# Patient Record
Sex: Female | Born: 1955 | Race: White | Hispanic: No | Marital: Single | Smoking: Never smoker
Health system: Southern US, Community
[De-identification: ages and names within clinical notes are randomized; demographics above are authoritative.]

## PROBLEM LIST (undated history)

## (undated) DIAGNOSIS — E781 Pure hyperglyceridemia: Secondary | ICD-10-CM

## (undated) DIAGNOSIS — K859 Acute pancreatitis without necrosis or infection, unspecified: Secondary | ICD-10-CM

## (undated) DIAGNOSIS — M1990 Unspecified osteoarthritis, unspecified site: Secondary | ICD-10-CM

## (undated) DIAGNOSIS — K922 Gastrointestinal hemorrhage, unspecified: Secondary | ICD-10-CM

## (undated) DIAGNOSIS — E785 Hyperlipidemia, unspecified: Secondary | ICD-10-CM

## (undated) DIAGNOSIS — I5032 Chronic diastolic (congestive) heart failure: Secondary | ICD-10-CM

## (undated) DIAGNOSIS — I10 Essential (primary) hypertension: Secondary | ICD-10-CM

## (undated) DIAGNOSIS — I4891 Unspecified atrial fibrillation: Secondary | ICD-10-CM

## (undated) HISTORY — DX: Unspecified osteoarthritis, unspecified site: M19.90

## (undated) HISTORY — DX: Pure hyperglyceridemia: E78.1

## (undated) HISTORY — DX: Acute pancreatitis without necrosis or infection, unspecified: K85.90

## (undated) HISTORY — DX: Unspecified atrial fibrillation: I48.91

## (undated) HISTORY — PX: LUNG SURGERY: SHX703

## (undated) SURGERY — Surgical Case
Anesthesia: *Unknown

---

## 2003-08-14 ENCOUNTER — Encounter: Admission: RE | Admit: 2003-08-14 | Discharge: 2003-08-14 | Payer: Self-pay | Admitting: Internal Medicine

## 2004-09-01 ENCOUNTER — Emergency Department (HOSPITAL_COMMUNITY): Admission: EM | Admit: 2004-09-01 | Discharge: 2004-09-01 | Payer: Self-pay | Admitting: Emergency Medicine

## 2004-10-03 ENCOUNTER — Encounter: Admission: RE | Admit: 2004-10-03 | Discharge: 2004-10-03 | Payer: Self-pay | Admitting: Internal Medicine

## 2005-03-24 ENCOUNTER — Inpatient Hospital Stay (HOSPITAL_COMMUNITY): Admission: AD | Admit: 2005-03-24 | Discharge: 2005-04-09 | Payer: Self-pay | Admitting: Internal Medicine

## 2005-03-24 ENCOUNTER — Ambulatory Visit: Payer: Self-pay | Admitting: Dentistry

## 2005-03-25 ENCOUNTER — Ambulatory Visit: Payer: Self-pay | Admitting: Critical Care Medicine

## 2005-03-27 ENCOUNTER — Encounter (INDEPENDENT_AMBULATORY_CARE_PROVIDER_SITE_OTHER): Payer: Self-pay | Admitting: Specialist

## 2005-03-31 ENCOUNTER — Encounter (INDEPENDENT_AMBULATORY_CARE_PROVIDER_SITE_OTHER): Payer: Self-pay | Admitting: *Deleted

## 2005-04-16 ENCOUNTER — Encounter: Admission: RE | Admit: 2005-04-16 | Discharge: 2005-04-16 | Payer: Self-pay | Admitting: Thoracic Surgery

## 2005-04-23 ENCOUNTER — Encounter: Admission: RE | Admit: 2005-04-23 | Discharge: 2005-04-23 | Payer: Self-pay | Admitting: Dentistry

## 2005-04-23 ENCOUNTER — Ambulatory Visit (HOSPITAL_COMMUNITY): Admission: RE | Admit: 2005-04-23 | Discharge: 2005-04-23 | Payer: Self-pay | Admitting: Dentistry

## 2005-04-29 ENCOUNTER — Ambulatory Visit: Payer: Self-pay | Admitting: Dentistry

## 2005-04-29 ENCOUNTER — Ambulatory Visit (HOSPITAL_COMMUNITY): Admission: RE | Admit: 2005-04-29 | Discharge: 2005-04-29 | Payer: Self-pay | Admitting: Dentistry

## 2005-05-26 ENCOUNTER — Ambulatory Visit: Payer: Self-pay | Admitting: Dentistry

## 2005-06-24 ENCOUNTER — Ambulatory Visit: Payer: Self-pay | Admitting: Dentistry

## 2005-07-24 ENCOUNTER — Ambulatory Visit: Payer: Self-pay | Admitting: Dentistry

## 2005-08-05 ENCOUNTER — Ambulatory Visit: Payer: Self-pay | Admitting: Dentistry

## 2005-12-29 ENCOUNTER — Encounter: Admission: RE | Admit: 2005-12-29 | Discharge: 2005-12-29 | Payer: Self-pay | Admitting: Internal Medicine

## 2007-01-04 ENCOUNTER — Encounter: Admission: RE | Admit: 2007-01-04 | Discharge: 2007-01-04 | Payer: Self-pay | Admitting: Internal Medicine

## 2008-01-10 ENCOUNTER — Encounter: Admission: RE | Admit: 2008-01-10 | Discharge: 2008-01-10 | Payer: Self-pay | Admitting: Internal Medicine

## 2009-01-22 ENCOUNTER — Encounter: Admission: RE | Admit: 2009-01-22 | Discharge: 2009-01-22 | Payer: Self-pay | Admitting: Internal Medicine

## 2010-01-28 ENCOUNTER — Encounter: Admission: RE | Admit: 2010-01-28 | Discharge: 2010-01-28 | Payer: Self-pay | Admitting: Internal Medicine

## 2010-07-06 ENCOUNTER — Encounter: Payer: Self-pay | Admitting: Thoracic Surgery

## 2010-11-01 NOTE — Op Note (Signed)
NAMEQUISHA, MABIE               ACCOUNT NO.:  [id number]   MEDICAL RECORD NO.:  [id number]          PATIENT TYPE:  INP   LOCATION:  2020                         FACILITY:  [facility]   PHYSICIAN:  Coralyn Helling, M.D.      DATE OF BIRTH:  [birth year]   DATE OF PROCEDURE:  03/27/2005  DATE OF DISCHARGE:                                 OPERATIVE REPORT   PROCEDURE PERFORMED:  Bronchoscopy.   INDICATIONS FOR THE PROCEDURE:  Lung abscess and empyema.  Rule out  endobronchial lesion.   POSTPROCEDURE DIAGNOSES:  1.  Lung abscess.  2.  Empyema.  3.  No endobronchial lesions seen.   PULMONOLOGIST:  Coralyn Helling, M.D.   DESCRIPTION OF THE PROCEDURE:  The patient was given 4 mg of IV Versed and  50 mg of fentanyl for sedation.  She was given viscous lidocaine for the  nasopharynx and Cetacaine spray for her oropharynx anesthesia.   The bronchoscope was entered through the left naris.  The vocal cords were  visualized and had good mobility.  Four milliliters of lidocaine was  instilled and then the trachea was entered.  The mucosa appeared normal and  no endobronchial lesions were seen.  The mediastinum was visualized and  appeared normal in size.  The left main bronchus was then entered.  The left  upper lingula and lower lobes were visualized.  The mucosa appeared normal  and no endobronchial lesions were seen.   The bronchoscope was then withdrawn and the right main bronchus was then  entered.  The right upper and right middle lobes were visualized, and no  endobronchial lesions were seen.  The mucosa appeared normal.  The right  lower lobe was then visualized and again, there were no endobronchial  lesions seen.  The mucosa appeared normal.  Bronchoalveolar lavage was done  from the right lower lobe as well as cytology brush; and, these specimens  will be sent for microbiology cultures.   There was no bleeding seen.   The patient tolerated the procedure well.   COMPLICATIONS:  There  were no immediate complications.   DISPOSITION:  The patient was taken to the recovery room in good condition.      Coralyn Helling, M.D.  Electronically Signed     VS/MEDQ  D:  03/27/2005  T:  03/28/2005  Job:  [id number]

## 2010-11-01 NOTE — Op Note (Signed)
[facility], [facility] NO.:  [id number]   MEDICAL RECORD NO.:  [id number]           PATIENT TYPE:   LOCATION:                                 FACILITY:   PHYSICIAN:  Charlynne Pander, D.D.S.DATE OF BIRTH:  1955-06-26   DATE OF PROCEDURE:  04/29/2005  DATE OF DISCHARGE:                                 OPERATIVE REPORT   PREOPERATIVE DIAGNOSES:  1.  History of lung abscess.  2.  Chronic apical periodontitis.  3.  Chronic periodontitis.  4.  Accretions.  5.  Multiple retained root segments.   POSTOPERATIVE DIAGNOSES:  1.  History of lung abscess.  2.  Chronic apical periodontitis.  3.  Chronic periodontitis.  4.  Accretions.  5.  Multiple retained root segments.  6.  Bilateral Excessive Maxillary Tuberosities.   OPERATIONS:  1.  Extraction of remaining teeth numbers 2, 3, 4, 5, 6, 7, 8, 9, 11, 12,      13, 14, 15, 17, 20, 21, 29 and 32.  2.  Four quadrants of alveoplasty.  3.  Bilateral maxillary osseous tuberosity reductions.  4.  Gross debridement of remaining dentition.   SURGEON:  Charlynne Pander, D.D.S.   ASSISTANT:  Emily Farmer (Sales executive).   ANESTHESIA:  General anesthesia via nasoendotracheal tube; Dr. Gypsy Balsam  attending.   MEDICATIONS:  1.  Ampicillin 2.0 gm IV prior to invasive dental procedures.  2.  Local anesthesia with a total utilization of 5 carpules, each,      containing 36 mg of Xylocaine with 0.018 mg of epinephrine; as well as 2      carpules, each, containing 9 mg of bupivacaine with 0.009 mg of      epinephrine.   SPECIMENS:  There were 18 teeth which were discarded.   CULTURES:  None.   DRAINS:  None.   COMPLICATIONS:  None.   ESTIMATED BLOOD LOSS:  Less than 100 mL.   FLUIDS:  900 mL of lactated Ringers solution.   INDICATIONS:  The patient had a history of lung abscess. Dental consultation  requested to rule out dental infection which may be causing the lung  abscess. The patient was examined and  treatment planned for multiple  extractions with alveoloplasty with gross debridement of remaining  dentition. This treatment plan was formulated to decrease the risk and  complications associated with dental infection from an affecting the  patient's systemic health.   OPERATIVE FINDINGS:  The patient was examined in operating room #2. The  teeth were identified for extraction. The patient is noted to be affected by  chronic apical periodontitis, chronic periodontitis, multiple retained root  segments, bilateral excessive maxillary osseous tuberosities. The  aforementioned, necessitated the removal of multiple teeth with  alveoloplasty, bilateral maxillary osseous tuberosity reductions, and gross  debridement of the remaining teeth.   DESCRIPTION OF PROCEDURE:  The patient was brought to the main operating  room #2. The patient was then placed in the supine position on the operating  room table. General anesthesia was induced, per the anesthesia team, with a  nasoendotracheal tube. The patient was  then prepped and draped in usual  manner for a dental medicine procedure. A throat pack was placed at this  time. The oral cavity was thoroughly examined with the findings as noted  above. The patient was then ready for the oral surgical procedure as  follows:   Local anesthesia was administered sequentially over the 2-hour long  procedure with the total utilization of 5 carpules,  each containing, 36 mg  of Xylocaine with 0.018 mg of epinephrine; as well as 2 carpules, each  containing, 9 mg of bupivacaine with 0.009 mg of epinephrine.   The maxillary quadrants were first approached. A 15-blade incision was made  from the distal of the maxillary right tuberosity, extended to the mesial of  #11.  A surgical flap was then carefully reflected. The upper teeth were  then subluxated with a series of straight elevators. Appropriate amounts of  buccal and interseptal bone was removed. Tooth  numbers 2 and 3 were then  removed with the 53-R forceps without complications. Tooth numbers 4, 5, and  6 were then removed with the 150 forceps without complications.  Appropriate  amounts of buccal and interseptal bone were removed around tooth numbers 7,  8, and 9 and 11. These teeth were then resublaxated and removed with the 150  forceps without complications. Alveoloplasty was then performed utilizing  rongeurs and bone file. An osseous tuberosity reduction was then achieved  utilizing the rongeurs and bone file appropriately. A 15-blade was used to  remove the excessive hyperplastic tissues. The tissues were then further  approximated and trimmed appropriately. Each surgical site was then  irrigated with copious amounts of sterile saline. The surgical site was then  closed, from the axillary right tuberosity and they extended to the mesial  of number 8, utilizing 3-0 chromic gut suture in a continuous interrupted  suture technique x1.   The maxillary left quadrant was then approached, a 15-blade incision was  made from the maxillary left tuberosity and extended to the mesial of #11.  A surgical flap was then further reflected. Appropriate amounts of buccal  and interseptal bone were removed around the remaining teeth. These teeth  were then subluxated and tooth #'s [phone] were removed with a 150  forceps without complications. Alveoloplasty was then performed utilizing  rongeurs and bone file. Further osseous tuberosity reduction was then  achieved with a rongeurs and bone file. The surgical site was then irrigated  with copious amounts of sterile saline. The excessive hyperplastic tissues  were then removed with a 15-blade incision and soft tissue pickups. These  tissues were then further approximated and trimmed appropriately. The  surgical site was then further irrigated with copious amounts of sterile saline. The surgical site was then closed from the maxillary left  tuberosity  and extended to the mesial of #9 utilizing 3-0 chromic gut suture in a  continuous interrupted suture technique x1.   At this point in time, the remaining mandibular teeth were then approached;  bilateral inferior alveolar nerve blocks were given utilizing the  bupivacaine. Further infiltration was achieved appropriately. A 15-blade  incision was made from the distal of #17 and extended to the mesial of #22.  A surgical flap was then carefully reflected. Appropriate amounts of buccal  and interseptal bone were removed around tooth number 17, 20, and 21  appropriately. Tooth #17 was then removed with a 151 forceps without  complications. Tooth numbers 20 and 21 were then removed with a 151 forceps  without complications.  Alveoloplasty was then performed utilizing rongeurs  and bone file. The surgical site was then irrigated with copious amounts of  sterile saline. Soft tissues were trimmed appropriately. The Surgical site  was then closed from the distal of #17 and extended to the distal of #22  utilizing 3-0 chromic gut suture in a continuous interrupted suture  technique x1.   At this point in time, the mandibular right quadrant was approached; a 15-  blade incision was made from distal #32 and extended to the distal of #27.  A surgical flap was then carefully reflected. Appropriate amounts of  interseptal and buccal bone were removed around tooth numbers 29 and 32  appropriately. These teeth were then subluxated and then removed with a 151  forceps without complications. Alveoloplasty was then performed utilizing  rongeurs and bone file. The surgical site was then irrigated with copious  amounts of sterile saline. The soft tissues were approximated and trimmed  appropriately. Surgical site was then closed from the distal of #32 and  extended to the distal of #27 utilizing 3-0 chromic gut suture in a  continuous interrupted suture technique x1.   At this point in time, the  remaining teeth were then approached. A KaVo  sonic scaler was then utilized to remove the significant accretions around  the remaining teeth. A series of hand curettes were then utilized to remove  further accretions. The KaVo sonic scaler was then, again, utilized to  refine and remove the bone accretions. At this point in time, the entire  mouth was irrigated with copious amounts of sterile saline. The patient was  examined for complications, seeing none, the dental medicine procedure was  deemed to be complete. The throat pack was removed at this time. A series of  4x4 gauzes and an oral airway were placed in the mouth, at the request of  the anesthesia team.   The patient was then handed over to the anesthesia team for final  disposition. After the appropriate amount of time the patient was extubated  and taken to the post anesthesia care unit with stable vital signs and a good oxygenation level. All counts were correct for the dental medicine  procedure. The patient will be given appropriate pain medication; and then  will return to clinic in approximately 1 week for evaluation of healing and  suture removal.      Charlynne Pander, D.D.S.  Electronically Signed     RFK/MEDQ  D:  04/29/2005  T:  04/29/2005  Job:  [id number]   cc:   Fleet Contras, M.D.  Fax: [phone]

## 2010-11-01 NOTE — H&P (Signed)
NAMEBRITTIANY, WIEHE               ACCOUNT NO.:  [id number]   MEDICAL RECORD NO.:  [id number]           PATIENT TYPE:   LOCATION:                                 FACILITY:   PHYSICIAN:  Emily Farmer, M.D.    DATE OF BIRTH:  Sep 25, 1955   DATE OF ADMISSION:  03/24/2005  DATE OF DISCHARGE:                                HISTORY & PHYSICAL   HISTORY OF PRESENT ILLNESS:  [name] is a 55 year old Caucasian lady with  past medical history significant for type 2 diabetes mellitus, allergic  rhinitis, and hyperlipidemia.  She was seen in the office of [facility] about a week ago with complaints of cough productive of clear sputum  that was initially dry, associated with some shortness of breath, headaches,  fevers and chills, as well as cold symptoms.  On evaluation in the office  she was noted to be in no acute respiratory or painful distress.  She did  have some scattered rhonchi in her right lung.  A chest x-ray at that time  showed right-sided lower lobe infiltrates.  The patient was advised to be  hospitalized for treatment of pneumonia in view of her comorbid condition of  diabetes.  She declined this request stating that she has a young child with  autism at home to take care of and would rather be treated as an outpatient.  She was therefore started on Levaquin 750 mg one p.o. daily and Zithromax  250 mg to take two the first day and then one everyday for 5 days.  She was  also given Tussionex 5cc q.12 hours for the cough as well as Albuterol MDI  two puffs q.6 hours p.r.n. for shortness of breath.  She was, however,  advised to attend the emergency room if symptoms were to worsen.  I also  scheduled her for a CT scan of her chest to rule out any complications of  this pneumonia and she attended the [facility] for  CT scan this morning and I have been informed by the reading radiologist,  Dr. Dagoberto Farmer that she has a complex pneumonia of the  right lung  associated with some pleural effusion, possibly lung abscess.  I called her  at home this morning and advised her to be hospitalized immediately.  I am  getting a bed available for her at [facility] to be hospitalized  and started on intravenous antibiotics and for further evaluation.   PAST MEDICAL HISTORY:  1.  Type 2 diabetes mellitus.  Her last hemoglobin A1C in June of 2006 was      12.2.  2.  Dyslipidemia.  3.  Allergic rhinitis.  4.  Chronic low back pain.   MEDICATIONS:  1.  Amaryl 4 mg one p.o. b.i.d.  2.  Zyrtec 10 mg daily.  3.  Avecor 20/500 one p.o. b.i.d.  4.  Avandia 40 mg one p.o. b.i.d.  5.  Fortamet 1000 mg one p.o. daily.  6.  Albuterol MDI two puffs q.6 hours p.r.n.  7.  Nasacort AQ one to  two sprays each nostril once a day.  8.  Lantus Insulin 20 units at bedtime.   ALLERGIES:  No known drug allergies.   PAST SURGICAL HISTORY:  Noncontributory.   FAMILY HISTORY:  Her father is alive and well.  Her mother is deceased of  unknown origin.  She has three siblings in good health and one son with  autism.  She denies any use of alcohol, tobacco, or illicit drugs.   REVIEW OF SYSTEMS:  CNS: She denies any headaches, dizziness, blurring of  vision or weakness of extremities.  CVS:  She denies any chest pain,  orthopnea, PND, or palpitations.  GASTROINTESTINAL:  She has no dysphagia,  abdominal pain, heartburn, vomiting, diarrhea, or constipation.  GENITOURINARY:  She has no dysuria, frequency, or hematuria.  MUSCULOSKELETAL:  She does have some aches and pains nonspecific, but no  joint stiffness, joint swelling, or muscle cramps.   PHYSICAL EXAMINATION:  GENERAL:  She is overweight.  She is not in acute  respiratory or painful distress.  VITAL SIGNS:  Blood pressure 130/80, heart rate 96 regular, respiratory rate  15, temperature 98.8.  She weighed 175 pounds with a height of 5 feet 3  inches.  HEENT:  She is normocephalic and  atraumatic.  NECK:  Supple with no elevated JVD, no cervical lymphadenopathy.  CHEST:  Reduced air entry in the right lower lung with few adventitious  sounds.  There are no wheezes or rhonchi.  HEART:  Sounds 1 and 2 are heard with no murmurs, no S3 gallops.  ABDOMEN:  Obese, soft, nontender, no masses.  Bowel sounds are present.  EXTREMITIES:  No edema.  No calf tenderness or swelling.  NEUROLOGY:  Alert and oriented x2 with no focal neurological deficits.   ASSESSMENT:  The patient is a 55 year old Caucasian lady with past medical  history significant for type 2 diabetes mellitus and dyslipidemia.  She  presented about a week ago with right-sided pneumonia which was being  treated in the outpatient setting, but based on the report of a CT scan  performed this morning, it shows nonresolution of her pneumonia and possible  complications with possible lung abscess/pleural effusion.  She is therefore  being hospitalized for intravenous antibiotic therapy and a repeat scan at  the hospital to evaluate these complications.   ADMISSION DIAGNOSES:  1.  Right-sided lung pneumonia, rule out lung abscess.  2.  Type 2 diabetes mellitus, poorly controlled.   PLAN:  She will be admitted to a medical bed.  She will be placed on a 2  gram sodium, carbohydrate-modified diet.  Vital signs q.4 hours.  CBGs a.c.  and h.s.  Further laboratory data will include a CBC, CMET, urinalysis, EKG,  chest x-ray.  PPD test is being done for rule out tuberculosis, sputum for  cultures and sensitivities, and AFB also.  She will be started on  intravenous half normal saline at 50 mL an hour, IV Zosyn 3.375 grams q.6  hours, sliding scale NovoLog Insulin.  Oral medications will be continued  except for Metformin which will be put on hold.  She will be continued on  Lantus Insulin 20 units at bedtime. This plan of care has been explained to  her and all of her questions have been answered.      Emily [facility],  M.D. Electronically Signed     EA/MEDQ  D:  03/24/2005  T:  03/24/2005  Job:  [id number]

## 2010-11-01 NOTE — Op Note (Signed)
[facility], Emily Farmer               ACCOUNT NO.:  [id number]   MEDICAL RECORD NO.:  [id number]          PATIENT TYPE:  INP   LOCATION:  2550                         FACILITY:  [facility]   PHYSICIAN:  Ines Bloomer, M.D. DATE OF BIRTH:  1955/06/18   DATE OF PROCEDURE:  03/31/2005  DATE OF DISCHARGE:                                 OPERATIVE REPORT   PREOPERATIVE DIAGNOSIS:  Right lower lobe abscess with empyema.   POSTOPERATIVE DIAGNOSIS:  Right lower lobe abscess with empyema.   OPERATION PERFORMED:  Right video assisted thoracoscopic surgery with  decortication and resection of right lower lobe abscess.   SURGEON:  Ines Bloomer, M.D.   ASSISTANT:  Stephanie Acre Dominick, PA   ANESTHESIA:  General.   DESCRIPTION OF PROCEDURE:  After percutaneous insertion of all monitoring  lines, the patient underwent general anesthesia, he was turned to the right  lateral thoracotomy position, was prepped and draped in the usual sterile  manner.  A dual lumen tube was inserted and the right lung was deflated.  Two trocar sites were made in the anterior and posterior and posterior  axillary lines at the seventh intercostal space.  Two trocars were inserted.  There was a marked amount of adhesions there and we had to take down the  adhesions with sharp and blunt dissection in order to get the trocars in.  A  0 degree scope was inserted and there was evidence of an empyema in the  posterior and lateral and posterolateral area.  The lung was taken off the  chest wall with sharp and blunt dissection.  Obviously, it was really stuck,  particularly in the area where the abscess was.  The abscess was entered and  cultures were taken from the abscess.  It was decided to do a posterolateral  thoracotomy over the sixth intercostal space.  The latissimus was partially  divided.  The serratus was reflected anteriorly.  The sixth intercostal  space was entered and the lung was taken off the chest wall both  laterally  and then medially. It was very stuck right at the major fissure.  It was  taken off the diaphragm.  It was also stuck on the diaphragm and we had to  take part of it off using the EZ-45 stapler.  Then it was dissected.  The  middle lobe was also dissected off the diaphragm and the pericardium  medially.  Then the scope was reinserted and we were able to free the lung  up superiorly completely freeing up the left upper lobe.  After all this had  been done, attention was turned to the anterior segment of the right middle  lobe and a decortication was done, stripping off the thickened pleura off  the lung.  Then in the superior segment, the area of the abscess, the  abscess was unroofed and partially resected with the EZ-45 stapler and any  bleeding was oversewn with 3-0 Vicryl.  After all the decortication had been  completed.  A Marcaine block was done in the usual fashion. The On-Q  catheter was inserted in the  usual fashion inserting it in the subpleural  area using the tunneler under direct vision with the scope.  After it had  been secured in place with Steri-Strips, two straight 28 chest tubes were  brought in through the trocar sites and a third chest tube and a right  angled chest tube were placed in the right posterior  sulcus and sutured in place with 0 silk.  The chest was closed with three  pericostals, #1 Vicryl in the muscle layer, 2-0 Vicryl in the subcutaneous  tissue and Ethicon skin clips.  The patient was returned to the recovery  room in stable condition.           ______________________________  Ines Bloomer, M.D.     DPB/MEDQ  D:  03/31/2005  T:  03/31/2005  Job:  [id number]   cc:   Shan Levans, M.D. [facility]  [address]  [place]  [place] [address]
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
## 2010-11-01 NOTE — Discharge Summary (Signed)
Emily Farmer, Emily Farmer NO.:  [id number]   MEDICAL RECORD NO.:  [id number]          PATIENT TYPE:  INP   LOCATION:  2032                         FACILITY:  [facility]   PHYSICIAN:  Fleet Contras, M.D.    DATE OF BIRTH:  March 24, 1956   DATE OF ADMISSION:  03/24/2005  DATE OF DISCHARGE:  04/09/2005                                 DISCHARGE SUMMARY   HISTORY OF PRESENT ILLNESS:  Emily Farmer is a 55 year old Caucasian lady with  past medical history significant for type 2 diabetes mellitus.  She was  admitted directly from home after she had an episode of pneumonia in the  right lower lobe which was treated in the outpatient, but a CT scan of the  chest performed at [facility] showed right-sided lung abscess/effusion  and she was thought to be unstable for outpatient management.  She had been  treated in the outpatient with oral Levaquin 750 mg b.i.d. for a week as  well as Azithromycin 500 mg for day #1 and 250 mg daily for four more days.  She did still have some cough with slightly scanty yellow sputum but she did  not have any fever or chills.  She denied any chest pain, shortness of  breath or hemoptysis.   HOSPITAL COURSE:  On admission to the hospital, patient's vital signs were  stable.  She did not have any leukocytosis.  A repeat chest x-ray did  confirm right lower lobe opacities.  A pulmonary consult was requested and  patient was seen by Dr. Shan Levans who suggested that the patient needed  to have bronchoscopy/thoracotomy with decortication of the abscess.  He  arranged for a cardiothoracic surgery consult.  Patient was seen by Dr.  Edwyna Shell who suggested that patient should be given a good trial of  antibiotics and also have a preceding bronchoscopy before thoracotomy.  Patient was started on intravenous antibiotics with Zosyn 4.5 g q.6h.  This  she continued for about 14 days.  In the meantime, she underwent  bronchoscopy performed by Dr. Craige Cotta which did not  reveal any intrabronchial  lesions or obstructions.  Subsequently repeat chest x-ray showed persistent  right lower lobe consolidation with empyema and effusion.  Patient,  therefore, underwent video assisted thoracotomy, decortication an chest tube  drainage performed on March 31, 2005.  This, she tolerated well.  She was  subsequently transferred to step-down unit.  The chest tube was gradually  removed by Dr. Edwyna Shell over the course of a week.  Pain management was  maintained with PCA pump which the patient tolerated.  Prior to this  hospitalization, patient was on insulin, Lantus, which was adjusted to  maintain her CBGs as well as sliding scale NovoLog insulin.  She has  continued to make improvement.  Cultures obtained prior to surgery and after  surgery for bacteria, fungus, acid-fast bacilli all returned negative.  Pathology of the surgery did show acute fibrinous pleuritis with underlying  lung tissue and acute organizing pneumonia.  Repeat chest x-ray performed  April 08, 2005, showed stable postoperative changes on the right with  some  volume loss and no significant change in the right pleural parenchyma  opacities.  Today, patient is sitting up in bed, not in acute respiratory or  painful distress.  She is not pale.  She is not icteric.  She is not  cyanotic.  Vital signs show blood pressure 116/68, heart rate 91,  temperature 97.9, O2 saturation on room air at 93%, respirations 20.  Her  neck is supple with elevated JVD.  Chest shows reduced air entry on the  right lower lung with no wheezing, rhonchi or rales.  Abdomen is soft and  nontender.  Bowel sounds are present.  Extremities show no edema or calf  tenderness or swelling.   RECENT LABORATORY DATA:  CBC of April 07, 2005, showed white count 7.8,  hemoglobin 8.8, hematocrit 26.1, platelet count of 324.  Sodium 138,  potassium 3.5, chloride 102, bicarbonate 27, BUN of 2, creatinine 0.5,  glucose 110.   ASSESSMENT:   Emily Farmer is a 55 year old Caucasian lady admitted directly from  home to the hospital for right lower lobe pneumonia complicated by lung  abscess and pleural effusion.  She has undergone bronchoscopy which was  negative for obstructive bronchial lesion.  She also underwent thoracotomy  with chest tube drainage after decortication of the abscess collection.  She  has had an uneventful postoperative recovery and her condition remains  stable.  She is now on oral antibiotics and her pain control is with oral  agents.  She is considered stable for discharge home today.   DISCHARGE DIAGNOSES:  1.  Right lower lobe pneumonia.  2.  Right lower lung abscess.  3.  Right-sided pleural effusion.  4.  Type 2 diabetes mellitus.  5.  Dental caries.  Please note that patient was seen by Dr. Kristin Bruins,      dentist, for evaluation of her dental caries and he has agreed that      patient is to be followed up as outpatient with an orthopantogram and      further treatment.   DISCHARGE MEDICATIONS:  1.  Augmentin 875 mg b.i.d. for a few more days.  2.  Lantus insulin 25 units nightly.  3.  Amaryl 4 mg daily.  4.  Percocet 5/325 mg one q.6h. p.r.n.   This plan of care has been discussed with her and all of her questions have  been answered.   FOLLOW UP:  With me in one week and also with the dentist in one to two  weeks.      Fleet Contras, M.D.  Electronically Signed     EA/MEDQ  D:  04/09/2005  T:  04/09/2005  Job:  [id number]

## 2011-06-19 ENCOUNTER — Inpatient Hospital Stay (HOSPITAL_COMMUNITY)
Admission: EM | Admit: 2011-06-19 | Discharge: 2011-06-22 | DRG: 638 | Disposition: A | Discharge status: Home / Self Care | Payer: Medicaid Other | Source: Ambulatory Visit | Attending: Internal Medicine | Admitting: Internal Medicine

## 2011-06-19 ENCOUNTER — Inpatient Hospital Stay (HOSPITAL_COMMUNITY): Payer: Medicaid Other

## 2011-06-19 ENCOUNTER — Encounter: Payer: Self-pay | Admitting: Emergency Medicine

## 2011-06-19 DIAGNOSIS — N390 Urinary tract infection, site not specified: Secondary | ICD-10-CM | POA: Diagnosis present

## 2011-06-19 DIAGNOSIS — E1110 Type 2 diabetes mellitus with ketoacidosis without coma: Secondary | ICD-10-CM

## 2011-06-19 DIAGNOSIS — Z794 Long term (current) use of insulin: Secondary | ICD-10-CM

## 2011-06-19 DIAGNOSIS — E871 Hypo-osmolality and hyponatremia: Secondary | ICD-10-CM | POA: Diagnosis present

## 2011-06-19 DIAGNOSIS — I10 Essential (primary) hypertension: Secondary | ICD-10-CM | POA: Diagnosis present

## 2011-06-19 DIAGNOSIS — E1010 Type 1 diabetes mellitus with ketoacidosis without coma: Principal | ICD-10-CM | POA: Diagnosis present

## 2011-06-19 DIAGNOSIS — M109 Gout, unspecified: Secondary | ICD-10-CM | POA: Diagnosis present

## 2011-06-19 HISTORY — DX: Essential (primary) hypertension: I10

## 2011-06-19 LAB — GLUCOSE, CAPILLARY
Glucose-Capillary: 475 mg/dL — ABNORMAL HIGH (ref 70–99)
Glucose-Capillary: 355 mg/dL — ABNORMAL HIGH (ref 70–99)
Glucose-Capillary: 312 mg/dL — ABNORMAL HIGH (ref 70–99)

## 2011-06-19 LAB — URINALYSIS, ROUTINE W REFLEX MICROSCOPIC
Bilirubin Urine: NEGATIVE
Glucose, UA: >1000 mg/dL — AB
Specific Gravity, Urine: 1.029 (ref 1.005–1.030)
Urobilinogen, UA: 0.2 mg/dL (ref 0.0–1.0)
pH: 5.5 (ref 5.0–8.0)

## 2011-06-19 LAB — CBC
HCT: 35 % — ABNORMAL LOW (ref 36.0–46.0)
Hemoglobin: 11.4 g/dL — ABNORMAL LOW (ref 12.0–15.0)
MCH: 27.3 pg (ref 26.0–34.0)
MCHC: 32.6 g/dL (ref 30.0–36.0)
MCV: 83.7 fL (ref 78.0–100.0)
Platelets: 163 10*3/uL (ref 150–400)
RBC: 4.46 MIL/uL (ref 3.87–5.11)
WBC: 10.3 10*3/uL (ref 4.0–10.5)
WBC: 8.9 10*3/uL (ref 4.0–10.5)

## 2011-06-19 LAB — DIFFERENTIAL
Eosinophils Absolute: 0 10*3/uL (ref 0.0–0.7)
Lymphocytes Relative: 6 % — ABNORMAL LOW (ref 12–46)
Lymphs Abs: 0.6 10*3/uL — ABNORMAL LOW (ref 0.7–4.0)
Neutrophils Relative %: 78 % — ABNORMAL HIGH (ref 43–77)

## 2011-06-19 LAB — COMPREHENSIVE METABOLIC PANEL
ALT: 22 U/L (ref 0–35)
AST: 31 U/L (ref 0–37)
Alkaline Phosphatase: 106 U/L (ref 39–117)
CO2: 11 mEq/L — ABNORMAL LOW (ref 19–32)
GFR calc Af Amer: >90 mL/min (ref >90)
Glucose, Bld: 514 mg/dL — ABNORMAL HIGH (ref 70–99)
Potassium: 3.8 mEq/L (ref 3.5–5.1)
Sodium: 126 mEq/L — ABNORMAL LOW (ref 135–145)
Total Protein: 7.5 g/dL (ref 6.0–8.3)

## 2011-06-19 LAB — CARDIAC PANEL(CRET KIN+CKTOT+MB+TROPI)
CK, MB: 2.2 ng/mL (ref 0.3–4.0)
Relative Index: INVALID (ref 0.0–2.5)
Total CK: 59 U/L (ref 7–177)
Troponin I: <0.3 ng/mL (ref <0.30)

## 2011-06-19 LAB — POCT I-STAT 3, VENOUS BLOOD GAS (G3P V)
Acid-base deficit: 12 mmol/L — ABNORMAL HIGH (ref 0.0–2.0)
pH, Ven: 7.336 — ABNORMAL HIGH (ref 7.250–7.300)

## 2011-06-19 LAB — MAGNESIUM: Magnesium: 1.4 mg/dL — ABNORMAL LOW (ref 1.5–2.5)

## 2011-06-19 LAB — URINE MICROSCOPIC-ADD ON

## 2011-06-19 LAB — PHOSPHORUS: Phosphorus: 1.7 mg/dL — ABNORMAL LOW (ref 2.3–4.6)

## 2011-06-19 MED ORDER — ACETAMINOPHEN 325 MG PO TABS
650.0000 mg | ORAL_TABLET | Freq: Four times a day (QID) | ORAL | Status: DC | PRN
  Administered 2011-06-20 – 2011-06-22 (×7): 650 mg via ORAL
  Filled 2011-06-19 (×7): qty 2

## 2011-06-19 MED ORDER — SODIUM CHLORIDE 0.9 % IV BOLUS (SEPSIS)
1000.0000 mL | Freq: Once | INTRAVENOUS | Status: AC
  Administered 2011-06-19: 1000 mL via INTRAVENOUS

## 2011-06-19 MED ORDER — ALUM & MAG HYDROXIDE-SIMETH 200-200-20 MG/5ML PO SUSP
30.0000 mL | Freq: Four times a day (QID) | ORAL | Status: DC | PRN
  Administered 2011-06-20: 30 mL via ORAL
  Filled 2011-06-19 (×2): qty 30

## 2011-06-19 MED ORDER — ONDANSETRON HCL 4 MG/2ML IJ SOLN: 4.0000 mg | Freq: Four times a day (QID) | INTRAMUSCULAR | Status: DC | PRN

## 2011-06-19 MED ORDER — SODIUM CHLORIDE 0.9 % IV SOLN
INTRAVENOUS | Status: DC
  Filled 2011-06-19: qty 1

## 2011-06-19 MED ORDER — MORPHINE SULFATE 2 MG/ML IJ SOLN: 1.0000 mg | INTRAMUSCULAR | Status: DC | PRN

## 2011-06-19 MED ORDER — HYDROCODONE-ACETAMINOPHEN 5-325 MG PO TABS: 1.0000 | ORAL_TABLET | ORAL | Status: DC | PRN

## 2011-06-19 MED ORDER — DOCUSATE SODIUM 100 MG PO CAPS
100.0000 mg | ORAL_CAPSULE | Freq: Two times a day (BID) | ORAL | Status: DC
  Administered 2011-06-20 – 2011-06-22 (×6): 100 mg via ORAL
  Filled 2011-06-19 (×7): qty 1

## 2011-06-19 MED ORDER — INSULIN REGULAR BOLUS VIA INFUSION
0.0000 [IU] | Freq: Three times a day (TID) | INTRAVENOUS | Status: DC
  Filled 2011-06-19 (×5): qty 10

## 2011-06-19 MED ORDER — ACETAMINOPHEN 650 MG RE SUPP: 650.0000 mg | Freq: Four times a day (QID) | RECTAL | Status: DC | PRN

## 2011-06-19 MED ORDER — ZOLPIDEM TARTRATE 5 MG PO TABS: 5.0000 mg | ORAL_TABLET | Freq: Every evening | ORAL | Status: DC | PRN

## 2011-06-19 MED ORDER — SODIUM CHLORIDE 0.9 % IV SOLN
INTRAVENOUS | Status: DC
  Administered 2011-06-19 – 2011-06-20 (×2): via INTRAVENOUS

## 2011-06-19 MED ORDER — SODIUM CHLORIDE 0.9 % IV SOLN: INTRAVENOUS | Status: DC

## 2011-06-19 MED ORDER — ENOXAPARIN SODIUM 40 MG/0.4ML ~~LOC~~ SOLN
40.0000 mg | Freq: Every day | SUBCUTANEOUS | Status: DC
  Administered 2011-06-20 – 2011-06-21 (×3): 40 mg via SUBCUTANEOUS
  Filled 2011-06-19 (×5): qty 0.4

## 2011-06-19 MED ORDER — SODIUM CHLORIDE 0.9 % IV SOLN
INTRAVENOUS | Status: DC
  Administered 2011-06-19: 3 [IU]/h via INTRAVENOUS
  Filled 2011-06-19 (×2): qty 1

## 2011-06-19 MED ORDER — DEXTROSE 50 % IV SOLN: 25.0000 mL | INTRAVENOUS | Status: DC | PRN

## 2011-06-19 MED ORDER — ONDANSETRON HCL 4 MG PO TABS: 4.0000 mg | ORAL_TABLET | Freq: Four times a day (QID) | ORAL | Status: DC | PRN

## 2011-06-19 NOTE — ED Provider Notes (Signed)
Medical screening examination/treatment/procedure(s) were conducted as a shared visit with non-physician practitioner(s) and myself.  I personally evaluated the patient during the encounter   Glynn Octave, MD 06/19/11 Rickey Primus

## 2011-06-19 NOTE — H&P (Signed)
Emily Farmer is an 56 y.o. female.   Chief Complaint: Abdominal pain and dysuria HPI: this is a very pleasant lady who appears older than stated age who was diagnosed with DM two years ago.  She was started on insulin right away and says that her sugars are usually well controlled.  This am she says she had some dysuria and abdominal pain that were sudden and she initially thought that she had a bladder infection but then those symptoms disappeared and she became nauseous but did not vomit.  Came in to get evaluated  Past Medical History  Diagnosis Date  . Diabetes mellitus   . Hypertension   . Gout     Past Surgical History  Procedure Date  . Lung surgery     No family history on file. Social History:  reports that she has never smoked. She does not have any smokeless tobacco history on file. She reports that she does not drink alcohol. Her drug history not on file.  Allergies: No Known Allergies  Medications Prior to Admission  Medication Dose Route Frequency Provider Last Rate Last Dose  . 0.9 %  sodium chloride infusion   Intravenous Continuous Lisette Paz, PA      . dextrose 50 % solution 25 mL  25 mL Intravenous PRN Lisette Paz, PA      . insulin regular (NOVOLIN R,HUMULIN R) 1 Units/mL in sodium chloride 0.9 % 100 mL infusion   Intravenous Continuous Lisette Paz, PA 3 mL/hr at 06/19/11 1819 3 Units/hr at 06/19/11 1819  . insulin regular bolus via infusion 0-10 Units  0-10 Units Intravenous TID WC Lisette Paz, [place]      . sodium chloride 0.9 % bolus 1,000 mL  1,000 mL Intravenous Once Glynn Octave, MD   1,000 mL at 06/19/11 1522  . sodium chloride 0.9 % bolus 1,000 mL  1,000 mL Intravenous Once Lisette Paz, PA   1,000 mL at 06/19/11 1618   No current outpatient prescriptions on file as of 06/19/2011.    Results for orders placed during the hospital encounter of 06/19/11 (from the past 48 hour(s))  CBC     Status: Normal   Collection Time   06/19/11  2:43 PM      Component  Value Range Comment   WBC 10.3  4.0 - 10.5 (K/uL)    RBC 4.46  3.87 - 5.11 (MIL/uL)    Hemoglobin 12.8  12.0 - 15.0 (g/dL)    HCT 16.1  09.6 - 04.5 (%)    MCV 83.4  78.0 - 100.0 (fL)    MCH 28.7  26.0 - 34.0 (pg)    MCHC 34.4  30.0 - 36.0 (g/dL)    RDW 40.9  81.1 - 91.4 (%)    Platelets 163  150 - 400 (K/uL)   DIFFERENTIAL     Status: Abnormal   Collection Time   06/19/11  2:43 PM      Component Value Range Comment   Neutrophils Relative 78 (*) 43 - 77 (%)    Neutro Abs 8.0 (*) 1.7 - 7.7 (K/uL)    Lymphocytes Relative 6 (*) 12 - 46 (%)    Lymphs Abs 0.6 (*) 0.7 - 4.0 (K/uL)    Monocytes Relative 17 (*) 3 - 12 (%)    Monocytes Absolute 1.7 (*) 0.1 - 1.0 (K/uL)    Eosinophils Relative 0  0 - 5 (%)    Eosinophils Absolute 0.0  0.0 - 0.7 (K/uL)    Basophils  Relative 0  0 - 1 (%)    Basophils Absolute 0.0  0.0 - 0.1 (K/uL)   COMPREHENSIVE METABOLIC PANEL     Status: Abnormal   Collection Time   06/19/11  2:43 PM      Component Value Range Comment   Sodium 126 (*) 135 - 145 (mEq/L)    Potassium 3.8  3.5 - 5.1 (mEq/L)    Chloride 91 (*) 96 - 112 (mEq/L)    CO2 11 (*) 19 - 32 (mEq/L)    Glucose, Bld 514 (*) 70 - 99 (mg/dL)    BUN 11  6 - 23 (mg/dL)    Creatinine, Ser 4.09  0.50 - 1.10 (mg/dL)    Calcium 9.6  8.4 - 10.5 (mg/dL)    Total Protein 7.5  6.0 - 8.3 (g/dL)    Albumin 3.1 (*) 3.5 - 5.2 (g/dL)    AST 31  0 - 37 (U/L)    ALT 22  0 - 35 (U/L)    Alkaline Phosphatase 106  39 - 117 (U/L)    Total Bilirubin 0.5  0.3 - 1.2 (mg/dL)    GFR calc non Af Amer >90  >90 (mL/min)    GFR calc Af Amer >90  >90 (mL/min)   LIPASE, BLOOD     Status: Normal   Collection Time   06/19/11  2:43 PM      Component Value Range Comment   Lipase 18  11 - 59 (U/L)   CARDIAC PANEL(CRET KIN+CKTOT+MB+TROPI)     Status: Normal   Collection Time   06/19/11  2:43 PM      Component Value Range Comment   Total CK 59  7 - 177 (U/L)    CK, MB 2.2  0.3 - 4.0 (ng/mL)    Troponin I <0.30  <0.30 (ng/mL)     Relative Index RELATIVE INDEX IS INVALID  0.0 - 2.5    LACTIC ACID, PLASMA     Status: Normal   Collection Time   06/19/11  2:56 PM      Component Value Range Comment   Lactic Acid, Venous 2.1  0.5 - 2.2 (mmol/L)   POCT I-STAT 3, BLOOD GAS (G3P V)     Status: Abnormal   Collection Time   06/19/11  3:06 PM      Component Value Range Comment   pH, Ven 7.336 (*) 7.250 - 7.300     pCO2, Ven 21.1 (*) 45.0 - 50.0 (mmHg)    pO2, Ven 72.0 (*) 30.0 - 45.0 (mmHg)    Bicarbonate 11.3 (*) 20.0 - 24.0 (mEq/L)    TCO2 12  0 - 100 (mmol/L)    O2 Saturation 94.0      Acid-base deficit 12.0 (*) 0.0 - 2.0 (mmol/L)    Sample type VENOUS     GLUCOSE, CAPILLARY     Status: Abnormal   Collection Time   06/19/11  4:11 PM      Component Value Range Comment   Glucose-Capillary 475 (*) 70 - 99 (mg/dL)   URINALYSIS, ROUTINE W REFLEX MICROSCOPIC     Status: Abnormal   Collection Time   06/19/11  4:23 PM      Component Value Range Comment   Color, Urine YELLOW  YELLOW     APPearance HAZY (*) CLEAR     Specific Gravity, Urine 1.029  1.005 - 1.030     pH 5.5  5.0 - 8.0     Glucose, UA >1000 (*) NEGATIVE (mg/dL)  Hgb urine dipstick TRACE (*) NEGATIVE     Bilirubin Urine NEGATIVE  NEGATIVE     Ketones, ur >80 (*) NEGATIVE (mg/dL)    Protein, ur 30 (*) NEGATIVE (mg/dL)    Urobilinogen, UA 0.2  0.0 - 1.0 (mg/dL)    Nitrite NEGATIVE  NEGATIVE     Leukocytes, UA NEGATIVE  NEGATIVE    URINE MICROSCOPIC-ADD ON     Status: Abnormal   Collection Time   06/19/11  4:23 PM      Component Value Range Comment   Squamous Epithelial / LPF FEW (*) RARE     WBC, UA 7-10  <3 (WBC/hpf) HIGH GLUCOSE INTERFERES WITH LEUKOCYTE ESTERASE TEST   RBC / HPF 3-6  <3 (RBC/hpf)    Bacteria, UA FEW (*) RARE    GLUCOSE, CAPILLARY     Status: Abnormal   Collection Time   06/19/11  6:06 PM      Component Value Range Comment   Glucose-Capillary 355 (*) 70 - 99 (mg/dL)    No results found.  Review of Systems  Constitutional: Positive for  chills, malaise/fatigue and diaphoresis. Negative for fever and weight loss.  HENT: Negative for hearing loss, ear pain, nosebleeds, congestion, sore throat, neck pain, tinnitus and ear discharge.   Eyes: Negative for blurred vision, double vision, photophobia, pain, discharge and redness.  Respiratory: Negative for cough, hemoptysis, sputum production, shortness of breath, wheezing and stridor.   Cardiovascular: Negative for chest pain, palpitations, orthopnea, claudication, leg swelling and PND.  Gastrointestinal: Positive for nausea and abdominal pain. Negative for heartburn, vomiting, diarrhea, constipation, blood in stool and melena.  Genitourinary: Positive for dysuria. Negative for urgency, frequency, hematuria and flank pain.  Musculoskeletal: Negative for myalgias, back pain, joint pain and falls.  Skin: Negative for itching and rash.  Neurological: Positive for weakness. Negative for dizziness, tingling, tremors, sensory change, speech change, focal weakness, seizures, loss of consciousness and headaches.  Endo/Heme/Allergies: Negative for environmental allergies and polydipsia. Does not bruise/bleed easily.  Psychiatric/Behavioral: Negative for depression, suicidal ideas, hallucinations, memory loss and substance abuse. The patient is not nervous/anxious and does not have insomnia.     Blood pressure 122/74, pulse 112, temperature 98.8 F (37.1 C), temperature source Oral, resp. rate 20, SpO2 97.00%. Physical Exam  Constitutional: She is oriented to person, place, and time. She appears well-developed and well-nourished. No distress.  HENT:  Head: Normocephalic and atraumatic.  Mouth/Throat: No oropharyngeal exudate.  Eyes: Conjunctivae and EOM are normal. Pupils are equal, round, and reactive to light. Right eye exhibits no discharge. Left eye exhibits no discharge. No scleral icterus.  Neck: Normal range of motion. Neck supple. No JVD present. No tracheal deviation present. No  thyromegaly present.  Cardiovascular: Normal rate, regular rhythm and normal heart sounds.  Exam reveals no gallop and no friction rub.   No murmur heard. Respiratory: Effort normal and breath sounds normal. No stridor. No respiratory distress. She has no wheezes. She has no rales. She exhibits no tenderness.  GI: Soft. Bowel sounds are normal. She exhibits no distension and no mass. There is tenderness. There is no rebound and no guarding.  Musculoskeletal: Normal range of motion. She exhibits no edema and no tenderness.  Lymphadenopathy:    She has no cervical adenopathy.  Neurological: She is alert and oriented to person, place, and time. No cranial nerve deficit. Coordination normal.  Skin: Skin is warm and dry. No rash noted. She is not diaphoretic. No erythema. No pallor.  Psychiatric: She  has a normal mood and affect. Her behavior is normal.     Assessment/Plan 1. DKA- the pt is here for DKA and subsequent pseudohyponatremia for which she will get IVF and IV insulin infusion.  She feels better and at this time will not receive any Abx since no indication Will check her Mg and Phos levels and replete as needed, will check sugars every  hour  Hinda Lindor 06/19/2011, 6:30 PM

## 2011-06-19 NOTE — ED Provider Notes (Signed)
History     CSN: [id number]  Arrival date & time 06/19/11  1425   First MD Initiated Contact with Patient 06/19/11 1437      Chief Complaint  Patient presents with  . Abdominal Pain  . Dizziness  . Chills    (Consider location/radiation/quality/duration/timing/severity/associated sxs/prior treatment) HPI Comments: Patient has a PMH significant for DM and HTN.  She reports that she has had intermittent abdominal pain for the past 2 days. Abdominal pain was located on the RUQ and radiated to the LUQ.  She reports that she is not having any abdominal pain at this time.  No prior history of abdominal surgeries.  She denies any nausea, vomiting, or diarrhea.  Her last BM is 4 days ago.  She denies any fevers.  She reports that she also has been feeling dizzy intermittently.   She is currently on Lantus 60 units at bedtime, glimepiride once daily, and metformin bid for her diabetes.  She reports that she has been taking medication as directed.  Patient is a 56 y.o. female presenting with abdominal pain. The history is provided by the patient.  Abdominal Pain The primary symptoms of the illness include abdominal pain and dysuria. The primary symptoms of the illness do not include fever, shortness of breath, nausea, vomiting, diarrhea, hematemesis, vaginal discharge or vaginal bleeding.  The dysuria is not associated with hematuria, frequency or urgency.  The patient states that she believes she is currently not pregnant. The patient has not had a change in bowel habit. Additional symptoms associated with the illness include chills. Symptoms associated with the illness do not include anorexia, diaphoresis, constipation, urgency, hematuria or frequency.    Past Medical History  Diagnosis Date  . Diabetes mellitus   . Hypertension   . Gout     Past Surgical History  Procedure Date  . Lung surgery     No family history on file.  History  Substance Use Topics  . Smoking status: Never  Smoker   . Smokeless tobacco: Not on file  . Alcohol Use: No    OB History    Grav Para Term Preterm Abortions TAB SAB Ect Mult Living                  Review of Systems  Constitutional: Positive for chills. Negative for fever and diaphoresis.  HENT: Negative for neck pain and neck stiffness.   Respiratory: Negative for shortness of breath.   Cardiovascular: Negative for chest pain.  Gastrointestinal: Positive for abdominal pain. Negative for nausea, vomiting, diarrhea, constipation, anorexia and hematemesis.  Genitourinary: Positive for dysuria. Negative for urgency, frequency, hematuria, vaginal bleeding and vaginal discharge.  Neurological: Positive for dizziness. Negative for seizures, syncope, numbness and headaches.    Allergies  Review of patient's allergies indicates no known allergies.  Home Medications   Current Outpatient Rx  Name Route Sig Dispense Refill  . FLUTICASONE PROPIONATE 50 MCG/ACT NA SUSP Nasal Place 2 sprays into the nose daily.      Marland Kitchen GLIMEPIRIDE 4 MG PO TABS Oral Take 4 mg by mouth daily before breakfast.      . INSULIN GLARGINE 100 UNIT/ML [place] SOLN Subcutaneous Inject 60 Units into the skin at bedtime.      Marland Kitchen METFORMIN HCL 1000 MG PO TABS Oral Take 1,000 mg by mouth 2 (two) times daily with a meal.      . NAPROXEN 500 MG PO TBEC Oral Take 500 mg by mouth 2 (two) times daily as  needed. For inflammation       BP 116/62  Pulse 110  Temp(Src) 98.8 F (37.1 C) (Oral)  Resp 20  SpO2 97%  Physical Exam  Constitutional: She is oriented to person, place, and time. She appears well-developed and well-nourished.  HENT:  Head: Normocephalic and atraumatic.  Neck: Normal range of motion. Neck supple.  Cardiovascular: Normal rate, regular rhythm and normal heart sounds.   Pulmonary/Chest: Effort normal and breath sounds normal.  Abdominal: Soft. Bowel sounds are normal. She exhibits no distension and no mass. There is no tenderness. There is no rebound and  no guarding.  Musculoskeletal: Normal range of motion.  Neurological: She is alert and oriented to person, place, and time.  Skin: Skin is warm and dry. She is not diaphoretic.  Psychiatric: She has a normal mood and affect.    ED Course  Procedures (including critical care time)  Labs Reviewed  POCT I-STAT 3, BLOOD GAS (G3P V) - Abnormal; Notable for the following:    pH, Ven 7.336 (*)    pCO2, Ven 21.1 (*)    pO2, Ven 72.0 (*)    Bicarbonate 11.3 (*)    Acid-base deficit 12.0 (*)    All other components within normal limits  CBC  DIFFERENTIAL  COMPREHENSIVE METABOLIC PANEL  POCT CBG MONITORING  LACTIC ACID, PLASMA  BLOOD GAS, VENOUS  URINALYSIS, ROUTINE W REFLEX MICROSCOPIC  LIPASE, BLOOD  CARDIAC PANEL(CRET KIN+CKTOT+MB+TROPI)   No results found.   No diagnosis found.  Patient signed out to [facility] who assumes care of patient in the ED.  Labs pending.  MDM          Pascal Lux Maralyn Sago 06/19/11 1628

## 2011-06-19 NOTE — ED Provider Notes (Signed)
Medical screening examination/treatment/procedure(s) were conducted as a shared visit with non-physician practitioner(s) and myself.  I personally evaluated the patient during the encounter  Abdominal pain, dysuria, lightheadedness.  Abdomen soft. PH 7.3, HCO3 11.  AG 24 DKA on labs, patient states compliance with insulin, no evidence of infection IVF, insulin gtt  CRITICAL CARE Performed by: Glynn Octave   Total critical care time: 40  Critical care time was exclusive of separately billable procedures and treating other patients.  Critical care was necessary to treat or prevent imminent or life-threatening deterioration.  Critical care was time spent personally by me on the following activities: development of treatment plan with patient and/or surrogate as well as nursing, discussions with consultants, evaluation of patient's response to treatment, examination of patient, obtaining history from patient or surrogate, ordering and performing treatments and interventions, ordering and review of laboratory studies, ordering and review of radiographic studies, pulse oximetry and re-evaluation of patient's condition.   Glynn Octave, MD 06/19/11 Rickey Primus

## 2011-06-19 NOTE — ED Notes (Signed)
[id number] Ready 

## 2011-06-19 NOTE — ED Notes (Signed)
CBG NOTED TO BE 312.

## 2011-06-19 NOTE — ED Notes (Signed)
Rancour, MD handed abnormal lab test results 

## 2011-06-19 NOTE — ED Notes (Signed)
Here with c/o abd pain since tues. Denies nausea,vomiting or fever but states burning on urination

## 2011-06-19 NOTE — ED Provider Notes (Addendum)
4:15 PM Patient care resumed from Emily Farmer.  Patient presented with right upper pattern pain that radiated to the left upper quadrant, dizziness, and dysuria.  Patient is an insulin-controlled diabetic taking Lantus 60 units at 90 glyburide 4 mg twice a day and metformin 1000 twice a day patient states she is taking her medications as directed. Pt states she has been compliant with her medication and denies cough, fever, NS, Chills, CP, SOB, or increase stress.   Patient's point-of-care CBG was 475. Patient's bicarbonate is 11.3 on VBG.  Patient will be receiving fluids to help bring down her glucose and to treat likely diagnosis of DKA. Pt has been started on insulin drip and close CBG monitoring. CMP and UA still pending to confirm DKA.    4:27 PM Anion Gap 24 mEq/L, metabolic acidosis w gap d/t DKA. Potassium WNL. No source of infection.   CMP     Component Value Date/Time   NA 126* 06/19/2011 1443   K 3.8 06/19/2011 1443   CL 91* 06/19/2011 1443   CO2 11* 06/19/2011 1443   GLUCOSE 514* 06/19/2011 1443   BUN 11 06/19/2011 1443   CREATININE 0.63 06/19/2011 1443   CALCIUM 9.6 06/19/2011 1443   PROT 7.5 06/19/2011 1443   ALBUMIN 3.1* 06/19/2011 1443   AST 31 06/19/2011 1443   ALT 22 06/19/2011 1443   ALKPHOS 106 06/19/2011 1443   BILITOT 0.5 06/19/2011 1443   GFRNONAA >90 06/19/2011 1443   GFRAA >90 06/19/2011 1443   CRITICAL CARE Performed by: Jaci Carrel  ?  Total critical care time:  Critical care time was exclusive of separately billable procedures and treating other patients.  Critical care was necessary to treat or prevent imminent or life-threatening deterioration.  Critical care was time spent personally by me on the following activities: development of treatment plan with patient and/or surrogate as well as nursing, discussions with consultants, evaluation of patient's response to treatment, examination of patient, obtaining history from patient or surrogate, ordering and performing  treatments and interventions, ordering and review of laboratory studies, ordering and review of radiographic studies, pulse oximetry and re-evaluation of patient's condition.    Date: 06/19/2011  Rate: 108  Rhythm: sinus tachy  QRS Axis: normal  Intervals: normal  ST/T Wave abnormalities: normal  Conduction Disutrbances:none  Narrative Interpretation:   Old EKG Reviewed: No significant changes noted  5:33 PM Pt to be admitted for DKA. Internal medicine paged. Pt is tachycardic, but stable in NAD. Pt currently has no complaints & understands dx & reason to be admitted. CXR pending for possible infn source.   6:11 PM InPt step down; [facility] Team 4, dx DKA, Dr, Ria Comment, [place] 06/19/11 1811  [place], [place] 06/19/11 2208

## 2011-06-20 LAB — GLUCOSE, CAPILLARY
Glucose-Capillary: 267 mg/dL — ABNORMAL HIGH (ref 70–99)
Glucose-Capillary: 239 mg/dL — ABNORMAL HIGH (ref 70–99)
Glucose-Capillary: 159 mg/dL — ABNORMAL HIGH (ref 70–99)
Glucose-Capillary: 126 mg/dL — ABNORMAL HIGH (ref 70–99)
Glucose-Capillary: 128 mg/dL — ABNORMAL HIGH (ref 70–99)
Glucose-Capillary: 119 mg/dL — ABNORMAL HIGH (ref 70–99)
Glucose-Capillary: 128 mg/dL — ABNORMAL HIGH (ref 70–99)
Glucose-Capillary: 310 mg/dL — ABNORMAL HIGH (ref 70–99)
Glucose-Capillary: 247 mg/dL — ABNORMAL HIGH (ref 70–99)

## 2011-06-20 LAB — COMPREHENSIVE METABOLIC PANEL
AST: 27 U/L (ref 0–37)
Albumin: 2.5 g/dL — ABNORMAL LOW (ref 3.5–5.2)
BUN: 9 mg/dL (ref 6–23)
Calcium: 8.3 mg/dL — ABNORMAL LOW (ref 8.4–10.5)
Chloride: 106 mEq/L (ref 96–112)
Creatinine, Ser: 0.5 mg/dL (ref 0.50–1.10)
Total Bilirubin: 0.3 mg/dL (ref 0.3–1.2)
Total Protein: 6.4 g/dL (ref 6.0–8.3)

## 2011-06-20 LAB — CBC
Hemoglobin: 10.9 g/dL — ABNORMAL LOW (ref 12.0–15.0)
MCH: 28.8 pg (ref 26.0–34.0)
MCV: 82.3 fL (ref 78.0–100.0)
Platelets: 135 10*3/uL — ABNORMAL LOW (ref 150–400)
RBC: 3.78 MIL/uL — ABNORMAL LOW (ref 3.87–5.11)
WBC: 8.6 10*3/uL (ref 4.0–10.5)

## 2011-06-20 LAB — HEMOGLOBIN A1C: Hgb A1c MFr Bld: 14 % — ABNORMAL HIGH (ref <5.7)

## 2011-06-20 LAB — TSH: TSH: 0.316 u[IU]/mL — ABNORMAL LOW (ref 0.350–4.500)

## 2011-06-20 MED ORDER — INSULIN GLARGINE 100 UNIT/ML ~~LOC~~ SOLN
30.0000 [IU] | Freq: Every day | SUBCUTANEOUS | Status: DC
  Administered 2011-06-20: 30 [IU] via SUBCUTANEOUS
  Filled 2011-06-20: qty 3

## 2011-06-20 MED ORDER — INSULIN GLARGINE 100 UNIT/ML ~~LOC~~ SOLN
30.0000 [IU] | Freq: Once | SUBCUTANEOUS | Status: AC
  Administered 2011-06-20: 30 [IU] via SUBCUTANEOUS
  Filled 2011-06-20: qty 3

## 2011-06-20 MED ORDER — POTASSIUM CHLORIDE CRYS ER 20 MEQ PO TBCR
40.0000 meq | EXTENDED_RELEASE_TABLET | ORAL | Status: AC
  Administered 2011-06-20 (×2): 40 meq via ORAL
  Filled 2011-06-20 (×2): qty 2

## 2011-06-20 MED ORDER — SODIUM CHLORIDE 0.9 % IV SOLN
INTRAVENOUS | Status: DC
  Administered 2011-06-20: 12:00:00 via INTRAVENOUS

## 2011-06-20 MED ORDER — INSULIN ASPART 100 UNIT/ML ~~LOC~~ SOLN
0.0000 [IU] | Freq: Three times a day (TID) | SUBCUTANEOUS | Status: DC
Start: 2011-06-20 — End: 2011-06-22
  Administered 2011-06-20: 8 [IU] via SUBCUTANEOUS
  Administered 2011-06-20: 11 [IU] via SUBCUTANEOUS
  Administered 2011-06-21: 8 [IU] via SUBCUTANEOUS
  Administered 2011-06-21: 3 [IU] via SUBCUTANEOUS
  Administered 2011-06-21 – 2011-06-22 (×2): 5 [IU] via SUBCUTANEOUS
  Filled 2011-06-20: qty 3

## 2011-06-20 NOTE — Progress Notes (Signed)
06/20/11  Spoke with patient about her diabetes.  Was diagnosed with diabetes about 3 years ago.  Takes Lantus 60 units daily at home.  Checks CBGs at home several times a day with usual results of 135-200 mg/dl.  States that HgbA1C runs about 8 %.  Seems very knowledgeable about how to care for her diabetes.    May need to increase Lantus dosage to home dose for discharge. Will continue to follow while in hospital. Smith Mince RN BSN

## 2011-06-20 NOTE — Progress Notes (Signed)
Utilization review completed. Emily Farmer 06/20/2011 

## 2011-06-20 NOTE — ED Provider Notes (Signed)
Medical screening examination/treatment/procedure(s) were conducted as a shared visit with non-physician practitioner(s) and myself.  I personally evaluated the patient during the encounter   Lezlee Gills, MD 06/20/11 0639 

## 2011-06-20 NOTE — Progress Notes (Signed)
PATIENT DETAILS Name: Emily Farmer Age: 56 y.o. Sex: female Date of Birth: Nov 29, 1955 Admit Date: 06/19/2011 PCP:No primary provider on file.  Subjective: Feels much better, off insulin drip.  Objective: Vital signs in last 24 hours: Filed Vitals:   06/20/11 0500 06/20/11 0752 06/20/11 1144 06/20/11 1613  BP: 110/61 111/50 110/55 97/54  Pulse: 83 97 93 87  Temp:  100.6 F (38.1 C) 99.6 F (37.6 C) 99.7 F (37.6 C)  TempSrc:  Oral Oral Oral  Resp: 25 26 29 17   Height:      Weight:      SpO2: 97% 100% 95% 96%    Weight change:   Body mass index is 24.94 kg/(m^2).  Intake/Output from previous day:  Intake/Output Summary (Last 24 hours) at 06/20/11 1747 Last data filed at 06/20/11 1621  Gross per 24 hour  Intake    750 ml  Output   1000 ml  Net   -250 ml    PHYSICAL EXAM: Gen Exam: Awake and alert with clear speech.   Neck: Supple, No JVD.   Chest: B/L Clear.  CVS: S1 S2 Regular, no murmurs.  Abdomen: soft, BS +, non tender, non distended.  Extremities: no edema, lower extremities warm to touch Neurologic: Non Focal.  Skin: No Rash.   Wounds: N/A.    CONSULTS:  none  LAB RESULTS: CBC  Lab 06/20/11 0425 06/19/11 2210 06/19/11 1443  WBC 8.6 8.9 10.3  HGB 10.9* 11.4* 12.8  HCT 31.1* 35.0* 37.2  PLT 135* 158 163  MCV 82.3 83.7 83.4  MCH 28.8 27.3 28.7  MCHC 35.0 32.6 34.4  RDW 13.7 13.6 13.4  LYMPHSABS -- -- 0.6*  MONOABS -- -- 1.7*  EOSABS -- -- 0.0  BASOSABS -- -- 0.0  BANDABS -- -- --    Chemistries   Lab 06/20/11 0425 06/19/11 2210 06/19/11 1443  NA 135 -- 126*  K 2.9* -- 3.8  CL 106 -- 91*  CO2 17* -- 11*  GLUCOSE 128* -- 514*  BUN 9 -- 11  CREATININE 0.50 0.51 0.63  CALCIUM 8.3* -- 9.6  MG -- 1.4* --    GFR Estimated Creatinine Clearance: 72.3 ml/min (by C-G formula based on Cr of 0.5).  Coagulation profile No results found for this basename: INR:5,PROTIME:5 in the last 168 hours  Cardiac Enzymes  Lab 06/19/11 1443  CKMB  2.2  TROPONINI <0.30  MYOGLOBIN --    No components found with this basename: POCBNP:3 No results found for this basename: DDIMER:2 in the last 72 hours  Basename 06/19/11 2210  HGBA1C 14.0*   No results found for this basename: CHOL:2,HDL:2,LDLCALC:2,TRIG:2,CHOLHDL:2,LDLDIRECT:2 in the last 72 hours  Basename 06/19/11 2210  TSH 0.316*  T4TOTAL --  T3FREE --  THYROIDAB --   No results found for this basename: VITAMINB12:2,FOLATE:2,FERRITIN:2,TIBC:2,IRON:2,RETICCTPCT:2 in the last 72 hours  Basename 06/19/11 1443  LIPASE 18  AMYLASE --    Urine Studies No results found for this basename: UACOL:2,UAPR:2,USPG:2,UPH:2,UTP:2,UGL:2,UKET:2,UBIL:2,UHGB:2,UNIT:2,UROB:2,ULEU:2,UEPI:2,UWBC:2,URBC:2,UBAC:2,CAST:2,CRYS:2,UCOM:2,BILUA:2 in the last 72 hours  MICROBIOLOGY: Recent Results (from the past 240 hour(s))  MRSA PCR SCREENING     Status: Normal   Collection Time   06/19/11  8:54 PM      Component Value Range Status Comment   MRSA by PCR NEGATIVE  NEGATIVE  Final     RADIOLOGY STUDIES/RESULTS: Dg Chest 2 View  06/19/2011  *RADIOLOGY REPORT*  Clinical Data: Cough, shortness of breath.  CHEST - 2 VIEW  Comparison: 04/16/2005  Findings: Postoperative changes in the right  lower chest with areas of scarring.  No acute opacities or effusions.  Heart is normal size.  No acute bony abnormality.  IMPRESSION: Right base scar.  No acute findings.  Original Report Authenticated By: Cyndie Chime, M.D.    MEDICATIONS: Scheduled Meds:   . docusate sodium  100 mg Oral BID  . enoxaparin  40 mg Subcutaneous QHS  . insulin aspart  0-15 Units Subcutaneous TID WC  . insulin glargine  30 Units Subcutaneous Once  . insulin glargine  30 Units Subcutaneous QHS  . potassium chloride  40 mEq Oral Q4H  . sodium chloride  1,000 mL Intravenous Once  . DISCONTD: insulin regular  0-10 Units Intravenous TID WC   Continuous Infusions:   . sodium chloride 50 mL/hr at 06/20/11 1220  . DISCONTD: sodium  chloride 150 mL/hr at 06/20/11 0342  . DISCONTD: sodium chloride    . DISCONTD: insulin (NOVOLIN-R) infusion 3 Units/hr (06/19/11 1819)  . DISCONTD: insulin (NOVOLIN-R) infusion     PRN Meds:.acetaminophen, acetaminophen, alum & mag hydroxide-simeth, dextrose, HYDROcodone-acetaminophen, morphine, ondansetron (ZOFRAN) IV, ondansetron, zolpidem  Antibiotics: Anti-infectives    None      Assessment/Plan: Patient Active Hospital Problem List:  diabetic ketoacidosis -Resolved, will give another 30 units of Lantus this evening, plan to resume usual dosing of Lantus tomorrow. -Will also order for sliding scale insulin and NovoLog pre-meal coverage. -We'll continue to adjust her insulin regimen depending on CBG results.  UTI -Start ciprofloxacin and obtain urine cultures.  Disposition: Transfer to regular medical bed  DVT Prophylaxis: Lovenox  Code Status: Full code   Maretta Bees,  MD. 06/20/2011, 5:47 PM

## 2011-06-20 NOTE — Progress Notes (Signed)
Pt sent to [id number]. Report given top Tameka, Charity fundraiser.

## 2011-06-20 NOTE — Progress Notes (Signed)
MD notified, made aware of last four CBG values:  128, 119, 128, and 120.  MD states that it is ok to keep patient on GlucoStabilizer until he arrives for rounding.  Current insulin rate at 0units/hr per glucostabilizer.

## 2011-06-21 LAB — GLUCOSE, CAPILLARY
Glucose-Capillary: 172 mg/dL — ABNORMAL HIGH (ref 70–99)
Glucose-Capillary: 221 mg/dL — ABNORMAL HIGH (ref 70–99)
Glucose-Capillary: 283 mg/dL — ABNORMAL HIGH (ref 70–99)

## 2011-06-21 LAB — CBC
MCV: 81.6 fL (ref 78.0–100.0)
Platelets: 138 10*3/uL — ABNORMAL LOW (ref 150–400)
RBC: 3.74 MIL/uL — ABNORMAL LOW (ref 3.87–5.11)
RDW: 13.8 % (ref 11.5–15.5)
WBC: 7.9 10*3/uL (ref 4.0–10.5)

## 2011-06-21 LAB — BASIC METABOLIC PANEL
CO2: 18 mEq/L — ABNORMAL LOW (ref 19–32)
Chloride: 102 mEq/L (ref 96–112)
Creatinine, Ser: 0.42 mg/dL — ABNORMAL LOW (ref 0.50–1.10)
GFR calc Af Amer: >90 mL/min (ref >90)
Potassium: 3 mEq/L — ABNORMAL LOW (ref 3.5–5.1)
Sodium: 133 mEq/L — ABNORMAL LOW (ref 135–145)

## 2011-06-21 MED ORDER — GLIMEPIRIDE 4 MG PO TABS
4.0000 mg | ORAL_TABLET | Freq: Every day | ORAL | Status: DC
  Administered 2011-06-22: 4 mg via ORAL
  Filled 2011-06-21 (×2): qty 1

## 2011-06-21 MED ORDER — POTASSIUM CHLORIDE CRYS ER 20 MEQ PO TBCR
40.0000 meq | EXTENDED_RELEASE_TABLET | Freq: Once | ORAL | Status: AC
  Administered 2011-06-21: 40 meq via ORAL
  Filled 2011-06-21: qty 2

## 2011-06-21 MED ORDER — INSULIN GLARGINE 100 UNIT/ML ~~LOC~~ SOLN
60.0000 [IU] | Freq: Every day | SUBCUTANEOUS | Status: DC
  Administered 2011-06-21: 60 [IU] via SUBCUTANEOUS
  Filled 2011-06-21: qty 3

## 2011-06-21 NOTE — Progress Notes (Signed)
PATIENT DETAILS Name: Emily Farmer Age: 56 y.o. Sex: female Date of Birth: 1955/06/30 Admit Date: 06/19/2011 PCP:No primary provider on file.  Subjective: Better  Objective: Vital signs in last 24 hours: Filed Vitals:   06/20/11 2006 06/20/11 2115 06/21/11 0458 06/21/11 1528  BP: 121/60 103/61 99/58 138/88  Pulse: 90 87 92 88  Temp: 100.2 F (37.9 C) 98.1 F (36.7 C) 98.2 F (36.8 C) 97.3 F (36.3 C)  TempSrc: Oral Oral Oral Oral  Resp: 34 30 20 22   Height:      Weight:      SpO2:  96% 94% 96%    Weight change:   Body mass index is 24.94 kg/(m^2).  Intake/Output from previous day:  Intake/Output Summary (Last 24 hours) at 06/21/11 1543 Last data filed at 06/21/11 0700  Gross per 24 hour  Intake    450 ml  Output   1400 ml  Net   -950 ml    PHYSICAL EXAM: Gen Exam: Awake and alert with clear speech.   Neck: Supple, No JVD.   Chest: B/L Clear. No added sounds CVS: S1 S2 Regular, no murmurs. No rub Abdomen: soft, BS +, non tender, non distended.  Extremities: no edema, lower extremities warm to touch Neurologic: Non Focal.  Skin: No Rash.   Wounds: N/A.    CONSULTS:  none  LAB RESULTS: CBC  Lab 06/21/11 0645 06/20/11 0425 06/19/11 2210 06/19/11 1443  WBC 7.9 8.6 8.9 10.3  HGB 10.8* 10.9* 11.4* 12.8  HCT 30.5* 31.1* 35.0* 37.2  PLT 138* 135* 158 163  MCV 81.6 82.3 83.7 83.4  MCH 28.9 28.8 27.3 28.7  MCHC 35.4 35.0 32.6 34.4  RDW 13.8 13.7 13.6 13.4  LYMPHSABS -- -- -- 0.6*  MONOABS -- -- -- 1.7*  EOSABS -- -- -- 0.0  BASOSABS -- -- -- 0.0  BANDABS -- -- -- --    Chemistries   Lab 06/21/11 0645 06/20/11 0425 06/19/11 2210 06/19/11 1443  NA 133* 135 -- 126*  K 3.0* 2.9* -- 3.8  CL 102 106 -- 91*  CO2 18* 17* -- 11*  GLUCOSE 195* 128* -- 514*  BUN 6 9 -- 11  CREATININE 0.42* 0.50 0.51 0.63  CALCIUM 8.1* 8.3* -- 9.6  MG -- -- 1.4* --    GFR Estimated Creatinine Clearance: 72.3 ml/min (by C-G formula based on Cr of  0.42).  Coagulation profile No results found for this basename: INR:5,PROTIME:5 in the last 168 hours  Cardiac Enzymes  Lab 06/19/11 1443  CKMB 2.2  TROPONINI <0.30  MYOGLOBIN --    No components found with this basename: POCBNP:3 No results found for this basename: DDIMER:2 in the last 72 hours  Basename 06/19/11 2210  HGBA1C 14.0*   No results found for this basename: CHOL:2,HDL:2,LDLCALC:2,TRIG:2,CHOLHDL:2,LDLDIRECT:2 in the last 72 hours  Basename 06/19/11 2210  TSH 0.316*  T4TOTAL --  T3FREE --  THYROIDAB --   No results found for this basename: VITAMINB12:2,FOLATE:2,FERRITIN:2,TIBC:2,IRON:2,RETICCTPCT:2 in the last 72 hours  Basename 06/19/11 1443  LIPASE 18  AMYLASE --    Urine Studies No results found for this basename: UACOL:2,UAPR:2,USPG:2,UPH:2,UTP:2,UGL:2,UKET:2,UBIL:2,UHGB:2,UNIT:2,UROB:2,ULEU:2,UEPI:2,UWBC:2,URBC:2,UBAC:2,CAST:2,CRYS:2,UCOM:2,BILUA:2 in the last 72 hours  MICROBIOLOGY: Recent Results (from the past 240 hour(s))  MRSA PCR SCREENING     Status: Normal   Collection Time   06/19/11  8:54 PM      Component Value Range Status Comment   MRSA by PCR NEGATIVE  NEGATIVE  Final     RADIOLOGY STUDIES/RESULTS: Dg Chest 2 View  06/19/2011  *RADIOLOGY REPORT*  Clinical Data: Cough, shortness of breath.  CHEST - 2 VIEW  Comparison: 04/16/2005  Findings: Postoperative changes in the right lower chest with areas of scarring.  No acute opacities or effusions.  Heart is normal size.  No acute bony abnormality.  IMPRESSION: Right base scar.  No acute findings.  Original Report Authenticated By: Cyndie Chime, M.D.    MEDICATIONS: Scheduled Meds:    . docusate sodium  100 mg Oral BID  . enoxaparin  40 mg Subcutaneous QHS  . glimepiride  4 mg Oral QAC breakfast  . insulin aspart  0-15 Units Subcutaneous TID WC  . insulin glargine  60 Units Subcutaneous QHS  . potassium chloride  40 mEq Oral Q4H  . potassium chloride  40 mEq Oral Once  . DISCONTD:  insulin glargine  30 Units Subcutaneous QHS   Continuous Infusions:    . DISCONTD: sodium chloride 50 mL/hr at 06/20/11 1220   PRN Meds:.acetaminophen, acetaminophen, alum & mag hydroxide-simeth, dextrose, HYDROcodone-acetaminophen, morphine, ondansetron (ZOFRAN) IV, ondansetron, zolpidem  Antibiotics: Anti-infectives    None      Assessment/Plan: Patient Active Hospital Problem List:  diabetic ketoacidosis -Resolved, resume is 60 units of Lantus-patient's usual dosing. Monitor for 24 more hours-likely discharge tomorrow morning  UTI - ciprofloxacin and await urine cultures.  Disposition: Remain inpatient-possible d/c in am  DVT Prophylaxis: Lovenox  Code Status: Full code   Maretta Bees,  MD. 06/21/2011, 3:43 PM

## 2011-06-22 MED ORDER — INSULIN GLARGINE 100 UNIT/ML ~~LOC~~ SOLN: 60.0000 [IU] | Freq: Every day | SUBCUTANEOUS | Status: DC

## 2011-06-22 MED ORDER — CIPROFLOXACIN HCL 500 MG PO TABS: 500.0000 mg | ORAL_TABLET | Freq: Two times a day (BID) | ORAL | Status: AC

## 2011-06-22 NOTE — Discharge Summary (Signed)
PATIENT DETAILS Name: Emily Farmer Age: 56 y.o. Sex: female Date of Birth: 20-Apr-1956 MRN: [id number]. Admit Date: 06/19/2011 Admitting Physician: Melene Plan PCP:No primary provider on file.  PRIMARY DISCHARGE DIAGNOSIS:  Active Problems: Diabetic ketoacidosis. Urinary tract infection.      PAST MEDICAL HISTORY: Past Medical History  Diagnosis Date  . Diabetes mellitus   . Hypertension   . Gout     DISCHARGE MEDICATIONS: Discharge Medication List as of 06/22/2011 11:19 AM    START taking these medications   Details  ciprofloxacin (CIPRO) 500 MG tablet Take 1 tablet (500 mg total) by mouth 2 (two) times daily., Starting 06/22/2011, Until Wed 07/02/11, Print      CONTINUE these medications which have CHANGED   Details  insulin glargine (LANTUS) 100 UNIT/ML injection Inject 60 Units into the skin at bedtime., Starting 06/22/2011, Until Discontinued, Print      CONTINUE these medications which have NOT CHANGED   Details  fluticasone (FLONASE) 50 MCG/ACT nasal spray Place 2 sprays into the nose daily.  , Until Discontinued, Historical Med    glimepiride (AMARYL) 4 MG tablet Take 4 mg by mouth daily before breakfast.  , Until Discontinued, Historical Med    metFORMIN (GLUCOPHAGE) 1000 MG tablet Take 1,000 mg by mouth 2 (two) times daily with a meal.  , Until Discontinued, Historical Med    naproxen (EC NAPROSYN) 500 MG EC tablet Take 500 mg by mouth 2 (two) times daily as needed. For inflammation , Until Discontinued, Historical Med         BRIEF HPI:  See H&P, Labs, Consult and Test reports for all details in brief, patient was admitted for abdominal pain. She was found to be in diabetic ketoacidosis and was admitted to the hospitalist service. For further details please see the history and physical that was done on admission.  CONSULTATIONS:   none  PERTINENT RADIOLOGIC STUDIES: Dg Chest 2 View  06/19/2011  *RADIOLOGY REPORT*  Clinical Data: Cough, shortness of  breath.  CHEST - 2 VIEW  Comparison: 04/16/2005  Findings: Postoperative changes in the right lower chest with areas of scarring.  No acute opacities or effusions.  Heart is normal size.  No acute bony abnormality.  IMPRESSION: Right base scar.  No acute findings.  Original Report Authenticated By: Cyndie Chime, M.D.     PERTINENT LAB RESULTS: CBC:  Basename 06/21/11 0645 06/20/11 0425  WBC 7.9 8.6  HGB 10.8* 10.9*  HCT 30.5* 31.1*  PLT 138* 135*   CMET CMP     Component Value Date/Time   NA 133* 06/21/2011 0645   K 3.0* 06/21/2011 0645   CL 102 06/21/2011 0645   CO2 18* 06/21/2011 0645   GLUCOSE 195* 06/21/2011 0645   BUN 6 06/21/2011 0645   CREATININE 0.42* 06/21/2011 0645   CALCIUM 8.1* 06/21/2011 0645   PROT 6.4 06/20/2011 0425   ALBUMIN 2.5* 06/20/2011 0425   AST 27 06/20/2011 0425   ALT 19 06/20/2011 0425   ALKPHOS 83 06/20/2011 0425   BILITOT 0.3 06/20/2011 0425   GFRNONAA >90 06/21/2011 0645   GFRAA >90 06/21/2011 0645    GFR Estimated Creatinine Clearance: 80.8 ml/min (by C-G formula based on Cr of 0.42).  Basename 06/19/11 1443  LIPASE 18  AMYLASE --    Basename 06/19/11 1443  CKTOTAL 59  CKMB 2.2  CKMBINDEX --  TROPONINI <0.30   No components found with this basename: POCBNP:3 No results found for this basename: DDIMER:2 in the last 72  hours  Basename 06/19/11 2210  HGBA1C 14.0*   No results found for this basename: CHOL:2,HDL:2,LDLCALC:2,TRIG:2,CHOLHDL:2,LDLDIRECT:2 in the last 72 hours  Basename 06/19/11 2210  TSH 0.316*  T4TOTAL --  T3FREE --  THYROIDAB --   No results found for this basename: VITAMINB12:2,FOLATE:2,FERRITIN:2,TIBC:2,IRON:2,RETICCTPCT:2 in the last 72 hours Coags: No results found for this basename: PT:2,INR:2 in the last 72 hours Microbiology: Recent Results (from the past 240 hour(s))  MRSA PCR SCREENING     Status: Normal   Collection Time   06/19/11  8:54 PM      Component Value Range Status Comment   MRSA by PCR NEGATIVE  NEGATIVE  Final     URINE CULTURE     Status: Normal (Preliminary result)   Collection Time   06/20/11 12:40 PM      Component Value Range Status Comment   Specimen Description URINE, CLEAN CATCH   Final    Special Requests NONE   Final    Setup Time [id number]   Final    Colony Count >=100,000 COLONIES/ML   Final    Culture ESCHERICHIA COLI   Final    Report Status PENDING   Incomplete      BRIEF HOSPITAL COURSE:   Active Problems: Diabetic ketoacidosis -Patient was admitted to a step down unit, was treated in the standard fashion of intravenous fluids and intravenous insulin per glucose stabilized protocol. Once her anion gap closed insulin infusion was stopped, patient was transitioned over to Lantus. Her home dosing of Lantus has been resumed with moderately well glycemic control. Upon further interviewing the patient and obtaining further history retrospectively, patient claims that she has not been compliant to her insulin, she apparently was trying to take less amounts of insulin to stretch them out out so that it would last longer. Patient's hemoglobin A1c was 14.0. She has been counseled extensively regarding importance of tight glycemic control. She had a sister who was at bedside today came understanding. -She has been provided a prescription for Lantus insulin and has been asked to call and make an appointment with her primary care doctor within a week.  Urinary tract infection -Patient has responded well to ciprofloxacin, her urine cultures show Escherichia coli however the sensitivity is currently pending. Patient has been responding to ciprofloxacin intravenously, and on discharge she will be transitioned over to oral ciprofloxacin.  TODAY-DAY OF DISCHARGE:  Subjective:   Samon Dishner today has no headache,no chest abdominal pain,no new weakness tingling or numbness, feels much better wants to go home today.   Objective:   Blood pressure 110/54, pulse 87, temperature 98.2 F (36.8 C),  temperature source Oral, resp. rate 22, height 5\' 3"  (1.6 m), weight 80.6 kg (177 lb 11.1 oz), SpO2 93.00%.  Intake/Output Summary (Last 24 hours) at 06/22/11 1442 Last data filed at 06/22/11 0600  Gross per 24 hour  Intake    600 ml  Output      3 ml  Net    597 ml    Exam Awake Alert, Oriented *3, No new F.N deficits, Normal affect [place].AT,PERRAL Supple Neck,No JVD, No cervical lymphadenopathy appriciated.  Symmetrical Chest wall movement, Good air movement bilaterally, CTAB RRR,No Gallops,Rubs or new Murmurs, No Parasternal Heave +ve B.Sounds, Abd Soft, Non tender, No organomegaly appriciated, No rebound -guarding or rigidity. No Cyanosis, Clubbing or edema, No new Rash or bruise  DISPOSITION: Home  DISCHARGE INSTRUCTIONS:    Follow-up Information    Follow up with Dorrene German, MD. Make an appointment  in 5 days.   Contact information:   [address] [place] [address] [phone]         Total Time spent on discharge equals 45 minutes.  SignedJeoffrey Massed 06/22/2011 2:42 PM

## 2011-06-23 LAB — URINE CULTURE

## 2013-02-15 ENCOUNTER — Encounter (HOSPITAL_COMMUNITY): Payer: Self-pay | Admitting: *Deleted

## 2013-02-15 ENCOUNTER — Emergency Department (HOSPITAL_COMMUNITY): Payer: Medicaid Other

## 2013-02-15 ENCOUNTER — Inpatient Hospital Stay (HOSPITAL_COMMUNITY)
Admission: EM | Admit: 2013-02-15 | Discharge: 2013-02-19 | DRG: 689 | Disposition: A | Discharge status: Home / Self Care | Payer: Medicaid Other | Attending: Internal Medicine | Admitting: Internal Medicine

## 2013-02-15 DIAGNOSIS — M109 Gout, unspecified: Secondary | ICD-10-CM | POA: Diagnosis present

## 2013-02-15 DIAGNOSIS — R197 Diarrhea, unspecified: Secondary | ICD-10-CM

## 2013-02-15 DIAGNOSIS — E1169 Type 2 diabetes mellitus with other specified complication: Secondary | ICD-10-CM

## 2013-02-15 DIAGNOSIS — Z6829 Body mass index (BMI) 29.0-29.9, adult: Secondary | ICD-10-CM

## 2013-02-15 DIAGNOSIS — N12 Tubulo-interstitial nephritis, not specified as acute or chronic: Secondary | ICD-10-CM

## 2013-02-15 DIAGNOSIS — I2510 Atherosclerotic heart disease of native coronary artery without angina pectoris: Secondary | ICD-10-CM

## 2013-02-15 DIAGNOSIS — E785 Hyperlipidemia, unspecified: Secondary | ICD-10-CM | POA: Diagnosis present

## 2013-02-15 DIAGNOSIS — E876 Hypokalemia: Secondary | ICD-10-CM

## 2013-02-15 DIAGNOSIS — E860 Dehydration: Secondary | ICD-10-CM

## 2013-02-15 DIAGNOSIS — R112 Nausea with vomiting, unspecified: Secondary | ICD-10-CM

## 2013-02-15 DIAGNOSIS — Z79899 Other long term (current) drug therapy: Secondary | ICD-10-CM

## 2013-02-15 DIAGNOSIS — R739 Hyperglycemia, unspecified: Secondary | ICD-10-CM

## 2013-02-15 DIAGNOSIS — E669 Obesity, unspecified: Secondary | ICD-10-CM

## 2013-02-15 DIAGNOSIS — E8729 Other acidosis: Secondary | ICD-10-CM

## 2013-02-15 DIAGNOSIS — E872 Acidosis: Secondary | ICD-10-CM

## 2013-02-15 DIAGNOSIS — I10 Essential (primary) hypertension: Secondary | ICD-10-CM

## 2013-02-15 DIAGNOSIS — E1310 Other specified diabetes mellitus with ketoacidosis without coma: Secondary | ICD-10-CM | POA: Diagnosis present

## 2013-02-15 DIAGNOSIS — E119 Type 2 diabetes mellitus without complications: Secondary | ICD-10-CM

## 2013-02-15 DIAGNOSIS — E781 Pure hyperglyceridemia: Secondary | ICD-10-CM

## 2013-02-15 DIAGNOSIS — E871 Hypo-osmolality and hyponatremia: Secondary | ICD-10-CM

## 2013-02-15 DIAGNOSIS — N10 Acute tubulo-interstitial nephritis: Principal | ICD-10-CM

## 2013-02-15 DIAGNOSIS — Z794 Long term (current) use of insulin: Secondary | ICD-10-CM

## 2013-02-15 HISTORY — DX: Hyperlipidemia, unspecified: E78.5

## 2013-02-15 LAB — CBC WITH DIFFERENTIAL/PLATELET
Basophils Absolute: 0 10*3/uL (ref 0.0–0.1)
Eosinophils Absolute: 0 10*3/uL (ref 0.0–0.7)
Eosinophils Relative: 0 % (ref 0–5)
HCT: 35.7 % — ABNORMAL LOW (ref 36.0–46.0)
Lymphocytes Relative: 12 % (ref 12–46)
MCH: 28.7 pg (ref 26.0–34.0)
MCHC: 35 g/dL (ref 30.0–36.0)
MCV: 82.1 fL (ref 78.0–100.0)
Monocytes Absolute: 1 10*3/uL (ref 0.1–1.0)
Platelets: 172 10*3/uL (ref 150–400)
RDW: 12.6 % (ref 11.5–15.5)
WBC: 6.7 10*3/uL (ref 4.0–10.5)

## 2013-02-15 LAB — COMPREHENSIVE METABOLIC PANEL
BUN: 9 mg/dL (ref 6–23)
Calcium: 9.3 mg/dL (ref 8.4–10.5)
GFR calc Af Amer: >90 mL/min (ref >90)
Glucose, Bld: 424 mg/dL — ABNORMAL HIGH (ref 70–99)
Sodium: 127 mEq/L — ABNORMAL LOW (ref 135–145)
Total Protein: 7.4 g/dL (ref 6.0–8.3)

## 2013-02-15 LAB — URINALYSIS, ROUTINE W REFLEX MICROSCOPIC
Leukocytes, UA: NEGATIVE
Nitrite: NEGATIVE
Specific Gravity, Urine: 1.032 — ABNORMAL HIGH (ref 1.005–1.030)
pH: 6 (ref 5.0–8.0)

## 2013-02-15 LAB — URINE MICROSCOPIC-ADD ON

## 2013-02-15 MED ORDER — SODIUM CHLORIDE 0.9 % IV SOLN
INTRAVENOUS | Status: DC
  Administered 2013-02-15: via INTRAVENOUS

## 2013-02-15 MED ORDER — FENTANYL CITRATE 0.05 MG/ML IJ SOLN
50.0000 ug | Freq: Once | INTRAMUSCULAR | Status: AC
  Administered 2013-02-15: 50 ug via INTRAVENOUS
  Filled 2013-02-15: qty 2

## 2013-02-15 MED ORDER — INSULIN GLARGINE 100 UNIT/ML ~~LOC~~ SOLN
60.0000 [IU] | Freq: Once | SUBCUTANEOUS | Status: AC
  Administered 2013-02-16: 60 [IU] via SUBCUTANEOUS
  Filled 2013-02-15 (×2): qty 0.6

## 2013-02-15 MED ORDER — SODIUM CHLORIDE 0.9 % IV SOLN
INTRAVENOUS | Status: DC
  Administered 2013-02-15: 2.7 [IU]/h via INTRAVENOUS
  Filled 2013-02-15: qty 1

## 2013-02-15 MED ORDER — SODIUM CHLORIDE 0.9 % IV BOLUS (SEPSIS)
1000.0000 mL | Freq: Once | INTRAVENOUS | Status: AC
  Administered 2013-02-15: 1000 mL via INTRAVENOUS

## 2013-02-15 MED ORDER — POTASSIUM CHLORIDE CRYS ER 20 MEQ PO TBCR
40.0000 meq | EXTENDED_RELEASE_TABLET | Freq: Once | ORAL | Status: AC
  Administered 2013-02-15: 40 meq via ORAL
  Filled 2013-02-15: qty 2

## 2013-02-15 MED ORDER — ONDANSETRON HCL 4 MG/2ML IJ SOLN
4.0000 mg | Freq: Once | INTRAMUSCULAR | Status: AC
  Administered 2013-02-15: 4 mg via INTRAVENOUS
  Filled 2013-02-15: qty 2

## 2013-02-15 MED ORDER — DEXTROSE 5 % IV SOLN
1.0000 g | Freq: Once | INTRAVENOUS | Status: AC
  Administered 2013-02-15: 1 g via INTRAVENOUS
  Filled 2013-02-15: qty 10

## 2013-02-15 NOTE — ED Notes (Signed)
Per EMS pt called them with the c/o severe back pain that has not removed with any home interventions. When they arrived the pt also had a tympanic fever of 101.5. She was rating her pain 10/10.  EMS gave Fentanyl IV along with Tylenol 1000mg  PO. CBG per EMS was 369. They did start an IV. VSS per EMS  117/79; 90-92%RA, hr 100-120 ST on the monitor.  Pt from home alone.

## 2013-02-15 NOTE — ED Notes (Signed)
Bed: WA06 Expected date:  Expected time:  Means of arrival:  Comments: Fever, back, abd pain

## 2013-02-15 NOTE — ED Provider Notes (Signed)
CSN: [id number]     Arrival date & time 02/15/13  1724 History   First MD Initiated Contact with Patient 02/15/13 1749     Chief Complaint  Patient presents with  . Back Pain  . Fever   (Consider location/radiation/quality/duration/timing/severity/associated sxs/prior Treatment) HPI Patient reports about 6 days ago she had bilateral flank pain that has started coming around to her epigastric area now shoots down to her suprapubic area. She states the pain is sharp and it comes and goes. She states it lasts about 5-10 minutes. She denies nausea, vomiting, diarrhea, or constipation although she states her last bowel movement was 5 days ago. She states she thinks she's had fever. She denies any dysuria or frequency. She states about 2 weeks ago she did notice her urine was cloudy. She states she's never had this pain before. She states changing positions makes the pain worse.  PCP Dr Concepcion Elk  Past Medical History  Diagnosis Date  . Diabetes mellitus   . Hypertension   . Gout    Past Surgical History  Procedure Laterality Date  . Lung surgery     History reviewed. No pertinent family history. History  Substance Use Topics  . Smoking status: Never Smoker   . Smokeless tobacco: Not on file  . Alcohol Use: No   Unemployed  Lives with sister Takes care of her autistic son   OB History   Grav Para Term Preterm Abortions TAB SAB Ect Mult Living                 Review of Systems  All other systems reviewed and are negative.    Allergies  Review of patient's allergies indicates no known allergies.  Home Medications   Current Outpatient Rx  Name  Route  Sig  Dispense  Refill  . acetaminophen (TYLENOL) 500 MG tablet   Oral   Take 1,000 mg by mouth every 6 (six) hours as needed for pain or fever.         . insulin glargine (LANTUS) 100 UNIT/ML injection   Subcutaneous   Inject 60 Units into the skin at bedtime.         . metFORMIN (GLUCOPHAGE) 1000 MG tablet   Oral    Take 1,000 mg by mouth 2 (two) times daily with a meal.           . niacin-simvastatin (SIMCOR) 500-20 MG 24 hr tablet   Oral   Take 1 tablet by mouth at bedtime.          BP 123/62  Pulse 94  Temp(Src) 99.8 F (37.7 C) (Oral)  Resp 16  SpO2 95% Vital signs normal   Physical Exam  Nursing note and vitals reviewed. Constitutional: She is oriented to person, place, and time. She appears well-developed and well-nourished.  Non-toxic appearance. She does not appear ill. No distress.  HENT:  Head: Normocephalic and atraumatic.  Right Ear: External ear normal.  Left Ear: External ear normal.  Nose: Nose normal. No mucosal edema or rhinorrhea.  Mouth/Throat: Mucous membranes are normal. No dental abscesses or edematous.  Dry tongue  Eyes: Conjunctivae and EOM are normal. Pupils are equal, round, and reactive to light.  Neck: Normal range of motion and full passive range of motion without pain. Neck supple.  Cardiovascular: Normal rate, regular rhythm and normal heart sounds.  Exam reveals no gallop and no friction rub.   No murmur heard. Pulmonary/Chest: Effort normal and breath sounds normal. No respiratory distress. She  has no wheezes. She has no rhonchi. She has no rales. She exhibits no tenderness and no crepitus.  Abdominal: Soft. Normal appearance and bowel sounds are normal. She exhibits no distension. There is no tenderness. There is no rebound and no guarding.  Genitourinary:  Bilateral flank pain.  Musculoskeletal: Normal range of motion. She exhibits no edema and no tenderness.  Moves all extremities well.   Neurological: She is alert and oriented to person, place, and time. She has normal strength. No cranial nerve deficit.  Skin: Skin is warm, dry and intact. No rash noted. No erythema. No pallor.  Warm and clammy to touch  Psychiatric: She has a normal mood and affect. Her speech is normal and behavior is normal. Her mood appears not anxious.    ED Course   Procedures (including critical care time)  Medications  0.9 %  sodium chloride infusion (not administered)  insulin regular (NOVOLIN R,HUMULIN R) 1 Units/mL in sodium chloride 0.9 % 100 mL infusion (2.7 Units/hr Intravenous New Bag/Given 02/15/13 2237)  sodium chloride 0.9 % bolus 1,000 mL (0 mLs Intravenous Stopped 02/15/13 2117)  fentaNYL (SUBLIMAZE) injection 50 mcg (50 mcg Intravenous Given 02/15/13 1945)  ondansetron (ZOFRAN) injection 4 mg (4 mg Intravenous Given 02/15/13 1945)  sodium chloride 0.9 % bolus 1,000 mL (1,000 mLs Intravenous New Bag/Given 02/15/13 2117)  cefTRIAXone (ROCEPHIN) 1 g in dextrose 5 % 50 mL IVPB (0 g Intravenous Stopped 02/15/13 2149)  potassium chloride SA (K-DUR,KLOR-CON) CR tablet 40 mEq (40 mEq Oral Given 02/15/13 2122)    Discussed with patient she most likely has pyelonephritis. CT of abdomen and pelvis done to rule out renal stone.  Patient's CT was consistent with pyelonephritis. She was started on antibiotics. She was started on insulin drip to control her hyperglycemia. She was given IV fluids for her dehydration.  Pt given her test results. She was started on an insulin drip. She states her glucose is usually around 120. She states she would prefer to be admitted.   23:08 Dr Malachi Bonds admit to step down team 8  Labs Review  Results for orders placed during the hospital encounter of 02/15/13  CBC WITH DIFFERENTIAL      Result Value Range   WBC 6.7  4.0 - 10.5 K/uL   RBC 4.35  3.87 - 5.11 MIL/uL   Hemoglobin 12.5  12.0 - 15.0 g/dL   HCT 56.2 (*) 13.0 - 86.5 %   MCV 82.1  78.0 - 100.0 fL   MCH 28.7  26.0 - 34.0 pg   MCHC 35.0  30.0 - 36.0 g/dL   RDW 78.4  69.6 - 29.5 %   Platelets 172  150 - 400 K/uL   Neutrophils Relative % 73  43 - 77 %   Neutro Abs 4.9  1.7 - 7.7 K/uL   Lymphocytes Relative 12  12 - 46 %   Lymphs Abs 0.8  0.7 - 4.0 K/uL   Monocytes Relative 15 (*) 3 - 12 %   Monocytes Absolute 1.0  0.1 - 1.0 K/uL   Eosinophils Relative 0  0 - 5 %    Eosinophils Absolute 0.0  0.0 - 0.7 K/uL   Basophils Relative 0  0 - 1 %   Basophils Absolute 0.0  0.0 - 0.1 K/uL  COMPREHENSIVE METABOLIC PANEL      Result Value Range   Sodium 127 (*) 135 - 145 mEq/L   Potassium 2.7 (*) 3.5 - 5.1 mEq/L   Chloride 89 (*) 96 -  112 mEq/L   CO2 22  19 - 32 mEq/L   Glucose, Bld 424 (*) 70 - 99 mg/dL   BUN 9  6 - 23 mg/dL   Creatinine, Ser 6.29  0.50 - 1.10 mg/dL   Calcium 9.3  8.4 - 52.8 mg/dL   Total Protein 7.4  6.0 - 8.3 g/dL   Albumin 2.8 (*) 3.5 - 5.2 g/dL   AST 14  0 - 37 U/L   ALT 11  0 - 35 U/L   Alkaline Phosphatase 97  39 - 117 U/L   Total Bilirubin 0.7  0.3 - 1.2 mg/dL   GFR calc non Af Amer >90  >90 mL/min   GFR calc Af Amer >90  >90 mL/min  URINALYSIS, ROUTINE W REFLEX MICROSCOPIC      Result Value Range   Color, Urine YELLOW  YELLOW   APPearance CLEAR  CLEAR   Specific Gravity, Urine 1.032 (*) 1.005 - 1.030   pH 6.0  5.0 - 8.0   Glucose, UA >1000 (*) NEGATIVE mg/dL   Hgb urine dipstick SMALL (*) NEGATIVE   Bilirubin Urine NEGATIVE  NEGATIVE   Ketones, ur 40 (*) NEGATIVE mg/dL   Protein, ur 413 (*) NEGATIVE mg/dL   Urobilinogen, UA 1.0  0.0 - 1.0 mg/dL   Nitrite NEGATIVE  NEGATIVE   Leukocytes, UA NEGATIVE  NEGATIVE  URINE MICROSCOPIC-ADD ON      Result Value Range   Squamous Epithelial / LPF RARE  RARE   WBC, UA 0-2  <3 WBC/hpf   RBC / HPF 3-6  <3 RBC/hpf   Urine-Other MUCOUS PRESENT     Laboratory interpretation all normal except poss uti, hyponatremia, hypokalemia, low chloride, hyperglycemia    Imaging Review Ct Abdomen Pelvis Wo Contrast  02/15/2013   *RADIOLOGY REPORT*  Clinical Data:  Bilateral flank pain.  Left lower quadrant pain and fever.  CT ABDOMEN AND PELVIS WITHOUT CONTRAST (CT UROGRAM)  Technique: Contiguous axial images of the abdomen and pelvis without oral or intravenous contrast were obtained.  Comparison: None  Findings:  Exam is limited for evaluation of entities other than urinary tract calculi due to  lack of oral or intravenous contrast.   Lung bases:  Scarring at the right lung base with probable surgical changes.  Normal heart size with small pericardial effusion.  Small left pleural effusion. Multivessel coronary artery atherosclerosis.  Abdomen/pelvis:  Mild to moderate hepatic steatosis and hepatomegaly.  22.6 cm cranial caudal.  Normal spleen, stomach, pancreas, biliary tract, adrenal glands. Gallbladder distended, without specific evidence of acute cholecystitis.  Moderate right-sided renal and perirenal edema.  No caliectasis or hydroureter.  No ureteric stone.  No retroperitoneal or retrocrural adenopathy.  Fluid-filled colon, suggesting a diarrheal state. Normal terminal ileum.  Appendix not identified.  Peripherally calcified structure in the anterior left pelvis on image 75/series 2 is favored to be the sequelae of remote fat necrosis (i.e. epiploic appendagitis).  Normal small bowel. No pneumatosis or free intraperitoneal air.  No pelvic adenopathy.  Normal urinary bladder and uterus.  No adnexal mass.  No significant free fluid.  Right paramidline umbilical hernia which contains fat.  Bones/Musculoskeletal:  No acute osseous abnormality.  Degenerative disc disease involves lumbosacral junction.  IMPRESSION:  1.  Right renal and perirenal edema.  This is suspicious for pyelonephritis.  Given lack of caliectasis or hydroureter, prior stone passage is felt less likely. 2.  Hepatic steatosis and hepatomegaly. 3. Age advanced coronary artery atherosclerosis.  Recommend assessment of coronary  risk factors and consideration of medical therapy. 4.  Small pericardial and left-sided pleural effusions.   Original Report Authenticated By: Jeronimo Greaves, M.D.    MDM   1. Pyelonephritis, acute   2. Hypokalemia   3. Hyponatremia   4. Dehydration   5. Hyperglycemia     Plan admission   Devoria Albe, MD, FACEP  CRITICAL CARE Performed by: Devoria Albe L Total critical care time: 33 min Critical care  time was exclusive of separately billable procedures and treating other patients. Critical care was necessary to treat or prevent imminent or life-threatening deterioration. Critical care was time spent personally by me on the following activities: development of treatment plan with patient and/or surrogate as well as nursing, discussions with consultants, evaluation of patient's response to treatment, examination of patient, obtaining history from patient or surrogate, ordering and performing treatments and interventions, ordering and review of laboratory studies, ordering and review of radiographic studies, pulse oximetry and re-evaluation of patient's condition.     Ward Givens, MD 02/15/13 2314

## 2013-02-15 NOTE — H&P (Signed)
[facility] History and Physical  Calissa Swenor ZOX:[id number] DOB: Sep 20, 1955 DOA: 02/15/2013  Referring physician:  Devoria Albe PCP:  No primary provider on file.   Chief Complaint:  Nausea, vomiting, diarrhea, abdominal pain  HPI:  The patient is a 57 y.o. year-old female with history of T2DM, HTN, HLD who presents with one week history of chills and bilateral flank pain that radiates anteriorly to the epigastric area and down to the suprapubic area.  The patient was last at their baseline health prior to one week ago.  She has had associated nausea, vomiting (nonbilious and nonbloody), but denies dysuria.  She has had increased frequency of urination without urgency.  She has become progressively lightheaded and then developed watery diarrhea x 4 times so far today.  She denies travel or antibiotics within the last 3 months.    She states she does not have enough money to pay for the $3 copay for medications or MD appointments and will not have that money until October.  She has a little bit of lantus insulin left but has not been taking it every day.  She normally takes 60 units in the evenings and metformin 1000mg  BID.  She did not take her medications yesterday.    In the ER, VSS, sodium 127 with glucose of 424, bicarb 22, anion gap of 16, potassium 2.7, creatinine 0.58, CBC wnl.  UA with >1000 glucose, 40 ketones, 100 protein, 0-2 WBC.  CT abd/pelvis demonstrates right renal and perirenal edema suspicious for pyelonephritis.  She also had incidentally noted hepatitis steatosis and hepatomegaly and advanced CA atherosclerosis, small pericardia and left-sided pleural effusion.    Review of Systems:  General:  + fevers, chills.  Denies weight loss or gain HEENT:  Denies changes to hearing and vision, rhinorrhea, sinus congestion, sore throat CV:  Denies chest pain and palpitations, lower extremity edema.  PULM:  Denies SOB, wheezing, cough.   GI:  Per HPI GU:  Per HPI ENDO:  +  polyuria, polydipsia.   HEME:  Denies hematemesis, blood in stools, melena, abnormal bruising or bleeding.  LYMPH:  Denies lymphadenopathy.   MSK:  Denies arthralgias, myalgias.   DERM:  Denies skin rash or ulcer.   NEURO:  Denies focal numbness, weakness, slurred speech, confusion, facial droop.  PSYCH:  Denies anxiety and depression.    Past Medical History  Diagnosis Date  . Diabetes mellitus   . Hypertension   . Gout   . Hyperlipidemia    Past Surgical History  Procedure Laterality Date  . Lung surgery     Social History:  reports that she has never smoked. She does not have any smokeless tobacco history on file. She reports that she does not drink alcohol or use illicit drugs. Lives with her sister and does not need any assistance with ADLs.    No Known Allergies  Family History  Problem Relation Age of Onset  . Diabetes Sister   . High blood pressure Neg Hx   . High Cholesterol Neg Hx   . Heart attack Neg Hx      Prior to Admission medications   Medication Sig Start Date End Date Taking? Authorizing Provider  acetaminophen (TYLENOL) 500 MG tablet Take 1,000 mg by mouth every 6 (six) hours as needed for pain or fever.   Yes Historical Provider, MD  insulin glargine (LANTUS) 100 UNIT/ML injection Inject 60 Units into the skin at bedtime.   Yes Historical Provider, MD  metFORMIN (GLUCOPHAGE) 1000 MG tablet  Take 1,000 mg by mouth 2 (two) times daily with a meal.     Yes Historical Provider, MD  niacin-simvastatin (SIMCOR) 500-20 MG 24 hr tablet Take 1 tablet by mouth at bedtime.   Yes Historical Provider, MD   Physical Exam: Filed Vitals:   02/15/13 1733 02/15/13 1743 02/15/13 2029  BP:  125/72 123/62  Pulse:  101 94  Temp:  99.8 F (37.7 C)   TempSrc:  Oral   Resp:  21 16  SpO2: 92% 96% 95%     General:  CF, no acute distress  Eyes:  PERRL, anicteric, non-injected.  ENT:  Nares clear.  OP clear, non-erythematous without plaques or exudates.  Dry MM.  Neck:   Supple without TM or JVD.    Lymph:  No cervical, supraclavicular, or submandibular LAD.  Cardiovascular:  RRR, normal S1, S2, without m/r/g.  2+ pulses, warm extremities  Respiratory:  CTA bilaterally without increased WOB.  Abdomen:  NABS.  Soft, ND, TTP in the RUQ without rebound or guarding   Skin:  No rashes or focal lesions.  Musculoskeletal:  Normal bulk and tone.  No LE edema.  No flank pain bilaterally  Psychiatric:  A & O x 4.  Appropriate affect.  Neurologic:  CN 3-12 intact.  5/5 strength.  Sensation intact.  Labs on Admission:  Basic Metabolic Panel:  Recent Labs Lab 02/15/13 1900  NA 127*  K 2.7*  CL 89*  CO2 22  GLUCOSE 424*  BUN 9  CREATININE 0.58  CALCIUM 9.3   Liver Function Tests:  Recent Labs Lab 02/15/13 1900  AST 14  ALT 11  ALKPHOS 97  BILITOT 0.7  PROT 7.4  ALBUMIN 2.8*   No results found for this basename: LIPASE, AMYLASE,  in the last 168 hours No results found for this basename: AMMONIA,  in the last 168 hours CBC:  Recent Labs Lab 02/15/13 1900  WBC 6.7  NEUTROABS 4.9  HGB 12.5  HCT 35.7*  MCV 82.1  PLT 172   Cardiac Enzymes: No results found for this basename: CKTOTAL, CKMB, CKMBINDEX, TROPONINI,  in the last 168 hours  BNP (last 3 results) No results found for this basename: PROBNP,  in the last 8760 hours CBG: No results found for this basename: GLUCAP,  in the last 168 hours  Radiological Exams on Admission: Ct Abdomen Pelvis Wo Contrast  02/15/2013   *RADIOLOGY REPORT*  Clinical Data:  Bilateral flank pain.  Left lower quadrant pain and fever.  CT ABDOMEN AND PELVIS WITHOUT CONTRAST (CT UROGRAM)  Technique: Contiguous axial images of the abdomen and pelvis without oral or intravenous contrast were obtained.  Comparison: None  Findings:  Exam is limited for evaluation of entities other than urinary tract calculi due to lack of oral or intravenous contrast.   Lung bases:  Scarring at the right lung base with probable  surgical changes.  Normal heart size with small pericardial effusion.  Small left pleural effusion. Multivessel coronary artery atherosclerosis.  Abdomen/pelvis:  Mild to moderate hepatic steatosis and hepatomegaly.  22.6 cm cranial caudal.  Normal spleen, stomach, pancreas, biliary tract, adrenal glands. Gallbladder distended, without specific evidence of acute cholecystitis.  Moderate right-sided renal and perirenal edema.  No caliectasis or hydroureter.  No ureteric stone.  No retroperitoneal or retrocrural adenopathy.  Fluid-filled colon, suggesting a diarrheal state. Normal terminal ileum.  Appendix not identified.  Peripherally calcified structure in the anterior left pelvis on image 75/series 2 is favored to be the sequelae  of remote fat necrosis (i.e. epiploic appendagitis).  Normal small bowel. No pneumatosis or free intraperitoneal air.  No pelvic adenopathy.  Normal urinary bladder and uterus.  No adnexal mass.  No significant free fluid.  Right paramidline umbilical hernia which contains fat.  Bones/Musculoskeletal:  No acute osseous abnormality.  Degenerative disc disease involves lumbosacral junction.  IMPRESSION:  1.  Right renal and perirenal edema.  This is suspicious for pyelonephritis.  Given lack of caliectasis or hydroureter, prior stone passage is felt less likely. 2.  Hepatic steatosis and hepatomegaly. 3. Age advanced coronary artery atherosclerosis.  Recommend assessment of coronary risk factors and consideration of medical therapy. 4.  Small pericardial and left-sided pleural effusions.   Original Report Authenticated By: Jeronimo Greaves, M.D.    Assessment/Plan Principal Problem:   Pyelonephritis Active Problems:   Dehydration   Hyperglycemia   Increased anion gap metabolic acidosis   HTN (hypertension)   HLD (hyperlipidemia)   Diabetes mellitus type 2 in obese   Coronary atherosclerosis seen on CT   Nausea and vomiting   Diarrhea   Hyperglycemia with mild elevation of anion  gap with ketones on UA.  May be due to mild DKA vs. Ketosis from not eating with hyperglycemia secondary to not taking her insulin in a few days.  -  Lantus 60 units (home dose) -  Start aspart low dose q2h overnight until FS < 200, then okay to transition to CBG qACHS and moderate dose aspart TID AC -  A1c -  Hold metformin due to diarrhea  Dehydration, likely secondary to persistent hyperglycemia -  Continue IVF  Possible pyelonephritis on CT  -  Continue ceftriaxone -  F/u urine culture  Nausea and vomiting and recent diarrhea, may be due to pyelonephritis -  zofran prn -  C. Diff  HTN/HLD, blood pressure stable -  Change from simvastatin/niacin combo to monotherapy simvastatin.  No mortality benefit from addition of niacin to statin therapy.  Signs of CAD on CT - ECG -  Lipid panel and A1c pending  Inability to afford medication and MD visits in the setting of medicaid.  CM consult to see if there may be cheaper housing options  Diet:  Diabetic  Access:  PIV IVF:  NS Proph:  lovenox  Code Status: full Family Communication: spoke with patient alone Disposition Plan: Admit to telemetry  Time spent: 60 min Renae Fickle [facility] Pager [phone]  If 7PM-7AM, please contact night-coverage [email or web address] Password [facility] 02/15/2013, 11:47 PM

## 2013-02-16 LAB — LIPID PANEL
LDL Cholesterol: 88 mg/dL (ref 0–99)
Total CHOL/HDL Ratio: 7.9 RATIO

## 2013-02-16 LAB — GLUCOSE, CAPILLARY
Glucose-Capillary: 163 mg/dL — ABNORMAL HIGH (ref 70–99)
Glucose-Capillary: 219 mg/dL — ABNORMAL HIGH (ref 70–99)
Glucose-Capillary: 296 mg/dL — ABNORMAL HIGH (ref 70–99)
Glucose-Capillary: 322 mg/dL — ABNORMAL HIGH (ref 70–99)

## 2013-02-16 LAB — BASIC METABOLIC PANEL
BUN: 7 mg/dL (ref 6–23)
Creatinine, Ser: 0.59 mg/dL (ref 0.50–1.10)
GFR calc Af Amer: >90 mL/min (ref >90)
GFR calc non Af Amer: >90 mL/min (ref >90)

## 2013-02-16 LAB — CBC
HCT: 32.3 % — ABNORMAL LOW (ref 36.0–46.0)
MCH: 28 pg (ref 26.0–34.0)
MCHC: 33.7 g/dL (ref 30.0–36.0)
RDW: 13.1 % (ref 11.5–15.5)

## 2013-02-16 LAB — HEMOGLOBIN A1C
Hgb A1c MFr Bld: 13.4 % — ABNORMAL HIGH (ref <5.7)
Mean Plasma Glucose: 338 mg/dL — ABNORMAL HIGH (ref <117)

## 2013-02-16 MED ORDER — INSULIN ASPART 100 UNIT/ML ~~LOC~~ SOLN
0.0000 [IU] | Freq: Three times a day (TID) | SUBCUTANEOUS | Status: DC
  Administered 2013-02-16: 07:00:00 3 [IU] via SUBCUTANEOUS

## 2013-02-16 MED ORDER — SODIUM CHLORIDE 0.9 % IJ SOLN
3.0000 mL | Freq: Two times a day (BID) | INTRAMUSCULAR | Status: DC
  Administered 2013-02-16 – 2013-02-18 (×5): 3 mL via INTRAVENOUS

## 2013-02-16 MED ORDER — MAGNESIUM SULFATE 40 MG/ML IJ SOLN
2.0000 g | Freq: Once | INTRAMUSCULAR | Status: AC
  Administered 2013-02-16: 2 g via INTRAVENOUS
  Filled 2013-02-16: qty 50

## 2013-02-16 MED ORDER — ONDANSETRON HCL 4 MG/2ML IJ SOLN
4.0000 mg | Freq: Four times a day (QID) | INTRAMUSCULAR | Status: DC | PRN
  Administered 2013-02-17: 21:00:00 4 mg via INTRAVENOUS
  Filled 2013-02-16: qty 2

## 2013-02-16 MED ORDER — DEXTROSE 5 % IV SOLN
1.0000 g | INTRAVENOUS | Status: DC
  Administered 2013-02-16 – 2013-02-18 (×3): 1 g via INTRAVENOUS
  Filled 2013-02-16 (×3): qty 10

## 2013-02-16 MED ORDER — SIMVASTATIN 20 MG PO TABS
20.0000 mg | ORAL_TABLET | Freq: Every day | ORAL | Status: DC
  Administered 2013-02-16 – 2013-02-18 (×3): 20 mg via ORAL
  Filled 2013-02-16 (×4): qty 1

## 2013-02-16 MED ORDER — MORPHINE SULFATE 2 MG/ML IJ SOLN: 1.0000 mg | INTRAMUSCULAR | Status: DC | PRN

## 2013-02-16 MED ORDER — INSULIN ASPART 100 UNIT/ML ~~LOC~~ SOLN
0.0000 [IU] | Freq: Every day | SUBCUTANEOUS | Status: DC
  Administered 2013-02-16: 4 [IU] via SUBCUTANEOUS
  Administered 2013-02-17 – 2013-02-18 (×2): 2 [IU] via SUBCUTANEOUS

## 2013-02-16 MED ORDER — INSULIN ASPART 100 UNIT/ML ~~LOC~~ SOLN
0.0000 [IU] | SUBCUTANEOUS | Status: DC
  Administered 2013-02-16: 3 [IU] via SUBCUTANEOUS
  Administered 2013-02-16: 01:00:00 5 [IU] via SUBCUTANEOUS
  Administered 2013-02-16: 05:00:00 2 [IU] via SUBCUTANEOUS

## 2013-02-16 MED ORDER — ACETAMINOPHEN 500 MG PO TABS
1000.0000 mg | ORAL_TABLET | Freq: Four times a day (QID) | ORAL | Status: DC | PRN
  Administered 2013-02-16 – 2013-02-17 (×4): 1000 mg via ORAL
  Filled 2013-02-16 (×4): qty 2

## 2013-02-16 MED ORDER — INSULIN ASPART 100 UNIT/ML ~~LOC~~ SOLN
0.0000 [IU] | Freq: Three times a day (TID) | SUBCUTANEOUS | Status: DC
  Administered 2013-02-16 – 2013-02-17 (×4): 5 [IU] via SUBCUTANEOUS
  Administered 2013-02-17: 3 [IU] via SUBCUTANEOUS
  Administered 2013-02-18 (×2): 2 [IU] via SUBCUTANEOUS
  Administered 2013-02-18: 18:00:00 3 [IU] via SUBCUTANEOUS
  Administered 2013-02-19: 2 [IU] via SUBCUTANEOUS

## 2013-02-16 MED ORDER — ENOXAPARIN SODIUM 40 MG/0.4ML ~~LOC~~ SOLN
40.0000 mg | SUBCUTANEOUS | Status: DC
  Administered 2013-02-16 – 2013-02-19 (×4): 40 mg via SUBCUTANEOUS
  Filled 2013-02-16 (×4): qty 0.4

## 2013-02-16 MED ORDER — INSULIN GLARGINE 100 UNIT/ML ~~LOC~~ SOLN
60.0000 [IU] | Freq: Every day | SUBCUTANEOUS | Status: DC
  Administered 2013-02-16 – 2013-02-18 (×3): 60 [IU] via SUBCUTANEOUS
  Filled 2013-02-16 (×3): qty 0.6

## 2013-02-16 MED ORDER — ONDANSETRON HCL 4 MG PO TABS: 4.0000 mg | ORAL_TABLET | Freq: Four times a day (QID) | ORAL | Status: DC | PRN

## 2013-02-16 MED ORDER — POTASSIUM CHLORIDE 10 MEQ/100ML IV SOLN
10.0000 meq | INTRAVENOUS | Status: AC
  Administered 2013-02-16 (×4): 10 meq via INTRAVENOUS
  Filled 2013-02-16 (×4): qty 100

## 2013-02-16 MED ORDER — SODIUM CHLORIDE 0.9 % IV SOLN
INTRAVENOUS | Status: DC
  Administered 2013-02-16 – 2013-02-17 (×5): via INTRAVENOUS

## 2013-02-16 MED ORDER — HYDROCODONE-ACETAMINOPHEN 5-325 MG PO TABS
1.0000 | ORAL_TABLET | ORAL | Status: DC | PRN
  Administered 2013-02-17 – 2013-02-19 (×3): 2 via ORAL
  Filled 2013-02-16 (×3): qty 2

## 2013-02-16 NOTE — Progress Notes (Signed)
C.diff PCR came back negative. Contact precautions d/c. Patient informed.

## 2013-02-16 NOTE — Progress Notes (Signed)
Nutrition Brief Note  Patient identified on the Malnutrition Screening Tool (MST) Report  Wt Readings from Last 15 Encounters:  02/16/13 162 lb 11.2 oz (73.8 kg)  06/22/11 177 lb 11.1 oz (80.6 kg)    Body mass index is 28.83 kg/(m^2). Patient meets criteria for Overweight based on current BMI.   Current diet order is Carb Modified, patient is consuming approximately 60-100% of meals at this time. Pt states she was eating well PTA, that her appetite is good, and she is eating as she normally does today. She also denies nausea, vomiting, and diarrhea. She states her usual body weight is 140 lbs. She reports following a diabetic diet and denies drinking sugar-sweetened beverages or eating sweets often. Pt states she knows what to eat to control her blood glucose but, she ran out of insulin. RD offered further diabetes diet education and handouts but, pt refused.  Labs and medications reviewed.   No nutrition interventions warranted at this time. If nutrition issues arise, please consult RD.   Ian Malkin RD, LDN Inpatient Clinical Dietitian Pager: [phone] After Hours Pager: [phone]

## 2013-02-16 NOTE — Progress Notes (Signed)
Patient declines MyChart activation-does not have a computer 

## 2013-02-16 NOTE — Progress Notes (Signed)
CRITICAL VALUE ALERT  Critical value received:  K+ 2.7  Date of notification:  9/3  Time of notification:  0600  Critical value read back: yes  Nurse who received alert:  Jeymi Hepp RN  MD notified (1st page):  schorr  Time of first page:  0608  MD notified (2nd page):  Time of second page:  Responding MD:  schorr  Time MD responded:  [phone]

## 2013-02-16 NOTE — Progress Notes (Signed)
EKG completed on floor, placed in wall chart.

## 2013-02-16 NOTE — Progress Notes (Signed)
[facility] PROGRESS NOTE  Emily Farmer ZOX:[id number] DOB: [birth year] DOA: 02/15/2013 PCP: No primary provider on file.  Assessment/Plan: 1. Hyperglycemia with mild elevation of anion gap with ketones on UA. May be due to mild DKA vs. Ketosis from not eating with hyperglycemia secondary to not taking her insulin in a few days.  - Lantus 60 units (home dose)  - start SSI achs.  - A1c  - Hold metformin due to diarrhea  2.Dehydration, likely secondary to persistent hyperglycemia  - Continue IVF  3. Possible pyelonephritis on CT  - Continue ceftriaxone  - F/u urine culture  4. Nausea and vomiting and recent diarrhea, may be due to pyelonephritis  - zofran prn  - C. Diff pending. No diarrhea today.  HTN/HLD, blood pressure stable  - Change from simvastatin/niacin combo to monotherapy simvastatin. No mortality benefit from addition of niacin to statin therapy.  Signs of CAD on CT  - Lipid panel and A1c pending  Inability to afford medication and MD visits in the setting of medicaid. CM consult to see if there may be cheaper housing options  Diet: Diabetic  Access: PIV  IVF: NS  Proph: lovenox   Code Status: full code Family Communication: sister at bedside) Disposition Plan: 1to 2 days home.    Consultants:  none  Procedures:  none  Antibiotics:  Rocephin 9/2 for pyelonephritis.   HPI/Subjective: Comfortable. Mild abd pain. Wants to know when she can go home.   Objective: Filed Vitals:   02/16/13 0502  BP: 120/70  Pulse: 92  Temp: 98.6 F (37 C)  Resp: 16    Intake/Output Summary (Last 24 hours) at 02/16/13 0957 Last data filed at 02/16/13 0800  Gross per 24 hour  Intake   1950 ml  Output      0 ml  Net   1950 ml   Filed Weights   02/16/13 0030 02/16/13 0500  Weight: 73.2 kg (161 lb 6 oz) 73.8 kg (162 lb 11.2 oz)    Exam:   General:  Alert afebrile comfortable  Cardiovascular: s1s2  Respiratory: ctab no wheezing or rhonchi  Abdomen:  soft mild tenderness in the supra pubic area  Musculoskeletal: no pedal edema.   Data Reviewed: Basic Metabolic Panel:  Recent Labs Lab 02/15/13 1900 02/16/13 0440  NA 127* 135  K 2.7* 2.7*  CL 89* 101  CO2 22 25  GLUCOSE 424* 170*  BUN 9 7  CREATININE 0.58 0.59  CALCIUM 9.3 8.2*  MG  --  1.2*   Liver Function Tests:  Recent Labs Lab 02/15/13 1900  AST 14  ALT 11  ALKPHOS 97  BILITOT 0.7  PROT 7.4  ALBUMIN 2.8*   No results found for this basename: LIPASE, AMYLASE,  in the last 168 hours No results found for this basename: AMMONIA,  in the last 168 hours CBC:  Recent Labs Lab 02/15/13 1900 02/16/13 0440  WBC 6.7 4.7  NEUTROABS 4.9  --   HGB 12.5 10.9*  HCT 35.7* 32.3*  MCV 82.1 83.0  PLT 172 137*   Cardiac Enzymes: No results found for this basename: CKTOTAL, CKMB, CKMBINDEX, TROPONINI,  in the last 168 hours BNP (last 3 results) No results found for this basename: PROBNP,  in the last 8760 hours CBG:  Recent Labs Lab 02/15/13 2229 02/16/13 0027 02/16/13 0243 02/16/13 0433 02/16/13 0704  GLUCAP 325* 296* 212* 176* 163*    No results found for this or any previous visit (from the past 240 hour(s)).  Studies: Ct Abdomen Pelvis Wo Contrast  02/15/2013   *RADIOLOGY REPORT*  Clinical Data:  Bilateral flank pain.  Left lower quadrant pain and fever.  CT ABDOMEN AND PELVIS WITHOUT CONTRAST (CT UROGRAM)  Technique: Contiguous axial images of the abdomen and pelvis without oral or intravenous contrast were obtained.  Comparison: None  Findings:  Exam is limited for evaluation of entities other than urinary tract calculi due to lack of oral or intravenous contrast.   Lung bases:  Scarring at the right lung base with probable surgical changes.  Normal heart size with small pericardial effusion.  Small left pleural effusion. Multivessel coronary artery atherosclerosis.  Abdomen/pelvis:  Mild to moderate hepatic steatosis and hepatomegaly.  22.6 cm cranial  caudal.  Normal spleen, stomach, pancreas, biliary tract, adrenal glands. Gallbladder distended, without specific evidence of acute cholecystitis.  Moderate right-sided renal and perirenal edema.  No caliectasis or hydroureter.  No ureteric stone.  No retroperitoneal or retrocrural adenopathy.  Fluid-filled colon, suggesting a diarrheal state. Normal terminal ileum.  Appendix not identified.  Peripherally calcified structure in the anterior left pelvis on image 75/series 2 is favored to be the sequelae of remote fat necrosis (i.e. epiploic appendagitis).  Normal small bowel. No pneumatosis or free intraperitoneal air.  No pelvic adenopathy.  Normal urinary bladder and uterus.  No adnexal mass.  No significant free fluid.  Right paramidline umbilical hernia which contains fat.  Bones/Musculoskeletal:  No acute osseous abnormality.  Degenerative disc disease involves lumbosacral junction.  IMPRESSION:  1.  Right renal and perirenal edema.  This is suspicious for pyelonephritis.  Given lack of caliectasis or hydroureter, prior stone passage is felt less likely. 2.  Hepatic steatosis and hepatomegaly. 3. Age advanced coronary artery atherosclerosis.  Recommend assessment of coronary risk factors and consideration of medical therapy. 4.  Small pericardial and left-sided pleural effusions.   Original Report Authenticated By: Jeronimo Greaves, M.D.    Scheduled Meds: . cefTRIAXone (ROCEPHIN)  IV  1 g Intravenous Q24H  . enoxaparin (LOVENOX) injection  40 mg Subcutaneous Q24H  . insulin aspart  0-15 Units Subcutaneous TID WC  . insulin glargine  60 Units Subcutaneous QHS  . potassium chloride  10 mEq Intravenous Q1 Hr x 4  . simvastatin  20 mg Oral q1800  . sodium chloride  3 mL Intravenous Q12H   Continuous Infusions: . sodium chloride 125 mL/hr at 02/15/13 2336  . sodium chloride 100 mL/hr at 02/16/13 0507    Principal Problem:   Pyelonephritis Active Problems:   Dehydration   Hyperglycemia   Increased  anion gap metabolic acidosis   HTN (hypertension)   HLD (hyperlipidemia)   Diabetes mellitus type 2 in obese   Coronary atherosclerosis seen on CT   Nausea and vomiting   Diarrhea    Time spent: 25 min    [name]  [facility] Pager [phone] If 7PM-7AM, please contact night-coverage at [email or web address], password [facility] 02/16/2013, 9:57 AM  LOS: 1 day

## 2013-02-16 NOTE — Care Management Note (Signed)
    Page 1 of 1   02/16/2013     2:26:41 PM   CARE MANAGEMENT NOTE 02/16/2013  Patient:  Emily Farmer, Emily Farmer   Account Number:  [id number]  Date Initiated:  02/16/2013  Documentation initiated by:  [facility]  Subjective/Objective Assessment:   57 year old female admitted with nausea, vomiting, diarrhea and abdominal pain.     Action/Plan:   From home.   Anticipated DC Date:  02/19/2013   Anticipated DC Plan:  HOME/SELF CARE      DC Planning Services  CM consult      Choice offered to / List presented to:             Status of service:  In process, will continue to follow Medicare Important Message given?  NA - LOS <3 / Initial given by admissions (If response is "NO", the following Medicare IM given date fields will be blank) Date Medicare IM given:   Date Additional Medicare IM given:    Discharge Disposition:    Per UR Regulation:  Reviewed for med. necessity/level of care/duration of stay  If discussed at Long Length of Stay Meetings, dates discussed:    Comments:  02/16/13 Vicent Febles RN,BSN NCM [phone] PCP-DR.AVBUERE/[facility].SPOKE TO PATIENT ABOUT MEDICAID CO-PAY $3,& HER OBLIGATION TO HER CO PAY,& NO ASST AVAILABLE TO PAY HER OBLIGATION OF $3 CO PAY.PATIENT VOICED UNDERSTANDING.STATESHOME IS SAFE.SHE ALSO SAID HER SISTER TAKES HER MONEY SOMETIMES.MD UPDATED,& RECOMMENDED CSW CONS.

## 2013-02-16 NOTE — Progress Notes (Signed)
Inpatient Diabetes Program Recommendations  AACE/ADA: New Consensus Statement on Inpatient Glycemic Control (2013)  Target Ranges:  Prepandial:   less than 140 mg/dL      Peak postprandial:   less than 180 mg/dL (1-2 hours)      Critically ill patients:  140 - 180 mg/dL   Reason for Visit: Results for Emily Farmer, Emily Farmer (MRN [id number]) as of 02/16/2013 10:57  Ref. Range 02/15/2013 22:29 02/16/2013 00:27 02/16/2013 02:43 02/16/2013 04:33 02/16/2013 07:04  Glucose-Capillary Latest Range: 70-99 mg/dL 409 (H) 811 (H) 914 (H) 176 (H) 163 (H)   Note patient admitted with hyperglycemia.  Was not taking medications consistently according to H&P due to cost.  It appears that patient has Medicaid but states that she cannot afford the $3 co-pay for medications.  Case management consult in place and A1C pending.  Patient was placed back on home dose of Lantus.  CBG's trending down. Will follow.

## 2013-02-17 LAB — BASIC METABOLIC PANEL
CO2: 24 mEq/L (ref 19–32)
Calcium: 8.2 mg/dL — ABNORMAL LOW (ref 8.4–10.5)
Creatinine, Ser: 0.43 mg/dL — ABNORMAL LOW (ref 0.50–1.10)
GFR calc Af Amer: >90 mL/min (ref >90)
GFR calc non Af Amer: >90 mL/min (ref >90)
Sodium: 133 mEq/L — ABNORMAL LOW (ref 135–145)

## 2013-02-17 LAB — MAGNESIUM: Magnesium: 1.3 mg/dL — ABNORMAL LOW (ref 1.5–2.5)

## 2013-02-17 LAB — URINE CULTURE

## 2013-02-17 LAB — GLUCOSE, CAPILLARY: Glucose-Capillary: 250 mg/dL — ABNORMAL HIGH (ref 70–99)

## 2013-02-17 MED ORDER — ACETAMINOPHEN 325 MG PO TABS
650.0000 mg | ORAL_TABLET | Freq: Four times a day (QID) | ORAL | Status: DC | PRN
  Administered 2013-02-17 – 2013-02-18 (×2): 650 mg via ORAL
  Filled 2013-02-17 (×2): qty 2

## 2013-02-17 MED ORDER — INSULIN ASPART 100 UNIT/ML ~~LOC~~ SOLN
3.0000 [IU] | Freq: Three times a day (TID) | SUBCUTANEOUS | Status: DC
  Administered 2013-02-17 – 2013-02-19 (×6): 3 [IU] via SUBCUTANEOUS

## 2013-02-17 MED ORDER — POTASSIUM CHLORIDE CRYS ER 20 MEQ PO TBCR
40.0000 meq | EXTENDED_RELEASE_TABLET | Freq: Two times a day (BID) | ORAL | Status: AC
  Administered 2013-02-17 (×2): 40 meq via ORAL
  Filled 2013-02-17 (×2): qty 2

## 2013-02-17 MED ORDER — MAGNESIUM SULFATE 40 MG/ML IJ SOLN
2.0000 g | Freq: Once | INTRAMUSCULAR | Status: AC
  Administered 2013-02-17: 12:00:00 2 g via INTRAVENOUS
  Filled 2013-02-17: qty 50

## 2013-02-17 NOTE — Progress Notes (Signed)
[facility] PROGRESS NOTE  Emily Farmer WUJ:[id number] DOB: 08-Sep-1955 DOA: 02/15/2013 PCP: No primary provider on file.  Assessment/Plan: 1. Hyperglycemia with mild elevation of anion gap with ketones on UA. May be due to mild DKA vs. Ketosis from not eating with hyperglycemia secondary to not taking her insulin in a few days. RESOLVED.  - Lantus 60 units (home dose)  - start SSI achs.  - A1c is 13.4 - CBG (last 3)   Recent Labs  02/16/13 1620 02/16/13 2109 02/17/13 0726  GLUCAP 219* 322* 189*    Added novolog 3 units TIDAC.  - Hold metformin due to diarrhea  2.Dehydration, likely secondary to persistent hyperglycemia  - Continue IVF  3. Possible pyelonephritis on CT  - Continue ceftriaxone  - urine cultures show 50,000 colonies and  4. Nausea and vomiting and recent diarrhea, may be due to pyelonephritis  - zofran prn  - C. Diff negative. No diarrhea today.  HTN/HLD, blood pressure stable  Signs of CAD on CT  - Lipid panel shows LDL of 88 and HDL of 19.   Diet: Diabetic  Access: PIV  IVF: NS  Proph: lovenox   Code Status: full code Family Communication: none at bedside Disposition Plan: tomorrow.    Consultants:  none  Procedures:  none  Antibiotics:  Rocephin 9/2 for pyelonephritis.   HPI/Subjective: Comfortable. Mild abd pain.   Objective: Filed Vitals:   02/17/13 0554  BP: 141/71  Pulse: 88  Temp:   Resp:     Intake/Output Summary (Last 24 hours) at 02/17/13 1011 Last data filed at 02/17/13 0800  Gross per 24 hour  Intake 3276.33 ml  Output      1 ml  Net 3275.33 ml   Filed Weights   02/16/13 0030 02/16/13 0500 02/17/13 0454  Weight: 73.2 kg (161 lb 6 oz) 73.8 kg (162 lb 11.2 oz) 75.4 kg (166 lb 3.6 oz)    Exam:   General:  Alert afebrile comfortable  Cardiovascular: s1s2  Respiratory: ctab no wheezing or rhonchi  Abdomen: soft mild tenderness in the supra pubic area  Musculoskeletal: no pedal edema.   Data  Reviewed: Basic Metabolic Panel:  Recent Labs Lab 02/15/13 1900 02/16/13 0440 02/17/13 0915  NA 127* 135 133*  K 2.7* 2.7* 3.1*  CL 89* 101 98  CO2 22 25 24   GLUCOSE 424* 170* 277*  BUN 9 7 7   CREATININE 0.58 0.59 0.43*  CALCIUM 9.3 8.2* 8.2*  MG  --  1.2* 1.3*   Liver Function Tests:  Recent Labs Lab 02/15/13 1900  AST 14  ALT 11  ALKPHOS 97  BILITOT 0.7  PROT 7.4  ALBUMIN 2.8*   No results found for this basename: LIPASE, AMYLASE,  in the last 168 hours No results found for this basename: AMMONIA,  in the last 168 hours CBC:  Recent Labs Lab 02/15/13 1900 02/16/13 0440  WBC 6.7 4.7  NEUTROABS 4.9  --   HGB 12.5 10.9*  HCT 35.7* 32.3*  MCV 82.1 83.0  PLT 172 137*   Cardiac Enzymes: No results found for this basename: CKTOTAL, CKMB, CKMBINDEX, TROPONINI,  in the last 168 hours BNP (last 3 results) No results found for this basename: PROBNP,  in the last 8760 hours CBG:  Recent Labs Lab 02/16/13 0704 02/16/13 1151 02/16/13 1620 02/16/13 2109 02/17/13 0726  GLUCAP 163* 233* 219* 322* 189*    Recent Results (from the past 240 hour(s))  CULTURE, BLOOD (ROUTINE X 2)  Status: None   Collection Time    02/15/13  7:00 PM      Result Value Range Status   Specimen Description BLOOD RIGHT WRIST   Final   Special Requests     Final   Value: BOTTLES DRAWN AEROBIC AND ANAEROBIC 5CC AER 7CC ANA   Culture  Setup Time     Final   Value: 02/16/2013 02:42     Performed at [facility]   Culture     Final   Value:        BLOOD CULTURE RECEIVED NO GROWTH TO DATE CULTURE WILL BE HELD FOR 5 DAYS BEFORE ISSUING A FINAL NEGATIVE REPORT     Performed at [facility]   Report Status PENDING   Incomplete  CULTURE, BLOOD (ROUTINE X 2)     Status: None   Collection Time    02/15/13  7:51 PM      Result Value Range Status   Specimen Description BLOOD RIGHT HAND   Final   Special Requests BOTTLES DRAWN AEROBIC AND ANAEROBIC 5CC   Final   Culture   Setup Time     Final   Value: 02/16/2013 02:42     Performed at [facility]   Culture     Final   Value:        BLOOD CULTURE RECEIVED NO GROWTH TO DATE CULTURE WILL BE HELD FOR 5 DAYS BEFORE ISSUING A FINAL NEGATIVE REPORT     Performed at [facility]   Report Status PENDING   Incomplete  URINE CULTURE     Status: None   Collection Time    02/15/13  8:24 PM      Result Value Range Status   Specimen Description URINE, CLEAN CATCH   Final   Special Requests NONE   Final   Culture  Setup Time     Final   Value: 02/16/2013 05:27     Performed at [facility] Count     Final   Value: 50,000 COLONIES/ML     Performed at [facility]   Culture     Final   Value: Multiple bacterial morphotypes present, none predominant. Suggest appropriate recollection if clinically indicated.     Performed at [facility]   Report Status 02/17/2013 FINAL   Final  CLOSTRIDIUM DIFFICILE BY PCR     Status: None   Collection Time    02/16/13 10:00 AM      Result Value Range Status   C difficile by pcr NEGATIVE  NEGATIVE Final   Comment: Performed at [facility]     Studies: Ct Abdomen Pelvis Wo Contrast  02/15/2013   *RADIOLOGY REPORT*  Clinical Data:  Bilateral flank pain.  Left lower quadrant pain and fever.  CT ABDOMEN AND PELVIS WITHOUT CONTRAST (CT UROGRAM)  Technique: Contiguous axial images of the abdomen and pelvis without oral or intravenous contrast were obtained.  Comparison: None  Findings:  Exam is limited for evaluation of entities other than urinary tract calculi due to lack of oral or intravenous contrast.   Lung bases:  Scarring at the right lung base with probable surgical changes.  Normal heart size with small pericardial effusion.  Small left pleural effusion. Multivessel coronary artery atherosclerosis.  Abdomen/pelvis:  Mild to moderate hepatic steatosis and hepatomegaly.  22.6 cm cranial caudal.  Normal spleen, stomach, pancreas,  biliary tract, adrenal glands. Gallbladder distended, without specific evidence of acute cholecystitis.  Moderate right-sided renal and perirenal edema.  No caliectasis or hydroureter.  No ureteric stone.  No retroperitoneal or retrocrural adenopathy.  Fluid-filled colon, suggesting a diarrheal state. Normal terminal ileum.  Appendix not identified.  Peripherally calcified structure in the anterior left pelvis on image 75/series 2 is favored to be the sequelae of remote fat necrosis (i.e. epiploic appendagitis).  Normal small bowel. No pneumatosis or free intraperitoneal air.  No pelvic adenopathy.  Normal urinary bladder and uterus.  No adnexal mass.  No significant free fluid.  Right paramidline umbilical hernia which contains fat.  Bones/Musculoskeletal:  No acute osseous abnormality.  Degenerative disc disease involves lumbosacral junction.  IMPRESSION:  1.  Right renal and perirenal edema.  This is suspicious for pyelonephritis.  Given lack of caliectasis or hydroureter, prior stone passage is felt less likely. 2.  Hepatic steatosis and hepatomegaly. 3. Age advanced coronary artery atherosclerosis.  Recommend assessment of coronary risk factors and consideration of medical therapy. 4.  Small pericardial and left-sided pleural effusions.   Original Report Authenticated By: Jeronimo Greaves, M.D.    Scheduled Meds: . cefTRIAXone (ROCEPHIN)  IV  1 g Intravenous Q24H  . enoxaparin (LOVENOX) injection  40 mg Subcutaneous Q24H  . insulin aspart  0-15 Units Subcutaneous TID WC  . insulin aspart  0-5 Units Subcutaneous QHS  . insulin aspart  3 Units Subcutaneous TID WC  . insulin glargine  60 Units Subcutaneous QHS  . magnesium sulfate 1 - 4 g bolus IVPB  2 g Intravenous Once  . potassium chloride  40 mEq Oral BID  . simvastatin  20 mg Oral q1800  . sodium chloride  3 mL Intravenous Q12H   Continuous Infusions: . sodium chloride 125 mL/hr at 02/15/13 2336  . sodium chloride 100 mL/hr at 02/17/13 0148     Principal Problem:   Pyelonephritis Active Problems:   Dehydration   Hyperglycemia   Increased anion gap metabolic acidosis   HTN (hypertension)   HLD (hyperlipidemia)   Diabetes mellitus type 2 in obese   Coronary atherosclerosis seen on CT   Nausea and vomiting   Diarrhea    Time spent: 25 min    Emily Farmer  [facility] Pager [phone] If 7PM-7AM, please contact night-coverage at [email or web address], password [email or web address] 02/17/2013, 10:11 AM  LOS: 2 days

## 2013-02-17 NOTE — Progress Notes (Signed)
9/4  Spoke with patient about her diabetes.  Sees Dr. Magdalen Spatz on [address] as her PCP.  Last seen in August.  States that her HgbA1C at that time was 6-7%.   Told her that the result of her HgbA1C on 9-3 was 13.4%.  Asked if she was taking her insulin and if she could afford it.  She stated "YES" that she was taking it and could afford it.  Noted that case management is following her.  Patient states that she has had outpatient education for diabetes.  Smith Mince RN BSN CDE

## 2013-02-18 ENCOUNTER — Inpatient Hospital Stay (HOSPITAL_COMMUNITY): Payer: Medicaid Other

## 2013-02-18 LAB — URINE CULTURE
Colony Count: NO GROWTH
Culture: NO GROWTH

## 2013-02-18 LAB — GLUCOSE, CAPILLARY
Glucose-Capillary: 146 mg/dL — ABNORMAL HIGH (ref 70–99)
Glucose-Capillary: 169 mg/dL — ABNORMAL HIGH (ref 70–99)
Glucose-Capillary: 201 mg/dL — ABNORMAL HIGH (ref 70–99)

## 2013-02-18 LAB — BASIC METABOLIC PANEL
BUN: 6 mg/dL (ref 6–23)
Creatinine, Ser: 0.47 mg/dL — ABNORMAL LOW (ref 0.50–1.10)
GFR calc Af Amer: >90 mL/min (ref >90)
GFR calc non Af Amer: >90 mL/min (ref >90)
Glucose, Bld: 158 mg/dL — ABNORMAL HIGH (ref 70–99)

## 2013-02-18 MED ORDER — POTASSIUM CHLORIDE 10 MEQ/100ML IV SOLN
10.0000 meq | INTRAVENOUS | Status: DC
  Filled 2013-02-18 (×2): qty 100

## 2013-02-18 MED ORDER — POTASSIUM CHLORIDE CRYS ER 20 MEQ PO TBCR
40.0000 meq | EXTENDED_RELEASE_TABLET | Freq: Three times a day (TID) | ORAL | Status: AC
  Administered 2013-02-18 (×3): 40 meq via ORAL
  Filled 2013-02-18 (×3): qty 2

## 2013-02-18 MED ORDER — MAGNESIUM SULFATE 40 MG/ML IJ SOLN
2.0000 g | Freq: Once | INTRAMUSCULAR | Status: AC
  Administered 2013-02-18: 10:00:00 2 g via INTRAVENOUS
  Filled 2013-02-18: qty 50

## 2013-02-18 NOTE — Progress Notes (Signed)
[facility] PROGRESS NOTE  Emily Farmer ZOX:[id number] DOB: [birth year] DOA: 02/15/2013 PCP: No primary provider on file.  Assessment/Plan: 1. Hyperglycemia with mild elevation of anion gap with ketones on UA. May be due to mild DKA vs. Ketosis from not eating with hyperglycemia secondary to not taking her insulin in a few days. RESOLVED.  - Lantus 60 units (home dose)  - start SSI achs.  - A1c is 13.4 CBG (last 3)   Recent Labs  02/18/13 0734 02/18/13 1154 02/18/13 1658  GLUCAP 135* 146* 169*    Added novolog 3 units TIDAC.  - Hold metformin due to diarrhea  2.Dehydration, likely secondary to persistent hyperglycemia  - Continue IVF  3. Possible pyelonephritis on CT  - Continue ceftriaxone  - urine cultures show 50,000 colonies and repeat Urine cultures ordered.  4. Nausea and vomiting and recent diarrhea, may be due to pyelonephritis  - zofran prn  - C. Diff negative. No diarrhea today.  HTN/HLD, blood pressure stable  Signs of CAD on CT  - Lipid panel shows LDL of 88 and HDL of 19.    Headache: no neck stiff ness. No focal deficits. No blurring of vision. CT head ordered.   Fever: unclear etiology . Ordered blood cultures. And repeated urine cultures.  Diet: Diabetic  Access: PIV  IVF: NS  Proph: lovenox   Code Status: full code Family Communication: none at bedside Disposition Plan: tomorrow.    Consultants:  none  Procedures:  none  Antibiotics:  Rocephin 9/2 for pyelonephritis.   HPI/Subjective: Comfortable. Mild abd pain.   Objective: Filed Vitals:   02/18/13 1400  BP: 127/71  Pulse: 75  Temp: 97.7 F (36.5 C)  Resp: 18    Intake/Output Summary (Last 24 hours) at 02/18/13 1836 Last data filed at 02/18/13 1300  Gross per 24 hour  Intake    530 ml  Output      0 ml  Net    530 ml   Filed Weights   02/16/13 0500 02/17/13 0454 02/18/13 0624  Weight: 73.8 kg (162 lb 11.2 oz) 75.4 kg (166 lb 3.6 oz) 76.3 kg (168 lb 3.4 oz)     Exam:   General:  Alert afebrile comfortable  Cardiovascular: s1s2  Respiratory: ctab no wheezing or rhonchi  Abdomen: soft mild tenderness in the supra pubic area  Musculoskeletal: no pedal edema.   Data Reviewed: Basic Metabolic Panel:  Recent Labs Lab 02/15/13 1900 02/16/13 0440 02/17/13 0915 02/18/13 0453  NA 127* 135 133* 133*  K 2.7* 2.7* 3.1* 3.2*  CL 89* 101 98 96  CO2 22 25 24 28   GLUCOSE 424* 170* 277* 158*  BUN 9 7 7 6   CREATININE 0.58 0.59 0.43* 0.47*  CALCIUM 9.3 8.2* 8.2* 8.3*  MG  --  1.2* 1.3* 1.5   Liver Function Tests:  Recent Labs Lab 02/15/13 1900  AST 14  ALT 11  ALKPHOS 97  BILITOT 0.7  PROT 7.4  ALBUMIN 2.8*   No results found for this basename: LIPASE, AMYLASE,  in the last 168 hours No results found for this basename: AMMONIA,  in the last 168 hours CBC:  Recent Labs Lab 02/15/13 1900 02/16/13 0440  WBC 6.7 4.7  NEUTROABS 4.9  --   HGB 12.5 10.9*  HCT 35.7* 32.3*  MCV 82.1 83.0  PLT 172 137*   Cardiac Enzymes: No results found for this basename: CKTOTAL, CKMB, CKMBINDEX, TROPONINI,  in the last 168 hours BNP (last 3 results) No  results found for this basename: PROBNP,  in the last 8760 hours CBG:  Recent Labs Lab 02/17/13 1706 02/17/13 2113 02/18/13 0734 02/18/13 1154 02/18/13 1658  GLUCAP 245* 209* 135* 146* 169*    Recent Results (from the past 240 hour(s))  CULTURE, BLOOD (ROUTINE X 2)     Status: None   Collection Time    02/15/13  7:00 PM      Result Value Range Status   Specimen Description BLOOD RIGHT WRIST   Final   Special Requests     Final   Value: BOTTLES DRAWN AEROBIC AND ANAEROBIC 5CC AER 7CC ANA   Culture  Setup Time     Final   Value: 02/16/2013 02:42     Performed at [facility]   Culture     Final   Value:        BLOOD CULTURE RECEIVED NO GROWTH TO DATE CULTURE WILL BE HELD FOR 5 DAYS BEFORE ISSUING A FINAL NEGATIVE REPORT     Performed at [facility]   Report  Status PENDING   Incomplete  CULTURE, BLOOD (ROUTINE X 2)     Status: None   Collection Time    02/15/13  7:51 PM      Result Value Range Status   Specimen Description BLOOD RIGHT HAND   Final   Special Requests BOTTLES DRAWN AEROBIC AND ANAEROBIC 5CC   Final   Culture  Setup Time     Final   Value: 02/16/2013 02:42     Performed at [facility]   Culture     Final   Value:        BLOOD CULTURE RECEIVED NO GROWTH TO DATE CULTURE WILL BE HELD FOR 5 DAYS BEFORE ISSUING A FINAL NEGATIVE REPORT     Performed at [facility]   Report Status PENDING   Incomplete  URINE CULTURE     Status: None   Collection Time    02/15/13  8:24 PM      Result Value Range Status   Specimen Description URINE, CLEAN CATCH   Final   Special Requests NONE   Final   Culture  Setup Time     Final   Value: 02/16/2013 05:27     Performed at [facility] Count     Final   Value: 50,000 COLONIES/ML     Performed at [facility]   Culture     Final   Value: Multiple bacterial morphotypes present, none predominant. Suggest appropriate recollection if clinically indicated.     Performed at [facility]   Report Status 02/17/2013 FINAL   Final  CLOSTRIDIUM DIFFICILE BY PCR     Status: None   Collection Time    02/16/13 10:00 AM      Result Value Range Status   C difficile by pcr NEGATIVE  NEGATIVE Final   Comment: Performed at [facility]  URINE CULTURE     Status: None   Collection Time    02/17/13  9:32 AM      Result Value Range Status   Specimen Description URINE, CLEAN CATCH   Final   Special Requests NONE   Final   Culture  Setup Time     Final   Value: 02/17/2013 13:15     Performed at [facility] Count     Final   Value: NO GROWTH     Performed at [facility]   Culture     Final   Value: NO GROWTH     Performed at [facility]   Report Status 02/18/2013 FINAL   Final     Studies: No results  found.  Scheduled Meds: . cefTRIAXone (ROCEPHIN)  IV  1 g Intravenous Q24H  . enoxaparin (LOVENOX) injection  40 mg Subcutaneous Q24H  . insulin aspart  0-15 Units Subcutaneous TID WC  . insulin aspart  0-5 Units Subcutaneous QHS  . insulin aspart  3 Units Subcutaneous TID WC  . insulin glargine  60 Units Subcutaneous QHS  . potassium chloride  40 mEq Oral TID  . simvastatin  20 mg Oral q1800  . sodium chloride  3 mL Intravenous Q12H   Continuous Infusions:    Principal Problem:   Pyelonephritis Active Problems:   Dehydration   Hyperglycemia   Increased anion gap metabolic acidosis   HTN (hypertension)   HLD (hyperlipidemia)   Diabetes mellitus type 2 in obese   Coronary atherosclerosis seen on CT   Nausea and vomiting   Diarrhea    Time spent: 25 min    Tracy Gerken  [facility] Pager [phone] If 7PM-7AM, please contact night-coverage at [email or web address], password [email or web address] 02/18/2013, 6:36 PM  LOS: 3 days

## 2013-02-18 NOTE — Progress Notes (Signed)
Inpatient Diabetes Program Recommendations  AACE/ADA: New Consensus Statement on Inpatient Glycemic Control (2013)  Target Ranges:  Prepandial:   less than 140 mg/dL      Peak postprandial:   less than 180 mg/dL (1-2 hours)      Critically ill patients:  140 - 180 mg/dL   Results for JENASCIA, BUMPASS (MRN [id number]) as of 02/18/2013 11:54  Ref. Range 02/17/2013 07:26 02/17/2013 11:37 02/17/2013 17:06 02/17/2013 21:13 02/18/2013 07:34  Glucose-Capillary Latest Range: 70-99 mg/dL 782 (H) 956 (H) 213 (H) 209 (H) 135 (H)    Inpatient Diabetes Program Recommendations Correction (SSI): Please consider increasing Novolog correction to resistant scale. Insulin - Meal Coverage: Please increase meal coverage to Novolog 5 units TID with meals (as long as patient eats at least 50% of meal).  Note: Blood glucose over the past 24 hours has ranged from 135-250 mg/dl and fasting glucose this morning was 135 mg/dl.  Postprandial glucose consistently elevated.  Please consider increasing Novolog correction to resistant scale and increase meal coverage to Novolog 5 units TID with meals.  Thanks, Orlando Penner, RN, MSN, CCRN Diabetes Coordinator Inpatient Diabetes Program [phone]

## 2013-02-19 LAB — BASIC METABOLIC PANEL
CO2: 30 mEq/L (ref 19–32)
Calcium: 9.2 mg/dL (ref 8.4–10.5)
Chloride: 95 mEq/L — ABNORMAL LOW (ref 96–112)
Glucose, Bld: 118 mg/dL — ABNORMAL HIGH (ref 70–99)
Sodium: 133 mEq/L — ABNORMAL LOW (ref 135–145)

## 2013-02-19 LAB — GLUCOSE, CAPILLARY: Glucose-Capillary: 122 mg/dL — ABNORMAL HIGH (ref 70–99)

## 2013-02-19 MED ORDER — ACETAMINOPHEN 325 MG PO TABS: 650.0000 mg | ORAL_TABLET | Freq: Four times a day (QID) | ORAL | Status: DC | PRN

## 2013-02-19 MED ORDER — CIPROFLOXACIN HCL 500 MG PO TABS: 500.0000 mg | ORAL_TABLET | Freq: Two times a day (BID) | ORAL | Status: DC

## 2013-02-19 NOTE — Progress Notes (Signed)
PATIENT'S PCP-DR. AVBUERE.PATIENT'S HOME IS SAFE. CSW HAD ALREADY ADDRESSED CONCERNS ABOUT PATIENT & HER MONEY ISSUES.NO FURTHER D/C NEEDS.

## 2013-02-19 NOTE — Discharge Summary (Signed)
Physician Discharge Summary  Emily Farmer ION:[id number] DOB: 01/28/1956 DOA: 02/15/2013  PCP: No primary provider on file.  Admit date: 02/15/2013 Discharge date: 02/19/2013  Time spent: 35 minutes  Recommendations for Outpatient Follow-up:  1. Follow up with PCP in one week 2. Follow up with hgba1c in 2 to 3 months.  3. Follow up with US kidney in 2 weeks evaluate for resolution of pyelonephritis.    Discharge Diagnoses:  Principal Problem:   Pyelonephritis Active Problems:   Dehydration   Hyperglycemia   Increased anion gap metabolic acidosis   HTN (hypertension)   HLD (hyperlipidemia)   Diabetes mellitus type 2 in obese   Coronary atherosclerosis seen on CT   Nausea and vomiting   Diarrhea   Discharge Condition: improved  Diet recommendation: carb modified diet  Filed Weights   02/17/13 0454 02/18/13 0624 02/19/13 0507  Weight: 75.4 kg (166 lb 3.6 oz) 76.3 kg (168 lb 3.4 oz) 74.753 kg (164 lb 12.8 oz)    History of present illness:  The patient is a 57 y.o. year-old female with history of T2DM, HTN, HLD who presents with one week history of chills and bilateral flank pain that radiates anteriorly to the epigastric area and down to the suprapubic area. The patient was last at their baseline health prior to one week ago. She has had associated nausea, vomiting (nonbilious and nonbloody), but denies dysuria. She has had increased frequency of urination without urgency. She has become progressively lightheaded and then developed watery diarrhea x 4 times so far today. She denies travel or antibiotics within the last 3 months. She is non compliant to medications sec to financial issues.  On arrival to ED, she underwent CT abd and pelvis showed right renal and perirenal edema suspicious for pyelonephritis. She was admitted by medical service and treated for pyelonephritis, hypokalemia. During th ehospitalization, she spiked fevers of unclear etiology on rocephin. Urine cultures first  set showed 50,000 colonies of multiple morpho sub types, second urine culture was negative. Blood cultures have been negative so far. CXR does not show any pneumonia. She had persistent headaches for 2to 3 days without neck stiff ness and her CT head was negative. She reports she takes 1 g of tylenol every 6 hours at home for headache, which makes suspicious for medication rebound headache. Recommended her to slowly decrease the dose of tylenol as there was evidence of hepatitis and steatosis on the CT abd pelvis.     Hospital Course:   1. Hyperglycemia with mild elevation of anion gap with ketones on UA. May be due to mild DKA vs. Ketosis from not eating with hyperglycemia secondary to not taking her insulin in a few days it has resolved.   - Lantus 60 units (home dose)  - start SSI achs.  - A1c is 13.4  CBG (last 3)   Recent Labs   02/18/13 0734  02/18/13 1154  02/18/13 1658   GLUCAP  135*  146*  169*     - metformin was held initially, which was continued on discharge.  2.Dehydration, likely secondary to persistent hyperglycemia  - Continue IVF  3. Possible pyelonephritis on CT  - Completed 5 days of rocephin  - urine cultures show 50,000 colonies and repeat Urine cultures ordered and are negative.   4. Nausea and vomiting and recent diarrhea, may be due to pyelonephritis  - zofran prn has resolved .  - C. Diff negative. No diarrhea .  HTN/HLD, blood pressure stable  Signs  of CAD on CT  - Lipid panel shows LDL of 88 and HDL of 19.  Headache: no neck stiff ness. No focal deficits.  No change in mental status. No blurring of vision. CT head ordered and negative.     Procedures:  Ct abd  Ct head with out contrast.   Consultations:  NONE  Discharge Exam: Filed Vitals:   02/19/13 0507  BP: 140/75  Pulse: 82  Temp: 99.2 F (37.3 C)  Resp: 20    General: alert afebrile comfortable Cardiovascular: s1s2 Respiratory: CTAB  Discharge Instructions  Discharge Orders    Future Orders Complete By Expires   Discharge instructions  As directed    Comments:     Follow up with PCP in one week.       Medication List         acetaminophen 325 MG tablet  Commonly known as:  TYLENOL  Take 2 tablets (650 mg total) by mouth every 6 (six) hours as needed.     ciprofloxacin 500 MG tablet  Commonly known as:  CIPRO  Take 1 tablet (500 mg total) by mouth 2 (two) times daily.     insulin glargine 100 UNIT/ML injection  Commonly known as:  LANTUS  Inject 60 Units into the skin at bedtime.     metFORMIN 1000 MG tablet  Commonly known as:  GLUCOPHAGE  Take 1,000 mg by mouth 2 (two) times daily with a meal.     niacin-simvastatin 500-20 MG 24 hr tablet  Commonly known as:  SIMCOR  Take 1 tablet by mouth at bedtime.       No Known Allergies    The results of significant diagnostics from this hospitalization (including imaging, microbiology, ancillary and laboratory) are listed below for reference.    Significant Diagnostic Studies: Ct Abdomen Pelvis Wo Contrast  02/15/2013   *RADIOLOGY REPORT*  Clinical Data:  Bilateral flank pain.  Left lower quadrant pain and fever.  CT ABDOMEN AND PELVIS WITHOUT CONTRAST (CT UROGRAM)  Technique: Contiguous axial images of the abdomen and pelvis without oral or intravenous contrast were obtained.  Comparison: None  Findings:  Exam is limited for evaluation of entities other than urinary tract calculi due to lack of oral or intravenous contrast.   Lung bases:  Scarring at the right lung base with probable surgical changes.  Normal heart size with small pericardial effusion.  Small left pleural effusion. Multivessel coronary artery atherosclerosis.  Abdomen/pelvis:  Mild to moderate hepatic steatosis and hepatomegaly.  22.6 cm cranial caudal.  Normal spleen, stomach, pancreas, biliary tract, adrenal glands. Gallbladder distended, without specific evidence of acute cholecystitis.  Moderate right-sided renal and perirenal edema.   No caliectasis or hydroureter.  No ureteric stone.  No retroperitoneal or retrocrural adenopathy.  Fluid-filled colon, suggesting a diarrheal state. Normal terminal ileum.  Appendix not identified.  Peripherally calcified structure in the anterior left pelvis on image 75/series 2 is favored to be the sequelae of remote fat necrosis (i.e. epiploic appendagitis).  Normal small bowel. No pneumatosis or free intraperitoneal air.  No pelvic adenopathy.  Normal urinary bladder and uterus.  No adnexal mass.  No significant free fluid.  Right paramidline umbilical hernia which contains fat.  Bones/Musculoskeletal:  No acute osseous abnormality.  Degenerative disc disease involves lumbosacral junction.  IMPRESSION:  1.  Right renal and perirenal edema.  This is suspicious for pyelonephritis.  Given lack of caliectasis or hydroureter, prior stone passage is felt less likely. 2.  Hepatic steatosis and  hepatomegaly. 3. Age advanced coronary artery atherosclerosis.  Recommend assessment of coronary risk factors and consideration of medical therapy. 4.  Small pericardial and left-sided pleural effusions.   Original Report Authenticated By: Jeronimo Greaves, M.D.   Ct Head Wo Contrast  02/18/2013   *RADIOLOGY REPORT*  Clinical Data: Persistent headache for 3 days  CT HEAD WITHOUT CONTRAST  Technique:  Contiguous axial images were obtained from the base of the skull through the vertex without contrast.  Comparison: None.  Findings: Head No acute intracranial hemorrhage.  No focal mass lesion.  No CT evidence of acute infarction.   No midline shift or mass effect.  No hydrocephalus.  Basilar cisterns are patent.  Paranasal sinuses and mastoid air cells are clear.  Orbits are normal.  IMPRESSION: No acute intracranial findings.   Original Report Authenticated By: Genevive Bi, M.D.    Microbiology: Recent Results (from the past 240 hour(s))  CULTURE, BLOOD (ROUTINE X 2)     Status: None   Collection Time    02/15/13  7:00 PM       Result Value Range Status   Specimen Description BLOOD RIGHT WRIST   Final   Special Requests     Final   Value: BOTTLES DRAWN AEROBIC AND ANAEROBIC 5CC AER 7CC ANA   Culture  Setup Time     Final   Value: 02/16/2013 02:42     Performed at [facility]   Culture     Final   Value:        BLOOD CULTURE RECEIVED NO GROWTH TO DATE CULTURE WILL BE HELD FOR 5 DAYS BEFORE ISSUING A FINAL NEGATIVE REPORT     Performed at [facility]   Report Status PENDING   Incomplete  CULTURE, BLOOD (ROUTINE X 2)     Status: None   Collection Time    02/15/13  7:51 PM      Result Value Range Status   Specimen Description BLOOD RIGHT HAND   Final   Special Requests BOTTLES DRAWN AEROBIC AND ANAEROBIC 5CC   Final   Culture  Setup Time     Final   Value: 02/16/2013 02:42     Performed at [facility]   Culture     Final   Value:        BLOOD CULTURE RECEIVED NO GROWTH TO DATE CULTURE WILL BE HELD FOR 5 DAYS BEFORE ISSUING A FINAL NEGATIVE REPORT     Performed at [facility]   Report Status PENDING   Incomplete  URINE CULTURE     Status: None   Collection Time    02/15/13  8:24 PM      Result Value Range Status   Specimen Description URINE, CLEAN CATCH   Final   Special Requests NONE   Final   Culture  Setup Time     Final   Value: 02/16/2013 05:27     Performed at [facility] Count     Final   Value: 50,000 COLONIES/ML     Performed at [facility]   Culture     Final   Value: Multiple bacterial morphotypes present, none predominant. Suggest appropriate recollection if clinically indicated.     Performed at [facility]   Report Status 02/17/2013 FINAL   Final  CLOSTRIDIUM DIFFICILE BY PCR     Status: None   Collection Time    02/16/13 10:00 AM      Result Value  Range Status   C difficile by pcr NEGATIVE  NEGATIVE Final   Comment: Performed at [facility]  URINE CULTURE     Status: None   Collection Time     02/17/13  9:32 AM      Result Value Range Status   Specimen Description URINE, CLEAN CATCH   Final   Special Requests NONE   Final   Culture  Setup Time     Final   Value: 02/17/2013 13:15     Performed at [facility]   Colony Count     Final   Value: NO GROWTH     Performed at [facility]   Culture     Final   Value: NO GROWTH     Performed at [facility]   Report Status 02/18/2013 FINAL   Final     Labs: Basic Metabolic Panel:  Recent Labs Lab 02/15/13 1900 02/16/13 0440 02/17/13 0915 02/18/13 0453 02/19/13 0538  NA 127* 135 133* 133* 133*  K 2.7* 2.7* 3.1* 3.2* 4.1  CL 89* 101 98 96 95*  CO2 22 25 24 28 30   GLUCOSE 424* 170* 277* 158* 118*  BUN 9 7 7 6 7   CREATININE 0.58 0.59 0.43* 0.47* 0.43*  CALCIUM 9.3 8.2* 8.2* 8.3* 9.2  MG  --  1.2* 1.3* 1.5  --    Liver Function Tests:  Recent Labs Lab 02/15/13 1900  AST 14  ALT 11  ALKPHOS 97  BILITOT 0.7  PROT 7.4  ALBUMIN 2.8*   No results found for this basename: LIPASE, AMYLASE,  in the last 168 hours No results found for this basename: AMMONIA,  in the last 168 hours CBC:  Recent Labs Lab 02/15/13 1900 02/16/13 0440  WBC 6.7 4.7  NEUTROABS 4.9  --   HGB 12.5 10.9*  HCT 35.7* 32.3*  MCV 82.1 83.0  PLT 172 137*   Cardiac Enzymes: No results found for this basename: CKTOTAL, CKMB, CKMBINDEX, TROPONINI,  in the last 168 hours BNP: BNP (last 3 results) No results found for this basename: PROBNP,  in the last 8760 hours CBG:  Recent Labs Lab 02/18/13 0734 02/18/13 1154 02/18/13 1658 02/18/13 2109 02/19/13 0714  GLUCAP 135* 146* 169* 201* 122*       Signed:  Maryhelen Lindler  [facility] 02/19/2013, 9:55 AM

## 2013-02-22 LAB — CULTURE, BLOOD (ROUTINE X 2)
Culture: NO GROWTH
Culture: NO GROWTH

## 2013-02-24 LAB — CULTURE, BLOOD (ROUTINE X 2): Culture: NO GROWTH

## 2014-01-03 ENCOUNTER — Encounter (HOSPITAL_COMMUNITY): Payer: Self-pay | Admitting: Emergency Medicine

## 2014-01-03 ENCOUNTER — Observation Stay (HOSPITAL_COMMUNITY)
Admission: EM | Admit: 2014-01-03 | Discharge: 2014-01-05 | Disposition: A | Discharge status: Home / Self Care | Payer: Medicaid Other | Attending: General Surgery | Admitting: General Surgery

## 2014-01-03 DIAGNOSIS — K810 Acute cholecystitis: Secondary | ICD-10-CM

## 2014-01-03 DIAGNOSIS — E1310 Other specified diabetes mellitus with ketoacidosis without coma: Secondary | ICD-10-CM | POA: Diagnosis not present

## 2014-01-03 DIAGNOSIS — E785 Hyperlipidemia, unspecified: Secondary | ICD-10-CM | POA: Insufficient documentation

## 2014-01-03 DIAGNOSIS — E1169 Type 2 diabetes mellitus with other specified complication: Secondary | ICD-10-CM | POA: Diagnosis present

## 2014-01-03 DIAGNOSIS — Z6828 Body mass index (BMI) 28.0-28.9, adult: Secondary | ICD-10-CM | POA: Insufficient documentation

## 2014-01-03 DIAGNOSIS — E872 Acidosis: Secondary | ICD-10-CM

## 2014-01-03 DIAGNOSIS — K8000 Calculus of gallbladder with acute cholecystitis without obstruction: Principal | ICD-10-CM | POA: Insufficient documentation

## 2014-01-03 DIAGNOSIS — E669 Obesity, unspecified: Secondary | ICD-10-CM | POA: Diagnosis not present

## 2014-01-03 DIAGNOSIS — Z794 Long term (current) use of insulin: Secondary | ICD-10-CM | POA: Diagnosis not present

## 2014-01-03 DIAGNOSIS — I10 Essential (primary) hypertension: Secondary | ICD-10-CM | POA: Diagnosis not present

## 2014-01-03 DIAGNOSIS — R739 Hyperglycemia, unspecified: Secondary | ICD-10-CM

## 2014-01-03 DIAGNOSIS — R1011 Right upper quadrant pain: Secondary | ICD-10-CM | POA: Diagnosis present

## 2014-01-03 DIAGNOSIS — E1110 Type 2 diabetes mellitus with ketoacidosis without coma: Secondary | ICD-10-CM

## 2014-01-03 DIAGNOSIS — E8729 Other acidosis: Secondary | ICD-10-CM | POA: Diagnosis present

## 2014-01-03 DIAGNOSIS — E876 Hypokalemia: Secondary | ICD-10-CM | POA: Diagnosis not present

## 2014-01-03 LAB — CBC WITH DIFFERENTIAL/PLATELET
Basophils Absolute: 0 10*3/uL (ref 0.0–0.1)
Basophils Relative: 0 % (ref 0–1)
EOS PCT: 1 % (ref 0–5)
Eosinophils Absolute: 0.1 10*3/uL (ref 0.0–0.7)
HEMATOCRIT: 38.6 % (ref 36.0–46.0)
HEMOGLOBIN: 13.4 g/dL (ref 12.0–15.0)
LYMPHS ABS: 2.5 10*3/uL (ref 0.7–4.0)
LYMPHS PCT: 22 % (ref 12–46)
MCH: 28.6 pg (ref 26.0–34.0)
MCHC: 34.7 g/dL (ref 30.0–36.0)
MCV: 82.5 fL (ref 78.0–100.0)
MONO ABS: 0.9 10*3/uL (ref 0.1–1.0)
MONOS PCT: 8 % (ref 3–12)
Neutro Abs: 7.8 10*3/uL — ABNORMAL HIGH (ref 1.7–7.7)
Neutrophils Relative %: 69 % (ref 43–77)
Platelets: 198 10*3/uL (ref 150–400)
RBC: 4.68 MIL/uL (ref 3.87–5.11)
RDW: 12.8 % (ref 11.5–15.5)
WBC: 11.3 10*3/uL — AB (ref 4.0–10.5)

## 2014-01-03 LAB — COMPREHENSIVE METABOLIC PANEL
ALT: 10 U/L (ref 0–35)
AST: 9 U/L (ref 0–37)
Albumin: 3.7 g/dL (ref 3.5–5.2)
Alkaline Phosphatase: 77 U/L (ref 39–117)
Anion gap: 19 — ABNORMAL HIGH (ref 5–15)
BUN: 13 mg/dL (ref 6–23)
CALCIUM: 9.6 mg/dL (ref 8.4–10.5)
CO2: 23 meq/L (ref 19–32)
CREATININE: 0.46 mg/dL — AB (ref 0.50–1.10)
Chloride: 88 mEq/L — ABNORMAL LOW (ref 96–112)
GLUCOSE: 397 mg/dL — AB (ref 70–99)
Potassium: 4 mEq/L (ref 3.7–5.3)
SODIUM: 130 meq/L — AB (ref 137–147)
Total Bilirubin: 0.5 mg/dL (ref 0.3–1.2)
Total Protein: 7.7 g/dL (ref 6.0–8.3)

## 2014-01-03 LAB — URINALYSIS, ROUTINE W REFLEX MICROSCOPIC
Bilirubin Urine: NEGATIVE
Hgb urine dipstick: NEGATIVE
KETONES UR: 15 mg/dL — AB
Nitrite: NEGATIVE
PROTEIN: NEGATIVE mg/dL
Specific Gravity, Urine: 1.01 (ref 1.005–1.030)
Urobilinogen, UA: 0.2 mg/dL (ref 0.0–1.0)
pH: 5 (ref 5.0–8.0)

## 2014-01-03 LAB — URINE MICROSCOPIC-ADD ON

## 2014-01-03 LAB — LIPASE, BLOOD: Lipase: 28 U/L (ref 11–59)

## 2014-01-03 LAB — CBG MONITORING, ED: GLUCOSE-CAPILLARY: 344 mg/dL — AB (ref 70–99)

## 2014-01-03 MED ORDER — SODIUM CHLORIDE 0.9 % IV BOLUS (SEPSIS)
1000.0000 mL | Freq: Once | INTRAVENOUS | Status: AC
  Administered 2014-01-03: 1000 mL via INTRAVENOUS

## 2014-01-03 MED ORDER — KETOROLAC TROMETHAMINE 30 MG/ML IJ SOLN
30.0000 mg | Freq: Once | INTRAMUSCULAR | Status: AC
  Administered 2014-01-03: 30 mg via INTRAVENOUS
  Filled 2014-01-03: qty 1

## 2014-01-03 MED ORDER — IOHEXOL 300 MG/ML  SOLN
25.0000 mL | Freq: Once | INTRAMUSCULAR | Status: AC | PRN
  Administered 2014-01-03: 25 mL via ORAL

## 2014-01-03 MED ORDER — ONDANSETRON HCL 4 MG/2ML IJ SOLN
4.0000 mg | Freq: Once | INTRAMUSCULAR | Status: AC
  Administered 2014-01-03: 4 mg via INTRAVENOUS
  Filled 2014-01-03: qty 2

## 2014-01-03 MED ORDER — INSULIN ASPART 100 UNIT/ML ~~LOC~~ SOLN
10.0000 [IU] | Freq: Once | SUBCUTANEOUS | Status: AC
  Administered 2014-01-03: 10 [IU] via INTRAVENOUS
  Filled 2014-01-03: qty 1

## 2014-01-03 MED ORDER — SODIUM CHLORIDE 0.9 % IV BOLUS (SEPSIS)
1000.0000 mL | Freq: Once | INTRAVENOUS | Status: AC
  Administered 2014-01-04: 1000 mL via INTRAVENOUS

## 2014-01-03 NOTE — ED Provider Notes (Signed)
CSN: [id number]     Arrival date & time 01/03/14  1911 History   First MD Initiated Contact with Patient 01/03/14 2230     Chief Complaint  Patient presents with  . Abdominal Pain   HPI  History provided by the patient. Patient is a 58 year old female with history of hypertension, hyperlipidemia and diabetes presenting with worsened diffuse abdominal pain. Symptoms first began with some intermittent sharp pains Saturday evening. They persisted all day Sunday and Monday. They became much worse today. She reports having slight diarrhea with associated with symptoms without blood or mucus. Denies any constipation. Denies any nausea or vomiting. No fever, chills or sweats. Denies any urinary changes. No other aggravating or alleviating factors. No other associated symptoms.    Past Medical History  Diagnosis Date  . Diabetes mellitus   . Hypertension   . Gout   . Hyperlipidemia    Past Surgical History  Procedure Laterality Date  . Lung surgery     Family History  Problem Relation Age of Onset  . Diabetes Sister   . High blood pressure Neg Hx   . High Cholesterol Neg Hx   . Heart attack Neg Hx    History  Substance Use Topics  . Smoking status: Never Smoker   . Smokeless tobacco: Not on file  . Alcohol Use: No   OB History   Grav Para Term Preterm Abortions TAB SAB Ect Mult Living                 Review of Systems  Constitutional: Negative for fever, chills and diaphoresis.  Gastrointestinal: Positive for abdominal pain and diarrhea. Negative for nausea, vomiting and constipation.  Genitourinary: Negative for dysuria, frequency, hematuria and flank pain.  All other systems reviewed and are negative.     Allergies  Review of patient's allergies indicates no known allergies.  Home Medications   Prior to Admission medications   Medication Sig Start Date End Date Taking? Authorizing Provider  insulin glargine (LANTUS) 100 UNIT/ML injection Inject 60 Units into the  skin at bedtime.   Yes Historical Provider, MD  metFORMIN (GLUCOPHAGE) 1000 MG tablet Take 1,000 mg by mouth 2 (two) times daily with a meal.     Yes Historical Provider, MD  niacin-simvastatin (SIMCOR) 500-20 MG 24 hr tablet Take 1 tablet by mouth at bedtime.   Yes Historical Provider, MD   BP 140/82  Pulse 90  Temp(Src) 97.6 F (36.4 C) (Oral)  Resp 22  Ht 5\' 3"  (1.6 m)  Wt 160 lb (72.576 kg)  BMI 28.35 kg/m2  SpO2 93% Physical Exam  Nursing note and vitals reviewed. Constitutional: She is oriented to person, place, and time. She appears well-developed and well-nourished. No distress.  HENT:  Head: Normocephalic.  Cardiovascular: Normal rate and regular rhythm.   Pulmonary/Chest: Effort normal and breath sounds normal. No respiratory distress.  Abdominal: Soft. She exhibits no distension. There is tenderness. There is guarding. There is no rebound.  Diffuse abdominal tenderness.  Musculoskeletal: Normal range of motion.  Neurological: She is alert and oriented to person, place, and time.  Skin: Skin is warm and dry. No rash noted.  Psychiatric: She has a normal mood and affect. Her behavior is normal.    ED Course  Procedures   COORDINATION OF CARE:  Nursing notes reviewed. Vital signs reviewed. Initial pt interview and examination performed.   Filed Vitals:   01/03/14 1915 01/03/14 2246  BP: 159/80 140/82  Pulse: 113 90  Temp:  97.6 F (36.4 C)   TempSrc: Oral   Resp: 22   Height: 5\' 3"  (1.6 m)   Weight: 160 lb (72.576 kg)   SpO2: 98% 93%    11:05 PM-patient seen and evaluated. Patient does not appear in significant pain or discomfort. Is having diffuse abdominal tenderness. It reports one episode of diarrhea without blood or mucus. Pain for several days.  Pt feeling better after medicine.   12:50AM Spoke with radiologist. CT with signs of acute cholecystitis. Pt has elevated WBC but normal LFTs. Will plan to consult gen surgery.  1:20AM Spoke with Dr.  Dwain Sarna with gen surg. He would like US performed and then call back.  3:00AM Spoke with Dr. Dwain Sarna regarding [place] results. He will come to see pt.   Treatment plan initiated: Medications  sodium chloride 0.9 % bolus 1,000 mL (not administered)  insulin aspart (novoLOG) injection 10 Units (not administered)  ketorolac (TORADOL) 30 MG/ML injection 30 mg (not administered)  ondansetron (ZOFRAN) injection 4 mg (not administered)    Results for orders placed during the hospital encounter of 01/03/14  CBC WITH DIFFERENTIAL      Result Value Ref Range   WBC 11.3 (*) 4.0 - 10.5 K/uL   RBC 4.68  3.87 - 5.11 MIL/uL   Hemoglobin 13.4  12.0 - 15.0 g/dL   HCT 16.1  09.6 - 04.5 %   MCV 82.5  78.0 - 100.0 fL   MCH 28.6  26.0 - 34.0 pg   MCHC 34.7  30.0 - 36.0 g/dL   RDW 40.9  81.1 - 91.4 %   Platelets 198  150 - 400 K/uL   Neutrophils Relative % 69  43 - 77 %   Neutro Abs 7.8 (*) 1.7 - 7.7 K/uL   Lymphocytes Relative 22  12 - 46 %   Lymphs Abs 2.5  0.7 - 4.0 K/uL   Monocytes Relative 8  3 - 12 %   Monocytes Absolute 0.9  0.1 - 1.0 K/uL   Eosinophils Relative 1  0 - 5 %   Eosinophils Absolute 0.1  0.0 - 0.7 K/uL   Basophils Relative 0  0 - 1 %   Basophils Absolute 0.0  0.0 - 0.1 K/uL  COMPREHENSIVE METABOLIC PANEL      Result Value Ref Range   Sodium 130 (*) 137 - 147 mEq/L   Potassium 4.0  3.7 - 5.3 mEq/L   Chloride 88 (*) 96 - 112 mEq/L   CO2 23  19 - 32 mEq/L   Glucose, Bld 397 (*) 70 - 99 mg/dL   BUN 13  6 - 23 mg/dL   Creatinine, Ser 7.82 (*) 0.50 - 1.10 mg/dL   Calcium 9.6  8.4 - 95.6 mg/dL   Total Protein 7.7  6.0 - 8.3 g/dL   Albumin 3.7  3.5 - 5.2 g/dL   AST 9  0 - 37 U/L   ALT 10  0 - 35 U/L   Alkaline Phosphatase 77  39 - 117 U/L   Total Bilirubin 0.5  0.3 - 1.2 mg/dL   GFR calc non Af Amer >90  >90 mL/min   GFR calc Af Amer >90  >90 mL/min   Anion gap 19 (*) 5 - 15  LIPASE, BLOOD      Result Value Ref Range   Lipase 28  11 - 59 U/L  URINALYSIS, ROUTINE W  REFLEX MICROSCOPIC      Result Value Ref Range   Color, Urine YELLOW  YELLOW   APPearance CLEAR  CLEAR   Specific Gravity, Urine 1.010  1.005 - 1.030   pH 5.0  5.0 - 8.0   Glucose, UA >1000 (*) NEGATIVE mg/dL   Hgb urine dipstick NEGATIVE  NEGATIVE   Bilirubin Urine NEGATIVE  NEGATIVE   Ketones, ur 15 (*) NEGATIVE mg/dL   Protein, ur NEGATIVE  NEGATIVE mg/dL   Urobilinogen, UA 0.2  0.0 - 1.0 mg/dL   Nitrite NEGATIVE  NEGATIVE   Leukocytes, UA TRACE (*) NEGATIVE  URINE MICROSCOPIC-ADD ON      Result Value Ref Range   Squamous Epithelial / LPF FEW (*) RARE   WBC, UA 3-6  <3 WBC/hpf   RBC / HPF 0-2  <3 RBC/hpf   Bacteria, UA RARE  RARE   Urine-Other RARE YEAST    BASIC METABOLIC PANEL      Result Value Ref Range   Sodium 132 (*) 137 - 147 mEq/L   Potassium 3.5 (*) 3.7 - 5.3 mEq/L   Chloride 96  96 - 112 mEq/L   CO2 23  19 - 32 mEq/L   Glucose, Bld 259 (*) 70 - 99 mg/dL   BUN 10  6 - 23 mg/dL   Creatinine, Ser 1.610.40 (*) 0.50 - 1.10 mg/dL   Calcium 8.2 (*) 8.4 - 10.5 mg/dL   GFR calc non Af Amer >90  >90 mL/min   GFR calc Af Amer >90  >90 mL/min   Anion gap 13  5 - 15  CBG MONITORING, ED      Result Value Ref Range   Glucose-Capillary 344 (*) 70 - 99 mg/dL  I-STAT VENOUS BLOOD GAS, ED      Result Value Ref Range   pH, Ven 7.353 (*) 7.250 - 7.300   pCO2, Ven 44.9 (*) 45.0 - 50.0 mmHg   pO2, Ven 27.0 (*) 30.0 - 45.0 mmHg   Bicarbonate 24.9 (*) 20.0 - 24.0 mEq/L   TCO2 26  0 - 100 mmol/L   O2 Saturation 47.0     Acid-base deficit 1.0  0.0 - 2.0 mmol/L   Sample type VENOUS     Comment NOTIFIED PHYSICIAN    CBG MONITORING, ED      Result Value Ref Range   Glucose-Capillary 241 (*) 70 - 99 mg/dL     Imaging Review Ct Abdomen Pelvis W Contrast  01/04/2014   CLINICAL DATA:  Diffuse abdominal pain radiating to back. Evaluate for diverticulitis.  EXAM: CT ABDOMEN AND PELVIS WITH CONTRAST  TECHNIQUE: Multidetector CT imaging of the abdomen and pelvis was performed using the  standard protocol following bolus administration of intravenous contrast.  CONTRAST:  100mL OMNIPAQUE IOHEXOL 300 MG/ML  SOLN  COMPARISON:  CT of the abdomen and pelvis February 15, 2013  FINDINGS: LUNG BASES: Status post right lower lobe apparent wedge resection. Minimal left lung base pleural thickening without pleural effusions or focal consolidations there are coronary artery calcifications partly imaged. Heart size is normal, small, slightly decreased pericardial effusion.  SOLID ORGANS: The spleen, pancreas and adrenal glands are unremarkable. Gallbladder distended, with gallbladder wall thickening, pericholecystic fluid and inflammatory changes. No CT findings of cholelithiasis. The liver is mildly enlarged, diffusely hypodense most consistent with hepatic steatosis.  GASTROINTESTINAL TRACT: The stomach, small and large bowel are normal in course and caliber without inflammatory changes. Enteric contrast has not yet reached the distal small bowel. Normal appendix.  KIDNEYS/ URINARY TRACT: Kidneys are orthotopic, demonstrating symmetric enhancement. No nephrolithiasis, hydronephrosis or renal masses. Minimal  cortical scarring of the right kidney. The unopacified ureters are normal in course and caliber. Delayed imaging through the kidneys demonstrates symmetric prompt excretion to the proximal urinary collecting system. Urinary bladder is partially distended and unremarkable.  PERITONEUM/RETROPERITONEUM: No intraperitoneal free air. Aortoiliac vessels are normal in course and caliber, minimal calcific atherosclerosis. No lymphadenopathy by CT size criteria. Internal reproductive organs are unremarkable. Stable 2.6 cm calcified fatty mass in pelvis may reflect remote omental infarct or epiploic appendagitis.  SOFT TISSUE/OSSEOUS STRUCTURES: Nonsuspicious. Severe L5-S1 degenerative disc disease resulting in severe right L5-S1 neural foraminal narrowing.  IMPRESSION: Acute cholecystitis.  No CT findings of  cholelithiasis.  Right renal cortical scarring (which may be secondary to patient's prior pyelonephritis).  Stable mild hepatomegaly and fatty liver.  Findings discussed with and reconfirmed by Dr.Sharnice Bosler on7/22/2015at12:50 am.   Electronically Signed   By: Awilda Metro   On: 01/04/2014 00:51   US Abdomen Limited Ruq  01/04/2014   CLINICAL DATA:  Right upper quadrant abdominal pain.  EXAM: US ABDOMEN LIMITED - RIGHT UPPER QUADRANT  COMPARISON:  CT of the abdomen and pelvis performed earlier today at 12:31 a.m.  FINDINGS: Gallbladder:  Distended, with numerous layering stones seen dependently in the gallbladder, measuring up to 1.3 cm in size. Underlying echogenic sludge is noted. Significant gallbladder wall thickening is seen, with pericholecystic fluid. A positive ultrasonographic Murphy's sign is elicited.  Common bile duct:  Diameter: 0.4 cm, within normal limits in caliber.  Liver:  A 2.8 x 2.5 x 2.1 cm focus of decreased echogenicity adjacent to the gallbladder is thought to reflect focal fatty sparing. Diffuse fatty infiltration is noted within the liver, with diffusely increased echogenicity and coarsened echotexture.  IMPRESSION: 1. Acute cholecystitis noted, with cholelithiasis, gallbladder distention, significant gallbladder wall thickening and pericholecystic fluid. Positive ultrasonographic Murphy's sign elicited. The common hepatic duct remains normal in caliber. 2. Diffuse fatty infiltration within the liver. 2.8 cm focus of decreased echogenicity adjacent to the gallbladder is thought to reflect focal fatty sparing.   Electronically Signed   By: Roanna Raider M.D.   On: 01/04/2014 02:21    MDM   Final diagnoses:  Acute cholecystitis        Angus Seller, PA-C 01/04/14 0310

## 2014-01-03 NOTE — ED Notes (Signed)
Reports upper right quad abdominal pain radiates to back since Saturday worsening as each day goes by.  No BM in 3 days.  + nausea, no vomiting.

## 2014-01-03 NOTE — ED Notes (Addendum)
Pt. reports mid abdominal /mid back pain onset Saturday with diarrhea x1 , denies nausea or vomitting , no fever or chills / no urinary discomfort.

## 2014-01-04 ENCOUNTER — Encounter (HOSPITAL_COMMUNITY): Payer: Medicaid Other | Admitting: Certified Registered Nurse Anesthetist

## 2014-01-04 ENCOUNTER — Encounter (HOSPITAL_COMMUNITY)
Admission: EM | Disposition: A | Discharge status: Home / Self Care | Payer: Self-pay | Source: Home / Self Care | Attending: Emergency Medicine

## 2014-01-04 ENCOUNTER — Observation Stay (HOSPITAL_COMMUNITY): Payer: Medicaid Other | Admitting: Certified Registered Nurse Anesthetist

## 2014-01-04 ENCOUNTER — Encounter (HOSPITAL_COMMUNITY): Payer: Self-pay

## 2014-01-04 ENCOUNTER — Observation Stay (HOSPITAL_COMMUNITY): Payer: Medicaid Other

## 2014-01-04 ENCOUNTER — Emergency Department (HOSPITAL_COMMUNITY): Payer: Medicaid Other

## 2014-01-04 DIAGNOSIS — K810 Acute cholecystitis: Secondary | ICD-10-CM

## 2014-01-04 DIAGNOSIS — E669 Obesity, unspecified: Secondary | ICD-10-CM | POA: Diagnosis not present

## 2014-01-04 DIAGNOSIS — I10 Essential (primary) hypertension: Secondary | ICD-10-CM

## 2014-01-04 DIAGNOSIS — E872 Acidosis, unspecified: Secondary | ICD-10-CM

## 2014-01-04 DIAGNOSIS — E1310 Other specified diabetes mellitus with ketoacidosis without coma: Secondary | ICD-10-CM | POA: Diagnosis not present

## 2014-01-04 DIAGNOSIS — R7309 Other abnormal glucose: Secondary | ICD-10-CM

## 2014-01-04 DIAGNOSIS — E876 Hypokalemia: Secondary | ICD-10-CM | POA: Diagnosis present

## 2014-01-04 DIAGNOSIS — K8000 Calculus of gallbladder with acute cholecystitis without obstruction: Secondary | ICD-10-CM | POA: Diagnosis present

## 2014-01-04 DIAGNOSIS — K8010 Calculus of gallbladder with chronic cholecystitis without obstruction: Secondary | ICD-10-CM

## 2014-01-04 HISTORY — PX: CHOLECYSTECTOMY: SHX55

## 2014-01-04 LAB — BASIC METABOLIC PANEL
ANION GAP: 13 (ref 5–15)
Anion gap: 15 (ref 5–15)
Anion gap: 12 (ref 5–15)
BUN: 10 mg/dL (ref 6–23)
BUN: 10 mg/dL (ref 6–23)
BUN: 8 mg/dL (ref 6–23)
CALCIUM: 7.9 mg/dL — AB (ref 8.4–10.5)
CHLORIDE: 96 meq/L (ref 96–112)
CHLORIDE: 102 meq/L (ref 96–112)
CO2: 23 mEq/L (ref 19–32)
CO2: 23 mEq/L (ref 19–32)
CO2: 23 mEq/L (ref 19–32)
Calcium: 8.2 mg/dL — ABNORMAL LOW (ref 8.4–10.5)
Calcium: 7.6 mg/dL — ABNORMAL LOW (ref 8.4–10.5)
Chloride: 97 mEq/L (ref 96–112)
Creatinine, Ser: 0.4 mg/dL — ABNORMAL LOW (ref 0.50–1.10)
Creatinine, Ser: 0.42 mg/dL — ABNORMAL LOW (ref 0.50–1.10)
Creatinine, Ser: 0.48 mg/dL — ABNORMAL LOW (ref 0.50–1.10)
GFR calc non Af Amer: >90 mL/min (ref >90)
GFR calc non Af Amer: >90 mL/min (ref >90)
GFR calc non Af Amer: >90 mL/min (ref >90)
GLUCOSE: 275 mg/dL — AB (ref 70–99)
GLUCOSE: 150 mg/dL — AB (ref 70–99)
Glucose, Bld: 259 mg/dL — ABNORMAL HIGH (ref 70–99)
POTASSIUM: 3.3 meq/L — AB (ref 3.7–5.3)
POTASSIUM: 4.3 meq/L (ref 3.7–5.3)
Potassium: 3.5 mEq/L — ABNORMAL LOW (ref 3.7–5.3)
SODIUM: 132 meq/L — AB (ref 137–147)
SODIUM: 137 meq/L (ref 137–147)
Sodium: 135 mEq/L — ABNORMAL LOW (ref 137–147)

## 2014-01-04 LAB — CBG MONITORING, ED
GLUCOSE-CAPILLARY: 241 mg/dL — AB (ref 70–99)
GLUCOSE-CAPILLARY: 258 mg/dL — AB (ref 70–99)
GLUCOSE-CAPILLARY: 274 mg/dL — AB (ref 70–99)

## 2014-01-04 LAB — I-STAT VENOUS BLOOD GAS, ED
Acid-base deficit: 1 mmol/L (ref 0.0–2.0)
Bicarbonate: 24.9 mEq/L — ABNORMAL HIGH (ref 20.0–24.0)
O2 Saturation: 47 %
PCO2 VEN: 44.9 mmHg — AB (ref 45.0–50.0)
TCO2: 26 mmol/L (ref 0–100)
pH, Ven: 7.353 — ABNORMAL HIGH (ref 7.250–7.300)
pO2, Ven: 27 mmHg — CL (ref 30.0–45.0)

## 2014-01-04 LAB — GLUCOSE, CAPILLARY
GLUCOSE-CAPILLARY: 191 mg/dL — AB (ref 70–99)
GLUCOSE-CAPILLARY: 175 mg/dL — AB (ref 70–99)
GLUCOSE-CAPILLARY: 162 mg/dL — AB (ref 70–99)
Glucose-Capillary: 220 mg/dL — ABNORMAL HIGH (ref 70–99)
Glucose-Capillary: 258 mg/dL — ABNORMAL HIGH (ref 70–99)
Glucose-Capillary: 254 mg/dL — ABNORMAL HIGH (ref 70–99)
Glucose-Capillary: 154 mg/dL — ABNORMAL HIGH (ref 70–99)
Glucose-Capillary: 170 mg/dL — ABNORMAL HIGH (ref 70–99)
Glucose-Capillary: 138 mg/dL — ABNORMAL HIGH (ref 70–99)
Glucose-Capillary: 198 mg/dL — ABNORMAL HIGH (ref 70–99)
Glucose-Capillary: 244 mg/dL — ABNORMAL HIGH (ref 70–99)

## 2014-01-04 LAB — CBC
HCT: 34.2 % — ABNORMAL LOW (ref 36.0–46.0)
HEMOGLOBIN: 11.7 g/dL — AB (ref 12.0–15.0)
MCH: 28.9 pg (ref 26.0–34.0)
MCHC: 34.2 g/dL (ref 30.0–36.0)
MCV: 84.4 fL (ref 78.0–100.0)
Platelets: 147 10*3/uL — ABNORMAL LOW (ref 150–400)
RBC: 4.05 MIL/uL (ref 3.87–5.11)
RDW: 12.7 % (ref 11.5–15.5)
WBC: 8.6 10*3/uL (ref 4.0–10.5)

## 2014-01-04 LAB — SURGICAL PCR SCREEN
MRSA, PCR: NEGATIVE
Staphylococcus aureus: POSITIVE — AB

## 2014-01-04 SURGERY — LAPAROSCOPIC CHOLECYSTECTOMY WITH INTRAOPERATIVE CHOLANGIOGRAM
Anesthesia: General | Site: Abdomen

## 2014-01-04 MED ORDER — PHENYLEPHRINE 40 MCG/ML (10ML) SYRINGE FOR IV PUSH (FOR BLOOD PRESSURE SUPPORT)
PREFILLED_SYRINGE | INTRAVENOUS | Status: AC
  Filled 2014-01-04: qty 10

## 2014-01-04 MED ORDER — INSULIN ASPART 100 UNIT/ML ~~LOC~~ SOLN
0.0000 [IU] | SUBCUTANEOUS | Status: DC
  Administered 2014-01-04: 3 [IU] via SUBCUTANEOUS
  Administered 2014-01-05: 1 [IU] via SUBCUTANEOUS
  Administered 2014-01-05 (×2): 7 [IU] via SUBCUTANEOUS
  Administered 2014-01-05 (×2): 1 [IU] via SUBCUTANEOUS

## 2014-01-04 MED ORDER — BUPIVACAINE HCL 0.25 % IJ SOLN
INTRAMUSCULAR | Status: DC | PRN
  Administered 2014-01-04: 30 mL

## 2014-01-04 MED ORDER — NEOSTIGMINE METHYLSULFATE 10 MG/10ML IV SOLN
INTRAVENOUS | Status: AC
  Filled 2014-01-04: qty 1

## 2014-01-04 MED ORDER — ROCURONIUM BROMIDE 100 MG/10ML IV SOLN
INTRAVENOUS | Status: DC | PRN
  Administered 2014-01-04 (×2): 10 mg via INTRAVENOUS
  Administered 2014-01-04: 30 mg via INTRAVENOUS
  Administered 2014-01-04: 10 mg via INTRAVENOUS

## 2014-01-04 MED ORDER — SODIUM CHLORIDE 0.9 % IV SOLN
INTRAVENOUS | Status: DC
  Administered 2014-01-04: 06:00:00 via INTRAVENOUS

## 2014-01-04 MED ORDER — POTASSIUM CHLORIDE 10 MEQ/100ML IV SOLN
10.0000 meq | INTRAVENOUS | Status: DC
  Administered 2014-01-04 (×2): 10 meq via INTRAVENOUS
  Filled 2014-01-04 (×2): qty 100

## 2014-01-04 MED ORDER — ONDANSETRON HCL 4 MG/2ML IJ SOLN
INTRAMUSCULAR | Status: DC | PRN
  Administered 2014-01-04: 4 mg via INTRAVENOUS

## 2014-01-04 MED ORDER — ENOXAPARIN SODIUM 40 MG/0.4ML ~~LOC~~ SOLN
40.0000 mg | SUBCUTANEOUS | Status: DC
  Administered 2014-01-05: 40 mg via SUBCUTANEOUS
  Filled 2014-01-04 (×2): qty 0.4

## 2014-01-04 MED ORDER — DIPHENHYDRAMINE HCL 50 MG/ML IJ SOLN
INTRAMUSCULAR | Status: AC
  Filled 2014-01-04: qty 1

## 2014-01-04 MED ORDER — GLYCOPYRROLATE 0.2 MG/ML IJ SOLN
INTRAMUSCULAR | Status: AC
  Filled 2014-01-04: qty 2

## 2014-01-04 MED ORDER — INSULIN REGULAR HUMAN 100 UNIT/ML IJ SOLN
INTRAMUSCULAR | Status: DC
  Administered 2014-01-04: 06:00:00 via INTRAVENOUS
  Administered 2014-01-04: 4 [IU]/h via INTRAVENOUS
  Filled 2014-01-04 (×2): qty 1

## 2014-01-04 MED ORDER — IOHEXOL 300 MG/ML  SOLN
100.0000 mL | Freq: Once | INTRAMUSCULAR | Status: AC | PRN
  Administered 2014-01-04: 100 mL via INTRAVENOUS

## 2014-01-04 MED ORDER — NEOSTIGMINE METHYLSULFATE 10 MG/10ML IV SOLN
INTRAVENOUS | Status: DC | PRN
  Administered 2014-01-04: 3 mg via INTRAVENOUS

## 2014-01-04 MED ORDER — SODIUM CHLORIDE 0.9 % IV SOLN
INTRAVENOUS | Status: DC | PRN
  Administered 2014-01-04: 09:00:00 via INTRAVENOUS

## 2014-01-04 MED ORDER — PIPERACILLIN-TAZOBACTAM 3.375 G IVPB 30 MIN
3.3750 g | Freq: Once | INTRAVENOUS | Status: DC
  Filled 2014-01-04: qty 50

## 2014-01-04 MED ORDER — ONDANSETRON HCL 4 MG/2ML IJ SOLN: 4.0000 mg | Freq: Once | INTRAMUSCULAR | Status: DC | PRN

## 2014-01-04 MED ORDER — FENTANYL CITRATE 0.05 MG/ML IJ SOLN
INTRAMUSCULAR | Status: DC | PRN
  Administered 2014-01-04: 100 ug via INTRAVENOUS
  Administered 2014-01-04 (×3): 50 ug via INTRAVENOUS

## 2014-01-04 MED ORDER — GLYCOPYRROLATE 0.2 MG/ML IJ SOLN
INTRAMUSCULAR | Status: DC | PRN
  Administered 2014-01-04: 0.4 mg via INTRAVENOUS

## 2014-01-04 MED ORDER — METOCLOPRAMIDE HCL 5 MG/ML IJ SOLN
INTRAMUSCULAR | Status: DC | PRN
  Administered 2014-01-04: 10 mg via INTRAVENOUS

## 2014-01-04 MED ORDER — INSULIN REGULAR BOLUS VIA INFUSION
0.0000 [IU] | Freq: Three times a day (TID) | INTRAVENOUS | Status: DC
  Administered 2014-01-04: 6.1 [IU] via INTRAVENOUS
  Filled 2014-01-04: qty 10

## 2014-01-04 MED ORDER — SODIUM CHLORIDE 0.9 % IV SOLN
INTRAVENOUS | Status: DC
  Administered 2014-01-04: 1.8 [IU]/h via INTRAVENOUS
  Filled 2014-01-04: qty 1

## 2014-01-04 MED ORDER — PROPOFOL 10 MG/ML IV BOLUS
INTRAVENOUS | Status: AC
  Filled 2014-01-04: qty 20

## 2014-01-04 MED ORDER — ENOXAPARIN SODIUM 40 MG/0.4ML ~~LOC~~ SOLN: 40.0000 mg | SUBCUTANEOUS | Status: DC

## 2014-01-04 MED ORDER — SODIUM CHLORIDE 0.9 % IV SOLN: 1000.0000 mL | INTRAVENOUS | Status: DC

## 2014-01-04 MED ORDER — SODIUM CHLORIDE 0.45 % IV SOLN: INTRAVENOUS | Status: DC

## 2014-01-04 MED ORDER — PROPOFOL 10 MG/ML IV BOLUS
INTRAVENOUS | Status: DC | PRN
  Administered 2014-01-04: 160 mg via INTRAVENOUS

## 2014-01-04 MED ORDER — SODIUM CHLORIDE 0.9 % IR SOLN
Status: DC | PRN
  Administered 2014-01-04: 1000 mL

## 2014-01-04 MED ORDER — MORPHINE SULFATE 2 MG/ML IJ SOLN
2.0000 mg | INTRAMUSCULAR | Status: DC | PRN
  Administered 2014-01-04 – 2014-01-05 (×7): 2 mg via INTRAVENOUS
  Filled 2014-01-04 (×6): qty 1

## 2014-01-04 MED ORDER — 0.9 % SODIUM CHLORIDE (POUR BTL) OPTIME
TOPICAL | Status: DC | PRN
  Administered 2014-01-04: 1000 mL

## 2014-01-04 MED ORDER — MORPHINE SULFATE 2 MG/ML IJ SOLN
INTRAMUSCULAR | Status: AC
  Filled 2014-01-04: qty 1

## 2014-01-04 MED ORDER — LIDOCAINE HCL (CARDIAC) 20 MG/ML IV SOLN
INTRAVENOUS | Status: DC | PRN
  Administered 2014-01-04: 200 mg via INTRAVENOUS

## 2014-01-04 MED ORDER — POTASSIUM CHLORIDE 10 MEQ/100ML IV SOLN
INTRAVENOUS | Status: DC | PRN
  Administered 2014-01-04 (×3): 10 meq via INTRAVENOUS

## 2014-01-04 MED ORDER — DEXTROSE-NACL 5-0.45 % IV SOLN
INTRAVENOUS | Status: DC
  Administered 2014-01-04: 125 mL via INTRAVENOUS

## 2014-01-04 MED ORDER — ONDANSETRON HCL 4 MG/2ML IJ SOLN
INTRAMUSCULAR | Status: AC
  Filled 2014-01-04: qty 2

## 2014-01-04 MED ORDER — DEXTROSE 50 % IV SOLN: 25.0000 mL | INTRAVENOUS | Status: DC | PRN

## 2014-01-04 MED ORDER — SODIUM CHLORIDE 0.9 % IV SOLN
10.0000 mg | INTRAVENOUS | Status: DC | PRN
  Administered 2014-01-04: 20 ug/min via INTRAVENOUS

## 2014-01-04 MED ORDER — DIPHENHYDRAMINE HCL 50 MG/ML IJ SOLN
INTRAMUSCULAR | Status: DC | PRN
  Administered 2014-01-04: 25 mg via INTRAVENOUS

## 2014-01-04 MED ORDER — SODIUM CHLORIDE 0.9 % IV SOLN: INTRAVENOUS | Status: DC

## 2014-01-04 MED ORDER — SODIUM CHLORIDE 0.9 % IV SOLN
INTRAVENOUS | Status: DC | PRN
  Administered 2014-01-04 (×2): via INTRAVENOUS

## 2014-01-04 MED ORDER — ARTIFICIAL TEARS OP OINT
TOPICAL_OINTMENT | OPHTHALMIC | Status: DC | PRN
  Administered 2014-01-04: 1 via OPHTHALMIC

## 2014-01-04 MED ORDER — METOCLOPRAMIDE HCL 5 MG/ML IJ SOLN
INTRAMUSCULAR | Status: AC
  Filled 2014-01-04: qty 2

## 2014-01-04 MED ORDER — PHENYLEPHRINE HCL 10 MG/ML IJ SOLN
INTRAMUSCULAR | Status: DC | PRN
  Administered 2014-01-04 (×4): 120 ug via INTRAVENOUS

## 2014-01-04 MED ORDER — IOHEXOL 300 MG/ML  SOLN
INTRAMUSCULAR | Status: DC | PRN
  Administered 2014-01-04: 50 mL

## 2014-01-04 MED ORDER — BUPIVACAINE-EPINEPHRINE (PF) 0.25% -1:200000 IJ SOLN
INTRAMUSCULAR | Status: AC
  Filled 2014-01-04: qty 30

## 2014-01-04 MED ORDER — DEXTROSE-NACL 5-0.45 % IV SOLN
INTRAVENOUS | Status: DC
  Administered 2014-01-04: 06:00:00 via INTRAVENOUS

## 2014-01-04 MED ORDER — ROCURONIUM BROMIDE 50 MG/5ML IV SOLN
INTRAVENOUS | Status: AC
  Filled 2014-01-04: qty 1

## 2014-01-04 MED ORDER — KCL IN DEXTROSE-NACL 20-5-0.9 MEQ/L-%-% IV SOLN
INTRAVENOUS | Status: DC
  Administered 2014-01-04: 13:00:00 via INTRAVENOUS
  Filled 2014-01-04 (×7): qty 1000

## 2014-01-04 MED ORDER — FENTANYL CITRATE 0.05 MG/ML IJ SOLN
INTRAMUSCULAR | Status: AC
  Filled 2014-01-04: qty 5

## 2014-01-04 MED ORDER — PANTOPRAZOLE SODIUM 40 MG IV SOLR
40.0000 mg | Freq: Every day | INTRAVENOUS | Status: DC
  Administered 2014-01-04: 40 mg via INTRAVENOUS
  Filled 2014-01-04 (×2): qty 40

## 2014-01-04 MED ORDER — PNEUMOCOCCAL VAC POLYVALENT 25 MCG/0.5ML IJ INJ
0.5000 mL | INJECTION | INTRAMUSCULAR | Status: DC
  Filled 2014-01-04: qty 0.5

## 2014-01-04 MED ORDER — PIPERACILLIN-TAZOBACTAM 3.375 G IVPB
3.3750 g | Freq: Three times a day (TID) | INTRAVENOUS | Status: DC
  Administered 2014-01-05 (×2): 3.375 g via INTRAVENOUS
  Filled 2014-01-04 (×4): qty 50

## 2014-01-04 MED ORDER — PHENYLEPHRINE 40 MCG/ML (10ML) SYRINGE FOR IV PUSH (FOR BLOOD PRESSURE SUPPORT)
PREFILLED_SYRINGE | INTRAVENOUS | Status: AC
  Filled 2014-01-04: qty 20

## 2014-01-04 MED ORDER — SUCCINYLCHOLINE CHLORIDE 20 MG/ML IJ SOLN
INTRAMUSCULAR | Status: AC
  Filled 2014-01-04: qty 1

## 2014-01-04 MED ORDER — HYDROMORPHONE HCL PF 1 MG/ML IJ SOLN
0.2500 mg | INTRAMUSCULAR | Status: DC | PRN
  Administered 2014-01-04 (×2): 0.5 mg via INTRAVENOUS

## 2014-01-04 MED ORDER — ONDANSETRON HCL 4 MG/2ML IJ SOLN: 4.0000 mg | Freq: Four times a day (QID) | INTRAMUSCULAR | Status: DC | PRN

## 2014-01-04 MED ORDER — INSULIN GLARGINE 100 UNIT/ML ~~LOC~~ SOLN
30.0000 [IU] | Freq: Once | SUBCUTANEOUS | Status: AC
  Administered 2014-01-04: 30 [IU] via SUBCUTANEOUS
  Filled 2014-01-04: qty 0.3

## 2014-01-04 MED ORDER — ARTIFICIAL TEARS OP OINT
TOPICAL_OINTMENT | OPHTHALMIC | Status: AC
  Filled 2014-01-04: qty 3.5

## 2014-01-04 MED ORDER — PIPERACILLIN-TAZOBACTAM 3.375 G IVPB
3.3750 g | Freq: Three times a day (TID) | INTRAVENOUS | Status: DC
  Administered 2014-01-04 (×2): 3.375 g via INTRAVENOUS
  Filled 2014-01-04 (×4): qty 50

## 2014-01-04 MED ORDER — LIDOCAINE HCL (CARDIAC) 20 MG/ML IV SOLN
INTRAVENOUS | Status: AC
  Filled 2014-01-04: qty 10

## 2014-01-04 MED ORDER — HYDROMORPHONE HCL PF 1 MG/ML IJ SOLN
INTRAMUSCULAR | Status: AC
  Filled 2014-01-04: qty 1

## 2014-01-04 SURGICAL SUPPLY — 54 items
APL SKNCLS STERI-STRIP NONHPOA (GAUZE/BANDAGES/DRESSINGS) ×1
BAG SPEC RTRVL LRG 6X4 10 (ENDOMECHANICALS) ×1
BENZOIN TINCTURE PRP APPL 2/3 (GAUZE/BANDAGES/DRESSINGS) ×3 IMPLANT
CANISTER SUCTION 2500CC (MISCELLANEOUS) ×3 IMPLANT
CHLORAPREP W/TINT 26ML (MISCELLANEOUS) ×3 IMPLANT
CLIP LIGATING HEMO O LOK GREEN (MISCELLANEOUS) ×5 IMPLANT
CLOSURE STERI-STRIP 1/2X4 (GAUZE/BANDAGES/DRESSINGS) ×1
CLSR STERI-STRIP ANTIMIC 1/2X4 (GAUZE/BANDAGES/DRESSINGS) ×1 IMPLANT
COVER MAYO STAND STRL (DRAPES) ×3 IMPLANT
COVER SURGICAL LIGHT HANDLE (MISCELLANEOUS) ×3 IMPLANT
COVER TRANSDUCER ULTRASND (DRAPES) ×2 IMPLANT
DEVICE TROCAR PUNCTURE CLOSURE (ENDOMECHANICALS) ×3 IMPLANT
DRAPE C-ARM 42X72 X-RAY (DRAPES) ×3 IMPLANT
DRAPE UTILITY 15X26 W/TAPE STR (DRAPE) ×6 IMPLANT
ELECT REM PT RETURN 9FT ADLT (ELECTROSURGICAL) ×3
ELECTRODE REM PT RTRN 9FT ADLT (ELECTROSURGICAL) ×1 IMPLANT
GAUZE SPONGE 2X2 8PLY STRL LF (GAUZE/BANDAGES/DRESSINGS) ×1 IMPLANT
GLOVE BIO SURGEON STRL SZ 6.5 (GLOVE) ×1 IMPLANT
GLOVE BIO SURGEON STRL SZ7 (GLOVE) ×2 IMPLANT
GLOVE BIO SURGEON STRL SZ7.5 (GLOVE) ×5 IMPLANT
GLOVE BIO SURGEONS STRL SZ 6.5 (GLOVE) ×1
GLOVE BIOGEL PI IND STRL 7.0 (GLOVE) IMPLANT
GLOVE BIOGEL PI IND STRL 7.5 (GLOVE) IMPLANT
GLOVE BIOGEL PI INDICATOR 7.0 (GLOVE) ×4
GLOVE BIOGEL PI INDICATOR 7.5 (GLOVE) ×4
GLOVE ECLIPSE 7.5 STRL STRAW (GLOVE) ×2 IMPLANT
GLOVE SS BIOGEL STRL SZ 6.5 (GLOVE) IMPLANT
GLOVE SUPERSENSE BIOGEL SZ 6.5 (GLOVE) ×2
GOWN STRL REUS W/ TWL LRG LVL3 (GOWN DISPOSABLE) ×3 IMPLANT
GOWN STRL REUS W/ TWL XL LVL3 (GOWN DISPOSABLE) ×1 IMPLANT
GOWN STRL REUS W/TWL LRG LVL3 (GOWN DISPOSABLE) ×9
GOWN STRL REUS W/TWL XL LVL3 (GOWN DISPOSABLE) ×3
IV CATH 14GX2 1/4 (CATHETERS) ×3 IMPLANT
KIT BASIN OR (CUSTOM PROCEDURE TRAY) ×3 IMPLANT
KIT ROOM TURNOVER OR (KITS) ×3 IMPLANT
NDL INSUFFLATION 14GA 120MM (NEEDLE) ×1 IMPLANT
NEEDLE INSUFFLATION 14GA 120MM (NEEDLE) ×3 IMPLANT
NS IRRIG 1000ML POUR BTL (IV SOLUTION) ×3 IMPLANT
PAD ARMBOARD 7.5X6 YLW CONV (MISCELLANEOUS) ×6 IMPLANT
POUCH SPECIMEN RETRIEVAL 10MM (ENDOMECHANICALS) ×2 IMPLANT
SCISSORS LAP 5X35 DISP (ENDOMECHANICALS) ×3 IMPLANT
SET CHOLANGIOGRAPHY FRANKLIN (SET/KITS/TRAYS/PACK) ×3 IMPLANT
SET IRRIG TUBING LAPAROSCOPIC (IRRIGATION / IRRIGATOR) ×3 IMPLANT
SLEEVE ENDOPATH XCEL 5M (ENDOMECHANICALS) ×3 IMPLANT
SPECIMEN JAR SMALL (MISCELLANEOUS) ×3 IMPLANT
SPONGE GAUZE 2X2 STER 10/PKG (GAUZE/BANDAGES/DRESSINGS) ×2
SUT MNCRL AB 3-0 PS2 18 (SUTURE) ×7 IMPLANT
SUT MON AB 5-0 PS2 18 (SUTURE) ×2 IMPLANT
SUT VICRYL 0 UR6 27IN ABS (SUTURE) ×6 IMPLANT
TAPE CLOTH SOFT 2X10 (GAUZE/BANDAGES/DRESSINGS) ×2 IMPLANT
TOWEL OR 17X26 10 PK STRL BLUE (TOWEL DISPOSABLE) ×3 IMPLANT
TRAY LAPAROSCOPIC (CUSTOM PROCEDURE TRAY) ×3 IMPLANT
TROCAR XCEL NON-BLD 11X100MML (ENDOMECHANICALS) ×3 IMPLANT
TROCAR XCEL NON-BLD 5MMX100MML (ENDOMECHANICALS) ×3 IMPLANT

## 2014-01-04 NOTE — Consult Note (Addendum)
[facility] Medical Consultation  Vilinda BoehringerRosemary Savarino AOZ:[id number]:[id number] DOB: 11/11/1955 DOA: 01/03/2014 PCP: PROVIDER NOT IN SYSTEM   Requesting physician: Dr. Dwain SarnaWakefield Date of consultation: 01/04/14 Reason for consultation: DM with hyperglycemia  Impression/Recommendations Principal Problem:   Cholecystitis, acute with cholelithiasis Active Problems:   Hyperglycemia   Increased anion gap metabolic acidosis   Diabetes mellitus type 2 in obese   Hypokalemia    1. Cholecystitis, acute with cholelithiasis - lap chole later today, surgery is primary on patient. 2. Increased anion gap metabolic acidosis - resolved with insulin gtt and IVF, patient likely had mild DKA now resolved with treatment. 3. Hyperglycemia in setting of DM2 - plan to continue patient on insulin gtt for the moment on floor and during surgery (spoke with Dr. Dwain SarnaWakefield and apparently they do insulin gtt's intraoperatively on a routine basis as well), as this is likely the best strategy to keep a controlled BGL in the acute setting on this patient who a) has acute illness (and likely increased insulin needs), and b) will be NPO for the next few hours.  Once patient is out of surgery and ready to resume PO diet then will see about switching her back to an [place] regimen (takes lantus and metformin only at home). 4. Hypokalemia - mild with potassium 3.5 but likely to worsen with ongoing treatment using insulin, have ordered 4 runs of 10meq IV kcl to replace potassium, recheck BMP ordered for noon.  I will followup again tomorrow. Please contact me if I can be of assistance in the meanwhile. Thank you for this consultation.  Chief Complaint: Abdominal pain  HPI:  58 yo F with PMH of DM2, presents to the ED with abdominal pain, located in her RUQ, onset Saturday and persistent since then with progressive worsening now radiating to her back.  Because this wasn't improving at home the patient came to the ER for further evaluation.  She  does not report N/V with this but hasnt been eating much she states.  Work up in the ER reveals acute cholecystitis clinically and radiologically.  After a discussion with general surgery the plan is to perform Lap cholecystectomy later today.  During the course of her workup in the ED, she is also noted to be very hyperglycemic with CBGs in the 400s and have a mild anion gap metabolic acidosis with initial anion gap of 19.  She was put on an insulin gtt and IVF started.  Fortunately by the time medicine has been consulted, her CBG has already improved to 259 and her anion gap is already closed on repeat BMP.  Review of Systems:  12 systems reviewed and otherwise negative.  Past Medical History  Diagnosis Date  . Diabetes mellitus   . Hypertension   . Gout   . Hyperlipidemia    Past Surgical History  Procedure Laterality Date  . Lung surgery     Social History:  reports that she has never smoked. She does not have any smokeless tobacco history on file. She reports that she does not drink alcohol or use illicit drugs.  No Known Allergies Family History  Problem Relation Age of Onset  . Diabetes Sister   . High blood pressure Neg Hx   . High Cholesterol Neg Hx   . Heart attack Neg Hx     Prior to Admission medications   Medication Sig Start Date End Date Taking? Authorizing Provider  insulin glargine (LANTUS) 100 UNIT/ML injection Inject 60 Units into the skin at bedtime.  Yes Historical Provider, MD  metFORMIN (GLUCOPHAGE) 1000 MG tablet Take 1,000 mg by mouth 2 (two) times daily with a meal.     Yes Historical Provider, MD  niacin-simvastatin (SIMCOR) 500-20 MG 24 hr tablet Take 1 tablet by mouth at bedtime.   Yes Historical Provider, MD   Physical Exam: Blood pressure 113/60, pulse 90, temperature 97.6 F (36.4 C), temperature source Oral, resp. rate 17, height 5\' 3"  (1.6 m), weight 72.576 kg (160 lb), SpO2 96.00%. Filed Vitals:   01/04/14 0400  BP: 113/60  Pulse: 90   Temp:   Resp: 17    General:  NAD, resting comfortably in bed Eyes: PEERLA EOMI ENT: mucous membranes moist Neck: supple w/o JVD Cardiovascular: RRR w/o MRG Respiratory: CTA B Abdomen: soft, nt, nd, bs+ Skin: no rash nor lesion Musculoskeletal: MAE, full ROM all 4 extremities Psychiatric: normal tone and affect Neurologic: AAOx3, grossly non-focal  Labs on Admission:  Basic Metabolic Panel:  Recent Labs Lab 01/03/14 1930 01/04/14 0127  NA 130* 132*  K 4.0 3.5*  CL 88* 96  CO2 23 23  GLUCOSE 397* 259*  BUN 13 10  CREATININE 0.46* 0.40*  CALCIUM 9.6 8.2*   Liver Function Tests:  Recent Labs Lab 01/03/14 1930  AST 9  ALT 10  ALKPHOS 77  BILITOT 0.5  PROT 7.7  ALBUMIN 3.7    Recent Labs Lab 01/03/14 1930  LIPASE 28   No results found for this basename: AMMONIA,  in the last 168 hours CBC:  Recent Labs Lab 01/03/14 1930  WBC 11.3*  NEUTROABS 7.8*  HGB 13.4  HCT 38.6  MCV 82.5  PLT 198   Cardiac Enzymes: No results found for this basename: CKTOTAL, CKMB, CKMBINDEX, TROPONINI,  in the last 168 hours BNP: No components found with this basename: POCBNP,  CBG:  Recent Labs Lab 01/03/14 2313 01/04/14 0120 01/04/14 0329  GLUCAP 344* 241* 258*    Radiological Exams on Admission: Ct Abdomen Pelvis W Contrast  01/04/2014   CLINICAL DATA:  Diffuse abdominal pain radiating to back. Evaluate for diverticulitis.  EXAM: CT ABDOMEN AND PELVIS WITH CONTRAST  TECHNIQUE: Multidetector CT imaging of the abdomen and pelvis was performed using the standard protocol following bolus administration of intravenous contrast.  CONTRAST:  OMNIPAQUE IOHEXOL 300 MG/ML  SOLN  COMPARISON:  CT of the abdomen and pelvis February 15, 2013  FINDINGS: LUNG BASES: Status post right lower lobe apparent wedge resection. Minimal left lung base pleural thickening without pleural effusions or focal consolidations there are coronary artery calcifications partly imaged. Heart size  is normal, small, slightly decreased pericardial effusion.  SOLID ORGANS: The spleen, pancreas and adrenal glands are unremarkable. Gallbladder distended, with gallbladder wall thickening, pericholecystic fluid and inflammatory changes. No CT findings of cholelithiasis. The liver is mildly enlarged, diffusely hypodense most consistent with hepatic steatosis.  GASTROINTESTINAL TRACT: The stomach, small and large bowel are normal in course and caliber without inflammatory changes. Enteric contrast has not yet reached the distal small bowel. Normal appendix.  KIDNEYS/ URINARY TRACT: Kidneys are orthotopic, demonstrating symmetric enhancement. No nephrolithiasis, hydronephrosis or renal masses. Minimal cortical scarring of the right kidney. The unopacified ureters are normal in course and caliber. Delayed imaging through the kidneys demonstrates symmetric prompt excretion to the proximal urinary collecting system. Urinary bladder is partially distended and unremarkable.  PERITONEUM/RETROPERITONEUM: No intraperitoneal free air. Aortoiliac vessels are normal in course and caliber, minimal calcific atherosclerosis. No lymphadenopathy by CT size criteria. Internal reproductive organs are  unremarkable. Stable 2.6 cm calcified fatty mass in pelvis may reflect remote omental infarct or epiploic appendagitis.  SOFT TISSUE/OSSEOUS STRUCTURES: Nonsuspicious. Severe L5-S1 degenerative disc disease resulting in severe right L5-S1 neural foraminal narrowing.  IMPRESSION: Acute cholecystitis.  No CT findings of cholelithiasis.  Right renal cortical scarring (which may be secondary to patient's prior pyelonephritis).  Stable mild hepatomegaly and fatty liver.  Findings discussed with and reconfirmed by Dr.PETER DAMMEN on7/22/2015at12:50 am.   Electronically Signed   By: Awilda Metro   On: 01/04/2014 00:51   US Abdomen Limited Ruq  01/04/2014   CLINICAL DATA:  Right upper quadrant abdominal pain.  EXAM: US ABDOMEN LIMITED -  RIGHT UPPER QUADRANT  COMPARISON:  CT of the abdomen and pelvis performed earlier today at 12:31 a.m.  FINDINGS: Gallbladder:  Distended, with numerous layering stones seen dependently in the gallbladder, measuring up to 1.3 cm in size. Underlying echogenic sludge is noted. Significant gallbladder wall thickening is seen, with pericholecystic fluid. A positive ultrasonographic Murphy's sign is elicited.  Common bile duct:  Diameter: 0.4 cm, within normal limits in caliber.  Liver:  A 2.8 x 2.5 x 2.1 cm focus of decreased echogenicity adjacent to the gallbladder is thought to reflect focal fatty sparing. Diffuse fatty infiltration is noted within the liver, with diffusely increased echogenicity and coarsened echotexture.  IMPRESSION: 1. Acute cholecystitis noted, with cholelithiasis, gallbladder distention, significant gallbladder wall thickening and pericholecystic fluid. Positive ultrasonographic Murphy's sign elicited. The common hepatic duct remains normal in caliber. 2. Diffuse fatty infiltration within the liver. 2.8 cm focus of decreased echogenicity adjacent to the gallbladder is thought to reflect focal fatty sparing.   Electronically Signed   By: Roanna Raider M.D.   On: 01/04/2014 02:21    EKG: Independently reviewed.   Time spent: 60 min  Lil Lepage M. [facility] Pager [phone]  If 7PM-7AM, please contact night-coverage [email or web address] Password [facility] 01/04/2014, 4:35 AM

## 2014-01-04 NOTE — Anesthesia Preprocedure Evaluation (Signed)
Anesthesia Evaluation  Patient identified by MRN, date of birth, ID band Patient awake    Reviewed: Allergy & Precautions, H&P , NPO status , Patient's Chart, lab work & pertinent test results  Airway       Dental   Pulmonary          Cardiovascular hypertension, + CAD     Neuro/Psych    GI/Hepatic   Endo/Other  diabetes, Type 1, Insulin Dependent, Oral Hypoglycemic Agents  Renal/GU Renal InsufficiencyRenal disease     Musculoskeletal   Abdominal   Peds  Hematology   Anesthesia Other Findings   Reproductive/Obstetrics                           Anesthesia Physical Anesthesia Plan  ASA: III  Anesthesia Plan: General   Post-op Pain Management:    Induction: Intravenous  Airway Management Planned: Oral ETT  Additional Equipment:   Intra-op Plan:   Post-operative Plan: Extubation in OR  Informed Consent: I have reviewed the patients History and Physical, chart, labs and discussed the procedure including the risks, benefits and alternatives for the proposed anesthesia with the patient or authorized representative who has indicated his/her understanding and acceptance.     Plan Discussed with: CRNA, Anesthesiologist and Surgeon  Anesthesia Plan Comments:         Anesthesia Quick Evaluation

## 2014-01-04 NOTE — H&P (Signed)
Emily Farmer is an 58 y.o. female.   Chief Complaint: ruq pain HPI: 25 yof with history significant for diabetes who presents with ruq pain since Saturday that has never occurred before. This has progressively worsened and began radiating to her back. This was getting no better at home leading her to come to er. No n/v.  No changes in bms. Not eating much.  Past Medical History  Diagnosis Date  . Diabetes mellitus   . Hypertension   . Gout   . Hyperlipidemia     Past Surgical History  Procedure Laterality Date  . Lung surgery      Family History  Problem Relation Age of Onset  . Diabetes Sister   . High blood pressure Neg Hx   . High Cholesterol Neg Hx   . Heart attack Neg Hx    Social History:  reports that she has never smoked. She does not have any smokeless tobacco history on file. She reports that she does not drink alcohol or use illicit drugs.  Allergies: No Known Allergies  Meds: insulin, metformin, simcor  Results for orders placed during the hospital encounter of 01/03/14 (from the past 48 hour(s))  CBC WITH DIFFERENTIAL     Status: Abnormal   Collection Time    01/03/14  7:30 PM      Result Value Ref Range   WBC 11.3 (*) 4.0 - 10.5 K/uL   RBC 4.68  3.87 - 5.11 MIL/uL   Hemoglobin 13.4  12.0 - 15.0 g/dL   HCT 38.6  36.0 - 46.0 %   MCV 82.5  78.0 - 100.0 fL   MCH 28.6  26.0 - 34.0 pg   MCHC 34.7  30.0 - 36.0 g/dL   RDW 12.8  11.5 - 15.5 %   Platelets 198  150 - 400 K/uL   Neutrophils Relative % 69  43 - 77 %   Neutro Abs 7.8 (*) 1.7 - 7.7 K/uL   Lymphocytes Relative 22  12 - 46 %   Lymphs Abs 2.5  0.7 - 4.0 K/uL   Monocytes Relative 8  3 - 12 %   Monocytes Absolute 0.9  0.1 - 1.0 K/uL   Eosinophils Relative 1  0 - 5 %   Eosinophils Absolute 0.1  0.0 - 0.7 K/uL   Basophils Relative 0  0 - 1 %   Basophils Absolute 0.0  0.0 - 0.1 K/uL  COMPREHENSIVE METABOLIC PANEL     Status: Abnormal   Collection Time    01/03/14  7:30 PM      Result Value Ref Range    Sodium 130 (*) 137 - 147 mEq/L   Potassium 4.0  3.7 - 5.3 mEq/L   Chloride 88 (*) 96 - 112 mEq/L   CO2 23  19 - 32 mEq/L   Glucose, Bld 397 (*) 70 - 99 mg/dL   BUN 13  6 - 23 mg/dL   Creatinine, Ser 0.46 (*) 0.50 - 1.10 mg/dL   Calcium 9.6  8.4 - 10.5 mg/dL   Total Protein 7.7  6.0 - 8.3 g/dL   Albumin 3.7  3.5 - 5.2 g/dL   AST 9  0 - 37 U/L   ALT 10  0 - 35 U/L   Alkaline Phosphatase 77  39 - 117 U/L   Total Bilirubin 0.5  0.3 - 1.2 mg/dL   GFR calc non Af Amer >90  >90 mL/min   GFR calc Af Amer >90  >90 mL/min  Comment: (NOTE)     The eGFR has been calculated using the CKD EPI equation.     This calculation has not been validated in all clinical situations.     eGFR's persistently <90 mL/min signify possible Chronic Kidney     Disease.   Anion gap 19 (*) 5 - 15  LIPASE, BLOOD     Status: None   Collection Time    01/03/14  7:30 PM      Result Value Ref Range   Lipase 28  11 - 59 U/L  URINALYSIS, ROUTINE W REFLEX MICROSCOPIC     Status: Abnormal   Collection Time    01/03/14  8:01 PM      Result Value Ref Range   Color, Urine YELLOW  YELLOW   APPearance CLEAR  CLEAR   Specific Gravity, Urine 1.010  1.005 - 1.030   pH 5.0  5.0 - 8.0   Glucose, UA >1000 (*) NEGATIVE mg/dL   Hgb urine dipstick NEGATIVE  NEGATIVE   Bilirubin Urine NEGATIVE  NEGATIVE   Ketones, ur 15 (*) NEGATIVE mg/dL   Protein, ur NEGATIVE  NEGATIVE mg/dL   Urobilinogen, UA 0.2  0.0 - 1.0 mg/dL   Nitrite NEGATIVE  NEGATIVE   Leukocytes, UA TRACE (*) NEGATIVE  URINE MICROSCOPIC-ADD ON     Status: Abnormal   Collection Time    01/03/14  8:01 PM      Result Value Ref Range   Squamous Epithelial / LPF FEW (*) RARE   WBC, UA 3-6  <3 WBC/hpf   RBC / HPF 0-2  <3 RBC/hpf   Bacteria, UA RARE  RARE   Urine-Other RARE YEAST    CBG MONITORING, ED     Status: Abnormal   Collection Time    01/03/14 11:13 PM      Result Value Ref Range   Glucose-Capillary 344 (*) 70 - 99 mg/dL  CBG MONITORING, ED      Status: Abnormal   Collection Time    01/04/14  1:20 AM      Result Value Ref Range   Glucose-Capillary 241 (*) 70 - 99 mg/dL  BASIC METABOLIC PANEL     Status: Abnormal   Collection Time    01/04/14  1:27 AM      Result Value Ref Range   Sodium 132 (*) 137 - 147 mEq/L   Potassium 3.5 (*) 3.7 - 5.3 mEq/L   Chloride 96  96 - 112 mEq/L   Comment: DELTA CHECK NOTED   CO2 23  19 - 32 mEq/L   Glucose, Bld 259 (*) 70 - 99 mg/dL   BUN 10  6 - 23 mg/dL   Creatinine, Ser 0.40 (*) 0.50 - 1.10 mg/dL   Calcium 8.2 (*) 8.4 - 10.5 mg/dL   GFR calc non Af Amer >90  >90 mL/min   GFR calc Af Amer >90  >90 mL/min   Comment: (NOTE)     The eGFR has been calculated using the CKD EPI equation.     This calculation has not been validated in all clinical situations.     eGFR's persistently <90 mL/min signify possible Chronic Kidney     Disease.   Anion gap 13  5 - 15  I-STAT VENOUS BLOOD GAS, ED     Status: Abnormal   Collection Time    01/04/14  1:35 AM      Result Value Ref Range   pH, Ven 7.353 (*) 7.250 - 7.300   pCO2,  Ven 44.9 (*) 45.0 - 50.0 mmHg   pO2, Ven 27.0 (*) 30.0 - 45.0 mmHg   Bicarbonate 24.9 (*) 20.0 - 24.0 mEq/L   TCO2 26  0 - 100 mmol/L   O2 Saturation 47.0     Acid-base deficit 1.0  0.0 - 2.0 mmol/L   Sample type VENOUS     Comment NOTIFIED PHYSICIAN    CBG MONITORING, ED     Status: Abnormal   Collection Time    01/04/14  3:29 AM      Result Value Ref Range   Glucose-Capillary 258 (*) 70 - 99 mg/dL   Comment 1 Repeat Test     Ct Abdomen Pelvis W Contrast  01/04/2014   CLINICAL DATA:  Diffuse abdominal pain radiating to back. Evaluate for diverticulitis.  EXAM: CT ABDOMEN AND PELVIS WITH CONTRAST  TECHNIQUE: Multidetector CT imaging of the abdomen and pelvis was performed using the standard protocol following bolus administration of intravenous contrast.  CONTRAST:  175m OMNIPAQUE IOHEXOL 300 MG/ML  SOLN  COMPARISON:  CT of the abdomen and pelvis February 15, 2013   FINDINGS: LUNG BASES: Status post right lower lobe apparent wedge resection. Minimal left lung base pleural thickening without pleural effusions or focal consolidations there are coronary artery calcifications partly imaged. Heart size is normal, small, slightly decreased pericardial effusion.  SOLID ORGANS: The spleen, pancreas and adrenal glands are unremarkable. Gallbladder distended, with gallbladder wall thickening, pericholecystic fluid and inflammatory changes. No CT findings of cholelithiasis. The liver is mildly enlarged, diffusely hypodense most consistent with hepatic steatosis.  GASTROINTESTINAL TRACT: The stomach, small and large bowel are normal in course and caliber without inflammatory changes. Enteric contrast has not yet reached the distal small bowel. Normal appendix.  KIDNEYS/ URINARY TRACT: Kidneys are orthotopic, demonstrating symmetric enhancement. No nephrolithiasis, hydronephrosis or renal masses. Minimal cortical scarring of the right kidney. The unopacified ureters are normal in course and caliber. Delayed imaging through the kidneys demonstrates symmetric prompt excretion to the proximal urinary collecting system. Urinary bladder is partially distended and unremarkable.  PERITONEUM/RETROPERITONEUM: No intraperitoneal free air. Aortoiliac vessels are normal in course and caliber, minimal calcific atherosclerosis. No lymphadenopathy by CT size criteria. Internal reproductive organs are unremarkable. Stable 2.6 cm calcified fatty mass in pelvis may reflect remote omental infarct or epiploic appendagitis.  SOFT TISSUE/OSSEOUS STRUCTURES: Nonsuspicious. Severe L5-S1 degenerative disc disease resulting in severe right L5-S1 neural foraminal narrowing.  IMPRESSION: Acute cholecystitis.  No CT findings of cholelithiasis.  Right renal cortical scarring (which may be secondary to patient's prior pyelonephritis).  Stable mild hepatomegaly and fatty liver.  Findings discussed with and reconfirmed by  Dr.PETER DAMMEN on7/22/2015at12:50 am.   Electronically Signed   By: CElon Alas  On: 01/04/2014 00:51   [place] Limited Ruq  01/04/2014   CLINICAL DATA:  Right upper quadrant abdominal pain.  EXAM: [place]DOM[place] LIMITED - RIGHT UPPER QUADRANT  COMPARISON:  CT of the abdomen and pelvis performed earlier today at 12:31 a.m.  FINDINGS: Gallbladder:  Distended, with numerous layering stones seen dependently in the gallbladder, measuring up to 1.3 cm in size. Underlying echogenic sludge is noted. Significant gallbladder wall thickening is seen, with pericholecystic fluid. A positive ultrasonographic Murphy's sign is elicited.  Common bile duct:  Diameter: 0.4 cm, within normal limits in caliber.  Liver:  A 2.8 x 2.5 x 2.1 cm focus of decreased echogenicity adjacent to the gallbladder is thought to reflect focal fatty sparing. Diffuse fatty infiltration is noted within the  liver, with diffusely increased echogenicity and coarsened echotexture.  IMPRESSION: 1. Acute cholecystitis noted, with cholelithiasis, gallbladder distention, significant gallbladder wall thickening and pericholecystic fluid. Positive ultrasonographic Murphy's sign elicited. The common hepatic duct remains normal in caliber. 2. Diffuse fatty infiltration within the liver. 2.8 cm focus of decreased echogenicity adjacent to the gallbladder is thought to reflect focal fatty sparing.   Electronically Signed   By: Garald Balding M.D.   On: 01/04/2014 02:21    Review of Systems  Constitutional: Negative for fever and chills.  Respiratory: Negative for shortness of breath.   Cardiovascular: Negative for chest pain.  Gastrointestinal: Positive for abdominal pain. Negative for nausea and vomiting.    Blood pressure 106/73, pulse 96, temperature 97.6 F (36.4 C), temperature source Oral, resp. rate 24, height 5' 3" (1.6 m), weight 160 lb (72.576 kg), SpO2 97.00%. Physical Exam  Vitals reviewed. Constitutional: She is oriented to person,  place, and time. She appears well-developed and well-nourished.  Eyes: No scleral icterus.  Neck: Neck supple.  Cardiovascular: Normal rate, regular rhythm and normal heart sounds.   Respiratory: Effort normal and breath sounds normal. She has no wheezes. She has no rales.  GI: Soft. Bowel sounds are normal. She exhibits no distension. There is tenderness in the right upper quadrant. There is positive Murphy's sign.  Lymphadenopathy:    She has no cervical adenopathy.  Neurological: She is alert and oriented to person, place, and time.     Assessment/Plan Cholecystitis Hyperglycemia  She clinically and radiologically has cholecystitis with gallstones noted on [place].  We discussed laparoscopic cholecystectomy as treatment for disease. Will admit on abx, make npo and plan surgery later today.   I have asked hospitalists to see her as she is on insulin drip in the er and initial bmet with elevated ag.  Last bmet looks better with glucose better but will have them assist in management.  , 01/04/2014, 3:59 AM

## 2014-01-04 NOTE — Transfer of Care (Signed)
Immediate Anesthesia Transfer of Care Note  Patient: Emily Farmer  Procedure(s) Performed: Procedure(s): LAPAROSCOPIC CHOLECYSTECTOMY WITH INTRAOPERATIVE CHOLANGIOGRAM (N/A)  Patient Location: PACU  Anesthesia Type:General  Level of Consciousness: awake, alert , oriented and patient cooperative  Airway & Oxygen Therapy: Patient Spontanous Breathing and Patient connected to nasal cannula oxygen  Post-op Assessment: Report given to PACU RN and Post -op Vital signs reviewed and stable  Post vital signs: Reviewed and stable  Complications: No apparent anesthesia complications

## 2014-01-04 NOTE — Progress Notes (Signed)
3rd potassium bolus started upon arrival to pacu per CRNA

## 2014-01-04 NOTE — Progress Notes (Signed)
[facility] PROGRESS NOTE  Madai Nuccio ZOX:[id number] DOB: 12-08-1955 DOA: 01/03/2014 PCP: PROVIDER NOT IN SYSTEM  Assessment/Plan: Uncontrolled Diabetes mellitus type 2/mild DKA - DKA resolved, transition to lantus and SSI Cholecystitis, acute with cholelithiasis  -S/P cholecystectomy today 7/22, per surgery  Active Problems:  Increased anion gap metabolic acidosis   -d/t #1, resolved Hypokalemia - k replaced  Code Status: full Family Communication: none at bedside  Disposition Plan: to home when medically ready     HPI/Subjective: S/p cholecystectomy, tolerating clears  Objective: Filed Vitals:   01/04/14 1715  BP: 102/55  Pulse: 83  Temp: 99.2 F (37.3 C)  Resp: 17    Intake/Output Summary (Last 24 hours) at 01/04/14 1758 Last data filed at 01/04/14 1400  Gross per 24 hour  Intake   1900 ml  Output     50 ml  Net   1850 ml   Filed Weights   01/03/14 1915 01/04/14 0528  Weight: 72.576 kg (160 lb) 73.1 kg (161 lb 2.5 oz)    Exam:  General: alert & oriented x 3 In NAD Cardiovascular: RRR, nl S1 s2 Respiratory: CTAB Abdomen: soft +BS 3 small incisions with dressing clean and dry, no masses palpable Extremities: No cyanosis and no edema    Data Reviewed: Basic Metabolic Panel:  Recent Labs Lab 01/03/14 1930 01/04/14 0127 01/04/14 0653 01/04/14 1343  NA 130* 132* 135* 137  K 4.0 3.5* 3.3* 4.3  CL 88* 96 97 102  CO2 23 23 23 23   GLUCOSE 397* 259* 275* 150*  BUN 13 10 10 8   CREATININE 0.46* 0.40* 0.42* 0.48*  CALCIUM 9.6 8.2* 7.9* 7.6*   Liver Function Tests:  Recent Labs Lab 01/03/14 1930  AST 9  ALT 10  ALKPHOS 77  BILITOT 0.5  PROT 7.7  ALBUMIN 3.7    Recent Labs Lab 01/03/14 1930  LIPASE 28   No results found for this basename: AMMONIA,  in the last 168 hours CBC:  Recent Labs Lab 01/03/14 1930 01/04/14 0653  WBC 11.3* 8.6  NEUTROABS 7.8*  --   HGB 13.4 11.7*  HCT 38.6 34.2*  MCV 82.5 84.4  PLT 198 147*    Cardiac Enzymes: No results found for this basename: CKTOTAL, CKMB, CKMBINDEX, TROPONINI,  in the last 168 hours BNP (last 3 results) No results found for this basename: PROBNP,  in the last 8760 hours CBG:  Recent Labs Lab 01/04/14 1119 01/04/14 1230 01/04/14 1338 01/04/14 1444 01/04/14 1646  GLUCAP 162* 154* 170* 138* 198*    Recent Results (from the past 240 hour(s))  SURGICAL PCR SCREEN     Status: Abnormal   Collection Time    01/04/14  8:05 AM      Result Value Ref Range Status   MRSA, PCR NEGATIVE  NEGATIVE Final   Staphylococcus aureus POSITIVE (*) NEGATIVE Final   Comment:            The Xpert SA Assay (FDA     approved for NASAL specimens     in patients over 57 years of age),     is one component of     a comprehensive surveillance     program.  Test performance has     been validated by [employer] for patients greater     than or equal to 78 year old.     It is not intended     to diagnose infection nor to  guide or monitor treatment.     Studies: Dg Cholangiogram Operative  01/04/2014   CLINICAL DATA:  Cholecystitis, laparoscopic cholangiogram  EXAM: INTRAOPERATIVE CHOLANGIOGRAM  TECHNIQUE: Cholangiographic images from the C-arm fluoroscopic device were submitted for interpretation post-operatively. Please see the procedural report for the amount of contrast and the fluoroscopy time utilized.  COMPARISON:  01/04/2014  FINDINGS: Intraoperative cholangiogram performed during laparoscopic cholecystectomy. This demonstrates patency of the intrahepatic ducts, biliary confluence, common hepatic duct, accessory right duct, cystic duct, and the common bile duct. No biliary obstruction or dilatation. No filling defect or retained stone. Contrast opacifies the duodenum.  IMPRESSION: Patent biliary system.   Electronically Signed   By: Ruel Favors M.D.   On: 01/04/2014 10:11   Ct Abdomen Pelvis W Contrast  01/04/2014   CLINICAL DATA:  Diffuse abdominal pain  radiating to back. Evaluate for diverticulitis.  EXAM: CT ABDOMEN AND PELVIS WITH CONTRAST  TECHNIQUE: Multidetector CT imaging of the abdomen and pelvis was performed using the standard protocol following bolus administration of intravenous contrast.  CONTRAST:  OMNIPAQUE IOHEXOL 300 MG/ML  SOLN  COMPARISON:  CT of the abdomen and pelvis February 15, 2013  FINDINGS: LUNG BASES: Status post right lower lobe apparent wedge resection. Minimal left lung base pleural thickening without pleural effusions or focal consolidations there are coronary artery calcifications partly imaged. Heart size is normal, small, slightly decreased pericardial effusion.  SOLID ORGANS: The spleen, pancreas and adrenal glands are unremarkable. Gallbladder distended, with gallbladder wall thickening, pericholecystic fluid and inflammatory changes. No CT findings of cholelithiasis. The liver is mildly enlarged, diffusely hypodense most consistent with hepatic steatosis.  GASTROINTESTINAL TRACT: The stomach, small and large bowel are normal in course and caliber without inflammatory changes. Enteric contrast has not yet reached the distal small bowel. Normal appendix.  KIDNEYS/ URINARY TRACT: Kidneys are orthotopic, demonstrating symmetric enhancement. No nephrolithiasis, hydronephrosis or renal masses. Minimal cortical scarring of the right kidney. The unopacified ureters are normal in course and caliber. Delayed imaging through the kidneys demonstrates symmetric prompt excretion to the proximal urinary collecting system. Urinary bladder is partially distended and unremarkable.  PERITONEUM/RETROPERITONEUM: No intraperitoneal free air. Aortoiliac vessels are normal in course and caliber, minimal calcific atherosclerosis. No lymphadenopathy by CT size criteria. Internal reproductive organs are unremarkable. Stable 2.6 cm calcified fatty mass in pelvis may reflect remote omental infarct or epiploic appendagitis.  SOFT TISSUE/OSSEOUS  STRUCTURES: Nonsuspicious. Severe L5-S1 degenerative disc disease resulting in severe right L5-S1 neural foraminal narrowing.  IMPRESSION: Acute cholecystitis.  No CT findings of cholelithiasis.  Right renal cortical scarring (which may be secondary to patient's prior pyelonephritis).  Stable mild hepatomegaly and fatty liver.  Findings discussed with and reconfirmed by Dr.PETER DAMMEN on7/22/2015at12:50 am.   Electronically Signed   By: Awilda Metro   On: 01/04/2014 00:51   US Abdomen Limited Ruq  01/04/2014   CLINICAL DATA:  Right upper quadrant abdominal pain.  EXAM: US ABDOMEN LIMITED - RIGHT UPPER QUADRANT  COMPARISON:  CT of the abdomen and pelvis performed earlier today at 12:31 a.m.  FINDINGS: Gallbladder:  Distended, with numerous layering stones seen dependently in the gallbladder, measuring up to 1.3 cm in size. Underlying echogenic sludge is noted. Significant gallbladder wall thickening is seen, with pericholecystic fluid. A positive ultrasonographic Murphy's sign is elicited.  Common bile duct:  Diameter: 0.4 cm, within normal limits in caliber.  Liver:  A 2.8 x 2.5 x 2.1 cm focus of decreased echogenicity adjacent to the gallbladder is thought  to reflect focal fatty sparing. Diffuse fatty infiltration is noted within the liver, with diffusely increased echogenicity and coarsened echotexture.  IMPRESSION: 1. Acute cholecystitis noted, with cholelithiasis, gallbladder distention, significant gallbladder wall thickening and pericholecystic fluid. Positive ultrasonographic Murphy's sign elicited. The common hepatic duct remains normal in caliber. 2. Diffuse fatty infiltration within the liver. 2.8 cm focus of decreased echogenicity adjacent to the gallbladder is thought to reflect focal fatty sparing.   Electronically Signed   By: Roanna RaiderJeffery  Chang M.D.   On: 01/04/2014 02:21    Scheduled Meds: . [START ON 01/05/2014] enoxaparin (LOVENOX) injection  40 mg Subcutaneous Q24H  . HYDROmorphone       . insulin aspart  0-9 Units Subcutaneous 6 times per day  . morphine      . pantoprazole (PROTONIX) IV  40 mg Intravenous QHS  . piperacillin-tazobactam  3.375 g Intravenous Once  . piperacillin-tazobactam (ZOSYN)  IV  3.375 g Intravenous Q8H   Continuous Infusions: . dextrose 5 % and 0.9 % NaCl with KCl 20 mEq/L 100 mL/hr at 01/04/14 1236    Principal Problem:   Cholecystitis, acute with cholelithiasis Active Problems:   Hyperglycemia   Increased anion gap metabolic acidosis   Diabetes mellitus type 2 in obese   Hypokalemia    Time spent: 35    [facility] CenterVIYUOH,Solomon Skowronek C  [facility] Pager [phone]. If 7PM-7AM, please contact night-coverage at [email or web address], password [email or web address] 01/04/2014, 5:58 PM  LOS: 1 day

## 2014-01-04 NOTE — ED Provider Notes (Signed)
Medical screening examination/treatment/procedure(s) were conducted as a shared visit with non-physician practitioner(s) and myself.  I personally evaluated the patient during the encounter.   EKG Interpretation None      Pt with RUQ abd pain. CT had been ordered, and shows cholecystitis. PT on exam has Murphy's signs and also is noted to be hyperglycemic with AG of 19. Possible mild DKA from underlying cholecystitis.  PA to call surgery. Insulin drip started.  CRITICAL CARE Performed by: Derwood KaplanNanavati, Erlin Gardella   Total critical care time: 40 min, DKA with choelcystitis  Critical care time was exclusive of separately billable procedures and treating other patients.  Critical care was necessary to treat or prevent imminent or life-threatening deterioration.  Critical care was time spent personally by me on the following activities: development of treatment plan with patient and/or surrogate as well as nursing, discussions with consultants, evaluation of patient's response to treatment, examination of patient, obtaining history from patient or surrogate, ordering and performing treatments and interventions, ordering and review of laboratory studies, ordering and review of radiographic studies, pulse oximetry and re-evaluation of patient's condition.   Derwood KaplanAnkit Autumne Kallio, MD 01/04/14 [phone]

## 2014-01-04 NOTE — ED Notes (Signed)
CT notified pt completed po contrast.  

## 2014-01-04 NOTE — Progress Notes (Signed)
Inpatient Diabetes Program Recommendations  AACE/ADA: New Consensus Statement on Inpatient Glycemic Control (2013)  Target Ranges:  Prepandial:   less than 140 mg/dL      Peak postprandial:   less than 180 mg/dL (1-2 hours)      Critically ill patients:  140 - 180 mg/dL   Reason for Assessment: Insulin gtt for surgery  Diabetes history: Type 2 Outpatient Diabetes medications: Lantus 60 units at HS, Metformin 1042m bid Current orders for Inpatient glycemic control: GlucoStabilizer  Note:  Patient started on insulin drip for glucose management during surgery.  Has met criteria for transition off drip.  Last drip rate was high at 6.6 units/hr.  Last known A1C was 13.4 in Sept 2014.   Suggest MD order Glycemic Control Order Set-- including half to full home dose of Lantus at transition off drip and moderate to resistant correction.  Please consider ordering an A1C also.  Thank you.  Nichol Ator S. RMarcelline Mates RN, CNS, CDE Inpatient Diabetes Program, team pager [phone]

## 2014-01-04 NOTE — ED Notes (Signed)
Attempted report 

## 2014-01-04 NOTE — Progress Notes (Signed)
58yo female c/o mid abdominal/back pain since Saturday, diarrhea x1 but no other sx, CT and US reveal cholecystitis, plan for OR, to begin IV ABX.  Will start Zosyn 3.375g IV Q8H for CrCl ~1675ml/min and monitor CBC and Cx.  Vernard GamblesVeronda Kelyn Koskela, PharmD, BCPS 01/04/2014 4:21 AM

## 2014-01-04 NOTE — Progress Notes (Signed)
Nutrition Brief Note  Patient identified on the Malnutrition Screening Tool (MST) Report  Wt Readings from Last 15 Encounters:  01/04/14 161 lb 2.5 oz (73.1 kg)  01/04/14 161 lb 2.5 oz (73.1 kg)  02/19/13 164 lb 12.8 oz (74.753 kg)  06/22/11 177 lb 11.1 oz (80.6 kg)    Body mass index is 28.55 kg/(m^2). Patient meets criteria for overweight based on current BMI.   Current diet order is carb modified, pt reports that she will be able to consume adequate po on her own. Labs and medications reviewed.   No nutrition interventions warranted at this time. If nutrition issues arise, please consult RD.   Emily LatusHaley Farmer RD, LDN

## 2014-01-04 NOTE — Progress Notes (Signed)
Insulin gtt changed to 6.1

## 2014-01-04 NOTE — Anesthesia Postprocedure Evaluation (Signed)
  Anesthesia Post-op Note  Patient: Emily Farmer  Procedure(s) Performed: Procedure(s): LAPAROSCOPIC CHOLECYSTECTOMY WITH INTRAOPERATIVE CHOLANGIOGRAM (N/A)  Patient Location: PACU  Anesthesia Type:General  Level of Consciousness: awake, alert , oriented and patient cooperative  Airway and Oxygen Therapy: Patient Spontanous Breathing  Post-op Pain: mild  Post-op Assessment: Post-op Vital signs reviewed, Patient's Cardiovascular Status Stable, Respiratory Function Stable, Patent Airway and No signs of Nausea or vomiting  Post-op Vital Signs: stable  Last Vitals:  Filed Vitals:   01/04/14 1130  BP:   Pulse: 87  Temp:   Resp: 17    Complications: No apparent anesthesia complications

## 2014-01-04 NOTE — Op Note (Signed)
01/03/2014 - 01/04/2014  10:48 AM  PATIENT:  Emily Farmer  58 y.o. female  PRE-OPERATIVE DIAGNOSIS:  Acute cholecystitis  POST-OPERATIVE DIAGNOSIS:  Acute cholecystitis  PROCEDURE:  Procedure(s): LAPAROSCOPIC CHOLECYSTECTOMY WITH INTRAOPERATIVE CHOLANGIOGRAM (N/A)  SURGEON:  Surgeon(s) and Role:    * Axel FillerArmando Alizee Maple, MD - Primary  PHYSICIAN ASSISTANT: Norm SaltMary Stuckey- PA student  ASSISTANTS: none   ANESTHESIA:   local and general  EBL:  Total I/O In: 1000 [I.V.:1000] Out: -   BLOOD ADMINISTERED:none  DRAINS: none   LOCAL MEDICATIONS USED:  BUPIVICAINE   SPECIMEN:  Source of Specimen:  gallbladder  DISPOSITION OF SPECIMEN:  PATHOLOGY  COUNTS:  YES  TOURNIQUET:  * No tourniquets in log *  DICTATION: .Dragon Dictation  Details of the procedure:  The patient was taken to the operating and placed in the supine position with bilateral SCDs in place. A time out was called and all facts were verified. A pneumoperitoneum was obtained via A Veress needle technique to a pressure of 14mm of mercury. A 5mm trochar was then placed in the right upper quadrant under visualization, and there were no injuries to any abdominal organs. A 11 mm port was then placed in the umbilical region after infiltrating with local anesthesia under direct visualization. A second and third epigastric port and right lower quadrant port placement under direct visualization, respectively. The gallbladder was identified and retracted, the peritoneum was then sharply dissected from the gallbladder and this dissection was carried down to Calot's triangle. The gallbladder was identified and stripped away circumferentially and seen going into the gallbladder 360. A Cook catheter was used to perform an intraoperative cholangiogram. The biliary radicals as well as the cystic duct and common bile duct were seen free of filling defects.  2 clips were placed proximally one distally and the cystic duct transected. The cystic  artery was identified and 2 clips placed proximally and one distally and transected.  We then proceeded to remove the gallbladder off the hepatic fossa with Bovie cautery. An Endo Catch bag was then placed in the abdomen and gallbladder placed in the bag. The hepatic fossa was then reexamined and hemostasis was achieved with Bovie cautery and was excellent at the end of the case. The subhepatic fossa and perihepatic fossa was then irrigated until the effluent was clear. The 11 mm trocar fascia was reapproximated with the Endo Close #1 Vicryl x4.  The pneumoperitoneum was evacuated and all trochars removed under direct visulalization.  The skin was then closed with 4-0 Monocryl and the skin dressed with Steri-Strips, gauze, and tape.  The patient was awaken from general anesthesia and taken to the recovery room in stable condition.  PLAN OF CARE: already admitted  PATIENT DISPOSITION:  PACU - hemodynamically stable.   Delay start of Pharmacological VTE agent (>24hrs) due to surgical blood loss or risk of bleeding: not applicable

## 2014-01-05 ENCOUNTER — Encounter (HOSPITAL_COMMUNITY): Payer: Self-pay | Admitting: General Surgery

## 2014-01-05 DIAGNOSIS — E119 Type 2 diabetes mellitus without complications: Secondary | ICD-10-CM

## 2014-01-05 DIAGNOSIS — E669 Obesity, unspecified: Secondary | ICD-10-CM

## 2014-01-05 LAB — GLUCOSE, CAPILLARY
GLUCOSE-CAPILLARY: 128 mg/dL — AB (ref 70–99)
GLUCOSE-CAPILLARY: 148 mg/dL — AB (ref 70–99)
GLUCOSE-CAPILLARY: 207 mg/dL — AB (ref 70–99)
Glucose-Capillary: 146 mg/dL — ABNORMAL HIGH (ref 70–99)
Glucose-Capillary: 206 mg/dL — ABNORMAL HIGH (ref 70–99)

## 2014-01-05 MED ORDER — PANTOPRAZOLE SODIUM 40 MG PO TBEC
40.0000 mg | DELAYED_RELEASE_TABLET | Freq: Every day | ORAL | Status: DC
  Administered 2014-01-05: 40 mg via ORAL
  Filled 2014-01-05: qty 1

## 2014-01-05 MED ORDER — OXYCODONE-ACETAMINOPHEN 5-325 MG PO TABS: 1.0000 | ORAL_TABLET | ORAL | Status: DC | PRN

## 2014-01-05 NOTE — Discharge Instructions (Signed)
DO NOT TAKE METFORMIN UNTIL 01-07-14  [facility] ______[facility] LAPAROSCOPIC SURGERY: POST OP INSTRUCTIONS Always review your discharge instruction sheet given to you by the facility where your surgery was performed. IF YOU HAVE DISABILITY OR FAMILY LEAVE FORMS, YOU MUST BRING THEM TO THE OFFICE FOR PROCESSING.   DO NOT GIVE THEM TO YOUR DOCTOR.  1. A prescription for pain medication may be given to you upon discharge.  Take your pain medication as prescribed, if needed.  If narcotic pain medicine is not needed, then you may take acetaminophen (Tylenol) or ibuprofen (Advil) as needed. 2. Take your usually prescribed medications unless otherwise directed. 3. If you need a refill on your pain medication, please contact your pharmacy.  They will contact our office to request authorization. Prescriptions will not be filled after 5pm or on week-ends. 4. You should follow a light diet the first few days after arrival home, such as soup and crackers, etc.  Be sure to include lots of fluids daily. 5. Most patients will experience some swelling and bruising in the area of the incisions.  Ice packs will help.  Swelling and bruising can take several days to resolve.  6. It is common to experience some constipation if taking pain medication after surgery.  Increasing fluid intake and taking a stool softener (such as Colace) will usually help or prevent this problem from occurring.  A mild laxative (Milk of Magnesia or Miralax) should be taken according to package instructions if there are no bowel movements after 48 hours. 7. Unless discharge instructions indicate otherwise, you may remove your bandages 24-48 hours after surgery, and you may shower at that time.  You may have steri-strips (small skin tapes) in place directly over the incision.  These strips should be left on the skin for 7-10 days.  If your surgeon used skin glue on the incision, you may shower in 24 hours.  The glue will flake off  over the next 2-3 weeks.  Any sutures or staples will be removed at the office during your follow-up visit. 8. ACTIVITIES:  You may resume regular (light) daily activities beginning the next day--such as daily self-care, walking, climbing stairs--gradually increasing activities as tolerated.  You may have sexual intercourse when it is comfortable.  Refrain from any heavy lifting or straining until approved by your doctor. a. You may drive when you are no longer taking prescription pain medication, you can comfortably wear a seatbelt, and you can safely maneuver your car and apply brakes. b. RETURN TO WORK:  __________________________________________________________ 9. You should see your doctor in the office for a follow-up appointment approximately 2-3 weeks after your surgery.  Make sure that you call for this appointment within a day or two after you arrive home to insure a convenient appointment time. 10. OTHER INSTRUCTIONS: __________________________________________________________________________________________________________________________ __________________________________________________________________________________________________________________________ WHEN TO CALL YOUR DOCTOR: 1. Fever over 101.0 2. Inability to urinate 3. Continued bleeding from incision. 4. Increased pain, redness, or drainage from the incision. 5. Increasing abdominal pain  The clinic staff is available to answer your questions during regular business hours.  Please dont hesitate to call and ask to speak to one of the nurses for clinical concerns.  If you have a medical emergency, go to the nearest emergency room or call 911.  A surgeon from [facility] is always on call at the hospital. [address], [address], [place], [place]  [address] ? [address] [address], [place], [place]   [id number] [phone] ?  [phone] ? FAX [phone] Web site: [email or web address]

## 2014-01-05 NOTE — Discharge Summary (Signed)
Patient ID: Emily Farmer MRN: [id number] DOB/AGE: 57-Dec-1957 57 y.o.  Admit date: 01/03/2014 Discharge date: 01/05/2014  Procedures: lap chole with IOC  Consults: hospitalists  Reason for Admission: [age] yof with history significant for diabetes who presents with ruq pain since Saturday that has never occurred before. This has progressively worsened and began radiating to her back. This was getting no better at home leading her to come to er. No n/v. No changes in bms. Not eating much.  Admission Diagnoses:  1. Acute cholecystitis 2. DM 3. HTN  Hospital Course: The patient was admitted and taken to the OR where she underwent a lap chole with IOC.  She tolerated this well.  She was tolerating a regular diet and her pain was well controlled on POD1.  She was stable for dc home.  DM: Her CBGs were elevated upon arrival.  She was placed on an insuline drip and the hospitalist were consulted for management.  This was able to be weaned and she was transitioned to SSI.  This was stable at time of discharge.  All other medical problems were stable during her admission.   Discharge Diagnoses:  Principal Problem:   Cholecystitis, acute with cholelithiasis Active Problems:   Hyperglycemia   Increased anion gap metabolic acidosis   Diabetes mellitus type 2 in obese   Hypokalemia s/p lap chole with IOC  Discharge Medications:   Medication List         insulin glargine 100 UNIT/ML injection  Commonly known as:  LANTUS  Inject 60 Units into the skin at bedtime.     metFORMIN 1000 MG tablet  Commonly known as:  GLUCOPHAGE  Take 1,000 mg by mouth 2 (two) times daily with a meal.     niacin-simvastatin 500-20 MG 24 hr tablet  Commonly known as:  SIMCOR  Take 1 tablet by mouth at bedtime.     oxyCODONE-acetaminophen 5-325 MG per tablet  Commonly known as:  ROXICET  Take 1-2 tablets by mouth every 4 (four) hours as needed for severe pain.        Discharge Instructions:      Follow-up Information   Follow up with [facility] On 01/24/2014. (You have an appointment at 3:45 PM, be at the office 30 minutes early for check in.)    Contact information:   [address]   [place] [phone]       Signed: Letha CapeOSBORNE,Junious Ragone E 01/05/2014, 1:59 PM

## 2014-01-05 NOTE — Progress Notes (Signed)
[facility] PROGRESS NOTE  Emily Farmer ZOX:[id number] DOB: 1955-09-07 DOA: 01/03/2014 PCP: PROVIDER NOT IN SYSTEM  Assessment/Plan: Uncontrolled Diabetes mellitus type 2/mild DKA - DKA resolved, continue lantus and resume her outpatient metformin upon discharge  -Follow up with PCP in one to 2 weeks Cholecystitis, acute with cholelithiasis  -S/P cholecystectomy today 7/22, per surgery  Active Problems:  Increased anion gap metabolic acidosis   -d/t #1, resolved Hypokalemia - k  Was replaced in the hospital   Code Status: full Family Communication: none at bedside  Disposition Plan: DC plans per primary team noted     HPI/Subjective: Pt ambulating in room, denies any c/o, tolerating PO well.   Objective: Filed Vitals:   01/05/14 1325  BP: 105/65  Pulse: 103  Temp: 99.7 F (37.6 C)  Resp: 17    Intake/Output Summary (Last 24 hours) at 01/05/14 1638 Last data filed at 01/05/14 1556  Gross per 24 hour  Intake   2329 ml  Output   3600 ml  Net  -1271 ml   Filed Weights   01/03/14 1915 01/04/14 0528  Weight: 72.576 kg (160 lb) 73.1 kg (161 lb 2.5 oz)    Exam:  General: alert & oriented x 3 In NAD Cardiovascular: RRR, nl S1 s2 Respiratory: CTAB Abdomen: soft +BS 3 small incisions with dressing clean and dry, no masses palpable Extremities: No cyanosis and no edema    Data Reviewed: Basic Metabolic Panel:  Recent Labs Lab 01/03/14 1930 01/04/14 0127 01/04/14 0653 01/04/14 1343  NA 130* 132* 135* 137  K 4.0 3.5* 3.3* 4.3  CL 88* 96 97 102  CO2 23 23 23 23   GLUCOSE 397* 259* 275* 150*  BUN 13 10 10 8   CREATININE 0.46* 0.40* 0.42* 0.48*  CALCIUM 9.6 8.2* 7.9* 7.6*   Liver Function Tests:  Recent Labs Lab 01/03/14 1930  AST 9  ALT 10  ALKPHOS 77  BILITOT 0.5  PROT 7.7  ALBUMIN 3.7    Recent Labs Lab 01/03/14 1930  LIPASE 28   No results found for this basename: AMMONIA,  in the last 168 hours CBC:  Recent Labs Lab  01/03/14 1930 01/04/14 0653  WBC 11.3* 8.6  NEUTROABS 7.8*  --   HGB 13.4 11.7*  HCT 38.6 34.2*  MCV 82.5 84.4  PLT 198 147*   Cardiac Enzymes: No results found for this basename: CKTOTAL, CKMB, CKMBINDEX, TROPONINI,  in the last 168 hours BNP (last 3 results) No results found for this basename: PROBNP,  in the last 8760 hours CBG:  Recent Labs Lab 01/04/14 2359 01/05/14 0401 01/05/14 0725 01/05/14 1129 01/05/14 1629  GLUCAP 148* 146* 128* 207* 206*    Recent Results (from the past 240 hour(s))  SURGICAL PCR SCREEN     Status: Abnormal   Collection Time    01/04/14  8:05 AM      Result Value Ref Range Status   MRSA, PCR NEGATIVE  NEGATIVE Final   Staphylococcus aureus POSITIVE (*) NEGATIVE Final   Comment:            The Xpert SA Assay (FDA     approved for NASAL specimens     in patients over 57 years of age),     is one component of     a comprehensive surveillance     program.  Test performance has     been validated by [employer] for patients greater     than or equal  to 57 year old.     It is not intended     to diagnose infection nor to     guide or monitor treatment.     Studies: Dg Cholangiogram Operative  01/04/2014   CLINICAL DATA:  Cholecystitis, laparoscopic cholangiogram  EXAM: INTRAOPERATIVE CHOLANGIOGRAM  TECHNIQUE: Cholangiographic images from the C-arm fluoroscopic device were submitted for interpretation post-operatively. Please see the procedural report for the amount of contrast and the fluoroscopy time utilized.  COMPARISON:  01/04/2014  FINDINGS: Intraoperative cholangiogram performed during laparoscopic cholecystectomy. This demonstrates patency of the intrahepatic ducts, biliary confluence, common hepatic duct, accessory right duct, cystic duct, and the common bile duct. No biliary obstruction or dilatation. No filling defect or retained stone. Contrast opacifies the duodenum.  IMPRESSION: Patent biliary system.   Electronically Signed    By: Ruel Favorsrevor  Shick M.D.   On: 01/04/2014 10:11   Ct Abdomen Pelvis W Contrast  01/04/2014   CLINICAL DATA:  Diffuse abdominal pain radiating to back. Evaluate for diverticulitis.  EXAM: CT ABDOMEN AND PELVIS WITH CONTRAST  TECHNIQUE: Multidetector CT imaging of the abdomen and pelvis was performed using the standard protocol following bolus administration of intravenous contrast.  CONTRAST:  100mL OMNIPAQUE IOHEXOL 300 MG/ML  SOLN  COMPARISON:  CT of the abdomen and pelvis February 15, 2013  FINDINGS: LUNG BASES: Status post right lower lobe apparent wedge resection. Minimal left lung base pleural thickening without pleural effusions or focal consolidations there are coronary artery calcifications partly imaged. Heart size is normal, small, slightly decreased pericardial effusion.  SOLID ORGANS: The spleen, pancreas and adrenal glands are unremarkable. Gallbladder distended, with gallbladder wall thickening, pericholecystic fluid and inflammatory changes. No CT findings of cholelithiasis. The liver is mildly enlarged, diffusely hypodense most consistent with hepatic steatosis.  GASTROINTESTINAL TRACT: The stomach, small and large bowel are normal in course and caliber without inflammatory changes. Enteric contrast has not yet reached the distal small bowel. Normal appendix.  KIDNEYS/ URINARY TRACT: Kidneys are orthotopic, demonstrating symmetric enhancement. No nephrolithiasis, hydronephrosis or renal masses. Minimal cortical scarring of the right kidney. The unopacified ureters are normal in course and caliber. Delayed imaging through the kidneys demonstrates symmetric prompt excretion to the proximal urinary collecting system. Urinary bladder is partially distended and unremarkable.  PERITONEUM/RETROPERITONEUM: No intraperitoneal free air. Aortoiliac vessels are normal in course and caliber, minimal calcific atherosclerosis. No lymphadenopathy by CT size criteria. Internal reproductive organs are unremarkable.  Stable 2.6 cm calcified fatty mass in pelvis may reflect remote omental infarct or epiploic appendagitis.  SOFT TISSUE/OSSEOUS STRUCTURES: Nonsuspicious. Severe L5-S1 degenerative disc disease resulting in severe right L5-S1 neural foraminal narrowing.  IMPRESSION: Acute cholecystitis.  No CT findings of cholelithiasis.  Right renal cortical scarring (which may be secondary to patient's prior pyelonephritis).  Stable mild hepatomegaly and fatty liver.  Findings discussed with and reconfirmed by Dr.PETER DAMMEN on7/22/2015at12:50 am.   Electronically Signed   By: Awilda Metroourtnay  Bloomer   On: 01/04/2014 00:51   [place] Abdomen Limited Ruq  01/04/2014   CLINICAL DATA:  Right upper quadrant abdominal pain.  EXAM: US ABDOMEN LIMITED - RIGHT UPPER QUADRANT  COMPARISON:  CT of the abdomen and pelvis performed earlier today at 12:31 a.m.  FINDINGS: Gallbladder:  Distended, with numerous layering stones seen dependently in the gallbladder, measuring up to 1.3 cm in size. Underlying echogenic sludge is noted. Significant gallbladder wall thickening is seen, with pericholecystic fluid. A positive ultrasonographic Murphy's sign is elicited.  Common bile duct:  Diameter: 0.4 cm,  within normal limits in caliber.  Liver:  A 2.8 x 2.5 x 2.1 cm focus of decreased echogenicity adjacent to the gallbladder is thought to reflect focal fatty sparing. Diffuse fatty infiltration is noted within the liver, with diffusely increased echogenicity and coarsened echotexture.  IMPRESSION: 1. Acute cholecystitis noted, with cholelithiasis, gallbladder distention, significant gallbladder wall thickening and pericholecystic fluid. Positive ultrasonographic Murphy's sign elicited. The common hepatic duct remains normal in caliber. 2. Diffuse fatty infiltration within the liver. 2.8 cm focus of decreased echogenicity adjacent to the gallbladder is thought to reflect focal fatty sparing.   Electronically Signed   By: Roanna Raider M.D.   On: 01/04/2014 02:21     Scheduled Meds: . enoxaparin (LOVENOX) injection  40 mg Subcutaneous Q24H  . insulin aspart  0-9 Units Subcutaneous 6 times per day  . pantoprazole  40 mg Oral Daily  . piperacillin-tazobactam (ZOSYN)  IV  3.375 g Intravenous Q8H  . pneumococcal 23 valent vaccine  0.5 mL Intramuscular Tomorrow-1000   Continuous Infusions: . dextrose 5 % and 0.9 % NaCl with KCl 20 mEq/L 100 mL/hr at 01/04/14 1236    Principal Problem:   Cholecystitis, acute with cholelithiasis Active Problems:   Hyperglycemia   Increased anion gap metabolic acidosis   Diabetes mellitus type 2 in obese   Hypokalemia    Time spent: 25    [facility] C  [facility] Pager [phone]. If 7PM-7AM, please contact night-coverage at [email or web address], password [facility] 01/05/2014, 4:38 PM  LOS: 2 days

## 2014-01-05 NOTE — Progress Notes (Addendum)
Inpatient Diabetes Program Recommendations  AACE/ADA: New Consensus Statement on Inpatient Glycemic Control (2013)  Target Ranges:  Prepandial:   less than 140 mg/dL      Peak postprandial:   less than 180 mg/dL (1-2 hours)      Critically ill patients:  140 - 180 mg/dL   Results for Emily BoehringerVOLL, Emily Farmer (MRN [id number]) as of 01/05/2014 11:27  Ref. Range 01/04/2014 09:12 01/04/2014 10:20 01/04/2014 11:19 01/04/2014 12:30 01/04/2014 13:38 01/04/2014 14:44 01/04/2014 16:46 01/04/2014 20:18 01/04/2014 23:59 01/05/2014 04:01  Glucose-Capillary Latest Range: 70-99 mg/dL 409191 (H) 811175 (H) 914162 (H) 154 (H) 170 (H) 138 (H) 198 (H) 244 (H) 148 (H) 146 (H)   Diabetes history: Type 2  Outpatient Diabetes medications: Lantus 60 units at HS, Metformin 1000mg  bid  Current orders for Inpatient glycemic control: Novolog 0-9 units Q4H  Inpatient Diabetes Program Recommendations Insulin - Basal: Noted patient received Lantus 30 units (one time order) at 15:18 on 7/22 at time of transition from IV to SQ insulin. If patient is not discharged, please order Lantus 30 units Q24H to start at 15:00. IV fluids: Please re-evaluate need to D5NS 20 KCl @ 100 ml/hr.  Thanks, Orlando PennerMarie Amiel Sharrow, RN, MSN, CCRN Diabetes Coordinator Inpatient Diabetes Program [phone] (Team Pager) [phone] ([facility] office) [phone] [facility] office)

## 2014-01-05 NOTE — Progress Notes (Cosign Needed)
1 Day Post-Op  Subjective: Feels well and tolerated breakfast. Denies nausea and vomiting. Had BM last night and passing flatus. Reports abdominal discomfort/soreness around incision sites. Able to ambulate.   Objective: Vital signs in last 24 hours: Temp:  [97.4 F (36.3 C)-99.2 F (37.3 C)] 98.2 F (36.8 C) (07/23 0519) Pulse Rate:  [79-92] 84 (07/23 0519) Resp:  [15-18] 17 (07/23 0519) BP: (99-114)/(53-71) 101/63 mmHg (07/23 0519) SpO2:  [93 %-100 %] 94 % (07/23 0519) Last BM Date: 01/04/14  Intake/Output from previous day: 07/22 0701 - 07/23 0700 In: 3884 [P.O.:600; I.V.:3284] Out: 2750 [Urine:2700; Blood:50] Intake/Output this shift:    General Appearance: WDWN white female resting in bed in NAD  Resp: CTAB with no rales, rhonchi or wheezing  Heart: RRR with no obvious gallops, rubs or murmurs, radial and pedal pulses intact bilaterally  GI: soft, ND, appropriately tender, +BS, dressings clean and dry    Lab Results:   Recent Labs  01/03/14 1930 01/04/14 0653  WBC 11.3* 8.6  HGB 13.4 11.7*  HCT 38.6 34.2*  PLT 198 147*   BMET  Recent Labs  01/04/14 0653 01/04/14 1343  NA 135* 137  K 3.3* 4.3  CL 97 102  CO2 23 23  GLUCOSE 275* 150*  BUN 10 8  CREATININE 0.42* 0.48*  CALCIUM 7.9* 7.6*   PT/INR No results found for this basename: LABPROT, INR,  in the last 72 hours ABG  Recent Labs  01/04/14 0135  HCO3 24.9*    Studies/Results: Dg Cholangiogram Operative  01/04/2014   CLINICAL DATA:  Cholecystitis, laparoscopic cholangiogram  EXAM: INTRAOPERATIVE CHOLANGIOGRAM  TECHNIQUE: Cholangiographic images from the C-arm fluoroscopic device were submitted for interpretation post-operatively. Please see the procedural report for the amount of contrast and the fluoroscopy time utilized.  COMPARISON:  01/04/2014  FINDINGS: Intraoperative cholangiogram performed during laparoscopic cholecystectomy. This demonstrates patency of the intrahepatic ducts, biliary  confluence, common hepatic duct, accessory right duct, cystic duct, and the common bile duct. No biliary obstruction or dilatation. No filling defect or retained stone. Contrast opacifies the duodenum.  IMPRESSION: Patent biliary system.   Electronically Signed   By: Ruel Favors M.D.   On: 01/04/2014 10:11   Ct Abdomen Pelvis W Contrast  01/04/2014   CLINICAL DATA:  Diffuse abdominal pain radiating to back. Evaluate for diverticulitis.  EXAM: CT ABDOMEN AND PELVIS WITH CONTRAST  TECHNIQUE: Multidetector CT imaging of the abdomen and pelvis was performed using the standard protocol following bolus administration of intravenous contrast.  CONTRAST:  OMNIPAQUE IOHEXOL 300 MG/ML  SOLN  COMPARISON:  CT of the abdomen and pelvis February 15, 2013  FINDINGS: LUNG BASES: Status post right lower lobe apparent wedge resection. Minimal left lung base pleural thickening without pleural effusions or focal consolidations there are coronary artery calcifications partly imaged. Heart size is normal, small, slightly decreased pericardial effusion.  SOLID ORGANS: The spleen, pancreas and adrenal glands are unremarkable. Gallbladder distended, with gallbladder wall thickening, pericholecystic fluid and inflammatory changes. No CT findings of cholelithiasis. The liver is mildly enlarged, diffusely hypodense most consistent with hepatic steatosis.  GASTROINTESTINAL TRACT: The stomach, small and large bowel are normal in course and caliber without inflammatory changes. Enteric contrast has not yet reached the distal small bowel. Normal appendix.  KIDNEYS/ URINARY TRACT: Kidneys are orthotopic, demonstrating symmetric enhancement. No nephrolithiasis, hydronephrosis or renal masses. Minimal cortical scarring of the right kidney. The unopacified ureters are normal in course and caliber. Delayed imaging through the kidneys demonstrates symmetric  prompt excretion to the proximal urinary collecting system. Urinary bladder is  partially distended and unremarkable.  PERITONEUM/RETROPERITONEUM: No intraperitoneal free air. Aortoiliac vessels are normal in course and caliber, minimal calcific atherosclerosis. No lymphadenopathy by CT size criteria. Internal reproductive organs are unremarkable. Stable 2.6 cm calcified fatty mass in pelvis may reflect remote omental infarct or epiploic appendagitis.  SOFT TISSUE/OSSEOUS STRUCTURES: Nonsuspicious. Severe L5-S1 degenerative disc disease resulting in severe right L5-S1 neural foraminal narrowing.  IMPRESSION: Acute cholecystitis.  No CT findings of cholelithiasis.  Right renal cortical scarring (which may be secondary to patient's prior pyelonephritis).  Stable mild hepatomegaly and fatty liver.  Findings discussed with and reconfirmed by Dr.PETER DAMMEN on7/22/2015at12:50 am.   Electronically Signed   By: Awilda Metroourtnay  Bloomer   On: 01/04/2014 00:51   [place] Abdomen Limited Ruq  01/04/2014   CLINICAL DATA:  Right upper quadrant abdominal pain.  EXAM: US ABDOMEN LIMITED - RIGHT UPPER QUADRANT  COMPARISON:  CT of the abdomen and pelvis performed earlier today at 12:31 a.m.  FINDINGS: Gallbladder:  Distended, with numerous layering stones seen dependently in the gallbladder, measuring up to 1.3 cm in size. Underlying echogenic sludge is noted. Significant gallbladder wall thickening is seen, with pericholecystic fluid. A positive ultrasonographic Murphy's sign is elicited.  Common bile duct:  Diameter: 0.4 cm, within normal limits in caliber.  Liver:  A 2.8 x 2.5 x 2.1 cm focus of decreased echogenicity adjacent to the gallbladder is thought to reflect focal fatty sparing. Diffuse fatty infiltration is noted within the liver, with diffusely increased echogenicity and coarsened echotexture.  IMPRESSION: 1. Acute cholecystitis noted, with cholelithiasis, gallbladder distention, significant gallbladder wall thickening and pericholecystic fluid. Positive ultrasonographic Murphy's sign elicited. The common  hepatic duct remains normal in caliber. 2. Diffuse fatty infiltration within the liver. 2.8 cm focus of decreased echogenicity adjacent to the gallbladder is thought to reflect focal fatty sparing.   Electronically Signed   By: Roanna RaiderJeffery  Chang M.D.   On: 01/04/2014 02:21    Anti-infectives: Anti-infectives   Start     Dose/Rate Route Frequency Ordered Stop   01/05/14 0100  piperacillin-tazobactam (ZOSYN) IVPB 3.375 g     3.375 g 12.5 mL/hr over 240 Minutes Intravenous Every 8 hours 01/04/14 1911     01/04/14 1200  piperacillin-tazobactam (ZOSYN) IVPB 3.375 g  Status:  Discontinued     3.375 g 12.5 mL/hr over 240 Minutes Intravenous Every 8 hours 01/04/14 0418 01/04/14 1911   01/04/14 0430  piperacillin-tazobactam (ZOSYN) IVPB 3.375 g     3.375 g 100 mL/hr over 30 Minutes Intravenous  Once 01/04/14 0418        Assessment/Plan: Cholecysitits, acute with cholelithasis  s/p Procedure(s): LAPAROSCOPIC CHOLECYSTECTOMY WITH INTRAOPERATIVE CHOLANGIOGRAM (N/A) 01/04/2014, Dr. Axel FillerArmando Ramirez Uncontrolled Diabetes mellitus type 2   Plan: Patient is tolerating diet, having normal BM and able to ambulate without difficulty. Abdominal soreness is appropraite. Vitals stable. Afebrile. Last CBG 128. Stable for discharge.    LOS: 2 days    Sammuel CooperMary Knight Marise Knapper, PA-S 01/05/2014

## 2014-01-06 NOTE — Addendum Note (Signed)
Addendum created 01/06/14 1506 by Laverle HobbyGregory Miley Lindon, MD   Modules edited: Anesthesia Attestations, Anesthesia Events

## 2014-01-24 ENCOUNTER — Ambulatory Visit (INDEPENDENT_AMBULATORY_CARE_PROVIDER_SITE_OTHER): Payer: Medicaid Other | Admitting: General Surgery

## 2014-01-24 ENCOUNTER — Encounter (INDEPENDENT_AMBULATORY_CARE_PROVIDER_SITE_OTHER): Payer: Self-pay

## 2014-01-24 VITALS — BP 128/84 | HR 64 | Temp 98.1°F | Resp 14 | Ht 63.0 in | Wt 155.4 lb

## 2014-01-24 DIAGNOSIS — K8013 Calculus of gallbladder with acute and chronic cholecystitis with obstruction: Secondary | ICD-10-CM

## 2014-01-24 DIAGNOSIS — Z9049 Acquired absence of other specified parts of digestive tract: Secondary | ICD-10-CM

## 2014-01-24 DIAGNOSIS — K8001 Calculus of gallbladder with acute cholecystitis with obstruction: Secondary | ICD-10-CM

## 2014-01-24 DIAGNOSIS — K8019 Calculus of gallbladder with other cholecystitis with obstruction: Secondary | ICD-10-CM

## 2014-01-24 DIAGNOSIS — Z9889 Other specified postprocedural states: Secondary | ICD-10-CM

## 2014-01-24 NOTE — Progress Notes (Signed)
  Subjective: Emily Farmer is a 58 y.o. female who had a laparoscopic cholecystectomy  on 01/04/14 by Dr. Derrell Lollingamirez  returns to the clinic today.  Pathology reveals chronic and necrotic cholecystitis and choleithiasis.  The patient is tolerating their diet well and is having no severe pain.  Bowel function is good.  The pre-operative symptoms of abdominal pain, nausea, and vomiting have resolved.  No problems with the wounds.  Pt is returning to normal activity / work.   Objective: Vital signs in last 24 hours: Reviewed   PE: General:  Alert, NAD, pleasant Abdomen:  soft, NT/ND, +bs, incisions appear well-healed with no sign of infection or bleeding   Assessment/Plan  1.  S/P Laparoscopic Cholecystectomy: doing well, may resume regular activity without restrictions, Pt will follow up with us PRN and knows to call with questions or concerns.      Aris GeorgiaDORT, Bria Sparr, PA-C 01/24/2014

## 2014-01-24 NOTE — Patient Instructions (Signed)
She may resume a regular diet and full activity.  She may follow-up on a PRN basis. 

## 2014-11-20 ENCOUNTER — Emergency Department (HOSPITAL_COMMUNITY)
Admission: EM | Admit: 2014-11-20 | Discharge: 2014-11-20 | Disposition: A | Discharge status: Home / Self Care | Payer: Medicaid Other | Attending: Emergency Medicine | Admitting: Emergency Medicine

## 2014-11-20 ENCOUNTER — Encounter (HOSPITAL_COMMUNITY): Payer: Self-pay | Admitting: Emergency Medicine

## 2014-11-20 DIAGNOSIS — Z794 Long term (current) use of insulin: Secondary | ICD-10-CM | POA: Insufficient documentation

## 2014-11-20 DIAGNOSIS — H109 Unspecified conjunctivitis: Secondary | ICD-10-CM | POA: Insufficient documentation

## 2014-11-20 DIAGNOSIS — E119 Type 2 diabetes mellitus without complications: Secondary | ICD-10-CM | POA: Diagnosis not present

## 2014-11-20 DIAGNOSIS — E785 Hyperlipidemia, unspecified: Secondary | ICD-10-CM | POA: Diagnosis not present

## 2014-11-20 DIAGNOSIS — I10 Essential (primary) hypertension: Secondary | ICD-10-CM | POA: Diagnosis not present

## 2014-11-20 DIAGNOSIS — Z79899 Other long term (current) drug therapy: Secondary | ICD-10-CM | POA: Diagnosis not present

## 2014-11-20 DIAGNOSIS — M1990 Unspecified osteoarthritis, unspecified site: Secondary | ICD-10-CM | POA: Insufficient documentation

## 2014-11-20 DIAGNOSIS — H5711 Ocular pain, right eye: Secondary | ICD-10-CM | POA: Diagnosis present

## 2014-11-20 MED ORDER — FLUORESCEIN SODIUM 1 MG OP STRP
1.0000 | ORAL_STRIP | Freq: Once | OPHTHALMIC | Status: AC
  Administered 2014-11-20: 1 via OPHTHALMIC
  Filled 2014-11-20: qty 1

## 2014-11-20 MED ORDER — TETRACAINE HCL 0.5 % OP SOLN
1.0000 [drp] | Freq: Once | OPHTHALMIC | Status: AC
  Administered 2014-11-20: 1 [drp] via OPHTHALMIC
  Filled 2014-11-20: qty 2

## 2014-11-20 MED ORDER — POLYMYXIN B-TRIMETHOPRIM 10000-0.1 UNIT/ML-% OP SOLN: 1.0000 [drp] | OPHTHALMIC | Status: DC

## 2014-11-20 NOTE — ED Notes (Signed)
Started yesterday with right eye irritation and redness. No known injury. Pain worse with light exposure.

## 2014-11-20 NOTE — ED Provider Notes (Signed)
CSN: [id number]     Arrival date & time 11/20/14  1914 History  This chart was scribed for non-physician practitioner, Roxy Horseman, PA-C working with Tilden Fossa, MD by Placido Sou, ED scribe. This patient was seen in room TR04C/TR04C and the patient's care was started at 10:12 AM.    Chief Complaint  Patient presents with  . Eye Pain    The history is provided by the patient. No language interpreter was used.    HPI Comments: Emily Farmer is a 59 y.o. female, with a history of DM and HLD, who presents to the Emergency Department complaining of right eye pain and swelling with onset earlier this morning. Pt notes the pain/irritation is excacerbated with movement and exposure to light. Pt denies any injury, foreign objects in her eye as well as any history of eye problems. Reports associated seasonal allergies. PCP: Fleet Contras  Past Medical History  Diagnosis Date  . Diabetes mellitus   . Hypertension   . Gout   . Hyperlipidemia   . Arthritis    Past Surgical History  Procedure Laterality Date  . Lung surgery    . Cholecystectomy N/A 01/04/2014    Procedure: LAPAROSCOPIC CHOLECYSTECTOMY WITH INTRAOPERATIVE CHOLANGIOGRAM;  Surgeon: Axel Filler, MD;  Location: [facility] OR;  Service: General;  Laterality: N/A;   Family History  Problem Relation Age of Onset  . Diabetes Sister   . High blood pressure Neg Hx   . High Cholesterol Neg Hx   . Heart attack Neg Hx    History  Substance Use Topics  . Smoking status: Never Smoker   . Smokeless tobacco: Never Used  . Alcohol Use: No   OB History    No data available     Review of Systems  Constitutional: Negative for fever and chills.  Eyes: Positive for photophobia, pain and redness.       Right eye  Respiratory: Negative for shortness of breath.   Cardiovascular: Negative for chest pain.  Gastrointestinal: Negative for nausea, vomiting, diarrhea and constipation.  Genitourinary: Negative for dysuria.       Allergies  Review of patient's allergies indicates no known allergies.  Home Medications   Prior to Admission medications   Medication Sig Start Date End Date Taking? Authorizing Provider  insulin glargine (LANTUS) 100 UNIT/ML injection Inject 60 Units into the skin at bedtime.    Historical Provider, MD  metFORMIN (GLUCOPHAGE) 1000 MG tablet Take 1,000 mg by mouth 2 (two) times daily with a meal.      Historical Provider, MD  niacin-simvastatin (SIMCOR) 500-20 MG 24 hr tablet Take 1 tablet by mouth at bedtime.    Historical Provider, MD  oxyCODONE-acetaminophen (ROXICET) 5-325 MG per tablet Take 1-2 tablets by mouth every 4 (four) hours as needed for severe pain. 01/05/14   Barnetta Chapel, PA-C   There were no vitals taken for this visit. Physical Exam  Constitutional: She is oriented to person, place, and time. She appears well-developed and well-nourished. No distress.  HENT:  Head: Normocephalic and atraumatic.  Mouth/Throat: Oropharynx is clear and moist.  Eyes: Conjunctivae and EOM are normal. Pupils are equal, round, and reactive to light.  No fluorescein uptake, normal slit lamp examination, no foreign body, mildly erythematous conjunctiva, visual acuity is 20/50, right eye pressure 12, left eye pressure 14  Neck: Normal range of motion. Neck supple. No tracheal deviation present.  Cardiovascular: Normal rate.   Pulmonary/Chest: Breath sounds normal. No respiratory distress.  Abdominal: Soft.  Musculoskeletal: Normal  range of motion.  Neurological: She is alert and oriented to person, place, and time.  Skin: Skin is warm and dry.  Psychiatric: She has a normal mood and affect. Her behavior is normal.  Nursing note and vitals reviewed.   ED Course  Procedures  DIAGNOSTIC STUDIES: Oxygen Saturation is 97% on RA, normal by my interpretation.    COORDINATION OF CARE: 10:25 AM Discussed treatment plan with pt at bedside and pt agreed to plan.   Labs Review Labs  Reviewed - No data to display  Imaging Review No results found.   EKG Interpretation None      MDM   Final diagnoses:  Conjunctivitis of right eye    Patient with right eye pain and irritation. Will treat with Polytrim. Suspect that this is most likely an allergic conjunctivitis but will cover with Polytrim indicates a possible early bacterial conjunctivitis. No foreign body, no corneal abrasion or laceration. No history of trauma. Normal eye pressures, doubt glaucoma. Will recommend ophthalmology follow-up. Patient understands this plan. She is stable and ready for discharge.  I personally performed the services described in this documentation, which was scribed in my presence. The recorded information has been reviewed and is accurate.     Roxy HorsemanRobert Vi Biddinger, PA-C 11/20/14 1029  Tilden FossaElizabeth Rees, MD 11/20/14 [phone]

## 2014-11-20 NOTE — Discharge Instructions (Signed)

## 2014-11-20 NOTE — ED Notes (Signed)
Pt has been discharged

## 2014-12-19 ENCOUNTER — Encounter (HOSPITAL_COMMUNITY): Payer: Self-pay | Admitting: Emergency Medicine

## 2014-12-19 ENCOUNTER — Emergency Department (HOSPITAL_COMMUNITY)
Admission: EM | Admit: 2014-12-19 | Discharge: 2014-12-19 | Disposition: A | Discharge status: Home / Self Care | Payer: Medicaid Other | Attending: Emergency Medicine | Admitting: Emergency Medicine

## 2014-12-19 DIAGNOSIS — S50811A Abrasion of right forearm, initial encounter: Secondary | ICD-10-CM | POA: Diagnosis not present

## 2014-12-19 DIAGNOSIS — M1990 Unspecified osteoarthritis, unspecified site: Secondary | ICD-10-CM | POA: Diagnosis not present

## 2014-12-19 DIAGNOSIS — E785 Hyperlipidemia, unspecified: Secondary | ICD-10-CM | POA: Insufficient documentation

## 2014-12-19 DIAGNOSIS — Y998 Other external cause status: Secondary | ICD-10-CM | POA: Insufficient documentation

## 2014-12-19 DIAGNOSIS — Y9389 Activity, other specified: Secondary | ICD-10-CM | POA: Insufficient documentation

## 2014-12-19 DIAGNOSIS — I10 Essential (primary) hypertension: Secondary | ICD-10-CM | POA: Insufficient documentation

## 2014-12-19 DIAGNOSIS — E119 Type 2 diabetes mellitus without complications: Secondary | ICD-10-CM | POA: Insufficient documentation

## 2014-12-19 DIAGNOSIS — Z79899 Other long term (current) drug therapy: Secondary | ICD-10-CM | POA: Insufficient documentation

## 2014-12-19 DIAGNOSIS — M109 Gout, unspecified: Secondary | ICD-10-CM | POA: Diagnosis not present

## 2014-12-19 DIAGNOSIS — S41152A Open bite of left upper arm, initial encounter: Secondary | ICD-10-CM | POA: Insufficient documentation

## 2014-12-19 DIAGNOSIS — Z794 Long term (current) use of insulin: Secondary | ICD-10-CM | POA: Insufficient documentation

## 2014-12-19 DIAGNOSIS — Y9289 Other specified places as the place of occurrence of the external cause: Secondary | ICD-10-CM | POA: Insufficient documentation

## 2014-12-19 DIAGNOSIS — W503XXA Accidental bite by another person, initial encounter: Secondary | ICD-10-CM

## 2014-12-19 MED ORDER — AMOXICILLIN-POT CLAVULANATE 875-125 MG PO TABS: 1.0000 | ORAL_TABLET | Freq: Two times a day (BID) | ORAL | Status: DC

## 2014-12-19 MED ORDER — AMOXICILLIN-POT CLAVULANATE 875-125 MG PO TABS
1.0000 | ORAL_TABLET | Freq: Once | ORAL | Status: AC
  Administered 2014-12-19: 1 via ORAL
  Filled 2014-12-19: qty 1
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
## 2015-12-07 ENCOUNTER — Emergency Department (HOSPITAL_COMMUNITY)
Admission: EM | Admit: 2015-12-07 | Discharge: 2015-12-07 | Disposition: A | Discharge status: Home / Self Care | Payer: Medicaid Other | Attending: Emergency Medicine | Admitting: Emergency Medicine

## 2015-12-07 ENCOUNTER — Encounter (HOSPITAL_COMMUNITY): Payer: Self-pay | Admitting: *Deleted

## 2015-12-07 DIAGNOSIS — Y939 Activity, unspecified: Secondary | ICD-10-CM | POA: Insufficient documentation

## 2015-12-07 DIAGNOSIS — S5011XA Contusion of right forearm, initial encounter: Secondary | ICD-10-CM | POA: Insufficient documentation

## 2015-12-07 DIAGNOSIS — I10 Essential (primary) hypertension: Secondary | ICD-10-CM | POA: Insufficient documentation

## 2015-12-07 DIAGNOSIS — S0093XA Contusion of unspecified part of head, initial encounter: Secondary | ICD-10-CM | POA: Insufficient documentation

## 2015-12-07 DIAGNOSIS — E119 Type 2 diabetes mellitus without complications: Secondary | ICD-10-CM | POA: Insufficient documentation

## 2015-12-07 DIAGNOSIS — Y929 Unspecified place or not applicable: Secondary | ICD-10-CM | POA: Insufficient documentation

## 2015-12-07 DIAGNOSIS — Y999 Unspecified external cause status: Secondary | ICD-10-CM | POA: Insufficient documentation

## 2015-12-07 NOTE — ED Notes (Signed)
Pt states she understands instructions. Performs teach back
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
## 2015-12-07 NOTE — Discharge Instructions (Signed)
Contusion °A contusion is a deep bruise. Contusions are the result of a blunt injury to tissues and muscle fibers under the skin. The injury causes bleeding under the skin. The skin overlying the contusion may turn blue, purple, or yellow. Minor injuries will give you a painless contusion, but more severe contusions may stay painful and swollen for a few weeks.  °CAUSES  °This condition is usually caused by a blow, trauma, or direct force to an area of the body. °SYMPTOMS  °Symptoms of this condition include: °· Swelling of the injured area. °· Pain and tenderness in the injured area. °· Discoloration. The area may have redness and then turn blue, purple, or yellow. °DIAGNOSIS  °This condition is diagnosed based on a physical exam and medical history. An X-ray, CT scan, or MRI may be needed to determine if there are any associated injuries, such as broken bones (fractures). °TREATMENT  °Specific treatment for this condition depends on what area of the body was injured. In general, the best treatment for a contusion is resting, icing, applying pressure to (compression), and elevating the injured area. This is often called the RICE strategy. Over-the-counter anti-inflammatory medicines may also be recommended for pain control.  °HOME CARE INSTRUCTIONS  °· Rest the injured area. °· If directed, apply ice to the injured area: °¨ Put ice in a plastic bag. °¨ Place a towel between your skin and the bag. °¨ Leave the ice on for 20 minutes, 2-3 times per day. °· If directed, apply light compression to the injured area using an elastic bandage. Make sure the bandage is not wrapped too tightly. Remove and reapply the bandage as directed by your health care provider. °· If possible, raise (elevate) the injured area above the level of your heart while you are sitting or lying down. °· Take over-the-counter and prescription medicines only as told by your health care provider. °SEEK MEDICAL CARE IF: °· Your symptoms do not  improve after several days of treatment. °· Your symptoms get worse. °· You have difficulty moving the injured area. °SEEK IMMEDIATE MEDICAL CARE IF:  °· You have severe pain. °· You have numbness in a hand or foot. °· Your hand or foot turns pale or cold. °  °This information is not intended to replace advice given to you by your health care provider. Make sure you discuss any questions you have with your health care provider. °  °Document Released: 03/12/2005 Document Revised: 02/21/2015 Document Reviewed: 10/18/2014 °Elsevier Interactive Patient Education ©2016 [employer] ° °Facial or Scalp Contusion °A facial or scalp contusion is a deep bruise on the face or head. Injuries to the face and head generally cause a lot of swelling, especially around the eyes. Contusions are the result of an injury that caused bleeding under the skin. The contusion may turn blue, purple, or yellow. Minor injuries will give you a painless contusion, but more severe contusions may stay painful and swollen for a few weeks.  °CAUSES  °A facial or scalp contusion is caused by a blunt injury or trauma to the face or head area.  °SIGNS AND SYMPTOMS  °· Swelling of the injured area.   °· Discoloration of the injured area.   °· Tenderness, soreness, or pain in the injured area.   °DIAGNOSIS  °The diagnosis can be made by taking a medical history and doing a physical exam. An X-ray exam, CT scan, or MRI may be needed to determine if there are any associated injuries, such as broken bones (fractures). °  TREATMENT  °Often, the best treatment for a facial or scalp contusion is applying cold compresses to the injured area. Over-the-counter medicines may also be recommended for pain control.  °HOME CARE INSTRUCTIONS  °· Only take over-the-counter or prescription medicines as directed by your health care provider.   °· Apply ice to the injured area.   °¨ Put ice in a plastic bag.   °¨ Place a towel between your skin and the bag.   °¨ Leave the  ice on for 20 minutes, 2-3 times a day.   °SEEK MEDICAL CARE IF: °· You have bite problems.   °· You have pain with chewing.   °· You are concerned about facial defects. °SEEK IMMEDIATE MEDICAL CARE IF: °· You have severe pain or a headache that is not relieved by medicine.   °· You have unusual sleepiness, confusion, or personality changes.   °· You throw up (vomit).   °· You have a persistent nosebleed.   °· You have double vision or blurred vision.   °· You have fluid drainage from your nose or ear.   °· You have difficulty walking or using your arms or legs.   °MAKE SURE YOU:  °· Understand these instructions. °· Will watch your condition. °· Will get help right away if you are not doing well or get worse. °  °This information is not intended to replace advice given to you by your health care provider. Make sure you discuss any questions you have with your health care provider. °  °Document Released: 07/10/2004 Document Revised: 06/23/2014 Document Reviewed: 01/13/2013 °Elsevier Interactive Patient Education ©2016 [employer] ° °

## 2015-12-13 ENCOUNTER — Emergency Department (HOSPITAL_COMMUNITY): Payer: Medicaid Other

## 2015-12-13 ENCOUNTER — Encounter (HOSPITAL_COMMUNITY): Payer: Self-pay

## 2015-12-13 ENCOUNTER — Emergency Department (HOSPITAL_COMMUNITY)
Admission: EM | Admit: 2015-12-13 | Discharge: 2015-12-14 | Disposition: A | Discharge status: Home / Self Care | Payer: Medicaid Other | Attending: Emergency Medicine | Admitting: Emergency Medicine

## 2015-12-13 DIAGNOSIS — W03XXXA Other fall on same level due to collision with another person, initial encounter: Secondary | ICD-10-CM | POA: Insufficient documentation

## 2015-12-13 DIAGNOSIS — S0083XA Contusion of other part of head, initial encounter: Secondary | ICD-10-CM | POA: Insufficient documentation

## 2015-12-13 DIAGNOSIS — Y939 Activity, unspecified: Secondary | ICD-10-CM | POA: Insufficient documentation

## 2015-12-13 DIAGNOSIS — Y92512 Supermarket, store or market as the place of occurrence of the external cause: Secondary | ICD-10-CM | POA: Insufficient documentation

## 2015-12-13 DIAGNOSIS — I10 Essential (primary) hypertension: Secondary | ICD-10-CM | POA: Insufficient documentation

## 2015-12-13 DIAGNOSIS — S0990XA Unspecified injury of head, initial encounter: Secondary | ICD-10-CM

## 2015-12-13 DIAGNOSIS — E119 Type 2 diabetes mellitus without complications: Secondary | ICD-10-CM | POA: Insufficient documentation

## 2015-12-13 DIAGNOSIS — Y999 Unspecified external cause status: Secondary | ICD-10-CM | POA: Insufficient documentation

## 2015-12-13 LAB — URINALYSIS, ROUTINE W REFLEX MICROSCOPIC
Bilirubin Urine: NEGATIVE
Ketones, ur: NEGATIVE mg/dL
Leukocytes, UA: NEGATIVE
Nitrite: POSITIVE — AB
PROTEIN: NEGATIVE mg/dL
SPECIFIC GRAVITY, URINE: 1.037 — AB (ref 1.005–1.030)
pH: 5 (ref 5.0–8.0)

## 2015-12-13 LAB — CBG MONITORING, ED: Glucose-Capillary: 395 mg/dL — ABNORMAL HIGH (ref 65–99)

## 2015-12-13 LAB — URINE MICROSCOPIC-ADD ON

## 2015-12-13 LAB — BASIC METABOLIC PANEL
Anion gap: 10 (ref 5–15)
BUN: 13 mg/dL (ref 6–20)
CALCIUM: 9.3 mg/dL (ref 8.9–10.3)
CHLORIDE: 96 mmol/L — AB (ref 101–111)
CO2: 26 mmol/L (ref 22–32)
CREATININE: 0.6 mg/dL (ref 0.44–1.00)
GFR calc non Af Amer: >60 mL/min (ref >60)
GLUCOSE: 397 mg/dL — AB (ref 65–99)
Potassium: 3.7 mmol/L (ref 3.5–5.1)
Sodium: 132 mmol/L — ABNORMAL LOW (ref 135–145)

## 2015-12-13 LAB — CBC
HCT: 38.8 % (ref 36.0–46.0)
Hemoglobin: 13.4 g/dL (ref 12.0–15.0)
MCH: 29.2 pg (ref 26.0–34.0)
MCHC: 34.5 g/dL (ref 30.0–36.0)
MCV: 84.5 fL (ref 78.0–100.0)
PLATELETS: 205 10*3/uL (ref 150–400)
RBC: 4.59 MIL/uL (ref 3.87–5.11)
RDW: 12.1 % (ref 11.5–15.5)
WBC: 5.6 10*3/uL (ref 4.0–10.5)

## 2015-12-13 MED ORDER — ACETAMINOPHEN 500 MG PO TABS
1000.0000 mg | ORAL_TABLET | Freq: Once | ORAL | Status: AC
  Administered 2015-12-13: 1000 mg via ORAL
  Filled 2015-12-13: qty 2

## 2015-12-13 MED ORDER — SODIUM CHLORIDE 0.9 % IV BOLUS (SEPSIS)
1000.0000 mL | Freq: Once | INTRAVENOUS | Status: AC
  Administered 2015-12-13: 1000 mL via INTRAVENOUS

## 2015-12-13 MED ORDER — NAPROXEN 250 MG PO TABS
500.0000 mg | ORAL_TABLET | Freq: Once | ORAL | Status: AC
  Administered 2015-12-13: 500 mg via ORAL
  Filled 2015-12-13: qty 2

## 2015-12-13 NOTE — Discharge Instructions (Signed)
Concussion, Adult  A concussion, or closed-head injury, is a brain injury caused by a direct blow to the head or by a quick and sudden movement (jolt) of the head or neck. Concussions are usually not life-threatening. Even so, the effects of a concussion can be serious. If you have had a concussion before, you are more likely to experience concussion-like symptoms after a direct blow to the head.   CAUSES  · Direct blow to the head, such as from running into another player during a soccer game, being hit in a fight, or hitting your head on a hard surface.  · A jolt of the head or neck that causes the brain to move back and forth inside the skull, such as in a car crash.  SIGNS AND SYMPTOMS  The signs of a concussion can be hard to notice. Early on, they may be missed by you, family members, and health care providers. You may look fine but act or feel differently.  Symptoms are usually temporary, but they may last for days, weeks, or even longer. Some symptoms may appear right away while others may not show up for hours or days. Every head injury is different. Symptoms include:  · Mild to moderate headaches that will not go away.  · A feeling of pressure inside your head.  · Having more trouble than usual:    Learning or remembering things you have heard.    Answering questions.    Paying attention or concentrating.    Organizing daily tasks.    Making decisions and solving problems.  · Slowness in thinking, acting or reacting, speaking, or reading.  · Getting lost or being easily confused.  · Feeling tired all the time or lacking energy (fatigued).  · Feeling drowsy.  · Sleep disturbances.    Sleeping more than usual.    Sleeping less than usual.    Trouble falling asleep.    Trouble sleeping (insomnia).  · Loss of balance or feeling lightheaded or dizzy.  · Nausea or vomiting.  · Numbness or tingling.  · Increased sensitivity to:    Sounds.    Lights.    Distractions.  · Vision problems or eyes that tire  easily.  · Diminished sense of taste or smell.  · Ringing in the ears.  · Mood changes such as feeling sad or anxious.  · Becoming easily irritated or angry for little or no reason.  · Lack of motivation.  · Seeing or hearing things other people do not see or hear (hallucinations).  DIAGNOSIS  Your health care provider can usually diagnose a concussion based on a description of your injury and symptoms. He or she will ask whether you passed out (lost consciousness) and whether you are having trouble remembering events that happened right before and during your injury.  Your evaluation might include:  · A brain scan to look for signs of injury to the brain. Even if the test shows no injury, you may still have a concussion.  · Blood tests to be sure other problems are not present.  TREATMENT  · Concussions are usually treated in an emergency department, in urgent care, or at a clinic. You may need to stay in the hospital overnight for further treatment.  · Tell your health care provider if you are taking any medicines, including prescription medicines, over-the-counter medicines, and natural remedies. Some medicines, such as blood thinners (anticoagulants) and aspirin, may increase the chance of complications. Also tell your health care   provider whether you have had alcohol or are taking illegal drugs. This information may affect treatment.  · Your health care provider will send you home with important instructions to follow.  · How fast you will recover from a concussion depends on many factors. These factors include how severe your concussion is, what part of your brain was injured, your age, and how healthy you were before the concussion.  · Most people with mild injuries recover fully. Recovery can take time. In general, recovery is slower in older persons. Also, persons who have had a concussion in the past or have other medical problems may find that it takes longer to recover from their current injury.  HOME  CARE INSTRUCTIONS  General Instructions  · Carefully follow the directions your health care provider gave you.  · Only take over-the-counter or prescription medicines for pain, discomfort, or fever as directed by your health care provider.  · Take only those medicines that your health care provider has approved.  · Do not drink alcohol until your health care provider says you are well enough to do so. Alcohol and certain other drugs may slow your recovery and can put you at risk of further injury.  · If it is harder than usual to remember things, write them down.  · If you are easily distracted, try to do one thing at a time. For example, do not try to watch TV while fixing dinner.  · Talk with family members or close friends when making important decisions.  · Keep all follow-up appointments. Repeated evaluation of your symptoms is recommended for your recovery.  · Watch your symptoms and tell others to do the same. Complications sometimes occur after a concussion. Older adults with a brain injury may have a higher risk of serious complications, such as a blood clot on the brain.  · Tell your teachers, school nurse, school counselor, coach, athletic trainer, or work manager about your injury, symptoms, and restrictions. Tell them about what you can or cannot do. They should watch for:    Increased problems with attention or concentration.    Increased difficulty remembering or learning new information.    Increased time needed to complete tasks or assignments.    Increased irritability or decreased ability to cope with stress.    Increased symptoms.  · Rest. Rest helps the brain to heal. Make sure you:    Get plenty of sleep at night. Avoid staying up late at night.    Keep the same bedtime hours on weekends and weekdays.    Rest during the day. Take daytime naps or rest breaks when you feel tired.  · Limit activities that require a lot of thought or concentration. These include:    Doing homework or job-related  work.    Watching TV.    Working on the computer.  · Avoid any situation where there is potential for another head injury (football, hockey, soccer, basketball, martial arts, downhill snow sports and horseback riding). Your condition will get worse every time you experience a concussion. You should avoid these activities until you are evaluated by the appropriate follow-up health care providers.  Returning To Your Regular Activities  You will need to return to your normal activities slowly, not all at once. You must give your body and brain enough time for recovery.  · Do not return to sports or other athletic activities until your health care provider tells you it is safe to do so.  · Ask   your health care provider when you can drive, ride a bicycle, or operate heavy machinery. Your ability to react may be slower after a brain injury. Never do these activities if you are dizzy.  · Ask your health care provider about when you can return to work or school.  Preventing Another Concussion  It is very important to avoid another brain injury, especially before you have recovered. In rare cases, another injury can lead to permanent brain damage, brain swelling, or death. The risk of this is greatest during the first 7-10 days after a head injury. Avoid injuries by:  · Wearing a seat belt when riding in a car.  · Drinking alcohol only in moderation.  · Wearing a helmet when biking, skiing, skateboarding, skating, or doing similar activities.  · Avoiding activities that could lead to a second concussion, such as contact or recreational sports, until your health care provider says it is okay.  · Taking safety measures in your home.    Remove clutter and tripping hazards from floors and stairways.    Use grab bars in bathrooms and handrails by stairs.    Place non-slip mats on floors and in bathtubs.    Improve lighting in dim areas.  SEEK MEDICAL CARE IF:  · You have increased problems paying attention or  concentrating.  · You have increased difficulty remembering or learning new information.  · You need more time to complete tasks or assignments than before.  · You have increased irritability or decreased ability to cope with stress.  · You have more symptoms than before.  Seek medical care if you have any of the following symptoms for more than 2 weeks after your injury:  · Lasting (chronic) headaches.  · Dizziness or balance problems.  · Nausea.  · Vision problems.  · Increased sensitivity to noise or light.  · Depression or mood swings.  · Anxiety or irritability.  · Memory problems.  · Difficulty concentrating or paying attention.  · Sleep problems.  · Feeling tired all the time.  SEEK IMMEDIATE MEDICAL CARE IF:  · You have severe or worsening headaches. These may be a sign of a blood clot in the brain.  · You have weakness (even if only in one hand, leg, or part of the face).  · You have numbness.  · You have decreased coordination.  · You vomit repeatedly.  · You have increased sleepiness.  · One pupil is larger than the other.  · You have convulsions.  · You have slurred speech.  · You have increased confusion. This may be a sign of a blood clot in the brain.  · You have increased restlessness, agitation, or irritability.  · You are unable to recognize people or places.  · You have neck pain.  · It is difficult to wake you up.  · You have unusual behavior changes.  · You lose consciousness.  MAKE SURE YOU:  · Understand these instructions.  · Will watch your condition.  · Will get help right away if you are not doing well or get worse.     This information is not intended to replace advice given to you by your health care provider. Make sure you discuss any questions you have with your health care provider.     Document Released: 08/23/2003 Document Revised: 06/23/2014 Document Reviewed: 12/23/2012  Elsevier Interactive Patient Education ©2016 [employer]
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
## 2015-12-14 NOTE — ED Notes (Signed)
Pt stable, ambulatory, states understanding of discharge instructions 

## 2016-10-03 ENCOUNTER — Encounter (HOSPITAL_COMMUNITY): Payer: Self-pay | Admitting: Emergency Medicine

## 2016-10-03 ENCOUNTER — Inpatient Hospital Stay (HOSPITAL_COMMUNITY)
Admission: EM | Admit: 2016-10-03 | Discharge: 2016-10-09 | DRG: 440 | Disposition: A | Discharge status: Home / Self Care | Payer: Self-pay | Attending: Internal Medicine | Admitting: Internal Medicine

## 2016-10-03 DIAGNOSIS — D649 Anemia, unspecified: Secondary | ICD-10-CM | POA: Diagnosis present

## 2016-10-03 DIAGNOSIS — I480 Paroxysmal atrial fibrillation: Secondary | ICD-10-CM | POA: Diagnosis present

## 2016-10-03 DIAGNOSIS — M109 Gout, unspecified: Secondary | ICD-10-CM | POA: Diagnosis present

## 2016-10-03 DIAGNOSIS — Z6828 Body mass index (BMI) 28.0-28.9, adult: Secondary | ICD-10-CM

## 2016-10-03 DIAGNOSIS — I2510 Atherosclerotic heart disease of native coronary artery without angina pectoris: Secondary | ICD-10-CM | POA: Diagnosis present

## 2016-10-03 DIAGNOSIS — E669 Obesity, unspecified: Secondary | ICD-10-CM | POA: Diagnosis present

## 2016-10-03 DIAGNOSIS — E785 Hyperlipidemia, unspecified: Secondary | ICD-10-CM | POA: Diagnosis present

## 2016-10-03 DIAGNOSIS — R739 Hyperglycemia, unspecified: Secondary | ICD-10-CM

## 2016-10-03 DIAGNOSIS — E1169 Type 2 diabetes mellitus with other specified complication: Secondary | ICD-10-CM | POA: Diagnosis present

## 2016-10-03 DIAGNOSIS — I083 Combined rheumatic disorders of mitral, aortic and tricuspid valves: Secondary | ICD-10-CM | POA: Diagnosis present

## 2016-10-03 DIAGNOSIS — Z79899 Other long term (current) drug therapy: Secondary | ICD-10-CM

## 2016-10-03 DIAGNOSIS — K859 Acute pancreatitis without necrosis or infection, unspecified: Principal | ICD-10-CM | POA: Diagnosis present

## 2016-10-03 DIAGNOSIS — R000 Tachycardia, unspecified: Secondary | ICD-10-CM | POA: Diagnosis present

## 2016-10-03 DIAGNOSIS — Z9049 Acquired absence of other specified parts of digestive tract: Secondary | ICD-10-CM

## 2016-10-03 DIAGNOSIS — E1165 Type 2 diabetes mellitus with hyperglycemia: Secondary | ICD-10-CM | POA: Diagnosis present

## 2016-10-03 DIAGNOSIS — E876 Hypokalemia: Secondary | ICD-10-CM | POA: Diagnosis present

## 2016-10-03 DIAGNOSIS — E781 Pure hyperglyceridemia: Secondary | ICD-10-CM | POA: Diagnosis present

## 2016-10-03 DIAGNOSIS — Z794 Long term (current) use of insulin: Secondary | ICD-10-CM

## 2016-10-03 DIAGNOSIS — I10 Essential (primary) hypertension: Secondary | ICD-10-CM | POA: Diagnosis present

## 2016-10-03 DIAGNOSIS — Z833 Family history of diabetes mellitus: Secondary | ICD-10-CM

## 2016-10-03 DIAGNOSIS — I493 Ventricular premature depolarization: Secondary | ICD-10-CM | POA: Diagnosis not present

## 2016-10-03 DIAGNOSIS — M25521 Pain in right elbow: Secondary | ICD-10-CM | POA: Diagnosis present

## 2016-10-03 LAB — CBC
HEMATOCRIT: 36.1 % (ref 36.0–46.0)
HEMOGLOBIN: 12.9 g/dL (ref 12.0–15.0)
MCH: 29.1 pg (ref 26.0–34.0)
MCHC: 35.7 g/dL (ref 30.0–36.0)
MCV: 81.5 fL (ref 78.0–100.0)
Platelets: 206 10*3/uL (ref 150–400)
RBC: 4.43 MIL/uL (ref 3.87–5.11)
RDW: 12.4 % (ref 11.5–15.5)
WBC: 12.1 10*3/uL — AB (ref 4.0–10.5)

## 2016-10-03 LAB — BASIC METABOLIC PANEL
Anion gap: 16 — ABNORMAL HIGH (ref 5–15)
BUN: 20 mg/dL (ref 6–20)
CO2: 22 mmol/L (ref 22–32)
Calcium: 9.9 mg/dL (ref 8.9–10.3)
Chloride: 90 mmol/L — ABNORMAL LOW (ref 101–111)
Creatinine, Ser: 0.63 mg/dL (ref 0.44–1.00)
GFR calc Af Amer: >60 mL/min (ref >60)
Glucose, Bld: 423 mg/dL — ABNORMAL HIGH (ref 65–99)
Potassium: 3.5 mmol/L (ref 3.5–5.1)
Sodium: 128 mmol/L — ABNORMAL LOW (ref 135–145)

## 2016-10-03 LAB — I-STAT TROPONIN, ED: Troponin i, poc: 0.03 ng/mL (ref 0.00–0.08)

## 2016-10-03 MED ORDER — HYDROMORPHONE HCL 1 MG/ML IJ SOLN
0.5000 mg | Freq: Once | INTRAMUSCULAR | Status: AC
  Administered 2016-10-04: 0.5 mg via INTRAVENOUS
  Filled 2016-10-03: qty 1

## 2016-10-03 MED ORDER — IOPAMIDOL (ISOVUE-300) INJECTION 61%
INTRAVENOUS | Status: AC
  Administered 2016-10-04: 100 mL
  Filled 2016-10-03: qty 100

## 2016-10-03 MED ORDER — SODIUM CHLORIDE 0.9 % IV BOLUS (SEPSIS)
1000.0000 mL | Freq: Once | INTRAVENOUS | Status: AC
  Administered 2016-10-04: 1000 mL via INTRAVENOUS

## 2016-10-03 MED ORDER — ONDANSETRON HCL 4 MG/2ML IJ SOLN
4.0000 mg | Freq: Once | INTRAMUSCULAR | Status: AC
  Administered 2016-10-04: 4 mg via INTRAVENOUS
  Filled 2016-10-03: qty 2

## 2016-10-03 MED ORDER — INSULIN ASPART 100 UNIT/ML ~~LOC~~ SOLN
10.0000 [IU] | Freq: Once | SUBCUTANEOUS | Status: AC
  Administered 2016-10-04: 10 [IU] via INTRAVENOUS
  Filled 2016-10-03: qty 1

## 2016-10-03 NOTE — ED Triage Notes (Signed)
Patient arrives with complaint of sharp abdominal pain. States initially occurred yesterday but it was mild and it went away. Today patient was working, states she lifted a trashcan at work and thought that may have caused it. At the time of onset she was sweeping and it suddenly started and feels "like something is pinching me really hard. Currently pain is RUQ and RLQ. Denies NVD,

## 2016-10-03 NOTE — ED Provider Notes (Signed)
[facility]-EMERGENCY DEPT Provider Note   CSN: [id number] Arrival date & time: 10/03/16  2209   By signing my name below, I, Clovis Pu, attest that this documentation has been prepared under the direction and in the presence of Gilda Crease, MD  Electronically Signed: Clovis Pu, ED Scribe. 10/03/16. 11:45 PM.   History   Chief Complaint Chief Complaint  Patient presents with  . Tachycardia  . Abdominal Pain    HPI Comments:  Emily Farmer is a 61 y.o. female, with a PMHx of hyperlipidemia, DM and HTN and PSHx of cholecystectomy, who presents to the Emergency Department complaining of acute onset, intermittent, "10/10" abdominal pain beginning today. She notes she lifted something heavy at work and notes her pain began later after this incident. Her pain is worse upon palpation. No alleviating factors noted. Pt denies any other associated symptoms. No other complaints noted at this time.     The history is provided by the patient. No language interpreter was used.    Past Medical History:  Diagnosis Date  . Arthritis   . Diabetes mellitus   . Gout   . Hyperlipidemia   . Hypertension     Patient Active Problem List   Diagnosis Date Noted  . Cholecystitis, acute with cholelithiasis 01/04/2014  . Hypokalemia 01/04/2014  . Pyelonephritis 02/15/2013  . Dehydration 02/15/2013  . Hyperglycemia 02/15/2013  . Increased anion gap metabolic acidosis 02/15/2013  . HTN (hypertension) 02/15/2013  . HLD (hyperlipidemia) 02/15/2013  . Diabetes mellitus type 2 in obese (HCC) 02/15/2013  . Coronary atherosclerosis seen on CT 02/15/2013  . Nausea and vomiting 02/15/2013  . Diarrhea 02/15/2013    Past Surgical History:  Procedure Laterality Date  . CHOLECYSTECTOMY N/A 01/04/2014   Procedure: LAPAROSCOPIC CHOLECYSTECTOMY WITH INTRAOPERATIVE CHOLANGIOGRAM;  Surgeon: Axel Filler, MD;  Location: [facility] OR;  Service: General;  Laterality: N/A;  . LUNG SURGERY      OB History      No data available       Home Medications    Prior to Admission medications   Medication Sig Start Date End Date Taking? Authorizing Provider  atorvastatin (LIPITOR) 10 MG tablet Take 10 mg by mouth daily.   Yes Historical Provider, MD  insulin glargine (LANTUS) 100 UNIT/ML injection Inject 6 Units into the skin at bedtime.   Yes Historical Provider, MD  metFORMIN (GLUCOPHAGE) 500 MG tablet Take 500 mg by mouth 2 (two) times daily with a meal.   Yes Historical Provider, MD    Family History Family History  Problem Relation Age of Onset  . Diabetes Sister   . High blood pressure Neg Hx   . High Cholesterol Neg Hx   . Heart attack Neg Hx     Social History Social History  Substance Use Topics  . Smoking status: Never Smoker  . Smokeless tobacco: Never Used  . Alcohol use No     Allergies   Patient has no known allergies.   Review of Systems Review of Systems  Constitutional: Negative for fever.  Gastrointestinal: Positive for abdominal pain. Negative for nausea and vomiting.  All other systems reviewed and are negative.    Physical Exam Updated Vital Signs BP 134/75   Pulse 96   Temp 97.7 F (36.5 C) (Oral)   Resp (!) 26   SpO2 96%   Physical Exam  Constitutional: She is oriented to person, place, and time. She appears well-developed and well-nourished. No distress.  HENT:  Head: Normocephalic and atraumatic.  Right Ear: Hearing normal.  Left Ear: Hearing normal.  Nose: Nose normal.  Mouth/Throat: Oropharynx is clear and moist and mucous membranes are normal.  Eyes: Conjunctivae and EOM are normal. Pupils are equal, round, and reactive to light.  Neck: Normal range of motion. Neck supple.  Cardiovascular: Regular rhythm, S1 normal and S2 normal.  Exam reveals no gallop and no friction rub.   No murmur heard. Pulmonary/Chest: Effort normal and breath sounds normal. No respiratory distress. She exhibits no tenderness.  Abdominal: Soft. Normal appearance  and bowel sounds are normal. There is no hepatosplenomegaly. There is tenderness in the epigastric area and suprapubic area. There is no rebound, no guarding, no tenderness at McBurney's point and negative Murphy's sign. No hernia.  Right upper epigastric tenderness. Left upper suprapubic tenderness without guarding or rebound.   Musculoskeletal: Normal range of motion.  Neurological: She is alert and oriented to person, place, and time. She has normal strength. No cranial nerve deficit or sensory deficit. Coordination normal. GCS eye subscore is 4. GCS verbal subscore is 5. GCS motor subscore is 6.  Skin: Skin is warm, dry and intact. No rash noted. No cyanosis.  Psychiatric: She has a normal mood and affect. Her speech is normal and behavior is normal. Thought content normal.  Nursing note and vitals reviewed.    ED Treatments / Results  DIAGNOSTIC STUDIES:  Oxygen Saturation is 95% on RA, normal by my interpretation.    COORDINATION OF CARE:  11:40 PM Discussed treatment plan with pt at bedside and pt agreed to plan.  Labs (all labs ordered are listed, but only abnormal results are displayed) Labs Reviewed  BASIC METABOLIC PANEL - Abnormal; Notable for the following:       Result Value   Sodium 128 (*)    Chloride 90 (*)    Glucose, Bld 423 (*)    Anion gap 16 (*)    All other components within normal limits  CBC - Abnormal; Notable for the following:    WBC 12.1 (*)    All other components within normal limits  URINALYSIS, ROUTINE W REFLEX MICROSCOPIC - Abnormal; Notable for the following:    Specific Gravity, Urine 1.031 (*)    Glucose, UA >=500 (*)    Hgb urine dipstick SMALL (*)    Ketones, ur 5 (*)    Squamous Epithelial / LPF 0-5 (*)    All other components within normal limits  HEPATIC FUNCTION PANEL - Abnormal; Notable for the following:    Bilirubin, Direct <0.1 (*)    All other components within normal limits  LIPASE, BLOOD - Abnormal; Notable for the following:     Lipase 335 (*)    All other components within normal limits  CBG MONITORING, ED - Abnormal; Notable for the following:    Glucose-Capillary 307 (*)    All other components within normal limits  I-STAT TROPOININ, ED    EKG  EKG Interpretation  Date/Time:  Friday October 03 2016 22:34:11 EDT Ventricular Rate:  143 PR Interval:    QRS Duration: 72 QT Interval:  306 QTC Calculation: 472 R Axis:   20 Text Interpretation:  Atrial fibrillation with rapid ventricular response Low voltage QRS Nonspecific T wave abnormality  AFIB with RVR new compared to previous tracing Confirmed by Quantez Schnyder  MD, Alawna Graybeal [phone]) on 10/03/2016 11:43:29 PM       Radiology Ct Abdomen Pelvis W Contrast  Result Date: 10/04/2016 CLINICAL DATA:  Right-sided and lower abdominal pain with vomiting  for a few hours ago. History of cholecystectomy. EXAM: CT ABDOMEN AND PELVIS WITH CONTRAST TECHNIQUE: Multidetector CT imaging of the abdomen and pelvis was performed using the standard protocol following bolus administration of intravenous contrast. CONTRAST:  100 ml  ISOVUE-300 IOPAMIDOL (ISOVUE-300) INJECTION 61% COMPARISON:  01/04/2014 FINDINGS: Lower chest: Linear scarring in the right lung base, possibly associated with surgical change. Atelectasis in the lung bases. Hepatobiliary: Diffuse fatty infiltration of the liver. No focal liver lesions identified. Gallbladder is surgically absent. Bile ducts are not dilated. Pancreas: There is fluid around the head of the pancreas extending into the gallbladder fossa and along the right anterior pararenal space. This appearance is suspicious for focal pancreatitis. No loculated collections. No evidence of pancreatic necrosis or pancreatic duct dilatation. Spleen: Normal in size without focal abnormality. Adrenals/Urinary Tract: Adrenal glands are unremarkable. Kidneys are normal, without renal calculi, focal lesion, or hydronephrosis. Bladder is unremarkable. Stomach/Bowel:  Stomach, small bowel, and colon are not abnormally distended. No wall thickening is appreciated. Scattered diverticula in the colon without evidence of diverticulitis. There is not infraumbilical ventral abdominal wall hernia containing fat and transverse colon. No proximal obstruction. The hernia is new since previous study. Appendix is normal. Vascular/Lymphatic: Aortic atherosclerosis. No enlarged abdominal or pelvic lymph nodes. Reproductive: Uterus and bilateral adnexa are unremarkable. Other: Rounded calcification with central fat density in the left anterior pelvis. No change since prior study. This likely represents calcified fat necrosis. No free air in the abdomen. Musculoskeletal: Degenerative changes in the spine. No destructive bone lesions. IMPRESSION: Edema around the head of the pancreas with extension into the gallbladder fossa and right anterior pararenal space. Changes are likely to represent focal pancreatitis. No loculated collection or pancreatic necrosis. Anterior abdominal wall hernia containing transverse colon. No obstruction. Diffuse fatty infiltration of the liver. Electronically Signed   By: Burman Nieves M.D.   On: 10/04/2016 01:46    Procedures Procedures (including critical care time)  Medications Ordered in ED Medications  HYDROmorphone (DILAUDID) injection 0.5 mg (not administered)  sodium chloride 0.9 % bolus 1,000 mL (0 mLs Intravenous Stopped 10/04/16 0340)  insulin aspart (novoLOG) injection 10 Units (10 Units Intravenous Given 10/04/16 0109)  HYDROmorphone (DILAUDID) injection 0.5 mg (0.5 mg Intravenous Given 10/04/16 0107)  ondansetron (ZOFRAN) injection 4 mg (4 mg Intravenous Given 10/04/16 0106)  iopamidol (ISOVUE-300) 61 % injection (100 mLs  Contrast Given 10/04/16 0120)     Initial Impression / Assessment and Plan / ED Course  I have reviewed the triage vital signs and the nursing notes.  Pertinent labs & imaging results that were available during my  care of the patient were reviewed by me and considered in my medical decision making (see chart for details).     Patient presents to the emergency part for evaluation of abdominal pain. Patient has been having pain for at least one day. She reports upper abdominal pain, predominantly left-sided. This is worse with touching the area and lifting heavy objects. No associated vomiting or diarrhea. She has not noticed any fever. Workup reveals pancreatitis. She has required multiple doses of analgesia, will be hospitalized.  Did have a chads score of 2, but anticoagulation was held secondary to acute pancreatitis.   CHADS-VASc Score for Atrial Fibrillation Stroke Risk from [email or web address]  on 10/04/2016 ** All calculations should be rechecked by clinician prior to use **  RESULT SUMMARY: 2 points Stroke risk was 2.2% per year in >90,000 patients (the [place] Atrial Fibrillation Cohort Study) and 2.9% risk  of stroke/TIA/systemic embolism.  One recommendation suggests a 0 score is "low" risk and may not require anticoagulation; a 1 score is "low-moderate" risk and should consider antiplatelet or anticoagulation, and score 2 or greater is "moderate-high" risk and should otherwise be an anticoagulation candidate.   INPUTS: Age -> 0 = <65 Sex -> 1 = Female <abbr title='Congestive heart failure'>CHF</abbr> history -> 0 = No Hypertension history -> 1 = Yes Stroke / TIA / Thromboembolism history -> 0 = No Vascular disease history -> 0 = No Diabetes history -> 0 = No   Final Clinical Impressions(s) / ED Diagnoses   Final diagnoses:  Paroxysmal A-fib (HCC)  Acute pancreatitis without infection or necrosis, unspecified pancreatitis type  Hyperglycemia    New Prescriptions New Prescriptions   No medications on file  I personally performed the services described in this documentation, which was scribed in my presence. The recorded information has been reviewed and is accurate.      Gilda Crease, MD 10/05/16 [phone]

## 2016-10-04 ENCOUNTER — Encounter (HOSPITAL_COMMUNITY): Payer: Self-pay | Admitting: Radiology

## 2016-10-04 ENCOUNTER — Inpatient Hospital Stay (HOSPITAL_COMMUNITY): Payer: Self-pay

## 2016-10-04 ENCOUNTER — Emergency Department (HOSPITAL_COMMUNITY): Payer: Self-pay

## 2016-10-04 DIAGNOSIS — K859 Acute pancreatitis without necrosis or infection, unspecified: Principal | ICD-10-CM | POA: Diagnosis present

## 2016-10-04 DIAGNOSIS — I4891 Unspecified atrial fibrillation: Secondary | ICD-10-CM

## 2016-10-04 DIAGNOSIS — E1169 Type 2 diabetes mellitus with other specified complication: Secondary | ICD-10-CM

## 2016-10-04 DIAGNOSIS — I480 Paroxysmal atrial fibrillation: Secondary | ICD-10-CM | POA: Diagnosis present

## 2016-10-04 DIAGNOSIS — I10 Essential (primary) hypertension: Secondary | ICD-10-CM

## 2016-10-04 DIAGNOSIS — E669 Obesity, unspecified: Secondary | ICD-10-CM

## 2016-10-04 LAB — ECHOCARDIOGRAM COMPLETE
CHL CUP MV DEC (S): 208
CHL CUP RV SYS PRESS: 30 mmHg
E decel time: 208 msec
E/e' ratio: 12.76
FS: 39 % (ref 28–44)
HEIGHTINCHES: 63 in
IV/PV OW: 0.75
LA diam end sys: 42 mm
LA diam index: 2.37 cm/m2
LA vol A4C: 26.4 ml
LA vol: 33.9 mL
LASIZE: 42 mm
LAVOLIN: 19.2 mL/m2
LDCA: 2.54 cm2
LV E/e'average: 12.76
LV PW d: 10.7 mm — AB (ref 0.6–1.1)
LV TDI E'LATERAL: 6.96
LV TDI E'MEDIAL: 6.42
LV e' LATERAL: 6.96 cm/s
LVEEMED: 12.76
LVOT diameter: 18 mm
Lateral S' vel: 13.7 cm/s
MV pk A vel: 82.9 m/s
MVAP: 1.73 cm2
MVPG: 3 mmHg
MVPKEVEL: 88.8 m/s
P 1/2 time: 61 ms
RV TAPSE: 16.5 mm
Reg peak vel: 259 cm/s
TR max vel: 259 cm/s
WEIGHTICAEL: 2611.2 [oz_av]

## 2016-10-04 LAB — URINALYSIS, ROUTINE W REFLEX MICROSCOPIC
BACTERIA UA: NONE SEEN
BACTERIA UA: NONE SEEN
BILIRUBIN URINE: NEGATIVE
Bilirubin Urine: NEGATIVE
Glucose, UA: >=500 mg/dL — AB
Glucose, UA: >=500 mg/dL — AB
KETONES UR: 5 mg/dL — AB
Ketones, ur: 80 mg/dL — AB
LEUKOCYTES UA: NEGATIVE
Leukocytes, UA: NEGATIVE
NITRITE: NEGATIVE
Nitrite: NEGATIVE
PH: 5 (ref 5.0–8.0)
PROTEIN: NEGATIVE mg/dL
PROTEIN: NEGATIVE mg/dL
SPECIFIC GRAVITY, URINE: 1.04 — AB (ref 1.005–1.030)
SQUAMOUS EPITHELIAL / LPF: NONE SEEN
Specific Gravity, Urine: 1.031 — ABNORMAL HIGH (ref 1.005–1.030)
pH: 5 (ref 5.0–8.0)

## 2016-10-04 LAB — BASIC METABOLIC PANEL
Anion gap: 14 (ref 5–15)
Anion gap: 10 (ref 5–15)
BUN: 10 mg/dL (ref 6–20)
BUN: 8 mg/dL (ref 6–20)
CHLORIDE: 95 mmol/L — AB (ref 101–111)
CHLORIDE: 99 mmol/L — AB (ref 101–111)
CO2: 24 mmol/L (ref 22–32)
CO2: 24 mmol/L (ref 22–32)
CREATININE: 0.55 mg/dL (ref 0.44–1.00)
CREATININE: 0.48 mg/dL (ref 0.44–1.00)
Calcium: 8.6 mg/dL — ABNORMAL LOW (ref 8.9–10.3)
Calcium: 8.1 mg/dL — ABNORMAL LOW (ref 8.9–10.3)
GFR calc Af Amer: >60 mL/min (ref >60)
GFR calc Af Amer: >60 mL/min (ref >60)
GFR calc non Af Amer: >60 mL/min (ref >60)
GFR calc non Af Amer: >60 mL/min (ref >60)
GLUCOSE: 337 mg/dL — AB (ref 65–99)
GLUCOSE: 302 mg/dL — AB (ref 65–99)
POTASSIUM: 3.3 mmol/L — AB (ref 3.5–5.1)
POTASSIUM: 3.3 mmol/L — AB (ref 3.5–5.1)
Sodium: 133 mmol/L — ABNORMAL LOW (ref 135–145)
Sodium: 133 mmol/L — ABNORMAL LOW (ref 135–145)

## 2016-10-04 LAB — HEPATIC FUNCTION PANEL
ALBUMIN: 3.6 g/dL (ref 3.5–5.0)
ALT: 24 U/L (ref 14–54)
AST: 31 U/L (ref 15–41)
Alkaline Phosphatase: 63 U/L (ref 38–126)
BILIRUBIN TOTAL: 0.8 mg/dL (ref 0.3–1.2)
Bilirubin, Direct: <0.1 mg/dL — ABNORMAL LOW (ref 0.1–0.5)
TOTAL PROTEIN: 6.7 g/dL (ref 6.5–8.1)

## 2016-10-04 LAB — GLUCOSE, CAPILLARY
GLUCOSE-CAPILLARY: 282 mg/dL — AB (ref 65–99)
Glucose-Capillary: 211 mg/dL — ABNORMAL HIGH (ref 65–99)
Glucose-Capillary: 212 mg/dL — ABNORMAL HIGH (ref 65–99)
Glucose-Capillary: 216 mg/dL — ABNORMAL HIGH (ref 65–99)

## 2016-10-04 LAB — LIPASE, BLOOD: LIPASE: 335 U/L — AB (ref 11–51)

## 2016-10-04 LAB — CBG MONITORING, ED: GLUCOSE-CAPILLARY: 307 mg/dL — AB (ref 65–99)

## 2016-10-04 LAB — HIV ANTIBODY (ROUTINE TESTING W REFLEX): HIV SCREEN 4TH GENERATION: NONREACTIVE

## 2016-10-04 MED ORDER — INSULIN GLARGINE 100 UNIT/ML ~~LOC~~ SOLN
6.0000 [IU] | Freq: Every day | SUBCUTANEOUS | Status: DC
  Administered 2016-10-04: 6 [IU] via SUBCUTANEOUS
  Filled 2016-10-04: qty 0.06

## 2016-10-04 MED ORDER — HYDROCODONE-ACETAMINOPHEN 5-325 MG PO TABS
1.0000 | ORAL_TABLET | ORAL | Status: DC | PRN
  Administered 2016-10-05 (×2): 1 via ORAL
  Administered 2016-10-06: 2 via ORAL
  Administered 2016-10-07 (×2): 1 via ORAL
  Administered 2016-10-08: 2 via ORAL
  Administered 2016-10-08: 1 via ORAL
  Administered 2016-10-09: 2 via ORAL
  Filled 2016-10-04: qty 2
  Filled 2016-10-04 (×4): qty 1
  Filled 2016-10-04 (×2): qty 2
  Filled 2016-10-04: qty 1

## 2016-10-04 MED ORDER — ACETAMINOPHEN 650 MG RE SUPP: 650.0000 mg | Freq: Four times a day (QID) | RECTAL | Status: DC | PRN

## 2016-10-04 MED ORDER — FENTANYL CITRATE (PF) 100 MCG/2ML IJ SOLN: 25.0000 ug | INTRAMUSCULAR | Status: DC | PRN

## 2016-10-04 MED ORDER — SENNOSIDES-DOCUSATE SODIUM 8.6-50 MG PO TABS: 1.0000 | ORAL_TABLET | Freq: Every evening | ORAL | Status: DC | PRN

## 2016-10-04 MED ORDER — BISACODYL 10 MG RE SUPP
10.0000 mg | Freq: Every day | RECTAL | Status: DC | PRN
Start: 2016-10-04 — End: 2016-10-09

## 2016-10-04 MED ORDER — HYDROMORPHONE HCL 1 MG/ML IJ SOLN
0.5000 mg | Freq: Once | INTRAMUSCULAR | Status: AC
  Administered 2016-10-04: 0.5 mg via INTRAVENOUS
  Filled 2016-10-04: qty 1

## 2016-10-04 MED ORDER — HYDROMORPHONE HCL 1 MG/ML IJ SOLN
0.5000 mg | INTRAMUSCULAR | Status: DC | PRN
  Administered 2016-10-04 – 2016-10-06 (×4): 0.5 mg via INTRAVENOUS
  Filled 2016-10-04 (×4): qty 1

## 2016-10-04 MED ORDER — ENOXAPARIN SODIUM 40 MG/0.4ML ~~LOC~~ SOLN: 40.0000 mg | SUBCUTANEOUS | Status: DC

## 2016-10-04 MED ORDER — INSULIN ASPART 100 UNIT/ML ~~LOC~~ SOLN
0.0000 [IU] | Freq: Three times a day (TID) | SUBCUTANEOUS | Status: DC
  Administered 2016-10-04: 5 [IU] via SUBCUTANEOUS
  Administered 2016-10-04 – 2016-10-05 (×3): 3 [IU] via SUBCUTANEOUS
  Administered 2016-10-05 – 2016-10-07 (×5): 2 [IU] via SUBCUTANEOUS
  Administered 2016-10-07: 3 [IU] via SUBCUTANEOUS

## 2016-10-04 MED ORDER — TRAZODONE HCL 50 MG PO TABS
25.0000 mg | ORAL_TABLET | Freq: Every evening | ORAL | Status: DC | PRN
  Administered 2016-10-06 – 2016-10-07 (×2): 25 mg via ORAL
  Filled 2016-10-04 (×2): qty 1

## 2016-10-04 MED ORDER — ONDANSETRON HCL 4 MG/2ML IJ SOLN: 4.0000 mg | Freq: Four times a day (QID) | INTRAMUSCULAR | Status: DC | PRN

## 2016-10-04 MED ORDER — MAGNESIUM CITRATE PO SOLN: 1.0000 | Freq: Once | ORAL | Status: DC | PRN

## 2016-10-04 MED ORDER — ONDANSETRON HCL 4 MG PO TABS: 4.0000 mg | ORAL_TABLET | Freq: Four times a day (QID) | ORAL | Status: DC | PRN

## 2016-10-04 MED ORDER — SODIUM CHLORIDE 0.9 % IV SOLN
INTRAVENOUS | Status: DC
  Administered 2016-10-04 – 2016-10-06 (×5): via INTRAVENOUS

## 2016-10-04 MED ORDER — ATORVASTATIN CALCIUM 10 MG PO TABS
10.0000 mg | ORAL_TABLET | Freq: Every day | ORAL | Status: DC
  Administered 2016-10-04 – 2016-10-05 (×2): 10 mg via ORAL
  Filled 2016-10-04 (×2): qty 1

## 2016-10-04 MED ORDER — ACETAMINOPHEN 325 MG PO TABS
650.0000 mg | ORAL_TABLET | Freq: Four times a day (QID) | ORAL | Status: DC | PRN
  Administered 2016-10-06 – 2016-10-08 (×3): 650 mg via ORAL
  Filled 2016-10-04 (×3): qty 2

## 2016-10-04 MED ORDER — SODIUM CHLORIDE 0.9 % IV SOLN
INTRAVENOUS | Status: AC
  Administered 2016-10-04: 06:00:00 via INTRAVENOUS

## 2016-10-04 MED ORDER — ENOXAPARIN SODIUM 40 MG/0.4ML ~~LOC~~ SOLN
40.0000 mg | SUBCUTANEOUS | Status: DC
  Administered 2016-10-04: 40 mg via SUBCUTANEOUS
  Filled 2016-10-04: qty 0.4

## 2016-10-04 NOTE — Progress Notes (Signed)
*  PRELIMINARY RESULTS* Echocardiogram 2D Echocardiogram has been performed.  Jeryl Columbia 10/04/2016, 4:35 PM

## 2016-10-04 NOTE — ED Notes (Signed)
Patient transported to CT 

## 2016-10-04 NOTE — ED Notes (Signed)
Pt placed on 2L of O2. SpO2 dropping to high 80s.

## 2016-10-04 NOTE — ED Notes (Signed)
Admitting at bedside 

## 2016-10-04 NOTE — H&P (Signed)
History and Physical    Emily Farmer VXY:[id number] DOB: Nov 09, 1955 DOA: 10/03/2016   PCP: Philis Fendt, MD   Patient coming from:  Home    Chief Complaint: Abdominal pain   HPI: Emily Farmer is a 61 y.o. female with medical history significant for DM, Gout, Hyperlipidemia, HTN, prior cholecystectomy presenting to the ED with acute onset, severe epigastric abdominal pain since late last evening. SHe does report lifting a heavy object at work and initially she thought this was musculoskeletal, as her symptoms began thereafter.  Pain is reproducible. She is also complaining of RUQ and RLQ when moving. No priorhistory of pancreatitis, GERD, history of liver cirrhosis, or alcohol abuse. denies bloody stools. Denies chest pain, cough, shortness of breath, diarrhea or fatigue. Deniesdecreased appetite or  weight loss. No hematuria or darker urine. Denies Presyncope or syncope. No recent abdominal surgery. No recent infections or antibiotics. No recent travels.      ED Course:  BP 97/61 (BP Location: Left Arm)   Pulse 93   Temp 98.3 F (36.8 C) (Oral)   Resp 18   Ht '5\' 3"'  (1.6 m)   SpO2 92%   Lipase 335, AST 31, ALT 24, Alk phos normal 63    Na 128 (average 132) ,  Cr 0.63 Glu 423 Anion gap 16 WBC 12.1, Hb 12.9, PLt 206 UA neg for nitrites or WBC T bil less than 0.1  EKG  Afib with RVR new from prior tracing Tn 0.03    CT abdomen and pelvis w contrast  Shows Edema around the head of the pancreas with extension into the gallbladder fossa and right anterior pararenal space. Changes are likely to represent focal pancreatitis. No loculated collection orpancreatic necrosis. Anterior abdominal wall hernia containing transverse colon. No obstruction. Diffuse fatty infiltration of the liver.  Review of Systems: As per HPI otherwise 10 point review of systems negative.   Past Medical History:  Diagnosis Date  . Arthritis   . Diabetes mellitus   . Gout   . Hyperlipidemia   .  Hypertension     Past Surgical History:  Procedure Laterality Date  . CHOLECYSTECTOMY N/A 01/04/2014   Procedure: LAPAROSCOPIC CHOLECYSTECTOMY WITH INTRAOPERATIVE CHOLANGIOGRAM;  Surgeon: Ralene Ok, MD;  Location: [place];  Service: General;  Laterality: N/A;  . LUNG SURGERY      Social History Social History   Social History  . Marital status: Single    Spouse name: N/A  . Number of children: N/A  . Years of education: N/A   Occupational History  . Not on file.   Social History Main Topics  . Smoking status: Never Smoker  . Smokeless tobacco: Never Used  . Alcohol use No  . Drug use: No  . Sexual activity: Not on file   Other Topics Concern  . Not on file   Social History Narrative   Lives with her sister and does not need any assistance with ADLs.       No Known Allergies  Family History  Problem Relation Age of Onset  . Diabetes Sister   . High blood pressure Neg Hx   . High Cholesterol Neg Hx   . Heart attack Neg Hx       Prior to Admission medications   Medication Sig Start Date End Date Taking? Authorizing Provider  atorvastatin (LIPITOR) 10 MG tablet Take 10 mg by mouth daily.   Yes Historical Provider, MD  insulin glargine (LANTUS) 100 UNIT/ML injection Inject 6 Units into  the skin at bedtime.   Yes Historical Provider, MD  metFORMIN (GLUCOPHAGE) 500 MG tablet Take 500 mg by mouth 2 (two) times daily with a meal.   Yes Historical Provider, MD    Physical Exam:  Vitals:   10/04/16 0600 10/04/16 0645 10/04/16 0730 10/04/16 0810  BP: 129/73 134/67 120/67 97/61  Pulse: 92 89 88 93  Resp: '20 19 17 18  ' Temp:    98.3 F (36.8 C)  TempSrc:    Oral  SpO2: 95% 98% 97% 92%  Height:    '5\' 3"'  (1.6 m)   Constitutional: NAD, calm, uncomfortable due to abdominal pain  Eyes: PERRL, lids and conjunctivae normal ENMT: Mucous membranes are moist, without exudate or lesions Neck: normal, supple, no masses, no thyromegaly Respiratory: clear to auscultation  bilaterally, no wheezing, no crackles. Normal respiratory effort  Cardiovascular regular rate and rhythm, no murmurs, rubs or gallops. No extremity edema. 2+ pedal pulses. No carotid bruits.  Abdomen: Soft, tender to palpation at the epigastrium and RUQ, RLQ, No hepatosplenomegaly. No masses palpable Bowel sounds positive.  Musculoskeletal: no clubbing / cyanosis. Moves all extremities Skin: no jaundice, No lesions.  Neurologic: Sensation intact  Strength equal in all extremities Psychiatric:   Alert and oriented x 3. Normal mood.     Labs on Admission: I have personally reviewed following labs and imaging studies  CBC:  Recent Labs Lab 10/03/16 2240  WBC 12.1*  HGB 12.9  HCT 36.1  MCV 81.5  PLT 793    Basic Metabolic Panel:  Recent Labs Lab 10/03/16 2240  NA 128*  K 3.5  CL 90*  CO2 22  GLUCOSE 423*  BUN 20  CREATININE 0.63  CALCIUM 9.9    GFR: CrCl cannot be calculated (Unknown ideal weight.).  Liver Function Tests:  Recent Labs Lab 10/04/16 0216  AST 31  ALT 24  ALKPHOS 63  BILITOT 0.8  PROT 6.7  ALBUMIN 3.6    Recent Labs Lab 10/04/16 0216  LIPASE 335*   No results for input(s): AMMONIA in the last 168 hours.  Coagulation Profile: No results for input(s): INR, PROTIME in the last 168 hours.  Cardiac Enzymes: No results for input(s): CKTOTAL, CKMB, CKMBINDEX, TROPONINI in the last 168 hours.  BNP (last 3 results) No results for input(s): PROBNP in the last 8760 hours.  HbA1C: No results for input(s): HGBA1C in the last 72 hours.  CBG:  Recent Labs Lab 10/04/16 0339  GLUCAP 307*    Lipid Profile: No results for input(s): CHOL, HDL, LDLCALC, TRIG, CHOLHDL, LDLDIRECT in the last 72 hours.  Thyroid Function Tests: No results for input(s): TSH, T4TOTAL, FREET4, T3FREE, THYROIDAB in the last 72 hours.  Anemia Panel: No results for input(s): VITAMINB12, FOLATE, FERRITIN, TIBC, IRON, RETICCTPCT in the last 72 hours.  Urine  analysis:    Component Value Date/Time   COLORURINE YELLOW 10/03/2016 2237   APPEARANCEUR CLEAR 10/03/2016 2237   LABSPEC 1.031 (H) 10/03/2016 2237   PHURINE 5.0 10/03/2016 2237   GLUCOSEU >=500 (A) 10/03/2016 2237   HGBUR SMALL (A) 10/03/2016 2237   BILIRUBINUR NEGATIVE 10/03/2016 2237   KETONESUR 5 (A) 10/03/2016 2237   PROTEINUR NEGATIVE 10/03/2016 2237   UROBILINOGEN 0.2 01/03/2014 2001   NITRITE NEGATIVE 10/03/2016 2237   LEUKOCYTESUR NEGATIVE 10/03/2016 2237    Sepsis Labs: '@LABRCNTIP' (procalcitonin:4,lacticidven:4) )No results found for this or any previous visit (from the past 240 hour(s)).   Radiological Exams on Admission: Ct Abdomen Pelvis W Contrast  Result Date:  10/04/2016 CLINICAL DATA:  Right-sided and lower abdominal pain with vomiting for a few hours ago. History of cholecystectomy. EXAM: CT ABDOMEN AND PELVIS WITH CONTRAST TECHNIQUE: Multidetector CT imaging of the abdomen and pelvis was performed using the standard protocol following bolus administration of intravenous contrast. CONTRAST:  100 ml  ISOVUE-300 IOPAMIDOL (ISOVUE-300) INJECTION 61% COMPARISON:  01/04/2014 FINDINGS: Lower chest: Linear scarring in the right lung base, possibly associated with surgical change. Atelectasis in the lung bases. Hepatobiliary: Diffuse fatty infiltration of the liver. No focal liver lesions identified. Gallbladder is surgically absent. Bile ducts are not dilated. Pancreas: There is fluid around the head of the pancreas extending into the gallbladder fossa and along the right anterior pararenal space. This appearance is suspicious for focal pancreatitis. No loculated collections. No evidence of pancreatic necrosis or pancreatic duct dilatation. Spleen: Normal in size without focal abnormality. Adrenals/Urinary Tract: Adrenal glands are unremarkable. Kidneys are normal, without renal calculi, focal lesion, or hydronephrosis. Bladder is unremarkable. Stomach/Bowel: Stomach, small bowel,  and colon are not abnormally distended. No wall thickening is appreciated. Scattered diverticula in the colon without evidence of diverticulitis. There is not infraumbilical ventral abdominal wall hernia containing fat and transverse colon. No proximal obstruction. The hernia is new since previous study. Appendix is normal. Vascular/Lymphatic: Aortic atherosclerosis. No enlarged abdominal or pelvic lymph nodes. Reproductive: Uterus and bilateral adnexa are unremarkable. Other: Rounded calcification with central fat density in the left anterior pelvis. No change since prior study. This likely represents calcified fat necrosis. No free air in the abdomen. Musculoskeletal: Degenerative changes in the spine. No destructive bone lesions. IMPRESSION: Edema around the head of the pancreas with extension into the gallbladder fossa and right anterior pararenal space. Changes are likely to represent focal pancreatitis. No loculated collection or pancreatic necrosis. Anterior abdominal wall hernia containing transverse colon. No obstruction. Diffuse fatty infiltration of the liver. Electronically Signed   By: Lucienne Capers M.D.   On: 10/04/2016 01:46    EKG: Independently reviewed.  Assessment/Plan Active Problems:   Acute pancreatitis   HTN (hypertension)   HLD (hyperlipidemia)   Diabetes mellitus type 2 in obese [facility])   Coronary atherosclerosis seen on CT   Paroxysmal atrial fibrillation (HCC)   Acute abdominal pain likely due to acute Pancreatitis, confirmed by CT abdomen and pelvis : Lipase 335, AST 31, ALT 24    AF VSS, WBC 12  No history of previous pancreatic flare. No ETOH or tobacco . She is s/p cholecystectomy in the past    NPO  generous IVF  lipid panel Pain control with IV Dilaudid   Consider GI consult if not improving  Type II Diabetes Current blood sugar level was 423 on admission 307 , anion gap was 16 on admission. Received 10 units Novolog . Elevated sugar likely precipitated by  pancreatitis.  Lab Results  Component Value Date   HGBA1C 13.4 (H) 02/16/2013  Hgb A1C  recheck CBG  q 4 h with SSI  BMET now to monitor her anion gap IVF  Continue Lantus  Glucophage on hold    Praoxysmal Atrial Fibrillation, new diagnosis, now converted spontaneously toSR  CHA2DS2-VASc score 2.  Tn negative  Patient denies chest pain or palpitations  Will need to follow up as outpatient with Cardiology ASA qd   DVT prophylaxis: Lovenox   Code Status:   Full      Family Communication:  Discussed with patient Disposition Plan: Expect patient to be discharged to home after condition improves Consults called:  NOne  Admission status:Tele  Obs   Rondel Jumbo, PA-C [facility]   10/04/2016, 8:18 AM

## 2016-10-05 ENCOUNTER — Inpatient Hospital Stay (HOSPITAL_COMMUNITY): Payer: Self-pay

## 2016-10-05 DIAGNOSIS — R739 Hyperglycemia, unspecified: Secondary | ICD-10-CM

## 2016-10-05 LAB — LIPID PANEL
CHOLESTEROL: 522 mg/dL — AB (ref 0–200)
LDL Cholesterol: UNDETERMINED mg/dL (ref 0–99)
Triglycerides: 1392 mg/dL — ABNORMAL HIGH (ref <150)
VLDL: UNDETERMINED mg/dL (ref 0–40)

## 2016-10-05 LAB — GLUCOSE, CAPILLARY
GLUCOSE-CAPILLARY: 222 mg/dL — AB (ref 65–99)
GLUCOSE-CAPILLARY: 224 mg/dL — AB (ref 65–99)
Glucose-Capillary: 181 mg/dL — ABNORMAL HIGH (ref 65–99)
Glucose-Capillary: 208 mg/dL — ABNORMAL HIGH (ref 65–99)
Glucose-Capillary: 157 mg/dL — ABNORMAL HIGH (ref 65–99)

## 2016-10-05 LAB — COMPREHENSIVE METABOLIC PANEL
ALK PHOS: 70 U/L (ref 38–126)
ALT: 17 U/L (ref 14–54)
AST: 21 U/L (ref 15–41)
Albumin: 2.8 g/dL — ABNORMAL LOW (ref 3.5–5.0)
Anion gap: 11 (ref 5–15)
BUN: 7 mg/dL (ref 6–20)
CALCIUM: 7.6 mg/dL — AB (ref 8.9–10.3)
CO2: 23 mmol/L (ref 22–32)
CREATININE: 0.43 mg/dL — AB (ref 0.44–1.00)
Chloride: 100 mmol/L — ABNORMAL LOW (ref 101–111)
Glucose, Bld: 230 mg/dL — ABNORMAL HIGH (ref 65–99)
Potassium: 3 mmol/L — ABNORMAL LOW (ref 3.5–5.1)
Sodium: 134 mmol/L — ABNORMAL LOW (ref 135–145)
TOTAL PROTEIN: 6 g/dL — AB (ref 6.5–8.1)
Total Bilirubin: 1.2 mg/dL (ref 0.3–1.2)

## 2016-10-05 LAB — PROTIME-INR
INR: 1.03
Prothrombin Time: 13.5 seconds (ref 11.4–15.2)

## 2016-10-05 LAB — CBC
HCT: 33.6 % — ABNORMAL LOW (ref 36.0–46.0)
Hemoglobin: 11.4 g/dL — ABNORMAL LOW (ref 12.0–15.0)
MCH: 28.7 pg (ref 26.0–34.0)
MCHC: 33.9 g/dL (ref 30.0–36.0)
MCV: 84.6 fL (ref 78.0–100.0)
PLATELETS: 150 10*3/uL (ref 150–400)
RBC: 3.97 MIL/uL (ref 3.87–5.11)
RDW: 12.4 % (ref 11.5–15.5)
WBC: 9.3 10*3/uL (ref 4.0–10.5)

## 2016-10-05 LAB — HEPARIN LEVEL (UNFRACTIONATED): Heparin Unfractionated: 0.12 IU/mL — ABNORMAL LOW (ref 0.30–0.70)

## 2016-10-05 LAB — LIPASE, BLOOD: Lipase: 126 U/L — ABNORMAL HIGH (ref 11–51)

## 2016-10-05 MED ORDER — FENOFIBRATE 160 MG PO TABS
160.0000 mg | ORAL_TABLET | Freq: Every day | ORAL | Status: DC
  Administered 2016-10-05 – 2016-10-09 (×5): 160 mg via ORAL
  Filled 2016-10-05 (×5): qty 1

## 2016-10-05 MED ORDER — INSULIN GLARGINE 100 UNIT/ML ~~LOC~~ SOLN
12.0000 [IU] | Freq: Every day | SUBCUTANEOUS | Status: DC
  Administered 2016-10-05 – 2016-10-08 (×4): 12 [IU] via SUBCUTANEOUS
  Filled 2016-10-05 (×4): qty 0.12

## 2016-10-05 MED ORDER — HEPARIN BOLUS VIA INFUSION
4000.0000 [IU] | Freq: Once | INTRAVENOUS | Status: AC
  Administered 2016-10-05: 4000 [IU] via INTRAVENOUS
  Filled 2016-10-05: qty 4000

## 2016-10-05 MED ORDER — HEPARIN (PORCINE) IN NACL 100-0.45 UNIT/ML-% IJ SOLN
1650.0000 [IU]/h | INTRAMUSCULAR | Status: DC
  Administered 2016-10-05: 1050 [IU]/h via INTRAVENOUS
  Administered 2016-10-06: 1550 [IU]/h via INTRAVENOUS
  Administered 2016-10-06: 1300 [IU]/h via INTRAVENOUS
  Administered 2016-10-07 – 2016-10-08 (×2): 1650 [IU]/h via INTRAVENOUS
  Filled 2016-10-05 (×5): qty 250

## 2016-10-05 NOTE — Progress Notes (Signed)
ANTICOAGULATION CONSULT NOTE - Initial Consult  Pharmacy Consult for heparin Indication: atrial fibrillation  No Known Allergies  Patient Measurements: Height:  (160 cm) Weight: 167 lb 12.8 oz (76.1 kg) IBW/kg (Calculated) : 52.4 Heparin Dosing Weight: 68.7kg  Vital Signs: Temp: 98.8 F (37.1 C) (04/22 0451) Temp Source: Oral (04/22 0451) BP: 110/77 (04/22 0451) Pulse Rate: 98 (04/22 0451)  Labs:  Recent Labs  10/03/16 2240 10/04/16 0846 10/04/16 1231 10/05/16 0601 10/05/16 0917  HGB 12.9  --   --  11.4*  --   HCT 36.1  --   --  33.6*  --   PLT 206  --   --  150  --   LABPROT  --   --   --   --  13.5  INR  --   --   --   --  1.03  CREATININE 0.63 0.55 0.48 0.43*  --     Estimated Creatinine Clearance: 74 mL/min (A) (by C-G formula based on SCr of 0.43 mg/dL (L)).   Medical History: Past Medical History:  Diagnosis Date  . Arthritis   . Diabetes mellitus   . Gout   . Hyperlipidemia   . Hypertension     Assessment: 8 YOF admitted for acute pancreatitis and was found in Afib with RVR in ED that did convert back to sinus rhythm. CHADS2VASC score of 3 so provider would still like to start IV heparin  -Hgb 11.4, Plts 150  Goal of Therapy:  Heparin level 0.3-0.7 units/ml Monitor platelets by anticoagulation protocol: Yes   Plan:  -heparin 4000 unit bolus, then 1050 units/hr -6hr heparin level -daily CBC/HL -monitor S/Sx bleeding -f/u plan for PO AC  Gwyndolyn Kaufman Bernette Redbird), PharmD  PGY1 Pharmacy Resident Pager: [phone] 10/05/2016 11:55 AM

## 2016-10-05 NOTE — Progress Notes (Signed)
ANTICOAGULATION CONSULT NOTE  Pharmacy Consult for heparin Indication: atrial fibrillation  No Known Allergies  Patient Measurements: Height:  (160 cm) Weight: 167 lb 12.8 oz (76.1 kg) IBW/kg (Calculated) : 52.4 Heparin Dosing Weight: 68.7kg  Vital Signs: Temp: 98.7 F (37.1 C) (04/22 1300) Temp Source: Oral (04/22 1300) BP: 133/50 (04/22 1300) Pulse Rate: 91 (04/22 1300)  Labs:  Recent Labs  10/03/16 2240 10/04/16 0846 10/04/16 1231 10/05/16 0601 10/05/16 0917 10/05/16 1816  HGB 12.9  --   --  11.4*  --   --   HCT 36.1  --   --  33.6*  --   --   PLT 206  --   --  150  --   --   LABPROT  --   --   --   --  13.5  --   INR  --   --   --   --  1.03  --   HEPARINUNFRC  --   --   --   --   --  0.12*  CREATININE 0.63 0.55 0.48 0.43*  --   --     Estimated Creatinine Clearance: 74 mL/min (A) (by C-G formula based on SCr of 0.43 mg/dL (L)).   Medical History: Past Medical History:  Diagnosis Date  . Arthritis   . Diabetes mellitus   . Gout   . Hyperlipidemia   . Hypertension     Assessment: 62 YOF admitted for acute pancreatitis and was found in Afib with RVR in ED that did convert back to sinus rhythm. CHADS2VASC score of 3 so provider would still like to start IV heparin. -Hgb 11.4, Plts 150  Initial heparin level low (0.12) on 1050 units/h. Drip has not been turned off and no bleed or IV line issues per RN.  Goal of Therapy:  Heparin level 0.3-0.7 units/ml Monitor platelets by anticoagulation protocol: Yes   Plan:  -Increase heparin IV to 1300 units/hr -6hr heparin level -Daily CBC/HL -Monitor S/Sx bleeding -F/u plans for long-term anticoagulation   Babs Bertin, PharmD, BCPS Clinical Pharmacist 10/05/2016 7:30 PM

## 2016-10-05 NOTE — Progress Notes (Signed)
PROGRESS NOTE        PATIENT DETAILS Name: Emily Farmer Age: 61 y.o. Sex: female Date of Birth: 08/31/1955 Admit Date: 10/03/2016 Admitting Physician Levie Heritage, DO WUJ:[id number] Kathlynn Grate, MD  Brief Narrative: Patient is a 61 y.o. female with history of diabetes, hyperlipidemia, hypertension isn't into the ED for evaluation of epigastric pain, found to have acute pancreatitis. Further evaluation revealed a triglyceride level of 1392. Admitted for further evaluation and treatment, see below for further details.  Subjective: Epigastric pain somewhat better-but still ongoing at times. Appears comfortable  No nausea, vomiting or diarrhea. No chest pain.  Assessment/Plan: Acute pancreatitis: Etiology felt to be secondary to hypertriglyceridemia. She is status post cholecystectomy, and denies history of alcohol use. She is slowly improving with supportive measures-abdominal exam appears benign. Since this is likely secondary to hypertriglyceridemia-nothing by mouth status have been decreasing triglyceride levels. Will start TriCor-and continue to follow triglyceride levels-if needed we will start IV insulin-if triglycerides level do not decrease of clinical improvement does not occur in the next day or so.   Hypertriglyceridemia: See above.  Atrial fibrillation with RVR: Spontaneously converted back to sinus rhythm in the emergency room.CHADS2VASC score of at least 3-begin IV heparin. Echo showed preserved EF. When ordering take is established, we will transition to oral anticoagulant  Hypertension: Blood pressure currently controlled without the use of any antihypertensives.  Insulin-dependent type 2 diabetes with hyperglycemia: CBGs uncontrolled- mostly in the 200s range-will increase Lantus to 10 units and follow.  DVT Prophylaxis: Full dose anticoagulation with Heparin  Code Status: Full code  Family Communication: None at bedside  Disposition  Plan: Remain inpatient-home in 2-3 days  Antimicrobial agents: Anti-infectives    None     Procedures: Echo 4/22>>  - Normal LV wall thickness with LVEF 65-70% and grade 2 diastolic   dysfunction. Mild mitral regurgitation. Mildly calcified aortic   valve.Trivial tricuspid regurgitation with PASP 30 mmHg. Cannot   exclude PFO, could consider saline contrast evaluation if   clinical question remains.  CONSULTS:  None  Time spent: 25- minutes-Greater than 50% of this time was spent in counseling, explanation of diagnosis, planning of further management, and coordination of care.  MEDICATIONS: Scheduled Meds: . atorvastatin  10 mg Oral Daily  . enoxaparin (LOVENOX) injection  40 mg Subcutaneous Q24H  . insulin aspart  0-9 Units Subcutaneous TID WC  . insulin glargine  6 Units Subcutaneous QHS   Continuous Infusions: . sodium chloride 150 mL/hr at 10/05/16 0643   PRN Meds:.acetaminophen **OR** acetaminophen, bisacodyl, fentaNYL (SUBLIMAZE) injection, HYDROcodone-acetaminophen, HYDROmorphone (DILAUDID) injection, magnesium citrate, ondansetron **OR** ondansetron (ZOFRAN) IV, senna-docusate, traZODone   PHYSICAL EXAM: Vital signs: Vitals:   10/04/16 1300 10/04/16 2009 10/05/16 0036 10/05/16 0451  BP: (!) 112/57 114/60 (!) 116/59 110/77  Pulse: 86 89 94 98  Resp: Temp: 97.7 F (36.5 C) 98.7 F (37.1 C) 98.7 F (37.1 C) 98.8 F (37.1 C)  TempSrc: Oral Oral Oral Oral  SpO2: 92% 92% 92% 90%  Weight:    76.1 kg (167 lb 12.8 oz)  Height:       Filed Weights   10/04/16 0810 10/05/16 0451  Weight: 74 kg (163 lb 3.2 oz) 76.1 kg (167 lb 12.8 oz)   Body mass index is 29.72 kg/m.   General appearance :Awake, alert, not in any  distress. Speech Clear.  Eyes:, pupils equally reactive to light and accomodation,no scleral icterus. HEENT: Atraumatic and Normocephalic Neck: supple, no JVD. No cervical lymphadenopathy. N Resp:Good air entry bilaterally, no added  sounds  CVS: S1 S2 regular, no murmurs.  GI: Bowel sounds present, Tender in the epigastric area but without any peritoneal signs. Extremities: B/L Lower Ext shows no edema, both legs are warm to touch Neurology:  speech clear,Non focal, sensation is grossly intact. Psychiatric: Normal judgment and insight. Alert and oriented x 3. Normal mood. Musculoskeletal:No digital cyanosis Skin:No Rash, warm and dry Wounds:N/A  I have personally reviewed following labs and imaging studies  LABORATORY DATA: CBC:  Recent Labs Lab 10/03/16 2240 10/05/16 0601  WBC 12.1* 9.3  HGB 12.9 11.4*  HCT 36.1 33.6*  MCV 81.5 84.6  PLT 206 150    Basic Metabolic Panel:  Recent Labs Lab 10/03/16 2240 10/04/16 0846 10/04/16 1231 10/05/16 0601  NA 128* 133* 133* 134*  K 3.5 3.3* 3.3* 3.0*  CL 90* 95* 99* 100*  CO2 GLUCOSE 423* 337* 302* 230*  BUN CREATININE 0.63 0.55 0.48 0.43*  CALCIUM 9.9 8.6* 8.1* 7.6*    GFR: Estimated Creatinine Clearance: 74 mL/min (A) (by C-G formula based on SCr of 0.43 mg/dL (L)).  Liver Function Tests:  Recent Labs Lab 10/04/16 0216 10/05/16 0601  AST 31 21  ALT 24 17  ALKPHOS 63 70  BILITOT 0.8 1.2  PROT 6.7 6.0*  ALBUMIN 3.6 2.8*    Recent Labs Lab 10/04/16 0216 10/05/16 0601  LIPASE 335* 126*   No results for input(s): AMMONIA in the last 168 hours.  Coagulation Profile:  Recent Labs Lab 10/05/16 0917  INR 1.03    Cardiac Enzymes: No results for input(s): CKTOTAL, CKMB, CKMBINDEX, TROPONINI in the last 168 hours.  BNP (last 3 results) No results for input(s): PROBNP in the last 8760 hours.  HbA1C: No results for input(s): HGBA1C in the last 72 hours.  CBG:  Recent Labs Lab 10/04/16 1703 10/04/16 2003 10/05/16 0001 10/05/16 0440 10/05/16 0722  GLUCAP 211* 212* 216* 222* 224*    Lipid Profile:  Recent Labs  10/05/16 0601  CHOL 522*  HDL NOT REPORTED DUE TO HIGH TRIGLYCERIDES  LDLCALC UNABLE  TO CALCULATE IF TRIGLYCERIDE OVER 400 mg/dL  TRIG 1,610*  CHOLHDL NOT REPORTED DUE TO HIGH TRIGLYCERIDES    Thyroid Function Tests: No results for input(s): TSH, T4TOTAL, FREET4, T3FREE, THYROIDAB in the last 72 hours.  Anemia Panel: No results for input(s): VITAMINB12, FOLATE, FERRITIN, TIBC, IRON, RETICCTPCT in the last 72 hours.  Urine analysis:    Component Value Date/Time   COLORURINE YELLOW 10/04/2016 1223   APPEARANCEUR CLEAR 10/04/2016 1223   LABSPEC 1.040 (H) 10/04/2016 1223   PHURINE 5.0 10/04/2016 1223   GLUCOSEU >=500 (A) 10/04/2016 1223   HGBUR MODERATE (A) 10/04/2016 1223   BILIRUBINUR NEGATIVE 10/04/2016 1223   KETONESUR 80 (A) 10/04/2016 1223   PROTEINUR NEGATIVE 10/04/2016 1223   UROBILINOGEN 0.2 01/03/2014 2001   NITRITE NEGATIVE 10/04/2016 1223   LEUKOCYTESUR NEGATIVE 10/04/2016 1223    Sepsis Labs: Lactic Acid, Venous    Component Value Date/Time   LATICACIDVEN 2.1 06/19/2011 1456    MICROBIOLOGY: No results found for this or any previous visit (from the past 240 hour(s)).  RADIOLOGY STUDIES/RESULTS: X-ray Chest Pa And Lateral  Result Date: 10/05/2016 CLINICAL DATA:  History of atrial fibrillation and pancreatitis. EXAM: CHEST  2 VIEW  COMPARISON:  Chest radiograph 12/13/2015. FINDINGS: Monitoring leads overlie the patient. Stable cardiac and mediastinal contours. Low lung volumes. Heterogeneous opacities lung bases bilaterally. No definite pleural effusion or pneumothorax. Thoracic spine degenerative changes. IMPRESSION: Low lung volumes with basilar opacities favored to represent atelectasis. Electronically Signed   By: Annia Belt M.D.   On: 10/05/2016 07:12   Ct Abdomen Pelvis W Contrast  Result Date: 10/04/2016 CLINICAL DATA:  Right-sided and lower abdominal pain with vomiting for a few hours ago. History of cholecystectomy. EXAM: CT ABDOMEN AND PELVIS WITH CONTRAST TECHNIQUE: Multidetector CT imaging of the abdomen and pelvis was performed using  the standard protocol following bolus administration of intravenous contrast. CONTRAST:  100 ml  ISOVUE-300 IOPAMIDOL (ISOVUE-300) INJECTION 61% COMPARISON:  01/04/2014 FINDINGS: Lower chest: Linear scarring in the right lung base, possibly associated with surgical change. Atelectasis in the lung bases. Hepatobiliary: Diffuse fatty infiltration of the liver. No focal liver lesions identified. Gallbladder is surgically absent. Bile ducts are not dilated. Pancreas: There is fluid around the head of the pancreas extending into the gallbladder fossa and along the right anterior pararenal space. This appearance is suspicious for focal pancreatitis. No loculated collections. No evidence of pancreatic necrosis or pancreatic duct dilatation. Spleen: Normal in size without focal abnormality. Adrenals/Urinary Tract: Adrenal glands are unremarkable. Kidneys are normal, without renal calculi, focal lesion, or hydronephrosis. Bladder is unremarkable. Stomach/Bowel: Stomach, small bowel, and colon are not abnormally distended. No wall thickening is appreciated. Scattered diverticula in the colon without evidence of diverticulitis. There is not infraumbilical ventral abdominal wall hernia containing fat and transverse colon. No proximal obstruction. The hernia is new since previous study. Appendix is normal. Vascular/Lymphatic: Aortic atherosclerosis. No enlarged abdominal or pelvic lymph nodes. Reproductive: Uterus and bilateral adnexa are unremarkable. Other: Rounded calcification with central fat density in the left anterior pelvis. No change since prior study. This likely represents calcified fat necrosis. No free air in the abdomen. Musculoskeletal: Degenerative changes in the spine. No destructive bone lesions. IMPRESSION: Edema around the head of the pancreas with extension into the gallbladder fossa and right anterior pararenal space. Changes are likely to represent focal pancreatitis. No loculated collection or pancreatic  necrosis. Anterior abdominal wall hernia containing transverse colon. No obstruction. Diffuse fatty infiltration of the liver. Electronically Signed   By: Burman Nieves M.D.   On: 10/04/2016 01:46     LOS: 1 day   Jeoffrey Massed, MD  [facility] Pager:[phone]  If 7PM-7AM, please contact night-coverage [email or web address] Password [facility] 10/05/2016, 11:22 AM

## 2016-10-06 DIAGNOSIS — E781 Pure hyperglyceridemia: Secondary | ICD-10-CM

## 2016-10-06 DIAGNOSIS — E783 Hyperchylomicronemia: Secondary | ICD-10-CM

## 2016-10-06 DIAGNOSIS — K858 Other acute pancreatitis without necrosis or infection: Secondary | ICD-10-CM

## 2016-10-06 LAB — COMPREHENSIVE METABOLIC PANEL
ALBUMIN: 2.3 g/dL — AB (ref 3.5–5.0)
ALT: 16 U/L (ref 14–54)
ANION GAP: 10 (ref 5–15)
AST: 22 U/L (ref 15–41)
Alkaline Phosphatase: 78 U/L (ref 38–126)
BILIRUBIN TOTAL: 1.2 mg/dL (ref 0.3–1.2)
BUN: 6 mg/dL (ref 6–20)
CO2: 19 mmol/L — AB (ref 22–32)
Calcium: 6.9 mg/dL — ABNORMAL LOW (ref 8.9–10.3)
Chloride: 105 mmol/L (ref 101–111)
Creatinine, Ser: 0.53 mg/dL (ref 0.44–1.00)
GFR calc non Af Amer: >60 mL/min (ref >60)
GLUCOSE: 189 mg/dL — AB (ref 65–99)
POTASSIUM: 2.5 mmol/L — AB (ref 3.5–5.1)
SODIUM: 134 mmol/L — AB (ref 135–145)
TOTAL PROTEIN: 5.4 g/dL — AB (ref 6.5–8.1)

## 2016-10-06 LAB — GLUCOSE, CAPILLARY
GLUCOSE-CAPILLARY: 174 mg/dL — AB (ref 65–99)
GLUCOSE-CAPILLARY: 152 mg/dL — AB (ref 65–99)
GLUCOSE-CAPILLARY: 134 mg/dL — AB (ref 65–99)
Glucose-Capillary: 156 mg/dL — ABNORMAL HIGH (ref 65–99)
Glucose-Capillary: 173 mg/dL — ABNORMAL HIGH (ref 65–99)
Glucose-Capillary: 179 mg/dL — ABNORMAL HIGH (ref 65–99)

## 2016-10-06 LAB — HEMOGLOBIN A1C
Hgb A1c MFr Bld: 13.7 % — ABNORMAL HIGH (ref 4.8–5.6)
MEAN PLASMA GLUCOSE: 346 mg/dL

## 2016-10-06 LAB — CBC
HCT: 29 % — ABNORMAL LOW (ref 36.0–46.0)
Hemoglobin: 9.8 g/dL — ABNORMAL LOW (ref 12.0–15.0)
MCH: 28.6 pg (ref 26.0–34.0)
MCHC: 33.8 g/dL (ref 30.0–36.0)
MCV: 84.5 fL (ref 78.0–100.0)
Platelets: 161 10*3/uL (ref 150–400)
RBC: 3.43 MIL/uL — ABNORMAL LOW (ref 3.87–5.11)
RDW: 12.6 % (ref 11.5–15.5)
WBC: 9.9 10*3/uL (ref 4.0–10.5)

## 2016-10-06 LAB — POTASSIUM: Potassium: 3.1 mmol/L — ABNORMAL LOW (ref 3.5–5.1)

## 2016-10-06 LAB — HEPARIN LEVEL (UNFRACTIONATED)
HEPARIN UNFRACTIONATED: 0.19 [IU]/mL — AB (ref 0.30–0.70)
HEPARIN UNFRACTIONATED: 0.37 [IU]/mL (ref 0.30–0.70)
Heparin Unfractionated: 0.43 IU/mL (ref 0.30–0.70)

## 2016-10-06 LAB — TRIGLYCERIDES: Triglycerides: 672 mg/dL — ABNORMAL HIGH (ref <150)

## 2016-10-06 MED ORDER — METOPROLOL TARTRATE 25 MG PO TABS
25.0000 mg | ORAL_TABLET | Freq: Two times a day (BID) | ORAL | Status: DC
  Administered 2016-10-06 – 2016-10-09 (×7): 25 mg via ORAL
  Filled 2016-10-06 (×7): qty 1

## 2016-10-06 MED ORDER — PANTOPRAZOLE SODIUM 40 MG PO TBEC
40.0000 mg | DELAYED_RELEASE_TABLET | Freq: Every day | ORAL | Status: DC
  Filled 2016-10-06: qty 1

## 2016-10-06 MED ORDER — ATORVASTATIN CALCIUM 20 MG PO TABS: 20.0000 mg | ORAL_TABLET | Freq: Every day | ORAL | Status: DC

## 2016-10-06 MED ORDER — DILTIAZEM HCL 25 MG/5ML IV SOLN
10.0000 mg | Freq: Once | INTRAVENOUS | Status: AC
  Administered 2016-10-06: 10 mg via INTRAVENOUS
  Filled 2016-10-06: qty 5

## 2016-10-06 MED ORDER — POTASSIUM CHLORIDE 2 MEQ/ML IV SOLN
30.0000 meq | INTRAVENOUS | Status: AC
  Administered 2016-10-06 (×2): 30 meq via INTRAVENOUS
  Filled 2016-10-06 (×2): qty 15

## 2016-10-06 NOTE — Progress Notes (Signed)
ANTICOAGULATION CONSULT NOTE  Pharmacy Consult for heparin Indication: atrial fibrillation  No Known Allergies  Patient Measurements: Height:  (160 cm) Weight: 174 lb 9.6 oz (79.2 kg) IBW/kg (Calculated) : 52.4 Heparin Dosing Weight: 68.7kg  Vital Signs: Temp: 98.8 F (37.1 C) (04/23 0438) Temp Source: Oral (04/23 0438) BP: 124/66 (04/23 0438) Pulse Rate: 94 (04/23 0438)  Labs:  Recent Labs  10/03/16 2240 10/04/16 0846 10/04/16 1231 10/05/16 0601 10/05/16 0917 10/05/16 1816 10/06/16 0342  HGB 12.9  --   --  11.4*  --   --  9.8*  HCT 36.1  --   --  33.6*  --   --  29.0*  PLT 206  --   --  150  --   --  161  LABPROT  --   --   --   --  13.5  --   --   INR  --   --   --   --  1.03  --   --   HEPARINUNFRC  --   --   --   --   --  0.12* 0.19*  CREATININE 0.63 0.55 0.48 0.43*  --   --   --     Estimated Creatinine Clearance: 75.4 mL/min (A) (by C-G formula based on SCr of 0.43 mg/dL (L)).  Assessment: 24 YOF admitted for acute pancreatitis and was found in Afib with RVR in ED that did convert back to sinus rhythm. CHADS2VASC score of 3 so provider started IV heparin.  Heparin level remains subtherapeutic (0.19) on gtt at 1300 units/hr. No issues with line or bleeding reported per RN. Hgb down to 9.8.  Goal of Therapy:  Heparin level 0.3-0.7 units/ml Monitor platelets by anticoagulation protocol: Yes   Plan:  Increase heparin IV to 1550 units/hr F/u 6hr heparin level  Christoper Fabian, PharmD, BCPS Clinical pharmacist, pager [phone] 10/06/2016 4:58 AM

## 2016-10-06 NOTE — Progress Notes (Signed)
Pt had 3 loose BM this morning. Day RN informed and will notify the MD.

## 2016-10-06 NOTE — Progress Notes (Signed)
   10/06/16 0057  Vitals  BP (!) 109/54  MAP (mmHg) 68  BP Location Left Arm  BP Method Automatic  Patient Position (if appropriate) Lying  Pulse Rate 86  Pulse Rate Source Dinamap  Cardiac Rhythm NSR  Resp 17  Oxygen Therapy  SpO2 97 %  O2 Device Nasal Cannula  O2 Flow Rate (L/min) 2 L/min  Pt's converted back to NSR after  of cardizem IV. Will continue to monitor.

## 2016-10-06 NOTE — Progress Notes (Signed)
Rechecked HR 84.

## 2016-10-06 NOTE — Consult Note (Signed)
CARDIOLOGY CONSULT NOTE   Patient ID: Emily Farmer MRN: [id number] DOB/AGE: 1955/11/30 61 y.o.  Admit date: 10/03/2016  Requesting Physician: Dr. Jerral Ralph Primary Physician:   Dorrene German, MD Primary Cardiologist: New- Dr. Rennis Golden  Reason for Consultation: new onset afib   Emily Farmer is a 61 y.o. female who is being seen today for the evaluation of atrial fibrillation at the request of Dr. Jerral Ralph.   HPI: Emily Farmer is a 61 y.o. female with a history of DMT2, Gout, HLD, HTN, prior cholecystectomy who presented to [facility] on 10/04/16 with acute abdominal pain. Found to have acute pancreatitis as well as well as new onset atrial fibrillation with RVR and cardiology consulted.   She was in her usual state of health until last Friday when she started having abdominal pain. No CP or SOB. No LE edema, orthopnea or PND. No dizziness or syncope. No blood in stool or urine. She sometimes feels palpitations when she is laying on her left side in bed. She works in a [employer] and is fairly active on the job. She denies exertional symptoms or recent decreased exercise tolerance.   She is currently feeling much better.     Past Medical History:  Diagnosis Date  . Arthritis   . Diabetes mellitus   . Gout   . Hyperlipidemia   . Hypertension      Past Surgical History:  Procedure Laterality Date  . CHOLECYSTECTOMY N/A 01/04/2014   Procedure: LAPAROSCOPIC CHOLECYSTECTOMY WITH INTRAOPERATIVE CHOLANGIOGRAM;  Surgeon: Axel Filler, MD;  Location: [facility] OR;  Service: General;  Laterality: N/A;  . LUNG SURGERY      No Known Allergies  I have reviewed the patient's current medications . fenofibrate  160 mg Oral Daily  . insulin aspart  0-9 Units Subcutaneous TID WC  . insulin glargine  12 Units Subcutaneous QHS  . metoprolol tartrate  25 mg Oral BID   . sodium chloride 50 mL/hr at 10/06/16 0855  . heparin 1,550 Units/hr (10/06/16 0538)   acetaminophen **OR** acetaminophen,  bisacodyl, fentaNYL (SUBLIMAZE) injection, HYDROcodone-acetaminophen, HYDROmorphone (DILAUDID) injection, magnesium citrate, ondansetron **OR** ondansetron (ZOFRAN) IV, senna-docusate, traZODone  Prior to Admission medications   Medication Sig Start Date End Date Taking? Authorizing Provider  atorvastatin (LIPITOR) 10 MG tablet Take 10 mg by mouth daily.   Yes Historical Provider, MD  insulin glargine (LANTUS) 100 UNIT/ML injection Inject 6 Units into the skin at bedtime.   Yes Historical Provider, MD  metFORMIN (GLUCOPHAGE) 500 MG tablet Take 500 mg by mouth 2 (two) times daily with a meal.   Yes Historical Provider, MD     Social History   Social History  . Marital status: Single    Spouse name: N/A  . Number of children: N/A  . Years of education: N/A   Occupational History  . Not on file.   Social History Main Topics  . Smoking status: Never Smoker  . Smokeless tobacco: Never Used  . Alcohol use No  . Drug use: No  . Sexual activity: Not on file   Other Topics Concern  . Not on file   Social History Narrative   Lives with her sister and does not need any assistance with ADLs.      Family Status  Relation Status  . Mother Deceased  . Father Alive  . Sister   . Neg Hx    Family History  Problem Relation Age of Onset  . Diabetes Sister   .  High blood pressure Neg Hx   . High Cholesterol Neg Hx   . Heart attack Neg Hx        ROS:  Full 14 point review of systems complete and found to be negative unless listed above.  Physical Exam: Blood pressure 124/66, pulse 94, temperature 98.8 F (37.1 C), temperature source Oral, resp. rate 18, height  (1.6 m), weight 174 lb 9.6 oz (79.2 kg), SpO2 97 %.  General: Well developed, well nourished, female in no acute distress Head: Eyes PERRLA. No Xanthomas. Normocephalic and atraumatic, oropharynx without edema or exudate.  Lungs: CTAB Heart: HRRR S1 S2, no rub/gallop, Heart regular rate and rhythm with S1, S2 no murmur.  pulses are 2+ extrem.   Neck: No carotid bruits. No lymphadenopathy. No JVD. Abdomen: Bowel sounds present, abdomen soft and non-tender without masses or hernias noted. Msk:  No spine or cva tenderness. No weakness, no joint deformities or effusions. Extremities: No clubbing or cyanosis. No LE edema.  Neuro: Alert and oriented X 3. No focal deficits noted. Psych:  Good affect, responds appropriately Skin: No rashes or lesions noted.  Labs:   Lab Results  Component Value Date   WBC 9.9 10/06/2016   HGB 9.8 (L) 10/06/2016   HCT 29.0 (L) 10/06/2016   MCV 84.5 10/06/2016   PLT 161 10/06/2016    Recent Labs  10/05/16 0917  INR 1.03    Recent Labs Lab 10/06/16 0342  NA 134*  K 2.5*  CL 105  CO2 19*  BUN 6  CREATININE 0.53  CALCIUM 6.9*  PROT 5.4*  BILITOT 1.2  ALKPHOS 78  ALT 16  AST 22  GLUCOSE 189*  ALBUMIN 2.3*   Magnesium  Date Value Ref Range Status  02/18/2013 1.5 1.5 - 2.5 mg/dL Final   No results for input(s): CKTOTAL, CKMB, TROPONINI in the last 72 hours.  Recent Labs  10/03/16 2254  TROPIPOC 0.03   No results found for: PROBNP Lab Results  Component Value Date   CHOL 522 (H) 10/05/2016   HDL NOT REPORTED DUE TO HIGH TRIGLYCERIDES 10/05/2016   LDLCALC UNABLE TO CALCULATE IF TRIGLYCERIDE OVER 400 mg/dL 16/03/9603   TRIG 540 (H) 10/06/2016   No results found for: DDIMER Lipase  Date/Time Value Ref Range Status  10/05/2016 06:01 AM 126 (H) 11 - 51 U/L Final   TSH  Date/Time Value Ref Range Status  06/19/2011 10:10 PM 0.316 (L) 0.350 - 4.500 uIU/mL Final   No results found for: VITAMINB12, FOLATE, FERRITIN, TIBC, IRON, RETICCTPCT  Echo: 10/04/2016 LV EF: 65% -   70% Study Conclusions - Left ventricle: The cavity size was normal. Wall thickness was   normal. Systolic function was vigorous. The estimated ejection   fraction was in the range of 65% to 70%. Wall motion was normal;   there were no regional wall motion abnormalities. Features  are   consistent with a pseudonormal left ventricular filling pattern,   with concomitant abnormal relaxation and increased filling   pressure (grade 2 diastolic dysfunction). - Aortic valve: Mildly calcified annulus. Trileaflet; mildly   calcified leaflets. - Mitral valve: There was mild regurgitation. - Right atrium: Central venous pressure (est): 3 mm Hg. - Atrial septum: A patent foramen ovale cannot be excluded. - Tricuspid valve: There was trivial regurgitation. - Pulmonary arteries: PA peak pressure: 30 mm Hg (S). - Pericardium, extracardiac: There was no pericardial effusion. Impressions: - Normal LV wall thickness with LVEF 65-70% and grade 2 diastolic  dysfunction. Mild mitral regurgitation. Mildly calcified aortic   valve.Trivial tricuspid regurgitation with PASP 30 mmHg. Cannot   exclude PFO, could consider saline contrast evaluation if   clinical question remains.  ECG:  afib with RVR HR 113 - personally reviewed  TELE: afib with RVR but converted to NSR with PACs this AM - personally reviewed  Radiology:  X-ray Chest Pa And Lateral  Result Date: 10/05/2016 CLINICAL DATA:  History of atrial fibrillation and pancreatitis. EXAM: CHEST  2 VIEW COMPARISON:  Chest radiograph 12/13/2015. FINDINGS: Monitoring leads overlie the patient. Stable cardiac and mediastinal contours. Low lung volumes. Heterogeneous opacities lung bases bilaterally. No definite pleural effusion or pneumothorax. Thoracic spine degenerative changes. IMPRESSION: Low lung volumes with basilar opacities favored to represent atelectasis. Electronically Signed   By: Annia Belt M.D.   On: 10/05/2016 07:12    ASSESSMENT AND PLAN:    Active Problems:   HTN (hypertension)   HLD (hyperlipidemia)   Diabetes mellitus type 2 in obese [facility]   Coronary atherosclerosis seen on CT   Acute pancreatitis   Paroxysmal atrial fibrillation (HCC)  Emily Farmer is a 61 y.o. female with a history of DMT2, Gout, HLD, HTN,  prior cholecystectomy who presented to [facility] on 10/04/16 with acute abdominal pain. Found to have acute pancreatitis as well as well as new onset atrial fibrillation with RVR and cardiology consulted.   PAF/Atrial fibrillation with RVR: she has been in and out of afib since admission. Had some RVR this AM but now back in sinus with PACs. 2D ECHO with normal LV function, normal LA size. Continue Lopressor  BID. CHADSVASC of at least 4 (HTN, DM, vasc dz, F sex). Currently on IV heparin. Will likely need a DOAC for thromboembolic prophylaxis. Will defer this for now.   Acute pancreatitis: likely 2/2 hypertriglyceridemia. Currently NPO and being treated with pain meds and IVFs. LFTs normal.  Hypertriglyceridemia: TG initially >1K. Fasting TG this AM showed 672. Started on fenofibrate  daily. Would recommend restarting statin therapy (atorva 10 held as it was felt to be related to diarrhea). Will get a direct LDL and restart atorva . Should start LOW FAT diet when her diet is advanced.   DMT2: HgA1c pending. BG 423 on admission. Now improving. Currently on SSI  Aortic atherosclerosis: noted on abdominal CT. Resume statin.   Anemia: Hg dropping. Possibly secondary to hemodilution. Continue to monitor given risk for hemorraghic pancreatitis.   Signed: Cline Crock, PA-C 10/06/2016 9:46 AM  Pager [phone]  Co-Sign MD

## 2016-10-06 NOTE — Progress Notes (Signed)
ANTICOAGULATION CONSULT NOTE  Pharmacy Consult for heparin Indication: atrial fibrillation  No Known Allergies  Patient Measurements: Height:  (160 cm) Weight: 174 lb 9.6 oz (79.2 kg) IBW/kg (Calculated) : 52.4 Heparin Dosing Weight: 68.7 kg  Vital Signs: Temp: 98.2 F (36.8 C) (04/23 1544) Temp Source: Oral (04/23 1544) BP: 107/55 (04/23 1544) Pulse Rate: 84 (04/23 1544)  Labs:  Recent Labs  10/03/16 2240  10/04/16 1231 10/05/16 0601 10/05/16 0917  10/06/16 0342 10/06/16 1207 10/06/16 1915  HGB 12.9  --   --  11.4*  --   --  9.8*  --   --   HCT 36.1  --   --  33.6*  --   --  29.0*  --   --   PLT 206  --   --  150  --   --  161  --   --   LABPROT  --   --   --   --  13.5  --   --   --   --   INR  --   --   --   --  1.03  --   --   --   --   HEPARINUNFRC  --   --   --   --   --   < > 0.19* 0.43 0.37  CREATININE 0.63  < > 0.48 0.43*  --   --  0.53  --   --   < > = values in this interval not displayed.  Estimated Creatinine Clearance: 75.4 mL/min (by C-G formula based on SCr of 0.53 mg/dL).  Assessment: 20 YOF admitted for acute pancreatitis and was found in Afib with RVR in ED that did convert back to sinus rhythm. CHADS2VASC score of 4 (HTN, DM, vascular disease, female) so was started on IV heparin.  Confirmatory heparin level remains therapeutic at 0.37.  Goal of Therapy:  Heparin level 0.3-0.7 units/ml Monitor platelets by anticoagulation protocol: Yes   Plan:  Continue heparin drip at 1550 units/hr Monitor daily heparin level, CBC, s/s of bleed  Enzo Bi, PharmD, [facility] Clinical Pharmacist Pager [phone] 10/06/2016 7:54 PM

## 2016-10-06 NOTE — Progress Notes (Signed)
HR 139. Metoprolol 25 mg  given. Heparin drip still in progress.

## 2016-10-06 NOTE — Progress Notes (Signed)
ANTICOAGULATION CONSULT NOTE  Pharmacy Consult for heparin Indication: atrial fibrillation  No Known Allergies  Patient Measurements: Height:  (160 cm) Weight: 174 lb 9.6 oz (79.2 kg) IBW/kg (Calculated) : 52.4 Heparin Dosing Weight: 68.7 kg  Vital Signs: Temp: 98 F (36.7 C) (04/23 1115) Temp Source: Oral (04/23 1115) BP: 112/63 (04/23 1115) Pulse Rate: 78 (04/23 1115)  Labs:  Recent Labs  10/03/16 2240  10/04/16 1231 10/05/16 0601 10/05/16 0917 10/05/16 1816 10/06/16 0342 10/06/16 1207  HGB 12.9  --   --  11.4*  --   --  9.8*  --   HCT 36.1  --   --  33.6*  --   --  29.0*  --   PLT 206  --   --  150  --   --  161  --   LABPROT  --   --   --   --  13.5  --   --   --   INR  --   --   --   --  1.03  --   --   --   HEPARINUNFRC  --   --   --   --   --  0.12* 0.19* 0.43  CREATININE 0.63  < > 0.48 0.43*  --   --  0.53  --   < > = values in this interval not displayed.  Estimated Creatinine Clearance: 75.4 mL/min (by C-G formula based on SCr of 0.53 mg/dL).  Assessment: Emily Farmer admitted for acute pancreatitis and was found in Afib with RVR in ED that did convert back to sinus rhythm. CHADS2VASC score of 4 (HTN, DM, vascular disease, female) so was started on IV heparin.  Heparin level is therapeutic at 0.43 on 1550 units/hr. No overt bleeding noted, Hgb down to 9.8, platelets have trended down but are in normal range - will watch closely. Plan to transition to DOAC once diet is advanced.  Goal of Therapy:  Heparin level 0.3-0.7 units/ml Monitor platelets by anticoagulation protocol: Yes   Plan:  Continue heparin drip at 1550 units/hr Confirmatory 6 hr heparin level Daily heparin level, CBC Monitor for s/sx of bleeding   Loura Back, PharmD, BCPS Clinical Pharmacist Phone for today [phone] Main pharmacy - [phone] 10/06/2016 1:06 PM

## 2016-10-06 NOTE — Progress Notes (Signed)
PROGRESS NOTE        PATIENT DETAILS Name: Emily Farmer Age: 61 y.o. Sex: female Date of Birth: 1955/09/02 Admit Date: 10/03/2016 Admitting Physician Levie Heritage, DO ZOX:[id number] Kathlynn Grate, MD  Brief Narrative: Patient is a 61 y.o. female with history of diabetes, hyperlipidemia, hypertension isn't into the ED for evaluation of epigastric pain, found to have acute pancreatitis. Further evaluation revealed a triglyceride level of 1392. Admitted for further evaluation and treatment, see below for further details.  Subjective: Epigastric/upper abdominal pain appears to be unchanged-she appears comfortable though.  Some loose stools this morning.  No vomiting No chest pain No headache  Assessment/Plan: Acute pancreatitis: Likely secondary to hypertriglyceridemia, she has no history of alcohol use, she is status post cholecystectomy. Continues to have mostly slight upper abdominal tenderness but the rest of the abdomen is benign on exam, we will continue nothing by mouth status-and once pain is better we will slowly advance diet. On TriCor-on IV heparin-with down trending triglyceride levels.  Hypertriglyceridemia: See above.  Atrial fibrillation with RVR: Continues to go in and out of RVR (telemetry personally reviewed)-have started low-dose metoprolol today, remains on IV heparin due to a CHADS2VASC score of at least 3. Echo showed preserved EF. When ordering take is established, we will transition to oral anticoagulant. Appreciate cardiology input.  Hypokalemia: Being repleted-by intravenous route. Recheck in a.m.  ? Diarrhea: Several loose stools earlier this morning-she has only been in the hospital for day or so-no indication to check C. difficile-no prior antimicrobial exposure in the recent past. Plan are to monitor and provide supportive care.  Hypertension: Blood pressure is controlled-now on metoprolol for A. fib RVR.   Insulin-dependent type 2  diabetes: CBGs that are controlled with 12 units of Lantus and SSI. Suspect will require more insulin dosing once diet is advanced.  DVT Prophylaxis: Full dose anticoagulation with Heparin  Code Status: Full code  Family Communication: None at bedside  Disposition Plan: Remain inpatient-home in 2-3 days  Antimicrobial agents: Anti-infectives    None     Procedures: Echo 4/22>>  - Normal LV wall thickness with LVEF 65-70% and grade 2 diastolic   dysfunction. Mild mitral regurgitation. Mildly calcified aortic   valve.Trivial tricuspid regurgitation with PASP 30 mmHg. Cannot   exclude PFO, could consider saline contrast evaluation if   clinical question remains.  CONSULTS:  None  Time spent: 25- minutes-Greater than 50% of this time was spent in counseling, explanation of diagnosis, planning of further management, and coordination of care.  MEDICATIONS: Scheduled Meds: . atorvastatin  20 mg Oral q1800  . fenofibrate  160 mg Oral Daily  . insulin aspart  0-9 Units Subcutaneous TID WC  . insulin glargine  12 Units Subcutaneous QHS  . metoprolol tartrate  25 mg Oral BID   Continuous Infusions: . sodium chloride 50 mL/hr at 10/06/16 0855  . heparin 1,550 Units/hr (10/06/16 0538)   PRN Meds:.acetaminophen **OR** acetaminophen, bisacodyl, fentaNYL (SUBLIMAZE) injection, HYDROcodone-acetaminophen, HYDROmorphone (DILAUDID) injection, magnesium citrate, ondansetron **OR** ondansetron (ZOFRAN) IV, senna-docusate, traZODone   PHYSICAL EXAM: Vital signs: Vitals:   10/05/16 2346 10/05/16 2355 10/06/16 0057 10/06/16 0438  BP: 104/62  (!) 109/54 124/66  Pulse: (!) 132  86 94  Resp: Temp: 99.6 F (37.6 C)   98.8 F (37.1 C)  TempSrc: Oral   Oral  SpO2: 92% 99% 97% 97%  Weight:    79.2 kg (174 lb 9.6 oz)  Height:       Filed Weights   10/04/16 0810 10/05/16 0451 10/06/16 0438  Weight: 74 kg (163 lb 3.2 oz) 76.1 kg (167 lb 12.8 oz) 79.2 kg (174 lb 9.6 oz)    Body mass index is 30.93 kg/m.   General appearance :Awake, alert, not in any distress.  Eyes:, pupils equally reactive to light and accomodation,no scleral icterus. HEENT: Atraumatic and Normocephalic Neck: supple, no JVD. Resp:Good air entry bilaterally-No rales or rhonchi CVS: S1 S2 regular, no murmurs.  GI: Bowel sounds present, mildly tender upper abdomen-but no peritoneal signs.  Extremities: B/L Lower Ext shows no edema, both legs are warm to touch Neurology:  speech clear,Non focal, sensation is grossly intact. Psychiatric: Normal judgment and insight. Normal mood. Musculoskeletal:No digital cyanosis Skin:No Rash, warm and dry Wounds:N/A  I have personally reviewed following labs and imaging studies  LABORATORY DATA: CBC:  Recent Labs Lab 10/03/16 2240 10/05/16 0601 10/06/16 0342  WBC 12.1* 9.3 9.9  HGB 12.9 11.4* 9.8*  HCT 36.1 33.6* 29.0*  MCV 81.5 84.6 84.5  PLT 206 150 161    Basic Metabolic Panel:  Recent Labs Lab 10/03/16 2240 10/04/16 0846 10/04/16 1231 10/05/16 0601 10/06/16 0342  NA 128* 133* 133* 134* 134*  K 3.5 3.3* 3.3* 3.0* 2.5*  CL 90* 95* 99* 100* 105  CO2 19*  GLUCOSE 423* 337* 302* 230* 189*  BUN CREATININE 0.63 0.55 0.48 0.43* 0.53  CALCIUM 9.9 8.6* 8.1* 7.6* 6.9*    GFR: Estimated Creatinine Clearance: 75.4 mL/min (by C-G formula based on SCr of 0.53 mg/dL).  Liver Function Tests:  Recent Labs Lab 10/04/16 0216 10/05/16 0601 10/06/16 0342  AST ALT ALKPHOS 63 70 78  BILITOT 0.8 1.2 1.2  PROT 6.7 6.0* 5.4*  ALBUMIN 3.6 2.8* 2.3*    Recent Labs Lab 10/04/16 0216 10/05/16 0601  LIPASE 335* 126*   No results for input(s): AMMONIA in the last 168 hours.  Coagulation Profile:  Recent Labs Lab 10/05/16 0917  INR 1.03    Cardiac Enzymes: No results for input(s): CKTOTAL, CKMB, CKMBINDEX, TROPONINI in the last 168 hours.  BNP (last 3 results) No results for  input(s): PROBNP in the last 8760 hours.  HbA1C:  Recent Labs  10/04/16 0846  HGBA1C 13.7*    CBG:  Recent Labs Lab 10/05/16 1658 10/05/16 2002 10/06/16 0048 10/06/16 0431 10/06/16 0800  GLUCAP 208* 157* 173* 179* 174*    Lipid Profile:  Recent Labs  10/05/16 0601 10/06/16 0342  CHOL 522*  --   HDL NOT REPORTED DUE TO HIGH TRIGLYCERIDES  --   LDLCALC UNABLE TO CALCULATE IF TRIGLYCERIDE OVER 400 mg/dL  --   TRIG 1,610* 960*  CHOLHDL NOT REPORTED DUE TO HIGH TRIGLYCERIDES  --     Thyroid Function Tests: No results for input(s): TSH, T4TOTAL, FREET4, T3FREE, THYROIDAB in the last 72 hours.  Anemia Panel: No results for input(s): VITAMINB12, FOLATE, FERRITIN, TIBC, IRON, RETICCTPCT in the last 72 hours.  Urine analysis:    Component Value Date/Time   COLORURINE YELLOW 10/04/2016 1223   APPEARANCEUR CLEAR 10/04/2016 1223   LABSPEC 1.040 (H) 10/04/2016 1223   PHURINE 5.0 10/04/2016 1223   GLUCOSEU >=500 (A) 10/04/2016 1223   HGBUR MODERATE (A) 10/04/2016 1223   BILIRUBINUR NEGATIVE 10/04/2016  1223   KETONESUR 80 (A) 10/04/2016 1223   PROTEINUR NEGATIVE 10/04/2016 1223   UROBILINOGEN 0.2 01/03/2014 2001   NITRITE NEGATIVE 10/04/2016 1223   LEUKOCYTESUR NEGATIVE 10/04/2016 1223    Sepsis Labs: Lactic Acid, Venous    Component Value Date/Time   LATICACIDVEN 2.1 06/19/2011 1456    MICROBIOLOGY: No results found for this or any previous visit (from the past 240 hour(s)).  RADIOLOGY STUDIES/RESULTS: X-ray Chest Pa And Lateral  Result Date: 10/05/2016 CLINICAL DATA:  History of atrial fibrillation and pancreatitis. EXAM: CHEST  2 VIEW COMPARISON:  Chest radiograph 12/13/2015. FINDINGS: Monitoring leads overlie the patient. Stable cardiac and mediastinal contours. Low lung volumes. Heterogeneous opacities lung bases bilaterally. No definite pleural effusion or pneumothorax. Thoracic spine degenerative changes. IMPRESSION: Low lung volumes with basilar  opacities favored to represent atelectasis. Electronically Signed   By: Annia Belt M.D.   On: 10/05/2016 07:12   Ct Abdomen Pelvis W Contrast  Result Date: 10/04/2016 CLINICAL DATA:  Right-sided and lower abdominal pain with vomiting for a few hours ago. History of cholecystectomy. EXAM: CT ABDOMEN AND PELVIS WITH CONTRAST TECHNIQUE: Multidetector CT imaging of the abdomen and pelvis was performed using the standard protocol following bolus administration of intravenous contrast. CONTRAST:  100 ml  ISOVUE-300 IOPAMIDOL (ISOVUE-300) INJECTION 61% COMPARISON:  01/04/2014 FINDINGS: Lower chest: Linear scarring in the right lung base, possibly associated with surgical change. Atelectasis in the lung bases. Hepatobiliary: Diffuse fatty infiltration of the liver. No focal liver lesions identified. Gallbladder is surgically absent. Bile ducts are not dilated. Pancreas: There is fluid around the head of the pancreas extending into the gallbladder fossa and along the right anterior pararenal space. This appearance is suspicious for focal pancreatitis. No loculated collections. No evidence of pancreatic necrosis or pancreatic duct dilatation. Spleen: Normal in size without focal abnormality. Adrenals/Urinary Tract: Adrenal glands are unremarkable. Kidneys are normal, without renal calculi, focal lesion, or hydronephrosis. Bladder is unremarkable. Stomach/Bowel: Stomach, small bowel, and colon are not abnormally distended. No wall thickening is appreciated. Scattered diverticula in the colon without evidence of diverticulitis. There is not infraumbilical ventral abdominal wall hernia containing fat and transverse colon. No proximal obstruction. The hernia is new since previous study. Appendix is normal. Vascular/Lymphatic: Aortic atherosclerosis. No enlarged abdominal or pelvic lymph nodes. Reproductive: Uterus and bilateral adnexa are unremarkable. Other: Rounded calcification with central fat density in the left  anterior pelvis. No change since prior study. This likely represents calcified fat necrosis. No free air in the abdomen. Musculoskeletal: Degenerative changes in the spine. No destructive bone lesions. IMPRESSION: Edema around the head of the pancreas with extension into the gallbladder fossa and right anterior pararenal space. Changes are likely to represent focal pancreatitis. No loculated collection or pancreatic necrosis. Anterior abdominal wall hernia containing transverse colon. No obstruction. Diffuse fatty infiltration of the liver. Electronically Signed   By: Burman Nieves M.D.   On: 10/04/2016 01:46     LOS: 2 days   Jeoffrey Massed, MD  [facility] Pager:[phone]  If 7PM-7AM, please contact night-coverage [email or web address] Password [facility] 10/06/2016, 11:06 AM

## 2016-10-06 NOTE — Progress Notes (Signed)
Patient HR 130"s to 140's sustained for 5 minutes. Rechecked HR 84/  Patient asyptomatic and resting quietly   MD notified. No new orders noted. Heparin 15.5 ml/hr infusing.

## 2016-10-06 NOTE — Progress Notes (Signed)
Merdis Delay NP ordered  cardizem IV, med given, will continue to monitor.

## 2016-10-06 NOTE — Progress Notes (Signed)
   10/05/16 2346  Vitals  Temp 99.6 F (37.6 C)  Temp Source Oral  BP 104/62  MAP (mmHg) 71  BP Location Right Arm  BP Method Automatic  Patient Position (if appropriate) Lying  Pulse Rate (!) 132  Pulse Rate Source Dinamap  Resp 18  pt's HR in the 130's to 140's sustained, pt is asymptomatic, resting in bed, EKG done showing AFIB with RVR. Merdis Delay NP notified, awaiting call back at this time. Will continue to monitor.

## 2016-10-06 NOTE — Progress Notes (Signed)
Pt's potassium is 2.5. K. Schorr, NP notified and ordered potassium replacement IV.

## 2016-10-07 LAB — GLUCOSE, CAPILLARY
GLUCOSE-CAPILLARY: 128 mg/dL — AB (ref 65–99)
GLUCOSE-CAPILLARY: 150 mg/dL — AB (ref 65–99)
GLUCOSE-CAPILLARY: 206 mg/dL — AB (ref 65–99)
Glucose-Capillary: 121 mg/dL — ABNORMAL HIGH (ref 65–99)
Glucose-Capillary: 122 mg/dL — ABNORMAL HIGH (ref 65–99)
Glucose-Capillary: 177 mg/dL — ABNORMAL HIGH (ref 65–99)
Glucose-Capillary: 210 mg/dL — ABNORMAL HIGH (ref 65–99)

## 2016-10-07 LAB — CBC
HCT: 27.6 % — ABNORMAL LOW (ref 36.0–46.0)
HEMOGLOBIN: 9.2 g/dL — AB (ref 12.0–15.0)
MCH: 28.1 pg (ref 26.0–34.0)
MCHC: 33.3 g/dL (ref 30.0–36.0)
MCV: 84.4 fL (ref 78.0–100.0)
Platelets: 182 10*3/uL (ref 150–400)
RBC: 3.27 MIL/uL — ABNORMAL LOW (ref 3.87–5.11)
RDW: 12.8 % (ref 11.5–15.5)
WBC: 9.1 10*3/uL (ref 4.0–10.5)

## 2016-10-07 LAB — BASIC METABOLIC PANEL
Anion gap: 9 (ref 5–15)
BUN: 5 mg/dL — AB (ref 6–20)
CALCIUM: 7.2 mg/dL — AB (ref 8.9–10.3)
CO2: 20 mmol/L — ABNORMAL LOW (ref 22–32)
CREATININE: 0.55 mg/dL (ref 0.44–1.00)
Chloride: 108 mmol/L (ref 101–111)
GFR calc Af Amer: >60 mL/min (ref >60)
GLUCOSE: 147 mg/dL — AB (ref 65–99)
Potassium: 2.9 mmol/L — ABNORMAL LOW (ref 3.5–5.1)
SODIUM: 137 mmol/L (ref 135–145)

## 2016-10-07 LAB — HEPARIN LEVEL (UNFRACTIONATED)
HEPARIN UNFRACTIONATED: 0.29 [IU]/mL — AB (ref 0.30–0.70)
Heparin Unfractionated: 0.38 IU/mL (ref 0.30–0.70)

## 2016-10-07 LAB — TRIGLYCERIDES
TRIGLYCERIDES: 526 mg/dL — AB (ref <150)
TRIGLYCERIDES: 513 mg/dL — AB (ref <150)

## 2016-10-07 LAB — LDL CHOLESTEROL, DIRECT: Direct LDL: 217 mg/dL — ABNORMAL HIGH (ref 0–99)

## 2016-10-07 LAB — MAGNESIUM: Magnesium: 1.6 mg/dL — ABNORMAL LOW (ref 1.7–2.4)

## 2016-10-07 MED ORDER — PANTOPRAZOLE SODIUM 40 MG PO TBEC
40.0000 mg | DELAYED_RELEASE_TABLET | Freq: Two times a day (BID) | ORAL | Status: DC
  Administered 2016-10-07 – 2016-10-09 (×4): 40 mg via ORAL
  Filled 2016-10-07 (×4): qty 1

## 2016-10-07 MED ORDER — SODIUM CHLORIDE 0.9 % IV SOLN
30.0000 meq | INTRAVENOUS | Status: AC
  Administered 2016-10-07: 30 meq via INTRAVENOUS
  Filled 2016-10-07: qty 15

## 2016-10-07 MED ORDER — POTASSIUM CHLORIDE CRYS ER 20 MEQ PO TBCR
40.0000 meq | EXTENDED_RELEASE_TABLET | Freq: Once | ORAL | Status: AC
  Administered 2016-10-07: 40 meq via ORAL
  Filled 2016-10-07: qty 2

## 2016-10-07 MED ORDER — ATORVASTATIN CALCIUM 40 MG PO TABS
40.0000 mg | ORAL_TABLET | Freq: Every day | ORAL | Status: DC
Start: 2016-10-07 — End: 2016-10-09
  Administered 2016-10-07 – 2016-10-08 (×2): 40 mg via ORAL
  Filled 2016-10-07 (×2): qty 1

## 2016-10-07 MED ORDER — INSULIN ASPART 100 UNIT/ML ~~LOC~~ SOLN
0.0000 [IU] | Freq: Three times a day (TID) | SUBCUTANEOUS | Status: DC
  Administered 2016-10-08: 2 [IU] via SUBCUTANEOUS
  Administered 2016-10-08 (×2): 3 [IU] via SUBCUTANEOUS
  Administered 2016-10-09: 7 [IU] via SUBCUTANEOUS
  Administered 2016-10-09: 2 [IU] via SUBCUTANEOUS

## 2016-10-07 MED ORDER — GI COCKTAIL ~~LOC~~
30.0000 mL | Freq: Three times a day (TID) | ORAL | Status: DC | PRN
  Administered 2016-10-07: 30 mL via ORAL
  Filled 2016-10-07: qty 30

## 2016-10-07 MED ORDER — MAGNESIUM SULFATE 2 GM/50ML IV SOLN
2.0000 g | Freq: Once | INTRAVENOUS | Status: AC
  Administered 2016-10-07: 2 g via INTRAVENOUS
  Filled 2016-10-07: qty 50

## 2016-10-07 NOTE — Progress Notes (Signed)
ANTICOAGULATION CONSULT NOTE - Follow Up Consult  Pharmacy Consult for heparin Indication: atrial fibrillation  Labs:  Recent Labs  10/04/16 1231  10/05/16 0601 10/05/16 0917  10/06/16 0342 10/06/16 1207 10/06/16 1915 10/07/16 0209  HGB  --   < > 11.4*  --   --  9.8*  --   --  9.2*  HCT  --   --  33.6*  --   --  29.0*  --   --  27.6*  PLT  --   --  150  --   --  161  --   --  182  LABPROT  --   --   --  13.5  --   --   --   --   --   INR  --   --   --  1.03  --   --   --   --   --   HEPARINUNFRC  --   --   --   --   < > 0.19* 0.43 0.37 0.29*  CREATININE 0.48  --  0.43*  --   --  0.53  --   --   --   < > = values in this interval not displayed.   Assessment: 61yo female now slightly subtherapeutic on heparin after two level at goal though had been trending down.  Goal of Therapy:  Heparin level 0.3-0.7 units/ml   Plan:  Will increase heparin gtt slightly to 1650 units/hr and check level in 6hr.  Vernard Gambles, PharmD, BCPS  10/07/2016,3:17 AM

## 2016-10-07 NOTE — Progress Notes (Signed)
PROGRESS NOTE        PATIENT DETAILS Name: Emily Farmer Age: 61 y.o. Sex: female Date of Birth: 06-22-1955 Admit Date: 10/03/2016 Admitting Physician Levie Heritage, DO ZOX:[id number] Kathlynn Grate, MD  Brief Narrative: Patient is a 61 y.o. female with history of diabetes, hyperlipidemia, hypertension isn't into the ED for evaluation of epigastric pain, found to have acute pancreatitis. Further evaluation revealed a triglyceride level of 1392. Admitted for further evaluation and treatment, see below for further details.  Subjective: Epigastric pain has markedly improved today.  No loose stools this morning. No nausea vomiting No chest pain No headache No shortness of breath   Assessment/Plan: Acute pancreatitis: Likely secondary to hypertriglyceridemia, she has no history of alcohol use, she is status post cholecystectomy. She is markedly improved today-hardly any tenderness on my exam. Triglyceride levels down trending-plans are to start clear liquids and continue to follow. If she continues to improve and is able to tolerate diet, I suspect she should be able to go home in the next day or so.   Hypertriglyceridemia: Likely causing above-on fenofibrate and statin. Also on heparin infusion which should help with hypertriglyceridemia.   Atrial fibrillation with RVR: No further episodes of RVR (telemetry reviewed personally). Continue metoprolol and IV heparin-we will transition to oral agent when her diet is more stable. Note CHADS2VASC score of at least 3. Echo showed preserved EF.  Hypokalemia: Repleted repletion-magnesium levels low-we will replete magnesium as well.  Hypertension: Controlled with metoprolol.  Insulin-dependent type 2 diabetes: CBGs stable with 12 units of Lantus and SSI. Follow and adjust accordingly.   DVT Prophylaxis: Full dose anticoagulation with Heparin  Code Status: Full code  Family Communication: None at bedside  Disposition  Plan: Remain inpatient-home in 1-2 days  Antimicrobial agents: Anti-infectives    None     Procedures: Echo 4/22>>  - Normal LV wall thickness with LVEF 65-70% and grade 2 diastolic   dysfunction. Mild mitral regurgitation. Mildly calcified aortic   valve.Trivial tricuspid regurgitation with PASP 30 mmHg. Cannot   exclude PFO, could consider saline contrast evaluation if   clinical question remains.  CONSULTS:  None  Time spent: 25- minutes-Greater than 50% of this time was spent in counseling, explanation of diagnosis, planning of further management, and coordination of care.  MEDICATIONS: Scheduled Meds: . atorvastatin  20 mg Oral q1800  . fenofibrate  160 mg Oral Daily  . insulin aspart  0-9 Units Subcutaneous TID WC  . insulin glargine  12 Units Subcutaneous QHS  . metoprolol tartrate  25 mg Oral BID  . pantoprazole  40 mg Oral Q1200   Continuous Infusions: . sodium chloride 50 mL/hr at 10/06/16 0855  . heparin 1,650 Units/hr (10/07/16 0322)  . magnesium sulfate 1 - 4 g bolus IVPB 2 g (10/07/16 1011)   PRN Meds:.acetaminophen **OR** acetaminophen, bisacodyl, fentaNYL (SUBLIMAZE) injection, HYDROcodone-acetaminophen, HYDROmorphone (DILAUDID) injection, magnesium citrate, ondansetron **OR** ondansetron (ZOFRAN) IV, senna-docusate, traZODone   PHYSICAL EXAM: Vital signs: Vitals:   10/06/16 2034 10/06/16 2243 10/07/16 0425 10/07/16 0807  BP: (!) 106/49  (!) 91/58 126/61  Pulse: 85 84 62 83  Resp: 18   18  Temp: 98.8 F (37.1 C)  98.1 F (36.7 C)   TempSrc: Oral  Oral   SpO2: 94%  95% 96%  Weight:   80.1 kg (176 lb 8 oz)  Height:       Filed Weights   10/05/16 0451 10/06/16 0438 10/07/16 0425  Weight: 76.1 kg (167 lb 12.8 oz) 79.2 kg (174 lb 9.6 oz) 80.1 kg (176 lb 8 oz)   Body mass index is 31.27 kg/m.   General appearance :Awake, alert, not in any distress.  Eyes:, pupils equally reactive to light and accomodation,no scleral icterus. HEENT:  Atraumatic and Normocephalic Neck: supple, no JVD. Resp:Good air entry bilaterally, no added sounds CVS: S1 S2 regular, no murmurs.  GI: Bowel sounds present, very minimal tenderness in the epigastric area. No peritoneal signs. Extremities: B/L Lower Ext shows no edema, both legs are warm to touch Neurology:  speech clear,Non focal, sensation is grossly intact. Psychiatric: Normal judgment and insight. Normal mood. Musculoskeletal:No digital cyanosis Skin:No Rash, warm and dry Wounds:N/A  I have personally reviewed following labs and imaging studies  LABORATORY DATA: CBC:  Recent Labs Lab 10/03/16 2240 10/05/16 0601 10/06/16 0342 10/07/16 0209  WBC 12.1* 9.3 9.9 9.1  HGB 12.9 11.4* 9.8* 9.2*  HCT 36.1 33.6* 29.0* 27.6*  MCV 81.5 84.6 84.5 84.4  PLT 206 150 161 182    Basic Metabolic Panel:  Recent Labs Lab 10/04/16 0846 10/04/16 1231 10/05/16 0601 10/06/16 0342 10/06/16 1505 10/07/16 0209 10/07/16 0530  NA 133* 133* 134* 134*  --  137  --   K 3.3* 3.3* 3.0* 2.5* 3.1* 2.9*  --   CL 95* 99* 100* 105  --  108  --   CO2 19*  --  20*  --   GLUCOSE 337* 302* 230* 189*  --  147*  --   BUN --  5*  --   CREATININE 0.55 0.48 0.43* 0.53  --  0.55  --   CALCIUM 8.6* 8.1* 7.6* 6.9*  --  7.2*  --   MG  --   --   --   --   --   --  1.6*    GFR: Estimated Creatinine Clearance: 75.9 mL/min (by C-G formula based on SCr of 0.55 mg/dL).  Liver Function Tests:  Recent Labs Lab 10/04/16 0216 10/05/16 0601 10/06/16 0342  AST ALT ALKPHOS 63 70 78  BILITOT 0.8 1.2 1.2  PROT 6.7 6.0* 5.4*  ALBUMIN 3.6 2.8* 2.3*    Recent Labs Lab 10/04/16 0216 10/05/16 0601  LIPASE 335* 126*   No results for input(s): AMMONIA in the last 168 hours.  Coagulation Profile:  Recent Labs Lab 10/05/16 0917  INR 1.03    Cardiac Enzymes: No results for input(s): CKTOTAL, CKMB, CKMBINDEX, TROPONINI in the last 168 hours.  BNP (last 3  results) No results for input(s): PROBNP in the last 8760 hours.  HbA1C: No results for input(s): HGBA1C in the last 72 hours.  CBG:  Recent Labs Lab 10/06/16 2032 10/07/16 0010 10/07/16 0421 10/07/16 0646 10/07/16 0732  GLUCAP 134* 150* 121* 128* 122*    Lipid Profile:  Recent Labs  10/05/16 0601 10/06/16 0342 10/06/16 1505 10/07/16 0205  CHOL 522*  --   --   --   HDL NOT REPORTED DUE TO HIGH TRIGLYCERIDES  --   --   --   LDLCALC UNABLE TO CALCULATE IF TRIGLYCERIDE OVER 400 mg/dL  --   --   --   TRIG 1,610* 672*  --  526*  CHOLHDL NOT REPORTED DUE TO HIGH TRIGLYCERIDES  --   --   --  LDLDIRECT  --   --  86*  --     Thyroid Function Tests: No results for input(s): TSH, T4TOTAL, FREET4, T3FREE, THYROIDAB in the last 72 hours.  Anemia Panel: No results for input(s): VITAMINB12, FOLATE, FERRITIN, TIBC, IRON, RETICCTPCT in the last 72 hours.  Urine analysis:    Component Value Date/Time   COLORURINE YELLOW 10/04/2016 1223   APPEARANCEUR CLEAR 10/04/2016 1223   LABSPEC 1.040 (H) 10/04/2016 1223   PHURINE 5.0 10/04/2016 1223   GLUCOSEU >=500 (A) 10/04/2016 1223   HGBUR MODERATE (A) 10/04/2016 1223   BILIRUBINUR NEGATIVE 10/04/2016 1223   KETONESUR 80 (A) 10/04/2016 1223   PROTEINUR NEGATIVE 10/04/2016 1223   UROBILINOGEN 0.2 01/03/2014 2001   NITRITE NEGATIVE 10/04/2016 1223   LEUKOCYTESUR NEGATIVE 10/04/2016 1223    Sepsis Labs: Lactic Acid, Venous    Component Value Date/Time   LATICACIDVEN 2.1 06/19/2011 1456    MICROBIOLOGY: No results found for this or any previous visit (from the past 240 hour(s)).  RADIOLOGY STUDIES/RESULTS: X-ray Chest Pa And Lateral  Result Date: 10/05/2016 CLINICAL DATA:  History of atrial fibrillation and pancreatitis. EXAM: CHEST  2 VIEW COMPARISON:  Chest radiograph 12/13/2015. FINDINGS: Monitoring leads overlie the patient. Stable cardiac and mediastinal contours. Low lung volumes. Heterogeneous opacities lung bases  bilaterally. No definite pleural effusion or pneumothorax. Thoracic spine degenerative changes. IMPRESSION: Low lung volumes with basilar opacities favored to represent atelectasis. Electronically Signed   By: Annia Belt M.D.   On: 10/05/2016 07:12   Ct Abdomen Pelvis W Contrast  Result Date: 10/04/2016 CLINICAL DATA:  Right-sided and lower abdominal pain with vomiting for a few hours ago. History of cholecystectomy. EXAM: CT ABDOMEN AND PELVIS WITH CONTRAST TECHNIQUE: Multidetector CT imaging of the abdomen and pelvis was performed using the standard protocol following bolus administration of intravenous contrast. CONTRAST:  100 ml  ISOVUE-300 IOPAMIDOL (ISOVUE-300) INJECTION 61% COMPARISON:  01/04/2014 FINDINGS: Lower chest: Linear scarring in the right lung base, possibly associated with surgical change. Atelectasis in the lung bases. Hepatobiliary: Diffuse fatty infiltration of the liver. No focal liver lesions identified. Gallbladder is surgically absent. Bile ducts are not dilated. Pancreas: There is fluid around the head of the pancreas extending into the gallbladder fossa and along the right anterior pararenal space. This appearance is suspicious for focal pancreatitis. No loculated collections. No evidence of pancreatic necrosis or pancreatic duct dilatation. Spleen: Normal in size without focal abnormality. Adrenals/Urinary Tract: Adrenal glands are unremarkable. Kidneys are normal, without renal calculi, focal lesion, or hydronephrosis. Bladder is unremarkable. Stomach/Bowel: Stomach, small bowel, and colon are not abnormally distended. No wall thickening is appreciated. Scattered diverticula in the colon without evidence of diverticulitis. There is not infraumbilical ventral abdominal wall hernia containing fat and transverse colon. No proximal obstruction. The hernia is new since previous study. Appendix is normal. Vascular/Lymphatic: Aortic atherosclerosis. No enlarged abdominal or pelvic lymph  nodes. Reproductive: Uterus and bilateral adnexa are unremarkable. Other: Rounded calcification with central fat density in the left anterior pelvis. No change since prior study. This likely represents calcified fat necrosis. No free air in the abdomen. Musculoskeletal: Degenerative changes in the spine. No destructive bone lesions. IMPRESSION: Edema around the head of the pancreas with extension into the gallbladder fossa and right anterior pararenal space. Changes are likely to represent focal pancreatitis. No loculated collection or pancreatic necrosis. Anterior abdominal wall hernia containing transverse colon. No obstruction. Diffuse fatty infiltration of the liver. Electronically Signed   By: Marisa Cyphers.D.  On: 10/04/2016 01:46     LOS: 3 days   Jeoffrey Massed, MD  [facility] Pager:[phone]  If 7PM-7AM, please contact night-coverage [email or web address] Password [facility] 10/07/2016, 10:52 AM

## 2016-10-07 NOTE — Progress Notes (Addendum)
DAILY PROGRESS NOTE   Patient Name: Emily Farmer Date of Encounter: 10/07/2016  Hospital Problem List   Active Problems:   HTN (hypertension)   HLD (hyperlipidemia)   Diabetes mellitus type 2 in obese [facility])   Coronary atherosclerosis seen on CT   Acute pancreatitis   Paroxysmal atrial fibrillation [facility])    Chief Complaint   "I felt better this morning, now my stomach is burning"  Subjective   No events overnight. She is now back in sinus rhythm with occasional PVC's. Markedly improved epigastric pain - tolerated clear liquids yesterday.   Objective   Vitals:   10/06/16 2034 10/06/16 2243 10/07/16 0425 10/07/16 0807  BP: (!) 106/49  (!) 91/58 126/61  Pulse: 85 84 62 83  Resp: 18   18  Temp: 98.8 F (37.1 C)  98.1 F (36.7 C)   TempSrc: Oral  Oral   SpO2: 94%  95% 96%  Weight:   176 lb 8 oz (80.1 kg)   Height:        Intake/Output Summary (Last 24 hours) at 10/07/16 1104 Last data filed at 10/07/16 0424  Gross per 24 hour  Intake          1045.09 ml  Output                0 ml  Net          1045.09 ml   Filed Weights   10/05/16 0451 10/06/16 0438 10/07/16 0425  Weight: 167 lb 12.8 oz (76.1 kg) 174 lb 9.6 oz (79.2 kg) 176 lb 8 oz (80.1 kg)    Physical Exam   General appearance: alert and mild distress Lungs: clear to auscultation bilaterally Heart: regular rate and rhythm Abdomen: mild tenderness to palpation of the midepigastrium, no rebound or guarding Extremities: extremities normal, atraumatic, no cyanosis or edema Neurologic: Grossly normal  Inpatient Medications    Scheduled Meds: . atorvastatin  20 mg Oral q1800  . fenofibrate  160 mg Oral Daily  . insulin aspart  0-9 Units Subcutaneous TID WC  . insulin glargine  12 Units Subcutaneous QHS  . metoprolol tartrate  25 mg Oral BID  . pantoprazole  40 mg Oral Q1200    Continuous Infusions: . sodium chloride 50 mL/hr at 10/06/16 0855  . heparin 1,650 Units/hr (10/07/16 0322)  . magnesium  sulfate 1 - 4 g bolus IVPB 2 g (10/07/16 1011)    PRN Meds: acetaminophen **OR** acetaminophen, bisacodyl, fentaNYL (SUBLIMAZE) injection, HYDROcodone-acetaminophen, HYDROmorphone (DILAUDID) injection, magnesium citrate, ondansetron **OR** ondansetron (ZOFRAN) IV, senna-docusate, traZODone   Labs   Results for orders placed or performed during the hospital encounter of 10/03/16 (from the past 48 hour(s))  Glucose, capillary     Status: Abnormal   Collection Time: 10/05/16 11:44 AM  Result Value Ref Range   Glucose-Capillary 181 (H) 65 - 99 mg/dL   Comment 1 Notify RN    Comment 2 Document in Chart   Glucose, capillary     Status: Abnormal   Collection Time: 10/05/16  4:58 PM  Result Value Ref Range   Glucose-Capillary 208 (H) 65 - 99 mg/dL   Comment 1 Notify RN    Comment 2 Document in Chart   Heparin level (unfractionated)     Status: Abnormal   Collection Time: 10/05/16  6:16 PM  Result Value Ref Range   Heparin Unfractionated 0.12 (L) 0.30 - 0.70 IU/mL    Comment:        IF HEPARIN RESULTS  ARE BELOW EXPECTED VALUES, AND PATIENT DOSAGE HAS BEEN CONFIRMED, SUGGEST FOLLOW UP TESTING OF ANTITHROMBIN III LEVELS.   Glucose, capillary     Status: Abnormal   Collection Time: 10/05/16  8:02 PM  Result Value Ref Range   Glucose-Capillary 157 (H) 65 - 99 mg/dL  Glucose, capillary     Status: Abnormal   Collection Time: 10/06/16 12:48 AM  Result Value Ref Range   Glucose-Capillary 173 (H) 65 - 99 mg/dL  CBC     Status: Abnormal   Collection Time: 10/06/16  3:42 AM  Result Value Ref Range   WBC 9.9 4.0 - 10.5 K/uL   RBC 3.43 (L) 3.87 - 5.11 MIL/uL   Hemoglobin 9.8 (L) 12.0 - 15.0 g/dL   HCT 29.0 (L) 36.0 - 46.0 %   MCV 84.5 78.0 - 100.0 fL   MCH 28.6 26.0 - 34.0 pg   MCHC 33.8 30.0 - 36.0 g/dL   RDW 12.6 11.5 - 15.5 %   Platelets 161 150 - 400 K/uL  Comprehensive metabolic panel     Status: Abnormal   Collection Time: 10/06/16  3:42 AM  Result Value Ref Range   Sodium 134  (L) 135 - 145 mmol/L   Potassium 2.5 (LL) 3.5 - 5.1 mmol/L    Comment: CRITICAL RESULT CALLED TO, READ BACK BY AND VERIFIED WITH: J CDIJETIC,RN [phone] WILDERK    Chloride 105 101 - 111 mmol/L   CO2 19 (L) 22 - 32 mmol/L   Glucose, Bld 189 (H) 65 - 99 mg/dL   BUN 6 6 - 20 mg/dL   Creatinine, Ser 0.53 0.44 - 1.00 mg/dL   Calcium 6.9 (L) 8.9 - 10.3 mg/dL   Total Protein 5.4 (L) 6.5 - 8.1 g/dL   Albumin 2.3 (L) 3.5 - 5.0 g/dL   AST 22 15 - 41 U/L   ALT 16 14 - 54 U/L   Alkaline Phosphatase 78 38 - 126 U/L   Total Bilirubin 1.2 0.3 - 1.2 mg/dL   GFR calc non Af Amer >60 >60 mL/min   GFR calc Af Amer >60 >60 mL/min    Comment: (NOTE) The eGFR has been calculated using the CKD EPI equation. This calculation has not been validated in all clinical situations. eGFR's persistently <60 mL/min signify possible Chronic Kidney Disease.    Anion gap 10 5 - 15  Triglycerides     Status: Abnormal   Collection Time: 10/06/16  3:42 AM  Result Value Ref Range   Triglycerides 672 (H) <150 mg/dL  Heparin level (unfractionated)     Status: Abnormal   Collection Time: 10/06/16  3:42 AM  Result Value Ref Range   Heparin Unfractionated 0.19 (L) 0.30 - 0.70 IU/mL    Comment:        IF HEPARIN RESULTS ARE BELOW EXPECTED VALUES, AND PATIENT DOSAGE HAS BEEN CONFIRMED, SUGGEST FOLLOW UP TESTING OF ANTITHROMBIN III LEVELS.   Glucose, capillary     Status: Abnormal   Collection Time: 10/06/16  4:31 AM  Result Value Ref Range   Glucose-Capillary 179 (H) 65 - 99 mg/dL  Glucose, capillary     Status: Abnormal   Collection Time: 10/06/16  8:00 AM  Result Value Ref Range   Glucose-Capillary 174 (H) 65 - 99 mg/dL  Glucose, capillary     Status: Abnormal   Collection Time: 10/06/16 11:12 AM  Result Value Ref Range   Glucose-Capillary 156 (H) 65 - 99 mg/dL  Heparin level (unfractionated)  Status: None   Collection Time: 10/06/16 12:07 PM  Result Value Ref Range   Heparin Unfractionated 0.43  0.30 - 0.70 IU/mL    Comment:        IF HEPARIN RESULTS ARE BELOW EXPECTED VALUES, AND PATIENT DOSAGE HAS BEEN CONFIRMED, SUGGEST FOLLOW UP TESTING OF ANTITHROMBIN III LEVELS.   Potassium     Status: Abnormal   Collection Time: 10/06/16  3:05 PM  Result Value Ref Range   Potassium 3.1 (L) 3.5 - 5.1 mmol/L    Comment: NO VISIBLE HEMOLYSIS  LDL cholesterol, direct     Status: Abnormal   Collection Time: 10/06/16  3:05 PM  Result Value Ref Range   Direct LDL 217 (H) 0 - 99 mg/dL   Comment: Comment     Comment: (NOTE) Possible Familial Hypercholesterolemia. FH should be suspected when fasting LDL cholesterol is above 189 mg/dL or non-HDL cholesterol is above 219 mg/dL. A family history of high cholesterol and heart disease in 1st degree relatives should be collected. J Clin Lipidol 2011;5:133-140 Performed At: [facility] [place], [place] [id number] Lindon Romp MD EQ:[id number]   Glucose, capillary     Status: Abnormal   Collection Time: 10/06/16  4:32 PM  Result Value Ref Range   Glucose-Capillary 152 (H) 65 - 99 mg/dL  Heparin level (unfractionated)     Status: None   Collection Time: 10/06/16  7:15 PM  Result Value Ref Range   Heparin Unfractionated 0.37 0.30 - 0.70 IU/mL    Comment:        IF HEPARIN RESULTS ARE BELOW EXPECTED VALUES, AND PATIENT DOSAGE HAS BEEN CONFIRMED, SUGGEST FOLLOW UP TESTING OF ANTITHROMBIN III LEVELS.   Glucose, capillary     Status: Abnormal   Collection Time: 10/06/16  8:32 PM  Result Value Ref Range   Glucose-Capillary 134 (H) 65 - 99 mg/dL   Comment 1 Notify RN    Comment 2 Document in Chart   Glucose, capillary     Status: Abnormal   Collection Time: 10/07/16 12:10 AM  Result Value Ref Range   Glucose-Capillary 150 (H) 65 - 99 mg/dL   Comment 1 Notify RN    Comment 2 Document in Chart   Triglycerides     Status: Abnormal   Collection Time: 10/07/16  2:05 AM  Result Value Ref Range   Triglycerides 526  (H) <150 mg/dL  CBC     Status: Abnormal   Collection Time: 10/07/16  2:09 AM  Result Value Ref Range   WBC 9.1 4.0 - 10.5 K/uL   RBC 3.27 (L) 3.87 - 5.11 MIL/uL   Hemoglobin 9.2 (L) 12.0 - 15.0 g/dL   HCT 27.6 (L) 36.0 - 46.0 %   MCV 84.4 78.0 - 100.0 fL   MCH 28.1 26.0 - 34.0 pg   MCHC 33.3 30.0 - 36.0 g/dL   RDW 12.8 11.5 - 15.5 %   Platelets 182 150 - 400 K/uL  Heparin level (unfractionated)     Status: Abnormal   Collection Time: 10/07/16  2:09 AM  Result Value Ref Range   Heparin Unfractionated 0.29 (L) 0.30 - 0.70 IU/mL    Comment:        IF HEPARIN RESULTS ARE BELOW EXPECTED VALUES, AND PATIENT DOSAGE HAS BEEN CONFIRMED, SUGGEST FOLLOW UP TESTING OF ANTITHROMBIN III LEVELS.   Basic metabolic panel     Status: Abnormal   Collection Time: 10/07/16  2:09 AM  Result Value Ref Range   Sodium  137 135 - 145 mmol/L   Potassium 2.9 (L) 3.5 - 5.1 mmol/L   Chloride 108 101 - 111 mmol/L   CO2 20 (L) 22 - 32 mmol/L   Glucose, Bld 147 (H) 65 - 99 mg/dL   BUN 5 (L) 6 - 20 mg/dL   Creatinine, Ser 0.55 0.44 - 1.00 mg/dL   Calcium 7.2 (L) 8.9 - 10.3 mg/dL   GFR calc non Af Amer >60 >60 mL/min   GFR calc Af Amer >60 >60 mL/min    Comment: (NOTE) The eGFR has been calculated using the CKD EPI equation. This calculation has not been validated in all clinical situations. eGFR's persistently <60 mL/min signify possible Chronic Kidney Disease.    Anion gap 9 5 - 15  Glucose, capillary     Status: Abnormal   Collection Time: 10/07/16  4:21 AM  Result Value Ref Range   Glucose-Capillary 121 (H) 65 - 99 mg/dL   Comment 1 Notify RN    Comment 2 Document in Chart   Magnesium     Status: Abnormal   Collection Time: 10/07/16  5:30 AM  Result Value Ref Range   Magnesium 1.6 (L) 1.7 - 2.4 mg/dL  Glucose, capillary     Status: Abnormal   Collection Time: 10/07/16  6:46 AM  Result Value Ref Range   Glucose-Capillary 128 (H) 65 - 99 mg/dL  Glucose, capillary     Status: Abnormal    Collection Time: 10/07/16  7:32 AM  Result Value Ref Range   Glucose-Capillary 122 (H) 65 - 99 mg/dL  Heparin level (unfractionated)     Status: None   Collection Time: 10/07/16  9:38 AM  Result Value Ref Range   Heparin Unfractionated 0.38 0.30 - 0.70 IU/mL    Comment:        IF HEPARIN RESULTS ARE BELOW EXPECTED VALUES, AND PATIENT DOSAGE HAS BEEN CONFIRMED, SUGGEST FOLLOW UP TESTING OF ANTITHROMBIN III LEVELS.     ECG   None today  Telemetry   Sinus with PAC's - Personally Reviewed  Radiology   No results found.   Assessment   1. Active Problems: 2.   HTN (hypertension) 3.   HLD (hyperlipidemia) 4.   Diabetes mellitus type 2 in obese ([place]) 5.   Coronary atherosclerosis seen on CT 6.   Acute pancreatitis 7.   Paroxysmal atrial fibrillation (HCC) 8.   Plan   1. C/o recurrent mid-epigastric pain today. Would continue IV heparin until she can reliably take po. Would also consider adding Lovaza 4g daily to regimen when she can take po. Triglycerides are improving. Direct LDL is 219. Increase to moderate-intensity Lipitor 40 mg daily. Will monitor for possible myalgias which I am aware are more likely with this combination.  Will continue to follow with you.  Time Spent Directly with Patient:  15 minutes  Length of Stay:  LOS: 3 days   Pixie Casino, MD, [place]  Attending Cardiologist  Direct Dial: [phone]  Fax: [phone]  Website:  [email or web address] 10/07/2016, 11:04 AM

## 2016-10-07 NOTE — Progress Notes (Signed)
ANTICOAGULATION CONSULT NOTE - Follow Up Consult  Pharmacy Consult for heparin Indication: atrial fibrillation  No Known Allergies  Patient Measurements: Height:  (160 cm) Weight: 176 lb 8 oz (80.1 kg) (b scale) IBW/kg (Calculated) : 52.4  Vital Signs: Temp: 98.1 F (36.7 C) (04/24 0425) Temp Source: Oral (04/24 0425) BP: 126/61 (04/24 0807) Pulse Rate: 83 (04/24 0807)  Labs:  Recent Labs  10/05/16 0601 10/05/16 1610  10/06/16 0342  10/06/16 1915 10/07/16 0209 10/07/16 0938  HGB 11.4*  --   --  9.8*  --   --  9.2*  --   HCT 33.6*  --   --  29.0*  --   --  27.6*  --   PLT 150  --   --  161  --   --  182  --   LABPROT  --  13.5  --   --   --   --   --   --   INR  --  1.03  --   --   --   --   --   --   HEPARINUNFRC  --   --   < > 0.19*  < > 0.37 0.29* 0.38  CREATININE 0.43*  --   --  0.53  --   --  0.55  --   < > = values in this interval not displayed.  Estimated Creatinine Clearance: 75.9 mL/min (by C-G formula based on SCr of 0.55 mg/dL).   Medications:  Scheduled:  . atorvastatin  40 mg Oral q1800  . fenofibrate  160 mg Oral Daily  . insulin aspart  0-9 Units Subcutaneous TID WC  . insulin glargine  12 Units Subcutaneous QHS  . metoprolol tartrate  25 mg Oral BID  . pantoprazole  40 mg Oral Q1200    Assessment: 60yo female on heparin for AFib.  Currently pt with pancreatitis due to hypertriglyceridemia and now on clear liquids.  Plan to transition to DOAC once able to tolerate POs.    Heparin level is therapeutic on current rate with Hg stable and pltc wnl.  No bleeding noted.  Goal of Therapy:  Heparin level 0.3-0.7 units/ml Monitor platelets by anticoagulation protocol: Yes   Plan:  Continue heparin at 1650 units/hr Daily heparin level and CBC Watch for s/s of bleeding   Marisue Humble, PharmD Clinical Pharmacist [facility]

## 2016-10-08 LAB — CBC
HCT: 27.9 % — ABNORMAL LOW (ref 36.0–46.0)
HEMOGLOBIN: 9.7 g/dL — AB (ref 12.0–15.0)
MCH: 29.1 pg (ref 26.0–34.0)
MCHC: 34.8 g/dL (ref 30.0–36.0)
MCV: 83.8 fL (ref 78.0–100.0)
Platelets: 200 10*3/uL (ref 150–400)
RBC: 3.33 MIL/uL — ABNORMAL LOW (ref 3.87–5.11)
RDW: 13.3 % (ref 11.5–15.5)
WBC: 8.2 10*3/uL (ref 4.0–10.5)

## 2016-10-08 LAB — GLUCOSE, CAPILLARY
GLUCOSE-CAPILLARY: 189 mg/dL — AB (ref 65–99)
GLUCOSE-CAPILLARY: 192 mg/dL — AB (ref 65–99)
GLUCOSE-CAPILLARY: 200 mg/dL — AB (ref 65–99)
GLUCOSE-CAPILLARY: 225 mg/dL — AB (ref 65–99)
Glucose-Capillary: 181 mg/dL — ABNORMAL HIGH (ref 65–99)
Glucose-Capillary: 215 mg/dL — ABNORMAL HIGH (ref 65–99)

## 2016-10-08 LAB — BASIC METABOLIC PANEL
ANION GAP: 9 (ref 5–15)
CO2: 22 mmol/L (ref 22–32)
Calcium: 7.5 mg/dL — ABNORMAL LOW (ref 8.9–10.3)
Chloride: 101 mmol/L (ref 101–111)
Creatinine, Ser: 0.43 mg/dL — ABNORMAL LOW (ref 0.44–1.00)
GFR calc Af Amer: >60 mL/min (ref >60)
GLUCOSE: 191 mg/dL — AB (ref 65–99)
POTASSIUM: 3.1 mmol/L — AB (ref 3.5–5.1)
Sodium: 132 mmol/L — ABNORMAL LOW (ref 135–145)

## 2016-10-08 LAB — MAGNESIUM: Magnesium: 1.6 mg/dL — ABNORMAL LOW (ref 1.7–2.4)

## 2016-10-08 LAB — HEPARIN LEVEL (UNFRACTIONATED): HEPARIN UNFRACTIONATED: 0.35 [IU]/mL (ref 0.30–0.70)

## 2016-10-08 LAB — TRIGLYCERIDES: Triglycerides: 374 mg/dL — ABNORMAL HIGH (ref <150)

## 2016-10-08 MED ORDER — POTASSIUM CHLORIDE CRYS ER 20 MEQ PO TBCR
20.0000 meq | EXTENDED_RELEASE_TABLET | Freq: Once | ORAL | Status: AC
  Administered 2016-10-08: 20 meq via ORAL
  Filled 2016-10-08: qty 1

## 2016-10-08 MED ORDER — POTASSIUM CHLORIDE CRYS ER 10 MEQ PO TBCR
EXTENDED_RELEASE_TABLET | ORAL | Status: AC
  Filled 2016-10-08: qty 2

## 2016-10-08 MED ORDER — MAGNESIUM SULFATE 2 GM/50ML IV SOLN
2.0000 g | Freq: Once | INTRAVENOUS | Status: AC
  Administered 2016-10-08: 2 g via INTRAVENOUS
  Filled 2016-10-08: qty 50

## 2016-10-08 MED ORDER — APIXABAN 5 MG PO TABS
5.0000 mg | ORAL_TABLET | Freq: Two times a day (BID) | ORAL | Status: DC
  Administered 2016-10-08 – 2016-10-09 (×3): 5 mg via ORAL
  Filled 2016-10-08 (×3): qty 1

## 2016-10-08 MED ORDER — FUROSEMIDE 10 MG/ML IJ SOLN
20.0000 mg | Freq: Once | INTRAMUSCULAR | Status: AC
  Administered 2016-10-08: 20 mg via INTRAVENOUS
  Filled 2016-10-08: qty 2

## 2016-10-08 MED ORDER — FUROSEMIDE 10 MG/ML IJ SOLN
20.0000 mg | Freq: Once | INTRAMUSCULAR | Status: DC
  Filled 2016-10-08: qty 2

## 2016-10-08 MED ORDER — POTASSIUM CHLORIDE CRYS ER 20 MEQ PO TBCR
40.0000 meq | EXTENDED_RELEASE_TABLET | Freq: Once | ORAL | Status: AC
  Administered 2016-10-08: 20 meq via ORAL
  Filled 2016-10-08: qty 2

## 2016-10-08 MED ORDER — METOPROLOL TARTRATE 25 MG PO TABS: 25.0000 mg | ORAL_TABLET | ORAL | Status: AC

## 2016-10-08 NOTE — Progress Notes (Signed)
Chart reviewed, including Dr. Windell [place] note. I agree with plan to transition to Eliquis. No further cardiac suggestions at this point. I'm happy to see in follow-up for ongoing management of her dyslipidemia. Will sign-off for now. Call with questions.  Chrystie Nose, MD, [facility]  [place]  [facility]  Attending Cardiologist  Direct Dial: [phone]  Fax: [phone]  Website:  [email or web address]

## 2016-10-08 NOTE — Progress Notes (Signed)
PROGRESS NOTE  Emily Farmer RUE:[id number] DOB: 10/29/1955 DOA: 10/03/2016 PCP: Dorrene German, MD   LOS: 4 days   Brief Narrative: Patient is a 61 y.o. female with history of diabetes, hyperlipidemia, hypertension isn't into the ED for evaluation of epigastric pain, found to have acute pancreatitis 2/2 hypertriglyceridemia. Admitted for further evaluation and treatment, see below for further details.  Subjective: Patient was seen and examined at bedside. Appears well, in no acute distress. Discussed options of blood thinners given her hx of HLD, DM, and HTN and risk of clots/stroke, pt requests to be started on Eliquis. No complaints of N/V/D, CP, SOB, or HA.  Assessment & Plan: Active Problems:   HTN (hypertension)   HLD (hyperlipidemia)   Diabetes mellitus type 2 in obese [facility])   Coronary atherosclerosis seen on CT   Acute pancreatitis   Paroxysmal A-fib (HCC)  Acute pancreatitis: Likely secondary to hypertriglyceridemia. Pt has no hx of etoh abuse, she is status post cholecystectomy.  She continues to show clinical improvement, does not have any significant tenderness upon palpation of abdomen, TG levels continue to trend down. Doing well on clear liquids, will start her on full liquids today and continue to follow. If she is able to tolerate full liquids, pt should be able to ready for dispo tomorrow.  Hypertriglyceridemia: Likely causing Acute pancreatitis in patient. Currently managed on fenofibrate and statin. Also was on heparin infusion which should help with hypertriglyceridemia.   Atrial fibrillation with RVR: No further episodes of RVR (telemetry reviewed personally). Continue metoprolol,as diet is now stable-will transition from IV heparin to Eliquis (pt prefers this over Xarelto and Coumadin).Note CHADS2VASC score of at least 3. Echo showed preserved EF.  Hypokalemia:Continue to replete K and Mg-follow lytes  Hypertension: Controlled with  metoprolol.  Insulin-dependent type 2 diabetes: CBGs stable with 12 units of Lantus and SSI. Follow and adjust accordingly. Resume home meds on discharge.  DVT prophylaxis: Heparin Code Status: FULL Family Communication: None at bedside Disposition Plan: Home within 1-2 days  Consultants:   None  Procedures:   Echo 4/21: Normal LV wall thickness with LVEF 65-70% and grade 2 diastolic dysfunction. Mild mitral regurgitation. Mildly calcified aortic valve.Trivial tricuspid regurgitation with PASP 30 mmHg. Cannot exclude PFO, could consider saline contrast evaluation if clinical question remains.   Antimicrobials:  None  Time spent: 25- minutes-Greater than 50% of this time was spent in counseling, explanation of diagnosis, planning of further management, and coordination of care.  Objective: Vitals:   10/07/16 1528 10/07/16 2108 10/08/16 0020 10/08/16 0415  BP: 129/63 138/62 (!) 118/55 122/67  Pulse: 85 86 80 70  Resp: 18 17    Temp: 98.5 F (36.9 C) 98.5 F (36.9 C)  98 F (36.7 C)  TempSrc: Oral Oral  Oral  SpO2: 97% 95% 95% 97%  Weight:    80.6 kg (177 lb 12.8 oz)  Height:        Intake/Output Summary (Last 24 hours) at 10/08/16 0924 Last data filed at 10/08/16 0826  Gross per 24 hour  Intake              640 ml  Output             1100 ml  Net             -460 ml   Filed Weights   10/06/16 0438 10/07/16 0425 10/08/16 0415  Weight: 79.2 kg (174 lb 9.6 oz) 80.1 kg (176 lb 8 oz) 80.6 kg (177  lb 12.8 oz)    Examination: Constitutional: NAD Vitals:   10/07/16 1528 10/07/16 2108 10/08/16 0020 10/08/16 0415  BP: 129/63 138/62 (!) 118/55 122/67  Pulse: 85 86 80 70  Resp: 18 17    Temp: 98.5 F (36.9 C) 98.5 F (36.9 C)  98 F (36.7 C)  TempSrc: Oral Oral  Oral  SpO2: 97% 95% 95% 97%  Weight:    80.6 kg (177 lb 12.8 oz)  Height:        Respiratory: clear to auscultation bilaterally Cardiovascular: S1 and S2 heard, no obvious murmurs Abdomen: very  minimal tenderness in epigastric region. Bowel sounds positive.  Extremeties: No LE edema bilaterally Skin: no obvious lesions Psychiatric: Normal judgment and insight. Alert and oriented x 3. Normal mood.    Data Reviewed: I have personally reviewed following labs and imaging studies  CBC:  Recent Labs Lab 10/03/16 2240 10/05/16 0601 10/06/16 0342 10/07/16 0209 10/08/16 0521  WBC 12.1* 9.3 9.9 9.1 8.2  HGB 12.9 11.4* 9.8* 9.2* 9.7*  HCT 36.1 33.6* 29.0* 27.6* 27.9*  MCV 81.5 84.6 84.5 84.4 83.8  PLT 206 150 161 182 200   Basic Metabolic Panel:  Recent Labs Lab 10/04/16 1231 10/05/16 0601 10/06/16 0342 10/06/16 1505 10/07/16 0209 10/07/16 0530 10/08/16 0521  NA 133* 134* 134*  --  137  --  132*  K 3.3* 3.0* 2.5* 3.1* 2.9*  --  3.1*  CL 99* 100* 105  --  108  --  101  CO2 24 23 19*  --  20*  --  22  GLUCOSE 302* 230* 189*  --  147*  --  191*  BUN --  5*  --  <5*  CREATININE 0.48 0.43* 0.53  --  0.55  --  0.43*  CALCIUM 8.1* 7.6* 6.9*  --  7.2*  --  7.5*  MG  --   --   --   --   --  1.6* 1.6*   GFR: Estimated Creatinine Clearance: 76.1 mL/min (A) (by C-G formula based on SCr of 0.43 mg/dL (L)). Liver Function Tests:  Recent Labs Lab 10/04/16 0216 10/05/16 0601 10/06/16 0342  AST ALT ALKPHOS 63 70 78  BILITOT 0.8 1.2 1.2  PROT 6.7 6.0* 5.4*  ALBUMIN 3.6 2.8* 2.3*    Recent Labs Lab 10/04/16 0216 10/05/16 0601  LIPASE 335* 126*   No results for input(s): AMMONIA in the last 168 hours. Coagulation Profile:  Recent Labs Lab 10/05/16 0917  INR 1.03   Cardiac Enzymes: No results for input(s): CKTOTAL, CKMB, CKMBINDEX, TROPONINI in the last 168 hours. BNP (last 3 results) No results for input(s): PROBNP in the last 8760 hours. HbA1C: No results for input(s): HGBA1C in the last 72 hours. CBG:  Recent Labs Lab 10/07/16 1615 10/07/16 2008 10/08/16 0012 10/08/16 0412 10/08/16 0741  GLUCAP 206* 210* 200* 189* 181*    Lipid Profile:  Recent Labs  10/06/16 1505  10/07/16 1113 10/08/16 0521  TRIG  --   < > 513* 374*  LDLDIRECT 217*  --   --   --   < > = values in this interval not displayed. Thyroid Function Tests: No results for input(s): TSH, T4TOTAL, FREET4, T3FREE, THYROIDAB in the last 72 hours. Anemia Panel: No results for input(s): VITAMINB12, FOLATE, FERRITIN, TIBC, IRON, RETICCTPCT in the last 72 hours. Urine analysis:    Component Value Date/Time   COLORURINE YELLOW 10/04/2016 1223  APPEARANCEUR CLEAR 10/04/2016 1223   LABSPEC 1.040 (H) 10/04/2016 1223   PHURINE 5.0 10/04/2016 1223   GLUCOSEU >=500 (A) 10/04/2016 1223   HGBUR MODERATE (A) 10/04/2016 1223   BILIRUBINUR NEGATIVE 10/04/2016 1223   KETONESUR 80 (A) 10/04/2016 1223   PROTEINUR NEGATIVE 10/04/2016 1223   UROBILINOGEN 0.2 01/03/2014 2001   NITRITE NEGATIVE 10/04/2016 1223   LEUKOCYTESUR NEGATIVE 10/04/2016 1223   Sepsis Labs: Invalid input(s): PROCALCITONIN, LACTICIDVEN  No results found for this or any previous visit (from the past 240 hour(s)).    Radiology Studies: No results found.   Scheduled Meds: . atorvastatin  40 mg Oral q1800  . fenofibrate  160 mg Oral Daily  . insulin aspart  0-9 Units Subcutaneous TID WC  . insulin glargine  12 Units Subcutaneous QHS  . metoprolol tartrate  25 mg Oral BID  . metoprolol tartrate  25 mg Oral NOW  . pantoprazole  40 mg Oral BID AC   Continuous Infusions: . sodium chloride 10 mL/hr at 10/07/16 1128  . heparin 1,650 Units/hr (10/08/16 0558)   Floreen Comber, PA-S  If 7PM-7AM, please contact night-coverage [email or web address] Password [facility] 10/08/2016, 9:24 AM       Attending MD note  Patient was seen, examined,treatment plan was discussed with the PA-S.  I have personally reviewed the clinical findings, lab, imaging studies and management of this patient in detail. I agree with the documentation, as recorded by the PA-S  Patient is doing much better.  Her abdominal pain has markedly decreased. Tolerating clear liquids.  Telemetry (Personally reviewed): Normal sinus rhythm-but did have a few episodes of A. fib with RVR last night  On Exam: Vitals:   10/07/16 2108 10/08/16 0020 10/08/16 0415 10/08/16 1000  BP: 138/62 (!) 118/55 122/67 124/69  Pulse: 86 80 70 84  Resp: 17     Temp: 98.5 F (36.9 C)  98 F (36.7 C)   TempSrc: Oral  Oral   SpO2: 95% 95% 97%   Weight:   80.6 kg (177 lb 12.8 oz)   Height:       Gen. exam: Awake, alert, not in any distress Chest: Good air entry bilaterally, no rhonchi or rales CVS: S1-S2 regular, no murmurs Abdomen: Soft, nontender and nondistended Neurology: Non-focal Extremity: Trace edema Skin: No rash or lesions   Lab Data: CBC:  Recent Labs Lab 10/03/16 2240 10/05/16 0601 10/06/16 0342 10/07/16 0209 10/08/16 0521  WBC 12.1* 9.3 9.9 9.1 8.2  HGB 12.9 11.4* 9.8* 9.2* 9.7*  HCT 36.1 33.6* 29.0* 27.6* 27.9*  MCV 81.5 84.6 84.5 84.4 83.8  PLT 206 150 161 182 200    Basic Metabolic Panel:  Recent Labs Lab 10/04/16 1231 10/05/16 0601 10/06/16 0342 10/06/16 1505 10/07/16 0209 10/07/16 0530 10/08/16 0521  NA 133* 134* 134*  --  137  --  132*  K 3.3* 3.0* 2.5* 3.1* 2.9*  --  3.1*  CL 99* 100* 105  --  108  --  101  CO2 24 23 19*  --  20*  --  22  GLUCOSE 302* 230* 189*  --  147*  --  191*  BUN --  5*  --  <5*  CREATININE 0.48 0.43* 0.53  --  0.55  --  0.43*  CALCIUM 8.1* 7.6* 6.9*  --  7.2*  --  7.5*  MG  --   --   --   --   --  1.6* 1.6*    GFR: Estimated  Creatinine Clearance: 76.1 mL/min (A) (by C-G formula based on SCr of 0.43 mg/dL (L)).  Liver Function Tests:  Recent Labs Lab 10/04/16 0216 10/05/16 0601 10/06/16 0342  AST ALT ALKPHOS 63 70 78  BILITOT 0.8 1.2 1.2  PROT 6.7 6.0* 5.4*  ALBUMIN 3.6 2.8* 2.3*    Recent Labs Lab 10/04/16 0216 10/05/16 0601  LIPASE 335* 126*   No results for input(s): AMMONIA in the last 168  hours.  Coagulation Profile:  Recent Labs Lab 10/05/16 0917  INR 1.03   Impression: Pancreatitis: Essentially resolved Hypertriglyceridemia: Improved PAF with RVR DM-2 Hypokalemia/hypomagnesemia   Plan Advance to full liquids Stop IV heparin-discussed options of anticoagulation-patient prefers Eliquis Replace electrolytes. Continue Statin/Fibrates Rest as above  Rest as above  Brewing technologist [facility]

## 2016-10-08 NOTE — Plan of Care (Signed)
Problem: Nutrition: Goal: Adequate nutrition will be maintained Outcome: Progressing Diet advancing to full liquid today with goal of solid food tomorrow

## 2016-10-08 NOTE — Progress Notes (Signed)
1st dose Eliquis given and Heparin IV stopped per orders.  Pharmacist discussed Eliquis with patient and she stated understanding. Patient updated on plan of care with anticipated discharge tomorrow.

## 2016-10-08 NOTE — Progress Notes (Addendum)
Pt in another episode of A fib with RVR in the 130s-180s. Too early to give AM Metoprolol. MD paged.    Pt converted back to SR in the 80s-90s. NOW Metoprolol dose held. Will continue to monitor.

## 2016-10-08 NOTE — Progress Notes (Signed)
Pt converted to A fib with RVR up to the 160s as pt started getting up to the bedside commode. Heart rate remained elevated for awhile after pt returned to bed for rest. PO Metoprolol given. Pt converted back to NSR in the 80s-90s. Will continue to monitor. 

## 2016-10-08 NOTE — Care Management Note (Signed)
Case Management Note  Patient Details  Name: Emily Farmer MRN: [id number] Date of Birth: 04-17-1956  Subjective/Objective:      Admitted with Acute Pancreatitis             Action/Plan: Patient is independent as prior to admission, she is starting a new job and will have medical insurance with her new job; PCP: Dorrene German, MD; pharmacy of choice is [facility]; Eliquis coupon card given to patient with instructions of usage; also CM encouraged patient to call the Eliquis medication assistance program to see if she will qualify for ongoing assistance.  Expected Discharge Date:     10/09/2016             Expected Discharge Plan:  Home/Self Care  In-House Referral:   Financial Counselor  Discharge planning Services  CM Consult    Status of Service:  In process, will continue to follow  Reola Mosher [phone] 10/08/2016, 1:04 PM

## 2016-10-08 NOTE — Progress Notes (Signed)
ANTICOAGULATION CONSULT NOTE - Follow Up Consult  Pharmacy Consult for heparin, transition to Eliquis Indication: atrial fibrillation  No Known Allergies  Patient Measurements: Height:  (160 cm) Weight: 177 lb 12.8 oz (80.6 kg) (b scale) IBW/kg (Calculated) : 52.4  Vital Signs: Temp: 98 F (36.7 C) (04/25 0415) Temp Source: Oral (04/25 0415) BP: 122/67 (04/25 0415) Pulse Rate: 70 (04/25 0415)  Labs:  Recent Labs  10/06/16 0342  10/07/16 0209 10/07/16 0938 10/08/16 0521  HGB 9.8*  --  9.2*  --  9.7*  HCT 29.0*  --  27.6*  --  27.9*  PLT 161  --  182  --  200  HEPARINUNFRC 0.19*  < > 0.29* 0.38 0.35  CREATININE 0.53  --  0.55  --  0.43*  < > = values in this interval not displayed.  Estimated Creatinine Clearance: 76.1 mL/min (A) (by C-G formula based on SCr of 0.43 mg/dL (L)).   Medications:  Scheduled:  . atorvastatin  40 mg Oral q1800  . fenofibrate  160 mg Oral Daily  . insulin aspart  0-9 Units Subcutaneous TID WC  . insulin glargine  12 Units Subcutaneous QHS  . metoprolol tartrate  25 mg Oral BID  . metoprolol tartrate  25 mg Oral NOW  . pantoprazole  40 mg Oral BID AC    Assessment: 61yo female on heparin for AFib.  Currently pt with pancreatitis due to hypertriglyceridemia, diet advanced to full liquids- to start Eliquis and d/c heparin.  Heparin level is therapeuticthis AM on current rate with Hg stable and pltc wnl.  No bleeding noted.  Goal of Therapy:  Heparin level 0.3-0.7 units/ml Monitor platelets by anticoagulation protocol: Yes   Plan:  D/C heparin and all heparin labs Eliquis  po bid Watch for s/s of bleeding   Marisue Humble, PharmD Clinical Pharmacist [facility]

## 2016-10-09 LAB — GLUCOSE, CAPILLARY
GLUCOSE-CAPILLARY: 176 mg/dL — AB (ref 65–99)
GLUCOSE-CAPILLARY: 302 mg/dL — AB (ref 65–99)

## 2016-10-09 MED ORDER — PANTOPRAZOLE SODIUM 40 MG PO TBEC: 40.0000 mg | DELAYED_RELEASE_TABLET | Freq: Every day | ORAL | 0 refills | Status: DC

## 2016-10-09 MED ORDER — METOPROLOL TARTRATE 25 MG PO TABS: 25.0000 mg | ORAL_TABLET | Freq: Two times a day (BID) | ORAL | 0 refills | Status: DC

## 2016-10-09 MED ORDER — FENOFIBRATE 160 MG PO TABS: 160.0000 mg | ORAL_TABLET | Freq: Every day | ORAL | 0 refills | Status: DC

## 2016-10-09 MED ORDER — ATORVASTATIN CALCIUM 10 MG PO TABS: 10.0000 mg | ORAL_TABLET | Freq: Every day | ORAL | 0 refills | Status: DC

## 2016-10-09 MED ORDER — INSULIN GLARGINE 100 UNIT/ML ~~LOC~~ SOLN: 12.0000 [IU] | Freq: Every day | SUBCUTANEOUS | 0 refills | Status: DC

## 2016-10-09 MED ORDER — APIXABAN 5 MG PO TABS: 5.0000 mg | ORAL_TABLET | Freq: Two times a day (BID) | ORAL | 0 refills | Status: DC

## 2016-10-09 NOTE — Progress Notes (Signed)
1346 pt d/c off floor after given d/c instructions explained and given to pt.  Verbalized understanaing.  D/c off floor to awaiting transport.  Raahi Korber, CharAmanda Pearaiser.

## 2016-10-09 NOTE — Progress Notes (Signed)
Pt continues to have non-radiating right elbow pain. No swelling. PRN pain meds given.

## 2016-10-09 NOTE — Discharge Summary (Signed)
PATIENT DETAILS Name: Emily Farmer Age: 61 y.o. Sex: female Date of Birth: [birth year] MRN: [id number]. Admitting Physician: Maretta Bees, MD WUJ:[id number] Kathlynn Grate, MD  Admit Date: 10/03/2016 Discharge date: 10/09/2016  Recommendations for Outpatient Follow-up:  1. Follow up with PCP in 1-2 weeks 2. Please obtain BMP/CBC in one week 3. Please repeat lipid panel in the next 3-6 months. Please ensure follow-up with cardiology.   Admitted From:  Home  Disposition: Home    Home Health: No  Equipment/Devices: None  Discharge Condition: Stable  CODE STATUS: FULL CODE  Diet recommendation:  Heart Healthy / Carb Modified  Brief Summary: See H&P, Labs, Consult and Test reports for all details in brief,Patient is a 61 y.o.femalewith history of diabetes, hyperlipidemia, hypertension isn't into the ED for evaluation of epigastric pain, found to have acute pancreatitis 2/2 hypertriglyceridemia. Admitted for further evaluation and treatment, see below for further details  Brief Hospital Course: Acute pancreatitis: Likely secondary to hypertriglyceridemia. Pt has no hx of etoh abuse, she is status post cholecystectomy. She was managed with supportive measures, kept nothing by mouth for a few days, diet was slowly advanced. By day of discharge able to tolerate a soft diet. She was encouraged to eat a low-fat diet on discharge. Her triglycerides slowly trended down with initiation of fenofibrate therapy and heparin infusion. Her abdomen is soft and nontender this morning. Since clinically improved, tolerating diet-she is stable to be discharged home later today. She was asked to follow-up with her primary care M.D. in the next week or so for follow-up post hospital discharge follow-up visit.    Hypertriglyceridemia:Likely causing Acute pancreatitis in patient. Currently managed on fenofibrate and statin. Also was on heparin infusion which should help with hypertriglyceridemia.    Atrial fibrillation with RVR: Hospital course characterized by intermittent episodes of RVR. Currently still back in sinus rhythm, on metoprolol. She was initially maintained on IV heparin, but transitioned to Eliquis (pt prefers this over Xarelto and Coumadin).NoteCHADS2VASC score of at least 3. Echo showed preserved EF. Seen by cardiology this hospitalization.  Hypokalemia:Continue to replete K and Mg-follow lytes  Hypertension:Controlled with metoprolol.  Insulin-dependent type 2 diabetes:CBGs stable I can continue with 12units of Lantus daily at bedtime and metformin on discharge  Mild right elbow pain: No concerning findings on exam-no erythema or swelling-asked patient to continue supportive measures with as needed Tylenol, hot/cold compresses. Patient was told to seek opinion from PCP if her right elbow pain did not resolve with supportive measures.  Procedures/Studies:  Echo 4/21: Normal LV wall thickness with LVEF 65-70% and grade 2 diastolic dysfunction. Mild mitral regurgitation. Mildly calcified aortic valve.Trivial tricuspid regurgitation with PASP 30 mmHg. Cannot exclude PFO, could consider saline contrast evaluation if clinical question remains.  Discharge Diagnoses:  Active Problems:   HTN (hypertension)   HLD (hyperlipidemia)   Diabetes mellitus type 2 in obese [facility])   Coronary atherosclerosis seen on CT   Acute pancreatitis   Paroxysmal A-fib [facility])   Discharge Instructions:  Activity:  As tolerated with Full fall precautions use walker/cane & assistance as needed  Discharge Instructions    Diet - low sodium heart healthy    Complete by:  As directed    Diet Carb Modified    Complete by:  As directed    Increase activity slowly    Complete by:  As directed      Allergies as of 10/09/2016   No Known Allergies     Medication List  TAKE these medications   apixaban 5 MG Tabs tablet Commonly known as:  ELIQUIS Take 1 tablet (5 mg total) by  mouth 2 (two) times daily.   atorvastatin 10 MG tablet Commonly known as:  LIPITOR Take 1 tablet (10 mg total) by mouth daily.   fenofibrate 160 MG tablet Take 1 tablet (160 mg total) by mouth daily.   insulin glargine 100 UNIT/ML injection Commonly known as:  LANTUS Inject 0.12 mLs (12 Units total) into the skin at bedtime. What changed:  how much to take   metFORMIN 500 MG tablet Commonly known as:  GLUCOPHAGE Take 500 mg by mouth 2 (two) times daily with a meal.   metoprolol tartrate 25 MG tablet Commonly known as:  LOPRESSOR Take 1 tablet (25 mg total) by mouth 2 (two) times daily.   pantoprazole 40 MG tablet Commonly known as:  PROTONIX Take 1 tablet (40 mg total) by mouth daily.      Follow-up Information    Dorrene German, MD. Schedule an appointment as soon as possible for a visit in 1 week(s).   Specialty:  Internal Medicine Contact information: [address] [place] [address] [phone]        Chrystie Nose, MD. Schedule an appointment as soon as possible for a visit in 2 week(s).   Specialty:  Cardiology Contact information: [address] [place] [address] [place] [address] [phone]          No Known Allergies  Consultations:   cardiology   Other Procedures/Studies: X-ray Chest Pa And Lateral  Result Date: 10/05/2016 CLINICAL DATA:  History of atrial fibrillation and pancreatitis. EXAM: CHEST  2 VIEW COMPARISON:  Chest radiograph 12/13/2015. FINDINGS: Monitoring leads overlie the patient. Stable cardiac and mediastinal contours. Low lung volumes. Heterogeneous opacities lung bases bilaterally. No definite pleural effusion or pneumothorax. Thoracic spine degenerative changes. IMPRESSION: Low lung volumes with basilar opacities favored to represent atelectasis. Electronically Signed   By: Annia Belt M.D.   On: 10/05/2016 07:12   Ct Abdomen Pelvis W Contrast  Result Date: 10/04/2016 CLINICAL DATA:  Right-sided and lower  abdominal pain with vomiting for a few hours ago. History of cholecystectomy. EXAM: CT ABDOMEN AND PELVIS WITH CONTRAST TECHNIQUE: Multidetector CT imaging of the abdomen and pelvis was performed using the standard protocol following bolus administration of intravenous contrast. CONTRAST:  100 ml  ISOVUE-300 IOPAMIDOL (ISOVUE-300) INJECTION 61% COMPARISON:  01/04/2014 FINDINGS: Lower chest: Linear scarring in the right lung base, possibly associated with surgical change. Atelectasis in the lung bases. Hepatobiliary: Diffuse fatty infiltration of the liver. No focal liver lesions identified. Gallbladder is surgically absent. Bile ducts are not dilated. Pancreas: There is fluid around the head of the pancreas extending into the gallbladder fossa and along the right anterior pararenal space. This appearance is suspicious for focal pancreatitis. No loculated collections. No evidence of pancreatic necrosis or pancreatic duct dilatation. Spleen: Normal in size without focal abnormality. Adrenals/Urinary Tract: Adrenal glands are unremarkable. Kidneys are normal, without renal calculi, focal lesion, or hydronephrosis. Bladder is unremarkable. Stomach/Bowel: Stomach, small bowel, and colon are not abnormally distended. No wall thickening is appreciated. Scattered diverticula in the colon without evidence of diverticulitis. There is not infraumbilical ventral abdominal wall hernia containing fat and transverse colon. No proximal obstruction. The hernia is new since previous study. Appendix is normal. Vascular/Lymphatic: Aortic atherosclerosis. No enlarged abdominal or pelvic lymph nodes. Reproductive: Uterus and bilateral adnexa are unremarkable. Other: Rounded calcification with central fat density in the  left anterior pelvis. No change since prior study. This likely represents calcified fat necrosis. No free air in the abdomen. Musculoskeletal: Degenerative changes in the spine. No destructive bone lesions. IMPRESSION:  Edema around the head of the pancreas with extension into the gallbladder fossa and right anterior pararenal space. Changes are likely to represent focal pancreatitis. No loculated collection or pancreatic necrosis. Anterior abdominal wall hernia containing transverse colon. No obstruction. Diffuse fatty infiltration of the liver. Electronically Signed   By: Burman Nieves M.D.   On: 10/04/2016 01:46      TODAY-DAY OF DISCHARGE:  Subjective:   Emily Farmer today has no headache,no chest abdominal pain,no new weakness tingling or numbness, feels much better wants to go home today.   Objective:   Blood pressure 128/64, pulse 78, temperature 97.9 F (36.6 C), temperature source Oral, resp. rate 18, height  (1.6 m), weight 78.6 kg (173 lb 3.2 oz), SpO2 97 %.  Intake/Output Summary (Last 24 hours) at 10/09/16 0945 Last data filed at 10/09/16 0900  Gross per 24 hour  Intake             1730 ml  Output              825 ml  Net              905 ml   Filed Weights   10/07/16 0425 10/08/16 0415 10/09/16 0330  Weight: 80.1 kg (176 lb 8 oz) 80.6 kg (177 lb 12.8 oz) 78.6 kg (173 lb 3.2 oz)    Exam: Awake Alert, Oriented *3, No new F.N deficits, Normal affect [place].AT,PERRAL Supple Neck,No JVD, No cervical lymphadenopathy appriciated.  Symmetrical Chest wall movement, Good air movement bilaterally, CTAB RRR,No Gallops,Rubs or new Murmurs, No Parasternal Heave +ve B.Sounds, Abd Soft, Non tender, No organomegaly appriciated, No rebound -guarding or rigidity. No Cyanosis, Clubbing or edema, No new Rash or bruise   PERTINENT RADIOLOGIC STUDIES: X-ray Chest Pa And Lateral  Result Date: 10/05/2016 CLINICAL DATA:  History of atrial fibrillation and pancreatitis. EXAM: CHEST  2 VIEW COMPARISON:  Chest radiograph 12/13/2015. FINDINGS: Monitoring leads overlie the patient. Stable cardiac and mediastinal contours. Low lung volumes. Heterogeneous opacities lung bases bilaterally. No definite  pleural effusion or pneumothorax. Thoracic spine degenerative changes. IMPRESSION: Low lung volumes with basilar opacities favored to represent atelectasis. Electronically Signed   By: Annia Belt M.D.   On: 10/05/2016 07:12   Ct Abdomen Pelvis W Contrast  Result Date: 10/04/2016 CLINICAL DATA:  Right-sided and lower abdominal pain with vomiting for a few hours ago. History of cholecystectomy. EXAM: CT ABDOMEN AND PELVIS WITH CONTRAST TECHNIQUE: Multidetector CT imaging of the abdomen and pelvis was performed using the standard protocol following bolus administration of intravenous contrast. CONTRAST:  100 ml  ISOVUE-300 IOPAMIDOL (ISOVUE-300) INJECTION 61% COMPARISON:  01/04/2014 FINDINGS: Lower chest: Linear scarring in the right lung base, possibly associated with surgical change. Atelectasis in the lung bases. Hepatobiliary: Diffuse fatty infiltration of the liver. No focal liver lesions identified. Gallbladder is surgically absent. Bile ducts are not dilated. Pancreas: There is fluid around the head of the pancreas extending into the gallbladder fossa and along the right anterior pararenal space. This appearance is suspicious for focal pancreatitis. No loculated collections. No evidence of pancreatic necrosis or pancreatic duct dilatation. Spleen: Normal in size without focal abnormality. Adrenals/Urinary Tract: Adrenal glands are unremarkable. Kidneys are normal, without renal calculi, focal lesion, or hydronephrosis. Bladder is unremarkable. Stomach/Bowel: Stomach, small bowel, and colon  are not abnormally distended. No wall thickening is appreciated. Scattered diverticula in the colon without evidence of diverticulitis. There is not infraumbilical ventral abdominal wall hernia containing fat and transverse colon. No proximal obstruction. The hernia is new since previous study. Appendix is normal. Vascular/Lymphatic: Aortic atherosclerosis. No enlarged abdominal or pelvic lymph nodes. Reproductive: Uterus  and bilateral adnexa are unremarkable. Other: Rounded calcification with central fat density in the left anterior pelvis. No change since prior study. This likely represents calcified fat necrosis. No free air in the abdomen. Musculoskeletal: Degenerative changes in the spine. No destructive bone lesions. IMPRESSION: Edema around the head of the pancreas with extension into the gallbladder fossa and right anterior pararenal space. Changes are likely to represent focal pancreatitis. No loculated collection or pancreatic necrosis. Anterior abdominal wall hernia containing transverse colon. No obstruction. Diffuse fatty infiltration of the liver. Electronically Signed   By: Burman Nieves M.D.   On: 10/04/2016 01:46     PERTINENT LAB RESULTS: CBC:  Recent Labs  10/07/16 0209 10/08/16 0521  WBC 9.1 8.2  HGB 9.2* 9.7*  HCT 27.6* 27.9*  PLT 182 200   CMET CMP     Component Value Date/Time   NA 132 (L) 10/08/2016 0521   K 3.1 (L) 10/08/2016 0521   CL 101 10/08/2016 0521   CO2 22 10/08/2016 0521   GLUCOSE 191 (H) 10/08/2016 0521   BUN <5 (L) 10/08/2016 0521   CREATININE 0.43 (L) 10/08/2016 0521   CALCIUM 7.5 (L) 10/08/2016 0521   PROT 5.4 (L) 10/06/2016 0342   ALBUMIN 2.3 (L) 10/06/2016 0342   AST 22 10/06/2016 0342   ALT 16 10/06/2016 0342   ALKPHOS 78 10/06/2016 0342   BILITOT 1.2 10/06/2016 0342   GFRNONAA >60 10/08/2016 0521   GFRAA >60 10/08/2016 0521    GFR Estimated Creatinine Clearance: 75.2 mL/min (A) (by C-G formula based on SCr of 0.43 mg/dL (L)). No results for input(s): LIPASE, AMYLASE in the last 72 hours. No results for input(s): CKTOTAL, CKMB, CKMBINDEX, TROPONINI in the last 72 hours. Invalid input(s): POCBNP No results for input(s): DDIMER in the last 72 hours. No results for input(s): HGBA1C in the last 72 hours.  Recent Labs  10/06/16 1505  10/07/16 1113 10/08/16 0521  TRIG  --   < > 513* 374*  LDLDIRECT 217*  --   --   --   < > = values in this  interval not displayed. No results for input(s): TSH, T4TOTAL, T3FREE, THYROIDAB in the last 72 hours.  Invalid input(s): FREET3 No results for input(s): VITAMINB12, FOLATE, FERRITIN, TIBC, IRON, RETICCTPCT in the last 72 hours. Coags: No results for input(s): INR in the last 72 hours.  Invalid input(s): PT Microbiology: No results found for this or any previous visit (from the past 240 hour(s)).  FURTHER DISCHARGE INSTRUCTIONS:  Get Medicines reviewed and adjusted: Please take all your medications with you for your next visit with your Primary MD  Laboratory/radiological data: Please request your Primary MD to go over all hospital tests and procedure/radiological results at the follow up, please ask your Primary MD to get all Hospital records sent to his/her office.  In some cases, they will be blood work, cultures and biopsy results pending at the time of your discharge. Please request that your primary care M.D. goes through all the records of your hospital data and follows up on these results.  Also Note the following: If you experience worsening of your admission symptoms, develop shortness of  breath, life threatening emergency, suicidal or homicidal thoughts you must seek medical attention immediately by calling 911 or calling your MD immediately  if symptoms less severe.  You must read complete instructions/literature along with all the possible adverse reactions/side effects for all the Medicines you take and that have been prescribed to you. Take any new Medicines after you have completely understood and accpet all the possible adverse reactions/side effects.   Do not drive when taking Pain medications or sleeping medications (Benzodaizepines)  Do not take more than prescribed Pain, Sleep and Anxiety Medications. It is not advisable to combine anxiety,sleep and pain medications without talking with your primary care practitioner  Special Instructions: If you have smoked or  chewed Tobacco  in the last 2 yrs please stop smoking, stop any regular Alcohol  and or any Recreational drug use.  Wear Seat belts while driving.  Please note: You were cared for by a hospitalist during your hospital stay. Once you are discharged, your primary care physician will handle any further medical issues. Please note that NO REFILLS for any discharge medications will be authorized once you are discharged, as it is imperative that you return to your primary care physician (or establish a relationship with a primary care physician if you do not have one) for your post hospital discharge needs so that they can reassess your need for medications and monitor your lab values.  Total Time spent coordinating discharge including counseling, education and face to face time equals  45 minutes.  SignedJeoffrey Massed 10/09/2016 9:45 AM

## 2016-10-21 ENCOUNTER — Ambulatory Visit (INDEPENDENT_AMBULATORY_CARE_PROVIDER_SITE_OTHER): Payer: Self-pay | Admitting: Nurse Practitioner

## 2016-10-21 ENCOUNTER — Encounter: Payer: Self-pay | Admitting: Nurse Practitioner

## 2016-10-21 VITALS — BP 116/68 | HR 79 | Ht 63.0 in | Wt 156.2 lb

## 2016-10-21 DIAGNOSIS — E781 Pure hyperglyceridemia: Secondary | ICD-10-CM

## 2016-10-21 DIAGNOSIS — I480 Paroxysmal atrial fibrillation: Secondary | ICD-10-CM

## 2016-10-21 DIAGNOSIS — I10 Essential (primary) hypertension: Secondary | ICD-10-CM

## 2016-10-21 DIAGNOSIS — E782 Mixed hyperlipidemia: Secondary | ICD-10-CM

## 2016-10-21 NOTE — Progress Notes (Signed)
Office Visit    Patient Name: Emily Farmer Date of Encounter: 10/21/2016  Primary Care Provider:  Fleet Contras, MD Primary Cardiologist:  C. Hilty, MD   Chief Complaint    61 year old female with a history of hypertension, hyperlipidemia, hypertriglyceridemia, diabetes, and recent admission for pancreatitis and atrial fibrillation, who presents for follow-up.  Past Medical History    Past Medical History:  Diagnosis Date  . Arthritis   . Atrial fibrillation (HCC)    a. 09/2016 in setting of pancreatitis;  b. 09/2016 Echo: EF 65-60%, no rwma, Gr2 DD, mild MR, triv TR, PASP ;  CHA2DS2VASc = 4-->Eliquis 5mg  BID.  . Diabetes mellitus   . Gout   . Hyperlipidemia   . Hypertension   . Hypertriglyceridemia   . Pancreatitis    a. 09/2016 - Triglycerides 1,392 on admission.   Past Surgical History:  Procedure Laterality Date  . CHOLECYSTECTOMY N/A 01/04/2014   Procedure: LAPAROSCOPIC CHOLECYSTECTOMY WITH INTRAOPERATIVE CHOLANGIOGRAM;  Surgeon: Axel Filler, MD;  Location: [facility] OR;  Service: General;  Laterality: N/A;  . LUNG SURGERY      Allergies  No Known Allergies  History of Present Illness    61 year old female with the above past medical history including hypertension, hyperlipidemia, hypertriglyceridemia, gout, diabetes, and arthritis. She was recently admitted with abdominal pain And pancreatitis in the setting of a lipase of 335 with triglycerides of 1392. She was also noted be in rapid atrial fibrillation. She did convert to sinus rhythm. In the setting of a CHA2DS2VASc of 4, she was placed on eliquis 5 mg twice a day.Since her discharge, she has done well. She has had no further abdominal discomfort or palpitations. She denies chest pain, PND, orthopnea, dizziness, sick B, edema, or early satiety. She does have dyspnea on exertion when she goes up inclines or steps. This is chronic. She otherwise considers herself to be relatively active. She's been compliant with  her medications and tolerating well. She does not have insurance and is concerned about the long-term costs of eliquis.  Home Medications   Prior to Admission medications   Medication Sig Start Date End Date Taking? Authorizing Provider  apixaban (ELIQUIS) 5 MG TABS tablet Take 1 tablet (5 mg total) by mouth 2 (two) times daily. 10/09/16   Ghimire, Werner Lean, MD  atorvastatin (LIPITOR) 10 MG tablet Take 1 tablet (10 mg total) by mouth daily. 10/09/16   Ghimire, Werner Lean, MD  fenofibrate 160 MG tablet Take 1 tablet (160 mg total) by mouth daily. 10/09/16   Ghimire, Werner Lean, MD  insulin glargine (LANTUS) 100 UNIT/ML injection Inject 0.12 mLs (12 Units total) into the skin at bedtime. 10/09/16   Ghimire, Werner Lean, MD  metFORMIN (GLUCOPHAGE) 500 MG tablet Take 500 mg by mouth 2 (two) times daily with a meal.    [provider]  metoprolol tartrate (LOPRESSOR) 25 MG tablet Take 1 tablet (25 mg total) by mouth 2 (two) times daily. 10/09/16   Ghimire, Werner Lean, MD  pantoprazole (PROTONIX) 40 MG tablet Take 1 tablet (40 mg total) by mouth daily. 10/09/16   Ghimire, Werner Lean, MD    Review of Systems    As above, she has done well. She has had no further abdominal pain. She denies chest pain, palpitations, PND, orthopnea, dizziness, syncope, edema, or early satiety. She does have mild chronic dyspnea on exertion with inclines and steps.  All other systems reviewed and are otherwise negative except as noted above.  Physical Exam  VS:  BP 116/68   Pulse 79   Ht 5\' 3"  (1.6 m)   Wt 156 lb 3.2 oz (70.9 kg)   BMI 27.67 kg/m  , BMI Body mass index is 27.67 kg/m. GEN: Well nourished, well developed, in no acute distress.  HEENT: normal.  Neck: Supple, no JVD, carotid bruits, or masses. Cardiac: RRR, no murmurs, rubs, or gallops. No clubbing, cyanosis, edema.  Radials/DP/PT 2+ and equal bilaterally.  Respiratory:  Respirations regular and unlabored, clear to auscultation bilaterally. GI:  Soft, nontender, nondistended, BS + x 4. MS: no deformity or atrophy. Skin: warm and dry, no rash. Neuro:  Strength and sensation are intact. Psych: Normal affect.  Accessory Clinical Findings    ECG - Regular sinus rhythm, 79, no acute ST or T changes.   Assessment & Plan    1.  Paroxysmal atrial fibrillation: Patient was recently admitted with pancreatitis and hypertriglyceridemia and found to be in rapid atrial fibrillation. She converted to sinus rhythm during hospitalization and has had no further palpitations on oral beta blocker therapy. She is in sinus rhythm today. She is anticoagulated with eliquis 5 mg twice a day in the setting of a CHA2DS2VASc of 4. She does not have insurance and I have filled out patient assistance program paperwork for her today. If for some reason she does not qualify for assistance  [facility], I will plan to explore other assistance programs for DOACs. Plan a follow-up CBC in 1 month. Of note, there was some discussion about obtaining a stress test as an outpatient. Patient has not had any chest pain. She has stable dyspnea on exertion with inclines. As she has no insurance and is asymptomatic with a normal ECG, we have agreed to hold off on stress testing at this time.  2. Essential hypertension: Blood pressure is well controlled on beta blocker therapy.  3. Type 2 diabetes mellitus: Hemoglobin A1c was 13.7 during recent admission. She is on insulin and metformin. This is followed by primary care. Compliance appears to be a significant issue.  4. Hyperlipidemia/hypertriglyceridemia: Patient is now on Lipitor and fenofibrate. Plan to follow-up lipids and LFTs in a month.  5. Pancreatitis: Recent admission for pancreatitis in the setting of market hypertriglyceridemia. Triglycerides were 1392 of admission and came down to 374 at discharge. No further abdominal discomfort. See #4.  6. Disposition: Follow-up CBC, complete metabolic panel, and lipids  in a month. Follow-up with Dr. Rennis GoldenHilty in 2-3 months or sooner if necessary. She will fill out her part of the eliquis patient assistance form and mail in as soon as possible.  Nicolasa Duckinghristopher Nakyra Bourn, NP 10/21/2016, 8:56 AM

## 2016-10-21 NOTE — Patient Instructions (Signed)
Labs in 1 month Friday 11/21/16  ( cmet,cbc,lipid panel )    Your physician recommends that you schedule a follow-up appointment with Dr.Hilty  Thursday 01/22/17 at 9:40am

## 2016-10-28 ENCOUNTER — Ambulatory Visit: Payer: Self-pay | Admitting: Cardiovascular Disease

## 2016-11-21 LAB — CBC WITH DIFFERENTIAL/PLATELET
BASOS PCT: 0 %
Basophils Absolute: 0 cells/uL (ref 0–200)
EOS ABS: 120 {cells}/uL (ref 15–500)
Eosinophils Relative: 2 %
HEMATOCRIT: 41.3 % (ref 35.0–45.0)
Hemoglobin: 13.3 g/dL (ref 11.7–15.5)
LYMPHS PCT: 27 %
Lymphs Abs: 1620 cells/uL (ref 850–3900)
MCH: 28.5 pg (ref 27.0–33.0)
MCHC: 32.2 g/dL (ref 32.0–36.0)
MCV: 88.4 fL (ref 80.0–100.0)
MONO ABS: 420 {cells}/uL (ref 200–950)
MONOS PCT: 7 %
MPV: 10.9 fL (ref 7.5–12.5)
NEUTROS PCT: 64 %
Neutro Abs: 3840 cells/uL (ref 1500–7800)
PLATELETS: 257 10*3/uL (ref 140–400)
RBC: 4.67 MIL/uL (ref 3.80–5.10)
RDW: 14.2 % (ref 11.0–15.0)
WBC: 6 10*3/uL (ref 3.8–10.8)

## 2016-11-21 LAB — COMPREHENSIVE METABOLIC PANEL
ALBUMIN: 3.9 g/dL (ref 3.6–5.1)
ALT: 11 U/L (ref 6–29)
AST: 12 U/L (ref 10–35)
Alkaline Phosphatase: 58 U/L (ref 33–130)
BILIRUBIN TOTAL: 0.3 mg/dL (ref 0.2–1.2)
BUN: 16 mg/dL (ref 7–25)
CHLORIDE: 95 mmol/L — AB (ref 98–110)
CO2: 25 mmol/L (ref 20–31)
CREATININE: 0.64 mg/dL (ref 0.50–1.05)
Calcium: 8.8 mg/dL (ref 8.6–10.4)
Glucose, Bld: 380 mg/dL — ABNORMAL HIGH (ref 65–99)
Potassium: 4 mmol/L (ref 3.5–5.3)
SODIUM: 132 mmol/L — AB (ref 135–146)
TOTAL PROTEIN: 6.6 g/dL (ref 6.1–8.1)

## 2016-11-21 LAB — LIPID PANEL
Cholesterol: 396 mg/dL — ABNORMAL HIGH (ref <200)
HDL: 45 mg/dL — AB (ref >50)
Total CHOL/HDL Ratio: 8.8 Ratio — ABNORMAL HIGH (ref <5.0)
Triglycerides: 959 mg/dL — ABNORMAL HIGH (ref <150)

## 2016-11-21 LAB — LDL CHOLESTEROL, DIRECT: LDL DIRECT: 170 mg/dL — AB (ref <100)

## 2016-11-24 ENCOUNTER — Telehealth: Payer: Self-pay | Admitting: *Deleted

## 2016-11-24 NOTE — Telephone Encounter (Signed)
Left msg to call.

## 2016-11-24 NOTE — Telephone Encounter (Signed)
-----   Message from Ok Anishristopher R Berge, NP sent at 11/21/2016  6:34 PM EDT ----- Renal fxn stable.  Glucose is high at 380 - DM needs to be better managed.  She should f/u with primary care for diabetes mgmt and her triglycerides are rising significantly again.  I am concerned that she may be headed toward recurrent pancreatitis.

## 2016-11-25 NOTE — Telephone Encounter (Signed)
New message  ° ° ° ° °Pt is returning your call for lab results  °

## 2016-11-26 NOTE — Telephone Encounter (Signed)
Results and recommendations discussed with patient, who verbalized understanding and thanks.  

## 2017-01-12 ENCOUNTER — Encounter: Payer: Self-pay | Admitting: *Deleted

## 2017-01-22 ENCOUNTER — Encounter: Payer: Self-pay | Admitting: Internal Medicine

## 2017-01-22 ENCOUNTER — Telehealth: Payer: Self-pay | Admitting: Internal Medicine

## 2017-01-22 ENCOUNTER — Ambulatory Visit (INDEPENDENT_AMBULATORY_CARE_PROVIDER_SITE_OTHER): Payer: Self-pay | Admitting: Internal Medicine

## 2017-01-22 VITALS — BP 138/70 | HR 78 | Ht 63.0 in | Wt 149.0 lb

## 2017-01-22 DIAGNOSIS — I10 Essential (primary) hypertension: Secondary | ICD-10-CM

## 2017-01-22 DIAGNOSIS — E781 Pure hyperglyceridemia: Secondary | ICD-10-CM

## 2017-01-22 DIAGNOSIS — E782 Mixed hyperlipidemia: Secondary | ICD-10-CM

## 2017-01-22 DIAGNOSIS — I480 Paroxysmal atrial fibrillation: Secondary | ICD-10-CM

## 2017-01-22 MED ORDER — FENOFIBRATE 160 MG PO TABS: 160.0000 mg | ORAL_TABLET | Freq: Every day | ORAL | 11 refills | Status: DC

## 2017-01-22 MED ORDER — OMEGA-3-ACID ETHYL ESTERS 1 G PO CAPS: 2.0000 g | ORAL_CAPSULE | Freq: Two times a day (BID) | ORAL | 11 refills | Status: DC

## 2017-01-22 MED ORDER — ATORVASTATIN CALCIUM 10 MG PO TABS: 10.0000 mg | ORAL_TABLET | Freq: Every day | ORAL | 11 refills | Status: DC

## 2017-01-22 MED ORDER — METOPROLOL TARTRATE 25 MG PO TABS: 25.0000 mg | ORAL_TABLET | Freq: Two times a day (BID) | ORAL | 11 refills | Status: DC

## 2017-01-22 MED ORDER — PANTOPRAZOLE SODIUM 40 MG PO TBEC: 40.0000 mg | DELAYED_RELEASE_TABLET | Freq: Every day | ORAL | 11 refills | Status: DC

## 2017-01-22 NOTE — Telephone Encounter (Signed)
Faxed to [employer] @ [phone] patient assistance application for eliquis 5mg  BID  Phone: [phone]

## 2017-01-22 NOTE — Progress Notes (Signed)
OFFICE FOLLOW-UP NOTE  Chief Complaint:  Follow-up pancreatitis and hypertriglyceridemia  Primary Care Physician: Fleet Contras, MD  HPI:  Emily Farmer is a 61 y.o. female with a past medial history significant for recent admission for atrial fibrillation with rapid ventricular response in the setting of acute pancreatitis. Triglycerides were markedly elevated in the thousands. She was initially placed on heparin and fenofibrate. Subsequently she was found to have an elevated LDL-C and was started on atorvastatin 10 mg daily. She was seen in follow-up by one of our nurse practitioners and noted that she was doing fairly well and that her cholesterol numbers had improved somewhat. This was several months ago. Today she tells me that she's been out of her medications and she last saw him. She says that she has no health insurance and although she works cannot afford her medications. She is only currently taking insulin and metformin, but should be on Eliquis for stroke prevention, atorvastatin, fenofibrate, metoprolol and pantoprazole. Most recently her triglycerides went up to 959, but were as low as 374 in April when she was on medication. Denies any recurrent palpitations.  PMHx:  Past Medical History:  Diagnosis Date  . Arthritis   . Atrial fibrillation (HCC)    a. 09/2016 in setting of pancreatitis;  b. 09/2016 Echo: EF 65-60%, no rwma, Gr2 DD, mild MR, triv TR, PASP ;  CHA2DS2VASc = 4-->Eliquis 5mg  BID.  . Diabetes mellitus   . Gout   . Hyperlipidemia   . Hypertension   . Hypertriglyceridemia   . Pancreatitis    a. 09/2016 - Triglycerides 1,392 on admission.    Past Surgical History:  Procedure Laterality Date  . CHOLECYSTECTOMY N/A 01/04/2014   Procedure: LAPAROSCOPIC CHOLECYSTECTOMY WITH INTRAOPERATIVE CHOLANGIOGRAM;  Surgeon: Axel Filler, MD;  Location: [facility] OR;  Service: General;  Laterality: N/A;  . LUNG SURGERY      FAMHx:  Family History  Problem Relation Age  of Onset  . Diabetes Sister   . High blood pressure Neg Hx   . High Cholesterol Neg Hx   . Heart attack Neg Hx     SOCHx:   reports that she has never smoked. She has never used smokeless tobacco. She reports that she does not drink alcohol or use drugs.  ALLERGIES:  No Known Allergies  ROS: Pertinent items noted in HPI and remainder of comprehensive ROS otherwise negative.  HOME MEDS: Current Outpatient Prescriptions on File Prior to Visit  Medication Sig Dispense Refill  . insulin glargine (LANTUS) 100 UNIT/ML injection Inject 0.12 mLs (12 Units total) into the skin at bedtime. 10 mL 0  . metFORMIN (GLUCOPHAGE) 500 MG tablet Take 500 mg by mouth 2 (two) times daily with a meal.     No current facility-administered medications on file prior to visit.     LABS/IMAGING: No results found for this or any previous visit (from the past 48 hour(s)). No results found.  LIPID PANEL:    Component Value Date/Time   CHOL 396 (H) 11/21/2016 0822   TRIG 959 (H) 11/21/2016 0822   HDL 45 (L) 11/21/2016 0822   CHOLHDL 8.8 (H) 11/21/2016 0822   VLDL NOT CALC 11/21/2016 0822   LDLCALC NOT CALC 11/21/2016 0822   LDLDIRECT 170 (H) 11/21/2016 0822     WEIGHTS: Wt Readings from Last 3 Encounters:  01/22/17 149 lb (67.6 kg)  10/21/16 156 lb 3.2 oz (70.9 kg)  10/09/16 173 lb 3.2 oz (78.6 kg)    VITALS: BP 138/70  Pulse 78   Ht 5\' 3"  (1.6 m)   Wt 149 lb (67.6 kg)   BMI 26.39 kg/m   EXAM: General appearance: alert and no distress Neck: no carotid bruit, no JVD and thyroid not enlarged, symmetric, no tenderness/mass/nodules Lungs: clear to auscultation bilaterally Heart: regular rate and rhythm, S1, S2 normal, no murmur, click, rub or gallop Abdomen: soft, non-tender; bowel sounds normal; no masses,  no organomegaly Extremities: extremities normal, atraumatic, no cyanosis or edema Pulses: 2+ and symmetric Skin: Skin color, texture, turgor normal. No rashes or  lesions Neurologic: Grossly normal Psych: Pleasant  EKG: Sinus rhythm with PVCs at 78-personally reviewed- personally reviewed  ASSESSMENT: 1. Hyperchylomicronemia 2. Pancreatitis secondary to problem #1 3. Paroxysmal atrial fibrillation-CHADSVASC score 4 4. Type 2 diabetes on insulin 5. Essential hypertension  PLAN: 1.   Unfortunately Mrs. ball is been noncompliant with her medications due to cost issues and lack of insurance. After speaking with her pharmacist today turns out that she could get Eliquis for free without any insurance. Will give her information to apply for that. I've prescribed all of her medications again and encouraged her to try to fill those. She does work and so there is some income. Is critically important for her to get on those medications. I've also added Lovaza at 2 g twice a day to help with her elevated triglycerides. We'll need to recheck her labs in a few months. Hopefully she can be compliant with medications.  Chrystie NoseKenneth C. Hilty, MD, [facility]ACC  [place]  [facility]  Attending Cardiologist  Direct Dial: [phone]  Fax: [phone]  Website:  [email or web address]  Kenneth C Hilty 01/22/2017, 5:24 PM

## 2017-01-22 NOTE — Patient Instructions (Addendum)
Your physician has recommended you make the following change in your medication:  -- CONTINUE eliquis -- START lovaza 2gram twice daily  Your physician recommends that you return for lab work in: 3 months - fasting  Your physician recommends that you schedule a follow-up appointment in 3 months

## 2017-01-28 NOTE — Telephone Encounter (Signed)
Received notification from [employer] patient assistance foundation that request for assistance has been approved and patient can get eliquis free of charge from 01/23/2017 - 01/23/2018.

## 2017-05-04 ENCOUNTER — Encounter: Payer: Self-pay | Admitting: *Deleted

## 2017-05-04 ENCOUNTER — Ambulatory Visit: Payer: Self-pay | Admitting: Internal Medicine

## 2017-05-04 LAB — LIPID PANEL
CHOL/HDL RATIO: 8.4 ratio — AB (ref 0.0–4.4)
Cholesterol, Total: 343 mg/dL — ABNORMAL HIGH (ref 100–199)
HDL: 41 mg/dL (ref >39)
TRIGLYCERIDES: 816 mg/dL — AB (ref 0–149)

## 2017-05-04 LAB — LDL CHOLESTEROL, DIRECT: LDL Direct: 211 mg/dL — ABNORMAL HIGH (ref 0–99)

## 2017-06-01 ENCOUNTER — Encounter: Payer: Self-pay | Admitting: Internal Medicine

## 2017-06-01 ENCOUNTER — Ambulatory Visit (INDEPENDENT_AMBULATORY_CARE_PROVIDER_SITE_OTHER): Payer: Self-pay | Admitting: Internal Medicine

## 2017-06-01 VITALS — BP 127/70 | HR 78 | Ht 63.0 in | Wt 146.8 lb

## 2017-06-01 DIAGNOSIS — E782 Mixed hyperlipidemia: Secondary | ICD-10-CM

## 2017-06-01 DIAGNOSIS — I10 Essential (primary) hypertension: Secondary | ICD-10-CM

## 2017-06-01 DIAGNOSIS — E781 Pure hyperglyceridemia: Secondary | ICD-10-CM

## 2017-06-01 DIAGNOSIS — I480 Paroxysmal atrial fibrillation: Secondary | ICD-10-CM

## 2017-06-01 NOTE — Progress Notes (Signed)
OFFICE FOLLOW-UP NOTE  Chief Complaint:  Follow-up pancreatitis  Primary Care Physician: Fleet ContrasAvbuere, Edwin, MD  HPI:  Emily Farmer is a 61 y.o. female with a past medial history significant for recent admission for atrial fibrillation with rapid ventricular response in the setting of acute pancreatitis. Triglycerides were markedly elevated in the thousands. She was initially placed on heparin and fenofibrate. Subsequently she was found to have an elevated LDL-C and was started on atorvastatin 10 mg daily. She was seen in follow-up by one of our nurse practitioners and noted that she was doing fairly well and that her cholesterol numbers had improved somewhat. This was several months ago. Today she tells me that she's been out of her medications and she last saw him. She says that she has no health insurance and although she works cannot afford her medications. She is only currently taking insulin and metformin, but should be on Eliquis for stroke prevention, atorvastatin, fenofibrate, metoprolol and pantoprazole. Most recently her triglycerides went up to 959, but were as low as 374 in April when she was on medication. Denies any recurrent palpitations.  06/01/2017  Mrs. Nell was seen today in follow-up.  Unfortunately she has been noncompliant with medical therapy.  She had repeat lab work which shows essentially no change in her lipid profile.  Triglycerides remain in the 800s.  She is not taking her statin, fenofibrate or omega-3 fatty acids.  The main issue is cost for her.  She is also noncompliant with Eliquis.  She is interested in samples today.  She says that she had Medicaid through her son however he aged out of that and the Medicaid went with him.  She is apparently not been able to obtain Medicaid for herself.  I am not sure if she looked into an orange card.  PMHx:  Past Medical History:  Diagnosis Date  . Arthritis   . Atrial fibrillation (HCC)    a. 09/2016 in setting of  pancreatitis;  b. 09/2016 Echo: EF 65-60%, no rwma, Gr2 DD, mild MR, triv TR, PASP 30mmHg;  CHA2DS2VASc = 4-->Eliquis 5mg  BID.  . Diabetes mellitus   . Gout   . Hyperlipidemia   . Hypertension   . Hypertriglyceridemia   . Pancreatitis    a. 09/2016 - Triglycerides 1,392 on admission.    Past Surgical History:  Procedure Laterality Date  . CHOLECYSTECTOMY N/A 01/04/2014   Procedure: LAPAROSCOPIC CHOLECYSTECTOMY WITH INTRAOPERATIVE CHOLANGIOGRAM;  Surgeon: Axel FillerArmando Ramirez, MD;  Location: [facility];  Service: General;  Laterality: N/A;  . LUNG SURGERY      FAMHx:  Family History  Problem Relation Age of Onset  . Diabetes Sister   . High blood pressure Neg Hx   . High Cholesterol Neg Hx   . Heart attack Neg Hx     SOCHx:   reports that  has never smoked. she has never used smokeless tobacco. She reports that she does not drink alcohol or use drugs.  ALLERGIES:  No Known Allergies  ROS: Pertinent items noted in HPI and remainder of comprehensive ROS otherwise negative.  HOME MEDS: Current Outpatient Medications on File Prior to Visit  Medication Sig Dispense Refill  . apixaban (ELIQUIS) 5 MG TABS tablet Take 5 mg by mouth 2 (two) times daily.    Marland Kitchen. atorvastatin (LIPITOR) 10 MG tablet Take 1 tablet (10 mg total) by mouth daily. 30 tablet 11  . fenofibrate 160 MG tablet Take 1 tablet (160 mg total) by mouth daily. 30 tablet 11  .  insulin glargine (LANTUS) 100 UNIT/ML injection Inject 0.12 mLs (12 Units total) into the skin at bedtime. 10 mL 0  . metFORMIN (GLUCOPHAGE) 500 MG tablet Take 500 mg by mouth 2 (two) times daily with a meal.    . metoprolol tartrate (LOPRESSOR) 25 MG tablet Take 1 tablet (25 mg total) by mouth 2 (two) times daily. 60 tablet 11  . omega-3 acid ethyl esters (LOVAZA) 1 g capsule Take 2 capsules (2 g total) by mouth 2 (two) times daily. 120 capsule 11  . pantoprazole (PROTONIX) 40 MG tablet Take 1 tablet (40 mg total) by mouth daily. 30 tablet 11   No current  facility-administered medications on file prior to visit.     LABS/IMAGING: No results found for this or any previous visit (from the past 48 hour(s)). No results found.  LIPID PANEL:    Component Value Date/Time   CHOL 343 (H) 05/04/2017 0842   TRIG 816 (HH) 05/04/2017 0842   HDL 41 05/04/2017 0842   CHOLHDL 8.4 (H) 05/04/2017 0842   CHOLHDL 8.8 (H) 11/21/2016 0822   VLDL NOT CALC 11/21/2016 0822   LDLCALC Comment 05/04/2017 0842   LDLDIRECT 211 (H) 05/04/2017 0842   LDLDIRECT 170 (H) 11/21/2016 40980822     WEIGHTS: Wt Readings from Last 3 Encounters:  06/01/17 146 lb 12.8 oz (66.6 kg)  01/22/17 149 lb (67.6 kg)  10/21/16 156 lb 3.2 oz (70.9 kg)    VITALS: BP 127/70   Pulse 78   Ht 5\' 3"  (1.6 m)   Wt 146 lb 12.8 oz (66.6 kg)   BMI 26.00 kg/m   EXAM: General appearance: alert and no distress Neck: no carotid bruit, no JVD and thyroid not enlarged, symmetric, no tenderness/mass/nodules Lungs: clear to auscultation bilaterally Heart: regular rate and rhythm, S1, S2 normal, no murmur, click, rub or gallop Abdomen: soft, non-tender; bowel sounds normal; no masses,  no organomegaly Extremities: extremities normal, atraumatic, no cyanosis or edema Pulses: 2+ and symmetric Skin: Skin color, texture, turgor normal. No rashes or lesions Neurologic: Grossly normal Psych: Pleasant  EKG: Deferred  ASSESSMENT: 1. Hyperchylomicronemia 2. Pancreatitis secondary to problem #1 3. Paroxysmal atrial fibrillation-CHADSVASC score 4 4. Type 2 diabetes on insulin 5. Essential hypertension  PLAN: 1.   Unfortunately Mrs. Niederer continues to be noncompliant with her medications due to cost issues and lack of insurance.  This issue has not progressed over the past 6 months.  She has been off of all of her medications since October and her LDL and triglycerides have not significantly changed.  She is at high risk for coronary events and recurrent pancreatitis.  She will need to get  medication coverage and demonstrate medicine compliance before we can continue to treat her, otherwise we are making no progress.  I will provide her with some community health resources that may be helpful for her.  Follow-up as needed.  Chrystie NoseKenneth C. Estel Tonelli, MD, [facility]ACC, FACP  [place]  [facility]  Medical Director of the [facility]  [facility] Attending Cardiologist  Direct Dial: [phone]  Fax: [phone]  Website:  [email or web address]  Bill Mcvey C Deangleo Passage 06/01/2017, 9:19 AM

## 2017-06-01 NOTE — Patient Instructions (Addendum)
Your physician recommends that you schedule a follow-up appointment with Dr. Rennis GoldenHilty as needed.   Provided [facility] with [facility] financial assistance application, contact info for [employer] as Eliquis was approved as of August 2018

## 2017-12-24 ENCOUNTER — Telehealth: Payer: Self-pay | Admitting: Internal Medicine

## 2017-12-24 NOTE — Telephone Encounter (Signed)
Faxed MD portion of eliquis patient assistance application to [employer] @ [phone]

## 2017-12-31 ENCOUNTER — Other Ambulatory Visit: Payer: Self-pay

## 2017-12-31 ENCOUNTER — Emergency Department (HOSPITAL_COMMUNITY)
Admission: EM | Admit: 2017-12-31 | Discharge: 2017-12-31 | Disposition: A | Discharge status: Home / Self Care | Payer: Self-pay | Attending: Emergency Medicine | Admitting: Emergency Medicine

## 2017-12-31 ENCOUNTER — Encounter (HOSPITAL_COMMUNITY): Payer: Self-pay | Admitting: *Deleted

## 2017-12-31 DIAGNOSIS — I10 Essential (primary) hypertension: Secondary | ICD-10-CM | POA: Insufficient documentation

## 2017-12-31 DIAGNOSIS — Z7901 Long term (current) use of anticoagulants: Secondary | ICD-10-CM | POA: Insufficient documentation

## 2017-12-31 DIAGNOSIS — E1165 Type 2 diabetes mellitus with hyperglycemia: Secondary | ICD-10-CM | POA: Insufficient documentation

## 2017-12-31 DIAGNOSIS — Z79899 Other long term (current) drug therapy: Secondary | ICD-10-CM | POA: Insufficient documentation

## 2017-12-31 DIAGNOSIS — Z794 Long term (current) use of insulin: Secondary | ICD-10-CM | POA: Insufficient documentation

## 2017-12-31 DIAGNOSIS — R739 Hyperglycemia, unspecified: Secondary | ICD-10-CM

## 2017-12-31 LAB — URINALYSIS, ROUTINE W REFLEX MICROSCOPIC
Bilirubin Urine: NEGATIVE
HGB URINE DIPSTICK: NEGATIVE
Ketones, ur: NEGATIVE mg/dL
NITRITE: NEGATIVE
PROTEIN: NEGATIVE mg/dL
SPECIFIC GRAVITY, URINE: 1.027 (ref 1.005–1.030)
pH: 5 (ref 5.0–8.0)

## 2017-12-31 LAB — CBC WITH DIFFERENTIAL/PLATELET
ABS IMMATURE GRANULOCYTES: 0 10*3/uL (ref 0.0–0.1)
BASOS ABS: 0 10*3/uL (ref 0.0–0.1)
Basophils Relative: 0 %
EOS PCT: 2 %
Eosinophils Absolute: 0.2 10*3/uL (ref 0.0–0.7)
HCT: 35.7 % — ABNORMAL LOW (ref 36.0–46.0)
HEMOGLOBIN: 11.5 g/dL — AB (ref 12.0–15.0)
Immature Granulocytes: 0 %
LYMPHS PCT: 18 %
Lymphs Abs: 1.7 10*3/uL (ref 0.7–4.0)
MCH: 27.9 pg (ref 26.0–34.0)
MCHC: 32.2 g/dL (ref 30.0–36.0)
MCV: 86.7 fL (ref 78.0–100.0)
MONO ABS: 0.5 10*3/uL (ref 0.1–1.0)
MONOS PCT: 6 %
Neutro Abs: 7.1 10*3/uL (ref 1.7–7.7)
Neutrophils Relative %: 74 %
Platelets: 196 10*3/uL (ref 150–400)
RBC: 4.12 MIL/uL (ref 3.87–5.11)
RDW: 12.3 % (ref 11.5–15.5)
WBC: 9.5 10*3/uL (ref 4.0–10.5)

## 2017-12-31 LAB — COMPREHENSIVE METABOLIC PANEL
ALBUMIN: 3.3 g/dL — AB (ref 3.5–5.0)
ALK PHOS: 73 U/L (ref 38–126)
ALT: 13 U/L (ref 0–44)
AST: 17 U/L (ref 15–41)
Anion gap: 14 (ref 5–15)
BILIRUBIN TOTAL: 0.5 mg/dL (ref 0.3–1.2)
BUN: 25 mg/dL — AB (ref 6–20)
CALCIUM: 8.7 mg/dL — AB (ref 8.9–10.3)
CO2: 20 mmol/L — AB (ref 22–32)
Chloride: 94 mmol/L — ABNORMAL LOW (ref 98–111)
Creatinine, Ser: 0.98 mg/dL (ref 0.44–1.00)
GFR calc Af Amer: >60 mL/min (ref >60)
GFR calc non Af Amer: >60 mL/min (ref >60)
GLUCOSE: 682 mg/dL — AB (ref 70–99)
POTASSIUM: 4.3 mmol/L (ref 3.5–5.1)
SODIUM: 128 mmol/L — AB (ref 135–145)
Total Protein: 6.5 g/dL (ref 6.5–8.1)

## 2017-12-31 LAB — CBG MONITORING, ED
GLUCOSE-CAPILLARY: 501 mg/dL — AB (ref 70–99)
GLUCOSE-CAPILLARY: 247 mg/dL — AB (ref 70–99)
Glucose-Capillary: >600 mg/dL (ref 70–99)
Glucose-Capillary: 299 mg/dL — ABNORMAL HIGH (ref 70–99)

## 2017-12-31 MED ORDER — SODIUM CHLORIDE 0.9 % IV BOLUS
1000.0000 mL | Freq: Once | INTRAVENOUS | Status: AC
  Administered 2017-12-31: 1000 mL via INTRAVENOUS

## 2017-12-31 MED ORDER — METFORMIN HCL 500 MG PO TABS: 500.0000 mg | ORAL_TABLET | Freq: Two times a day (BID) | ORAL | 0 refills | Status: DC

## 2017-12-31 MED ORDER — DEXTROSE-NACL 5-0.45 % IV SOLN: INTRAVENOUS | Status: DC

## 2017-12-31 MED ORDER — SODIUM CHLORIDE 0.9 % IV SOLN
INTRAVENOUS | Status: DC
  Administered 2017-12-31: 15:00:00 via INTRAVENOUS

## 2017-12-31 MED ORDER — SODIUM CHLORIDE 0.9 % IV SOLN
INTRAVENOUS | Status: DC
  Administered 2017-12-31: 4.4 [IU]/h via INTRAVENOUS
  Filled 2017-12-31: qty 1

## 2017-12-31 MED ORDER — APIXABAN 5 MG PO TABS: 5.0000 mg | ORAL_TABLET | Freq: Two times a day (BID) | ORAL | 0 refills | Status: DC

## 2017-12-31 MED ORDER — INSULIN GLARGINE 100 UNIT/ML ~~LOC~~ SOLN: 12.0000 [IU] | Freq: Every day | SUBCUTANEOUS | 0 refills | Status: DC

## 2017-12-31 NOTE — ED Notes (Signed)
Pt made aware of need for urine sample. Pt given ice water per Greta DoomBowie ([place]) and informed this NT that she would call out when she was ready to provide urine sample.

## 2017-12-31 NOTE — Discharge Instructions (Addendum)
You have been evaluated for your elevated blood sugar. Please take your medications prescribed and follow up closely with your doctor for further care.

## 2017-12-31 NOTE — ED Triage Notes (Signed)
Patient presents to ed via [facility] states she was outside at work picking up trash and felt faint, states she hasnt had her metformin or xarelto in over 1 month. Patient is alert oriented.

## 2017-12-31 NOTE — ED Notes (Signed)
Patient verbalizes understanding of discharge instructions. Opportunity for questioning and answers were provided. Ambulatory at discharge in NAD.  

## 2017-12-31 NOTE — ED Provider Notes (Signed)
[facility] EMERGENCY DEPARTMENT Provider Note   CSN: [id number] Arrival date & time: 12/31/17  1019     History   Chief Complaint Chief Complaint  Patient presents with  . Hyperglycemia    HPI Emily Farmer is a 62 y.o. female.  The history is provided by the patient. No language interpreter was used.  Hyperglycemia     62 year old female with history of atrial fibrillation, diabetes, hypertension presenting for evaluation of high blood sugar.  Patient states today while she was outside picking up trash for approximately 15 minutes, she felt flushed, lightheadedness and was having some trouble with wheezing.  Her symptom was acute onset, notified to her boss and was brought here for further evaluation.  She is found to be hyperglycemic with blood sugar greater than 600.  Patient admits that she has not had her diabetes medication as well as her anticoagulant for nearly a month due to change in her medication access.  She did mention checking her blood sugar twice daily and is normally less than 200.  She has been doing fine, denies any recent sickness.  She is currently denies having any active headache, URI symptoms, chest pain, pleuritic chest pain, shortness of breath, abdominal pain focal numbness or weakness.  She believes that heat may have caused her to feel not well.  No other complaint.  No change in her diet.  Past Medical History:  Diagnosis Date  . Arthritis   . Atrial fibrillation (HCC)    a. 09/2016 in setting of pancreatitis;  b. 09/2016 Echo: EF 65-60%, no rwma, Gr2 DD, mild MR, triv TR, PASP ;  CHA2DS2VASc = 4-->Eliquis 5mg  BID.  . Diabetes mellitus   . Gout   . Hyperlipidemia   . Hypertension   . Hypertriglyceridemia   . Pancreatitis    a. 09/2016 - Triglycerides 1,392 on admission.    Patient Active Problem List   Diagnosis Date Noted  . Mixed hyperlipidemia 06/01/2017  . Acute pancreatitis 10/04/2016  . Paroxysmal A-fib (HCC)  10/04/2016  . Cholecystitis, acute with cholelithiasis 01/04/2014  . Hypokalemia 01/04/2014  . Pyelonephritis 02/15/2013  . Dehydration 02/15/2013  . Hyperglycemia 02/15/2013  . Increased anion gap metabolic acidosis 02/15/2013  . Essential hypertension 02/15/2013  . Hypertriglyceridemia 02/15/2013  . Diabetes mellitus type 2 in obese (HCC) 02/15/2013  . Coronary atherosclerosis seen on CT 02/15/2013  . Nausea and vomiting 02/15/2013  . Diarrhea 02/15/2013    Past Surgical History:  Procedure Laterality Date  . CHOLECYSTECTOMY N/A 01/04/2014   Procedure: LAPAROSCOPIC CHOLECYSTECTOMY WITH INTRAOPERATIVE CHOLANGIOGRAM;  Surgeon: Axel Filler, MD;  Location: [facility] OR;  Service: General;  Laterality: N/A;  . LUNG SURGERY       OB History   None      Home Medications    Prior to Admission medications   Medication Sig Start Date End Date Taking? Authorizing Provider  apixaban (ELIQUIS) 5 MG TABS tablet Take 5 mg by mouth 2 (two) times daily.    [provider]  atorvastatin (LIPITOR) 10 MG tablet Take 1 tablet (10 mg total) by mouth daily. 01/22/17   Hilty, Lisette Abu, MD  fenofibrate 160 MG tablet Take 1 tablet (160 mg total) by mouth daily. 01/22/17   Hilty, Lisette Abu, MD  insulin glargine (LANTUS) 100 UNIT/ML injection Inject 0.12 mLs (12 Units total) into the skin at bedtime. 10/09/16   Ghimire, Werner Lean, MD  metFORMIN (GLUCOPHAGE) 500 MG tablet Take 500 mg by mouth 2 (two) times  daily with a meal.    [provider]  metoprolol tartrate (LOPRESSOR) 25 MG tablet Take 1 tablet (25 mg total) by mouth 2 (two) times daily. 01/22/17   Hilty, Lisette AbuKenneth C, MD  omega-3 acid ethyl esters (LOVAZA) 1 g capsule Take 2 capsules (2 g total) by mouth 2 (two) times daily. 01/22/17   Hilty, Lisette AbuKenneth C, MD  pantoprazole (PROTONIX) 40 MG tablet Take 1 tablet (40 mg total) by mouth daily. 01/22/17   Hilty, Lisette AbuKenneth C, MD    Family History Family History  Problem Relation Age of Onset  .  Diabetes Sister   . High blood pressure Neg Hx   . High Cholesterol Neg Hx   . Heart attack Neg Hx     Social History Social History   Tobacco Use  . Smoking status: Never Smoker  . Smokeless tobacco: Never Used  Substance Use Topics  . Alcohol use: No  . Drug use: No     Allergies   Patient has no known allergies.   Review of Systems Review of Systems  All other systems reviewed and are negative.    Physical Exam Updated Vital Signs BP 140/73   Pulse 73   Resp 17   Ht 5\' 3"  (1.6 m)   Wt 72.6 kg (160 lb)   SpO2 98%   BMI 28.34 kg/m   Physical Exam  Constitutional: She is oriented to person, place, and time. She appears well-developed and well-nourished. No distress.  HENT:  Head: Atraumatic.  Right Ear: External ear normal.  Left Ear: External ear normal.  Eyes: Conjunctivae are normal.  Neck: Neck supple.  Cardiovascular: Normal rate and regular rhythm.  Pulmonary/Chest: Effort normal and breath sounds normal. No stridor. She has no wheezes. She has no rales.  Abdominal: Soft. Bowel sounds are normal. She exhibits no distension. There is no tenderness.  Neurological: She is alert and oriented to person, place, and time.  Skin: No rash noted.  Psychiatric: She has a normal mood and affect.  Nursing note and vitals reviewed.    ED Treatments / Results  Labs (all labs ordered are listed, but only abnormal results are displayed) Labs Reviewed  COMPREHENSIVE METABOLIC PANEL - Abnormal; Notable for the following components:      Result Value   Sodium 128 (*)    Chloride 94 (*)    CO2 20 (*)    Glucose, Bld 682 (*)    BUN 25 (*)    Calcium 8.7 (*)    Albumin 3.3 (*)    All other components within normal limits  CBC WITH DIFFERENTIAL/PLATELET - Abnormal; Notable for the following components:   Hemoglobin 11.5 (*)    HCT 35.7 (*)    All other components within normal limits  URINALYSIS, ROUTINE W REFLEX MICROSCOPIC - Abnormal; Notable for the  following components:   Glucose, UA >=500 (*)    Leukocytes, UA SMALL (*)    Bacteria, UA RARE (*)    All other components within normal limits  CBG MONITORING, ED - Abnormal; Notable for the following components:   Glucose-Capillary >600 (*)    All other components within normal limits  CBG MONITORING, ED - Abnormal; Notable for the following components:   Glucose-Capillary 501 (*)    All other components within normal limits  CBG MONITORING, ED - Abnormal; Notable for the following components:   Glucose-Capillary 299 (*)    All other components within normal limits    EKG EKG Interpretation  Date/Time:  Thursday December 31 2017 10:35:27 EDT Ventricular Rate:  77 PR Interval:    QRS Duration: 82 QT Interval:  396 QTC Calculation: 449 R Axis:   40 Text Interpretation:  Sinus rhythm Borderline repolarization abnormality Nonspecific ST abnormality Confirmed by Cathren Laine ([id number]) on 12/31/2017 11:20:16 AM   Radiology No results found.  Procedures Procedures (including critical care time)  Medications Ordered in ED Medications  insulin regular (NOVOLIN R,HUMULIN R) 100 Units in sodium chloride 0.9 % 100 mL (1 Units/mL) infusion ( Intravenous Rate/Dose Verify 12/31/17 1446)  sodium chloride 0.9 % bolus 1,000 mL (0 mLs Intravenous Stopped 12/31/17 1241)    And  sodium chloride 0.9 % bolus 1,000 mL (0 mLs Intravenous Stopped 12/31/17 1443)    And  0.9 %  sodium chloride infusion ( Intravenous New Bag/Given 12/31/17 1447)  dextrose 5 %-0.45 % sodium chloride infusion ( Intravenous Hold 12/31/17 1347)     Initial Impression / Assessment and Plan / ED Course  I have reviewed the triage vital signs and the nursing notes.  Pertinent labs & imaging results that were available during my care of the patient were reviewed by me and considered in my medical decision making (see chart for details).     BP (!) 145/76   Pulse 68   Resp 15   Ht 5\' 3"  (1.6 m)   Wt 72.6 kg (160 lb)    SpO2 99%   BMI 28.34 kg/m    Final Clinical Impressions(s) / ED Diagnoses   Final diagnoses:  Hyperglycemia    ED Discharge Orders        Ordered    apixaban (ELIQUIS) 5 MG TABS tablet  2 times daily     12/31/17 1517    insulin glargine (LANTUS) 100 UNIT/ML injection  Daily at bedtime     12/31/17 1517    metFORMIN (GLUCOPHAGE) 500 MG tablet  2 times daily with meals     12/31/17 1517     11:30 AM Patient here with a near syncopal episode and found to be hypoglycemic with a CBG greater than 600.  She admits to not having access to her diabetic medication for nearly a month.  She is insulin-dependent.  She also has history of atrial fibrillation, on Eliquis however she is without her Eliquis for the same duration.  She does not have any strokelike symptom, no focal neuro deficit on exam.  She did mention wheezing for a short time earlier today but none currently.  No active wheezing noted on exam.  Patient does did not have any significant risk factor for PE.  However, unable to [facility] due to her age.  Work-up initiated, will place patient on glucose stabilizer.  3:14 PM Moderate improvement of her CBG from 682 to 299 with both IV fluid and insulin.  At this time, patient is stable for discharge.  Will refill her metformin, insulin, as well as her anticoagulant.  Encourage patient to follow-up closely with PCP for further management of her condition.  Return precautions discussed. Care discussed with DR. Steinl.      Fayrene Helper, PA-C 12/31/17 1519    Cathren Laine, MD 12/31/17 [phone]

## 2018-01-06 NOTE — Telephone Encounter (Signed)
Received noticed from drug company that patient is not eligible to received Eliquis as the product is covered by insurance. Per review, patient has Medicaid.   Called [employer] to inquire about this [phone]). Since patient has Medicaid, it is on preferred drug list.

## 2018-01-28 ENCOUNTER — Encounter (HOSPITAL_COMMUNITY): Payer: Self-pay

## 2018-01-28 ENCOUNTER — Inpatient Hospital Stay (HOSPITAL_COMMUNITY)
Admission: EM | Admit: 2018-01-28 | Discharge: 2018-02-01 | DRG: 982 | Disposition: A | Discharge status: Home Health | Payer: Self-pay | Attending: Internal Medicine | Admitting: Internal Medicine

## 2018-01-28 ENCOUNTER — Other Ambulatory Visit: Payer: Self-pay

## 2018-01-28 DIAGNOSIS — Z79899 Other long term (current) drug therapy: Secondary | ICD-10-CM

## 2018-01-28 DIAGNOSIS — E860 Dehydration: Secondary | ICD-10-CM | POA: Diagnosis present

## 2018-01-28 DIAGNOSIS — Z794 Long term (current) use of insulin: Secondary | ICD-10-CM

## 2018-01-28 DIAGNOSIS — I480 Paroxysmal atrial fibrillation: Secondary | ICD-10-CM | POA: Diagnosis present

## 2018-01-28 DIAGNOSIS — Z9114 Patient's other noncompliance with medication regimen: Secondary | ICD-10-CM

## 2018-01-28 DIAGNOSIS — L02215 Cutaneous abscess of perineum: Secondary | ICD-10-CM | POA: Diagnosis present

## 2018-01-28 DIAGNOSIS — I951 Orthostatic hypotension: Secondary | ICD-10-CM

## 2018-01-28 DIAGNOSIS — I482 Chronic atrial fibrillation, unspecified: Secondary | ICD-10-CM

## 2018-01-28 DIAGNOSIS — I2510 Atherosclerotic heart disease of native coronary artery without angina pectoris: Secondary | ICD-10-CM | POA: Diagnosis present

## 2018-01-28 DIAGNOSIS — E781 Pure hyperglyceridemia: Secondary | ICD-10-CM | POA: Diagnosis present

## 2018-01-28 DIAGNOSIS — E1100 Type 2 diabetes mellitus with hyperosmolarity without nonketotic hyperglycemic-hyperosmolar coma (NKHHC): Secondary | ICD-10-CM

## 2018-01-28 DIAGNOSIS — E669 Obesity, unspecified: Secondary | ICD-10-CM | POA: Diagnosis present

## 2018-01-28 DIAGNOSIS — S0191XA Laceration without foreign body of unspecified part of head, initial encounter: Secondary | ICD-10-CM | POA: Diagnosis present

## 2018-01-28 DIAGNOSIS — M1990 Unspecified osteoarthritis, unspecified site: Secondary | ICD-10-CM | POA: Diagnosis present

## 2018-01-28 DIAGNOSIS — Z91199 Patient's noncompliance with other medical treatment and regimen due to unspecified reason: Secondary | ICD-10-CM

## 2018-01-28 DIAGNOSIS — I5032 Chronic diastolic (congestive) heart failure: Secondary | ICD-10-CM | POA: Diagnosis present

## 2018-01-28 DIAGNOSIS — L02214 Cutaneous abscess of groin: Secondary | ICD-10-CM | POA: Diagnosis present

## 2018-01-28 DIAGNOSIS — D649 Anemia, unspecified: Secondary | ICD-10-CM

## 2018-01-28 DIAGNOSIS — E1101 Type 2 diabetes mellitus with hyperosmolarity with coma: Principal | ICD-10-CM | POA: Diagnosis present

## 2018-01-28 DIAGNOSIS — Z833 Family history of diabetes mellitus: Secondary | ICD-10-CM

## 2018-01-28 DIAGNOSIS — Z9049 Acquired absence of other specified parts of digestive tract: Secondary | ICD-10-CM

## 2018-01-28 DIAGNOSIS — E1169 Type 2 diabetes mellitus with other specified complication: Secondary | ICD-10-CM | POA: Diagnosis present

## 2018-01-28 DIAGNOSIS — I10 Essential (primary) hypertension: Secondary | ICD-10-CM | POA: Diagnosis present

## 2018-01-28 DIAGNOSIS — Z7901 Long term (current) use of anticoagulants: Secondary | ICD-10-CM

## 2018-01-28 DIAGNOSIS — E871 Hypo-osmolality and hyponatremia: Secondary | ICD-10-CM | POA: Diagnosis present

## 2018-01-28 DIAGNOSIS — R739 Hyperglycemia, unspecified: Secondary | ICD-10-CM | POA: Diagnosis present

## 2018-01-28 DIAGNOSIS — I4891 Unspecified atrial fibrillation: Secondary | ICD-10-CM

## 2018-01-28 DIAGNOSIS — M109 Gout, unspecified: Secondary | ICD-10-CM | POA: Diagnosis present

## 2018-01-28 DIAGNOSIS — I110 Hypertensive heart disease with heart failure: Secondary | ICD-10-CM | POA: Diagnosis present

## 2018-01-28 DIAGNOSIS — W19XXXA Unspecified fall, initial encounter: Secondary | ICD-10-CM | POA: Diagnosis present

## 2018-01-28 DIAGNOSIS — Z9119 Patient's noncompliance with other medical treatment and regimen: Secondary | ICD-10-CM

## 2018-01-28 DIAGNOSIS — E861 Hypovolemia: Secondary | ICD-10-CM | POA: Diagnosis present

## 2018-01-28 DIAGNOSIS — N764 Abscess of vulva: Secondary | ICD-10-CM | POA: Diagnosis present

## 2018-01-28 DIAGNOSIS — Z7982 Long term (current) use of aspirin: Secondary | ICD-10-CM

## 2018-01-28 DIAGNOSIS — Z8249 Family history of ischemic heart disease and other diseases of the circulatory system: Secondary | ICD-10-CM

## 2018-01-28 DIAGNOSIS — E782 Mixed hyperlipidemia: Secondary | ICD-10-CM | POA: Diagnosis present

## 2018-01-28 HISTORY — DX: Chronic diastolic (congestive) heart failure: I50.32

## 2018-01-28 LAB — BASIC METABOLIC PANEL
Anion gap: 12 (ref 5–15)
BUN: 22 mg/dL — AB (ref 6–20)
CO2: 23 mmol/L (ref 22–32)
Calcium: 9 mg/dL (ref 8.9–10.3)
Chloride: 95 mmol/L — ABNORMAL LOW (ref 98–111)
Creatinine, Ser: 0.79 mg/dL (ref 0.44–1.00)
GFR calc Af Amer: >60 mL/min (ref >60)
GLUCOSE: 563 mg/dL — AB (ref 70–99)
Potassium: 4.4 mmol/L (ref 3.5–5.1)
Sodium: 130 mmol/L — ABNORMAL LOW (ref 135–145)

## 2018-01-28 LAB — URINALYSIS, ROUTINE W REFLEX MICROSCOPIC
BILIRUBIN URINE: NEGATIVE
Glucose, UA: >=500 mg/dL — AB
Ketones, ur: NEGATIVE mg/dL
Leukocytes, UA: NEGATIVE
NITRITE: NEGATIVE
Protein, ur: NEGATIVE mg/dL
SPECIFIC GRAVITY, URINE: 1.028 (ref 1.005–1.030)
pH: 5 (ref 5.0–8.0)

## 2018-01-28 LAB — CBC
HCT: 33.7 % — ABNORMAL LOW (ref 36.0–46.0)
Hemoglobin: 11.2 g/dL — ABNORMAL LOW (ref 12.0–15.0)
MCH: 28 pg (ref 26.0–34.0)
MCHC: 33.2 g/dL (ref 30.0–36.0)
MCV: 84.3 fL (ref 78.0–100.0)
Platelets: 207 10*3/uL (ref 150–400)
RBC: 4 MIL/uL (ref 3.87–5.11)
RDW: 11.9 % (ref 11.5–15.5)
WBC: 10.5 10*3/uL (ref 4.0–10.5)

## 2018-01-28 LAB — GLUCOSE, CAPILLARY
GLUCOSE-CAPILLARY: 236 mg/dL — AB (ref 70–99)
Glucose-Capillary: 190 mg/dL — ABNORMAL HIGH (ref 70–99)
Glucose-Capillary: 190 mg/dL — ABNORMAL HIGH (ref 70–99)

## 2018-01-28 LAB — CBG MONITORING, ED
Glucose-Capillary: 560 mg/dL (ref 70–99)
Glucose-Capillary: 343 mg/dL — ABNORMAL HIGH (ref 70–99)
Glucose-Capillary: 261 mg/dL — ABNORMAL HIGH (ref 70–99)

## 2018-01-28 LAB — MRSA PCR SCREENING: MRSA by PCR: POSITIVE — AB

## 2018-01-28 LAB — MAGNESIUM: MAGNESIUM: 1.4 mg/dL — AB (ref 1.7–2.4)

## 2018-01-28 MED ORDER — ATORVASTATIN CALCIUM 10 MG PO TABS
10.0000 mg | ORAL_TABLET | Freq: Every day | ORAL | Status: DC
  Administered 2018-01-28 – 2018-01-31 (×4): 10 mg via ORAL
  Filled 2018-01-28 (×4): qty 1

## 2018-01-28 MED ORDER — SODIUM CHLORIDE 0.9 % IV SOLN
INTRAVENOUS | Status: DC
  Administered 2018-01-28 – 2018-01-31 (×5): via INTRAVENOUS

## 2018-01-28 MED ORDER — INSULIN GLARGINE 100 UNIT/ML ~~LOC~~ SOLN: 12.0000 [IU] | Freq: Every day | SUBCUTANEOUS | Status: DC

## 2018-01-28 MED ORDER — APIXABAN 5 MG PO TABS
5.0000 mg | ORAL_TABLET | Freq: Two times a day (BID) | ORAL | Status: DC
  Administered 2018-01-28: 5 mg via ORAL
  Filled 2018-01-28: qty 1

## 2018-01-28 MED ORDER — ACETAMINOPHEN 650 MG RE SUPP: 650.0000 mg | Freq: Four times a day (QID) | RECTAL | Status: DC | PRN

## 2018-01-28 MED ORDER — FENOFIBRATE 160 MG PO TABS
160.0000 mg | ORAL_TABLET | Freq: Every day | ORAL | Status: DC
  Administered 2018-01-28 – 2018-02-01 (×5): 160 mg via ORAL
  Filled 2018-01-28 (×6): qty 1

## 2018-01-28 MED ORDER — INSULIN ASPART 100 UNIT/ML ~~LOC~~ SOLN
0.0000 [IU] | Freq: Three times a day (TID) | SUBCUTANEOUS | Status: DC
  Administered 2018-01-28: 3 [IU] via SUBCUTANEOUS
  Administered 2018-01-29: 5 [IU] via SUBCUTANEOUS
  Administered 2018-01-29 – 2018-01-31 (×5): 3 [IU] via SUBCUTANEOUS
  Administered 2018-01-31 – 2018-02-01 (×4): 5 [IU] via SUBCUTANEOUS

## 2018-01-28 MED ORDER — POLYETHYLENE GLYCOL 3350 17 G PO PACK: 17.0000 g | PACK | Freq: Every day | ORAL | Status: DC | PRN

## 2018-01-28 MED ORDER — SODIUM CHLORIDE 0.9 % IV BOLUS
1000.0000 mL | Freq: Once | INTRAVENOUS | Status: AC
  Administered 2018-01-28: 1000 mL via INTRAVENOUS

## 2018-01-28 MED ORDER — INSULIN GLARGINE 100 UNIT/ML ~~LOC~~ SOLN
15.0000 [IU] | Freq: Every day | SUBCUTANEOUS | Status: DC
  Administered 2018-01-28 – 2018-01-29 (×2): 15 [IU] via SUBCUTANEOUS
  Filled 2018-01-28 (×3): qty 0.15

## 2018-01-28 MED ORDER — SODIUM CHLORIDE 0.9 % IV SOLN: INTRAVENOUS | Status: DC

## 2018-01-28 MED ORDER — MUPIROCIN 2 % EX OINT
1.0000 "application " | TOPICAL_OINTMENT | Freq: Two times a day (BID) | CUTANEOUS | Status: DC
  Administered 2018-01-28 – 2018-02-01 (×8): 1 via NASAL
  Filled 2018-01-28 (×2): qty 22

## 2018-01-28 MED ORDER — LIDOCAINE-EPINEPHRINE 2 %-1:100000 IJ SOLN
20.0000 mL | Freq: Once | INTRAMUSCULAR | Status: AC
  Administered 2018-01-28: 20 mL
  Filled 2018-01-28: qty 20

## 2018-01-28 MED ORDER — SODIUM CHLORIDE 0.9 % IV SOLN
INTRAVENOUS | Status: DC
  Administered 2018-01-28: 1.8 [IU]/h via INTRAVENOUS
  Filled 2018-01-28: qty 1

## 2018-01-28 MED ORDER — METOPROLOL TARTRATE 25 MG PO TABS
25.0000 mg | ORAL_TABLET | Freq: Two times a day (BID) | ORAL | Status: DC
  Administered 2018-01-28 – 2018-02-01 (×8): 25 mg via ORAL
  Filled 2018-01-28 (×8): qty 1

## 2018-01-28 MED ORDER — DOXYCYCLINE HYCLATE 100 MG PO TABS
100.0000 mg | ORAL_TABLET | Freq: Two times a day (BID) | ORAL | Status: DC
  Administered 2018-01-28: 100 mg via ORAL
  Filled 2018-01-28 (×2): qty 1

## 2018-01-28 MED ORDER — INSULIN REGULAR BOLUS VIA INFUSION
0.0000 [IU] | Freq: Three times a day (TID) | INTRAVENOUS | Status: DC
  Filled 2018-01-28: qty 10

## 2018-01-28 MED ORDER — DILTIAZEM HCL-DEXTROSE 100-5 MG/100ML-% IV SOLN (PREMIX): 5.0000 mg/h | INTRAVENOUS | Status: DC

## 2018-01-28 MED ORDER — ACETAMINOPHEN 325 MG PO TABS
650.0000 mg | ORAL_TABLET | Freq: Four times a day (QID) | ORAL | Status: DC | PRN
  Administered 2018-01-30 – 2018-02-01 (×3): 650 mg via ORAL
  Filled 2018-01-28 (×3): qty 2

## 2018-01-28 MED ORDER — SODIUM CHLORIDE 0.9 % IV BOLUS
2000.0000 mL | Freq: Once | INTRAVENOUS | Status: AC
  Administered 2018-01-28: 2000 mL via INTRAVENOUS

## 2018-01-28 MED ORDER — INSULIN ASPART 100 UNIT/ML ~~LOC~~ SOLN
3.0000 [IU] | Freq: Three times a day (TID) | SUBCUTANEOUS | Status: DC
  Administered 2018-01-28 – 2018-01-30 (×6): 3 [IU] via SUBCUTANEOUS

## 2018-01-28 MED ORDER — DEXTROSE 50 % IV SOLN: 25.0000 mL | INTRAVENOUS | Status: DC | PRN

## 2018-01-28 MED ORDER — OMEGA-3-ACID ETHYL ESTERS 1 G PO CAPS
2.0000 g | ORAL_CAPSULE | Freq: Two times a day (BID) | ORAL | Status: DC
  Administered 2018-01-28 – 2018-02-01 (×8): 2 g via ORAL
  Filled 2018-01-28 (×8): qty 2

## 2018-01-28 MED ORDER — ASPIRIN EC 81 MG PO TBEC
81.0000 mg | DELAYED_RELEASE_TABLET | Freq: Two times a day (BID) | ORAL | Status: DC
  Administered 2018-01-28 – 2018-01-31 (×7): 81 mg via ORAL
  Filled 2018-01-28 (×7): qty 1

## 2018-01-28 MED ORDER — PANTOPRAZOLE SODIUM 40 MG PO TBEC
40.0000 mg | DELAYED_RELEASE_TABLET | Freq: Every day | ORAL | Status: DC
  Administered 2018-01-28 – 2018-02-01 (×5): 40 mg via ORAL
  Filled 2018-01-28 (×6): qty 1

## 2018-01-28 MED ORDER — METOPROLOL TARTRATE 5 MG/5ML IV SOLN: 5.0000 mg | Freq: Four times a day (QID) | INTRAVENOUS | Status: DC | PRN

## 2018-01-28 MED ORDER — INSULIN ASPART 100 UNIT/ML ~~LOC~~ SOLN
0.0000 [IU] | Freq: Every day | SUBCUTANEOUS | Status: DC
  Administered 2018-01-29: 3 [IU] via SUBCUTANEOUS

## 2018-01-28 MED ORDER — DEXTROSE-NACL 5-0.45 % IV SOLN: INTRAVENOUS | Status: DC

## 2018-01-28 MED ORDER — CHLORHEXIDINE GLUCONATE CLOTH 2 % EX PADS
6.0000 | MEDICATED_PAD | Freq: Every day | CUTANEOUS | Status: DC
  Administered 2018-01-29 – 2018-02-01 (×3): 6 via TOPICAL

## 2018-01-28 NOTE — Progress Notes (Signed)
Received report on pt.

## 2018-01-28 NOTE — ED Notes (Signed)
Intermittent Afib, RVR oted and reported to [place] , [place].

## 2018-01-28 NOTE — H&P (Signed)
History and Physical    Emily Farmer AVW:[id number] DOB: 02-12-1956 DOA: 01/28/2018  PCP: Fleet ContrasAvbuere, Edwin, MD  Patient coming from: home  Chief Complaint: Lightheadedness upon standing  HPI: Emily BoehringerRosemary Farmer is a 62 y.o. female with medical history significant of past medical history of chronic diastolic heart failure, diabetes mellitus paroxysmal atrial fibrillation on Eliquis who came into the ED as she started feeling lightheaded upon standing that started on the day of admission, she relates she has had a dip centimeters polyuria, due to insurances problem she has not been able to afford her insulin.  She is also been complaining of high blood glucose at home and 3 boils around her genital area.,  She relates she is getting her metformin home per primary care doctor.  ED Course:  In the ED she was found to be afebrile with no leukocytosis or left shift, with a blood glucose of 600 she was given a liter of normal saline and started her on IV fluids, during her admission in the ED she was found to be in A. fib with RVR but after fluid resuscitation her A. fib resolved she was not tolerant started on rate controlling medication.  Review of Systems: As per HPI otherwise 10 point review of systems negative.   Past Medical History:  Diagnosis Date  . Arthritis   . Atrial fibrillation (HCC)    a. 09/2016 in setting of pancreatitis;  b. 09/2016 Echo: EF 65-60%, no rwma, Gr2 DD, mild MR, triv TR, PASP 30mmHg;  CHA2DS2VASc = 4-->Eliquis 5mg  BID.  Marland Kitchen. Chronic diastolic CHF (congestive heart failure) (HCC) 01/28/2018  . Diabetes mellitus   . Gout   . Hyperlipidemia   . Hypertension   . Hypertriglyceridemia   . Pancreatitis    a. 09/2016 - Triglycerides 1,392 on admission.    Past Surgical History:  Procedure Laterality Date  . CHOLECYSTECTOMY N/A 01/04/2014   Procedure: LAPAROSCOPIC CHOLECYSTECTOMY WITH INTRAOPERATIVE CHOLANGIOGRAM;  Surgeon: Axel FillerArmando Ramirez, MD;  Location: [facility] OR;  Service: General;   Laterality: N/A;  . LUNG SURGERY       reports that she has never smoked. She has never used smokeless tobacco. She reports that she does not drink alcohol or use drugs.  No Known Allergies  Family History  Problem Relation Age of Onset  . Heart attack Mother   . Heart attack Father   . Diabetes Sister   . High blood pressure Neg Hx   . High Cholesterol Neg Hx     Prior to Admission medications   Medication Sig Start Date End Date Taking? Authorizing Provider  apixaban (ELIQUIS) 5 MG TABS tablet Take 1 tablet (5 mg total) by mouth 2 (two) times daily. 12/31/17  Yes Fayrene Helperran, [place], PA-C  aspirin EC 81 MG tablet Take 81 mg by mouth 2 (two) times daily.   Yes [provider]  atorvastatin (LIPITOR) 10 MG tablet Take 1 tablet (10 mg total) by mouth daily. 01/22/17  Yes Hilty, Lisette AbuKenneth C, MD  fenofibrate 160 MG tablet Take 1 tablet (160 mg total) by mouth daily. 01/22/17  Yes Hilty, Lisette AbuKenneth C, MD  metFORMIN (GLUCOPHAGE) 500 MG tablet Take 1 tablet (500 mg total) by mouth 2 (two) times daily with a meal. 12/31/17  Yes Fayrene Helperran, [place], PA-C  metoprolol tartrate (LOPRESSOR) 25 MG tablet Take 1 tablet (25 mg total) by mouth 2 (two) times daily. 01/22/17  Yes Hilty, Lisette AbuKenneth C, MD  Multiple Vitamins-Minerals (CENTRUM SILVER PO) Take 1 tablet by mouth daily.  Yes [provider]  omega-3 acid ethyl esters (LOVAZA) 1 g capsule Take 2 capsules (2 g total) by mouth 2 (two) times daily. Patient taking differently: Take 1 g by mouth 2 (two) times daily.  01/22/17  Yes Hilty, Lisette AbuKenneth C, MD  pantoprazole (PROTONIX) 40 MG tablet Take 1 tablet (40 mg total) by mouth daily. 01/22/17  Yes Hilty, Lisette AbuKenneth C, MD  insulin glargine (LANTUS) 100 UNIT/ML injection Inject 0.12 mLs (12 Units total) into the skin at bedtime. 12/31/17   Fayrene Helperran, [place], PA-C    Physical Exam: Vitals:   01/28/18 1315 01/28/18 1330 01/28/18 1345 01/28/18 1400  BP: 97/61 117/87 120/62 132/80  Pulse: (!) 108 (!) 111 81 86  Resp: 18 (!) 21  12 11   Temp:      TempSrc:      SpO2: 98% 97% 97% 96%  Weight:      Height:        Constitutional: NAD, calm, comfortable Vitals:   01/28/18 1315 01/28/18 1330 01/28/18 1345 01/28/18 1400  BP: 97/61 117/87 120/62 132/80  Pulse: (!) 108 (!) 111 81 86  Resp: 18 (!) 21 12 11   Temp:      TempSrc:      SpO2: 98% 97% 97% 96%  Weight:      Height:       Eyes: PERRL, lids and conjunctivae normal ENMT: Dry mixed membranes per oral hygiene she does not have dentition. Neck: normal, supple, no masses, no thyromegaly Respiratory: clear to auscultation bilaterally, no wheezing, no crackles. Normal respiratory effort. No accessory muscle use.  Cardiovascular: Regular rate and rhythm, no murmurs / rubs / gallops. No extremity edema. 2+ pedal pulses. No carotid bruits.  Abdomen: no tenderness, no masses palpated. No hepatosplenomegaly. Bowel sounds positive.  Musculoskeletal: no clubbing / cyanosis. No joint deformity upper and lower extremities. Good ROM, no contractures. Normal muscle tone.  Skin: She has 3 induration around her left labia who have been drained by the ED there is no fluctuating abscesses but some induration not tender warm to touch. Neurologic: CN 2-12 grossly intact. Sensation intact, DTR normal. Strength 5/5 in all 4.  Psychiatric: Normal judgment and insight. Alert and oriented x 3. Normal mood.     Labs on Admission: I have personally reviewed following labs and imaging studies  CBC: Recent Labs  Lab 01/28/18 0821  WBC 10.5  HGB 11.2*  HCT 33.7*  MCV 84.3  PLT 207   Basic Metabolic Panel: Recent Labs  Lab 01/28/18 0821  NA 130*  K 4.4  CL 95*  CO2 23  GLUCOSE 563*  BUN 22*  CREATININE 0.79  CALCIUM 9.0   GFR: Estimated Creatinine Clearance: 67.1 mL/min (by C-G formula based on SCr of 0.79 mg/dL). Liver Function Tests: No results for input(s): AST, ALT, ALKPHOS, BILITOT, PROT, ALBUMIN in the last 168 hours. No results for input(s): LIPASE, AMYLASE  in the last 168 hours. No results for input(s): AMMONIA in the last 168 hours. Coagulation Profile: No results for input(s): INR, PROTIME in the last 168 hours. Cardiac Enzymes: No results for input(s): CKTOTAL, CKMB, CKMBINDEX, TROPONINI in the last 168 hours. BNP (last 3 results) No results for input(s): PROBNP in the last 8760 hours. HbA1C: No results for input(s): HGBA1C in the last 72 hours. CBG: Recent Labs  Lab 01/28/18 0733 01/28/18 1224  GLUCAP 560* 343*   Lipid Profile: No results for input(s): CHOL, HDL, LDLCALC, TRIG, CHOLHDL, LDLDIRECT in the last 72 hours. Thyroid Function Tests:  No results for input(s): TSH, T4TOTAL, FREET4, T3FREE, THYROIDAB in the last 72 hours. Anemia Panel: No results for input(s): VITAMINB12, FOLATE, FERRITIN, TIBC, IRON, RETICCTPCT in the last 72 hours. Urine analysis:    Component Value Date/Time   COLORURINE YELLOW 12/31/2017 1250   APPEARANCEUR CLEAR 12/31/2017 1250   LABSPEC 1.027 12/31/2017 1250   PHURINE 5.0 12/31/2017 1250   GLUCOSEU >=500 (A) 12/31/2017 1250   HGBUR NEGATIVE 12/31/2017 1250   BILIRUBINUR NEGATIVE 12/31/2017 1250   KETONESUR NEGATIVE 12/31/2017 1250   PROTEINUR NEGATIVE 12/31/2017 1250   UROBILINOGEN 0.2 01/03/2014 2001   NITRITE NEGATIVE 12/31/2017 1250   LEUKOCYTESUR SMALL (A) 12/31/2017 1250    Radiological Exams on Admission: No results found.  EKG: Independently reviewed.  A. fib with RVR with nonspecific T wave changes.  Assessment/Plan Hyperglycemic hyperosmolar nonketotic coma (HCC): - Likely due to noncompliance of her medication. - We will consult social worker. - We will give her 10 units of insulin and started on IV insulin drip should be meds every 2 hours CBGs q. Hourly. - I will also give her her Lantus now and and also restart her on IV insulin drip once her blood glucose reaches below 250 we can stop the insulin drip and continue her on her Lantus and sliding scale. - Can switch the  switch back to sliding scale insulin with long-acting insulin - Check an A1c.  A. fib with RVR/paroxysmal atrial fibrillation: - On presentation to the ED she was in normal sinus rhythm, she was taking her metoprolol at home. - Eventually she developed A. fib with RVR once fluid resuscitation was started she converted back into sinus rhythm. - We will continue her oral metoprolol and Eliquis.  She seems to be rate controlled.  Labial abscesses: - She has a white count of 10.9 she remains afebrile we will start her empirically on oral doxycycline - Is likely due to uncontrolled blood glucose as she has not been able to afford her insulin at home.  Orthostatic hypotension/essential hypertension: - Likely due to hyperglycemia. - We will continue her metoprolol hold all of her other antihypertensive medications and hydrate her aggressively.  We will recheck orthostatics in the morning.  Hyponatremia: We will consult case manager to help with assistance of her medication.  Normocytic anemia: Previous hemoglobin was 9.7 hemoglobin seems to be close to baseline she denies any melanotic stools. Her hemoglobin could possibly drop after fluid resuscitation we will check a Hbg in the morning.  Chronic diastolic CHF (congestive heart failure) (HCC): She is on no diuretics at home seems to be actually hypovolemic, will continue metoprolol for rate control.   DVT prophylaxis: Eluquis Code Status: Full Family Communication: none Disposition Plan: home in 2 days Consults called: none Admission status: inpatient   Marinda Elk MD [facility] Pager [phone]  If 7PM-7AM, please contact night-coverage [email or web address] Password [facility]  01/28/2018, 3:23 PM

## 2018-01-28 NOTE — ED Notes (Signed)
Hospitalist at the bedside 

## 2018-01-28 NOTE — ED Triage Notes (Signed)
Pt arrives EMS from home with c/o dizziness. Pt states she has been out of her insulin for 1 month and taking a friends metformin. Pt also c/o "bump" at forehead x 3-4 days after fall while walking. Also c/o ":bump" on vagina that "burst last nigh" and is now bleeding.

## 2018-01-28 NOTE — ED Notes (Signed)
Dressign placed to wound at head. Coban under chin but pt denies difficul;ty breathing or speaking.Provider aware and [facility].

## 2018-01-28 NOTE — Progress Notes (Signed)
Emily BoehringerRosemary Farmer is a 62 y.o. female patient admitted from ED awake, alert - oriented  X 4 - no acute distress noted.  VSS - Blood pressure 109/68, pulse 81, temperature 98.8 F (37.1 C), temperature source Oral, resp. rate 16, height 5\' 3"  (1.6 m), weight 63.5 kg, SpO2 96 %.    IV in place, occlusive dsg intact without redness.  Orientation to room, and floor completed with information packet given to patient/family.  Patient declined safety video at this time.  Admission INP armband ID verified with patient/family, and in place.   SR up x 2, fall assessment complete, with patient and family able to verbalize understanding of risk associated with falls, and verbalized understanding to call nsg before up out of bed.  Call light within reach, patient able to voice, and demonstrate understanding.     Will cont to eval and treat per MD orders.  Theodosia BlenderEvan J Cebastian Neis, RN 01/28/2018 4:55 PM

## 2018-01-28 NOTE — ED Provider Notes (Addendum)
[facility] EMERGENCY DEPARTMENT Provider Note   CSN: [id number] Arrival date & time: 01/28/18  0719     History   Chief Complaint Chief Complaint  Patient presents with  . Hyperglycemia    HPI Emily Farmer is a 62 y.o. female.  Who presents emergency department with chief complaint of boils and hyperglycemia.  She is a 62 year old female with a past medical history of insulin-dependent diabetes.  She has been out of her medications for the past month.  Patient states that she had disruption of her Medicaid and currently does not have a primary care physician.  She is using 1 of her friend's metformin.  Patient states that she noticed red blisters on her left labia that have been extremely painful.  She said 1 of them burst.  She thought it might be a blood clot.  She is currently taking Eliquis twice daily for her paroxysmal atrial fibrillation has been compliant with her medicine.  She states that they did try to reach out with [employer] for indication assistance in obtaining her insulin however she is unable to afford it.  Patient says that she has also been feeling lightheaded and dizzy today and had a presyncopal event this morning when going from a sitting to a standing position rapidly.  She states that she has had polyuria and polydipsia.  2 days ago she noticed a tender area on her left parietal scalp that has gotten more tender and is similar nature to the boils on her labia.  She denies any vaginal symptoms, dysuria, foul odor of urine.  She has not had any fevers chills cough abdominal pain nausea or vomiting.  HPI  Past Medical History:  Diagnosis Date  . Arthritis   . Atrial fibrillation (HCC)    a. 09/2016 in setting of pancreatitis;  b. 09/2016 Echo: EF 65-60%, no rwma, Gr2 DD, mild MR, triv TR, PASP ;  CHA2DS2VASc = 4-->Eliquis 5mg  BID.  . Diabetes mellitus   . Gout   . Hyperlipidemia   . Hypertension   . Hypertriglyceridemia   .  Pancreatitis    a. 09/2016 - Triglycerides 1,392 on admission.    Patient Active Problem List   Diagnosis Date Noted  . Mixed hyperlipidemia 06/01/2017  . Acute pancreatitis 10/04/2016  . Paroxysmal A-fib (HCC) 10/04/2016  . Cholecystitis, acute with cholelithiasis 01/04/2014  . Hypokalemia 01/04/2014  . Pyelonephritis 02/15/2013  . Dehydration 02/15/2013  . Hyperglycemia 02/15/2013  . Increased anion gap metabolic acidosis 02/15/2013  . Essential hypertension 02/15/2013  . Hypertriglyceridemia 02/15/2013  . Diabetes mellitus type 2 in obese (HCC) 02/15/2013  . Coronary atherosclerosis seen on CT 02/15/2013  . Nausea and vomiting 02/15/2013  . Diarrhea 02/15/2013    Past Surgical History:  Procedure Laterality Date  . CHOLECYSTECTOMY N/A 01/04/2014   Procedure: LAPAROSCOPIC CHOLECYSTECTOMY WITH INTRAOPERATIVE CHOLANGIOGRAM;  Surgeon: Axel Filler, MD;  Location: [facility] OR;  Service: General;  Laterality: N/A;  . LUNG SURGERY       OB History   None      Home Medications    Prior to Admission medications   Medication Sig Start Date End Date Taking? Authorizing Provider  apixaban (ELIQUIS) 5 MG TABS tablet Take 1 tablet (5 mg total) by mouth 2 (two) times daily. 12/31/17  Yes Fayrene Helper, PA-C  aspirin EC 81 MG tablet Take 81 mg by mouth 2 (two) times daily.   Yes [provider]  atorvastatin (LIPITOR) 10 MG tablet Take 1 tablet (  10 mg total) by mouth daily. 01/22/17  Yes Hilty, Lisette AbuKenneth C, MD  fenofibrate 160 MG tablet Take 1 tablet (160 mg total) by mouth daily. 01/22/17  Yes Hilty, Lisette AbuKenneth C, MD  metFORMIN (GLUCOPHAGE) 500 MG tablet Take 1 tablet (500 mg total) by mouth 2 (two) times daily with a meal. 12/31/17  Yes Fayrene Helperran, [place], PA-C  metoprolol tartrate (LOPRESSOR) 25 MG tablet Take 1 tablet (25 mg total) by mouth 2 (two) times daily. 01/22/17  Yes Hilty, Lisette AbuKenneth C, MD  Multiple Vitamins-Minerals (CENTRUM SILVER PO) Take 1 tablet by mouth daily.   Yes [provider]  omega-3 acid ethyl esters (LOVAZA) 1 g capsule Take 2 capsules (2 g total) by mouth 2 (two) times daily. Patient taking differently: Take 1 g by mouth 2 (two) times daily.  01/22/17  Yes Hilty, Lisette AbuKenneth C, MD  pantoprazole (PROTONIX) 40 MG tablet Take 1 tablet (40 mg total) by mouth daily. 01/22/17  Yes Hilty, Lisette AbuKenneth C, MD  insulin glargine (LANTUS) 100 UNIT/ML injection Inject 0.12 mLs (12 Units total) into the skin at bedtime. 12/31/17   Fayrene Helperran, [place], PA-C    Family History Family History  Problem Relation Age of Onset  . Diabetes Sister   . High blood pressure Neg Hx   . High Cholesterol Neg Hx   . Heart attack Neg Hx     Social History Social History   Tobacco Use  . Smoking status: Never Smoker  . Smokeless tobacco: Never Used  Substance Use Topics  . Alcohol use: No  . Drug use: No     Allergies   Patient has no known allergies.   Review of Systems Review of Systems Ten systems reviewed and are negative for acute change, except as noted in the HPI.    Physical Exam Updated Vital Signs BP (!) 142/86 (BP Location: Right Arm)   Pulse 77   Temp 98.8 F (37.1 C) (Oral)   Resp 15   Ht 5\' 3"  (1.6 m)   Wt 63.5 kg   SpO2 97%   BMI 24.80 kg/m   Physical Exam  Constitutional: She is oriented to person, place, and time. She appears well-developed and well-nourished. No distress.  HENT:  Head: Normocephalic and atraumatic.  Eyes: Conjunctivae are normal. No scleral icterus.  Neck: Normal range of motion.  Cardiovascular: Normal rate, regular rhythm and normal heart sounds. Exam reveals no gallop and no friction rub.  No murmur heard. Pulmonary/Chest: Effort normal and breath sounds normal. No respiratory distress.  Abdominal: Soft. Bowel sounds are normal. She exhibits no distension and no mass. There is no tenderness. There is no guarding.  Neurological: She is alert and oriented to person, place, and time.  Skin: Skin is warm and dry. She is not  diaphoretic.  Psychiatric: Her behavior is normal.  Nursing note and vitals reviewed.    ED Treatments / Results  Labs (all labs ordered are listed, but only abnormal results are displayed) Labs Reviewed  CBG MONITORING, ED - Abnormal; Notable for the following components:      Result Value   Glucose-Capillary 560 (*)    All other components within normal limits  BASIC METABOLIC PANEL  CBC  URINALYSIS, ROUTINE W REFLEX MICROSCOPIC    EKG None  Radiology No results found.  Procedures .Critical Care Performed by: Arthor CaptainHarris, Lakeena Downie, PA-C Authorized by: Arthor CaptainHarris, Callin Ashe, PA-C   Critical care provider statement:    Critical care time (minutes):  60   Critical care was necessary  to treat or prevent imminent or life-threatening deterioration of the following conditions:  Metabolic crisis, cardiac failure, circulatory failure and toxidrome   Critical care was time spent personally by me on the following activities:  Discussions with consultants, evaluation of patient's response to treatment, examination of patient, ordering and performing treatments and interventions, ordering and review of laboratory studies, ordering and review of radiographic studies, pulse oximetry, re-evaluation of patient's condition, obtaining history from patient or surrogate and review of old charts  INCISION AND DRAINAGE Performed by: Arthor CaptainAbigail Gracelee Stemmler Consent: Verbal consent obtained. Risks and benefits: risks, benefits and alternatives were discussed Type: abscess  Body area: Scalp  Anesthesia: local infiltration  Incision was made with a scalpel.  Local anesthetic: lidocaine 2% w epinephrine  Anesthetic total: 2 ml  Complexity: complex Blunt dissection to break up loculations  Drainage: purulent  Drainage amount: moderate   Patient tolerance: Patient tolerated the procedure well with no immediate complications.   INCISION AND DRAINAGE Performed by: Arthor CaptainAbigail Kinzlee Selvy Consent: Verbal consent  obtained. Risks and benefits: risks, benefits and alternatives were discussed Type: abscess  Body area: Mons pubis   Anesthesia: local infiltration  Incision was made with a scalpel.  Local anesthetic: lidocaine 2% w epinephrine  Anesthetic total: 3 ml  Complexity: complex Blunt dissection to break up loculations  Drainage: purulent  Drainage amount: copious  Packing material: 1/4 in iodoform gauze  Patient tolerance: Patient tolerated the procedure well with no immediate complications.   INCISION AND DRAINAGE Performed by: Arthor CaptainAbigail Takeysha Bonk Consent: Verbal consent obtained. Risks and benefits: risks, benefits and alternatives were discussed Type: abscess  Body area: R labia  Anesthesia: local infiltration  Incision was made with a scalpel.  Local anesthetic: lidocaine 2% w epinephrine  Anesthetic total: 1 ml  Complexity: complex Blunt dissection to break up loculations  Drainage: purulent  Drainage amount: moderate  Packing material: 1/4 in iodoform gauze  Patient tolerance: Patient tolerated the procedure well with no immediate complications.   INCISION AND DRAINAGE Performed by: Arthor CaptainAbigail Rebakah Cokley Consent: Verbal consent obtained. Risks and benefits: risks, benefits and alternatives were discussed Type: abscess  Body area: R labia  Anesthesia: local infiltration  Incision was made with a scalpel.  Local anesthetic: lidocaine 2% w epinephrine  Anesthetic total: 1 ml  Complexity: complex Blunt dissection to break up loculations  Drainage: purulent  Drainage amount: scant   Patient tolerance: Patient tolerated the procedure well with no immediate complications.   INCISION AND DRAINAGE Performed by: Arthor CaptainAbigail Darrien Laakso Consent: Verbal consent obtained. Risks and benefits: risks, benefits and alternatives were discussed Type: abscess  Body area: r inguinium  Anesthesia: local infiltration  Incision was made with a scalpel.  Local  anesthetic: lidocaine 2% w epinephrine  Anesthetic total: 1 ml  Complexity: complex Blunt dissection to break up loculations  Drainage: purulent  Drainage amount: minimal   Patient tolerance: Patient tolerated the procedure well with no immediate complications.   INCISION AND DRAINAGE Performed by: Arthor CaptainAbigail Lindyn Vossler Consent: Verbal consent obtained. Risks and benefits: risks, benefits and alternatives were discussed Type: abscess  Body area: R perineum  Anesthesia: local infiltration  Incision was made with a scalpel.  Local anesthetic: lidocaine 2% w epinephrine  Anesthetic total: 2 ml  Complexity: complex Blunt dissection to break up loculations  Drainage: purulent  Drainage amount: copious   Patient tolerance: Patient tolerated the procedure well with no immediate complications.     Medications Ordered in ED Medications - No data to display   Initial Impression /  Assessment and Plan / ED Course  I have reviewed the triage vital signs and the nursing notes.  Pertinent labs & imaging results that were available during my care of the patient were reviewed by me and considered in my medical decision making (see chart for details).  Clinical Course as of Jan 28 1718  Thu Jan 28, 2018  1309 Now    [AH]  1310 Patient's blood sugar has improved with fluid rehydration alone.  We will recheck her orthostatics which were markedly positive for earlier.  In the interim the patient went into A. fib with RVR.  She has a history of paroxysmal atrial fibrillation.  Patient is currently taking Eliquis but does not take any rate control medications.  In the setting of documented orthostatic hypotension in a current rate of about 119 we will continue with treating her volume status and recheck her orthostatics prior to administering any type of rate control agents if necessary.   [AH]  1347 Patient orthostatics have worsened.  She is now in A. fib with RVR.  Blood pressure is  now lower and I have concern about giving her beta-blocker or calcium channel blocker with blood pressure that is low.  I have discussed this with Dr. Deretha Emory and he will see the patient.  Patient's chads BASC score is as listed below.  She states that she is compliant with Eliquis.   [AH]  1717 Patient hyperglycemic without anion gap   [AH]  1718 His initial EKG showed sinus rhythm.  Repeat EKG showed A. fib at a rate of 119.     [AH]    Clinical Course User Index [AH] Arthor Captain, PA-C    CHA2DS2/VAS Stroke Risk Points  Current as of 10 minutes ago     4 >= 2 Points: High Risk  1 - 1.99 Points: Medium Risk  0 Points: Low Risk    This is the only CHA2DS2/VAS Stroke Risk Points available for the past  year.:  Last Change: N/A     Details    This score determines the patient's risk of having a stroke if the  patient has atrial fibrillation.       Points Metrics  0 Has Congestive Heart Failure:  No    Current as of 10 minutes ago  1 Has Vascular Disease:  Yes    Current as of 10 minutes ago  1 Has Hypertension:  Yes    Current as of 10 minutes ago  0 Age:  58    Current as of 10 minutes ago  1 Has Diabetes:  Yes    Current as of 10 minutes ago  0 Had Stroke:  No  Had TIA:  No  Had thromboembolism:  No    Current as of 10 minutes ago  1 Female:  Yes    Current as of 10 minutes ago      Patient out of her medications for the past month.  Compliant with Eliquis.  Patient came in with 6 total abscesses which were drained at bedside.  Patient had significant orthostatic vital signs as listed below which is worsening after fluid resuscitation.  Patient also went into A. fib with RVR however converted back to sinus rhythm prior to administration of the diltiazem drip.  Given the patient's low blood pressures I was cautious with giving that medication in order not to cause significant hypotension.  The patient has multiple barriers outside of the hospital including no access to  primary care  at this time and significant comorbidities.  Given the fact that she has continued orthostatic hypotension, infections I feel the patient needs inpatient admission I have consulted with Dr. Robb Matar who will admit the patient.  Orthostatic Lying   BP- Lying: 120/60   Pulse- Lying: 72       Orthostatic Sitting  BP- Sitting: 107/68   Pulse- Sitting: 88       Orthostatic Standing at 0 minutes  BP- Standing at 0 minutes: 87/49Abnormal    Pulse- Standing at 0 minutes: 102       Orthostatic Lying   BP- Lying: 102/58   Pulse- Lying: 117       Orthostatic Sitting  BP- Sitting: 85/70Abnormal    Pulse- Sitting: 134       Orthostatic Standing at 0 minutes  BP- Standing at 0 minutes: 79/62Abnormal    Pulse- Standing at 0 minutes: 160      Final Clinical Impressions(s) / ED Diagnoses   Final diagnoses:  Hyperglycemia  Orthostatic hypotension  Paroxysmal A-fib [facility])    ED Discharge Orders    None       Arthor Captain, PA-C 01/28/18 1717    Arthor Captain, PA-C 01/28/18 1719    Vanetta Mulders, MD 01/28/18 1734

## 2018-01-28 NOTE — Care Management (Addendum)
ED CM received consult concerning patient needing assistance with insulin and PCP follow up. ED CM met with patient at bedside patient reports not having taken insulin due not being able to afford medications, and not having a PCP. CM discussed MATCH program and guidelines including $3 co-pay with instruction how to redeem, patient verbalized understanding. CM printed and provided [place] letter, CM also contacted CM at [facility] concerning follow up for DM management. Updated A. Harris PA-C on care transitional planning. CM was informed that patient will be admitted to inpatient. Unit CM will follow for any additional care transition needs.

## 2018-01-28 NOTE — ED Notes (Signed)
Pt in NSR rhythm at present. Providfer aware and states to hold diltiazem.

## 2018-01-28 NOTE — Progress Notes (Deleted)
Attempted report x1. 

## 2018-01-29 DIAGNOSIS — E1100 Type 2 diabetes mellitus with hyperosmolarity without nonketotic hyperglycemic-hyperosmolar coma (NKHHC): Secondary | ICD-10-CM

## 2018-01-29 LAB — CBC
HCT: 29.5 % — ABNORMAL LOW (ref 36.0–46.0)
HEMOGLOBIN: 9.6 g/dL — AB (ref 12.0–15.0)
MCH: 27.7 pg (ref 26.0–34.0)
MCHC: 32.5 g/dL (ref 30.0–36.0)
MCV: 85.3 fL (ref 78.0–100.0)
Platelets: 205 10*3/uL (ref 150–400)
RBC: 3.46 MIL/uL — AB (ref 3.87–5.11)
RDW: 12.2 % (ref 11.5–15.5)
WBC: 12 10*3/uL — ABNORMAL HIGH (ref 4.0–10.5)

## 2018-01-29 LAB — HEMOGLOBIN A1C
HEMOGLOBIN A1C: 17.2 % — AB (ref 4.8–5.6)
MEAN PLASMA GLUCOSE: 446.94 mg/dL

## 2018-01-29 LAB — BASIC METABOLIC PANEL
ANION GAP: 6 (ref 5–15)
BUN: 9 mg/dL (ref 6–20)
CALCIUM: 8 mg/dL — AB (ref 8.9–10.3)
CO2: 23 mmol/L (ref 22–32)
Chloride: 105 mmol/L (ref 98–111)
Creatinine, Ser: 0.49 mg/dL (ref 0.44–1.00)
Glucose, Bld: 231 mg/dL — ABNORMAL HIGH (ref 70–99)
Potassium: 3.8 mmol/L (ref 3.5–5.1)
Sodium: 134 mmol/L — ABNORMAL LOW (ref 135–145)

## 2018-01-29 LAB — GLUCOSE, CAPILLARY
GLUCOSE-CAPILLARY: 216 mg/dL — AB (ref 70–99)
GLUCOSE-CAPILLARY: 146 mg/dL — AB (ref 70–99)
Glucose-Capillary: 94 mg/dL (ref 70–99)
Glucose-Capillary: 264 mg/dL — ABNORMAL HIGH (ref 70–99)

## 2018-01-29 LAB — HIV ANTIBODY (ROUTINE TESTING W REFLEX): HIV Screen 4th Generation wRfx: NONREACTIVE

## 2018-01-29 MED ORDER — SODIUM CHLORIDE 0.9 % IV SOLN
1.5000 g | Freq: Four times a day (QID) | INTRAVENOUS | Status: DC
  Administered 2018-01-29 – 2018-01-30 (×5): 1.5 g via INTRAVENOUS
  Filled 2018-01-29 (×6): qty 1.5

## 2018-01-29 MED ORDER — OXYCODONE-ACETAMINOPHEN 5-325 MG PO TABS
1.0000 | ORAL_TABLET | ORAL | Status: DC | PRN
  Administered 2018-01-29 – 2018-02-01 (×8): 1 via ORAL
  Filled 2018-01-29 (×8): qty 1

## 2018-01-29 MED ORDER — VANCOMYCIN HCL IN DEXTROSE 750-5 MG/150ML-% IV SOLN
750.0000 mg | Freq: Two times a day (BID) | INTRAVENOUS | Status: DC
  Administered 2018-01-30 (×2): 750 mg via INTRAVENOUS
  Filled 2018-01-29 (×2): qty 150

## 2018-01-29 MED ORDER — APIXABAN 5 MG PO TABS
5.0000 mg | ORAL_TABLET | Freq: Two times a day (BID) | ORAL | Status: DC
  Administered 2018-01-29 – 2018-02-01 (×6): 5 mg via ORAL
  Filled 2018-01-29 (×7): qty 1

## 2018-01-29 MED ORDER — VANCOMYCIN HCL 10 G IV SOLR
1250.0000 mg | Freq: Once | INTRAVENOUS | Status: AC
  Administered 2018-01-29: 1250 mg via INTRAVENOUS
  Filled 2018-01-29: qty 1250

## 2018-01-29 MED ORDER — PIPERACILLIN-TAZOBACTAM 3.375 G IVPB: 3.3750 g | Freq: Three times a day (TID) | INTRAVENOUS | Status: DC

## 2018-01-29 MED ORDER — MAGNESIUM SULFATE 2 GM/50ML IV SOLN
2.0000 g | Freq: Once | INTRAVENOUS | Status: AC
  Administered 2018-01-29: 2 g via INTRAVENOUS
  Filled 2018-01-29: qty 50

## 2018-01-29 NOTE — Progress Notes (Signed)
Pharmacy Antibiotic Note  Vilinda BoehringerRosemary Groene is a 62 y.o. female admitted on 01/28/2018 with lightheadedness upon standing.  Found to have labial abscess requiring I&D.  Pharmacy has been consulted for vancomycin and Unasyn dosing.  SCr 0.49, CrCL 68 ml/min, afebrile, WBC up to 12.   Plan: Vanc 1250mg  IV x 1, then 750mg  IV Q12H Unasyn 1.5gm IV Q6H Monitor renal fxn, clinical progress, vanc trough as indicated   Height: 5\' 3"  (160 cm) Weight: 144 lb 13.5 oz (65.7 kg) IBW/kg (Calculated) : 52.4  Temp (24hrs), Avg:98.7 F (37.1 C), Min:98.6 F (37 C), Max:98.9 F (37.2 C)  Recent Labs  Lab 01/28/18 0821 01/29/18 0519  WBC 10.5 12.0*  CREATININE 0.79 0.49    Estimated Creatinine Clearance: 68.1 mL/min (by C-G formula based on SCr of 0.49 mg/dL).    No Known Allergies   Doxy 8/15 >> 8/16 Unasyn 8/16 >> Vanc 8/16 >>  8/15 HIV - negative 8/15 MRSA PCR - positive 8/16 abscess cx -    Tamla Winkels D. Laney Potashang, PharmD, BCPS, BCCCP 01/29/2018, 10:06 AM

## 2018-01-29 NOTE — Consult Note (Signed)
[facility] Consult/Admission Note  Emily Farmer 02-24-1956  [id number].    Requesting MD: Dr. Waldron Labs Chief Complaint/Reason for Consult: multiple abscesses  HPI:   Pt is a 62 yo old female with a hx of CHF, uncontrolled DM type II, a. Fib on Eliquis who was admitted for hyperglycemia. EDP performed multiple I&D's of abscess in groin/mons pubis. We were asked to see abscess to see if they needed more aggressive management. Pt states the abscesses started about a week ago. She has never had them in this area before. She noticed redness and tenderness but no drainage. Associated chills. She denies fever, CP, SOB, abdominal pain. In the ED she was found to be afebrile with no leukocytosis or left shift, with a blood glucose of 600. CT abdomen does not show any concerning infectious process in her genital area.   ROS:  Review of Systems  Constitutional: Positive for chills. Negative for diaphoresis and fever.  HENT: Negative for sore throat.   Respiratory: Negative for cough and shortness of breath.   Cardiovascular: Negative for chest pain.  Gastrointestinal: Negative for abdominal pain, blood in stool, constipation, diarrhea, nausea and vomiting.  Genitourinary: Negative for dysuria.       Abscesses  Skin: Negative for rash.  Neurological: Negative for dizziness and loss of consciousness.  All other systems reviewed and are negative.    Family History  Problem Relation Age of Onset  . Heart attack Mother   . Heart attack Father   . Diabetes Sister   . High blood pressure Neg Hx   . High Cholesterol Neg Hx     Past Medical History:  Diagnosis Date  . Arthritis   . Atrial fibrillation ([place])    a. 09/2016 in setting of pancreatitis;  b. 09/2016 Echo: EF 65-60%, no rwma, Gr2 DD, mild MR, triv TR, PASP 55mHg;  CHA2DS2VASc = 4-->Eliquis 578mBID.  . Marland Kitchenhronic diastolic CHF (congestive heart failure) ([place]/2019  . Diabetes mellitus   . Gout   . Hyperlipidemia     . Hypertension   . Hypertriglyceridemia   . Pancreatitis    a. 09/2016 - Triglycerides 1,392 on admission.    Past Surgical History:  Procedure Laterality Date  . CHOLECYSTECTOMY N/A 01/04/2014   Procedure: LAPAROSCOPIC CHOLECYSTECTOMY WITH INTRAOPERATIVE CHOLANGIOGRAM;  Surgeon: ArRalene OkMD;  [facility]: General;  Laterality: N/A;  . LUNG SURGERY      Social History:  reports that she has never smoked. She has never used smokeless tobacco. She reports that she does not drink alcohol or use drugs.  Allergies: No Known Allergies  Medications Prior to Admission  Medication Sig Dispense Refill  . apixaban (ELIQUIS) 5 MG TABS tablet Take 1 tablet (5 mg total) by mouth 2 (two) times daily. 60 tablet 0  . aspirin EC 81 MG tablet Take 81 mg by mouth 2 (two) times daily.    . Marland Kitchentorvastatin (LIPITOR) 10 MG tablet Take 1 tablet (10 mg total) by mouth daily. 30 tablet 11  . fenofibrate 160 MG tablet Take 1 tablet (160 mg total) by mouth daily. 30 tablet 11  . metFORMIN (GLUCOPHAGE) 500 MG tablet Take 1 tablet (500 mg total) by mouth 2 (two) times daily with a meal. 60 tablet 0  . metoprolol tartrate (LOPRESSOR) 25 MG tablet Take 1 tablet (25 mg total) by mouth 2 (two) times daily. 60 tablet 11  . Multiple Vitamins-Minerals (CENTRUM SILVER PO) Take 1 tablet by mouth daily.    .Marland Kitchen  omega-3 acid ethyl esters (LOVAZA) 1 g capsule Take 2 capsules (2 g total) by mouth 2 (two) times daily. (Patient taking differently: Take 1 g by mouth 2 (two) times daily. ) 120 capsule 11  . pantoprazole (PROTONIX) 40 MG tablet Take 1 tablet (40 mg total) by mouth daily. 30 tablet 11  . insulin glargine (LANTUS) 100 UNIT/ML injection Inject 0.12 mLs (12 Units total) into the skin at bedtime. 10 mL 0    Blood pressure (!) 119/56, pulse 76, temperature 98.7 F (37.1 C), temperature source Oral, resp. rate 19, height '5\' 3"'  (1.6 m), weight 65.7 kg, SpO2 94 %.  Physical Exam  Constitutional: She is  oriented to person, place, and time. She appears well-developed and well-nourished. No distress.  HENT:  Head: Normocephalic and atraumatic.  Nose: Nose normal.  Mouth/Throat: Oropharynx is clear and moist and mucous membranes are normal. No oropharyngeal exudate.  Eyes: Pupils are equal, round, and reactive to light. Conjunctivae are normal. Right eye exhibits no discharge. Left eye exhibits no discharge. No scleral icterus.  Neck: Normal range of motion. Neck supple. No thyromegaly present.  Cardiovascular: Normal rate, regular rhythm, normal heart sounds and intact distal pulses.  No murmur heard. Pulses:      Radial pulses are 2+ on the right side, and 2+ on the left side.  Pulmonary/Chest: Effort normal and breath sounds normal. No respiratory distress. She has no wheezes. She has no rhonchi. She has no rales.  Abdominal: Soft. Bowel sounds are normal. She exhibits no distension. There is no hepatosplenomegaly. There is no tenderness. There is no rigidity and no guarding.  Genitourinary:  Genitourinary Comments: Multiple small abscesses of L mons pubis and in her perineum. Two superior abscess are draining purulent drainage. See photo below. Mild surrounding erythema and induration. No fluctuance noted. Small abscess (<1cm)  more posterior is without surrounding erythema but indurated and not draining  Musculoskeletal: Normal range of motion. She exhibits no edema, tenderness or deformity.  Lymphadenopathy:    She has no cervical adenopathy.  Neurological: She is alert and oriented to person, place, and time.  Skin: Skin is warm and dry. No rash noted. She is not diaphoretic.  Psychiatric: She has a normal mood and affect.  Nursing note and vitals reviewed.      Results for orders placed or performed during the hospital encounter of 01/28/18 (from the past 48 hour(s))  Urinalysis, Routine w reflex microscopic     Status: Abnormal   Collection Time: 01/28/18  7:27 AM  Result Value  Ref Range   Color, Urine YELLOW YELLOW   APPearance CLEAR CLEAR   Specific Gravity, Urine 1.028 1.005 - 1.030   pH 5.0 5.0 - 8.0   Glucose, UA >=500 (A) NEGATIVE mg/dL   Hgb urine dipstick SMALL (A) NEGATIVE   Bilirubin Urine NEGATIVE NEGATIVE   Ketones, ur NEGATIVE NEGATIVE mg/dL   Protein, ur NEGATIVE NEGATIVE mg/dL   Nitrite NEGATIVE NEGATIVE   Leukocytes, UA NEGATIVE NEGATIVE   RBC / HPF 0-5 0 - 5 RBC/hpf   WBC, UA 0-5 0 - 5 WBC/hpf   Bacteria, UA RARE (A) NONE SEEN   Mucus PRESENT     Comment: Performed at [facility], [address], [place],  [address]  CBG monitoring, ED     Status: Abnormal   Collection Time: 01/28/18  7:33 AM  Result Value Ref Range   Glucose-Capillary 560 (HH) 70 - 99 mg/dL   Comment 1 Notify RN  Basic metabolic panel     Status: Abnormal   Collection Time: 01/28/18  8:21 AM  Result Value Ref Range   Sodium 130 (L) 135 - 145 mmol/L   Potassium 4.4 3.5 - 5.1 mmol/L   Chloride 95 (L) 98 - 111 mmol/L   CO2 23 22 - 32 mmol/L   Glucose, Bld 563 (HH) 70 - 99 mg/dL    Comment: CRITICAL RESULT CALLED TO, READ BACK BY AND VERIFIED WITH: Thurmond Butts [id number] WILDERK    BUN 22 (H) 6 - 20 mg/dL   Creatinine, Ser 0.79 0.44 - 1.00 mg/dL   Calcium 9.0 8.9 - 10.3 mg/dL   GFR calc non Af Amer >60 >60 mL/min   GFR calc Af Amer >60 >60 mL/min    Comment: (NOTE) The eGFR has been calculated using the CKD EPI equation. This calculation has not been validated in all clinical situations. eGFR's persistently <60 mL/min signify possible Chronic Kidney Disease.    Anion gap 12 5 - 15    Comment: Performed at [facility] [address] [place], [place] [address]  CBC     Status: Abnormal   Collection Time: 01/28/18  8:21 AM  Result Value Ref Range   WBC 10.5 4.0 - 10.5 K/uL   RBC 4.00 3.87 - 5.11 MIL/uL   Hemoglobin 11.2 (L) 12.0 - 15.0 g/dL   HCT 33.7 (L) 36.0 - 46.0 %   MCV 84.3 78.0 - 100.0 fL   MCH 28.0 26.0 - 34.0 pg   MCHC 33.2  30.0 - 36.0 g/dL   RDW 11.9 11.5 - 15.5 %   Platelets 207 150 - 400 K/uL    Comment: Performed at [facility], [place] [address] [place], [place] [address]  CBG monitoring, ED     Status: Abnormal   Collection Time: 01/28/18 12:24 PM  Result Value Ref Range   Glucose-Capillary 343 (H) 70 - 99 mg/dL  CBG monitoring, ED     Status: Abnormal   Collection Time: 01/28/18  3:28 PM  Result Value Ref Range   Glucose-Capillary 261 (H) 70 - 99 mg/dL  MRSA PCR Screening     Status: Abnormal   Collection Time: 01/28/18  4:50 PM  Result Value Ref Range   MRSA by PCR POSITIVE (A) NEGATIVE    Comment:        The GeneXpert MRSA Assay (FDA approved for NASAL specimens only), is one component of a comprehensive MRSA colonization surveillance program. It is not intended to diagnose MRSA infection nor to guide or monitor treatment for MRSA infections. RESULT CALLED TO, READ BACK BY AND VERIFIED WITH: E CHEEK RN 01/28/18 1856 JDW Performed at [facility], [place] [address] [place], [place] [address]   Glucose, capillary     Status: Abnormal   Collection Time: 01/28/18  4:56 PM  Result Value Ref Range   Glucose-Capillary 236 (H) 70 - 99 mg/dL  Magnesium     Status: Abnormal   Collection Time: 01/28/18  5:15 PM  Result Value Ref Range   Magnesium 1.4 (L) 1.7 - 2.4 mg/dL    Comment: Performed at [facility] [address], [place], [place] [address]  HIV antibody (Routine Testing)     Status: None   Collection Time: 01/28/18  5:15 PM  Result Value Ref Range   HIV Screen 4th Generation wRfx Non Reactive Non Reactive    Comment: (NOTE) Performed At: [facility] [address]  [address] [place], [place] [id number] Rush Farmer MD CZ:[id number]   Hemoglobin A1c     Status: Abnormal   Collection Time: 01/28/18  5:15 PM  Result Value Ref Range   Hgb A1c MFr Bld 17.2 (H) 4.8 - 5.6 %    Comment: (NOTE) Pre diabetes:          5.7%-6.4% Diabetes:              >6.4% Glycemic  control for   <7.0% adults with diabetes    Mean Plasma Glucose 446.94 mg/dL    Comment: Performed at [facility] [address], [place], [place] [address]  Glucose, capillary     Status: Abnormal   Collection Time: 01/28/18  6:18 PM  Result Value Ref Range   Glucose-Capillary 190 (H) 70 - 99 mg/dL  Glucose, capillary     Status: Abnormal   Collection Time: 01/28/18  9:54 PM  Result Value Ref Range   Glucose-Capillary 190 (H) 70 - 99 mg/dL  Basic metabolic panel     Status: Abnormal   Collection Time: 01/29/18  5:19 AM  Result Value Ref Range   Sodium 134 (L) 135 - 145 mmol/L   Potassium 3.8 3.5 - 5.1 mmol/L   Chloride 105 98 - 111 mmol/L   CO2 23 22 - 32 mmol/L   Glucose, Bld 231 (H) 70 - 99 mg/dL   BUN 9 6 - 20 mg/dL   Creatinine, Ser 0.49 0.44 - 1.00 mg/dL   Calcium 8.0 (L) 8.9 - 10.3 mg/dL   GFR calc non Af Amer >60 >60 mL/min   GFR calc Af Amer >60 >60 mL/min    Comment: (NOTE) The eGFR has been calculated using the CKD EPI equation. This calculation has not been validated in all clinical situations. eGFR's persistently <60 mL/min signify possible Chronic Kidney Disease.    Anion gap 6 5 - 15    Comment: Performed at [facility] [address] [place], [place] [address]  CBC     Status: Abnormal   Collection Time: 01/29/18  5:19 AM  Result Value Ref Range   WBC 12.0 (H) 4.0 - 10.5 K/uL   RBC 3.46 (L) 3.87 - 5.11 MIL/uL   Hemoglobin 9.6 (L) 12.0 - 15.0 g/dL   HCT 29.5 (L) 36.0 - 46.0 %   MCV 85.3 78.0 - 100.0 fL   MCH 27.7 26.0 - 34.0 pg   MCHC 32.5 30.0 - 36.0 g/dL   RDW 12.2 11.5 - 15.5 %   Platelets 205 150 - 400 K/uL    Comment: Performed at [facility], [place] [address] [place], [place] [address]  Glucose, capillary     Status: Abnormal   Collection Time: 01/29/18  8:12 AM  Result Value Ref Range   Glucose-Capillary 216 (H) 70 - 99 mg/dL   No results found.    Assessment/Plan  Active Problems:   Hyperglycemia   Essential  hypertension   Diabetes mellitus type 2 in obese (HCC)   Paroxysmal A-fib (HCC)   Hyperglycemic hyperosmolar nonketotic coma (HCC)   Atrial fibrillation with RVR (HCC)   Non-compliance   Hyponatremia   Normocytic anemia   Chronic diastolic CHF (congestive heart failure) (HCC)   Labial abscess   Orthostatic hypotension  Abscesses - EDP performed I&D and abscesses are packed with iodoform - I do not feel these need management in the OR - recommend warm soaks, shower, change iodoform once daily, continue abx, and we will  re-evaluate wounds tomorrow   Thank you for the consult. We will follow.   Kalman Drape, [facility] 01/29/2018, 9:24 AM Pager: [phone] Consults: [phone] Mon-Fri 7:00 am-4:30 pm Sat-Sun 7:00 am-11:30 am

## 2018-01-29 NOTE — Consult Note (Signed)
WOC Nurse wound consult note [phone] in the presence of Dr. Randol KernElgergawy and her primary care RN. Reason for Consult: Four abscesses that were I&Dd in the ED yesterday.  Three of these on the mons pubis had packing.  The fourth, located at the base of the left labia majora, did not have packing.  Dr. Randol KernElgergawy was able to express purulent drainage from the middle abscess on the mons.  A culture of the drainage was obtained and prepared for transport to the lab.  Dr. Randol KernElgergawy stated he wishes to cancel the WOC consult and reach out to surgery for surgical evaluation of the areas. WOC nurse will not follow at this time.  Please re-consult the WOC team if needed.  Emily MusterSherry Leaira Fullam, RN, MSN, CWOCN, CNS-BC, pager [phone]

## 2018-01-29 NOTE — Progress Notes (Signed)
PROGRESS NOTE                                                                                                                                                                                                             Patient Demographics:    Emily Farmer, is a 62 y.o. female, DOB - 04-04-1956, ZOX:[id number]  Admit date - 01/28/2018   Admitting Physician Marinda Elk, MD  Outpatient Primary MD for the patient is Fleet Contras, MD  LOS - 1   Chief Complaint  Patient presents with  . Hyperglycemia       Brief Narrative    62 y.o. female with medical history significant of past medical history of chronic diastolic heart failure, diabetes mellitus paroxysmal atrial fibrillation on Eliquis who came into the ED as she started feeling lightheaded upon standing that started on the day of admission, work-up significant for hyperosmolar hyperglycemic nonketotic state and mons pubis abscess   Subjective:    [name] today has, No headache, No chest pain, No abdominal pain - No Nausea,    Assessment  & Plan :    Active Problems:   Hyperglycemia   Essential hypertension   Diabetes mellitus type 2 in obese (HCC)   Paroxysmal A-fib (HCC)   Hyperglycemic hyperosmolar nonketotic coma (HCC)   Atrial fibrillation with RVR (HCC)   Non-compliance   Hyponatremia   Normocytic anemia   Chronic diastolic CHF (congestive heart failure) (HCC)   Labial abscess   Orthostatic hypotension   Hyperglycemic hyperosmolar hyperglycemia (HCC) -This is most likely in the setting of noncompliance with her medication and section -No evidence of DKA on admission, anion gap within normal limits, she was started on insulin drip, started on Lantus 15 units nightly, will transition to Lantus with insulin sliding scale. -A1c significantly elevated at 17.2  A. fib with RVR/paroxysmal atrial fibrillation: -Patient with paroxysmal A. fib during hospital stay, and  out of A. fib, and with metoprolol for heart rate control, continue with Eliquis, but will hold this morning dose pending surgery consult.  Monus pubis abscesses: -This is most likely due to poorly controlled diabetes mellitus, status post bedside incision incision and drainage by ED physician seen and examined today with the wound care nurse, still able to express some purulent discharge, fear of these abscesses may be connected, I have  consulted general surgery. -We antibiotic coverage from doxycycline to vancomycin and Unasyn when she is MRSA PCR positive.  Orthostatic hypotension/essential hypertension: -Volume depletion and dehydration, improved with IV fluids,continue  with metoprolol, hold other meds   Normocytic anemia: Previous hemoglobin was 9.7 hemoglobin seems to be close to baseline she denies any melanotic stools.   Chronic diastolic CHF (congestive heart failure) (HCC): - She is on no diuretics at home seems to be actually hypovolemic, will continue metoprolol for rate control.  Hypomagnesemia -Repleted  Code Status : Full  Family Communication  : None at bedside  Disposition Plan  : Home once stable  Consults  : General surgery  Procedures  : I&D by 8 physician of local abscess and mons pubis  DVT Prophylaxis  : On Eliquis  Lab Results  Component Value Date   PLT 205 01/29/2018    Antibiotics  :    Anti-infectives (From admission, onward)   Start     Dose/Rate Route Frequency Ordered Stop   01/28/18 2200  doxycycline (VIBRA-TABS) tablet 100 mg  Status:  Discontinued     100 mg Oral Every 12 hours 01/28/18 1653 01/29/18 0929        Objective:   Vitals:   01/28/18 2025 01/29/18 0020 01/29/18 0057 01/29/18 0451  BP:   (!) 119/56   Pulse:   76   Resp:   19   Temp: 98.7 F (37.1 C) 98.7 F (37.1 C) 98.6 F (37 C) 98.7 F (37.1 C)  TempSrc: Oral Oral Oral Oral  SpO2:   94%   Weight:    65.7 kg  Height:        Wt Readings from Last 3  Encounters:  01/29/18 65.7 kg  12/31/17 72.6 kg  06/01/17 66.6 kg     Intake/Output Summary (Last 24 hours) at 01/29/2018 0930 Last data filed at 01/29/2018 0800 Gross per 24 hour  Intake 1098.4 ml  Output 850 ml  Net 248.4 ml     Physical Exam   Awake Alert, Oriented X 3, No new F.N deficits, Normal affect Symmetrical Chest wall movement, Good air movement bilaterally, CTAB RRR,No Gallops,Rubs or new Murmurs, No Parasternal Heave +ve B.Sounds, Abd Soft, No tenderness, No rebound - guarding or rigidity, patient had area status post incision and drainage in the mons pubis, with some packing, was able to express some purulent drainage from the middle abscess on the mons pubis. No Cyanosis, Clubbing or edema, No new Rash or bruise      Data Review:    CBC Recent Labs  Lab 01/28/18 0821 01/29/18 0519  WBC 10.5 12.0*  HGB 11.2* 9.6*  HCT 33.7* 29.5*  PLT 207 205  MCV 84.3 85.3  MCH 28.0 27.7  MCHC 33.2 32.5  RDW 11.9 12.2    Chemistries  Recent Labs  Lab 01/28/18 0821 01/28/18 1715 01/29/18 0519  NA 130*  --  134*  K 4.4  --  3.8  CL 95*  --  105  CO2 23  --  23  GLUCOSE 563*  --  231*  BUN 22*  --  9  CREATININE 0.79  --  0.49  CALCIUM 9.0  --  8.0*  MG  --  1.4*  --    ------------------------------------------------------------------------------------------------------------------ No results for input(s): CHOL, HDL, LDLCALC, TRIG, CHOLHDL, LDLDIRECT in the last 72 hours.  Lab Results  Component Value Date   HGBA1C 17.2 (H) 01/28/2018   ------------------------------------------------------------------------------------------------------------------ No results for input(s): TSH, T4TOTAL, T3FREE, THYROIDAB  in the last 72 hours.  Invalid input(s): FREET3 ------------------------------------------------------------------------------------------------------------------ No results for input(s): VITAMINB12, FOLATE, FERRITIN, TIBC, IRON, RETICCTPCT in the  last 72 hours.  Coagulation profile No results for input(s): INR, PROTIME in the last 168 hours.  No results for input(s): DDIMER in the last 72 hours.  Cardiac Enzymes No results for input(s): CKMB, TROPONINI, MYOGLOBIN in the last 168 hours.  Invalid input(s): CK ------------------------------------------------------------------------------------------------------------------ No results found for: BNP  Inpatient Medications  Scheduled Meds: . aspirin EC  81 mg Oral BID  . atorvastatin  10 mg Oral q1800  . Chlorhexidine Gluconate Cloth  6 each Topical Q0600  . fenofibrate  160 mg Oral Daily  . insulin aspart  0-15 Units Subcutaneous TID WC  . insulin aspart  0-5 Units Subcutaneous QHS  . insulin aspart  3 Units Subcutaneous TID WC  . insulin glargine  15 Units Subcutaneous QHS  . metoprolol tartrate  25 mg Oral BID  . mupirocin ointment  1 application Nasal BID  . omega-3 acid ethyl esters  2 g Oral BID  . pantoprazole  40 mg Oral Daily   Continuous Infusions: . sodium chloride 75 mL/hr at 01/29/18 0053  . sodium chloride    . magnesium sulfate 1 - 4 g bolus IVPB     PRN Meds:.acetaminophen **OR** acetaminophen, dextrose, metoprolol tartrate, oxyCODONE-acetaminophen, polyethylene glycol  Micro Results Recent Results (from the past 240 hour(s))  MRSA PCR Screening     Status: Abnormal   Collection Time: 01/28/18  4:50 PM  Result Value Ref Range Status   MRSA by PCR POSITIVE (A) NEGATIVE Final    Comment:        The GeneXpert MRSA Assay (FDA approved for NASAL specimens only), is one component of a comprehensive MRSA colonization surveillance program. It is not intended to diagnose MRSA infection nor to guide or monitor treatment for MRSA infections. RESULT CALLED TO, READ BACK BY AND VERIFIED WITH: E CHEEK RN 01/28/18 1856 JDW Performed at [facility], [address] [place], [place] [address]     Radiology Reports No results found.   Huey Bienenstockawood  Jaeson Molstad M.D on 01/29/2018 at 9:30 AM  Between 7am to 7pm - Pager - [phone]  After 7pm go to [email or web address] - password [email or web address]  [facility] -  Office  [phone]

## 2018-01-30 LAB — GLUCOSE, CAPILLARY
GLUCOSE-CAPILLARY: 197 mg/dL — AB (ref 70–99)
GLUCOSE-CAPILLARY: 159 mg/dL — AB (ref 70–99)
GLUCOSE-CAPILLARY: 173 mg/dL — AB (ref 70–99)
Glucose-Capillary: 121 mg/dL — ABNORMAL HIGH (ref 70–99)

## 2018-01-30 LAB — BASIC METABOLIC PANEL
Anion gap: 8 (ref 5–15)
BUN: 6 mg/dL (ref 6–20)
CHLORIDE: 103 mmol/L (ref 98–111)
CO2: 24 mmol/L (ref 22–32)
Calcium: 8.1 mg/dL — ABNORMAL LOW (ref 8.9–10.3)
Creatinine, Ser: 0.57 mg/dL (ref 0.44–1.00)
GFR calc non Af Amer: >60 mL/min (ref >60)
Glucose, Bld: 213 mg/dL — ABNORMAL HIGH (ref 70–99)
POTASSIUM: 3.5 mmol/L (ref 3.5–5.1)
SODIUM: 135 mmol/L (ref 135–145)

## 2018-01-30 LAB — CBC
HCT: 27.6 % — ABNORMAL LOW (ref 36.0–46.0)
HEMOGLOBIN: 9 g/dL — AB (ref 12.0–15.0)
MCH: 28.1 pg (ref 26.0–34.0)
MCHC: 32.6 g/dL (ref 30.0–36.0)
MCV: 86.3 fL (ref 78.0–100.0)
Platelets: 211 10*3/uL (ref 150–400)
RBC: 3.2 MIL/uL — AB (ref 3.87–5.11)
RDW: 12.3 % (ref 11.5–15.5)
WBC: 9.1 10*3/uL (ref 4.0–10.5)

## 2018-01-30 MED ORDER — INSULIN ASPART PROT & ASPART (70-30 MIX) 100 UNIT/ML ~~LOC~~ SUSP
12.0000 [IU] | Freq: Two times a day (BID) | SUBCUTANEOUS | Status: DC
  Administered 2018-01-30 – 2018-01-31 (×2): 12 [IU] via SUBCUTANEOUS
  Filled 2018-01-30: qty 10

## 2018-01-30 MED ORDER — VANCOMYCIN HCL IN DEXTROSE 750-5 MG/150ML-% IV SOLN
750.0000 mg | Freq: Two times a day (BID) | INTRAVENOUS | Status: DC
  Administered 2018-01-30 – 2018-02-01 (×4): 750 mg via INTRAVENOUS
  Filled 2018-01-30 (×4): qty 150

## 2018-01-30 NOTE — Progress Notes (Addendum)
Pt was unable to change dressings due to vision problems.  Pt states she got her glasses approx  5 months ago, but has been unable to see clearly since then.    Pt may need home health to change her dressings, or she may need to go to an outpatient clinic.  Pt was given a CHG bath after dressing change, gown and linens were changed.

## 2018-01-30 NOTE — Plan of Care (Signed)
  RD consulted for nutrition education regarding diabetes.  Lab Results  Component Value Date   HGBA1C 17.2 (H) 01/28/2018   Spoke with patient at bedside. She reports her sister is a diabetic as well so they try to follow a strict diet at home. Patient normally eats eggs and oatmeal for breakfast, a sandwich for lunch, and stew or something along those lines for dinner. Per diabetes coordinator, it appears patient was trying to make her "lantus last" after she lost medicaid when her son turned 6118. Appears glycemic control is related to access to medication more than anything else.  RD provided "Carbohydrate Counting for People with Diabetes" handout from the Academy of [employer] and Dietetics. Discussed different food groups and their effects on blood sugar, emphasizing carbohydrate-containing foods. Provided list of carbohydrates and recommended serving sizes of common foods.  Discussed importance of controlled and consistent carbohydrate intake throughout the day. Provided examples of ways to balance meals/snacks and encouraged intake of high-fiber, whole grain complex carbohydrates. Teach back method used.  Expect fair compliance.  Body mass index is 25.66 kg/m. Pt meets criteria for overweight based on current BMI.  Current diet order is carb modified, patient is consuming approximately 100% of meals at this time. Labs and medications reviewed. No further nutrition interventions warranted at this time. RD contact information provided. If additional nutrition issues arise, please re-consult RD.  Dionne AnoWilliam M. Raelea Gosse, MS, RD LDN Inpatient Clinical Dietitian Pager [phone]

## 2018-01-30 NOTE — Progress Notes (Signed)
Inpatient Diabetes Program Recommendations  AACE/ADA: New Consensus Statement on Inpatient Glycemic Control (2015)  Target Ranges:  Prepandial:   less than 140 mg/dL      Peak postprandial:   less than 180 mg/dL (1-2 hours)      Critically ill patients:  140 - 180 mg/dL   Lab Results  Component Value Date   GLUCAP 197 (H) 01/30/2018   HGBA1C 17.2 (H) 01/28/2018    Review of Glycemic Control  LATE ENTRY from 01/29/2018  Diabetes history: DM2 Outpatient Diabetes medications: metformin 500 mg bid, Lantus 12 units QHS (has been out since losing Medicaid) Current orders for Inpatient glycemic control: Lantus 15 units QHS, Novolog 0-15 units tidwc and hs + 3 units tidwc  HgbA1C - 17.2% - uncontrolled Spoke with pt regarding HgbA1C of 17.2%. Pt states her Medicaid ran out when her son turned 2318 and she has been "trying to stretch the Lantus" and then ended up running out. States her HgbA1C had been 7% at one time. Will need affordable insulin at discharge. Lengthy discussion about importance of taking her insulin as prescribed and checking her blood sugars 2-3x/day. Dr. Concepcion ElkAvbuere manages her diabetes according to pt. We discussed importance of controlling blood sugars to promote healing of abscesses. Pt reiterated need for affordable insulin at discharge.  Received Lantus15 units and Novolog 20 units on 8/17. FBS on 8/18 - 197 mg/dL.   Inpatient Diabetes Program Recommendations:     Novolin 70/30 12 units bid. (total basal - 16.8 units/day) OP Diabetes Education consult for uncontrolled DM2 F/U with PCP within 1 week of discharge.   Will need prescription for syringes and strips/lancets.  Thank you. Ailene Ardshonda Johnothan Bascomb, RD, LDN, CDE Inpatient Diabetes Coordinator [phone]

## 2018-01-30 NOTE — Plan of Care (Signed)
Wound dressing done no new complain

## 2018-01-30 NOTE — Progress Notes (Signed)
[facility] Progress Note     Subjective: CC-  Feeling well this morning. Wound draining appropriately and patient states they are less painful. WBC WNL, afebrile.  Objective: Vital signs in last 24 hours: Temp:  [97.8 F (36.6 C)-98.6 F (37 C)] 98.6 F (37 C) (08/16 2100) Pulse Rate:  [68-74] 68 (08/17 0526) Resp:  [16-17] 17 (08/17 0526) BP: (107-115)/(54-59) 115/54 (08/17 0526) SpO2:  [95 %-97 %] 95 % (08/17 0526) Last BM Date: 01/28/18  Intake/Output from previous day: 08/16 0701 - 08/17 0700 In: 1332.4 [P.O.:320; I.V.:1012.4] Out: -  Intake/Output this shift: Total I/O In: 940 [P.O.:840; IV Piggyback:100] Out: -   PE: Gen:  Alert, NAD, pleasant Pulm: effort normal Abd: Soft, NT/ND, +BS GU: mons pubis abscess s/p I&D with 3 small openings and packing strips in place, no cellulitis, mild induration around proximal incision, trace purulent drainage Psych: A&Ox3  Skin: no rashes noted, warm and dry  Lab Results:  Recent Labs    01/29/18 0519 01/30/18 0339  WBC 12.0* 9.1  HGB 9.6* 9.0*  HCT 29.5* 27.6*  PLT 205 211   BMET Recent Labs    01/29/18 0519 01/30/18 0339  NA 134* 135  K 3.8 3.5  CL 105 103  CO2 23 24  GLUCOSE 231* 213*  BUN 9 6  CREATININE 0.49 0.57  CALCIUM 8.0* 8.1*   PT/INR No results for input(s): LABPROT, INR in the last 72 hours. CMP     Component Value Date/Time   NA 135 01/30/2018 0339   K 3.5 01/30/2018 0339   CL 103 01/30/2018 0339   CO2 24 01/30/2018 0339   GLUCOSE 213 (H) 01/30/2018 0339   BUN 6 01/30/2018 0339   CREATININE 0.57 01/30/2018 0339   CREATININE 0.64 11/21/2016 0822   CALCIUM 8.1 (L) 01/30/2018 0339   PROT 6.5 12/31/2017 1109   ALBUMIN 3.3 (L) 12/31/2017 1109   AST 17 12/31/2017 1109   ALT 13 12/31/2017 1109   ALKPHOS 73 12/31/2017 1109   BILITOT 0.5 12/31/2017 1109   GFRNONAA >60 01/30/2018 0339   GFRAA >60 01/30/2018 0339   Lipase     Component Value Date/Time   LIPASE 126 (H)  10/05/2016 0601       Studies/Results: No results found.  Anti-infectives: Anti-infectives (From admission, onward)   Start     Dose/Rate Route Frequency Ordered Stop   01/29/18 2300  vancomycin (VANCOCIN) IVPB 750 mg/150 ml premix     750 mg 150 mL/hr over 60 Minutes Intravenous Every 12 hours 01/29/18 1006     01/29/18 1000  vancomycin (VANCOCIN) 1,250 mg in sodium chloride 0.9 % 250 mL IVPB     1,250 mg 166.7 mL/hr over 90 Minutes Intravenous  Once 01/29/18 0948 01/29/18 1330   01/29/18 1000  piperacillin-tazobactam (ZOSYN) IVPB 3.375 g  Status:  Discontinued     3.375 g 12.5 mL/hr over 240 Minutes Intravenous Every 8 hours 01/29/18 0948 01/29/18 0958   01/29/18 1000  ampicillin-sulbactam (UNASYN) 1.5 g in sodium chloride 0.9 % 100 mL IVPB     1.5 g 200 mL/hr over 30 Minutes Intravenous Every 6 hours 01/29/18 0958     01/28/18 2200  doxycycline (VIBRA-TABS) tablet 100 mg  Status:  Discontinued     100 mg Oral Every 12 hours 01/28/18 1653 01/29/18 0929       Assessment/Plan Mons pubis abscess  S/p I&D in ED 8/15 - Continue heat packs, antibiotics. Change packing once daily. Shower with wounds opens.  Wound not recommend surgery. Plan to f/u outpatient in about 1 week. General surgery will sign off, call with concerns.   LOS: 2 days    Franne FortsBrooke A Meuth , [facility] 01/30/2018, 11:29 AM Pager: [phone] Consults: [phone] Mon 7:00 am -11:30 AM Tues-Fri 7:00 am-4:30 pm Sat-Sun 7:00 am-11:30 am

## 2018-01-30 NOTE — Progress Notes (Signed)
PROGRESS NOTE                                                                                                                                                                                                             Patient Demographics:    Emily Farmer, is a 62 y.o. female, DOB - [birth year], ZOX:[id number]  Admit date - 01/28/2018   Admitting Physician Marinda Elk, MD  Outpatient Primary MD for the patient is No primary care provider on file.  LOS - 2   Chief Complaint  Patient presents with  . Hyperglycemia       Brief Narrative    62 y.o. female with medical history significant of past medical history of chronic diastolic heart failure, diabetes mellitus paroxysmal atrial fibrillation on Eliquis who came into the ED as she started feeling lightheaded upon standing that started on the day of admission, work-up significant for hyperosmolar hyperglycemic nonketotic state and mons pubis abscess   Subjective:    [name] today has, No headache, No chest pain, No abdominal pain - No Nausea,    Assessment  & Plan :    Active Problems:   Hyperglycemia   Essential hypertension   Diabetes mellitus type 2 in obese (HCC)   Paroxysmal A-fib (HCC)   Hyperglycemic hyperosmolar nonketotic coma (HCC)   Atrial fibrillation with RVR (HCC)   Non-compliance   Hyponatremia   Normocytic anemia   Chronic diastolic CHF (congestive heart failure) (HCC)   Labial abscess   Orthostatic hypotension   Hyperosmolar non-ketotic state in patient with type 2 diabetes mellitus (HCC)   Hyperglycemic hyperosmolar hyperglycemia (HCC) -This is most likely in the setting of noncompliance with her medication and section -No evidence of DKA on admission, anion gap within normal limits, she was started on insulin drip, started on Lantus 15 units nightly, diabetic coordinator input greatly appreciated, Novolin 70/30 12 units twice daily at this more  affordable and ensure compliance as an outpatient. -A1c significantly elevated at 17.2  A. fib with RVR/paroxysmal atrial fibrillation: -Patient with paroxysmal A. fib during hospital stay, and out of A. fib, and with metoprolol for heart rate control, continue with Eliquis, but will hold this morning dose pending surgery consult.  Monus pubis abscesses: -This is most likely due to poorly controlled diabetes mellitus, status post bedside incision incision and  drainage by ED physician seen and examined today with the wound care nurse, still able to express some purulent discharge, fear of these abscesses may be connected, neurosurgery consult appreciated, no indication for surgery, continue with wound dressing changes with packing 2 times daily -Initially doxycycline, transitioned to vancomycin and Unasyn given she is MRSA PCR positive, abscess culture growing gram-positive cocci, I will discontinue Unasyn  Orthostatic hypotension/essential hypertension: -Volume depletion and dehydration, improved with IV fluids,continue  with metoprolol, hold other meds   Normocytic anemia: Previous hemoglobin was 9.7 hemoglobin seems to be close to baseline she denies any melanotic stools.   Chronic diastolic CHF (congestive heart failure) (HCC): - She is on no diuretics at home seems to be actually hypovolemic, will continue metoprolol for rate control.  Hypomagnesemia -Repleted  Code Status : Full  Family Communication  : None at bedside  Disposition Plan  : Home once stable  Consults  : General surgery  Procedures  : I&D by 8 physician of local abscess and mons pubis  DVT Prophylaxis  : On Eliquis  Lab Results  Component Value Date   PLT 211 01/30/2018    Antibiotics  :    Anti-infectives (From admission, onward)   Start     Dose/Rate Route Frequency Ordered Stop   01/29/18 2300  vancomycin (VANCOCIN) IVPB 750 mg/150 ml premix     750 mg 150 mL/hr over 60 Minutes Intravenous  Every 12 hours 01/29/18 1006     01/29/18 1000  vancomycin (VANCOCIN) 1,250 mg in sodium chloride 0.9 % 250 mL IVPB     1,250 mg 166.7 mL/hr over 90 Minutes Intravenous  Once 01/29/18 0948 01/29/18 1330   01/29/18 1000  piperacillin-tazobactam (ZOSYN) IVPB 3.375 g  Status:  Discontinued     3.375 g 12.5 mL/hr over 240 Minutes Intravenous Every 8 hours 01/29/18 0948 01/29/18 0958   01/29/18 1000  ampicillin-sulbactam (UNASYN) 1.5 g in sodium chloride 0.9 % 100 mL IVPB     1.5 g 200 mL/hr over 30 Minutes Intravenous Every 6 hours 01/29/18 0958     01/28/18 2200  doxycycline (VIBRA-TABS) tablet 100 mg  Status:  Discontinued     100 mg Oral Every 12 hours 01/28/18 1653 01/29/18 0929        Objective:   Vitals:   01/29/18 1537 01/29/18 2100 01/30/18 0526 01/30/18 1326  BP: (!) 112/57 (!) 107/59 (!) 115/54 127/69  Pulse: 74 74 68 68  Resp: 16 17 17 17   Temp: 97.8 F (36.6 C) 98.6 F (37 C)  98.2 F (36.8 C)  TempSrc: Oral Oral  Oral  SpO2: 96% 97% 95% 97%  Weight:      Height:        Wt Readings from Last 3 Encounters:  01/29/18 65.7 kg  12/31/17 72.6 kg  06/01/17 66.6 kg     Intake/Output Summary (Last 24 hours) at 01/30/2018 1330 Last data filed at 01/30/2018 1100 Gross per 24 hour  Intake 1260 ml  Output -  Net 1260 ml     Physical Exam  Seen and examined by the presence of her nurse Lauri  Awake Alert, Oriented X 3, No new F.N deficits, Normal affect Symmetrical Chest wall movement, Good air movement bilaterally, CTAB RRR,No Gallops,Rubs or new Murmurs, No Parasternal Heave Bowel sounds present, no rebound, no guarding, the distinct abscess areas on mons pubis, with some packing, was able to express some purulent drainage from the middle abscess on the mons pubis. No Cyanosis, Clubbing  or edema, No new Rash or bruise      Data Review:    CBC Recent Labs  Lab 01/28/18 0821 01/29/18 0519 01/30/18 0339  WBC 10.5 12.0* 9.1  HGB 11.2* 9.6* 9.0*  HCT 33.7*  29.5* 27.6*  PLT 207 205 211  MCV 84.3 85.3 86.3  MCH 28.0 27.7 28.1  MCHC 33.2 32.5 32.6  RDW 11.9 12.2 12.3    Chemistries  Recent Labs  Lab 01/28/18 0821 01/28/18 1715 01/29/18 0519 01/30/18 0339  NA 130*  --  134* 135  K 4.4  --  3.8 3.5  CL 95*  --  105 103  CO2 23  --  23 24  GLUCOSE 563*  --  231* 213*  BUN 22*  --  9 6  CREATININE 0.79  --  0.49 0.57  CALCIUM 9.0  --  8.0* 8.1*  MG  --  1.4*  --   --    ------------------------------------------------------------------------------------------------------------------ No results for input(s): CHOL, HDL, LDLCALC, TRIG, CHOLHDL, LDLDIRECT in the last 72 hours.  Lab Results  Component Value Date   HGBA1C 17.2 (H) 01/28/2018   ------------------------------------------------------------------------------------------------------------------ No results for input(s): TSH, T4TOTAL, T3FREE, THYROIDAB in the last 72 hours.  Invalid input(s): FREET3 ------------------------------------------------------------------------------------------------------------------ No results for input(s): VITAMINB12, FOLATE, FERRITIN, TIBC, IRON, RETICCTPCT in the last 72 hours.  Coagulation profile No results for input(s): INR, PROTIME in the last 168 hours.  No results for input(s): DDIMER in the last 72 hours.  Cardiac Enzymes No results for input(s): CKMB, TROPONINI, MYOGLOBIN in the last 168 hours.  Invalid input(s): CK ------------------------------------------------------------------------------------------------------------------ No results found for: BNP  Inpatient Medications  Scheduled Meds: . apixaban  5 mg Oral BID  . aspirin EC  81 mg Oral BID  . atorvastatin  10 mg Oral q1800  . Chlorhexidine Gluconate Cloth  6 each Topical Q0600  . fenofibrate  160 mg Oral Daily  . insulin aspart  0-15 Units Subcutaneous TID WC  . insulin aspart  0-5 Units Subcutaneous QHS  . insulin aspart  3 Units Subcutaneous TID WC  . insulin  glargine  15 Units Subcutaneous QHS  . metoprolol tartrate  25 mg Oral BID  . mupirocin ointment  1 application Nasal BID  . omega-3 acid ethyl esters  2 g Oral BID  . pantoprazole  40 mg Oral Daily   Continuous Infusions: . sodium chloride 75 mL/hr at 01/29/18 1817  . sodium chloride    . ampicillin-sulbactam (UNASYN) IV Stopped (01/30/18 [id number])  . vancomycin 750 mg (01/30/18 1100)   PRN Meds:.acetaminophen **OR** acetaminophen, dextrose, metoprolol tartrate, oxyCODONE-acetaminophen, polyethylene glycol  Micro Results Recent Results (from the past 240 hour(s))  MRSA PCR Screening     Status: Abnormal   Collection Time: 01/28/18  4:50 PM  Result Value Ref Range Status   MRSA by PCR POSITIVE (A) NEGATIVE Final    Comment:        The GeneXpert MRSA Assay (FDA approved for NASAL specimens only), is one component of a comprehensive MRSA colonization surveillance program. It is not intended to diagnose MRSA infection nor to guide or monitor treatment for MRSA infections. RESULT CALLED TO, READ BACK BY AND VERIFIED WITH: E CHEEK RN 01/28/18 1856 JDW Performed at [facility], [address] [place], [place] [address]   Aerobic Culture (superficial specimen)     Status: None (Preliminary result)   Collection Time: 01/29/18  8:46 AM  Result Value Ref Range Status   Specimen Description ABSCESS  WOUND  Final   Special Requests Normal  Final   Gram Stain   Final    FEW WBC PRESENT, PREDOMINANTLY PMN FEW GRAM POSITIVE COCCI Performed at [facility], [address] [place], [place] [address]    Culture PENDING  Incomplete   Report Status PENDING  Incomplete    Radiology Reports No results found.   Huey Bienenstock M.D on 01/30/2018 at 1:30 PM  Between 7am to 7pm - Pager - [phone]  After 7pm go to [email or web address] - password [facility]  [facility] -  Office  [phone]

## 2018-01-31 ENCOUNTER — Inpatient Hospital Stay (HOSPITAL_COMMUNITY): Payer: Self-pay

## 2018-01-31 DIAGNOSIS — R51 Headache: Secondary | ICD-10-CM

## 2018-01-31 LAB — GLUCOSE, CAPILLARY
GLUCOSE-CAPILLARY: 219 mg/dL — AB (ref 70–99)
GLUCOSE-CAPILLARY: 205 mg/dL — AB (ref 70–99)
GLUCOSE-CAPILLARY: 174 mg/dL — AB (ref 70–99)
Glucose-Capillary: 183 mg/dL — ABNORMAL HIGH (ref 70–99)

## 2018-01-31 LAB — AEROBIC CULTURE  (SUPERFICIAL SPECIMEN): SPECIAL REQUESTS: NORMAL

## 2018-01-31 LAB — AEROBIC CULTURE W GRAM STAIN (SUPERFICIAL SPECIMEN)

## 2018-01-31 MED ORDER — SENNOSIDES-DOCUSATE SODIUM 8.6-50 MG PO TABS
2.0000 | ORAL_TABLET | Freq: Two times a day (BID) | ORAL | Status: DC
  Administered 2018-01-31 – 2018-02-01 (×2): 2 via ORAL
  Filled 2018-01-31 (×3): qty 2

## 2018-01-31 MED ORDER — INSULIN ASPART PROT & ASPART (70-30 MIX) 100 UNIT/ML ~~LOC~~ SUSP
14.0000 [IU] | Freq: Two times a day (BID) | SUBCUTANEOUS | Status: DC
  Administered 2018-01-31 – 2018-02-01 (×2): 14 [IU] via SUBCUTANEOUS

## 2018-01-31 MED ORDER — POLYETHYLENE GLYCOL 3350 17 G PO PACK
34.0000 g | PACK | Freq: Every day | ORAL | Status: DC
  Administered 2018-01-31 – 2018-02-01 (×2): 34 g via ORAL
  Filled 2018-01-31 (×3): qty 2

## 2018-01-31 NOTE — Progress Notes (Signed)
PROGRESS NOTE                                                                                                                                                                                                             Patient Demographics:    Emily Farmer, is a 62 y.o. female, DOB - 05-25-1956, ZOX:[id number]  Admit date - 01/28/2018   Admitting Physician Marinda Elk, MD  Outpatient Primary MD for the patient is No primary care provider on file.  LOS - 3   Chief Complaint  Patient presents with  . Hyperglycemia       Brief Narrative    62 y.o. female with medical history significant of past medical history of chronic diastolic heart failure, diabetes mellitus paroxysmal atrial fibrillation on Eliquis who came into the ED as she started feeling lightheaded upon standing that started on the day of admission, work-up significant for hyperosmolar hyperglycemic nonketotic state and mons pubis abscess   Subjective:    Emily Farmer today has, she reports some headache this morning,, No chest pain, No abdominal pain - No Nausea,    Assessment  & Plan :    Active Problems:   Hyperglycemia   Essential hypertension   Diabetes mellitus type 2 in obese (HCC)   Paroxysmal A-fib (HCC)   Hyperglycemic hyperosmolar nonketotic coma (HCC)   Atrial fibrillation with RVR (HCC)   Non-compliance   Hyponatremia   Normocytic anemia   Chronic diastolic CHF (congestive heart failure) (HCC)   Labial abscess   Orthostatic hypotension   Hyperosmolar non-ketotic state in patient with type 2 diabetes mellitus (HCC)   Hyperglycemic hyperosmolar hyperglycemia (HCC) -This is most likely in the setting of noncompliance with her medication and section -No evidence of DKA on admission, anion gap within normal limits, she was started on insulin drip, started on Lantus 15 units nightly, diabetic coordinator input greatly appreciated, has been changed to Novolin  70/30 at it is more affordable and ensure compliance as an outpatient.  Her numbers are uncontrolled today, so Novolin 70/30 was increased from 12-14 twice daily -A1c significantly elevated at 17.2  A. fib with RVR/paroxysmal atrial fibrillation: -Patient with paroxysmal A. fib during hospital stay, and out of A. fib, and with metoprolol for heart rate control, continue with Eliquis, but will hold this morning dose pending surgery consult.  Monus pubis abscesses: -This is most likely due to poorly controlled diabetes mellitus, status post bedside incision incision and drainage by ED physician seen and examined today with the wound care nurse, still able to express some purulent discharge, fear of these abscesses may be connected, neurosurgery consult appreciated, no indication for surgery, continue with wound dressing changes with packing 2 times daily -Initially on Unasyn and vancomycin, she is MRSA positive, Unasyn has been stopped, will switch to oral Bactrim tomorrow.  Headache -Complain of headache today, given the fact she is on Eliquis, had fall with head laceration, I have obtain CTA to rule out bleed, CT head with no acute intracranial bleed.  Orthostatic hypotension/essential hypertension: -Volume depletion and dehydration, improved with IV fluids,continue  with metoprolol, hold other meds  Normocytic anemia: Previous hemoglobin was 9.7 hemoglobin seems to be close to baseline she denies any melanotic stools.  Chronic diastolic CHF (congestive heart failure) (HCC): -Actually she appears to be dry, continue to hold diuresis   Hypomagnesemia -Repleted  Code Status : Full  Family Communication  : None at bedside  Disposition Plan  : Home once stable  Consults  : General surgery  Procedures  : I&D by 8 physician of local abscess and mons pubis  DVT Prophylaxis  : On Eliquis  Lab Results  Component Value Date   PLT 211 01/30/2018    Antibiotics  :     Anti-infectives (From admission, onward)   Start     Dose/Rate Route Frequency Ordered Stop   01/30/18 2300  vancomycin (VANCOCIN) IVPB 750 mg/150 ml premix     750 mg 150 mL/hr over 60 Minutes Intravenous Every 12 hours 01/30/18 1356     01/29/18 2300  vancomycin (VANCOCIN) IVPB 750 mg/150 ml premix  Status:  Discontinued     750 mg 150 mL/hr over 60 Minutes Intravenous Every 12 hours 01/29/18 1006 01/30/18 1346   01/29/18 1000  vancomycin (VANCOCIN) 1,250 mg in sodium chloride 0.9 % 250 mL IVPB     1,250 mg 166.7 mL/hr over 90 Minutes Intravenous  Once 01/29/18 0948 01/29/18 1330   01/29/18 1000  piperacillin-tazobactam (ZOSYN) IVPB 3.375 g  Status:  Discontinued     3.375 g 12.5 mL/hr over 240 Minutes Intravenous Every 8 hours 01/29/18 0948 01/29/18 0958   01/29/18 1000  ampicillin-sulbactam (UNASYN) 1.5 g in sodium chloride 0.9 % 100 mL IVPB  Status:  Discontinued     1.5 g 200 mL/hr over 30 Minutes Intravenous Every 6 hours 01/29/18 0958 01/30/18 1352   01/28/18 2200  doxycycline (VIBRA-TABS) tablet 100 mg  Status:  Discontinued     100 mg Oral Every 12 hours 01/28/18 1653 01/29/18 0929        Objective:   Vitals:   01/30/18 0526 01/30/18 1326 01/30/18 2125 01/31/18 0506  BP: (!) 115/54 127/69 108/66 (!) 111/57  Pulse: 68 68 75 66  Resp: 17 17 18 18   Temp:  98.2 F (36.8 C) 98.7 F (37.1 C) 98.3 F (36.8 C)  TempSrc:  Oral Oral Oral  SpO2: 95% 97% 94% 97%  Weight:      Height:        Wt Readings from Last 3 Encounters:  01/29/18 65.7 kg  12/31/17 72.6 kg  06/01/17 66.6 kg     Intake/Output Summary (Last 24 hours) at 01/31/2018 1429 Last data filed at 01/31/2018 1418 Gross per 24 hour  Intake 2117 ml  Output -  Net 2117 ml  Physical Exam   Awake Alert, Oriented X 3, No new F.N deficits, Normal affect Symmetrical Chest wall movement, Good air movement bilaterally, CTAB RRR,No Gallops,Rubs or new Murmurs, No Parasternal Heave +ve B.Sounds, Abd Soft,  No tenderness,   abscess areas on mons pubis, with some packing, is able to express some minimal amount of purulent drainage. No Cyanosis, Clubbing or edema, No new Rash or bruise      Data Review:    CBC Recent Labs  Lab 01/28/18 0821 01/29/18 0519 01/30/18 0339  WBC 10.5 12.0* 9.1  HGB 11.2* 9.6* 9.0*  HCT 33.7* 29.5* 27.6*  PLT 207 205 211  MCV 84.3 85.3 86.3  MCH 28.0 27.7 28.1  MCHC 33.2 32.5 32.6  RDW 11.9 12.2 12.3    Chemistries  Recent Labs  Lab 01/28/18 0821 01/28/18 1715 01/29/18 0519 01/30/18 0339  NA 130*  --  134* 135  K 4.4  --  3.8 3.5  CL 95*  --  105 103  CO2 23  --  23 24  GLUCOSE 563*  --  231* 213*  BUN 22*  --  9 6  CREATININE 0.79  --  0.49 0.57  CALCIUM 9.0  --  8.0* 8.1*  MG  --  1.4*  --   --    ------------------------------------------------------------------------------------------------------------------ No results for input(s): CHOL, HDL, LDLCALC, TRIG, CHOLHDL, LDLDIRECT in the last 72 hours.  Lab Results  Component Value Date   HGBA1C 17.2 (H) 01/28/2018   ------------------------------------------------------------------------------------------------------------------ No results for input(s): TSH, T4TOTAL, T3FREE, THYROIDAB in the last 72 hours.  Invalid input(s): FREET3 ------------------------------------------------------------------------------------------------------------------ No results for input(s): VITAMINB12, FOLATE, FERRITIN, TIBC, IRON, RETICCTPCT in the last 72 hours.  Coagulation profile No results for input(s): INR, PROTIME in the last 168 hours.  No results for input(s): DDIMER in the last 72 hours.  Cardiac Enzymes No results for input(s): CKMB, TROPONINI, MYOGLOBIN in the last 168 hours.  Invalid input(s): CK ------------------------------------------------------------------------------------------------------------------ No results found for: BNP  Inpatient Medications  Scheduled Meds: .  apixaban  5 mg Oral BID  . aspirin EC  81 mg Oral BID  . atorvastatin  10 mg Oral q1800  . Chlorhexidine Gluconate Cloth  6 each Topical Q0600  . fenofibrate  160 mg Oral Daily  . insulin aspart  0-15 Units Subcutaneous TID WC  . insulin aspart  0-5 Units Subcutaneous QHS  . insulin aspart protamine- aspart  14 Units Subcutaneous BID WC  . metoprolol tartrate  25 mg Oral BID  . mupirocin ointment  1 application Nasal BID  . omega-3 acid ethyl esters  2 g Oral BID  . pantoprazole  40 mg Oral Daily  . polyethylene glycol  34 g Oral Daily  . senna-docusate  2 tablet Oral BID   Continuous Infusions: . sodium chloride Stopped (01/31/18 1000)  . sodium chloride    . vancomycin Stopped (01/31/18 1418)   PRN Meds:.acetaminophen **OR** acetaminophen, dextrose, metoprolol tartrate, oxyCODONE-acetaminophen, polyethylene glycol  Micro Results Recent Results (from the past 240 hour(s))  MRSA PCR Screening     Status: Abnormal   Collection Time: 01/28/18  4:50 PM  Result Value Ref Range Status   MRSA by PCR POSITIVE (A) NEGATIVE Final    Comment:        The GeneXpert MRSA Assay (FDA approved for NASAL specimens only), is one component of a comprehensive MRSA colonization surveillance program. It is not intended to diagnose MRSA infection nor to guide or monitor treatment for MRSA infections. RESULT  CALLED TO, READ BACK BY AND VERIFIED WITH: E CHEEK RN 01/28/18 1856 JDW Performed at [facility], [address] [place], [place] [address]   Aerobic Culture (superficial specimen)     Status: None   Collection Time: 01/29/18  8:46 AM  Result Value Ref Range Status   Specimen Description ABSCESS WOUND  Final   Special Requests Normal  Final   Gram Stain   Final    FEW WBC PRESENT, PREDOMINANTLY PMN FEW GRAM POSITIVE COCCI Performed at [facility], [address] [place], [place] [address]    Culture FEW METHICILLIN RESISTANT STAPHYLOCOCCUS AUREUS  Final   Report  Status 01/31/2018 FINAL  Final   Organism ID, Bacteria METHICILLIN RESISTANT STAPHYLOCOCCUS AUREUS  Final      Susceptibility   Methicillin resistant staphylococcus aureus - MIC*    CIPROFLOXACIN >=8 RESISTANT Resistant     ERYTHROMYCIN >=8 RESISTANT Resistant     GENTAMICIN <=0.5 SENSITIVE Sensitive     OXACILLIN >=4 RESISTANT Resistant     TETRACYCLINE >=16 RESISTANT Resistant     VANCOMYCIN <=0.5 SENSITIVE Sensitive     TRIMETH/SULFA <=10 SENSITIVE Sensitive     CLINDAMYCIN <=0.25 SENSITIVE Sensitive     RIFAMPIN <=0.5 SENSITIVE Sensitive     Inducible Clindamycin NEGATIVE Sensitive     * FEW METHICILLIN RESISTANT STAPHYLOCOCCUS AUREUS    Radiology Reports Ct Head Wo Contrast  Result Date: 01/31/2018 CLINICAL DATA:  Fall 3 weeks ago.  Headache. EXAM: CT HEAD WITHOUT CONTRAST TECHNIQUE: Contiguous axial images were obtained from the base of the skull through the vertex without intravenous contrast. COMPARISON:  CT head 12/13/2015 FINDINGS: Brain: Ventricle size normal. Mild cerebral atrophy. Negative for acute infarct, hemorrhage, mass Vascular: Negative for hyperdense vessel Skull: Negative for skull fracture. Left convexity scalp contusion and laceration Sinuses/Orbits: Negative Other: None IMPRESSION: No acute intracranial  abnormality Left convexity scalp contusion and laceration. Electronically Signed   By: Marlan Palau M.D.   On: 01/31/2018 10:30     Huey Bienenstock M.D on 01/31/2018 at 2:29 PM  Between 7am to 7pm - Pager - [phone]  After 7pm go to [email or web address] - password [facility]  [facility] -  Office  [phone]

## 2018-01-31 NOTE — Care Management (Signed)
Plans are for patient to d/c home tomorrow.  Pt is uninsured and [facility] wound care/dressing changes of mons pubis wound.  Pt lives with her sister, who is able to assist to a point but will not change dressings per patient.  Jermaine with [facility] to start charity care application.

## 2018-02-01 ENCOUNTER — Telehealth: Payer: Self-pay

## 2018-02-01 DIAGNOSIS — E669 Obesity, unspecified: Secondary | ICD-10-CM

## 2018-02-01 DIAGNOSIS — I4891 Unspecified atrial fibrillation: Secondary | ICD-10-CM

## 2018-02-01 DIAGNOSIS — N764 Abscess of vulva: Secondary | ICD-10-CM

## 2018-02-01 DIAGNOSIS — E1169 Type 2 diabetes mellitus with other specified complication: Secondary | ICD-10-CM

## 2018-02-01 DIAGNOSIS — I5032 Chronic diastolic (congestive) heart failure: Secondary | ICD-10-CM

## 2018-02-01 LAB — GLUCOSE, CAPILLARY
GLUCOSE-CAPILLARY: 216 mg/dL — AB (ref 70–99)
GLUCOSE-CAPILLARY: 246 mg/dL — AB (ref 70–99)

## 2018-02-01 MED ORDER — SULFAMETHOXAZOLE-TRIMETHOPRIM 800-160 MG PO TABS: 1.0000 | ORAL_TABLET | Freq: Two times a day (BID) | ORAL | 0 refills | Status: DC

## 2018-02-01 MED ORDER — ASPIRIN EC 81 MG PO TBEC: 81.0000 mg | DELAYED_RELEASE_TABLET | Freq: Every day | ORAL | 0 refills | Status: DC

## 2018-02-01 MED ORDER — FENOFIBRATE 160 MG PO TABS: 160.0000 mg | ORAL_TABLET | Freq: Every day | ORAL | 0 refills | Status: DC

## 2018-02-01 MED ORDER — PANTOPRAZOLE SODIUM 40 MG PO TBEC: 40.0000 mg | DELAYED_RELEASE_TABLET | Freq: Every day | ORAL | 0 refills | Status: DC

## 2018-02-01 MED ORDER — APIXABAN 5 MG PO TABS: 5.0000 mg | ORAL_TABLET | Freq: Two times a day (BID) | ORAL | 0 refills | Status: DC

## 2018-02-01 MED ORDER — "INSULIN SYRINGE 27G X 1/2"" 0.5 ML MISC": 1.0000 "application " | Freq: Two times a day (BID) | 2 refills | Status: DC

## 2018-02-01 MED ORDER — ATORVASTATIN CALCIUM 10 MG PO TABS: 10.0000 mg | ORAL_TABLET | Freq: Every day | ORAL | 0 refills | Status: DC

## 2018-02-01 MED ORDER — INSULIN ASPART PROT & ASPART (70-30 MIX) 100 UNIT/ML ~~LOC~~ SUSP: 14.0000 [IU] | Freq: Two times a day (BID) | SUBCUTANEOUS | 3 refills | Status: DC

## 2018-02-01 MED ORDER — METFORMIN HCL 500 MG PO TABS: 500.0000 mg | ORAL_TABLET | Freq: Two times a day (BID) | ORAL | 0 refills | Status: DC

## 2018-02-01 MED ORDER — ACETAMINOPHEN 325 MG PO TABS: 650.0000 mg | ORAL_TABLET | Freq: Four times a day (QID) | ORAL | Status: AC | PRN

## 2018-02-01 MED ORDER — OMEGA-3-ACID ETHYL ESTERS 1 G PO CAPS: 2.0000 g | ORAL_CAPSULE | Freq: Two times a day (BID) | ORAL | 0 refills | Status: DC

## 2018-02-01 MED ORDER — MUPIROCIN 2 % EX OINT: 1.0000 "application " | TOPICAL_OINTMENT | Freq: Two times a day (BID) | CUTANEOUS | 0 refills | Status: DC

## 2018-02-01 MED ORDER — METOPROLOL TARTRATE 25 MG PO TABS: 25.0000 mg | ORAL_TABLET | Freq: Two times a day (BID) | ORAL | 0 refills | Status: DC

## 2018-02-01 MED FILL — !ELIQUIS 5MG TABLET: 5 | 30 days supply | Qty: 60 | Fill #0

## 2018-02-01 MED FILL — MUPIROCIN 2% OINTMENT: 2 | 10 days supply | Qty: 22 | Fill #0

## 2018-02-01 MED FILL — PANTOPRAZOLE SOD DR 40 MG T: 40 | 30 days supply | Qty: 30 | Fill #0

## 2018-02-01 MED FILL — METOPROLOL TARTRATE 25 MG T: 25 | 30 days supply | Qty: 60 | Fill #0

## 2018-02-01 MED FILL — FENOFIBRATE 160 MG TABLET: 160 | 30 days supply | Qty: 30 | Fill #0

## 2018-02-01 MED FILL — SULFAMETHOXAZOLE-TMP DS TAB: 800-160 | 10 days supply | Qty: 20 | Fill #0

## 2018-02-01 MED FILL — ATORVASTATIN 10 MG TABLET: 10 | 30 days supply | Qty: 30 | Fill #0

## 2018-02-01 MED FILL — TRUEPLUS SYR 0.3ML 30GX5/16: 30G X 5/16" | 50 days supply | Qty: 100 | Fill #0

## 2018-02-01 MED FILL — NOVOLOG MIX 70/30 VIAL: (70-30) 100 | 42 days supply | Qty: 10 | Fill #0

## 2018-02-01 MED FILL — metFORMIN HCL 500 MG TABS: 500 | 30 days supply | Qty: 60 | Fill #0

## 2018-02-01 NOTE — Telephone Encounter (Signed)
Message received from Laurena Slimmer, RN CM requesting a hospital follow up appointment for the patient at [facility].  This CM met with the patient at the hospital this afternoon. She said that she lives with her sister and until recently her 61 year old son who is autistic.  She said that she had to place him in a group home.  She explained that she is currently working at [employer] on [employer] in [place] area.   She said that she lost her insurance when her son turned 67 years old. This CM explained to her about the services offered at [facility] including pharmacy assistance, financial counseling, social work in addition to primary care.  She said that she usually uses public transportation. She has tried [facility] but had to take her son on that with her and he was very uncomfortable on the bus.   She explained that she has a One Touch Ultra at home  She has been given wound care supplies and she stated that she is able to do the wound care when she is in the bathroom and has the mirror to use to help her visualize the wounds. Home health is being ordered through [facility].   Instructed her to pick up her medications today at [facility].  As per Collier Flowers, [place], the patient will be given a free fill today of all of her medications.   The hospital SW provided the patient with a bus pass to get home today.  The patient said that the best phone # to reach her is her sister, Everlene Farrier # [phone].   Addendum- the patient came to [facility] after discharge to pick up her medications and was given a bus pass to return to the clinic on 02/04/18 for her scheduled appointment.

## 2018-02-01 NOTE — Progress Notes (Signed)
Pharmacy Antibiotic Note  Emily Farmer is a 62 y.o. female admitted on 01/28/2018 with lightheadedness upon standing.  Found to have labial abscess requiring I&D.  Pharmacy has been consulted for vancomycin dosing.  SCr 0.57, CrCL 68 ml/min, afebrile, WBC down to normal.  Plan: Vancomycin 750 mg IV Q12H Monitor renal fxn, clinical progress, vanc trough as indicated BMET in am Plan is to discharge soon on Bactrim so will hold of on trough for now   Height: 5\' 3"  (160 cm) Weight: 148 lb 3.2 oz (67.2 kg) IBW/kg (Calculated) : 52.4  Temp (24hrs), Avg:98.3 F (36.8 C), Min:98.1 F (36.7 C), Max:98.5 F (36.9 C)  Recent Labs  Lab 01/28/18 0821 01/29/18 0519 01/30/18 0339  WBC 10.5 12.0* 9.1  CREATININE 0.79 0.49 0.57    Estimated Creatinine Clearance: 68.8 mL/min (by C-G formula based on SCr of 0.57 mg/dL).    No Known Allergies   Doxy 8/15 >> 8/16 Unasyn 8/16 >> Vanc 8/16 >>  8/15 HIV - negative 8/15 MRSA PCR - positive 8/16 abscess cx - few MRSA (S-Bactrim)   Emily Farmer, PharmD, BCPS Clinical Pharmacist Clinical phone for 02/01/2018 until 3p is [phone] Please check AMION for all Pharmacist numbers by unit 02/01/2018 11:29 AM

## 2018-02-01 NOTE — Progress Notes (Signed)
Pubic dressing done on 2 pubic abscess. Wound clean and pack with gauze.

## 2018-02-01 NOTE — Progress Notes (Signed)
Emily Farmer discharged Home per MD order.  Discharge instructions reviewed and discussed with the patient, all questions and concerns answered. Copy of instructions, care notes for new diagnosis/medications and scripts given to patient.  Pt. Requested note for work.  Will contact MD for.  MD completed the note and printed and given to pt.  Allergies as of 02/01/2018   No Known Allergies     Medication List    STOP taking these medications   insulin glargine 100 UNIT/ML injection Commonly known as:  LANTUS     TAKE these medications   acetaminophen 325 MG tablet Commonly known as:  TYLENOL Take 2 tablets (650 mg total) by mouth every 6 (six) hours as needed for mild pain (or Fever >/= 101).   apixaban 5 MG Tabs tablet Commonly known as:  ELIQUIS Take 1 tablet (5 mg total) by mouth 2 (two) times daily.   aspirin EC 81 MG tablet Take 1 tablet (81 mg total) by mouth daily. What changed:  when to take this   atorvastatin 10 MG tablet Commonly known as:  LIPITOR Take 1 tablet (10 mg total) by mouth daily.   CENTRUM SILVER PO Take 1 tablet by mouth daily.   fenofibrate 160 MG tablet Take 1 tablet (160 mg total) by mouth daily.   insulin aspart protamine- aspart (70-30) 100 UNIT/ML injection Commonly known as:  NOVOLOG MIX 70/30 Inject 0.14 mLs (14 Units total) into the skin 2 (two) times daily with a meal.   Insulin Syringe 27G X 1/2" 0.5 ML Misc 1 application by Does not apply route 2 (two) times daily.   metFORMIN 500 MG tablet Commonly known as:  GLUCOPHAGE Take 1 tablet (500 mg total) by mouth 2 (two) times daily with a meal.   metoprolol tartrate 25 MG tablet Commonly known as:  LOPRESSOR Take 1 tablet (25 mg total) by mouth 2 (two) times daily.   mupirocin ointment 2 % Commonly known as:  BACTROBAN Place 1 application into the nose 2 (two) times daily.   omega-3 acid ethyl esters 1 g capsule Commonly known as:  LOVAZA Take 2 capsules (2 g total) by mouth 2  (two) times daily. What changed:  how much to take   pantoprazole 40 MG tablet Commonly known as:  PROTONIX Take 1 tablet (40 mg total) by mouth daily.   sulfamethoxazole-trimethoprim 800-160 MG tablet Commonly known as:  BACTRIM DS,SEPTRA DS Take 1 tablet by mouth 2 (two) times daily.            Discharge Care Instructions  (From admission, onward)         Start     Ordered   02/01/18 0000  Discharge wound care:    Comments:  Warm soaks on abscesses of groin TID for 20-30 mintues, change iodoform packing twice daily   02/01/18 1119          IV site discontinued and catheter remains intact. Site without signs and symptoms of complications. Dressing and pressure applied.  Patient escorted to car by NT in a wheelchair,  no distress noted upon discharge.  Emily Farmer C 02/01/2018 1:08 PM

## 2018-02-01 NOTE — Discharge Summary (Signed)
Emily Farmer, is a 62 y.o. female  DOB December 17, 1955  MRN [id number].  Admission date:  01/28/2018  Admitting Physician  Marinda Elk, MD  Discharge Date:  02/01/2018   Primary MD  No primary care provider on file.  Recommendations for primary care physician for things to follow:  -Please check CBC, BMP during next visit -Please continue with insulin regimen adjustment for optimal CBG control   Admission Diagnosis  Orthostatic hypotension [I95.1] Hyperglycemia [R73.9] Paroxysmal A-fib (HCC) [I48.0] Hyperglycemic hyperosmolar nonketotic coma (HCC) [E11.01]   Discharge Diagnosis  Orthostatic hypotension [I95.1] Hyperglycemia [R73.9] Paroxysmal A-fib (HCC) [I48.0] Hyperglycemic hyperosmolar nonketotic coma (HCC) [E11.01]    Active Problems:   Hyperglycemia   Essential hypertension   Diabetes mellitus type 2 in obese (HCC)   Paroxysmal A-fib (HCC)   Hyperglycemic hyperosmolar nonketotic coma (HCC)   Atrial fibrillation with RVR (HCC)   Non-compliance   Hyponatremia   Normocytic anemia   Chronic diastolic CHF (congestive heart failure) (HCC)   Labial abscess   Orthostatic hypotension   Hyperosmolar non-ketotic state in patient with type 2 diabetes mellitus (HCC)      Past Medical History:  Diagnosis Date  . Arthritis   . Atrial fibrillation (HCC)    a. 09/2016 in setting of pancreatitis;  b. 09/2016 Echo: EF 65-60%, no rwma, Gr2 DD, mild MR, triv TR, PASP ;  CHA2DS2VASc = 4-->Eliquis 5mg  BID.  Marland Kitchen Chronic diastolic CHF (congestive heart failure) (HCC) 01/28/2018  . Diabetes mellitus   . Gout   . Hyperlipidemia   . Hypertension   . Hypertriglyceridemia   . Pancreatitis    a. 09/2016 - Triglycerides 1,392 on admission.    Past Surgical History:  Procedure Laterality Date  . CHOLECYSTECTOMY N/A 01/04/2014   Procedure: LAPAROSCOPIC CHOLECYSTECTOMY WITH INTRAOPERATIVE  CHOLANGIOGRAM;  Surgeon: Axel Filler, MD;  Location: [facility] OR;  Service: General;  Laterality: N/A;  . LUNG SURGERY         History of present illness and  Hospital Course:     Kindly see H&P for history of present illness and admission details, please review complete Labs, Consult reports and Test reports for all details in brief  HPI  from the history and physical done on the day of admission 01/28/2018  HPI: Emily Farmer is a 62 y.o. female with medical history significant of past medical history of chronic diastolic heart failure, diabetes mellitus paroxysmal atrial fibrillation on Eliquis who came into the ED as she started feeling lightheaded upon standing that started on the day of admission, she relates she has had a dip centimeters polyuria, due to insurances problem she has not been able to afford her insulin.  She is also been complaining of high blood glucose at home and 3 boils around her genital area.,  She relates she is getting her metformin home per primary care doctor.  ED Course:  In the ED she was found to be afebrile with no leukocytosis or left shift, with a blood glucose of 600 she was given  a liter of normal saline and started her on IV fluids, during her admission in the ED she was found to be in A. fib with RVR but after fluid resuscitation her A. fib resolved she was not tolerant started on rate controlling medication.  Hospital Course  62 y.o.femalewith medical history significant ofpast medical history of chronic diastolic heart failure, diabetes mellitus paroxysmal atrial fibrillation on Eliquis who came into the ED as she started feeling lightheaded upon standing that started on the day of admission, work-up significant for hyperosmolar hyperglycemic nonketotic state and mons pubis abscess    Hyperglycemic hyperosmolar hyperglycemia (HCC) -This is most likely in the setting of noncompliance with her medication  -No evidence of DKA on admission, anion gap  within normal limits, she was started on insulin drip, started on Lantus 15 units nightly, diabetic coordinator input greatly appreciated, has been changed to Novolin 70/30 at it is more affordable and ensure compliance as an outpatient.   She will be discharged on Novolin 70/30 ,14 units twice daily, and metformin -A1c significantly elevated at 17.2  A. fib with RVR/paroxysmal atrial fibrillation: -Patient with paroxysmal A. fib during hospital stay, and out of A. fib, and with metoprolol for heart rate control, continue with Eliquis,   Monus pubis abscesses: -This is most likely due to poorly controlled diabetes mellitus, status post bedside incision incision and drainage by ED physician seen and examined today with the wound care nurse, was seen by general surgery, no indication for further debridement, continue with wound dressing changes with packing 2 times daily -Initially on Unasyn and vancomycin, she is MRSA positive, she will be discharged on another 10 days of oral Bactrim DSHeadache -Complain of headache today, given the fact she is on Eliquis, had fall with head laceration, I have obtain CTA to rule out bleed, CT head with no acute intracranial bleed.  Orthostatic hypotension/essential hypertension: -Volume depletion and dehydration, improved with IV fluids,continue    Normocytic anemia: Previous hemoglobin was 9.7 hemoglobin seems to be close to baseline she denies any melanotic stools.  Chronic diastolic CHF (congestive heart failure) (HCC): -Appears to be euvolemic at time of discharge  Hypomagnesemia -Repleted  Discharge Condition:  stable  Patient with no insurance, case management has been consulted,  Follow UP  Follow-up Information    [facility] [place] Call.   Specialty:  General Surgery Why:  We are working on your appointment, please call to confirm Follow up in about 1 week after discharge  Contact information: [address] [place] [id number] [phone]       [facility] Follow up.   Why:  Patient instructed to contact clinic upon discharge for hospital follow up  appointment. ED CM has also sent in-basket information to reach out  to patient schedule follow up appointment. Contact information: [address] [place] [phone]            Discharge Instructions  and  Discharge Medications     Discharge Instructions    Amb referral to AFIB Clinic   Complete by:  As directed    Ambulatory referral to Nutrition and Diabetic Education   Complete by:  As directed    Discharge instructions   Complete by:  As directed    Follow with Primary MDin 7 days   Get CBC, CMP,  checked  by Primary MD next visit.    Activity: As tolerated with Full fall  precautions use walker/cane & assistance as needed   Disposition Home    Diet: Heart Healthy, carbohydrate modified, with feeding assistance and aspiration precautions.  For Heart failure patients - Check your Weight same time everyday, if you gain over 2 pounds, or you develop in leg swelling, experience more shortness of breath or chest pain, call your Primary MD immediately. Follow Cardiac Low Salt Diet and 1.5 lit/day fluid restriction.   On your next visit with your primary care physician please Get Medicines reviewed and adjusted.   Please request your Prim.MD to go over all Hospital Tests and Procedure/Radiological results at the follow up, please get all Hospital records sent to your Prim MD by signing hospital release before you go home.   If you experience worsening of your admission symptoms, develop shortness of breath, life threatening emergency, suicidal or homicidal thoughts you must seek medical attention immediately by calling 911 or calling your MD immediately  if symptoms less severe.  You Must read complete instructions/literature  along with all the possible adverse reactions/side effects for all the Medicines you take and that have been prescribed to you. Take any new Medicines after you have completely understood and accpet all the possible adverse reactions/side effects.   Do not drive, operating heavy machinery, perform activities at heights, swimming or participation in water activities or provide baby sitting services if your were admitted for syncope or siezures until you have seen by Primary MD or a Neurologist and advised to do so again.  Do not drive when taking Pain medications.    Do not take more than prescribed Pain, Sleep and Anxiety Medications  Special Instructions: If you have smoked or chewed Tobacco  in the last 2 yrs please stop smoking, stop any regular Alcohol  and or any Recreational drug use.  Wear Seat belts while driving.   Please note  You were cared for by a hospitalist during your hospital stay. If you have any questions about your discharge medications or the care you received while you were in the hospital after you are discharged, you can call the unit and asked to speak with the hospitalist on call if the hospitalist that took care of you is not available. Once you are discharged, your primary care physician will handle any further medical issues. Please note that NO REFILLS for any discharge medications will be authorized once you are discharged, as it is imperative that you return to your primary care physician (or establish a relationship with a primary care physician if you do not have one) for your aftercare needs so that they can reassess your need for medications and monitor your lab values.   Discharge wound care:   Complete by:  As directed    Warm soaks on abscesses of groin TID for 20-30 mintues, change iodoform packing twice daily   Increase activity slowly   Complete by:  As directed      Allergies as of 02/01/2018   No Known Allergies     Medication List    STOP  taking these medications   insulin glargine 100 UNIT/ML injection Commonly known as:  LANTUS     TAKE these medications   acetaminophen 325 MG tablet Commonly known as:  TYLENOL Take 2 tablets (650 mg total) by mouth every 6 (six) hours as needed for mild pain (or Fever >/= 101).   apixaban 5 MG Tabs tablet Commonly known as:  ELIQUIS Take 1 tablet (5 mg total) by mouth 2 (two) times  daily.   aspirin EC 81 MG tablet Take 1 tablet (81 mg total) by mouth daily. What changed:  when to take this   atorvastatin 10 MG tablet Commonly known as:  LIPITOR Take 1 tablet (10 mg total) by mouth daily.   CENTRUM SILVER PO Take 1 tablet by mouth daily.   fenofibrate 160 MG tablet Take 1 tablet (160 mg total) by mouth daily.   insulin aspart protamine- aspart (70-30) 100 UNIT/ML injection Commonly known as:  NOVOLOG MIX 70/30 Inject 0.14 mLs (14 Units total) into the skin 2 (two) times daily with a meal.   Insulin Syringe 27G X 1/2" 0.5 ML Misc 1 application by Does not apply route 2 (two) times daily.   metFORMIN 500 MG tablet Commonly known as:  GLUCOPHAGE Take 1 tablet (500 mg total) by mouth 2 (two) times daily with a meal.   metoprolol tartrate 25 MG tablet Commonly known as:  LOPRESSOR Take 1 tablet (25 mg total) by mouth 2 (two) times daily.   mupirocin ointment 2 % Commonly known as:  BACTROBAN Place 1 application into the nose 2 (two) times daily.   omega-3 acid ethyl esters 1 g capsule Commonly known as:  LOVAZA Take 2 capsules (2 g total) by mouth 2 (two) times daily. What changed:  how much to take   pantoprazole 40 MG tablet Commonly known as:  PROTONIX Take 1 tablet (40 mg total) by mouth daily.   sulfamethoxazole-trimethoprim 800-160 MG tablet Commonly known as:  BACTRIM DS,SEPTRA DS Take 1 tablet by mouth 2 (two) times daily.            Discharge Care Instructions  (From admission, onward)         Start     Ordered   02/01/18 0000  Discharge  wound care:    Comments:  Warm soaks on abscesses of groin TID for 20-30 mintues, change iodoform packing twice daily   02/01/18 1119            Diet and Activity recommendation: See Discharge Instructions above   Consults obtained -  General surgery   Major procedures and Radiology Reports - PLEASE review detailed and final reports for all details, in brief -    Ct Head Wo Contrast  Result Date: 01/31/2018 CLINICAL DATA:  Fall 3 weeks ago.  Headache. EXAM: CT HEAD WITHOUT CONTRAST TECHNIQUE: Contiguous axial images were obtained from the base of the skull through the vertex without intravenous contrast. COMPARISON:  CT head 12/13/2015 FINDINGS: Brain: Ventricle size normal. Mild cerebral atrophy. Negative for acute infarct, hemorrhage, mass Vascular: Negative for hyperdense vessel Skull: Negative for skull fracture. Left convexity scalp contusion and laceration Sinuses/Orbits: Negative Other: None IMPRESSION: No acute intracranial  abnormality Left convexity scalp contusion and laceration. Electronically Signed   By: Marlan Palau M.D.   On: 01/31/2018 10:30    Micro Results   Recent Results (from the past 240 hour(s))  MRSA PCR Screening     Status: Abnormal   Collection Time: 01/28/18  4:50 PM  Result Value Ref Range Status   MRSA by PCR POSITIVE (A) NEGATIVE Final    Comment:        The GeneXpert MRSA Assay (FDA approved for NASAL specimens only), is one component of a comprehensive MRSA colonization surveillance program. It is not intended to diagnose MRSA infection nor to guide or monitor treatment for MRSA infections. RESULT CALLED TO, READ BACK BY AND VERIFIED WITH: E CHEEK RN 01/28/18 1856 JDW  Performed at [facility], [address] [place], [place] [address]   Aerobic Culture (superficial specimen)     Status: None   Collection Time: 01/29/18  8:46 AM  Result Value Ref Range Status   Specimen Description ABSCESS WOUND  Final   Special Requests  Normal  Final   Gram Stain   Final    FEW WBC PRESENT, PREDOMINANTLY PMN FEW GRAM POSITIVE COCCI Performed at [facility], [address], [place], [place] [address]    Culture FEW METHICILLIN RESISTANT STAPHYLOCOCCUS AUREUS  Final   Report Status 01/31/2018 FINAL  Final   Organism ID, Bacteria METHICILLIN RESISTANT STAPHYLOCOCCUS AUREUS  Final      Susceptibility   Methicillin resistant staphylococcus aureus - MIC*    CIPROFLOXACIN >=8 RESISTANT Resistant     ERYTHROMYCIN >=8 RESISTANT Resistant     GENTAMICIN <=0.5 SENSITIVE Sensitive     OXACILLIN >=4 RESISTANT Resistant     TETRACYCLINE >=16 RESISTANT Resistant     VANCOMYCIN <=0.5 SENSITIVE Sensitive     TRIMETH/SULFA <=10 SENSITIVE Sensitive     CLINDAMYCIN <=0.25 SENSITIVE Sensitive     RIFAMPIN <=0.5 SENSITIVE Sensitive     Inducible Clindamycin NEGATIVE Sensitive     * FEW METHICILLIN RESISTANT STAPHYLOCOCCUS AUREUS       Today   Subjective:   Lajean Boese today has no headache,no chest abdominal pain,no new weakness tingling or numbness, feels much better  today.   Objective:   Blood pressure 137/72, pulse 64, temperature 98.1 F (36.7 C), temperature source Oral, resp. rate 18, height 5\' 3"  (1.6 m), weight 67.2 kg, SpO2 93 %.   Intake/Output Summary (Last 24 hours) at 02/01/2018 1120 Last data filed at 02/01/2018 1050 Gross per 24 hour  Intake 855 ml  Output -  Net 855 ml    Exam Awake Alert, Oriented x 3, No new F.N deficits, Normal affect Symmetrical Chest wall movement, Good air movement bilaterally, CTAB RRR,No Gallops,Rubs or new Murmurs, No Parasternal Heave +ve B.Sounds, Abd Soft, Non tender, No rebound -guarding or rigidity. No Cyanosis, Clubbing or edema, No new Rash or bruise, area of abscess and mons pubis, appears to be with packing, no drainage or erythema  Data Review   CBC w Diff:  Lab Results  Component Value Date   WBC 9.1 01/30/2018   HGB 9.0 (L) 01/30/2018   HCT 27.6 (L)  01/30/2018   PLT 211 01/30/2018   LYMPHOPCT 18 12/31/2017   MONOPCT 6 12/31/2017   EOSPCT 2 12/31/2017   BASOPCT 0 12/31/2017    CMP:  Lab Results  Component Value Date   NA 135 01/30/2018   K 3.5 01/30/2018   CL 103 01/30/2018   CO2 24 01/30/2018   BUN 6 01/30/2018   CREATININE 0.57 01/30/2018   CREATININE 0.64 11/21/2016   PROT 6.5 12/31/2017   ALBUMIN 3.3 (L) 12/31/2017   BILITOT 0.5 12/31/2017   ALKPHOS 73 12/31/2017   AST 17 12/31/2017   ALT 13 12/31/2017  .   Total Time in preparing paper work, data evaluation and todays exam - 35 minutes  Huey Bienenstock M.D on 02/01/2018 at 11:20 AM  [facility]   Office  [phone]

## 2018-02-04 ENCOUNTER — Encounter (HOSPITAL_COMMUNITY): Payer: Self-pay | Admitting: Nurse Practitioner

## 2018-02-04 ENCOUNTER — Inpatient Hospital Stay: Payer: Medicaid Other | Admitting: Family Medicine

## 2018-02-04 ENCOUNTER — Ambulatory Visit (HOSPITAL_COMMUNITY)
Admission: RE | Admit: 2018-02-04 | Discharge: 2018-02-04 | Disposition: A | Discharge status: Home / Self Care | Payer: Self-pay | Source: Ambulatory Visit | Attending: Nurse Practitioner | Admitting: Nurse Practitioner

## 2018-02-04 VITALS — BP 118/70 | HR 73 | Ht 63.0 in | Wt 152.2 lb

## 2018-02-04 DIAGNOSIS — I480 Paroxysmal atrial fibrillation: Secondary | ICD-10-CM | POA: Insufficient documentation

## 2018-02-04 DIAGNOSIS — I5032 Chronic diastolic (congestive) heart failure: Secondary | ICD-10-CM | POA: Insufficient documentation

## 2018-02-04 DIAGNOSIS — M1990 Unspecified osteoarthritis, unspecified site: Secondary | ICD-10-CM | POA: Insufficient documentation

## 2018-02-04 DIAGNOSIS — Z7982 Long term (current) use of aspirin: Secondary | ICD-10-CM | POA: Insufficient documentation

## 2018-02-04 DIAGNOSIS — E781 Pure hyperglyceridemia: Secondary | ICD-10-CM | POA: Insufficient documentation

## 2018-02-04 DIAGNOSIS — E119 Type 2 diabetes mellitus without complications: Secondary | ICD-10-CM | POA: Insufficient documentation

## 2018-02-04 DIAGNOSIS — I491 Atrial premature depolarization: Secondary | ICD-10-CM | POA: Insufficient documentation

## 2018-02-04 DIAGNOSIS — M109 Gout, unspecified: Secondary | ICD-10-CM | POA: Insufficient documentation

## 2018-02-04 DIAGNOSIS — Z79899 Other long term (current) drug therapy: Secondary | ICD-10-CM | POA: Insufficient documentation

## 2018-02-04 DIAGNOSIS — Z7984 Long term (current) use of oral hypoglycemic drugs: Secondary | ICD-10-CM | POA: Insufficient documentation

## 2018-02-04 DIAGNOSIS — I110 Hypertensive heart disease with heart failure: Secondary | ICD-10-CM | POA: Insufficient documentation

## 2018-02-04 DIAGNOSIS — Z7901 Long term (current) use of anticoagulants: Secondary | ICD-10-CM | POA: Insufficient documentation

## 2018-02-04 NOTE — Progress Notes (Signed)
Primary Care Physician: No primary care provider on file. Referring Physician: [facility]    Emily Farmer is a 62 y.o. female with a h/o paroxysmal afib, CHF, DM, HTN, that is in the afib clinic for f/u of hospitalization  for hyperglycemic hyperosmolar  hyperglycemia  and genital boils. She was off insulin as she could not afford it. She was noted to have afib with RVR but after fluid resuscitation, she returned to SR. She had already been on Eliquis 5 mg bid, but she reports that she sometimes is off drug. She is in SR today.   Today, she denies symptoms of palpitations, chest pain, shortness of breath, orthopnea, PND, lower extremity edema, dizziness, presyncope, syncope, or neurologic sequela. The patient is tolerating medications without difficulties and is otherwise without complaint today.   Past Medical History:  Diagnosis Date  . Arthritis   . Atrial fibrillation (HCC)    a. 09/2016 in setting of pancreatitis;  b. 09/2016 Echo: EF 65-60%, no rwma, Gr2 DD, mild MR, triv TR, PASP ;  CHA2DS2VASc = 4-->Eliquis 5mg  BID.  Marland Kitchen Chronic diastolic CHF (congestive heart failure) (HCC) 01/28/2018  . Diabetes mellitus   . Gout   . Hyperlipidemia   . Hypertension   . Hypertriglyceridemia   . Pancreatitis    a. 09/2016 - Triglycerides 1,392 on admission.   Past Surgical History:  Procedure Laterality Date  . CHOLECYSTECTOMY N/A 01/04/2014   Procedure: LAPAROSCOPIC CHOLECYSTECTOMY WITH INTRAOPERATIVE CHOLANGIOGRAM;  Surgeon: Axel Filler, MD;  Location: [facility] OR;  Service: General;  Laterality: N/A;  . LUNG SURGERY      Current Outpatient Medications  Medication Sig Dispense Refill  . acetaminophen (TYLENOL) 325 MG tablet Take 2 tablets (650 mg total) by mouth every 6 (six) hours as needed for mild pain (or Fever >/= 101).    Marland Kitchen apixaban (ELIQUIS) 5 MG TABS tablet Take 1 tablet (5 mg total) by mouth 2 (two) times daily. 60 tablet 0  . aspirin EC 81 MG tablet Take 1 tablet (81 mg total) by mouth  daily. 30 tablet 0  . atorvastatin (LIPITOR) 10 MG tablet Take 1 tablet (10 mg total) by mouth daily. 30 tablet 0  . fenofibrate 160 MG tablet Take 1 tablet (160 mg total) by mouth daily. 30 tablet 0  . insulin aspart protamine- aspart (NOVOLOG MIX 70/30) (70-30) 100 UNIT/ML injection Inject 0.14 mLs (14 Units total) into the skin 2 (two) times daily with a meal. 10 mL 3  . Insulin Syringe 27G X 1/2" 0.5 ML MISC 1 application by Does not apply route 2 (two) times daily. 100 each 2  . metFORMIN (GLUCOPHAGE) 500 MG tablet Take 1 tablet (500 mg total) by mouth 2 (two) times daily with a meal. 60 tablet 0  . metoprolol tartrate (LOPRESSOR) 25 MG tablet Take 1 tablet (25 mg total) by mouth 2 (two) times daily. 60 tablet 0  . Multiple Vitamins-Minerals (CENTRUM SILVER PO) Take 1 tablet by mouth daily.    . mupirocin ointment (BACTROBAN) 2 % Place 1 application into the nose 2 (two) times daily. 22 g 0  . omega-3 acid ethyl esters (LOVAZA) 1 g capsule Take 2 capsules (2 g total) by mouth 2 (two) times daily. 120 capsule 0  . pantoprazole (PROTONIX) 40 MG tablet Take 1 tablet (40 mg total) by mouth daily. 30 tablet 0  . sulfamethoxazole-trimethoprim (BACTRIM DS,SEPTRA DS) 800-160 MG tablet Take 1 tablet by mouth 2 (two) times daily. 20 tablet 0   No  current facility-administered medications for this encounter.     No Known Allergies  Social History   Socioeconomic History  . Marital status: Single    Spouse name: Not on file  . Number of children: Not on file  . Years of education: Not on file  . Highest education level: Not on file  Occupational History  . Not on file  Social Needs  . Financial resource strain: Not on file  . Food insecurity:    Worry: Not on file    Inability: Not on file  . Transportation needs:    Medical: Not on file    Non-medical: Not on file  Tobacco Use  . Smoking status: Never Smoker  . Smokeless tobacco: Never Used  Substance and Sexual Activity  . Alcohol  use: No  . Drug use: No  . Sexual activity: Not Currently  Lifestyle  . Physical activity:    Days per week: Not on file    Minutes per session: Not on file  . Stress: Not on file  Relationships  . Social connections:    Talks on phone: Not on file    Gets together: Not on file    Attends religious service: Not on file    Active member of club or organization: Not on file    Attends meetings of clubs or organizations: Not on file    Relationship status: Not on file  . Intimate partner violence:    Fear of current or ex partner: Not on file    Emotionally abused: Not on file    Physically abused: Not on file    Forced sexual activity: Not on file  Other Topics Concern  . Not on file  Social History Narrative   Lives with her sister and does not need any assistance with ADLs.      Family History  Problem Relation Age of Onset  . Heart attack Mother   . Heart attack Father   . Diabetes Sister   . High blood pressure Neg Hx   . High Cholesterol Neg Hx     ROS- All systems are reviewed and negative except as per the HPI above  Physical Exam: Vitals:   02/04/18 1414  BP: 118/70  Pulse: 73  Weight: 69 kg  Height: 5\' 3"  (1.6 m)   Wt Readings from Last 3 Encounters:  02/04/18 69 kg  02/01/18 67.2 kg  12/31/17 72.6 kg    Labs: Lab Results  Component Value Date   NA 135 01/30/2018   K 3.5 01/30/2018   CL 103 01/30/2018   CO2 24 01/30/2018   GLUCOSE 213 (H) 01/30/2018   BUN 6 01/30/2018   CREATININE 0.57 01/30/2018   CALCIUM 8.1 (L) 01/30/2018   PHOS 1.7 (L) 06/19/2011   MG 1.4 (L) 01/28/2018   Lab Results  Component Value Date   INR 1.03 10/05/2016   Lab Results  Component Value Date   CHOL 343 (H) 05/04/2017   HDL 41 05/04/2017   LDLCALC Comment 05/04/2017   TRIG 816 (HH) 05/04/2017     GEN- The patient is well appearing, alert and oriented x 3 today.   Head- normocephalic, atraumatic Eyes-  Sclera clear, conjunctiva pink Ears- hearing  intact Oropharynx- clear Neck- supple, no JVP Lymph- no cervical lymphadenopathy Lungs- Clear to ausculation bilaterally, normal work of breathing Heart- Regular rate and rhythm, no murmurs, rubs or gallops, PMI not laterally displaced GI- soft, NT, ND, + BS Extremities- no clubbing, cyanosis, or edema  MS- no significant deformity or atrophy Skin- no rash or lesion Psych- euthymic mood, full affect Neuro- strength and sensation are intact  EKG- SR with PAC's,eliquis 5 mg bid  at 74 bpm, pr int 144 ms, qrs int 58 ms, qtc 401 ms Epic records reviewed    Assessment and Plan: 1. Paroxysmal afib  Discussed with pt general education re afib and importance of taking meds on a regular basis Afib in hospital that resolved with fluid resuscitation In SR today Continue metoprolol 25 mg bid  2. CHA2DS2VASc Continue eliquis 5 mg bid Take on regular basis  3. DM Per pcp  Has f/u with PCP 9/9   Dr. Rennis GoldenHilty per recall in December afib clinc as needed

## 2018-02-22 ENCOUNTER — Encounter: Payer: Self-pay | Admitting: Family Medicine

## 2018-02-22 ENCOUNTER — Ambulatory Visit: Payer: Self-pay | Attending: Family Medicine | Admitting: Family Medicine

## 2018-02-22 ENCOUNTER — Other Ambulatory Visit: Payer: Self-pay

## 2018-02-22 VITALS — BP 124/77 | HR 75 | Temp 98.4°F | Resp 18 | Ht 63.0 in | Wt 148.2 lb

## 2018-02-22 DIAGNOSIS — I4891 Unspecified atrial fibrillation: Secondary | ICD-10-CM

## 2018-02-22 DIAGNOSIS — M109 Gout, unspecified: Secondary | ICD-10-CM | POA: Insufficient documentation

## 2018-02-22 DIAGNOSIS — Z794 Long term (current) use of insulin: Secondary | ICD-10-CM

## 2018-02-22 DIAGNOSIS — I110 Hypertensive heart disease with heart failure: Secondary | ICD-10-CM | POA: Insufficient documentation

## 2018-02-22 DIAGNOSIS — I480 Paroxysmal atrial fibrillation: Secondary | ICD-10-CM | POA: Insufficient documentation

## 2018-02-22 DIAGNOSIS — I5032 Chronic diastolic (congestive) heart failure: Secondary | ICD-10-CM

## 2018-02-22 DIAGNOSIS — Z7901 Long term (current) use of anticoagulants: Secondary | ICD-10-CM | POA: Insufficient documentation

## 2018-02-22 DIAGNOSIS — E1101 Type 2 diabetes mellitus with hyperosmolarity with coma: Secondary | ICD-10-CM | POA: Insufficient documentation

## 2018-02-22 DIAGNOSIS — M1990 Unspecified osteoarthritis, unspecified site: Secondary | ICD-10-CM | POA: Insufficient documentation

## 2018-02-22 DIAGNOSIS — I10 Essential (primary) hypertension: Secondary | ICD-10-CM

## 2018-02-22 DIAGNOSIS — E782 Mixed hyperlipidemia: Secondary | ICD-10-CM

## 2018-02-22 DIAGNOSIS — D649 Anemia, unspecified: Secondary | ICD-10-CM

## 2018-02-22 DIAGNOSIS — E1165 Type 2 diabetes mellitus with hyperglycemia: Secondary | ICD-10-CM

## 2018-02-22 LAB — GLUCOSE, POCT (MANUAL RESULT ENTRY): POC Glucose: 276 mg/dL — AB (ref 70–99)

## 2018-02-22 MED ORDER — GLUCOSE BLOOD VI STRP: ORAL_STRIP | 11 refills | Status: DC

## 2018-02-22 MED ORDER — METOPROLOL TARTRATE 25 MG PO TABS: 25.0000 mg | ORAL_TABLET | Freq: Two times a day (BID) | ORAL | 11 refills | Status: DC

## 2018-02-22 MED ORDER — TRUEPLUS LANCETS 28G MISC: 11 refills | Status: DC

## 2018-02-22 MED ORDER — PANTOPRAZOLE SODIUM 40 MG PO TBEC: 40.0000 mg | DELAYED_RELEASE_TABLET | Freq: Every day | ORAL | 11 refills | Status: DC

## 2018-02-22 MED ORDER — FENOFIBRATE 160 MG PO TABS: 160.0000 mg | ORAL_TABLET | Freq: Every day | ORAL | 11 refills | Status: DC

## 2018-02-22 MED ORDER — TRUE METRIX METER W/DEVICE KIT: PACK | 0 refills | Status: DC

## 2018-02-22 MED ORDER — APIXABAN 5 MG PO TABS: 5.0000 mg | ORAL_TABLET | Freq: Two times a day (BID) | ORAL | 11 refills | Status: DC

## 2018-02-22 MED ORDER — ATORVASTATIN CALCIUM 10 MG PO TABS: 10.0000 mg | ORAL_TABLET | Freq: Every day | ORAL | 11 refills | Status: DC

## 2018-02-22 MED ORDER — INSULIN GLARGINE 100 UNIT/ML SOLOSTAR PEN: PEN_INJECTOR | SUBCUTANEOUS | 11 refills | Status: DC

## 2018-02-22 NOTE — Progress Notes (Signed)
Refill on lipitor protonix  Flu shot, tdap

## 2018-02-22 NOTE — Progress Notes (Signed)
Subjective:    Patient ID: Emily Farmer, female    DOB: 22-Feb-1956, 62 y.o.   MRN: [id number]  HPI 62 year old female new to the practice who is status post recent hospitalization from 01/28/2018 through 02/01/2018 for type 2 diabetes with hyperglycemia, paroxysmal atrial fibrillation and hyperglycemic hyperosmolar nonketotic coma.  Patient's blood sugar on 01/28/2018 was elevated at 563.  Patient also with anemia with a hemoglobin of 9.0 on 01/30/2018.  Patient states that prior to her hospitalization, she had run out of her insulin and could not afford to get it refilled.  Patient states that she is now taking the insulin prescribed at hospital discharge but patient states that she is only taking it once per day at night like she did with her prior insulin.  Patient reports that she believes she needs a new glucometer along with strips so that she can check her blood sugars on a regular basis.  Patient denies any current increased thirst or urinary frequency.  No dysuria.  Patient does have fatigue.  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  Patient reports that she is taking her blood pressure medicine and has had no headaches or dizziness related to her blood pressure.  Patient is taking her blood thinning medicine but states that she has noticed some bruising.  Patient denies any unusual bleeding.  Patient denies any chest pain or palpitations, no shortness of breath or cough.  Patient believes that her abnormal heart rhythm is not causing her any problems.  Patient denies any peripheral edema related to CHF.  Patient states that overall she is feeling better since her recent hospitalization. Past Medical History:  Diagnosis Date  . Arthritis   . Atrial fibrillation ([place])    a. 09/2016 in setting of pancreatitis;  b. 09/2016 Echo: EF 65-60%, no rwma, Gr2 DD, mild MR, triv TR, PASP 81mHg;  CHA2DS2VASc = 4-->Eliquis 536mBID.  . Marland Kitchenhronic  diastolic CHF (congestive heart failure) ([facility]CD[facility] [place]8/15/2019  . Diabetes mellitus   . Gout   . Hyperlipidemia   . Hypertension   . Hypertriglyceridemia   . Pancreatitis    a. 09/2016 - Triglycerides 1,392 on admission.   Past Surgical History:  Procedure Laterality Date  . CHOLECYSTECTOMY N/A 01/04/2014   Procedure: LAPAROSCOPIC CHOLECYSTECTOMY WITH INTRAOPERATIVE CHOLANGIOGRAM;  Surgeon: ArRalene OkMD;  Location: [facility] Service: General;  Laterality: N/A;  . LUNG SURGERY     Family History  Problem Relation Age of Onset  . Heart attack Mother   . Heart attack Father   . Diabetes Sister   . High blood pressure Neg Hx   . High Cholesterol Neg Hx    Social History   Tobacco Use  . Smoking status: Never Smoker  . Smokeless tobacco: Never Used  Substance Use Topics  . Alcohol use: No  . Drug use: No  No Known Allergies   Review of Systems  Constitutional: Positive for fatigue. Negative for chills and fever.  HENT: Negative for sore throat and trouble swallowing.   Respiratory: Negative for cough and shortness of breath.   Cardiovascular: Negative for chest pain, palpitations and leg swelling.  Gastrointestinal: Positive for blood in stool. Negative for abdominal distention and abdominal pain.  Endocrine: Negative for polydipsia, polyphagia and polyuria.  Genitourinary: Negative for dysuria and frequency.  Musculoskeletal: Positive for arthralgias. Negative for gait problem.  Neurological: Negative for dizziness and headaches.  Objective:   Physical Exam  BP 124/77   Pulse 75   Temp 98.4 F (36.9 C) (Oral)   Resp 18   Ht '5\' 3"'  (1.6 m)   Wt 148 lb 3.2 oz (67.2 kg)   SpO2 95%   BMI 26.25 kg/m   General-well-nourished, well-developed older female in no acute distress ENT-TMs gray, nares and oropharynx are within normal Neck-supple, no lymphadenopathy, no thyromegaly, no carotid bruit appreciated on exam Cardiovascular- patient appears to be in normal sinus  rhythm, normal rate Lungs-clear to auscultation bilaterally Abdomen-soft, nontender Back-no CVA tenderness Extremities-no edema Diabetic foot exam- patient with no active skin breakdown on the feet.  Patient does have thickened slightly discolored right great toenail which appears consistent with onychomycosis.  Patient with 1+ dorsalis pedis and posterior tibial pulses.  Patient with near normal monofilament exam with sensation in 10 out of 10 areas tested however patient did have some decreased sensation on the bilateral toes and heels Psychologic- normal mood and judgment        Assessment & Plan:  1. Type 2 diabetes mellitus with hyperglycemia, with long-term current use of insulin ([place]) Patient had run out of her insulin prior to her recent hospitalization for hyperglycemia.  Patient was discharged on 70/30 insulin due to cost issues with obtaining her once daily long-acting insulin.  Patient however has only been taking the 70/30 insulin once per day as this is how she was previously taking her long-acting insulin.  I discussed with the patient that her should be dosed twice per day.  Patient should take the medicine before her first meal of the day and before her evening meal.  I discussed with the patient that I will send in a prescription for Lantus which she took in the past to this pharmacy to see if she is eligible for patient assistance program.  I believe that once daily insulin is likely the most effective way for patient to control her blood sugars and remain compliant.  Patient is also provided with prescription so that she can obtain free glucometer and supplies through our pharmacy.  Patient will have CMP in follow-up of recent abnormal labs during hospitalization - Glucose (CBG) - Comprehensive metabolic panel - Blood Glucose Monitoring Suppl (TRUE METRIX METER) w/Device KIT; Use to monitor blood sugar as directed  Dispense: 1 kit; Refill: 0 - glucose blood (TRUE METRIX BLOOD  GLUCOSE TEST) test strip; Use as instructed to check blood sugars 3 times per day  Dispense: 100 each; Refill: 11 - TRUEPLUS LANCETS 28G MISC; Use to check blood sugar 3 times per day  Dispense: 100 each; Refill: 11 - atorvastatin (LIPITOR) 10 MG tablet; Take 1 tablet (10 mg total) by mouth daily.  Dispense: 30 tablet; Refill: 11 - Insulin Glargine (LANTUS SOLOSTAR) 100 UNIT/ML Solostar Pen; Inject 15 units once per day  Dispense: 5 pen; Refill: 11  2. Atrial fibrillation with RVR ([place]) Patient with atrial fibrillation which appears to be intermittent or paroxysmal as she appears to be in sinus rhythm at today's visit.  Discussed the importance of remaining compliant with the use of Eliquis to reduce stroke risk. - apixaban (ELIQUIS) 5 MG TABS tablet; Take 1 tablet (5 mg total) by mouth 2 (two) times daily.  Dispense: 60 tablet; Refill: 11  3. Normocytic anemia On review of labs from patient's recent hospitalization, patient with anemia.  Patient will have CBC at today's visit and follow-up of her anemia.  Patient will be placed on pantoprazole for  stomach protection due to long-term use of anticoagulant medication secondary to atrial fibrillation - CBC with Differential - pantoprazole (PROTONIX) 40 MG tablet; Take 1 tablet (40 mg total) by mouth daily.  Dispense: 30 tablet; Refill: 11  4. Chronic diastolic CHF (congestive heart failure) ([place]) Per medical records, patient with history of chronic diastolic CHF.  This appears to be stable.  Patient will continue use of medications to control blood pressure.  I am not sure why patient is not on an ACE inhibitor given her history of diabetes as well as heart failure but patient's chart will be reviewed  5. Mixed hyperlipidemia Patient is asked to continue the use of Lipitor to help with hyperlipidemia as well as to help reduce increased risk of CAD associated with being diabetic - atorvastatin (LIPITOR) 10 MG tablet; Take 1 tablet (10 mg total) by  mouth daily.  Dispense: 30 tablet; Refill: 11 - fenofibrate 160 MG tablet; Take 1 tablet (160 mg total) by mouth daily.  Dispense: 30 tablet; Refill: 11  6. Essential hypertension Patient will continue use of metoprolol  An After Visit Summary was printed and given to the patient.  Return in about 3 months (around 05/24/2018) for 4 weeks with CPP; 3 months.

## 2018-02-23 LAB — CBC WITH DIFFERENTIAL/PLATELET
Basophils Absolute: 0 x10E3/uL (ref 0.0–0.2)
Basos: 1 %
EOS (ABSOLUTE): 0.5 x10E3/uL — ABNORMAL HIGH (ref 0.0–0.4)
Eos: 8 %
Hematocrit: 31.7 % — ABNORMAL LOW (ref 34.0–46.6)
Hemoglobin: 10.2 g/dL — ABNORMAL LOW (ref 11.1–15.9)
Immature Grans (Abs): 0 x10E3/uL (ref 0.0–0.1)
Immature Granulocytes: 0 %
Lymphocytes Absolute: 2 x10E3/uL (ref 0.7–3.1)
Lymphs: 32 %
MCH: 27.5 pg (ref 26.6–33.0)
MCHC: 32.2 g/dL (ref 31.5–35.7)
MCV: 85 fL (ref 79–97)
Monocytes Absolute: 0.6 x10E3/uL (ref 0.1–0.9)
Monocytes: 9 %
Neutrophils Absolute: 3.1 x10E3/uL (ref 1.4–7.0)
Neutrophils: 50 %
Platelets: 231 x10E3/uL (ref 150–450)
RBC: 3.71 x10E6/uL — ABNORMAL LOW (ref 3.77–5.28)
RDW: 12.9 % (ref 12.3–15.4)
WBC: 6.1 x10E3/uL (ref 3.4–10.8)

## 2018-02-23 LAB — COMPREHENSIVE METABOLIC PANEL WITH GFR
ALT: 10 IU/L (ref 0–32)
AST: 12 IU/L (ref 0–40)
Albumin/Globulin Ratio: 1.8 (ref 1.2–2.2)
Albumin: 4.2 g/dL (ref 3.6–4.8)
Alkaline Phosphatase: 52 IU/L (ref 39–117)
BUN/Creatinine Ratio: 41 — ABNORMAL HIGH (ref 12–28)
BUN: 33 mg/dL — ABNORMAL HIGH (ref 8–27)
Bilirubin Total: <0.2 mg/dL (ref 0.0–1.2)
CO2: 24 mmol/L (ref 20–29)
Calcium: 9.6 mg/dL (ref 8.7–10.3)
Chloride: 97 mmol/L (ref 96–106)
Creatinine, Ser: 0.81 mg/dL (ref 0.57–1.00)
GFR calc Af Amer: 91 mL/min/1.73
GFR calc non Af Amer: 79 mL/min/1.73
Globulin, Total: 2.3 g/dL (ref 1.5–4.5)
Glucose: 300 mg/dL — ABNORMAL HIGH (ref 65–99)
Potassium: 4.8 mmol/L (ref 3.5–5.2)
Sodium: 135 mmol/L (ref 134–144)
Total Protein: 6.5 g/dL (ref 6.0–8.5)

## 2018-03-03 ENCOUNTER — Telehealth: Payer: Self-pay

## 2018-03-03 NOTE — Telephone Encounter (Signed)
-----   Message from Cain Saupeammie Fulp, MD sent at 02/23/2018  8:46 AM EDT ----- Please let patient know that her blood sugar on her blood work was 300. Remind her that with the insulin that she is taking s/p hospitalization that she is supposed to take the 70/30 twice per day. Once before her her morning meal and again before her evening meal. Her once per day insulin lantus is being ordered at our pharmacy. Her anemia has improved from 9.0 to 10.2

## 2018-03-03 NOTE — Telephone Encounter (Signed)
Patient was called, verified dob, and was given pcp note. No further questions and verbalized understanding.

## 2018-03-07 ENCOUNTER — Emergency Department (HOSPITAL_COMMUNITY)
Admission: EM | Admit: 2018-03-07 | Discharge: 2018-03-07 | Disposition: A | Discharge status: Home / Self Care | Payer: Medicaid Other | Attending: Emergency Medicine | Admitting: Emergency Medicine

## 2018-03-07 ENCOUNTER — Emergency Department (HOSPITAL_COMMUNITY): Payer: Medicaid Other

## 2018-03-07 ENCOUNTER — Encounter (HOSPITAL_COMMUNITY): Payer: Self-pay | Admitting: Emergency Medicine

## 2018-03-07 ENCOUNTER — Other Ambulatory Visit: Payer: Self-pay

## 2018-03-07 DIAGNOSIS — Z794 Long term (current) use of insulin: Secondary | ICD-10-CM | POA: Insufficient documentation

## 2018-03-07 DIAGNOSIS — I480 Paroxysmal atrial fibrillation: Secondary | ICD-10-CM | POA: Insufficient documentation

## 2018-03-07 DIAGNOSIS — I110 Hypertensive heart disease with heart failure: Secondary | ICD-10-CM | POA: Insufficient documentation

## 2018-03-07 DIAGNOSIS — Z79899 Other long term (current) drug therapy: Secondary | ICD-10-CM | POA: Insufficient documentation

## 2018-03-07 DIAGNOSIS — I2510 Atherosclerotic heart disease of native coronary artery without angina pectoris: Secondary | ICD-10-CM | POA: Insufficient documentation

## 2018-03-07 DIAGNOSIS — Z7982 Long term (current) use of aspirin: Secondary | ICD-10-CM | POA: Insufficient documentation

## 2018-03-07 DIAGNOSIS — E119 Type 2 diabetes mellitus without complications: Secondary | ICD-10-CM | POA: Insufficient documentation

## 2018-03-07 DIAGNOSIS — Z7901 Long term (current) use of anticoagulants: Secondary | ICD-10-CM | POA: Insufficient documentation

## 2018-03-07 DIAGNOSIS — Z9049 Acquired absence of other specified parts of digestive tract: Secondary | ICD-10-CM | POA: Insufficient documentation

## 2018-03-07 DIAGNOSIS — I5032 Chronic diastolic (congestive) heart failure: Secondary | ICD-10-CM | POA: Insufficient documentation

## 2018-03-07 LAB — BASIC METABOLIC PANEL
Anion gap: 12 (ref 5–15)
BUN: 30 mg/dL — ABNORMAL HIGH (ref 6–20)
CO2: 22 mmol/L (ref 22–32)
Calcium: 10.2 mg/dL (ref 8.9–10.3)
Chloride: 101 mmol/L (ref 98–111)
Creatinine, Ser: 0.99 mg/dL (ref 0.44–1.00)
GFR calc Af Amer: >60 mL/min (ref >60)
GFR calc non Af Amer: >60 mL/min (ref >60)
Glucose, Bld: 390 mg/dL — ABNORMAL HIGH (ref 70–99)
Potassium: 4.1 mmol/L (ref 3.5–5.1)
Sodium: 135 mmol/L (ref 135–145)

## 2018-03-07 LAB — I-STAT TROPONIN, ED: Troponin i, poc: 0 ng/mL (ref 0.00–0.08)

## 2018-03-07 LAB — CBC
HCT: 29.3 % — ABNORMAL LOW (ref 36.0–46.0)
Hemoglobin: 9.3 g/dL — ABNORMAL LOW (ref 12.0–15.0)
MCH: 28 pg (ref 26.0–34.0)
MCHC: 31.7 g/dL (ref 30.0–36.0)
MCV: 88.3 fL (ref 78.0–100.0)
Platelets: 251 10*3/uL (ref 150–400)
RBC: 3.32 MIL/uL — ABNORMAL LOW (ref 3.87–5.11)
RDW: 13.1 % (ref 11.5–15.5)
WBC: 8.2 10*3/uL (ref 4.0–10.5)

## 2018-03-07 LAB — MAGNESIUM: Magnesium: 1.4 mg/dL — ABNORMAL LOW (ref 1.7–2.4)

## 2018-03-07 MED ORDER — SODIUM CHLORIDE 0.9% FLUSH: 3.0000 mL | INTRAVENOUS | Status: DC | PRN

## 2018-03-07 MED ORDER — SODIUM CHLORIDE 0.9% FLUSH: 3.0000 mL | Freq: Two times a day (BID) | INTRAVENOUS | Status: DC

## 2018-03-07 MED ORDER — PROPOFOL 10 MG/ML IV BOLUS
0.5000 mg/kg | Freq: Once | INTRAVENOUS | Status: DC
  Filled 2018-03-07: qty 20

## 2018-03-07 MED ORDER — SODIUM CHLORIDE 0.9 % IV SOLN
250.0000 mL | INTRAVENOUS | Status: DC
  Administered 2018-03-07: 250 mL via INTRAVENOUS

## 2018-03-07 MED ORDER — DILTIAZEM LOAD VIA INFUSION
15.0000 mg | Freq: Once | INTRAVENOUS | Status: AC
  Administered 2018-03-07: 15 mg via INTRAVENOUS
  Filled 2018-03-07: qty 15

## 2018-03-07 MED ORDER — MAGNESIUM SULFATE IN D5W 1-5 GM/100ML-% IV SOLN
1.0000 g | Freq: Once | INTRAVENOUS | Status: AC
  Administered 2018-03-07: 1 g via INTRAVENOUS
  Filled 2018-03-07: qty 100

## 2018-03-07 MED ORDER — PROPOFOL 10 MG/ML IV BOLUS
INTRAVENOUS | Status: AC | PRN
  Administered 2018-03-07: 35 mg via INTRAVENOUS

## 2018-03-07 MED ORDER — DILTIAZEM HCL-DEXTROSE 100-5 MG/100ML-% IV SOLN (PREMIX)
5.0000 mg/h | INTRAVENOUS | Status: DC
  Administered 2018-03-07: 5 mg/h via INTRAVENOUS
  Filled 2018-03-07: qty 100

## 2018-03-07 NOTE — Discharge Instructions (Signed)
Please follow-up with the atrial fibrillation clinic by calling them tomorrow to let them know you are here and had a cardioversion.  Continue taking medications as prescribed.  Please return to emergency department if you develop any new or worsening symptoms.

## 2018-03-07 NOTE — ED Provider Notes (Signed)
[facility] EMERGENCY DEPARTMENT Provider Note   CSN: [id number] Arrival date & time: 03/07/18  1029     History   Chief Complaint Chief Complaint  Patient presents with  . Atrial Fibrillation    HPI Emily Farmer is a 62 y.o. female with history of hypertension, hyperlipidemia, diabetes, atrial fibrillation anticoagulated on Eliquis who presents with heart palpitations, shortness of breath, and lightheadedness.  Patient reports she was at work when she all of a sudden got the above feelings.  EMS arrived and found the patient to be in atrial fibrillation with rate in the 120s to 140s.  Patient denies any chest pain, abdominal pain, nausea, vomiting, urinary symptoms.  Patient takes her medication as prescribed.  She has never been cardioverted.  She has been told that she goes in and out of atrial fibrillation, but does not usually notice a feeling of heart palpitations she does today.  She is followed by Dr. Debara Pickett.  HPI  Past Medical History:  Diagnosis Date  . Arthritis   . Atrial fibrillation ([place])    a. 09/2016 in setting of pancreatitis;  b. 09/2016 Echo: EF 65-60%, no rwma, Gr2 DD, mild MR, triv TR, PASP 24mHg;  CHA2DS2VASc = 4-->Eliquis 586mBID.  . Marland Kitchenhronic diastolic CHF (congestive heart failure) ([facility]8/15/2019  . Diabetes mellitus   . Gout   . Hyperlipidemia   . Hypertension   . Hypertriglyceridemia   . Pancreatitis    a. 09/2016 - Triglycerides 1,392 on admission.    Patient Active Problem List   Diagnosis Date Noted  . Hyperosmolar non-ketotic state in patient with type 2 diabetes mellitus (HC[facility]08/16/2019  . Hyperglycemic hyperosmolar nonketotic coma ([facility]08/15/2019  . Atrial fibrillation with RVR (HC[facility] [place]08/15/2019  . Non-compliance 01/28/2018  . Hyponatremia 01/28/2018  . Normocytic anemia 01/28/2018  . Chronic diastolic CHF (congestive heart failure) (HC[facility] [place]08/15/2019  . Labial abscess 01/28/2018  . Orthostatic hypotension 01/28/2018  .  Mixed hyperlipidemia 06/01/2017  . Acute pancreatitis 10/04/2016  . Paroxysmal A-fib (HCOrest[place]04/21/2018  . Cholecystitis, acute with cholelithiasis 01/04/2014  . Hypokalemia 01/04/2014  . Pyelonephritis 02/15/2013  . Dehydration 02/15/2013  . Hyperglycemia 02/15/2013  . Increased anion gap metabolic acidosis 0961/44/3154. Essential hypertension 02/15/2013  . Hypertriglyceridemia 02/15/2013  . Diabetes mellitus type 2 in obese (HCBrid[place]09/07/2012  . Coronary atherosclerosis seen on CT 02/15/2013  . Nausea and vomiting 02/15/2013  . Diarrhea 02/15/2013    Past Surgical History:  Procedure Laterality Date  . CHOLECYSTECTOMY N/A 01/04/2014   Procedure: LAPAROSCOPIC CHOLECYSTECTOMY WITH INTRAOPERATIVE CHOLANGIOGRAM;  Surgeon: ArRalene OkMD;  Location: [facility] Service: General;  Laterality: N/A;  . LUNG SURGERY       OB History   None      Home Medications    Prior to Admission medications   Medication Sig Start Date End Date Taking? Authorizing Provider  acetaminophen (TYLENOL) 325 MG tablet Take 2 tablets (650 mg total) by mouth every 6 (six) hours as needed for mild pain (or Fever >/= 101). 02/01/18  Yes Elgergawy, DaSilver HugueninMD  apixaban (ELIQUIS) 5 MG TABS tablet Take 1 tablet (5 mg total) by mouth 2 (two) times daily. 02/22/18  Yes Fulp, Cammie, MD  aspirin EC 81 MG tablet Take 1 tablet (81 mg total) by mouth daily. 02/01/18  Yes Elgergawy, DaSilver HugueninMD  atorvastatin (LIPITOR) 10 MG tablet Take 1 tablet (10 mg total) by mouth daily. 02/22/18  Yes Fulp, Cammie, MD  fenofibrate  160 MG tablet Take 1 tablet (160 mg total) by mouth daily. 02/22/18  Yes Fulp, Cammie, MD  insulin aspart protamine- aspart (NOVOLOG MIX 70/30) (70-30) 100 UNIT/ML injection Inject 0.14 mLs (14 Units total) into the skin 2 (two) times daily with a meal. 02/01/18  Yes Elgergawy, Silver Huguenin, MD  Insulin Glargine (LANTUS SOLOSTAR) 100 UNIT/ML Solostar Pen Inject 15 units once per day 02/22/18  Yes Fulp, Cammie, MD    metFORMIN (GLUCOPHAGE) 500 MG tablet Take 1 tablet (500 mg total) by mouth 2 (two) times daily with a meal. 02/01/18  Yes Elgergawy, Silver Huguenin, MD  metoprolol tartrate (LOPRESSOR) 25 MG tablet Take 1 tablet (25 mg total) by mouth 2 (two) times daily. 02/22/18  Yes Fulp, Cammie, MD  Multiple Vitamins-Minerals (CENTRUM SILVER PO) Take 1 tablet by mouth daily.   Yes [provider]  mupirocin ointment (BACTROBAN) 2 % Place 1 application into the nose 2 (two) times daily. Patient taking differently: Place 1 application into the nose 2 (two) times daily as needed.  02/01/18  Yes Elgergawy, Silver Huguenin, MD  omega-3 acid ethyl esters (LOVAZA) 1 g capsule Take 2 capsules (2 g total) by mouth 2 (two) times daily. 02/01/18  Yes Elgergawy, Silver Huguenin, MD  pantoprazole (PROTONIX) 40 MG tablet Take 1 tablet (40 mg total) by mouth daily. 02/22/18  Yes Fulp, Cammie, MD  Blood Glucose Monitoring Suppl (TRUE METRIX METER) w/Device KIT Use to monitor blood sugar as directed 02/22/18   Fulp, Cammie, MD  glucose blood (TRUE METRIX BLOOD GLUCOSE TEST) test strip Use as instructed to check blood sugars 3 times per day 02/22/18   Fulp, Cammie, MD  Insulin Syringe 27G X 1/2" 0.5 ML MISC 1 application by Does not apply route 2 (two) times daily. 02/01/18   Elgergawy, Silver Huguenin, MD  TRUEPLUS LANCETS 28G MISC Use to check blood sugar 3 times per day 02/22/18   Antony Blackbird, MD    Family History Family History  Problem Relation Age of Onset  . Heart attack Mother   . Heart attack Father   . Diabetes Sister   . High blood pressure Neg Hx   . High Cholesterol Neg Hx     Social History Social History   Tobacco Use  . Smoking status: Never Smoker  . Smokeless tobacco: Never Used  Substance Use Topics  . Alcohol use: No  . Drug use: No     Allergies   Patient has no known allergies.   Review of Systems Review of Systems  Constitutional: Negative for chills and fever.  HENT: Negative for facial swelling and sore  throat.   Respiratory: Positive for shortness of breath.   Cardiovascular: Positive for palpitations. Negative for chest pain.  Gastrointestinal: Negative for abdominal pain, nausea and vomiting.  Genitourinary: Negative for dysuria.  Musculoskeletal: Negative for back pain.  Skin: Negative for rash and wound.  Neurological: Positive for light-headedness. Negative for headaches.  Psychiatric/Behavioral: The patient is not nervous/anxious.      Physical Exam Updated Vital Signs BP 129/64   Pulse 68   Temp 97.8 F (36.6 C) (Oral)   Resp 15   Ht _0  (1.6 m)   Wt 67.2 kg   SpO2 100%   BMI 26.25 kg/m   Physical Exam  Constitutional: She appears well-developed and well-nourished. No distress.  HENT:  Head: Normocephalic and atraumatic.  Mouth/Throat: Oropharynx is clear and moist. No oropharyngeal exudate.  Eyes: Pupils are equal, round, and reactive to light.  Conjunctivae are normal. Right eye exhibits no discharge. Left eye exhibits no discharge. No scleral icterus.  Neck: Normal range of motion. Neck supple. No thyromegaly present.  Cardiovascular: Normal heart sounds and intact distal pulses. An irregularly irregular rhythm present. Tachycardia present. Exam reveals no gallop and no friction rub.  No murmur heard. Pulmonary/Chest: Effort normal and breath sounds normal. No stridor. No respiratory distress. She has no wheezes. She has no rales.  Abdominal: Soft. Bowel sounds are normal. She exhibits no distension. There is no tenderness. There is no rebound and no guarding.  Musculoskeletal: She exhibits no edema.  Lymphadenopathy:    She has no cervical adenopathy.  Neurological: She is alert. Coordination normal.  Skin: Skin is warm and dry. No rash noted. She is not diaphoretic. No pallor.  Psychiatric: She has a normal mood and affect.  Nursing note and vitals reviewed.    ED Treatments / Results  Labs (all labs ordered are listed, but only abnormal results are  displayed) Labs Reviewed  BASIC METABOLIC PANEL - Abnormal; Notable for the following components:      Result Value   Glucose, Bld 390 (*)    BUN 30 (*)    All other components within normal limits  MAGNESIUM - Abnormal; Notable for the following components:   Magnesium 1.4 (*)    All other components within normal limits  CBC - Abnormal; Notable for the following components:   RBC 3.32 (*)    Hemoglobin 9.3 (*)    HCT 29.3 (*)    All other components within normal limits  I-STAT TROPONIN, ED    EKG EKG Interpretation  Date/Time:  Sunday March 07 2018 10:39:10 EDT Ventricular Rate:  139 PR Interval:    QRS Duration: 72 QT Interval:  300 QTC Calculation: 457 R Axis:   52 Text Interpretation:  Atrial fibrillation Low voltage, precordial leads Borderline repolarization abnormality a fib is new since last tracing Confirmed by Dorie Rank [phone]) on 03/07/2018 10:45:45 AM   Radiology Dg Chest Portable 1 View  Result Date: 03/07/2018 CLINICAL DATA:  pt coming from work after having sudden onset of dizziness and diffuse chest pain. Pt recently diagnosed with Afib 2 months ago. Hx of DM, right lung surgery, and HTN; non-smoker EXAM: PORTABLE CHEST 1 VIEW COMPARISON:  10/05/2016 FINDINGS: Cardiac silhouette is normal size. No mediastinal or hilar masses. No evidence of adenopathy. Stable postsurgical changes at the right lateral lung base. Lungs otherwise clear. No pleural effusion or pneumothorax. Skeletal structures are grossly intact. IMPRESSION: No acute cardiopulmonary disease. Electronically Signed   By: Lajean Manes M.D.   On: 03/07/2018 11:40    Procedures Procedures (including critical care time)  Medications Ordered in ED Medications  diltiazem (CARDIZEM) 1 mg/mL load via infusion 15 mg (15 mg Intravenous Bolus from Bag 03/07/18 1106)    And  diltiazem (CARDIZEM) 100 mg in dextrose 5% 132m (1 mg/mL) infusion (0 mg/hr Intravenous Stopped 03/07/18 1401)  sodium chloride  flush (NS) 0.9 % injection 3 mL (3 mLs Intravenous Not Given 03/07/18 1251)  sodium chloride flush (NS) 0.9 % injection 3 mL (has no administration in time range)  0.9 %  sodium chloride infusion (250 mLs Intravenous New Bag/Given 03/07/18 1251)  propofol (DIPRIVAN) 10 mg/mL bolus/IV push 33.6 mg (has no administration in time range)  magnesium sulfate IVPB 1 g 100 mL ( Intravenous Stopped 03/07/18 1350)  propofol (DIPRIVAN) 10 mg/mL bolus/IV push (35 mg Intravenous Given 03/07/18 1358)  Initial Impression / Assessment and Plan / ED Course  I have reviewed the triage vital signs and the nursing notes.  Pertinent labs & imaging results that were available during my care of the patient were reviewed by me and considered in my medical decision making (see chart for details).     CHA2DS2/VAS Stroke Risk Points  Current as of 13 minutes ago     5 >= 2 Points: High Risk  1 - 1.99 Points: Medium Risk  0 Points: Low Risk    The patient's score has not changed in the past year.:  No Change     Details    This score determines the patient's risk of having a stroke if the  patient has atrial fibrillation.       Points Metrics  1 Has Congestive Heart Failure:  Yes    Current as of 13 minutes ago  1 Has Vascular Disease:  Yes    Current as of 13 minutes ago  1 Has Hypertension:  Yes    Current as of 13 minutes ago  0 Age:  39    Current as of 13 minutes ago  1 Has Diabetes:  Yes    Current as of 13 minutes ago  0 Had Stroke:  No  Had TIA:  No  Had thromboembolism:  No    Current as of 13 minutes ago  1 Female:  Yes    Current as of 13 minutes ago        Patient presenting in atrial fibrillation with elevated heart rate.  Rate controlled with diltiazem, however patient remained in atrial fibrillation.  Patient underwent a successful cardioversion back into normal sinus rhythm.  See Dr. Johnsie Kindred procedure note for sedation and cardioversion.  Labs are stable for the patient, however mild  hypomagnesemia.  Replaced in the ED.  Patient will be discharged home with follow-up to cardiology.  Patient understands and agrees with plan.  Patient vitals stable throughout ED course and discharged in satisfactory condition.   Final Clinical Impressions(s) / ED Diagnoses   Final diagnoses:  Paroxysmal atrial fibrillation [facility])    ED Discharge Orders    None       Caryl Ada 03/07/18 1549    Dorie Rank, MD 03/07/18 2012

## 2018-03-07 NOTE — ED Triage Notes (Signed)
Per [facility] pt coming from work after having sudden onset of dizziness. Pt recently diagnosed with Afib 2 months ago. EMS found pt with rate of 160s, given 5mg  metoprolol and 200CC NS en route.

## 2018-03-07 NOTE — ED Provider Notes (Signed)
Patient was seen in conjunction with PA Law.  Patient presents to the ED with recurrent atrial fibrillation.  She has a known history of this but has paroxysmal atrial fibrillation is normally not in A. Fib.  She is on eliquis and has been taking her medication regularly.,   Physical Exam  BP (!) 94/54   Pulse 97   Temp 97.8 F (36.6 C) (Oral)   Resp (!) 23   Ht 1.6 m (5\' 3" )   Wt 67.2 kg   SpO2 96%   BMI 26.25 kg/m   Physical Exam  Constitutional: She appears well-developed and well-nourished. No distress.  HENT:  Head: Normocephalic and atraumatic.  Right Ear: External ear normal.  Left Ear: External ear normal.  Mouth/Throat: No oropharyngeal exudate.  Eyes: Conjunctivae are normal. Right eye exhibits no discharge. Left eye exhibits no discharge. No scleral icterus.  Neck: Neck supple. No tracheal deviation present.  Cardiovascular: Normal rate and regular rhythm.  Pulmonary/Chest: Effort normal. No stridor. No respiratory distress.  Abdominal: She exhibits no distension.  Musculoskeletal: She exhibits no edema.  Neurological: She is alert. Cranial nerve deficit: no gross deficits.  Skin: Skin is warm and dry. No rash noted.  Psychiatric: She has a normal mood and affect.  Nursing note and vitals reviewed.   ED Course/Procedures     .Sedation Date/Time: 03/07/2018 12:22 PM Performed by: Linwood Dibbles, MD Authorized by: Linwood Dibbles, MD   Consent:    Consent obtained:  Verbal   Consent given by:  Patient   Risks discussed:  Allergic reaction, dysrhythmia, inadequate sedation, nausea, prolonged hypoxia resulting in organ damage, prolonged sedation necessitating reversal, respiratory compromise necessitating ventilatory assistance and intubation and vomiting   Alternatives discussed:  Analgesia without sedation, anxiolysis and regional anesthesia Universal protocol:    Procedure explained and questions answered to patient or proxy's satisfaction: yes     Relevant documents  present and verified: yes     Test results available and properly labeled: yes     Imaging studies available: yes     Required blood products, implants, devices, and special equipment available: yes     Site/side marked: yes     Immediately prior to procedure a time out was called: yes     Patient identity confirmation method:  Verbally with patient Indications:    Procedure necessitating sedation performed by:  Physician performing sedation   Intended level of sedation:  Deep Pre-sedation assessment:    Time since last food or drink:  6   ASA classification: class 1 - normal, healthy patient     Neck mobility: normal     Mouth opening:  3 or more finger widths   Thyromental distance:  4 finger widths   Mallampati score:  I - soft palate, uvula, fauces, pillars visible   Pre-sedation assessments completed and reviewed: airway patency, cardiovascular function, hydration status, mental status, nausea/vomiting, pain level, respiratory function and temperature   Immediate pre-procedure details:    Reassessment: Patient reassessed immediately prior to procedure     Reviewed: vital signs, relevant labs/tests and NPO status     Verified: bag valve mask available, emergency equipment available, intubation equipment available, IV patency confirmed, oxygen available and suction available   Procedure details (see MAR for exact dosages):    Preoxygenation:  Nasal cannula   Sedation:  Propofol   Intra-procedure monitoring:  Blood pressure monitoring, cardiac monitor, continuous pulse oximetry, frequent LOC assessments, frequent vital sign checks and continuous capnometry   Intra-procedure  events: none     Total Provider sedation time (minutes):  20 Post-procedure details:    Post-sedation assessment completed:  03/07/2018 3:16 PM   Attendance: Constant attendance by certified staff until patient recovered     Recovery: Patient returned to pre-procedure baseline     Post-sedation assessments  completed and reviewed: airway patency, cardiovascular function, hydration status, mental status, nausea/vomiting, pain level, respiratory function and temperature     Patient is stable for discharge or admission: yes     Patient tolerance:  Tolerated well, no immediate complications  .Cardioversion Date/Time: 03/07/2018 3:15 PM Performed by: Linwood DibblesKnapp, Solace Wendorff, MD Authorized by: Linwood DibblesKnapp, Alvis Edgell, MD   Consent:    Consent obtained:  Written   Consent given by:  Patient   Risks discussed:  Death, induced arrhythmia and pain   Alternatives discussed:  Rate-control medication Universal protocol:    Procedure explained and questions answered to patient or proxy's satisfaction: yes     Relevant documents present and verified: yes     Test results available and properly labeled: yes     Imaging studies available: yes     Required blood products, implants, devices, and special equipment available: yes     Site/side marked: yes     Immediately prior to procedure a time out was called: yes   Pre-procedure details:    Cardioversion basis:  Emergent   Rhythm:  Atrial fibrillation   Electrode placement:  Anterior-posterior Patient sedated: Yes. Refer to sedation procedure documentation for details of sedation.  Attempt one:    Cardioversion mode:  Synchronous   Waveform:  Biphasic   Shock (Joules):  150   Shock outcome:  Conversion to normal sinus rhythm Post-procedure details:    Patient status:  Alert   Patient tolerance of procedure:  Tolerated well, no immediate complications    MDM  Pt responded to cardioversion.  Will plan on discharge home.  Follow up with cardioversion.       Linwood DibblesKnapp, Fernand Sorbello, MD 03/07/18 1517

## 2018-03-09 ENCOUNTER — Ambulatory Visit (INDEPENDENT_AMBULATORY_CARE_PROVIDER_SITE_OTHER): Payer: Self-pay | Admitting: Physician Assistant

## 2018-03-09 ENCOUNTER — Encounter: Payer: Self-pay | Admitting: Physician Assistant

## 2018-03-09 VITALS — BP 134/83 | HR 73 | Ht 63.0 in | Wt 151.0 lb

## 2018-03-09 DIAGNOSIS — I10 Essential (primary) hypertension: Secondary | ICD-10-CM

## 2018-03-09 DIAGNOSIS — Z79899 Other long term (current) drug therapy: Secondary | ICD-10-CM

## 2018-03-09 DIAGNOSIS — I480 Paroxysmal atrial fibrillation: Secondary | ICD-10-CM

## 2018-03-09 DIAGNOSIS — E782 Mixed hyperlipidemia: Secondary | ICD-10-CM

## 2018-03-09 DIAGNOSIS — Z794 Long term (current) use of insulin: Secondary | ICD-10-CM

## 2018-03-09 DIAGNOSIS — E1165 Type 2 diabetes mellitus with hyperglycemia: Secondary | ICD-10-CM

## 2018-03-09 MED ORDER — ATORVASTATIN CALCIUM 40 MG PO TABS: 40.0000 mg | ORAL_TABLET | Freq: Every day | ORAL | 3 refills | Status: DC

## 2018-03-09 MED ORDER — METOPROLOL TARTRATE 25 MG PO TABS: 37.5000 mg | ORAL_TABLET | Freq: Two times a day (BID) | ORAL | 3 refills | Status: DC

## 2018-03-09 MED FILL — ATORVASTATIN CALCIUM 40 MG: 40 | 30 days supply | Qty: 30 | Fill #0

## 2018-03-09 MED FILL — METOPROLOL TARTRATE 25 MG T: 25 | 30 days supply | Qty: 90 | Fill #0

## 2018-03-09 NOTE — Patient Instructions (Signed)
Medication Instructions:  INCREASE- Atorvastatin 40 mg daily INCREASE- Metoprolol Tartrate 37.5 mg (1 1/2 tablets) twice a day  If you need a refill on your cardiac medications before your next appointment, please call your pharmacy.  Labwork: Fasting Lipids and Liver HERE IN OUR OFFICE AT [facility]  Take the provided lab slips with you to the lab for your blood draw.   You will need to fast. DO NOT EAT OR DRINK PAST MIDNIGHT.   Testing/Procedures: None Ordered  Follow-Up: Your physician wants you to follow-up in: 2-3 Months with Dr Rennis GoldenHilty.      Thank you for choosing [facility]!!

## 2018-03-09 NOTE — Progress Notes (Signed)
Cardiology Office Note    Date:  03/09/2018   ID:  Emily Farmer, DOB 07-Jan-1956, MRN [id number]  PCP:  Antony Blackbird, MD  Cardiologist:  Dr. Debara Pickett  Chief Complaint  Patient presents with  . Follow-up    recent ED visit, had cardioversion for afib    History of Present Illness:  Emily Farmer is a 62 y.o. female with PMH of HTN, HLD, DM II, and atrial fibrillation in the setting of acute pancreatitis.  Last echocardiogram obtained on 10/04/2016 showed EF 65 to 70%, grade 2 DD, mild MR, mildly calcified aortic valve, peak PA pressure 30 mmHg, cannot exclude PFO.  She has been followed by Dr. Debara Pickett.  Her last visit with Dr. Debara Pickett was in December 2018, she was noncompliant with medical therapy at the time.  Her lipid profile was uncontrolled with triglycerides remain in the 800 range.  She was also noncompliant with Eliquis at the time.  She was admitted in August 2019 to the hospital with hyperosmolar hyperglycemic nonketotic state after she was unable to afford insulin.  She was also in atrial fibrillation with RVR at the time.  After fluid resuscitation, her atrial fibrillation resolved.  She was followed post hospital by Laroy Apple of atrial fibrillation clinic on 02/04/2018, she was in sinus rhythm at the time.  She returned to the ED on 03/07/2018 with recurrent atrial fibrillation.  She informed the ED physician she was compliant with Eliquis at the time, therefore she underwent DC cardioversion in the emergency room and was subsequently discharged to follow-up with cardiology as outpatient.  Patient presents today for cardiology office visit.  Since her cardioversion in the emergency room, she has not had any recurrence of palpitation symptom.  Yesterday, she did have a episode of nausea and vomiting, this was a one-time episode and that has not recurred since.  She denies any chest pain or shortness of breath.  She has trace amount of edema in the left lower extremity, this is normal for her.   Upon review of her previous lab work, she has very uncontrolled cholesterol in November 2018, I will increase her Lipitor to 40 mg daily.  She will need to continue on the fenofibrate and Lovaza.  She will need a repeat fasting lipid panel and LFTs in 6 to 8 weeks.  Her blood pressure stable, I will further uptitrate rate control regimen by increasing metoprolol to 37.5 mg twice daily.  She has been getting Eliquis [facility] and has not had any problem obtaining the medication.   Past Medical History:  Diagnosis Date  . Arthritis   . Atrial fibrillation ([place])    a. 09/2016 in setting of pancreatitis;  b. 09/2016 Echo: EF 65-60%, no rwma, Gr2 DD, mild MR, triv TR, PASP 36mHg;  CHA2DS2VASc = 4-->Eliquis 53mBID.  . Marland Kitchenhronic diastolic CHF (congestive heart failure) [place]  . Diabetes mellitus   . Gout   . Hyperlipidemia   . Hypertension   . Hypertriglyceridemia   . Pancreatitis    a. 09/2016 - Triglycerides 1,392 on admission.    Past Surgical History:  Procedure Laterality Date  . CHOLECYSTECTOMY N/A 01/04/2014   Procedure: LAPAROSCOPIC CHOLECYSTECTOMY WITH INTRAOPERATIVE CHOLANGIOGRAM;  Surgeon: ArRalene OkMD;  [facility]: [place];  Laterality: N/A;  . LUNG SURGERY      Current Medications: Outpatient Medications Prior to Visit  Medication Sig Dispense Refill  . acetaminophen (TYLENOL) 325 MG tablet Take 2 tablets (650 mg total) by  mouth every 6 (six) hours as needed for mild pain (or Fever >/= 101).    Marland Kitchen apixaban (ELIQUIS) 5 MG TABS tablet Take 1 tablet (5 mg total) by mouth 2 (two) times daily. 60 tablet 11  . aspirin EC 81 MG tablet Take 1 tablet (81 mg total) by mouth daily. 30 tablet 0  . Blood Glucose Monitoring Suppl (TRUE METRIX METER) w/Device KIT Use to monitor blood sugar as directed 1 kit 0  . fenofibrate 160 MG tablet Take 1 tablet (160 mg total) by mouth daily. 30 tablet 11  . glucose blood (TRUE METRIX BLOOD GLUCOSE TEST) test strip Use  as instructed to check blood sugars 3 times per day 100 each 11  . insulin aspart protamine- aspart (NOVOLOG MIX 70/30) (70-30) 100 UNIT/ML injection Inject 0.14 mLs (14 Units total) into the skin 2 (two) times daily with a meal. 10 mL 3  . Insulin Glargine (LANTUS SOLOSTAR) 100 UNIT/ML Solostar Pen Inject 15 units once per day 5 pen 11  . Insulin Syringe 27G X 1/2" 0.5 ML MISC 1 application by Does not apply route 2 (two) times daily. 100 each 2  . metFORMIN (GLUCOPHAGE) 500 MG tablet Take 1 tablet (500 mg total) by mouth 2 (two) times daily with a meal. 60 tablet 0  . Multiple Vitamins-Minerals (CENTRUM SILVER PO) Take 1 tablet by mouth daily.    . mupirocin ointment (BACTROBAN) 2 % Place 1 application into the nose 2 (two) times daily. (Patient taking differently: Place 1 application into the nose 2 (two) times daily as needed. ) 22 g 0  . omega-3 acid ethyl esters (LOVAZA) 1 g capsule Take 2 capsules (2 g total) by mouth 2 (two) times daily. 120 capsule 0  . pantoprazole (PROTONIX) 40 MG tablet Take 1 tablet (40 mg total) by mouth daily. 30 tablet 11  . TRUEPLUS LANCETS 28G MISC Use to check blood sugar 3 times per day 100 each 11  . atorvastatin (LIPITOR) 10 MG tablet Take 1 tablet (10 mg total) by mouth daily. 30 tablet 11  . metoprolol tartrate (LOPRESSOR) 25 MG tablet Take 1 tablet (25 mg total) by mouth 2 (two) times daily. 60 tablet 11   No facility-administered medications prior to visit.      Allergies:   Patient has no known allergies.   Social History   Socioeconomic History  . Marital status: Single    Spouse name: Not on file  . Number of children: Not on file  . Years of education: Not on file  . Highest education level: Not on file  Occupational History  . Not on file  Social Needs  . Financial resource strain: Not on file  . Food insecurity:    Worry: Not on file    Inability: Not on file  . Transportation needs:    Medical: Not on file    Non-medical: Not on  file  Tobacco Use  . Smoking status: Never Smoker  . Smokeless tobacco: Never Used  Substance and Sexual Activity  . Alcohol use: No  . Drug use: No  . Sexual activity: Not Currently  Lifestyle  . Physical activity:    Days per week: Not on file    Minutes per session: Not on file  . Stress: Not on file  Relationships  . Social connections:    Talks on phone: Not on file    Gets together: Not on file    Attends religious service: Not on file  Active member of club or organization: Not on file    Attends meetings of clubs or organizations: Not on file    Relationship status: Not on file  Other Topics Concern  . Not on file  Social History Narrative   Lives with her sister and does not need any assistance with ADLs.       Family History:  The patient's family history includes Diabetes in her sister; Heart attack in her father and mother.   ROS:   Please see the history of present illness.    ROS All other systems reviewed and are negative.   PHYSICAL EXAM:   VS:  BP 134/83   Pulse 73   Ht 5' 3" (1.6 m)   Wt 151 lb (68.5 kg)   BMI 26.75 kg/m    GEN: Well nourished, well developed, in no acute distress  HEENT: normal  Neck: no JVD, carotid bruits, or masses Cardiac: RRR; no murmurs, rubs, or gallops,no edema  Respiratory:  clear to auscultation bilaterally, normal work of breathing GI: soft, nontender, nondistended, + BS MS: no deformity or atrophy  Skin: warm and dry, no rash Neuro:  Alert and Oriented x 3, Strength and sensation are intact Psych: euthymic mood, full affect  Wt Readings from Last 3 Encounters:  03/09/18 151 lb (68.5 kg)  03/07/18 148 lb 3.2 oz (67.2 kg)  02/22/18 148 lb 3.2 oz (67.2 kg)      Studies/Labs Reviewed:   EKG:  EKG is ordered today.  The ekg ordered today demonstrates normal sinus rhythm with PAC  Recent Labs: 02/22/2018: ALT 10 03/07/2018: BUN 30; Creatinine, Ser 0.99; Hemoglobin 9.3; Magnesium 1.4; Platelets 251; Potassium  4.1; Sodium 135   Lipid Panel    Component Value Date/Time   CHOL 343 (H) 05/04/2017 0842   TRIG 816 (HH) 05/04/2017 0842   HDL 41 05/04/2017 0842   CHOLHDL 8.4 (H) 05/04/2017 0842   CHOLHDL 8.8 (H) 11/21/2016 0822   VLDL NOT CALC 11/21/2016 0822   LDLCALC Comment 05/04/2017 0842   LDLDIRECT 211 (H) 05/04/2017 0842   LDLDIRECT 170 (H) 11/21/2016 2800    Additional studies/ records that were reviewed today include:   Echo 10/04/2016 LV EF: 65% -   70% Study Conclusions  - Left ventricle: The cavity size was normal. Wall thickness was   normal. Systolic function was vigorous. The estimated ejection   fraction was in the range of 65% to 70%. Wall motion was normal;   there were no regional wall motion abnormalities. Features are   consistent with a pseudonormal left ventricular filling pattern,   with concomitant abnormal relaxation and increased filling   pressure (grade 2 diastolic dysfunction). - Aortic valve: Mildly calcified annulus. Trileaflet; mildly   calcified leaflets. - Mitral valve: There was mild regurgitation. - Right atrium: Central venous pressure (est): 3 mm Hg. - Atrial septum: A patent foramen ovale cannot be excluded. - Tricuspid valve: There was trivial regurgitation. - Pulmonary arteries: PA peak pressure: 30 mm Hg (S). - Pericardium, extracardiac: There was no pericardial effusion.  Impressions:  - Normal LV wall thickness with LVEF 65-70% and grade 2 diastolic   dysfunction. Mild mitral regurgitation. Mildly calcified aortic   valve.Trivial tricuspid regurgitation with PASP 30 mmHg. Cannot   exclude PFO, could consider saline contrast evaluation if   clinical question remains.    ASSESSMENT:    1. PAF (paroxysmal atrial fibrillation) ([place])   2. Type 2 diabetes mellitus with hyperglycemia, with long-term current use  of insulin ([place])   3. Mixed hyperlipidemia   4. Medication management   5. Essential hypertension      PLAN:  In order of  problems listed above:  1. Paroxysmal atrial fibrillation: CHA2DS2-Vasc score 3 (female, HTN, DM II).  Will increase metoprolol to 37.5 mg twice daily for better rate control.  She has been compliant with Eliquis since able to obtain it through [facility].  No further problem to acquire Eliquis by this point.  She recently underwent cardioversion in the emergency room.  She also had a recent recurrence of atrial fibrillation in the setting of uncontrolled hyperglycemia after running out of insulin.  2. Hypertension: Blood pressure stable, increase metoprolol tartrate to is 37.5 mg twice daily for better rate control  3. Hyperlipidemia: Cholesterol uncontrolled on the last lab work in November 2018, increase Lipitor to 40 mg daily.  Obtain fasting lipid panel and LFT in 6 to 8 weeks  4. DM2: Managed by primary care provider.  She was admitted in the hospital in August 2019 for uncontrolled hyperglycemia after running out of insulin.   Medication Adjustments/Labs and Tests Ordered: Current medicines are reviewed at length with the patient today.  Concerns regarding medicines are outlined above.  Medication changes, Labs and Tests ordered today are listed in the Patient Instructions below. Patient Instructions  Medication Instructions:  INCREASE- Atorvastatin 40 mg daily INCREASE- Metoprolol Tartrate 37.5 mg (1 1/2 tablets) twice a day  If you need a refill on your cardiac medications before your next appointment, please call your pharmacy.  Labwork: Fasting Lipids and Liver HERE IN OUR OFFICE AT [facility]  Take the provided lab slips with you to the lab for your blood draw.   You will need to fast. DO NOT EAT OR DRINK PAST MIDNIGHT.   Testing/Procedures: None Ordered  Follow-Up: Your physician wants you to follow-up in: 2-3 Months with Dr Debara Pickett.      Thank you for choosing [facility] at [employer] [place]  03/09/2018 8:59 AM    [address], [place], [place]  [address] Phone: [phone]; Fax: [phone]

## 2018-03-14 ENCOUNTER — Encounter (HOSPITAL_COMMUNITY): Payer: Self-pay | Admitting: Oncology

## 2018-03-14 ENCOUNTER — Emergency Department (HOSPITAL_COMMUNITY): Payer: Self-pay

## 2018-03-14 ENCOUNTER — Emergency Department (HOSPITAL_COMMUNITY)
Admission: EM | Admit: 2018-03-14 | Discharge: 2018-03-14 | Disposition: A | Discharge status: Home / Self Care | Payer: Self-pay | Attending: Emergency Medicine | Admitting: Emergency Medicine

## 2018-03-14 DIAGNOSIS — E119 Type 2 diabetes mellitus without complications: Secondary | ICD-10-CM | POA: Insufficient documentation

## 2018-03-14 DIAGNOSIS — Z7901 Long term (current) use of anticoagulants: Secondary | ICD-10-CM | POA: Insufficient documentation

## 2018-03-14 DIAGNOSIS — I5032 Chronic diastolic (congestive) heart failure: Secondary | ICD-10-CM | POA: Insufficient documentation

## 2018-03-14 DIAGNOSIS — Z7982 Long term (current) use of aspirin: Secondary | ICD-10-CM | POA: Insufficient documentation

## 2018-03-14 DIAGNOSIS — Z794 Long term (current) use of insulin: Secondary | ICD-10-CM | POA: Insufficient documentation

## 2018-03-14 DIAGNOSIS — K2980 Duodenitis without bleeding: Secondary | ICD-10-CM | POA: Insufficient documentation

## 2018-03-14 DIAGNOSIS — Z79899 Other long term (current) drug therapy: Secondary | ICD-10-CM | POA: Insufficient documentation

## 2018-03-14 DIAGNOSIS — I110 Hypertensive heart disease with heart failure: Secondary | ICD-10-CM | POA: Insufficient documentation

## 2018-03-14 LAB — CBC WITH DIFFERENTIAL/PLATELET
Abs Immature Granulocytes: 0 10*3/uL (ref 0.0–0.1)
BASOS ABS: 0 10*3/uL (ref 0.0–0.1)
Basophils Relative: 0 %
Eosinophils Absolute: 0.1 10*3/uL (ref 0.0–0.7)
Eosinophils Relative: 2 %
HEMATOCRIT: 28.1 % — AB (ref 36.0–46.0)
HEMOGLOBIN: 8.9 g/dL — AB (ref 12.0–15.0)
Immature Granulocytes: 0 %
LYMPHS ABS: 1.4 10*3/uL (ref 0.7–4.0)
Lymphocytes Relative: 18 %
MCH: 27.6 pg (ref 26.0–34.0)
MCHC: 31.7 g/dL (ref 30.0–36.0)
MCV: 87.3 fL (ref 78.0–100.0)
MONO ABS: 0.6 10*3/uL (ref 0.1–1.0)
Monocytes Relative: 8 %
NEUTROS ABS: 5.6 10*3/uL (ref 1.7–7.7)
Neutrophils Relative %: 72 %
Platelets: 268 10*3/uL (ref 150–400)
RBC: 3.22 MIL/uL — AB (ref 3.87–5.11)
RDW: 13.2 % (ref 11.5–15.5)
WBC: 7.8 10*3/uL (ref 4.0–10.5)

## 2018-03-14 LAB — COMPREHENSIVE METABOLIC PANEL
ALT: 11 U/L (ref 0–44)
AST: 13 U/L — AB (ref 15–41)
Albumin: 3.4 g/dL — ABNORMAL LOW (ref 3.5–5.0)
Alkaline Phosphatase: 36 U/L — ABNORMAL LOW (ref 38–126)
Anion gap: 9 (ref 5–15)
BUN: 33 mg/dL — ABNORMAL HIGH (ref 6–20)
CHLORIDE: 100 mmol/L (ref 98–111)
CO2: 25 mmol/L (ref 22–32)
Calcium: 9.7 mg/dL (ref 8.9–10.3)
Creatinine, Ser: 0.84 mg/dL (ref 0.44–1.00)
GFR calc non Af Amer: >60 mL/min (ref >60)
Glucose, Bld: 252 mg/dL — ABNORMAL HIGH (ref 70–99)
Potassium: 3.8 mmol/L (ref 3.5–5.1)
SODIUM: 134 mmol/L — AB (ref 135–145)
Total Bilirubin: 0.6 mg/dL (ref 0.3–1.2)
Total Protein: 6.1 g/dL — ABNORMAL LOW (ref 6.5–8.1)

## 2018-03-14 LAB — I-STAT TROPONIN, ED: TROPONIN I, POC: 0 ng/mL (ref 0.00–0.08)

## 2018-03-14 LAB — LIPASE, BLOOD: Lipase: 38 U/L (ref 11–51)

## 2018-03-14 MED ORDER — MORPHINE SULFATE (PF) 4 MG/ML IV SOLN
4.0000 mg | Freq: Once | INTRAVENOUS | Status: AC
  Administered 2018-03-14: 4 mg via INTRAVENOUS
  Filled 2018-03-14: qty 1

## 2018-03-14 MED ORDER — PANTOPRAZOLE SODIUM 20 MG PO TBEC
20.0000 mg | DELAYED_RELEASE_TABLET | Freq: Once | ORAL | Status: AC
  Administered 2018-03-14: 20 mg via ORAL
  Filled 2018-03-14 (×2): qty 1

## 2018-03-14 MED ORDER — FAMOTIDINE 20 MG PO TABS
20.0000 mg | ORAL_TABLET | Freq: Once | ORAL | Status: AC
  Administered 2018-03-14: 20 mg via ORAL
  Filled 2018-03-14: qty 1

## 2018-03-14 MED ORDER — DICYCLOMINE HCL 20 MG PO TABS: 20.0000 mg | ORAL_TABLET | Freq: Two times a day (BID) | ORAL | 0 refills | Status: DC

## 2018-03-14 MED ORDER — FAMOTIDINE 20 MG PO TABS: 20.0000 mg | ORAL_TABLET | Freq: Two times a day (BID) | ORAL | 0 refills | Status: DC

## 2018-03-14 MED ORDER — ONDANSETRON HCL 4 MG/2ML IJ SOLN
4.0000 mg | Freq: Once | INTRAMUSCULAR | Status: AC
  Administered 2018-03-14: 4 mg via INTRAVENOUS
  Filled 2018-03-14: qty 2

## 2018-03-14 MED ORDER — ONDANSETRON 4 MG PO TBDP: 4.0000 mg | ORAL_TABLET | Freq: Three times a day (TID) | ORAL | 0 refills | Status: DC | PRN

## 2018-03-14 MED ORDER — IOHEXOL 300 MG/ML  SOLN
100.0000 mL | Freq: Once | INTRAMUSCULAR | Status: AC | PRN
  Administered 2018-03-14: 100 mL via INTRAVENOUS

## 2018-03-14 MED ORDER — SODIUM CHLORIDE 0.9 % IV BOLUS
250.0000 mL | Freq: Once | INTRAVENOUS | Status: AC
  Administered 2018-03-14: 250 mL via INTRAVENOUS

## 2018-03-14 NOTE — ED Provider Notes (Signed)
[place] EMERGENCY DEPARTMENT Provider Note   CSN: [id number] Arrival date & time: 03/14/18  5188     History   Chief Complaint Chief Complaint  Patient presents with  . Abdominal Pain    HPI Emily Farmer is a 62 y.o. female.  HPI   Emily Farmer is a 62 y.o. female, with a history of A. fib, chronic diastolic CHF with EF of 65 to 70%, DM, HTN, hypertriglyceridemia, and pancreatitis, presenting to the ED with abdominal pain beginning around 5 AM this morning.  Pain is largely epigastric, sharp, 9/10, waxing and waning, radiating throughout the upper abdomen.  She also endorses intermittent chest pain stating, "I think I might have pulled something in my chest when I was vomiting."  States her symptoms are similar to those she experienced with pancreatitis last year. Denies alcohol or regular NSAID use. Last bowel movement was earlier this morning and was normal.  Denies fever/chills, diarrhea, hematochezia/melena, shortness of breath, urinary symptoms, lower extremity edema or pain, palpitations, or any other complaints.    Past Medical History:  Diagnosis Date  . Arthritis   . Atrial fibrillation ([place])    a. 09/2016 in setting of pancreatitis;  b. 09/2016 Echo: EF 65-60%, no rwma, Gr2 DD, mild MR, triv TR, PASP 59mHg;  CHA2DS2VASc = 4-->Eliquis 556mBID.  . Marland Kitchenhronic diastolic CHF (congestive heart failure) (HCMedina8/15/2019  . Diabetes mellitus   . Gout   . Hyperlipidemia   . Hypertension   . Hypertriglyceridemia   . Pancreatitis    a. 09/2016 - Triglycerides 1,392 on admission.    Patient Active Problem List   Diagnosis Date Noted  . Hyperosmolar non-ketotic state in patient with type 2 diabetes mellitus (HC[facility]08/16/2019  . Hyperglycemic hyperosmolar nonketotic coma (HC[facility]ceton Mills08/15/2019  . Atrial fibrillation with RVR (HC[facility]08/15/2019  . Non-compliance 01/28/2018  . Hyponatremia 01/28/2018  . Normocytic anemia 01/28/2018  . Chronic diastolic CHF  (congestive heart failure) (HCLake Wales08/15/2019  . Labial abscess 01/28/2018  . Orthostatic hypotension 01/28/2018  . Mixed hyperlipidemia 06/01/2017  . Acute pancreatitis 10/04/2016  . Paroxysmal A-fib (HCBel[facility]04/21/2018  . Cholecystitis, acute with cholelithiasis 01/04/2014  . Hypokalemia 01/04/2014  . Pyelonephritis 02/15/2013  . Dehydration 02/15/2013  . Hyperglycemia 02/15/2013  . Increased anion gap metabolic acidosis 0941/66/0630. Essential hypertension 02/15/2013  . Hypertriglyceridemia 02/15/2013  . Diabetes mellitus type 2 in obese (HCFox River09/07/2012  . Coronary atherosclerosis seen on CT 02/15/2013  . Nausea and vomiting 02/15/2013  . Diarrhea 02/15/2013    Past Surgical History:  Procedure Laterality Date  . CHOLECYSTECTOMY N/A 01/04/2014   Procedure: LAPAROSCOPIC CHOLECYSTECTOMY WITH INTRAOPERATIVE CHOLANGIOGRAM;  Surgeon: ArRalene OkMD;  [facility]: General;  Laterality: N/A;  . LUNG SURGERY       OB History   None      Home Medications    Prior to Admission medications   Medication Sig Start Date End Date Taking? Authorizing Provider  acetaminophen (TYLENOL) 325 MG tablet Take 2 tablets (650 mg total) by mouth every 6 (six) hours as needed for mild pain (or Fever >/= 101). 02/01/18  Yes Elgergawy, DaSilver HugueninMD  apixaban (ELIQUIS) 5 MG TABS tablet Take 1 tablet (5 mg total) by mouth 2 (two) times daily. 02/22/18  Yes Fulp, Cammie, MD  aspirin EC 81 MG tablet Take 1 tablet (81 mg total) by mouth daily. 02/01/18  Yes Elgergawy, DaSilver HugueninMD  atorvastatin (LIPITOR) 40 MG tablet Take 1  tablet (40 mg total) by mouth daily. 03/09/18  Yes Almyra Deforest, PA  fenofibrate 160 MG tablet Take 1 tablet (160 mg total) by mouth daily. 02/22/18  Yes Fulp, Cammie, MD  hydroxypropyl methylcellulose / hypromellose (ISOPTO TEARS / GONIOVISC) 2.5 % ophthalmic solution Place 1 drop into both eyes as needed for dry eyes.   Yes [provider]  insulin aspart protamine-  aspart (NOVOLOG MIX 70/30) (70-30) 100 UNIT/ML injection Inject 0.14 mLs (14 Units total) into the skin 2 (two) times daily with a meal. 02/01/18  Yes Elgergawy, Silver Huguenin, MD  Insulin Glargine (LANTUS SOLOSTAR) 100 UNIT/ML Solostar Pen Inject 15 units once per day Patient taking differently: Inject 12 Units into the skin at bedtime.  02/22/18  Yes Fulp, Cammie, MD  metFORMIN (GLUCOPHAGE) 500 MG tablet Take 1 tablet (500 mg total) by mouth 2 (two) times daily with a meal. 02/01/18  Yes Elgergawy, Silver Huguenin, MD  metoprolol tartrate (LOPRESSOR) 25 MG tablet Take 1.5 tablets (37.5 mg total) by mouth 2 (two) times daily. 03/09/18  Yes Almyra Deforest, PA  Multiple Vitamins-Minerals (CENTRUM SILVER PO) Take 1 tablet by mouth daily.   Yes [provider]  mupirocin ointment (BACTROBAN) 2 % Place 1 application into the nose 2 (two) times daily. Patient taking differently: Place 1 application into the nose 2 (two) times daily as needed.  02/01/18  Yes Elgergawy, Silver Huguenin, MD  omega-3 acid ethyl esters (LOVAZA) 1 g capsule Take 2 capsules (2 g total) by mouth 2 (two) times daily. 02/01/18  Yes Elgergawy, Silver Huguenin, MD  pantoprazole (PROTONIX) 40 MG tablet Take 1 tablet (40 mg total) by mouth daily. 02/22/18  Yes Fulp, Cammie, MD  Blood Glucose Monitoring Suppl (TRUE METRIX METER) w/Device KIT Use to monitor blood sugar as directed 02/22/18   Fulp, Cammie, MD  dicyclomine (BENTYL) 20 MG tablet Take 1 tablet (20 mg total) by mouth 2 (two) times daily. 03/14/18   Marquel Spoto C, PA-C  famotidine (PEPCID) 20 MG tablet Take 1 tablet (20 mg total) by mouth 2 (two) times daily for 5 days. 03/14/18 03/19/18  Kriss Ishler C, PA-C  glucose blood (TRUE METRIX BLOOD GLUCOSE TEST) test strip Use as instructed to check blood sugars 3 times per day 02/22/18   Fulp, Cammie, MD  Insulin Syringe 27G X 1/2" 0.5 ML MISC 1 application by Does not apply route 2 (two) times daily. 02/01/18   Elgergawy, Silver Huguenin, MD  ondansetron (ZOFRAN ODT) 4 MG  disintegrating tablet Take 1 tablet (4 mg total) by mouth every 8 (eight) hours as needed for nausea or vomiting. 03/14/18   Ronal Maybury C, PA-C  TRUEPLUS LANCETS 28G MISC Use to check blood sugar 3 times per day 02/22/18   Antony Blackbird, MD    Family History Family History  Problem Relation Age of Onset  . Heart attack Mother   . Heart attack Father   . Diabetes Sister   . High blood pressure Neg Hx   . High Cholesterol Neg Hx     Social History Social History   Tobacco Use  . Smoking status: Never Smoker  . Smokeless tobacco: Never Used  Substance Use Topics  . Alcohol use: No  . Drug use: No     Allergies   Patient has no known allergies.   Review of Systems Review of Systems  Constitutional: Negative for chills, diaphoresis and fever.  Respiratory: Negative for shortness of breath.   Cardiovascular: Negative for palpitations and leg  swelling.  Gastrointestinal: Positive for abdominal pain, nausea and vomiting. Negative for blood in stool and diarrhea.  Genitourinary: Negative for dysuria, flank pain and frequency.  Musculoskeletal: Negative for back pain.  Neurological: Negative for weakness and numbness.  All other systems reviewed and are negative.    Physical Exam Updated Vital Signs BP 122/67 (BP Location: Right Arm)   Pulse 71   Temp 98 F (36.7 C) (Oral)   Resp 16   Ht '5\' 3"'  (1.6 m)   Wt 72.6 kg   SpO2 100%   BMI 28.34 kg/m   Physical Exam  Constitutional: She appears well-developed and well-nourished. No distress.  HENT:  Head: Normocephalic and atraumatic.  Eyes: Conjunctivae are normal.  Neck: Neck supple.  Cardiovascular: Normal rate, regular rhythm, normal heart sounds and intact distal pulses.  Pulmonary/Chest: Effort normal and breath sounds normal. No respiratory distress.  Abdominal: Soft. Bowel sounds are normal. There is tenderness in the right upper quadrant and epigastric area. There is no guarding.  Musculoskeletal: She exhibits no  edema.  Lymphadenopathy:    She has no cervical adenopathy.  Neurological: She is alert.  Skin: Skin is warm and dry. She is not diaphoretic.  Psychiatric: She has a normal mood and affect. Her behavior is normal.  Nursing note and vitals reviewed.    ED Treatments / Results  Labs (all labs ordered are listed, but only abnormal results are displayed) Labs Reviewed  COMPREHENSIVE METABOLIC PANEL - Abnormal; Notable for the following components:      Result Value   Sodium 134 (*)    Glucose, Bld 252 (*)    BUN 33 (*)    Total Protein 6.1 (*)    Albumin 3.4 (*)    AST 13 (*)    Alkaline Phosphatase 36 (*)    All other components within normal limits  CBC WITH DIFFERENTIAL/PLATELET - Abnormal; Notable for the following components:   RBC 3.22 (*)    Hemoglobin 8.9 (*)    HCT 28.1 (*)    All other components within normal limits  LIPASE, BLOOD  URINALYSIS, ROUTINE W REFLEX MICROSCOPIC  I-STAT TROPONIN, ED   Hemoglobin  Date Value Ref Range Status  03/14/2018 8.9 (L) 12.0 - 15.0 g/dL Final  03/07/2018 9.3 (L) 12.0 - 15.0 g/dL Final  02/22/2018 10.2 (L) 11.1 - 15.9 g/dL Final  01/30/2018 9.0 (L) 12.0 - 15.0 g/dL Final  01/29/2018 9.6 (L) 12.0 - 15.0 g/dL Final    EKG EKG Interpretation  Date/Time:  Sunday March 14 2018 06:48:36 EDT Ventricular Rate:  68 PR Interval:    QRS Duration: 74 QT Interval:  398 QTC Calculation: 424 R Axis:   52 Text Interpretation:  Sinus rhythm Ventricular premature complex Borderline T wave abnormalities Baseline wander in lead(s) I pvc resolved Otherwise no significant change Confirmed by Deno Etienne [phone]) on 03/14/2018 8:35:45 AM Also confirmed by Deno Etienne [phone]), editor Philomena Doheny [phone])  on 03/14/2018 10:00:21 AM   Radiology Ct Abdomen Pelvis W Contrast  Result Date: 03/14/2018 CLINICAL DATA:  RIGHT lower quadrant abdominal pain. Nausea and vomiting for 2 hours. EXAM: CT ABDOMEN AND PELVIS WITH CONTRAST TECHNIQUE:  Multidetector CT imaging of the abdomen and pelvis was performed using the standard protocol following bolus administration of intravenous contrast. CONTRAST:  140m OMNIPAQUE IOHEXOL 300 MG/ML  SOLN COMPARISON:  CT 10/04/2016 FINDINGS: Lower chest: Band of linear atelectasis at the RIGHT lung base along the fissure with linear calcifications. Hepatobiliary: No focal hepatic lesion. Postcholecystectomy.  Very mild biliary duct dilatation. Common bile duct normal caliber. Pancreas: No clear pancreatic inflammation. No pancreatic duct dilatation. Body and tail the pancreas are normal. Insert mild inflammation adjacent to the head of the pancreas is favored to relate to the duodenal edema rather than pancreatic inflammation. Spleen: Normal spleen Adrenals/urinary tract: Adrenal glands and kidneys are normal. The ureters and bladder normal. Stomach/Bowel: Stomach is normal. There is edema surrounding the bulb of the duodenum. Mild mucosal enhancement through this region. No evidence of perforation or abscess. The second and third portion duodenum appear normal. The small bowel the terminal ileum are normal. Appendix not identified. Secondary signs of appendicitis. Ascending colon is normal. Transverse colon enters and exits a umbilical hernia. Approximately 10 cm transverse colon enters and exits the umbilical hernia. No obstruction. The descending colon is normal. Rectum normal. Vascular/Lymphatic: Abdominal aorta is normal caliber. No periportal or retroperitoneal adenopathy. No pelvic adenopathy. Reproductive: Uterus normal.  Ovary small.  No adnexal abnormality Other: Round lesion with peripheral calcification in the LEFT lower quadrant anteriorly measuring 2 cm (image 69/3). This benign appearing lesion is not changed from CT exam 2014. Musculoskeletal: Degenerate changes the spine. No aggressive osseous lesion. IMPRESSION: 1. Edema of the first portion the duodenum consistent with duodenitis. No perforation or  abscess. Edema along the second portion of the duodenum was noted on CT 10/04/2016. 2. No evidence of pancreatitis. 3. Appendix not identified but no secondary signs of appendicitis. Electronically Signed   By: Suzy Bouchard M.D.   On: 03/14/2018 09:18    Procedures Procedures (including critical care time)  Medications Ordered in ED Medications  sodium chloride 0.9 % bolus 250 mL (0 mLs Intravenous Stopped 03/14/18 0755)  morphine 4 MG/ML injection 4 mg (4 mg Intravenous Given 03/14/18 0650)  ondansetron (ZOFRAN) injection 4 mg (4 mg Intravenous Given 03/14/18 0650)  iohexol (OMNIPAQUE) 300 MG/ML solution 100 mL (100 mLs Intravenous Contrast Given 03/14/18 0818)  pantoprazole (PROTONIX) EC tablet 20 mg (20 mg Oral Given 03/14/18 1028)  famotidine (PEPCID) tablet 20 mg (20 mg Oral Given 03/14/18 1028)     Initial Impression / Assessment and Plan / ED Course  I have reviewed the triage vital signs and the nursing notes.  Pertinent labs & imaging results that were available during my care of the patient were reviewed by me and considered in my medical decision making (see chart for details).  Clinical Course as of Mar 15 1051  Sun Mar 14, 2018  0727 Seems to be consistent with patient's spread of previous values.  Hemoglobin(!): 8.9 [SJ]  0743 Consistent with previous values.  BUN(!): 33 [SJ]  0940 Patient states she feels much better.  She has not had an episode of vomiting since receiving the Zofran.  Pain is well controlled.   [SJ]    Clinical Course User Index [SJ] Siera Beyersdorf C, PA-C    Patient presents with abdominal pain.  Patient is nontoxic appearing, afebrile, not tachycardic, not tachypneic, not hypotensive, maintains excellent SPO2 on room air, and is in no apparent distress.  No leukocytosis.  No vomiting during ED course.  Evidence of duodenitis on CT.  Gastroenterology follow-up.  Tolerating PO discharge. The patient was given instructions for home care as well as return  precautions. Patient voices understanding of these instructions, accepts the plan, and is comfortable with discharge.   Vitals:   03/14/18 0614 03/14/18 0616 03/14/18 0618  BP: (!) 117/57 122/67   Pulse: 71 71   Resp: 20  16   Temp: 97.8 F (36.6 C) 98 F (36.7 C)   TempSrc: Oral Oral   SpO2: 100% 100%   Weight:   72.6 kg  Height:   '5\' 3"'  (1.6 m)   Vitals:   03/14/18 0815 03/14/18 0900 03/14/18 0930 03/14/18 1000  BP: (!) 103/58 113/62 115/60 121/71  Pulse: 66 68 67 67  Resp: '17 12 14 14  ' Temp:      TempSrc:      SpO2: 99% 100% 97% 100%  Weight:      Height:         Final Clinical Impressions(s) / ED Diagnoses   Final diagnoses:  Duodenitis    ED Discharge Orders         Ordered    famotidine (PEPCID) 20 MG tablet  2 times daily     03/14/18 1011    ondansetron (ZOFRAN ODT) 4 MG disintegrating tablet  Every 8 hours PRN     03/14/18 1011    dicyclomine (BENTYL) 20 MG tablet  2 times daily     03/14/18 [place], [place], PA-C 03/14/18 1054    Ward, Delice Bison, DO 03/14/18 2313

## 2018-03-14 NOTE — ED Notes (Signed)
Patient transported to CT 

## 2018-03-14 NOTE — Discharge Instructions (Addendum)
Gastritis/Duodenitis  Diet: Start with a clear liquid diet, progressed to a full liquid diet, and then bland solids as you are able. Please adhere to the enclosed dietary suggestions.  In general, avoid NSAIDs (i.e. ibuprofen, naproxen, etc.), caffeine, alcohol, spicy foods, fatty foods, or any other foods that seem to cause your symptoms to arise.  Protonix: Continue taking this medication daily, 20-30 minutes prior to your first meal, for at least the next 8 weeks.  Continue to take this medication even if you begin to feel better.  Pepcid: Take this medication twice a day for the next 5 days.  Zofran: May take Zofran, as needed, for nausea/vomiting.  Bentyl: May use the Bentyl, as needed for abdominal discomfort.  Bismuth: May try using this medication for improvement of symptoms. This medication is found in brand name products such as Pepto-Bismol, but is also available in its generic form.  Follow-up: Please follow-up with gastroenterology on this matter. You may have an infection with a bacteria called H. Pylori, however this would need to be confirmed with an endoscopy prior to treatment.  Return: Return to the ED for significantly worsening symptoms, persistent vomiting, persistent fever, vomiting blood, blood in the stools, dark stools, or any other major concerns.  For prescription assistance, may try using prescription discount sites or apps, such as goodrx.com

## 2018-03-14 NOTE — ED Triage Notes (Signed)
Pt bib [facility] from home d/t RLQ abdominal pain, n/v x 2 hours.  Pt given 4 mg zofran en route.

## 2018-03-15 MED FILL — FAMOTIDINE 20 MG TABLET: 20 | 5 days supply | Qty: 10 | Fill #0

## 2018-03-15 MED FILL — DICYCLOMINE HCL 20 MG TABS: 20 | 10 days supply | Qty: 20 | Fill #0

## 2018-03-15 MED FILL — ONDANSETRON ODT 4 MG TABLET: 4 | 6 days supply | Qty: 20 | Fill #0

## 2018-03-22 ENCOUNTER — Encounter: Payer: Self-pay | Admitting: Pharmacist

## 2018-03-22 ENCOUNTER — Ambulatory Visit: Payer: Self-pay | Attending: Family Medicine | Admitting: Pharmacist

## 2018-03-22 DIAGNOSIS — Z833 Family history of diabetes mellitus: Secondary | ICD-10-CM | POA: Insufficient documentation

## 2018-03-22 DIAGNOSIS — Z794 Long term (current) use of insulin: Secondary | ICD-10-CM

## 2018-03-22 DIAGNOSIS — E1165 Type 2 diabetes mellitus with hyperglycemia: Secondary | ICD-10-CM

## 2018-03-22 DIAGNOSIS — Z8719 Personal history of other diseases of the digestive system: Secondary | ICD-10-CM | POA: Insufficient documentation

## 2018-03-22 DIAGNOSIS — Z9119 Patient's noncompliance with other medical treatment and regimen: Secondary | ICD-10-CM | POA: Insufficient documentation

## 2018-03-22 DIAGNOSIS — E1140 Type 2 diabetes mellitus with diabetic neuropathy, unspecified: Secondary | ICD-10-CM | POA: Insufficient documentation

## 2018-03-22 LAB — GLUCOSE, POCT (MANUAL RESULT ENTRY): POC Glucose: 174 mg/dL — AB (ref 70–99)

## 2018-03-22 MED ORDER — METFORMIN HCL 500 MG PO TABS: 1000.0000 mg | ORAL_TABLET | Freq: Two times a day (BID) | ORAL | 0 refills | Status: DC

## 2018-03-22 MED ORDER — INSULIN PEN NEEDLE 32G X 4 MM MISC: 11 refills | Status: DC

## 2018-03-22 MED FILL — TRUEplus LANCETS 28G MISC: 30 days supply | Qty: 100 | Fill #0

## 2018-03-22 MED FILL — metFORMIN HCL 500 MG TABS: 500 | 30 days supply | Qty: 120 | Fill #0

## 2018-03-22 MED FILL — !LANTUS SOLOSTAR 100UNITS/M: 100 | 40 days supply | Qty: 6 | Fill #0

## 2018-03-22 MED FILL — TRUE METRIX TEST STRIP: 30 days supply | Qty: 100 | Fill #0

## 2018-03-22 MED FILL — TRUEPLUS PEN NDL 32GX5/32": 32G X 4 MM | 30 days supply | Qty: 100 | Fill #0

## 2018-03-22 MED FILL — TRUEPLUS PEN NDL 32GX5/32: 32G X 4 MM | 30 days supply | Qty: 100 | Fill #0

## 2018-03-22 MED FILL — !TRUE METRIX BLOOD GLUCOSE: 30 days supply | Qty: 1 | Fill #0

## 2018-03-22 NOTE — Patient Instructions (Signed)
Thank you for coming to see me today. Please do the following:  1. Increase metformin to 2 tablets twice a day. 2. Start Lantus 15 units before bedtime.  3. Stop Novolog 70/30.  4. Continue checking blood sugars at home. Start by checking first thing in the morning before you've eaten.  5. Continue making the lifestyle changes we've discussed together during our visit. Diet and exercise play a significant role in improving your blood sugars.  6. Follow-up with me in 2 weeks.    Hypoglycemia or low blood sugar:   Low blood sugar can happen quickly and may become an emergency if not treated right away.   While this shouldn't happen often, it can be brought upon if you skip a meal or do not eat enough. Also, if your insulin or other diabetes medications are dosed too high, this can cause your blood sugar to go to low.   Warning signs of low blood sugar include: 1. Feeling shaky or dizzy 2. Feeling weak or tired  3. Excessive hunger 4. Feeling anxious or upset  5. Sweating even when you aren't exercising  What to do if I experience low blood sugar? 1. Check your blood sugar with your meter. If lower than 70, proceed to step 2.  2. Treat with 3-4 glucose tablets or 3 packets of regular sugar. If these aren't around, you can try hard candy. Yet another option would be to drink 4 ounces of fruit juice or 6 ounces of REGULAR soda.  3. Re-check your sugar in 15 minutes. If it is still below 70, do what you did in step 2 again. If has come back up, go ahead and eat a snack or small meal at this time.

## 2018-03-22 NOTE — Progress Notes (Signed)
S:    PCP: Dr. Jillyn Hidden  No chief complaint on file.  Patient arrives in good spirits. Presents for diabetes management at the request of Dr. Jillyn Hidden. Patient was referred on 02/22/18. Patient was last seen by PCP on 02/22/18. Lantus 15 units daily started at that time. Of note, patient reports medication non-adherence. Reports lack of insurance/financial means as barriers to care. Verified via [facility] that patient has not picked up medications (including Lantus and DM testing supplies) that were prescribed at 02/22/18 visit.   Family/Social History:  - FH: DM (sister) - Tobacco: denies - Alcohol: denies  Insurance coverage/medication affordability:  - PASS via [facility]  Patient denies adherence with medications.  Current diabetes medications include:  - metformin 500 mg BID - insulin glargine 15 units daily. Pt not taking. - insulin aspart protamine-aspart 70/30 14 BID. Pt still missing 1 dose/day frequently.  Patient reports hypoglycemic events. Reports symptoms of blurred vision. Successfully treats.   Patient reported dietary habits:  - Reports high intake of white carbs  - "Usually I eat spaghetti or a cheese steak sandwich"    Patient-reported exercise habits:  - Patient does not exercise   Patient denies nocturia.  Patient reports neuropathy. Patient reports visual changes. Patient reports self foot exams.   O:  POCT: 174 (fasting) Home glucose levels: patient does not check at home  Lab Results  Component Value Date   HGBA1C 17.2 (H) 01/28/2018   There were no vitals filed for this visit.  Lipid Panel     Component Value Date/Time   CHOL 343 (H) 05/04/2017 0842   TRIG 816 (HH) 05/04/2017 0842   HDL 41 05/04/2017 0842   CHOLHDL 8.4 (H) 05/04/2017 0842   CHOLHDL 8.8 (H) 11/21/2016 0822   VLDL NOT CALC 11/21/2016 0822   LDLCALC Comment 05/04/2017 0842   LDLDIRECT 211 (H) 05/04/2017 0842   LDLDIRECT 170 (H) 11/21/2016 1610    Clinical ASCVD: Yes    A/P: Diabetes longstanding currently uncontrolled. Patient is able to verbalize appropriate hypoglycemia management plan. Patient is not adherent with medication. Control is suboptimal due to non-adherence, dietary indiscretion, and physical inactivity.  Hard to make recommendations in a poorly compliant patient with no home levels to assess. However, patient is amenable to getting medications today and increasing metformin. She agrees to be more adherent with medications and BG testing at home. She is certainly indicated for combination injectable therapy, but I will wait to make adjustments once we have home levels. Will increase metformin for now and stop Novolog d/t hypo risk.   In the future, would recommend AGAINST TZD or DPP-IV inhibitor d/t heart failure hx. Patient may benefit from Victoza, but we will address this at future visit. Noted to have history of pancreatitis. With her symptoms and A1c, will likely use basal-bolus regimen.   -Continued basal insulin Lantus 15 units daily  -Counseled on proper injection technique -Discontinued  Novolog 70/30 -Increased dose of metformin to 1000 mg BID -Extensively discussed pathophysiology of DM, recommended lifestyle interventions, dietary effects on glycemic control -Counseled on s/sx of and management of hypoglycemia -Next A1C anticipated 04/2018.   ASCVD risk - secondary prevention in patient with DM + CAD. Lipid panel ordered today.- high intensity statin indicated. Aspirin is indicated.  -Continued aspirin 81 mg  -Continued aotrvastatin 40 mg.   Written patient instructions provided.  Total time in face to face counseling 30 minutes.   Follow up Pharmacist Clinic Visit in 2 weeks.  Patient  seen with:  Leanne Chang, PharmD Candidate [employer] of 2021  Butch Penny, PharmD, CPP Clinical Pharmacist [facility] [phone]

## 2018-03-23 LAB — LIPID PANEL
Chol/HDL Ratio: 3.8 ratio (ref 0.0–4.4)
Cholesterol, Total: 187 mg/dL (ref 100–199)
HDL: 49 mg/dL
LDL Calculated: 105 mg/dL — ABNORMAL HIGH (ref 0–99)
Triglycerides: 163 mg/dL — ABNORMAL HIGH (ref 0–149)
VLDL Cholesterol Cal: 33 mg/dL (ref 5–40)

## 2018-03-26 ENCOUNTER — Telehealth: Payer: Self-pay | Admitting: *Deleted

## 2018-03-26 NOTE — Telephone Encounter (Signed)
-----   Message from Cain Saupe, MD sent at 03/25/2018  4:15 PM EDT ----- Patient's triglycerides are improved from 816 in November of last year and now at 163. LDL is 105 but her goal is 70 or less so patient should continue her lipitor/atorvastatin as well as a low fat diet

## 2018-03-26 NOTE — Telephone Encounter (Signed)
Medical Assistant left message on patient's home and cell voicemail. Voicemail states to give a call back to [place] with [facility] at [phone]. Patient is aware of cholesterol levels being improved and to continue with atorvastatin and low fat diet.

## 2018-04-05 ENCOUNTER — Ambulatory Visit: Payer: Self-pay | Attending: Family Medicine | Admitting: Pharmacist

## 2018-04-05 ENCOUNTER — Encounter: Payer: Self-pay | Admitting: Family Medicine

## 2018-04-05 ENCOUNTER — Ambulatory Visit (HOSPITAL_BASED_OUTPATIENT_CLINIC_OR_DEPARTMENT_OTHER): Payer: Self-pay | Admitting: Family Medicine

## 2018-04-05 VITALS — BP 100/64 | HR 72 | Temp 97.8°F | Ht 63.0 in | Wt 145.0 lb

## 2018-04-05 DIAGNOSIS — Z79899 Other long term (current) drug therapy: Secondary | ICD-10-CM | POA: Insufficient documentation

## 2018-04-05 DIAGNOSIS — N3000 Acute cystitis without hematuria: Secondary | ICD-10-CM

## 2018-04-05 DIAGNOSIS — M791 Myalgia, unspecified site: Secondary | ICD-10-CM | POA: Insufficient documentation

## 2018-04-05 DIAGNOSIS — Z6825 Body mass index (BMI) 25.0-25.9, adult: Secondary | ICD-10-CM | POA: Insufficient documentation

## 2018-04-05 DIAGNOSIS — Z7982 Long term (current) use of aspirin: Secondary | ICD-10-CM | POA: Insufficient documentation

## 2018-04-05 DIAGNOSIS — E669 Obesity, unspecified: Secondary | ICD-10-CM | POA: Insufficient documentation

## 2018-04-05 DIAGNOSIS — E1169 Type 2 diabetes mellitus with other specified complication: Secondary | ICD-10-CM

## 2018-04-05 DIAGNOSIS — E119 Type 2 diabetes mellitus without complications: Secondary | ICD-10-CM | POA: Insufficient documentation

## 2018-04-05 DIAGNOSIS — I5032 Chronic diastolic (congestive) heart failure: Secondary | ICD-10-CM | POA: Insufficient documentation

## 2018-04-05 DIAGNOSIS — R112 Nausea with vomiting, unspecified: Secondary | ICD-10-CM

## 2018-04-05 DIAGNOSIS — R109 Unspecified abdominal pain: Secondary | ICD-10-CM | POA: Insufficient documentation

## 2018-04-05 DIAGNOSIS — E781 Pure hyperglyceridemia: Secondary | ICD-10-CM | POA: Insufficient documentation

## 2018-04-05 DIAGNOSIS — IMO0002 Reserved for concepts with insufficient information to code with codable children: Secondary | ICD-10-CM

## 2018-04-05 DIAGNOSIS — M1990 Unspecified osteoarthritis, unspecified site: Secondary | ICD-10-CM | POA: Insufficient documentation

## 2018-04-05 DIAGNOSIS — E118 Type 2 diabetes mellitus with unspecified complications: Secondary | ICD-10-CM

## 2018-04-05 DIAGNOSIS — I4891 Unspecified atrial fibrillation: Secondary | ICD-10-CM | POA: Insufficient documentation

## 2018-04-05 DIAGNOSIS — I110 Hypertensive heart disease with heart failure: Secondary | ICD-10-CM | POA: Insufficient documentation

## 2018-04-05 DIAGNOSIS — M109 Gout, unspecified: Secondary | ICD-10-CM | POA: Insufficient documentation

## 2018-04-05 DIAGNOSIS — E1165 Type 2 diabetes mellitus with hyperglycemia: Secondary | ICD-10-CM

## 2018-04-05 DIAGNOSIS — Z7901 Long term (current) use of anticoagulants: Secondary | ICD-10-CM | POA: Insufficient documentation

## 2018-04-05 DIAGNOSIS — Z794 Long term (current) use of insulin: Secondary | ICD-10-CM | POA: Insufficient documentation

## 2018-04-05 LAB — POCT URINALYSIS DIP (CLINITEK)
BILIRUBIN UA: NEGATIVE
BILIRUBIN UA: NEGATIVE mg/dL
Glucose, UA: NEGATIVE mg/dL
Nitrite, UA: NEGATIVE
RBC UA: NEGATIVE
SPEC GRAV UA: 1.025 (ref 1.010–1.025)
Urobilinogen, UA: 0.2 E.U./dL
pH, UA: 5.5 (ref 5.0–8.0)

## 2018-04-05 LAB — GLUCOSE, POCT (MANUAL RESULT ENTRY): POC Glucose: 298 mg/dl — AB (ref 70–99)

## 2018-04-05 MED ORDER — PROMETHAZINE HCL 25 MG RE SUPP: 25.0000 mg | Freq: Three times a day (TID) | RECTAL | 0 refills | Status: DC | PRN

## 2018-04-05 MED ORDER — SULFAMETHOXAZOLE-TRIMETHOPRIM 800-160 MG PO TABS: 1.0000 | ORAL_TABLET | Freq: Two times a day (BID) | ORAL | 0 refills | Status: DC

## 2018-04-05 MED ORDER — CETIRIZINE HCL 10 MG PO TABS: 10.0000 mg | ORAL_TABLET | Freq: Every day | ORAL | 1 refills | Status: DC

## 2018-04-05 MED FILL — CETIRIZINE HCL 10 MG TABLET: 10 | 30 days supply | Qty: 30 | Fill #0

## 2018-04-05 NOTE — Progress Notes (Signed)
Subjective:  Patient ID: Emily Farmer, female    DOB: [birth year]  Age: 62 y.o. MRN: [id number]  CC: Abdominal Pain   HPI Emily Farmer is a 62 year old female with a history of type 2 diabetes mellitus (A1c 17.2), hypertension, chronic diastolic CHF, A. fib who presents today for an acute visit complaining of a 4 day history of nausea and vomiting, flank pain, epigastric pain, neck pain. She has not had any vomiting today; states she did have clear vomitus yesterday and endorses compliance with Protonix.  Epigastric pain is absent at this time but she describes it as occurring with her flank pain.  Also states she does have some indigestion which 'goes away and then her abdominal pain comes'.  Denies diarrhea, constipation, dysuria, urinary frequency Her blood sugar is 298 in the clinic today and she informs me her fasting blood sugar at home was 156. She also feels like she is coming down with a cold that she is having body aches but no nasal congestion or fever she has chronic rhinorrhea she states.  Past Medical History:  Diagnosis Date  . Arthritis   . Atrial fibrillation ([place])    a. 09/2016 in setting of pancreatitis;  b. 09/2016 Echo: EF 65-60%, no rwma, Gr2 DD, mild MR, triv TR, PASP 72mHg;  CHA2DS2VASc = 4-->Eliquis 582mBID.  . Marland Kitchenhronic diastolic CHF (congestive heart failure) [place]  . Diabetes mellitus   . Gout   . Hyperlipidemia   . Hypertension   . Hypertriglyceridemia   . Pancreatitis    a. 09/2016 - Triglycerides 1,392 on admission.    Past Surgical History:  Procedure Laterality Date  . CHOLECYSTECTOMY N/A 01/04/2014   Procedure: LAPAROSCOPIC CHOLECYSTECTOMY WITH INTRAOPERATIVE CHOLANGIOGRAM;  Surgeon: ArRalene OkMD;  [facility] [facility]: [facility];  Laterality: N/A;  . LUNG SURGERY      No Known Allergies   Outpatient Medications Prior to Visit  Medication Sig Dispense Refill  . acetaminophen (TYLENOL) 325 MG tablet Take 2 tablets (650 mg  total) by mouth every 6 (six) hours as needed for mild pain (or Fever >/= 101).    . Marland Kitchenpixaban (ELIQUIS) 5 MG TABS tablet Take 1 tablet (5 mg total) by mouth 2 (two) times daily. 60 tablet 11  . aspirin EC 81 MG tablet Take 1 tablet (81 mg total) by mouth daily. 30 tablet 0  . atorvastatin (LIPITOR) 40 MG tablet Take 1 tablet (40 mg total) by mouth daily. 90 tablet 3  . Blood Glucose Monitoring Suppl (TRUE METRIX METER) w/Device KIT Use to monitor blood sugar as directed 1 kit 0  . dicyclomine (BENTYL) 20 MG tablet Take 1 tablet (20 mg total) by mouth 2 (two) times daily. 20 tablet 0  . fenofibrate 160 MG tablet Take 1 tablet (160 mg total) by mouth daily. 30 tablet 11  . glucose blood (TRUE METRIX BLOOD GLUCOSE TEST) test strip Use as instructed to check blood sugars 3 times per day 100 each 11  . hydroxypropyl methylcellulose / hypromellose (ISOPTO TEARS / GONIOVISC) 2.5 % ophthalmic solution Place 1 drop into both eyes as needed for dry eyes.    . Insulin Glargine (LANTUS SOLOSTAR) 100 UNIT/ML Solostar Pen Inject 15 units once per day (Patient taking differently: Inject 12 Units into the skin at bedtime. ) 5 pen 11  . Insulin Pen Needle (TRUEPLUS PEN NEEDLES) 32G X 4 MM MISC Use with insulin pen to inject Lantus daily. 100 each 11  . metFORMIN (GLUCOPHAGE) 500  MG tablet Take 2 tablets (1,000 mg total) by mouth 2 (two) times daily with a meal. 120 tablet 0  . metoprolol tartrate (LOPRESSOR) 25 MG tablet Take 1.5 tablets (37.5 mg total) by mouth 2 (two) times daily. 135 tablet 3  . Multiple Vitamins-Minerals (CENTRUM SILVER PO) Take 1 tablet by mouth daily.    . mupirocin ointment (BACTROBAN) 2 % Place 1 application into the nose 2 (two) times daily. (Patient taking differently: Place 1 application into the nose 2 (two) times daily as needed. ) 22 g 0  . omega-3 acid ethyl esters (LOVAZA) 1 g capsule Take 2 capsules (2 g total) by mouth 2 (two) times daily. 120 capsule 0  . ondansetron (ZOFRAN ODT) 4  MG disintegrating tablet Take 1 tablet (4 mg total) by mouth every 8 (eight) hours as needed for nausea or vomiting. 20 tablet 0  . pantoprazole (PROTONIX) 40 MG tablet Take 1 tablet (40 mg total) by mouth daily. 30 tablet 11  . TRUEPLUS LANCETS 28G MISC Use to check blood sugar 3 times per day 100 each 11  . famotidine (PEPCID) 20 MG tablet Take 1 tablet (20 mg total) by mouth 2 (two) times daily for 5 days. 10 tablet 0   No facility-administered medications prior to visit.     ROS Review of Systems  Constitutional: Negative for activity change, appetite change and fatigue.  HENT: Negative for congestion, sinus pressure and sore throat.   Eyes: Negative for visual disturbance.  Respiratory: Negative for cough, chest tightness, shortness of breath and wheezing.   Cardiovascular: Negative for chest pain and palpitations.  Gastrointestinal: Positive for abdominal pain, nausea and vomiting. Negative for abdominal distention and constipation.  Endocrine: Negative for polydipsia.  Genitourinary: Positive for flank pain. Negative for dysuria and frequency.  Musculoskeletal: Positive for myalgias. Negative for arthralgias and back pain.  Skin: Negative for rash.  Neurological: Negative for tremors, light-headedness and numbness.  Hematological: Does not bruise/bleed easily.  Psychiatric/Behavioral: Negative for agitation and behavioral problems.    Objective:  BP 100/64   Pulse 72   Temp 97.8 F (36.6 C) (Oral)   Ht '5\' 3"'  (1.6 m)   Wt 145 lb (65.8 kg)   SpO2 99%   BMI 25.69 kg/m   BP/Weight 04/05/2018 03/14/2018 01/23/1750  Systolic BP 025 852 778  Diastolic BP 64 71 83  Wt. (Lbs) 145 160 151  BMI 25.69 28.34 26.75      Physical Exam  Constitutional: She is oriented to person, place, and time. She appears well-developed and well-nourished.  Neck: No JVD present. No tracheal deviation present.  TTP of posterior cervical region  Cardiovascular: Normal rate, normal heart sounds  and intact distal pulses.  No murmur heard. Pulmonary/Chest: Effort normal and breath sounds normal. She has no wheezes. She has no rales. She exhibits no tenderness.  Abdominal: Soft. Bowel sounds are normal. She exhibits no distension and no mass. There is no tenderness.  Musculoskeletal: Normal range of motion.  Lymphadenopathy:    She has no cervical adenopathy.  Neurological: She is alert and oriented to person, place, and time.  Skin: Skin is warm and dry.  Psychiatric: She has a normal mood and affect.    Lab Results  Component Value Date   HGBA1C 17.2 (H) 01/28/2018    Assessment & Plan:   1. Diabetes mellitus type 2 in obese ([place]) Controlled with A1c of 17.2 but CBG in the clinic is 298 and fasting sugars 156 GI symptoms unlikely  to be related to diabetes Blood sugars reveal improvement-no regimen change today and advised to follow-up with current medications and with PCP - POCT glucose (manual entry)  2. Acute cystitis without hematuria Increase fluid intake - Basic Metabolic Panel - POCT URINALYSIS DIP (CLINITEK) - sulfamethoxazole-trimethoprim (BACTRIM DS) 800-160 MG tablet; Take 1 tablet by mouth 2 (two) times daily.  Dispense: 14 tablet; Refill: 0  3. Non-intractable vomiting with nausea, unspecified vomiting type Sinus symptoms could explain nausea and myalgias We will place on antihistamine BRAT diet Abdominal tenderness and is absent at this time, low suspicion for gastritis - cetirizine (ZYRTEC) 10 MG tablet; Take 1 tablet (10 mg total) by mouth daily.  Dispense: 30 tablet; Refill: 1 - promethazine (PHENERGAN) 25 MG suppository; Place 1 suppository (25 mg total) rectally every 8 (eight) hours as needed for nausea or vomiting.  Dispense: 12 each; Refill: 0   Meds ordered this encounter  Medications  . cetirizine (ZYRTEC) 10 MG tablet    Sig: Take 1 tablet (10 mg total) by mouth daily.    Dispense:  30 tablet    Refill:  1  . promethazine (PHENERGAN) 25  MG suppository    Sig: Place 1 suppository (25 mg total) rectally every 8 (eight) hours as needed for nausea or vomiting.    Dispense:  12 each    Refill:  0  . sulfamethoxazole-trimethoprim (BACTRIM DS) 800-160 MG tablet    Sig: Take 1 tablet by mouth 2 (two) times daily.    Dispense:  14 tablet    Refill:  0    Follow-up: Return for Of chronic medical conditions, keep previously scheduled appointment with PCP.   Charlott Rakes MD

## 2018-04-05 NOTE — Patient Instructions (Addendum)
Thank you for coming to see me today. Please do the following:  1. Increase Lantus to 18 units daily.  2. Continue 2 tablets twice a day of metformin 3. Continue checking blood sugars at home. 4. Continue making the lifestyle changes we've discussed together during our visit. Diet and exercise play a significant role in improving your blood sugars.  5. Follow-up with me in 1 month.     Hypoglycemia or low blood sugar:   Low blood sugar can happen quickly and may become an emergency if not treated right away.   While this shouldn't happen often, it can be brought upon if you skip a meal or do not eat enough. Also, if your insulin or other diabetes medications are dosed too high, this can cause your blood sugar to go to low.   Warning signs of low blood sugar include: 1. Feeling shaky or dizzy 2. Feeling weak or tired  3. Excessive hunger 4. Feeling anxious or upset  5. Sweating even when you aren't exercising  What to do if I experience low blood sugar? 1. Check your blood sugar with your meter. If lower than 70, proceed to step 2.  2. Treat with 3-4 glucose tablets or 3 packets of regular sugar. If these aren't around, you can try hard candy. Yet another option would be to drink 4 ounces of fruit juice or 6 ounces of REGULAR soda.  3. Re-check your sugar in 15 minutes. If it is still below 70, do what you did in step 2 again. If has come back up, go ahead and eat a snack or small meal at this time.

## 2018-04-05 NOTE — Patient Instructions (Signed)
Nausea and Vomiting, Adult Feeling sick to your stomach (nausea) means that your stomach is upset or you feel like you have to throw up (vomit). Feeling more and more sick to your stomach can lead to throwing up. Throwing up happens when food and liquid from your stomach are thrown up and out the mouth. Throwing up can make you feel weak and cause you to get dehydrated. Dehydration can make you tired and thirsty, make you have a dry mouth, and make it so you pee (urinate) less often. Older adults and people with other diseases or a weak defense system (immune system) are at higher risk for dehydration. If you feel sick to your stomach or if you throw up, it is important to follow instructions from your doctor about how to take care of yourself. Follow these instructions at home: Eating and drinking Follow these instructions as told by your doctor:  Take an oral rehydration solution (ORS). This is a drink that is sold at pharmacies and stores.  Drink clear fluids in small amounts as you are able, such as: ? Water. ? Ice chips. ? Diluted fruit juice. ? Low-calorie sports drinks.  Eat bland, easy-to-digest foods in small amounts as you are able, such as: ? Bananas. ? Applesauce. ? Rice. ? Low-fat (lean) meats. ? Toast. ? Crackers.  Avoid fluids that have a lot of sugar or caffeine in them.  Avoid alcohol.  Avoid spicy or fatty foods.  General instructions  Drink enough fluid to keep your pee (urine) clear or pale yellow.  Wash your hands often. If you cannot use soap and water, use hand sanitizer.  Make sure that all people in your home wash their hands well and often.  Take over-the-counter and prescription medicines only as told by your doctor.  Rest at home while you get better.  Watch your condition for any changes.  Breathe slowly and deeply when you feel sick to your stomach.  Keep all follow-up visits as told by your doctor. This is important. Contact a doctor  if:  You have a fever.  You cannot keep fluids down.  Your symptoms get worse.  You have new symptoms.  You feel sick to your stomach for more than two days.  You feel light-headed or dizzy.  You have a headache.  You have muscle cramps. Get help right away if:  You have pain in your chest, neck, arm, or jaw.  You feel very weak or you pass out (faint).  You throw up again and again.  You see blood in your throw-up.  Your throw-up looks like black coffee grounds.  You have bloody or black poop (stools) or poop that look like tar.  You have a very bad headache, a stiff neck, or both.  You have a rash.  You have very bad pain, cramping, or bloating in your belly (abdomen).  You have trouble breathing.  You are breathing very quickly.  Your heart is beating very quickly.  Your skin feels cold and clammy.  You feel confused.  You have pain when you pee.  You have signs of dehydration, such as: ? Dark pee, hardly any pee, or no pee. ? Cracked lips. ? Dry mouth. ? Sunken eyes. ? Sleepiness. ? Weakness. These symptoms may be an emergency. Do not wait to see if the symptoms will go away. Get medical help right away. Call your local emergency services (911 in the [place] Do not drive yourself to the hospital. This information is   not intended to replace advice given to you by your health care provider. Make sure you discuss any questions you have with your health care provider. Document Released: 11/19/2007 Document Revised: 12/21/2015 Document Reviewed: 02/06/2015 Elsevier Interactive Patient Education  2018 Elsevier Inc.  

## 2018-04-05 NOTE — Progress Notes (Signed)
    S:    PCP: Dr. Jillyn Hidden  No chief complaint on file.  Patient arrives in good spirits. Presents for diabetes management at the request of Dr. Jillyn Hidden. Patient was referred on 02/22/18. Patient was last seen by PCP on 02/22/18. I last saw her 03/22/18. Stopped Novolog 70/30 and continued Lantus. Increased metformin.   Family/Social History:  - FH: DM (sister) - Tobacco: denies - Alcohol: denies  Insurance coverage/medication affordability:  - PASS via [facility]  Patient reports adherence with medications.  Current diabetes medications include:  - metformin 500 mg BID - insulin glargine 15 units daily.   Patient denies hypoglycemic events.  Patient reported dietary habits:  - Reports high intake of white carbs  - "Usually I eat spaghetti or a cheese steak sandwich"    Patient-reported exercise habits:  - Patient does not exercise   Patient denies nocturia.  Patient reports neuropathy. Baseline Patient reports visual changes. Baseline Patient reports self foot exams.   O:  POCT: 298 this AM Home glucose levels: reports 150s - 200s  Lab Results  Component Value Date   HGBA1C 17.2 (H) 01/28/2018   There were no vitals filed for this visit.  Lipid Panel     Component Value Date/Time   CHOL 187 03/22/2018 1053   TRIG 163 (H) 03/22/2018 1053   HDL 49 03/22/2018 1053   CHOLHDL 3.8 03/22/2018 1053   CHOLHDL 8.8 (H) 11/21/2016 0822   VLDL NOT CALC 11/21/2016 0822   LDLCALC 105 (H) 03/22/2018 1053   LDLDIRECT 211 (H) 05/04/2017 0842   LDLDIRECT 170 (H) 11/21/2016 1610    Clinical ASCVD: Yes   A/P: Diabetes longstanding currently uncontrolled. Patient is able to verbalize appropriate hypoglycemia management plan. Patient is not adherent with medication. Control is suboptimal due to non-adherence, dietary indiscretion, and physical inactivity.  In the future, would recommend AGAINST TZD or DPP-IV inhibitor d/t heart failure hx. Patient would likely benefit from Victoza.  Mentioned that we will increase Lantus and defer decision to initiate Victoza to Dr. Jillyn Hidden. Noted to have history of pancreatitis.   -Increased Lantus from 15 units to 18 units daily  -Continued metformin to 1000 mg BID -Extensively discussed pathophysiology of DM, recommended lifestyle interventions, dietary effects on glycemic control -Counseled on s/sx of and management of hypoglycemia -Next A1C anticipated 04/2018.   ASCVD risk - secondary prevention in patient with DM + CAD. Last LDL not controlled. High intensity statin indicated.  -Continued aspirin 81 mg  -Continued aotrvastatin 40 mg.   Written patient instructions provided.  Total time in face to face counseling 30 minutes.   Follow up Pharmacist Clinic Visit in 2 weeks.  Butch Penny, PharmD, CPP Clinical Pharmacist [facility] [phone]

## 2018-04-05 NOTE — Progress Notes (Signed)
Patient is having stomach, neck and flank pain.

## 2018-04-06 ENCOUNTER — Encounter: Payer: Self-pay | Admitting: Pharmacist

## 2018-04-06 ENCOUNTER — Ambulatory Visit: Payer: Medicaid Other | Admitting: Family Medicine

## 2018-04-06 LAB — BASIC METABOLIC PANEL
BUN/Creatinine Ratio: 24 (ref 12–28)
BUN: 33 mg/dL — ABNORMAL HIGH (ref 8–27)
CO2: 18 mmol/L — ABNORMAL LOW (ref 20–29)
Calcium: 8.8 mg/dL (ref 8.7–10.3)
Chloride: 99 mmol/L (ref 96–106)
Creatinine, Ser: 1.38 mg/dL — ABNORMAL HIGH (ref 0.57–1.00)
GFR calc non Af Amer: 41 mL/min/{1.73_m2} — ABNORMAL LOW (ref >59)
GFR, EST AFRICAN AMERICAN: 48 mL/min/{1.73_m2} — AB (ref >59)
Glucose: 295 mg/dL — ABNORMAL HIGH (ref 65–99)
POTASSIUM: 4.8 mmol/L (ref 3.5–5.2)
SODIUM: 134 mmol/L (ref 134–144)

## 2018-04-13 ENCOUNTER — Telehealth: Payer: Self-pay

## 2018-04-13 NOTE — Telephone Encounter (Signed)
-----   Message from Hoy Register, MD sent at 04/07/2018  1:16 PM EDT ----- Labs revealed slightly abnormal kidney function which could be secondary to diabetes affecting her kidneys.  Please encourage compliance with medication and optimal glycemic control to prevent worsening of kidney function.

## 2018-04-13 NOTE — Telephone Encounter (Signed)
Patient was called and informed of lab results. 

## 2018-05-06 ENCOUNTER — Encounter: Payer: Self-pay | Admitting: Pharmacist

## 2018-05-06 ENCOUNTER — Ambulatory Visit: Payer: Self-pay | Attending: Family Medicine | Admitting: Pharmacist

## 2018-05-06 DIAGNOSIS — Z794 Long term (current) use of insulin: Secondary | ICD-10-CM | POA: Insufficient documentation

## 2018-05-06 DIAGNOSIS — E118 Type 2 diabetes mellitus with unspecified complications: Secondary | ICD-10-CM

## 2018-05-06 DIAGNOSIS — IMO0002 Reserved for concepts with insufficient information to code with codable children: Secondary | ICD-10-CM

## 2018-05-06 DIAGNOSIS — I2510 Atherosclerotic heart disease of native coronary artery without angina pectoris: Secondary | ICD-10-CM | POA: Insufficient documentation

## 2018-05-06 DIAGNOSIS — Z833 Family history of diabetes mellitus: Secondary | ICD-10-CM | POA: Insufficient documentation

## 2018-05-06 DIAGNOSIS — E1165 Type 2 diabetes mellitus with hyperglycemia: Secondary | ICD-10-CM

## 2018-05-06 LAB — POCT GLYCOSYLATED HEMOGLOBIN (HGB A1C): Hemoglobin A1C: 9.8 % — AB (ref 4.0–5.6)

## 2018-05-06 LAB — GLUCOSE, POCT (MANUAL RESULT ENTRY): POC Glucose: 212 mg/dL — AB (ref 70–99)

## 2018-05-06 MED ORDER — "INSULIN SYRINGE-NEEDLE U-100 31G X 5/16"" 0.3 ML MISC": 11 refills | Status: DC

## 2018-05-06 MED ORDER — INSULIN GLARGINE 100 UNIT/ML ~~LOC~~ SOLN: SUBCUTANEOUS | 2 refills | Status: DC

## 2018-05-06 MED ORDER — INSULIN GLARGINE 100 UNIT/ML SOLOSTAR PEN: PEN_INJECTOR | SUBCUTANEOUS | 2 refills | Status: DC

## 2018-05-06 MED FILL — TRUEplus LANCETS 28G MISC: 30 days supply | Qty: 100 | Fill #1

## 2018-05-06 MED FILL — TRUE METRIX TEST STRIP: 30 days supply | Qty: 100 | Fill #1

## 2018-05-06 MED FILL — TRUEPLUS SYR 0.3ML 31GX5/16: 31G X 5/16" | 30 days supply | Qty: 100 | Fill #0

## 2018-05-06 MED FILL — $LANTUS SOLOSTAR 100 UNITS/: 100 | 50 days supply | Qty: 15 | Fill #0

## 2018-05-06 NOTE — Patient Instructions (Addendum)
Thank you for coming to see me today. Please do the following:  1. Increase Lantus to 25 units. If morning blood sugars are consistently above 130 after 2-3 days, increase to 28 units. If they remain above 130, increase to 30 units.  2. Stop metformin for now.  3. Continue checking blood sugars at home. 4. Continue making the lifestyle changes we've discussed together during our visit. Diet and exercise play a significant role in improving your blood sugars.  5. Follow-up with Dr. Jillyn HiddenFulp on the 6th of December.    Hypoglycemia or low blood sugar:   Low blood sugar can happen quickly and may become an emergency if not treated right away.   While this shouldn't happen often, it can be brought upon if you skip a meal or do not eat enough. Also, if your insulin or other diabetes medications are dosed too high, this can cause your blood sugar to go to low.   Warning signs of low blood sugar include: 1. Feeling shaky or dizzy 2. Feeling weak or tired  3. Excessive hunger 4. Feeling anxious or upset  5. Sweating even when you aren't exercising  What to do if I experience low blood sugar? 1. Check your blood sugar with your meter. If lower than 70, proceed to step 2.  2. Treat with 3-4 glucose tablets or 3 packets of regular sugar. If these aren't around, you can try hard candy. Yet another option would be to drink 4 ounces of fruit juice or 6 ounces of REGULAR soda.  3. Re-check your sugar in 15 minutes. If it is still below 70, do what you did in step 2 again. If has come back up, go ahead and eat a snack or small meal at this time.

## 2018-05-06 NOTE — Progress Notes (Signed)
    S:    PCP: Dr. Jillyn HiddenFulp  No chief complaint on file.  Patient arrives in in good spirits. Presents for diabetes management at the request of Dr. Jillyn HiddenFulp. Patient was referred and last seen on 02/22/18. I last saw her 04/05/18. Lantus was increased.   Family/Social History: DM (sister), denies tobacco use, denies alcohol use  Insurance coverage/medication affordability: [facility]  Patient reports adherence with medications.  Current diabetes medications include:  - metformin 500 mg 2 tablets BID - insulin glargine 18 units daily.   Patient denies hypoglycemic events.  Patient reported dietary habits:  - Reports high intake of white carbs  - "Usually I eat spaghetti or a cheese steak sandwich"    Patient-reported exercise habits:  - no changes; pt does not exercise   Patient denies nocturia.  Patient reports neuropathy. Baseline Patient reports visual changes. Baseline Patient reports self foot exams.   O:  POCT: 212  Home glucose levels: reports 90 - 220s   Lab Results  Component Value Date   HGBA1C 9.8 (A) 05/06/2018   There were no vitals filed for this visit.  Lipid Panel     Component Value Date/Time   CHOL 187 03/22/2018 1053   TRIG 163 (H) 03/22/2018 1053   HDL 49 03/22/2018 1053   CHOLHDL 3.8 03/22/2018 1053   CHOLHDL 8.8 (H) 11/21/2016 0822   VLDL NOT CALC 11/21/2016 0822   LDLCALC 105 (H) 03/22/2018 1053   LDLDIRECT 211 (H) 05/04/2017 0842   LDLDIRECT 170 (H) 11/21/2016 [id number]    Clinical ASCVD: Yes   A/P: Diabetes longstanding currently uncontrolled. Patient is able to verbalize appropriate hypoglycemia management plan. Patient is adherent with medication. Control is suboptimal due to non-adherence, dietary indiscretion, and physical inactivity.  Her A1c today is improved at 9.8 (from 17.2). Commended her for her medication adherence. Of note, she had blood work done in October that revealed worsening of GFR. GFR fell to 41  while on metformin therapy. Will have her stop metformin for now and defer to PCP for re-initiation.   -Increased Lantus from 18 units to 25 units. Pt will titrate to 28 units in 2-3 days if fasting sugars > 130. She will again titrate to 30 units if fasting sugars remain above 130.  -Hold metformin for now. -Extensively discussed pathophysiology of DM, recommended lifestyle interventions, and dietary effects on glycemic control. -Counseled on s/sx of and management of hypoglycemia. -Microalb-SCr  -Pneumovax given  ASCVD risk - secondary prevention in patient with DM + CAD. Last LDL not controlled. High intensity statin indicated.  -Continued aspirin 81 mg  -Continued aotrvastatin 40 mg    Written patient instructions provided.  Total time in face to face counseling 30 minutes.   Follow up with PCP 05/24/18.  Butch PennyLuke Van Ausdall, PharmD, CPP Clinical Pharmacist [facility] [phone]

## 2018-05-07 LAB — MICROALBUMIN / CREATININE URINE RATIO
Creatinine, Urine: 129.9 mg/dL
Microalb/Creat Ratio: 122.7 mg/g{creat} — ABNORMAL HIGH (ref 0.0–30.0)
Microalbumin, Urine: 159.4 ug/mL

## 2018-05-11 ENCOUNTER — Ambulatory Visit (INDEPENDENT_AMBULATORY_CARE_PROVIDER_SITE_OTHER): Payer: Self-pay | Admitting: Internal Medicine

## 2018-05-11 ENCOUNTER — Encounter: Payer: Self-pay | Admitting: Internal Medicine

## 2018-05-11 VITALS — BP 110/62 | HR 69 | Ht 63.0 in | Wt 158.8 lb

## 2018-05-11 DIAGNOSIS — E782 Mixed hyperlipidemia: Secondary | ICD-10-CM

## 2018-05-11 DIAGNOSIS — I10 Essential (primary) hypertension: Secondary | ICD-10-CM

## 2018-05-11 DIAGNOSIS — I480 Paroxysmal atrial fibrillation: Secondary | ICD-10-CM

## 2018-05-11 MED ORDER — FENOFIBRATE 160 MG PO TABS: 160.0000 mg | ORAL_TABLET | Freq: Every day | ORAL | 11 refills | Status: DC

## 2018-05-11 MED ORDER — APIXABAN 5 MG PO TABS: 5.0000 mg | ORAL_TABLET | Freq: Two times a day (BID) | ORAL | 11 refills | Status: DC

## 2018-05-11 MED ORDER — ATORVASTATIN CALCIUM 40 MG PO TABS: 40.0000 mg | ORAL_TABLET | Freq: Every day | ORAL | 3 refills | Status: DC

## 2018-05-11 MED FILL — !ELIQUIS 5MG TABLET: 5 | 30 days supply | Qty: 60 | Fill #0

## 2018-05-11 MED FILL — ATORVASTATIN CALCIUM 40 MG: 40 | 30 days supply | Qty: 30 | Fill #0

## 2018-05-11 MED FILL — FENOFIBRATE 160 MG TABLET: 160 | 30 days supply | Qty: 30 | Fill #0

## 2018-05-11 NOTE — Patient Instructions (Signed)
Medication Instructions:  Continue current medications If you need a refill on your cardiac medications before your next appointment, please call your pharmacy.   Lab work: NONE If you have labs (blood work) drawn today and your tests are completely normal, you will receive your results only by: Marland Kitchen. MyChart Message (if you have MyChart) OR . A paper copy in the mail If you have any lab test that is abnormal or we need to change your treatment, we will call you to review the results.  Testing/Procedures: NONE  Follow-Up: At [facility], you and your health needs are our priority.  As part of our continuing mission to provide you with exceptional heart care, we have created designated Provider Care Teams.  These Care Teams include your primary Cardiologist (physician) and Advanced Practice Providers (APPs -  Physician Assistants and Nurse Practitioners) who all work together to provide you with the care you need, when you need it. . You will need a follow up appointment in 6 months with Emily Farmer and 1 year with Emily Farmer.  Please call our office 2 months in advance to schedule this appointment.    Any Other Special Instructions Will Be Listed Below (If Applicable).

## 2018-05-11 NOTE — Progress Notes (Signed)
OFFICE FOLLOW-UP NOTE  Chief Complaint:  Routine follow-up  Primary Care Physician: Antony Blackbird, MD  HPI:  Emily Farmer is a 62 y.o. female with a past medial history significant for recent admission for atrial fibrillation with rapid ventricular response in the setting of acute pancreatitis. Triglycerides were markedly elevated in the thousands. She was initially placed on heparin and fenofibrate. Subsequently she was found to have an elevated LDL-C and was started on atorvastatin 10 mg daily. She was seen in follow-up by one of our nurse practitioners and noted that she was doing fairly well and that her cholesterol numbers had improved somewhat. This was several months ago. Today she tells me that she's been out of her medications and she last saw him. She says that she has no health insurance and although she works cannot afford her medications. She is only currently taking insulin and metformin, but should be on Eliquis for stroke prevention, atorvastatin, fenofibrate, metoprolol and pantoprazole. Most recently her triglycerides went up to 959, but were as low as 374 in April when she was on medication. Denies any recurrent palpitations.  06/01/2017  Emily Farmer was seen today in follow-up.  Unfortunately she has been noncompliant with medical therapy.  She had repeat lab work which shows essentially no change in her lipid profile.  Triglycerides remain in the 800s.  She is not taking her statin, fenofibrate or omega-3 fatty acids.  The main issue is cost for her.  She is also noncompliant with Eliquis.  She is interested in samples today.  She says that she had Medicaid through her son however he aged out of that and the Medicaid went with him.  She is apparently not been able to obtain Medicaid for herself.  I am not sure if she looked into an orange card.  05/11/2018  Emily Farmer is seen today in follow-up.  Fortunately she is managed to get Medicaid.  This is helped significantly for  medication compliance.  After taking medications her numbers have improved significantly.  Her total cholesterol is dropped from 3 43-1 87.  Triglycerides from 816-163.  LDL is down 105.  She denies any recurrent pancreatitis.  Unfortunately she has had some atrial fibrillation.  He has been on Eliquis more consistently and underwent cardioversion in the ER over the summer.  She saw how main, PA-C in follow-up.  He increased her beta-blocker.  She appears to be in sinus rhythm today and denies any recurrent A. fib.  PMHx:  Past Medical History:  Diagnosis Date  . Arthritis   . Atrial fibrillation ([place])    a. 09/2016 in setting of pancreatitis;  b. 09/2016 Echo: EF 65-60%, no rwma, Gr2 DD, mild MR, triv TR, PASP 7mHg;  CHA2DS2VASc = 4-->Eliquis 548mBID.  . Marland Kitchenhronic diastolic CHF (congestive heart failure) ([facility]8/15/2019  . Diabetes mellitus   . Gout   . Hyperlipidemia   . Hypertension   . Hypertriglyceridemia   . Pancreatitis    a. 09/2016 - Triglycerides 1,392 on admission.    Past Surgical History:  Procedure Laterality Date  . CHOLECYSTECTOMY N/A 01/04/2014   Procedure: LAPAROSCOPIC CHOLECYSTECTOMY WITH INTRAOPERATIVE CHOLANGIOGRAM;  Surgeon: ArRalene OkMD;  [facility] Service: General;  Laterality: N/A;  . LUNG SURGERY      FAMHx:  Family History  Problem Relation Age of Onset  . Heart attack Mother   . Heart attack Father   . Diabetes Sister   . High blood pressure Neg Hx   .  High Cholesterol Neg Hx     SOCHx:   reports that she has never smoked. She has never used smokeless tobacco. She reports that she does not drink alcohol or use drugs.  ALLERGIES:  No Known Allergies  ROS: Pertinent items noted in HPI and remainder of comprehensive ROS otherwise negative.  HOME MEDS: Current Outpatient Medications on File Prior to Visit  Medication Sig Dispense Refill  . acetaminophen (TYLENOL) 325 MG tablet Take 2 tablets (650 mg total) by mouth every 6 (six) hours  as needed for mild pain (or Fever >/= 101).    Marland Kitchen apixaban (ELIQUIS) 5 MG TABS tablet Take 1 tablet (5 mg total) by mouth 2 (two) times daily. 60 tablet 11  . aspirin EC 81 MG tablet Take 1 tablet (81 mg total) by mouth daily. 30 tablet 0  . atorvastatin (LIPITOR) 40 MG tablet Take 1 tablet (40 mg total) by mouth daily. 90 tablet 3  . Blood Glucose Monitoring Suppl (TRUE METRIX METER) w/Device KIT Use to monitor blood sugar as directed 1 kit 0  . cetirizine (ZYRTEC) 10 MG tablet Take 1 tablet (10 mg total) by mouth daily. 30 tablet 1  . dicyclomine (BENTYL) 20 MG tablet Take 1 tablet (20 mg total) by mouth 2 (two) times daily. 20 tablet 0  . fenofibrate 160 MG tablet Take 1 tablet (160 mg total) by mouth daily. 30 tablet 11  . glucose blood (TRUE METRIX BLOOD GLUCOSE TEST) test strip Use as instructed to check blood sugars 3 times per day 100 each 11  . hydroxypropyl methylcellulose / hypromellose (ISOPTO TEARS / GONIOVISC) 2.5 % ophthalmic solution Place 1 drop into both eyes as needed for dry eyes.    . Insulin Glargine (LANTUS SOLOSTAR) 100 UNIT/ML Solostar Pen Inject 25 units daily. If sugars are > 130 after 3 days, increase to 28 units. If they remain elevated, increase to 30 units. 15 mL 2  . Insulin Pen Needle (TRUEPLUS PEN NEEDLES) 32G X 4 MM MISC Use with insulin pen to inject Lantus daily. 100 each 11  . Insulin Syringe-Needle U-100 (TRUEPLUS INSULIN SYRINGE) 31G X 5/16" 0.3 ML MISC Use to inject insulin daily. 100 each 11  . metFORMIN (GLUCOPHAGE) 500 MG tablet Take 2 tablets (1,000 mg total) by mouth 2 (two) times daily with a meal. 120 tablet 0  . metoprolol tartrate (LOPRESSOR) 25 MG tablet Take 1.5 tablets (37.5 mg total) by mouth 2 (two) times daily. 135 tablet 3  . Multiple Vitamins-Minerals (CENTRUM SILVER PO) Take 1 tablet by mouth daily.    . mupirocin ointment (BACTROBAN) 2 % Place 1 application into the nose 2 (two) times daily. (Patient taking differently: Place 1 application  into the nose 2 (two) times daily as needed. ) 22 g 0  . omega-3 acid ethyl esters (LOVAZA) 1 g capsule Take 2 capsules (2 g total) by mouth 2 (two) times daily. 120 capsule 0  . ondansetron (ZOFRAN ODT) 4 MG disintegrating tablet Take 1 tablet (4 mg total) by mouth every 8 (eight) hours as needed for nausea or vomiting. 20 tablet 0  . pantoprazole (PROTONIX) 40 MG tablet Take 1 tablet (40 mg total) by mouth daily. 30 tablet 11  . promethazine (PHENERGAN) 25 MG suppository Place 1 suppository (25 mg total) rectally every 8 (eight) hours as needed for nausea or vomiting. 12 each 0  . sulfamethoxazole-trimethoprim (BACTRIM DS) 800-160 MG tablet Take 1 tablet by mouth 2 (two) times daily. 14 tablet 0  .  TRUEPLUS LANCETS 28G MISC Use to check blood sugar 3 times per day 100 each 11  . famotidine (PEPCID) 20 MG tablet Take 1 tablet (20 mg total) by mouth 2 (two) times daily for 5 days. 10 tablet 0   No current facility-administered medications on file prior to visit.     LABS/IMAGING: No results found for this or any previous visit (from the past 48 hour(s)). No results found.  LIPID PANEL:    Component Value Date/Time   CHOL 187 03/22/2018 1053   TRIG 163 (H) 03/22/2018 1053   HDL 49 03/22/2018 1053   CHOLHDL 3.8 03/22/2018 1053   CHOLHDL 8.8 (H) 11/21/2016 0822   VLDL NOT CALC 11/21/2016 0822   LDLCALC 105 (H) 03/22/2018 1053   LDLDIRECT 211 (H) 05/04/2017 0842   LDLDIRECT 170 (H) 11/21/2016 0822     WEIGHTS: Wt Readings from Last 3 Encounters:  05/11/18 158 lb 12.8 oz (72 kg)  04/05/18 145 lb (65.8 kg)  03/14/18 160 lb (72.6 kg)    VITALS: BP 110/62   Pulse 69   Ht '5\' 3"'  (1.6 m)   Wt 158 lb 12.8 oz (72 kg)   BMI 28.13 kg/m   EXAM: General appearance: alert and no distress Neck: no carotid bruit, no JVD and thyroid not enlarged, symmetric, no tenderness/mass/nodules Lungs: clear to auscultation bilaterally Heart: regular rate and rhythm, S1, S2 normal, no murmur, click,  rub or gallop Abdomen: soft, non-tender; bowel sounds normal; no masses,  no organomegaly Extremities: extremities normal, atraumatic, no cyanosis or edema Pulses: 2+ and symmetric Skin: Skin color, texture, turgor normal. No rashes or lesions Neurologic: Grossly normal Psych: Pleasant  EKG: Normal sinus rhythm 69, nonspecific T wave changes-personally reviewed  ASSESSMENT: 1. Hyperchylomicronemia 2. Pancreatitis secondary to problem #1 3. Paroxysmal atrial fibrillation-CHADSVASC score 4 4. Type 2 diabetes on insulin 5. Essential hypertension  PLAN: 1.   Mrs. Biegler has had significant improvement in her lipid profile, specifically her triglycerides.  She has had no further pancreatitis.  Her A. fib did require cardioversion again in the summer but has been well controlled.  She has been compliant on Eliquis.  Blood pressure is at goal.  Her diabetes is improving.  Overall she is doing better and has been able to get her medications.  No changes in her regimen today.  Follow-up with an APP in 6 months and me in 1 year.  Pixie Casino, MD, [facility], [place] Director of the [facility] Lipid [facility]  Card[facility] Attending Cardiologist  Direct Dial: [phone]  Fax: [phone]  Website:  [email or web address] 05/11/2018, 2:43 PM

## 2018-05-24 ENCOUNTER — Ambulatory Visit: Payer: Medicaid Other | Admitting: Family Medicine

## 2018-06-03 ENCOUNTER — Ambulatory Visit: Payer: Self-pay | Attending: Family Medicine | Admitting: Family Medicine

## 2018-06-03 ENCOUNTER — Encounter: Payer: Self-pay | Admitting: Family Medicine

## 2018-06-03 VITALS — BP 107/67 | HR 70 | Temp 97.8°F | Resp 18 | Ht 63.0 in | Wt 157.0 lb

## 2018-06-03 DIAGNOSIS — W06XXXA Fall from bed, initial encounter: Secondary | ICD-10-CM

## 2018-06-03 DIAGNOSIS — R519 Headache, unspecified: Secondary | ICD-10-CM

## 2018-06-03 DIAGNOSIS — Z79899 Other long term (current) drug therapy: Secondary | ICD-10-CM

## 2018-06-03 DIAGNOSIS — E669 Obesity, unspecified: Secondary | ICD-10-CM

## 2018-06-03 DIAGNOSIS — R270 Ataxia, unspecified: Secondary | ICD-10-CM

## 2018-06-03 DIAGNOSIS — Z6827 Body mass index (BMI) 27.0-27.9, adult: Secondary | ICD-10-CM

## 2018-06-03 DIAGNOSIS — R0602 Shortness of breath: Secondary | ICD-10-CM

## 2018-06-03 DIAGNOSIS — R42 Dizziness and giddiness: Secondary | ICD-10-CM

## 2018-06-03 DIAGNOSIS — E1165 Type 2 diabetes mellitus with hyperglycemia: Secondary | ICD-10-CM

## 2018-06-03 DIAGNOSIS — Z794 Long term (current) use of insulin: Secondary | ICD-10-CM

## 2018-06-03 DIAGNOSIS — R51 Headache: Secondary | ICD-10-CM

## 2018-06-03 DIAGNOSIS — E1169 Type 2 diabetes mellitus with other specified complication: Secondary | ICD-10-CM

## 2018-06-03 LAB — GLUCOSE, POCT (MANUAL RESULT ENTRY): POC Glucose: 229 mg/dL — AB (ref 70–99)

## 2018-06-03 MED ORDER — AZITHROMYCIN 250 MG PO TABS: ORAL_TABLET | ORAL | 0 refills | Status: DC

## 2018-06-03 MED FILL — AZITHROMYCIN 250 MG TABLET: 250 | 5 days supply | Qty: 6 | Fill #0

## 2018-06-03 MED FILL — !ELIQUIS 5MG TABLET: 5 | 30 days supply | Qty: 60 | Fill #0

## 2018-06-03 MED FILL — TRUE METRIX TEST STRIP: 30 days supply | Qty: 100 | Fill #2

## 2018-06-03 MED FILL — ATORVASTATIN CALCIUM 40 MG: 40 | 30 days supply | Qty: 30 | Fill #0

## 2018-06-03 NOTE — Progress Notes (Signed)
Patient hit her head rolling out of bed last week and has intermittent headaches/sharp pains on the right side since. Denies pain currently.

## 2018-06-03 NOTE — Progress Notes (Signed)
Subjective:    Patient ID: Emily Farmer, female    DOB: [birth year], 62 y.o.   MRN: [id number]  HPI       62 year old female seen in follow-up of diabetes and patient is status post recent cardiology visit on 05/11/2018.  Patient was seen by cardiology in follow-up of history of paroxysmal atrial fibrillation and hyperlipidemia with her triglycerides previously been in the thousands which led to patient having pancreatitis and hospital admission.  In the past per cardiology, patient reported that she was only taking her insulin and metformin as she could not afford the Eliquis, atorvastatin, fenofibrate metoprolol and pantoprazole.  Patient's most recent triglycerides were 959 in April when she was on medication, triglycerides were 374.  Patient now has been able to obtain Medicaid and is compliant with her medications.  Patient has also recently seen the clinical pharmacist here regarding her diabetes on 05/06/2018.  A1c had improved to 9.8 from her previous 17.2.  Patient's metformin was stopped however because of a decline in EGFR.  Patient's Lantus was increased from 18 to 25 units and patient was asked to titrate to get fasting sugars less than 130.      Patient reports that about 2 to 3 weeks ago she was getting ready to go to bed and did not turn the light on in her bedroom.  Patient thought that she was sitting down at the top of her bed but actually tried to sit down near the end of her bed but she was not completely on the bed and slid down which caused her to fall to her side and hit her head on the nightstand.  Patient states that a relative was at her house and heard her fall and helped her get back up.  Patient states that she felt somewhat dizzy and confused and had a headache but decided to try and go to sleep and see if she felt better the next day.  Patient now with complaint of a continued headache on the left side of her head that she hit against the nightstand.  Patient also feels that  when she is walking she tends to drift and feels as if she might fall.      Patient does not feel as if she is having any episodes of low blood sugar.  Patient gets up very early to go to work therefore she rarely checks fasting blood sugars but later in the day her blood sugars are in the mid 100s to low 200s.  Patient reports that she has been compliant with her medications.  Patient has some occasional increase in urinary frequency if her sugars are high.  Patient denies any dysuria.  Patient states that she has had some occasional shortness of breath. Patient with some cough but non-productive.   Patient has noticed no increase swelling in her legs.  Patient states that overall she is starting to feel much better other than recently hitting her head and now having headaches. She denies any chest and no sensation of palpitations. Patient has had afib in the past but does not feel like she did when she had this in the past.  Review of Systems  Constitutional: Positive for fatigue. Negative for chills and fever.  HENT: Positive for congestion and postnasal drip. Negative for sore throat and trouble swallowing.   Eyes: Negative for photophobia and visual disturbance.  Respiratory: Positive for cough and shortness of breath.   Cardiovascular: Negative for chest pain, palpitations and leg swelling.  Gastrointestinal: Negative for abdominal pain, blood in stool, constipation, diarrhea and nausea.  Endocrine: Negative for polydipsia, polyphagia and polyuria.  Genitourinary: Positive for frequency (can occur when her BS is high). Negative for dysuria.  Neurological: Positive for dizziness (feels off-balance when walking since she hit her head), light-headedness and headaches.  Hematological: Negative for adenopathy. Does not bruise/bleed easily.       Objective:   Physical Exam BP 107/67 (BP Location: Left Arm, Patient Position: Sitting, Cuff Size: Normal)   Pulse 70   Temp 97.8 F (36.6 C) (Oral)    Resp 18   Ht '5\' 3"'  (1.6 m)   Wt 157 lb (71.2 kg)   SpO2 100%   BMI 27.81 kg/m Nurse's notes and vital signs reviewed General- small statured older female in no acute distress ENT- TMs dull, nares with mild edema of the nasal turbinates with mild clear nasal discharge, patient with mild posterior pharynx erythema Neck-supple, no lymphadenopathy, no carotid bruits appreciated on exam Cardiovascular- appears to be in normal sinus rhythm with normal rate Lungs- decreased breath sounds at the lung bases, breathing is nonlabored Abdomen-soft, nontender Back-no CVA tenderness, mild lumbosacral discomfort to palpation EXT- no edema Neuro-cranial nerves II through XII are grossly intact, no focal neurologic deficits on exam      Assessment & Plan:  1. Diabetes mellitus type 2 in obese [facility]) Above diagnosis entered by CMA in order to check point of care glucose per protocol - Glucose (CBG)  2. Type 2 diabetes mellitus with hyperglycemia, with long-term current use of insulin ([place]) Patient reports increased compliance with her BP medications since being approved for Medicaid and patient has met with the clinic pharmacist twice since her last visit in order to improve the control of her DM. Patient saw the CPP most recently on 05/06/18 and her metformin was stopped due to a decline in her GFR and her Lantus was increased from 18 to 25 units with instructions to titrate dose if needed. Patient has not titrated dose and glucose today was 229. Patient also tends to eat processed foods which tend to be higher in carbohydrates as this are easier and cheaper for her to obtain but which result in poor control of her diabetes. Discussed some food substitutions and titration of glucose as discussed by CPP.  CMET at today's visit in follow-up. - Comprehensive metabolic panel  3. New onset of headaches She reports recent new onset of headaches and feeling off-balance when walking after hitting her head on a  dresser after sliding off of her bed. She is on blood thinning medication, Eliquis,  due to paroxysmal afib and head CT will be obtained and patient reminded that in the future if she has any falls or blows to the head/injury to her head she needs to seek immediate emergency department follow-up as her use of blood thinning medication increases her risk of bleeding with injury - CT Head Wo Contrast; Future  4. Shortness of breath Patient with complaint of recent increased cough which is sent for nonproductive as well as shortness of breath.  Patient's lung exam was fairly normal with exception of decreased breath sounds at the lung bases.  Patient has been asked to obtain a chest x-ray and in case of atypical pneumonia, patient has been placed on azithromycin Z-Pak.  Patient should return for further evaluation or go to the emergency department during nonclinic hours if her shortness of breath worsens or fails to improve - DG Chest 2 View; Future -  azithromycin (ZITHROMAX) 250 MG tablet; Take 2 pills today then 1 pill daily for 4 days  Dispense: 6 tablet; Refill: 0  5. Accidental fall from bed, initial encounter Patient reports a recent accidental fall from her bed and at that time she also hit her head against her dresser beside her bed.  Patient will be sent for head CT as she is also on blood thinning medication - CT Head Wo Contrast; Future  6. Ataxia Patient reports that since her recent fall from her bed in which she also hit her head, she feels as if she is drifting when she attempts to walk straight forward.  Patient is being sent for head CT to look for any abnormalities such as subdural hematoma as patient is on blood thinning medication - CT Head Wo Contrast; Future  7. Encounter for long-term (current) use of medications Patient will have CMP in follow-up of long-term current use of medications. - Comprehensive metabolic panel  An After Visit Summary was printed and given to the  patient.  Return in about 2 weeks (around 06/17/2018) for Follow-up shortness of breath/headache.

## 2018-06-04 ENCOUNTER — Ambulatory Visit (HOSPITAL_COMMUNITY)
Admission: RE | Admit: 2018-06-04 | Discharge: 2018-06-04 | Disposition: A | Discharge status: Home / Self Care | Payer: Self-pay | Source: Ambulatory Visit | Attending: Family Medicine | Admitting: Family Medicine

## 2018-06-04 DIAGNOSIS — R0602 Shortness of breath: Secondary | ICD-10-CM | POA: Insufficient documentation

## 2018-06-04 LAB — COMPREHENSIVE METABOLIC PANEL WITH GFR
ALT: 24 IU/L (ref 0–32)
AST: 26 IU/L (ref 0–40)
Albumin/Globulin Ratio: 1.6 (ref 1.2–2.2)
Albumin: 3.9 g/dL (ref 3.6–4.8)
Alkaline Phosphatase: 106 IU/L (ref 39–117)
BUN/Creatinine Ratio: 26 (ref 12–28)
BUN: 26 mg/dL (ref 8–27)
Bilirubin Total: <0.2 mg/dL (ref 0.0–1.2)
CO2: 19 mmol/L — ABNORMAL LOW (ref 20–29)
Calcium: 8.6 mg/dL — ABNORMAL LOW (ref 8.7–10.3)
Chloride: 101 mmol/L (ref 96–106)
Creatinine, Ser: 1 mg/dL (ref 0.57–1.00)
GFR calc Af Amer: 70 mL/min/1.73
GFR calc non Af Amer: 61 mL/min/1.73
Globulin, Total: 2.4 g/dL (ref 1.5–4.5)
Glucose: 208 mg/dL — ABNORMAL HIGH (ref 65–99)
Potassium: 4.5 mmol/L (ref 3.5–5.2)
Sodium: 137 mmol/L (ref 134–144)
Total Protein: 6.3 g/dL (ref 6.0–8.5)

## 2018-06-07 ENCOUNTER — Telehealth: Payer: Self-pay | Admitting: *Deleted

## 2018-06-07 NOTE — Telephone Encounter (Signed)
-----   Message from Cain Saupeammie Fulp, MD sent at 06/04/2018 11:05 PM EST ----- Notify patient that her chest x-ray showed no acute cardiopulmonary disease

## 2018-06-07 NOTE — Telephone Encounter (Signed)
Patient verified DOB Patient is aware of labs being normal and xray also being normal. No further questions.

## 2018-06-08 ENCOUNTER — Ambulatory Visit (HOSPITAL_COMMUNITY)
Admission: RE | Admit: 2018-06-08 | Discharge: 2018-06-08 | Disposition: A | Discharge status: Home / Self Care | Payer: Medicaid Other | Source: Ambulatory Visit | Attending: Family Medicine | Admitting: Family Medicine

## 2018-06-08 DIAGNOSIS — R270 Ataxia, unspecified: Secondary | ICD-10-CM | POA: Insufficient documentation

## 2018-06-08 DIAGNOSIS — R51 Headache: Secondary | ICD-10-CM | POA: Insufficient documentation

## 2018-06-08 DIAGNOSIS — W06XXXA Fall from bed, initial encounter: Secondary | ICD-10-CM | POA: Insufficient documentation

## 2018-06-08 DIAGNOSIS — R519 Headache, unspecified: Secondary | ICD-10-CM

## 2018-06-14 ENCOUNTER — Telehealth: Payer: Self-pay | Admitting: *Deleted

## 2018-06-14 NOTE — Telephone Encounter (Signed)
Patient verified DOB Patient did receive PCP's voice message regarding the CT. No Further questions.

## 2018-06-16 DIAGNOSIS — K922 Gastrointestinal hemorrhage, unspecified: Secondary | ICD-10-CM

## 2018-06-16 HISTORY — DX: Gastrointestinal hemorrhage, unspecified: K92.2

## 2018-06-18 ENCOUNTER — Inpatient Hospital Stay (HOSPITAL_COMMUNITY)
Admission: EM | Admit: 2018-06-18 | Discharge: 2018-06-24 | DRG: 812 | Disposition: A | Discharge status: Home / Self Care | Payer: Self-pay | Attending: Internal Medicine | Admitting: Internal Medicine

## 2018-06-18 ENCOUNTER — Encounter (HOSPITAL_COMMUNITY): Payer: Self-pay | Admitting: Emergency Medicine

## 2018-06-18 ENCOUNTER — Other Ambulatory Visit: Payer: Self-pay

## 2018-06-18 ENCOUNTER — Emergency Department (HOSPITAL_COMMUNITY): Payer: Self-pay

## 2018-06-18 DIAGNOSIS — I313 Pericardial effusion (noninflammatory): Secondary | ICD-10-CM | POA: Diagnosis present

## 2018-06-18 DIAGNOSIS — E783 Hyperchylomicronemia: Secondary | ICD-10-CM

## 2018-06-18 DIAGNOSIS — I480 Paroxysmal atrial fibrillation: Secondary | ICD-10-CM | POA: Diagnosis present

## 2018-06-18 DIAGNOSIS — E872 Acidosis: Secondary | ICD-10-CM | POA: Diagnosis present

## 2018-06-18 DIAGNOSIS — Z7901 Long term (current) use of anticoagulants: Secondary | ICD-10-CM

## 2018-06-18 DIAGNOSIS — I3139 Other pericardial effusion (noninflammatory): Secondary | ICD-10-CM | POA: Diagnosis present

## 2018-06-18 DIAGNOSIS — M109 Gout, unspecified: Secondary | ICD-10-CM | POA: Diagnosis present

## 2018-06-18 DIAGNOSIS — Z833 Family history of diabetes mellitus: Secondary | ICD-10-CM

## 2018-06-18 DIAGNOSIS — Z9119 Patient's noncompliance with other medical treatment and regimen: Secondary | ICD-10-CM

## 2018-06-18 DIAGNOSIS — D500 Iron deficiency anemia secondary to blood loss (chronic): Principal | ICD-10-CM | POA: Diagnosis present

## 2018-06-18 DIAGNOSIS — D649 Anemia, unspecified: Secondary | ICD-10-CM | POA: Diagnosis present

## 2018-06-18 DIAGNOSIS — Z8249 Family history of ischemic heart disease and other diseases of the circulatory system: Secondary | ICD-10-CM

## 2018-06-18 DIAGNOSIS — I110 Hypertensive heart disease with heart failure: Secondary | ICD-10-CM | POA: Diagnosis present

## 2018-06-18 DIAGNOSIS — I5032 Chronic diastolic (congestive) heart failure: Secondary | ICD-10-CM | POA: Diagnosis present

## 2018-06-18 DIAGNOSIS — Z794 Long term (current) use of insulin: Secondary | ICD-10-CM

## 2018-06-18 DIAGNOSIS — E1169 Type 2 diabetes mellitus with other specified complication: Secondary | ICD-10-CM

## 2018-06-18 DIAGNOSIS — E669 Obesity, unspecified: Secondary | ICD-10-CM

## 2018-06-18 DIAGNOSIS — E119 Type 2 diabetes mellitus without complications: Secondary | ICD-10-CM | POA: Diagnosis present

## 2018-06-18 DIAGNOSIS — K267 Chronic duodenal ulcer without hemorrhage or perforation: Secondary | ICD-10-CM | POA: Diagnosis present

## 2018-06-18 DIAGNOSIS — I4891 Unspecified atrial fibrillation: Secondary | ICD-10-CM

## 2018-06-18 DIAGNOSIS — E781 Pure hyperglyceridemia: Secondary | ICD-10-CM | POA: Diagnosis present

## 2018-06-18 DIAGNOSIS — K2950 Unspecified chronic gastritis without bleeding: Secondary | ICD-10-CM | POA: Diagnosis present

## 2018-06-18 DIAGNOSIS — Z7982 Long term (current) use of aspirin: Secondary | ICD-10-CM

## 2018-06-18 DIAGNOSIS — E876 Hypokalemia: Secondary | ICD-10-CM | POA: Diagnosis present

## 2018-06-18 LAB — PREPARE RBC (CROSSMATCH)

## 2018-06-18 LAB — COMPREHENSIVE METABOLIC PANEL
ALT: 15 U/L (ref 0–44)
AST: 15 U/L (ref 15–41)
Albumin: 3.5 g/dL (ref 3.5–5.0)
Alkaline Phosphatase: 66 U/L (ref 38–126)
Anion gap: 13 (ref 5–15)
BUN: 30 mg/dL — ABNORMAL HIGH (ref 8–23)
CO2: 19 mmol/L — ABNORMAL LOW (ref 22–32)
Calcium: 9 mg/dL (ref 8.9–10.3)
Chloride: 101 mmol/L (ref 98–111)
Creatinine, Ser: 1.01 mg/dL — ABNORMAL HIGH (ref 0.44–1.00)
GFR calc Af Amer: >60 mL/min (ref >60)
Glucose, Bld: 351 mg/dL — ABNORMAL HIGH (ref 70–99)
Potassium: 4 mmol/L (ref 3.5–5.1)
Sodium: 133 mmol/L — ABNORMAL LOW (ref 135–145)
Total Bilirubin: 0.3 mg/dL (ref 0.3–1.2)
Total Protein: 6.7 g/dL (ref 6.5–8.1)

## 2018-06-18 LAB — CBC WITH DIFFERENTIAL/PLATELET
Abs Immature Granulocytes: 0.03 10*3/uL (ref 0.00–0.07)
Basophils Absolute: 0.1 10*3/uL (ref 0.0–0.1)
Basophils Relative: 1 %
EOS ABS: 0.2 10*3/uL (ref 0.0–0.5)
Eosinophils Relative: 2 %
HCT: 25.7 % — ABNORMAL LOW (ref 36.0–46.0)
Hemoglobin: 7.2 g/dL — ABNORMAL LOW (ref 12.0–15.0)
Immature Granulocytes: 0 %
Lymphocytes Relative: 22 %
Lymphs Abs: 2 10*3/uL (ref 0.7–4.0)
MCH: 20.7 pg — ABNORMAL LOW (ref 26.0–34.0)
MCHC: 28 g/dL — ABNORMAL LOW (ref 30.0–36.0)
MCV: 74.1 fL — ABNORMAL LOW (ref 80.0–100.0)
MONOS PCT: 6 %
Monocytes Absolute: 0.6 10*3/uL (ref 0.1–1.0)
Neutro Abs: 6.2 10*3/uL (ref 1.7–7.7)
Neutrophils Relative %: 69 %
Platelets: 300 10*3/uL (ref 150–400)
RBC: 3.47 MIL/uL — ABNORMAL LOW (ref 3.87–5.11)
RDW: 14.6 % (ref 11.5–15.5)
WBC: 9.1 10*3/uL (ref 4.0–10.5)
nRBC: 0 % (ref 0.0–0.2)

## 2018-06-18 LAB — CBG MONITORING, ED
Glucose-Capillary: 148 mg/dL — ABNORMAL HIGH (ref 70–99)
Glucose-Capillary: 131 mg/dL — ABNORMAL HIGH (ref 70–99)
Glucose-Capillary: 109 mg/dL — ABNORMAL HIGH (ref 70–99)

## 2018-06-18 LAB — IRON AND TIBC
Iron: 13 ug/dL — ABNORMAL LOW (ref 28–170)
Saturation Ratios: 2 % — ABNORMAL LOW (ref 10.4–31.8)
TIBC: 522 ug/dL — ABNORMAL HIGH (ref 250–450)
UIBC: 509 ug/dL

## 2018-06-18 LAB — RETICULOCYTES
IMMATURE RETIC FRACT: 22.2 % — AB (ref 2.3–15.9)
RBC.: 3.36 MIL/uL — ABNORMAL LOW (ref 3.87–5.11)
Retic Count, Absolute: 48 10*3/uL (ref 19.0–186.0)
Retic Ct Pct: 1.4 % (ref 0.4–3.1)

## 2018-06-18 LAB — FERRITIN: Ferritin: 4 ng/mL — ABNORMAL LOW (ref 11–307)

## 2018-06-18 LAB — POC OCCULT BLOOD, ED: Fecal Occult Bld: NEGATIVE

## 2018-06-18 LAB — FOLATE: FOLATE: 42.4 ng/mL (ref >5.9)

## 2018-06-18 LAB — ABO/RH: ABO/RH(D): O POS

## 2018-06-18 LAB — VITAMIN B12: Vitamin B-12: 610 pg/mL (ref 180–914)

## 2018-06-18 MED ORDER — ACETAMINOPHEN 325 MG PO TABS
650.0000 mg | ORAL_TABLET | Freq: Four times a day (QID) | ORAL | Status: DC | PRN
  Administered 2018-06-19 – 2018-06-24 (×3): 650 mg via ORAL
  Filled 2018-06-18 (×3): qty 2

## 2018-06-18 MED ORDER — ATORVASTATIN CALCIUM 40 MG PO TABS
40.0000 mg | ORAL_TABLET | Freq: Every day | ORAL | Status: DC
  Administered 2018-06-19 – 2018-06-24 (×6): 40 mg via ORAL
  Filled 2018-06-18 (×6): qty 1

## 2018-06-18 MED ORDER — SODIUM CHLORIDE 0.9% IV SOLUTION: Freq: Once | INTRAVENOUS | Status: DC

## 2018-06-18 MED ORDER — DILTIAZEM HCL-DEXTROSE 100-5 MG/100ML-% IV SOLN (PREMIX)
5.0000 mg/h | INTRAVENOUS | Status: DC
  Administered 2018-06-18: 5 mg/h via INTRAVENOUS
  Filled 2018-06-18: qty 100

## 2018-06-18 MED ORDER — APIXABAN 5 MG PO TABS
5.0000 mg | ORAL_TABLET | Freq: Two times a day (BID) | ORAL | Status: DC
  Administered 2018-06-18: 5 mg via ORAL
  Filled 2018-06-18: qty 1

## 2018-06-18 MED ORDER — FERROUS SULFATE 325 (65 FE) MG PO TABS
325.0000 mg | ORAL_TABLET | Freq: Two times a day (BID) | ORAL | Status: DC
  Administered 2018-06-18 – 2018-06-19 (×2): 325 mg via ORAL
  Filled 2018-06-18 (×3): qty 1

## 2018-06-18 MED ORDER — DICYCLOMINE HCL 20 MG PO TABS
20.0000 mg | ORAL_TABLET | Freq: Two times a day (BID) | ORAL | Status: DC
  Administered 2018-06-18 – 2018-06-24 (×12): 20 mg via ORAL
  Filled 2018-06-18 (×12): qty 1

## 2018-06-18 MED ORDER — SODIUM CHLORIDE 0.9% IV SOLUTION
Freq: Once | INTRAVENOUS | Status: AC
  Administered 2018-06-18: 10 mL/h via INTRAVENOUS

## 2018-06-18 MED ORDER — FAMOTIDINE 20 MG PO TABS
20.0000 mg | ORAL_TABLET | Freq: Two times a day (BID) | ORAL | Status: DC
  Administered 2018-06-18 – 2018-06-20 (×4): 20 mg via ORAL
  Filled 2018-06-18 (×4): qty 1

## 2018-06-18 MED ORDER — INSULIN ASPART 100 UNIT/ML ~~LOC~~ SOLN
0.0000 [IU] | SUBCUTANEOUS | Status: DC
  Administered 2018-06-19: 3 [IU] via SUBCUTANEOUS
  Administered 2018-06-20: 2 [IU] via SUBCUTANEOUS
  Administered 2018-06-21 (×3): 1 [IU] via SUBCUTANEOUS
  Administered 2018-06-21 – 2018-06-22 (×2): 2 [IU] via SUBCUTANEOUS
  Administered 2018-06-22: 1 [IU] via SUBCUTANEOUS
  Administered 2018-06-22 (×2): 3 [IU] via SUBCUTANEOUS
  Administered 2018-06-22: 1 [IU] via SUBCUTANEOUS
  Administered 2018-06-23: 2 [IU] via SUBCUTANEOUS
  Administered 2018-06-23: 3 [IU] via SUBCUTANEOUS
  Administered 2018-06-23: 2 [IU] via SUBCUTANEOUS
  Administered 2018-06-23 – 2018-06-24 (×2): 1 [IU] via SUBCUTANEOUS
  Administered 2018-06-24: 2 [IU] via SUBCUTANEOUS
  Administered 2018-06-24: 3 [IU] via SUBCUTANEOUS

## 2018-06-18 MED ORDER — INSULIN GLARGINE 100 UNIT/ML ~~LOC~~ SOLN
10.0000 [IU] | Freq: Every day | SUBCUTANEOUS | Status: DC
  Administered 2018-06-18 – 2018-06-23 (×6): 10 [IU] via SUBCUTANEOUS
  Filled 2018-06-18 (×7): qty 0.1

## 2018-06-18 MED ORDER — FENOFIBRATE 160 MG PO TABS
160.0000 mg | ORAL_TABLET | Freq: Every day | ORAL | Status: DC
  Administered 2018-06-18 – 2018-06-24 (×7): 160 mg via ORAL
  Filled 2018-06-18 (×7): qty 1

## 2018-06-18 MED ORDER — METOPROLOL TARTRATE 5 MG/5ML IV SOLN
2.5000 mg | Freq: Once | INTRAVENOUS | Status: AC
  Administered 2018-06-18: 2.5 mg via INTRAVENOUS
  Filled 2018-06-18: qty 5

## 2018-06-18 MED ORDER — ASPIRIN EC 81 MG PO TBEC
81.0000 mg | DELAYED_RELEASE_TABLET | Freq: Every day | ORAL | Status: DC
  Administered 2018-06-19: 81 mg via ORAL
  Filled 2018-06-18: qty 1

## 2018-06-18 MED ORDER — INSULIN GLARGINE 100 UNIT/ML SOLOSTAR PEN: 10.0000 [IU] | PEN_INJECTOR | Freq: Every day | SUBCUTANEOUS | Status: DC

## 2018-06-18 NOTE — ED Notes (Signed)
Call made to pharmacy to get orders released so I can give them

## 2018-06-18 NOTE — ED Notes (Signed)
Waiting for a room.  Still  Unable to eincrase the cafdizem drip  bp is running low heart rate getting more regular  But the pt is still in af

## 2018-06-18 NOTE — ED Notes (Signed)
1st unit PRBC's started

## 2018-06-18 NOTE — ED Notes (Signed)
Medications not verified by pharmacy

## 2018-06-18 NOTE — ED Notes (Signed)
The pt is feeling ok

## 2018-06-18 NOTE — ED Triage Notes (Signed)
Pt arrived via [facility], ems reports patient called out initially for sob and dizziness, found to have HR of 160-180, afib rvr with Hx of a fib. Patient received 5mg  metoprolol and 100cc ns. Pt a/ox4, resp e/u, skin pale and dry.    EMS VS: BP 120/90, HR 80-130, cbg 124, 12 lead unremarkable.

## 2018-06-18 NOTE — ED Notes (Signed)
Some meds have been verified others have not

## 2018-06-18 NOTE — H&P (Signed)
History and Physical  Emily Farmer DXI:[id number] DOB: December 06, 1955 DOA: 06/18/2018  Referring physician: Dr Alver Sorrow  PCP: Antony Blackbird, MD  Outpatient Specialists: Cardiology Patient coming from: Home  Chief Complaint: Lightheadedness and shortness of breath  HPI: Emily Farmer is a 63 y.o. female with medical history significant for paroxysmal A. fib on Eliquis, diastolic CHF, type 2 diabetes, hyperlipidemia who presented to [facility] ED via EMS with complaints of shortness of breath and lightheadedness of a few hours duration.  She was working at [employer] when suddenly started feeling significantly short of breath.  Associated with lightheadedness.  No falls.  States she has had similar symptoms in the past when she was initially diagnosed with A. fib a year ago.  Admits to compliance with her medications.  Denies any chest pain.  She was in her usual state of health prior to this.  Denies lower extremity edema or orthopnea.  No improving or worsening factors.  Reports a fall at home about 2 weeks ago, hit her head.  Self reports had head CT which was unremarkable.  Admits to occasional dark stool but states she she takes iron supplement.  First colonoscopy was a year ago, seen by [facility].  ED Course: On presentation to the ED patient is in A. fib RVR.  Was given a dose of Lopressor with improvement but recurrence.  Low hemoglobin 7.2 with baseline hemoglobin of 10 in September 2019.  Negative FOBT.  Lab studies remarkable for profound iron deficiency.  TRH is asked to admit.  Review of Systems: Review of systems as noted in the HPI. All other systems reviewed and are negative.   Past Medical History:  Diagnosis Date  . Arthritis   . Atrial fibrillation ([place])    a. 09/2016 in setting of pancreatitis;  b. 09/2016 Echo: EF 65-60%, no rwma, Gr2 DD, mild MR, triv TR, PASP 48mHg;  CHA2DS2VASc = 4-->Eliquis 552mBID.  . Marland Kitchenhronic diastolic CHF (congestive heart failure) [place]  . Diabetes  mellitus   . Gout   . Hyperlipidemia   . Hypertension   . Hypertriglyceridemia   . Pancreatitis    a. 09/2016 - Triglycerides 1,392 on admission.   Past Surgical History:  Procedure Laterality Date  . CHOLECYSTECTOMY N/A 01/04/2014   Procedure: LAPAROSCOPIC CHOLECYSTECTOMY WITH INTRAOPERATIVE CHOLANGIOGRAM;  Surgeon: ArRalene OkMD;  [facility] Service: General;  Laterality: N/A;  . LUNG SURGERY      Social History:  reports that she has never smoked. She has never used smokeless tobacco. She reports that she does not drink alcohol or use drugs.   No Known Allergies  Family History  Problem Relation Age of Onset  . Heart attack Mother   . Heart attack Father   . Diabetes Sister   . High blood pressure Neg Hx   . High Cholesterol Neg Hx       Prior to Admission medications   Medication Sig Start Date End Date Taking? Authorizing Provider  acetaminophen (TYLENOL) 325 MG tablet Take 2 tablets (650 mg total) by mouth every 6 (six) hours as needed for mild pain (or Fever >/= 101). 02/01/18  Yes Elgergawy, DaSilver HugueninMD  apixaban (ELIQUIS) 5 MG TABS tablet Take 1 tablet (5 mg total) by mouth 2 (two) times daily. 05/11/18  Yes HiPixie CasinoMD  aspirin EC 81 MG tablet Take 1 tablet (81 mg total) by mouth daily. 02/01/18  Yes Elgergawy, DaSilver HugueninMD  atorvastatin (LIPITOR) 40 MG tablet  Take 1 tablet (40 mg total) by mouth daily. 05/11/18  Yes Hilty, Nadean Corwin, MD  cetirizine (ZYRTEC) 10 MG tablet Take 1 tablet (10 mg total) by mouth daily. 04/05/18  Yes Charlott Rakes, MD  dicyclomine (BENTYL) 20 MG tablet Take 1 tablet (20 mg total) by mouth 2 (two) times daily. 03/14/18  Yes Joy, Shawn C, PA-C  fenofibrate 160 MG tablet Take 1 tablet (160 mg total) by mouth daily. 05/11/18  Yes Hilty, Nadean Corwin, MD  hydroxypropyl methylcellulose / hypromellose (ISOPTO TEARS / GONIOVISC) 2.5 % ophthalmic solution Place 1 drop into both eyes as needed for dry eyes.   Yes [provider]  Insulin Glargine (LANTUS SOLOSTAR) 100 UNIT/ML Solostar Pen Inject 25 units daily. If sugars are > 130 after 3 days, increase to 28 units. If they remain elevated, increase to 30 units. 05/06/18  Yes Fulp, Cammie, MD  metFORMIN (GLUCOPHAGE) 500 MG tablet Take 2 tablets (1,000 mg total) by mouth 2 (two) times daily with a meal. 03/22/18  Yes Fulp, Cammie, MD  metoprolol tartrate (LOPRESSOR) 25 MG tablet Take 1.5 tablets (37.5 mg total) by mouth 2 (two) times daily. 03/09/18  Yes Almyra Deforest, PA  Multiple Vitamins-Minerals (CENTRUM SILVER PO) Take 1 tablet by mouth daily.   Yes [provider]  mupirocin ointment (BACTROBAN) 2 % Place 1 application into the nose 2 (two) times daily. Patient taking differently: Place 1 application into the nose 2 (two) times daily as needed.  02/01/18  Yes Elgergawy, Silver Huguenin, MD  omega-3 acid ethyl esters (LOVAZA) 1 g capsule Take 2 capsules (2 g total) by mouth 2 (two) times daily. 02/01/18  Yes Elgergawy, Silver Huguenin, MD  ondansetron (ZOFRAN ODT) 4 MG disintegrating tablet Take 1 tablet (4 mg total) by mouth every 8 (eight) hours as needed for nausea or vomiting. 03/14/18  Yes Joy, Shawn C, PA-C  promethazine (PHENERGAN) 25 MG suppository Place 1 suppository (25 mg total) rectally every 8 (eight) hours as needed for nausea or vomiting. 04/05/18  Yes Charlott Rakes, MD  azithromycin (ZITHROMAX) 250 MG tablet Take 2 pills today then 1 pill daily for 4 days Patient not taking: Reported on 06/18/2018 06/03/18   Fulp, Ander Gaster, MD  Blood Glucose Monitoring Suppl (TRUE METRIX METER) w/Device KIT Use to monitor blood sugar as directed 02/22/18   Fulp, Cammie, MD  famotidine (PEPCID) 20 MG tablet Take 1 tablet (20 mg total) by mouth 2 (two) times daily for 5 days. 03/14/18 03/19/18  Joy, Shawn C, PA-C  glucose blood (TRUE METRIX BLOOD GLUCOSE TEST) test strip Use as instructed to check blood sugars 3 times per day 02/22/18   Fulp, Cammie, MD  Insulin Pen Needle  (TRUEPLUS PEN NEEDLES) 32G X 4 MM MISC Use with insulin pen to inject Lantus daily. 03/22/18   Fulp, Cammie, MD  Insulin Syringe-Needle U-100 (TRUEPLUS INSULIN SYRINGE) 31G X 5/16" 0.3 ML MISC Use to inject insulin daily. 05/06/18   Fulp, Cammie, MD  pantoprazole (PROTONIX) 40 MG tablet Take 1 tablet (40 mg total) by mouth daily. Patient not taking: Reported on 06/18/2018 02/22/18   Fulp, Ander Gaster, MD  sulfamethoxazole-trimethoprim (BACTRIM DS) 800-160 MG tablet Take 1 tablet by mouth 2 (two) times daily. Patient not taking: Reported on 06/18/2018 04/05/18   Charlott Rakes, MD  TRUEPLUS LANCETS 28G MISC Use to check blood sugar 3 times per day 02/22/18   Antony Blackbird, MD    Physical Exam: BP 108/68   Pulse 92   Temp 98.3  F (36.8 C) (Oral)   Resp (!) 21   SpO2 96%   . General: 63 y.o. year-old female well developed well nourished in no acute distress.  Alert and oriented x3. . Cardiovascular: Regular rate and rhythm with no rubs or gallops.  No thyromegaly or JVD noted.  No lower extremity edema. 2/4 pulses in all 4 extremities. Marland Kitchen Respiratory: Clear to auscultation with no wheezes or rales. Good inspiratory effort. . Abdomen: Soft nontender nondistended with normal bowel sounds x4 quadrants. . Muskuloskeletal: No cyanosis, clubbing or edema noted bilaterally . Neuro: CN II-XII intact, strength, sensation, reflexes . Skin: No ulcerative lesions noted or rashes.  . Psychiatry: Judgement and insight appear normal. Mood is appropriate for condition and setting          Labs on Admission:  Basic Metabolic Panel: Recent Labs  Lab 06/18/18 0806  NA 133*  K 4.0  CL 101  CO2 19*  GLUCOSE 351*  BUN 30*  CREATININE 1.01*  CALCIUM 9.0   Liver Function Tests: Recent Labs  Lab 06/18/18 0806  AST 15  ALT 15  ALKPHOS 66  BILITOT 0.3  PROT 6.7  ALBUMIN 3.5   No results for input(s): LIPASE, AMYLASE in the last 168 hours. No results for input(s): AMMONIA in the last 168 hours. CBC: Recent  Labs  Lab 06/18/18 0806  WBC 9.1  NEUTROABS 6.2  HGB 7.2*  HCT 25.7*  MCV 74.1*  PLT 300   Cardiac Enzymes: No results for input(s): CKTOTAL, CKMB, CKMBINDEX, TROPONINI in the last 168 hours.  BNP (last 3 results) No results for input(s): BNP in the last 8760 hours.  ProBNP (last 3 results) No results for input(s): PROBNP in the last 8760 hours.  CBG: No results for input(s): GLUCAP in the last 168 hours.  Radiological Exams on Admission: Dg Chest Portable 1 View  Result Date: 06/18/2018 CLINICAL DATA:  Shortness of breath. Atrial fibrillation. EXAM: PORTABLE CHEST 1 VIEW COMPARISON:  06/04/2018 FINDINGS: The heart size and pulmonary vascularity are normal. No infiltrates or effusions. Chronic blunting of the right costophrenic angle laterally. Surgical clips in the right lung. Bones are normal. IMPRESSION: No acute cardiopulmonary abnormality. Electronically Signed   By: Lorriane Shire M.D.   On: 06/18/2018 08:44    EKG: I independently viewed the EKG done and my findings are as followed: Tachycardia rate of 109.  No specific ST-T changes.  Assessment/Plan Present on Admission: . Symptomatic anemia  Active Problems:   Symptomatic anemia   Symptomatic anemia/iron deficiency anemia Presented with hemoglobin of 7.2 with baseline hemoglobin of 10.2 and 02/22/2018 FOBT negative Profound iron deficiency from iron studies Start ferrous sulfate 325 twice daily Transfuse 2 unit PRBCs Repeat CBC in the morning Maintain O2 saturation greater than 92%  Symptomatic A. fib with RVR Reports dyspnea with minimal movement in Start Cardizem drip and titrate as needed Monitor vital signs on telemetry unit Cardiology consulted to further assess On Eliquis for primary CVA prophylaxis Resume p.o. Lopressor 37.5 mg twice daily  Type 2 diabetes Last A1c done in November 2019 revealed A1c of 9.8 Start insulin sliding scale Resume long-acting insulin at half dose N.p.o. until  cardiology assesses  Chronic diastolic CHF Last 2D echo done in 10/04/2016 Revealed preserved LVEF and grade 2 diastolic dysfunction Resume cardiac medications Strict I's and O's and daily weight  Hyperlipidemia Resume home medications LDL 105 on 03/22/2018  Anion gap metabolic acidosis Anion gap of 13, chemistry glucose 351 Chemistry bicarb of 19  Repeat BMP in the morning Closely monitor  Pseudohyponatremia suspect secondary to hyperglycemia Sodium 133 and glucose 351 Repeat BMP in the morning   Risks: Patient is high risk for decompensation due to symptomatic anemia and symptomatic A. fib with RVR, multiple comorbidities and advanced age.  Patient will require at least 2 midnights for further evaluation and treatment of present condition.  DVT prophylaxis: Eliquis  Code Status: Full code  Family Communication: None at bedside  Disposition Plan: Admit to telemetry unit  Consults called: Cardiology  Admission status: Inpatient status    Kayleen Memos MD [facility] Pager [phone]  If 7PM-7AM, please contact night-coverage [email or web address] Password [facility]  06/18/2018, 3:33 PM

## 2018-06-18 NOTE — ED Notes (Signed)
np called back  For bed change for  This pt titrating cardizem

## 2018-06-18 NOTE — Consult Note (Signed)
Admit date: 06/18/2018 Referring Physician  Dr. Dow Adolph Primary Physician  Dr. Cain Saupe Primary Cardiologist  Dr. Zoila Shutter Reason for Consultation  Atrial fibrillation and SOB  HPI: Emily Farmer is a 63 y.o. female who is being seen today for the evaluation of atrial fibrillation at the request of Burman Foster, MD.  Is a very delightful 64 year old female who has a history of paroxysmal atrial fibrillation initially diagnosed in the setting of acute pancreatitis/2018 at which time echo showed normal LV function with EF 60 to 65% with grade 2 diastolic dysfunction.  She was started on Eliquis for aCHADS2VASC score of 4.  She also had has a history of diabetes mellitus and takes insulin and metformin.  She is on Eliquis for stroke prevention for her PAF.  She also has hyperlipidemia and marked hypertriglyceridemia with triglycerides as high as 959 in the past.  She has a history of medical noncompliance specially in taking her cholesterol medications but this is improved after getting Medicaid.  She is on diuretics for chronic diastolic heart failure.  She did require cardioversion for atrial fibrillation this past summer but since then has been fairly well controlled on medical therapy.  She was in her usual state of health until earlier today while at the laund[employer]at she started feeling short of breath and became lightheaded.  She then noticed that her heart was skipping around and racing.  She says these are symptoms similar to what she had when she was diagnosed with atrial fibrillation.  She has been compliant with her medications.  She denied any nausea, diaphoresis but did have shortness of breath and dizziness.  She has not had any recent travel and denies any lower extremity edema, PND orthopnea.  Currently she fell 2 weeks ago at home and hit her head but had a CT of her head that was reportedly negative.  In the ER she was found to have hemoglobin of 7.2 with a baseline of 10.   She says she had some dark stools last week but she takes an iron supplement.  She was found in the ER to have A. fib with RVR and was given a dose of Lopressor with conversion to sinus rhythm.  On telemetry she continues to have episodic paroxysmal atrial fibrillation for short bursts and then breaks out of it into sinus rhythm with PACs.  Audiology is now asked to consult for help with her atrial fibrillation. I        PMH:   Past Medical History:  Diagnosis Date  . Arthritis   . Atrial fibrillation ([facility])    a. 09/2016 in setting of pancreatitis;  b. 09/2016 Echo: EF 65-60%, no rwma, Gr2 DD, mild MR, triv TR, PASP ;  CHA2DS2VASc = 4-->Eliquis 5mg  BID.  Marland Kitchen Chronic diastolic CHF (congestive heart failure) ([facility]) 01/28/2018  . Diabetes mellitus   . Gout   . Hyperlipidemia   . Hypertension   . Hypertriglyceridemia   . Pancreatitis    a. 09/2016 - Triglycerides 1,392 on admission.     PSH:   Past Surgical History:  Procedure Laterality Date  . CHOLECYSTECTOMY N/A 01/04/2014   Procedure: LAPAROSCOPIC CHOLECYSTECTOMY WITH INTRAOPERATIVE CHOLANGIOGRAM;  Surgeon: Axel Filler, MD;  Location: [facility] OR;  Service: General;  Laterality: N/A;  . LUNG SURGERY      Allergies:  Patient has no known allergies. Prior to Admit Meds:  (Not in a hospital admission)  Fam HX:    Family History  Problem Relation  Age of Onset  . Heart attack Mother   . Heart attack Father   . Diabetes Sister   . High blood pressure Neg Hx   . High Cholesterol Neg Hx    Social HX:    Social History   Socioeconomic History  . Marital status: Single    Spouse name: Not on file  . Number of children: Not on file  . Years of education: Not on file  . Highest education level: Not on file  Occupational History  . Not on file  Social Needs  . Financial resource strain: Not on file  . Food insecurity:    Worry: Not on file    Inability: Not on file  . Transportation needs:    Medical: Not on file     Non-medical: Not on file  Tobacco Use  . Smoking status: Never Smoker  . Smokeless tobacco: Never Used  Substance and Sexual Activity  . Alcohol use: No  . Drug use: No  . Sexual activity: Not Currently  Lifestyle  . Physical activity:    Days per week: Not on file    Minutes per session: Not on file  . Stress: Not on file  Relationships  . Social connections:    Talks on phone: Not on file    Gets together: Not on file    Attends religious service: Not on file    Active member of club or organization: Not on file    Attends meetings of clubs or organizations: Not on file    Relationship status: Not on file  . Intimate partner violence:    Fear of current or ex partner: Not on file    Emotionally abused: Not on file    Physically abused: Not on file    Forced sexual activity: Not on file  Other Topics Concern  . Not on file  Social History Narrative   Lives with her sister and does not need any assistance with ADLs.       ROS:  All  ROS were addressed and are negative except what is stated in the HPI  Physical Exam: Blood pressure (!) 112/57, pulse (!) 121, temperature 98.7 F (37.1 C), resp. rate 17, SpO2 97 %.    General: Well developed, well nourished, in no acute distress Head: Eyes PERRLA, No xanthomas.   Normal cephalic and atramatic  Lungs:   Clear bilaterally to auscultation and percussion. Heart:   HRRR short bursts of irregular rapid rhythm S1 S2 Pulses are 2+ & equal.            No carotid bruit. No JVD.  No abdominal bruits. No femoral bruits. Abdomen: Bowel sounds are positive, abdomen soft and non-tender without masses or                  Hernia's noted. Msk:  Back normal, normal gait. Normal strength and tone for age. Extremities:   No clubbing, cyanosis or edema.  DP +1 Neuro: Alert and oriented X 3. Psych:  Good affect, responds appropriately  Labs:   Lab Results  Component Value Date   WBC 9.1 06/18/2018   HGB 7.2 (L) 06/18/2018   HCT 25.7 (L)  06/18/2018   MCV 74.1 (L) 06/18/2018   PLT 300 06/18/2018    Recent Labs  Lab 06/18/18 0806  NA 133*  K 4.0  CL 101  CO2 19*  BUN 30*  CREATININE 1.01*  CALCIUM 9.0  PROT 6.7  BILITOT 0.3  ALKPHOS  66  ALT 15  AST 15  GLUCOSE 351*   No results found for: PTT Lab Results  Component Value Date   INR 1.03 10/05/2016   Lab Results  Component Value Date   CKTOTAL 59 06/19/2011   CKMB 2.2 06/19/2011   TROPONINI <0.30 06/19/2011     Lab Results  Component Value Date   CHOL 187 03/22/2018   CHOL 343 (H) 05/04/2017   CHOL 396 (H) 11/21/2016   Lab Results  Component Value Date   HDL 49 03/22/2018   HDL 41 05/04/2017   HDL 45 (L) 11/21/2016   Lab Results  Component Value Date   LDLCALC 105 (H) 03/22/2018   LDLCALC Comment 05/04/2017   LDLCALC NOT CALC 11/21/2016   Lab Results  Component Value Date   TRIG 163 (H) 03/22/2018   TRIG 816 (HH) 05/04/2017   TRIG 959 (H) 11/21/2016   Lab Results  Component Value Date   CHOLHDL 3.8 03/22/2018   CHOLHDL 8.4 (H) 05/04/2017   CHOLHDL 8.8 (H) 11/21/2016   Lab Results  Component Value Date   LDLDIRECT 211 (H) 05/04/2017   LDLDIRECT 170 (H) 11/21/2016   LDLDIRECT 217 (H) 10/06/2016      Radiology:  Dg Chest Portable 1 View  Result Date: 06/18/2018 CLINICAL DATA:  Shortness of breath. Atrial fibrillation. EXAM: PORTABLE CHEST 1 VIEW COMPARISON:  06/04/2018 FINDINGS: The heart size and pulmonary vascularity are normal. No infiltrates or effusions. Chronic blunting of the right costophrenic angle laterally. Surgical clips in the right lung. Bones are normal. IMPRESSION: No acute cardiopulmonary abnormality. Electronically Signed   By: Francene Boyers M.D.   On: 06/18/2018 08:44   Telemetry    Sinus rhythm with short bursts of paroxysmal atrial fibrillation- Personally Reviewed  ECG    Sinus rhythm with frequent PVCs and ventricular couplets - Personally Reviewed   ASSESSMENT/PLAN:   1.  Atrial fibrillation with  RVR -She has a history of atrial fibrillation in the past requiring cardioversion last summer -Currently in sinus rhythm with PACs and short bursts of nonsustained atrial fibrillation -Question whether her severe anemia is driving this -Currently on IV Cardizem drip -BP to soft to restart her Lopressor that she takes at home -If she continues to have A. fib that cannot be controlled continues to have soft BPs may need to consider IV amiodarone -She is chronically anticoagulated on Eliquis 5 mg twice daily for CHADS2VASC score of 4.    2.  Shortness of breath -Suspect this is multifactorial due to severe anemia as well as paroxysmal atrial fibrillation.  She says she has had shortness of breath in the past whenever she gets A. Fib. -Last echo in April 2018 showed vigorous LV function with EF 65 to 70%. -Repeat 2D echo to reassess LV function  3.  Severe anemia -Etiology unknown -he says she has had some dark stools in the past week but she has been taking iron supplements. -FOBT is negative -Iron studies reveal iron of 13 iron saturation of 2%.  Her ferritin is very low at 4. -Would recommend getting GI involved -Her Eliquis has not been discontinued and given her markedly low ferritin and iron levels may need to consider stopping Eliquis in the interim.  Will defer to GI and [facility] on this since she is FOBT negative -She is currently getting PRBCs per [facility]  4.  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 -Continue fenofibrate 160  mg daily, atorvastatin 40 mg daily and Lovaza 2 g twice daily.  Armanda Magicraci Onyx Schirmer, MD  06/18/2018  4:31 PM

## 2018-06-18 NOTE — ED Provider Notes (Addendum)
[place] EMERGENCY DEPARTMENT Provider Note   CSN: [id number] Arrival date & time: 06/18/18  0744     History   Chief Complaint Chief Complaint  Patient presents with  . Atrial Fibrillation    HPI Emily Farmer is a 63 y.o. female.  HPI   63 year old female with a history of atrial fibrillation on Eliquis, chronic diastolic CHF, diabetes, hypertension, hyperlipidemia, hypertriglyceridemia, pancreatitis who presents with concern for shortness of breath, lightheadedness with atrial fibrillation with RVR noted on EMS ECG.  Given metoprolol 21m with improvement in HR, however reports continuing symptoms of lightheadedness and fatigue. Dyspnea for the most part has diminished. No chest pain. No fevers, cough or recent illness.  No change in medications.  Reprots she has been taking eliquis consistently for the last 2 weeks however was out of the medication prior to that and had missed doses.  Reports black stool x2 last week but no stool since then. No other symptoms of bleeding.  Past Medical History:  Diagnosis Date  . Arthritis   . Atrial fibrillation ([place]    a. 09/2016 in setting of pancreatitis;  b. 09/2016 Echo: EF 65-60%, no rwma, Gr2 DD, mild MR, triv TR, PASP 350mg;  CHA2DS2VASc = 4-->Eliquis 22m77mID.  . CMarland Kitchenronic diastolic CHF (congestive heart failure) (HCC[place]/15/2019  . Diabetes mellitus   . Gout   . Hyperlipidemia   . Hypertension   . Hypertriglyceridemia   . Pancreatitis    a. 09/2016 - Triglycerides 1,392 on admission.    Patient Active Problem List   Diagnosis Date Noted  . Symptomatic anemia 06/18/2018  . Hyperosmolar non-ketotic state in patient with type 2 diabetes mellitus (HCCMikes8/16/2019  . Hyperglycemic hyperosmolar nonketotic coma (HCCMi[place]8/15/2019  . Atrial fibrillation with RVR (HCCGarland8/15/2019  . Non-compliance 01/28/2018  . Hyponatremia 01/28/2018  . Normocytic anemia 01/28/2018  . Chronic diastolic CHF (congestive heart  failure) (HCCCarlstadt8/15/2019  . Labial abscess 01/28/2018  . Orthostatic hypotension 01/28/2018  . Mixed hyperlipidemia 06/01/2017  . Acute pancreatitis 10/04/2016  . Paroxysmal A-fib (HCCRiver Forest4/21/2018  . Cholecystitis, acute with cholelithiasis 01/04/2014  . Hypokalemia 01/04/2014  . Pyelonephritis 02/15/2013  . Dehydration 02/15/2013  . Hyperglycemia 02/15/2013  . Increased anion gap metabolic acidosis 09/57/06/7791 Essential hypertension 02/15/2013  . Hypertriglyceridemia 02/15/2013  . Diabetes mellitus type 2 in obese (HCCNarberth9/07/2012  . Coronary atherosclerosis seen on CT 02/15/2013  . Nausea and vomiting 02/15/2013  . Diarrhea 02/15/2013    Past Surgical History:  Procedure Laterality Date  . CHOLECYSTECTOMY N/A 01/04/2014   Procedure: LAPAROSCOPIC CHOLECYSTECTOMY WITH INTRAOPERATIVE CHOLANGIOGRAM;  Surgeon: ArmRalene OkD;  [facility];  Laterality: N/A;  . LUNG SURGERY       OB History   No obstetric history on file.      Home Medications    Prior to Admission medications   Medication Sig Start Date End Date Taking? Authorizing Provider  acetaminophen (TYLENOL) 325 MG tablet Take 2 tablets (650 mg total) by mouth every 6 (six) hours as needed for mild pain (or Fever >/= 101). 02/01/18  Yes Elgergawy, DawSilver HugueninD  apixaban (ELIQUIS) 5 MG TABS tablet Take 1 tablet (5 mg total) by mouth 2 (two) times daily. 05/11/18  Yes HilPixie CasinoD  aspirin EC 81 MG tablet Take 1 tablet (81 mg total) by mouth daily. 02/01/18  Yes Elgergawy, DawSilver HugueninD  atorvastatin (LIPITOR) 40 MG tablet Take 1 tablet (40 mg  total) by mouth daily. 05/11/18  Yes Hilty, Nadean Corwin, MD  cetirizine (ZYRTEC) 10 MG tablet Take 1 tablet (10 mg total) by mouth daily. 04/05/18  Yes Charlott Rakes, MD  dicyclomine (BENTYL) 20 MG tablet Take 1 tablet (20 mg total) by mouth 2 (two) times daily. 03/14/18  Yes Joy, Shawn C, PA-C  fenofibrate 160 MG tablet Take 1 tablet (160 mg total)  by mouth daily. 05/11/18  Yes Hilty, Nadean Corwin, MD  hydroxypropyl methylcellulose / hypromellose (ISOPTO TEARS / GONIOVISC) 2.5 % ophthalmic solution Place 1 drop into both eyes as needed for dry eyes.   Yes [provider]  Insulin Glargine (LANTUS SOLOSTAR) 100 UNIT/ML Solostar Pen Inject 25 units daily. If sugars are > 130 after 3 days, increase to 28 units. If they remain elevated, increase to 30 units. 05/06/18  Yes Fulp, Cammie, MD  metFORMIN (GLUCOPHAGE) 500 MG tablet Take 2 tablets (1,000 mg total) by mouth 2 (two) times daily with a meal. 03/22/18  Yes Fulp, Cammie, MD  metoprolol tartrate (LOPRESSOR) 25 MG tablet Take 1.5 tablets (37.5 mg total) by mouth 2 (two) times daily. 03/09/18  Yes Almyra Deforest, PA  Multiple Vitamins-Minerals (CENTRUM SILVER PO) Take 1 tablet by mouth daily.   Yes [provider]  mupirocin ointment (BACTROBAN) 2 % Place 1 application into the nose 2 (two) times daily. Patient taking differently: Place 1 application into the nose 2 (two) times daily as needed.  02/01/18  Yes Elgergawy, Silver Huguenin, MD  omega-3 acid ethyl esters (LOVAZA) 1 g capsule Take 2 capsules (2 g total) by mouth 2 (two) times daily. 02/01/18  Yes Elgergawy, Silver Huguenin, MD  ondansetron (ZOFRAN ODT) 4 MG disintegrating tablet Take 1 tablet (4 mg total) by mouth every 8 (eight) hours as needed for nausea or vomiting. 03/14/18  Yes Joy, Shawn C, PA-C  promethazine (PHENERGAN) 25 MG suppository Place 1 suppository (25 mg total) rectally every 8 (eight) hours as needed for nausea or vomiting. 04/05/18  Yes Charlott Rakes, MD  azithromycin (ZITHROMAX) 250 MG tablet Take 2 pills today then 1 pill daily for 4 days Patient not taking: Reported on 06/18/2018 06/03/18   Fulp, Ander Gaster, MD  Blood Glucose Monitoring Suppl (TRUE METRIX METER) w/Device KIT Use to monitor blood sugar as directed 02/22/18   Fulp, Cammie, MD  famotidine (PEPCID) 20 MG tablet Take 1 tablet (20 mg total) by mouth 2 (two) times daily  for 5 days. 03/14/18 03/19/18  Joy, Shawn C, PA-C  glucose blood (TRUE METRIX BLOOD GLUCOSE TEST) test strip Use as instructed to check blood sugars 3 times per day 02/22/18   Fulp, Cammie, MD  Insulin Pen Needle (TRUEPLUS PEN NEEDLES) 32G X 4 MM MISC Use with insulin pen to inject Lantus daily. 03/22/18   Fulp, Cammie, MD  Insulin Syringe-Needle U-100 (TRUEPLUS INSULIN SYRINGE) 31G X 5/16" 0.3 ML MISC Use to inject insulin daily. 05/06/18   Fulp, Cammie, MD  pantoprazole (PROTONIX) 40 MG tablet Take 1 tablet (40 mg total) by mouth daily. Patient not taking: Reported on 06/18/2018 02/22/18   Fulp, Ander Gaster, MD  sulfamethoxazole-trimethoprim (BACTRIM DS) 800-160 MG tablet Take 1 tablet by mouth 2 (two) times daily. Patient not taking: Reported on 06/18/2018 04/05/18   Charlott Rakes, MD  TRUEPLUS LANCETS 28G MISC Use to check blood sugar 3 times per day 02/22/18   Antony Blackbird, MD    Family History Family History  Problem Relation Age of Onset  . Heart attack Mother   .  Heart attack Father   . Diabetes Sister   . High blood pressure Neg Hx   . High Cholesterol Neg Hx     Social History Social History   Tobacco Use  . Smoking status: Never Smoker  . Smokeless tobacco: Never Used  Substance Use Topics  . Alcohol use: No  . Drug use: No     Allergies   Patient has no known allergies.   Review of Systems Review of Systems  Constitutional: Positive for fatigue. Negative for fever.  HENT: Negative for sore throat.   Eyes: Negative for visual disturbance.  Respiratory: Positive for shortness of breath. Negative for cough.   Cardiovascular: Negative for chest pain.  Gastrointestinal: Negative for abdominal pain, diarrhea, nausea and vomiting. Blood in stool: last week black stool no return.  Genitourinary: Negative for difficulty urinating and vaginal bleeding.  Musculoskeletal: Negative for back pain and neck pain.  Skin: Negative for rash.  Neurological: Positive for light-headedness.  Negative for syncope and headaches.     Physical Exam Updated Vital Signs BP (!) 139/126   Pulse 96   Temp 98.4 F (36.9 C)   Resp (!) 24   SpO2 97%   Physical Exam Vitals signs and nursing note reviewed.  Constitutional:      General: She is not in acute distress.    Appearance: She is well-developed. She is not diaphoretic.  HENT:     Head: Normocephalic and atraumatic.  Eyes:     Conjunctiva/sclera: Conjunctivae normal.  Neck:     Musculoskeletal: Normal range of motion.  Cardiovascular:     Rate and Rhythm: Normal rate. Rhythm regularly irregular.     Heart sounds: Normal heart sounds. No murmur. No friction rub. No gallop.   Pulmonary:     Effort: Pulmonary effort is normal. No respiratory distress.     Breath sounds: Normal breath sounds. No wheezing or rales.  Abdominal:     General: There is no distension.     Palpations: Abdomen is soft.     Tenderness: There is no abdominal tenderness. There is no guarding.  Musculoskeletal:        General: No tenderness.  Skin:    General: Skin is warm and dry.     Findings: No erythema or rash.  Neurological:     Mental Status: She is alert and oriented to person, place, and time.      ED Treatments / Results  Labs (all labs ordered are listed, but only abnormal results are displayed) Labs Reviewed  CBC WITH DIFFERENTIAL/PLATELET - Abnormal; Notable for the following components:      Result Value   RBC 3.47 (*)    Hemoglobin 7.2 (*)    HCT 25.7 (*)    MCV 74.1 (*)    MCH 20.7 (*)    MCHC 28.0 (*)    All other components within normal limits  COMPREHENSIVE METABOLIC PANEL - Abnormal; Notable for the following components:   Sodium 133 (*)    CO2 19 (*)    Glucose, Bld 351 (*)    BUN 30 (*)    Creatinine, Ser 1.01 (*)    All other components within normal limits  IRON AND TIBC - Abnormal; Notable for the following components:   Iron 13 (*)    TIBC 522 (*)    Saturation Ratios 2 (*)    All other components  within normal limits  FERRITIN - Abnormal; Notable for the following components:   Ferritin 4 (*)  All other components within normal limits  RETICULOCYTES - Abnormal; Notable for the following components:   RBC. 3.36 (*)    Immature Retic Fract 22.2 (*)    All other components within normal limits  CBG MONITORING, ED - Abnormal; Notable for the following components:   Glucose-Capillary 148 (*)    All other components within normal limits  CBG MONITORING, ED - Abnormal; Notable for the following components:   Glucose-Capillary 131 (*)    All other components within normal limits  CBG MONITORING, ED - Abnormal; Notable for the following components:   Glucose-Capillary 109 (*)    All other components within normal limits  VITAMIN B12  FOLATE  CBC  BASIC METABOLIC PANEL  POC OCCULT BLOOD, ED  TYPE AND SCREEN  PREPARE RBC (CROSSMATCH)  ABO/RH  PREPARE RBC (CROSSMATCH)    EKG None  Radiology Dg Chest Portable 1 View  Result Date: 06/18/2018 CLINICAL DATA:  Shortness of breath. Atrial fibrillation. EXAM: PORTABLE CHEST 1 VIEW COMPARISON:  06/04/2018 FINDINGS: The heart size and pulmonary vascularity are normal. No infiltrates or effusions. Chronic blunting of the right costophrenic angle laterally. Surgical clips in the right lung. Bones are normal. IMPRESSION: No acute cardiopulmonary abnormality. Electronically Signed   By: Lorriane Shire M.D.   On: 06/18/2018 08:44    Procedures .Critical Care Performed by: Gareth Morgan, MD Authorized by: Gareth Morgan, MD   Critical care provider statement:    Critical care time (minutes):  30   Critical care was time spent personally by me on the following activities:  Evaluation of patient's response to treatment, examination of patient, ordering and performing treatments and interventions, ordering and review of laboratory studies, ordering and review of radiographic studies, pulse oximetry, re-evaluation of patient's condition,  obtaining history from patient or surrogate, review of old charts and discussions with consultants   (including critical care time)  Medications Ordered in ED Medications  0.9 %  sodium chloride infusion (Manually program via Guardrails IV Fluids) ( Intravenous Not Given 06/18/18 1506)  diltiazem (CARDIZEM) 100 mg in dextrose 5% 14m (1 mg/mL) infusion (10 mg/hr Intravenous Rate/Dose Change 06/18/18 2114)  insulin aspart (novoLOG) injection 0-9 Units (0 Units Subcutaneous Not Given 06/18/18 2116)  acetaminophen (TYLENOL) tablet 650 mg (has no administration in time range)  apixaban (ELIQUIS) tablet 5 mg (5 mg Oral Given 06/18/18 1940)  atorvastatin (LIPITOR) tablet 40 mg (has no administration in time range)  aspirin EC tablet 81 mg (has no administration in time range)  dicyclomine (BENTYL) tablet 20 mg (has no administration in time range)  famotidine (PEPCID) tablet 20 mg (has no administration in time range)  fenofibrate tablet 160 mg (160 mg Oral Given 06/18/18 1941)  ferrous sulfate tablet 325 mg (325 mg Oral Given 06/18/18 1940)  insulin glargine (LANTUS) injection 10 Units (has no administration in time range)  metoprolol tartrate (LOPRESSOR) injection 2.5 mg (2.5 mg Intravenous Given 06/18/18 0825)  0.9 %  sodium chloride infusion (Manually program via Guardrails IV Fluids) ( Intravenous Stopped 06/18/18 1845)     Initial Impression / Assessment and Plan / ED Course  I have reviewed the triage vital signs and the nursing notes.  Pertinent labs & imaging results that were available during my care of the patient were reviewed by me and considered in my medical decision making (see chart for details).    63year old female with a history of atrial fibrillation on Eliquis (consistently for last 2wks), chronic diastolic CHF, diabetes, hypertension, hyperlipidemia, hypertriglyceridemia, pancreatitis  who presents with concern for shortness of breath, lightheadedness with atrial fibrillation with RVR  noted on EMS ECG.  Given metoprolol with EMS, HR which was 160-180 now decreased to low 100s on arrival.  On arrival to ED patient with labile rate and rhythm. Given metoprolol 2.27m IV with decrease in rate but continues to have labile rhythm going in and out of sinus rhythm and atrial fibrillation and continues to have symptoms.  Given fluctuation between sinus and afib do not feel cardioversion appropriate.    Labs significant for a hgb of 7.3, prior around 9. Reports black stool last week, rectal exam without significant sample and is negative for occult blood. No signs of severe or acute GI bleeding. Ordered anemia panel. Concern for symptomatic anemia contributing to fatigue, lightheadedness and dyspnea.  Will transfuse 1u pRBC and continue to evaluate concern for symptomatic anemia and afib.  Will admit to hospitalist for further care.   Final Clinical Impressions(s) / ED Diagnoses   Final diagnoses:  Symptomatic anemia  Atrial fibrillation with rapid ventricular response ([facility]    ED Discharge Orders    None       SGareth Morgan MD 06/18/18 2216    SGareth Morgan MD 07/06/18 1339

## 2018-06-19 ENCOUNTER — Inpatient Hospital Stay (HOSPITAL_COMMUNITY): Payer: Self-pay

## 2018-06-19 ENCOUNTER — Other Ambulatory Visit: Payer: Self-pay

## 2018-06-19 ENCOUNTER — Encounter (HOSPITAL_COMMUNITY): Payer: Self-pay | Admitting: *Deleted

## 2018-06-19 DIAGNOSIS — I4891 Unspecified atrial fibrillation: Secondary | ICD-10-CM

## 2018-06-19 DIAGNOSIS — I361 Nonrheumatic tricuspid (valve) insufficiency: Secondary | ICD-10-CM

## 2018-06-19 DIAGNOSIS — I340 Nonrheumatic mitral (valve) insufficiency: Secondary | ICD-10-CM

## 2018-06-19 LAB — CBC
HCT: 29.5 % — ABNORMAL LOW (ref 36.0–46.0)
Hemoglobin: 8.7 g/dL — ABNORMAL LOW (ref 12.0–15.0)
MCH: 22.3 pg — ABNORMAL LOW (ref 26.0–34.0)
MCHC: 29.5 g/dL — ABNORMAL LOW (ref 30.0–36.0)
MCV: 75.4 fL — ABNORMAL LOW (ref 80.0–100.0)
Platelets: 264 10*3/uL (ref 150–400)
RBC: 3.91 MIL/uL (ref 3.87–5.11)
RDW: 16.7 % — ABNORMAL HIGH (ref 11.5–15.5)
WBC: 8.6 10*3/uL (ref 4.0–10.5)
nRBC: 0 % (ref 0.0–0.2)

## 2018-06-19 LAB — BASIC METABOLIC PANEL
Anion gap: 12 (ref 5–15)
BUN: 19 mg/dL (ref 8–23)
CO2: 21 mmol/L — ABNORMAL LOW (ref 22–32)
Calcium: 9.1 mg/dL (ref 8.9–10.3)
Chloride: 104 mmol/L (ref 98–111)
Creatinine, Ser: 0.74 mg/dL (ref 0.44–1.00)
GFR calc Af Amer: >60 mL/min (ref >60)
GFR calc non Af Amer: >60 mL/min (ref >60)
Glucose, Bld: 103 mg/dL — ABNORMAL HIGH (ref 70–99)
Potassium: 3.1 mmol/L — ABNORMAL LOW (ref 3.5–5.1)
SODIUM: 137 mmol/L (ref 135–145)

## 2018-06-19 LAB — BPAM RBC
Blood Product Expiration Date: 202001082359
ISSUE DATE / TIME: 202001031437
Unit Type and Rh: 5100

## 2018-06-19 LAB — TYPE AND SCREEN
ABO/RH(D): O POS
ANTIBODY SCREEN: NEGATIVE
Unit division: 0

## 2018-06-19 LAB — MRSA PCR SCREENING: MRSA by PCR: NEGATIVE

## 2018-06-19 LAB — GLUCOSE, CAPILLARY
GLUCOSE-CAPILLARY: 88 mg/dL (ref 70–99)
Glucose-Capillary: 94 mg/dL (ref 70–99)
Glucose-Capillary: 86 mg/dL (ref 70–99)
Glucose-Capillary: 90 mg/dL (ref 70–99)
Glucose-Capillary: 207 mg/dL — ABNORMAL HIGH (ref 70–99)

## 2018-06-19 LAB — ECHOCARDIOGRAM COMPLETE
HEIGHTINCHES: 63 in
WEIGHTICAEL: 2352.75 [oz_av]

## 2018-06-19 LAB — MAGNESIUM: Magnesium: 1.2 mg/dL — ABNORMAL LOW (ref 1.7–2.4)

## 2018-06-19 MED ORDER — FERROUS SULFATE 325 (65 FE) MG PO TABS
325.0000 mg | ORAL_TABLET | Freq: Once | ORAL | Status: AC
  Administered 2018-06-19: 325 mg via ORAL
  Filled 2018-06-19: qty 1

## 2018-06-19 MED ORDER — POTASSIUM CHLORIDE CRYS ER 20 MEQ PO TBCR
40.0000 meq | EXTENDED_RELEASE_TABLET | Freq: Two times a day (BID) | ORAL | Status: AC
  Administered 2018-06-19 (×2): 40 meq via ORAL
  Filled 2018-06-19 (×2): qty 2

## 2018-06-19 MED ORDER — SODIUM CHLORIDE 0.9 % IV SOLN
INTRAVENOUS | Status: DC
  Administered 2018-06-19: 16:00:00 via INTRAVENOUS

## 2018-06-19 MED ORDER — DILTIAZEM HCL ER COATED BEADS 240 MG PO CP24
240.0000 mg | ORAL_CAPSULE | Freq: Every day | ORAL | Status: DC
  Administered 2018-06-19 – 2018-06-24 (×6): 240 mg via ORAL
  Filled 2018-06-19 (×6): qty 1

## 2018-06-19 NOTE — Progress Notes (Signed)
Patient ID: Emily Farmer, female   DOB: January 15, 1956, 63 y.o.   MRN: [id number]   Progress Note  Patient Name: Emily Farmer Date of Encounter: 06/19/2018  Primary Cardiologist: No primary care provider on file.   Subjective   She has been in and out of atrial fibrillation overnight, currently back in atrial fibrillation with rate in 100s.  She feels palpitations.  SBP 120s.    She got 2 units PRBCs with hgb up to 8.7.  FOBT negative.  Profound Fe deficiency.    Apixaban on hold.   Inpatient Medications    Scheduled Meds: . sodium chloride   Intravenous Once  . atorvastatin  40 mg Oral Daily  . dicyclomine  20 mg Oral BID  . famotidine  20 mg Oral BID  . fenofibrate  160 mg Oral Daily  . ferrous sulfate  325 mg Oral BID WC  . insulin aspart  0-9 Units Subcutaneous Q4H  . insulin glargine  10 Units Subcutaneous QHS  . potassium chloride  40 mEq Oral BID   Continuous Infusions: . diltiazem (CARDIZEM) infusion Stopped (06/19/18 0243)   PRN Meds: acetaminophen   Vital Signs    Vitals:   06/19/18 0600 06/19/18 0630 06/19/18 0700 06/19/18 0707  BP: 100/68 111/79 122/67 (!) 129/92  Pulse: 81 91 95 85  Resp: 17 16 17 17   Temp:    98 F (36.7 C)  TempSrc:    Oral  SpO2: 93% 96% 95% 97%  Weight:      Height:        Intake/Output Summary (Last 24 hours) at 06/19/2018 1016 Last data filed at 06/19/2018 0243 Gross per 24 hour  Intake 389.17 ml  Output -  Net 389.17 ml   Filed Weights   06/19/18 0100  Weight: 66.7 kg    Telemetry    Atrial fibrillation in 100s - Personally Reviewed  Physical Exam   GEN: No acute distress.   Neck: No JVD Cardiac: Mildly tachy, irregular rhythm, no murmurs, rubs, or gallops. No peripheral edema.  Respiratory: Clear to auscultation bilaterally. GI: Soft, nontender, non-distended  MS: No edema; No deformity. Neuro:  Nonfocal  Psych: Normal affect   Labs    Chemistry Recent Labs  Lab 06/18/18 0806 06/19/18 0235  NA 133* 137    K 4.0 3.1*  CL 101 104  CO2 19* 21*  GLUCOSE 351* 103*  BUN 30* 19  CREATININE 1.01* 0.74  CALCIUM 9.0 9.1  PROT 6.7  --   ALBUMIN 3.5  --   AST 15  --   ALT 15  --   ALKPHOS 66  --   BILITOT 0.3  --   GFRNONAA >60 >60  GFRAA >60 >60  ANIONGAP 13 12     Hematology Recent Labs  Lab 06/18/18 0806 06/18/18 1235 06/19/18 0235  WBC 9.1  --  8.6  RBC 3.47* 3.36* 3.91  HGB 7.2*  --  8.7*  HCT 25.7*  --  29.5*  MCV 74.1*  --  75.4*  MCH 20.7*  --  22.3*  MCHC 28.0*  --  29.5*  RDW 14.6  --  16.7*  PLT 300  --  264    Cardiac EnzymesNo results for input(s): TROPONINI in the last 168 hours. No results for input(s): TROPIPOC in the last 168 hours.   BNPNo results for input(s): BNP, PROBNP in the last 168 hours.   DDimer No results for input(s): DDIMER in the last 168 hours.   Radiology  Dg Chest Portable 1 View  Result Date: 06/18/2018 CLINICAL DATA:  Shortness of breath. Atrial fibrillation. EXAM: PORTABLE CHEST 1 VIEW COMPARISON:  06/04/2018 FINDINGS: The heart size and pulmonary vascularity are normal. No infiltrates or effusions. Chronic blunting of the right costophrenic angle laterally. Surgical clips in the right lung. Bones are normal. IMPRESSION: No acute cardiopulmonary abnormality. Electronically Signed   By: Francene Boyers M.D.   On: 06/18/2018 08:44   Assessment/Plan:  1. Atrial fibrillation: History of paroxysmal atrial fibrillation.  In atrial fibrillation with RVR this admission in setting of profound anemia.  She went back into NSR overnight with IV diltiazem, now back in afib with mild RVR again.  Hemodynamically stable. Feels mild palpitations.  - Can give her po diltiazem for now to rate control, will start diltiazem CD 240 mg daily.  - Eliquis on hold for ?GI bleeding.  - Should stop aspirin.  - Getting echo today.  2. Anemia: Profound Fe deficiency anemia, now s/p 2 units PRBCs.  Though FOBT negative, would still be concerned for GI source with Fe  deficiency anemia.  She is currently off apixaban, so a good time for endoscopies.  - Would involve GI service.   For questions or updates, please contact [facility] Please consult [email or web address] for contact info under        Signed, Marca Ancona, MD  06/19/2018, 10:16 AM

## 2018-06-19 NOTE — Consult Note (Signed)
Reason for Consult: Anemia Referring Physician: Hospital team  Emily BoehringerRosemary Farmer is an 63 y.o. female.  HPI: Patient seen and examined and case discussed with the hospital team and her hospital computer chart was reviewed as well as our office computer chart and her last colonoscopy was done by my partner in our office in 2009 and she has no GI complaints and only occasionally will see dark stools but she blames that on either her vitamins or vegetables and no GI problems run in the family and she was admitted with symptomatic anemia and has been on a blood thinner as well as aspirin but no additional nonsteroidals and she does work and lives with her sister  Past Medical History:  Diagnosis Date  . Arthritis   . Atrial fibrillation (HCC)    a. 09/2016 in setting of pancreatitis;  b. 09/2016 Echo: EF 65-60%, no rwma, Gr2 DD, mild MR, triv TR, PASP 30mmHg;  CHA2DS2VASc = 4-->Eliquis 5mg  BID.  Marland Kitchen. Chronic diastolic CHF (congestive heart failure) (HCC) 01/28/2018  . Diabetes mellitus   . Gout   . Hyperlipidemia   . Hypertension   . Hypertriglyceridemia   . Pancreatitis    a. 09/2016 - Triglycerides 1,392 on admission.    Past Surgical History:  Procedure Laterality Date  . CHOLECYSTECTOMY N/A 01/04/2014   Procedure: LAPAROSCOPIC CHOLECYSTECTOMY WITH INTRAOPERATIVE CHOLANGIOGRAM;  Surgeon: Axel FillerArmando Ramirez, MD;  Location: [facility] OR;  Service: General;  Laterality: N/A;  . LUNG SURGERY      Family History  Problem Relation Age of Onset  . Heart attack Mother   . Heart attack Father   . Diabetes Sister   . High blood pressure Neg Hx   . High Cholesterol Neg Hx     Social History:  reports that she has never smoked. She has never used smokeless tobacco. She reports that she does not drink alcohol or use drugs.  Allergies: No Known Allergies  Medications: I have reviewed the patient's current medications.  Results for orders placed or performed during the hospital encounter of 06/18/18 (from the  past 48 hour(s))  CBC with Differential     Status: Abnormal   Collection Time: 06/18/18  8:06 AM  Result Value Ref Range   WBC 9.1 4.0 - 10.5 K/uL   RBC 3.47 (L) 3.87 - 5.11 MIL/uL   Hemoglobin 7.2 (L) 12.0 - 15.0 g/dL    Comment: Reticulocyte Hemoglobin testing may be clinically indicated, consider ordering this additional test [id number]    HCT 25.7 (L) 36.0 - 46.0 %   MCV 74.1 (L) 80.0 - 100.0 fL   MCH 20.7 (L) 26.0 - 34.0 pg   MCHC 28.0 (L) 30.0 - 36.0 g/dL   RDW 95.614.6 21.311.5 - 08.615.5 %   Platelets 300 150 - 400 K/uL   nRBC 0.0 0.0 - 0.2 %   Neutrophils Relative % 69 %   Neutro Abs 6.2 1.7 - 7.7 K/uL   Lymphocytes Relative 22 %   Lymphs Abs 2.0 0.7 - 4.0 K/uL   Monocytes Relative 6 %   Monocytes Absolute 0.6 0.1 - 1.0 K/uL   Eosinophils Relative 2 %   Eosinophils Absolute 0.2 0.0 - 0.5 K/uL   Basophils Relative 1 %   Basophils Absolute 0.1 0.0 - 0.1 K/uL   Immature Granulocytes 0 %   Abs Immature Granulocytes 0.03 0.00 - 0.07 K/uL    Comment: Performed at [facility], [address] [place], [place] [address]  Comprehensive metabolic panel  Status: Abnormal   Collection Time: 06/18/18  8:06 AM  Result Value Ref Range   Sodium 133 (L) 135 - 145 mmol/L   Potassium 4.0 3.5 - 5.1 mmol/L   Chloride 101 98 - 111 mmol/L   CO2 19 (L) 22 - 32 mmol/L   Glucose, Bld 351 (H) 70 - 99 mg/dL   BUN 30 (H) 8 - 23 mg/dL   Creatinine, Ser 1.61 (H) 0.44 - 1.00 mg/dL   Calcium 9.0 8.9 - 09.6 mg/dL   Total Protein 6.7 6.5 - 8.1 g/dL   Albumin 3.5 3.5 - 5.0 g/dL   AST 15 15 - 41 U/L   ALT 15 0 - 44 U/L   Alkaline Phosphatase 66 38 - 126 U/L   Total Bilirubin 0.3 0.3 - 1.2 mg/dL   GFR calc non Af Amer >60 >60 mL/min   GFR calc Af Amer >60 >60 mL/min   Anion gap 13 5 - 15    Comment: Performed at [facility], [address] [place], [place] [address]  POC occult blood, ED     Status: None   Collection Time: 06/18/18 11:20 AM  Result Value Ref Range   Fecal Occult Bld  NEGATIVE NEGATIVE  Type and screen [facility]     Status: None (Preliminary result)   Collection Time: 06/18/18 12:35 PM  Result Value Ref Range   ABO/RH(D) O POS    Antibody Screen NEG    Sample Expiration 06/21/2018    Unit Number [id number]    Blood Component Type RED CELLS,LR    Unit division 00    Status of Unit ISSUED    Transfusion Status OK TO TRANSFUSE    Crossmatch Result      Compatible Performed at [facility], [address] [place], [place] [address]   Vitamin B12     Status: None   Collection Time: 06/18/18 12:35 PM  Result Value Ref Range   Vitamin B-12 610 180 - 914 pg/mL    Comment: (NOTE) This assay is not validated for testing neonatal or myeloproliferative syndrome specimens for Vitamin B12 levels. Performed at [facility], [address], [place], [place] [address]   Folate     Status: None   Collection Time: 06/18/18 12:35 PM  Result Value Ref Range   Folate 42.4 >5.9 ng/mL    Comment: RESULTS CONFIRMED BY MANUAL DILUTION Performed at [facility], [address], [place], [place] [address]   Iron and TIBC     Status: Abnormal   Collection Time: 06/18/18 12:35 PM  Result Value Ref Range   Iron 13 (L) 28 - 170 ug/dL   TIBC 528 (H) 413 - 244 ug/dL   Saturation Ratios 2 (L) 10.4 - 31.8 %   UIBC 509 ug/dL    Comment: Performed at [facility], [address], [place], [place] [address]  Ferritin     Status: Abnormal   Collection Time: 06/18/18 12:35 PM  Result Value Ref Range   Ferritin 4 (L) 11 - 307 ng/mL    Comment: Performed at [facility], [address] [place], [place] [address]  Reticulocytes     Status: Abnormal   Collection Time: 06/18/18 12:35 PM  Result Value Ref Range   Retic Ct Pct 1.4 0.4 - 3.1 %   RBC. 3.36 (L) 3.87 - 5.11 MIL/uL   Retic Count, Absolute 48.0 19.0 - 186.0 K/uL  Immature Retic Fract 22.2 (H) 2.3 - 15.9 %    Comment: Performed at [facility], [address] [place], [place] [address]  Prepare RBC     Status: None   Collection Time: 06/18/18 12:35 PM  Result Value Ref Range   Order Confirmation      ORDER PROCESSED BY BLOOD BANK Performed at [facility], [address] [place], [place] [address]   ABO/Rh     Status: None   Collection Time: 06/18/18 12:35 PM  Result Value Ref Range   ABO/RH(D)      O POS Performed at [facility], [address] [place], [place] [address]   Prepare RBC     Status: None   Collection Time: 06/18/18  2:34 PM  Result Value Ref Range   Order Confirmation      ORDER PROCESSED BY BLOOD BANK Performed at [facility], [address] [place], [place] [address]   CBG monitoring, ED     Status: Abnormal   Collection Time: 06/18/18  4:01 PM  Result Value Ref Range   Glucose-Capillary 148 (H) 70 - 99 mg/dL  CBG monitoring, ED     Status: Abnormal   Collection Time: 06/18/18  6:13 PM  Result Value Ref Range   Glucose-Capillary 131 (H) 70 - 99 mg/dL  CBG monitoring, ED     Status: Abnormal   Collection Time: 06/18/18  9:09 PM  Result Value Ref Range   Glucose-Capillary 109 (H) 70 - 99 mg/dL  MRSA PCR Screening     Status: None   Collection Time: 06/19/18  1:05 AM  Result Value Ref Range   MRSA by PCR NEGATIVE NEGATIVE    Comment:        The GeneXpert MRSA Assay (FDA approved for NASAL specimens only), is one component of a comprehensive MRSA colonization surveillance program. It is not intended to diagnose MRSA infection nor to guide or monitor treatment for MRSA infections. Performed at [facility], [address] [place], [place] [address]   CBC     Status: Abnormal   Collection Time: 06/19/18  2:35 AM  Result Value Ref Range   WBC 8.6 4.0 - 10.5 K/uL   RBC 3.91 3.87 - 5.11 MIL/uL   Hemoglobin 8.7 (L) 12.0 - 15.0 g/dL    Comment: Reticulocyte Hemoglobin testing may be clinically indicated, consider ordering this additional test [id number]    HCT 29.5 (L)  36.0 - 46.0 %   MCV 75.4 (L) 80.0 - 100.0 fL   MCH 22.3 (L) 26.0 - 34.0 pg   MCHC 29.5 (L) 30.0 - 36.0 g/dL   RDW 40.116.7 (H) 02.711.5 - 25.315.5 %   Platelets 264 150 - 400 K/uL   nRBC 0.0 0.0 - 0.2 %    Comment: Performed at [facility], [address] [place], [place] [address]  Basic metabolic panel     Status: Abnormal   Collection Time: 06/19/18  2:35 AM  Result Value Ref Range   Sodium 137 135 - 145 mmol/L   Potassium 3.1 (L) 3.5 - 5.1 mmol/L   Chloride 104 98 - 111 mmol/L   CO2 21 (L) 22 - 32 mmol/L   Glucose, Bld 103 (H) 70 - 99 mg/dL   BUN 19 8 - 23 mg/dL   Creatinine, Ser 3.470.74 0.44 - 1.00 mg/dL   Calcium 9.1 8.9 - 42.510.3 mg/dL   GFR calc non Af Amer >  60 >60 mL/min   GFR calc Af Amer >60 >60 mL/min   Anion gap 12 5 - 15    Comment: Performed at [facility], [address], [place], [place] [address]  Glucose, capillary     Status: None   Collection Time: 06/19/18  3:30 AM  Result Value Ref Range   Glucose-Capillary 94 70 - 99 mg/dL   Comment 1 Notify RN   Glucose, capillary     Status: None   Collection Time: 06/19/18  7:44 AM  Result Value Ref Range   Glucose-Capillary 86 70 - 99 mg/dL  Glucose, capillary     Status: None   Collection Time: 06/19/18 11:41 AM  Result Value Ref Range   Glucose-Capillary 88 70 - 99 mg/dL    Dg Chest Portable 1 View  Result Date: 06/18/2018 CLINICAL DATA:  Shortness of breath. Atrial fibrillation. EXAM: PORTABLE CHEST 1 VIEW COMPARISON:  06/04/2018 FINDINGS: The heart size and pulmonary vascularity are normal. No infiltrates or effusions. Chronic blunting of the right costophrenic angle laterally. Surgical clips in the right lung. Bones are normal. IMPRESSION: No acute cardiopulmonary abnormality. Electronically Signed   By: Francene Boyers M.D.   On: 06/18/2018 08:44    ROS negative except above Blood pressure 109/82, pulse 71, temperature 98.2 F (36.8 C), temperature source Oral, resp. rate 19, height 5\' 3"  (1.6 m), weight 66.7  kg, SpO2 98 %. Physical Exam vital signs stable afebrile no acute distress lungs are clear decreased heart sounds abdomen is soft nontender BUN and creatinine okay hemoglobin increased with transfusion she was iron deficient  Assessment/Plan: Iron deficiency and patient on aspirin and Eliquis Plan: The risks benefits methods of endoscopy was discussed and we compared it to the colonoscopy she had in the past and will go ahead and proceed tomorrow morning and pending those findings will either need inpatient or outpatient colonoscopy and will keep her on clear liquids and hold iron for now in case that will be done as an inpatient  [facility] E 06/19/2018, 1:36 PM

## 2018-06-19 NOTE — Progress Notes (Signed)
PROGRESS NOTE  Emily Farmer FVC:[id number] DOB: 08/13/1955 DOA: 06/18/2018 PCP: Cain Saupe, MD  HPI/Recap of past 24 hours: Emily Farmer is a 63 y.o. female with medical history significant for paroxysmal A. fib on Eliquis, diastolic CHF, type 2 diabetes, hyperlipidemia who presented to [facility] ED via EMS with complaints of shortness of breath and lightheadedness of a few hours duration.  She was working at [employer] when suddenly started feeling significantly short of breath.  Associated with lightheadedness.  No falls.  States she has had similar symptoms in the past when she was initially diagnosed with A. fib a year ago.  Admits to compliance with her medications.  Denies any chest pain.  She was in her usual state of health prior to this.  Denies lower extremity edema or orthopnea.  No improving or worsening factors.  Reports a fall at home about 2 weeks ago, hit her head.  Self reports had head CT which was unremarkable.  Admits to occasional dark stool but states she she takes iron supplement.  First colonoscopy was a year ago, seen by [facility].  In the ED in A. fib RVR.  Was given a dose of Lopressor with improvement but recurrence.  Low hemoglobin 7.2 with baseline hemoglobin of 10 in September 2019.  Negative FOBT.  Lab studies remarkable for profound iron deficiency.  TRH is asked to admit.  06/19/18: Seen and examined at her bedside.  Reports occasional left-sided chest pain, dull, lasting less than 5 minutes.  GI consulted today for possible endoscopy.  N.p.o. until evaluated by GI.  Assessment/Plan: Active Problems:   Symptomatic anemia  Symptomatic anemia/iron deficiency anemia Presented with hemoglobin of 7.2 with baseline hemoglobin of 10.2 in 02/22/2018 FOBT negative Profound iron deficiency from iron studies Continue ferrous sulfate 325 twice daily Transfused 2 unit PRBCs>> hemoglobin 8.7 from 7.2 status post 2 unit PRBC transfusion Today to monitor H&H Repeat CBC in the  morning [facility] has been consulted for possible endoscopy  Improving symptomatic A. fib with RVR post PRBCs transfusion Presented with dyspnea with minimal movement Initially on Cardizem drip and titrate as needed Start diltiazem CD 240 mg daily as recommended by cardiology Rate is better controlled Continue to monitor vital signs on telemetry unit Cardiology following Eliquis on hold due to possible GI source.  Possible endoscopy  Hypokalemia Potassium 3.1 Repleted with 40 mEq KCl p.o. x2 doses Obtain magnesium level Repeat BMP in the morning  Type 2 diabetes Last A1c done in November 2019 revealed A1c of 9.8 Continue insulin sliding scale Continue long-acting insulin at half dose N.p.o. until GI evaluates  Chronic diastolic CHF Last 2D echo done in 10/04/2016 Revealed preserved LVEF and grade 2 diastolic dysfunction Resume cardiac medications Strict I's and O's and daily weight 2D echo pending  Hyperlipidemia Resume home medications LDL 105 on 03/22/2018  Improving anion gap metabolic acidosis Anion gap now 12 from 13, chemistry bicarb now 21 from 19  Glucose level also improving 103 from 351 Repeat BMP in the morning  Resolved pseudohyponatremia suspect secondary to hyperglycemia    Risks: Patient is high risk for decompensation due to symptomatic anemia with suspected GI source, profound iron deficiency anemia requiring RBC transfusion, symptomatic A. fib with RVR, multiple comorbidities and advanced age.  Patient will require at least 2 midnights for further evaluation and treatment of present condition.  DVT prophylaxis: Eliquis  Code Status: Full code  Family Communication: None at bedside  Disposition Plan:  Home when hemodynamically  stable and when cardiology and GI signed off.  Consults called: Cardiology and GI      Objective: Vitals:   06/19/18 0700 06/19/18 0707 06/19/18 1140 06/19/18 1141  BP: 122/67 (!) 129/92 109/82 109/82    Pulse: 95 85 74 71  Resp: 17 17 13 19   Temp:  98 F (36.7 C)  98.2 F (36.8 C)  TempSrc:  Oral  Oral  SpO2: 95% 97% 98% 98%  Weight:      Height:        Intake/Output Summary (Last 24 hours) at 06/19/2018 1232 Last data filed at 06/19/2018 1141 Gross per 24 hour  Intake 389.17 ml  Output -  Net 389.17 ml   Filed Weights   06/19/18 0100  Weight: 66.7 kg    Exam:  . General: 63 y.o. year-old female well developed well nourished in no acute distress.  Alert and oriented x3. . Cardiovascular: Regular rate and rhythm with no rubs or gallops.  No thyromegaly or JVD noted.   Marland Kitchen. Respiratory: Clear to auscultation with no wheezes or rales. Good inspiratory effort. . Abdomen: Soft nontender nondistended with normal bowel sounds x4 quadrants. . Musculoskeletal: No lower extremity edema. 2/4 pulses in all 4 extremities. . Skin: No ulcerative lesions noted or rashes, . Psychiatry: Mood is appropriate for condition and setting   Data Reviewed: CBC: Recent Labs  Lab 06/18/18 0806 06/19/18 0235  WBC 9.1 8.6  NEUTROABS 6.2  --   HGB 7.2* 8.7*  HCT 25.7* 29.5*  MCV 74.1* 75.4*  PLT 300 264   Basic Metabolic Panel: Recent Labs  Lab 06/18/18 0806 06/19/18 0235  NA 133* 137  K 4.0 3.1*  CL 101 104  CO2 19* 21*  GLUCOSE 351* 103*  BUN 30* 19  CREATININE 1.01* 0.74  CALCIUM 9.0 9.1   GFR: Estimated Creatinine Clearance: 67.7 mL/min (by C-G formula based on SCr of 0.74 mg/dL). Liver Function Tests: Recent Labs  Lab 06/18/18 0806  AST 15  ALT 15  ALKPHOS 66  BILITOT 0.3  PROT 6.7  ALBUMIN 3.5   No results for input(s): LIPASE, AMYLASE in the last 168 hours. No results for input(s): AMMONIA in the last 168 hours. Coagulation Profile: No results for input(s): INR, PROTIME in the last 168 hours. Cardiac Enzymes: No results for input(s): CKTOTAL, CKMB, CKMBINDEX, TROPONINI in the last 168 hours. BNP (last 3 results) No results for input(s): PROBNP in the last 8760  hours. HbA1C: No results for input(s): HGBA1C in the last 72 hours. CBG: Recent Labs  Lab 06/18/18 1813 06/18/18 2109 06/19/18 0330 06/19/18 0744 06/19/18 1141  GLUCAP 131* 109* 94 86 88   Lipid Profile: No results for input(s): CHOL, HDL, LDLCALC, TRIG, CHOLHDL, LDLDIRECT in the last 72 hours. Thyroid Function Tests: No results for input(s): TSH, T4TOTAL, FREET4, T3FREE, THYROIDAB in the last 72 hours. Anemia Panel: Recent Labs    06/18/18 1235  VITAMINB12 610  FOLATE 42.4  FERRITIN 4*  TIBC 522*  IRON 13*  RETICCTPCT 1.4   Urine analysis:    Component Value Date/Time   COLORURINE YELLOW 01/28/2018 0727   APPEARANCEUR CLEAR 01/28/2018 0727   LABSPEC 1.028 01/28/2018 0727   PHURINE 5.0 01/28/2018 0727   GLUCOSEU >=500 (A) 01/28/2018 0727   HGBUR SMALL (A) 01/28/2018 0727   BILIRUBINUR negative 04/05/2018 1047   KETONESUR negative 04/05/2018 1047   KETONESUR NEGATIVE 01/28/2018 0727   PROTEINUR NEGATIVE 01/28/2018 0727   UROBILINOGEN 0.2 04/05/2018 1047   UROBILINOGEN 0.2  01/03/2014 2001   NITRITE Negative 04/05/2018 1047   NITRITE NEGATIVE 01/28/2018 0727   LEUKOCYTESUR Small (1+) (A) 04/05/2018 1047   Sepsis Labs: @LABRCNTIP (procalcitonin:4,lacticidven:4)  ) Recent Results (from the past 240 hour(s))  MRSA PCR Screening     Status: None   Collection Time: 06/19/18  1:05 AM  Result Value Ref Range Status   MRSA by PCR NEGATIVE NEGATIVE Final    Comment:        The GeneXpert MRSA Assay (FDA approved for NASAL specimens only), is one component of a comprehensive MRSA colonization surveillance program. It is not intended to diagnose MRSA infection nor to guide or monitor treatment for MRSA infections. Performed at [facility], [address] [place], [place] [address]       Studies: No results found.  Scheduled Meds: . sodium chloride   Intravenous Once  . atorvastatin  40 mg Oral Daily  . dicyclomine  20 mg Oral BID  . diltiazem  240  mg Oral Daily  . famotidine  20 mg Oral BID  . fenofibrate  160 mg Oral Daily  . ferrous sulfate  325 mg Oral BID WC  . insulin aspart  0-9 Units Subcutaneous Q4H  . insulin glargine  10 Units Subcutaneous QHS  . potassium chloride  40 mEq Oral BID    Continuous Infusions:   LOS: 1 day     Darlin Drop, MD [facility] Pager [phone]  If 7PM-7AM, please contact night-coverage [email or web address] Password [facility] 06/19/2018, 12:32 PM

## 2018-06-19 NOTE — Plan of Care (Signed)
  Problem: Education: Goal: Understanding of medication regimen will improve Outcome: Progressing   Problem: Cardiac: Goal: Ability to achieve and maintain adequate cardiopulmonary perfusion will improve Outcome: Progressing   Problem: Health Behavior/Discharge Planning: Goal: Ability to safely manage health-related needs after discharge will improve Outcome: Progressing   

## 2018-06-19 NOTE — Plan of Care (Signed)
  Problem: Education: Goal: Knowledge of disease or condition will improve Outcome: Progressing Goal: Understanding of medication regimen will improve Outcome: Progressing Goal: Individualized Educational Video(s) Outcome: Progressing   Problem: Cardiac: Goal: Ability to achieve and maintain adequate cardiopulmonary perfusion will improve Outcome: Progressing   Problem: Health Behavior/Discharge Planning: Goal: Ability to safely manage health-related needs after discharge will improve Outcome: Progressing   Problem: Health Behavior/Discharge Planning: Goal: Ability to manage health-related needs will improve Outcome: Progressing   Problem: Clinical Measurements: Goal: Ability to maintain clinical measurements within normal limits will improve Outcome: Progressing Goal: Will remain free from infection Outcome: Progressing Goal: Diagnostic test results will improve Outcome: Progressing Goal: Cardiovascular complication will be avoided Outcome: Progressing   Problem: Nutrition: Goal: Adequate nutrition will be maintained Outcome: Progressing   Problem: Coping: Goal: Level of anxiety will decrease Outcome: Progressing   Problem: Safety: Goal: Ability to remain free from injury will improve Outcome: Progressing   Problem: Skin Integrity: Goal: Risk for impaired skin integrity will decrease Outcome: Progressing   

## 2018-06-19 NOTE — Progress Notes (Signed)
  Echocardiogram 2D Echocardiogram has been performed.  Roosvelt Maser F 06/19/2018, 10:51 AM

## 2018-06-19 NOTE — ED Notes (Signed)
Report given to rn on 2c 

## 2018-06-20 ENCOUNTER — Inpatient Hospital Stay (HOSPITAL_COMMUNITY): Payer: Self-pay | Admitting: Certified Registered Nurse Anesthetist

## 2018-06-20 ENCOUNTER — Encounter: Payer: Self-pay | Admitting: Family Medicine

## 2018-06-20 ENCOUNTER — Encounter (HOSPITAL_COMMUNITY): Payer: Self-pay

## 2018-06-20 ENCOUNTER — Encounter (HOSPITAL_COMMUNITY)
Admission: EM | Disposition: A | Discharge status: Home / Self Care | Payer: Self-pay | Source: Home / Self Care | Attending: Internal Medicine

## 2018-06-20 HISTORY — PX: BIOPSY: SHX5522

## 2018-06-20 HISTORY — PX: ESOPHAGOGASTRODUODENOSCOPY (EGD) WITH PROPOFOL: SHX5813

## 2018-06-20 LAB — GLUCOSE, CAPILLARY
Glucose-Capillary: 100 mg/dL — ABNORMAL HIGH (ref 70–99)
Glucose-Capillary: 125 mg/dL — ABNORMAL HIGH (ref 70–99)
Glucose-Capillary: 126 mg/dL — ABNORMAL HIGH (ref 70–99)
Glucose-Capillary: 137 mg/dL — ABNORMAL HIGH (ref 70–99)
Glucose-Capillary: 138 mg/dL — ABNORMAL HIGH (ref 70–99)
Glucose-Capillary: 196 mg/dL — ABNORMAL HIGH (ref 70–99)
Glucose-Capillary: 200 mg/dL — ABNORMAL HIGH (ref 70–99)

## 2018-06-20 LAB — BASIC METABOLIC PANEL
Anion gap: 8 (ref 5–15)
BUN: 12 mg/dL (ref 8–23)
CO2: 24 mmol/L (ref 22–32)
Calcium: 9 mg/dL (ref 8.9–10.3)
Chloride: 106 mmol/L (ref 98–111)
Creatinine, Ser: 0.71 mg/dL (ref 0.44–1.00)
GFR calc Af Amer: >60 mL/min (ref >60)
GFR calc non Af Amer: >60 mL/min (ref >60)
Glucose, Bld: 104 mg/dL — ABNORMAL HIGH (ref 70–99)
Potassium: 4.3 mmol/L (ref 3.5–5.1)
Sodium: 138 mmol/L (ref 135–145)

## 2018-06-20 LAB — CBC
HEMATOCRIT: 29.7 % — AB (ref 36.0–46.0)
Hemoglobin: 9.1 g/dL — ABNORMAL LOW (ref 12.0–15.0)
MCH: 23.2 pg — ABNORMAL LOW (ref 26.0–34.0)
MCHC: 30.6 g/dL (ref 30.0–36.0)
MCV: 75.8 fL — ABNORMAL LOW (ref 80.0–100.0)
Platelets: 289 10*3/uL (ref 150–400)
RBC: 3.92 MIL/uL (ref 3.87–5.11)
RDW: 16.7 % — ABNORMAL HIGH (ref 11.5–15.5)
WBC: 6.3 10*3/uL (ref 4.0–10.5)
nRBC: 0 % (ref 0.0–0.2)

## 2018-06-20 SURGERY — ESOPHAGOGASTRODUODENOSCOPY (EGD) WITH PROPOFOL
Anesthesia: Monitor Anesthesia Care

## 2018-06-20 MED ORDER — SODIUM CHLORIDE 0.9 % IV SOLN: INTRAVENOUS | Status: DC

## 2018-06-20 MED ORDER — PHENYLEPHRINE 40 MCG/ML (10ML) SYRINGE FOR IV PUSH (FOR BLOOD PRESSURE SUPPORT)
PREFILLED_SYRINGE | INTRAVENOUS | Status: DC | PRN
  Administered 2018-06-20: 80 ug via INTRAVENOUS

## 2018-06-20 MED ORDER — PROPOFOL 500 MG/50ML IV EMUL
INTRAVENOUS | Status: DC | PRN
  Administered 2018-06-20: 100 ug/kg/min via INTRAVENOUS

## 2018-06-20 MED ORDER — PANTOPRAZOLE SODIUM 40 MG PO TBEC
40.0000 mg | DELAYED_RELEASE_TABLET | Freq: Every day | ORAL | Status: DC
  Administered 2018-06-20 – 2018-06-24 (×5): 40 mg via ORAL
  Filled 2018-06-20 (×5): qty 1

## 2018-06-20 MED ORDER — PEG 3350-KCL-NA BICARB-NACL 420 G PO SOLR
4000.0000 mL | Freq: Once | ORAL | Status: AC
  Administered 2018-06-20: 4000 mL via ORAL
  Filled 2018-06-20 (×2): qty 4000

## 2018-06-20 MED ORDER — PROPOFOL 10 MG/ML IV BOLUS
INTRAVENOUS | Status: DC | PRN
  Administered 2018-06-20 (×2): 20 mg via INTRAVENOUS

## 2018-06-20 SURGICAL SUPPLY — 15 items

## 2018-06-20 NOTE — Op Note (Signed)
[facility] Patient Name: Emily Farmer Procedure Date : 06/20/2018 MRN: [id number] Attending MD: Vida Rigger , MD Date of Birth: [birth year] CSN: [id number] Age: 63 Admit Type: Inpatient Procedure:                Upper GI endoscopy Indications:              Iron deficiency anemia Providers:                Vida Rigger, MD, Norman Clay, RN, Leslie Dales, RN,                            Verita Schneiders, Technician, Dairl Ponder, CRNA Referring MD:              Medicines:                Propofol total dose 100 mg IV Complications:            No immediate complications. Estimated Blood Loss:     Estimated blood loss: none. Procedure:                Pre-Anesthesia Assessment:                           - Prior to the procedure, a History and Physical                            was performed, and patient medications and                            allergies were reviewed. The patient's tolerance of                            previous anesthesia was also reviewed. The risks                            and benefits of the procedure and the sedation                            options and risks were discussed with the patient.                            All questions were answered, and informed consent                            was obtained. Prior Anticoagulants: The patient has                            taken Eliquis (apixaban), last dose was 2 days                            prior to procedure. ASA Grade Assessment: II - A                            patient with mild systemic disease. After reviewing  the risks and benefits, the patient was deemed in                            satisfactory condition to undergo the procedure.                           After obtaining informed consent, the endoscope was                            passed under direct vision. Throughout the                            procedure, the patient's blood pressure, pulse, and               oxygen saturations were monitored continuously. The                            GIF-H190 ([id number] Olympus gastroscope was                            introduced through the mouth, and advanced to the                            second part of duodenum. The upper GI endoscopy was                            accomplished without difficulty. The patient                            tolerated the procedure well. Scope In: Scope Out: Findings:      A ?tiny hiatal hernia was present.      Patchy mild inflammation characterized by congestion (edema) and       erythema was found on the greater curvature of the stomach, on the       lesser curvature of the stomach and in the gastric antrum. Biopsies were       taken of antrum and fundus with a cold forceps for histology to rule out       H. pylori. Flecks of dark material was seen but it probably was just her       iron she was on      Few non-bleeding small cratered and superficial duodenal ulcers with no       stigmata of bleeding were found in the duodenal bulb.      The second portion of the duodenum was normal.      The exam was otherwise without abnormality. Impression:               - ?tiny hiatal hernia.                           - Chronic gastritis. Biopsied. To rule out H. pylori                           -Few small non-bleeding duodenal ulcers with no  stigmata of bleeding.                           - Normal second portion of the duodenum.                           - The examination was otherwise normal. Recommendation:           - Clear liquid diet today.                           - Perform a colonoscopy tomorrow.                           - Use Protonix (pantoprazole) 40 mg PO daily                            indefinitely particularly if she stays on aspirin.                           - Await pathology results.                           - Return to GI clinic in 4 weeks.                           -  Telephone GI clinic for pathology results in 1                            week.                           - Telephone GI clinic if symptomatic PRN. Have                            cardiology reevaluate her blood thinner and aspirin                            needs going forward Procedure Code(s):        --- Professional ---                           [phone], Esophagogastroduodenoscopy, flexible,                            transoral; with biopsy, single or multiple Diagnosis Code(s):        --- Professional ---                           K44.9, Diaphragmatic hernia without obstruction or                            gangrene                           K29.50, Unspecified chronic gastritis without  bleeding                           K26.9, Duodenal ulcer, unspecified as acute or                            chronic, without hemorrhage or perforation                           D50.9, Iron deficiency anemia, unspecified CPT copyright 2018 American Medical Association. All rights reserved. The codes documented in this report are preliminary and upon coder review may  be revised to meet current compliance requirements. Vida RiggerMarc Sophiah Rolin, MD 06/20/2018 11:42:04 AM This report has been signed electronically. Number of Addenda: 0

## 2018-06-20 NOTE — Progress Notes (Signed)
PROGRESS NOTE  Emily Farmer AQT:[id number] DOB: 12-24-1955 DOA: 06/18/2018 PCP: Cain Saupe, MD  HPI/Recap of past 24 hours: Emily Farmer is a 63 y.o. female with medical history significant for paroxysmal A. fib on Eliquis, diastolic CHF, type 2 diabetes, hyperlipidemia who presented to [facility] ED via EMS with complaints of shortness of breath and lightheadedness of a few hours duration.  She was working at [employer] when suddenly started feeling significantly short of breath.  Associated with lightheadedness.  No falls.  States she has had similar symptoms in the past when she was initially diagnosed with A. fib a year ago.  Admits to compliance with her medications.  Denies any chest pain.  She was in her usual state of health prior to this.  Denies lower extremity edema or orthopnea.  No improving or worsening factors.  Reports a fall at home about 2 weeks ago, hit her head.  Self reports had head CT which was unremarkable.  Admits to occasional dark stool but states she she takes iron supplement.  First colonoscopy was a year ago, seen by [facility].  In the ED in A. fib RVR.  Was given a dose of Lopressor with improvement but recurrence.  Low hemoglobin 7.2 with baseline hemoglobin of 10 in September 2019.  Negative FOBT.  Lab studies remarkable for profound iron deficiency.  TRH is asked to admit.  06/19/18: Reports occasional left-sided chest pain, dull, lasting less than 5 minutes.  GI consulted today for possible endoscopy.  N.p.o. until evaluated by GI.  06/20/2018: Patient seen and examined at bedside.  No acute events overnight.  She denies any melena or hematochezia.  Denies abdominal pain.  Plan for EGD today which revealed chronic gastritis biopsied to rule out H. pylori and Few non-bleeding small cratered and superficial duodenal ulcers with no stigmata of bleeding were found in the duodenal bulb.  Recommendations per GI.   Assessment/Plan: Active Problems:   Symptomatic  anemia  Symptomatic anemia/iron deficiency anemia possibly secondary to chronic gastritis versus nonbleeding duodenal ulcers Presented with hemoglobin of 7.2 with baseline hemoglobin of 10.2 in 02/22/2018 FOBT negative Profound iron deficiency from iron studies Continue ferrous sulfate 325 milligrams twice daily Hg stable post 2 unit PRBC transfusion EGD done today 06/20/2018 which revealed chronic gastritis which was biopsied to rule out H. pylori.  Also few scattered nonbleeding duodenal ulcers. GI recommended Protonix p.o. 40 mg daily particularly if the patient is going to stay on aspirin. Plan for colonoscopy tomorrow Can start clear liquid diet per GI  Improving symptomatic A. fib with RVR post PRBCs transfusion Presented with dyspnea with minimal movement Initially on Cardizem drip and titrate as needed Continue diltiazem CD 240 mg daily as recommended by cardiology Rate is better controlled Continue to monitor vital signs on telemetry unit Cardiology following Eliquis on hold due to possible GI source  Resolved hypokalemia post repletion  Chronic diastolic CHF 2D echo done today revealed preserved LVEF 55 to 60% with wall motion normal Continue strict I's and O's and daily weight Continue current medications  Type 2 diabetes Last A1c done in November 2019 revealed A1c of 9.8 Continue insulin sliding scale Continue long-acting insulin at half dose Clear liquid diet as recommended by GI  Hyperlipidemia Resume home medications LDL 105 on 03/22/2018  Resolved anion gap metabolic acidosis  Resolved pseudohyponatremia suspect secondary to hyperglycemia   .  DVT prophylaxis:  SCDs  Code Status: Full code  Family Communication: None at bedside  Disposition  Plan:  Home when hemodynamically stable and when cardiology and GI signed off.  Consults called: Cardiology and GI      Objective: Vitals:   06/20/18 0829 06/20/18 1053 06/20/18 1136 06/20/18  1146  BP: 102/62 137/63 (!) 98/42 (!) 106/55  Pulse: 72 72 69   Resp: 16 15 (!) 23   Temp: 98.7 F (37.1 C) 98.2 F (36.8 C) 98 F (36.7 C)   TempSrc: Oral Oral Oral   SpO2: 98% 99% 98%   Weight:      Height:        Intake/Output Summary (Last 24 hours) at 06/20/2018 1152 Last data filed at 06/20/2018 1128 Gross per 24 hour  Intake 1405.07 ml  Output -  Net 1405.07 ml   Filed Weights   06/19/18 0100  Weight: 66.7 kg    Exam:  . General: 63 y.o. year-old female well-developed in no acute distress.  Alert and oriented x3. . Cardiovascular: Regular rate and rhythm with no rubs or gallops.  No JVD or thyromegaly. Marland Kitchen. Respiratory: Clear to Auscultation with No Wheezes or Rales.  Good Inspiratory Effort. . Abdomen: Soft nontender nondistended with normal bowel sounds x4 quadrants. . Musculoskeletal: No lower extremity edema. 2/4 pulses in all 4 extremities. . Skin: No ulcerative lesions noted or rashes, . Psychiatry: Mood is appropriate for condition and setting   Data Reviewed: CBC: Recent Labs  Lab 06/18/18 0806 06/19/18 0235 06/20/18 0224  WBC 9.1 8.6 6.3  NEUTROABS 6.2  --   --   HGB 7.2* 8.7* 9.1*  HCT 25.7* 29.5* 29.7*  MCV 74.1* 75.4* 75.8*  PLT 300 264 289   Basic Metabolic Panel: Recent Labs  Lab 06/18/18 0806 06/19/18 0235 06/20/18 0224  NA 133* 137 138  K 4.0 3.1* 4.3  CL 101 104 106  CO2 19* 21* 24  GLUCOSE 351* 103* 104*  BUN 30* 19 12  CREATININE 1.01* 0.74 0.71  CALCIUM 9.0 9.1 9.0  MG  --  1.2*  --    GFR: Estimated Creatinine Clearance: 67.7 mL/min (by C-G formula based on SCr of 0.71 mg/dL). Liver Function Tests: Recent Labs  Lab 06/18/18 0806  AST 15  ALT 15  ALKPHOS 66  BILITOT 0.3  PROT 6.7  ALBUMIN 3.5   No results for input(s): LIPASE, AMYLASE in the last 168 hours. No results for input(s): AMMONIA in the last 168 hours. Coagulation Profile: No results for input(s): INR, PROTIME in the last 168 hours. Cardiac Enzymes: No  results for input(s): CKTOTAL, CKMB, CKMBINDEX, TROPONINI in the last 168 hours. BNP (last 3 results) No results for input(s): PROBNP in the last 8760 hours. HbA1C: No results for input(s): HGBA1C in the last 72 hours. CBG: Recent Labs  Lab 06/19/18 1559 06/19/18 1951 06/20/18 0004 06/20/18 0600 06/20/18 0827  GLUCAP 90 207* 100* 125* 138*   Lipid Profile: No results for input(s): CHOL, HDL, LDLCALC, TRIG, CHOLHDL, LDLDIRECT in the last 72 hours. Thyroid Function Tests: No results for input(s): TSH, T4TOTAL, FREET4, T3FREE, THYROIDAB in the last 72 hours. Anemia Panel: Recent Labs    06/18/18 1235  VITAMINB12 610  FOLATE 42.4  FERRITIN 4*  TIBC 522*  IRON 13*  RETICCTPCT 1.4   Urine analysis:    Component Value Date/Time   COLORURINE YELLOW 01/28/2018 0727   APPEARANCEUR CLEAR 01/28/2018 0727   LABSPEC 1.028 01/28/2018 0727   PHURINE 5.0 01/28/2018 0727   GLUCOSEU >=500 (A) 01/28/2018 0727   HGBUR SMALL (A) 01/28/2018 [id number]  BILIRUBINUR negative 04/05/2018 1047   KETONESUR negative 04/05/2018 1047   KETONESUR NEGATIVE 01/28/2018 0727   PROTEINUR NEGATIVE 01/28/2018 0727   UROBILINOGEN 0.2 04/05/2018 1047   UROBILINOGEN 0.2 01/03/2014 2001   NITRITE Negative 04/05/2018 1047   NITRITE NEGATIVE 01/28/2018 0727   LEUKOCYTESUR Small (1+) (A) 04/05/2018 1047   Sepsis Labs: @LABRCNTIP (procalcitonin:4,lacticidven:4)  ) Recent Results (from the past 240 hour(s))  MRSA PCR Screening     Status: None   Collection Time: 06/19/18  1:05 AM  Result Value Ref Range Status   MRSA by PCR NEGATIVE NEGATIVE Final    Comment:        The GeneXpert MRSA Assay (FDA approved for NASAL specimens only), is one component of a comprehensive MRSA colonization surveillance program. It is not intended to diagnose MRSA infection nor to guide or monitor treatment for MRSA infections. Performed at [facility], [address] [place], [place] [address]       Studies: No  results found.  Scheduled Meds: . [MAR Hold] sodium chloride   Intravenous Once  . [MAR Hold] atorvastatin  40 mg Oral Daily  . [MAR Hold] dicyclomine  20 mg Oral BID  . [MAR Hold] diltiazem  240 mg Oral Daily  . [MAR Hold] famotidine  20 mg Oral BID  . [MAR Hold] fenofibrate  160 mg Oral Daily  . [MAR Hold] insulin aspart  0-9 Units Subcutaneous Q4H  . [MAR Hold] insulin glargine  10 Units Subcutaneous QHS    Continuous Infusions: . sodium chloride 20 mL/hr at 06/20/18 0800     LOS: 2 days     Darlin Drop, MD [facility] Pager [phone]  If 7PM-7AM, please contact night-coverage [email or web address] Password [email or web address] 06/20/2018, 11:52 AM

## 2018-06-20 NOTE — Progress Notes (Signed)
Emily Farmer 11:09 AM  Subjective: Patient doing well without any new complaints and no signs of GI blood loss  Objective: Vital signs stable afebrile no acute distress exam please see preassessment evaluation labs okay  Assessment: Iron deficiency  Plan: Okay to proceed with endoscopy with anesthesia assistance and if nondiagnostic colonoscopy tomorrow  [facility]  Pager [phone] After 5PM or if no answer call [phone]

## 2018-06-20 NOTE — Anesthesia Preprocedure Evaluation (Signed)
Anesthesia Evaluation    Reviewed: Allergy & Precautions, NPO status , Patient's Chart, lab work & pertinent test results  Airway Mallampati: I  TM Distance: >3 FB Neck ROM: Full    Dental  (+) Edentulous Lower, Edentulous Upper   Pulmonary    breath sounds clear to auscultation       Cardiovascular hypertension, Pt. on home beta blockers + CAD and +CHF  + dysrhythmias Atrial Fibrillation  Rhythm:Regular Rate:Normal     Neuro/Psych    GI/Hepatic Neg liver ROS, GERD  Medicated,  Endo/Other  diabetes, Type 2, Insulin Dependent  Renal/GU negative Renal ROS     Musculoskeletal  (+) Arthritis ,   Abdominal Normal abdominal exam  (+)   Peds  Hematology negative hematology ROS (+)   Anesthesia Other Findings   Reproductive/Obstetrics                             Anesthesia Physical Anesthesia Plan  ASA: III  Anesthesia Plan: MAC   Post-op Pain Management:    Induction: Intravenous  PONV Risk Score and Plan: 2 and Propofol infusion and Ondansetron  Airway Management Planned: Natural Airway and Nasal Cannula  Additional Equipment: None  Intra-op Plan:   Post-operative Plan:   Informed Consent: I have reviewed the patients History and Physical, chart, labs and discussed the procedure including the risks, benefits and alternatives for the proposed anesthesia with the patient or authorized representative who has indicated his/her understanding and acceptance.     Plan Discussed with: CRNA  Anesthesia Plan Comments:         Anesthesia Quick Evaluation

## 2018-06-20 NOTE — Progress Notes (Signed)
Patient ID: Emily Farmer, female   DOB: Feb 23, 1956, 63 y.o.   MRN: [id number]   Progress Note  Patient Name: Emily Farmer Date of Encounter: 06/20/2018  Primary Cardiologist: No primary care provider on file.   Subjective   Patient has been in sinus rhythm consistently today.  Now on po diltiazem.  Hgb trending up.      She got 2 units PRBCs initially.  FOBT negative.  Profound Fe deficiency.    Apixaban on hold.  Seen by GI, plan for scopes today.   Inpatient Medications    Scheduled Meds: . sodium chloride   Intravenous Once  . atorvastatin  40 mg Oral Daily  . dicyclomine  20 mg Oral BID  . diltiazem  240 mg Oral Daily  . famotidine  20 mg Oral BID  . fenofibrate  160 mg Oral Daily  . insulin aspart  0-9 Units Subcutaneous Q4H  . insulin glargine  10 Units Subcutaneous QHS   Continuous Infusions: . sodium chloride 20 mL/hr at 06/19/18 2247   PRN Meds: acetaminophen   Vital Signs    Vitals:   06/19/18 1952 06/19/18 2300 06/20/18 0300 06/20/18 0829  BP: (!) 120/98 103/63 114/65 102/62  Pulse: 66 62 63 72  Resp: 17 13 17 16   Temp: 98.8 F (37.1 C) 97.9 F (36.6 C) 98.1 F (36.7 C) 98.7 F (37.1 C)  TempSrc: Oral Oral Oral Oral  SpO2: 97% 98% 99% 98%  Weight:      Height:        Intake/Output Summary (Last 24 hours) at 06/20/2018 0920 Last data filed at 06/19/2018 2247 Gross per 24 hour  Intake 980.88 ml  Output -  Net 980.88 ml   Filed Weights   06/19/18 0100  Weight: 66.7 kg    Telemetry    NSR 70s - Personally Reviewed  Physical Exam   General: NAD Neck: No JVD, no thyromegaly or thyroid nodule.  Lungs: Clear to auscultation bilaterally with normal respiratory effort. CV: Nondisplaced PMI.  Heart regular S1/S2, no S3/S4, no murmur.  No peripheral edema.   Abdomen: Soft, nontender, no hepatosplenomegaly, no distention.  Skin: Intact without lesions or rashes.  Neurologic: Alert and oriented x 3.  Psych: Normal affect. Extremities: No  clubbing or cyanosis.  HEENT: Normal.    Labs    Chemistry Recent Labs  Lab 06/18/18 0806 06/19/18 0235 06/20/18 0224  NA 133* 137 138  K 4.0 3.1* 4.3  CL 101 104 106  CO2 19* 21* 24  GLUCOSE 351* 103* 104*  BUN 30* 19 12  CREATININE 1.01* 0.74 0.71  CALCIUM 9.0 9.1 9.0  PROT 6.7  --   --   ALBUMIN 3.5  --   --   AST 15  --   --   ALT 15  --   --   ALKPHOS 66  --   --   BILITOT 0.3  --   --   GFRNONAA >60 >60 >60  GFRAA >60 >60 >60  ANIONGAP 13 12 8      Hematology Recent Labs  Lab 06/18/18 0806 06/18/18 1235 06/19/18 0235 06/20/18 0224  WBC 9.1  --  8.6 6.3  RBC 3.47* 3.36* 3.91 3.92  HGB 7.2*  --  8.7* 9.1*  HCT 25.7*  --  29.5* 29.7*  MCV 74.1*  --  75.4* 75.8*  MCH 20.7*  --  22.3* 23.2*  MCHC 28.0*  --  29.5* 30.6  RDW 14.6  --  16.7* 16.7*  PLT 300  --  264 289    Cardiac EnzymesNo results for input(s): TROPONINI in the last 168 hours. No results for input(s): TROPIPOC in the last 168 hours.   BNPNo results for input(s): BNP, PROBNP in the last 168 hours.   DDimer No results for input(s): DDIMER in the last 168 hours.   Radiology    No results found.   Assessment/Plan:  1. Atrial fibrillation: History of paroxysmal atrial fibrillation.  In atrial fibrillation with RVR this admission in setting of profound anemia.  She is back in NSR now on po diltiazem.  Echo showed preserved EF 55-60%.  - Continue diltiazem CD 240 mg daily.  - Eliquis on hold for ?GI bleeding => will decide on timing of anticoagulation resumption after GI workup is done.  - Should stop aspirin permanently.  2. Anemia: Profound Fe deficiency anemia, now s/p 2 units PRBCs.  Though FOBT negative, would still be concerned for GI source with Fe deficiency anemia.  She is currently off apixaban and will have endoscopies today per GI.   For questions or updates, please contact [facility] Please consult [email or web address] for contact info under        Signed, Marca Ancona, MD    06/20/2018, 9:20 AM

## 2018-06-20 NOTE — Transfer of Care (Signed)
Immediate Anesthesia Transfer of Care Note  Patient: Emily Farmer  Procedure(s) Performed: ESOPHAGOGASTRODUODENOSCOPY (EGD) WITH PROPOFOL (N/A ) BIOPSY  Patient Location: Endoscopy Unit  Anesthesia Type:MAC  Level of Consciousness: awake, alert  and oriented  Airway & Oxygen Therapy: Patient Spontanous Breathing  Post-op Assessment: Report given to RN and Post -op Vital signs reviewed and stable  Post vital signs: Reviewed and stable  Last Vitals:  Vitals Value Taken Time  BP 98/42 06/20/2018 11:36 AM  Temp 36.7 C 06/20/2018 11:36 AM  Pulse 68 06/20/2018 11:40 AM  Resp 19 06/20/2018 11:40 AM  SpO2 98 % 06/20/2018 11:40 AM  Vitals shown include unvalidated device data.  Last Pain:  Vitals:   06/20/18 1136  TempSrc: Oral  PainSc:          Complications: No apparent anesthesia complications

## 2018-06-20 NOTE — Anesthesia Postprocedure Evaluation (Signed)
Anesthesia Post Note  Patient: Emily Farmer  Procedure(s) Performed: ESOPHAGOGASTRODUODENOSCOPY (EGD) WITH PROPOFOL (N/A ) BIOPSY     Patient location during evaluation: PACU Anesthesia Type: MAC Level of consciousness: awake and alert Pain management: pain level controlled Vital Signs Assessment: post-procedure vital signs reviewed and stable Respiratory status: spontaneous breathing, nonlabored ventilation, respiratory function stable and patient connected to nasal cannula oxygen Cardiovascular status: stable and blood pressure returned to baseline Postop Assessment: no apparent nausea or vomiting Anesthetic complications: no    Last Vitals:  Vitals:   06/20/18 1146 06/20/18 1200  BP: (!) 106/55 119/69  Pulse:    Resp:    Temp:  36.7 C  SpO2:  96%    Last Pain:  Vitals:   06/20/18 1200  TempSrc: Oral  PainSc:                  Effie Berkshire

## 2018-06-21 ENCOUNTER — Inpatient Hospital Stay (HOSPITAL_COMMUNITY): Payer: Self-pay | Admitting: Certified Registered Nurse Anesthetist

## 2018-06-21 ENCOUNTER — Encounter (HOSPITAL_COMMUNITY): Payer: Self-pay

## 2018-06-21 ENCOUNTER — Encounter (HOSPITAL_COMMUNITY)
Admission: EM | Disposition: A | Discharge status: Home / Self Care | Payer: Self-pay | Source: Home / Self Care | Attending: Internal Medicine

## 2018-06-21 DIAGNOSIS — I3139 Other pericardial effusion (noninflammatory): Secondary | ICD-10-CM | POA: Diagnosis present

## 2018-06-21 DIAGNOSIS — I313 Pericardial effusion (noninflammatory): Secondary | ICD-10-CM

## 2018-06-21 HISTORY — PX: COLONOSCOPY WITH PROPOFOL: SHX5780

## 2018-06-21 LAB — GLUCOSE, CAPILLARY
GLUCOSE-CAPILLARY: 147 mg/dL — AB (ref 70–99)
GLUCOSE-CAPILLARY: 94 mg/dL (ref 70–99)
Glucose-Capillary: 128 mg/dL — ABNORMAL HIGH (ref 70–99)
Glucose-Capillary: 128 mg/dL — ABNORMAL HIGH (ref 70–99)
Glucose-Capillary: 137 mg/dL — ABNORMAL HIGH (ref 70–99)
Glucose-Capillary: 139 mg/dL — ABNORMAL HIGH (ref 70–99)
Glucose-Capillary: 191 mg/dL — ABNORMAL HIGH (ref 70–99)
Glucose-Capillary: 94 mg/dL (ref 70–99)

## 2018-06-21 LAB — BASIC METABOLIC PANEL
ANION GAP: 3 — AB (ref 5–15)
BUN: 13 mg/dL (ref 8–23)
CALCIUM: 9.4 mg/dL (ref 8.9–10.3)
CO2: 29 mmol/L (ref 22–32)
Chloride: 103 mmol/L (ref 98–111)
Creatinine, Ser: 0.81 mg/dL (ref 0.44–1.00)
GFR calc Af Amer: >60 mL/min (ref >60)
GFR calc non Af Amer: >60 mL/min (ref >60)
Glucose, Bld: 147 mg/dL — ABNORMAL HIGH (ref 70–99)
Potassium: 4 mmol/L (ref 3.5–5.1)
Sodium: 135 mmol/L (ref 135–145)

## 2018-06-21 LAB — CBC
HCT: 34 % — ABNORMAL LOW (ref 36.0–46.0)
Hemoglobin: 10.2 g/dL — ABNORMAL LOW (ref 12.0–15.0)
MCH: 23.1 pg — ABNORMAL LOW (ref 26.0–34.0)
MCHC: 30 g/dL (ref 30.0–36.0)
MCV: 76.9 fL — ABNORMAL LOW (ref 80.0–100.0)
Platelets: 350 10*3/uL (ref 150–400)
RBC: 4.42 MIL/uL (ref 3.87–5.11)
RDW: 17 % — ABNORMAL HIGH (ref 11.5–15.5)
WBC: 8.3 10*3/uL (ref 4.0–10.5)
nRBC: 0 % (ref 0.0–0.2)

## 2018-06-21 SURGERY — COLONOSCOPY WITH PROPOFOL
Anesthesia: Monitor Anesthesia Care

## 2018-06-21 MED ORDER — LACTATED RINGERS IV SOLN
INTRAVENOUS | Status: DC | PRN
  Administered 2018-06-21: 14:00:00 via INTRAVENOUS

## 2018-06-21 MED ORDER — APIXABAN 5 MG PO TABS
5.0000 mg | ORAL_TABLET | Freq: Two times a day (BID) | ORAL | Status: DC
  Administered 2018-06-21 – 2018-06-24 (×6): 5 mg via ORAL
  Filled 2018-06-21 (×6): qty 1

## 2018-06-21 MED ORDER — PROPOFOL 500 MG/50ML IV EMUL
INTRAVENOUS | Status: DC | PRN
  Administered 2018-06-21: 100 ug/kg/min via INTRAVENOUS

## 2018-06-21 MED ORDER — METOPROLOL TARTRATE 5 MG/5ML IV SOLN
INTRAVENOUS | Status: AC
  Filled 2018-06-21: qty 5

## 2018-06-21 MED ORDER — PROPOFOL 10 MG/ML IV BOLUS
INTRAVENOUS | Status: DC | PRN
  Administered 2018-06-21 (×2): 10 mg via INTRAVENOUS

## 2018-06-21 MED ORDER — PHENYLEPHRINE 40 MCG/ML (10ML) SYRINGE FOR IV PUSH (FOR BLOOD PRESSURE SUPPORT)
PREFILLED_SYRINGE | INTRAVENOUS | Status: DC | PRN
  Administered 2018-06-21 (×4): 80 ug via INTRAVENOUS

## 2018-06-21 MED ORDER — FERROUS SULFATE 325 (65 FE) MG PO TABS
325.0000 mg | ORAL_TABLET | Freq: Two times a day (BID) | ORAL | Status: DC
  Administered 2018-06-21 – 2018-06-24 (×6): 325 mg via ORAL
  Filled 2018-06-21 (×6): qty 1

## 2018-06-21 MED ORDER — METOPROLOL TARTRATE 5 MG/5ML IV SOLN
5.0000 mg | INTRAVENOUS | Status: DC | PRN
  Administered 2018-06-21: 5 mg via INTRAVENOUS
  Filled 2018-06-21: qty 5

## 2018-06-21 SURGICAL SUPPLY — 22 items

## 2018-06-21 NOTE — Discharge Instructions (Addendum)
Information on my medicine - ELIQUIS (apixaban)  Why was Eliquis prescribed for you? Eliquis was prescribed for you to reduce the risk of a blood clot forming that can cause a stroke if you have a medical condition called atrial fibrillation (a type of irregular heartbeat).  What do You need to know about Eliquis ? Take your Eliquis TWICE DAILY - one tablet in the morning and one tablet in the evening with or without food. If you have difficulty swallowing the tablet whole please discuss with your pharmacist how to take the medication safely.  Take Eliquis exactly as prescribed by your doctor and DO NOT stop taking Eliquis without talking to the doctor who prescribed the medication.  Stopping may increase your risk of developing a stroke.  Refill your prescription before you run out.  After discharge, you should have regular check-up appointments with your healthcare provider that is prescribing your Eliquis.  In the future your dose may need to be changed if your kidney function or weight changes by a significant amount or as you get older.  What do you do if you miss a dose? If you miss a dose, take it as soon as you remember on the same day and resume taking twice daily.  Do not take more than one dose of ELIQUIS at the same time to make up a missed dose.  Important Safety Information A possible side effect of Eliquis is bleeding. You should call your healthcare provider right away if you experience any of the following: ? Bleeding from an injury or your nose that does not stop. ? Unusual colored urine (red or dark brown) or unusual colored stools (red or black). ? Unusual bruising for unknown reasons. ? A serious fall or if you hit your head (even if there is no bleeding).  Some medicines may interact with Eliquis and might increase your risk of bleeding or clotting while on Eliquis. To help avoid this, consult your healthcare provider or pharmacist prior to using any new  prescription or non-prescription medications, including herbals, vitamins, non-steroidal anti-inflammatory drugs (NSAIDs) and supplements.  This website has more information on Eliquis (apixaban): http://[email or web address].com/eliquis/home  Additional discharge instructions  Please get your medications reviewed and adjusted by your Primary MD.  Please request your Primary MD to go over all Hospital Tests and Procedure/Radiological results at the follow up, please get all Hospital records sent to your Prim MD by signing hospital release before you go home.  If you had Pneumonia of Lung problems at the Hospital: Please get a 2 view Chest X ray done in 6-8 weeks after hospital discharge or sooner if instructed by your Primary MD.  If you have Congestive Heart Failure: Please call your Cardiologist or Primary MD anytime you have any of the following symptoms:  1) 3 pound weight gain in 24 hours or 5 pounds in 1 week  2) shortness of breath, with or without a dry hacking cough  3) swelling in the hands, feet or stomach  4) if you have to sleep on extra pillows at night in order to breathe  Follow cardiac low salt diet and 1.5 lit/day fluid restriction.  If you have diabetes Accuchecks 4 times/day, Once in AM empty stomach and then before each meal. Log in all results and show them to your primary doctor at your next visit. If any glucose reading is under 80 or above 300 call your primary MD immediately.  If you have Seizure/Convulsions/Epilepsy: Please do not drive, operate heavy   machinery, participate in activities at heights or participate in high speed sports until you have seen by Primary MD or a Neurologist and advised to do so again.  If you had Gastrointestinal Bleeding: Please ask your Primary MD to check a complete blood count within one week of discharge or at your next visit. Your endoscopic/colonoscopic biopsies that are pending at the time of discharge, will also need to followed by  your Primary MD.  Get Medicines reviewed and adjusted. Please take all your medications with you for your next visit with your Primary MD  Please request your Primary MD to go over all hospital tests and procedure/radiological results at the follow up, please ask your Primary MD to get all Hospital records sent to his/her office.  If you experience worsening of your admission symptoms, develop shortness of breath, life threatening emergency, suicidal or homicidal thoughts you must seek medical attention immediately by calling 911 or calling your MD immediately  if symptoms less severe.  You must read complete instructions/literature along with all the possible adverse reactions/side effects for all the Medicines you take and that have been prescribed to you. Take any new Medicines after you have completely understood and accpet all the possible adverse reactions/side effects.   Do not drive or operate heavy machinery when taking Pain medications.   Do not take more than prescribed Pain, Sleep and Anxiety Medications  Special Instructions: If you have smoked or chewed Tobacco  in the last 2 yrs please stop smoking, stop any regular Alcohol  and or any Recreational drug use.  Wear Seat belts while driving.  Please note You were cared for by a hospitalist during your hospital stay. If you have any questions about your discharge medications or the care you received while you were in the hospital after you are discharged, you can call the unit and asked to speak with the hospitalist on call if the hospitalist that took care of you is not available. Once you are discharged, your primary care physician will handle any further medical issues. Please note that NO REFILLS for any discharge medications will be authorized once you are discharged, as it is imperative that you return to your primary care physician (or establish a relationship with a primary care physician if you do not have one) for your  aftercare needs so that they can reassess your need for medications and monitor your lab values.  You can reach the hospitalist office at phone [phone] or fax [phone]   If you do not have a primary care physician, you can call [phone] for a physician referral.    

## 2018-06-21 NOTE — Progress Notes (Signed)
Patient's colonoscopy was normal.          Her chronic iron deficiency anemia is therefore presumed to be the consequence of the gastritis and duodenal ulceration, noted on endoscopy yesterday.  Recommendations:  1. In my opinion, the patient does not need additional testing such as video capsule endoscopy, unless she remains heme positive or her hemoglobin continues to be a problem despite appropriate iron supplementation and PPI therapy.    2.  There was no blood in the colonic lumen and the patient is okay for discharge at any time from the GI tract standpoint.   3.  I will sign off.  I have ordered a heart healthy carb modified diet for the patient.  4. Have made patient a follow-up appointment with her primary gastroenterologist, Dr. Bosie ClosSchooler, for Feb. 20 at 9 am.  At that time, Hemoccult status and hemoglobin level might be checked, and consideration could be given to repeat endoscopy, especially if she remains Hemoccult positive, to confirm healing of her ulcers.  Note that she was ostensibly on PPI therapy, as well as aspirin and Eliquis, prior to admission, but she admits that her compliance with the Nexium in the month prior to admission was suboptimal.  5.  The patient, if medically stable, should be considered for surveillance colonoscopy in 5 to 10 years in view of a prior history of a 6 mm adenoma having been removed 10 years ago by Dr. Bosie ClosSchooler; he can put the patient on his follow-up list for that purpose, at his discretion.  Please call me if you have any questions with respect to this patient's management.  Florencia Reasonsobert V. Rodriques Badie, M.D. Pager [phone] If no answer or after 5 PM call [phone]

## 2018-06-21 NOTE — Progress Notes (Signed)
PROGRESS NOTE  Emily BoehringerRosemary Twombly NWG:[id number] DOB: Mar 04, 1956 DOA: 06/18/2018 PCP: Cain SaupeFulp, Cammie, MD  HPI/Recap of past 24 hours: Emily Farmer is a 63 y.o. female with medical history significant for paroxysmal A. fib on Eliquis, chronic diastolic CHF, type 2 diabetes, hyperlipidemia who [facility] with complaints of shortness of breath and lightheadedness of a few hours duration.  Admits to occasional dark stool but states that she takes iron supplement. First colonoscopy was a year ago, seen by [facility].    In the ED presented with A. fib RVR.  Was given a dose of Lopressor with improvement but recurrence.  Cardiology consulted. Low hemoglobin 7.2 with baseline hemoglobin of 10. Negative FOBT.  Lab studies remarkable for profound iron deficiency.  Transfused 2 unit PRBCs and consulted GI for endoscopy.  Iron supplement held until after endoscopies.  EGD done on 06/20/2018 revealed chronic gastritis biopsied to rule out H. Pylori, a few non-bleeding small cratered and superficial duodenal ulcers with no stigmata of bleeding found in the duodenal bulb.  Colonoscopy planned on 06/21/2018.  06/21/2018: Patient seen and examined her bedside.  No acute events overnight.  She has no new complaints.  She denies any chest pain, palpitations or dyspnea.  No dizziness, melena or hematochezia.  Plan for colonoscopy today.   Assessment/Plan: Active Problems:   Paroxysmal A-fib (HCC)   Symptomatic anemia   Pericardial effusion  Symptomatic anemia/severe iron deficiency anemia possibly secondary to chronic gastritis, nonbleeding duodenal ulcers Presented with hemoglobin of 7.2 with baseline hemoglobin of 10.2 in 02/22/2018 FOBT negative Profound iron deficiency from iron studies Restart after endoscopies ferrous sulfate 325 milligrams twice daily Hg stable post 2 unit PRBC transfusion EGD done 06/20/2018 which revealed chronic gastritis which was biopsied to rule out H. pylori.  Also few scattered  nonbleeding duodenal ulcers. GI recommended Protonix p.o. 40 mg daily. Stop aspirin indefinitely as recommended by cardiology Plan for colonoscopy today Diet per GI recommendations  Improving symptomatic A. fib with RVR post PRBCs transfusion Presented with dyspnea with minimal movement, resolving Initially on Cardizem drip Continue diltiazem CD 240 mg daily as recommended by cardiology Rate is better controlled Continue to monitor vital signs on telemetry unit Cardiology following Eliquis on hold due to possible GI source Resume Eliquis after endoscopies prior to discharge  Resolved hypokalemia post repletion  Chronic diastolic CHF 2D echo done on 06/19/2018 revealed preserved LVEF 55 to 60% with wall motion normal Continue strict I's and O's and daily weight Continue cardiac medications  Type 2 diabetes Last A1c done in November 2019 revealed A1c of 9.8 Continue insulin sliding scale Continue long-acting insulin at half dose Resume diet as recommended by GI  Hyperlipidemia LDL 105 on 03/22/2018 Resume Lipitor and fenofibrate  Resolved anion gap metabolic acidosis  Resolved pseudohyponatremia suspect secondary to hyperglycemia   .  DVT prophylaxis:  SCDs/resume Eliquis prior to discharge if okay with GI.  Continue to hold off aspirin indefinitely as recommended by cardiology.  Code Status: Full code  Family Communication: None at bedside  Disposition Plan:  Home when GI and cardiology sign off. Consults called: Cardiology and GI      Objective: Vitals:   06/21/18 0300 06/21/18 0732 06/21/18 1132 06/21/18 1322  BP: (!) 98/57 124/66 106/63 (!) 118/52  Pulse: 70  74 77  Resp: 14 14 14 16   Temp: 97.7 F (36.5 C) 97.9 F (36.6 C) 98 F (36.7 C) 98.2 F (36.8 C)  TempSrc: Oral Oral Oral  Oral  SpO2: 99% 99% 98% 97%  Weight:    64 kg  Height:    5\' 3"  (1.6 m)    Intake/Output Summary (Last 24 hours) at 06/21/2018 1355 Last data filed at 06/21/2018  1200 Gross per 24 hour  Intake 720 ml  Output -  Net 720 ml   Filed Weights   06/19/18 0100 06/21/18 1322  Weight: 66.7 kg 64 kg    Exam:  . General: 63 y.o. year-old female well developed well-nourished in no acute distress.  Alert and oriented x3.   . Cardiovascular: Regular rate and rhythm with no rubs or gallops.  No JVD or thyromegaly noted.   Marland Kitchen. Respiratory: To auscultation with no wheezes or rales.  Good inspiratory effort. . Abdomen: Soft nontender nondistended with normal bowel sounds x4 quadrants. . Musculoskeletal: No lower extremity edema. 2/4 pulses in all 4 extremities. . Skin: No ulcerative lesions noted or rashes, . Psychiatry: Mood is appropriate for condition and setting   Data Reviewed: CBC: Recent Labs  Lab 06/18/18 0806 06/19/18 0235 06/20/18 0224 06/21/18 0244  WBC 9.1 8.6 6.3 8.3  NEUTROABS 6.2  --   --   --   HGB 7.2* 8.7* 9.1* 10.2*  HCT 25.7* 29.5* 29.7* 34.0*  MCV 74.1* 75.4* 75.8* 76.9*  PLT 300 264 289 350   Basic Metabolic Panel: Recent Labs  Lab 06/18/18 0806 06/19/18 0235 06/20/18 0224 06/21/18 0244  NA 133* 137 138 135  K 4.0 3.1* 4.3 4.0  CL 101 104 106 103  CO2 19* 21* 24 29  GLUCOSE 351* 103* 104* 147*  BUN 30* 19 12 13   CREATININE 1.01* 0.74 0.71 0.81  CALCIUM 9.0 9.1 9.0 9.4  MG  --  1.2*  --   --    GFR: Estimated Creatinine Clearance: 65.6 mL/min (by C-G formula based on SCr of 0.81 mg/dL). Liver Function Tests: Recent Labs  Lab 06/18/18 0806  AST 15  ALT 15  ALKPHOS 66  BILITOT 0.3  PROT 6.7  ALBUMIN 3.5   No results for input(s): LIPASE, AMYLASE in the last 168 hours. No results for input(s): AMMONIA in the last 168 hours. Coagulation Profile: No results for input(s): INR, PROTIME in the last 168 hours. Cardiac Enzymes: No results for input(s): CKTOTAL, CKMB, CKMBINDEX, TROPONINI in the last 168 hours. BNP (last 3 results) No results for input(s): PROBNP in the last 8760 hours. HbA1C: No results for  input(s): HGBA1C in the last 72 hours. CBG: Recent Labs  Lab 06/20/18 2117 06/21/18 0008 06/21/18 0426 06/21/18 0731 06/21/18 1131  GLUCAP 200* 137* 128* 147* 128*   Lipid Profile: No results for input(s): CHOL, HDL, LDLCALC, TRIG, CHOLHDL, LDLDIRECT in the last 72 hours. Thyroid Function Tests: No results for input(s): TSH, T4TOTAL, FREET4, T3FREE, THYROIDAB in the last 72 hours. Anemia Panel: No results for input(s): VITAMINB12, FOLATE, FERRITIN, TIBC, IRON, RETICCTPCT in the last 72 hours. Urine analysis:    Component Value Date/Time   COLORURINE YELLOW 01/28/2018 0727   APPEARANCEUR CLEAR 01/28/2018 0727   LABSPEC 1.028 01/28/2018 0727   PHURINE 5.0 01/28/2018 0727   GLUCOSEU >=500 (A) 01/28/2018 0727   HGBUR SMALL (A) 01/28/2018 0727   BILIRUBINUR negative 04/05/2018 1047   KETONESUR negative 04/05/2018 1047   KETONESUR NEGATIVE 01/28/2018 0727   PROTEINUR NEGATIVE 01/28/2018 0727   UROBILINOGEN 0.2 04/05/2018 1047   UROBILINOGEN 0.2 01/03/2014 2001   NITRITE Negative 04/05/2018 1047   NITRITE NEGATIVE 01/28/2018 0727   LEUKOCYTESUR Small (1+) (  A) 04/05/2018 1047   Sepsis Labs: @LABRCNTIP (procalcitonin:4,lacticidven:4)  ) Recent Results (from the past 240 hour(s))  MRSA PCR Screening     Status: None   Collection Time: 06/19/18  1:05 AM  Result Value Ref Range Status   MRSA by PCR NEGATIVE NEGATIVE Final    Comment:        The GeneXpert MRSA Assay (FDA approved for NASAL specimens only), is one component of a comprehensive MRSA colonization surveillance program. It is not intended to diagnose MRSA infection nor to guide or monitor treatment for MRSA infections. Performed at [facility], [address] [place], [place] [address]       Studies: No results found.  Scheduled Meds: . [MAR Hold] sodium chloride   Intravenous Once  . [MAR Hold] atorvastatin  40 mg Oral Daily  . [MAR Hold] dicyclomine  20 mg Oral BID  . [MAR Hold] diltiazem  240  mg Oral Daily  . [MAR Hold] fenofibrate  160 mg Oral Daily  . [MAR Hold] insulin aspart  0-9 Units Subcutaneous Q4H  . [MAR Hold] insulin glargine  10 Units Subcutaneous QHS  . [MAR Hold] pantoprazole  40 mg Oral Daily    Continuous Infusions: . sodium chloride       LOS: 3 days     Darlin Drop, MD [facility] Pager [phone]  If 7PM-7AM, please contact night-coverage [email or web address] Password [email or web address] 06/21/2018, 1:55 PM

## 2018-06-21 NOTE — Anesthesia Preprocedure Evaluation (Signed)
Anesthesia Evaluation  Patient identified by MRN, date of birth, ID band Patient awake    Reviewed: Allergy & Precautions, NPO status , Patient's Chart, lab work & pertinent test results  Airway Mallampati: I  TM Distance: >3 FB Neck ROM: Full    Dental   Pulmonary    Pulmonary exam normal        Cardiovascular hypertension, Pt. on medications Normal cardiovascular exam+ dysrhythmias Atrial Fibrillation      Neuro/Psych    GI/Hepatic   Endo/Other  diabetes  Renal/GU      Musculoskeletal   Abdominal   Peds  Hematology   Anesthesia Other Findings   Reproductive/Obstetrics                             Anesthesia Physical Anesthesia Plan  ASA: III  Anesthesia Plan: MAC   Post-op Pain Management:    Induction: Intravenous  PONV Risk Score and Plan: 2 and Treatment may vary due to age or medical condition  Airway Management Planned: Simple Face Mask  Additional Equipment:   Intra-op Plan:   Post-operative Plan:   Informed Consent: I have reviewed the patients History and Physical, chart, labs and discussed the procedure including the risks, benefits and alternatives for the proposed anesthesia with the patient or authorized representative who has indicated his/her understanding and acceptance.     Plan Discussed with: CRNA and Surgeon  Anesthesia Plan Comments:         Anesthesia Quick Evaluation

## 2018-06-21 NOTE — Op Note (Signed)
[facility] Patient Name: Emily BoehringerRosemary Farmer Procedure Date : 06/21/2018 MRN: [id number] Attending MD: Bernette Redbirdobert Tykeem Lanzer , MD Date of Birth: [birth year] CSN: [id number] Age: [age] Admit Type: Inpatient Procedure:                Colonoscopy Indications:              Last colonoscopy: 2009 at which time a small                            adenoma was removed by Dr. Bosie ClosSchooler; Now, iron                            deficiency anemia in a patient with hemorrhagic                            gastritis and duodenal bulbar ulcerations seen on                            egd yesterday by Dr. Ewing SchleinMagod, on ASA and Eliquis as                            outpatient with recent poor compliance with her                            Nexium. Providers:                Bernette Redbirdobert Graylen Noboa, MD, April HoldingJamie Bailey, RN, Zoe LanJenny Kappus,                            RN, Harrington ChallengerHope Parker, Technician Referring MD:              Medicines:                Monitored Anesthesia Care Complications:            No immediate complications. Estimated Blood Loss:     Estimated blood loss: none. Procedure:                Pre-Anesthesia Assessment:                           - Prior to the procedure, a History and Physical                            was performed, and patient medications and                            allergies were reviewed. The patient's tolerance of                            previous anesthesia was also reviewed. The risks                            and benefits of the procedure and the sedation  options and risks were discussed with the patient.                            All questions were answered, and informed consent                            was obtained. Prior Anticoagulants: The patient has                            taken Eliquis (apixaban), last dose was 3 days                            prior to procedure. ASA Grade Assessment: III - A                            patient with severe systemic  disease. After                            reviewing the risks and benefits, the patient was                            deemed in satisfactory condition to undergo the                            procedure.                           After obtaining informed consent, the colonoscope                            was passed under direct vision. Throughout the                            procedure, the patient's blood pressure, pulse, and                            oxygen saturations were monitored continuously. The                            CF-HQ190L ([id number]) Olympus colonoscope was                            introduced through the anus and advanced to the the                            terminal ileum. The colonoscopy was performed                            without difficulty. The patient tolerated the                            procedure well. The quality of the bowel  preparation was excellent and photodocumented in                            all segments. Scope In: 2:24:47 PM Scope Out: 2:39:57 PM Scope Withdrawal Time: 0 hours 11 minutes 5 seconds  Total Procedure Duration: 0 hours 15 minutes 10 seconds  Findings:      The perianal and digital rectal examinations were normal.      The colon (entire examined portion) appeared normal.      There is no endoscopic evidence of diverticula, inflammation, mass or       polyps in the entire colon. The right colon was reinspected.      The terminal ileum appeared normal.      The retroflexed view of the distal rectum and anal verge was normal and       showed no anal or rectal abnormalities. Impression:               - The entire examined colon is normal. No source of                            iron deficiency anemia seen.                           - The examined portion of the ileum was normal.                           - The distal rectum and anal verge are normal on                            retroflexion view.                            - No specimens collected. Recommendation:           - Repeat colonoscopy in 10 years for surveillance. Procedure Code(s):        --- Professional ---                           [id number], Colonoscopy, flexible; diagnostic, including                            collection of specimen(s) by brushing or washing,                            when performed (separate procedure) Diagnosis Code(s):        --- Professional ---                           D50.9, Iron deficiency anemia, unspecified CPT copyright 2018 [employer] Medical Association. All rights reserved. The codes documented in this report are preliminary and upon coder review may  be revised to meet current compliance requirements. Bernette Redbirdobert Kassidee Narciso, MD 06/21/2018 2:46:03 PM This report has been signed electronically. Number of Addenda: 0

## 2018-06-21 NOTE — Progress Notes (Signed)
ANTICOAGULATION CONSULT NOTE - Initial Consult  Pharmacy Consult for apixaban Indication: atrial fibrillation  No Known Allergies  Patient Measurements: Height: 5\' 3"  (160 cm) Weight: 141 lb (64 kg) IBW/kg (Calculated) : 52.4  Vital Signs: Temp: 98 F (36.7 C) (01/06 1516) Temp Source: Oral (01/06 1516) BP: 132/65 (01/06 1516) Pulse Rate: 67 (01/06 1455)  Labs: Recent Labs    06/19/18 0235 06/20/18 0224 06/21/18 0244  HGB 8.7* 9.1* 10.2*  HCT 29.5* 29.7* 34.0*  PLT 264 289 350  CREATININE 0.74 0.71 0.81    Estimated Creatinine Clearance: 65.6 mL/min (by C-G formula based on SCr of 0.81 mg/dL).   Medical History: Past Medical History:  Diagnosis Date  . Arthritis   . Atrial fibrillation (HCC)    a. 09/2016 in setting of pancreatitis;  b. 09/2016 Echo: EF 65-60%, no rwma, Gr2 DD, mild MR, triv TR, PASP 30mmHg;  CHA2DS2VASc = 4-->Eliquis 5mg  BID.  Marland Kitchen. Chronic diastolic CHF (congestive heart failure) (HCC) 01/28/2018  . Diabetes mellitus   . Gout   . Hyperlipidemia   . Hypertension   . Hypertriglyceridemia   . Pancreatitis    a. 09/2016 - Triglycerides 1,392 on admission.    Medications:  Scheduled:  . sodium chloride   Intravenous Once  . apixaban  5 mg Oral BID  . atorvastatin  40 mg Oral Daily  . dicyclomine  20 mg Oral BID  . diltiazem  240 mg Oral Daily  . fenofibrate  160 mg Oral Daily  . ferrous sulfate  325 mg Oral BID WC  . insulin aspart  0-9 Units Subcutaneous Q4H  . insulin glargine  10 Units Subcutaneous QHS  . pantoprazole  40 mg Oral Daily    Assessment: 62 yof with PAF and severe anemia secondary to GI bleed. EGD yesterday shows gastritis with healed ulcers - okay to resume PTA apixaban.  Given age<80, wt 64 kg, and Scr<1.5, dose is appropriate at 5 mg twice daily. Hgb 10.2, plt 350. No s/sx of bleeding.   Goal of Therapy:  Monitor platelets by anticoagulation protocol: Yes   Plan:  Continue apixaban 5 mg twice daily  Monitor CBC, s/sx  of bleeding   Sherron MondayKimberly Toia Micale, PharmD, BCCCP Clinical Pharmacist  Pager: [phone] Phone: [phone] 06/21/2018,4:12 PM

## 2018-06-21 NOTE — Transfer of Care (Signed)
Immediate Anesthesia Transfer of Care Note  Patient: Emily Farmer  Procedure(s) Performed: COLONOSCOPY WITH PROPOFOL (N/A )  Patient Location: Endoscopy Unit  Anesthesia Type:MAC  Level of Consciousness: awake, alert  and oriented  Airway & Oxygen Therapy: Patient Spontanous Breathing  Post-op Assessment: Report given to RN, Post -op Vital signs reviewed and stable and Patient moving all extremities X 4  Post vital signs: Reviewed and stable  Last Vitals:  Vitals Value Taken Time  BP    Temp    Pulse 65 06/21/2018  2:46 PM  Resp 8 06/21/2018  2:46 PM  SpO2 98 % 06/21/2018  2:46 PM  Vitals shown include unvalidated device data.  Last Pain:  Vitals:   06/21/18 1322  TempSrc: Oral  PainSc: 0-No pain         Complications: No apparent anesthesia complications

## 2018-06-21 NOTE — Anesthesia Postprocedure Evaluation (Signed)
Anesthesia Post Note  Patient: Yuma Blucher  Procedure(s) Performed: COLONOSCOPY WITH PROPOFOL (N/A )     Patient location during evaluation: PACU Anesthesia Type: MAC Level of consciousness: awake and alert Pain management: pain level controlled Vital Signs Assessment: post-procedure vital signs reviewed and stable Respiratory status: spontaneous breathing, nonlabored ventilation, respiratory function stable and patient connected to nasal cannula oxygen Cardiovascular status: stable and blood pressure returned to baseline Postop Assessment: no apparent nausea or vomiting Anesthetic complications: no    Last Vitals:  Vitals:   06/21/18 1455 06/21/18 1516  BP: (!) 102/42 132/65  Pulse: 67   Resp: 15 18  Temp:  36.7 C  SpO2: 98% 99%    Last Pain:  Vitals:   06/21/18 1516  TempSrc: Oral  PainSc: 0-No pain                 Ezrah Dembeck DAVID

## 2018-06-21 NOTE — Interval H&P Note (Signed)
History and Physical Interval Note:  06/21/2018 2:15 PM  Emily Farmer  has presented today for surgery, with the diagnosis of anemia  The various methods of treatment have been discussed with the patient. After consideration of risks, benefits and other options for treatment, the patient has consented to  Procedure(s): COLONOSCOPY WITH PROPOFOL (N/A) as a surgical intervention .  The patient's history has been reviewed, patient examined, no change in status, stable for surgery.  I have reviewed the patient's chart and labs.  Questions were answered to the patient's satisfaction.     Katy Fitch Niko Jakel

## 2018-06-21 NOTE — Progress Notes (Signed)
DAILY PROGRESS NOTE   Patient Name: Emily Farmer Date of Encounter: 06/21/2018  Chief Complaint   No issues overnight  Patient Profile   63 yo female with PAF, admitted for severe anemia likely secondary to GI bleed.  Subjective   Maintaining sinus rhythm with PAC's. EGD yesterday shows gastritis with healed ulcers. Seems a likely source of her bleeding. Plan for colonoscopy today.  Objective   Vitals:   06/20/18 1200 06/20/18 1600 06/20/18 2034 06/21/18 0300  BP: 119/69 120/63 131/70 (!) 98/57  Pulse:    70  Resp:  18 18 14   Temp: 98 F (36.7 C) 98.7 F (37.1 C) 98.1 F (36.7 C) 97.7 F (36.5 C)  TempSrc: Oral Oral Oral Oral  SpO2: 96% 96%  99%  Weight:      Height:        Intake/Output Summary (Last 24 hours) at 06/21/2018 16100832 Last data filed at 06/20/2018 1700 Gross per 24 hour  Intake 1199.89 ml  Output -  Net 1199.89 ml   Filed Weights   06/19/18 0100  Weight: 66.7 kg    Physical Exam   General appearance: alert and no distress Lungs: clear to auscultation bilaterally Heart: regular rate and rhythm, S1, S2 normal, no murmur, click, rub or gallop Extremities: extremities normal, atraumatic, no cyanosis or edema Neurologic: Grossly normal  Inpatient Medications    Scheduled Meds: . sodium chloride   Intravenous Once  . atorvastatin  40 mg Oral Daily  . dicyclomine  20 mg Oral BID  . diltiazem  240 mg Oral Daily  . fenofibrate  160 mg Oral Daily  . insulin aspart  0-9 Units Subcutaneous Q4H  . insulin glargine  10 Units Subcutaneous QHS  . pantoprazole  40 mg Oral Daily    Continuous Infusions: . sodium chloride      PRN Meds: acetaminophen, metoprolol tartrate   Labs   Results for orders placed or performed during the hospital encounter of 06/18/18 (from the past 48 hour(s))  Glucose, capillary     Status: None   Collection Time: 06/19/18 11:41 AM  Result Value Ref Range   Glucose-Capillary 88 70 - 99 mg/dL  Glucose, capillary      Status: None   Collection Time: 06/19/18  3:59 PM  Result Value Ref Range   Glucose-Capillary 90 70 - 99 mg/dL   Comment 1 Notify RN    Comment 2 Document in Chart   Glucose, capillary     Status: Abnormal   Collection Time: 06/19/18  7:51 PM  Result Value Ref Range   Glucose-Capillary 207 (H) 70 - 99 mg/dL   Comment 1 Notify RN    Comment 2 Document in Chart   Glucose, capillary     Status: Abnormal   Collection Time: 06/20/18 12:04 AM  Result Value Ref Range   Glucose-Capillary 100 (H) 70 - 99 mg/dL  CBC     Status: Abnormal   Collection Time: 06/20/18  2:24 AM  Result Value Ref Range   WBC 6.3 4.0 - 10.5 K/uL   RBC 3.92 3.87 - 5.11 MIL/uL   Hemoglobin 9.1 (L) 12.0 - 15.0 g/dL   HCT 96.029.7 (L) 45.436.0 - 09.846.0 %   MCV 75.8 (L) 80.0 - 100.0 fL   MCH 23.2 (L) 26.0 - 34.0 pg   MCHC 30.6 30.0 - 36.0 g/dL   RDW 11.916.7 (H) 14.711.5 - 82.915.5 %   Platelets 289 150 - 400 K/uL   nRBC 0.0 0.0 - 0.2 %  Comment: Performed at [facility], [address], [place], [place] [address]  Basic metabolic panel     Status: Abnormal   Collection Time: 06/20/18  2:24 AM  Result Value Ref Range   Sodium 138 135 - 145 mmol/L   Potassium 4.3 3.5 - 5.1 mmol/L   Chloride 106 98 - 111 mmol/L   CO2 24 22 - 32 mmol/L   Glucose, Bld 104 (H) 70 - 99 mg/dL   BUN 12 8 - 23 mg/dL   Creatinine, Ser 8.30 0.44 - 1.00 mg/dL   Calcium 9.0 8.9 - 94.0 mg/dL   GFR calc non Af Amer >60 >60 mL/min   GFR calc Af Amer >60 >60 mL/min   Anion gap 8 5 - 15    Comment: Performed at [facility], [address] [place], [place] [address]  Glucose, capillary     Status: Abnormal   Collection Time: 06/20/18  6:00 AM  Result Value Ref Range   Glucose-Capillary 125 (H) 70 - 99 mg/dL  Glucose, capillary     Status: Abnormal   Collection Time: 06/20/18  8:27 AM  Result Value Ref Range   Glucose-Capillary 138 (H) 70 - 99 mg/dL  Glucose, capillary     Status: Abnormal   Collection Time: 06/20/18 12:23 PM  Result Value  Ref Range   Glucose-Capillary 137 (H) 70 - 99 mg/dL   Comment 1 Notify RN   Glucose, capillary     Status: Abnormal   Collection Time: 06/20/18  4:53 PM  Result Value Ref Range   Glucose-Capillary 126 (H) 70 - 99 mg/dL   Comment 1 Notify RN    Comment 2 Document in Chart   Glucose, capillary     Status: Abnormal   Collection Time: 06/20/18  8:34 PM  Result Value Ref Range   Glucose-Capillary 196 (H) 70 - 99 mg/dL  Glucose, capillary     Status: Abnormal   Collection Time: 06/20/18  9:17 PM  Result Value Ref Range   Glucose-Capillary 200 (H) 70 - 99 mg/dL  Glucose, capillary     Status: Abnormal   Collection Time: 06/21/18 12:08 AM  Result Value Ref Range   Glucose-Capillary 137 (H) 70 - 99 mg/dL  CBC     Status: Abnormal   Collection Time: 06/21/18  2:44 AM  Result Value Ref Range   WBC 8.3 4.0 - 10.5 K/uL   RBC 4.42 3.87 - 5.11 MIL/uL   Hemoglobin 10.2 (L) 12.0 - 15.0 g/dL   HCT 81.1 (L) 03.1 - 59.4 %   MCV 76.9 (L) 80.0 - 100.0 fL   MCH 23.1 (L) 26.0 - 34.0 pg   MCHC 30.0 30.0 - 36.0 g/dL   RDW 58.5 (H) 92.9 - 24.4 %   Platelets 350 150 - 400 K/uL   nRBC 0.0 0.0 - 0.2 %    Comment: Performed at [facility], [address], [place], [place] [address]  Basic metabolic panel     Status: Abnormal   Collection Time: 06/21/18  2:44 AM  Result Value Ref Range   Sodium 135 135 - 145 mmol/L   Potassium 4.0 3.5 - 5.1 mmol/L   Chloride 103 98 - 111 mmol/L   CO2 29 22 - 32 mmol/L   Glucose, Bld 147 (H) 70 - 99 mg/dL   BUN 13 8 - 23 mg/dL   Creatinine, Ser 8.17 0.44 - 1.00 mg/dL   Calcium 9.4 8.9 - 71.1 mg/dL  GFR calc non Af Amer >60 >60 mL/min   GFR calc Af Amer >60 >60 mL/min   Anion gap 3 (L) 5 - 15    Comment: Performed at [facility], [address] [place], [place] [address]  Glucose, capillary     Status: Abnormal   Collection Time: 06/21/18  4:26 AM  Result Value Ref Range   Glucose-Capillary 128 (H) 70 - 99 mg/dL  Glucose, capillary     Status:  Abnormal   Collection Time: 06/21/18  7:31 AM  Result Value Ref Range   Glucose-Capillary 147 (H) 70 - 99 mg/dL    ECG   N/A  Telemetry   Sinus rhythm with PAC's - Personally Reviewed  Radiology    No results found.  Cardiac Studies   LV EF: 55% -   60%  ------------------------------------------------------------------- Indications:      Atrial fibrillation - currently SR 427.31.  ------------------------------------------------------------------- History:   Risk factors:  Hypertension. Diabetes mellitus. Dyslipidemia.  ------------------------------------------------------------------- Study Conclusions  - Left ventricle: The cavity size was normal. Wall thickness was   normal. Systolic function was normal. The estimated ejection   fraction was in the range of 55% to 60%. Wall motion was normal;   there were no regional wall motion abnormalities. The study is   not technically sufficient to allow evaluation of LV diastolic   function. - Aortic valve: Trileaflet; mildly thickened, mildly calcified   leaflets. - Mitral valve: There was mild regurgitation. - Right atrium: The atrium was mildly dilated. - Pulmonary arteries: Systolic pressure was mildly increased. PA   peak pressure: 34 mm Hg (S). - Pericardium, extracardiac: A small pericardial effusion was   identified. There was no evidence of hemodynamic compromise.  Assessment   Active Problems:   Paroxysmal A-fib (HCC)   Symptomatic anemia   Pericardial effusion   Plan   1. Feels better - maintaining sinus rhythm. Hemoglobin today is 10.2. Plan for colonoscopy. Agree with holding aspirin indefinitely and avoiding NSAIDS. Would resume Eliquis for afib at discharge, possibly later today.  Time Spent Directly with Patient:  I have spent a total of 25 minutes with the patient reviewing hospital notes, telemetry, EKGs, labs and examining the patient as well as establishing an assessment and plan that  was discussed personally with the patient.  > 50% of time was spent in direct patient care.  Length of Stay:  LOS: 3 days   Chrystie Nose C. , MD, [facility] CenterFACC, FACP  [place]  [facility]  Medical Director of the [facility] Lipid [facility] &  Cardiovascular Risk Reduction Clinic Diplomate of the American Board of Clinical Lipidology Attending Cardiologist  Direct Dial: [phone]  Fax: [phone]  Website:  [email or web address]   C  06/21/2018, 8:32 AM

## 2018-06-21 NOTE — Anesthesia Procedure Notes (Signed)
Procedure Name: MAC Date/Time: 06/21/2018 2:15 PM Performed by: Harden Mo, CRNA Pre-anesthesia Checklist: Patient identified, Emergency Drugs available, Suction available and Patient being monitored Patient Re-evaluated:Patient Re-evaluated prior to induction Oxygen Delivery Method: Simple face mask Preoxygenation: Pre-oxygenation with 100% oxygen Induction Type: IV induction Placement Confirmation: positive ETCO2 and breath sounds checked- equal and bilateral Dental Injury: Teeth and Oropharynx as per pre-operative assessment

## 2018-06-22 LAB — GLUCOSE, CAPILLARY
GLUCOSE-CAPILLARY: 226 mg/dL — AB (ref 70–99)
GLUCOSE-CAPILLARY: 214 mg/dL — AB (ref 70–99)
Glucose-Capillary: 122 mg/dL — ABNORMAL HIGH (ref 70–99)
Glucose-Capillary: 141 mg/dL — ABNORMAL HIGH (ref 70–99)
Glucose-Capillary: 171 mg/dL — ABNORMAL HIGH (ref 70–99)

## 2018-06-22 MED ORDER — AMIODARONE HCL IN DEXTROSE 360-4.14 MG/200ML-% IV SOLN
30.0000 mg/h | INTRAVENOUS | Status: DC
  Administered 2018-06-22 (×2): 30 mg/h via INTRAVENOUS
  Filled 2018-06-22 (×2): qty 200

## 2018-06-22 MED ORDER — AMIODARONE HCL IN DEXTROSE 360-4.14 MG/200ML-% IV SOLN
60.0000 mg/h | INTRAVENOUS | Status: DC
  Administered 2018-06-22 (×2): 60 mg/h via INTRAVENOUS
  Filled 2018-06-22 (×2): qty 200

## 2018-06-22 MED ORDER — METOPROLOL TARTRATE 25 MG PO TABS
37.5000 mg | ORAL_TABLET | Freq: Two times a day (BID) | ORAL | Status: DC
  Administered 2018-06-22: 37.5 mg via ORAL
  Filled 2018-06-22: qty 1

## 2018-06-22 MED ORDER — AMIODARONE LOAD VIA INFUSION
150.0000 mg | Freq: Once | INTRAVENOUS | Status: AC
  Administered 2018-06-22: 150 mg via INTRAVENOUS
  Filled 2018-06-22: qty 83.34

## 2018-06-22 NOTE — Progress Notes (Signed)
PROGRESS NOTE  Emily Farmer HQI:[id number] DOB: [birth year] DOA: 06/18/2018 PCP: Cain SaupeFulp, Cammie, MD  HPI/Recap of past 24 hours: Emily BoehringerRosemary Truss is a 63 y.o. female with medical history significant for paroxysmal A. fib on Eliquis, chronic diastolic CHF, type 2 diabetes, hyperlipidemia who [facility] with complaints of shortness of breath and lightheadedness of a few hours duration.  Admits to occasional dark stool but states that she takes iron supplement. First colonoscopy was a year ago, seen by [facility].    In the ED presented with A. fib RVR.  Was given a dose of Lopressor with improvement but recurrence.  Cardiology consulted. Low hemoglobin 7.2 with baseline hemoglobin of 10. Negative FOBT.  Lab studies remarkable for profound iron deficiency.  Transfused 2 unit PRBCs and consulted GI for endoscopy.  Iron supplement held until after endoscopies.  EGD done on 06/20/2018 revealed chronic gastritis biopsied to rule out H. Pylori, a few non-bleeding small cratered and superficial duodenal ulcers with no stigmata of bleeding found in the duodenal bulb.  Colonoscopy planned on 06/21/2018.  06/22/2018: No acute complaint no nausea no vomiting.  Brown BM no blood in the stool.  States that she gets short of breath when her heart rate goes faster but while her heart rate was 120 when I was evaluating her denies any shortness of breath.  Assessment/Plan: Active Problems:   Paroxysmal A-fib (HCC)   Symptomatic anemia   Pericardial effusion  Symptomatic anemia/severe iron deficiency anemia possibly secondary to chronic gastritis, nonbleeding duodenal ulcers Presented with hemoglobin of 7.2 with baseline hemoglobin of 10.2 in 02/22/2018 FOBT negative Profound iron deficiency from iron studies Restart ferrous sulfate 325 milligrams twice daily Hg stable post 2 unit PRBC transfusion EGD done 06/20/2018 which revealed chronic gastritis which was biopsied to rule out H. pylori.  Also few scattered  nonbleeding duodenal ulcers. GI recommended Protonix p.o. 40 mg daily. Stop aspirin indefinitely as recommended by cardiology Colonoscopy negative for any active bleeding. Outpatient follow-up with GI recommended.  symptomatic chronic A. fib with RVR Presented with dyspnea with minimal movement, resolving Initially on Cardizem drip Continue diltiazem CD 240 mg daily as recommended by cardiology Is also on 37.5 mg Lopressor twice daily at home, will resume that. Started on IV amiodarone infusion by cardiology appreciate their assistance. Eliquis Resume  Resolved hypokalemia post repletion  Chronic diastolic CHF 2D echo done on 06/19/2018 revealed preserved LVEF 55 to 60% with wall motion normal Continue strict I's and O's and daily weight Continue cardiac medications  Type 2 diabetes Last A1c done in November 2019 revealed A1c of 9.8 Continue insulin sliding scale Continue long-acting insulin at half dose Resume diet as recommended by GI  Hyperlipidemia LDL 105 on 03/22/2018 Resume Lipitor and fenofibrate  Resolved anion gap metabolic acidosis  Resolved pseudohyponatremia suspect secondary to hyperglycemia  DVT prophylaxis:  resume Eliquis  Code Status: Full code  Family Communication: None at bedside  Disposition Plan:  Home when GI and cardiology sign off. Consults called: Cardiology and GI  Objective: Vitals:   06/22/18 0735 06/22/18 0903 06/22/18 1202 06/22/18 1633  BP: (!) 125/54  109/68 (!) 95/55  Pulse: 72 (!) 118 61 (!) 57  Resp: 19  (!) 26 15  Temp: 98 F (36.7 C)  98.2 F (36.8 C) 98 F (36.7 C)  TempSrc: Oral  Oral Oral  SpO2: 99%  99% 97%  Weight:      Height:        Intake/Output  Summary (Last 24 hours) at 06/22/2018 1826 Last data filed at 06/22/2018 1200 Gross per 24 hour  Intake 778.61 ml  Output -  Net 778.61 ml   Filed Weights   06/19/18 0100 06/21/18 1322  Weight: 66.7 kg 64 kg    Exam: General: Appear in mild distress, no  Rash; Oral Mucosa moist. no Abnormal Mass Or lumps Cardiovascular: S1 and S2 Present, aortic systolic Murmur, Respiratory: normal respiratory effort, Bilateral Air entry present and Clear to Auscultation, no Crackles, no wheezes Abdomen: Bowel Sound present, Soft and no tenderness, no hernia Extremities: no Pedal edema, no calf tenderness Neurology: Alert, Awake and Oriented to Time, Place and Person. affect appropriate. Grossly no focal neuro deficit.    Data Reviewed: CBC: Recent Labs  Lab 06/18/18 0806 06/19/18 0235 06/20/18 0224 06/21/18 0244  WBC 9.1 8.6 6.3 8.3  NEUTROABS 6.2  --   --   --   HGB 7.2* 8.7* 9.1* 10.2*  HCT 25.7* 29.5* 29.7* 34.0*  MCV 74.1* 75.4* 75.8* 76.9*  PLT 300 264 289 350   Basic Metabolic Panel: Recent Labs  Lab 06/18/18 0806 06/19/18 0235 06/20/18 0224 06/21/18 0244  NA 133* 137 138 135  K 4.0 3.1* 4.3 4.0  CL 101 104 106 103  CO2 19* 21* 24 29  GLUCOSE 351* 103* 104* 147*  BUN 30* 19 12 13   CREATININE 1.01* 0.74 0.71 0.81  CALCIUM 9.0 9.1 9.0 9.4  MG  --  1.2*  --   --    GFR: Estimated Creatinine Clearance: 65.6 mL/min (by C-G formula based on SCr of 0.81 mg/dL). Liver Function Tests: Recent Labs  Lab 06/18/18 0806  AST 15  ALT 15  ALKPHOS 66  BILITOT 0.3  PROT 6.7  ALBUMIN 3.5   No results for input(s): LIPASE, AMYLASE in the last 168 hours. No results for input(s): AMMONIA in the last 168 hours. Coagulation Profile: No results for input(s): INR, PROTIME in the last 168 hours. Cardiac Enzymes: No results for input(s): CKTOTAL, CKMB, CKMBINDEX, TROPONINI in the last 168 hours. BNP (last 3 results) No results for input(s): PROBNP in the last 8760 hours. HbA1C: No results for input(s): HGBA1C in the last 72 hours. CBG: Recent Labs  Lab 06/21/18 2345 06/22/18 0415 06/22/18 0735 06/22/18 1202 06/22/18 1632  GLUCAP 139* 122* 141* 226* 171*   Lipid Profile: No results for input(s): CHOL, HDL, LDLCALC, TRIG, CHOLHDL,  LDLDIRECT in the last 72 hours. Thyroid Function Tests: No results for input(s): TSH, T4TOTAL, FREET4, T3FREE, THYROIDAB in the last 72 hours. Anemia Panel: No results for input(s): VITAMINB12, FOLATE, FERRITIN, TIBC, IRON, RETICCTPCT in the last 72 hours. Urine analysis:    Component Value Date/Time   COLORURINE YELLOW 01/28/2018 0727   APPEARANCEUR CLEAR 01/28/2018 0727   LABSPEC 1.028 01/28/2018 0727   PHURINE 5.0 01/28/2018 0727   GLUCOSEU >=500 (A) 01/28/2018 0727   HGBUR SMALL (A) 01/28/2018 0727   BILIRUBINUR negative 04/05/2018 1047   KETONESUR negative 04/05/2018 1047   KETONESUR NEGATIVE 01/28/2018 0727   PROTEINUR NEGATIVE 01/28/2018 0727   UROBILINOGEN 0.2 04/05/2018 1047   UROBILINOGEN 0.2 01/03/2014 2001   NITRITE Negative 04/05/2018 1047   NITRITE NEGATIVE 01/28/2018 0727   LEUKOCYTESUR Small (1+) (A) 04/05/2018 1047   Sepsis Labs: @LABRCNTIP (procalcitonin:4,lacticidven:4)  ) Recent Results (from the past 240 hour(s))  MRSA PCR Screening     Status: None   Collection Time: 06/19/18  1:05 AM  Result Value Ref Range Status   MRSA by PCR  NEGATIVE NEGATIVE Final    Comment:        The GeneXpert MRSA Assay (FDA approved for NASAL specimens only), is one component of a comprehensive MRSA colonization surveillance program. It is not intended to diagnose MRSA infection nor to guide or monitor treatment for MRSA infections. Performed at [facility], [address], [place], [place] [address]       Studies: No results found.  Scheduled Meds: . apixaban  5 mg Oral BID  . atorvastatin  40 mg Oral Daily  . dicyclomine  20 mg Oral BID  . diltiazem  240 mg Oral Daily  . fenofibrate  160 mg Oral Daily  . ferrous sulfate  325 mg Oral BID WC  . insulin aspart  0-9 Units Subcutaneous Q4H  . insulin glargine  10 Units Subcutaneous QHS  . pantoprazole  40 mg Oral Daily    Continuous Infusions: . sodium chloride    . amiodarone 30 mg/hr (06/22/18 1632)      LOS: 4 days     Author:  Lynden Oxford, MD [facility] Pager: [phone] 06/22/2018 6:30 PM     If 7PM-7AM, please contact night-coverage [email or web address] Password [facility] 06/22/2018, 6:26 PM

## 2018-06-22 NOTE — Evaluation (Signed)
Physical Therapy Evaluation Patient Details Name: Emily Farmer MRN: [id number] DOB: 10/03/1955 Today's Date: 06/22/2018   History of Present Illness  Pt adm with anemia due to GI bleed and afib with rvr. PMH - chf, dm, htn, arthritis, gout  Clinical Impression  Pt doing well with mobility and no further PT needed.  Pt with afib with HR to 160-170 with activity.      Follow Up Recommendations No PT follow up    Equipment Recommendations  None recommended by PT    Recommendations for Other Services       Precautions / Restrictions Precautions Precautions: None      Mobility  Bed Mobility Overal bed mobility: Independent                Transfers Overall transfer level: Independent Equipment used: None                Ambulation/Gait Ambulation/Gait assistance: Independent Gait Distance (Feet): 125 Feet Assistive device: None Gait Pattern/deviations: WFL(Within Functional Limits)   Gait velocity interpretation: >2.62 ft/sec, indicative of community ambulatory General Gait Details: Steady gait. HR to 160-170's  Stairs            Wheelchair Mobility    Modified Rankin (Stroke Patients Only)       Balance Overall balance assessment: No apparent balance deficits (not formally assessed)                                           Pertinent Vitals/Pain Pain Assessment: No/denies pain    Home Living Family/patient expects to be discharged to:: Private residence Living Arrangements: Other relatives             Home Equipment: None      Prior Function Level of Independence: Independent         Comments: works at [employer]        Extremity/Trunk Assessment   Upper Extremity Assessment Upper Extremity Assessment: Overall WFL for tasks assessed    Lower Extremity Assessment Lower Extremity Assessment: Overall WFL for tasks assessed       Communication   Communication: No  difficulties  Cognition Arousal/Alertness: Awake/alert Behavior During Therapy: WFL for tasks assessed/performed Overall Cognitive Status: Within Functional Limits for tasks assessed                                        General Comments      Exercises     Assessment/Plan    PT Assessment Patent does not need any further PT services  PT Problem List         PT Treatment Interventions      PT Goals (Current goals can be found in the Care Plan section)  Acute Rehab PT Goals PT Goal Formulation: All assessment and education complete, DC therapy    Frequency     Barriers to discharge        Co-evaluation               AM-PAC PT "6 Clicks" Mobility  Outcome Measure Help needed turning from your back to your side while in a flat bed without using bedrails?: None Help needed moving from lying on your back to sitting on the side of a flat bed without  using bedrails?: None Help needed moving to and from a bed to a chair (including a wheelchair)?: None Help needed standing up from a chair using your arms (e.g., wheelchair or bedside chair)?: None Help needed to walk in hospital room?: None Help needed climbing 3-5 steps with a railing? : None 6 Click Score: 24    End of Session   Activity Tolerance: Treatment limited secondary to medical complications (Comment)(high HR) Patient left: in bed;with call bell/phone within reach Nurse Communication: Mobility status;Other (comment)(high HR) PT Visit Diagnosis: Other abnormalities of gait and mobility (R26.89)    Time: 1610-9604 PT Time Calculation (min) (ACUTE ONLY): 9 min   Charges:   PT Evaluation $PT Eval Low Complexity: 1 Low          [facility] PT Acute Rehabilitation Services Pager [phone] Office [phone]   Angelina Ok [facility] 06/22/2018, 9:34 AM

## 2018-06-22 NOTE — Progress Notes (Signed)
DAILY PROGRESS NOTE   Patient Name: Emily BoehringerRosemary Farmer Date of Encounter: 06/22/2018  Chief Complaint   No complaints  Patient Profile   63 yo female with PAF, admitted for severe anemia likely secondary to GI bleed.  Subjective   Noted to have afib with RVR overnight - had colonoscopy yesterday which was normal. Off aspirin - now back on Eliquis.   Objective   Vitals:   06/21/18 2300 06/22/18 0300 06/22/18 0735 06/22/18 0903  BP: (!) 91/51 115/63 (!) 125/54   Pulse: (!) 56 62 72 (!) 118  Resp: 14 13 19    Temp: 97.9 F (36.6 C) 97.8 F (36.6 C) 98 F (36.7 C)   TempSrc: Oral Oral Oral   SpO2: 95% 100% 99%   Weight:      Height:        Intake/Output Summary (Last 24 hours) at 06/22/2018 0915 Last data filed at 06/21/2018 1700 Gross per 24 hour  Intake 1100 ml  Output -  Net 1100 ml   Filed Weights   06/19/18 0100 06/21/18 1322  Weight: 66.7 kg 64 kg    Physical Exam   General appearance: alert and no distress Lungs: clear to auscultation bilaterally Heart: irregularly irregular rhythm Extremities: extremities normal, atraumatic, no cyanosis or edema Neurologic: Grossly normal  Inpatient Medications    Scheduled Meds: . apixaban  5 mg Oral BID  . atorvastatin  40 mg Oral Daily  . dicyclomine  20 mg Oral BID  . diltiazem  240 mg Oral Daily  . fenofibrate  160 mg Oral Daily  . ferrous sulfate  325 mg Oral BID WC  . insulin aspart  0-9 Units Subcutaneous Q4H  . insulin glargine  10 Units Subcutaneous QHS  . metoprolol tartrate  37.5 mg Oral BID  . pantoprazole  40 mg Oral Daily    Continuous Infusions: . sodium chloride      PRN Meds: acetaminophen, metoprolol tartrate   Labs   Results for orders placed or performed during the hospital encounter of 06/18/18 (from the past 48 hour(s))  Glucose, capillary     Status: Abnormal   Collection Time: 06/20/18 12:23 PM  Result Value Ref Range   Glucose-Capillary 137 (H) 70 - 99 mg/dL   Comment 1 Notify RN    Glucose, capillary     Status: Abnormal   Collection Time: 06/20/18  4:53 PM  Result Value Ref Range   Glucose-Capillary 126 (H) 70 - 99 mg/dL   Comment 1 Notify RN    Comment 2 Document in Chart   Glucose, capillary     Status: Abnormal   Collection Time: 06/20/18  8:34 PM  Result Value Ref Range   Glucose-Capillary 196 (H) 70 - 99 mg/dL  Glucose, capillary     Status: Abnormal   Collection Time: 06/20/18  9:17 PM  Result Value Ref Range   Glucose-Capillary 200 (H) 70 - 99 mg/dL  Glucose, capillary     Status: Abnormal   Collection Time: 06/21/18 12:08 AM  Result Value Ref Range   Glucose-Capillary 137 (H) 70 - 99 mg/dL  CBC     Status: Abnormal   Collection Time: 06/21/18  2:44 AM  Result Value Ref Range   WBC 8.3 4.0 - 10.5 K/uL   RBC 4.42 3.87 - 5.11 MIL/uL   Hemoglobin 10.2 (L) 12.0 - 15.0 g/dL   HCT 09.834.0 (L) 11.936.0 - 14.746.0 %   MCV 76.9 (L) 80.0 - 100.0 fL   MCH 23.1 (L)  26.0 - 34.0 pg   MCHC 30.0 30.0 - 36.0 g/dL   RDW 16.1 (H) 09.6 - 04.5 %   Platelets 350 150 - 400 K/uL   nRBC 0.0 0.0 - 0.2 %    Comment: Performed at [facility], [address] [place], [place] [address]  Basic metabolic panel     Status: Abnormal   Collection Time: 06/21/18  2:44 AM  Result Value Ref Range   Sodium 135 135 - 145 mmol/L   Potassium 4.0 3.5 - 5.1 mmol/L   Chloride 103 98 - 111 mmol/L   CO2 29 22 - 32 mmol/L   Glucose, Bld 147 (H) 70 - 99 mg/dL   BUN 13 8 - 23 mg/dL   Creatinine, Ser 1.91 0.44 - 1.00 mg/dL   Calcium 9.4 8.9 - 47.8 mg/dL   GFR calc non Af Amer >60 >60 mL/min   GFR calc Af Amer >60 >60 mL/min   Anion gap 3 (L) 5 - 15    Comment: Performed at [facility], [address] [place], [place] [address]  Glucose, capillary     Status: Abnormal   Collection Time: 06/21/18  4:26 AM  Result Value Ref Range   Glucose-Capillary 128 (H) 70 - 99 mg/dL  Glucose, capillary     Status: Abnormal   Collection Time: 06/21/18  7:31 AM  Result Value Ref Range    Glucose-Capillary 147 (H) 70 - 99 mg/dL  Glucose, capillary     Status: Abnormal   Collection Time: 06/21/18 11:31 AM  Result Value Ref Range   Glucose-Capillary 128 (H) 70 - 99 mg/dL  Glucose, capillary     Status: None   Collection Time: 06/21/18  3:44 PM  Result Value Ref Range   Glucose-Capillary 94 70 - 99 mg/dL  Glucose, capillary     Status: None   Collection Time: 06/21/18  5:35 PM  Result Value Ref Range   Glucose-Capillary 94 70 - 99 mg/dL   Comment 1 Notify RN    Comment 2 Document in Chart   Glucose, capillary     Status: Abnormal   Collection Time: 06/21/18  8:49 PM  Result Value Ref Range   Glucose-Capillary 191 (H) 70 - 99 mg/dL  Glucose, capillary     Status: Abnormal   Collection Time: 06/21/18 11:45 PM  Result Value Ref Range   Glucose-Capillary 139 (H) 70 - 99 mg/dL  Glucose, capillary     Status: Abnormal   Collection Time: 06/22/18  4:15 AM  Result Value Ref Range   Glucose-Capillary 122 (H) 70 - 99 mg/dL   Comment 1 Notify RN   Glucose, capillary     Status: Abnormal   Collection Time: 06/22/18  7:35 AM  Result Value Ref Range   Glucose-Capillary 141 (H) 70 - 99 mg/dL    ECG   N/A  Telemetry   Afib with RVR - Personally Reviewed  Radiology    No results found.  Cardiac Studies   LV EF: 55% -   60%  ------------------------------------------------------------------- Indications:      Atrial fibrillation - currently SR 427.31.  ------------------------------------------------------------------- History:   Risk factors:  Hypertension. Diabetes mellitus. Dyslipidemia.  ------------------------------------------------------------------- Study Conclusions  - Left ventricle: The cavity size was normal. Wall thickness was   normal. Systolic function was normal. The estimated ejection   fraction was in the range of 55% to 60%. Wall motion was normal;   there were no regional wall motion abnormalities.  The study is   not technically  sufficient to allow evaluation of LV diastolic   function. - Aortic valve: Trileaflet; mildly thickened, mildly calcified   leaflets. - Mitral valve: There was mild regurgitation. - Right atrium: The atrium was mildly dilated. - Pulmonary arteries: Systolic pressure was mildly increased. PA   peak pressure: 34 mm Hg (S). - Pericardium, extracardiac: A small pericardial effusion was   identified. There was no evidence of hemodynamic compromise.  Assessment   Active Problems:   Paroxysmal A-fib (HCC)   Symptomatic anemia   Pericardial effusion   Plan   1. Colonoscopy was normal yesterday - no obvious source of anemia. Aspirin stopped. Now back on Eliquis. Had afib with RVR overnight - remains in afib today. Spontaneously converted a few days ago. On diltiazem. Was given metoprolol this morning. Suspect she will need AAD therapy. Will start on IV amiodarone today - if she converts again (she was in and out of afib during my exam) and maintains sinus for 24 hours, then can convert to po and discharge home. She will likely need a work excuse since she works in a [employer] (exertional work) until follow-up.  Time Spent Directly with Patient:  I have spent a total of 25 minutes with the patient reviewing hospital notes, telemetry, EKGs, labs and examining the patient as well as establishing an assessment and plan that was discussed personally with the patient.  > 50% of time was spent in direct patient care.  Length of Stay:  LOS: 4 days   Chrystie Nose, MD, [facility], FACP  [place]  [facility]  Medical Director of the Advanced Lipid Disorders &  Cardiovascular Risk Reduction Clinic Diplomate of the American Board of Clinical Lipidology Attending Cardiologist  Direct Dial: [phone]  Fax: [phone]  Website:  [email or web address] Nycole Kawahara 06/22/2018, 9:15 AM

## 2018-06-23 ENCOUNTER — Encounter (HOSPITAL_COMMUNITY): Payer: Self-pay | Admitting: Gastroenterology

## 2018-06-23 LAB — CBC WITH DIFFERENTIAL/PLATELET
Abs Immature Granulocytes: 0.01 10*3/uL (ref 0.00–0.07)
BASOS PCT: 0 %
Basophils Absolute: 0 10*3/uL (ref 0.0–0.1)
EOS PCT: 4 %
Eosinophils Absolute: 0.3 10*3/uL (ref 0.0–0.5)
HCT: 29.1 % — ABNORMAL LOW (ref 36.0–46.0)
Hemoglobin: 8.5 g/dL — ABNORMAL LOW (ref 12.0–15.0)
Immature Granulocytes: 0 %
Lymphocytes Relative: 41 %
Lymphs Abs: 3 10*3/uL (ref 0.7–4.0)
MCH: 22.3 pg — ABNORMAL LOW (ref 26.0–34.0)
MCHC: 29.2 g/dL — AB (ref 30.0–36.0)
MCV: 76.2 fL — ABNORMAL LOW (ref 80.0–100.0)
Monocytes Absolute: 0.7 10*3/uL (ref 0.1–1.0)
Monocytes Relative: 10 %
Neutro Abs: 3.4 10*3/uL (ref 1.7–7.7)
Neutrophils Relative %: 45 %
Platelets: 267 10*3/uL (ref 150–400)
RBC: 3.82 MIL/uL — ABNORMAL LOW (ref 3.87–5.11)
RDW: 17.8 % — ABNORMAL HIGH (ref 11.5–15.5)
WBC: 7.4 10*3/uL (ref 4.0–10.5)
nRBC: 0 % (ref 0.0–0.2)

## 2018-06-23 LAB — GLUCOSE, CAPILLARY
GLUCOSE-CAPILLARY: 99 mg/dL (ref 70–99)
GLUCOSE-CAPILLARY: 221 mg/dL — AB (ref 70–99)
Glucose-Capillary: 126 mg/dL — ABNORMAL HIGH (ref 70–99)
Glucose-Capillary: 190 mg/dL — ABNORMAL HIGH (ref 70–99)
Glucose-Capillary: 182 mg/dL — ABNORMAL HIGH (ref 70–99)

## 2018-06-23 LAB — COMPREHENSIVE METABOLIC PANEL
ALT: 12 U/L (ref 0–44)
AST: 13 U/L — ABNORMAL LOW (ref 15–41)
Albumin: 3.5 g/dL (ref 3.5–5.0)
Alkaline Phosphatase: 50 U/L (ref 38–126)
Anion gap: 9 (ref 5–15)
BUN: 19 mg/dL (ref 8–23)
CO2: 25 mmol/L (ref 22–32)
Calcium: 9.3 mg/dL (ref 8.9–10.3)
Chloride: 102 mmol/L (ref 98–111)
Creatinine, Ser: 1.11 mg/dL — ABNORMAL HIGH (ref 0.44–1.00)
GFR calc Af Amer: >60 mL/min (ref >60)
GFR, EST NON AFRICAN AMERICAN: 54 mL/min — AB (ref >60)
Glucose, Bld: 90 mg/dL (ref 70–99)
Potassium: 3.7 mmol/L (ref 3.5–5.1)
Sodium: 136 mmol/L (ref 135–145)
Total Bilirubin: 0.3 mg/dL (ref 0.3–1.2)
Total Protein: 6.5 g/dL (ref 6.5–8.1)

## 2018-06-23 LAB — MAGNESIUM: MAGNESIUM: 1.3 mg/dL — AB (ref 1.7–2.4)

## 2018-06-23 MED ORDER — MAGNESIUM SULFATE 4 GM/100ML IV SOLN
4.0000 g | Freq: Once | INTRAVENOUS | Status: DC
  Filled 2018-06-23 (×2): qty 100

## 2018-06-23 MED ORDER — AMIODARONE HCL 200 MG PO TABS
200.0000 mg | ORAL_TABLET | Freq: Two times a day (BID) | ORAL | Status: DC
  Administered 2018-06-23 – 2018-06-24 (×3): 200 mg via ORAL
  Filled 2018-06-23 (×3): qty 1

## 2018-06-23 NOTE — Progress Notes (Signed)
DAILY PROGRESS NOTE   Patient Name: Emily Farmer Date of Encounter: 06/23/2018  Chief Complaint   No complaints  Patient Profile   63 yo female with PAF, admitted for severe anemia likely secondary to GI bleed.  Subjective   Predominantly sinus overnight- ambulated yesterday with good HR control.  Objective   Vitals:   06/23/18 0500 06/23/18 0600 06/23/18 0700 06/23/18 0738  BP:    (!) 93/48  Pulse:      Resp: 15 16 14 17   Temp:    98.1 F (36.7 C)  TempSrc:    Oral  SpO2:      Weight:      Height:        Intake/Output Summary (Last 24 hours) at 06/23/2018 0746 Last data filed at 06/23/2018 0700 Gross per 24 hour  Intake 1164.03 ml  Output -  Net 1164.03 ml   Filed Weights   06/19/18 0100 06/21/18 1322  Weight: 66.7 kg 64 kg    Physical Exam   General appearance: alert and no distress Lungs: clear to auscultation bilaterally Heart: irregularly irregular rhythm Extremities: extremities normal, atraumatic, no cyanosis or edema Neurologic: Grossly normal  Inpatient Medications    Scheduled Meds: . apixaban  5 mg Oral BID  . atorvastatin  40 mg Oral Daily  . dicyclomine  20 mg Oral BID  . diltiazem  240 mg Oral Daily  . fenofibrate  160 mg Oral Daily  . ferrous sulfate  325 mg Oral BID WC  . insulin aspart  0-9 Units Subcutaneous Q4H  . insulin glargine  10 Units Subcutaneous QHS  . pantoprazole  40 mg Oral Daily    Continuous Infusions: . sodium chloride    . amiodarone 30 mg/hr (06/22/18 2228)    PRN Meds: acetaminophen, metoprolol tartrate   Labs   Results for orders placed or performed during the hospital encounter of 06/18/18 (from the past 48 hour(s))  Glucose, capillary     Status: Abnormal   Collection Time: 06/21/18 11:31 AM  Result Value Ref Range   Glucose-Capillary 128 (H) 70 - 99 mg/dL  Glucose, capillary     Status: None   Collection Time: 06/21/18  3:44 PM  Result Value Ref Range   Glucose-Capillary 94 70 - 99 mg/dL    Glucose, capillary     Status: None   Collection Time: 06/21/18  5:35 PM  Result Value Ref Range   Glucose-Capillary 94 70 - 99 mg/dL   Comment 1 Notify RN    Comment 2 Document in Chart   Glucose, capillary     Status: Abnormal   Collection Time: 06/21/18  8:49 PM  Result Value Ref Range   Glucose-Capillary 191 (H) 70 - 99 mg/dL  Glucose, capillary     Status: Abnormal   Collection Time: 06/21/18 11:45 PM  Result Value Ref Range   Glucose-Capillary 139 (H) 70 - 99 mg/dL  Glucose, capillary     Status: Abnormal   Collection Time: 06/22/18  4:15 AM  Result Value Ref Range   Glucose-Capillary 122 (H) 70 - 99 mg/dL   Comment 1 Notify RN   Glucose, capillary     Status: Abnormal   Collection Time: 06/22/18  7:35 AM  Result Value Ref Range   Glucose-Capillary 141 (H) 70 - 99 mg/dL  Glucose, capillary     Status: Abnormal   Collection Time: 06/22/18 12:02 PM  Result Value Ref Range   Glucose-Capillary 226 (H) 70 - 99 mg/dL  Glucose,  capillary     Status: Abnormal   Collection Time: 06/22/18  4:32 PM  Result Value Ref Range   Glucose-Capillary 171 (H) 70 - 99 mg/dL  Glucose, capillary     Status: Abnormal   Collection Time: 06/22/18 10:00 PM  Result Value Ref Range   Glucose-Capillary 214 (H) 70 - 99 mg/dL  CBC with Differential/Platelet     Status: Abnormal   Collection Time: 06/23/18  2:41 AM  Result Value Ref Range   WBC 7.4 4.0 - 10.5 K/uL   RBC 3.82 (L) 3.87 - 5.11 MIL/uL   Hemoglobin 8.5 (L) 12.0 - 15.0 g/dL    Comment: Reticulocyte Hemoglobin testing may be clinically indicated, consider ordering this additional test [id number]    HCT 29.1 (L) 36.0 - 46.0 %   MCV 76.2 (L) 80.0 - 100.0 fL   MCH 22.3 (L) 26.0 - 34.0 pg   MCHC 29.2 (L) 30.0 - 36.0 g/dL   RDW 80.3 (H) 21.2 - 24.8 %   Platelets 267 150 - 400 K/uL   nRBC 0.0 0.0 - 0.2 %   Neutrophils Relative % 45 %   Neutro Abs 3.4 1.7 - 7.7 K/uL   Lymphocytes Relative 41 %   Lymphs Abs 3.0 0.7 - 4.0 K/uL    Monocytes Relative 10 %   Monocytes Absolute 0.7 0.1 - 1.0 K/uL   Eosinophils Relative 4 %   Eosinophils Absolute 0.3 0.0 - 0.5 K/uL   Basophils Relative 0 %   Basophils Absolute 0.0 0.0 - 0.1 K/uL   Immature Granulocytes 0 %   Abs Immature Granulocytes 0.01 0.00 - 0.07 K/uL    Comment: Performed at [facility], [address], [place], [place] [address]  Comprehensive metabolic panel     Status: Abnormal   Collection Time: 06/23/18  2:41 AM  Result Value Ref Range   Sodium 136 135 - 145 mmol/L   Potassium 3.7 3.5 - 5.1 mmol/L   Chloride 102 98 - 111 mmol/L   CO2 25 22 - 32 mmol/L   Glucose, Bld 90 70 - 99 mg/dL   BUN 19 8 - 23 mg/dL   Creatinine, Ser 7.04 (H) 0.44 - 1.00 mg/dL   Calcium 9.3 8.9 - 88.8 mg/dL   Total Protein 6.5 6.5 - 8.1 g/dL   Albumin 3.5 3.5 - 5.0 g/dL   AST 13 (L) 15 - 41 U/L   ALT 12 0 - 44 U/L   Alkaline Phosphatase 50 38 - 126 U/L   Total Bilirubin 0.3 0.3 - 1.2 mg/dL   GFR calc non Af Amer 54 (L) >60 mL/min   GFR calc Af Amer >60 >60 mL/min   Anion gap 9 5 - 15    Comment: Performed at [facility], [address] [place], [place] [address]  Magnesium     Status: Abnormal   Collection Time: 06/23/18  2:41 AM  Result Value Ref Range   Magnesium 1.3 (L) 1.7 - 2.4 mg/dL    Comment: Performed at [facility], [address], [place], [place] [address]  Glucose, capillary     Status: None   Collection Time: 06/23/18  4:14 AM  Result Value Ref Range   Glucose-Capillary 99 70 - 99 mg/dL  Glucose, capillary     Status: Abnormal   Collection Time: 06/23/18  7:39 AM  Result Value Ref Range   Glucose-Capillary 126 (H) 70 - 99 mg/dL   Comment 1 Notify RN  Comment 2 Document in Chart     ECG   N/A  Telemetry   Afib with RVR - Personally Reviewed  Radiology    No results found.  Cardiac Studies   LV EF: 55% -   60%  ------------------------------------------------------------------- Indications:      Atrial fibrillation  - currently SR 427.31.  ------------------------------------------------------------------- History:   Risk factors:  Hypertension. Diabetes mellitus. Dyslipidemia.  ------------------------------------------------------------------- Study Conclusions  - Left ventricle: The cavity size was normal. Wall thickness was   normal. Systolic function was normal. The estimated ejection   fraction was in the range of 55% to 60%. Wall motion was normal;   there were no regional wall motion abnormalities. The study is   not technically sufficient to allow evaluation of LV diastolic   function. - Aortic valve: Trileaflet; mildly thickened, mildly calcified   leaflets. - Mitral valve: There was mild regurgitation. - Right atrium: The atrium was mildly dilated. - Pulmonary arteries: Systolic pressure was mildly increased. PA   peak pressure: 34 mm Hg (S). - Pericardium, extracardiac: A small pericardial effusion was   identified. There was no evidence of hemodynamic compromise.  Assessment   Active Problems:   Paroxysmal A-fib (HCC)   Symptomatic anemia   Pericardial effusion   Plan   Convert to oral amiodarone today - 200 mg BID for 1 week, then 200 mg daily. We will arrange follow-up in [facility] in 7-10 days. She will need a work excuse until she returns to the office for follow-up per hospitalist service (thanks!). Ok to d/c home today.  Time Spent Directly with Patient:  I have spent a total of 15 minutes with the patient reviewing hospital notes, telemetry, EKGs, labs and examining the patient as well as establishing an assessment and plan that was discussed personally with the patient.  > 50% of time was spent in direct patient care.  Length of Stay:  LOS: 5 days   Chrystie NoseKenneth C. Lekendrick Alpern, MD, [facility]ACC, FACP  [place]  [facility]  Medical Director of the Advanced Lipid Disorders &  Cardiovascular Risk Reduction Clinic Diplomate of the American Board of Clinical  Lipidology Attending Cardiologist  Direct Dial: [phone]  Fax: [phone]  Website:  [email or web address]  Kolina Kube C Mareo Portilla 06/23/2018, 7:46 AM

## 2018-06-23 NOTE — Progress Notes (Signed)
PROGRESS NOTE  Emily BoehringerRosemary Courser ZOX:[id number] DOB: 08/30/1955 DOA: 06/18/2018 PCP: Cain SaupeFulp, Cammie, MD  HPI/Recap of past 24 hours: Emily Farmer is a 63 y.o. female with medical history significant for paroxysmal A. fib on Eliquis, chronic diastolic CHF, type 2 diabetes, hyperlipidemia who presented to [facility] via EMS with complaints of shortness of breath and lightheadedness of a few hours duration.  Admits to occasional dark stool but states that she takes iron supplement. First colonoscopy was a year ago, seen by [facility].    In the ED presented with A. fib RVR.  Was given a dose of Lopressor with improvement but recurrence.  Cardiology consulted. Low hemoglobin 7.2 with baseline hemoglobin of 10. Negative FOBT.  Lab studies remarkable for profound iron deficiency.  Transfused 2 unit PRBCs and consulted GI for endoscopy.  Iron supplement held until after endoscopies.  EGD done on 06/20/2018 revealed chronic gastritis biopsied to rule out H. Pylori, a few non-bleeding small cratered and superficial duodenal ulcers with no stigmata of bleeding found in the duodenal bulb.  Colonoscopy planned on 06/21/2018.  06/23/2018: No acute complaint no nausea no vomiting.  Brown BM no blood in the stool.  States that she gets short of breath when her heart rate goes faster.  Assessment/Plan: Active Problems:   Paroxysmal A-fib (HCC)   Symptomatic anemia   Pericardial effusion  Symptomatic anemia/severe iron deficiency anemia possibly secondary to chronic gastritis, nonbleeding duodenal ulcers Presented with hemoglobin of 7.2 with baseline hemoglobin of 10.2 in 02/22/2018 FOBT negative Profound iron deficiency from iron studies Restart ferrous sulfate 325 milligrams twice daily Hg stable post 2 unit PRBC transfusion EGD done 06/20/2018 which revealed chronic gastritis which was biopsied to rule out H. pylori.  Also few scattered nonbleeding duodenal ulcers. GI recommended Protonix p.o. 40 mg daily. Stop aspirin  indefinitely as recommended by cardiology Colonoscopy negative for any active bleeding. Outpatient follow-up with GI recommended. 06/23/18: -Hemoglobin today 8.5. -Patient a little concerned. She wants to stay overnight to make sure Hb stays stable. -No further work-up per GI unless her hemoglobin drops further. -Patient says she has an appointment with her own gastroenterologist outpatient.  symptomatic chronic A. fib with RVR Presented with dyspnea with minimal movement, resolving Initially on Cardizem drip Continue diltiazem CD 240 mg daily as recommended by cardiology Started on IV amiodarone infusion by cardiology appreciate their assistance. Eliquis Resumed. 06/23/18: -Seen by cardiology.  Recommended amiodarone 200 mg p.o. BID for 1 week, then 200 mg daily.  Follow-up in cardiology clinic outpatient.  Resolved hypokalemia post repletion  Chronic diastolic CHF 2D echo done on 06/19/2018 revealed preserved LVEF 55 to 60% with wall motion normal Continue strict I's and O's and daily weight Continue cardiac medications  Type 2 diabetes Last A1c done in November 2019 revealed A1c of 9.8 Continue insulin sliding scale Continue long-acting insulin at half dose Resume diet as recommended by GI  Hyperlipidemia LDL 105 on 03/22/2018 Resume Lipitor and fenofibrate  Resolved anion gap metabolic acidosis  Resolved pseudohyponatremia suspect secondary to hyperglycemia  DVT prophylaxis:  Eliquis  Code Status: Full code  Family Communication: None at bedside  Disposition Plan:  Home tomorrow provided Hb remains stable. Consults called: Cardiology and GI  Objective: Vitals:   06/23/18 0500 06/23/18 0600 06/23/18 0700 06/23/18 0738  BP:    (!) 93/48  Pulse:      Resp: 15 16 14 17   Temp:    98.1 F (36.7 C)  TempSrc:    Oral  SpO2:      Weight:      Height:        Intake/Output Summary (Last 24 hours) at 06/23/2018 0937 Last data filed at 06/23/2018 0700 Gross per 24  hour  Intake 1164.03 ml  Output -  Net 1164.03 ml   Filed Weights   06/19/18 0100 06/21/18 1322  Weight: 66.7 kg 64 kg    Exam: General: Alert and oriented x3, does not appear to be in any acute distress at this time. Cardiovascular: S1 and S2 Present, aortic systolic Murmur, Respiratory: normal respiratory effort, Bilateral Air entry present and Clear to Auscultation, no Crackles, no wheezes Abdomen: Bowel Sound present, Soft and no tenderness, no hernia Extremities: no Pedal edema, no calf tenderness Neurology: Alert, Awake and Oriented x3. affect appropriate. Grossly no focal neuro deficit.    Data Reviewed: CBC: Recent Labs  Lab 06/18/18 0806 06/19/18 0235 06/20/18 0224 06/21/18 0244 06/23/18 0241  WBC 9.1 8.6 6.3 8.3 7.4  NEUTROABS 6.2  --   --   --  3.4  HGB 7.2* 8.7* 9.1* 10.2* 8.5*  HCT 25.7* 29.5* 29.7* 34.0* 29.1*  MCV 74.1* 75.4* 75.8* 76.9* 76.2*  PLT 300 264 289 350 267   Basic Metabolic Panel: Recent Labs  Lab 06/18/18 0806 06/19/18 0235 06/20/18 0224 06/21/18 0244 06/23/18 0241  NA 133* 137 138 135 136  K 4.0 3.1* 4.3 4.0 3.7  CL 101 104 106 103 102  CO2 19* 21* 24 29 25   GLUCOSE 351* 103* 104* 147* 90  BUN 30* 19 12 13 19   CREATININE 1.01* 0.74 0.71 0.81 1.11*  CALCIUM 9.0 9.1 9.0 9.4 9.3  MG  --  1.2*  --   --  1.3*   GFR: Estimated Creatinine Clearance: 47.9 mL/min (A) (by C-G formula based on SCr of 1.11 mg/dL (H)). Liver Function Tests: Recent Labs  Lab 06/18/18 0806 06/23/18 0241  AST 15 13*  ALT 15 12  ALKPHOS 66 50  BILITOT 0.3 0.3  PROT 6.7 6.5  ALBUMIN 3.5 3.5   No results for input(s): LIPASE, AMYLASE in the last 168 hours. No results for input(s): AMMONIA in the last 168 hours. Coagulation Profile: No results for input(s): INR, PROTIME in the last 168 hours. Cardiac Enzymes: No results for input(s): CKTOTAL, CKMB, CKMBINDEX, TROPONINI in the last 168 hours. BNP (last 3 results) No results for input(s): PROBNP in the  last 8760 hours. HbA1C: No results for input(s): HGBA1C in the last 72 hours. CBG: Recent Labs  Lab 06/22/18 1202 06/22/18 1632 06/22/18 2200 06/23/18 0414 06/23/18 0739  GLUCAP 226* 171* 214* 99 126*   Lipid Profile: No results for input(s): CHOL, HDL, LDLCALC, TRIG, CHOLHDL, LDLDIRECT in the last 72 hours. Thyroid Function Tests: No results for input(s): TSH, T4TOTAL, FREET4, T3FREE, THYROIDAB in the last 72 hours. Anemia Panel: No results for input(s): VITAMINB12, FOLATE, FERRITIN, TIBC, IRON, RETICCTPCT in the last 72 hours. Urine analysis:    Component Value Date/Time   COLORURINE YELLOW 01/28/2018 0727   APPEARANCEUR CLEAR 01/28/2018 0727   LABSPEC 1.028 01/28/2018 0727   PHURINE 5.0 01/28/2018 0727   GLUCOSEU >=500 (A) 01/28/2018 0727   HGBUR SMALL (A) 01/28/2018 0727   BILIRUBINUR negative 04/05/2018 1047   KETONESUR negative 04/05/2018 1047   KETONESUR NEGATIVE 01/28/2018 0727   PROTEINUR NEGATIVE 01/28/2018 0727   UROBILINOGEN 0.2 04/05/2018 1047   UROBILINOGEN 0.2 01/03/2014 2001   NITRITE Negative 04/05/2018 1047   NITRITE NEGATIVE 01/28/2018 0727   LEUKOCYTESUR  Small (1+) (A) 04/05/2018 1047   Sepsis Labs: @LABRCNTIP (procalcitonin:4,lacticidven:4)  ) Recent Results (from the past 240 hour(s))  MRSA PCR Screening     Status: None   Collection Time: 06/19/18  1:05 AM  Result Value Ref Range Status   MRSA by PCR NEGATIVE NEGATIVE Final    Comment:        The GeneXpert MRSA Assay (FDA approved for NASAL specimens only), is one component of a comprehensive MRSA colonization surveillance program. It is not intended to diagnose MRSA infection nor to guide or monitor treatment for MRSA infections. Performed at [facility], [address] [place], [place] [address]       Studies: No results found.  Scheduled Meds: . amiodarone  200 mg Oral BID  . apixaban  5 mg Oral BID  . atorvastatin  40 mg Oral Daily  . dicyclomine  20 mg Oral BID    . diltiazem  240 mg Oral Daily  . fenofibrate  160 mg Oral Daily  . ferrous sulfate  325 mg Oral BID WC  . insulin aspart  0-9 Units Subcutaneous Q4H  . insulin glargine  10 Units Subcutaneous QHS  . pantoprazole  40 mg Oral Daily    Continuous Infusions: . sodium chloride       LOS: 5 days     Author:  Vonzella NippleAnupama Ethell Blatchford, MD [facility] Pager: on amion 06/23/2018 9:37 AM     If 7PM-7AM, please contact night-coverage [email or web address] Password [email or web address] 06/23/2018, 9:37 AM

## 2018-06-23 NOTE — Progress Notes (Signed)
F/u scheduled 07/01/18 with Azalee Course PA-C, appt placed on AVS.

## 2018-06-24 DIAGNOSIS — D500 Iron deficiency anemia secondary to blood loss (chronic): Principal | ICD-10-CM

## 2018-06-24 DIAGNOSIS — E669 Obesity, unspecified: Secondary | ICD-10-CM

## 2018-06-24 DIAGNOSIS — E1169 Type 2 diabetes mellitus with other specified complication: Secondary | ICD-10-CM

## 2018-06-24 LAB — GLUCOSE, CAPILLARY
Glucose-Capillary: 120 mg/dL — ABNORMAL HIGH (ref 70–99)
Glucose-Capillary: 149 mg/dL — ABNORMAL HIGH (ref 70–99)
Glucose-Capillary: 191 mg/dL — ABNORMAL HIGH (ref 70–99)
Glucose-Capillary: 249 mg/dL — ABNORMAL HIGH (ref 70–99)

## 2018-06-24 LAB — CBC
HCT: 28.5 % — ABNORMAL LOW (ref 36.0–46.0)
Hemoglobin: 8.5 g/dL — ABNORMAL LOW (ref 12.0–15.0)
MCH: 22.6 pg — ABNORMAL LOW (ref 26.0–34.0)
MCHC: 29.8 g/dL — ABNORMAL LOW (ref 30.0–36.0)
MCV: 75.8 fL — AB (ref 80.0–100.0)
NRBC: 0 % (ref 0.0–0.2)
Platelets: 265 10*3/uL (ref 150–400)
RBC: 3.76 MIL/uL — ABNORMAL LOW (ref 3.87–5.11)
RDW: 18.1 % — ABNORMAL HIGH (ref 11.5–15.5)
WBC: 6.6 10*3/uL (ref 4.0–10.5)

## 2018-06-24 LAB — MAGNESIUM: Magnesium: 1.8 mg/dL (ref 1.7–2.4)

## 2018-06-24 MED ORDER — FERROUS SULFATE 325 (65 FE) MG PO TABS: 325.0000 mg | ORAL_TABLET | Freq: Two times a day (BID) | ORAL | 0 refills | Status: DC

## 2018-06-24 MED ORDER — AMIODARONE HCL 200 MG PO TABS: ORAL_TABLET | ORAL | 0 refills | Status: DC

## 2018-06-24 MED ORDER — MUPIROCIN 2 % EX OINT: 1.0000 "application " | TOPICAL_OINTMENT | Freq: Two times a day (BID) | CUTANEOUS | Status: DC | PRN

## 2018-06-24 MED ORDER — PANTOPRAZOLE SODIUM 40 MG PO TBEC: 40.0000 mg | DELAYED_RELEASE_TABLET | Freq: Every day | ORAL | 0 refills | Status: DC

## 2018-06-24 MED ORDER — DILTIAZEM HCL ER COATED BEADS 240 MG PO CP24: 240.0000 mg | ORAL_CAPSULE | Freq: Every day | ORAL | 0 refills | Status: DC

## 2018-06-24 MED FILL — FERROUS SULFATE 325 MG TAB: 325 (65 FE) | 30 days supply | Qty: 60 | Fill #0

## 2018-06-24 MED FILL — PANTOPRAZOLE SOD DR 40 MG T: 40 | 30 days supply | Qty: 30 | Fill #0

## 2018-06-24 MED FILL — AMIODARONE HCL 200 MG TAB: 200 | 53 days supply | Qty: 60 | Fill #0

## 2018-06-24 MED FILL — DILTIAZEM 24HR CD 240 MG CA: 240 | 30 days supply | Qty: 30 | Fill #0

## 2018-06-24 NOTE — Plan of Care (Signed)
  Problem: Education: Goal: Knowledge of disease or condition will improve Outcome: Progressing Goal: Understanding of medication regimen will improve Outcome: Progressing Goal: Individualized Educational Video(s) Outcome: Progressing   Problem: Cardiac: Goal: Ability to achieve and maintain adequate cardiopulmonary perfusion will improve Outcome: Progressing   Problem: Health Behavior/Discharge Planning: Goal: Ability to safely manage health-related needs after discharge will improve Outcome: Progressing   Problem: Health Behavior/Discharge Planning: Goal: Ability to manage health-related needs will improve Outcome: Progressing   Problem: Clinical Measurements: Goal: Ability to maintain clinical measurements within normal limits will improve Outcome: Progressing Goal: Will remain free from infection Outcome: Progressing Goal: Diagnostic test results will improve Outcome: Progressing Goal: Cardiovascular complication will be avoided Outcome: Progressing   Problem: Nutrition: Goal: Adequate nutrition will be maintained Outcome: Progressing   Problem: Coping: Goal: Level of anxiety will decrease Outcome: Progressing   Problem: Safety: Goal: Ability to remain free from injury will improve Outcome: Progressing   Problem: Skin Integrity: Goal: Risk for impaired skin integrity will decrease Outcome: Progressing

## 2018-06-24 NOTE — Discharge Summary (Signed)
Physician Discharge Summary  Emily Farmer ZOX:[id number] DOB: Oct 26, 1955  PCP: Antony Blackbird, MD  Admit date: 06/18/2018 Discharge date: 06/24/2018  Recommendations for Outpatient Follow-up:  1. Dr. Antony Blackbird, PCP in 1 week with repeat labs (CBC & BMP). Bunker, PA/Cardiology on 07/01/2018 at 11:30 AM. 3. Dr. Ronald Lobo, [facility] in 4 weeks.  Home Health: None Equipment/Devices: None  Discharge Condition: Improved and stable CODE STATUS: Full Diet recommendation: Heart healthy & diabetic diet.  Discharge Diagnoses:  Active Problems:   Paroxysmal A-fib (HCC)   Symptomatic anemia   Pericardial effusion   Brief Summary: Emily Farmer a 63 y.o.femalewith medical history significant forparoxysmal A. fib on Eliquis and aspirin 81 mg daily PTA, chronic diastolic CHF, type 2 diabetes/IDDM, hyperlipidemia who presented to [facility] ED via EMS with complaints of shortness of breath and lightheadedness of a few hours duration. Admits to occasional dark stool but states that she takes iron supplement. First colonoscopy was a year ago,seen by [facility].    In the ED presented with A. fib RVR. Was given a dose of Lopressor with improvement but recurrence.  Cardiology consulted.Lowhemoglobin 7.2 with baseline hemoglobin of 10. Negative FOBT. Lab studies remarkable for profound iron deficiency.  Transfused 2 unit PRBCs and consulted GI for endoscopy.  Iron supplement held until after endoscopies.  EGD done on 06/20/2018 revealed chronic gastritis biopsied to rule out H. Pylori, a few non-bleeding small cratered and superficial duodenal ulcers with no stigmata of bleeding found in the duodenal bulb.    Underwent colonoscopy 06/21/2018 which did not reveal source of iron deficiency anemia.   Assessment/Plan:  Symptomatic anemia/severe iron deficiency anemia possibly secondary to chronic gastritis, nonbleeding duodenal ulcers Presented with hemoglobin of 7.2 with baseline hemoglobin of 10.2  in 02/22/2018 FOBT negative Profound iron deficiency from iron studies: Iron 13, TIBC 522, saturation ratio 2, ferritin 4, folate 42.4 and B12: 610. Restarted ferrous sulfate 325 milligrams twice daily Hg stable post 2 unit PRBC transfusion EGD done 06/20/2018 which revealed chronic gastritis which was biopsied and pathology negative for H. pylori.  Also few scattered nonbleeding duodenal ulcers. GI recommended Protonix p.o. 40 mg daily.  Duration of PPI to be determined by GI during outpatient follow-up.  Aspirin now discontinued indefinitely as per cardiology recommendations.  Patient however remains on Eliquis. Colonoscopy negative for any active bleeding. Outpatient follow-up with GI recommended in 4 weeks. Follow CBCs closely as outpatient and repeat iron studies in 4 to 6 weeks.  Chronic A. fib with RVR Presented with dyspnea with minimal movement Initially on Cardizem drip and then transitioned to oral Cardizem.  Home metoprolol discontinued. Continue diltiazem CD 240 mg daily as recommended by cardiology Started on IV amiodarone infusion by cardiology which as per the recommendation has been changed to 200 mg twice daily for 1 week followed by 200 mg once daily thereafter. Eliquis Resumed.  Aspirin 81 mg discontinued indefinitely. Currently in sinus rhythm.  Outpatient follow-up with cardiology has been arranged. As per cardiology, patient will need a work excuse letter until she returns to their office for follow-up.  Hypokalemia/hypomagnesemia Replaced.  Chronic diastolic CHF 2D echo done on 06/19/2018 revealed preserved LVEF 55 to 60% with wall motion normal Clinically euvolemic.  Not on diuretics.  Type 2 diabetes Last A1c done in November 2019 revealed A1c of 9.8 indicating poor glycemic control. In the hospital she was placed on reduced dose of Lantus and SSI.  Resume prior home dose of Lantus and metformin at discharge.  Recommend close outpatient follow-up, adjustment of  medications for better DM control.  Hyperlipidemia LDL 105 on 03/22/2018 Continue Lipitor and fenofibrate    Consultations:  Cardiology  [facility].  Procedures:  EGD 06/20/2018: Impression: - ?tiny hiatal hernia. - Chronic gastritis. Biopsied. To rule out H. pylori - Few small non-bleeding duodenal ulcers with no stigmata of bleeding. - Normal second portion of the duodenum. - The examination was otherwise normal.   Colonoscopy 06/21/2018: Impression: - The entire examined colon is normal. No source of iron deficiency anemia seen. - The examined portion of the ileum was normal. - The distal rectum and anal verge are normal on retroflexion view. - No specimens collected.   Pathology: Diagnosis Stomach, biopsy - REACTIVE GASTROPATHY - NO H. PYLORI OR INTESTINAL METAPLASIA IDENTIFIED - SEE COMMENT   TTE 06/19/2018 Study Conclusions  - Left ventricle: The cavity size was normal. Wall thickness was   normal. Systolic function was normal. The estimated ejection   fraction was in the range of 55% to 60%. Wall motion was normal;   there were no regional wall motion abnormalities. The study is   not technically sufficient to allow evaluation of LV diastolic   function. - Aortic valve: Trileaflet; mildly thickened, mildly calcified   leaflets. - Mitral valve: There was mild regurgitation. - Right atrium: The atrium was mildly dilated. - Pulmonary arteries: Systolic pressure was mildly increased. PA   peak pressure: 34 mm Hg (S). - Pericardium, extracardiac: A small pericardial effusion was   identified. There was no evidence of hemodynamic compromise.  Discharge Instructions  Discharge Instructions    (HEART FAILURE PATIENTS) Call MD:  Anytime you have any of the following symptoms: 1) 3 pound weight gain in 24 hours or 5 pounds in 1 week 2) shortness of breath, with or without a dry hacking cough 3) swelling in the hands, feet or stomach 4) if you have to sleep on  extra pillows at night in order to breathe.   Complete by:  As directed    Call MD for:   Complete by:  As directed    Recurrent black tarry stools or blood in stools.  Vomiting blood or coffee-ground colored material.   Call MD for:  difficulty breathing, headache or visual disturbances   Complete by:  As directed    Call MD for:  extreme fatigue   Complete by:  As directed    Call MD for:  persistant dizziness or light-headedness   Complete by:  As directed    Call MD for:  persistant nausea and vomiting   Complete by:  As directed    Call MD for:  severe uncontrolled pain   Complete by:  As directed    Diet - low sodium heart healthy   Complete by:  As directed    Diet Carb Modified   Complete by:  As directed    Increase activity slowly   Complete by:  As directed        Medication List    STOP taking these medications   aspirin EC 81 MG tablet   azithromycin 250 MG tablet Commonly known as:  ZITHROMAX   famotidine 20 MG tablet Commonly known as:  PEPCID   metoprolol tartrate 25 MG tablet Commonly known as:  LOPRESSOR   ondansetron 4 MG disintegrating tablet Commonly known as:  ZOFRAN ODT   promethazine 25 MG suppository Commonly known as:  PHENERGAN   sulfamethoxazole-trimethoprim 800-160 MG tablet Commonly known as:  BACTRIM  DS     TAKE these medications   acetaminophen 325 MG tablet Commonly known as:  TYLENOL Take 2 tablets (650 mg total) by mouth every 6 (six) hours as needed for mild pain (or Fever >/= 101).   amiodarone 200 MG tablet Commonly known as:  PACERONE Take 1 tab (200 mg) by mouth 2 times daily for 1 week then reduce to 1 tab (200 mg) 1 time daily thereafter.   apixaban 5 MG Tabs tablet Commonly known as:  ELIQUIS Take 1 tablet (5 mg total) by mouth 2 (two) times daily.   atorvastatin 40 MG tablet Commonly known as:  LIPITOR Take 1 tablet (40 mg total) by mouth daily.   CENTRUM SILVER PO Take 1 tablet by mouth daily.    cetirizine 10 MG tablet Commonly known as:  ZYRTEC Take 1 tablet (10 mg total) by mouth daily.   dicyclomine 20 MG tablet Commonly known as:  BENTYL Take 1 tablet (20 mg total) by mouth 2 (two) times daily.   diltiazem 240 MG 24 hr capsule Commonly known as:  CARDIZEM CD Take 1 capsule (240 mg total) by mouth daily. Start taking on:  June 25, 2018   fenofibrate 160 MG tablet Take 1 tablet (160 mg total) by mouth daily.   ferrous sulfate 325 (65 FE) MG tablet Take 1 tablet (325 mg total) by mouth 2 (two) times daily with a meal.   glucose blood test strip Commonly known as:  TRUE METRIX BLOOD GLUCOSE TEST Use as instructed to check blood sugars 3 times per day   hydroxypropyl methylcellulose / hypromellose 2.5 % ophthalmic solution Commonly known as:  ISOPTO TEARS / GONIOVISC Place 1 drop into both eyes as needed for dry eyes.   Insulin Glargine 100 UNIT/ML Solostar Pen Commonly known as:  LANTUS SOLOSTAR Inject 25 units daily. If sugars are > 130 after 3 days, increase to 28 units. If they remain elevated, increase to 30 units.   Insulin Pen Needle 32G X 4 MM Misc Commonly known as:  TRUEPLUS PEN NEEDLES Use with insulin pen to inject Lantus daily.   Insulin Syringe-Needle U-100 31G X 5/16" 0.3 ML Misc Commonly known as:  TRUEPLUS INSULIN SYRINGE Use to inject insulin daily.   metFORMIN 500 MG tablet Commonly known as:  GLUCOPHAGE Take 2 tablets (1,000 mg total) by mouth 2 (two) times daily with a meal.   mupirocin ointment 2 % Commonly known as:  BACTROBAN Place 1 application into the nose 2 (two) times daily as needed.   omega-3 acid ethyl esters 1 g capsule Commonly known as:  LOVAZA Take 2 capsules (2 g total) by mouth 2 (two) times daily.   pantoprazole 40 MG tablet Commonly known as:  PROTONIX Take 1 tablet (40 mg total) by mouth daily.   TRUE METRIX METER w/Device Kit Use to monitor blood sugar as directed   TRUEPLUS LANCETS 28G Misc Use to check  blood sugar 3 times per day      Follow-up Information    Almyra Deforest, PA Follow up.   Specialties:  Cardiology, Radiology Why:  [facility] location in [facility] - 07/01/18 at 11:30am as listed below. Isaac Laud is one of the PAs that works closely with Dr. Debara Pickett and his care team. Please arrive 15 minutes prior to appointment to check in. Contact information: [address] [place] [address] [phone]        Antony Blackbird, MD. Schedule an appointment as soon as  possible for a visit in 1 week(s).   Specialty:  Family Medicine Why:  To be seen with repeat labs (CBC & BMP). Contact information: [place] [address] [phone]        Ronald Lobo, MD. Schedule an appointment as soon as possible for a visit in 4 week(s).   Specialty:  Gastroenterology Contact information: [address] [place] [address] [phone]          No Known Allergies    Procedures/Studies:  Dg Chest Portable 1 View  Result Date: 06/18/2018 CLINICAL DATA:  Shortness of breath. Atrial fibrillation. EXAM: PORTABLE CHEST 1 VIEW COMPARISON:  06/04/2018 FINDINGS: The heart size and pulmonary vascularity are normal. No infiltrates or effusions. Chronic blunting of the right costophrenic angle laterally. Surgical clips in the right lung. Bones are normal. IMPRESSION: No acute cardiopulmonary abnormality. Electronically Signed   By: Lorriane Shire M.D.   On: 06/18/2018 08:44      Subjective: Patient denies complaints.  "I am fine".  Had normal BM yesterday.  No nausea or vomiting.  Tolerating diet.  Ambulating comfortably without dizziness, lightheadedness, palpitations, chest pain or dyspnea.  As per RN, no acute issues noted.  Patient also indicated that she lives with her sister, is independent and works as a Pension scheme manager.  Discharge Exam:  Vitals:   06/23/18 2300 06/24/18 0300 06/24/18 0500 06/24/18 0818  BP:  113/64  110/61 138/90  Pulse: (!) 59  (!) 59 64  Resp: (!) _0 Temp: 98.1 F (36.7 C)  97.7 F (36.5 C) 97.7 F (36.5 C)  TempSrc: Oral  Oral Oral  SpO2: 99%  98% 100%  Weight:      Height:        General: Pt lying comfortably in bed & appears in no obvious distress. Cardiovascular: S1 & S2 heard, RRR, S1/S2 +. No murmurs, rubs, gallops or clicks. No JVD or pedal edema.  Telemetry personally reviewed: SB in the 50s-SR.  A 2.09-second pause noted. Respiratory: Clear to auscultation without wheezing, rhonchi or crackles. No increased work of breathing. Abdominal:  Non distended, non tender & soft. No organomegaly or masses appreciated. Normal bowel sounds heard. CNS: Alert and oriented. No focal deficits. Extremities: no edema, no cyanosis    The results of significant diagnostics from this hospitalization (including imaging, microbiology, ancillary and laboratory) are listed below for reference.     Microbiology: Recent Results (from the past 240 hour(s))  MRSA PCR Screening     Status: None   Collection Time: 06/19/18  1:05 AM  Result Value Ref Range Status   MRSA by PCR NEGATIVE NEGATIVE Final    Comment:        The GeneXpert MRSA Assay (FDA approved for NASAL specimens only), is one component of a comprehensive MRSA colonization surveillance program. It is not intended to diagnose MRSA infection nor to guide or monitor treatment for MRSA infections. Performed at [facility], [place] [address], [place], [place] [address]      Labs: CBC: Recent Labs  Lab 06/18/18 [id number] 06/19/18 0235 06/20/18 0224 06/21/18 0244 06/23/18 0241 06/24/18 0740  WBC 9.1 8.6 6.3 8.3 7.4 6.6  NEUTROABS 6.2  --   --   --  3.4  --   HGB 7.2* 8.7* 9.1* 10.2* 8.5* 8.5*  HCT 25.7* 29.5* 29.7* 34.0* 29.1* 28.5*  MCV 74.1* 75.4* 75.8* 76.9* 76.2* 75.8*  PLT 300 264 289 350 267 265  Basic Metabolic Panel: Recent Labs  Lab 06/18/18 0806 06/19/18 0235 06/20/18 0224  06/21/18 0244 06/23/18 0241 06/24/18 0226  NA 133* 137 138 135 136  --   K 4.0 3.1* 4.3 4.0 3.7  --   CL 101 104 106 103 102  --   CO2 19* 21* _0 --   GLUCOSE 351* 103* 104* 147* 90  --   BUN 30* _1 --   CREATININE 1.01* 0.74 0.71 0.81 1.11*  --   CALCIUM 9.0 9.1 9.0 9.4 9.3  --   MG  --  1.2*  --   --  1.3* 1.8   Liver Function Tests: Recent Labs  Lab 06/18/18 0806 06/23/18 0241  AST 15 13*  ALT 15 12  ALKPHOS 66 50  BILITOT 0.3 0.3  PROT 6.7 6.5  ALBUMIN 3.5 3.5    CBG: Recent Labs  Lab 06/23/18 2037 06/24/18 0023 06/24/18 0525 06/24/18 0814 06/24/18 1150  GLUCAP 221* 191* 149* 120* 249*      Time coordinating discharge: 40 minutes  SIGNED:  Vernell Leep, MD, FACP, [facility]. [facility] Pager [phone]  If 7PM-7AM, please contact night-coverage [email or web address] Password [facility] 06/24/2018, 12:27 PM

## 2018-06-24 NOTE — Progress Notes (Signed)
Patient is ready for discharge. She is alert and oriented and vital signs are stable. All discharge information has been reviewed and questions have been answered. Letter for patient's employer has been printed and given to patient. Patient has all of her belongings. IV has been removed without complication and she has been removed from telemetry, CCMD notified. Patient will be transported home by [facility] [employer]. Vouchers have been provided to patient by hospital. There is a voucher for transportation to [facility] to get her prescriptions and another voucher to take her to her place of residence. She will leave unit by wheelchair and meet [facility] at front entrance of the hospital.

## 2018-06-25 ENCOUNTER — Encounter (HOSPITAL_COMMUNITY): Payer: Self-pay | Admitting: Emergency Medicine

## 2018-06-25 ENCOUNTER — Observation Stay (HOSPITAL_COMMUNITY)
Admission: EM | Admit: 2018-06-25 | Discharge: 2018-06-26 | Disposition: A | Discharge status: Home / Self Care | Payer: Self-pay | Attending: Cardiovascular Disease | Admitting: Cardiovascular Disease

## 2018-06-25 ENCOUNTER — Emergency Department (HOSPITAL_COMMUNITY): Payer: Medicaid Other

## 2018-06-25 ENCOUNTER — Observation Stay (HOSPITAL_COMMUNITY): Payer: Medicaid Other

## 2018-06-25 DIAGNOSIS — R55 Syncope and collapse: Secondary | ICD-10-CM

## 2018-06-25 DIAGNOSIS — R739 Hyperglycemia, unspecified: Secondary | ICD-10-CM

## 2018-06-25 DIAGNOSIS — E860 Dehydration: Secondary | ICD-10-CM

## 2018-06-25 DIAGNOSIS — I4891 Unspecified atrial fibrillation: Principal | ICD-10-CM | POA: Insufficient documentation

## 2018-06-25 DIAGNOSIS — R001 Bradycardia, unspecified: Secondary | ICD-10-CM

## 2018-06-25 DIAGNOSIS — I5032 Chronic diastolic (congestive) heart failure: Secondary | ICD-10-CM | POA: Insufficient documentation

## 2018-06-25 DIAGNOSIS — Z7901 Long term (current) use of anticoagulants: Secondary | ICD-10-CM | POA: Insufficient documentation

## 2018-06-25 DIAGNOSIS — E119 Type 2 diabetes mellitus without complications: Secondary | ICD-10-CM | POA: Insufficient documentation

## 2018-06-25 DIAGNOSIS — N179 Acute kidney failure, unspecified: Secondary | ICD-10-CM

## 2018-06-25 DIAGNOSIS — I110 Hypertensive heart disease with heart failure: Secondary | ICD-10-CM | POA: Insufficient documentation

## 2018-06-25 DIAGNOSIS — I10 Essential (primary) hypertension: Secondary | ICD-10-CM | POA: Diagnosis present

## 2018-06-25 DIAGNOSIS — Z79899 Other long term (current) drug therapy: Secondary | ICD-10-CM | POA: Insufficient documentation

## 2018-06-25 DIAGNOSIS — I482 Chronic atrial fibrillation, unspecified: Secondary | ICD-10-CM

## 2018-06-25 HISTORY — DX: Gastrointestinal hemorrhage, unspecified: K92.2

## 2018-06-25 LAB — COMPREHENSIVE METABOLIC PANEL
ALT: 12 U/L (ref 0–44)
AST: 16 U/L (ref 15–41)
Albumin: 3.7 g/dL (ref 3.5–5.0)
Alkaline Phosphatase: 48 U/L (ref 38–126)
Anion gap: 13 (ref 5–15)
BUN: 35 mg/dL — ABNORMAL HIGH (ref 8–23)
CO2: 18 mmol/L — ABNORMAL LOW (ref 22–32)
Calcium: 8.9 mg/dL (ref 8.9–10.3)
Chloride: 103 mmol/L (ref 98–111)
Creatinine, Ser: 1.6 mg/dL — ABNORMAL HIGH (ref 0.44–1.00)
GFR calc Af Amer: 40 mL/min — ABNORMAL LOW (ref >60)
GFR calc non Af Amer: 34 mL/min — ABNORMAL LOW (ref >60)
Glucose, Bld: 410 mg/dL — ABNORMAL HIGH (ref 70–99)
POTASSIUM: 4.5 mmol/L (ref 3.5–5.1)
Sodium: 134 mmol/L — ABNORMAL LOW (ref 135–145)
Total Bilirubin: 0.5 mg/dL (ref 0.3–1.2)
Total Protein: 6.6 g/dL (ref 6.5–8.1)

## 2018-06-25 LAB — CBC
HCT: 30.5 % — ABNORMAL LOW (ref 36.0–46.0)
Hemoglobin: 8.8 g/dL — ABNORMAL LOW (ref 12.0–15.0)
MCH: 22.8 pg — ABNORMAL LOW (ref 26.0–34.0)
MCHC: 28.9 g/dL — ABNORMAL LOW (ref 30.0–36.0)
MCV: 79 fL — AB (ref 80.0–100.0)
NRBC: 0 % (ref 0.0–0.2)
Platelets: 264 10*3/uL (ref 150–400)
RBC: 3.86 MIL/uL — ABNORMAL LOW (ref 3.87–5.11)
RDW: 18.6 % — ABNORMAL HIGH (ref 11.5–15.5)
WBC: 10.2 10*3/uL (ref 4.0–10.5)

## 2018-06-25 LAB — CBG MONITORING, ED
Glucose-Capillary: 418 mg/dL — ABNORMAL HIGH (ref 70–99)
Glucose-Capillary: 293 mg/dL — ABNORMAL HIGH (ref 70–99)

## 2018-06-25 LAB — URINALYSIS, ROUTINE W REFLEX MICROSCOPIC
Bilirubin Urine: NEGATIVE
Hgb urine dipstick: NEGATIVE
Ketones, ur: NEGATIVE mg/dL
Leukocytes, UA: NEGATIVE
NITRITE: NEGATIVE
Protein, ur: NEGATIVE mg/dL
Specific Gravity, Urine: 1.024 (ref 1.005–1.030)
pH: 5 (ref 5.0–8.0)

## 2018-06-25 LAB — I-STAT TROPONIN, ED: Troponin i, poc: 0 ng/mL (ref 0.00–0.08)

## 2018-06-25 LAB — GLUCOSE, CAPILLARY: Glucose-Capillary: 136 mg/dL — ABNORMAL HIGH (ref 70–99)

## 2018-06-25 MED ORDER — ATORVASTATIN CALCIUM 40 MG PO TABS
40.0000 mg | ORAL_TABLET | Freq: Every day | ORAL | Status: DC
  Administered 2018-06-26: 40 mg via ORAL
  Filled 2018-06-25: qty 1

## 2018-06-25 MED ORDER — SODIUM CHLORIDE 0.9 % IV BOLUS
1000.0000 mL | Freq: Once | INTRAVENOUS | Status: AC
  Administered 2018-06-25: 1000 mL via INTRAVENOUS

## 2018-06-25 MED ORDER — SODIUM CHLORIDE 0.9 % IV SOLN: INTRAVENOUS | Status: DC

## 2018-06-25 MED ORDER — INSULIN ASPART 100 UNIT/ML ~~LOC~~ SOLN
0.0000 [IU] | Freq: Three times a day (TID) | SUBCUTANEOUS | Status: DC
  Administered 2018-06-26: 3 [IU] via SUBCUTANEOUS

## 2018-06-25 MED ORDER — LACTATED RINGERS IV BOLUS
1000.0000 mL | Freq: Once | INTRAVENOUS | Status: AC
  Administered 2018-06-25: 1000 mL via INTRAVENOUS

## 2018-06-25 MED ORDER — MUPIROCIN 2 % EX OINT: 1.0000 "application " | TOPICAL_OINTMENT | Freq: Two times a day (BID) | CUTANEOUS | Status: DC

## 2018-06-25 MED ORDER — APIXABAN 5 MG PO TABS
5.0000 mg | ORAL_TABLET | Freq: Two times a day (BID) | ORAL | Status: DC
  Administered 2018-06-25 – 2018-06-26 (×2): 5 mg via ORAL
  Filled 2018-06-25 (×2): qty 1

## 2018-06-25 MED ORDER — ATORVASTATIN CALCIUM 40 MG PO TABS: 40.0000 mg | ORAL_TABLET | Freq: Every day | ORAL | Status: DC

## 2018-06-25 MED ORDER — PANTOPRAZOLE SODIUM 40 MG PO TBEC
40.0000 mg | DELAYED_RELEASE_TABLET | Freq: Every day | ORAL | Status: DC
  Administered 2018-06-26: 40 mg via ORAL
  Filled 2018-06-25: qty 1

## 2018-06-25 MED ORDER — FERROUS SULFATE 325 (65 FE) MG PO TABS
325.0000 mg | ORAL_TABLET | Freq: Two times a day (BID) | ORAL | Status: DC
  Administered 2018-06-26: 325 mg via ORAL
  Filled 2018-06-25: qty 1

## 2018-06-25 MED ORDER — AMIODARONE HCL 200 MG PO TABS
200.0000 mg | ORAL_TABLET | Freq: Every day | ORAL | Status: DC
  Administered 2018-06-26: 200 mg via ORAL
  Filled 2018-06-25: qty 1

## 2018-06-25 MED ORDER — LORATADINE 10 MG PO TABS
10.0000 mg | ORAL_TABLET | Freq: Every day | ORAL | Status: DC
  Administered 2018-06-26: 10 mg via ORAL
  Filled 2018-06-25: qty 1

## 2018-06-25 MED ORDER — ONDANSETRON HCL 4 MG/2ML IJ SOLN: 4.0000 mg | Freq: Four times a day (QID) | INTRAMUSCULAR | Status: DC | PRN

## 2018-06-25 MED ORDER — ONDANSETRON 4 MG PO TBDP
4.0000 mg | ORAL_TABLET | Freq: Three times a day (TID) | ORAL | Status: DC | PRN
  Filled 2018-06-25: qty 1

## 2018-06-25 MED ORDER — LORATADINE 10 MG PO TABS: 10.0000 mg | ORAL_TABLET | Freq: Every day | ORAL | Status: DC

## 2018-06-25 MED ORDER — HYPROMELLOSE (GONIOSCOPIC) 2.5 % OP SOLN
1.0000 [drp] | OPHTHALMIC | Status: DC | PRN
  Filled 2018-06-25: qty 15

## 2018-06-25 MED ORDER — SODIUM CHLORIDE 0.9 % IV SOLN: INTRAVENOUS | Status: AC

## 2018-06-25 MED ORDER — INSULIN ASPART 100 UNIT/ML ~~LOC~~ SOLN
4.0000 [IU] | Freq: Once | SUBCUTANEOUS | Status: AC
  Administered 2018-06-25: 4 [IU] via SUBCUTANEOUS

## 2018-06-25 MED ORDER — OMEGA-3-ACID ETHYL ESTERS 1 G PO CAPS
2.0000 g | ORAL_CAPSULE | Freq: Two times a day (BID) | ORAL | Status: DC
  Administered 2018-06-25 – 2018-06-26 (×2): 2 g via ORAL
  Filled 2018-06-25 (×2): qty 2

## 2018-06-25 MED ORDER — DICYCLOMINE HCL 20 MG PO TABS
20.0000 mg | ORAL_TABLET | Freq: Two times a day (BID) | ORAL | Status: DC
  Administered 2018-06-25 – 2018-06-26 (×2): 20 mg via ORAL
  Filled 2018-06-25 (×2): qty 1

## 2018-06-25 MED ORDER — ACETAMINOPHEN 325 MG PO TABS
650.0000 mg | ORAL_TABLET | ORAL | Status: DC | PRN
  Administered 2018-06-25: 650 mg via ORAL
  Filled 2018-06-25: qty 2

## 2018-06-25 MED ORDER — FENOFIBRATE 160 MG PO TABS
160.0000 mg | ORAL_TABLET | Freq: Every day | ORAL | Status: DC
  Administered 2018-06-26: 160 mg via ORAL
  Filled 2018-06-25: qty 1

## 2018-06-25 MED ORDER — POLYVINYL ALCOHOL 1.4 % OP SOLN
1.0000 [drp] | OPHTHALMIC | Status: DC | PRN
  Filled 2018-06-25: qty 15

## 2018-06-25 MED ORDER — INSULIN GLARGINE 100 UNIT/ML ~~LOC~~ SOLN
25.0000 [IU] | Freq: Every day | SUBCUTANEOUS | Status: DC
  Administered 2018-06-25: 25 [IU] via SUBCUTANEOUS
  Filled 2018-06-25 (×2): qty 0.25

## 2018-06-25 MED ORDER — PANTOPRAZOLE SODIUM 40 MG PO TBEC: 40.0000 mg | DELAYED_RELEASE_TABLET | Freq: Every day | ORAL | Status: DC

## 2018-06-25 MED ORDER — ONDANSETRON HCL 4 MG/2ML IJ SOLN
4.0000 mg | Freq: Once | INTRAMUSCULAR | Status: AC
  Administered 2018-06-25: 4 mg via INTRAVENOUS
  Filled 2018-06-25: qty 2

## 2018-06-25 NOTE — ED Triage Notes (Signed)
Pt arrives via EMS with reports of Pt was walking and felt like she was going to pass out, HR was 34 and BP 86/42 for EMS. Pt pale. 0.5 mg atropine, 500 cc bolus given. Pt reports taking all daily medications. Pt endorses weakness and dizziness.

## 2018-06-25 NOTE — ED Provider Notes (Signed)
[facility] EMERGENCY DEPARTMENT Provider Note   CSN: [id number] Arrival date & time: 06/25/18  1250     History   Chief Complaint Chief Complaint  Patient presents with  . Near Syncope  . Bradycardia    HPI Emily Farmer is a 63 y.o. female.  63yo F w/ PMH including Afib on anticoagulation, CHF, HTN, HLD, pancreatitis, T2DM who p/w near-syncope and bradycardia.  Patient was recently admitted to the hospital and since discharge home she has been feeling well.  She has been compliant with her medications.  This afternoon she was walking and began feeling lightheaded like she was going to pass out.  She began feeling some pain in her legs, shoulders, and neck but did not have any chest pain or shortness of breath.  EMS was called and they noted that her heart rate was 34 and BP 86/42 on their arrival.  She improved after receiving 0.5 mg atropine and 500 mL's fluids.  Currently the patient feels back to normal and denies any complaints.  No fevers, vomiting, or cough. She never lost consciousness.  The history is provided by the patient.  Near Syncope     Past Medical History:  Diagnosis Date  . Arthritis   . Atrial fibrillation ([place])    a. 09/2016 in setting of pancreatitis;  b. 09/2016 Echo: EF 65-60%, no rwma, Gr2 DD, mild MR, triv TR, PASP 36mHg;  CHA2DS2VASc = 4-->Eliquis 537mBID.  . Marland Kitchenhronic diastolic CHF (congestive heart failure) (HCLawrence8/15/2019  . Diabetes mellitus   . Gout   . Hyperlipidemia   . Hypertension   . Hypertriglyceridemia   . Pancreatitis    a. 09/2016 - Triglycerides 1,392 on admission.    Patient Active Problem List   Diagnosis Date Noted  . Pericardial effusion 06/21/2018  . Symptomatic anemia 06/18/2018  . Hyperosmolar non-ketotic state in patient with type 2 diabetes mellitus ([facility]08/16/2019  . Hyperglycemic hyperosmolar nonketotic coma (HCGardiner08/15/2019  . Atrial fibrillation with RVR (HCPercival08/15/2019  . Non-compliance  01/28/2018  . Hyponatremia 01/28/2018  . Normocytic anemia 01/28/2018  . Chronic diastolic CHF (congestive heart failure) (HCAlba08/15/2019  . Labial abscess 01/28/2018  . Orthostatic hypotension 01/28/2018  . Mixed hyperlipidemia 06/01/2017  . Acute pancreatitis 10/04/2016  . Paroxysmal A-fib (HCWisdom04/21/2018  . Cholecystitis, acute with cholelithiasis 01/04/2014  . Hypokalemia 01/04/2014  . Pyelonephritis 02/15/2013  . Dehydration 02/15/2013  . Hyperglycemia 02/15/2013  . Increased anion gap metabolic acidosis 0942/59/5638. Essential hypertension 02/15/2013  . Hypertriglyceridemia 02/15/2013  . Diabetes mellitus type 2 in obese (HCPrescott09/07/2012  . Coronary atherosclerosis seen on CT 02/15/2013  . Nausea and vomiting 02/15/2013  . Diarrhea 02/15/2013    Past Surgical History:  Procedure Laterality Date  . BIOPSY  06/20/2018   Procedure: BIOPSY;  Surgeon: MaClarene EssexMD;  [facility];  Service: Endoscopy;;  . CHOLECYSTECTOMY N/A 01/04/2014   Procedure: LAPAROSCOPIC CHOLECYSTECTOMY WITH INTRAOPERATIVE CHOLANGIOGRAM;  Surgeon: ArRalene OkMD;  [facility]: General;  Laterality: N/A;  . COLONOSCOPY WITH PROPOFOL N/A 06/21/2018   Procedure: COLONOSCOPY WITH PROPOFOL;  Surgeon: BuRonald LoboMD;  [facility]: [facility];  Laterality: N/A;  . ESOPHAGOGASTRODUODENOSCOPY (EGD) WITH PROPOFOL N/A 06/20/2018   Procedure: ESOPHAGOGASTRODUODENOSCOPY (EGD) WITH PROPOFOL;  Surgeon: MaClarene EssexMD;  [facility]: [facility];  Laterality: N/A;  . LUNG SURGERY       OB History   No obstetric history on file.  Home Medications    Prior to Admission medications   Medication Sig Start Date End Date Taking? Authorizing Provider  acetaminophen (TYLENOL) 325 MG tablet Take 2 tablets (650 mg total) by mouth every 6 (six) hours as needed for mild pain (or Fever >/= 101). 02/01/18   Elgergawy, Silver Huguenin, MD  amiodarone (PACERONE)  200 MG tablet Take 1 tab (200 mg) by mouth 2 times daily for 1 week then reduce to 1 tab (200 mg) 1 time daily thereafter. 06/24/18   Hongalgi, Lenis Dickinson, MD  apixaban (ELIQUIS) 5 MG TABS tablet Take 1 tablet (5 mg total) by mouth 2 (two) times daily. 05/11/18   Hilty, Nadean Corwin, MD  atorvastatin (LIPITOR) 40 MG tablet Take 1 tablet (40 mg total) by mouth daily. 05/11/18   Hilty, Nadean Corwin, MD  Blood Glucose Monitoring Suppl (TRUE METRIX METER) w/Device KIT Use to monitor blood sugar as directed 02/22/18   Fulp, Cammie, MD  cetirizine (ZYRTEC) 10 MG tablet Take 1 tablet (10 mg total) by mouth daily. 04/05/18   Charlott Rakes, MD  dicyclomine (BENTYL) 20 MG tablet Take 1 tablet (20 mg total) by mouth 2 (two) times daily. 03/14/18   Joy, Shawn C, PA-C  diltiazem (CARDIZEM CD) 240 MG 24 hr capsule Take 1 capsule (240 mg total) by mouth daily. 06/25/18   Hongalgi, Lenis Dickinson, MD  fenofibrate 160 MG tablet Take 1 tablet (160 mg total) by mouth daily. 05/11/18   Hilty, Nadean Corwin, MD  ferrous sulfate 325 (65 FE) MG tablet Take 1 tablet (325 mg total) by mouth 2 (two) times daily with a meal. 06/24/18   Hongalgi, Lenis Dickinson, MD  glucose blood (TRUE METRIX BLOOD GLUCOSE TEST) test strip Use as instructed to check blood sugars 3 times per day 02/22/18   Fulp, Cammie, MD  hydroxypropyl methylcellulose / hypromellose (ISOPTO TEARS / GONIOVISC) 2.5 % ophthalmic solution Place 1 drop into both eyes as needed for dry eyes.    [provider]  Insulin Glargine (LANTUS SOLOSTAR) 100 UNIT/ML Solostar Pen Inject 25 units daily. If sugars are > 130 after 3 days, increase to 28 units. If they remain elevated, increase to 30 units. 05/06/18   Fulp, Cammie, MD  Insulin Pen Needle (TRUEPLUS PEN NEEDLES) 32G X 4 MM MISC Use with insulin pen to inject Lantus daily. 03/22/18   Fulp, Cammie, MD  Insulin Syringe-Needle U-100 (TRUEPLUS INSULIN SYRINGE) 31G X 5/16" 0.3 ML MISC Use to inject insulin daily. 05/06/18   Fulp, Cammie, MD    metFORMIN (GLUCOPHAGE) 500 MG tablet Take 2 tablets (1,000 mg total) by mouth 2 (two) times daily with a meal. 03/22/18   Fulp, Cammie, MD  Multiple Vitamins-Minerals (CENTRUM SILVER PO) Take 1 tablet by mouth daily.    [provider]  mupirocin ointment (BACTROBAN) 2 % Place 1 application into the nose 2 (two) times daily as needed. 06/24/18   Hongalgi, Lenis Dickinson, MD  omega-3 acid ethyl esters (LOVAZA) 1 g capsule Take 2 capsules (2 g total) by mouth 2 (two) times daily. 02/01/18   Elgergawy, Silver Huguenin, MD  pantoprazole (PROTONIX) 40 MG tablet Take 1 tablet (40 mg total) by mouth daily. 06/24/18   Hongalgi, Lenis Dickinson, MD  TRUEPLUS LANCETS 28G MISC Use to check blood sugar 3 times per day 02/22/18   Antony Blackbird, MD    Family History Family History  Problem Relation Age of Onset  . Heart attack Mother   . Heart attack Father   .  Diabetes Sister   . High blood pressure Neg Hx   . High Cholesterol Neg Hx     Social History Social History   Tobacco Use  . Smoking status: Never Smoker  . Smokeless tobacco: Never Used  Substance Use Topics  . Alcohol use: No  . Drug use: No     Allergies   Patient has no known allergies.   Review of Systems Review of Systems  Cardiovascular: Positive for near-syncope.   All other systems reviewed and are negative except that which was mentioned in HPI   Physical Exam Updated Vital Signs BP (!) 92/51   Pulse 83   Temp 97.6 F (36.4 C) (Oral)   Resp 13   Ht 5' 3" (1.6 m)   Wt 64 kg   SpO2 100%   BMI 24.98 kg/m   Physical Exam Vitals signs and nursing note reviewed.  Constitutional:      General: She is not in acute distress.    Appearance: She is well-developed.  HENT:     Head: Normocephalic and atraumatic.  Eyes:     Conjunctiva/sclera: Conjunctivae normal.  Neck:     Musculoskeletal: Neck supple.  Cardiovascular:     Rate and Rhythm: Regular rhythm. Bradycardia present.     Heart sounds: Normal heart sounds. No murmur.   Pulmonary:     Effort: Pulmonary effort is normal.     Breath sounds: Normal breath sounds.  Abdominal:     General: Bowel sounds are normal. There is no distension.     Palpations: Abdomen is soft.     Tenderness: There is no abdominal tenderness.  Skin:    General: Skin is warm and dry.  Neurological:     Mental Status: She is alert and oriented to person, place, and time.     Comments: Fluent speech  Psychiatric:        Judgment: Judgment normal.      ED Treatments / Results  Labs (all labs ordered are listed, but only abnormal results are displayed) Labs Reviewed  COMPREHENSIVE METABOLIC PANEL - Abnormal; Notable for the following components:      Result Value   Sodium 134 (*)    CO2 18 (*)    Glucose, Bld 410 (*)    BUN 35 (*)    Creatinine, Ser 1.60 (*)    GFR calc non Af Amer 34 (*)    GFR calc Af Amer 40 (*)    All other components within normal limits  CBC - Abnormal; Notable for the following components:   RBC 3.86 (*)    Hemoglobin 8.8 (*)    HCT 30.5 (*)    MCV 79.0 (*)    MCH 22.8 (*)    MCHC 28.9 (*)    RDW 18.6 (*)    All other components within normal limits  CBG MONITORING, ED - Abnormal; Notable for the following components:   Glucose-Capillary 418 (*)    All other components within normal limits  I-STAT TROPONIN, ED    EKG EKG Interpretation  Date/Time:  Friday June 25 2018 12:52:39 EST Ventricular Rate:  45 PR Interval:    QRS Duration: 84 QT Interval:  470 QTC Calculation: 407 R Axis:   54 Text Interpretation:  Atrial fibrillation Borderline repolarization abnormality since previous tracing, has gone from A fib to RVR to slow A fib Confirmed by Theotis Burrow [phone]) on 06/25/2018 1:39:40 PM   Radiology No results found.  Procedures Procedures (including critical care time)  Medications Ordered in ED Medications  lactated ringers bolus 1,000 mL (1,000 mLs Intravenous New Bag/Given 06/25/18 1522)     Initial Impression /  Assessment and Plan / ED Course  I have reviewed the triage vital signs and the nursing notes.  Pertinent labs & imaging results that were available during my care of the patient were reviewed by me and considered in my medical decision making (see chart for details).    She was comfortable and alert on exam, initial BP 92/51, HR variable 40s-70s. EKG shows slow A fib. Labs show negative trop, BUN 35 and Cr 1.6 which suggests dehydration. This may have caused near syncopal episode. Gave IVF bolus. Glucose 418 which may be contributing to dehydration. No evidence of DKA. Hgb stable at 8.8 and she denies any bleeding at home.  She remains well-appearing on reassessment with normal blood pressure, heart rate continues to be in the 50s.  I have contacted cardiology for evaluation as she may need a medication adjustment that is slightly less aggressive to avoid symptomatic bradycardia.  Her blood glucose has improved to 293, will complete fluid bolus and small dose of insulin.  She has been compliant with her insulin regimen.  I have discussed that she needs to follow-up with her PCP regarding her blood sugar control and her creatinine. I am signing the patient out to oncoming provider pending cardiology recs and completion of fluids.  Final Clinical Impressions(s) / ED Diagnoses   Final diagnoses:  None    ED Discharge Orders    None       Little, Wenda Overland, MD 06/25/18 1642

## 2018-06-25 NOTE — ED Provider Notes (Signed)
  Physical Exam  BP 128/62   Pulse 65   Temp 97.6 F (36.4 C) (Oral)   Resp (!) 22   Ht 5\' 3"  (1.6 m)   Wt 64 kg   SpO2 96%   BMI 24.98 kg/m   Physical Exam  ED Course/Procedures     Procedures  MDM  Care assumed at 4 PM from Dr. Clarene Duke.  Patient here with near syncope and bradycardia.  Patient was initially hypotensive and bradycardic in the 50s.  After some IV fluids her heart rate went up to the 70s and her blood pressure normalized.  Signout pending cardiology evaluation for adjustment of her meds.  5 pm Patient still had some nausea and did not feel well.  CT head obtained and was unremarkable.  Patient's blood pressure improved but still feels dizzy. Cardiology saw patient and will admit for observation for symptomatic bradycardia      Charlynne Pander, MD 06/25/18 [phone]

## 2018-06-25 NOTE — H&P (Addendum)
Cardiology Admission History and Physical:   Farmer ID: Emily Farmer MRN: [id number]; DOB: 1955/07/12   Admission date: 06/25/2018  Primary Care Provider: Antony Blackbird, MD Primary Cardiologist: No primary care provider on file.  Primary Electrophysiologist:  None   Chief Complaint:  syncope  Farmer Profile:   Emily Farmer is a 63 y.o. female with paroxysmal atrial fibrillation, recently hospitalized with gastrointestinal bleeding and recurrent atrial fibrillation with associated diastolic heart failure.  She was discharged yesterday on oral amiodarone and oral diltiazem, now presenting with sudden onset of weakness, lightheadedness, and near syncope.  History of Present Illness:   Emily Farmer has a history of paroxysmal atrial fibrillation.  She was hospitalized January 3 through June 24, 2018 with symptomatic anemia and atrial fibrillation.  Her medications were adjusted.  Home metoprolol was discontinued.  She was discharged yesterday on oral amiodarone and diltiazem.  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  She went to [facility] ground but did not clearly have frank syncope.  On arrival to [facility] emergency department she was noted to have slow atrial fibrillation.  She has subsequently converted to sinus rhythm.  She has evidence of acute kidney injury with recent creatinine of 0.8, today up to 1.6.  Her hemoglobin and other labs are stable.  At Emily time of my evaluation her sister is present in Emily room.  Emily Farmer is feeling better but did have recent nausea and vomiting after eating a [place] sandwich here in [facility] emergency room.  She currently denies chest pain, shortness of breath, or lightheadedness.  She has no other complaints at this time.   Past Medical History:  Diagnosis Date  . Arthritis   . Atrial fibrillation ([place])    a. 09/2016 in setting of pancreatitis;  b.  09/2016 Echo: EF 65-60%, no rwma, Gr2 DD, mild MR, triv TR, PASP 35mHg;  CHA2DS2VASc = 4-->Eliquis 535mBID.  . Marland Kitchenhronic diastolic CHF (congestive heart failure) ([facility]/15/2019  . Diabetes mellitus   . Gout   . Hyperlipidemia   . Hypertension   . Hypertriglyceridemia   . Pancreatitis    a. 09/2016 - Triglycerides 1,392 on admission.    Past Surgical History:  Procedure Laterality Date  . BIOPSY  06/20/2018   Procedure: BIOPSY;  Surgeon: MaClarene EssexMD;  [facility];  Service: Endoscopy;;  . CHOLECYSTECTOMY N/A 01/04/2014   Procedure: LAPAROSCOPIC CHOLECYSTECTOMY WITH INTRAOPERATIVE CHOLANGIOGRAM;  Surgeon: ArRalene OkMD;  [facility] Service: General;  Laterality: N/A;  . COLONOSCOPY WITH PROPOFOL N/A 06/21/2018   Procedure: COLONOSCOPY WITH PROPOFOL;  Surgeon: BuRonald LoboMD;  [facility] Service: Endoscopy;  Laterality: N/A;  . ESOPHAGOGASTRODUODENOSCOPY (EGD) WITH PROPOFOL N/A 06/20/2018   Procedure: ESOPHAGOGASTRODUODENOSCOPY (EGD) WITH PROPOFOL;  Surgeon: MaClarene EssexMD;  [facility] Service: Endoscopy;  Laterality: N/A;  . LUNG SURGERY       Medications Prior to Admission: Prior to Admission medications   Medication Sig Start Date End Date Taking? Authorizing Provider  acetaminophen (TYLENOL) 325 MG tablet Take 2 tablets (650 mg total) by mouth every 6 (six) hours as needed for mild pain (or Fever >/= 101). 02/01/18  Yes Elgergawy, DaSilver HugueninMD  apixaban (ELIQUIS) 5 MG TABS tablet Take 1 tablet (5 mg total) by mouth 2 (two) times daily. 05/11/18  Yes Hilty, KeNadean CorwinMD  atorvastatin (LIPITOR) 40 MG tablet Take 1  tablet (40 mg total) by mouth daily. 05/11/18  Yes Hilty, Nadean Corwin, MD  cetirizine (ZYRTEC) 10 MG tablet Take 1 tablet (10 mg total) by mouth daily. 04/05/18  Yes Charlott Rakes, MD  dicyclomine (BENTYL) 20 MG tablet Take 1 tablet (20 mg total) by mouth 2 (two) times daily. 03/14/18  Yes Joy, Shawn C, PA-C  diltiazem  (CARDIZEM CD) 240 MG 24 hr capsule Take 1 capsule (240 mg total) by mouth daily. 06/25/18  Yes Hongalgi, Lenis Dickinson, MD  fenofibrate 160 MG tablet Take 1 tablet (160 mg total) by mouth daily. 05/11/18  Yes Hilty, Nadean Corwin, MD  ferrous sulfate 325 (65 FE) MG tablet Take 1 tablet (325 mg total) by mouth 2 (two) times daily with a meal. 06/24/18  Yes Hongalgi, Lenis Dickinson, MD  hydroxypropyl methylcellulose / hypromellose (ISOPTO TEARS / GONIOVISC) 2.5 % ophthalmic solution Place 1 drop into both eyes as needed for dry eyes.   Yes [provider]  Insulin Glargine (LANTUS SOLOSTAR) 100 UNIT/ML Solostar Pen Inject 25 units daily. If sugars are > 130 after 3 days, increase to 28 units. If they remain elevated, increase to 30 units. 05/06/18  Yes Fulp, Cammie, MD  metFORMIN (GLUCOPHAGE) 500 MG tablet Take 2 tablets (1,000 mg total) by mouth 2 (two) times daily with a meal. 03/22/18  Yes Fulp, Cammie, MD  Multiple Vitamins-Minerals (CENTRUM SILVER PO) Take 1 tablet by mouth daily.   Yes [provider]  mupirocin ointment (BACTROBAN) 2 % Place 1 application into Emily nose 2 (two) times daily as needed. Farmer taking differently: Place 1 application into Emily nose 2 (two) times daily.  06/24/18  Yes Hongalgi, Lenis Dickinson, MD  omega-3 acid ethyl esters (LOVAZA) 1 g capsule Take 2 capsules (2 g total) by mouth 2 (two) times daily. 02/01/18  Yes Elgergawy, Silver Huguenin, MD  ondansetron (ZOFRAN-ODT) 4 MG disintegrating tablet Take 4 mg by mouth every 8 (eight) hours as needed for nausea or vomiting.   Yes [provider]  pantoprazole (PROTONIX) 40 MG tablet Take 1 tablet (40 mg total) by mouth daily. 06/24/18  Yes Hongalgi, Lenis Dickinson, MD  amiodarone (PACERONE) 200 MG tablet Take 1 tab (200 mg) by mouth 2 times daily for 1 week then reduce to 1 tab (200 mg) 1 time daily thereafter. Farmer not taking: Reported on 06/25/2018 06/24/18   Modena Jansky, MD  Blood Glucose Monitoring Suppl (TRUE METRIX METER) w/Device  KIT Use to monitor blood sugar as directed 02/22/18   Fulp, Cammie, MD  glucose blood (TRUE METRIX BLOOD GLUCOSE TEST) test strip Use as instructed to check blood sugars 3 times per day 02/22/18   Fulp, Cammie, MD  Insulin Pen Needle (TRUEPLUS PEN NEEDLES) 32G X 4 MM MISC Use with insulin pen to inject Lantus daily. 03/22/18   Fulp, Cammie, MD  Insulin Syringe-Needle U-100 (TRUEPLUS INSULIN SYRINGE) 31G X 5/16" 0.3 ML MISC Use to inject insulin daily. 05/06/18   Fulp, Ander Gaster, MD  TRUEPLUS LANCETS 28G MISC Use to check blood sugar 3 times per day 02/22/18   Antony Blackbird, MD     Allergies:   No Known Allergies  Social History:   Social History   Socioeconomic History  . Marital status: Single    Spouse name: Not on file  . Number of children: Not on file  . Years of education: Not on file  . Highest education level: Not on file  Occupational History  . Not on file  Social  Needs  . Financial resource strain: Not on file  . Food insecurity:    Worry: Not on file    Inability: Not on file  . Transportation needs:    Medical: Not on file    Non-medical: Not on file  Tobacco Use  . Smoking status: Never Smoker  . Smokeless tobacco: Never Used  Substance and Sexual Activity  . Alcohol use: No  . Drug use: No  . Sexual activity: Not Currently  Lifestyle  . Physical activity:    Days per week: Not on file    Minutes per session: Not on file  . Stress: Not on file  Relationships  . Social connections:    Talks on phone: Not on file    Gets together: Not on file    Attends religious service: Not on file    Active member of club or organization: Not on file    Attends meetings of clubs or organizations: Not on file    Relationship status: Not on file  . Intimate partner violence:    Fear of current or ex partner: Not on file    Emotionally abused: Not on file    Physically abused: Not on file    Forced sexual activity: Not on file  Other Topics Concern  . Not on file  Social  History Narrative   Lives with her sister and does not need any assistance with ADLs.      Family History:   Emily Farmer's family history includes Diabetes in her sister; Heart attack in her father and mother. There is no history of High blood pressure or High Cholesterol.    ROS:  Please see Emily history of present illness.  Positive for nausea and vomiting.  All other ROS reviewed and negative.     Physical Exam/Data:   Vitals:   06/25/18 1700 06/25/18 1715 06/25/18 1730 06/25/18 1745  BP: 123/65 128/64 137/65 (!) 145/68  Pulse: (!) 52 60 64 64  Resp: 18 (!) '22 19 17  ' Temp:      TempSrc:      SpO2: 100% 98% 99% 97%  Weight:      Height:        Intake/Output Summary (Last 24 hours) at 06/25/2018 1807 Last data filed at 06/25/2018 1755 Gross per 24 hour  Intake 2000 ml  Output -  Net 2000 ml   Last 3 Weights 06/25/2018 06/21/2018 06/19/2018  Weight (lbs) 141 lb 141 lb 147 lb 0.8 oz  Weight (kg) 63.957 kg 63.957 kg 66.7 kg     Body mass index is 24.98 kg/m.  General:  Well nourished, well developed, in no acute distress HEENT: normal Lymph: no adenopathy Neck: no JVD Endocrine:  No thryomegaly Vascular: No carotid bruits; FA pulses 2+ bilaterally  Cardiac:  normal S1, S2; RRR; no murmur  Lungs:  clear to auscultation bilaterally, no wheezing, rhonchi or rales  Abd: soft, nontender, no hepatomegaly  Ext: Trace bilateral pretibial edema Musculoskeletal:  No deformities, BUE and BLE strength normal and equal Skin: warm and dry  Neuro:  CNs 2-12 intact, no focal abnormalities noted Psych:  Normal affect    EKG:  Emily ECG that was done today was personally reviewed and demonstrates slow atrial fibrillation 45 bpm.  Relevant CV Studies: 2D echocardiogram 06/19/2018: Study Conclusions  - Left ventricle: Emily cavity size was normal. Wall thickness was   normal. Systolic function was normal. Emily estimated ejection   fraction was in Emily range of 55%  to 60%. Wall motion was  normal;   there were no regional wall motion abnormalities. Emily study is   not technically sufficient to allow evaluation of LV diastolic   function. - Aortic valve: Trileaflet; mildly thickened, mildly calcified   leaflets. - Mitral valve: There was mild regurgitation. - Right atrium: Emily atrium was mildly dilated. - Pulmonary arteries: Systolic pressure was mildly increased. PA   peak pressure: 34 mm Hg (S). - Pericardium, extracardiac: A small pericardial effusion was   identified. There was no evidence of hemodynamic compromise.  Laboratory Data:  Chemistry Recent Labs  Lab 06/23/18 0241 06/25/18 1326  NA 136 134*  K 3.7 4.5  CL 102 103  CO2 25 18*  GLUCOSE 90 410*  BUN 19 35*  CREATININE 1.11* 1.60*  CALCIUM 9.3 8.9  GFRNONAA 54* 34*  GFRAA >60 40*  ANIONGAP 9 13    Recent Labs  Lab 06/23/18 0241 06/25/18 1326  PROT 6.5 6.6  ALBUMIN 3.5 3.7  AST 13* 16  ALT 12 12  ALKPHOS 50 48  BILITOT 0.3 0.5   Hematology Recent Labs  Lab 06/24/18 0740 06/25/18 1326  WBC 6.6 10.2  RBC 3.76* 3.86*  HGB 8.5* 8.8*  HCT 28.5* 30.5*  MCV 75.8* 79.0*  MCH 22.6* 22.8*  MCHC 29.8* 28.9*  RDW 18.1* 18.6*  PLT 265 264   Cardiac EnzymesNo results for input(s): TROPONINI in Emily last 168 hours.  Recent Labs  Lab 06/25/18 1329  TROPIPOC 0.00    BNPNo results for input(s): BNP, PROBNP in Emily last 168 hours.  DDimer No results for input(s): DDIMER in Emily last 168 hours.  Radiology/Studies:  No results found.  Assessment and Plan:   1. Slow atrial fibrillation with associated syncope: Emily Farmer has now converted to sinus rhythm.  She appears stable but does have acute kidney injury likely associated with hypoperfusion.  I think she should be observed overnight on telemetry.  I am going to discontinue her diltiazem and reduce her amiodarone dose to 200 mg daily.  We will continue to monitor.  If she remains in sinus rhythm tomorrow and her creatinine is stable I think  she can likely be discharged home in Emily morning.  Seems okay to continue on apixaban as her hemoglobin is stable. 2. Chronic diastolic heart failure: Appears euvolemic.  Just discharged from [facility] hospital yesterday and otherwise appears stable. 3. Type 2 diabetes: Uncontrolled at present.  Will place on sliding scale insulin.  Continue long-acting insulin.  Hold metformin with AKI. 4. Acute kidney injury: Suspect secondary to hypoperfusion.  Will hold metformin.  No ACE/ARB.  Repeat labs in Emily morning.  Will give gentle IV fluids.  Severity of Illness: Emily appropriate Farmer status for this Farmer is OBSERVATION. Observation status is judged to be reasonable and necessary in order to provide Emily required intensity of service to ensure Emily Farmer's safety. Emily Farmer's presenting symptoms, physical exam findings, and initial radiographic and laboratory data in Emily context of their medical condition is felt to place them at decreased risk for further clinical deterioration. Furthermore, it is anticipated that Emily Farmer will be medically stable for discharge from [facility] hospital within 2 midnights of admission.   For questions or updates, please contact [place] Please consult [email or web address] for contact info under   Signed, Sherren Mocha, MD  06/25/2018 6:07 PM

## 2018-06-26 ENCOUNTER — Encounter (HOSPITAL_COMMUNITY): Payer: Self-pay | Admitting: Physician Assistant

## 2018-06-26 DIAGNOSIS — N179 Acute kidney failure, unspecified: Secondary | ICD-10-CM

## 2018-06-26 LAB — CBC
HCT: 26.4 % — ABNORMAL LOW (ref 36.0–46.0)
Hemoglobin: 8.1 g/dL — ABNORMAL LOW (ref 12.0–15.0)
MCH: 23.5 pg — ABNORMAL LOW (ref 26.0–34.0)
MCHC: 30.7 g/dL (ref 30.0–36.0)
MCV: 76.5 fL — ABNORMAL LOW (ref 80.0–100.0)
Platelets: 227 10*3/uL (ref 150–400)
RBC: 3.45 MIL/uL — ABNORMAL LOW (ref 3.87–5.11)
RDW: 18.9 % — ABNORMAL HIGH (ref 11.5–15.5)
WBC: 7.1 10*3/uL (ref 4.0–10.5)
nRBC: 0 % (ref 0.0–0.2)

## 2018-06-26 LAB — GLUCOSE, CAPILLARY
GLUCOSE-CAPILLARY: 97 mg/dL (ref 70–99)
Glucose-Capillary: 67 mg/dL — ABNORMAL LOW (ref 70–99)
Glucose-Capillary: 167 mg/dL — ABNORMAL HIGH (ref 70–99)

## 2018-06-26 LAB — BASIC METABOLIC PANEL
Anion gap: 6 (ref 5–15)
BUN: 23 mg/dL (ref 8–23)
CO2: 25 mmol/L (ref 22–32)
Calcium: 8.7 mg/dL — ABNORMAL LOW (ref 8.9–10.3)
Chloride: 107 mmol/L (ref 98–111)
Creatinine, Ser: 0.86 mg/dL (ref 0.44–1.00)
GFR calc Af Amer: >60 mL/min (ref >60)
GFR calc non Af Amer: >60 mL/min (ref >60)
Glucose, Bld: 91 mg/dL (ref 70–99)
Potassium: 3.6 mmol/L (ref 3.5–5.1)
Sodium: 138 mmol/L (ref 135–145)

## 2018-06-26 LAB — TSH: TSH: 1.554 u[IU]/mL (ref 0.350–4.500)

## 2018-06-26 MED ORDER — AMIODARONE HCL 200 MG PO TABS: 200.0000 mg | ORAL_TABLET | Freq: Every day | ORAL | 1 refills | Status: DC

## 2018-06-26 NOTE — Discharge Summary (Addendum)
Discharge Summary    Patient ID: Emily Farmer,  MRN: [id number], DOB/AGE: 1956/04/09 63 y.o.  Admit date: 06/25/2018 Discharge date: 06/26/2018  Primary Care Provider: Antony Blackbird Primary Cardiologist: No primary care provider on file. Primary Electrophysiologist:  None  Discharge Diagnoses    Principal Problem:   Atrial fibrillation with slow ventricular response (HCC) Active Problems:   Essential hypertension   Chronic diastolic CHF (congestive heart failure) (HCC)   Symptomatic bradycardia   AKI (acute kidney injury) (HCC)   Diagnostic Studies/Procedures    N/A _____________     History of Present Illness     Emily Farmer is a 63 y.o. female with history of paroxysmal atrial fibrillation, HTN, HLD, chronic diastolic CHF, arthritis, pancreatitis, hypertriglyceridemia and recent GIB. She was recently hospitalized January 3 through June 24, 2018 with symptomatic anemia and atrial fibrillation. Her hemoglobin had dropped to 7.2, down from baseline around 10. FOBT was negative. Lab studies were remarkable for profound iron deficiency. She was transfused 2 unit PRBCs and consulted GI for endoscopy. EGD done on 06/20/2018 revealed chronic gastritis biopsied to rule out H. Pylori, a few non-bleeding small cratered and superficial duodenal ulcers with no stigmata of bleeding found in the duodenal bulb. She underwent colonoscopy 06/21/2018 which did not reveal source of iron deficiency anemia. PPI was started, duration to be determined by GI. Aspirin was discontinued indefinitely. During admission she had been started on IV amiodarone. Going home her metoprolol was discontinued, and she was discharged on amiodarone and diltiazem. 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 She went to the ground but did not clearly have frank syncope  On arrival to the emergency department she was  noted to have slow atrial fibrillation  She subsequently converted to sinus rhythm. She had evidence of acute kidney injury with recent creatinine of 0.8, up to 1.6 on arrival. Her hemoglobin was felt compatible with previous in the 8 range.  Hospital Course    She was admitted overnight for further management. Her diltiazem was discontinued and amiodarone was reduced to 279m daily. Metformin was held and she was given gentle IV hydration. This morning she feels much better, and creatinine improved. Dr. HPercival Spanishhas seen and examined the patient today and feels she is stable for discharge. She will keep previously arranged f/u 07/01/18.   She will need discussion of long term plan for amiodarone and organ systems as outpatient. It was noted this admission last TSH in 2013 was suppressed; have asked lab to add on to labs prior to DC. This will need to be followed up as OP. _____________  Discharge Vitals Blood pressure 123/68, pulse 78, temperature 98.6 F (37 C), temperature source Oral, resp. rate 18, height '5\' 3"'  (1.6 m), weight 68.5 kg, SpO2 100 %.  Filed Weights   06/25/18 1255 06/26/18 0451  Weight: 64 kg 68.5 kg    Labs & Radiologic Studies    CBC Recent Labs    06/25/18 1326 06/26/18 0328  WBC 10.2 7.1  HGB 8.8* 8.1*  HCT 30.5* 26.4*  MCV 79.0* 76.5*  PLT 264 2935  Basic Metabolic Panel Recent Labs    06/24/18 0226 06/25/18 1326 06/26/18 0328  NA  --  134* 138  K  --  4.5 3.6  CL  --  103 107  CO2  --  18* 25  GLUCOSE  --  410* 91  BUN  --  35* 23  CREATININE  --  1.60* 0.86  CALCIUM  --  8.9 8.7*  MG 1.8  --   --    Liver Function Tests Recent Labs    06/25/18 1326  AST 16  ALT 12  ALKPHOS 48  BILITOT 0.5  PROT 6.6  ALBUMIN 3.7  _____________  Dg Chest 2 View  Result Date: 06/25/2018 CLINICAL DATA:  Shortness of breath beginning this afternoon. EXAM: CHEST - 2 VIEW COMPARISON:  One-view chest x-ray 06/18/2018 FINDINGS: Heart size is exaggerated by  low lung volume. There is no edema or effusion. Chronic elevation the right hemidiaphragm is again noted. No focal airspace disease is present. The visualized soft tissues and bony thorax are unremarkable. IMPRESSION: 1. No acute cardiopulmonary disease. 2. Stable chronic changes. Electronically Signed   By: San Morelle M.D.   On: 06/25/2018 18:52   Dg Chest 2 View  Result Date: 06/04/2018 CLINICAL DATA:  Short of breath EXAM: CHEST - 2 VIEW COMPARISON:  03/07/2018 FINDINGS: Normal heart size. Postoperative changes in the right lower lobe with pulmonary parenchymal and pleural scarring are stable. Left lung is clear. Increased AP diameter of the chest. No pneumothorax or pleural effusion. IMPRESSION: No active cardiopulmonary disease. Electronically Signed   By: Marybelle Killings M.D.   On: 06/04/2018 09:23   Ct Head Wo Contrast  Result Date: 06/25/2018 CLINICAL DATA:  Syncope EXAM: CT HEAD WITHOUT CONTRAST TECHNIQUE: Contiguous axial images were obtained from the base of the skull through the vertex without intravenous contrast. COMPARISON:  06/08/2018. FINDINGS: Brain: There is no evidence for acute hemorrhage, hydrocephalus, mass lesion, or abnormal extra-axial fluid collection. No definite CT evidence for acute infarction. Vascular: No hyperdense vessel or unexpected calcification. Skull: No evidence for fracture. No worrisome lytic or sclerotic lesion. Sinuses/Orbits: The visualized paranasal sinuses and mastoid air cells are clear. Visualized portions of the globes and intraorbital fat are unremarkable. Other: None. IMPRESSION: Stable.  No acute intracranial abnormality. Electronically Signed   By: Misty Stanley M.D.   On: 06/25/2018 18:26   Ct Head Wo Contrast  Result Date: 06/08/2018 CLINICAL DATA:  Posttraumatic RIGHT-side headache, fell and struck her head against a bureau EXAM: CT HEAD WITHOUT CONTRAST TECHNIQUE: Contiguous axial images were obtained from the base of the skull through  the vertex without intravenous contrast. Sagittal and coronal MPR images reconstructed from axial data set. COMPARISON:  01/31/2018 FINDINGS: Brain: Generalized atrophy. Normal ventricular morphology. No midline shift or mass effect. Otherwise normal appearance of brain parenchyma. No intracranial hemorrhage, mass lesion, or evidence acute infarction. No extra-axial fluid collections. Vascular: Atherosclerotic calcifications within internal carotid arteries at skull base. No hyperdense vessels. Skull: Intact Sinuses/Orbits: Clear Other: N/A IMPRESSION: Generalized atrophy. No acute intracranial abnormalities. Electronically Signed   By: Lavonia Dana M.D.   On: 06/08/2018 12:24   Dg Chest Portable 1 View  Result Date: 06/18/2018 CLINICAL DATA:  Shortness of breath. Atrial fibrillation. EXAM: PORTABLE CHEST 1 VIEW COMPARISON:  06/04/2018 FINDINGS: The heart size and pulmonary vascularity are normal. No infiltrates or effusions. Chronic blunting of the right costophrenic angle laterally. Surgical clips in the right lung. Bones are normal. IMPRESSION: No acute cardiopulmonary abnormality. Electronically Signed   By: Lorriane Shire M.D.   On: 06/18/2018 08:44   Disposition   Pt is being discharged home today in good condition.  Follow-up Plans & Appointments    Follow-up Information    Fulp, Cammie, MD. Schedule an appointment as soon as possible for a  visit today.   Specialty:  Family Medicine Contact information: [place] [address] [phone]        Almyra Deforest, [place] Follow up.   Specialties:  Cardiology, Radiology Why:  Keep follow-up as previously arranged below - Isaac Laud is one of the PAs that works closely with our cardiology team. Contact information: [address] [place] [address] [phone]          Discharge Instructions    Diet - low sodium heart healthy   Complete by:  As directed    Discharge instructions   Complete by:  As  directed    Your diltiazem was stopped and amiodarone dose was decreased to 1 tablet once a day. Patients on amiodarone may need intermittent check-ups of other organ systems, such as lungs, thyroid, eyes, and liver function. Please talk to your provider at your follow-up appointments about what monitoring may be needed for you.   Increase activity slowly   Complete by:  As directed       Discharge Medications   Allergies as of 06/26/2018   No Known Allergies     Medication List    STOP taking these medications   diltiazem 240 MG 24 hr capsule Commonly known as:  CARDIZEM CD     TAKE these medications   acetaminophen 325 MG tablet Commonly known as:  TYLENOL Take 2 tablets (650 mg total) by mouth every 6 (six) hours as needed for mild pain (or Fever >/= 101).   amiodarone 200 MG tablet Commonly known as:  PACERONE Take 1 tablet (200 mg total) by mouth daily. New instructions What changed:    how much to take  how to take this  when to take this  additional instructions   apixaban 5 MG Tabs tablet Commonly known as:  ELIQUIS Take 1 tablet (5 mg total) by mouth 2 (two) times daily.   atorvastatin 40 MG tablet Commonly known as:  LIPITOR Take 1 tablet (40 mg total) by mouth daily.   CENTRUM SILVER PO Take 1 tablet by mouth daily.   cetirizine 10 MG tablet Commonly known as:  ZYRTEC Take 1 tablet (10 mg total) by mouth daily.   dicyclomine 20 MG tablet Commonly known as:  BENTYL Take 1 tablet (20 mg total) by mouth 2 (two) times daily.   fenofibrate 160 MG tablet Take 1 tablet (160 mg total) by mouth daily.   ferrous sulfate 325 (65 FE) MG tablet Take 1 tablet (325 mg total) by mouth 2 (two) times daily with a meal.   glucose blood test strip Commonly known as:  TRUE METRIX BLOOD GLUCOSE TEST Use as instructed to check blood sugars 3 times per day   hydroxypropyl methylcellulose / hypromellose 2.5 % ophthalmic solution Commonly known as:  ISOPTO TEARS /  GONIOVISC Place 1 drop into both eyes as needed for dry eyes.   Insulin Glargine 100 UNIT/ML Solostar Pen Commonly known as:  LANTUS SOLOSTAR Inject 25 units daily. If sugars are > 130 after 3 days, increase to 28 units. If they remain elevated, increase to 30 units.   Insulin Pen Needle 32G X 4 MM Misc Commonly known as:  TRUEPLUS PEN NEEDLES Use with insulin pen to inject Lantus daily.   Insulin Syringe-Needle U-100 31G X 5/16" 0.3 ML Misc Commonly known as:  TRUEPLUS INSULIN SYRINGE Use to inject insulin daily.   metFORMIN 500 MG tablet Commonly known as:  GLUCOPHAGE Take 2 tablets (1,000 mg total)  by mouth 2 (two) times daily with a meal.   mupirocin ointment 2 % Commonly known as:  BACTROBAN Place 1 application into the nose 2 (two) times daily as needed. What changed:  when to take this   omega-3 acid ethyl esters 1 g capsule Commonly known as:  LOVAZA Take 2 capsules (2 g total) by mouth 2 (two) times daily.   ondansetron 4 MG disintegrating tablet Commonly known as:  ZOFRAN-ODT Take 4 mg by mouth every 8 (eight) hours as needed for nausea or vomiting.   pantoprazole 40 MG tablet Commonly known as:  PROTONIX Take 1 tablet (40 mg total) by mouth daily.   TRUE METRIX METER w/Device Kit Use to monitor blood sugar as directed   TRUEPLUS LANCETS 28G Misc Use to check blood sugar 3 times per day        Allergies:  No Known Allergies   Outstanding Labs/Studies   To follow as OP for CBC/BMET as previously advised in last DC summary  Duration of Discharge Encounter   Greater than 30 minutes including physician time.  Signed, Nedra Hai Dunn PA-C 06/26/2018, 1:18 PM

## 2018-06-26 NOTE — Progress Notes (Signed)
Progress Note  Patient Name: Emily Farmer Date of Encounter: 06/26/2018  Primary Cardiologist:   No primary care provider on file.   Subjective   She feels much better.  Ambulated in room without dizziness.  Feels back to baseline  Inpatient Medications    Scheduled Meds: . amiodarone  200 mg Oral Daily  . apixaban  5 mg Oral BID  . atorvastatin  40 mg Oral Daily  . dicyclomine  20 mg Oral BID  . fenofibrate  160 mg Oral Daily  . ferrous sulfate  325 mg Oral BID WC  . insulin aspart  0-15 Units Subcutaneous TID WC  . insulin glargine  25 Units Subcutaneous Q2200  . loratadine  10 mg Oral Daily  . mupirocin ointment  1 application Nasal BID  . omega-3 acid ethyl esters  2 g Oral BID  . pantoprazole  40 mg Oral Daily   Continuous Infusions:  PRN Meds: acetaminophen, ondansetron (ZOFRAN) IV, ondansetron, polyvinyl alcohol   Vital Signs    Vitals:   06/25/18 1815 06/25/18 2124 06/26/18 0451 06/26/18 0850  BP: 128/62 133/90 120/63 (!) 117/59  Pulse: 65 69 69 73  Resp: (!) 22 19 18    Temp:  98.4 F (36.9 C) 97.9 F (36.6 C) 98.5 F (36.9 C)  TempSrc:  Oral Oral Oral  SpO2: 96% 98% 94% 99%  Weight:   68.5 kg   Height:        Intake/Output Summary (Last 24 hours) at 06/26/2018 1238 Last data filed at 06/25/2018 1755 Gross per 24 hour  Intake 2000 ml  Output -  Net 2000 ml   Filed Weights   06/25/18 1255 06/26/18 0451  Weight: 64 kg 68.5 kg    Telemetry    NSR, PACs - Personally Reviewed  ECG    NA - Personally Reviewed  Physical Exam   GEN: No acute distress.   Neck: No  JVD Cardiac: RRR, no murmurs, rubs, or gallops.  Respiratory: Clear  to auscultation bilaterally. GI: Soft, nontender, non-distended  MS: No  edema; No deformity. Neuro:  Nonfocal  Psych: Normal affect   Labs    Chemistry Recent Labs  Lab 06/23/18 0241 06/25/18 1326 06/26/18 0328  NA 136 134* 138  K 3.7 4.5 3.6  CL 102 103 107  CO2 25 18* 25  GLUCOSE 90 410* 91    BUN 19 35* 23  CREATININE 1.11* 1.60* 0.86  CALCIUM 9.3 8.9 8.7*  PROT 6.5 6.6  --   ALBUMIN 3.5 3.7  --   AST 13* 16  --   ALT 12 12  --   ALKPHOS 50 48  --   BILITOT 0.3 0.5  --   GFRNONAA 54* 34* >60  GFRAA >60 40* >60  ANIONGAP 9 13 6      Hematology Recent Labs  Lab 06/24/18 0740 06/25/18 1326 06/26/18 0328  WBC 6.6 10.2 7.1  RBC 3.76* 3.86* 3.45*  HGB 8.5* 8.8* 8.1*  HCT 28.5* 30.5* 26.4*  MCV 75.8* 79.0* 76.5*  MCH 22.6* 22.8* 23.5*  MCHC 29.8* 28.9* 30.7  RDW 18.1* 18.6* 18.9*  PLT 265 264 227    Cardiac EnzymesNo results for input(s): TROPONINI in the last 168 hours.  Recent Labs  Lab 06/25/18 1329  TROPIPOC 0.00     BNPNo results for input(s): BNP, PROBNP in the last 168 hours.   DDimer No results for input(s): DDIMER in the last 168 hours.   Radiology    Dg Chest 2  View  Result Date: 06/25/2018 CLINICAL DATA:  Shortness of breath beginning this afternoon. EXAM: CHEST - 2 VIEW COMPARISON:  One-view chest x-ray 06/18/2018 FINDINGS: Heart size is exaggerated by low lung volume. There is no edema or effusion. Chronic elevation the right hemidiaphragm is again noted. No focal airspace disease is present. The visualized soft tissues and bony thorax are unremarkable. IMPRESSION: 1. No acute cardiopulmonary disease. 2. Stable chronic changes. Electronically Signed   By: Marin Roberts M.D.   On: 06/25/2018 18:52   Ct Head Wo Contrast  Result Date: 06/25/2018 CLINICAL DATA:  Syncope EXAM: CT HEAD WITHOUT CONTRAST TECHNIQUE: Contiguous axial images were obtained from the base of the skull through the vertex without intravenous contrast. COMPARISON:  06/08/2018. FINDINGS: Brain: There is no evidence for acute hemorrhage, hydrocephalus, mass lesion, or abnormal extra-axial fluid collection. No definite CT evidence for acute infarction. Vascular: No hyperdense vessel or unexpected calcification. Skull: No evidence for fracture. No worrisome lytic or sclerotic  lesion. Sinuses/Orbits: The visualized paranasal sinuses and mastoid air cells are clear. Visualized portions of the globes and intraorbital fat are unremarkable. Other: None. IMPRESSION: Stable.  No acute intracranial abnormality. Electronically Signed   By: Kennith Center M.D.   On: 06/25/2018 18:26    Cardiac Studies   NA  Patient Profile     63 y.o. female with paroxysmal atrial fibrillation, recently hospitalized with gastrointestinal bleeding and recurrent atrial fibrillation with associated diastolic heart failure.  She was discharged a couple of days ago on oral amiodarone and oral diltiazem, now presenting with sudden onset of weakness, lightheadedness, and near syncope.  Felt to be dehydrated on admission.  She was in fib but converted to NSR spontaneously.    Assessment & Plan    ATRIAL FIB:    Her rate was slow.  Amio reduced and her Dilt was stopped.   Continue anticoagulation.    CHRONIC DIASTOLIC HF:  She seems to be euvolemic .  No change in therapy.   DM:   Creat is improved.  OK to resume Glucophage at discharge.  Needs follow up with PCP to get better BP control.     AKI:  Creat is much improved.  Follow as an out patient.     ANEMIA:  Stable.  Follow.     For questions or updates, please contact [facility] Please consult [email or web address] for contact info under Cardiology/STEMI.   Signed, Rollene Rotunda, MD  06/26/2018, 12:38 PM

## 2018-06-28 ENCOUNTER — Telehealth: Payer: Self-pay

## 2018-06-28 NOTE — Telephone Encounter (Signed)
Transition Care Management Follow-up Telephone Call  Date of discharge and from where: 06/26/2018 , [facility]   How have you been since you were released from the hospital? She stated that she is ' doing okay."  Any questions or concerns? She said that she does not have any questions/concerns. No bleeding reported.    Items Reviewed:  Did the pt receive and understand the discharge instructions provided? She said that she has the instructions and has reviewed them and has no questions.   Medications obtained and verified? She has all medications and said that she did not need to review the medication list.  She explained that her blood sugars at home have been running 136-196 and she is taking lantus 28 units daily as noted on AVS instructions. She has not increased to 30 units daily. She stated that she has stopped taking the diltiazem and is taking amiodarone 200 mg daily.   Any new allergies since your discharge?   None reported  Do you have support at home? Yes, lives with her sister  Other (ie: DME, Home Health, etc) She has a glucometer.   Functional Questionnaire: (I = Independent and D = Dependent) ADL's:independent   Follow up appointments reviewed:    PCP Hospital f/u appt confirmed? She was offered an appointment with her PCP for next week but she stated that she preferred to keep the appointment that she already has scheduled with Dr Delford Field for 2/3/202.  This is outside the 14 day TCM period.   Specialist Hospital f/u appt confirmed? Cardiology appointment has been scheduled  Are transportation arrangements needed?yes she is arranging  If their condition worsens, is the pt aware to call  their PCP or go to the ED? yes  Was the patient provided with contact information for the PCP's office or ED? She has the phone number  Was the pt encouraged to call back with questions or concerns? yes

## 2018-06-30 MED FILL — METOPROLOL TARTRATE 25 MG T: 25 | 30 days supply | Qty: 90 | Fill #1

## 2018-06-30 NOTE — Telephone Encounter (Signed)
Note reviewed. You should send to Dr. Delford Field as well as a Lorain Childes

## 2018-07-01 ENCOUNTER — Encounter: Payer: Self-pay | Admitting: Physician Assistant

## 2018-07-01 ENCOUNTER — Ambulatory Visit (INDEPENDENT_AMBULATORY_CARE_PROVIDER_SITE_OTHER): Payer: Self-pay | Admitting: Physician Assistant

## 2018-07-01 VITALS — BP 108/52 | HR 56 | Ht 63.0 in | Wt 152.6 lb

## 2018-07-01 DIAGNOSIS — D649 Anemia, unspecified: Secondary | ICD-10-CM

## 2018-07-01 DIAGNOSIS — E119 Type 2 diabetes mellitus without complications: Secondary | ICD-10-CM

## 2018-07-01 DIAGNOSIS — I480 Paroxysmal atrial fibrillation: Secondary | ICD-10-CM

## 2018-07-01 DIAGNOSIS — E785 Hyperlipidemia, unspecified: Secondary | ICD-10-CM

## 2018-07-01 DIAGNOSIS — K922 Gastrointestinal hemorrhage, unspecified: Secondary | ICD-10-CM

## 2018-07-01 DIAGNOSIS — I10 Essential (primary) hypertension: Secondary | ICD-10-CM

## 2018-07-01 NOTE — Patient Instructions (Addendum)
Medication Instructions:  Your physician recommends that you continue on your current medications as directed. Please refer to the Current Medication list given to you today.  If you need a refill on your cardiac medications before your next appointment, please call your pharmacy.   Lab work: Please have your Cbc repeated next week at your appt with your GI physician  If you have labs (blood work) drawn today and your tests are completely normal, you will receive your results only by: Marland Kitchen MyChart Message (if you have MyChart) OR . A paper copy in the mail If you have any lab test that is abnormal or we need to change your treatment, we will call you to review the results.  Testing/Procedures: None ordered   Follow-Up: At [facility], you and your health needs are our priority.  As part of our continuing mission to provide you with exceptional heart care, we have created designated Provider Care Teams.  These Care Teams include your primary Cardiologist (physician) and Advanced Practice Providers (APPs -  Physician Assistants and Nurse Practitioners) who all work together to provide you with the care you need, when you need it. You will need a follow up appointment in 2 months with Dr.Hilty.

## 2018-07-01 NOTE — Progress Notes (Signed)
Cardiology Office Note    Date:  07/03/2018   ID:  [name], DOB Oct 08, 1955, MRN [id number]  PCP:  Antony Blackbird, MD  Cardiologist:  Dr. Debara Pickett  Chief Complaint  Patient presents with  . Follow-up    seen for Dr. Debara Pickett    History of Present Illness:  Emily Farmer is a 63 y.o. female with PMH of HTN, HLD, DM II, and atrial fibrillation in the setting of acute pancreatitis.  Last echocardiogram obtained on 10/04/2016 showed EF 65 to 70%, grade 2 DD, mild MR, mildly calcified aortic valve, peak PA pressure 30 mmHg, cannot exclude PFO.  She has been followed by Dr. Debara Pickett.  Her last visit with Dr. Debara Pickett was in December 2018, she was noncompliant with medical therapy at the time.  Her lipid profile was uncontrolled with triglycerides remain in the 800 range.  She was also noncompliant with Eliquis at the time.  She was admitted in August 2019 to the hospital with hyperosmolar hyperglycemic nonketotic state after she was unable to afford insulin.  She was also in atrial fibrillation with RVR at the time.  After fluid resuscitation, her atrial fibrillation resolved.  She was followed post hospital by Laroy Apple of atrial fibrillation clinic on 02/04/2018, she was in sinus rhythm at the time.  She returned to the ED on 03/07/2018 with recurrent atrial fibrillation.  She informed the ED physician she was compliant with Eliquis at the time, therefore she underwent DC cardioversion in the emergency room and was subsequently discharged to follow-up with cardiology as outpatient.   Patient was recently admitted in early January 2020 with symptomatic anemia.  Hemoglobin was 7.2 on arrival.  She also admits to have dark stool the week prior to arrival.  While in the emergency room, it was found she was in atrial fibrillation with RVR.  She was able to convert to sinus rhythm after a dose of Lopressor.  Echocardiogram obtained on 06/19/2018 showed EF 55 to 60%, mild MR, PA peak pressure 34 mmHg, small  pericardial effusion.  Eliquis was held and GI service was consulted.  EGD was performed by Dr. Watt Climes on 06/20/2018 which showed several small duodenal ulcer with no stigmata of bleeding, mild inflammation in the stomach, biopsy was taken which did not reveal any H. pylori.  Colonoscopy performed on the following day was normal as well.  After colonoscopy, she did have recurrent atrial fibrillation with RVR and was started on amiodarone.  She was discharged on 200 mg twice daily of amiodarone with plan to convert to 200 mg daily thereafter.  Patient returned to the hospital on 06/25/2018 with low blood pressure and acute kidney injury with creatinine of 1.6.  Her diltiazem was stopped and she was continued on amiodarone only.  Repeat blood work showed normalization of renal function.  Discharge hemoglobin was 8.1.  Patient says that she is scheduled to see her GI doctor on the January 20, I will defer to GI to obtain repeat CBC.  Otherwise she is doing well from cardiology perspective.  She is maintaining sinus rhythm based on today's EKG.  She has no heart failure symptom on physical exam.   Past Medical History:  Diagnosis Date  . Arthritis   . Atrial fibrillation ([place])    a. 09/2016 in setting of pancreatitis;  b. 09/2016 Echo: EF 65-60%, no rwma, Gr2 DD, mild MR, triv TR, PASP 56mHg;  CHA2DS2VASc = 4-->Eliquis 578mBID. c. Recurred 06/2018 in setting of GIB.  . Marland Kitchenhronic  diastolic CHF (congestive heart failure) ([place]) 01/28/2018  . Diabetes mellitus   . GI bleeding 06/2018  . Gout   . Hyperlipidemia   . Hypertension   . Hypertriglyceridemia   . Pancreatitis    a. 09/2016 - Triglycerides 1,392 on admission.    Past Surgical History:  Procedure Laterality Date  . BIOPSY  06/20/2018   Procedure: BIOPSY;  Surgeon: Clarene Essex, MD;  Location: [facility] ENDOSCOPY;  Service: Endoscopy;;  . CHOLECYSTECTOMY N/A 01/04/2014   Procedure: LAPAROSCOPIC CHOLECYSTECTOMY WITH INTRAOPERATIVE CHOLANGIOGRAM;  Surgeon: Ralene Ok, MD;  Location: [place];  Service: General;  Laterality: N/A;  . COLONOSCOPY WITH PROPOFOL N/A 06/21/2018   Procedure: COLONOSCOPY WITH PROPOFOL;  Surgeon: Ronald Lobo, MD;  Location: [place];  Service: Endoscopy;  Laterality: N/A;  . ESOPHAGOGASTRODUODENOSCOPY (EGD) WITH PROPOFOL N/A 06/20/2018   Procedure: ESOPHAGOGASTRODUODENOSCOPY (EGD) WITH PROPOFOL;  Surgeon: Clarene Essex, MD;  Location: [place];  Service: Endoscopy;  Laterality: N/A;  . LUNG SURGERY      Current Medications: Outpatient Medications Prior to Visit  Medication Sig Dispense Refill  . acetaminophen (TYLENOL) 325 MG tablet Take 2 tablets (650 mg total) by mouth every 6 (six) hours as needed for mild pain (or Fever >/= 101).    Marland Kitchen amiodarone (PACERONE) 200 MG tablet Take 1 tablet (200 mg total) by mouth daily. New instructions 30 tablet 1  . apixaban (ELIQUIS) 5 MG TABS tablet Take 1 tablet (5 mg total) by mouth 2 (two) times daily. 60 tablet 11  . atorvastatin (LIPITOR) 40 MG tablet Take 1 tablet (40 mg total) by mouth daily. 90 tablet 3  . Blood Glucose Monitoring Suppl (TRUE METRIX METER) w/Device KIT Use to monitor blood sugar as directed 1 kit 0  . cetirizine (ZYRTEC) 10 MG tablet Take 1 tablet (10 mg total) by mouth daily. 30 tablet 1  . dicyclomine (BENTYL) 20 MG tablet Take 1 tablet (20 mg total) by mouth 2 (two) times daily. 20 tablet 0  . fenofibrate 160 MG tablet Take 1 tablet (160 mg total) by mouth daily. 30 tablet 11  . ferrous sulfate 325 (65 FE) MG tablet Take 1 tablet (325 mg total) by mouth 2 (two) times daily with a meal. 60 tablet 0  . glucose blood (TRUE METRIX BLOOD GLUCOSE TEST) test strip Use as instructed to check blood sugars 3 times per day 100 each 11  . hydroxypropyl methylcellulose / hypromellose (ISOPTO TEARS / GONIOVISC) 2.5 % ophthalmic solution Place 1 drop into both eyes as needed for dry eyes.    . Insulin Glargine (LANTUS SOLOSTAR) 100 UNIT/ML Solostar Pen Inject 25 units  daily. If sugars are > 130 after 3 days, increase to 28 units. If they remain elevated, increase to 30 units. 15 mL 2  . Insulin Pen Needle (TRUEPLUS PEN NEEDLES) 32G X 4 MM MISC Use with insulin pen to inject Lantus daily. 100 each 11  . Insulin Syringe-Needle U-100 (TRUEPLUS INSULIN SYRINGE) 31G X 5/16" 0.3 ML MISC Use to inject insulin daily. 100 each 11  . metFORMIN (GLUCOPHAGE) 500 MG tablet Take 2 tablets (1,000 mg total) by mouth 2 (two) times daily with a meal. 120 tablet 0  . Multiple Vitamins-Minerals (CENTRUM SILVER PO) Take 1 tablet by mouth daily.    . mupirocin ointment (BACTROBAN) 2 % Place 1 application into the nose 2 (two) times daily as needed. (Patient taking differently: Place 1 application into the nose 2 (two) times daily. )    . omega-3 acid  ethyl esters (LOVAZA) 1 g capsule Take 2 capsules (2 g total) by mouth 2 (two) times daily. 120 capsule 0  . ondansetron (ZOFRAN-ODT) 4 MG disintegrating tablet Take 4 mg by mouth every 8 (eight) hours as needed for nausea or vomiting.    . pantoprazole (PROTONIX) 40 MG tablet Take 1 tablet (40 mg total) by mouth daily. 30 tablet 0  . TRUEPLUS LANCETS 28G MISC Use to check blood sugar 3 times per day 100 each 11   No facility-administered medications prior to visit.      Allergies:   Patient has no known allergies.   Social History   Socioeconomic History  . Marital status: Single    Spouse name: Not on file  . Number of children: Not on file  . Years of education: Not on file  . Highest education level: Not on file  Occupational History  . Not on file  Social Needs  . Financial resource strain: Not on file  . Food insecurity:    Worry: Not on file    Inability: Not on file  . Transportation needs:    Medical: Not on file    Non-medical: Not on file  Tobacco Use  . Smoking status: Never Smoker  . Smokeless tobacco: Never Used  Substance and Sexual Activity  . Alcohol use: No  . Drug use: No  . Sexual activity: Not  Currently  Lifestyle  . Physical activity:    Days per week: Not on file    Minutes per session: Not on file  . Stress: Not on file  Relationships  . Social connections:    Talks on phone: Not on file    Gets together: Not on file    Attends religious service: Not on file    Active member of club or organization: Not on file    Attends meetings of clubs or organizations: Not on file    Relationship status: Not on file  Other Topics Concern  . Not on file  Social History Narrative   Lives with her sister and does not need any assistance with ADLs.       Family History:  The patient's family history includes Diabetes in her sister; Heart attack in her father and mother.   ROS:   Please see the history of present illness.    ROS All other systems reviewed and are negative.   PHYSICAL EXAM:   VS:  BP (!) 108/52   Pulse (!) 56   Ht '5\' 3"'  (1.6 m)   Wt 152 lb 9.6 oz (69.2 kg)   BMI 27.03 kg/m    GEN: Well nourished, well developed, in no acute distress  HEENT: normal  Neck: no JVD, carotid bruits, or masses Cardiac: RRR; no murmurs, rubs, or gallops,no edema  Respiratory:  clear to auscultation bilaterally, normal work of breathing GI: soft, nontender, nondistended, + BS MS: no deformity or atrophy  Skin: warm and dry, no rash Neuro:  Alert and Oriented x 3, Strength and sensation are intact Psych: euthymic mood, full affect  Wt Readings from Last 3 Encounters:  07/01/18 152 lb 9.6 oz (69.2 kg)  06/26/18 151 lb (68.5 kg)  06/21/18 141 lb (64 kg)      Studies/Labs Reviewed:   EKG:  EKG is ordered today.  The ekg ordered today demonstrates sinus bradycardia  Recent Labs: 06/24/2018: Magnesium 1.8 06/25/2018: ALT 12 06/26/2018: BUN 23; Creatinine, Ser 0.86; Hemoglobin 8.1; Platelets 227; Potassium 3.6; Sodium 138; TSH 1.554  Lipid Panel    Component Value Date/Time   CHOL 187 03/22/2018 1053   TRIG 163 (H) 03/22/2018 1053   HDL 49 03/22/2018 1053   CHOLHDL 3.8  03/22/2018 1053   CHOLHDL 8.8 (H) 11/21/2016 0822   VLDL NOT CALC 11/21/2016 0822   LDLCALC 105 (H) 03/22/2018 1053   LDLDIRECT 211 (H) 05/04/2017 0842   LDLDIRECT 170 (H) 11/21/2016 1610    Additional studies/ records that were reviewed today include:   Echo 06/19/2018 LV EF: 55% -   60% Study Conclusions  - Left ventricle: The cavity size was normal. Wall thickness was   normal. Systolic function was normal. The estimated ejection   fraction was in the range of 55% to 60%. Wall motion was normal;   there were no regional wall motion abnormalities. The study is   not technically sufficient to allow evaluation of LV diastolic   function. - Aortic valve: Trileaflet; mildly thickened, mildly calcified   leaflets. - Mitral valve: There was mild regurgitation. - Right atrium: The atrium was mildly dilated. - Pulmonary arteries: Systolic pressure was mildly increased. PA   peak pressure: 34 mm Hg (S). - Pericardium, extracardiac: A small pericardial effusion was   identified. There was no evidence of hemodynamic compromise.    ASSESSMENT:    1. Paroxysmal A-fib ([place])   2. Essential hypertension   3. Hyperlipidemia, unspecified hyperlipidemia type   4. Controlled type 2 diabetes mellitus without complication, without long-term current use of insulin ([place])   5. Gastrointestinal hemorrhage, unspecified gastrointestinal hemorrhage type   6. Anemia, unspecified type      PLAN:  In order of problems listed above:  1. Paroxysmal atrial fibrillation: Patient has been restarted on Eliquis after the recent GI bleed and a GI work-up.  She is no longer on diltiazem.  Heart rate is bradycardic on amiodarone.  She is maintaining sinus rhythm  2. Retention: Blood pressure stable on current therapy  3. Hyperlipidemia: On Lipitor 40 mg daily.  4. DM2: Managed by primary care provider, on insulin  5. Recent symptomatic anemia due to GI bleed: She underwent endoscopy and colonoscopy by GI  physician.  She is due to follow-up with GI service next week.  I will defer repeat CBC lab work to GI service.    Medication Adjustments/Labs and Tests Ordered: Current medicines are reviewed at length with the patient today.  Concerns regarding medicines are outlined above.  Medication changes, Labs and Tests ordered today are listed in the Patient Instructions below. Patient Instructions  Medication Instructions:  Your physician recommends that you continue on your current medications as directed. Please refer to the Current Medication list given to you today.  If you need a refill on your cardiac medications before your next appointment, please call your pharmacy.   Lab work: Please have your Cbc repeated next week at your appt with your GI physician  If you have labs (blood work) drawn today and your tests are completely normal, you will receive your results only by: Marland Kitchen MyChart Message (if you have MyChart) OR . A paper copy in the mail If you have any lab test that is abnormal or we need to change your treatment, we will call you to review the results.  Testing/Procedures: None ordered   Follow-Up: At [facility], you and your health needs are our priority.  As part of our continuing mission to provide you with exceptional heart care, we have created designated Provider Care Teams.  These Care  Teams include your primary Cardiologist (physician) and Advanced Practice Providers (APPs -  Physician Assistants and Nurse Practitioners) who all work together to provide you with the care you need, when you need it. You will need a follow up appointment in 2 months with Dr.Hilty.      Hilbert Corrigan, [place]  07/03/2018 11:46 PM    [facility] [place], [place], [place]  [address] Phone: [phone]; Fax: [phone]

## 2018-07-03 ENCOUNTER — Encounter: Payer: Self-pay | Admitting: Physician Assistant

## 2018-07-19 ENCOUNTER — Encounter: Payer: Self-pay | Admitting: Critical Care Medicine

## 2018-07-19 ENCOUNTER — Ambulatory Visit: Payer: Self-pay | Attending: Critical Care Medicine | Admitting: Critical Care Medicine

## 2018-07-19 VITALS — BP 130/69 | HR 61 | Temp 98.1°F | Ht 63.0 in | Wt 158.0 lb

## 2018-07-19 DIAGNOSIS — F329 Major depressive disorder, single episode, unspecified: Secondary | ICD-10-CM

## 2018-07-19 DIAGNOSIS — R112 Nausea with vomiting, unspecified: Secondary | ICD-10-CM

## 2018-07-19 DIAGNOSIS — I482 Chronic atrial fibrillation, unspecified: Secondary | ICD-10-CM | POA: Insufficient documentation

## 2018-07-19 DIAGNOSIS — R55 Syncope and collapse: Secondary | ICD-10-CM

## 2018-07-19 DIAGNOSIS — Z79899 Other long term (current) drug therapy: Secondary | ICD-10-CM | POA: Insufficient documentation

## 2018-07-19 DIAGNOSIS — E781 Pure hyperglyceridemia: Secondary | ICD-10-CM

## 2018-07-19 DIAGNOSIS — K269 Duodenal ulcer, unspecified as acute or chronic, without hemorrhage or perforation: Secondary | ICD-10-CM

## 2018-07-19 DIAGNOSIS — K2980 Duodenitis without bleeding: Secondary | ICD-10-CM | POA: Insufficient documentation

## 2018-07-19 DIAGNOSIS — I4891 Unspecified atrial fibrillation: Secondary | ICD-10-CM

## 2018-07-19 DIAGNOSIS — D500 Iron deficiency anemia secondary to blood loss (chronic): Secondary | ICD-10-CM | POA: Insufficient documentation

## 2018-07-19 DIAGNOSIS — Z8249 Family history of ischemic heart disease and other diseases of the circulatory system: Secondary | ICD-10-CM | POA: Insufficient documentation

## 2018-07-19 DIAGNOSIS — K2950 Unspecified chronic gastritis without bleeding: Secondary | ICD-10-CM | POA: Insufficient documentation

## 2018-07-19 DIAGNOSIS — B351 Tinea unguium: Secondary | ICD-10-CM

## 2018-07-19 DIAGNOSIS — E871 Hypo-osmolality and hyponatremia: Secondary | ICD-10-CM

## 2018-07-19 DIAGNOSIS — Z6828 Body mass index (BMI) 28.0-28.9, adult: Secondary | ICD-10-CM | POA: Insufficient documentation

## 2018-07-19 DIAGNOSIS — K2990 Gastroduodenitis, unspecified, without bleeding: Secondary | ICD-10-CM

## 2018-07-19 DIAGNOSIS — E876 Hypokalemia: Secondary | ICD-10-CM

## 2018-07-19 DIAGNOSIS — E669 Obesity, unspecified: Secondary | ICD-10-CM

## 2018-07-19 DIAGNOSIS — Z794 Long term (current) use of insulin: Secondary | ICD-10-CM | POA: Insufficient documentation

## 2018-07-19 DIAGNOSIS — R001 Bradycardia, unspecified: Secondary | ICD-10-CM

## 2018-07-19 DIAGNOSIS — N179 Acute kidney failure, unspecified: Secondary | ICD-10-CM

## 2018-07-19 DIAGNOSIS — M1990 Unspecified osteoarthritis, unspecified site: Secondary | ICD-10-CM | POA: Insufficient documentation

## 2018-07-19 DIAGNOSIS — D649 Anemia, unspecified: Secondary | ICD-10-CM

## 2018-07-19 DIAGNOSIS — I10 Essential (primary) hypertension: Secondary | ICD-10-CM

## 2018-07-19 DIAGNOSIS — I110 Hypertensive heart disease with heart failure: Secondary | ICD-10-CM | POA: Insufficient documentation

## 2018-07-19 DIAGNOSIS — E785 Hyperlipidemia, unspecified: Secondary | ICD-10-CM | POA: Insufficient documentation

## 2018-07-19 DIAGNOSIS — Z833 Family history of diabetes mellitus: Secondary | ICD-10-CM | POA: Insufficient documentation

## 2018-07-19 DIAGNOSIS — E1165 Type 2 diabetes mellitus with hyperglycemia: Secondary | ICD-10-CM | POA: Insufficient documentation

## 2018-07-19 DIAGNOSIS — E1169 Type 2 diabetes mellitus with other specified complication: Secondary | ICD-10-CM | POA: Insufficient documentation

## 2018-07-19 DIAGNOSIS — Z1231 Encounter for screening mammogram for malignant neoplasm of breast: Secondary | ICD-10-CM

## 2018-07-19 DIAGNOSIS — I480 Paroxysmal atrial fibrillation: Secondary | ICD-10-CM

## 2018-07-19 DIAGNOSIS — Z7901 Long term (current) use of anticoagulants: Secondary | ICD-10-CM | POA: Insufficient documentation

## 2018-07-19 DIAGNOSIS — I5032 Chronic diastolic (congestive) heart failure: Secondary | ICD-10-CM

## 2018-07-19 DIAGNOSIS — F32A Depression, unspecified: Secondary | ICD-10-CM | POA: Insufficient documentation

## 2018-07-19 DIAGNOSIS — Z1159 Encounter for screening for other viral diseases: Secondary | ICD-10-CM

## 2018-07-19 LAB — GLUCOSE, POCT (MANUAL RESULT ENTRY): POC Glucose: 214 mg/dl — AB (ref 70–99)

## 2018-07-19 MED ORDER — OMEGA-3-ACID ETHYL ESTERS 1 G PO CAPS: 2.0000 g | ORAL_CAPSULE | Freq: Two times a day (BID) | ORAL | 2 refills | Status: DC

## 2018-07-19 MED ORDER — METFORMIN HCL 500 MG PO TABS: 1000.0000 mg | ORAL_TABLET | Freq: Two times a day (BID) | ORAL | 2 refills | Status: DC

## 2018-07-19 MED ORDER — AMIODARONE HCL 200 MG PO TABS: 200.0000 mg | ORAL_TABLET | Freq: Every day | ORAL | 2 refills | Status: DC

## 2018-07-19 MED ORDER — MUPIROCIN 2 % EX OINT: 1.0000 "application " | TOPICAL_OINTMENT | Freq: Two times a day (BID) | CUTANEOUS | 0 refills | Status: DC

## 2018-07-19 MED ORDER — FERROUS SULFATE 325 (65 FE) MG PO TABS: 325.0000 mg | ORAL_TABLET | Freq: Two times a day (BID) | ORAL | 2 refills | Status: DC

## 2018-07-19 MED ORDER — DICYCLOMINE HCL 20 MG PO TABS: 20.0000 mg | ORAL_TABLET | Freq: Two times a day (BID) | ORAL | 0 refills | Status: DC | PRN

## 2018-07-19 MED ORDER — CETIRIZINE HCL 10 MG PO TABS: 10.0000 mg | ORAL_TABLET | Freq: Every day | ORAL | 2 refills | Status: DC

## 2018-07-19 MED ORDER — INSULIN GLARGINE 100 UNIT/ML SOLOSTAR PEN: 30.0000 [IU] | PEN_INJECTOR | Freq: Every day | SUBCUTANEOUS | 4 refills | Status: DC

## 2018-07-19 MED ORDER — PANTOPRAZOLE SODIUM 40 MG PO TBEC: 40.0000 mg | DELAYED_RELEASE_TABLET | Freq: Every day | ORAL | 2 refills | Status: DC

## 2018-07-19 MED FILL — !LANTUS SOLOSTAR 100UNITS/M: 100 | 30 days supply | Qty: 9 | Fill #0

## 2018-07-19 MED FILL — MUPIROCIN 2% OINTMENT: 2 | 15 days supply | Qty: 22 | Fill #0

## 2018-07-19 MED FILL — DICYCLOMINE 20 MG TABLET: 20 | 20 days supply | Qty: 40 | Fill #0

## 2018-07-19 MED FILL — FERROUS SULFATE 325 MG TAB: 325 (65 FE) | 30 days supply | Qty: 60 | Fill #0

## 2018-07-19 MED FILL — metFORMIN HCL 500 MG TABS: 500 | 30 days supply | Qty: 120 | Fill #0

## 2018-07-19 NOTE — Assessment & Plan Note (Signed)
Near syncope secondary to slow ventricular response now improved

## 2018-07-19 NOTE — Assessment & Plan Note (Signed)
Chronic diastolic heart failure which is well compensated at this time without diuretics

## 2018-07-19 NOTE — Assessment & Plan Note (Signed)
Iron deficiency anemia from chronic blood loss secondary to chronic gastritis and duodenal shallow ulcers  The patient is tolerating Eliquis well and will continue Protonix daily

## 2018-07-19 NOTE — Assessment & Plan Note (Signed)
Essential hypertension with good control on current medication profile of amiodarone alone  Note no other medications being prescribed at this time for hypertension

## 2018-07-19 NOTE — Assessment & Plan Note (Signed)
Will monitor sodium level with a metabolic panel today

## 2018-07-19 NOTE — Assessment & Plan Note (Signed)
History of hypertriglyceridemia which resulted in pancreatitis now well controlled

## 2018-07-19 NOTE — Patient Instructions (Signed)
All of your medications were sent to our pharmacy for a refill  No changes in medication dosing at this time, note your Lantus dose is 30 units nightly  Continue metformin 1000 mg twice daily  No change in your cardiology heart medications   Continue to take the Eliquis 1 tablet twice daily  Please stay off aspirin at this time  A referral to podiatry a foot doctor was made for your toenails  Would like for you to see our clinical pharmacist for a diabetic education session and would also like for you to visit with Jasmine our clinical social worker who can speak with you regarding your stress and depression score which does plan to the management of your blood pressure and diabetes  Return to see Dr. Jillyn Hidden in 1 month  Labs today will include a metabolic panel magnesium blood count and hepatitis C screen we will call with results  We also have obtain for you a mammogram for screening

## 2018-07-19 NOTE — Assessment & Plan Note (Signed)
Recent hypokalemia now resolved

## 2018-07-19 NOTE — Assessment & Plan Note (Signed)
History of atrial fibrillation with paroxysmal rapid response Note more recently the patient's had a slow ventricular response and had to stop the diltiazem he was already off the metoprolol  The patient currently is on the amiodarone 200 mg daily and this is achieved sinus rhythm  Patient appears to be in sinus rhythm today  We will continue Eliquis and amiodarone as prescribed  Note  aspirin has been discontinued because of gastritis

## 2018-07-19 NOTE — Assessment & Plan Note (Signed)
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

## 2018-07-19 NOTE — Assessment & Plan Note (Signed)
History of gastritis and duodenitis with shallow duodenal ulcers  Continue Protonix and follow-up with gastroenterology

## 2018-07-19 NOTE — Progress Notes (Signed)
Subjective:    Patient ID: Emily Farmer, female    DOB: 1956/06/16, 63 y.o.   MRN: [id number]  [place]  Date of Telephone Encounter:06/28/18  Date of 1st service:07/19/2018  [place]  This is a 63 year old female with type 2 diabetes and hypertension along with atrial fibrillation.  The patient had paroxysmal atrial fibrillation was admitted from between the third and 9 January.  The patient was found to have syncope due to severe iron deficiency and low hemoglobin.  The patient was given 2 units of transfusions.  The patient had heart rate controlled with diltiazem and amiodarone.  The patient was discharged on 9 January only to return on 11 January with bradycardia with slow ventricular response with atrial fibrillation.  During that visit the diltiazem was stopped and amiodarone was reduced in dosage.  The patient was noted to have a hemoglobin A1c of 9.8 during these admissions.  The patient's insulin dose was adjusted with Lantus being increased.  The patient has resumed metformin.  Currently the patient states she is not short of breath, there is no chest pain.  There is no palpitations.  There is minimal edema in the feet.  The patient has no abdominal pain.  There is no nausea or vomiting.  Patient's a.m. blood sugars were in the 1 40-1 50 range and postprandial go to 210-220.  Admit and DischargeDate:06/18/18 - 06/24/18   06/26/18- 06/28/18    PCP: Chapman Fitch   See below the discharge summary excerpts from the patient's previous admissions   Brief Summary: Emily Farmer a 63 y.o.femalewith medical history significant forparoxysmal A. fib on Eliquis and aspirin 81 mg daily PTA, chronic diastolic CHF, type 2 diabetes/IDDM, hyperlipidemia who presented to [facility] ED via EMS with complaints of shortness of breath and lightheadedness of a few hours duration. Admits to occasional dark stool but states that she takes iron supplement. First colonoscopy was a year  ago,seen by [facility].   In the ED presented with A. fib RVR. Was given a dose of Lopressor with improvement but recurrence. Cardiology consulted.Lowhemoglobin 7.2 with baseline hemoglobin of 10. Negative FOBT. Lab studies remarkable for profound iron deficiency. Transfused 2 unit PRBCs and consulted GI for endoscopy. Iron supplement held until after endoscopies. EGD done on 06/20/2018 revealed chronic gastritis biopsied to rule out H. Pylori, a few non-bleeding small cratered and superficial duodenal ulcers with no stigmata of bleeding found in the duodenal bulb.   Underwent colonoscopy 06/21/2018 which did not reveal source of iron deficiency anemia.   Assessment/Plan:  Symptomatic anemia/severe iron deficiency anemia possibly secondary to chronic gastritis, nonbleeding duodenal ulcers Presented with hemoglobin of 7.2 with baseline hemoglobin of 10.2 in 02/22/2018 FOBT negative Profound iron deficiency from iron studies: Iron 13, TIBC 522, saturation ratio 2, ferritin 4, folate 42.4 and B12: 610. Restarted ferrous sulfate 325 milligrams twice daily Hg stable post 2 unit PRBC transfusion EGD done 06/20/2018 which revealed chronic gastritis which was biopsied and pathology negative for H. pylori. Also few scattered nonbleeding duodenal ulcers. GI recommended Protonix p.o. 40 mg daily.  Duration of PPI to be determined by GI during outpatient follow-up.  Aspirin now discontinued indefinitely as per cardiology recommendations.  Patient however remains on Eliquis. Colonoscopy negative for any active bleeding. Outpatient follow-up with GI recommended in 4 weeks. Follow CBCs closely as outpatient and repeat iron studies in 4 to 6 weeks.  Chronic A. fib with RVR Presented with dyspnea with minimal movement Initially on Cardizem drip and  then transitioned to oral Cardizem.  Home metoprolol discontinued. Continue diltiazem CD 240 mg daily as recommended by cardiology Started on IV amiodarone  infusion by cardiology which as per the recommendation has been changed to 200 mg twice daily for 1 week followed by 200 mg once daily thereafter. Eliquis Resumed.  Aspirin 81 mg discontinued indefinitely. Currently in sinus rhythm.  Outpatient follow-up with cardiology has been arranged. As per cardiology, patient will need a work excuse letter until she returns to their office for follow-up.  Hypokalemia/hypomagnesemia Replaced.  Chronic diastolic CHF 2D echo done on 06/19/2018 revealed preserved LVEF 55 to 60% with wall motion normal Clinically euvolemic.  Not on diuretics.  Type 2 diabetes Last A1c done in November 2019 revealed A1c of 9.8 indicating poor glycemic control. In the hospital she was placed on reduced dose of Lantus and SSI.  Resume prior home dose of Lantus and metformin at discharge. Recommend close outpatient follow-up, adjustment of medications for better DM control.  Hyperlipidemia LDL 105 on 03/22/2018 Continue Lipitor and fenofibrate  Admit date: 06/25/2018 Discharge date: 06/26/2018  Primary Care Provider: Antony Blackbird Primary Cardiologist: No primary care provider on file. Primary Electrophysiologist:  None  Discharge Diagnoses   Principal Problem:   Atrial fibrillation with slow ventricular response (HCC) Active Problems:   Essential hypertension   Chronic diastolic CHF (congestive heart failure) (HCC)   Symptomatic bradycardia   AKI (acute kidney injury) ([place])  _     History of Present Illness    Emily Farmer is a 63 y.o. female with history of paroxysmal atrial fibrillation, HTN, HLD, chronic diastolic CHF, arthritis, pancreatitis, hypertriglyceridemia and recent GIB. She was recently hospitalized January 3 through June 24, 2018 with symptomatic anemia and atrial fibrillation. Her hemoglobin had dropped to 7.2, down from baseline around 10. FOBT was negative. Lab studies were remarkable for profound iron deficiency. She was transfused 2  unit PRBCs and consulted GI for endoscopy. EGD done on 06/20/2018 revealed chronic gastritis biopsied to rule out H. Pylori, a few non-bleeding small cratered and superficial duodenal ulcers with no stigmata of bleeding found in the duodenal bulb.She underwent colonoscopy 06/21/2018 which did not reveal source of iron deficiency anemia. PPI was started, duration to be determined by GI. Aspirin was discontinued indefinitely. During admission she had been started on IV amiodarone. Going home her metoprolol was discontinued, and she was discharged on amiodarone and diltiazem. 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 Her hemoglobin was felt compatible with previous in the 8 range.  Hospital Course   She was admitted overnight for further management. Her diltiazem was discontinued and amiodarone was reduced to 250m daily. Metformin was held and she was given gentle IV hydration. This morning she feels much better, and creatinine improved. Dr. HPercival Spanishhas seen and examined the patient today and feels she is stable for discharge. She will keep previously arranged f/u 1/16/   D/C call was made 06/28/18;   Other (ie: DME, Home Health, etc) She has a glucometer.   Functional Questionnaire: (I = Independent and D = Dependent) ADL's:independent   Follow up appointments reviewed:    PCP  Hospital f/u appt confirmed? She was offered an appointment with her PCP for next week but she stated that she preferred to keep the appointment that she already has scheduled with Dr Joya Gaskins for 2/3/202.  This is outside the 14 day TCM  period.   [facility] f/u appt confirmed? Cardiology appointment has been scheduled  Are transportation arrangements needed?yes she is arranging  If their condition worsens, is the pt aware to call  their PCP or go to the ED? yes  Was the patient provided with contact information for the PCP's office or ED? She has the phone number  Was the pt encouraged to call back with questions or concerns? Yes    Past Medical History:  Diagnosis Date  . Arthritis   . Atrial fibrillation ([place])    a. 09/2016 in setting of pancreatitis;  b. 09/2016 Echo: EF 65-60%, no rwma, Gr2 DD, mild MR, triv TR, PASP 54mHg;  CHA2DS2VASc = 4-->Eliquis 572mBID. c. Recurred 06/2018 in setting of GIB.  . Marland Kitchenhronic diastolic CHF (congestive heart failure) ([place]/15/2019  . Diabetes mellitus   . GI bleeding 06/2018  . Gout   . Hyperlipidemia   . Hypertension   . Hypertriglyceridemia   . Pancreatitis    a. 09/2016 - Triglycerides 1,392 on admission.     Family History  Problem Relation Age of Onset  . Heart attack Mother   . Heart attack Father   . Diabetes Sister   . High blood pressure Neg Hx   . High Cholesterol Neg Hx      Social History   Socioeconomic History  . Marital status: Single    Spouse name: Not on file  . Number of children: Not on file  . Years of education: Not on file  . Highest education level: Not on file  Occupational History  . Not on file  Social Needs  . Financial resource strain: Not on file  . Food insecurity:    Worry: Not on file    Inability: Not on file  . Transportation needs:    Medical: Not on file    Non-medical: Not on file  Tobacco Use  . Smoking status: Never Smoker  . Smokeless tobacco: Never Used  Substance and Sexual Activity  . Alcohol use: No  . Drug use: No  . Sexual activity: Not Currently  Lifestyle  . Physical activity:    Days per week: Not on file    Minutes per session: Not on file  . Stress: Not on file  Relationships  . Social  connections:    Talks on phone: Not on file    Gets together: Not on file    Attends religious service: Not on file    Active member of club or organization: Not on file    Attends meetings of clubs or organizations: Not on file    Relationship status: Not on file  . Intimate partner violence:    Fear of current or ex partner: Not on file    Emotionally abused: Not on file    Physically abused: Not on file    Forced sexual activity: Not on file  Other Topics Concern  . Not on file  Social History Narrative   Lives with her sister and does not need any assistance with ADLs.       No Known Allergies   Outpatient Medications Prior to Visit  Medication Sig Dispense Refill  . acetaminophen (TYLENOL) 325 MG tablet Take 2 tablets (650  mg total) by mouth every 6 (six) hours as needed for mild pain (or Fever >/= 101).    Marland Kitchen apixaban (ELIQUIS) 5 MG TABS tablet Take 1 tablet (5 mg total) by mouth 2 (two) times daily. 60 tablet 11  . atorvastatin (LIPITOR) 40 MG tablet Take 1 tablet (40 mg total) by mouth daily. 90 tablet 3  . Blood Glucose Monitoring Suppl (TRUE METRIX METER) w/Device KIT Use to monitor blood sugar as directed 1 kit 0  . fenofibrate 160 MG tablet Take 1 tablet (160 mg total) by mouth daily. 30 tablet 11  . glucose blood (TRUE METRIX BLOOD GLUCOSE TEST) test strip Use as instructed to check blood sugars 3 times per day 100 each 11  . hydroxypropyl methylcellulose / hypromellose (ISOPTO TEARS / GONIOVISC) 2.5 % ophthalmic solution Place 1 drop into both eyes as needed for dry eyes.    . Insulin Pen Needle (TRUEPLUS PEN NEEDLES) 32G X 4 MM MISC Use with insulin pen to inject Lantus daily. 100 each 11  . Insulin Syringe-Needle U-100 (TRUEPLUS INSULIN SYRINGE) 31G X 5/16" 0.3 ML MISC Use to inject insulin daily. 100 each 11  . Multiple Vitamins-Minerals (CENTRUM SILVER PO) Take 1 tablet by mouth daily.    . ondansetron (ZOFRAN-ODT) 4 MG disintegrating tablet Take 4 mg by mouth every 8  (eight) hours as needed for nausea or vomiting.    . TRUEPLUS LANCETS 28G MISC Use to check blood sugar 3 times per day 100 each 11  . amiodarone (PACERONE) 200 MG tablet Take 1 tablet (200 mg total) by mouth daily. New instructions 30 tablet 1  . cetirizine (ZYRTEC) 10 MG tablet Take 1 tablet (10 mg total) by mouth daily. 30 tablet 1  . dicyclomine (BENTYL) 20 MG tablet Take 1 tablet (20 mg total) by mouth 2 (two) times daily. 20 tablet 0  . ferrous sulfate 325 (65 FE) MG tablet Take 1 tablet (325 mg total) by mouth 2 (two) times daily with a meal. 60 tablet 0  . Insulin Glargine (LANTUS SOLOSTAR) 100 UNIT/ML Solostar Pen Inject 25 units daily. If sugars are > 130 after 3 days, increase to 28 units. If they remain elevated, increase to 30 units. (Patient taking differently: Inject 30 Units into the skin at bedtime. ) 15 mL 2  . metFORMIN (GLUCOPHAGE) 500 MG tablet Take 2 tablets (1,000 mg total) by mouth 2 (two) times daily with a meal. 120 tablet 0  . mupirocin ointment (BACTROBAN) 2 % Place 1 application into the nose 2 (two) times daily as needed. (Patient taking differently: Place 1 application into the nose 2 (two) times daily. )    . omega-3 acid ethyl esters (LOVAZA) 1 g capsule Take 2 capsules (2 g total) by mouth 2 (two) times daily. 120 capsule 0  . pantoprazole (PROTONIX) 40 MG tablet Take 1 tablet (40 mg total) by mouth daily. 30 tablet 0   No facility-administered medications prior to visit.       Review of Systems  Constitutional: Positive for fatigue. Negative for appetite change and diaphoresis.  HENT: Positive for rhinorrhea. Negative for trouble swallowing.   Respiratory: Positive for cough. Negative for chest tightness, shortness of breath and wheezing.   Cardiovascular: Negative for chest pain, palpitations and leg swelling.  Gastrointestinal: Negative for abdominal distention, abdominal pain, blood in stool, diarrhea, nausea and vomiting.       Heartburn is better     Endocrine: Negative for polydipsia, polyphagia and polyuria.  Genitourinary: Negative  for dysuria and frequency.  Neurological: Positive for dizziness, weakness, light-headedness and numbness. Negative for tremors, seizures, syncope and headaches.       Numb in fingers  Hematological: Negative.   Psychiatric/Behavioral: Positive for sleep disturbance. Negative for self-injury and suicidal ideas. The patient is not nervous/anxious.        Objective:   Physical Exam Vitals:   07/19/18 0835  BP: 130/69  Pulse: 61  Temp: 98.1 F (36.7 C)  TempSrc: Oral  SpO2: 98%  Weight: 158 lb (71.7 kg)  Height: '5\' 3"'  (1.6 m)    Gen: Pleasant, well-nourished, in no distress,  normal affect  ENT: No lesions,  mouth clear,  oropharynx clear, no postnasal drip  Neck: No JVD, no TMG, no carotid bruits  Lungs: No use of accessory muscles, no dullness to percussion, clear without rales or rhonchi  Cardiovascular: RRR, heart sounds normal, no murmur or gallops, no peripheral edema  Abdomen: soft and NT, no HSM,  BS normal  Musculoskeletal: No deformities, no cyanosis or clubbing  Neuro: alert, non focal  Skin: Warm, no lesions or rashes  Foot exam normal except mild toenail fungus . No ulcers.  Sensation intact  No results found.  BMP Latest Ref Rng & Units 06/26/2018 06/25/2018 06/23/2018  Glucose 70 - 99 mg/dL 91 410(H) 90  BUN 8 - 23 mg/dL 23 35(H) 19  Creatinine 0.44 - 1.00 mg/dL 0.86 1.60(H) 1.11(H)  BUN/Creat Ratio 12 - 28 - - -  Sodium 135 - 145 mmol/L 138 134(L) 136  Potassium 3.5 - 5.1 mmol/L 3.6 4.5 3.7  Chloride 98 - 111 mmol/L 107 103 102  CO2 22 - 32 mmol/L 25 18(L) 25  Calcium 8.9 - 10.3 mg/dL 8.7(L) 8.9 9.3   CBC Latest Ref Rng & Units 06/26/2018 06/25/2018 06/24/2018  WBC 4.0 - 10.5 K/uL 7.1 10.2 6.6  Hemoglobin 12.0 - 15.0 g/dL 8.1(L) 8.8(L) 8.5(L)  Hematocrit 36.0 - 46.0 % 26.4(L) 30.5(L) 28.5(L)  Platelets 150 - 400 K/uL 227 264 265   ABI 07/19/2018  L and R leg  1.34  TSH 1.554  Procedures:  EGD 06/20/2018: Impression: - ?tiny hiatal hernia. - Chronic gastritis. Biopsied. To rule out H. pylori - Few small non-bleeding duodenal ulcers with no stigmata of bleeding. - Normal second portion of the duodenum. - The examination was otherwise normal.   Colonoscopy 06/21/2018: Impression: - The entire examined colon is normal. No source of iron deficiency anemia seen. - The examined portion of the ileum was normal. - The distal rectum and anal verge are normal on retroflexion view. - No specimens collected.   Pathology: Diagnosis Stomach, biopsy - REACTIVE GASTROPATHY - NO H. PYLORI OR INTESTINAL METAPLASIA IDENTIFIED - SEE COMMENT   TTE 06/19/2018 Study Conclusions  - Left ventricle: The cavity size was normal. Wall thickness was normal. Systolic function was normal. The estimated ejection fraction was in the range of 55% to 60%. Wall motion was normal; there were no regional wall motion abnormalities. The study is not technically sufficient to allow evaluation of LV diastolic function. - Aortic valve: Trileaflet; mildly thickened, mildly calcified leaflets. - Mitral valve: There was mild regurgitation. - Right atrium: The atrium was mildly dilated. - Pulmonary arteries: Systolic pressure was mildly increased. PA peak pressure: 34 mm Hg (S). - Pericardium, extracardiac: A small pericardial effusion was identified. There was no evidence of hemodynamic compromise         Assessment & Plan:  I personally reviewed all images and lab  data in the [facility] system as well as any outside material available during this office visit and agree with the  radiology impressions.   Essential hypertension Essential hypertension with good control on current medication profile of amiodarone alone  Note no other medications being prescribed at this time for hypertension  Atrial fibrillation with slow ventricular response  (HCC) History of atrial fibrillation with paroxysmal rapid response Note more recently the patient's had a slow ventricular response and had to stop the diltiazem he was already off the metoprolol  The patient currently is on the amiodarone 200 mg daily and this is achieved sinus rhythm  Patient appears to be in sinus rhythm today  We will continue Eliquis and amiodarone as prescribed  Note  aspirin has been discontinued because of gastritis  Chronic diastolic CHF (congestive heart failure) (HCC) Chronic diastolic heart failure which is well compensated at this time without diuretics  Near syncope Near syncope secondary to slow ventricular response now improved  Diabetes mellitus type 2 in obese [facility]) Poorly controlled type 2 diabetes  Patient is now on Lantus and will increase to 30 units nightly and continue metformin at thousand milligrams twice daily  The patient will continue her diet program and monitoring blood sugars at home  Note foot exam was normal except for toenail fungus today  Hemoglobin A1c will be checked  Toenail fungus Both great toenails in both feet have a mild degree of toenail fungus and the patient is having difficulty cutting her toenails and I am concerned about injury to these with her diabetes therefore will refer to podiatry  Hypertriglyceridemia History of hypertriglyceridemia which resulted in pancreatitis now well controlled  Hypokalemia Recent hypokalemia now resolved  Hyponatremia Will monitor sodium level with a metabolic panel today  Depression 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  Diagnoses and all orders for this visit:  Diabetes mellitus type 2 in obese ([place]) -     POCT glucose (manual entry) -     Ambulatory referral to Podiatry -     Hemoglobin A1c  Toenail fungus -     Ambulatory referral to Podiatry  Reactive depression  Essential hypertension -     Comprehensive metabolic panel  Hypertriglyceridemia -     Comprehensive metabolic panel  Paroxysmal A-fib (HCC) -     Comprehensive metabolic panel  Atrial fibrillation with slow ventricular response (HCC)  AKI (acute kidney injury) ([place])  Hyponatremia -     Comprehensive metabolic panel  Hypokalemia -     Comprehensive metabolic panel  Iron deficiency anemia due to chronic blood loss -     CBC with Differential/Platelet; Future -     CBC with Differential/Platelet  Breast cancer screening by mammogram -     MM DIGITAL SCREENING BILATERAL; Future  Encounter for hepatitis C screening test for low risk patient -     Hepatitis c antibody (reflex)  Hypomagnesemia -     Magnesium; Future -     Magnesium  Normocytic anemia -  pantoprazole (PROTONIX) 40 MG tablet; Take 1 tablet (40 mg total) by mouth daily.  Non-intractable vomiting with nausea, unspecified vomiting type -     cetirizine (ZYRTEC) 10 MG tablet; Take 1 tablet (10 mg total) by mouth daily.  Chronic diastolic CHF (congestive heart failure) (HCC)  Near syncope  Symptomatic bradycardia  Symptomatic anemia  Gastritis and duodenitis  Other orders -     omega-3 acid ethyl esters (LOVAZA) 1 g capsule; Take 2 capsules (2 g total) by mouth 2 (two) times daily. -     mupirocin ointment (BACTROBAN) 2 %; Place 1 application into the nose 2 (two) times daily. -     metFORMIN (GLUCOPHAGE) 500 MG tablet; Take 2 tablets (1,000 mg total) by  mouth 2 (two) times daily with a meal. -     Insulin Glargine (LANTUS SOLOSTAR) 100 UNIT/ML Solostar Pen; Inject 30 Units into the skin at bedtime. -     ferrous sulfate 325 (65 FE) MG tablet; Take 1 tablet (325 mg total) by mouth 2 (two) times daily with a meal. -     dicyclomine (BENTYL) 20 MG tablet; Take 1 tablet (20 mg total) by mouth 2 (two) times daily as needed for spasms. -     amiodarone (PACERONE) 200 MG tablet; Take 1 tablet (200 mg total) by mouth daily. New instructions  A screening mammogram was ordered

## 2018-07-19 NOTE — Telephone Encounter (Signed)
Patient seen today   See note

## 2018-07-19 NOTE — Assessment & Plan Note (Signed)
Both great toenails in both feet have a mild degree of toenail fungus and the patient is having difficulty cutting her toenails and I am concerned about injury to these with her diabetes therefore will refer to podiatry

## 2018-07-19 NOTE — Assessment & Plan Note (Signed)
Poorly controlled type 2 diabetes  Patient is now on Lantus and will increase to 30 units nightly and continue metformin at thousand milligrams twice daily  The patient will continue her diet program and monitoring blood sugars at home  Note foot exam was normal except for toenail fungus today  Hemoglobin A1c will be checked

## 2018-07-20 LAB — CBC WITH DIFFERENTIAL/PLATELET
Basophils Absolute: 0 10*3/uL (ref 0.0–0.2)
Basos: 0 %
EOS (ABSOLUTE): 0.2 10*3/uL (ref 0.0–0.4)
Eos: 3 %
Hematocrit: 32.4 % — ABNORMAL LOW (ref 34.0–46.6)
Hemoglobin: 10 g/dL — ABNORMAL LOW (ref 11.1–15.9)
Immature Grans (Abs): 0 10*3/uL (ref 0.0–0.1)
Immature Granulocytes: 0 %
Lymphocytes Absolute: 1.8 10*3/uL (ref 0.7–3.1)
Lymphs: 26 %
MCH: 24.6 pg — ABNORMAL LOW (ref 26.6–33.0)
MCHC: 30.9 g/dL — ABNORMAL LOW (ref 31.5–35.7)
MCV: 80 fL (ref 79–97)
MONOCYTES: 7 %
Monocytes Absolute: 0.5 10*3/uL (ref 0.1–0.9)
Neutrophils Absolute: 4.4 10*3/uL (ref 1.4–7.0)
Neutrophils: 64 %
PLATELETS: 207 10*3/uL (ref 150–450)
RBC: 4.06 x10E6/uL (ref 3.77–5.28)
RDW: 22.8 % — AB (ref 11.7–15.4)
WBC: 7 10*3/uL (ref 3.4–10.8)

## 2018-07-20 LAB — COMPREHENSIVE METABOLIC PANEL
ALT: 14 IU/L (ref 0–32)
AST: 18 IU/L (ref 0–40)
Albumin/Globulin Ratio: 1.5 (ref 1.2–2.2)
Albumin: 4.2 g/dL (ref 3.8–4.8)
Alkaline Phosphatase: 68 IU/L (ref 39–117)
BUN/Creatinine Ratio: 35 — ABNORMAL HIGH (ref 12–28)
BUN: 28 mg/dL — ABNORMAL HIGH (ref 8–27)
Bilirubin Total: <0.2 mg/dL (ref 0.0–1.2)
CALCIUM: 9.6 mg/dL (ref 8.7–10.3)
CO2: 21 mmol/L (ref 20–29)
Chloride: 102 mmol/L (ref 96–106)
Creatinine, Ser: 0.8 mg/dL (ref 0.57–1.00)
GFR calc Af Amer: 92 mL/min/{1.73_m2} (ref >59)
GFR, EST NON AFRICAN AMERICAN: 80 mL/min/{1.73_m2} (ref >59)
Globulin, Total: 2.8 g/dL (ref 1.5–4.5)
Glucose: 207 mg/dL — ABNORMAL HIGH (ref 65–99)
Potassium: 4.4 mmol/L (ref 3.5–5.2)
SODIUM: 140 mmol/L (ref 134–144)
Total Protein: 7 g/dL (ref 6.0–8.5)

## 2018-07-20 LAB — POCT ABI - SCREENING FOR PILOT NO CHARGE
Immediate ABI left: 1.34
Immediate ABI right: 1.3

## 2018-07-20 LAB — HEPATITIS C ANTIBODY (REFLEX): HCV Ab: <0.1 s/co ratio (ref 0.0–0.9)

## 2018-07-20 LAB — HEMOGLOBIN A1C
ESTIMATED AVERAGE GLUCOSE: 226 mg/dL
Hgb A1c MFr Bld: 9.5 % — ABNORMAL HIGH (ref 4.8–5.6)

## 2018-07-20 LAB — MAGNESIUM: Magnesium: 2.8 mg/dL — ABNORMAL HIGH (ref 1.6–2.3)

## 2018-07-20 LAB — HCV COMMENT:

## 2018-07-20 MED FILL — ELIQUIS 5 MG TABLET: 5 | 30 days supply | Qty: 60 | Fill #1

## 2018-07-20 NOTE — Addendum Note (Signed)
Addended by: Margaretmary Lombard on: 07/20/2018 12:18 PM   Modules accepted: Orders

## 2018-07-21 ENCOUNTER — Encounter: Payer: Self-pay | Admitting: *Deleted

## 2018-07-21 NOTE — Progress Notes (Signed)
MA unable to reach patient via contact. Will send a communication letter to address via Epic.

## 2018-07-22 NOTE — Progress Notes (Signed)
Thank you Nubia 

## 2018-07-29 ENCOUNTER — Ambulatory Visit: Payer: Medicaid Other

## 2018-08-02 ENCOUNTER — Ambulatory Visit: Payer: Self-pay | Attending: Family Medicine | Admitting: Pharmacist

## 2018-08-02 VITALS — BP 109/57 | HR 52

## 2018-08-02 DIAGNOSIS — E669 Obesity, unspecified: Secondary | ICD-10-CM

## 2018-08-02 DIAGNOSIS — E1169 Type 2 diabetes mellitus with other specified complication: Secondary | ICD-10-CM

## 2018-08-02 MED FILL — ATORVASTATIN CALCIUM 40 MG: 40 | 30 days supply | Qty: 30 | Fill #1

## 2018-08-02 NOTE — Patient Instructions (Signed)
Thank you for coming to see me today. Please do the following:  1. Increase metformin to 2 tablets 2 times a day.  2. Continue Lantus 30 units but start taking in the morning.  3. Continue checking blood sugars at home.  4. Continue making the lifestyle changes we've discussed together during our visit. Diet and exercise play a significant role in improving your blood sugars.  5. Follow-up with Dr. Jillyn Hidden in 2 weeks.    Hypoglycemia or low blood sugar:   Low blood sugar can happen quickly and may become an emergency if not treated right away.   While this shouldn't happen often, it can be brought upon if you skip a meal or do not eat enough. Also, if your insulin or other diabetes medications are dosed too high, this can cause your blood sugar to go to low.   Warning signs of low blood sugar include: 1. Feeling shaky or dizzy 2. Feeling weak or tired  3. Excessive hunger 4. Feeling anxious or upset  5. Sweating even when you aren't exercising  What to do if I experience low blood sugar? 1. Check your blood sugar with your meter. If lower than 70, proceed to step 2.  2. Treat with 3-4 glucose tablets or 3 packets of regular sugar. If these aren't around, you can try hard candy. Yet another option would be to drink 4 ounces of fruit juice or 6 ounces of REGULAR soda.  3. Re-check your sugar in 15 minutes. If it is still below 70, do what you did in step 2 again. If has come back up, go ahead and eat a snack or small meal at this time.

## 2018-08-02 NOTE — Progress Notes (Signed)
S:    PCP: Dr. Jillyn Hidden  No chief complaint on file.  Patient arrives in good spirits.  Presents for diabetes management at the request of Dr. Delford Field. Patient was referred on 07/19/2018.    Family/Social History:  - FHx: diabetes (sister); heart attack (mom, dad) - Tobacco: never smoker - Alcohol: denies using alcohol  Insurance coverage/medication affordability:  - DNBI  Patient denies adherence with medications.  Current diabetes medications include:  - Metformin 1000mg  BID (patient reports 500mg  BID) - Lantus 30 units HS Current hypertension medications include:  - none  Patient reports hypoglycemic events overnight. Patient reports waking up to use the bathroom and feeling shaky. Does not provide objective readings.  Patient reported dietary habits:  - Eats 3 meals/day - Breakfast: cereal or eggs - Lunch: tuna fish or soup - Dinner:steak and potatoes - Drinks: Gatorade - Denies eating sweets.  Patient-reported exercise habits: nothing outside of work   Patient reports nocturia.  Patient denies neuropathy. Patient denies visual changes. Patient reports self foot exams.   O:  BP: 109/57, HR 52    POCT glucose : 131 Home fasting CBG: 140s-180s with some over 200  2 hour post-prandial/random CBG: 140s-180s.  Lab Results  Component Value Date   HGBA1C 9.5 (H) 07/19/2018   Vitals:   08/02/18 0938  BP: (!) 109/57  Pulse: (!) 52   Lipid Panel     Component Value Date/Time   CHOL 187 03/22/2018 1053   TRIG 163 (H) 03/22/2018 1053   HDL 49 03/22/2018 1053   CHOLHDL 3.8 03/22/2018 1053   CHOLHDL 8.8 (H) 11/21/2016 0822   VLDL NOT CALC 11/21/2016 0822   LDLCALC 105 (H) 03/22/2018 1053   LDLDIRECT 211 (H) 05/04/2017 0842   LDLDIRECT 170 (H) 11/21/2016 0822   Clinical ASCVD: YES - CAD The 10-year ASCVD risk score Denman George DC Jr., et al., 2013) is: 5.3%   Values used to calculate the score:     Age: 89 years     Sex: Female     Is Non-Hispanic African  American: No     Diabetic: Yes     Tobacco smoker: No     Systolic Blood Pressure: 109 mmHg     Is BP treated: No     HDL Cholesterol: 49 mg/dL     Total Cholesterol: 187 mg/dL   A/P: Diabetes longstanding currently uncontrolled. Patient is able to verbalize appropriate hypoglycemia management plan. Patient is not adherent with medication. Control is suboptimal due to some dietary indiscretion and physical inactivity.  Most recent lab study indicates that pt's renal function has normalized. High PYK:DXIPJASNKN ratio, probably dehydration. Will have her increase metformin to 1g BID. Pt has been counseled to drink Gatorade "Zero" and increase intake of water.   Would like better glycemic control. Her BP is low today, not ideal to start SGLT-2 inhibitor. Additionally, pt has history of AKI, dehydration. Hx of pancreatitis - will not add GLP-1 RA at this time. Recommend AGAINST TZD or DPP-IV inhibitor d/t heart failure hx. To minimize hypoglycemia risk, will have pt take her insulin in the morning. Plan to reassess glycemic control in 2 weeks with PCP.   -Continued Lantus 30 units; will move to the morning. -Increased dose of metformin to 1000 mg BID -Extensively discussed pathophysiology of DM, recommended lifestyle interventions, dietary effects on glycemic control -Counseled on s/sx of and management of hypoglycemia -Next A1C anticipated 10/2018.  -UTD on vaccines   ASCVD risk - secondary prevention in  patient with DM. Last LDL is not controlled.Tolerating atorvastatin well. -Continued atorvastatin 40 mg.   Hypertension longstanding currently controlled, over-controlled. BP goal <130/80 mmHg. Patient does not currently take anti-hypertensives. Microalb:creatinine ratio elevated. Would like to add low-dose lisinopril but cannot at this time.   Written patient instructions provided.  Total time in face to face counseling 30 minutes.   Follow up w/ PCP in March.     Patient seen  with: Arletha Pili, PharmD Candidate  [employer]  Class of 2022  Butch Penny, PharmD, CPP Clinical Pharmacist [facility] [phone]

## 2018-08-03 ENCOUNTER — Encounter: Payer: Self-pay | Admitting: Pharmacist

## 2018-08-03 LAB — GLUCOSE, POCT (MANUAL RESULT ENTRY): POC Glucose: 131 mg/dL — AB (ref 70–99)

## 2018-08-03 MED FILL — !LANTUS SOLOSTAR 100UNITS/M: 100 | 30 days supply | Qty: 9 | Fill #1

## 2018-08-03 MED FILL — TRUEPLUS PEN NDL 32GX5/32: 32G X 4 MM | 30 days supply | Qty: 100 | Fill #1

## 2018-08-03 MED FILL — TRUEPLUS PEN NDL 32GX5/32": 32G X 4 MM | 30 days supply | Qty: 100 | Fill #1

## 2018-08-17 ENCOUNTER — Encounter: Payer: Self-pay | Admitting: Family Medicine

## 2018-08-17 ENCOUNTER — Ambulatory Visit: Payer: Self-pay | Attending: Family Medicine | Admitting: Family Medicine

## 2018-08-17 VITALS — BP 82/52 | HR 54 | Temp 98.0°F | Ht 63.0 in | Wt 164.0 lb

## 2018-08-17 DIAGNOSIS — M545 Low back pain, unspecified: Secondary | ICD-10-CM

## 2018-08-17 DIAGNOSIS — Z794 Long term (current) use of insulin: Secondary | ICD-10-CM

## 2018-08-17 DIAGNOSIS — E1169 Type 2 diabetes mellitus with other specified complication: Secondary | ICD-10-CM

## 2018-08-17 DIAGNOSIS — E1165 Type 2 diabetes mellitus with hyperglycemia: Secondary | ICD-10-CM

## 2018-08-17 DIAGNOSIS — B029 Zoster without complications: Secondary | ICD-10-CM

## 2018-08-17 DIAGNOSIS — M25551 Pain in right hip: Secondary | ICD-10-CM

## 2018-08-17 DIAGNOSIS — M5440 Lumbago with sciatica, unspecified side: Secondary | ICD-10-CM

## 2018-08-17 LAB — GLUCOSE, POCT (MANUAL RESULT ENTRY): POC Glucose: 218 mg/dL — AB (ref 70–99)

## 2018-08-17 MED ORDER — VALACYCLOVIR HCL 1 G PO TABS: 500.0000 mg | ORAL_TABLET | Freq: Three times a day (TID) | ORAL | 0 refills | Status: AC

## 2018-08-17 MED ORDER — TRAMADOL HCL 50 MG PO TABS: 50.0000 mg | ORAL_TABLET | Freq: Three times a day (TID) | ORAL | 1 refills | Status: DC | PRN

## 2018-08-17 MED FILL — traMADol HCL 50 MG TABS: 50 | 10 days supply | Qty: 30 | Fill #0

## 2018-08-17 MED FILL — valACYclovir HCL 1 GM TABS: 1 | 14 days supply | Qty: 21 | Fill #0

## 2018-08-17 NOTE — Progress Notes (Signed)
Patient concerned about red bump on side, says it is going away but she wants to make sure that it is not shingles. She said it is not itching but it does hurt. She is also having some rt hip pain that is radiating towards her knee and it has been going on for about 3 days but she said it only occurs when she sleeps on her right side.

## 2018-08-17 NOTE — Patient Instructions (Signed)
Shingles    Shingles is an infection. It gives you a painful skin rash and blisters that have fluid in them. Shingles is caused by the same germ (virus) that causes chickenpox.  Shingles only happens in people who:   Have had chickenpox.   Have been given a shot of medicine (vaccine) to protect against chickenpox. Shingles is rare in this group.  The first symptoms of shingles may be itching, tingling, or pain in an area on your skin. A rash will show on your skin a few days or weeks later. The rash is likely to be on one side of your body. The rash usually has a shape like a belt or a band. Over time, the rash turns into fluid-filled blisters. The blisters will break open, change into scabs, and dry up. Medicines may:   Help with pain and itching.   Help you get better sooner.   Help to prevent long-term problems.  Follow these instructions at home:  Medicines   Take over-the-counter and prescription medicines only as told by your doctor.   Put on an anti-itch cream or numbing cream where you have a rash, blisters, or scabs. Do this as told by your doctor.  Helping with itching and discomfort     Put cold, wet cloths (cold compresses) on the area of the rash or blisters as told by your doctor.   Cool baths can help you feel better. Try adding baking soda or dry oatmeal to the water to lessen itching. Do not bathe in hot water.  Blister and rash care   Keep your rash covered with a loose bandage (dressing).   Wear loose clothing that does not rub on your rash.   Keep your rash and blisters clean. To do this, wash the area with mild soap and cool water as told by your doctor.   Check your rash every day for signs of infection. Check for:  ? More redness, swelling, or pain.  ? Fluid or blood.  ? Warmth.  ? Pus or a bad smell.   Do not scratch your rash. Do not pick at your blisters. To help you to not scratch:  ? Keep your fingernails clean and cut short.  ? Wear gloves or mittens when you sleep, if  scratching is a problem.  General instructions   Rest as told by your doctor.   Keep all follow-up visits as told by your doctor. This is important.   Wash your hands often with soap and water. If soap and water are not available, use hand sanitizer. Doing this lowers your chance of getting a skin infection caused by germs (bacteria).   Your infection can cause chickenpox in people who have never had chickenpox or never got a shot of chickenpox vaccine. If you have blisters that did not change into scabs yet, try not to touch other people or be around other people, especially:  ? Babies.  ? Pregnant women.  ? Children who have areas of red, itchy, or rough skin (eczema).  ? Very old people who have transplants.  ? People who have a long-term (chronic) sickness, like cancer or AIDS.  Contact a doctor if:   Your pain does not get better with medicine.   Your pain does not get better after the rash heals.   You have any signs of infection in the rash area. These signs include:  ? More redness, swelling, or pain around the rash.  ? Fluid or blood coming from   the rash.  ? The rash area feeling warm to the touch.  ? Pus or a bad smell coming from the rash.  Get help right away if:   The rash is on your face or nose.   You have pain in your face or pain by your eye.   You lose feeling on one side of your face.   You have trouble seeing.   You have ear pain, or you have ringing in your ear.   You have a loss of taste.   Your condition gets worse.  Summary   Shingles gives you a painful skin rash and blisters that have fluid in them.   Shingles is an infection. It is caused by the same germ (virus) that causes chickenpox.   Keep your rash covered with a loose bandage (dressing). Wear loose clothing that does not rub on your rash.   If you have blisters that did not change into scabs yet, try not to touch other people or be around people.  This information is not intended to replace advice given to you by  your health care provider. Make sure you discuss any questions you have with your health care provider.  Document Released: 11/19/2007 Document Revised: 02/04/2017 Document Reviewed: 02/04/2017  Elsevier Interactive Patient Education  2019 [employer] Inc[employer]

## 2018-08-17 NOTE — Progress Notes (Signed)
Established Patient Office Visit  Subjective:  Patient ID: Emily Farmer, female    DOB: 21-Mar-1956  Age: 63 y.o. MRN: [id number]  CC: new onset rash; back pain and radiation of pain to the right leg-[name] Chief Complaint  Patient presents with  . Follow-up    HPI Emily Farmer presents due to onset for the past 3 days of a rash on her right lower back/buttock area which is painful with a sharp, burning sensation and the pain is a 9 on a 0-to-10 scale.  Patient has also had some pain in her right low back, right hip and feels as if there is some pain radiating from her back that goes down the outside of her thigh.  Patient believes that she may have shingles.  Patient states that she did receive vaccination against shingles a few years ago.  Patient states that she tried Tylenol which did slightly improve the pain.  Patient states that her sister insisted that she take ibuprofen but patient states that she knew she was not supposed to take ibuprofen or aspirin because she is on a blood thinner due to her atrial fibrillation however patient was in so much pain that she took ibuprofen but this did not really help.  Patient has not had shingles in the past.      Patient reports that her blood sugars are up and down.  Patient did have episode of blood sugar that was fairly normal at 140 fasting last week.  Patient denies any hypoglycemic episodes.  Patient is taking insulin as prescribed.  Patient is taking her medications for her atrial fibrillation and has had no further episodes of increased heart rate, chest pain or chest pressure and no shortness of breath.  Past Medical History:  Diagnosis Date  . Arthritis   . Atrial fibrillation ([place])    a. 09/2016 in setting of pancreatitis;  b. 09/2016 Echo: EF 65-60%, no rwma, Gr2 DD, mild MR, triv TR, PASP 41mHg;  CHA2DS2VASc = 4-->Eliquis 516mBID. c. Recurred 06/2018 in setting of GIB.  . Marland Kitchenhronic diastolic CHF (congestive heart failure) ([place] 01/28/2018  . Diabetes mellitus   . GI bleeding 06/2018  . Gout   . Hyperlipidemia   . Hypertension   . Hypertriglyceridemia   . Pancreatitis    a. 09/2016 - Triglycerides 1,392 on admission.    Past Surgical History:  Procedure Laterality Date  . BIOPSY  06/20/2018   Procedure: BIOPSY;  Surgeon: MaClarene EssexMD;  [facility];  Service: Endoscopy;;  . CHOLECYSTECTOMY N/A 01/04/2014   Procedure: LAPAROSCOPIC CHOLECYSTECTOMY WITH INTRAOPERATIVE CHOLANGIOGRAM;  Surgeon: ArRalene OkMD;  [facility]: [facility];  Laterality: N/A;  . COLONOSCOPY WITH PROPOFOL N/A 06/21/2018   Procedure: COLONOSCOPY WITH PROPOFOL;  Surgeon: BuRonald LoboMD;  [place] [facility]: [facility];  Laterality: N/A;  . ESOPHAGOGASTRODUODENOSCOPY (EGD) WITH PROPOFOL N/A 06/20/2018   Procedure: ESOPHAGOGASTRODUODENOSCOPY (EGD) WITH PROPOFOL;  Surgeon: MaClarene EssexMD;  [facility];  Laterality: N/A;  . LUNG SURGERY      Family History  Problem Relation Age of Onset  . Heart attack Mother   . Heart attack Father   . Diabetes Sister   . High blood pressure Neg Hx   . High Cholesterol Neg Hx     Social History   Socioeconomic History  . Marital status: Single    Spouse name: Not on file  . Number of children: Not on file  .  Years of education: Not on file  . Highest education level: Not on file  Occupational History  . Not on file  Social Needs  . Financial resource strain: Not on file  . Food insecurity:    Worry: Not on file    Inability: Not on file  . Transportation needs:    Medical: Not on file    Non-medical: Not on file  Tobacco Use  . Smoking status: Never Smoker  . Smokeless tobacco: Never Used  Substance and Sexual Activity  . Alcohol use: No  . Drug use: No  . Sexual activity: Not Currently  Lifestyle  . Physical activity:    Days per week: Not on file    Minutes per session: Not on file  . Stress: Not on file    Relationships  . Social connections:    Talks on phone: Not on file    Gets together: Not on file    Attends religious service: Not on file    Active member of club or organization: Not on file    Attends meetings of clubs or organizations: Not on file    Relationship status: Not on file  . Intimate partner violence:    Fear of current or ex partner: Not on file    Emotionally abused: Not on file    Physically abused: Not on file    Forced sexual activity: Not on file  Other Topics Concern  . Not on file  Social History Narrative   Lives with her sister and does not need any assistance with ADLs.      Outpatient Medications Prior to Visit  Medication Sig Dispense Refill  . acetaminophen (TYLENOL) 325 MG tablet Take 2 tablets (650 mg total) by mouth every 6 (six) hours as needed for mild pain (or Fever >/= 101).    Marland Kitchen amiodarone (PACERONE) 200 MG tablet Take 1 tablet (200 mg total) by mouth daily. New instructions 30 tablet 2  . apixaban (ELIQUIS) 5 MG TABS tablet Take 1 tablet (5 mg total) by mouth 2 (two) times daily. 60 tablet 11  . atorvastatin (LIPITOR) 40 MG tablet Take 1 tablet (40 mg total) by mouth daily. 90 tablet 3  . Blood Glucose Monitoring Suppl (TRUE METRIX METER) w/Device KIT Use to monitor blood sugar as directed 1 kit 0  . cetirizine (ZYRTEC) 10 MG tablet Take 1 tablet (10 mg total) by mouth daily. 30 tablet 2  . ferrous sulfate 325 (65 FE) MG tablet Take 1 tablet (325 mg total) by mouth 2 (two) times daily with a meal. 60 tablet 2  . glucose blood (TRUE METRIX BLOOD GLUCOSE TEST) test strip Use as instructed to check blood sugars 3 times per day 100 each 11  . Insulin Glargine (LANTUS SOLOSTAR) 100 UNIT/ML Solostar Pen Inject 30 Units into the skin at bedtime. 15 mL 4  . Insulin Pen Needle (TRUEPLUS PEN NEEDLES) 32G X 4 MM MISC Use with insulin pen to inject Lantus daily. 100 each 11  . Insulin Syringe-Needle U-100 (TRUEPLUS INSULIN SYRINGE) 31G X 5/16" 0.3 ML MISC  Use to inject insulin daily. 100 each 11  . metFORMIN (GLUCOPHAGE) 500 MG tablet Take 2 tablets (1,000 mg total) by mouth 2 (two) times daily with a meal. 120 tablet 2  . TRUEPLUS LANCETS 28G MISC Use to check blood sugar 3 times per day 100 each 11  . dicyclomine (BENTYL) 20 MG tablet Take 1 tablet (20 mg total) by mouth 2 (two) times daily  as needed for spasms. (Patient not taking: Reported on 08/17/2018) 40 tablet 0  . fenofibrate 160 MG tablet Take 1 tablet (160 mg total) by mouth daily. (Patient not taking: Reported on 08/17/2018) 30 tablet 11  . hydroxypropyl methylcellulose / hypromellose (ISOPTO TEARS / GONIOVISC) 2.5 % ophthalmic solution Place 1 drop into both eyes as needed for dry eyes.    . Multiple Vitamins-Minerals (CENTRUM SILVER PO) Take 1 tablet by mouth daily.    . mupirocin ointment (BACTROBAN) 2 % Place 1 application into the nose 2 (two) times daily. (Patient not taking: Reported on 08/17/2018) 30 g 0  . omega-3 acid ethyl esters (LOVAZA) 1 g capsule Take 2 capsules (2 g total) by mouth 2 (two) times daily. (Patient not taking: Reported on 08/17/2018) 120 capsule 2  . ondansetron (ZOFRAN-ODT) 4 MG disintegrating tablet Take 4 mg by mouth every 8 (eight) hours as needed for nausea or vomiting.    . pantoprazole (PROTONIX) 40 MG tablet Take 1 tablet (40 mg total) by mouth daily. (Patient not taking: Reported on 08/17/2018) 30 tablet 2   No facility-administered medications prior to visit.     No Known Allergies  ROS Review of Systems  Constitutional: Positive for fatigue. Negative for chills and fever.  HENT: Negative for sore throat and trouble swallowing.   Respiratory: Negative for cough and shortness of breath.   Cardiovascular: Negative for chest pain, palpitations and leg swelling.  Gastrointestinal: Negative for abdominal pain, blood in stool, constipation, diarrhea and nausea.  Genitourinary: Negative for dysuria and frequency.  Musculoskeletal: Positive for arthralgias,  back pain and gait problem.  Skin: Positive for rash. Negative for wound.  Neurological: Negative for dizziness and headaches.      Objective:    Physical Exam  BP (!) 82/52 (BP Location: Right Arm, Patient Position: Sitting, Cuff Size: Normal)   Pulse (!) 54   Temp 98 F (36.7 C) (Oral)   Ht 5' 3" (1.6 m)   Wt 164 lb (74.4 kg)   SpO2 99%   BMI 29.05 kg/m nurses notes and vital signs reviewed Wt Readings from Last 3 Encounters:  08/17/18 164 lb (74.4 kg)  07/19/18 158 lb (71.7 kg)  07/01/18 152 lb 9.6 oz (69.2 kg)   General-well-nourished, well-developed older female in no acute distress Lungs-clear to auscultation bilaterally Cardiovascular- regular rate with borderline tachycardia and regular rhythm Abdomen-soft, nontender Back- no CVA tenderness, patient with mild lumbosacral discomfort but does have point tenderness of the right SI joint and slightly positive seated right leg raise. Musculoskeletal- patient with tenderness to palpation over the greater trochanter of the right hip but she does not have any tenderness with internal or external rotation of the right hip Skin- patient with an erythematous based pustular rash in grouped vesicles, some of which appear to be resolving, starting to scab on the right buttock extending up towards the right lower back Psych-normal mood and judgment  Health Maintenance Due  Topic Date Due  . PAP SMEAR-Modifier  03/27/1978  . MAMMOGRAM  01/29/2012    There are no preventive care reminders to display for this patient.  Lab Results  Component Value Date   TSH 1.554 06/26/2018   Lab Results  Component Value Date   WBC 7.0 07/19/2018   HGB 10.0 (L) 07/19/2018   HCT 32.4 (L) 07/19/2018   MCV 80 07/19/2018   PLT 207 07/19/2018   Lab Results  Component Value Date   NA 140 07/19/2018   K 4.4 07/19/2018  CO2 21 07/19/2018   GLUCOSE 207 (H) 07/19/2018   BUN 28 (H) 07/19/2018   CREATININE 0.80 07/19/2018   BILITOT <0.2  07/19/2018   ALKPHOS 68 07/19/2018   AST 18 07/19/2018   ALT 14 07/19/2018   PROT 7.0 07/19/2018   ALBUMIN 4.2 07/19/2018   CALCIUM 9.6 07/19/2018   ANIONGAP 6 06/26/2018   Lab Results  Component Value Date   CHOL 187 03/22/2018   Lab Results  Component Value Date   HDL 49 03/22/2018   Lab Results  Component Value Date   LDLCALC 105 (H) 03/22/2018   Lab Results  Component Value Date   TRIG 163 (H) 03/22/2018   Lab Results  Component Value Date   CHOLHDL 3.8 03/22/2018       Assessment & Plan:  1. Herpes zoster without complication Patient with shingles on the right buttock.  Patient will be placed on Valtrex 500 mg 3 times daily x7 days which is somewhat lower than the recommended dose but dose has been lowered secondary to patient's age.  Patient did have normal renal function on her most recent BMP on review of chart.  Patient has been provided tramadol to take as needed for pain as she cannot take nonsteroidal anti-inflammatories due to concurrent use of blood thinning medication for atrial fibrillation.  Patient can also take Tylenol for less severe pain.  Patient has been asked to follow-up in 1 week or sooner if she has worsening of symptoms, medication intolerance or any concerns. - valACYclovir (VALTREX) 1000 MG tablet; Take 0.5 tablets (500 mg total) by mouth 3 (three) times daily for 7 days. For treatment of shingles  Dispense: 21 tablet; Refill: 0 - traMADol (ULTRAM) 50 MG tablet; Take 1 tablet (50 mg total) by mouth every 8 (eight) hours as needed for moderate pain.  Dispense: 30 tablet; Refill: 1  2. Low back pain with radiation Patient with low back pain with radiation.  Patient with suspected lumbar radiculopathy as a cause of her back pain with radiation to the left leg.  Patient also however has shingles at this time which could be contributing to the sensation of pain with radiation.  Patient is being prescribed Valtrex for treatment of the shingles as well as  tramadol to help with pain.  Patient will follow-up in 1 week for reevaluation if patient continues to have radiation of back pain, will consider addition of gabapentin to help with radicular type pain.  Will consider prednisone taper however patient has poorly controlled diabetes with most recent A1c of 9.5 and patient's blood sugars tend to be somewhat brittle with wide swings in blood sugar readings therefore will try to avoid prednisone taper if possible.  3. Right hip pain Patient with some point tenderness over the greater trochanter of the right hip but no discomfort with internal or external rotation of the hip.  Patient may have some bursitis in the hip or hip pain may be related to her suspected radiculopathy.  Patient is being prescribed tramadol for her shingles and this should help with her hip pain.  At patient's follow-up, will discuss obtaining x-rays of the right hip and lower back/lumbar spine if she continues to have hip and back pain  4. Poorly controlled type 2 diabetes mellitus ([place]) 5. Type 2 diabetes mellitus with other specified complication, with long-term current use of insulin ([place]) Patient with poorly controlled type 2 diabetes requiring long-term use of insulin.  Most recent hemoglobin A1c was 9.5 in February.  Patient is considered to be immunocompromised due to her diabetes. Patient's blood sugar today's visit was 131      Follow-up: Return in about 1 week (around 08/24/2018) for shingles. follow-up   Antony Blackbird, MD

## 2018-08-23 MED FILL — ELIQUIS 5 MG TABLET: 5 | 30 days supply | Qty: 60 | Fill #2

## 2018-08-23 MED FILL — METOPROLOL TARTRATE 25 MG T: 25 | 30 days supply | Qty: 90 | Fill #2

## 2018-08-26 ENCOUNTER — Ambulatory Visit: Payer: Medicaid Other | Admitting: Internal Medicine

## 2018-08-31 MED FILL — ATORVASTATIN CALCIUM 40 MG: 40 | 30 days supply | Qty: 30 | Fill #2

## 2018-09-01 ENCOUNTER — Telehealth: Payer: Self-pay | Admitting: *Deleted

## 2018-09-01 NOTE — Telephone Encounter (Signed)
Left message to cal back in regards to appt 09/02/18

## 2018-09-02 ENCOUNTER — Ambulatory Visit (INDEPENDENT_AMBULATORY_CARE_PROVIDER_SITE_OTHER): Payer: Self-pay | Admitting: Internal Medicine

## 2018-09-02 ENCOUNTER — Other Ambulatory Visit: Payer: Self-pay

## 2018-09-02 ENCOUNTER — Emergency Department (HOSPITAL_COMMUNITY): Payer: Medicaid Other

## 2018-09-02 ENCOUNTER — Emergency Department (HOSPITAL_COMMUNITY)
Admission: EM | Admit: 2018-09-02 | Discharge: 2018-09-02 | Disposition: A | Discharge status: Home / Self Care | Payer: Medicaid Other | Attending: Emergency Medicine | Admitting: Emergency Medicine

## 2018-09-02 ENCOUNTER — Encounter (HOSPITAL_COMMUNITY): Payer: Self-pay | Admitting: Emergency Medicine

## 2018-09-02 ENCOUNTER — Encounter (INDEPENDENT_AMBULATORY_CARE_PROVIDER_SITE_OTHER): Payer: Self-pay

## 2018-09-02 ENCOUNTER — Encounter: Payer: Self-pay | Admitting: Internal Medicine

## 2018-09-02 VITALS — BP 128/72 | HR 58 | Ht 63.0 in | Wt 163.0 lb

## 2018-09-02 DIAGNOSIS — I110 Hypertensive heart disease with heart failure: Secondary | ICD-10-CM | POA: Insufficient documentation

## 2018-09-02 DIAGNOSIS — S0181XA Laceration without foreign body of other part of head, initial encounter: Secondary | ICD-10-CM

## 2018-09-02 DIAGNOSIS — Y939 Activity, unspecified: Secondary | ICD-10-CM | POA: Insufficient documentation

## 2018-09-02 DIAGNOSIS — E119 Type 2 diabetes mellitus without complications: Secondary | ICD-10-CM | POA: Insufficient documentation

## 2018-09-02 DIAGNOSIS — I2510 Atherosclerotic heart disease of native coronary artery without angina pectoris: Secondary | ICD-10-CM | POA: Insufficient documentation

## 2018-09-02 DIAGNOSIS — G5603 Carpal tunnel syndrome, bilateral upper limbs: Secondary | ICD-10-CM

## 2018-09-02 DIAGNOSIS — Z79899 Other long term (current) drug therapy: Secondary | ICD-10-CM | POA: Insufficient documentation

## 2018-09-02 DIAGNOSIS — Z794 Long term (current) use of insulin: Secondary | ICD-10-CM | POA: Insufficient documentation

## 2018-09-02 DIAGNOSIS — Y92480 Sidewalk as the place of occurrence of the external cause: Secondary | ICD-10-CM | POA: Insufficient documentation

## 2018-09-02 DIAGNOSIS — I480 Paroxysmal atrial fibrillation: Secondary | ICD-10-CM | POA: Insufficient documentation

## 2018-09-02 DIAGNOSIS — S01411A Laceration without foreign body of right cheek and temporomandibular area, initial encounter: Secondary | ICD-10-CM | POA: Insufficient documentation

## 2018-09-02 DIAGNOSIS — Y998 Other external cause status: Secondary | ICD-10-CM | POA: Insufficient documentation

## 2018-09-02 DIAGNOSIS — S0990XA Unspecified injury of head, initial encounter: Secondary | ICD-10-CM

## 2018-09-02 DIAGNOSIS — D649 Anemia, unspecified: Secondary | ICD-10-CM

## 2018-09-02 DIAGNOSIS — I4891 Unspecified atrial fibrillation: Secondary | ICD-10-CM

## 2018-09-02 DIAGNOSIS — E785 Hyperlipidemia, unspecified: Secondary | ICD-10-CM | POA: Insufficient documentation

## 2018-09-02 DIAGNOSIS — Z7901 Long term (current) use of anticoagulants: Secondary | ICD-10-CM | POA: Insufficient documentation

## 2018-09-02 DIAGNOSIS — I5032 Chronic diastolic (congestive) heart failure: Secondary | ICD-10-CM | POA: Insufficient documentation

## 2018-09-02 DIAGNOSIS — W101XXA Fall (on)(from) sidewalk curb, initial encounter: Secondary | ICD-10-CM | POA: Insufficient documentation

## 2018-09-02 LAB — COMPREHENSIVE METABOLIC PANEL
ALBUMIN: 4.6 g/dL (ref 3.8–4.8)
ALT: 23 IU/L (ref 0–32)
AST: 22 IU/L (ref 0–40)
Albumin/Globulin Ratio: 1.9 (ref 1.2–2.2)
Alkaline Phosphatase: 82 IU/L (ref 39–117)
BUN/Creatinine Ratio: 40 — ABNORMAL HIGH (ref 12–28)
BUN: 36 mg/dL — ABNORMAL HIGH (ref 8–27)
Bilirubin Total: <0.2 mg/dL (ref 0.0–1.2)
CO2: 21 mmol/L (ref 20–29)
Calcium: 9.6 mg/dL (ref 8.7–10.3)
Chloride: 100 mmol/L (ref 96–106)
Creatinine, Ser: 0.91 mg/dL (ref 0.57–1.00)
GFR calc Af Amer: 79 mL/min/{1.73_m2} (ref >59)
GFR calc non Af Amer: 68 mL/min/{1.73_m2} (ref >59)
Globulin, Total: 2.4 g/dL (ref 1.5–4.5)
Glucose: 164 mg/dL — ABNORMAL HIGH (ref 65–99)
Potassium: 5.1 mmol/L (ref 3.5–5.2)
Sodium: 140 mmol/L (ref 134–144)
Total Protein: 7 g/dL (ref 6.0–8.5)

## 2018-09-02 LAB — CBC
HEMATOCRIT: 35.1 % (ref 34.0–46.6)
HEMOGLOBIN: 11.2 g/dL (ref 11.1–15.9)
MCH: 26.4 pg — ABNORMAL LOW (ref 26.6–33.0)
MCHC: 31.9 g/dL (ref 31.5–35.7)
MCV: 83 fL (ref 79–97)
Platelets: 254 10*3/uL (ref 150–450)
RBC: 4.24 x10E6/uL (ref 3.77–5.28)
RDW: 22.2 % — ABNORMAL HIGH (ref 11.7–15.4)
WBC: 7.5 10*3/uL (ref 3.4–10.8)

## 2018-09-02 LAB — TSH: TSH: 2.87 u[IU]/mL (ref 0.450–4.500)

## 2018-09-02 MED ORDER — LIDOCAINE-EPINEPHRINE (PF) 2 %-1:200000 IJ SOLN
10.0000 mL | Freq: Once | INTRAMUSCULAR | Status: DC
  Filled 2018-09-02: qty 20

## 2018-09-02 MED ORDER — AMIODARONE HCL 200 MG PO TABS: 100.0000 mg | ORAL_TABLET | Freq: Every day | ORAL | 6 refills | Status: DC

## 2018-09-02 NOTE — Discharge Instructions (Signed)
Please read and follow all provided instructions.  Your diagnoses today include:  1. Injury of head, initial encounter   2. Facial laceration, initial encounter     Tests performed today include:  CT scan of your head that did not show any serious injury.  Vital signs. See below for your results today.   Medications prescribed:   None  Take any prescribed medications only as directed.  Home care instructions:  Follow any educational materials contained in this packet.  Follow-up instructions: Please follow-up with your primary care provider in the next 3 days for removal of suture.    Return instructions:  SEEK IMMEDIATE MEDICAL ATTENTION IF:  There is confusion or drowsiness (although children frequently become drowsy after injury).   You cannot awaken the injured person.   You have more than one episode of vomiting.   You notice dizziness or unsteadiness which is getting worse, or inability to walk.   You have convulsions or unconsciousness.   You experience severe, persistent headaches not relieved by Tylenol.  You cannot use arms or legs normally.   There are changes in pupil sizes. (This is the black center in the colored part of the eye)   There is clear or bloody discharge from the nose or ears.   You have change in speech, vision, swallowing, or understanding.   Localized weakness, numbness, tingling, or change in bowel or bladder control.  You have any other emergent concerns.  Additional Information: You have had a head injury which does not appear to require admission at this time.  Your vital signs today were: BP (!) 153/76 (BP Location: Left Arm)    Pulse 67    Temp 98.6 F (37 C) (Oral)    Resp 14    Wt 73.9 kg    SpO2 97%    BMI 28.87 kg/m  If your blood pressure (BP) was elevated above 135/85 this visit, please have this repeated by your doctor within one month. --------------

## 2018-09-02 NOTE — Progress Notes (Signed)
OFFICE FOLLOW-UP NOTE  Chief Complaint:  Hospital follow-up, numbness and tingling in the hands  Primary Care Physician: Antony Blackbird, MD  HPI:  Emily Farmer is a 63 y.o. female with a past medial history significant for recent admission for atrial fibrillation with rapid ventricular response in the setting of acute pancreatitis. Triglycerides were markedly elevated in the thousands. She was initially placed on heparin and fenofibrate. Subsequently she was found to have an elevated LDL-C and was started on atorvastatin 10 mg daily. She was seen in follow-up by one of our nurse practitioners and noted that she was doing fairly well and that her cholesterol numbers had improved somewhat. This was several months ago. Today she tells me that she's been out of her medications and she last saw him. She says that she has no health insurance and although she works cannot afford her medications. She is only currently taking insulin and metformin, but should be on Eliquis for stroke prevention, atorvastatin, fenofibrate, metoprolol and pantoprazole. Most recently her triglycerides went up to 959, but were as low as 374 in April when she was on medication. Denies any recurrent palpitations.  06/01/2017  Emily Farmer was seen today in follow-up.  Unfortunately she has been noncompliant with medical therapy.  She had repeat lab work which shows essentially no change in her lipid profile.  Triglycerides remain in the 800s.  She is not taking her statin, fenofibrate or omega-3 fatty acids.  The main issue is cost for her.  She is also noncompliant with Eliquis.  She is interested in samples today.  She says that she had Medicaid through her son however he aged out of that and the Medicaid went with him.  She is apparently not been able to obtain Medicaid for herself.  I am not sure if she looked into an orange card.  05/11/2018  Emily Farmer is seen today in follow-up.  Fortunately she is managed to get Medicaid.   This is helped significantly for medication compliance.  After taking medications her numbers have improved significantly.  Her total cholesterol is dropped from 3 43-1 87.  Triglycerides from 816-163.  LDL is down 105.  She denies any recurrent pancreatitis.  Unfortunately she has had some atrial fibrillation.  He has been on Eliquis more consistently and underwent cardioversion in the ER over the summer.  She saw how main, PA-C in follow-up.  He increased her beta-blocker.  She appears to be in sinus rhythm today and denies any recurrent A. Fib.  09/02/2018  Emily Farmer was seen today in follow-up.  She was recently hospitalized in January with anemia and paroxysmal atrial fibrillation.  Work-up for the anemia was negative and she has been started on iron.  She is on Eliquis with no obvious sources of ongoing bleeding.  Overall she feels well.  Her only other complaint today is some numbness and tingling in the first 3 fingers of both hands which are intermittent.  It seems a little worse in the morning improves throughout the day.  She does do cleaning work and some repetitive activities.  PMHx:  Past Medical History:  Diagnosis Date  . Arthritis   . Atrial fibrillation ([place])    a. 09/2016 in setting of pancreatitis;  b. 09/2016 Echo: EF 65-60%, no rwma, Gr2 DD, mild MR, triv TR, PASP 12mHg;  CHA2DS2VASc = 4-->Eliquis 570mBID. c. Recurred 06/2018 in setting of GIB.  . Marland Kitchenhronic diastolic CHF (congestive heart failure) [place]  . Diabetes mellitus   .  GI bleeding 06/2018  . Gout   . Hyperlipidemia   . Hypertension   . Hypertriglyceridemia   . Pancreatitis    a. 09/2016 - Triglycerides 1,392 on admission.    Past Surgical History:  Procedure Laterality Date  . BIOPSY  06/20/2018   Procedure: BIOPSY;  Surgeon: Clarene Essex, MD;  Location: [facility] ENDOSCOPY;  Service: Endoscopy;;  . CHOLECYSTECTOMY N/A 01/04/2014   Procedure: LAPAROSCOPIC CHOLECYSTECTOMY WITH INTRAOPERATIVE CHOLANGIOGRAM;  Surgeon:  Ralene Ok, MD;  Location: [place];  Service: General;  Laterality: N/A;  . COLONOSCOPY WITH PROPOFOL N/A 06/21/2018   Procedure: COLONOSCOPY WITH PROPOFOL;  Surgeon: Ronald Lobo, MD;  Location: [place];  Service: Endoscopy;  Laterality: N/A;  . ESOPHAGOGASTRODUODENOSCOPY (EGD) WITH PROPOFOL N/A 06/20/2018   Procedure: ESOPHAGOGASTRODUODENOSCOPY (EGD) WITH PROPOFOL;  Surgeon: Clarene Essex, MD;  Location: [place];  Service: Endoscopy;  Laterality: N/A;  . LUNG SURGERY      FAMHx:  Family History  Problem Relation Age of Onset  . Heart attack Mother   . Heart attack Father   . Diabetes Sister   . High blood pressure Neg Hx   . High Cholesterol Neg Hx     SOCHx:   reports that she has never smoked. She has never used smokeless tobacco. She reports that she does not drink alcohol or use drugs.  ALLERGIES:  No Known Allergies  ROS: Pertinent items noted in HPI and remainder of comprehensive ROS otherwise negative.  HOME MEDS: Current Outpatient Medications on File Prior to Visit  Medication Sig Dispense Refill  . acetaminophen (TYLENOL) 325 MG tablet Take 2 tablets (650 mg total) by mouth every 6 (six) hours as needed for mild pain (or Fever >/= 101).    Marland Kitchen amiodarone (PACERONE) 200 MG tablet Take 1 tablet (200 mg total) by mouth daily. New instructions 30 tablet 2  . apixaban (ELIQUIS) 5 MG TABS tablet Take 1 tablet (5 mg total) by mouth 2 (two) times daily. 60 tablet 11  . atorvastatin (LIPITOR) 40 MG tablet Take 1 tablet (40 mg total) by mouth daily. 90 tablet 3  . Blood Glucose Monitoring Suppl (TRUE METRIX METER) w/Device KIT Use to monitor blood sugar as directed 1 kit 0  . cetirizine (ZYRTEC) 10 MG tablet Take 1 tablet (10 mg total) by mouth daily. 30 tablet 2  . dicyclomine (BENTYL) 20 MG tablet Take 1 tablet (20 mg total) by mouth 2 (two) times daily as needed for spasms. 40 tablet 0  . ferrous sulfate 325 (65 FE) MG tablet Take 1 tablet (325 mg total) by mouth 2  (two) times daily with a meal. 60 tablet 2  . glucose blood (TRUE METRIX BLOOD GLUCOSE TEST) test strip Use as instructed to check blood sugars 3 times per day 100 each 11  . Insulin Glargine (LANTUS SOLOSTAR) 100 UNIT/ML Solostar Pen Inject 30 Units into the skin at bedtime. 15 mL 4  . Insulin Pen Needle (TRUEPLUS PEN NEEDLES) 32G X 4 MM MISC Use with insulin pen to inject Lantus daily. 100 each 11  . Insulin Syringe-Needle U-100 (TRUEPLUS INSULIN SYRINGE) 31G X 5/16" 0.3 ML MISC Use to inject insulin daily. 100 each 11  . metFORMIN (GLUCOPHAGE) 500 MG tablet Take 2 tablets (1,000 mg total) by mouth 2 (two) times daily with a meal. 120 tablet 2  . Multiple Vitamins-Minerals (CENTRUM SILVER PO) Take 1 tablet by mouth daily.    . mupirocin ointment (BACTROBAN) 2 % Place 1 application into the nose 2 (two)  times daily. 30 g 0  . ondansetron (ZOFRAN-ODT) 4 MG disintegrating tablet Take 4 mg by mouth every 8 (eight) hours as needed for nausea or vomiting.    . pantoprazole (PROTONIX) 40 MG tablet Take 1 tablet (40 mg total) by mouth daily. 30 tablet 2  . traMADol (ULTRAM) 50 MG tablet Take 1 tablet (50 mg total) by mouth every 8 (eight) hours as needed for moderate pain. 30 tablet 1  . TRUEPLUS LANCETS 28G MISC Use to check blood sugar 3 times per day 100 each 11   No current facility-administered medications on file prior to visit.     LABS/IMAGING: No results found for this or any previous visit (from the past 48 hour(s)). No results found.  LIPID PANEL:    Component Value Date/Time   CHOL 187 03/22/2018 1053   TRIG 163 (H) 03/22/2018 1053   HDL 49 03/22/2018 1053   CHOLHDL 3.8 03/22/2018 1053   CHOLHDL 8.8 (H) 11/21/2016 0822   VLDL NOT CALC 11/21/2016 0822   LDLCALC 105 (H) 03/22/2018 1053   LDLDIRECT 211 (H) 05/04/2017 0842   LDLDIRECT 170 (H) 11/21/2016 0822     WEIGHTS: Wt Readings from Last 3 Encounters:  09/02/18 163 lb (73.9 kg)  08/17/18 164 lb (74.4 kg)  07/19/18 158 lb  (71.7 kg)    VITALS: BP 128/72   Pulse (!) 58   Ht _0  (1.6 m)   Wt 163 lb (73.9 kg)   BMI 28.87 kg/m   EXAM: General appearance: alert and no distress Neck: no carotid bruit, no JVD and thyroid not enlarged, symmetric, no tenderness/mass/nodules Lungs: clear to auscultation bilaterally Heart: regular rate and rhythm, S1, S2 normal, no murmur, click, rub or gallop Abdomen: soft, non-tender; bowel sounds normal; no masses,  no organomegaly Extremities: extremities normal, atraumatic, no cyanosis or edema Pulses: 2+ and symmetric Skin: Skin color, texture, turgor normal. No rashes or lesions Neurologic: Grossly normal Psych: Pleasant  EKG: Sinus bradycardia 58-personally reviewed  ASSESSMENT: 1. Hyperchylomicronemia 2. Pancreatitis secondary to problem #1 3. Paroxysmal atrial fibrillation-CHADSVASC score 4 4. Type 2 diabetes on insulin 5. Essential hypertension 6. Possible carpal tunnel syndrome 7. Iron deficient anemia  PLAN: 1.   Mrs. Muha was recently hospitalized in January with symptomatic anemia and atrial fibrillation.  She is now on amiodarone.  The dose has been decreased down to 200 mg daily.  She is maintaining a sinus bradycardia.  I recommend further decreasing the dose of medication to 100 mg daily.  I will check labs today including CBC, CMET and TSH as she is on amiodarone and has had symptomatic anemia on iron supplementation.  She also continues on Eliquis for anticoagulation.  Ultimately may transition from amiodarone to dronedarone or an alternative antiarrhythmic.  Finally, she is describing some numbness and tingling in the first 3 fingers of both hands.  This could represent carpal tunnel syndrome.  I recommended neutral wrist splints at night.  If this does not help resolve the symptoms, that she should follow-up with her PCP and she may need nerve conduction studies.  Plan follow-up 6 months or sooner as necessary.  Pixie Casino, MD, [facility], [place] Director of the Advanced Lipid Disorders &  Cardiovascular [facility] Attending Cardiologist  Direct Dial: [phone]  Fax: [phone]  Website:  [email or web address] 09/02/2018, 8:28 AM

## 2018-09-02 NOTE — ED Triage Notes (Signed)
Pt in via [facility] after mechanical fall - tripped and fell over curb. Landed on ground, hit R outer eye and has small lac present, still bleeding. Takes Eliquis, was wearing glasses but no glass shattered. No LOC, no vision changes, does have HA

## 2018-09-02 NOTE — Patient Instructions (Addendum)
Medication Instructions:  DECREASE DOSE OF AMIODARONE - TO 100 MG DAILY  ( 1/2 TABLET OF 200 MG) If you need a refill on your cardiac medications before your next appointment, please call your pharmacy.   Lab work: CBC cmp tsh -- today If you have labs (blood work) drawn today and your tests are completely normal, you will receive your results only by: Marland Kitchen MyChart Message (if you have MyChart) OR . A paper copy in the mail If you have any lab test that is abnormal or we need to change your treatment, we will call you to review the results.  Testing/Procedures: NOT NEEDED  Follow-Up: At [facility], you and your health needs are our priority.  As part of our continuing mission to provide you with exceptional heart care, we have created designated Provider Care Teams.  These Care Teams include your primary Cardiologist (physician) and Advanced Practice Providers (APPs -  Physician Assistants and Nurse Practitioners) who all work together to provide you with the care you need, when you need it. You will need a follow up appointment in 6 months.  Please call our office 2 months in advance to schedule this appointment.  You may see Chrystie Nose, MD or one of the following Advanced Practice Providers on your designated Care Team: [place], [place] . Micah Flesher, PA-C  Any Other Special Instructions Will Be Listed Below (If Applicable).   SUGGESTION TO USE NEUTRAL WRIST SPLIT FOR INFREQUENT WRIST PAIN

## 2018-09-02 NOTE — ED Provider Notes (Signed)
[facility] EMERGENCY DEPARTMENT Provider Note   CSN: [id number] Arrival date & time: 09/02/18  1742    History   Chief Complaint Chief Complaint  Patient presents with  . Fall  . Facial Laceration    HPI Emily Farmer is a 63 y.o. female.     Patient on apixaban due to atrial fibrillation presents after trip and fall occurring this morning around 10 AM.  Patient states that she was leaving a doctor's appointment when she tripped on a curb and fell striking the right side of her face.  She sustained a small laceration that continues to bleed.  She has a mild headache.  No vision change or loss.  No nausea or vomiting.  She sustained small abrasions to her right hand and right knee but is able to walk without difficulty.  No other treatments.  She did not lose consciousness.  She is here tonight mainly because the bleeding would not stop.  The onset of this condition was acute. The course is constant. Aggravating factors: none. Alleviating factors: none.       Past Medical History:  Diagnosis Date  . Arthritis   . Atrial fibrillation ([place])    a. 09/2016 in setting of pancreatitis;  b. 09/2016 Echo: EF 65-60%, no rwma, Gr2 DD, mild MR, triv TR, PASP 6mHg;  CHA2DS2VASc = 4-->Eliquis 561mBID. c. Recurred 06/2018 in setting of GIB.  . Marland Kitchenhronic diastolic CHF (congestive heart failure) (HCSkamania8/15/2019  . Diabetes mellitus   . GI bleeding 06/2018  . Gout   . Hyperlipidemia   . Hypertension   . Hypertriglyceridemia   . Pancreatitis    a. 09/2016 - Triglycerides 1,392 on admission.    Patient Active Problem List   Diagnosis Date Noted  . Toenail fungus 07/19/2018  . Depression 07/19/2018  . Iron deficiency anemia due to chronic blood loss 07/19/2018  . Gastritis and duodenitis 07/19/2018  . Atrial fibrillation with slow ventricular response (HC[facility]08/15/2019  . Hyponatremia 01/28/2018  . Chronic diastolic CHF (congestive heart failure) (HCMc[facility]08/15/2019  . Mixed  hyperlipidemia 06/01/2017  . Paroxysmal A-fib ([facility]CR[place]04/21/2018  . Essential hypertension 02/15/2013  . Hypertriglyceridemia 02/15/2013  . Diabetes mellitus type 2 in obese (HC[facility]arks [place]09/07/2012  . Coronary atherosclerosis seen on CT 02/15/2013    Past Surgical History:  Procedure Laterality Date  . BIOPSY  06/20/2018   Procedure: BIOPSY;  Surgeon: MaClarene EssexMD;  [facility];  Service: Endoscopy;;  . CHOLECYSTECTOMY N/A 01/04/2014   Procedure: LAPAROSCOPIC CHOLECYSTECTOMY WITH INTRAOPERATIVE CHOLANGIOGRAM;  Surgeon: ArRalene OkMD;  [facility] Service: General;  Laterality: N/A;  . COLONOSCOPY WITH PROPOFOL N/A 06/21/2018   Procedure: COLONOSCOPY WITH PROPOFOL;  Surgeon: BuRonald LoboMD;  [facility] Service: Endoscopy;  Laterality: N/A;  . ESOPHAGOGASTRODUODENOSCOPY (EGD) WITH PROPOFOL N/A 06/20/2018   Procedure: ESOPHAGOGASTRODUODENOSCOPY (EGD) WITH PROPOFOL;  Surgeon: MaClarene EssexMD;  [facility] Service: End[facility];  Laterality: N/A;  . LUNG SURGERY       OB History   No obstetric history on file.      Home Medications    Prior to Admission medications   Medication Sig Start Date End Date Taking? Authorizing Provider  acetaminophen (TYLENOL) 325 MG tablet Take 2 tablets (650 mg total) by mouth every 6 (six) hours as needed for mild pain (or Fever >/= 101). 02/01/18   Elgergawy, DaSilver HugueninMD  amiodarone (PACERONE) 200 MG tablet Take 0.5 tablets (100 mg total)  by mouth daily. 09/02/18   Hilty, Nadean Corwin, MD  apixaban (ELIQUIS) 5 MG TABS tablet Take 1 tablet (5 mg total) by mouth 2 (two) times daily. 05/11/18   Hilty, Nadean Corwin, MD  atorvastatin (LIPITOR) 40 MG tablet Take 1 tablet (40 mg total) by mouth daily. 05/11/18   Hilty, Nadean Corwin, MD  Blood Glucose Monitoring Suppl (TRUE METRIX METER) w/Device KIT Use to monitor blood sugar as directed 02/22/18   Fulp, Cammie, MD  cetirizine (ZYRTEC) 10 MG tablet Take 1 tablet (10 mg total) by mouth  daily. 07/19/18   Elsie Stain, MD  dicyclomine (BENTYL) 20 MG tablet Take 1 tablet (20 mg total) by mouth 2 (two) times daily as needed for spasms. 07/19/18   Elsie Stain, MD  ferrous sulfate 325 (65 FE) MG tablet Take 1 tablet (325 mg total) by mouth 2 (two) times daily with a meal. 07/19/18   Elsie Stain, MD  glucose blood (TRUE METRIX BLOOD GLUCOSE TEST) test strip Use as instructed to check blood sugars 3 times per day 02/22/18   Fulp, Cammie, MD  Insulin Glargine (LANTUS SOLOSTAR) 100 UNIT/ML Solostar Pen Inject 30 Units into the skin at bedtime. 07/19/18   Elsie Stain, MD  Insulin Pen Needle (TRUEPLUS PEN NEEDLES) 32G X 4 MM MISC Use with insulin pen to inject Lantus daily. 03/22/18   Fulp, Cammie, MD  Insulin Syringe-Needle U-100 (TRUEPLUS INSULIN SYRINGE) 31G X 5/16" 0.3 ML MISC Use to inject insulin daily. 05/06/18   Fulp, Cammie, MD  metFORMIN (GLUCOPHAGE) 500 MG tablet Take 2 tablets (1,000 mg total) by mouth 2 (two) times daily with a meal. 07/19/18   Elsie Stain, MD  Multiple Vitamins-Minerals (CENTRUM SILVER PO) Take 1 tablet by mouth daily.    [provider]  mupirocin ointment (BACTROBAN) 2 % Place 1 application into the nose 2 (two) times daily. 07/19/18   Elsie Stain, MD  ondansetron (ZOFRAN-ODT) 4 MG disintegrating tablet Take 4 mg by mouth every 8 (eight) hours as needed for nausea or vomiting.    [provider]  pantoprazole (PROTONIX) 40 MG tablet Take 1 tablet (40 mg total) by mouth daily. 07/19/18   Elsie Stain, MD  traMADol (ULTRAM) 50 MG tablet Take 1 tablet (50 mg total) by mouth every 8 (eight) hours as needed for moderate pain. 08/17/18   Fulp, Ander Gaster, MD  TRUEPLUS LANCETS 28G MISC Use to check blood sugar 3 times per day 02/22/18   Antony Blackbird, MD    Family History Family History  Problem Relation Age of Onset  . Heart attack Mother   . Heart attack Father   . Diabetes Sister   . High blood pressure Neg Hx   . High  Cholesterol Neg Hx     Social History Social History   Tobacco Use  . Smoking status: Never Smoker  . Smokeless tobacco: Never Used  Substance Use Topics  . Alcohol use: No  . Drug use: No     Allergies   Patient has no known allergies.   Review of Systems Review of Systems  Constitutional: Negative for fatigue.  HENT: Negative for tinnitus.   Eyes: Negative for photophobia, pain and visual disturbance.  Respiratory: Negative for shortness of breath.   Cardiovascular: Negative for chest pain.  Gastrointestinal: Negative for nausea and vomiting.  Musculoskeletal: Positive for arthralgias. Negative for back pain, gait problem and neck pain.  Skin: Negative for wound.  Neurological: Positive for headaches.  Negative for dizziness, weakness, light-headedness and numbness.  Psychiatric/Behavioral: Negative for confusion and decreased concentration.     Physical Exam Updated Vital Signs BP (!) 168/74 (BP Location: Right Arm)   Pulse 68   Temp 98.1 F (36.7 C) (Oral)   Resp 16   Wt 73.9 kg   SpO2 96%   BMI 28.87 kg/m   Physical Exam Vitals signs and nursing note reviewed.  Constitutional:      Appearance: She is well-developed.  HENT:     Head: Normocephalic. No raccoon eyes or Battle's sign.     Comments: 49m non-gaping laceration lateral to R eye, persistent slight oozing of blood.     Right Ear: Tympanic membrane, ear canal and external ear normal. No hemotympanum.     Left Ear: Tympanic membrane, ear canal and external ear normal. No hemotympanum.     Nose: Nose normal.     Mouth/Throat:     Pharynx: Uvula midline.  Eyes:     General: Lids are normal.     Extraocular Movements:     Right eye: No nystagmus.     Left eye: No nystagmus.     Conjunctiva/sclera: Conjunctivae normal.     Pupils: Pupils are equal, round, and reactive to light.     Comments: No visible hyphema noted  Neck:     Musculoskeletal: Normal range of motion and neck supple.   Cardiovascular:     Rate and Rhythm: Normal rate and regular rhythm.  Pulmonary:     Effort: Pulmonary effort is normal.     Breath sounds: Normal breath sounds.  Abdominal:     Palpations: Abdomen is soft.     Tenderness: There is no abdominal tenderness.  Musculoskeletal:     Right elbow: Normal.    Right wrist: Normal.     Right hip: Normal.     Right knee: She exhibits normal range of motion, no swelling and no effusion. Tenderness found.     Cervical back: She exhibits normal range of motion, no tenderness and no bony tenderness.     Thoracic back: She exhibits no tenderness and no bony tenderness.     Lumbar back: She exhibits no tenderness and no bony tenderness.     Right hand: Normal. She exhibits normal range of motion and no tenderness.       Hands:       Legs:  Skin:    General: Skin is warm and dry.  Neurological:     Mental Status: She is alert and oriented to person, place, and time.     GCS: GCS eye subscore is 4. GCS verbal subscore is 5. GCS motor subscore is 6.     Cranial Nerves: No cranial nerve deficit.     Sensory: No sensory deficit.     Coordination: Coordination normal.     Deep Tendon Reflexes: Reflexes are normal and symmetric.      ED Treatments / Results  Labs (all labs ordered are listed, but only abnormal results are displayed) Labs Reviewed - No data to display  EKG None  Radiology Ct Head Wo Contrast  Result Date: 09/02/2018 CLINICAL DATA:  Pain following fall EXAM: CT HEAD WITHOUT CONTRAST TECHNIQUE: Contiguous axial images were obtained from the base of the skull through the vertex without intravenous contrast. COMPARISON:  June 25, 2018 FINDINGS: Brain: There is age related volume loss. There is no appreciable intracranial mass, hemorrhage, extra-axial fluid collection, or midline shift. Brain parenchyma appears unremarkable. No acute  infarct evident. Vascular: No hyperdense vessel. There is calcification in each carotid siphon  region. Skull: The bony calvarium appears intact. Sinuses/Orbits: There is rightward deviation of the nasal septum. There is mild mucosal thickening in several ethmoid air cells. Orbits appear symmetric bilaterally. Other: Mastoid air cells are clear. IMPRESSION: Age related volume loss. Brain parenchyma appears unremarkable. No mass or hemorrhage. There are foci of arterial vascular calcification. There is mild mucosal thickening in several ethmoid air cells. There is rightward deviation of the nasal septum. Electronically Signed   By: Lowella Grip III M.D.   On: 09/02/2018 18:26    Procedures .Marland KitchenLaceration Repair Date/Time: 09/02/2018 7:31 PM Performed by: Carlisle Cater, PA-C Authorized by: Carlisle Cater, PA-C   Consent:    Consent obtained:  Verbal   Consent given by:  Patient   Risks discussed:  Pain   Alternatives discussed:  No treatment Anesthesia (see MAR for exact dosages):    Anesthesia method:  Local infiltration   Local anesthetic:  Lidocaine 2% WITH epi Laceration details:    Location:  Face   Face location:  R cheek   Length (cm):  0.5 Pre-procedure details:    Preparation:  Patient was prepped and draped in usual sterile fashion Exploration:    Wound exploration: wound explored through full range of motion     Contaminated: no   Treatment:    Area cleansed with:  Saline   Amount of cleaning:  Standard Skin repair:    Repair method:  Sutures   Suture size:  6-0   Suture material:  Nylon   Number of sutures:  1 Approximation:    Approximation:  Close Post-procedure details:    Dressing:  Open (no dressing)   Patient tolerance of procedure:  Tolerated well, no immediate complications   (including critical care time)  Medications Ordered in ED Medications  lidocaine-EPINEPHrine (XYLOCAINE W/EPI) 2 %-1:200000 (PF) injection 10 mL (has no administration in time range)     Initial Impression / Assessment and Plan / ED Course  I have reviewed the triage  vital signs and the nursing notes.  Pertinent labs & imaging results that were available during my care of the patient were reviewed by me and considered in my medical decision making (see chart for details).        Patient seen and examined. Head CT 2/2 anticoagulation. Will place suture to help stop bleeding from small orbit lac.   Vital signs reviewed and are as follows: BP (!) 168/74 (BP Location: Right Arm)   Pulse 68   Temp 98.1 F (36.7 C) (Oral)   Resp 16   Wt 73.9 kg   SpO2 96%   BMI 28.87 kg/m   7:30 PM CT imaging negative.  Patient updated.  Wound repaired without complication.  Will recheck.  8:06 PM bleeding stopped.  Patient comfortable at discharge.  We will discharged home.  Final Clinical Impressions(s) / ED Diagnoses   Final diagnoses:  Injury of head, initial encounter  Facial laceration, initial encounter   Patient with head injury on anticoagulation.  Symptoms occurred 8 hours prior to exam.  She does not have any concerning symptoms for decompensation.  Head CT is negative.  She came in tonight because she had a small laceration which had persistent oozing.  This was closed with a stitch with good hemostasis.  Ready for discharge.  ED Discharge Orders    None       Carlisle Cater, [place] 09/02/18 2007  Julianne Rice, MD 09/03/18 1736

## 2018-09-07 MED FILL — AMIODARONE HCL 200 MG TAB: 200 | 30 days supply | Qty: 15 | Fill #0

## 2018-10-01 MED FILL — ELIQUIS 5 MG TABLET: 5 | 30 days supply | Qty: 60 | Fill #3

## 2018-10-01 MED FILL — FERROUS SULFATE 325 MG TAB: 325 (65 FE) | 30 days supply | Qty: 60 | Fill #1

## 2018-10-01 MED FILL — AMIODARONE HCL 200 MG TAB: 200 | 30 days supply | Qty: 15 | Fill #1

## 2018-10-01 MED FILL — traMADol HCL 50 MG TABS: 50 | 10 days supply | Qty: 30 | Fill #1

## 2018-10-01 MED FILL — ATORVASTATIN CALCIUM 40 MG: 40 | 30 days supply | Qty: 30 | Fill #3

## 2018-10-01 MED FILL — $LANTUS SOLOSTAR 100 UNITS/: 100 | 50 days supply | Qty: 15 | Fill #2

## 2018-10-18 MED FILL — AMIODARONE HCL 200 MG TAB: 200 | 30 days supply | Qty: 15 | Fill #2

## 2018-11-01 MED FILL — METOPROLOL TARTRATE 25 MG T: 25 | 30 days supply | Qty: 90 | Fill #3

## 2018-11-04 MED FILL — ATORVASTATIN CALCIUM 40 MG: 40 | 30 days supply | Qty: 30 | Fill #4

## 2018-12-06 MED FILL — ELIQUIS 5 MG TABLET: 5 | 30 days supply | Qty: 60 | Fill #4

## 2018-12-06 MED FILL — AMIODARONE HCL 200 MG TAB: 200 | 30 days supply | Qty: 15 | Fill #3

## 2018-12-06 MED FILL — ?ATORVASTATIN 40MG TABLET: 40 | 30 days supply | Qty: 30 | Fill #5

## 2018-12-06 MED FILL — $LANTUS SOLOSTAR 100 UNITS/: 100 | 20 days supply | Qty: 6 | Fill #3

## 2018-12-06 MED FILL — FERROUS SULFATE 325 MG TAB: 325 (65 FE) | 30 days supply | Qty: 60 | Fill #2

## 2019-01-10 MED FILL — ELIQUIS 5 MG TABLET: 5 | 30 days supply | Qty: 60 | Fill #5

## 2019-01-10 MED FILL — AMIODARONE HCL 200 MG TAB: 200 | 30 days supply | Qty: 15 | Fill #4

## 2019-01-10 MED FILL — METOPROLOL TARTRATE 25 MG T: 25 | 30 days supply | Qty: 90 | Fill #4

## 2019-01-10 MED FILL — ?ATORVASTATIN 40MG TABLET: 40 | 30 days supply | Qty: 30 | Fill #6

## 2019-01-20 ENCOUNTER — Ambulatory Visit: Payer: Medicaid Other | Admitting: Family Medicine

## 2019-02-07 MED FILL — AMIODARONE HCL 200 MG TAB: 200 | 30 days supply | Qty: 15 | Fill #5

## 2019-02-23 ENCOUNTER — Ambulatory Visit: Payer: Medicaid Other | Admitting: Family Medicine

## 2019-02-23 ENCOUNTER — Telehealth: Payer: Self-pay | Admitting: Internal Medicine

## 2019-02-23 NOTE — Telephone Encounter (Signed)
Virtual Visit Pre-Appointment Phone Call  "(Name), I am calling you today to discuss your upcoming appointment. We are currently trying to limit exposure to the virus that causes COVID-19 by seeing patients at home rather than in the office."  1. "What is the BEST phone number to call the day of the visit?" - [phone]  2. "Do you have or have access to (through a family member/friend) a smartphone with video capability that we can use for your visit?" a. If no - list the appointment type as a PHONE visit in appointment notes  3. Confirm consent - "In the setting of the current Covid19 crisis, you are scheduled for a (phone or video) visit with your provider on (date) at (time).  Just as we do with many in-office visits, in order for you to participate in this visit, we must obtain consent.  If you'd like, I can send this to your mychart (if signed up) or email for you to review.  Otherwise, I can obtain your verbal consent now.  All virtual visits are billed to your insurance company just like a normal visit would be.  By agreeing to a virtual visit, we'd like you to understand that the technology does not allow for your provider to perform an examination, and thus may limit your provider's ability to fully assess your condition. If your provider identifies any concerns that need to be evaluated in person, we will make arrangements to do so.  Finally, though the technology is pretty good, we cannot assure that it will always work on either your or our end, and in the setting of a video visit, we may have to convert it to a phone-only visit.  In either situation, we cannot ensure that we have a secure connection.  Are you willing to proceed?" STAFF: Did the patient verbally acknowledge consent to telehealth visit? Document YES/NO here: YES  4. Advise patient to be prepared - "Two hours prior to your appointment, go ahead and check your blood pressure, pulse, oxygen saturation, and your weight (if  you have the equipment to check those) and write them all down. When your visit starts, your provider will ask you for this information. If you have an Apple Watch or Kardia device, please plan to have heart rate information ready on the day of your appointment. Please have a pen and paper handy nearby the day of the visit as well."  5. Give patient instructions for MyChart download to smartphone OR [email or web address] as below if video visit (depending on what platform provider is using)  6. Inform patient they will receive a phone call 15 minutes prior to their appointment time (may be from unknown caller ID) so they should be prepared to answer    TELEPHONE CALL NOTE  Emily Farmer has been deemed a candidate for a follow-up tele-health visit to limit community exposure during the Covid-19 pandemic. I spoke with the patient via phone to ensure availability of phone/video source, confirm preferred email & phone number, and discuss instructions and expectations.  I reminded Emily Farmer to be prepared with any vital sign and/or heart rhythm information that could potentially be obtained via home monitoring, at the time of her visit. I reminded Emily Farmer to expect a phone call prior to her visit.  Emily Levy, RN 02/23/2019 4:24 PM     FULL LENGTH CONSENT FOR TELE-HEALTH VISIT   I hereby voluntarily request, consent and authorize [facility] and its employed or contracted physicians,  physician assistants, nurse practitioners or other licensed health care professionals (the Practitioner), to provide me with telemedicine health care services (the "Services") as deemed necessary by the treating Practitioner. I acknowledge and consent to receive the Services by the Practitioner via telemedicine. I understand that the telemedicine visit will involve communicating with the Practitioner through live audiovisual communication technology and the disclosure of certain medical information by  electronic transmission. I acknowledge that I have been given the opportunity to request an in-person assessment or other available alternative prior to the telemedicine visit and am voluntarily participating in the telemedicine visit.  I understand that I have the right to withhold or withdraw my consent to the use of telemedicine in the course of my care at any time, without affecting my right to future care or treatment, and that the Practitioner or I may terminate the telemedicine visit at any time. I understand that I have the right to inspect all information obtained and/or recorded in the course of the telemedicine visit and may receive copies of available information for a reasonable fee.  I understand that some of the potential risks of receiving the Services via telemedicine include:  Marland Kitchen Delay or interruption in medical evaluation due to technological equipment failure or disruption; . Information transmitted may not be sufficient (e.g. poor resolution of images) to allow for appropriate medical decision making by the Practitioner; and/or  . In rare instances, security protocols could fail, causing a breach of personal health information.  Furthermore, I acknowledge that it is my responsibility to provide information about my medical history, conditions and care that is complete and accurate to the best of my ability. I acknowledge that Practitioner's advice, recommendations, and/or decision may be based on factors not within their control, such as incomplete or inaccurate data provided by me or distortions of diagnostic images or specimens that may result from electronic transmissions. I understand that the practice of medicine is not an exact science and that Practitioner makes no warranties or guarantees regarding treatment outcomes. I acknowledge that I will receive a copy of this consent concurrently upon execution via email to the email address I last provided but may also request a printed  copy by calling the office of [facility].    I understand that my insurance will be billed for this visit.   I have read or had this consent read to me. . I understand the contents of this consent, which adequately explains the benefits and risks of the Services being provided via telemedicine.  . I have been provided ample opportunity to ask questions regarding this consent and the Services and have had my questions answered to my satisfaction. . I give my informed consent for the services to be provided through the use of telemedicine in my medical care  By participating in this telemedicine visit I agree to the above.

## 2019-03-01 ENCOUNTER — Telehealth (INDEPENDENT_AMBULATORY_CARE_PROVIDER_SITE_OTHER): Payer: Self-pay | Admitting: Internal Medicine

## 2019-03-01 ENCOUNTER — Encounter: Payer: Self-pay | Admitting: Internal Medicine

## 2019-03-01 VITALS — BP 106/76 | HR 41

## 2019-03-01 DIAGNOSIS — I480 Paroxysmal atrial fibrillation: Secondary | ICD-10-CM

## 2019-03-01 DIAGNOSIS — E782 Mixed hyperlipidemia: Secondary | ICD-10-CM

## 2019-03-01 DIAGNOSIS — I10 Essential (primary) hypertension: Secondary | ICD-10-CM

## 2019-03-01 DIAGNOSIS — Z79899 Other long term (current) drug therapy: Secondary | ICD-10-CM

## 2019-03-01 NOTE — Patient Instructions (Signed)
Medication Instructions:  STOP amiodarone Continue other current medications  If you need a refill on your cardiac medications before your next appointment, please call your pharmacy.    Follow-Up: At [facility], you and your health needs are our priority.  As part of our continuing mission to provide you with exceptional heart care, we have created designated Provider Care Teams.  These Care Teams include your primary Cardiologist (physician) and Advanced Practice Providers (APPs -  Physician Assistants and Nurse Practitioners) who all work together to provide you with the care you need, when you need it. You will need a follow up appointment in 12 months.  Please call our office 2 months in advance to schedule this appointment.  You may see Pixie Casino, MD or one of the following Advanced Practice Providers on your designated Care Team: [place], [place] . Fabian Sharp, PA-C  Any Other Special Instructions Will Be Listed Below (If Applicable).

## 2019-03-01 NOTE — Progress Notes (Signed)
Virtual Visit via Telephone Note   This visit type was conducted due to national recommendations for restrictions regarding the COVID-19 Pandemic (e.g. social distancing) in an effort to limit this patient's exposure and mitigate transmission in our community.  Due to her co-morbid illnesses, this patient is at least at moderate risk for complications without adequate follow up.  This format is felt to be most appropriate for this patient at this time.  The patient did not have access to video technology/had technical difficulties with video requiring transitioning to audio format only (telephone).  All issues noted in this document were discussed and addressed.  No physical exam could be performed with this format.  Please refer to the patient's chart for her  consent to telehealth for [facility].   Evaluation Performed:  Telephone follow-up  Date:  03/01/2019   ID:  Emily Farmer, DOB September 14, 1955, MRN [id number]  Patient Location:  [place] [address]  Provider location:   [address], [place], [place] [address]  PCP:  Antony Blackbird, MD  Cardiologist:  Pixie Casino, MD Electrophysiologist:  None   Chief Complaint:  No complaints  History of Present Illness:    Emily Farmer is a 63 y.o. female who presents via audio/video conferencing for a telehealth visit today.  [name] is a 63 year old female with paroxysmal atrial fibrillation in the setting of pancreatitis.  She had some recurrent atrial fibrillation in the setting of GI bleeding and was on amiodarone for that.  Recently she has had bradycardia and hypotension and I have decreased the dose of it.  Currently she is on 100 mg daily.  Blood pressure today was in the low 165V systolic with heart rates in the low 40s.  She denies any worsening shortness of breath or fatigue associated with this however the lower heart rate and blood pressure is concerning.  She is on no other agents to  lower heart rate or blood pressure.  She also has dyslipidemia and type 2 diabetes and has been followed by Dr. Joya Gaskins at [facility] health [facility] wellness.  She had a recent mechanical fall this past spring requiring a few stitches but otherwise has done pretty well.  The patient does not have symptoms concerning for COVID-19 infection (fever, chills, cough, or new SHORTNESS OF BREATH).    Prior CV studies:   The following studies were reviewed today:  Chart reviewed  PMHx:  Past Medical History:  Diagnosis Date  . Arthritis   . Atrial fibrillation ([place])    a. 09/2016 in setting of pancreatitis;  b. 09/2016 Echo: EF 65-60%, no rwma, Gr2 DD, mild MR, triv TR, PASP 64mHg;  CHA2DS2VASc = 4-->Eliquis 545mBID. c. Recurred 06/2018 in setting of GIB.  . Marland Kitchenhronic diastolic CHF (congestive heart failure) ([facility]8/15/2019  . Diabetes mellitus   . GI bleeding 06/2018  . Gout   . Hyperlipidemia   . Hypertension   . Hypertriglyceridemia   . Pancreatitis    a. 09/2016 - Triglycerides 1,392 on admission.    Past Surgical History:  Procedure Laterality Date  . BIOPSY  06/20/2018   Procedure: BIOPSY;  Surgeon: MaClarene EssexMD;  [facility];  Service: Endoscopy;;  . CHOLECYSTECTOMY N/A 01/04/2014   Procedure: LAPAROSCOPIC CHOLECYSTECTOMY WITH INTRAOPERATIVE CHOLANGIOGRAM;  Surgeon: ArRalene OkMD;  [facility]: General;  Laterality: N/A;  . COLONOSCOPY WITH PROPOFOL N/A 06/21/2018   Procedure: COLONOSCOPY WITH PROPOFOL;  Surgeon: BuRonald Lobo  MD;  Location: [place] ENDOSCOPY;  Service: Endoscopy;  Laterality: N/A;  . ESOPHAGOGASTRODUODENOSCOPY (EGD) WITH PROPOFOL N/A 06/20/2018   Procedure: ESOPHAGOGASTRODUODENOSCOPY (EGD) WITH PROPOFOL;  Surgeon: Clarene Essex, MD;  Location: [place];  Service: Endoscopy;  Laterality: N/A;  . LUNG SURGERY      FAMHx:  Family History  Problem Relation Age of Onset  . Heart attack Mother   . Heart attack Father   . Diabetes Sister   . High  blood pressure Neg Hx   . High Cholesterol Neg Hx     SOCHx:   reports that she has never smoked. She has never used smokeless tobacco. She reports that she does not drink alcohol or use drugs.  ALLERGIES:  No Known Allergies  MEDS:  Current Meds  Medication Sig  . acetaminophen (TYLENOL) 325 MG tablet Take 2 tablets (650 mg total) by mouth every 6 (six) hours as needed for mild pain (or Fever >/= 101).  Marland Kitchen acetaminophen (TYLENOL) 500 MG tablet Take 1,000 mg by mouth every 6 (six) hours as needed for moderate pain.  Marland Kitchen amiodarone (PACERONE) 200 MG tablet Take 0.5 tablets (100 mg total) by mouth daily.  Marland Kitchen apixaban (ELIQUIS) 5 MG TABS tablet Take 1 tablet (5 mg total) by mouth 2 (two) times daily.  Marland Kitchen atorvastatin (LIPITOR) 40 MG tablet Take 1 tablet (40 mg total) by mouth daily.  . Blood Glucose Monitoring Suppl (TRUE METRIX METER) w/Device KIT Use to monitor blood sugar as directed  . cetirizine (ZYRTEC) 10 MG tablet Take 1 tablet (10 mg total) by mouth daily.  Marland Kitchen dicyclomine (BENTYL) 20 MG tablet Take 1 tablet (20 mg total) by mouth 2 (two) times daily as needed for spasms.  . ferrous sulfate 325 (65 FE) MG tablet Take 1 tablet (325 mg total) by mouth 2 (two) times daily with a meal.  . glucose blood (TRUE METRIX BLOOD GLUCOSE TEST) test strip Use as instructed to check blood sugars 3 times per day  . Insulin Glargine (LANTUS SOLOSTAR) 100 UNIT/ML Solostar Pen Inject 30 Units into the skin at bedtime.  . Insulin Pen Needle (TRUEPLUS PEN NEEDLES) 32G X 4 MM MISC Use with insulin pen to inject Lantus daily.  . Insulin Syringe-Needle U-100 (TRUEPLUS INSULIN SYRINGE) 31G X 5/16" 0.3 ML MISC Use to inject insulin daily.  . metFORMIN (GLUCOPHAGE) 500 MG tablet Take 2 tablets (1,000 mg total) by mouth 2 (two) times daily with a meal.  . Multiple Vitamins-Minerals (CENTRUM SILVER PO) Take 1 tablet by mouth daily.  . mupirocin ointment (BACTROBAN) 2 % Place 1 application into the nose 2 (two) times  daily.  . pantoprazole (PROTONIX) 40 MG tablet Take 1 tablet (40 mg total) by mouth daily.  . TRUEPLUS LANCETS 28G MISC Use to check blood sugar 3 times per day  . [DISCONTINUED] traMADol (ULTRAM) 50 MG tablet Take 1 tablet (50 mg total) by mouth every 8 (eight) hours as needed for moderate pain.     ROS: Pertinent items noted in HPI and remainder of comprehensive ROS otherwise negative.  Labs/Other Tests and Data Reviewed:    Recent Labs: 07/19/2018: Magnesium 2.8 09/02/2018: ALT 23; BUN 36; Creatinine, Ser 0.91; Hemoglobin 11.2; Platelets 254; Potassium 5.1; Sodium 140; TSH 2.870   Recent Lipid Panel Lab Results  Component Value Date/Time   CHOL 187 03/22/2018 10:53 AM   TRIG 163 (H) 03/22/2018 10:53 AM   HDL 49 03/22/2018 10:53 AM   CHOLHDL 3.8 03/22/2018 10:53 AM   CHOLHDL 8.8 (  H) 11/21/2016 08:22 AM   LDLCALC 105 (H) 03/22/2018 10:53 AM   LDLDIRECT 211 (H) 05/04/2017 08:42 AM   LDLDIRECT 170 (H) 11/21/2016 08:22 AM    Wt Readings from Last 3 Encounters:  09/02/18 163 lb (73.9 kg)  09/02/18 163 lb (73.9 kg)  08/17/18 164 lb (74.4 kg)     Exam:    Vital Signs:  BP 106/76   Pulse (!) 41    Exam not performed due to telephone visit  ASSESSMENT & PLAN:    1. Hyperchylomicronemia 2. Pancreatitis secondary to problem #1 3. Paroxysmal atrial fibrillation-CHADSVASC score 4 4. Type 2 diabetes on insulin 5. Essential hypertension 6. Possible carpal tunnel syndrome 7. Iron deficient anemia  Ms. Amison has had bradycardia and hypotension requiring decreases in the doses of her amiodarone.  She has had 2 episodes of recurrent atrial fibrillation, both in the setting of pancreatitis and GI bleed.  At this point I feel we will need to discontinue amiodarone.  I would not recommend antiarrhythmic therapy alternative or beta-blocker due to bradycardia and hypotension.  We will have to monitor for recurrence.  She should remain on anticoagulation.  Her last lipid profile in October  2019 showed total cholesterol 187, HDL 49, LDL 105 and triglycerides 163, which is somewhat improved.  COVID-19 Education: The signs and symptoms of COVID-19 were discussed with the patient and how to seek care for testing (follow up with PCP or arrange E-visit).  The importance of social distancing was discussed today.  Patient Risk:   After full review of this patients clinical status, I feel that they are at least moderate risk at this time.  Time:   Today, I have spent 25 minutes with the patient with telehealth technology discussing dyslipidemia, paroxysmal atrial fibrillation, hypotension and bradycardia.     Medication Adjustments/Labs and Tests Ordered: Current medicines are reviewed at length with the patient today.  Concerns regarding medicines are outlined above.   Tests Ordered: No orders of the defined types were placed in this encounter.   Medication Changes: No orders of the defined types were placed in this encounter.   Disposition:  in 1 year(s)  Pixie Casino, MD, [facility], [place] Director of the Advanced Lipid Disorders &  Cardiovascular Risk Reduction Clinic Diplomate of the American Board of Clinical Lipidology Attending Cardiologist  Direct Dial: [phone]  Fax: [phone]  Website:  [email or web address]  Pixie Casino, MD  03/01/2019 9:13 AM

## 2019-03-17 ENCOUNTER — Other Ambulatory Visit: Payer: Self-pay | Admitting: Physician Assistant

## 2019-03-17 MED FILL — $LANTUS SOLOSTAR 100 UNITS/: 100 | 30 days supply | Qty: 9 | Fill #1

## 2019-03-17 MED FILL — ELIQUIS 5 MG TABLET: 5 | 30 days supply | Qty: 60 | Fill #6

## 2019-04-13 ENCOUNTER — Encounter: Payer: Self-pay | Admitting: Family Medicine

## 2019-04-13 ENCOUNTER — Telehealth: Payer: Self-pay | Admitting: Family Medicine

## 2019-04-13 ENCOUNTER — Ambulatory Visit: Payer: Self-pay | Attending: Family Medicine | Admitting: Family Medicine

## 2019-04-13 ENCOUNTER — Other Ambulatory Visit: Payer: Self-pay

## 2019-04-13 VITALS — BP 125/76 | HR 98 | Temp 98.5°F | Resp 18 | Ht 63.0 in | Wt 174.0 lb

## 2019-04-13 DIAGNOSIS — I480 Paroxysmal atrial fibrillation: Secondary | ICD-10-CM

## 2019-04-13 DIAGNOSIS — I10 Essential (primary) hypertension: Secondary | ICD-10-CM

## 2019-04-13 DIAGNOSIS — Z79899 Other long term (current) drug therapy: Secondary | ICD-10-CM

## 2019-04-13 DIAGNOSIS — Z23 Encounter for immunization: Secondary | ICD-10-CM

## 2019-04-13 DIAGNOSIS — Z7901 Long term (current) use of anticoagulants: Secondary | ICD-10-CM

## 2019-04-13 DIAGNOSIS — E1169 Type 2 diabetes mellitus with other specified complication: Secondary | ICD-10-CM

## 2019-04-13 DIAGNOSIS — D500 Iron deficiency anemia secondary to blood loss (chronic): Secondary | ICD-10-CM

## 2019-04-13 DIAGNOSIS — Z794 Long term (current) use of insulin: Secondary | ICD-10-CM

## 2019-04-13 DIAGNOSIS — Z1231 Encounter for screening mammogram for malignant neoplasm of breast: Secondary | ICD-10-CM

## 2019-04-13 DIAGNOSIS — E119 Type 2 diabetes mellitus without complications: Secondary | ICD-10-CM

## 2019-04-13 DIAGNOSIS — E781 Pure hyperglyceridemia: Secondary | ICD-10-CM

## 2019-04-13 DIAGNOSIS — I5032 Chronic diastolic (congestive) heart failure: Secondary | ICD-10-CM

## 2019-04-13 DIAGNOSIS — E1165 Type 2 diabetes mellitus with hyperglycemia: Secondary | ICD-10-CM

## 2019-04-13 DIAGNOSIS — E669 Obesity, unspecified: Secondary | ICD-10-CM

## 2019-04-13 LAB — POCT GLYCOSYLATED HEMOGLOBIN (HGB A1C): Hemoglobin A1C: 13.9 % — AB (ref 4.0–5.6)

## 2019-04-13 LAB — GLUCOSE, POCT (MANUAL RESULT ENTRY): POC Glucose: 326 mg/dL — AB (ref 70–99)

## 2019-04-13 MED ORDER — LANTUS SOLOSTAR 100 UNIT/ML ~~LOC~~ SOPN: 30.0000 [IU] | PEN_INJECTOR | Freq: Every day | SUBCUTANEOUS | 4 refills | Status: DC

## 2019-04-13 MED ORDER — FERROUS SULFATE 325 (65 FE) MG PO TABS: 325.0000 mg | ORAL_TABLET | Freq: Two times a day (BID) | ORAL | 3 refills | Status: DC

## 2019-04-13 MED FILL — $LANTUS SOLOSTAR 100 UNITS/: 100 | 50 days supply | Qty: 15 | Fill #0

## 2019-04-13 MED FILL — FERROUS SULFATE 325 MG TAB: 325 (65 FE) | 30 days supply | Qty: 60 | Fill #0

## 2019-04-13 NOTE — Telephone Encounter (Signed)
netoprolol-tartrate 25 mg  Take 1 and 1/2 tablet by mouth twice daily

## 2019-04-13 NOTE — Telephone Encounter (Signed)
Patient called back with BP name and dosing

## 2019-04-13 NOTE — Progress Notes (Signed)
Established Patient Office Visit  Subjective:  Patient ID: Emily Farmer, female    DOB: 05-Jan-1956  Age: 63 y.o. MRN: [id number]  CC: Follow-up diabetes, chronic issues and medication refills none none  HPI Emily Farmer, 63 yo female, with poorly controlled Type 2 DM who is seen in follow-up. She was last seen by me in the office on 08/17/2018. She additionally has atrial fibrillation, chronic diastolic CHF, gout, history of GI bleed, HTN, Hyperlipidemia and history of pancreatitis due to hypertriglyceridemia. She is status post recent telehealth visit with cardiologist, Dr. Debara Pickett on 03/01/2019. Amiodarone was stopped due to patient having episodes of bradycardia and hypotension.          She reports that she feels better since the amiodarone was stopped. Less fatigued and less dizzy. She has been out of her Lantus for the past 2 weeks as well as some of her other medications.  Home blood sugars have been elevated in the 200s to 300s since being out of her Lantus.  She denies any increased thirst, she does have some urinary frequency.  She denies any issues with chest pain.  She occasionally feels sensation of palpitations from her atrial fibrillation.  She also has left-sided lateral chest wall pain status post a fall earlier in the year.  She has not yet had her mammogram due to issues with scheduling due to COVID-19.  She also has had difficulty obtaining a eye exam due to scheduling issues due to COVID-19.  She does not feel as if she has numbness or tingling in her feet.  She does have issues with fungal infections of her toenails and patient states that last night she felt as if the left second toenail was lifting up when she was filing her toenails therefore she put a Band-Aid on her toenail.  Past Medical History:  Diagnosis Date  . Arthritis   . Atrial fibrillation ([place])    a. 09/2016 in setting of pancreatitis;  b. 09/2016 Echo: EF 65-60%, no rwma, Gr2 DD, mild MR, triv TR, PASP 51mHg;   CHA2DS2VASc = 4-->Eliquis 540mBID. c. Recurred 06/2018 in setting of GIB.  . Marland Kitchenhronic diastolic CHF (congestive heart failure) ([facility]/15/2019  . Diabetes mellitus   . GI bleeding 06/2018  . Gout   . Hyperlipidemia   . Hypertension   . Hypertriglyceridemia   . Pancreatitis    a. 09/2016 - Triglycerides 1,392 on admission.    Past Surgical History:  Procedure Laterality Date  . BIOPSY  06/20/2018   Procedure: BIOPSY;  Surgeon: MaClarene EssexMD;  [facility];  Service: Endoscopy;;  . CHOLECYSTECTOMY N/A 01/04/2014   Procedure: LAPAROSCOPIC CHOLECYSTECTOMY WITH INTRAOPERATIVE CHOLANGIOGRAM;  Surgeon: ArRalene OkMD;  [facility] Service: General;  Laterality: N/A;  . COLONOSCOPY WITH PROPOFOL N/A 06/21/2018   Procedure: COLONOSCOPY WITH PROPOFOL;  Surgeon: BuRonald LoboMD;  [place] [facility] [place] [facility]: Endoscopy;  Laterality: N/A;  . ESOPHAGOGASTRODUODENOSCOPY (EGD) WITH PROPOFOL N/A 06/20/2018   Procedure: ESOPHAGOGASTRODUODENOSCOPY (EGD) WITH PROPOFOL;  Surgeon: MaClarene EssexMD;  [facility]: [facility];  Laterality: N/A;  . LUNG SURGERY      Family History  Problem Relation Age of Onset  . Heart attack Mother   . Heart attack Father   . Diabetes Sister   . High blood pressure Neg Hx   . High Cholesterol Neg Hx     Social History   Socioeconomic History  . Marital status: Single    Spouse  name: Not on file  . Number of children: Not on file  . Years of education: Not on file  . Highest education level: Not on file  Occupational History  . Not on file  Social Needs  . Financial resource strain: Not on file  . Food insecurity    Worry: Not on file    Inability: Not on file  . Transportation needs    Medical: Not on file    Non-medical: Not on file  Tobacco Use  . Smoking status: Never Smoker  . Smokeless tobacco: Never Used  Substance and Sexual Activity  . Alcohol use: No  . Drug use: No  . Sexual activity: Not Currently    Lifestyle  . Physical activity    Days per week: Not on file    Minutes per session: Not on file  . Stress: Not on file  Relationships  . Social Herbalist on phone: Not on file    Gets together: Not on file    Attends religious service: Not on file    Active member of club or organization: Not on file    Attends meetings of clubs or organizations: Not on file    Relationship status: Not on file  . Intimate partner violence    Fear of current or ex partner: Not on file    Emotionally abused: Not on file    Physically abused: Not on file    Forced sexual activity: Not on file  Other Topics Concern  . Not on file  Social History Narrative   Lives with her sister and does not need any assistance with ADLs.      Outpatient Medications Prior to Visit  Medication Sig Dispense Refill  . acetaminophen (TYLENOL) 325 MG tablet Take 2 tablets (650 mg total) by mouth every 6 (six) hours as needed for mild pain (or Fever >/= 101).    Marland Kitchen acetaminophen (TYLENOL) 500 MG tablet Take 1,000 mg by mouth every 6 (six) hours as needed for moderate pain.    Marland Kitchen apixaban (ELIQUIS) 5 MG TABS tablet Take 1 tablet (5 mg total) by mouth 2 (two) times daily. 60 tablet 11  . atorvastatin (LIPITOR) 40 MG tablet Take 1 tablet (40 mg total) by mouth daily. 90 tablet 3  . Blood Glucose Monitoring Suppl (TRUE METRIX METER) w/Device KIT Use to monitor blood sugar as directed 1 kit 0  . cetirizine (ZYRTEC) 10 MG tablet Take 1 tablet (10 mg total) by mouth daily. 30 tablet 2  . dicyclomine (BENTYL) 20 MG tablet Take 1 tablet (20 mg total) by mouth 2 (two) times daily as needed for spasms. 40 tablet 0  . ferrous sulfate 325 (65 FE) MG tablet Take 1 tablet (325 mg total) by mouth 2 (two) times daily with a meal. 60 tablet 2  . glucose blood (TRUE METRIX BLOOD GLUCOSE TEST) test strip Use as instructed to check blood sugars 3 times per day 100 each 11  . Insulin Glargine (LANTUS SOLOSTAR) 100 UNIT/ML Solostar Pen  Inject 30 Units into the skin at bedtime. 15 mL 4  . Insulin Pen Needle (TRUEPLUS PEN NEEDLES) 32G X 4 MM MISC Use with insulin pen to inject Lantus daily. 100 each 11  . Insulin Syringe-Needle U-100 (TRUEPLUS INSULIN SYRINGE) 31G X 5/16" 0.3 ML MISC Use to inject insulin daily. 100 each 11  . metFORMIN (GLUCOPHAGE) 500 MG tablet Take 2 tablets (1,000 mg total) by mouth 2 (two) times daily with  a meal. 120 tablet 2  . Multiple Vitamins-Minerals (CENTRUM SILVER PO) Take 1 tablet by mouth daily.    . mupirocin ointment (BACTROBAN) 2 % Place 1 application into the nose 2 (two) times daily. 30 g 0  . pantoprazole (PROTONIX) 40 MG tablet Take 1 tablet (40 mg total) by mouth daily. 30 tablet 2  . TRUEPLUS LANCETS 28G MISC Use to check blood sugar 3 times per day 100 each 11   No facility-administered medications prior to visit.     No Known Allergies  ROS Review of Systems  Constitutional: Positive for fatigue (Improving). Negative for chills and fever.  HENT: Negative for sore throat and trouble swallowing.   Eyes: Negative for photophobia and visual disturbance.  Respiratory: Negative for cough and shortness of breath.   Cardiovascular: Positive for palpitations (Occasional). Negative for chest pain and leg swelling.  Gastrointestinal: Negative for abdominal pain, constipation, diarrhea and nausea.  Endocrine: Positive for polyuria (Occasionally). Negative for polydipsia and polyphagia.  Genitourinary: Positive for frequency. Negative for dysuria.  Musculoskeletal: Positive for back pain (Occasionally). Negative for gait problem.  Neurological: Positive for dizziness (Improving). Negative for numbness and headaches.  Hematological: Negative for adenopathy. Does not bruise/bleed easily.  Psychiatric/Behavioral: Negative for self-injury and suicidal ideas.      Objective:    Physical Exam  Constitutional: She is oriented to person, place, and time. She appears well-developed and  well-nourished.  Neck: Normal range of motion. Neck supple. No JVD present. No thyromegaly present.  Cardiovascular:  Irregullry, irregular pulse at upper limits of normal  Pulmonary/Chest: Effort normal and breath sounds normal.  Abdominal: Soft. She exhibits no mass. There is no abdominal tenderness. There is no rebound and no guarding.  Musculoskeletal: Normal range of motion.        General: No tenderness or edema.  Lymphadenopathy:    She has no cervical adenopathy.  Neurological: She is alert and oriented to person, place, and time.  Skin: Skin is warm and dry.  onychomycoisis of great toenails bilaterally  Psychiatric: She has a normal mood and affect. Her behavior is normal.  Nursing note and vitals reviewed.   BP 125/76 (BP Location: Left Arm, Patient Position: Sitting, Cuff Size: Normal)   Pulse 98   Temp 98.5 F (36.9 C) (Oral)   Resp 18   Ht '5\' 3"'  (1.6 m)   Wt 174 lb (78.9 kg)   SpO2 99%   BMI 30.82 kg/m  Wt Readings from Last 3 Encounters:  09/02/18 163 lb (73.9 kg)  09/02/18 163 lb (73.9 kg)  08/17/18 164 lb (74.4 kg)     Health Maintenance Due  Topic Date Due  . PAP SMEAR-Modifier  03/27/1978  . MAMMOGRAM  01/29/2012  . INFLUENZA VACCINE  01/15/2019  . HEMOGLOBIN A1C  01/17/2019  . OPHTHALMOLOGY EXAM  01/28/2019  . URINE MICROALBUMIN  05/07/2019    There are no preventive care reminders to display for this patient.  Lab Results  Component Value Date   TSH 2.870 09/02/2018   Lab Results  Component Value Date   WBC 7.5 09/02/2018   HGB 11.2 09/02/2018   HCT 35.1 09/02/2018   MCV 83 09/02/2018   PLT 254 09/02/2018   Lab Results  Component Value Date   NA 140 09/02/2018   K 5.1 09/02/2018   CO2 21 09/02/2018   GLUCOSE 164 (H) 09/02/2018   BUN 36 (H) 09/02/2018   CREATININE 0.91 09/02/2018   BILITOT <0.2 09/02/2018   ALKPHOS 82  09/02/2018   AST 22 09/02/2018   ALT 23 09/02/2018   PROT 7.0 09/02/2018   ALBUMIN 4.6 09/02/2018   CALCIUM  9.6 09/02/2018   ANIONGAP 6 06/26/2018   Lab Results  Component Value Date   CHOL 187 03/22/2018   Lab Results  Component Value Date   HDL 49 03/22/2018   Lab Results  Component Value Date   LDLCALC 105 (H) 03/22/2018   Lab Results  Component Value Date   TRIG 163 (H) 03/22/2018   Lab Results  Component Value Date   CHOLHDL 3.8 03/22/2018   Lab Results  Component Value Date   HGBA1C 9.5 (H) 07/19/2018      Assessment & Plan:  1. Type 2 diabetes mellitus with hyperglycemia, with long-term current use of insulin (HCC) Hemoglobin A1c is elevated at 13.9.with glucose nonfasting of 326.  Discussed the importance of remaining compliant with medications, diet and monitoring of blood sugars.  Referral will be placed to ophthalmology for diabetic eye exam.  Patient will have blood work at today's visit including lipid panel, urinalysis and comprehensive metabolic panel.  Patient has followed up in the past with clinical pharmacist and she is encouraged to make follow-up.  She will be notified regarding further instructions regarding control of diabetes once additional blood work results received. - HgB A1c - Glucose (CBG) - POCT URINALYSIS DIP (CLINITEK) - Lipid panel - Comprehensive metabolic panel - Ambulatory referral to Ophthalmology  2. Essential Hypertension Patient's blood pressure stable and at goal of 130/80 or less.  Lipid panel at today's visit. - Lipid panel  3. Hypertriglyceridemia Patient with known hypertriglyceridemia and is currently on atorvastatin 40 mg.  Patient with some financial hurdles to eating a proper, low-fat and low carbohydrate diet.  Repeat lipid panel and comprehensive metabolic panel done at today's visit - Lipid panel - Comprehensive metabolic panel  4. Paroxysmal A-fib ([place]) Patient reports that her heart rate has been controlled as she has had no episodes of sudden increase in heart rate or sensation of severe palpitations.  She feels better  since starting amiodarone which was prescribed by cardiology.  5. Iron deficiency anemia due to chronic blood loss CBC in follow-up of iron deficiency anemia and patient is also on chronic anticoagulation with Eliquis secondary to paroxysmal atrial fibrillation - CBC  6. Chronic diastolic CHF (congestive heart failure) (HCC) Stable, continue follow-up with cardiology  7. Encounter for long-term (current) use of medications CMP in follow-up of long-term use of medications including atorvastatin for hypertriglyceridemia - Comprehensive metabolic panel  8. Need for immunization against influenza Influenza immunization offered which patient agreed to have done at today's visit.  Educational handout regarding influenza immunization provided   An After Visit Summary was printed and given to the patient.  Follow-up: Return in about 4 months (around 08/14/2019) for chronic issues and as needed.   Antony Blackbird, MD

## 2019-04-13 NOTE — Telephone Encounter (Signed)
Please add to her medication list and I will send in refill

## 2019-04-14 LAB — COMPREHENSIVE METABOLIC PANEL WITH GFR
ALT: 11 IU/L (ref 0–32)
AST: 9 IU/L (ref 0–40)
Albumin/Globulin Ratio: 1.7 (ref 1.2–2.2)
Albumin: 4.3 g/dL (ref 3.8–4.8)
Alkaline Phosphatase: 89 IU/L (ref 39–117)
BUN/Creatinine Ratio: 26 (ref 12–28)
BUN: 33 mg/dL — ABNORMAL HIGH (ref 8–27)
Bilirubin Total: <0.2 mg/dL (ref 0.0–1.2)
CO2: 23 mmol/L (ref 20–29)
Calcium: 9.6 mg/dL (ref 8.7–10.3)
Chloride: 91 mmol/L — ABNORMAL LOW (ref 96–106)
Creatinine, Ser: 1.27 mg/dL — ABNORMAL HIGH (ref 0.57–1.00)
GFR calc Af Amer: 52 mL/min/1.73 — ABNORMAL LOW
GFR calc non Af Amer: 45 mL/min/1.73 — ABNORMAL LOW
Globulin, Total: 2.6 g/dL (ref 1.5–4.5)
Glucose: 307 mg/dL — ABNORMAL HIGH (ref 65–99)
Potassium: 4.3 mmol/L (ref 3.5–5.2)
Sodium: 131 mmol/L — ABNORMAL LOW (ref 134–144)
Total Protein: 6.9 g/dL (ref 6.0–8.5)

## 2019-04-14 LAB — CBC
Hematocrit: 36.8 % (ref 34.0–46.6)
Hemoglobin: 11.6 g/dL (ref 11.1–15.9)
MCH: 28 pg (ref 26.6–33.0)
MCHC: 31.5 g/dL (ref 31.5–35.7)
MCV: 89 fL (ref 79–97)
Platelets: 276 x10E3/uL (ref 150–450)
RBC: 4.14 x10E6/uL (ref 3.77–5.28)
RDW: 12.9 % (ref 11.7–15.4)
WBC: 9.1 x10E3/uL (ref 3.4–10.8)

## 2019-04-14 LAB — LIPID PANEL
Chol/HDL Ratio: 8.5 ratio — ABNORMAL HIGH (ref 0.0–4.4)
Cholesterol, Total: 347 mg/dL — ABNORMAL HIGH (ref 100–199)
HDL: 41 mg/dL
LDL Chol Calc (NIH): 150 mg/dL — ABNORMAL HIGH (ref 0–99)
Triglycerides: 774 mg/dL (ref 0–149)
VLDL Cholesterol Cal: 156 mg/dL — ABNORMAL HIGH (ref 5–40)

## 2019-04-16 ENCOUNTER — Other Ambulatory Visit: Payer: Self-pay | Admitting: Family Medicine

## 2019-04-16 DIAGNOSIS — Z794 Long term (current) use of insulin: Secondary | ICD-10-CM

## 2019-04-16 DIAGNOSIS — R7989 Other specified abnormal findings of blood chemistry: Secondary | ICD-10-CM

## 2019-04-16 DIAGNOSIS — E1165 Type 2 diabetes mellitus with hyperglycemia: Secondary | ICD-10-CM

## 2019-04-16 NOTE — Progress Notes (Signed)
Patient ID: Emily Farmer, female   DOB: 1956-04-13, 63 y.o.   MRN: [id number]   Recent lab work with glucose of 307 and increase in creatinine at 1.27 as well as elevated BUN of 33.  Patient will be contacted to make sure that she is restarted use of her insulin, remain hydrated with water and also will be asked to return for BMP in 1 to 2 weeks in follow-up of recent abnormal lab values.

## 2019-04-18 ENCOUNTER — Other Ambulatory Visit: Payer: Self-pay | Admitting: Family Medicine

## 2019-04-18 DIAGNOSIS — I2510 Atherosclerotic heart disease of native coronary artery without angina pectoris: Secondary | ICD-10-CM

## 2019-04-18 DIAGNOSIS — E781 Pure hyperglyceridemia: Secondary | ICD-10-CM

## 2019-04-18 MED ORDER — ATORVASTATIN CALCIUM 40 MG PO TABS: 40.0000 mg | ORAL_TABLET | Freq: Every day | ORAL | 3 refills | Status: DC

## 2019-04-18 MED FILL — ?ATORVASTATIN 40MG TABLET: 40 | 30 days supply | Qty: 30 | Fill #0

## 2019-04-18 NOTE — Progress Notes (Signed)
Patient ID: Emily Farmer, female   DOB: [birth year], 63 y.o.   MRN: [id number]   Recent lipid panel abnormal with elevated TG and LDL. Patient reports that she is out of atorvastatin and new RX will be sent into patient's pharmacy.

## 2019-04-29 ENCOUNTER — Other Ambulatory Visit: Payer: Self-pay

## 2019-04-29 ENCOUNTER — Ambulatory Visit: Payer: Self-pay | Attending: Family Medicine

## 2019-04-29 DIAGNOSIS — R7989 Other specified abnormal findings of blood chemistry: Secondary | ICD-10-CM

## 2019-04-29 DIAGNOSIS — Z794 Long term (current) use of insulin: Secondary | ICD-10-CM

## 2019-04-29 DIAGNOSIS — E1165 Type 2 diabetes mellitus with hyperglycemia: Secondary | ICD-10-CM

## 2019-04-30 LAB — BASIC METABOLIC PANEL WITH GFR
BUN/Creatinine Ratio: 26 (ref 12–28)
BUN: 27 mg/dL (ref 8–27)
CO2: 21 mmol/L (ref 20–29)
Calcium: 9.2 mg/dL (ref 8.7–10.3)
Chloride: 101 mmol/L (ref 96–106)
Creatinine, Ser: 1.03 mg/dL — ABNORMAL HIGH (ref 0.57–1.00)
GFR calc Af Amer: 67 mL/min/1.73
GFR calc non Af Amer: 58 mL/min/1.73 — ABNORMAL LOW
Glucose: 230 mg/dL — ABNORMAL HIGH (ref 65–99)
Potassium: 4.5 mmol/L (ref 3.5–5.2)
Sodium: 138 mmol/L (ref 134–144)

## 2019-05-06 ENCOUNTER — Telehealth (HOSPITAL_COMMUNITY): Payer: Self-pay

## 2019-05-06 ENCOUNTER — Telehealth: Payer: Self-pay | Admitting: Family Medicine

## 2019-05-06 NOTE — Telephone Encounter (Signed)
Patient returned call regarding her lab results. Please f/u

## 2019-05-06 NOTE — Telephone Encounter (Signed)
Telephoned patient at home number. Left message for patient to call and schedule with [facility]. °

## 2019-05-16 MED FILL — ?ATORVASTATIN 40MG TABLET: 40 | 30 days supply | Qty: 30 | Fill #1

## 2019-05-16 MED FILL — ?BASAGLAR 100 UNITS/ML KWPE: 100 | 30 days supply | Qty: 9 | Fill #1

## 2019-06-08 MED FILL — KETOROLAC 0.4% OPHTH SOLN: 0.4 | 18 days supply | Qty: 5 | Fill #0

## 2019-06-08 MED FILL — OFLOXACIN 0.3% EYE DROPS: 0.3 | 18 days supply | Qty: 5 | Fill #0

## 2019-06-08 MED FILL — PREDNISOLONE AC 1% EYE DROP: 1 | 37 days supply | Qty: 10 | Fill #0

## 2019-06-23 IMAGING — CT CT HEAD W/O CM
4 series · 16 of 47 positions shown, 18 images · non-contrast
Comparison: 06/08/2018.

CLINICAL DATA: Syncope

EXAM:
CT HEAD WITHOUT CONTRAST
TECHNIQUE: Contiguous axial images were obtained from the base of the skull
through the vertex without intravenous contrast.

[Series 3: head without · axial · non-contrast · 0.41mm/px · z∈[+1397,+1517]mm · 7 of 33 slices shown, 9 images]
[im 5/33  brain]
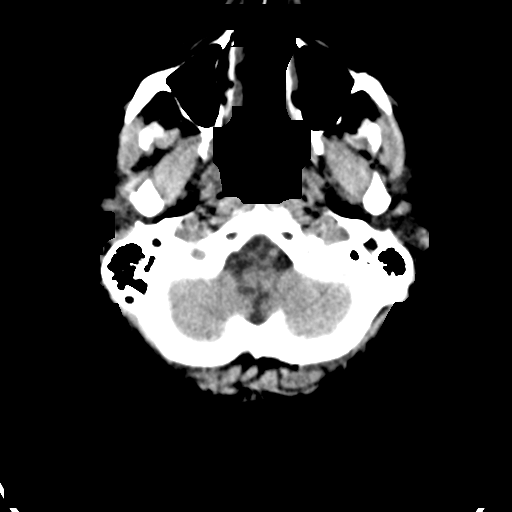
[im 5/33  bone]
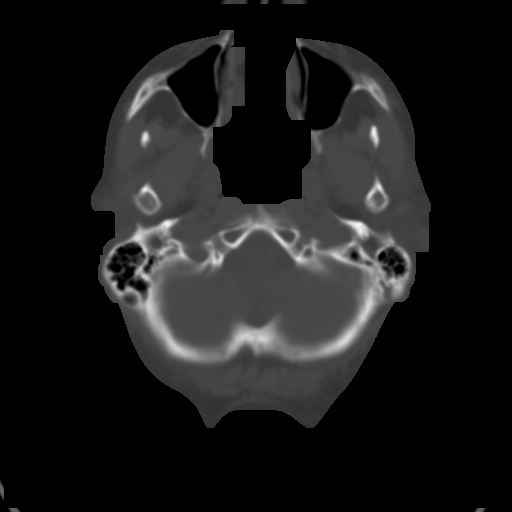
[im 9/33  brain]
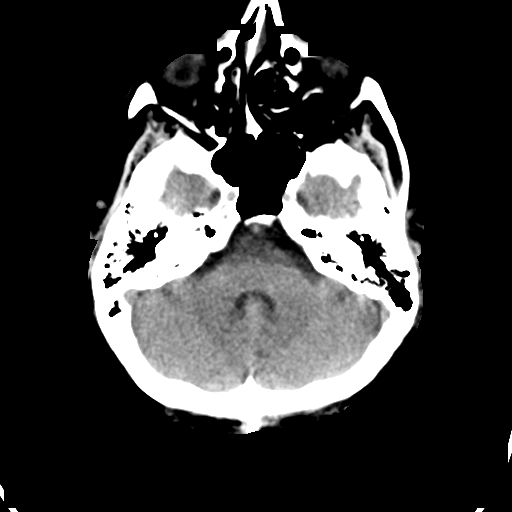
[im 13/33  brain]
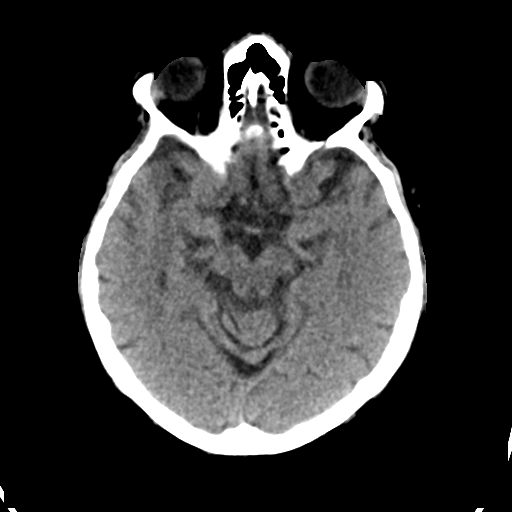
[im 17/33  brain]
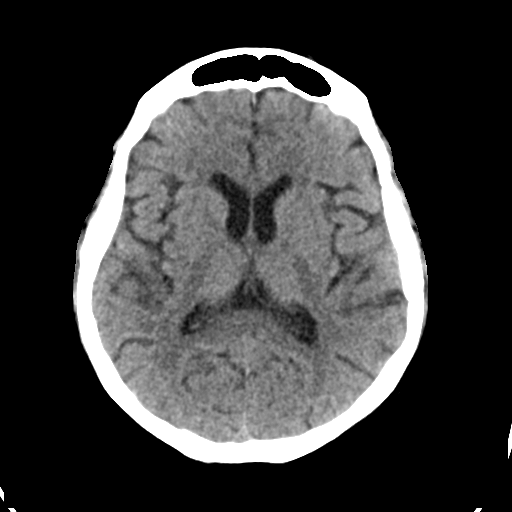
[im 21/33  brain]
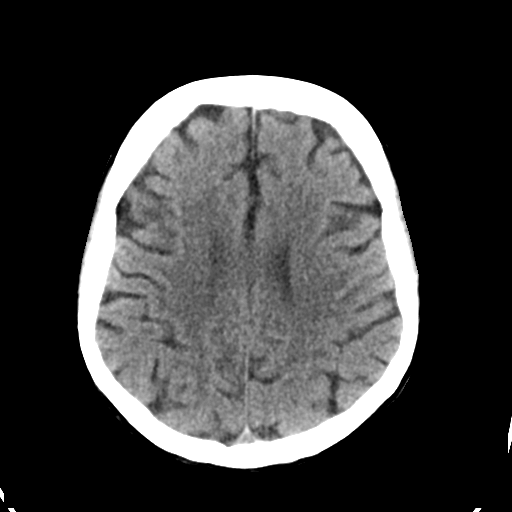
[im 21/33  bone]
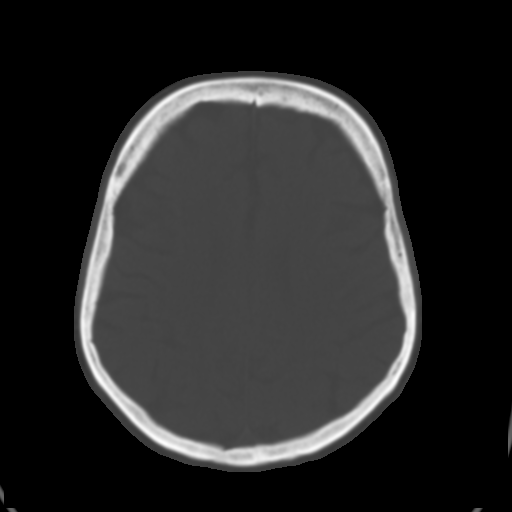
[im 25/33  brain]
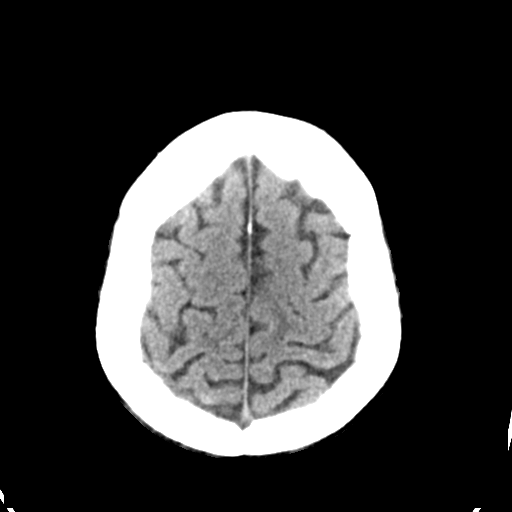
[im 29/33  brain]
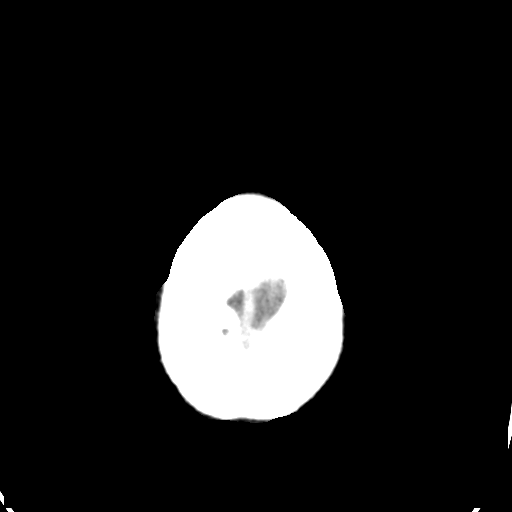

[Series 4: head bone · axial · 0.41mm/px · z∈[+1393,+1425]mm · 3 of 81 slices shown]
[im 9/81  bone]
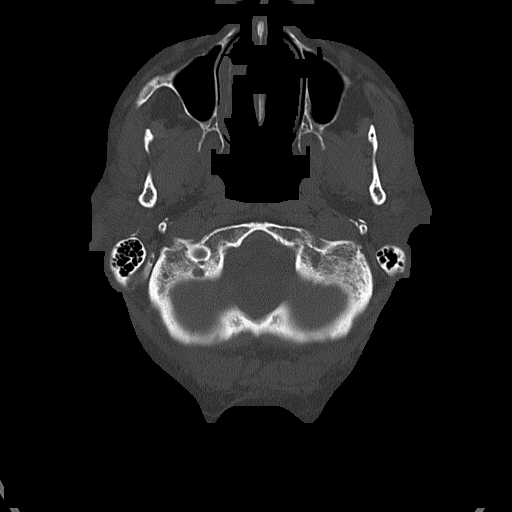
[im 17/81  bone]
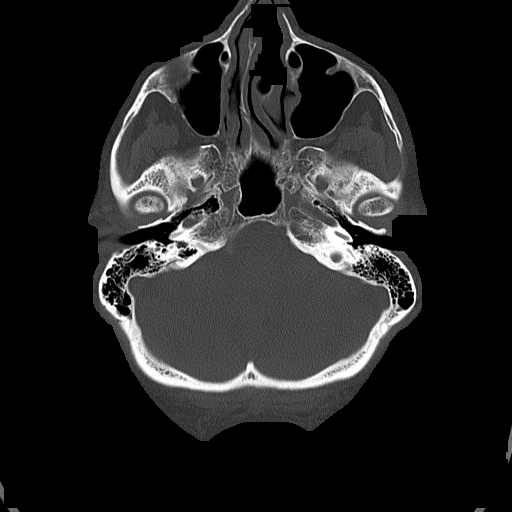
[im 25/81  bone]
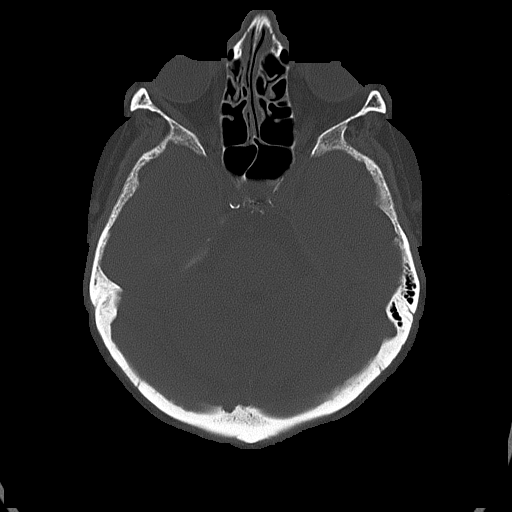

[Series 5: head without cor · coronal · non-contrast · 0.32mm/px · 3 of 67 slices shown]
[im 23/67  brain]
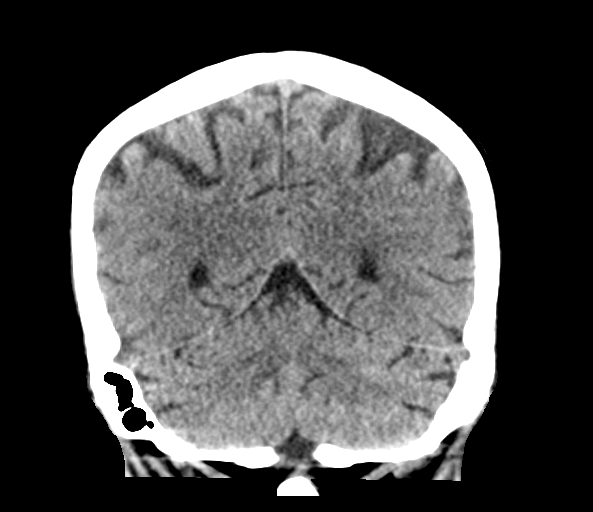
[im 30/67  brain]
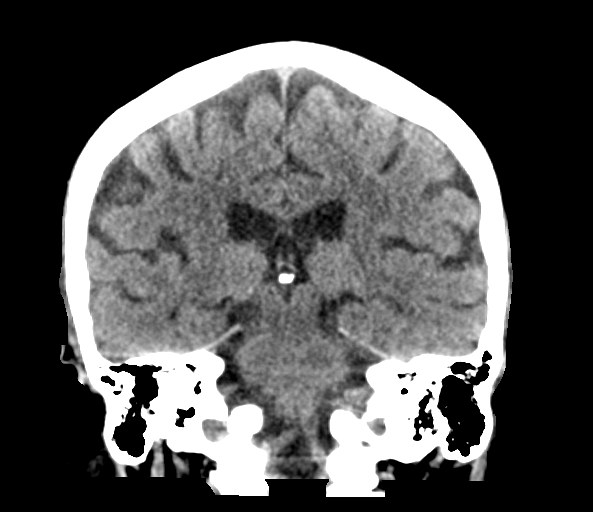
[im 37/67  brain]
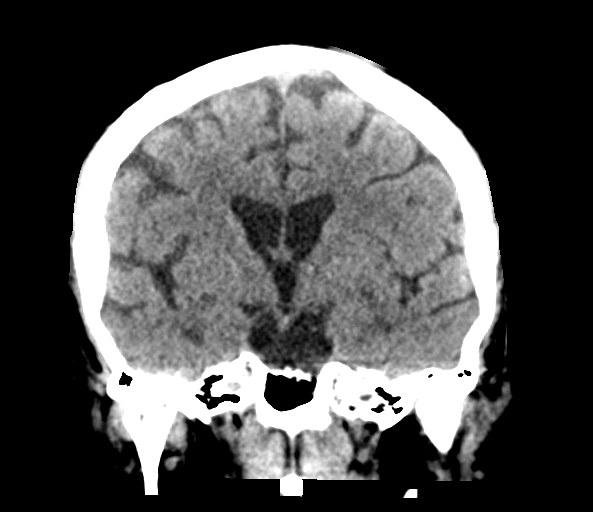

[Series 6: head without sag · sagittal · non-contrast · 0.32mm/px · 3 of 58 slices shown]
[im 20/58  brain]
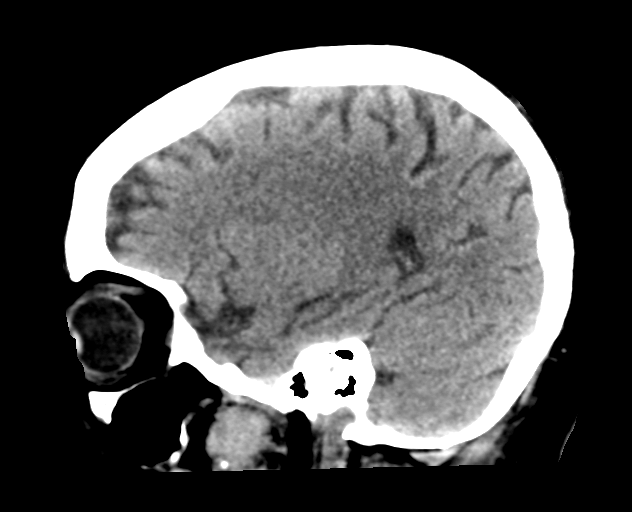
[im 29/58  brain]
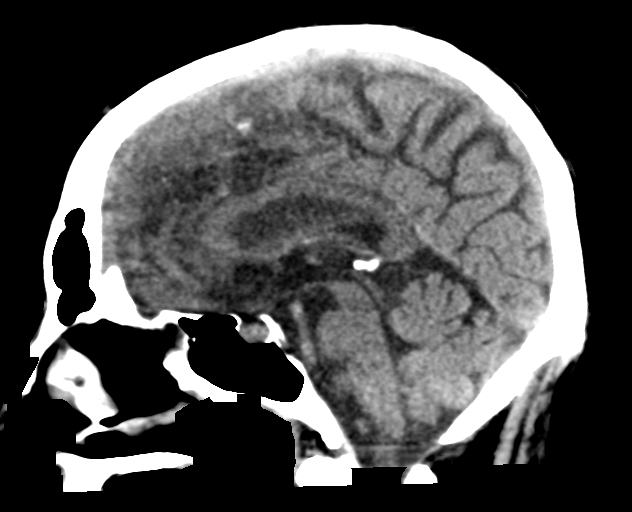
[im 39/58  brain]
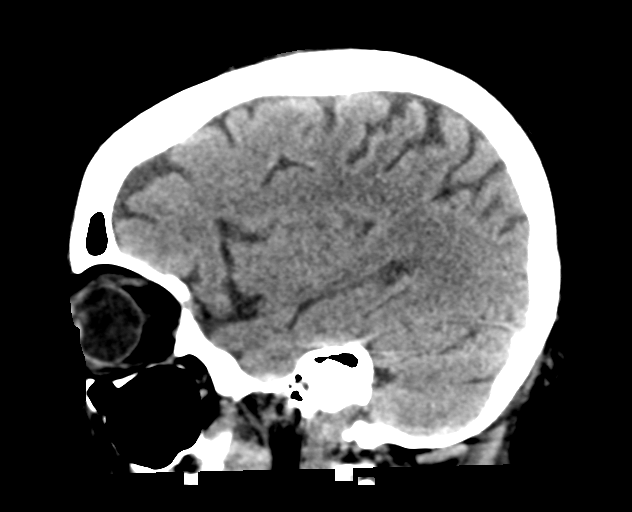

[16 of 47 positions shown; findings below may reference images not displayed]

FINDINGS: Brain: There is no evidence for acute hemorrhage, hydrocephalus,
mass lesion, or abnormal extra-axial fluid collection. No definite
CT evidence for acute infarction.

Vascular: No hyperdense vessel or unexpected calcification.

Skull: No evidence for fracture. No worrisome lytic or sclerotic
lesion.

Sinuses/Orbits: The visualized paranasal sinuses and mastoid air
cells are clear. Visualized portions of the globes and intraorbital
fat are unremarkable.

Other: None.
IMPRESSION: Stable.  No acute intracranial abnormality.

## 2019-07-02 ENCOUNTER — Other Ambulatory Visit: Payer: Self-pay | Admitting: Family Medicine

## 2019-07-02 DIAGNOSIS — Z794 Long term (current) use of insulin: Secondary | ICD-10-CM

## 2019-07-02 DIAGNOSIS — E1165 Type 2 diabetes mellitus with hyperglycemia: Secondary | ICD-10-CM

## 2019-07-02 NOTE — Progress Notes (Signed)
Patient ID: Emily Farmer, female   DOB: 1955/10/06, 64 y.o.   MRN: [id number]   Patient with continued uncontrolled diabetes with most recent hemoglobin A1c of 13.9 on 04/13/2019.  Will place referral to endocrinology for ongoing management of patient's diabetes.

## 2019-07-06 MED FILL — ?ATORVASTATIN 40MG TABL: 40 | 30 days supply | Qty: 30 | Fill #2

## 2019-07-06 MED FILL — ?BASAGLAR 100 UNITS/ML KWPE: 100 | 30 days supply | Qty: 9 | Fill #2

## 2019-08-09 ENCOUNTER — Other Ambulatory Visit: Payer: Self-pay | Admitting: Critical Care Medicine

## 2019-08-09 DIAGNOSIS — E1169 Type 2 diabetes mellitus with other specified complication: Secondary | ICD-10-CM

## 2019-08-09 MED FILL — ?BASAGLAR 100 UNITS/ML KWPE: 100 | 30 days supply | Qty: 9 | Fill #3

## 2019-08-09 MED FILL — ?ATORVASTATIN 40MG TABLET: 40 | 30 days supply | Qty: 30 | Fill #3

## 2019-08-09 MED FILL — FERROUS SULFATE 325 MG TAB: 325 (65 FE) | 30 days supply | Qty: 60 | Fill #1

## 2019-08-11 ENCOUNTER — Other Ambulatory Visit: Payer: Self-pay

## 2019-08-11 ENCOUNTER — Ambulatory Visit: Payer: Self-pay | Attending: Family Medicine | Admitting: Physician Assistant

## 2019-08-11 DIAGNOSIS — E1165 Type 2 diabetes mellitus with hyperglycemia: Secondary | ICD-10-CM

## 2019-08-11 DIAGNOSIS — Z794 Long term (current) use of insulin: Secondary | ICD-10-CM

## 2019-08-11 DIAGNOSIS — M62838 Other muscle spasm: Secondary | ICD-10-CM

## 2019-08-11 MED ORDER — METHOCARBAMOL 500 MG PO TABS: 500.0000 mg | ORAL_TABLET | Freq: Four times a day (QID) | ORAL | 0 refills | Status: DC | PRN

## 2019-08-11 MED ORDER — LANTUS SOLOSTAR 100 UNIT/ML ~~LOC~~ SOPN: 33.0000 [IU] | PEN_INJECTOR | Freq: Every day | SUBCUTANEOUS | 4 refills | Status: DC

## 2019-08-11 MED FILL — METHOCARBAMOL 500 MG TABS: 500 | 22 days supply | Qty: 90 | Fill #0

## 2019-08-11 NOTE — Progress Notes (Signed)
DOB and Name Verified  C/o BL muscle and joint leg  pain 2wks Pain 9/10  Little relief w acetaminophen 1000 mg   GBS 300 fasting

## 2019-08-11 NOTE — Progress Notes (Signed)
Virtual Visit via Telephone Note  I connected with Emily Farmer on 08/11/19 at  9:30 AM EST by telephone and verified that I am speaking with the correct person using two identifiers.   I discussed the limitations, risks, security and privacy concerns of performing an evaluation and management service by telephone and the availability of in person appointments. I also discussed with the patient that there may be a patient responsible charge related to this service. The patient expressed understanding and agreed to proceed.  PATIENT visit by telephone virtually in the context of Covid-19 pandemic. Patient location:  home My Location:  [facility] office Persons on the call: me and the patient   History of Present Illness:  Having muscle cramping and spasms in her thighs.  This has been going on for a few weeks.  Last A1C=13.9.  NKI.  She has been more active lately and feels like this is why her legs are sore.  Says she is compliant with meds, however, her blood sugar was >300 this morning and 200 yesterday.  No swelling, no redness, no SOB.  Spasms occur sporadically and are in various locations in her thighs-front and back.      Observations/Objective:  NAD.  A&Ox3   Assessment and Plan: 1. Type 2 diabetes mellitus with hyperglycemia, with long-term current use of insulin (HCC) Uncontrolled.  Continue metformin and increase lantus dose.  drink 80-100 ounces water daily. -CMP and A1C ordered - Insulin Glargine (LANTUS SOLOSTAR) 100 UNIT/ML Solostar Pen; Inject 33 Units into the skin at bedtime.  Dispense: 45 mL; Refill: 4 Diabetic diet discussed at length.  Concern for electrolyte imbalance due to poor diet, questionable compliance with meds.  Reiterated the importance of all this.    2. Muscle spasm -methocarbamol 500mg  qid prn - Comprehensive metabolic panel; Future    Follow Up Instructions: See and labs in 3 weeks   I discussed the assessment and treatment plan with the patient.  The patient was provided an opportunity to ask questions and all were answered. The patient agreed with the plan and demonstrated an understanding of the instructions.   The patient was advised to call back or seek an in-person evaluation if the symptoms worsen or if the condition fails to improve as anticipated.  I provided 11 minutes of non-face-to-face time during this encounter.   Franky Macho, PA-C  Patient ID: Emily Farmer, female   DOB: 02/22/1956, 64 y.o.   MRN: [id number]

## 2019-08-15 ENCOUNTER — Ambulatory Visit: Payer: Medicaid Other | Admitting: Family Medicine

## 2019-08-26 ENCOUNTER — Ambulatory Visit: Payer: Medicaid Other | Admitting: Family Medicine

## 2019-09-09 ENCOUNTER — Ambulatory Visit: Payer: Medicaid Other | Admitting: Internal Medicine

## 2019-09-19 MED FILL — !BASAGLAR 100 UNIT/ML KWIK: 100 | 30 days supply | Qty: 9 | Fill #4

## 2019-09-19 MED FILL — ?ATORVASTATIN 40MG TABLET: 40 | 30 days supply | Qty: 30 | Fill #4

## 2019-09-21 ENCOUNTER — Other Ambulatory Visit: Payer: Self-pay

## 2019-09-22 ENCOUNTER — Ambulatory Visit: Payer: Medicaid Other | Admitting: Family Medicine

## 2019-09-23 ENCOUNTER — Other Ambulatory Visit: Payer: Self-pay

## 2019-09-23 ENCOUNTER — Ambulatory Visit (INDEPENDENT_AMBULATORY_CARE_PROVIDER_SITE_OTHER): Payer: Medicaid Other | Admitting: Internal Medicine

## 2019-09-23 ENCOUNTER — Encounter: Payer: Self-pay | Admitting: Internal Medicine

## 2019-09-23 VITALS — BP 132/88 | HR 84 | Temp 98.2°F | Ht 63.0 in | Wt 171.0 lb

## 2019-09-23 DIAGNOSIS — E1165 Type 2 diabetes mellitus with hyperglycemia: Secondary | ICD-10-CM

## 2019-09-23 DIAGNOSIS — E1142 Type 2 diabetes mellitus with diabetic polyneuropathy: Secondary | ICD-10-CM

## 2019-09-23 DIAGNOSIS — Z794 Long term (current) use of insulin: Secondary | ICD-10-CM | POA: Insufficient documentation

## 2019-09-23 DIAGNOSIS — E785 Hyperlipidemia, unspecified: Secondary | ICD-10-CM

## 2019-09-23 LAB — BASIC METABOLIC PANEL
BUN: 34 mg/dL — ABNORMAL HIGH (ref 6–23)
CO2: 25 mEq/L (ref 19–32)
Calcium: 9.3 mg/dL (ref 8.4–10.5)
Chloride: 93 mEq/L — ABNORMAL LOW (ref 96–112)
Creatinine, Ser: 1.2 mg/dL (ref 0.40–1.20)
GFR: 45.45 mL/min — ABNORMAL LOW (ref >60.00)
Glucose, Bld: 374 mg/dL — ABNORMAL HIGH (ref 70–99)
Potassium: 4.2 mEq/L (ref 3.5–5.1)
Sodium: 130 mEq/L — ABNORMAL LOW (ref 135–145)

## 2019-09-23 LAB — LDL CHOLESTEROL, DIRECT: Direct LDL: 96 mg/dL

## 2019-09-23 LAB — POCT GLYCOSYLATED HEMOGLOBIN (HGB A1C): Hemoglobin A1C: 12.4 % — AB (ref 4.0–5.6)

## 2019-09-23 LAB — MICROALBUMIN / CREATININE URINE RATIO
Creatinine,U: 112.8 mg/dL
Microalb Creat Ratio: 27.9 mg/g (ref 0.0–30.0)
Microalb, Ur: 31.4 mg/dL — ABNORMAL HIGH (ref 0.0–1.9)

## 2019-09-23 LAB — LIPID PANEL
Cholesterol: 227 mg/dL — ABNORMAL HIGH (ref 0–200)
HDL: 43.3 mg/dL (ref >39.00)
Total CHOL/HDL Ratio: 5
Triglycerides: 446 mg/dL — ABNORMAL HIGH (ref 0.0–149.0)

## 2019-09-23 LAB — GLUCOSE, POCT (MANUAL RESULT ENTRY): POC Glucose: 383 mg/dl — AB (ref 70–99)

## 2019-09-23 MED ORDER — INSULIN LISPRO (1 UNIT DIAL) 100 UNIT/ML (KWIKPEN): 6.0000 [IU] | PEN_INJECTOR | Freq: Three times a day (TID) | SUBCUTANEOUS | 11 refills | Status: DC

## 2019-09-23 MED ORDER — LANTUS SOLOSTAR 100 UNIT/ML ~~LOC~~ SOPN: 20.0000 [IU] | PEN_INJECTOR | Freq: Every day | SUBCUTANEOUS | 6 refills | Status: DC

## 2019-09-23 MED ORDER — NOVOLOG FLEXPEN 100 UNIT/ML ~~LOC~~ SOPN: 6.0000 [IU] | PEN_INJECTOR | Freq: Three times a day (TID) | SUBCUTANEOUS | 11 refills | Status: DC

## 2019-09-23 MED FILL — ?HUMALOG 100 UNITS/ML KWIKP: 100 | 33 days supply | Qty: 6 | Fill #0

## 2019-09-23 NOTE — Progress Notes (Signed)
Name: Emily Farmer  MRN/ DOB: [id number], 22-Jan-1956   Age/ Sex: 64 y.o., female    PCP: Antony Blackbird, MD   Reason for Endocrinology Evaluation: Type 2 Diabetes Mellitus     Date of Initial Endocrinology Visit: 09/26/2019     PATIENT IDENTIFIER: Emily Farmer is a 64 y.o. female with a past medical history of DM, HTN, A.Fib  and dyslipidemia. The patient presented for initial endocrinology clinic visit on 09/26/2019 for consultative assistance with her diabetes management.    HPI: [name] was    Diagnosed with DM in 2013 Prior Medications tried/Intolerance: Just  Currently checking blood sugars 3 x / day,  before breakfast, lunch and bedtime   Hypoglycemia episodes : No          Hemoglobin A1c has ranged from 9.5% in 2020, peaking at 12.2% in 2019. Patient required assistance for hypoglycemia: no Patient has required hospitalization within the last 1 year from hyper or hypoglycemia: no  In terms of diet, the patient eats 3 meals a day, drinks sugar-sweetened beverages.   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 Arthritis   . Atrial fibrillation ([place])    a. 09/2016 in setting of pancreatitis;  b. 09/2016 Echo: EF 65-60%, no rwma, Gr2 DD, mild MR, triv TR, PASP 52mHg;  CHA2DS2VASc = 4-->Eliquis 572mBID. c. Recurred 06/2018 in setting of GIB.  . Mar[place] Kitchenhronic diastolic CHF (congestive heart  failure) ([facility]/15/2019  . Diabetes mellitus   . GI bleeding 06/2018  . Gout   . Hyperlipidemia   . Hypertension   . Hypertriglyceridemia   . Pancreatitis    a. 09/2016 - Triglycerides 1,392 on admission.   Past Surgical History:  Past Surgical History:  Procedure Laterality Date  . BIOPSY  06/20/2018   Procedure: BIOPSY;  Surgeon: MaClarene EssexMD;  [facility];  Service: Endoscopy;;  . CHOLECYSTECTOMY N/A 01/04/2014   Procedure: LAPAROSCOPIC CHOLECYSTECTOMY WITH INTRAOPERATIVE CHOLANGIOGRAM;  Surgeon: ArRalene OkMD;  [facility] Service: General;  Laterality: N/A;  . COLONOSCOPY WITH PROPOFOL N/A 06/21/2018   Procedure: COLONOSCOPY WITH PROPOFOL;  Surgeon: BuRonald LoboMD;  [facility] Service: Endoscopy;  Laterality: N/A;  . ESOPHAGOGASTRODUODENOSCOPY (EGD) WITH PROPOFOL N/A 06/20/2018   Procedure: ESOPHAGOGASTRODUODENOSCOPY (EGD) WITH PROPOFOL;  Surgeon: MaClarene EssexMD;  [facility] Service: Endoscopy;  Laterality: N/A;  . LUNG SURGERY        Social History:  reports that she has never smoked. She has never used smokeless tobacco. She reports that she does not drink alcohol or use drugs. Family History:  Family History  Problem Relation Age of Onset  . Heart attack Mother   . Heart attack Father   . Diabetes Sister   . High blood pressure Neg Hx   . High Cholesterol Neg  Hx      HOME MEDICATIONS: Allergies as of 09/23/2019   No Known Allergies     Medication List       Accurate as of September 23, 2019 11:59 PM. If you have any questions, ask your nurse or doctor.        STOP taking these medications   pantoprazole 40 MG tablet Commonly known as: PROTONIX Stopped by: Dorita Sciara, MD     TAKE these medications   acetaminophen 500 MG tablet Commonly known as: TYLENOL Take 1,000 mg by mouth every 6 (six) hours as needed for moderate pain.   acetaminophen 325 MG tablet Commonly known as: TYLENOL Take 2  tablets (650 mg total) by mouth every 6 (six) hours as needed for mild pain (or Fever >/= 101).   apixaban 5 MG Tabs tablet Commonly known as: Eliquis Take 1 tablet (5 mg total) by mouth 2 (two) times daily.   atorvastatin 40 MG tablet Commonly known as: LIPITOR Take 1 tablet (40 mg total) by mouth daily.   CENTRUM SILVER PO Take 1 tablet by mouth daily.   cetirizine 10 MG tablet Commonly known as: ZYRTEC Take 1 tablet (10 mg total) by mouth daily.   dicyclomine 20 MG tablet Commonly known as: BENTYL Take 1 tablet (20 mg total) by mouth 2 (two) times daily as needed for spasms.   ferrous sulfate 325 (65 FE) MG tablet Take 1 tablet (325 mg total) by mouth 2 (two) times daily with a meal.   glucose blood test strip Commonly known as: True Metrix Blood Glucose Test Use as instructed to check blood sugars 3 times per day   insulin lispro 100 UNIT/ML KwikPen Commonly known as: HumaLOG KwikPen Inject 0.06 mLs (6 Units total) into the skin 3 (three) times daily. Started by: Dorita Sciara, MD   Insulin Pen Needle 32G X 4 MM Misc Commonly known as: TRUEplus Pen Needles Use with insulin pen to inject Lantus daily.   Insulin Syringe-Needle U-100 31G X 5/16" 0.3 ML Misc Commonly known as: TRUEplus Insulin Syringe Use to inject insulin daily.   Lantus SoloStar 100 UNIT/ML Solostar Pen Generic drug: insulin glargine Inject 20 Units into the skin at bedtime. What changed: how much to take Changed by: Dorita Sciara, MD   metFORMIN 500 MG tablet Commonly known as: GLUCOPHAGE Take 2 tablets (1,000 mg total) by mouth 2 (two) times daily with a meal.   methocarbamol 500 MG tablet Commonly known as: Robaxin Take 1 tablet (500 mg total) by mouth every 6 (six) hours as needed for muscle spasms.   mupirocin ointment 2 % Commonly known as: BACTROBAN Place 1 application into the nose 2 (two) times daily.   True Metrix Meter w/Device Kit Use to monitor blood sugar as  directed   TRUEplus Lancets 28G Misc Use to check blood sugar 3 times per day        ALLERGIES: No Known Allergies   REVIEW OF SYSTEMS: A comprehensive ROS was conducted with the patient and is negative except as per HPI and below:  Review of Systems  Gastrointestinal: Positive for diarrhea. Negative for nausea.       Intermittent       OBJECTIVE:   VITAL SIGNS: BP 132/88 (BP Location: Left Arm, Patient Position: Sitting, Cuff Size: Large)   Pulse 84   Temp 98.2 F (36.8 C)   Ht '5\' 3"'  (1.6 m)   Wt 171 lb (77.6 kg)   SpO2 98%  BMI 30.29 kg/m    PHYSICAL EXAM:  General: Pt appears well and is in NAD  HEENT:  Eyes: External eye exam normal without stare, lid lag or exophthalmos.  EOM intact.   Neck: General: Supple without adenopathy or carotid bruits. Thyroid: Thyroid size normal.  No goiter or nodules appreciated. No thyroid bruit.  Lungs: Clear with good BS bilat with no rales, rhonchi, or wheezes  Heart: RRR with normal S1 and S2 and no gallops; no murmurs; no rub  Abdomen: Normoactive bowel sounds, soft, nontender, without masses or organomegaly palpable  Extremities:  Lower extremities - No pretibial edema.   Neuro: MS is good with appropriate affect,pt is alert    DM foot exam: 09/23/2019  The skin of the feet is intact without sores or ulcerations, but has thickened and discolored toe nails The pedal pulses are 2+ on right and 2+ on left. The sensation is decreased to a screening 5.07, 10 gram monofilament bilaterally   DATA REVIEWED:  Lab Results  Component Value Date   HGBA1C 12.4 (A) 09/23/2019   HGBA1C 13.9 (A) 04/13/2019   HGBA1C 9.5 (H) 07/19/2018   Lab Results  Component Value Date   MICROALBUR 31.4 (H) 09/23/2019   LDLCALC 150 (H) 04/13/2019   CREATININE 1.20 09/23/2019   Lab Results  Component Value Date   MICRALBCREAT 27.9 09/23/2019    Lab Results  Component Value Date   CHOL 227 (H) 09/23/2019   HDL 43.30 09/23/2019    LDLCALC 150 (H) 04/13/2019   LDLDIRECT 96.0 09/23/2019   TRIG (H) 09/23/2019    446.0 Triglyceride is over 400; calculations on Lipids are invalid.   CHOLHDL 5 09/23/2019        ASSESSMENT / PLAN / RECOMMENDATIONS:   1) Type 2 Diabetes Mellitus, Poorly controlled, With neuropathic complications - Most recent A1c of 12.4 %. Goal A1c < 7.0 %.     - I have discussed with the patient the pathophysiology of diabetes. We went over the natural progression of the disease. We talked about both insulin resistance and insulin deficiency. We stressed the importance of lifestyle changes including diet and exercise. I explained the complications associated with diabetes including retinopathy, nephropathy, neuropathy as well as increased risk of cardiovascular disease. We went over the benefit seen with glycemic control.   - I explained to the patient that diabetic patients are at higher than normal risk for amputations. The patient was informed that diabetes is the number one cause of non-traumatic amputations in [place]. The patient was advised to look and examine their feet . -During her foot exam today she was noted to have multiple Band-Aids over the tips of her toes, patient stated she does that so that her toenails do not grab onto her socks, I have advised her to contact her PCP for a referral to a podiatrist. -I also question her compliance with her medications, there was some back-and-forth about how much or how many tablets of metformin she is taking. -We discussed the importance of compliance with medication intake, she is only able to tolerate half a tablet of metformin at a time up to 4 times daily, due to vomiting. -We are going to add short acting insulin to cover her meals and reduce her Lantus dose as below -I have encouraged her to bring her meter on the next visit  MEDICATIONS:  Decrease Lantus to 20 units daily  Start NovoLog 6 units 3 times daily before every meal  Continue  metformin 500  mg, half a tablet 4 times daily-as she has been doing  EDUCATION / INSTRUCTIONS:  BG monitoring instructions: Patient is instructed to check her blood sugars 3 times a day, before meals.  Call [facility] if: BG persistently < 70 or > 300. . I reviewed the Rule of 15 for the treatment of hypoglycemia in detail with the patient. Literature supplied.   2) Diabetic complications:   Eye: Does not have known diabetic retinopathy.  Pending cataract surgery  Neuro/ Feet: Does have known diabetic peripheral neuropathy.  Renal: Patient does have known baseline CKD. She is not on an ACEI/ARB at present.Up to date on urine albumin/creatinine ratio   3) Lipids: Patient is is on atorvastatin 40 mg daily, repeat lipid panel shows LDL at 96 mg/dL and elevated Tg ( but this is improving ) . I question compliance, will emphasize dietary changes, and continue to monitor prior to making any changes.    Follow-up in 3 months     Signed electronically by: Mack Guise, MD  [facility]  [address] [place], [place] [address] Phone: [phone] FAX: [phone]   CC: Antony Blackbird, MD [place] [address] Phone: [phone]  Fax: [phone]    Return to Endocrinology clinic as below: Future Appointments  Date Time Provider [place]  09/27/2019  2:50 PM Ladell Pier, MD [facility] None  12/23/2019  9:30 AM Courtnie Brenes, Melanie Crazier, MD [facility] None

## 2019-09-23 NOTE — Patient Instructions (Signed)
-   Continue Metformin 500 mg , HALF a tablet four times daily  - Decrease Lantus to 20 units daily  - Start Novolog 6 units with EACH meal   - Check sugar before each meal and bring meter on next visit     HOW TO TREAT LOW BLOOD SUGARS (Blood sugar LESS THAN 70 MG/DL)  Please follow the RULE OF 15 for the treatment of hypoglycemia treatment (when your (blood sugars are less than 70 mg/dL)    STEP 1: Take 15 grams of carbohydrates when your blood sugar is low, which includes:   3-4 GLUCOSE TABS  OR  3-4 OZ OF JUICE OR REGULAR SODA OR  ONE TUBE OF GLUCOSE GEL     STEP 2: RECHECK blood sugar in 15 MINUTES STEP 3: If your blood sugar is still low at the 15 minute recheck --> then, go back to STEP 1 and treat AGAIN with another 15 grams of carbohydrates.

## 2019-09-26 ENCOUNTER — Other Ambulatory Visit: Payer: Self-pay | Admitting: Family Medicine

## 2019-09-26 ENCOUNTER — Encounter: Payer: Self-pay | Admitting: Internal Medicine

## 2019-09-26 DIAGNOSIS — Z794 Long term (current) use of insulin: Secondary | ICD-10-CM

## 2019-09-26 DIAGNOSIS — E1165 Type 2 diabetes mellitus with hyperglycemia: Secondary | ICD-10-CM

## 2019-09-26 DIAGNOSIS — E1142 Type 2 diabetes mellitus with diabetic polyneuropathy: Secondary | ICD-10-CM

## 2019-09-26 NOTE — Progress Notes (Signed)
Please let patient know that a referral has been sent for her to follow-up with podiatry for diabetic foot care.  Please have her contact this office if she is not contacted by podiatry regarding an appointment within the next 2 weeks

## 2019-09-26 NOTE — Progress Notes (Signed)
Patient ID: Emily Farmer, female   DOB: 12/05/1955, 64 y.o.   MRN: [id number]  Review of patient's recent endocrinology note, podiatry referral suggested due to patient with thickened, overgrown toenails

## 2019-09-27 ENCOUNTER — Other Ambulatory Visit: Payer: Self-pay

## 2019-09-27 ENCOUNTER — Other Ambulatory Visit: Payer: Self-pay | Admitting: Internal Medicine

## 2019-09-27 ENCOUNTER — Ambulatory Visit: Payer: Self-pay | Attending: Internal Medicine | Admitting: Internal Medicine

## 2019-09-27 ENCOUNTER — Ambulatory Visit: Payer: Medicaid Other | Admitting: Internal Medicine

## 2019-09-27 DIAGNOSIS — E1169 Type 2 diabetes mellitus with other specified complication: Secondary | ICD-10-CM

## 2019-09-27 DIAGNOSIS — I10 Essential (primary) hypertension: Secondary | ICD-10-CM

## 2019-09-27 DIAGNOSIS — I110 Hypertensive heart disease with heart failure: Secondary | ICD-10-CM

## 2019-09-27 DIAGNOSIS — E785 Hyperlipidemia, unspecified: Secondary | ICD-10-CM

## 2019-09-27 DIAGNOSIS — E669 Obesity, unspecified: Secondary | ICD-10-CM

## 2019-09-27 DIAGNOSIS — I480 Paroxysmal atrial fibrillation: Secondary | ICD-10-CM

## 2019-09-27 DIAGNOSIS — E119 Type 2 diabetes mellitus without complications: Secondary | ICD-10-CM

## 2019-09-27 DIAGNOSIS — I5032 Chronic diastolic (congestive) heart failure: Secondary | ICD-10-CM

## 2019-09-27 MED ORDER — METFORMIN HCL 500 MG PO TABS: 250.0000 mg | ORAL_TABLET | Freq: Four times a day (QID) | ORAL | 2 refills | Status: DC

## 2019-09-27 MED ORDER — APIXABAN 5 MG PO TABS: 5.0000 mg | ORAL_TABLET | Freq: Two times a day (BID) | ORAL | 1 refills | Status: DC

## 2019-09-27 MED FILL — ELIQUIS 5 MG TABLET: 5 | 30 days supply | Qty: 60 | Fill #0

## 2019-09-27 MED FILL — ?METFORMIN HCL 500MG TABLET: 500 | 30 days supply | Qty: 60 | Fill #0

## 2019-09-27 NOTE — Progress Notes (Signed)
PT states her blood sugar was 338

## 2019-09-27 NOTE — Progress Notes (Signed)
Virtual Visit via Telephone Note Due to current restrictions/limitations of in-office visits due to the COVID-19 pandemic, this scheduled clinical appointment was converted to a telehealth visit  I connected with Emily Farmer on 09/27/19 at  2:50 PM EDT by telephone and verified that I am speaking with the correct person using two identifiers. I am in my office.  The patient is at home.  Only the patient and myself participated in this encounter.  I discussed the limitations, risks, security and privacy concerns of performing an evaluation and management service by telephone and the availability of in person appointments. I also discussed with the patient that there may be a patient responsible charge related to this service. The patient expressed understanding and agreed to proceed.   History of Present Illness: Patient with history of DM type II, A. 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  PCP is [name].   DM:  Needing RF on Metformin. Out x 2 wks Saw Dr. Kelton Pillar 4/9/22021.  Lantus dec to 20 units, started Novolog 6 units with meals. Told to take Metformin 500 mg 1/2 tab QID because she was not able to tolerate higher dose.  BS this a.m was 230 before BF.  Trying to cut back on sweets.  Changing from soda to [employer] Dr. Kelton Pillar noted she had bandaids on toes. Pt states she does not have any sores on feet.  Nails overgrown.  [name] has submitted referral for her to see Podiatrist  CHF/a.fib/HTN: Inquired whether patient is taking Eliquis.  She tells me that amiodarone and Eliquis were discontinued by Dr. Debara Pickett when she last saw him.  However on review of his note, I see that he did discontinue the amiodarone but told her to continue the Eliquis.  She states that if she is to continue which she will need refill. No CP, SOB, LE edema or palpitations Checks BP 3 x a wk. This a.m was 129/80  HL: not at goal on recent blood tests.  Triglycerides in the  400s which is improved. Report compliance with Lipitor.  She tells me she is taking 40 mg BID but med list says once a day.. Outpatient Encounter Medications as of 09/27/2019  Medication Sig  . acetaminophen (TYLENOL) 325 MG tablet Take 2 tablets (650 mg total) by mouth every 6 (six) hours as needed for mild pain (or Fever >/= 101).  Marland Kitchen acetaminophen (TYLENOL) 500 MG tablet Take 1,000 mg by mouth every 6 (six) hours as needed for moderate pain.  Marland Kitchen apixaban (ELIQUIS) 5 MG TABS tablet Take 1 tablet (5 mg total) by mouth 2 (two) times daily. (Patient not taking: Reported on 09/23/2019)  . atorvastatin (LIPITOR) 40 MG tablet Take 1 tablet (40 mg total) by mouth daily.  . Blood Glucose Monitoring Suppl (TRUE METRIX METER) w/Device KIT Use to monitor blood sugar as directed  . cetirizine (ZYRTEC) 10 MG tablet Take 1 tablet (10 mg total) by mouth daily. (Patient not taking: Reported on 08/11/2019)  . dicyclomine (BENTYL) 20 MG tablet Take 1 tablet (20 mg total) by mouth 2 (two) times daily as needed for spasms. (Patient not taking: Reported on 08/11/2019)  . ferrous sulfate 325 (65 FE) MG tablet Take 1 tablet (325 mg total) by mouth 2 (two) times daily with a meal.  . glucose blood (TRUE METRIX BLOOD GLUCOSE TEST) test strip Use as instructed to check blood sugars 3 times per day  . insulin glargine (LANTUS SOLOSTAR) 100 UNIT/ML  Solostar Pen Inject 20 Units into the skin at bedtime.  . insulin lispro (HUMALOG KWIKPEN) 100 UNIT/ML KwikPen Inject 0.06 mLs (6 Units total) into the skin 3 (three) times daily.  . Insulin Pen Needle (TRUEPLUS PEN NEEDLES) 32G X 4 MM MISC Use with insulin pen to inject Lantus daily.  . Insulin Syringe-Needle U-100 (TRUEPLUS INSULIN SYRINGE) 31G X 5/16" 0.3 ML MISC Use to inject insulin daily.  . metFORMIN (GLUCOPHAGE) 500 MG tablet Take 2 tablets (1,000 mg total) by mouth 2 (two) times daily with a meal.  . methocarbamol (ROBAXIN) 500 MG tablet Take 1 tablet (500 mg total) by mouth  every 6 (six) hours as needed for muscle spasms.  . Multiple Vitamins-Minerals (CENTRUM SILVER PO) Take 1 tablet by mouth daily.  . mupirocin ointment (BACTROBAN) 2 % Place 1 application into the nose 2 (two) times daily.  . TRUEPLUS LANCETS 28G MISC Use to check blood sugar 3 times per day   No facility-administered encounter medications on file as of 09/27/2019.    Observations/Objective: Results for orders placed or performed in visit on 09/23/19  Microalbumin / creatinine urine ratio  Result Value Ref Range   Microalb, Ur 31.4 (H) 0.0 - 1.9 mg/dL   Creatinine,U 112.8 mg/dL   Microalb Creat Ratio 27.9 0.0 - 30.0 mg/g  Lipid panel  Result Value Ref Range   Cholesterol 227 (H) 0 - 200 mg/dL   Triglycerides (H) 0.0 - 149.0 mg/dL    446.0 Triglyceride is over 400; calculations on Lipids are invalid.   HDL 43.30 >39.00 mg/dL   Total CHOL/HDL Ratio 5   Basic metabolic panel  Result Value Ref Range   Sodium 130 (L) 135 - 145 mEq/L   Potassium 4.2 3.5 - 5.1 mEq/L   Chloride 93 (L) 96 - 112 mEq/L   CO2 25 19 - 32 mEq/L   Glucose, Bld 374 (H) 70 - 99 mg/dL   BUN 34 (H) 6 - 23 mg/dL   Creatinine, Ser 1.20 0.40 - 1.20 mg/dL   GFR 45.45 (L) >60.00 mL/min   Calcium 9.3 8.4 - 10.5 mg/dL  LDL cholesterol, direct  Result Value Ref Range   Direct LDL 96.0 mg/dL  POCT HgB A1C  Result Value Ref Range   Hemoglobin A1C 12.4 (A) 4.0 - 5.6 %   HbA1c POC (<> result, manual entry)     HbA1c, POC (prediabetic range)     HbA1c, POC (controlled diabetic range)    POCT Glucose (CBG)  Result Value Ref Range   POC Glucose 383 (A) 70 - 99 mg/dl   Lab Results  Component Value Date   WBC 9.1 04/13/2019   HGB 11.6 04/13/2019   HCT 36.8 04/13/2019   MCV 89 04/13/2019   PLT 276 04/13/2019     Assessment and Plan: 1. Diabetes mellitus type 2 in obese [facility]) Not at goal.  Recently saw the endocrinologist.  She will continue Lantus, NovoLog and Metformin as prescribed.  Refill sent on Metformin.   Dietary counseling given. Good diabetic foot care discussed and encouraged.  Advised patient that she should be receiving a call for podiatry appointment. - metFORMIN (GLUCOPHAGE) 500 MG tablet; Take 0.5 tablets (250 mg total) by mouth 4 (four) times daily.  Dispense: 60 tablet; Refill: 2  2. Dyslipidemia Advised patient that the Lipitor should be 40 mg daily.  I suspect patient may not be as compliant as she states so I will not add Lopid at this time.  3. Paroxysmal A-fib (  [place]) Refill given on Eliquis. - apixaban (ELIQUIS) 5 MG TABS tablet; Take 1 tablet (5 mg total) by mouth 2 (two) times daily.  Dispense: 60 tablet; Refill: 1  4. Chronic diastolic CHF (congestive heart failure) (HCC) Stable based on symptoms.  Recommend low-salt diet.   Follow Up Instructions: 7 wks with [name]   I discussed the assessment and treatment plan with the patient. The patient was provided an opportunity to ask questions and all were answered. The patient agreed with the plan and demonstrated an understanding of the instructions.   The patient was advised to call back or seek an in-person evaluation if the symptoms worsen or if the condition fails to improve as anticipated.  I provided 13 minutes of non-face-to-face time during this encounter.   Karle Plumber, MD

## 2019-10-25 ENCOUNTER — Telehealth: Payer: Self-pay | Admitting: Nutrition

## 2019-10-25 NOTE — Telephone Encounter (Signed)
Message left on the machine that I am not able to see her on the 18th.  Other times that I can see her were given to her, and a number to call me to reschedule was also given to her

## 2019-10-26 NOTE — Telephone Encounter (Signed)
Appointment rescheduled.

## 2019-11-01 ENCOUNTER — Ambulatory Visit: Payer: Medicaid Other | Admitting: Nutrition

## 2019-11-07 ENCOUNTER — Encounter: Payer: Medicaid Other | Attending: Internal Medicine | Admitting: Nutrition

## 2019-11-07 ENCOUNTER — Other Ambulatory Visit: Payer: Self-pay

## 2019-11-07 DIAGNOSIS — E1169 Type 2 diabetes mellitus with other specified complication: Secondary | ICD-10-CM | POA: Insufficient documentation

## 2019-11-07 DIAGNOSIS — E669 Obesity, unspecified: Secondary | ICD-10-CM | POA: Insufficient documentation

## 2019-11-08 NOTE — Patient Instructions (Signed)
1.  Take Humalog 6u before all meals, not just before breakfast 2.  Test blood sugar before all meals and at bedtime.  If blood sugars remain over 250, call office. Dietary instruction: 1.Stop all sweetened drinks.  Switch to flavored waters with no calories or diet drinks 2. Try Lite popcorn with less fat.   3. Limit mac and cheese to no more that 3 times/wk.  4.  Take 1 extra unit of Humalog insulin when eating spaggetti with garlic bread and limit bread to one slice. 5.  Return in 2 months.

## 2019-11-08 NOTE — Progress Notes (Signed)
Diet: See DSME assessment for dietary history. Medication:Taking Lantus 20u at 11PM,  Humalog 6u acB only. Exercise: none SBGM:  Has tested her blood sugar only once in the last 5 days.  FBS today was 261. Says has had cataract surgery in one eye, and will have the other eye done in 3 weeks.  Vision is blurry.   Low blood sugars: None Suggestions given: 1.  Take Humalog 6u before all meals, not just before breakfast 2.  Test blood sugar before all meals and at bedtime.  If blood sugars remain over 250, call office. Dietary instruction: 1.Stop all sweetened drinks.  Switch to flavored waters with no calories or diet drinks 2. Try Lite popcorn with less fat.   3. Limit mac and cheese to no more that 3 times/wk.  4.  Take 1 extra unit of Humalog insulin when eating spaggetti with garlic bread and limit bread to one slice. 5.  Return in 2 months.

## 2019-11-16 ENCOUNTER — Ambulatory Visit: Payer: Medicaid Other | Admitting: Family Medicine

## 2019-12-01 ENCOUNTER — Ambulatory Visit: Payer: Self-pay | Attending: Family Medicine | Admitting: Internal Medicine

## 2019-12-01 ENCOUNTER — Other Ambulatory Visit: Payer: Self-pay | Admitting: Internal Medicine

## 2019-12-01 ENCOUNTER — Other Ambulatory Visit: Payer: Self-pay

## 2019-12-01 ENCOUNTER — Encounter: Payer: Self-pay | Admitting: Internal Medicine

## 2019-12-01 VITALS — BP 126/85 | HR 108 | Temp 97.3°F | Resp 16 | Wt 180.0 lb

## 2019-12-01 DIAGNOSIS — E1169 Type 2 diabetes mellitus with other specified complication: Secondary | ICD-10-CM

## 2019-12-01 DIAGNOSIS — E669 Obesity, unspecified: Secondary | ICD-10-CM

## 2019-12-01 DIAGNOSIS — I10 Essential (primary) hypertension: Secondary | ICD-10-CM

## 2019-12-01 DIAGNOSIS — I5032 Chronic diastolic (congestive) heart failure: Secondary | ICD-10-CM

## 2019-12-01 DIAGNOSIS — Z1331 Encounter for screening for depression: Secondary | ICD-10-CM

## 2019-12-01 DIAGNOSIS — I480 Paroxysmal atrial fibrillation: Secondary | ICD-10-CM

## 2019-12-01 DIAGNOSIS — E785 Hyperlipidemia, unspecified: Secondary | ICD-10-CM

## 2019-12-01 MED ORDER — METOPROLOL SUCCINATE ER 25 MG PO TB24: 12.5000 mg | ORAL_TABLET | Freq: Every day | ORAL | 3 refills | Status: DC

## 2019-12-01 MED ORDER — INSULIN LISPRO (1 UNIT DIAL) 100 UNIT/ML (KWIKPEN): 7.0000 [IU] | PEN_INJECTOR | Freq: Three times a day (TID) | SUBCUTANEOUS | 11 refills | Status: DC

## 2019-12-01 MED ORDER — LANTUS SOLOSTAR 100 UNIT/ML ~~LOC~~ SOPN: 23.0000 [IU] | PEN_INJECTOR | Freq: Every day | SUBCUTANEOUS | 6 refills | Status: DC

## 2019-12-01 MED ORDER — TRUEPLUS PEN NEEDLES 32G X 4 MM MISC: 11 refills | Status: DC

## 2019-12-01 MED FILL — HUMALOG 100 UNITS/ML KWIKPE: 100 | 28 days supply | Qty: 6 | Fill #0

## 2019-12-01 MED FILL — METOPROLOL SUCCINATE ER 25: 25 | 30 days supply | Qty: 15 | Fill #0

## 2019-12-01 MED FILL — ?BASAGLAR 100 UNITS/ML KWPE: 100 | 26 days supply | Qty: 6 | Fill #0

## 2019-12-01 MED FILL — TRUEPLUS PEN NDL 32GX5/32: 32G X 4 MM | 30 days supply | Qty: 100 | Fill #0

## 2019-12-01 NOTE — Progress Notes (Signed)
Patient ID: Emily Farmer, female    DOB: 10-10-1955  MRN: [id number]  CC: Hypertension and Diabetes   Subjective: Emily Farmer is a 65 y.o. female who presents for 7 weeks follow-up which was to be with her PCP [name] however she is out of the office at this time. 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  Would like for her to be ordered again.  DIABETES TYPE 2 Last A1C:   Lab Results  Component Value Date   HGBA1C 12.4 (A) 09/23/2019  Patient followed by endocrinology.  She has an appointment coming up next month. Med Adherence:  '[x]'  Yes  -compliant with Metformin, Lantus 20 units daily and Humalog 6 units Medication side effects:  '[x]'  Yes - still gets nausea with Metformin    Home Monitoring?  '[x]'  Yes  - TID but did not bring log   Home glucose results range: running in the 200-300s.  BS this a.m before 343 Diet Adherence:  Doing better.  No sugar drinks. Tries to limit portion sizes of white carbs.  Up 9 lbs since 09/2019 Exercise: does a lot of walking at work.  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 '[]'  Yes    '[x]'  No but shape pains in feet Retinopathy hx? '[]'  Yes    '[]'  No Last eye exam: had cataract surgery 10/13/19 LT eye and will have RT done 12/15/2019.  Dr. Katy Fitch Comments:   HYPERTENSION/PAF/CHF Currently taking: see medication list. Taking Eliquis Med Adherence: '[x]'  Yes    '[]'  No Medication side effects: '[]'  Yes    '[x]'  No Adherence with salt restriction: '[x]'  Yes    '[]'  No Home Monitoring?: '[x]'  Yes     Monitoring Frequency:  daily Home BP results range:  170/60 SOB? '[]'  Yes    '[x]'  No Chest Pain?: '[]'  Yes    '[x]'  No Leg swelling?: '[]'  Yes    '[x]'  No Headaches?: '[]'  Yes    '[x]'  No Dizziness? '[]'  Yes    '[x]'   No Comments:  Palpitations.  No scale at home.  No blood in urine or stools  HL: Reports compliance with taking atorvastatin.  Lipid profile done in April was not at goal.  I suspected that patient was not compliant with the Lipitor so we held off on adding Lopid at that time.  PHQ9 screen positive today: However patient reports that this is not a major issue for her.  I note that she answered yes on the screening form to suicidal ideation.  However patient states that she does not have any suicidal thoughts.  She did not have her glasses with her today and was straining to see the questions on the form and thinks she answered them incorrectly.  Admits to feeling down at time and lacks energy.  She does not feel that she needs to be on any medication for depression or to see a counselor.   Patient Active Problem List   Diagnosis Date Noted  . Type 2 diabetes mellitus with diabetic polyneuropathy, with long-term current use of insulin ([place]) 09/23/2019  . Type 2 diabetes mellitus with hyperglycemia, with long-term current use of insulin (HCC)  09/23/2019  . Dyslipidemia 09/23/2019  . Toenail fungus 07/19/2018  . Depression 07/19/2018  . Iron deficiency anemia due to chronic blood loss 07/19/2018  . Gastritis and duodenitis 07/19/2018  . Atrial fibrillation with slow ventricular response ([place]) 01/28/2018  . Hyponatremia 01/28/2018  . Chronic diastolic CHF (congestive heart failure) ([place]) 01/28/2018  . Mixed hyperlipidemia 06/01/2017  . Paroxysmal A-fib ([place]) 10/04/2016  . Essential hypertension 02/15/2013  . Hypertriglyceridemia 02/15/2013  . Diabetes mellitus type 2 in obese ([place]) 02/15/2013  . Coronary atherosclerosis seen on CT 02/15/2013     Current Outpatient Medications on File Prior to Visit  Medication Sig Dispense Refill  . acetaminophen (TYLENOL) 325 MG tablet Take 2 tablets (650 mg total) by mouth every 6 (six) hours as needed for mild pain (or Fever >/= 101).    Marland Kitchen acetaminophen  (TYLENOL) 500 MG tablet Take 1,000 mg by mouth every 6 (six) hours as needed for moderate pain.    Marland Kitchen apixaban (ELIQUIS) 5 MG TABS tablet Take 1 tablet (5 mg total) by mouth 2 (two) times daily. 60 tablet 1  . atorvastatin (LIPITOR) 40 MG tablet Take 1 tablet (40 mg total) by mouth daily. 90 tablet 3  . Blood Glucose Monitoring Suppl (TRUE METRIX METER) w/Device KIT Use to monitor blood sugar as directed 1 kit 0  . ferrous sulfate 325 (65 FE) MG tablet Take 1 tablet (325 mg total) by mouth 2 (two) times daily with a meal. 180 tablet 3  . glucose blood (TRUE METRIX BLOOD GLUCOSE TEST) test strip Use as instructed to check blood sugars 3 times per day 100 each 11  . Insulin Syringe-Needle U-100 (TRUEPLUS INSULIN SYRINGE) 31G X 5/16" 0.3 ML MISC Use to inject insulin daily. 100 each 11  . metFORMIN (GLUCOPHAGE) 500 MG tablet Take 0.5 tablets (250 mg total) by mouth 4 (four) times daily. 60 tablet 2  . methocarbamol (ROBAXIN) 500 MG tablet Take 1 tablet (500 mg total) by mouth every 6 (six) hours as needed for muscle spasms. 90 tablet 0  . Multiple Vitamins-Minerals (CENTRUM SILVER PO) Take 1 tablet by mouth daily.    . mupirocin ointment (BACTROBAN) 2 % Place 1 application into the nose 2 (two) times daily. 30 g 0  . TRUEPLUS LANCETS 28G MISC Use to check blood sugar 3 times per day 100 each 11   No current facility-administered medications on file prior to visit.    No Known Allergies  Social History   Socioeconomic History  . Marital status: Single    Spouse name: Not on file  . Number of children: Not on file  . Years of education: Not on file  . Highest education level: Not on file  Occupational History  . Not on file  Tobacco Use  . Smoking status: Never Smoker  . Smokeless tobacco: Never Used  Vaping Use  . Vaping Use: Never used  Substance and Sexual Activity  . Alcohol use: No  . Drug use: No  . Sexual activity: Not Currently  Other Topics Concern  . Not on file  Social  History Narrative   Lives with her sister and does not need any assistance with ADLs.     Social Determinants of Health   Financial Resource Strain:   . Difficulty of Paying Living Expenses:   Food Insecurity:   . Worried About Charity fundraiser in the Last Year:   . Arboriculturist in the Last Year:   Transportation Needs:   .  Lack of Transportation (Medical):   Marland Kitchen Lack of Transportation (Non-Medical):   Physical Activity:   . Days of Exercise per Week:   . Minutes of Exercise per Session:   Stress:   . Feeling of Stress :   Social Connections:   . Frequency of Communication with Friends and Family:   . Frequency of Social Gatherings with Friends and Family:   . Attends Religious Services:   . Active Member of Clubs or Organizations:   . Attends Archivist Meetings:   Marland Kitchen Marital Status:   Intimate Partner Violence:   . Fear of Current or Ex-Partner:   . Emotionally Abused:   Marland Kitchen Physically Abused:   . Sexually Abused:     Family History  Problem Relation Age of Onset  . Heart attack Mother   . Heart attack Father   . Diabetes Sister   . High blood pressure Neg Hx   . High Cholesterol Neg Hx     Past Surgical History:  Procedure Laterality Date  . BIOPSY  06/20/2018   Procedure: BIOPSY;  Surgeon: Clarene Essex, MD;  Location: [facility] ENDOSCOPY;  Service: Endoscopy;;  . CHOLECYSTECTOMY N/A 01/04/2014   Procedure: LAPAROSCOPIC CHOLECYSTECTOMY WITH INTRAOPERATIVE CHOLANGIOGRAM;  Surgeon: Ralene Ok, MD;  Location: [place];  Service: General;  Laterality: N/A;  . COLONOSCOPY WITH PROPOFOL N/A 06/21/2018   Procedure: COLONOSCOPY WITH PROPOFOL;  Surgeon: Ronald Lobo, MD;  Location: [place];  Service: Endoscopy;  Laterality: N/A;  . ESOPHAGOGASTRODUODENOSCOPY (EGD) WITH PROPOFOL N/A 06/20/2018   Procedure: ESOPHAGOGASTRODUODENOSCOPY (EGD) WITH PROPOFOL;  Surgeon: Clarene Essex, MD;  Location: [place];  Service: Endoscopy;  Laterality: N/A;  . LUNG SURGERY       ROS: Review of Systems Negative except as stated above  PHYSICAL EXAM: BP 126/85   Pulse (!) 108   Temp (!) 97.3 F (36.3 C)   Resp 16   Wt 180 lb (81.6 kg)   SpO2 97%   BMI 31.89 kg/m   Wt Readings from Last 3 Encounters:  12/01/19 180 lb (81.6 kg)  09/23/19 171 lb (77.6 kg)  04/13/19 174 lb (78.9 kg)  BP 138/90 RUE   Physical Exam General appearance - alert, well appearing, older Caucasian female and in no distress Mental status - normal mood, behavior, speech, dress, motor activity, and thought processes Eyes - pupils equal and reactive, extraocular eye movements intact Neck - supple, no significant adenopathy Chest - clear to auscultation, no wheezes, rales or rhonchi, symmetric air entry Heart -irregularly irregular with pulse rate a little tachycardic Extremities no lower extremity edema.   Diabetic Foot Exam - Simple   Simple Foot Form Visual Inspection See comments: Yes Sensation Testing See comments: Yes Pulse Check Posterior Tibialis and Dorsalis pulse intact bilaterally: Yes Comments Patient is flat-footed.  She has decreased sensation on leap exam on some areas of the foot.  No ulcers or calluses seen.  Toenails are clipped too short and are thick and discolored.  She had Band-Aids on some of her toes which were removed for full view on exam.     Depression screen [facility] 2/9 12/01/2019 08/11/2019 08/17/2018  Decreased Interest 2 1 0  Down, Depressed, Hopeless 2 1 0  PHQ - 2 Score 4 2 0  Altered sleeping '2 1 1  ' Tired, decreased energy 2 - 0  Change in appetite 2 0 0  Feeling bad or failure about yourself  2 0 0  Trouble concentrating 2 0 0  Moving slowly or  fidgety/restless 2 0 0  Suicidal thoughts 2 0 0  PHQ-9 Score '18 3 1    ' CMP Latest Ref Rng & Units 09/23/2019 04/29/2019 04/13/2019  Glucose 70 - 99 mg/dL 374(H) 230(H) 307(H)  BUN 6 - 23 mg/dL 34(H) 27 33(H)  Creatinine 0.40 - 1.20 mg/dL 1.20 1.03(H) 1.27(H)  Sodium 135 - 145 mEq/L 130(L) 138  131(L)  Potassium 3.5 - 5.1 mEq/L 4.2 4.5 4.3  Chloride 96 - 112 mEq/L 93(L) 101 91(L)  CO2 19 - 32 mEq/L '25 21 23  ' Calcium 8.4 - 10.5 mg/dL 9.3 9.2 9.6  Total Protein 6.0 - 8.5 g/dL - - 6.9  Total Bilirubin 0.0 - 1.2 mg/dL - - <0.2  Alkaline Phos 39 - 117 IU/L - - 89  AST 0 - 40 IU/L - - 9  ALT 0 - 32 IU/L - - 11   Lipid Panel     Component Value Date/Time   CHOL 227 (H) 09/23/2019 0828   CHOL 347 (H) 04/13/2019 0948   TRIG (H) 09/23/2019 0828    446.0 Triglyceride is over 400; calculations on Lipids are invalid.   HDL 43.30 09/23/2019 0828   HDL 41 04/13/2019 0948   CHOLHDL 5 09/23/2019 0828   VLDL NOT CALC 11/21/2016 0822   LDLCALC 150 (H) 04/13/2019 0948   LDLDIRECT 96.0 09/23/2019 0828    CBC    Component Value Date/Time   WBC 9.1 04/13/2019 0948   WBC 7.1 06/26/2018 0328   RBC 4.14 04/13/2019 0948   RBC 3.45 (L) 06/26/2018 0328   HGB 11.6 04/13/2019 0948   HCT 36.8 04/13/2019 0948   PLT 276 04/13/2019 0948   MCV 89 04/13/2019 0948   MCH 28.0 04/13/2019 0948   MCH 23.5 (L) 06/26/2018 0328   MCHC 31.5 04/13/2019 0948   MCHC 30.7 06/26/2018 0328   RDW 12.9 04/13/2019 0948   LYMPHSABS 1.8 07/19/2018 0936   MONOABS 0.7 06/23/2018 0241   EOSABS 0.2 07/19/2018 0936   BASOSABS 0.0 07/19/2018 0936    ASSESSMENT AND PLAN:  1. Diabetes mellitus type 2 in obese [facility]) Not at goal.  Recommend increasing Lantus to 23 units daily and Humalog to 7 units with meals.  Keep follow-up appointment with endocrinologist next month.  Discussed and encourage healthy eating habits. - Insulin Pen Needle (TRUEPLUS PEN NEEDLES) 32G X 4 MM MISC; Use with insulin pen to inject Lantus daily.  Dispense: 100 each; Refill: 11 - insulin glargine (LANTUS SOLOSTAR) 100 UNIT/ML Solostar Pen; Inject 23 Units into the skin at bedtime.  Dispense: 30 mL; Refill: 6 - insulin lispro (HUMALOG KWIKPEN) 100 UNIT/ML KwikPen; Inject 0.07 mLs (7 Units total) into the skin 3 (three) times daily.  Dispense: 15  mL; Refill: 11  2. Essential hypertension She had been off blood pressure medicine because of episodes of hypertension and bradycardia.  However today blood pressure is elevated and she is in atrial fibrillation with tachycardia.  I will add a low-dose of metoprolol and have her follow-up with the clinical pharmacist in 1 week for repeat blood pressure and pulse check - metoprolol succinate (TOPROL-XL) 25 MG 24 hr tablet; Take 0.5 tablets (12.5 mg total) by mouth daily.  Dispense: 30 tablet; Refill: 3  3. Paroxysmal A-fib ([place]) See #2 above. - metoprolol succinate (TOPROL-XL) 25 MG 24 hr tablet; Take 0.5 tablets (12.5 mg total) by mouth daily.  Dispense: 30 tablet; Refill: 3  4. Chronic diastolic CHF (congestive heart failure) (HCC) Compensated.  5. Dyslipidemia Continue atorvastatin.  Will need lipid profile check on next visit  6. Positive depression screening Reports that she answered some of the questions and error due to poor vision.  I will still have our LCSW follow-up with her.   Patient was given the opportunity to ask questions.  Patient verbalized understanding of the plan and was able to repeat key elements of the plan.   No orders of the defined types were placed in this encounter.    Requested Prescriptions   Signed Prescriptions Disp Refills  . Insulin Pen Needle (TRUEPLUS PEN NEEDLES) 32G X 4 MM MISC 100 each 11    Sig: Use with insulin pen to inject Lantus daily.  . metoprolol succinate (TOPROL-XL) 25 MG 24 hr tablet 30 tablet 3    Sig: Take 0.5 tablets (12.5 mg total) by mouth daily.  . insulin glargine (LANTUS SOLOSTAR) 100 UNIT/ML Solostar Pen 30 mL 6    Sig: Inject 23 Units into the skin at bedtime.  . insulin lispro (HUMALOG KWIKPEN) 100 UNIT/ML KwikPen 15 mL 11    Sig: Inject 0.07 mLs (7 Units total) into the skin 3 (three) times daily.    Return in about 3 months (around 03/02/2020), or [name], for With [facility] in 1 wk for BP and pulse recheck.  Karle Plumber, MD, FACP

## 2019-12-01 NOTE — Patient Instructions (Signed)
We have increased the Lantus insulin to 23 units daily and the Humalog to 7 units with meals.  Your blood pressure and heart rate are elevated.  I have started a low-dose of the medication called metoprolol for you to take daily.  Please return in 1 week to see our clinical pharmacist for blood pressure and pulse recheck.

## 2019-12-08 ENCOUNTER — Ambulatory Visit: Payer: Self-pay | Attending: Family Medicine | Admitting: Pharmacist

## 2019-12-08 ENCOUNTER — Other Ambulatory Visit: Payer: Self-pay

## 2019-12-08 VITALS — BP 108/72 | HR 66

## 2019-12-08 DIAGNOSIS — I10 Essential (primary) hypertension: Secondary | ICD-10-CM

## 2019-12-08 NOTE — Progress Notes (Signed)
   S:    PCP: Dr. Jillyn Hidden  Patient arrives in good spirits. Presents to the clinic for hypertension evaluation, counseling, and management. Patient was referred on 12/01/2019 by Dr. Laural Benes. At that appointment, they added metoprolol 12.5 mg daily bc pt was found to be tachycardic.   PMH: HTN, T2DM, HLD, CHF, A fib, CAD  Today, she denies chest pain, dyspnea, HA or blurred vision. Denies palpitations or dizziness.   Medication adherence reported with metoprolol.She'll take an additional dose if she feels chest pain.   Current BP Medications include:  Metoprolol succinate 12.5 mg daily   Dietary habits include: compliant with salt restriction Exercise habits include: walks daily; tries to avoid inclines  Family / Social history:  - DM, MI - Never smoker  - Denies alcohol use   O:  Vitals:   12/08/19 0934  BP: 108/72  Pulse: 66     Home BP readings:  - 150s/70s - Reports HR 70s  Last 3 Office BP readings: BP Readings from Last 3 Encounters:  12/08/19 108/72  12/01/19 126/85  09/23/19 132/88   BMET    Component Value Date/Time   NA 130 (L) 09/23/2019 0828   NA 138 04/29/2019 0838   K 4.2 09/23/2019 0828   CL 93 (L) 09/23/2019 0828   CO2 25 09/23/2019 0828   GLUCOSE 374 (H) 09/23/2019 0828   BUN 34 (H) 09/23/2019 0828   BUN 27 04/29/2019 0838   CREATININE 1.20 09/23/2019 0828   CREATININE 0.64 11/21/2016 0822   CALCIUM 9.3 09/23/2019 0828   GFRNONAA 58 (L) 04/29/2019 0838   GFRAA 67 04/29/2019 0838    Renal function: CrCl cannot be calculated (Patient's most recent lab result is older than the maximum 21 days allowed.).  Clinical ASCVD: No  The 10-year ASCVD risk score Denman George DC Jr., et al., 2013) is: 9.2%   Values used to calculate the score:     Age: 3 years     Sex: Female     Is Non-Hispanic African American: No     Diabetic: Yes     Tobacco smoker: No     Systolic Blood Pressure: 108 mmHg     Is BP treated: Yes     HDL Cholesterol: 43.3 mg/dL      Total Cholesterol: 227 mg/dL  A/P: Hypertension longstanding currently at goal on current medications. BP Goal = < 130/80 mmHg. Medication adherence reported but I advised her to take only as prescribed. She will reach out to her Cardiologist to schedule an appointment today.   -Continued current medications.  -Counseled on lifestyle modifications for blood pressure control including reduced dietary sodium, increased exercise, adequate sleep.  Results reviewed and written information provided.   Total time in face-to-face counseling 15 minutes.   F/U Clinic Visit in September with PCP.   Butch Penny, PharmD, CPP Clinical Pharmacist [facility] [phone]

## 2019-12-09 ENCOUNTER — Encounter: Payer: Self-pay | Admitting: Pharmacist

## 2019-12-16 ENCOUNTER — Telehealth: Payer: Self-pay | Admitting: Licensed Clinical Social Worker

## 2019-12-16 NOTE — Telephone Encounter (Signed)
Call placed to patient regarding [facility] referral. LCSW introduced self and explained role at [facility]. Pt was informed of various services provided through [facility].   Pt denied need of any behavioral health or resources at this time. LCSW strongly encouraged pt to contact office should anything change. No additional concerns noted.

## 2019-12-23 ENCOUNTER — Encounter: Payer: Self-pay | Admitting: Internal Medicine

## 2019-12-23 ENCOUNTER — Ambulatory Visit (INDEPENDENT_AMBULATORY_CARE_PROVIDER_SITE_OTHER): Payer: Medicaid Other | Admitting: Internal Medicine

## 2019-12-23 ENCOUNTER — Other Ambulatory Visit: Payer: Self-pay

## 2019-12-23 VITALS — BP 108/68 | HR 93 | Ht 63.0 in | Wt 172.8 lb

## 2019-12-23 DIAGNOSIS — E1169 Type 2 diabetes mellitus with other specified complication: Secondary | ICD-10-CM

## 2019-12-23 DIAGNOSIS — Z794 Long term (current) use of insulin: Secondary | ICD-10-CM

## 2019-12-23 DIAGNOSIS — E1165 Type 2 diabetes mellitus with hyperglycemia: Secondary | ICD-10-CM

## 2019-12-23 DIAGNOSIS — E669 Obesity, unspecified: Secondary | ICD-10-CM

## 2019-12-23 DIAGNOSIS — E119 Type 2 diabetes mellitus without complications: Secondary | ICD-10-CM

## 2019-12-23 LAB — POCT GLYCOSYLATED HEMOGLOBIN (HGB A1C): Hemoglobin A1C: 12.3 % — AB (ref 4.0–5.6)

## 2019-12-23 MED ORDER — INSULIN LISPRO (1 UNIT DIAL) 100 UNIT/ML (KWIKPEN): 10.0000 [IU] | PEN_INJECTOR | Freq: Three times a day (TID) | SUBCUTANEOUS | 11 refills | Status: DC

## 2019-12-23 MED ORDER — LANTUS SOLOSTAR 100 UNIT/ML ~~LOC~~ SOPN: 24.0000 [IU] | PEN_INJECTOR | Freq: Every day | SUBCUTANEOUS | 6 refills | Status: DC

## 2019-12-23 MED FILL — ?BASAGLAR 100 UNITS/ML KWPE: 100 | 25 days supply | Qty: 6 | Fill #0

## 2019-12-23 MED FILL — ?HUMALOG 100 UNITS/ML KWIKP: 100 | 29 days supply | Qty: 9 | Fill #0

## 2019-12-23 NOTE — Patient Instructions (Signed)
-   Continue Metformin 500 mg , HALF a tablet three times daily  - Increase Lantus to 24 units daily  - Increase Novolog 10 units with EACH meal   - Check sugar before each meal and bring meter on next visit     HOW TO TREAT LOW BLOOD SUGARS (Blood sugar LESS THAN 70 MG/DL)  Please follow the RULE OF 15 for the treatment of hypoglycemia treatment (when your (blood sugars are less than 70 mg/dL)    STEP 1: Take 15 grams of carbohydrates when your blood sugar is low, which includes:   3-4 GLUCOSE TABS  OR  3-4 OZ OF JUICE OR REGULAR SODA OR  ONE TUBE OF GLUCOSE GEL     STEP 2: RECHECK blood sugar in 15 MINUTES STEP 3: If your blood sugar is still low at the 15 minute recheck --> then, go back to STEP 1 and treat AGAIN with another 15 grams of carbohydrates.

## 2019-12-23 NOTE — Progress Notes (Signed)
Name: Emily Farmer  Age/ Sex: 64 y.o., female   MRN/ DOB: [id number], 01/19/1956     PCP: Antony Blackbird, MD   Reason for Endocrinology Evaluation: Type 2 Diabetes Mellitus  Initial Endocrine Consultative Visit: 09/26/2019    PATIENT IDENTIFIER: Ms. Carlisia Farmer is a 64 y.o. female with a past medical history of DM, HTN, A.Fib  and dyslipidemia. The patient has followed with Endocrinology clinic since 09/26/2019 for consultative assistance with management of her diabetes.  DIABETIC HISTORY:  [name] was diagnosed with DM in 2013, has been on insulin and metformin since diagnosis. Her hemoglobin A1c has ranged from 9.5% in 2020, peaking at 12.2% in 2019    On initial visit to our clinic, her A1c was 12.4% . She was on Lantus and metformin and we added Novolog   Lives with sister  Assistant at a laundromat  SUBJECTIVE:   During the last visit (09/26/2019): A1c 12.4% , we started Novolog, continued metformin and lantus   Today (12/23/2019): Ms. Rodger is here for a follow up on diabetes management.   She checks her blood sugars 2 times daily. The patient has not had hypoglycemic episodes since the last clinic visit.  Drinks sweet  Tea in the morning.  Eats 3 meals a day       HOME DIABETES REGIMEN:   Lantus 20 units daily  Humalog 6 units 3 times daily before every meal  Continue metformin 500 mg, half a tablet 4 times daily-has been taking 3x a day     Statin: Yes ACE-I/ARB: no  METER DOWNLOAD SUMMARY:Unable to download  Bg's 140-445 mg/dL      DIABETIC COMPLICATIONS: Microvascular complications:   Fluctuating GFR, cataract, neuropathy   Last eye exam: Completed 08/2019  Macrovascular complications:    Denies: CAD, PVD, CVA     HISTORY:  Past Medical History:  Past Medical History:  Diagnosis Date  . Arthritis   . Atrial fibrillation ([place])    a. 09/2016 in setting of pancreatitis;  b. 09/2016 Echo: EF 65-60%, no rwma, Gr2 DD, mild MR, triv TR, PASP  74mHg;  CHA2DS2VASc = 4-->Eliquis 534mBID. c. Recurred 06/2018 in setting of GIB.  . Marland Kitchenhronic diastolic CHF (congestive heart failure) [place]  . Diabetes mellitus   . GI bleeding 06/2018  . Gout   . Hyperlipidemia   . Hypertension   . Hypertriglyceridemia   . Pancreatitis    a. 09/2016 - Triglycerides 1,392 on admission.   Past Surgical History:  Past Surgical History:  Procedure Laterality Date  . BIOPSY  06/20/2018   Procedure: BIOPSY;  Surgeon: MaClarene EssexMD;  [facility];  Service: Endoscopy;;  . CHOLECYSTECTOMY N/A 01/04/2014   Procedure: LAPAROSCOPIC CHOLECYSTECTOMY WITH INTRAOPERATIVE CHOLANGIOGRAM;  Surgeon: ArRalene OkMD;  [facility] Service: General;  Laterality: N/A;  . COLONOSCOPY WITH PROPOFOL N/A 06/21/2018   Procedure: COLONOSCOPY WITH PROPOFOL;  Surgeon: BuRonald LoboMD;  [facility]: Endoscopy;  Laterality: N/A;  . ESOPHAGOGASTRODUODENOSCOPY (EGD) WITH PROPOFOL N/A 06/20/2018   Procedure: ESOPHAGOGASTRODUODENOSCOPY (EGD) WITH PROPOFOL;  Surgeon: MaClarene EssexMD;  [facility]: Endoscopy;  Laterality: N/A;  . LUNG SURGERY      Social History:  reports that she has never smoked. She has never used smokeless tobacco. She reports that she does not drink alcohol and does not use drugs. Family History:  Family History  Problem Relation Age of Onset  . Heart attack Mother   .  Heart attack Father   . Diabetes Sister   . High blood pressure Neg Hx   . High Cholesterol Neg Hx      HOME MEDICATIONS: Allergies as of 12/23/2019   No Known Allergies     Medication List       Accurate as of December 23, 2019 10:42 AM. If you have any questions, ask your nurse or doctor.        acetaminophen 500 MG tablet Commonly known as: TYLENOL Take 1,000 mg by mouth every 6 (six) hours as needed for moderate pain.   acetaminophen 325 MG tablet Commonly known as: TYLENOL Take 2 tablets (650 mg total) by mouth  every 6 (six) hours as needed for mild pain (or Fever >/= 101).   apixaban 5 MG Tabs tablet Commonly known as: Eliquis Take 1 tablet (5 mg total) by mouth 2 (two) times daily.   atorvastatin 40 MG tablet Commonly known as: LIPITOR Take 1 tablet (40 mg total) by mouth daily.   CENTRUM SILVER PO Take 1 tablet by mouth daily.   ferrous sulfate 325 (65 FE) MG tablet Take 1 tablet (325 mg total) by mouth 2 (two) times daily with a meal.   glucose blood test strip Commonly known as: True Metrix Blood Glucose Test Use as instructed to check blood sugars 3 times per day   insulin lispro 100 UNIT/ML KwikPen Commonly known as: HumaLOG KwikPen Inject 0.1 mLs (10 Units total) into the skin 3 (three) times daily. What changed: how much to take Changed by: Dorita Sciara, MD   Insulin Syringe-Needle U-100 31G X 5/16" 0.3 ML Misc Commonly known as: TRUEplus Insulin Syringe Use to inject insulin daily.   Lantus SoloStar 100 UNIT/ML Solostar Pen Generic drug: insulin glargine Inject 24 Units into the skin at bedtime. What changed: how much to take Changed by: Dorita Sciara, MD   metFORMIN 500 MG tablet Commonly known as: GLUCOPHAGE Take 0.5 tablets (250 mg total) by mouth 4 (four) times daily.   methocarbamol 500 MG tablet Commonly known as: Robaxin Take 1 tablet (500 mg total) by mouth every 6 (six) hours as needed for muscle spasms.   metoprolol succinate 25 MG 24 hr tablet Commonly known as: TOPROL-XL Take 0.5 tablets (12.5 mg total) by mouth daily.   mupirocin ointment 2 % Commonly known as: BACTROBAN Place 1 application into the nose 2 (two) times daily.   True Metrix Meter w/Device Kit Use to monitor blood sugar as directed   TRUEplus Lancets 28G Misc Use to check blood sugar 3 times per day   TRUEplus Pen Needles 32G X 4 MM Misc Generic drug: Insulin Pen Needle Use with insulin pen to inject Lantus daily.        OBJECTIVE:   Vital Signs: BP  108/68 (BP Location: Left Arm, Patient Position: Sitting, Cuff Size: Large)   Pulse 93   Ht _0  (1.6 m)   Wt 172 lb 12.8 oz (78.4 kg)   SpO2 96%   BMI 30.61 kg/m   Wt Readings from Last 3 Encounters:  12/23/19 172 lb 12.8 oz (78.4 kg)  12/01/19 180 lb (81.6 kg)  09/23/19 171 lb (77.6 kg)     Exam: General: Pt appears well and is in NAD  Lungs: Clear with good BS bilat with no rales, rhonchi, or wheezes  Heart: RRR with normal S1 and S2 and no gallops; no murmurs; no rub  Extremities: No pretibial edema.   Neuro: MS is good  with appropriate affect, pt is alert and Ox3   DM foot exam: 09/23/2019  The skin of the feet is intact without sores or ulcerations, but has thickened and discolored toe nails The pedal pulses are 2+ on right and 2+ on left. The sensation is decreased to a screening 5.07, 10 gram monofilament bilaterally  DATA REVIEWED:  Lab Results  Component Value Date   HGBA1C 12.3 (A) 12/23/2019   HGBA1C 12.4 (A) 09/23/2019   HGBA1C 13.9 (A) 04/13/2019   Lab Results  Component Value Date   MICROALBUR 31.4 (H) 09/23/2019   LDLCALC 150 (H) 04/13/2019   CREATININE 1.20 09/23/2019   Lab Results  Component Value Date   MICRALBCREAT 27.9 09/23/2019     Lab Results  Component Value Date   CHOL 227 (H) 09/23/2019   HDL 43.30 09/23/2019   LDLCALC 150 (H) 04/13/2019   LDLDIRECT 96.0 09/23/2019   TRIG (H) 09/23/2019    446.0 Triglyceride is over 400; calculations on Lipids are invalid.   CHOLHDL 5 09/23/2019         ASSESSMENT / PLAN / RECOMMENDATIONS:   1) Type 2 Diabetes Mellitus, Poorly controlled, With neuropathic complications - Most recent A1c of 12.3 %. Goal A1c < 7.0 %.    -Patient continues with hyperglycemia despite starting 6 units of prandial insulin with each meal.  In review of her meter today her BG's fluctuate between 140--445 mg/DL.  It seems like her hypoglycemic episodes tend to happen in the latter part of the day, she denies snacking at  night, she only drinks sweet tea in the morning. -I am going to adjust her insulin as below -Patient assures me compliance with her insulin intake, she was coached by my CMA today on proper insulin pen use, which she has been doing appropriately.   MEDICATIONS: - Continue Metformin 500 mg , HALF a tablet three times daily  - Increase Lantus to 24 units daily  - Increase Humalog 10 units with EACH meal    EDUCATION / INSTRUCTIONS:  BG monitoring instructions: Patient is instructed to check her blood sugars 3 times a day, before meals.  Call [facility] if: BG persistently < 70 or > 300. . I reviewed the Rule of 15 for the treatment of hypoglycemia in detail with the patient. Literature supplied.    2) Diabetic complications:   Eye: Does not have known diabetic retinopathy.  Pending cataract surgery  Neuro/ Feet: Does have known diabetic peripheral neuropathy.  Renal: Patient does have known baseline CKD. She is not on an ACEI/ARB at present.    F/U in 4 months   Signed electronically by: Mack Guise, MD  [facility] Endocrinology  [facility] [address] [place], [place] [address] Phone: [phone] FAX: [phone]   CC: Antony Blackbird, MD [place] [address] Phone: [phone]  Fax: [phone]  Return to Endocrinology clinic as below: Future Appointments  Date Time Provider [place]  03/01/2020  9:30 AM Pixie Casino, MD [facility]  03/02/2020 10:30 AM Antony Blackbird, MD [facility] None  04/27/2020  9:30 AM Brandye Inthavong, Melanie Crazier, MD [facility] None

## 2020-01-05 MED FILL — METOPROLOL SUCCINATE ER 25: 25 | 30 days supply | Qty: 15 | Fill #1

## 2020-01-05 MED FILL — ?ATORVASTATIN 40MG TABLET: 40 | 30 days supply | Qty: 30 | Fill #5

## 2020-02-07 MED FILL — METOPROLOL SUCCINATE ER 25: 25 | 30 days supply | Qty: 15 | Fill #2

## 2020-03-01 ENCOUNTER — Ambulatory Visit (INDEPENDENT_AMBULATORY_CARE_PROVIDER_SITE_OTHER): Payer: Self-pay | Admitting: Internal Medicine

## 2020-03-01 ENCOUNTER — Encounter: Payer: Self-pay | Admitting: Internal Medicine

## 2020-03-01 ENCOUNTER — Other Ambulatory Visit: Payer: Self-pay

## 2020-03-01 VITALS — BP 111/76 | HR 115 | Ht 63.0 in | Wt 168.6 lb

## 2020-03-01 DIAGNOSIS — E119 Type 2 diabetes mellitus without complications: Secondary | ICD-10-CM

## 2020-03-01 DIAGNOSIS — I480 Paroxysmal atrial fibrillation: Secondary | ICD-10-CM

## 2020-03-01 DIAGNOSIS — I10 Essential (primary) hypertension: Secondary | ICD-10-CM

## 2020-03-01 DIAGNOSIS — E782 Mixed hyperlipidemia: Secondary | ICD-10-CM

## 2020-03-01 DIAGNOSIS — R0600 Dyspnea, unspecified: Secondary | ICD-10-CM

## 2020-03-01 MED ORDER — METOPROLOL SUCCINATE ER 25 MG PO TB24: 25.0000 mg | ORAL_TABLET | Freq: Every day | ORAL | 3 refills | Status: DC

## 2020-03-01 MED FILL — METOPROLOL SUCCINATE ER 25: 25 | 30 days supply | Qty: 30 | Fill #0

## 2020-03-01 NOTE — Patient Instructions (Signed)
Medication Instructions:   INCREASE METOPROLOL SUCCINATE TO 25 MG DAILY *If you need a refill on your cardiac medications before your next appointment, please call your pharmacy*  Lab Work: Your physician recommends that you return for lab work :   A1c  Direct LDL  FLP If you have labs (blood work) drawn today and your tests are completely normal, you will receive your results only by: Marland Kitchen MyChart Message (if you have MyChart) OR . A paper copy in the mail If you have any lab test that is abnormal or we need to change your treatment, we will call you to review the results.  Testing/Procedures: Your physician has requested that you have an echocardiogram. Echocardiography is a painless test that uses sound waves to create images of your heart. It provides your doctor with information about the size and shape of your heart and how well your heart's chambers and valves are working. This procedure takes approximately one hour. There are no restrictions for this procedure. THIS TEST IS PERFORMED AT [address]  Follow-Up: At [facility], you and your health needs are our priority.  As part of our continuing mission to provide you with exceptional heart care, we have created designated Provider Care Teams.  These Care Teams include your primary Cardiologist (physician) and Advanced Practice Providers (APPs -  Physician Assistants and Nurse Practitioners) who all work together to provide you with the care you need, when you need it.  We recommend signing up for the patient portal called "MyChart".  Sign up information is provided on this After Visit Summary.  MyChart is used to connect with patients for Virtual Visits (Telemedicine).  Patients are able to view lab/test results, encounter notes, upcoming appointments, etc.  Non-urgent messages can be sent to your provider as well.   To learn more about what you can do with MyChart, go to [email or web address].    Your next  appointment:   3 month(s)  The format for your next appointment:   In Person  Provider:   K. [place] Hilty, MD  Other Instructions

## 2020-03-01 NOTE — Progress Notes (Signed)
OFFICE FOLLOW-UP NOTE  Chief Complaint:  Dyspnea  Primary Care Physician: Antony Blackbird, MD  HPI:  Emily Farmer is a 64 y.o. female with a past medial history significant for recent admission for atrial fibrillation with rapid ventricular response in the setting of acute pancreatitis. Triglycerides were markedly elevated in the thousands. She was initially placed on heparin and fenofibrate. Subsequently she was found to have an elevated LDL-C and was started on atorvastatin 10 mg daily. She was seen in follow-up by one of our nurse practitioners and noted that she was doing fairly well and that her cholesterol numbers had improved somewhat. This was several months ago. Today she tells me that she's been out of her medications and she last saw him. She says that she has no health insurance and although she works cannot afford her medications. She is only currently taking insulin and metformin, but should be on Eliquis for stroke prevention, atorvastatin, fenofibrate, metoprolol and pantoprazole. Most recently her triglycerides went up to 959, but were as low as 374 in April when she was on medication. Denies any recurrent palpitations.  06/01/2017  Emily Farmer was seen today in follow-up.  Unfortunately she has been noncompliant with medical therapy.  She had repeat lab work which shows essentially no change in her lipid profile.  Triglycerides remain in the 800s.  She is not taking her statin, fenofibrate or omega-3 fatty acids.  The main issue is cost for her.  She is also noncompliant with Eliquis.  She is interested in samples today.  She says that she had Medicaid through her son however he aged out of that and the Medicaid went with him.  She is apparently not been able to obtain Medicaid for herself.  I am not sure if she looked into an orange card.  05/11/2018  Emily Farmer is seen today in follow-up.  Fortunately she is managed to get Medicaid.  This is helped significantly for medication  compliance.  After taking medications her numbers have improved significantly.  Her total cholesterol is dropped from 3 43-1 87.  Triglycerides from 816-163.  LDL is down 105.  She denies any recurrent pancreatitis.  Unfortunately she has had some atrial fibrillation.  He has been on Eliquis more consistently and underwent cardioversion in the ER over the summer.  She saw how main, PA-C in follow-up.  He increased her beta-blocker.  She appears to be in sinus rhythm today and denies any recurrent A. Fib.  09/02/2018  Emily Farmer was seen today in follow-up.  She was recently hospitalized in January with anemia and paroxysmal atrial fibrillation.  Work-up for the anemia was negative and she has been started on iron.  She is on Eliquis with no obvious sources of ongoing bleeding.  Overall she feels well.  Her only other complaint today is some numbness and tingling in the first 3 fingers of both hands which are intermittent.  It seems a little worse in the morning improves throughout the day.  She does do cleaning work and some repetitive activities.  03/01/2020  Emily Farmer is seen today in follow-up.  I saw her last via virtual visit in 2020.  Unfortunately she continues to have metabolic issues.  Lipids in April 2021 showed total cholesterol 227, HDL 43, direct LDL of 96, and triglycerides of 446 (down from 774).  She denies any recurrent episodes of pancreatitis.  Unfortunately her hemoglobin A1c was 12.3 in July.  Her insulin has been adjusted further by her endocrinologist.  When I last saw her she was on amiodarone for A. fib but had developed bradycardia in fact was in a sinus bradycardia.  Unfortunately the rate was slow and she was symptomatic therefore the amiodarone was discontinued.  Today she is now in A. fib with RVR.  More recently she was started on low-dose metoprolol XL 12.5 mg daily.  Heart rate 115 today with low normal blood pressure 111/76.  Her last echo in 2018 showed EF 60 to 65%.  She does  have a history of some chronic diastolic heart failure.  More recently she has had shortness of breath particular walking up hills.  This could be related to RVR or perhaps some cardiomyopathy.  PMHx:  Past Medical History:  Diagnosis Date  . Arthritis   . Atrial fibrillation ([place])    a. 09/2016 in setting of pancreatitis;  b. 09/2016 Echo: EF 65-60%, no rwma, Gr2 DD, mild MR, triv TR, PASP 24mHg;  CHA2DS2VASc = 4-->Eliquis 559mBID. c. Recurred 06/2018 in setting of GIB.  . Marland Kitchenhronic diastolic CHF (congestive heart failure) ([place]  . Diabetes mellitus   . GI bleeding 06/2018  . Gout   . Hyperlipidemia   . Hypertension   . Hypertriglyceridemia   . Pancreatitis    a. 09/2016 - Triglycerides 1,392 on admission.    Past Surgical History:  Procedure Laterality Date  . BIOPSY  06/20/2018   Procedure: BIOPSY;  Surgeon: MaClarene EssexMD;  [facility];  Service: Endoscopy;;  . CHOLECYSTECTOMY N/A 01/04/2014   Procedure: LAPAROSCOPIC CHOLECYSTECTOMY WITH INTRAOPERATIVE CHOLANGIOGRAM;  Surgeon: ArRalene OkMD;  [place] [facility] [place]: General;  Laterality: N/A;  . COLONOSCOPY WITH PROPOFOL N/A 06/21/2018   Procedure: COLONOSCOPY WITH PROPOFOL;  Surgeon: BuRonald LoboMD;  [place] [facility] [place] [facility]: [place];  Laterality: N/A;  . ESOPHAGOGASTRODUODENOSCOPY (EGD) WITH PROPOFOL N/A 06/20/2018   Procedure: ESOPHAGOGASTRODUODENOSCOPY (EGD) WITH PROPOFOL;  Surgeon: MaClarene EssexMD;  [place] [facility]: [place];  Laterality: N/A;  . LUNG SURGERY      FAMHx:  Family History  Problem Relation Age of Onset  . Heart attack Mother   . Heart attack Father   . Diabetes Sister   . High blood pressure Neg Hx   . High Cholesterol Neg Hx     SOCHx:   reports that she has never smoked. She has never used smokeless tobacco. She reports that she does not drink alcohol and does not use drugs.  ALLERGIES:  No Known Allergies  ROS: Pertinent items noted in HPI  and remainder of comprehensive ROS otherwise negative.  HOME MEDS: Current Outpatient Medications on File Prior to Visit  Medication Sig Dispense Refill  . acetaminophen (TYLENOL) 325 MG tablet Take 2 tablets (650 mg total) by mouth every 6 (six) hours as needed for mild pain (or Fever >/= 101).    . Marland Kitchenpixaban (ELIQUIS) 5 MG TABS tablet Take 1 tablet (5 mg total) by mouth 2 (two) times daily. 60 tablet 1  . atorvastatin (LIPITOR) 40 MG tablet Take 1 tablet (40 mg total) by mouth daily. 90 tablet 3  . Blood Glucose Monitoring Suppl (TRUE METRIX METER) w/Device KIT Use to monitor blood sugar as directed 1 kit 0  . ferrous sulfate 325 (65 FE) MG tablet Take 1 tablet (325 mg total) by mouth 2 (two) times daily with a meal. 180 tablet 3  . glucose blood (TRUE METRIX BLOOD GLUCOSE TEST) test strip Use as instructed to check blood sugars 3  times per day 100 each 11  . insulin glargine (LANTUS SOLOSTAR) 100 UNIT/ML Solostar Pen Inject 24 Units into the skin at bedtime. 30 mL 6  . insulin lispro (HUMALOG KWIKPEN) 100 UNIT/ML KwikPen Inject 0.1 mLs (10 Units total) into the skin 3 (three) times daily. 30 mL 11  . Insulin Pen Needle (TRUEPLUS PEN NEEDLES) 32G X 4 MM MISC Use with insulin pen to inject Lantus daily. 100 each 11  . Insulin Syringe-Needle U-100 (TRUEPLUS INSULIN SYRINGE) 31G X 5/16" 0.3 ML MISC Use to inject insulin daily. 100 each 11  . metFORMIN (GLUCOPHAGE) 500 MG tablet Take 0.5 tablets (250 mg total) by mouth 4 (four) times daily. 60 tablet 2  . methocarbamol (ROBAXIN) 500 MG tablet Take 1 tablet (500 mg total) by mouth every 6 (six) hours as needed for muscle spasms. 90 tablet 0  . metoprolol succinate (TOPROL-XL) 25 MG 24 hr tablet Take 0.5 tablets (12.5 mg total) by mouth daily. 30 tablet 3  . Multiple Vitamins-Minerals (CENTRUM SILVER PO) Take 1 tablet by mouth daily.    . mupirocin ointment (BACTROBAN) 2 % Place 1 application into the nose 2 (two) times daily. 30 g 0  . TRUEPLUS  LANCETS 28G MISC Use to check blood sugar 3 times per day 100 each 11   No current facility-administered medications on file prior to visit.    LABS/IMAGING: No results found for this or any previous visit (from the past 48 hour(s)). No results found.  LIPID PANEL:    Component Value Date/Time   CHOL 227 (H) 09/23/2019 0828   CHOL 347 (H) 04/13/2019 0948   TRIG (H) 09/23/2019 0828    446.0 Triglyceride is over 400; calculations on Lipids are invalid.   HDL 43.30 09/23/2019 0828   HDL 41 04/13/2019 0948   CHOLHDL 5 09/23/2019 0828   VLDL NOT CALC 11/21/2016 0822   LDLCALC 150 (H) 04/13/2019 0948   LDLDIRECT 96.0 09/23/2019 0828     WEIGHTS: Wt Readings from Last 3 Encounters:  03/01/20 168 lb 9.6 oz (76.5 kg)  12/23/19 172 lb 12.8 oz (78.4 kg)  12/01/19 180 lb (81.6 kg)    VITALS: BP 111/76   Pulse (!) 115   Ht '5\' 3"'  (1.6 m)   Wt 168 lb 9.6 oz (76.5 kg)   SpO2 98%   BMI 29.87 kg/m   EXAM: General appearance: alert and no distress Neck: no carotid bruit, no JVD and thyroid not enlarged, symmetric, no tenderness/mass/nodules Lungs: clear to auscultation bilaterally Heart: regular rate and rhythm, S1, S2 normal, no murmur, click, rub or gallop Abdomen: soft, non-tender; bowel sounds normal; no masses,  no organomegaly Extremities: extremities normal, atraumatic, no cyanosis or edema Pulses: 2+ and symmetric Skin: Skin color, texture, turgor normal. No rashes or lesions Neurologic: Grossly normal Psych: Pleasant  EKG: A. fib with RVR at 115-personally reviewed  ASSESSMENT: 1. Progressive dyspnea on exertion 2. Hyperchylomicronemia 3. Pancreatitis secondary to problem #1 4. Paroxysmal atrial fibrillation-CHADSVASC score 4 5. Type 2 diabetes on insulin -poorly controlled with A1c of 12 6. Essential hypertension 7. Possible carpal tunnel syndrome 8. Iron deficient anemia  PLAN: 1.   Mrs. Nilsen continues to struggle with uncontrolled diabetes, A. fib with RVR,  dyslipidemia and other medical problems.  Her lipids are somewhat better actually than they were 10 months ago and hopefully better control over her blood sugars will help with that.  I like to repeat her lipids including a direct LDL as well as A1c.  We will repeat an echocardiogram since her last study was in 2018 because of her progressive dyspnea on exertion to rule out a tachycardia induced cardiomyopathy.  I will likely make further adjustments on her lipid medications once we see her labs back.  Plan follow-up in 3 months or sooner as necessary.  Pixie Casino, MD, [facility], [place] Director of the [facility] Lipid [facility] &  Cardi[facility] Attending Cardiologist  Direct Dial: [phone]  Fax: [phone]  Website:  [email or web address] 03/01/2020, 9:36 AM

## 2020-03-02 ENCOUNTER — Other Ambulatory Visit: Payer: Self-pay | Admitting: Family Medicine

## 2020-03-02 ENCOUNTER — Telehealth: Payer: Self-pay

## 2020-03-02 ENCOUNTER — Encounter: Payer: Self-pay | Admitting: Family Medicine

## 2020-03-02 ENCOUNTER — Encounter (INDEPENDENT_AMBULATORY_CARE_PROVIDER_SITE_OTHER): Payer: Self-pay

## 2020-03-02 ENCOUNTER — Ambulatory Visit: Payer: Self-pay | Attending: Family Medicine | Admitting: Family Medicine

## 2020-03-02 VITALS — BP 142/93 | HR 120 | Ht 63.0 in | Wt 167.8 lb

## 2020-03-02 DIAGNOSIS — E1165 Type 2 diabetes mellitus with hyperglycemia: Secondary | ICD-10-CM

## 2020-03-02 DIAGNOSIS — Z7901 Long term (current) use of anticoagulants: Secondary | ICD-10-CM

## 2020-03-02 DIAGNOSIS — I4891 Unspecified atrial fibrillation: Secondary | ICD-10-CM

## 2020-03-02 DIAGNOSIS — R300 Dysuria: Secondary | ICD-10-CM

## 2020-03-02 DIAGNOSIS — Z794 Long term (current) use of insulin: Secondary | ICD-10-CM

## 2020-03-02 DIAGNOSIS — N3001 Acute cystitis with hematuria: Secondary | ICD-10-CM

## 2020-03-02 DIAGNOSIS — Z23 Encounter for immunization: Secondary | ICD-10-CM

## 2020-03-02 LAB — POCT URINALYSIS DIP (CLINITEK)
Bilirubin, UA: NEGATIVE
Glucose, UA: >=1000 mg/dL — AB
Ketones, POC UA: NEGATIVE mg/dL
Nitrite, UA: POSITIVE — AB
POC PROTEIN,UA: 100 — AB
Spec Grav, UA: 1.015
Urobilinogen, UA: 1 U/dL
pH, UA: 5

## 2020-03-02 LAB — POCT GLYCOSYLATED HEMOGLOBIN (HGB A1C): HbA1c POC (<> result, manual entry): >15 %

## 2020-03-02 LAB — LIPID PANEL
Chol/HDL Ratio: 5.3 ratio — ABNORMAL HIGH (ref 0.0–4.4)
Cholesterol, Total: 268 mg/dL — ABNORMAL HIGH (ref 100–199)
HDL: 51 mg/dL (ref >39)
LDL Chol Calc (NIH): 128 mg/dL — ABNORMAL HIGH (ref 0–99)
Triglycerides: 494 mg/dL — ABNORMAL HIGH (ref 0–149)
VLDL Cholesterol Cal: 89 mg/dL — ABNORMAL HIGH (ref 5–40)

## 2020-03-02 LAB — HEMOGLOBIN A1C
Est. average glucose Bld gHb Est-mCnc: >398 mg/dL
Hgb A1c MFr Bld: >15.5 % — ABNORMAL HIGH (ref 4.8–5.6)

## 2020-03-02 LAB — LDL CHOLESTEROL, DIRECT: LDL Direct: 137 mg/dL — ABNORMAL HIGH (ref 0–99)

## 2020-03-02 LAB — GLUCOSE, POCT (MANUAL RESULT ENTRY): POC Glucose: 277 mg/dL — AB (ref 70–99)

## 2020-03-02 MED ORDER — CEPHALEXIN 500 MG PO CAPS: 500.0000 mg | ORAL_CAPSULE | Freq: Three times a day (TID) | ORAL | 0 refills | Status: AC

## 2020-03-02 MED ORDER — LANTUS SOLOSTAR 100 UNIT/ML ~~LOC~~ SOPN: 28.0000 [IU] | PEN_INJECTOR | Freq: Every day | SUBCUTANEOUS | 6 refills | Status: DC

## 2020-03-02 MED ORDER — FLUCONAZOLE 100 MG PO TABS: 100.0000 mg | ORAL_TABLET | Freq: Every day | ORAL | 3 refills | Status: AC

## 2020-03-02 MED FILL — FLUCONAZOLE 100 MG TABLET: 100 | 3 days supply | Qty: 3 | Fill #0

## 2020-03-02 MED FILL — ?BASAGLAR 100 UNITS/ML KWPE: 100 | 32 days supply | Qty: 9 | Fill #0

## 2020-03-02 MED FILL — CEPHALEXIN 500 MG CAPSULE: 500 | 7 days supply | Qty: 21 | Fill #0

## 2020-03-02 NOTE — Progress Notes (Signed)
Established Patient Office Visit  Subjective:  Patient ID: Emily Farmer, female    DOB: 1956-05-05  Age: 64 y.o. MRN: [id number]  CC:  Chief Complaint  Patient presents with  . Diabetes    HPI Benigna Delisi, 64 year old female with uncontrolled diabetes, atrial fibrillation with rapid ventricular response, history of idiopathic pancreatitis, hyperlipidemia, anemia and long-term use of anticoagulant who presents for follow-up.  She reports that she has had a recent increase in urinary frequency and burning/vaginal itching with urination.  She reports that currently her blood sugars are fairly well controlled, generally in the 140s to 160s fasting.  She admits that she was out of medication/insulin for 1 to 2 months due to the cost but was recently able to restart her medications.  She was also out of medications for control of heart rate, anticoagulation and hyperlipidemia.  She reports that today she feels well other than the issues with dysuria.  She does not have the sensation of increased heart rate/palpitations.  She denies any chest pain.  She was having some shortness of breath when she saw her cardiologist but she feels that this is improved.  Past Medical History:  Diagnosis Date  . Arthritis   . Atrial fibrillation ([place])    a. 09/2016 in setting of pancreatitis;  b. 09/2016 Echo: EF 65-60%, no rwma, Gr2 DD, mild MR, triv TR, PASP 66mHg;  CHA2DS2VASc = 4-->Eliquis 533mBID. c. Recurred 06/2018 in setting of GIB.  . Marland Kitchenhronic diastolic CHF (congestive heart failure) ([place]15[place]2019  . Diabetes mellitus   . GI bleeding 06/2018  . Gout   . Hyperlipidemia   . Hypertension   . Hypertriglyceridemia   . Pancreatitis    a. 09/2016 - Triglycerides 1,392 on admission.    Past Surgical History:  Procedure Laterality Date  . BIOPSY  06/20/2018   Procedure: BIOPSY;  Surgeon: MaClarene EssexMD;  [facility];  Service: Endoscopy;;  . CHOLECYSTECTOMY N/A 01/04/2014   Procedure:  LAPAROSCOPIC CHOLECYSTECTOMY WITH INTRAOPERATIVE CHOLANGIOGRAM;  Surgeon: ArRalene OkMD;  [place] [facility] [facility];  Laterality: N/A;  . COLONOSCOPY WITH PROPOFOL N/A 06/21/2018   Procedure: COLONOSCOPY WITH PROPOFOL;  Surgeon: BuRonald LoboMD;  [facility]: [facility];  Laterality: N/A;  . ESOPHAGOGASTRODUODENOSCOPY (EGD) WITH PROPOFOL N/A 06/20/2018   Procedure: ESOPHAGOGASTRODUODENOSCOPY (EGD) WITH PROPOFOL;  Surgeon: MaClarene EssexMD;  [place]: Endoscopy;  Laterality: N/A;  . LUNG SURGERY      Family History  Problem Relation Age of Onset  . Heart attack Mother   . Heart attack Father   . Diabetes Sister   . High blood pressure Neg Hx   . High Cholesterol Neg Hx     Social History   Socioeconomic History  . Marital status: Single    Spouse name: Not on file  . Number of children: Not on file  . Years of education: Not on file  . Highest education level: Not on file  Occupational History  . Not on file  Tobacco Use  . Smoking status: Never Smoker  . Smokeless tobacco: Never Used  Vaping Use  . Vaping Use: Never used  Substance and Sexual Activity  . Alcohol use: No  . Drug use: No  . Sexual activity: Not Currently  Other Topics Concern  . Not on file  Social History Narrative   Lives with her sister and does not need any assistance with ADLs.     Social Determinants of  Health   Financial Resource Strain:   . Difficulty of Paying Living Expenses: Not on file  Food Insecurity:   . Worried About Charity fundraiser in the Last Year: Not on file  . Ran Out of Food in the Last Year: Not on file  Transportation Needs:   . Lack of Transportation (Medical): Not on file  . Lack of Transportation (Non-Medical): Not on file  Physical Activity:   . Days of Exercise per Week: Not on file  . Minutes of Exercise per Session: Not on file  Stress:   . Feeling of Stress : Not on file  Social Connections:   .  Frequency of Communication with Friends and Family: Not on file  . Frequency of Social Gatherings with Friends and Family: Not on file  . Attends Religious Services: Not on file  . Active Member of Clubs or Organizations: Not on file  . Attends Archivist Meetings: Not on file  . Marital Status: Not on file  Intimate Partner Violence:   . Fear of Current or Ex-Partner: Not on file  . Emotionally Abused: Not on file  . Physically Abused: Not on file  . Sexually Abused: Not on file    Outpatient Medications Prior to Visit  Medication Sig Dispense Refill  . acetaminophen (TYLENOL) 325 MG tablet Take 2 tablets (650 mg total) by mouth every 6 (six) hours as needed for mild pain (or Fever >/= 101).    Marland Kitchen apixaban (ELIQUIS) 5 MG TABS tablet Take 1 tablet (5 mg total) by mouth 2 (two) times daily. 60 tablet 1  . atorvastatin (LIPITOR) 40 MG tablet Take 1 tablet (40 mg total) by mouth daily. 90 tablet 3  . Blood Glucose Monitoring Suppl (TRUE METRIX METER) w/Device KIT Use to monitor blood sugar as directed 1 kit 0  . ferrous sulfate 325 (65 FE) MG tablet Take 1 tablet (325 mg total) by mouth 2 (two) times daily with a meal. 180 tablet 3  . glucose blood (TRUE METRIX BLOOD GLUCOSE TEST) test strip Use as instructed to check blood sugars 3 times per day 100 each 11  . insulin glargine (LANTUS SOLOSTAR) 100 UNIT/ML Solostar Pen Inject 24 Units into the skin at bedtime. 30 mL 6  . insulin lispro (HUMALOG KWIKPEN) 100 UNIT/ML KwikPen Inject 0.1 mLs (10 Units total) into the skin 3 (three) times daily. 30 mL 11  . Insulin Pen Needle (TRUEPLUS PEN NEEDLES) 32G X 4 MM MISC Use with insulin pen to inject Lantus daily. 100 each 11  . Insulin Syringe-Needle U-100 (TRUEPLUS INSULIN SYRINGE) 31G X 5/16" 0.3 ML MISC Use to inject insulin daily. 100 each 11  . metFORMIN (GLUCOPHAGE) 500 MG tablet Take 0.5 tablets (250 mg total) by mouth 4 (four) times daily. 60 tablet 2  . metoprolol succinate  (TOPROL-XL) 25 MG 24 hr tablet Take 1 tablet (25 mg total) by mouth daily. 90 tablet 3  . Multiple Vitamins-Minerals (CENTRUM SILVER PO) Take 1 tablet by mouth daily.    . mupirocin ointment (BACTROBAN) 2 % Place 1 application into the nose 2 (two) times daily. 30 g 0  . TRUEPLUS LANCETS 28G MISC Use to check blood sugar 3 times per day 100 each 11  . methocarbamol (ROBAXIN) 500 MG tablet Take 1 tablet (500 mg total) by mouth every 6 (six) hours as needed for muscle spasms. (Patient not taking: Reported on 03/02/2020) 90 tablet 0   No facility-administered medications prior to visit.  No Known Allergies  ROS Review of Systems  Constitutional: Positive for fatigue. Negative for chills and fever.  HENT: Negative for nosebleeds, sore throat and trouble swallowing.   Respiratory: Positive for shortness of breath (Improving). Negative for cough.   Cardiovascular: Negative for chest pain, palpitations and leg swelling.  Gastrointestinal: Positive for constipation (Occasional). Negative for abdominal pain, anal bleeding, blood in stool, diarrhea and nausea.  Endocrine: Positive for polydipsia and polyuria. Negative for polyphagia.  Genitourinary: Positive for dysuria and frequency. Negative for hematuria.  Musculoskeletal: Positive for back pain (Recurrent). Negative for arthralgias.  Skin: Negative for rash and wound.  Neurological: Negative for dizziness and headaches.  Hematological: Negative for adenopathy. Does not bruise/bleed easily.  Psychiatric/Behavioral: Negative for suicidal ideas. The patient is not nervous/anxious.       Objective:    Physical Exam Vitals and nursing note reviewed.  Constitutional:      General: She is not in acute distress.    Appearance: Normal appearance.  Neck:     Vascular: No carotid bruit.  Cardiovascular:     Rate and Rhythm: Tachycardia present. Rhythm irregular.     Comments: Mild tachycardia, appeared to be in an irregularly irregular rhythm  on auscultation Pulmonary:     Effort: Pulmonary effort is normal.     Breath sounds: Normal breath sounds.  Abdominal:     Palpations: Abdomen is soft.     Tenderness: There is abdominal tenderness (Mild suprapubic discomfort to palpation). There is no right CVA tenderness, left CVA tenderness, guarding or rebound.  Musculoskeletal:     Cervical back: Neck supple.     Right lower leg: No edema.     Left lower leg: No edema.  Lymphadenopathy:     Cervical: No cervical adenopathy.  Skin:    General: Skin is warm and dry.  Neurological:     General: No focal deficit present.     Mental Status: She is alert and oriented to person, place, and time.  Psychiatric:        Mood and Affect: Mood normal.        Behavior: Behavior normal.     BP (!) 142/93   Pulse (!) 120   Ht _0  (1.6 m)   Wt 167 lb 12.8 oz (76.1 kg)   SpO2 97%   BMI 29.72 kg/m  Wt Readings from Last 3 Encounters:  03/02/20 167 lb 12.8 oz (76.1 kg)  03/01/20 168 lb 9.6 oz (76.5 kg)  12/23/19 172 lb 12.8 oz (78.4 kg)     Health Maintenance Due  Topic Date Due  . PAP SMEAR-Modifier  Never done  . MAMMOGRAM  01/29/2012  . FOOT EXAM  07/20/2019  . INFLUENZA VACCINE  01/15/2020     Lab Results  Component Value Date   TSH 2.870 09/02/2018   Lab Results  Component Value Date   WBC 9.1 04/13/2019   HGB 11.6 04/13/2019   HCT 36.8 04/13/2019   MCV 89 04/13/2019   PLT 276 04/13/2019   Lab Results  Component Value Date   NA 130 (L) 09/23/2019   K 4.2 09/23/2019   CO2 25 09/23/2019   GLUCOSE 374 (H) 09/23/2019   BUN 34 (H) 09/23/2019   CREATININE 1.20 09/23/2019   BILITOT <0.2 04/13/2019   ALKPHOS 89 04/13/2019   AST 9 04/13/2019   ALT 11 04/13/2019   PROT 6.9 04/13/2019   ALBUMIN 4.3 04/13/2019   CALCIUM 9.3 09/23/2019   ANIONGAP 6 06/26/2018  GFR 45.45 (L) 09/23/2019   Lab Results  Component Value Date   CHOL 268 (H) 03/01/2020   Lab Results  Component Value Date   HDL 51 03/01/2020    Lab Results  Component Value Date   LDLCALC 128 (H) 03/01/2020   Lab Results  Component Value Date   TRIG 494 (H) 03/01/2020   Lab Results  Component Value Date   CHOLHDL 5.3 (H) 03/01/2020   Lab Results  Component Value Date   HGBA1C >15 03/02/2020      Assessment & Plan:  1. Type 2 diabetes mellitus with hyperglycemia, with long-term current use of insulin (HCC) Nonfasting glucose at today's visit was elevated at 277 and patient with hemoglobin A1c greater than 15.  She reports that she had been out of her medication for treatment of diabetes for period of 1 to 2 months but has been able to restart use of medication.  She reports the need for refill of Lantus.  Her dose of Lantus is being increased from 24 to 28 units and she is to continue Humalog as well.  Referral placed to see if patient can get a sooner appointment with her endocrinologist.  Will recheck electrolytes/renal function with BMP at today's visit.  Patient was made aware that before she runs out of medication completely that she should call or come into the office to speak with pharmacy.  Arrangements may be available to obtain samples or to let patient start an account that she can repay over time rather than going without medication completely. - POCT glucose (manual entry) - POCT glycosylated hemoglobin (Hb A1C) - POCT URINALYSIS DIP (CLINITEK) - Basic Metabolic Panel - Ambulatory referral to Endocrinology - insulin glargine (LANTUS SOLOSTAR) 100 UNIT/ML Solostar Pen; Inject 28 Units into the skin at bedtime.  Dispense: 30 mL; Refill: 6  2. Atrial fibrillation with rapid ventricular response (HCC) Patient with tachycardia with initial heart rate of 120 which decreased to just above normal on auscultation after patient had been sitting still.  She is reminded of the importance of taking medications to help control her heart rate as well as continuing use of Eliquis to help reduce the risk of stroke associated with  atrial fibrillation.  3. Dysuria; 6.  Acute cystitis with hematuria Patient with complaint of dysuria and vaginal itching.  Urinalysis was done at today's visit which was abnormal with both leukocytes, nitrites as well as trace RBCs.  Prescription provided for Keflex while urine culture is pending and she will be notified if a change in antibiotic therapy is needed based on the culture results.  She also has very poorly controlled diabetes which increases her risk for candidal infections.  Prescription also provided for Diflucan 100 mg once daily for 3 days.  She should return to clinic for reevaluation in the next 1 to 2 weeks if her symptoms have not improved, even sooner if symptoms worsen or she has any issues with the antibiotic. - POCT URINALYSIS DIP (CLINITEK) - Urine Culture - fluconazole (DIFLUCAN) 100 MG tablet; Take 1 tablet (100 mg total) by mouth daily for 3 days.  Dispense: 3 tablet; Refill: 3 - cephALEXin (KEFLEX) 500 MG capsule; Take 1 capsule (500 mg total) by mouth 3 (three) times daily for 7 days.  Dispense: 21 capsule; Refill: 0  4. Encounter for current long-term use of anticoagulants CBC will be done in follow-up of long-term use of anticoagulant medication and patient with history of anemia. - CBC  6.  Need for  immunization against influenza Influenza immunization provided at today's visit along with educational material on immunization.    Follow-up: Return in about 4 weeks (around 03/30/2020) for DM/chronic issues-sooner if blood sugars stay above 200 fasting or any concerns.    Antony Blackbird, MD

## 2020-03-02 NOTE — Telephone Encounter (Signed)
Patient name and DOB has been verified Patient was informed of lab results. Patient had no questions.  

## 2020-03-02 NOTE — Telephone Encounter (Signed)
-----   Message from Cain Saupe, MD sent at 03/02/2020 12:21 PM EDT ----- Abnormal UA. Rx was sent in for keflex. She will be contacted if a change is needed in her antibiotic therapy based on the culture results. Return for follow-up if continued urinary symptoms.

## 2020-03-03 LAB — CBC
Hematocrit: 47.6 % — ABNORMAL HIGH (ref 34.0–46.6)
Hemoglobin: 15.2 g/dL (ref 11.1–15.9)
MCH: 27.6 pg (ref 26.6–33.0)
MCHC: 31.9 g/dL (ref 31.5–35.7)
MCV: 87 fL (ref 79–97)
Platelets: 230 x10E3/uL (ref 150–450)
RBC: 5.5 x10E6/uL — ABNORMAL HIGH (ref 3.77–5.28)
RDW: 14.1 % (ref 11.7–15.4)
WBC: 9.5 x10E3/uL (ref 3.4–10.8)

## 2020-03-03 LAB — BASIC METABOLIC PANEL WITH GFR
BUN/Creatinine Ratio: 27 (ref 12–28)
BUN: 28 mg/dL — ABNORMAL HIGH (ref 8–27)
CO2: 25 mmol/L (ref 20–29)
Calcium: 9.8 mg/dL (ref 8.7–10.3)
Chloride: 94 mmol/L — ABNORMAL LOW (ref 96–106)
Creatinine, Ser: 1.05 mg/dL — ABNORMAL HIGH (ref 0.57–1.00)
GFR calc Af Amer: 66 mL/min/1.73
GFR calc non Af Amer: 57 mL/min/1.73 — ABNORMAL LOW
Glucose: 236 mg/dL — ABNORMAL HIGH (ref 65–99)
Potassium: 4.2 mmol/L (ref 3.5–5.2)
Sodium: 135 mmol/L (ref 134–144)

## 2020-03-06 ENCOUNTER — Other Ambulatory Visit: Payer: Self-pay | Admitting: Family Medicine

## 2020-03-06 DIAGNOSIS — E785 Hyperlipidemia, unspecified: Secondary | ICD-10-CM

## 2020-03-06 MED ORDER — EZETIMIBE 10 MG PO TABS: 10.0000 mg | ORAL_TABLET | Freq: Every day | ORAL | 3 refills | Status: DC

## 2020-03-06 MED FILL — EZETIMIBE 10 MG TABS: 10 | 30 days supply | Qty: 30 | Fill #0

## 2020-03-09 LAB — URINE CULTURE

## 2020-03-11 NOTE — Progress Notes (Signed)
Name: Emily Farmer  Age/ Sex: 64 y.o., female   MRN/ DOB: [id number], [birth year]     PCP: Antony Blackbird, MD   Reason for Endocrinology Evaluation: Type 2 Diabetes Mellitus  Initial Endocrine Consultative Visit: 09/26/2019    PATIENT IDENTIFIER: [name] is a 64 y.o. female with a past medical history of DM, HTN, A.Fib  and dyslipidemia. The patient has followed with Endocrinology clinic since 09/26/2019 for consultative assistance with management of her diabetes.  DIABETIC HISTORY:  [name] was diagnosed with DM in 2013, has been on insulin and metformin since diagnosis. Her hemoglobin A1c has ranged from 9.5% in 2020, peaking at 12.2% in 2019    On initial visit to our clinic, her A1c was 12.4% . She was on Lantus and metformin and we added Novolog   Lives with sister  Assistant at a laundromat  SUBJECTIVE:   During the last visit (12/23/2019): A1c 12.3% , we increased  Novolog, and lantus ,continued metformin    Today (03/12/2020): [name] is here for a follow up on diabetes management.   She checks her blood sugars occasionally . The patient has had hypoglycemic episodes since the last clinic visit.   Eats 3 meals a day  Has nausea and diarrhea that she attributes to metformin      HOME DIABETES REGIMEN:   Lantus 24 units daily- takes 28 units daily   Humalog 10 units 3 times daily before every meal- takes 7 units   Continue metformin 500 mg, half a tablet 3 times daily     Statin: Yes ACE-I/ARB: no  METER DOWNLOAD SUMMARY: Unable to download  Bg's 140-445 mg/dL      DIABETIC COMPLICATIONS: Microvascular complications:   Fluctuating GFR, cataract, neuropathy   Last eye exam: Completed 08/2019  Macrovascular complications:    Denies: CAD, PVD, CVA     HISTORY:  Past Medical History:  Past Medical History:  Diagnosis Date  . Arthritis   . Atrial fibrillation ([place])    a. 09/2016 in setting of pancreatitis;  b. 09/2016 Echo: EF 65-60%,  no rwma, Gr2 DD, mild MR, triv TR, PASP 11mHg;  CHA2DS2VASc = 4-->Eliquis 554mBID. c. Recurred 06/2018 in setting of GIB.  . Marland Kitchenhronic diastolic CHF (congestive heart failure) ([facility]8/15/2019  . Diabetes mellitus   . GI bleeding 06/2018  . Gout   . Hyperlipidemia   . Hypertension   . Hypertriglyceridemia   . Pancreatitis    a. 09/2016 - Triglycerides 1,392 on admission.   Past Surgical History:  Past Surgical History:  Procedure Laterality Date  . BIOPSY  06/20/2018   Procedure: BIOPSY;  Surgeon: MaClarene EssexMD;  [facility];  Service: Endoscopy;;  . CHOLECYSTECTOMY N/A 01/04/2014   Procedure: LAPAROSCOPIC CHOLECYSTECTOMY WITH INTRAOPERATIVE CHOLANGIOGRAM;  Surgeon: ArRalene OkMD;  [facility]: [facility];  Laterality: N/A;  . COLONOSCOPY WITH PROPOFOL N/A 06/21/2018   Procedure: COLONOSCOPY WITH PROPOFOL;  Surgeon: BuRonald LoboMD;  [facility];  Laterality: N/A;  . ESOPHAGOGASTRODUODENOSCOPY (EGD) WITH PROPOFOL N/A 06/20/2018   Procedure: ESOPHAGOGASTRODUODENOSCOPY (EGD) WITH PROPOFOL;  Surgeon: MaClarene EssexMD;  [facility]: Endoscopy;  Laterality: N/A;  . LUNG SURGERY      Social History:  reports that she has never smoked. She has never used smokeless tobacco. She reports that she does not drink alcohol and does not use drugs. Family History:  Family History  Problem Relation Age of  Onset  . Heart attack Mother   . Heart attack Father   . Diabetes Sister   . High blood pressure Neg Hx   . High Cholesterol Neg Hx      HOME MEDICATIONS: Allergies as of 03/12/2020   No Known Allergies     Medication List       Accurate as of March 12, 2020  8:48 AM. If you have any questions, ask your nurse or doctor.        acetaminophen 325 MG tablet Commonly known as: TYLENOL Take 2 tablets (650 mg total) by mouth every 6 (six) hours as needed for mild pain (or Fever >/= 101).   apixaban 5 MG Tabs  tablet Commonly known as: Eliquis Take 1 tablet (5 mg total) by mouth 2 (two) times daily.   atorvastatin 40 MG tablet Commonly known as: LIPITOR Take 1 tablet (40 mg total) by mouth daily.   CENTRUM SILVER PO Take 1 tablet by mouth daily.   ezetimibe 10 MG tablet Commonly known as: Zetia Take 1 tablet (10 mg total) by mouth daily. To help lower lipids   ferrous sulfate 325 (65 FE) MG tablet Take 1 tablet (325 mg total) by mouth 2 (two) times daily with a meal.   glucose blood test strip Commonly known as: True Metrix Blood Glucose Test Use as instructed to check blood sugars 3 times per day   insulin lispro 100 UNIT/ML KwikPen Commonly known as: HumaLOG KwikPen Inject 0.1 mLs (10 Units total) into the skin 3 (three) times daily.   Insulin Syringe-Needle U-100 31G X 5/16" 0.3 ML Misc Commonly known as: TRUEplus Insulin Syringe Use to inject insulin daily.   Lantus SoloStar 100 UNIT/ML Solostar Pen Generic drug: insulin glargine Inject 28 Units into the skin at bedtime.   metFORMIN 500 MG tablet Commonly known as: GLUCOPHAGE Take 0.5 tablets (250 mg total) by mouth 4 (four) times daily.   methocarbamol 500 MG tablet Commonly known as: Robaxin Take 1 tablet (500 mg total) by mouth every 6 (six) hours as needed for muscle spasms.   metoprolol succinate 25 MG 24 hr tablet Commonly known as: TOPROL-XL Take 1 tablet (25 mg total) by mouth daily.   mupirocin ointment 2 % Commonly known as: BACTROBAN Place 1 application into the nose 2 (two) times daily.   True Metrix Meter w/Device Kit Use to monitor blood sugar as directed   TRUEplus Lancets 28G Misc Use to check blood sugar 3 times per day   TRUEplus Pen Needles 32G X 4 MM Misc Generic drug: Insulin Pen Needle Use with insulin pen to inject Lantus daily.        OBJECTIVE:   Vital Signs: BP 134/82 (BP Location: Left Arm, Patient Position: Sitting, Cuff Size: Normal)   Pulse 84   Ht '5\' 3"'  (1.6 m)   Wt 173  lb 3.2 oz (78.6 kg)   SpO2 96%   BMI 30.68 kg/m   Wt Readings from Last 3 Encounters:  03/12/20 173 lb 3.2 oz (78.6 kg)  03/02/20 167 lb 12.8 oz (76.1 kg)  03/01/20 168 lb 9.6 oz (76.5 kg)     Exam: General: Pt appears well and is in NAD  Lungs: Clear with good BS bilat with no rales, rhonchi, or wheezes  Heart: RRR with normal S1 and S2 and no gallops; no murmurs; no rub  Extremities: 1+ pretibial edema.   Neuro: MS is good with appropriate affect, pt is alert and Ox3   DM  foot exam: 09/23/2019  The skin of the feet is intact without sores or ulcerations, but has thickened and discolored toe nails The pedal pulses are 2+ on right and 2+ on left. The sensation is decreased to a screening 5.07, 10 gram monofilament bilaterally  DATA REVIEWED:  Lab Results  Component Value Date   HGBA1C >15 03/02/2020   HGBA1C >15.5 (H) 03/01/2020   HGBA1C 12.3 (A) 12/23/2019   Lab Results  Component Value Date   MICROALBUR 31.4 (H) 09/23/2019   LDLCALC 128 (H) 03/01/2020   CREATININE 1.05 (H) 03/02/2020   Lab Results  Component Value Date   MICRALBCREAT 27.9 09/23/2019     Lab Results  Component Value Date   CHOL 268 (H) 03/01/2020   HDL 51 03/01/2020   LDLCALC 128 (H) 03/01/2020   LDLDIRECT 137 (H) 03/01/2020   TRIG 494 (H) 03/01/2020   CHOLHDL 5.3 (H) 03/01/2020         ASSESSMENT / PLAN / RECOMMENDATIONS:   1) Type 2 Diabetes Mellitus, Poorly controlled, With neuropathic complications - Most recent A1c of 14.6 %. Goal A1c < 7.0 %.    -With an A1c of 14.6% , the pt is not consistent with insulin intake, especially the prandial . She does take the basal insulin , at a dose higher then what was previously prescribed, hence the fasting hypoglycemia.  - I again have advised her to increase prandial insulin to 10 units - She is c/o loose stools that she attributes to metformin, will reduce as below - discussed adding a GLP-1 agonists , she is in agreement of this. Cautioned  against GI side effects     MEDICATIONS: - Decrease Metformin 500 mg , HALF a tabalet  BID - Decrease  Lantus to 24 units daily  - Increase Humalog to 10 units with EACH meal    EDUCATION / INSTRUCTIONS:  BG monitoring instructions: Patient is instructed to check her blood sugars 3 times a day, before meals.  Call [facility] if: BG persistently < 70 . I reviewed the Rule of 15 for the treatment of hypoglycemia in detail with the patient. Literature supplied.    2) Diabetic complications:   Eye: Does not have known diabetic retinopathy.  Pending cataract surgery  Neuro/ Feet: Does have known diabetic peripheral neuropathy.  Renal: Patient does have known baseline CKD. She is not on an ACEI/ARB at present.    F/U in 4 months   Signed electronically by: Mack Guise, MD  [facility]  [facility] [address] [place], [place] [address] Phone: [phone] FAX: [phone]   CC: Antony Blackbird, MD [place] [address] Phone: [phone]  Fax: [phone]  Return to Endocrinology clinic as below: Future Appointments  Date Time Provider [place]  03/12/2020  8:50 AM Nilo Fallin, Melanie Crazier, MD [facility] None  03/22/2020 10:15 AM [facility] ECHO 3 [id number]  04/18/2020  3:50 PM Fulp, Cammie, MD [facility] None  06/05/2020  9:00 AM [facility] LAB [facility] None  06/06/2020 10:15 AM Hilty, Nadean Corwin, MD [facility]

## 2020-03-12 ENCOUNTER — Ambulatory Visit (INDEPENDENT_AMBULATORY_CARE_PROVIDER_SITE_OTHER): Payer: Medicaid Other | Admitting: Internal Medicine

## 2020-03-12 ENCOUNTER — Encounter: Payer: Self-pay | Admitting: Internal Medicine

## 2020-03-12 ENCOUNTER — Other Ambulatory Visit: Payer: Self-pay

## 2020-03-12 ENCOUNTER — Other Ambulatory Visit: Payer: Self-pay | Admitting: Internal Medicine

## 2020-03-12 VITALS — BP 134/82 | HR 84 | Ht 63.0 in | Wt 173.2 lb

## 2020-03-12 DIAGNOSIS — Z794 Long term (current) use of insulin: Secondary | ICD-10-CM

## 2020-03-12 DIAGNOSIS — E1165 Type 2 diabetes mellitus with hyperglycemia: Secondary | ICD-10-CM

## 2020-03-12 LAB — POCT GLYCOSYLATED HEMOGLOBIN (HGB A1C): Hemoglobin A1C: 14.6 % — AB (ref 4.0–5.6)

## 2020-03-12 MED ORDER — VICTOZA 18 MG/3ML ~~LOC~~ SOPN: 0.6000 mg | PEN_INJECTOR | Freq: Every day | SUBCUTANEOUS | 6 refills | Status: DC

## 2020-03-12 MED FILL — ?ATORVASTATIN 40MG TABLET: 40 | 30 days supply | Qty: 30 | Fill #6

## 2020-03-12 MED FILL — ?METFORMIN HCL 500MG TABL: 500 | 30 days supply | Qty: 60 | Fill #1

## 2020-03-12 MED FILL — VICTOZA 18 MG/3 ML INJECT P: 18 | 30 days supply | Qty: 3 | Fill #0

## 2020-03-12 NOTE — Patient Instructions (Addendum)
-   Decrease  Metformin 500 mg , HALF a tablet twice  A day  - Decrease Lantus to 24 units daily  - Increase Humalog 10 units with EACH meal  - Victoza 0.6 mg daily      HOW TO TREAT LOW BLOOD SUGARS (Blood sugar LESS THAN 70 MG/DL)  Please follow the RULE OF 15 for the treatment of hypoglycemia treatment (when your (blood sugars are less than 70 mg/dL)    STEP 1: Take 15 grams of carbohydrates when your blood sugar is low, which includes:   3-4 GLUCOSE TABS  OR  3-4 OZ OF JUICE OR REGULAR SODA OR  ONE TUBE OF GLUCOSE GEL     STEP 2: RECHECK blood sugar in 15 MINUTES STEP 3: If your blood sugar is still low at the 15 minute recheck --> then, go back to STEP 1 and treat AGAIN with another 15 grams of carbohydrates.

## 2020-03-17 ENCOUNTER — Encounter (HOSPITAL_COMMUNITY): Payer: Self-pay | Admitting: Emergency Medicine

## 2020-03-17 ENCOUNTER — Emergency Department (HOSPITAL_COMMUNITY): Payer: Medicaid Other

## 2020-03-17 ENCOUNTER — Other Ambulatory Visit: Payer: Self-pay

## 2020-03-17 ENCOUNTER — Emergency Department (HOSPITAL_COMMUNITY)
Admission: EM | Admit: 2020-03-17 | Discharge: 2020-03-17 | Disposition: A | Discharge status: Home / Self Care | Payer: Medicaid Other | Attending: Emergency Medicine | Admitting: Emergency Medicine

## 2020-03-17 DIAGNOSIS — Z7984 Long term (current) use of oral hypoglycemic drugs: Secondary | ICD-10-CM | POA: Insufficient documentation

## 2020-03-17 DIAGNOSIS — Z7901 Long term (current) use of anticoagulants: Secondary | ICD-10-CM | POA: Insufficient documentation

## 2020-03-17 DIAGNOSIS — I110 Hypertensive heart disease with heart failure: Secondary | ICD-10-CM | POA: Insufficient documentation

## 2020-03-17 DIAGNOSIS — R0789 Other chest pain: Secondary | ICD-10-CM | POA: Insufficient documentation

## 2020-03-17 DIAGNOSIS — E1142 Type 2 diabetes mellitus with diabetic polyneuropathy: Secondary | ICD-10-CM | POA: Insufficient documentation

## 2020-03-17 DIAGNOSIS — I5032 Chronic diastolic (congestive) heart failure: Secondary | ICD-10-CM | POA: Insufficient documentation

## 2020-03-17 DIAGNOSIS — Z794 Long term (current) use of insulin: Secondary | ICD-10-CM | POA: Insufficient documentation

## 2020-03-17 DIAGNOSIS — Z79899 Other long term (current) drug therapy: Secondary | ICD-10-CM | POA: Insufficient documentation

## 2020-03-17 LAB — CBC
HCT: 42.6 % (ref 36.0–46.0)
Hemoglobin: 13.9 g/dL (ref 12.0–15.0)
MCH: 28.2 pg (ref 26.0–34.0)
MCHC: 32.6 g/dL (ref 30.0–36.0)
MCV: 86.4 fL (ref 80.0–100.0)
Platelets: 239 K/uL (ref 150–400)
RBC: 4.93 MIL/uL (ref 3.87–5.11)
RDW: 13.9 % (ref 11.5–15.5)
WBC: 7.9 K/uL (ref 4.0–10.5)
nRBC: 0 % (ref 0.0–0.2)

## 2020-03-17 LAB — BASIC METABOLIC PANEL
Anion gap: 12 (ref 5–15)
BUN: 21 mg/dL (ref 8–23)
CO2: 23 mmol/L (ref 22–32)
Calcium: 8.8 mg/dL — ABNORMAL LOW (ref 8.9–10.3)
Chloride: 99 mmol/L (ref 98–111)
Creatinine, Ser: 1.12 mg/dL — ABNORMAL HIGH (ref 0.44–1.00)
GFR calc Af Amer: >60 mL/min (ref >60)
GFR calc non Af Amer: 53 mL/min — ABNORMAL LOW (ref >60)
Glucose, Bld: 306 mg/dL — ABNORMAL HIGH (ref 70–99)
Potassium: 5 mmol/L (ref 3.5–5.1)
Sodium: 134 mmol/L — ABNORMAL LOW (ref 135–145)

## 2020-03-17 LAB — TROPONIN I (HIGH SENSITIVITY)
Troponin I (High Sensitivity): 10 ng/L (ref <18)
Troponin I (High Sensitivity): 7 ng/L (ref <18)

## 2020-03-17 NOTE — ED Provider Notes (Signed)
[facility] EMERGENCY DEPARTMENT Provider Note   CSN: [id number] Arrival date & time: 03/17/20  7262     History Chief Complaint  Patient presents with  . Chest Pain  . Atrial Fibrillation  . Shortness of Breath    Emily Farmer is a 64 y.o. female.  The history is provided by the patient.  Chest Pain Pain location:  R chest Pain quality: aching   Pain radiates to:  Does not radiate Pain severity:  Mild Onset quality:  Gradual Timing:  Intermittent Progression:  Waxing and waning Chronicity:  New Context: raising an arm   Relieved by:  Nothing Worsened by:  Movement (lifting things with right arm) Associated symptoms: no abdominal pain, no back pain, no cough, no fever, no palpitations, no shortness of breath and no vomiting   Risk factors: diabetes mellitus, high cholesterol and hypertension   Atrial Fibrillation Associated symptoms include chest pain. Pertinent negatives include no abdominal pain and no shortness of breath.  Shortness of Breath Associated symptoms: chest pain   Associated symptoms: no abdominal pain, no cough, no ear pain, no fever, no rash, no sore throat and no vomiting        Past Medical History:  Diagnosis Date  . Arthritis   . Atrial fibrillation ([place])    a. 09/2016 in setting of pancreatitis;  b. 09/2016 Echo: EF 65-60%, no rwma, Gr2 DD, mild MR, triv TR, PASP 26mHg;  CHA2DS2VASc = 4-->Eliquis 565mBID. c. Recurred 06/2018 in setting of GIB.  . Marland Kitchenhronic diastolic CHF (congestive heart failure) ([facility]/15/2019  . Diabetes mellitus   . GI bleeding 06/2018  . Gout   . Hyperlipidemia   . Hypertension   . Hypertriglyceridemia   . Pancreatitis    a. 09/2016 - Triglycerides 1,392 on admission.    Patient Active Problem List   Diagnosis Date Noted  . Type 2 diabetes mellitus with diabetic polyneuropathy, with long-term current use of insulin ([facility]04/02/2020  . Type 2 diabetes mellitus with hyperglycemia, with long-term  current use of insulin ([facility]04/02/2020  . Dyslipidemia 09/23/2019  . Toenail fungus 07/19/2018  . Depression 07/19/2018  . Iron deficiency anemia due to chronic blood loss 07/19/2018  . Gastritis and duodenitis 07/19/2018  . Atrial fibrillation with slow ventricular response ([facility]08/15/2019  . Hyponatremia 01/28/2018  . Chronic diastolic CHF (congestive heart failure) ([facility]08/15/2019  . Mixed hyperlipidemia 06/01/2017  . Paroxysmal A-fib ([facility]04/21/2018  . Essential hypertension 02/15/2013  . Hypertriglyceridemia 02/15/2013  . Diabetes mellitus type 2 in obese ([facility]CL[facility]09/07/2012  . Coronary atherosclerosis seen on CT 02/15/2013    Past Surgical History:  Procedure Laterality Date  . BIOPSY  06/20/2018   Procedure: BIOPSY;  Surgeon: MaClarene EssexMD;  [facility];  Service: Endoscopy;;  . CHOLECYSTECTOMY N/A 01/04/2014   Procedure: LAPAROSCOPIC CHOLECYSTECTOMY WITH INTRAOPERATIVE CHOLANGIOGRAM;  Surgeon: ArRalene OkMD;  [facility]: General;  Laterality: N/A;  . COLONOSCOPY WITH PROPOFOL N/A 06/21/2018   Procedure: COLONOSCOPY WITH PROPOFOL;  Surgeon: BuRonald LoboMD;  [facility]: Endoscopy;  Laterality: N/A;  . ESOPHAGOGASTRODUODENOSCOPY (EGD) WITH PROPOFOL N/A 06/20/2018   Procedure: ESOPHAGOGASTRODUODENOSCOPY (EGD) WITH PROPOFOL;  Surgeon: MaClarene EssexMD;  [facility]: Endoscopy;  Laterality: N/A;  . LUNG SURGERY       OB History   No obstetric history on file.     Family History  Problem Relation Age of Onset  . Heart attack Mother   .  Heart attack Father   . Diabetes Sister   . High blood pressure Neg Hx   . High Cholesterol Neg Hx     Social History   Tobacco Use  . Smoking status: Never Smoker  . Smokeless tobacco: Never Used  Vaping Use  . Vaping Use: Never used  Substance Use Topics  . Alcohol use: No  . Drug use: No    Home Medications Prior to Admission medications   Medication  Sig Start Date End Date Taking? Authorizing Provider  acetaminophen (TYLENOL) 325 MG tablet Take 2 tablets (650 mg total) by mouth every 6 (six) hours as needed for mild pain (or Fever >/= 101). 02/01/18   Elgergawy, Silver Huguenin, MD  apixaban (ELIQUIS) 5 MG TABS tablet Take 1 tablet (5 mg total) by mouth 2 (two) times daily. 09/27/19   Ladell Pier, MD  atorvastatin (LIPITOR) 40 MG tablet Take 1 tablet (40 mg total) by mouth daily. 04/18/19   Fulp, Cammie, MD  Blood Glucose Monitoring Suppl (TRUE METRIX METER) w/Device KIT Use to monitor blood sugar as directed 02/22/18   Fulp, Cammie, MD  ezetimibe (ZETIA) 10 MG tablet Take 1 tablet (10 mg total) by mouth daily. To help lower lipids 03/06/20   Fulp, Cammie, MD  ferrous sulfate 325 (65 FE) MG tablet Take 1 tablet (325 mg total) by mouth 2 (two) times daily with a meal. 04/13/19   Fulp, Cammie, MD  glucose blood (TRUE METRIX BLOOD GLUCOSE TEST) test strip Use as instructed to check blood sugars 3 times per day 02/22/18   Fulp, Cammie, MD  insulin glargine (LANTUS SOLOSTAR) 100 UNIT/ML Solostar Pen Inject 28 Units into the skin at bedtime. 03/02/20   Fulp, Cammie, MD  insulin lispro (HUMALOG KWIKPEN) 100 UNIT/ML KwikPen Inject 0.1 mLs (10 Units total) into the skin 3 (three) times daily. 12/23/19   Shamleffer, Melanie Crazier, MD  Insulin Pen Needle (TRUEPLUS PEN NEEDLES) 32G X 4 MM MISC Use with insulin pen to inject Lantus daily. 12/01/19   Ladell Pier, MD  Insulin Syringe-Needle U-100 (TRUEPLUS INSULIN SYRINGE) 31G X 5/16" 0.3 ML MISC Use to inject insulin daily. 05/06/18   Fulp, Cammie, MD  liraglutide (VICTOZA) 18 MG/3ML SOPN Inject 0.6 mg into the skin daily. 03/12/20   Shamleffer, Melanie Crazier, MD  metFORMIN (GLUCOPHAGE) 500 MG tablet Take 0.5 tablets (250 mg total) by mouth 4 (four) times daily. 09/27/19   Ladell Pier, MD  methocarbamol (ROBAXIN) 500 MG tablet Take 1 tablet (500 mg total) by mouth every 6 (six) hours as needed for muscle  spasms. 08/11/19   Argentina Donovan, PA-C  metoprolol succinate (TOPROL-XL) 25 MG 24 hr tablet Take 1 tablet (25 mg total) by mouth daily. 03/01/20   Hilty, Nadean Corwin, MD  Multiple Vitamins-Minerals (CENTRUM SILVER PO) Take 1 tablet by mouth daily.    [provider]  mupirocin ointment (BACTROBAN) 2 % Place 1 application into the nose 2 (two) times daily. 07/19/18   Elsie Stain, MD  TRUEPLUS LANCETS 28G MISC Use to check blood sugar 3 times per day 02/22/18   Antony Blackbird, MD    Allergies    Patient has no known allergies.  Review of Systems   Review of Systems  Constitutional: Negative for chills and fever.  HENT: Negative for ear pain and sore throat.   Eyes: Negative for pain and visual disturbance.  Respiratory: Negative for cough and shortness of breath.   Cardiovascular: Positive for chest pain.  Negative for palpitations.  Gastrointestinal: Negative for abdominal pain and vomiting.  Genitourinary: Negative for dysuria and hematuria.  Musculoskeletal: Negative for arthralgias and back pain.  Skin: Negative for color change and rash.  Neurological: Negative for seizures and syncope.  All other systems reviewed and are negative.   Physical Exam Updated Vital Signs BP (!) 132/94   Pulse 85   Temp 98.2 F (36.8 C) (Oral)   Resp 20   SpO2 100%   Physical Exam Vitals and nursing note reviewed.  Constitutional:      General: She is not in acute distress.    Appearance: She is well-developed.  HENT:     Head: Normocephalic and atraumatic.  Eyes:     Conjunctiva/sclera: Conjunctivae normal.     Pupils: Pupils are equal, round, and reactive to light.  Cardiovascular:     Rate and Rhythm: Normal rate and regular rhythm.     Pulses:          Radial pulses are 2+ on the right side and 2+ on the left side.     Heart sounds: Normal heart sounds. No murmur heard.   Pulmonary:     Effort: Pulmonary effort is normal. No respiratory distress.     Breath sounds: Normal  breath sounds. No decreased breath sounds or wheezing.  Chest:     Chest wall: Tenderness (TTP to right chest wall) present.  Abdominal:     Palpations: Abdomen is soft.     Tenderness: There is no abdominal tenderness.  Musculoskeletal:        General: Normal range of motion.     Cervical back: Normal range of motion and neck supple.     Right lower leg: No edema.     Left lower leg: No edema.  Skin:    General: Skin is warm and dry.     Capillary Refill: Capillary refill takes less than 2 seconds.  Neurological:     Mental Status: She is alert.     ED Results / Procedures / Treatments   Labs (all labs ordered are listed, but only abnormal results are displayed) Labs Reviewed  BASIC METABOLIC PANEL - Abnormal; Notable for the following components:      Result Value   Sodium 134 (*)    Glucose, Bld 306 (*)    Creatinine, Ser 1.12 (*)    Calcium 8.8 (*)    GFR calc non Af Amer 53 (*)    All other components within normal limits  CBC  TROPONIN I (HIGH SENSITIVITY)  TROPONIN I (HIGH SENSITIVITY)    EKG EKG Interpretation  Date/Time:  Saturday March 17 2020 08:27:46 EDT Ventricular Rate:  103 PR Interval:    QRS Duration: 68 QT Interval:  338 QTC Calculation: 442 R Axis:   43 Text Interpretation: Atrial fibrillation with rapid ventricular response Septal infarct , age undetermined Abnormal ECG Confirmed by Lennice Sites [phone]) on 03/17/2020 11:27:20 AM   Radiology DG Chest 2 View  Result Date: 03/17/2020 CLINICAL DATA:  Shortness of breath EXAM: CHEST - 2 VIEW COMPARISON:  June 25, 2018 FINDINGS: The cardiomediastinal silhouette is normal in contour. No pleural effusion. No pneumothorax. Surgical clips project over the RIGHT lower lung. Scattered bibasilar atelectasis. Visualized abdomen is unremarkable. Degenerative changes of the thoracic spine. IMPRESSION: No acute cardiopulmonary abnormality. Electronically Signed   By: Valentino Saxon MD   On: 03/17/2020  09:11    Procedures Procedures (including critical care time)  Medications Ordered in ED Medications -  No data to display  ED Course  I have reviewed the triage vital signs and the nursing notes.  Pertinent labs & imaging results that were available during my care of the patient were reviewed by me and considered in my medical decision making (see chart for details).    MDM Rules/Calculators/A&P                          Andreana Klingerman is a 64 year old female with history of A. fib on blood thinner, high cholesterol, diabetes, hypertension who presents to the ED with right-sided chest pain.  Reproducible chest pain.  Was worse after lifting objects throwing the trash today.  Pain was severe and called EMS.  Heart rate was mildly tachycardic with EMS and they gave her Cardizem.  However not having any palpitations.  No shortness of breath.  Normal vitals here.  Rate controlled A. fib on EKG.  No ischemic changes.  Has reproducible chest pain on exam.  Suspect musculoskeletal source.  Will get lab work and troponin to further evaluate for other cause of chest pain including ACS.  Doubt dissection given history and physical.  Good pulses throughout.  No signs of volume overload.  Initial lab work is unremarkable.  No significant anemia, electrolyte abnormality, kidney injury.  Troponin normal.  Repeat troponin normal.  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  Recommend, Tylenol, lidocaine patch.  Discharged in good condition.  Recommend light duty.  This chart was dictated using voice recognition software.  Despite best efforts to proofread,  errors can occur which can change the documentation meaning.    Final Clinical Impression(s) / ED Diagnoses Final diagnoses:  Chest wall pain    Rx / DC Orders ED Discharge Orders    None       Lennice Sites, DO 03/17/20 1605

## 2020-03-17 NOTE — ED Triage Notes (Signed)
Pt. Stated, I was taking the garbage and got light headed and SOB. I have A- fib.

## 2020-03-17 NOTE — ED Triage Notes (Signed)
EMS stated, pt. Got SOB while taking out the garbage she has A-Fib.  18g left AC, given Caedizem 10mg  and 500 bolus.  EMS stated, back in NSR

## 2020-03-17 NOTE — ED Notes (Signed)
Report to Emmy, RN 

## 2020-03-22 ENCOUNTER — Ambulatory Visit (HOSPITAL_COMMUNITY): Payer: Self-pay | Attending: Internal Medicine

## 2020-03-22 ENCOUNTER — Other Ambulatory Visit: Payer: Self-pay

## 2020-03-22 DIAGNOSIS — R0600 Dyspnea, unspecified: Secondary | ICD-10-CM | POA: Insufficient documentation

## 2020-03-22 DIAGNOSIS — I480 Paroxysmal atrial fibrillation: Secondary | ICD-10-CM | POA: Insufficient documentation

## 2020-03-22 LAB — ECHOCARDIOGRAM COMPLETE: S' Lateral: 2.5 cm

## 2020-03-27 ENCOUNTER — Other Ambulatory Visit: Payer: Self-pay | Admitting: Family Medicine

## 2020-03-27 DIAGNOSIS — Z794 Long term (current) use of insulin: Secondary | ICD-10-CM

## 2020-03-27 DIAGNOSIS — E1165 Type 2 diabetes mellitus with hyperglycemia: Secondary | ICD-10-CM

## 2020-03-27 MED FILL — TRUE METRIX TEST STRIP: 30 days supply | Qty: 100 | Fill #0

## 2020-03-27 MED FILL — TRUEplus LANCETS 28G MISC: 30 days supply | Qty: 100 | Fill #0

## 2020-03-27 MED FILL — TRUEplus 5-BEVEL PEN NEEDLE: 32G X 4 MM | 30 days supply | Qty: 100 | Fill #1

## 2020-03-27 NOTE — Telephone Encounter (Signed)
Requested Prescriptions  Pending Prescriptions Disp Refills  . TRUEplus Lancets 28G MISC [Pharmacy Med Name: TRUEplus LANCETS 28G MISC Miscellaneous] 100 each 11    Sig: USE TO CHECK BLOOD SUGAR 3 TIMES DAILY     Endocrinology: Diabetes - Testing Supplies Passed - 03/27/2020 10:07 AM      Passed - Valid encounter within last 12 months    Recent Outpatient Visits          3 weeks ago Type 2 diabetes mellitus with hyperglycemia, with long-term current use of insulin (HCC)   [facility] Fulp, [place], MD   3 months ago Essential hypertension   [facility] [place], [place] L, RPH-CPP   3 months ago Diabetes mellitus type 2 in obese [facility])   [facility] Jonah Blue B, MD   6 months ago Diabetes mellitus type 2 in obese [facility])   [facility] [facility] Marcine Matar, MD   7 months ago Muscle spasm   [facility] [place], Marzella Schlein, [place]      Future Appointments            In 3 weeks Cain Saupe, MD [facility]   In 2 months Hilty, Lisette Abu, MD [facility], [facility]           . glucose blood test strip [place] Med Name: TRUE METRIX TEST STRIP] 100 each 11    Sig: USE AS INSTRUCTED TO CHECK BLOOD SUGARS 3 TIMES DAILY     Endocrinology: Diabetes - Testing Supplies Passed - 03/27/2020 10:07 AM      Passed - Valid encounter within last 12 months    Recent Outpatient Visits          3 weeks ago Type 2 diabetes mellitus with hyperglycemia, with long-term current use of insulin (HCC)   [facility] Fulp, [place], MD   3 months ago Essential hypertension   [facility] [place], [place] L, RPH-CPP   3 months ago Diabetes mellitus type 2 in obese [facility])   [facility] Jonah Blue B, MD   6 months ago  Diabetes mellitus type 2 in obese [facility])   [facility] Marcine Matar, MD   7 months ago Muscle spasm   [facility] [place], Marzella Schlein, [place]      Future Appointments            In 3 weeks Cain Saupe, MD [facility]   In 2 months [name], Lisette Abu, MD [facility] [place], [facility]

## 2020-04-06 ENCOUNTER — Encounter: Payer: Self-pay | Admitting: Internal Medicine

## 2020-04-18 ENCOUNTER — Ambulatory Visit: Payer: Medicaid Other | Admitting: Family Medicine

## 2020-04-27 ENCOUNTER — Ambulatory Visit: Payer: Medicaid Other | Admitting: Internal Medicine

## 2020-05-02 ENCOUNTER — Ambulatory Visit: Payer: Medicaid Other | Admitting: Family Medicine

## 2020-05-03 ENCOUNTER — Ambulatory Visit: Payer: Medicaid Other | Admitting: Physician Assistant

## 2020-05-05 ENCOUNTER — Emergency Department (HOSPITAL_COMMUNITY): Payer: Self-pay

## 2020-05-05 ENCOUNTER — Encounter (HOSPITAL_COMMUNITY): Payer: Self-pay | Admitting: Emergency Medicine

## 2020-05-05 ENCOUNTER — Other Ambulatory Visit: Payer: Self-pay

## 2020-05-05 ENCOUNTER — Emergency Department (HOSPITAL_COMMUNITY)
Admission: EM | Admit: 2020-05-05 | Discharge: 2020-05-05 | Disposition: A | Discharge status: Home / Self Care | Payer: Self-pay | Attending: Emergency Medicine | Admitting: Emergency Medicine

## 2020-05-05 DIAGNOSIS — E1165 Type 2 diabetes mellitus with hyperglycemia: Secondary | ICD-10-CM | POA: Insufficient documentation

## 2020-05-05 DIAGNOSIS — Z794 Long term (current) use of insulin: Secondary | ICD-10-CM | POA: Insufficient documentation

## 2020-05-05 DIAGNOSIS — E1142 Type 2 diabetes mellitus with diabetic polyneuropathy: Secondary | ICD-10-CM | POA: Insufficient documentation

## 2020-05-05 DIAGNOSIS — I480 Paroxysmal atrial fibrillation: Secondary | ICD-10-CM

## 2020-05-05 DIAGNOSIS — Z7984 Long term (current) use of oral hypoglycemic drugs: Secondary | ICD-10-CM | POA: Insufficient documentation

## 2020-05-05 DIAGNOSIS — Z7901 Long term (current) use of anticoagulants: Secondary | ICD-10-CM | POA: Insufficient documentation

## 2020-05-05 DIAGNOSIS — Z79899 Other long term (current) drug therapy: Secondary | ICD-10-CM | POA: Insufficient documentation

## 2020-05-05 DIAGNOSIS — I5032 Chronic diastolic (congestive) heart failure: Secondary | ICD-10-CM | POA: Insufficient documentation

## 2020-05-05 DIAGNOSIS — I110 Hypertensive heart disease with heart failure: Secondary | ICD-10-CM | POA: Insufficient documentation

## 2020-05-05 DIAGNOSIS — I4891 Unspecified atrial fibrillation: Secondary | ICD-10-CM | POA: Insufficient documentation

## 2020-05-05 DIAGNOSIS — R42 Dizziness and giddiness: Secondary | ICD-10-CM | POA: Insufficient documentation

## 2020-05-05 LAB — BASIC METABOLIC PANEL
Anion gap: 13 (ref 5–15)
BUN: 29 mg/dL — ABNORMAL HIGH (ref 8–23)
CO2: 21 mmol/L — ABNORMAL LOW (ref 22–32)
Calcium: 9.1 mg/dL (ref 8.9–10.3)
Chloride: 103 mmol/L (ref 98–111)
Creatinine, Ser: 1.04 mg/dL — ABNORMAL HIGH (ref 0.44–1.00)
GFR, Estimated: >60 mL/min (ref >60)
Glucose, Bld: 218 mg/dL — ABNORMAL HIGH (ref 70–99)
Potassium: 3.8 mmol/L (ref 3.5–5.1)
Sodium: 137 mmol/L (ref 135–145)

## 2020-05-05 LAB — CBC
HCT: 42.6 % (ref 36.0–46.0)
Hemoglobin: 13.9 g/dL (ref 12.0–15.0)
MCH: 28.2 pg (ref 26.0–34.0)
MCHC: 32.6 g/dL (ref 30.0–36.0)
MCV: 86.4 fL (ref 80.0–100.0)
Platelets: 209 10*3/uL (ref 150–400)
RBC: 4.93 MIL/uL (ref 3.87–5.11)
RDW: 13.4 % (ref 11.5–15.5)
WBC: 8.8 10*3/uL (ref 4.0–10.5)
nRBC: 0 % (ref 0.0–0.2)

## 2020-05-05 LAB — MAGNESIUM: Magnesium: 1.3 mg/dL — ABNORMAL LOW (ref 1.7–2.4)

## 2020-05-05 LAB — TSH: TSH: 0.48 u[IU]/mL (ref 0.350–4.500)

## 2020-05-05 MED ORDER — METOPROLOL SUCCINATE ER 25 MG PO TB24
25.0000 mg | ORAL_TABLET | Freq: Once | ORAL | Status: AC
  Administered 2020-05-05: 25 mg via ORAL
  Filled 2020-05-05: qty 1

## 2020-05-05 MED ORDER — DILTIAZEM HCL 25 MG/5ML IV SOLN
10.0000 mg | Freq: Once | INTRAVENOUS | Status: AC
  Administered 2020-05-05: 10 mg via INTRAVENOUS
  Filled 2020-05-05: qty 5

## 2020-05-05 MED ORDER — MAGNESIUM SULFATE 2 GM/50ML IV SOLN
2.0000 g | Freq: Once | INTRAVENOUS | Status: AC
  Administered 2020-05-05: 2 g via INTRAVENOUS
  Filled 2020-05-05: qty 50

## 2020-05-05 MED ORDER — METOPROLOL TARTRATE 5 MG/5ML IV SOLN: 5.0000 mg | INTRAVENOUS | Status: DC | PRN

## 2020-05-05 NOTE — ED Provider Notes (Signed)
Care if received from [facility].  Please see her note for full HPI.  In short, 64 year old female with a history of paroxysmal A. fib with symptomatic A. fib w/ rate between 110-150.  Originally hypertensive, did not take her medications.  She is anticoagulated on Eliquis.  Prior work-up included BMP which did not show any significant abnormalities, creatinine of 1.04 which appears to be improved from prior.  Glucose of 218.  CBC without leukocytosis, normal hemoglobin.  Magnesium of 1.3 which was repleted here via IV.  Normal TSH.  Chest x-ray without active disease.  Patient was given her home metoprolol, rate between 110 and 120.  Patient states that she has had a cardioversion before.  Discussed this is an option.  As per discussion with supervising physician Dr. Lynelle Doctor, trialed 10 mg of IV Cardizem.  Rate improved to 80-90.  BPs remained stable.  Patient feeling better.  As per discussion with the patient, ambulatory referral to A. fib clinic was placed.  We will have her follow-up with them.  Return precautions discussed.  Patient voiced understanding and is agreeable.  At this stage in the ED course, the patient is medically screened and stable for discharge.  Case discussed with supervising physician Dr. Lynelle Doctor who is agreeable to the above plan and disposition.   Physical Exam  BP 107/72   Pulse 86   Temp 97.7 F (36.5 C) (Oral)   Resp 18   SpO2 99%   Physical Exam Vitals and nursing note reviewed.  Constitutional:      General: She is not in acute distress.    Appearance: She is well-developed. She is not ill-appearing, toxic-appearing or diaphoretic.  HENT:     Head: Normocephalic and atraumatic.     Mouth/Throat:     Mouth: Mucous membranes are moist.  Eyes:     Conjunctiva/sclera: Conjunctivae normal.  Cardiovascular:     Rate and Rhythm: Tachycardia present. Rhythm irregular.     Heart sounds: No murmur heard.   Pulmonary:     Effort: Pulmonary effort is normal. No  respiratory distress.     Breath sounds: Normal breath sounds.  Abdominal:     General: Abdomen is flat.     Palpations: Abdomen is soft.     Tenderness: There is no abdominal tenderness.  Musculoskeletal:        General: No deformity. Normal range of motion.     Cervical back: Neck supple.     Right lower leg: No edema.     Left lower leg: No edema.  Skin:    General: Skin is warm and dry.  Neurological:     General: No focal deficit present.     Mental Status: She is alert and oriented to person, place, and time.  Psychiatric:        Mood and Affect: Mood normal.        Behavior: Behavior normal.     ED Course/Procedures    Results for orders placed or performed during the hospital encounter of 05/05/20  Basic metabolic panel  Result Value Ref Range   Sodium 137 135 - 145 mmol/L   Potassium 3.8 3.5 - 5.1 mmol/L   Chloride 103 98 - 111 mmol/L   CO2 21 (L) 22 - 32 mmol/L   Glucose, Bld 218 (H) 70 - 99 mg/dL   BUN 29 (H) 8 - 23 mg/dL   Creatinine, Ser 1.57 (H) 0.44 - 1.00 mg/dL   Calcium 9.1 8.9 - 26.2 mg/dL  GFR, Estimated >60 >60 mL/min   Anion gap 13 5 - 15  Magnesium  Result Value Ref Range   Magnesium 1.3 (L) 1.7 - 2.4 mg/dL  CBC  Result Value Ref Range   WBC 8.8 4.0 - 10.5 K/uL   RBC 4.93 3.87 - 5.11 MIL/uL   Hemoglobin 13.9 12.0 - 15.0 g/dL   HCT 56.9 36 - 46 %   MCV 86.4 80.0 - 100.0 fL   MCH 28.2 26.0 - 34.0 pg   MCHC 32.6 30.0 - 36.0 g/dL   RDW 79.4 80.1 - 65.5 %   Platelets 209 150 - 400 K/uL   nRBC 0.0 0.0 - 0.2 %  TSH  Result Value Ref Range   TSH 0.480 0.350 - 4.500 uIU/mL   DG Chest Port 1 View  Result Date: 05/05/2020 CLINICAL DATA:  Dizziness EXAM: PORTABLE CHEST 1 VIEW COMPARISON:  03/17/2020 FINDINGS: The heart size and mediastinal contours are within normal limits. Both lungs are clear. The visualized skeletal structures are unremarkable. Clips over the right lower chest. IMPRESSION: No active disease. Electronically Signed   By: Jasmine Pang M.D.   On: 05/05/2020 16:24    Procedures  MDM         Mare Ferrari, PA-C 05/05/20 1754    Linwood Dibbles, MD 05/06/20 [phone]

## 2020-05-05 NOTE — ED Provider Notes (Signed)
[place] EMERGENCY DEPARTMENT Provider Note   CSN: [id number] Arrival date & time: 05/05/20  3825     History Chief Complaint  Patient presents with  . Dizziness    Emily Farmer is a 64 y.o. female.  64 year old female with a history of atrial fibrillation on chronic coagulation Eliquis presents emergency department chief complaint of right lightheadedness.  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  She sat down and states that she tried to catch her breath and started feeling better however when she got up she felt the same way.  She went in to tell her boss that she felt like her heart was acting up.  Has had similar episodes of the same with her atrial fibrillation has.  Bouncing around she has not gotten any medical is she denies A. Fib:11:44 AM Her metoprolol  The history is provided by the patient. No language interpreter was used.  Dizziness Quality:  Lightheadedness Severity:  Moderate Onset quality:  Sudden Duration:  3 hours Timing:  Constant Progression:  Worsening Chronicity:  Recurrent Context: physical activity and standing up   Relieved by:  Nothing Worsened by:  Standing up and movement Ineffective treatments:  None tried Associated symptoms: shortness of breath and weakness   Associated symptoms: no blood in stool, no chest pain, no nausea, no palpitations and no vomiting        Past Medical History:  Diagnosis Date  . Arthritis   . Atrial fibrillation ([place])    a. 09/2016 in setting of pancreatitis;  b. 09/2016 Echo: EF 65-60%, no rwma, Gr2 DD, mild MR, triv TR, PASP 37mHg;  CHA2DS2VASc = 4-->Eliquis 529mBID. c. Recurred 06/2018 in setting of GIB.  . Marland Kitchenhronic diastolic CHF (congestive heart failure) ([place]/15/2019  . Diabetes mellitus   . GI bleeding 06/2018  . Gout   . Hyperlipidemia   . Hypertension   . Hypertriglyceridemia   . Pancreatitis     a. 09/2016 - Triglycerides 1,392 on admission.    Patient Active Problem List   Diagnosis Date Noted  . Type 2 diabetes mellitus with diabetic polyneuropathy, with long-term current use of insulin (HCR[place]04/02/2020  . Type 2 diabetes mellitus with hyperglycemia, with long-term current use of insulin (HCMc[place]04/02/2020  . Dyslipidemia 09/23/2019  . Toenail fungus 07/19/2018  . Depression 07/19/2018  . Iron deficiency anemia due to chronic blood loss 07/19/2018  . Gastritis and duodenitis 07/19/2018  . Atrial fibrillation with slow ventricular response (HCR[place]08/15/2019  . Hyponatremia 01/28/2018  . Chronic diastolic CHF (congestive heart failure) (HCCor[place]08/15/2019  . Mixed hyperlipidemia 06/01/2017  . Paroxysmal A-fib ([place]04/21/2018  . Essential hypertension 02/15/2013  . Hypertriglyceridemia 02/15/2013  . Diabetes mellitus type 2 in obese (HCR[place]09/07/2012  . Coronary atherosclerosis seen on CT 02/15/2013    Past Surgical History:  Procedure Laterality Date  . BIOPSY  06/20/2018   Procedure: BIOPSY;  Surgeon: MaClarene EssexMD;  [facility];  Service: Endoscopy;;  . CHOLECYSTECTOMY N/A 01/04/2014   Procedure: LAPAROSCOPIC CHOLECYSTECTOMY WITH INTRAOPERATIVE CHOLANGIOGRAM;  Surgeon: ArRalene OkMD;  [facility];  Laterality: N/A;  . COLONOSCOPY WITH PROPOFOL N/A 06/21/2018   Procedure: COLONOSCOPY WITH PROPOFOL;  Surgeon: BuRonald LoboMD;  [facility]: [facility]oscopy;  Laterality: N/A;  . ESOPHAGOGASTRODUODENOSCOPY (EGD) WITH PROPOFOL N/A 06/20/2018   Procedure: ESOPHAGOGASTRODUODENOSCOPY (EGD) WITH PROPOFOL;  Surgeon: MaClarene Essex  MD;  Location: [place] ENDOSCOPY;  Service: Endoscopy;  Laterality: N/A;  . LUNG SURGERY       OB History   No obstetric history on file.     Family History  Problem Relation Age of Onset  . Heart attack Mother   . Heart attack Father   . Diabetes Sister   . High blood pressure Neg Hx   . High  Cholesterol Neg Hx     Social History   Tobacco Use  . Smoking status: Never Smoker  . Smokeless tobacco: Never Used  Vaping Use  . Vaping Use: Never used  Substance Use Topics  . Alcohol use: No  . Drug use: No    Home Medications Prior to Admission medications   Medication Sig Start Date End Date Taking? Authorizing Provider  acetaminophen (TYLENOL) 325 MG tablet Take 2 tablets (650 mg total) by mouth every 6 (six) hours as needed for mild pain (or Fever >/= 101). 02/01/18  Yes Elgergawy, Silver Huguenin, MD  apixaban (ELIQUIS) 5 MG TABS tablet Take 1 tablet (5 mg total) by mouth 2 (two) times daily. 09/27/19  Yes Ladell Pier, MD  atorvastatin (LIPITOR) 40 MG tablet Take 1 tablet (40 mg total) by mouth daily. 04/18/19  Yes Fulp, Cammie, MD  ezetimibe (ZETIA) 10 MG tablet Take 1 tablet (10 mg total) by mouth daily. To help lower lipids 03/06/20  Yes Fulp, Cammie, MD  ferrous sulfate 325 (65 FE) MG tablet Take 1 tablet (325 mg total) by mouth 2 (two) times daily with a meal. 04/13/19  Yes Fulp, Cammie, MD  insulin glargine (LANTUS SOLOSTAR) 100 UNIT/ML Solostar Pen Inject 28 Units into the skin at bedtime. Patient taking differently: Inject 24 Units into the skin at bedtime.  03/02/20  Yes Fulp, Cammie, MD  insulin lispro (HUMALOG KWIKPEN) 100 UNIT/ML KwikPen Inject 0.1 mLs (10 Units total) into the skin 3 (three) times daily. 12/23/19  Yes Shamleffer, Melanie Crazier, MD  liraglutide (VICTOZA) 18 MG/3ML SOPN Inject 0.6 mg into the skin daily. 03/12/20  Yes Shamleffer, Melanie Crazier, MD  metFORMIN (GLUCOPHAGE) 500 MG tablet Take 0.5 tablets (250 mg total) by mouth 4 (four) times daily. 09/27/19  Yes Ladell Pier, MD  metoprolol succinate (TOPROL-XL) 25 MG 24 hr tablet Take 1 tablet (25 mg total) by mouth daily. 03/01/20  Yes Hilty, Nadean Corwin, MD  Multiple Vitamins-Minerals (CENTRUM SILVER PO) Take 1 tablet by mouth daily.   Yes [provider]  mupirocin ointment (BACTROBAN) 2 %  Place 1 application into the nose 2 (two) times daily. 07/19/18  Yes Elsie Stain, MD  Blood Glucose Monitoring Suppl (TRUE METRIX METER) w/Device KIT Use to monitor blood sugar as directed 02/22/18   Fulp, Cammie, MD  glucose blood test strip USE AS INSTRUCTED TO CHECK BLOOD SUGARS 3 TIMES DAILY 03/27/20   Fulp, Cammie, MD  Insulin Pen Needle (TRUEPLUS PEN NEEDLES) 32G X 4 MM MISC Use with insulin pen to inject Lantus daily. 12/01/19   Ladell Pier, MD  Insulin Syringe-Needle U-100 (TRUEPLUS INSULIN SYRINGE) 31G X 5/16" 0.3 ML MISC Use to inject insulin daily. 05/06/18   Fulp, Cammie, MD  methocarbamol (ROBAXIN) 500 MG tablet Take 1 tablet (500 mg total) by mouth every 6 (six) hours as needed for muscle spasms. Patient not taking: Reported on 05/05/2020 08/11/19   Argentina Donovan, PA-C  TRUEplus Lancets 28G MISC USE TO CHECK BLOOD SUGAR 3 TIMES DAILY 03/27/20   Antony Blackbird, MD  Allergies    Patient has no known allergies.  Review of Systems   Review of Systems  Constitutional: Negative for chills and fever.  HENT: Negative.   Eyes: Negative.   Respiratory: Positive for shortness of breath.   Cardiovascular: Negative for chest pain and palpitations.  Gastrointestinal: Negative for anal bleeding, blood in stool, nausea and vomiting.  Genitourinary: Negative.   Musculoskeletal: Negative.   Neurological: Positive for dizziness and weakness.    Physical Exam Updated Vital Signs BP (!) 163/97   Pulse (!) 114   Temp 97.7 F (36.5 C) (Oral)   Resp 15   SpO2 95%   Physical Exam Vitals and nursing note reviewed.  Constitutional:      General: She is not in acute distress.    Appearance: She is well-developed. She is not diaphoretic.  HENT:     Head: Normocephalic and atraumatic.  Eyes:     General: No scleral icterus.    Conjunctiva/sclera: Conjunctivae normal.  Cardiovascular:     Rate and Rhythm: Normal rate and regular rhythm.     Heart sounds: Normal heart sounds.  No murmur heard.  No friction rub. No gallop.   Pulmonary:     Effort: Pulmonary effort is normal. No respiratory distress.     Breath sounds: Normal breath sounds.  Abdominal:     General: Bowel sounds are normal. There is no distension.     Palpations: Abdomen is soft. There is no mass.     Tenderness: There is no abdominal tenderness. There is no guarding.  Musculoskeletal:     Cervical back: Normal range of motion.  Skin:    General: Skin is warm and dry.  Neurological:     Mental Status: She is alert and oriented to person, place, and time.  Psychiatric:        Behavior: Behavior normal.     ED Results / Procedures / Treatments   Labs (all labs ordered are listed, but only abnormal results are displayed) Labs Reviewed - No data to display  EKG None  Radiology No results found.  Procedures Procedures (including critical care time)  Medications Ordered in ED Medications - No data to display  ED Course  I have reviewed the triage vital signs and the nursing notes.  Pertinent labs & imaging results that were available during my care of the patient were reviewed by me and considered in my medical decision making (see chart for details).    MDM Rules/Calculators/A&P                          Patient has a CHA2DS2-VASc score of 93 63 year old female with known history of paroxysmal atrial fibrillation who went into A. fib this morning at work.  Her rate has been intermittently elevated, mostly above 100.  She has been in A. fib throughout her visit.  She is not normally in atrial fibrillation.  She is given a dose of her oral metoprolol in hopes that this would resolve her issue however she remains in A. fib.  Currently awaiting patient's urinalysis.  I ordered and reviewed labs which show elevated blood glucose on the BMP, no other significant abnormalities, some minor renal insufficiency.  Patient's CBC without abnormality, TSH within normal limits, mag level low and  repleted via IV.  Patient's urine is currently pending.  EKG shows A. fib at a rate of 102. Patient is currently awaiting analysis of these mental status.  Have given  signout to [place]. Final Clinical Impression(s) / ED Diagnoses Final diagnoses:  None    Rx / DC Orders ED Discharge Orders    None       Margarita Mail, PA-C 05/05/20 1546    Breck Coons, MD 05/06/20 [phone]

## 2020-05-05 NOTE — ED Notes (Signed)
Pt discharged via wheelchair. All questions and concerns addressed. No complaints at this time.  ° °

## 2020-05-05 NOTE — ED Notes (Signed)
Patient ambulated independently in room with steady gait.

## 2020-05-05 NOTE — Discharge Instructions (Signed)
Please make sure to follow-up with the A. fib clinic.  You should be receiving a call from them sometime next week.  If you do not receive a call, please call the number provided.  Return to the ER for any new or worsening symptoms.

## 2020-05-05 NOTE — ED Triage Notes (Signed)
Pt BIB [facility] from work. Pt complaint of moving boxes at work and sudden feeling of dizziness. Pt endorsed feeling like her afib was acting up. Pt was afib with rate 108-158 on EMS arrival. Pt received 250 fluids, rate down to 88-112. VSS. NAD.

## 2020-05-08 ENCOUNTER — Encounter (HOSPITAL_COMMUNITY): Payer: Self-pay | Admitting: Physician Assistant

## 2020-05-08 ENCOUNTER — Other Ambulatory Visit: Payer: Self-pay

## 2020-05-08 ENCOUNTER — Ambulatory Visit (HOSPITAL_COMMUNITY)
Admission: RE | Admit: 2020-05-08 | Discharge: 2020-05-08 | Disposition: A | Discharge status: Home / Self Care | Payer: Self-pay | Source: Ambulatory Visit | Attending: Physician Assistant | Admitting: Physician Assistant

## 2020-05-08 ENCOUNTER — Other Ambulatory Visit (HOSPITAL_COMMUNITY): Payer: Self-pay | Admitting: Physician Assistant

## 2020-05-08 VITALS — BP 124/88 | HR 118 | Ht 63.0 in | Wt 167.8 lb

## 2020-05-08 DIAGNOSIS — E119 Type 2 diabetes mellitus without complications: Secondary | ICD-10-CM | POA: Insufficient documentation

## 2020-05-08 DIAGNOSIS — I10 Essential (primary) hypertension: Secondary | ICD-10-CM | POA: Insufficient documentation

## 2020-05-08 DIAGNOSIS — D509 Iron deficiency anemia, unspecified: Secondary | ICD-10-CM | POA: Insufficient documentation

## 2020-05-08 DIAGNOSIS — Z7901 Long term (current) use of anticoagulants: Secondary | ICD-10-CM | POA: Insufficient documentation

## 2020-05-08 DIAGNOSIS — D6869 Other thrombophilia: Secondary | ICD-10-CM | POA: Insufficient documentation

## 2020-05-08 DIAGNOSIS — I4819 Other persistent atrial fibrillation: Secondary | ICD-10-CM | POA: Insufficient documentation

## 2020-05-08 DIAGNOSIS — Z7984 Long term (current) use of oral hypoglycemic drugs: Secondary | ICD-10-CM | POA: Insufficient documentation

## 2020-05-08 DIAGNOSIS — Z794 Long term (current) use of insulin: Secondary | ICD-10-CM | POA: Insufficient documentation

## 2020-05-08 DIAGNOSIS — E785 Hyperlipidemia, unspecified: Secondary | ICD-10-CM | POA: Insufficient documentation

## 2020-05-08 DIAGNOSIS — Z79899 Other long term (current) drug therapy: Secondary | ICD-10-CM | POA: Insufficient documentation

## 2020-05-08 DIAGNOSIS — Z8249 Family history of ischemic heart disease and other diseases of the circulatory system: Secondary | ICD-10-CM | POA: Insufficient documentation

## 2020-05-08 MED ORDER — METOPROLOL SUCCINATE ER 25 MG PO TB24: 25.0000 mg | ORAL_TABLET | Freq: Two times a day (BID) | ORAL | 3 refills | Status: DC

## 2020-05-08 MED FILL — METOPROLOL SUCCINATE ER 25: 25 | 30 days supply | Qty: 60 | Fill #0

## 2020-05-08 NOTE — Patient Instructions (Signed)
Increase metoprolol to 25mg twice a day

## 2020-05-08 NOTE — Progress Notes (Signed)
Primary Care Physician: Antony Blackbird, MD Primary Cardiologist: Dr Debara Pickett Primary Electrophysiologist: none Referring Physician: [facility]   Emily Farmer is a 64 y.o. female with a history of HTN, HLD, iron deficiency anemia, DM, and persistent atrial fibrillation who presents for follow up in the [facility]. The patient was initially diagnosed with atrial fibrillation in 2018 in the setting of acute pancreatitis. She was hospitalized 06/2018 with profound anemia and afib with RVR. No clear source of bleeding identified. She was started on amiodarone but this was discontinued 2/2 bradycardia. Patient is on Eliquis for a CHADS2VASC score of 3. She reports she is unaware of her arrhythmia. She denies significant snoring or alcohol use.   Today, she denies symptoms of palpitations, chest pain, shortness of breath, orthopnea, PND, lower extremity edema, dizziness, presyncope, syncope, snoring, daytime somnolence, bleeding, or neurologic sequela. The patient is tolerating medications without difficulties and is otherwise without complaint today.    Atrial Fibrillation Risk Factors:  she does not have symptoms or diagnosis of sleep apnea. she does not have a history of rheumatic fever. she does not have a history of alcohol use.   she has a BMI of Body mass index is 29.72 kg/m.Marland Kitchen Filed Weights   05/08/20 1341  Weight: 76.1 kg    Family History  Problem Relation Age of Onset  . Heart attack Mother   . Heart attack Father   . Diabetes Sister   . High blood pressure Neg Hx   . High Cholesterol Neg Hx      Atrial Fibrillation Management history:  Previous antiarrhythmic drugs: amiodarone  Previous cardioversions: none Previous ablations: none CHADS2VASC score: 3 Anticoagulation history: Eliquis   Past Medical History:  Diagnosis Date  . Arthritis   . Atrial fibrillation ([place])    a. 09/2016 in setting of pancreatitis;  b. 09/2016 Echo: EF 65-60%,  no rwma, Gr2 DD, mild MR, triv TR, PASP 51mHg;  CHA2DS2VASc = 4-->Eliquis 534mBID. c. Recurred 06/2018 in setting of GIB.  . Marland Kitchenhronic diastolic CHF (congestive heart failure) ([place]8/15/2019  . Diabetes mellitus   . GI bleeding 06/2018  . Gout   . Hyperlipidemia   . Hypertension   . Hypertriglyceridemia   . Pancreatitis    a. 09/2016 - Triglycerides 1,392 on admission.   Past Surgical History:  Procedure Laterality Date  . BIOPSY  06/20/2018   Procedure: BIOPSY;  Surgeon: MaClarene EssexMD;  [facility];  Service: Endoscopy;;  . CHOLECYSTECTOMY N/A 01/04/2014   Procedure: LAPAROSCOPIC CHOLECYSTECTOMY WITH INTRAOPERATIVE CHOLANGIOGRAM;  Surgeon: ArRalene OkMD;  [facility] Service: General;  Laterality: N/A;  . COLONOSCOPY WITH PROPOFOL N/A 06/21/2018   Procedure: COLONOSCOPY WITH PROPOFOL;  Surgeon: BuRonald LoboMD;  [facility]: [facility];  Laterality: N/A;  . ESOPHAGOGASTRODUODENOSCOPY (EGD) WITH PROPOFOL N/A 06/20/2018   Procedure: ESOPHAGOGASTRODUODENOSCOPY (EGD) WITH PROPOFOL;  Surgeon: MaClarene EssexMD;  [facility] Service: Endoscopy;  Laterality: N/A;  . LUNG SURGERY      Current Outpatient Medications  Medication Sig Dispense Refill  . acetaminophen (TYLENOL) 325 MG tablet Take 2 tablets (650 mg total) by mouth every 6 (six) hours as needed for mild pain (or Fever >/= 101).    . Marland Kitchenpixaban (ELIQUIS) 5 MG TABS tablet Take 1 tablet (5 mg total) by mouth 2 (two) times daily. 60 tablet 1  . atorvastatin (LIPITOR) 40 MG tablet Take 1 tablet (40 mg total) by mouth daily.  90 tablet 3  . Blood Glucose Monitoring Suppl (TRUE METRIX METER) w/Device KIT Use to monitor blood sugar as directed 1 kit 0  . ezetimibe (ZETIA) 10 MG tablet Take 1 tablet (10 mg total) by mouth daily. To help lower lipids 90 tablet 3  . ferrous sulfate 325 (65 FE) MG tablet Take 1 tablet (325 mg total) by mouth 2 (two) times daily with a meal. 180 tablet 3  .  glucose blood test strip USE AS INSTRUCTED TO CHECK BLOOD SUGARS 3 TIMES DAILY 100 each 11  . insulin glargine (LANTUS SOLOSTAR) 100 UNIT/ML Solostar Pen Inject 28 Units into the skin at bedtime. (Patient taking differently: Inject 24 Units into the skin at bedtime. ) 30 mL 6  . insulin lispro (HUMALOG KWIKPEN) 100 UNIT/ML KwikPen Inject 0.1 mLs (10 Units total) into the skin 3 (three) times daily. 30 mL 11  . Insulin Pen Needle (TRUEPLUS PEN NEEDLES) 32G X 4 MM MISC Use with insulin pen to inject Lantus daily. 100 each 11  . Insulin Syringe-Needle U-100 (TRUEPLUS INSULIN SYRINGE) 31G X 5/16" 0.3 ML MISC Use to inject insulin daily. 100 each 11  . liraglutide (VICTOZA) 18 MG/3ML SOPN Inject 0.6 mg into the skin daily. 3 mL 6  . metFORMIN (GLUCOPHAGE) 500 MG tablet Take 0.5 tablets (250 mg total) by mouth 4 (four) times daily. 60 tablet 2  . methocarbamol (ROBAXIN) 500 MG tablet Take 1 tablet (500 mg total) by mouth every 6 (six) hours as needed for muscle spasms. 90 tablet 0  . metoprolol succinate (TOPROL-XL) 25 MG 24 hr tablet Take 1 tablet (25 mg total) by mouth daily. 90 tablet 3  . Multiple Vitamins-Minerals (CENTRUM SILVER PO) Take 1 tablet by mouth daily.    . mupirocin ointment (BACTROBAN) 2 % Place 1 application into the nose 2 (two) times daily. 30 g 0  . TRUEplus Lancets 28G MISC USE TO CHECK BLOOD SUGAR 3 TIMES DAILY 100 each 11   No current facility-administered medications for this encounter.    No Known Allergies  Social History   Socioeconomic History  . Marital status: Single    Spouse name: Not on file  . Number of children: Not on file  . Years of education: Not on file  . Highest education level: Not on file  Occupational History  . Not on file  Tobacco Use  . Smoking status: Never Smoker  . Smokeless tobacco: Never Used  Vaping Use  . Vaping Use: Never used  Substance and Sexual Activity  . Alcohol use: No  . Drug use: No  . Sexual activity: Not Currently    Other Topics Concern  . Not on file  Social History Narrative   Lives with her sister and does not need any assistance with ADLs.     Social Determinants of Health   Financial Resource Strain:   . Difficulty of Paying Living Expenses: Not on file  Food Insecurity:   . Worried About Charity fundraiser in the Last Year: Not on file  . Ran Out of Food in the Last Year: Not on file  Transportation Needs:   . Lack of Transportation (Medical): Not on file  . Lack of Transportation (Non-Medical): Not on file  Physical Activity:   . Days of Exercise per Week: Not on file  . Minutes of Exercise per Session: Not on file  Stress:   . Feeling of Stress : Not on file  Social Connections:   . Frequency  of Communication with Friends and Family: Not on file  . Frequency of Social Gatherings with Friends and Family: Not on file  . Attends Religious Services: Not on file  . Active Member of Clubs or Organizations: Not on file  . Attends Archivist Meetings: Not on file  . Marital Status: Not on file  Intimate Partner Violence:   . Fear of Current or Ex-Partner: Not on file  . Emotionally Abused: Not on file  . Physically Abused: Not on file  . Sexually Abused: Not on file     ROS- All systems are reviewed and negative except as per the HPI above.  Physical Exam: Vitals:   05/08/20 1341  BP: 124/88  Pulse: (!) 118  Weight: 76.1 kg  Height: _0  (1.6 m)    GEN- The patient is well appearing, alert and oriented x 3 today.   Head- normocephalic, atraumatic Eyes-  Sclera clear, conjunctiva pink Ears- hearing intact Oropharynx- clear Neck- supple  Lungs- Clear to ausculation bilaterally, normal work of breathing Heart- irregular rate and rhythm, tachycardia, no murmurs, rubs or gallops  GI- soft, NT, ND, + BS Extremities- no clubbing, cyanosis, or edema MS- no significant deformity or atrophy Skin- no rash or lesion Psych- euthymic mood, full affect Neuro- strength  and sensation are intact  Wt Readings from Last 3 Encounters:  05/08/20 76.1 kg  03/12/20 78.6 kg  03/02/20 76.1 kg    EKG today demonstrates afib HR 118, QRS 66, QTc 420  Echo 03/22/20 demonstrated  1. Left ventricular ejection fraction, by estimation, is 60 to 65%. Left  ventricular ejection fraction by PLAX is 62 %. The left ventricle has  normal function. The left ventricle has no regional wall motion  abnormalities. There is mild concentric left  ventricular hypertrophy. Left ventricular diastolic parameters are  consistent with Grade II diastolic dysfunction (pseudonormalization).  2. Right ventricular systolic function is normal. The right ventricular  size is normal.  3. Tricuspid valve regurgitation is mild to moderate.  4. The mitral valve is normal in structure. Mild mitral valve  regurgitation.  5. The aortic valve is calcified. Aortic valve regurgitation is not  visualized. Mild aortic valve sclerosis is present, with no evidence of  aortic valve stenosis.   Comparison(s): Compared to prior (LTS) Study, increase in TR.  Epic records are reviewed at length today  CHA2DS2-VASc Score = 3  The patient's score is based upon: CHF History: 0 HTN History: 1 Diabetes History: 1 Stroke History: 0 Vascular Disease History: 0 Age Score: 0 Gender Score: 1  {This patient does not have a recorded CHADS2-VASc score.   Click here to calculate score.   Then [facility] your note.       :[id number]}    ASSESSMENT AND PLAN: 1. Persistent Atrial Fibrillation (ICD10:  I48.19) The patient's CHA2DS2-VASc score is 3, indicating a 3.2% annual risk of stroke.   Suspect she is now persistent. All ECGs since 03/01/20 show afib. She is asymptomatic at this point but heart rate is elevated.  We discussed therapeutic options today including AAD (Multaq and flecainide) vs DCCV. Patient would like to discuss with her primary cardiologist Dr Debara Pickett. Will need to watch for bradycardia if Multaq  is chosen.  Will increase Toprol to 25 mg BID for rate control.  Continue Eliquis 5 mg BID Lifestyle modification was discussed including good blood glucose control. Last A1c was > 14. Maintaining SR will be difficult unless her DM is better controlled. Stressed importance  of compliance with medication and diet.   2. Secondary Hypercoagulable State (ICD10:  D68.69) The patient is at significant risk for stroke/thromboembolism based upon her CHA2DS2-VASc Score of 3.  Continue Apixaban (Eliquis).    Follow up with Dr Debara Pickett as scheduled.    [facility] [address] [place], [place] [address] [phone] 05/08/2020 2:03 PM

## 2020-05-09 ENCOUNTER — Other Ambulatory Visit: Payer: Self-pay | Admitting: Family Medicine

## 2020-05-09 ENCOUNTER — Other Ambulatory Visit: Payer: Self-pay | Admitting: Internal Medicine

## 2020-05-09 DIAGNOSIS — D500 Iron deficiency anemia secondary to blood loss (chronic): Secondary | ICD-10-CM

## 2020-05-09 MED FILL — ?BASAGLAR 100 UNITS/ML KWPE: 100 | 32 days supply | Qty: 9 | Fill #1

## 2020-05-09 MED FILL — ELIQUIS 5 MG TABLET: 5 | 30 days supply | Qty: 60 | Fill #1

## 2020-05-09 MED FILL — VICTOZA 18 MG/3 ML INJECT P: 18 | 30 days supply | Qty: 3 | Fill #1

## 2020-05-09 MED FILL — FERROUS SULFATE 325 MG TAB: 325 (65 FE) | 30 days supply | Qty: 60 | Fill #0

## 2020-05-09 MED FILL — METOPROLOL SUCCINATE ER 25: 25 | 30 days supply | Qty: 15 | Fill #3

## 2020-05-09 MED FILL — ?EZETIMIBE 10MG TABLET: 10MG | 30 days supply | Qty: 30 | Fill #1

## 2020-05-24 ENCOUNTER — Other Ambulatory Visit: Payer: Self-pay

## 2020-05-24 ENCOUNTER — Ambulatory Visit (INDEPENDENT_AMBULATORY_CARE_PROVIDER_SITE_OTHER): Payer: Self-pay | Admitting: Internal Medicine

## 2020-05-24 ENCOUNTER — Encounter: Payer: Self-pay | Admitting: Internal Medicine

## 2020-05-24 VITALS — BP 106/78 | HR 107 | Ht 63.0 in | Wt 173.0 lb

## 2020-05-24 DIAGNOSIS — I272 Pulmonary hypertension, unspecified: Secondary | ICD-10-CM

## 2020-05-24 DIAGNOSIS — R5383 Other fatigue: Secondary | ICD-10-CM

## 2020-05-24 DIAGNOSIS — Z9189 Other specified personal risk factors, not elsewhere classified: Secondary | ICD-10-CM

## 2020-05-24 DIAGNOSIS — R0683 Snoring: Secondary | ICD-10-CM

## 2020-05-24 DIAGNOSIS — I4819 Other persistent atrial fibrillation: Secondary | ICD-10-CM

## 2020-05-24 NOTE — Progress Notes (Signed)
OFFICE FOLLOW-UP NOTE  Chief Complaint:  Follow-up dyspnea  Primary Care Physician: Antony Blackbird, MD  HPI:  Emily Farmer is a 64 y.o. female with a past medial history significant for recent admission for atrial fibrillation with rapid ventricular response in the setting of acute pancreatitis. Triglycerides were markedly elevated in the thousands. She was initially placed on heparin and fenofibrate. Subsequently she was found to have an elevated LDL-C and was started on atorvastatin 10 mg daily. She was seen in follow-up by one of our nurse practitioners and noted that she was doing fairly well and that her cholesterol numbers had improved somewhat. This was several months ago. Today she tells me that she's been out of her medications and she last saw him. She says that she has no health insurance and although she works cannot afford her medications. She is only currently taking insulin and metformin, but should be on Eliquis for stroke prevention, atorvastatin, fenofibrate, metoprolol and pantoprazole. Most recently her triglycerides went up to 959, but were as low as 374 in April when she was on medication. Denies any recurrent palpitations.  06/01/2017  Emily Farmer was seen today in follow-up.  Unfortunately she has been noncompliant with medical therapy.  She had repeat lab work which shows essentially no change in her lipid profile.  Triglycerides remain in the 800s.  She is not taking her statin, fenofibrate or omega-3 fatty acids.  The main issue is cost for her.  She is also noncompliant with Eliquis.  She is interested in samples today.  She says that she had Medicaid through her son however he aged out of that and the Medicaid went with him.  She is apparently not been able to obtain Medicaid for herself.  I am not sure if she looked into an orange card.  05/11/2018  Emily Farmer is seen today in follow-up.  Fortunately she is managed to get Medicaid.  This is helped significantly for  medication compliance.  After taking medications her numbers have improved significantly.  Her total cholesterol is dropped from 3 43-1 87.  Triglycerides from 816-163.  LDL is down 105.  She denies any recurrent pancreatitis.  Unfortunately she has had some atrial fibrillation.  He has been on Eliquis more consistently and underwent cardioversion in the ER over the summer.  She saw how main, PA-C in follow-up.  He increased her beta-blocker.  She appears to be in sinus rhythm today and denies any recurrent A. Fib.  09/02/2018  Emily Farmer was seen today in follow-up.  She was recently hospitalized in January with anemia and paroxysmal atrial fibrillation.  Work-up for the anemia was negative and she has been started on iron.  She is on Eliquis with no obvious sources of ongoing bleeding.  Overall she feels well.  Her only other complaint today is some numbness and tingling in the first 3 fingers of both hands which are intermittent.  It seems a little worse in the morning improves throughout the day.  She does do cleaning work and some repetitive activities.  03/01/2020  Emily Farmer is seen today in follow-up.  I saw her last via virtual visit in 2020.  Unfortunately she continues to have metabolic issues.  Lipids in April 2021 showed total cholesterol 227, HDL 43, direct LDL of 96, and triglycerides of 446 (down from 774).  She denies any recurrent episodes of pancreatitis.  Unfortunately her hemoglobin A1c was 12.3 in July.  Her insulin has been adjusted further by her  endocrinologist.  When I last saw her she was on amiodarone for A. fib but had developed bradycardia in fact was in a sinus bradycardia.  Unfortunately the rate was slow and she was symptomatic therefore the amiodarone was discontinued.  Today she is now in A. fib with RVR.  More recently she was started on low-dose metoprolol XL 12.5 mg daily.  Heart rate 115 today with low normal blood pressure 111/76.  Her last echo in 2018 showed EF 60 to  65%.  She does have a history of some chronic diastolic heart failure.  More recently she has had shortness of breath particular walking up hills.  This could be related to RVR or perhaps some cardiomyopathy.  05/24/2020  Emily Farmer returns today for follow-up.  Recently she has been seen by Adline Peals, PA-C in the A. fib office for recurrent atrial fibrillation thought to be persistent.  He had increased her beta-blocker for better rate control.  Heart rate today is around 100.  Blood pressure is low at 106/78 therefore there is little room to increase it further.  Is not clear how symptomatic she is with regards to her A. fib.  She still has intermittent shortness of breath.  An echo October showed EF 60 to 65% with mild to moderate TR, mild MR and grade 2 diastolic dysfunction with elevated LV filling pressure and mild pulmonary hypertension.  PMHx:  Past Medical History:  Diagnosis Date  . Arthritis   . Atrial fibrillation ([place])    a. 09/2016 in setting of pancreatitis;  b. 09/2016 Echo: EF 65-60%, no rwma, Gr2 DD, mild MR, triv TR, PASP 58mHg;  CHA2DS2VASc = 4-->Eliquis 558mBID. c. Recurred 06/2018 in setting of GIB.  . Marland Kitchenhronic diastolic CHF (congestive heart failure) ([place]8/15/2019  . Diabetes mellitus   . GI bleeding 06/2018  . Gout   . Hyperlipidemia   . Hypertension   . Hypertriglyceridemia   . Pancreatitis    a. 09/2016 - Triglycerides 1,392 on admission.    Past Surgical History:  Procedure Laterality Date  . BIOPSY  06/20/2018   Procedure: BIOPSY;  Surgeon: MaClarene EssexMD;  [facility];  Service: Endoscopy;;  . CHOLECYSTECTOMY N/A 01/04/2014   Procedure: LAPAROSCOPIC CHOLECYSTECTOMY WITH INTRAOPERATIVE CHOLANGIOGRAM;  Surgeon: ArRalene OkMD;  [facility]: General;  Laterality: N/A;  . COLONOSCOPY WITH PROPOFOL N/A 06/21/2018   Procedure: COLONOSCOPY WITH PROPOFOL;  Surgeon: BuRonald LoboMD;  Location: [facility] Service: Endoscopy;   Laterality: N/A;  . ESOPHAGOGASTRODUODENOSCOPY (EGD) WITH PROPOFOL N/A 06/20/2018   Procedure: ESOPHAGOGASTRODUODENOSCOPY (EGD) WITH PROPOFOL;  Surgeon: MaClarene EssexMD;  [facility]: Endoscopy;  Laterality: N/A;  . LUNG SURGERY      FAMHx:  Family History  Problem Relation Age of Onset  . Heart attack Mother   . Heart attack Father   . Diabetes Sister   . High blood pressure Neg Hx   . High Cholesterol Neg Hx     SOCHx:   reports that she has never smoked. She has never used smokeless tobacco. She reports that she does not drink alcohol and does not use drugs.  ALLERGIES:  No Known Allergies  ROS: Pertinent items noted in HPI and remainder of comprehensive ROS otherwise negative.  HOME MEDS: Current Outpatient Medications on File Prior to Visit  Medication Sig Dispense Refill  . acetaminophen (TYLENOL) 325 MG tablet Take 2 tablets (650 mg total) by mouth every 6 (six) hours as  needed for mild pain (or Fever >/= 101).    Marland Kitchen apixaban (ELIQUIS) 5 MG TABS tablet Take 1 tablet (5 mg total) by mouth 2 (two) times daily. 60 tablet 1  . atorvastatin (LIPITOR) 40 MG tablet Take 1 tablet (40 mg total) by mouth daily. 90 tablet 3  . Blood Glucose Monitoring Suppl (TRUE METRIX METER) w/Device KIT Use to monitor blood sugar as directed 1 kit 0  . ezetimibe (ZETIA) 10 MG tablet Take 1 tablet (10 mg total) by mouth daily. To help lower lipids 90 tablet 3  . FEROSUL 325 (65 Fe) MG tablet TAKE 1 TABLET (325 MG TOTAL) BY MOUTH 2 (TWO) TIMES DAILY WITH A MEAL. 60 tablet 3  . glucose blood test strip USE AS INSTRUCTED TO CHECK BLOOD SUGARS 3 TIMES DAILY 100 each 11  . insulin glargine (LANTUS) 100 UNIT/ML injection Inject 24 Units into the skin at bedtime.    . insulin lispro (HUMALOG KWIKPEN) 100 UNIT/ML KwikPen Inject 0.1 mLs (10 Units total) into the skin 3 (three) times daily. 30 mL 11  . Insulin Pen Needle (TRUEPLUS PEN NEEDLES) 32G X 4 MM MISC Use with insulin pen to inject  Lantus daily. 100 each 11  . Insulin Syringe-Needle U-100 (TRUEPLUS INSULIN SYRINGE) 31G X 5/16" 0.3 ML MISC Use to inject insulin daily. 100 each 11  . liraglutide (VICTOZA) 18 MG/3ML SOPN Inject 0.6 mg into the skin daily. 3 mL 6  . metFORMIN (GLUCOPHAGE) 500 MG tablet Take 0.5 tablets (250 mg total) by mouth 4 (four) times daily. 60 tablet 2  . metoprolol succinate (TOPROL-XL) 25 MG 24 hr tablet Take 1 tablet (25 mg total) by mouth in the morning and at bedtime. 180 tablet 3  . Multiple Vitamins-Minerals (CENTRUM SILVER PO) Take 1 tablet by mouth daily.    . mupirocin ointment (BACTROBAN) 2 % Place 1 application into the nose 2 (two) times daily. 30 g 0  . TRUEplus Lancets 28G MISC USE TO CHECK BLOOD SUGAR 3 TIMES DAILY 100 each 11   No current facility-administered medications on file prior to visit.    LABS/IMAGING: No results found for this or any previous visit (from the past 48 hour(s)). No results found.  LIPID PANEL:    Component Value Date/Time   CHOL 268 (H) 03/01/2020 1012   TRIG 494 (H) 03/01/2020 1012   HDL 51 03/01/2020 1012   CHOLHDL 5.3 (H) 03/01/2020 1012   CHOLHDL 5 09/23/2019 0828   VLDL NOT CALC 11/21/2016 0822   LDLCALC 128 (H) 03/01/2020 1012   LDLDIRECT 137 (H) 03/01/2020 1012   LDLDIRECT 96.0 09/23/2019 0828     WEIGHTS: Wt Readings from Last 3 Encounters:  05/24/20 173 lb (78.5 kg)  05/08/20 167 lb 12.8 oz (76.1 kg)  03/12/20 173 lb 3.2 oz (78.6 kg)    VITALS: BP 106/78   Pulse (!) 107   Ht '5\' 3"'  (1.6 m)   Wt 173 lb (78.5 kg)   BMI 30.65 kg/m   EXAM: General appearance: alert and no distress Neck: no carotid bruit, no JVD and thyroid not enlarged, symmetric, no tenderness/mass/nodules Lungs: clear to auscultation bilaterally Heart: irregularly irregular rhythm Abdomen: soft, non-tender; bowel sounds normal; no masses,  no organomegaly Extremities: extremities normal, atraumatic, no cyanosis or edema Pulses: 2+ and symmetric Skin: Skin  color, texture, turgor normal. No rashes or lesions Neurologic: Grossly normal Psych: Pleasnat  EKG: A. fib with RVR at 107-personally reviewed  ASSESSMENT: 1. Progressive dyspnea on exertion -  LVEF 60-65%, grade2 DD, RVSP 46 mmHg 2. Hyperchylomicronemia 3. Pancreatitis secondary to problem #1 4. Persistent atrial fibrillation-CHADSVASC score 4 5. Type 2 diabetes on insulin -poorly controlled with A1c of 12 6. Essential hypertension 7. Possible carpal tunnel syndrome 8. Iron deficient anemia 9. Snoring, nonrestorative sleep, fatigue and daytime somnolence -possible OSA  PLAN: 1.   Mrs. Rabanal still has some persistent dyspnea.  This could be related to diastolic dysfunction however I doubt is related to her atrial fibrillation.  This is reasonably well controlled at this point.  Her blood pressure would not allow titration of her medicines.  It is possible she might respond to a diuretic with her elevated filling pressures however I am not sure her blood pressure will allow that.  He also notes snoring, fatigue, nonrestorative sleep and with her atrial fibrillation, is at increased risk for sleep apnea.  I like to obtain a home sleep study to rule out OSA.  Follow-up with me otherwise in 6 months or sooner as necessary.  Pixie Casino, MD, [facility], [place] Director of the Advanced Lipid Disorders &  Cardiovascular Risk Reduction Clinic Attending Cardiologist  Direct Dial: [phone]  Fax: [phone]  Website:  [email or web address] 05/24/2020, 11:16 AM

## 2020-05-24 NOTE — Patient Instructions (Signed)
Medication Instructions:  Your physician recommends that you continue on your current medications as directed. Please refer to the Current Medication list given to you today.  *If you need a refill on your cardiac medications before your next appointment, please call your pharmacy*  Testing/Procedures: HOME SLEEP TEST -- once approved with insurance, you will be called to schedule   Follow-Up: At [facility], you and your health needs are our priority.  As part of our continuing mission to provide you with exceptional heart care, we have created designated Provider Care Teams.  These Care Teams include your primary Cardiologist (physician) and Advanced Practice Providers (APPs -  Physician Assistants and Nurse Practitioners) who all work together to provide you with the care you need, when you need it.  We recommend signing up for the patient portal called "MyChart".  Sign up information is provided on this After Visit Summary.  MyChart is used to connect with patients for Virtual Visits (Telemedicine).  Patients are able to view lab/test results, encounter notes, upcoming appointments, etc.  Non-urgent messages can be sent to your provider as well.   To learn more about what you can do with MyChart, go to [email or web address].    Your next appointment:   6 month(s)  The format for your next appointment:   In Person  Provider:   You may see Chrystie Nose, MD or one of the following Advanced Practice Providers on your designated Care Team:    Azalee Course, PA-C  Micah Flesher, PA-C or   Judy Pimple, [place]    Other Instructions

## 2020-05-29 ENCOUNTER — Other Ambulatory Visit: Payer: Medicaid Other

## 2020-06-01 ENCOUNTER — Telehealth: Payer: Self-pay | Admitting: Internal Medicine

## 2020-06-01 NOTE — Telephone Encounter (Signed)
No pre auth required.  Waiting on sleep lab to call back to schedule

## 2020-06-04 NOTE — Telephone Encounter (Signed)
Called patient and left message that she is scheduled for January 17 at 1:30 pm, about the info packet and left the sleep lab number for her.

## 2020-06-05 ENCOUNTER — Ambulatory Visit: Payer: Self-pay | Attending: Family Medicine

## 2020-06-05 ENCOUNTER — Other Ambulatory Visit: Payer: Self-pay

## 2020-06-05 DIAGNOSIS — E785 Hyperlipidemia, unspecified: Secondary | ICD-10-CM

## 2020-06-06 ENCOUNTER — Ambulatory Visit: Payer: Medicaid Other | Admitting: Internal Medicine

## 2020-06-06 LAB — LIPID PANEL
Chol/HDL Ratio: 5.4 ratio — ABNORMAL HIGH (ref 0.0–4.4)
Cholesterol, Total: 214 mg/dL — ABNORMAL HIGH (ref 100–199)
HDL: 40 mg/dL
LDL Chol Calc (NIH): 110 mg/dL — ABNORMAL HIGH (ref 0–99)
Triglycerides: 374 mg/dL — ABNORMAL HIGH (ref 0–149)
VLDL Cholesterol Cal: 64 mg/dL — ABNORMAL HIGH (ref 5–40)

## 2020-07-02 ENCOUNTER — Encounter (HOSPITAL_BASED_OUTPATIENT_CLINIC_OR_DEPARTMENT_OTHER): Payer: Medicaid Other | Admitting: Cardiovascular Disease

## 2020-07-11 LAB — HM DIABETES EYE EXAM

## 2020-07-12 ENCOUNTER — Encounter (INDEPENDENT_AMBULATORY_CARE_PROVIDER_SITE_OTHER): Payer: Self-pay | Admitting: Ophthalmology

## 2020-07-12 ENCOUNTER — Ambulatory Visit: Payer: Medicaid Other | Admitting: Internal Medicine

## 2020-07-19 ENCOUNTER — Encounter (INDEPENDENT_AMBULATORY_CARE_PROVIDER_SITE_OTHER): Payer: Self-pay | Admitting: Ophthalmology

## 2020-07-24 ENCOUNTER — Encounter (HOSPITAL_BASED_OUTPATIENT_CLINIC_OR_DEPARTMENT_OTHER): Payer: Medicaid Other | Admitting: Cardiovascular Disease

## 2020-07-27 ENCOUNTER — Encounter (INDEPENDENT_AMBULATORY_CARE_PROVIDER_SITE_OTHER): Payer: Self-pay | Admitting: Ophthalmology

## 2020-07-30 NOTE — Progress Notes (Shared)
[facility] Note  08/07/2020     CHIEF COMPLAINT Patient presents for No chief complaint on file.   HISTORY OF PRESENT ILLNESS: Emily Farmer is a 65 y.o. female who presents to the clinic today for:     Referring physician: Warden Fillers, MD [address] [place],  [place] [id number]  HISTORICAL INFORMATION:   Selected notes from the MEDICAL RECORD NUMBER Referred by Dr. Shirleen Schirmer for eval of DR OU   CURRENT MEDICATIONS: No current outpatient medications on file. (Ophthalmic Drugs)   No current facility-administered medications for this visit. (Ophthalmic Drugs)   Current Outpatient Medications (Other)  Medication Sig  . acetaminophen (TYLENOL) 325 MG tablet Take 2 tablets (650 mg total) by mouth every 6 (six) hours as needed for mild pain (or Fever >/= 101).  Marland Kitchen apixaban (ELIQUIS) 5 MG TABS tablet Take 1 tablet (5 mg total) by mouth 2 (two) times daily.  Marland Kitchen atorvastatin (LIPITOR) 40 MG tablet Take 1 tablet (40 mg total) by mouth daily.  . Blood Glucose Monitoring Suppl (TRUE METRIX METER) w/Device KIT Use to monitor blood sugar as directed  . ezetimibe (ZETIA) 10 MG tablet Take 1 tablet (10 mg total) by mouth daily. To help lower lipids  . FEROSUL 325 (65 Fe) MG tablet TAKE 1 TABLET (325 MG TOTAL) BY MOUTH 2 (TWO) TIMES DAILY WITH A MEAL.  Marland Kitchen glucose blood test strip USE AS INSTRUCTED TO CHECK BLOOD SUGARS 3 TIMES DAILY  . insulin glargine (LANTUS) 100 UNIT/ML injection Inject 24 Units into the skin at bedtime.  . insulin lispro (HUMALOG KWIKPEN) 100 UNIT/ML KwikPen Inject 0.1 mLs (10 Units total) into the skin 3 (three) times daily.  . Insulin Pen Needle (TRUEPLUS PEN NEEDLES) 32G X 4 MM MISC Use with insulin pen to inject Lantus daily.  . Insulin Syringe-Needle U-100 (TRUEPLUS INSULIN SYRINGE) 31G X 5/16" 0.3 ML MISC Use to inject insulin daily.  Marland Kitchen liraglutide (VICTOZA) 18 MG/3ML SOPN Inject 0.6 mg into the skin daily.  . metFORMIN  (GLUCOPHAGE) 500 MG tablet Take 0.5 tablets (250 mg total) by mouth 4 (four) times daily.  . metoprolol succinate (TOPROL-XL) 25 MG 24 hr tablet Take 1 tablet (25 mg total) by mouth in the morning and at bedtime.  . Multiple Vitamins-Minerals (CENTRUM SILVER PO) Take 1 tablet by mouth daily.  . mupirocin ointment (BACTROBAN) 2 % Place 1 application into the nose 2 (two) times daily.  . TRUEplus Lancets 28G MISC USE TO CHECK BLOOD SUGAR 3 TIMES DAILY   No current facility-administered medications for this visit. (Other)      REVIEW OF SYSTEMS:    ALLERGIES No Known Allergies  PAST MEDICAL HISTORY Past Medical History:  Diagnosis Date  . Arthritis   . Atrial fibrillation ([place])    a. 09/2016 in setting of pancreatitis;  b. 09/2016 Echo: EF 65-60%, no rwma, Gr2 DD, mild MR, triv TR, PASP 65mmHg;  CHA2DS2VASc = 4-->Eliquis $RemoveBefor'5mg'taABUhAPZTxj$  BID. c. Recurred 06/2018 in setting of GIB.  Marland Kitchen Chronic diastolic CHF (congestive heart failure) ([place]) 01/28/2018  . Diabetes mellitus   . GI bleeding 06/2018  . Gout   . Hyperlipidemia   . Hypertension   . Hypertriglyceridemia   . Pancreatitis    a. 09/2016 - Triglycerides 1,392 on admission.   Past Surgical History:  Procedure Laterality Date  . BIOPSY  06/20/2018   Procedure: BIOPSY;  Surgeon: Clarene Essex, MD;  Location: [place];  Service: Endoscopy;;  .  CHOLECYSTECTOMY N/A 01/04/2014   Procedure: LAPAROSCOPIC CHOLECYSTECTOMY WITH INTRAOPERATIVE CHOLANGIOGRAM;  Surgeon: Ralene Ok, MD;  Location: [place];  Service: General;  Laterality: N/A;  . COLONOSCOPY WITH PROPOFOL N/A 06/21/2018   Procedure: COLONOSCOPY WITH PROPOFOL;  Surgeon: Ronald Lobo, MD;  Location: [place];  Service: Endoscopy;  Laterality: N/A;  . ESOPHAGOGASTRODUODENOSCOPY (EGD) WITH PROPOFOL N/A 06/20/2018   Procedure: ESOPHAGOGASTRODUODENOSCOPY (EGD) WITH PROPOFOL;  Surgeon: Clarene Essex, MD;  Location: [facility];  Service: Endoscopy;  Laterality: N/A;  . LUNG SURGERY       FAMILY HISTORY Family History  Problem Relation Age of Onset  . Heart attack Mother   . Heart attack Father   . Diabetes Sister   . High blood pressure Neg Hx   . High Cholesterol Neg Hx     SOCIAL HISTORY Social History   Tobacco Use  . Smoking status: Never Smoker  . Smokeless tobacco: Never Used  Vaping Use  . Vaping Use: Never used  Substance Use Topics  . Alcohol use: No  . Drug use: No         OPHTHALMIC EXAM: Not recorded     IMAGING AND PROCEDURES  Imaging and Procedures for 08/07/2020           ASSESSMENT/PLAN:  No diagnosis found.  1.  2.  3.  Ophthalmic Meds Ordered this visit:  No orders of the defined types were placed in this encounter.      No follow-ups on file.  There are no Patient Instructions on file for this visit.   Explained the diagnoses, plan, and follow up with the patient and they expressed understanding.  Patient expressed understanding of the importance of proper follow up care.   This document serves as a record of services personally performed by Gardiner Sleeper, MD, PhD. It was created on their behalf by Estill Bakes, COT an ophthalmic technician. The creation of this record is the provider's dictation and/or activities during the visit.    Electronically signed by: Estill Bakes, COT 2.14.22 @ 3:36 PM  Gardiner Sleeper, M.D., Ph.D. Diseases & Surgery of the Retina and Vitreous [facility]: M myopia (nearsighted); A astigmatism; H hyperopia (farsighted); P presbyopia; Mrx spectacle prescription;  CTL contact lenses; OD right eye; OS left eye; OU both eyes  XT exotropia; ET esotropia; PEK punctate epithelial keratitis; PEE punctate epithelial erosions; DES dry eye syndrome; MGD meibomian gland dysfunction; ATs artificial tears; PFAT's preservative free artificial tears; [place] nuclear sclerotic cataract; PSC posterior subcapsular cataract; ERM epi-retinal membrane;  PVD posterior vitreous detachment; RD retinal detachment; DM diabetes mellitus; DR diabetic retinopathy; NPDR non-proliferative diabetic retinopathy; PDR proliferative diabetic retinopathy; CSME clinically significant macular edema; DME diabetic macular edema; dbh dot blot hemorrhages; CWS cotton wool spot; POAG primary open angle glaucoma; C/D cup-to-disc ratio; HVF humphrey visual field; GVF goldmann visual field; OCT optical coherence tomography; IOP intraocular pressure; BRVO Branch retinal vein occlusion; CRVO central retinal vein occlusion; CRAO central retinal artery occlusion; BRAO branch retinal artery occlusion; RT retinal tear; SB scleral buckle; PPV pars plana vitrectomy; VH Vitreous hemorrhage; PRP panretinal laser photocoagulation; IVK intravitreal kenalog; VMT vitreomacular traction; MH Macular hole;  NVD neovascularization of the disc; NVE neovascularization elsewhere; AREDS age related eye disease study; ARMD age related macular degeneration; POAG primary open angle glaucoma; EBMD epithelial/anterior basement membrane dystrophy; ACIOL anterior chamber intraocular lens; IOL intraocular lens; PCIOL posterior chamber intraocular lens; Phaco/IOL phacoemulsification with intraocular lens  placement; [place] photorefractive keratectomy; LASIK laser assisted in situ keratomileusis; HTN hypertension; DM diabetes mellitus; COPD chronic obstructive pulmonary disease

## 2020-08-07 ENCOUNTER — Encounter (INDEPENDENT_AMBULATORY_CARE_PROVIDER_SITE_OTHER): Payer: Self-pay

## 2020-08-07 ENCOUNTER — Encounter (INDEPENDENT_AMBULATORY_CARE_PROVIDER_SITE_OTHER): Payer: Medicaid Other | Admitting: Ophthalmology

## 2020-08-07 ENCOUNTER — Other Ambulatory Visit: Payer: Self-pay

## 2020-08-07 DIAGNOSIS — H3581 Retinal edema: Secondary | ICD-10-CM

## 2020-08-15 ENCOUNTER — Other Ambulatory Visit: Payer: Self-pay

## 2020-08-15 ENCOUNTER — Ambulatory Visit (HOSPITAL_BASED_OUTPATIENT_CLINIC_OR_DEPARTMENT_OTHER): Payer: Self-pay | Attending: Internal Medicine | Admitting: Cardiovascular Disease

## 2020-08-15 DIAGNOSIS — R0683 Snoring: Secondary | ICD-10-CM

## 2020-08-15 DIAGNOSIS — R5383 Other fatigue: Secondary | ICD-10-CM

## 2020-08-15 DIAGNOSIS — G4733 Obstructive sleep apnea (adult) (pediatric): Secondary | ICD-10-CM

## 2020-08-15 DIAGNOSIS — G4736 Sleep related hypoventilation in conditions classified elsewhere: Secondary | ICD-10-CM | POA: Insufficient documentation

## 2020-08-15 DIAGNOSIS — I272 Pulmonary hypertension, unspecified: Secondary | ICD-10-CM | POA: Insufficient documentation

## 2020-08-15 DIAGNOSIS — I4819 Other persistent atrial fibrillation: Secondary | ICD-10-CM

## 2020-08-15 DIAGNOSIS — Z7901 Long term (current) use of anticoagulants: Secondary | ICD-10-CM | POA: Insufficient documentation

## 2020-08-15 DIAGNOSIS — Z79899 Other long term (current) drug therapy: Secondary | ICD-10-CM | POA: Insufficient documentation

## 2020-08-15 DIAGNOSIS — Z794 Long term (current) use of insulin: Secondary | ICD-10-CM | POA: Insufficient documentation

## 2020-08-15 DIAGNOSIS — Z7984 Long term (current) use of oral hypoglycemic drugs: Secondary | ICD-10-CM | POA: Insufficient documentation

## 2020-08-26 ENCOUNTER — Encounter (HOSPITAL_BASED_OUTPATIENT_CLINIC_OR_DEPARTMENT_OTHER): Payer: Self-pay | Admitting: Cardiovascular Disease

## 2020-08-26 NOTE — Procedures (Signed)
    Patient Name: Emily Farmer, Emily Farmer Date: 08/15/2020 Gender: Female D.O.B: 05-Feb-1956 Age (years): 64 Referring Provider: Nadean Corwin Hilty Height (inches): 63 Interpreting Physician: Shelva Majestic MD, ABSM Weight (lbs): 172 RPSGT: Jacolyn Reedy BMI: 30 MRN: [id number] Neck Size: 14.00  CLINICAL INFORMATION Sleep Study Type: HST  Indication for sleep study: snoring, fatigue, atrial fibrillation,   Epworth Sleepiness Score: 5  SLEEP STUDY TECHNIQUE A multi-channel overnight portable sleep study was performed. The channels recorded were: nasal airflow, thoracic respiratory movement, and oxygen saturation with a pulse oximetry. Snoring was also monitored.  MEDICATIONS acetaminophen (TYLENOL) 325 MG tablet apixaban (ELIQUIS) 5 MG TABS tablet atorvastatin (LIPITOR) 40 MG tablet Blood Glucose Monitoring Suppl (TRUE METRIX METER) w/Device KIT ezetimibe (ZETIA) 10 MG tablet FEROSUL 325 (65 Fe) MG tablet glucose blood test strip insulin glargine (LANTUS) 100 UNIT/ML injection insulin lispro (HUMALOG KWIKPEN) 100 UNIT/ML KwikPen Insulin Pen Needle (TRUEPLUS PEN NEEDLES) 32G X 4 MM MISC Insulin Syringe-Needle U-100 (TRUEPLUS INSULIN SYRINGE) 31G X 5/16" 0.3 ML MISC liraglutide (VICTOZA) 18 MG/3ML SOPN metFORMIN (GLUCOPHAGE) 500 MG tablet metoprolol succinate (TOPROL-XL) 25 MG 24 hr tablet Multiple Vitamins-Minerals (CENTRUM SILVER PO) mupirocin ointment (BACTROBAN) 2 % TRUEplus Lancets 28G MISC Patient self administered medications include: N/A.  SLEEP ARCHITECTURE Patient was studied for 363.5 minutes. The sleep efficiency was 100.0 % and the patient was supine for 63.5%. The arousal index was 0.0 per hour.  RESPIRATORY PARAMETERS The overall AHI was 22.3 per hour, with a central apnea index of 0 per hour. Events were severe during  supine sleep (AHI 33.0/h).  The oxygen nadir was 62% during sleep.   CARDIAC DATA Mean heart rate during sleep was 102.3  bpm.  IMPRESSIONS - Moderate obstructive sleep apnea occurred during this study (AHI 22.3/h); however, events were severe with supine sleep (AHI 33.0/h). The severity of sleep apnea cannot be assessed on this home study. - Severe oxygen desaturation to a nadir of 62%. - Patient snored 8.3% during the sleep.  DIAGNOSIS - Obstructive Sleep Apnea (G47.33) - Nocturnal Hypoxemia (G47.36)  RECOMMENDATIONS - In this patient with cardiovascular comorbidities including atrial fibrillation, grade 2 diastolic heart failure, pulmonary hypertension and severe nocturna oxygen desaturation recommend an in-lab CPAP titration study to further assess and treat her sleep apnea particularly during REM sleep. - Effort should be made to optimize nasal and oropharyngeal patency. - Positional therapy avoiding supine position during sleep. - Avoid alcohol, sedatives and other CNS depressants that may worsen sleep apnea and disrupt normal sleep architecture. - Sleep hygiene should be reviewed to assess factors that may improve sleep quality. - Weight management and regular exercise should be initiated or continued. - Recommend a download and sleep clinic evaluation after initiation of therapy.    [Electronically signed] 08/26/2020 02:34 PM  Shelva Majestic MD, [facility], [place], American Board of Sleep Medicine   NPI: [id number] [place] PH: [phone]   FX: [phone] [place]

## 2020-08-27 ENCOUNTER — Other Ambulatory Visit: Payer: Self-pay | Admitting: Cardiovascular Disease

## 2020-08-27 ENCOUNTER — Telehealth: Payer: Self-pay | Admitting: *Deleted

## 2020-08-27 DIAGNOSIS — G4733 Obstructive sleep apnea (adult) (pediatric): Secondary | ICD-10-CM

## 2020-08-27 NOTE — Telephone Encounter (Signed)
Patient notified of HST results and recommendations. She agrees to proceed with CPAP titration study scheduled for 10/17/20.

## 2020-09-15 ENCOUNTER — Other Ambulatory Visit: Payer: Self-pay

## 2020-10-02 ENCOUNTER — Other Ambulatory Visit: Payer: Self-pay

## 2020-10-02 MED FILL — Metoprolol Succinate Tab ER 24HR 25 MG (Tartrate Equiv): ORAL | 30 days supply | Qty: 60 | Fill #0 | Status: AC

## 2020-10-02 MED FILL — Insulin Pen Needle 32 G X 4 MM (1/6" or 5/32"): 30 days supply | Qty: 100 | Fill #0 | Status: AC

## 2020-10-02 MED FILL — Insulin Glargine Soln Pen-Injector 100 Unit/ML: SUBCUTANEOUS | 32 days supply | Qty: 9 | Fill #0 | Status: AC

## 2020-10-02 MED FILL — Liraglutide Soln Pen-injector 18 MG/3ML (6 MG/ML): SUBCUTANEOUS | 30 days supply | Qty: 6 | Fill #0 | Status: AC

## 2020-10-03 ENCOUNTER — Other Ambulatory Visit: Payer: Self-pay

## 2020-10-08 ENCOUNTER — Ambulatory Visit: Payer: Medicaid Other | Admitting: Internal Medicine

## 2020-10-17 ENCOUNTER — Encounter (HOSPITAL_BASED_OUTPATIENT_CLINIC_OR_DEPARTMENT_OTHER): Payer: Medicaid Other | Admitting: Cardiovascular Disease

## 2020-10-20 ENCOUNTER — Emergency Department (HOSPITAL_COMMUNITY)
Admission: EM | Admit: 2020-10-20 | Discharge: 2020-10-20 | Disposition: A | Discharge status: Home / Self Care | Payer: Medicaid Other | Attending: Emergency Medicine | Admitting: Emergency Medicine

## 2020-10-20 ENCOUNTER — Emergency Department (HOSPITAL_COMMUNITY): Payer: Medicaid Other

## 2020-10-20 ENCOUNTER — Encounter (HOSPITAL_COMMUNITY): Payer: Self-pay

## 2020-10-20 ENCOUNTER — Other Ambulatory Visit: Payer: Self-pay

## 2020-10-20 DIAGNOSIS — I110 Hypertensive heart disease with heart failure: Secondary | ICD-10-CM | POA: Insufficient documentation

## 2020-10-20 DIAGNOSIS — E119 Type 2 diabetes mellitus without complications: Secondary | ICD-10-CM | POA: Insufficient documentation

## 2020-10-20 DIAGNOSIS — I5032 Chronic diastolic (congestive) heart failure: Secondary | ICD-10-CM | POA: Insufficient documentation

## 2020-10-20 DIAGNOSIS — Z7984 Long term (current) use of oral hypoglycemic drugs: Secondary | ICD-10-CM | POA: Insufficient documentation

## 2020-10-20 DIAGNOSIS — Z794 Long term (current) use of insulin: Secondary | ICD-10-CM | POA: Insufficient documentation

## 2020-10-20 DIAGNOSIS — I4811 Longstanding persistent atrial fibrillation: Secondary | ICD-10-CM

## 2020-10-20 LAB — BASIC METABOLIC PANEL
Anion gap: 12 (ref 5–15)
BUN: 24 mg/dL — ABNORMAL HIGH (ref 8–23)
CO2: 24 mmol/L (ref 22–32)
Calcium: 9.1 mg/dL (ref 8.9–10.3)
Chloride: 96 mmol/L — ABNORMAL LOW (ref 98–111)
Creatinine, Ser: 1.07 mg/dL — ABNORMAL HIGH (ref 0.44–1.00)
GFR, Estimated: 58 mL/min — ABNORMAL LOW (ref >60)
Glucose, Bld: 311 mg/dL — ABNORMAL HIGH (ref 70–99)
Potassium: 4.9 mmol/L (ref 3.5–5.1)
Sodium: 132 mmol/L — ABNORMAL LOW (ref 135–145)

## 2020-10-20 LAB — CBC WITH DIFFERENTIAL/PLATELET
Abs Immature Granulocytes: 0.02 10*3/uL (ref 0.00–0.07)
Basophils Absolute: 0.1 10*3/uL (ref 0.0–0.1)
Basophils Relative: 1 %
Eosinophils Absolute: 0.3 10*3/uL (ref 0.0–0.5)
Eosinophils Relative: 3 %
HCT: 40.1 % (ref 36.0–46.0)
Hemoglobin: 13.6 g/dL (ref 12.0–15.0)
Immature Granulocytes: 0 %
Lymphocytes Relative: 18 %
Lymphs Abs: 1.5 10*3/uL (ref 0.7–4.0)
MCH: 29.4 pg (ref 26.0–34.0)
MCHC: 33.9 g/dL (ref 30.0–36.0)
MCV: 86.8 fL (ref 80.0–100.0)
Monocytes Absolute: 0.7 10*3/uL (ref 0.1–1.0)
Monocytes Relative: 8 %
Neutro Abs: 6 10*3/uL (ref 1.7–7.7)
Neutrophils Relative %: 70 %
Platelets: 216 10*3/uL (ref 150–400)
RBC: 4.62 MIL/uL (ref 3.87–5.11)
RDW: 13 % (ref 11.5–15.5)
WBC: 8.5 10*3/uL (ref 4.0–10.5)
nRBC: 0 % (ref 0.0–0.2)

## 2020-10-20 LAB — MAGNESIUM: Magnesium: 1.2 mg/dL — ABNORMAL LOW (ref 1.7–2.4)

## 2020-10-20 LAB — TROPONIN I (HIGH SENSITIVITY)
Troponin I (High Sensitivity): 8 ng/L (ref <18)
Troponin I (High Sensitivity): 9 ng/L (ref <18)

## 2020-10-20 MED ORDER — METOPROLOL SUCCINATE ER 25 MG PO TB24
25.0000 mg | ORAL_TABLET | Freq: Every day | ORAL | Status: DC
  Administered 2020-10-20: 25 mg via ORAL
  Filled 2020-10-20 (×2): qty 1

## 2020-10-20 MED ORDER — METOPROLOL SUCCINATE ER 25 MG PO TB24
25.0000 mg | ORAL_TABLET | Freq: Every day | ORAL | 0 refills | Status: DC
  Filled 2020-10-20: qty 30, 30d supply, fill #0

## 2020-10-20 MED ORDER — SODIUM CHLORIDE 0.9 % IV BOLUS
500.0000 mL | Freq: Once | INTRAVENOUS | Status: AC
  Administered 2020-10-20: 500 mL via INTRAVENOUS

## 2020-10-20 MED ORDER — METOPROLOL TARTRATE 5 MG/5ML IV SOLN
5.0000 mg | Freq: Once | INTRAVENOUS | Status: AC
  Administered 2020-10-20: 5 mg via INTRAVENOUS
  Filled 2020-10-20 (×2): qty 5

## 2020-10-20 MED ORDER — MAGNESIUM SULFATE 2 GM/50ML IV SOLN
2.0000 g | Freq: Once | INTRAVENOUS | Status: AC
  Administered 2020-10-20: 2 g via INTRAVENOUS
  Filled 2020-10-20: qty 50

## 2020-10-20 MED ORDER — APIXABAN 5 MG PO TABS
5.0000 mg | ORAL_TABLET | Freq: Two times a day (BID) | ORAL | 0 refills | Status: DC
Start: 2020-10-20 — End: 2021-04-04
  Filled 2020-10-20: qty 60, 30d supply, fill #0

## 2020-10-20 NOTE — ED Provider Notes (Signed)
[facility] EMERGENCY DEPARTMENT Provider Note   CSN: [id number] Arrival date & time: 10/20/20  0751     History Chief Complaint  Patient presents with  . Shortness of Breath  . Irregular Heart Beat    Emily Farmer is a 65 y.o. female with past medical history of persistent atrial fibrillation CHA2DS2-VASc score 4 followed by Dr. Debara Pickett at [facility], poorly controlled IDT2DM, HTN, and pancreatitis who presents to the ED via EMS for lightheadedness and shortness of breath.  History is obtained by EMS reports that she was initially in atrial fibrillation with RVR with HR 120-160s.  She was given 10 mg diltiazem with improvement.  On my examination, patient is feeling much improved.  She states that every day she walks approximately 2 miles from her home to her place of employment.  She works as an Environmental consultant at [employer].  She states that this morning on her walker she felt as though she was going to pass out and called 911.  She knew it was an episode of A. fib with RVR.  She states that she felt mildly fatigued/weak this morning, but attributed to her recently diagnosed OSA.  She is scheduled for a sleep study early next week.  She reports that yesterday she had a mild nonproductive cough, but attributed to the cold weather.  During her lightheadedness, she felt as though she was going to pass out, but denied any associated chest pain.  Instead, she simply felt short of breath.  Denies any room spinning dizziness.  She also denies any recent fevers or chills, hemoptysis, leg swelling, history of clots/DVT, abdominal pain, nausea vomiting, numbness or weakness, blurred vision or diplopia, headache, or any other symptoms.  When she had the episode of pancreatitis, she states that she was having severe abdominal pain.  She denies any at present.  She states that it was suspected to be due to excess greasy foods?  She confirms that she is not on Lipitor for her  hypercholesterolemia or Xarelto for her atrial fibrillation.  This is due to financial constraints.    HPI     Past Medical History:  Diagnosis Date  . Arthritis   . Atrial fibrillation ([place])    a. 09/2016 in setting of pancreatitis;  b. 09/2016 Echo: EF 65-60%, no rwma, Gr2 DD, mild MR, triv TR, PASP 44mHg;  CHA2DS2VASc = 4-->Eliquis 586mBID. c. Recurred 06/2018 in setting of GIB.  . Marland Kitchenhronic diastolic CHF (congestive heart failure) (HCJacksons' Gap8/15/2019  . Diabetes mellitus   . GI bleeding 06/2018  . Gout   . Hyperlipidemia   . Hypertension   . Hypertriglyceridemia   . Pancreatitis    a. 09/2016 - Triglycerides 1,392 on admission.    Patient Active Problem List   Diagnosis Date Noted  . Secondary hypercoagulable state (HCCalvin11/23/2021  . Type 2 diabetes mellitus with diabetic polyneuropathy, with long-term current use of insulin (HCWrightstown04/02/2020  . Type 2 diabetes mellitus with hyperglycemia, with long-term current use of insulin (HCAbercrombie04/02/2020  . Dyslipidemia 09/23/2019  . Toenail fungus 07/19/2018  . Depression 07/19/2018  . Iron deficiency anemia due to chronic blood loss 07/19/2018  . Gastritis and duodenitis 07/19/2018  . Atrial fibrillation with slow ventricular response (HCGlen Burnie08/15/2019  . Hyponatremia 01/28/2018  . Chronic diastolic CHF (congestive heart failure) (HCOzawkie08/15/2019  . Mixed hyperlipidemia 06/01/2017  . Paroxysmal A-fib (HCRossburg04/21/2018  . Essential hypertension 02/15/2013  . Hypertriglyceridemia 02/15/2013  . Diabetes mellitus type  2 in obese ([place]) 02/15/2013  . Coronary atherosclerosis seen on CT 02/15/2013    Past Surgical History:  Procedure Laterality Date  . BIOPSY  06/20/2018   Procedure: BIOPSY;  Surgeon: Clarene Essex, MD;  Location: [facility] ENDOSCOPY;  Service: Endoscopy;;  . CHOLECYSTECTOMY N/A 01/04/2014   Procedure: LAPAROSCOPIC CHOLECYSTECTOMY WITH INTRAOPERATIVE CHOLANGIOGRAM;  Surgeon: Ralene Ok, MD;  Location: [facility];  Service: General;   Laterality: N/A;  . COLONOSCOPY WITH PROPOFOL N/A 06/21/2018   Procedure: COLONOSCOPY WITH PROPOFOL;  Surgeon: Ronald Lobo, MD;  Location: [facility];  Service: Endoscopy;  Laterality: N/A;  . ESOPHAGOGASTRODUODENOSCOPY (EGD) WITH PROPOFOL N/A 06/20/2018   Procedure: ESOPHAGOGASTRODUODENOSCOPY (EGD) WITH PROPOFOL;  Surgeon: Clarene Essex, MD;  Location: [facility];  Service: Endoscopy;  Laterality: N/A;  . LUNG SURGERY       OB History   No obstetric history on file.     Family History  Problem Relation Age of Onset  . Heart attack Mother   . Heart attack Father   . Diabetes Sister   . High blood pressure Neg Hx   . High Cholesterol Neg Hx     Social History   Tobacco Use  . Smoking status: Never Smoker  . Smokeless tobacco: Never Used  Vaping Use  . Vaping Use: Never used  Substance Use Topics  . Alcohol use: No  . Drug use: No    Home Medications Prior to Admission medications   Medication Sig Start Date End Date Taking? Authorizing Provider  apixaban (ELIQUIS) 5 MG TABS tablet Take 1 tablet (5 mg total) by mouth 2 (two) times daily. 10/20/20 11/19/20 Yes Corena Herter, PA-C  acetaminophen (TYLENOL) 325 MG tablet Take 2 tablets (650 mg total) by mouth every 6 (six) hours as needed for mild pain (or Fever >/= 101). 02/01/18   Elgergawy, Silver Huguenin, MD  atorvastatin (LIPITOR) 40 MG tablet Take 1 tablet (40 mg total) by mouth daily. 04/18/19   Fulp, Cammie, MD  Blood Glucose Monitoring Suppl (TRUE METRIX METER) w/Device KIT Use to monitor blood sugar as directed 02/22/18   Fulp, Cammie, MD  ezetimibe (ZETIA) 10 MG tablet Take 1 tablet (10 mg total) by mouth daily. To help lower lipids 03/06/20   Fulp, Cammie, MD  ferrous sulfate 325 (65 FE) MG tablet TAKE 1 TABLET (325 MG TOTAL) BY MOUTH 2 (TWO) TIMES DAILY WITH A MEAL. 05/09/20 05/09/21  Ladell Pier, MD  glucose blood test strip USE AS INSTRUCTED TO CHECK BLOOD SUGARS 3 TIMES DAILY 03/27/20   Fulp, Cammie, MD  Insulin  Glargine (BASAGLAR KWIKPEN) 100 UNIT/ML INJECT 28 UNITS INTO THE SKIN AT BEDTIME. 03/02/20 03/02/21  Fulp, Cammie, MD  insulin glargine (LANTUS) 100 UNIT/ML injection Inject 24 Units into the skin at bedtime.    [provider]  insulin lispro (HUMALOG KWIKPEN) 100 UNIT/ML KwikPen Inject 0.1 mLs (10 Units total) into the skin 3 (three) times daily. 12/23/19   Shamleffer, Melanie Crazier, MD  Insulin Pen Needle 32G X 4 MM MISC USE WITH INSULIN PEN TO INJECT LANTUS DAILY. 12/01/19 11/30/20  Ladell Pier, MD  Insulin Syringe-Needle U-100 (TRUEPLUS INSULIN SYRINGE) 31G X 5/16" 0.3 ML MISC Use to inject insulin daily. 05/06/18   Fulp, Cammie, MD  liraglutide (VICTOZA) 18 MG/3ML SOPN INJECT 0.6 MG INTO THE SKIN DAILY. 03/12/20 03/12/21  Shamleffer, Melanie Crazier, MD  metFORMIN (GLUCOPHAGE) 500 MG tablet Take 0.5 tablets (250 mg total) by mouth 4 (four) times daily. 09/27/19   Karle Plumber  B, MD  metoprolol succinate (TOPROL-XL) 25 MG 24 hr tablet TAKE 1 TABLET (25 MG TOTAL) BY MOUTH IN THE MORNING AND AT BEDTIME. 05/08/20 05/08/21  Fenton, Clint R, PA  Multiple Vitamins-Minerals (CENTRUM SILVER PO) Take 1 tablet by mouth daily.    [provider]  mupirocin ointment (BACTROBAN) 2 % Place 1 application into the nose 2 (two) times daily. 07/19/18   Elsie Stain, MD  TRUEplus Lancets 28G MISC USE TO CHECK BLOOD SUGAR 3 TIMES DAILY 03/27/20 03/27/21  Antony Blackbird, MD    Allergies    Patient has no known allergies.  Review of Systems   Review of Systems  All other systems reviewed and are negative.   Physical Exam Updated Vital Signs BP (!) 148/101   Pulse 98   Temp 97.6 F (36.4 C) (Oral)   Resp 16   Wt 77.1 kg   SpO2 98%   BMI 30.11 kg/m   Physical Exam Vitals and nursing note reviewed. Exam conducted with a chaperone present.  Constitutional:      General: She is not in acute distress.    Appearance: She is not toxic-appearing.  HENT:     Head: Normocephalic and  atraumatic.  Eyes:     General: No scleral icterus.    Conjunctiva/sclera: Conjunctivae normal.  Cardiovascular:     Rate and Rhythm: Tachycardia present. Rhythm irregular.     Pulses: Normal pulses.  Pulmonary:     Effort: Pulmonary effort is normal. No respiratory distress.     Breath sounds: Normal breath sounds. No wheezing or rales.  Abdominal:     General: Abdomen is flat. There is no distension.     Palpations: Abdomen is soft.     Tenderness: There is no abdominal tenderness.  Musculoskeletal:     Cervical back: Normal range of motion. No rigidity.     Right lower leg: No edema.     Left lower leg: No edema.     Comments: No significant peripheral edema.  Skin:    General: Skin is dry.  Neurological:     General: No focal deficit present.     Mental Status: She is alert and oriented to person, place, and time.     GCS: GCS eye subscore is 4. GCS verbal subscore is 5. GCS motor subscore is 6.     Cranial Nerves: No cranial nerve deficit.     Sensory: No sensory deficit.     Motor: No weakness.     Coordination: Coordination normal.     Gait: Gait normal.  Psychiatric:        Mood and Affect: Mood normal.        Behavior: Behavior normal.        Thought Content: Thought content normal.     ED Results / Procedures / Treatments   Labs (all labs ordered are listed, but only abnormal results are displayed) Labs Reviewed  MAGNESIUM - Abnormal; Notable for the following components:      Result Value   Magnesium 1.2 (*)    All other components within normal limits  BASIC METABOLIC PANEL - Abnormal; Notable for the following components:   Sodium 132 (*)    Chloride 96 (*)    Glucose, Bld 311 (*)    BUN 24 (*)    Creatinine, Ser 1.07 (*)    GFR, Estimated 58 (*)    All other components within normal limits  CBC WITH DIFFERENTIAL/PLATELET  TROPONIN I (HIGH SENSITIVITY)  TROPONIN I (HIGH SENSITIVITY)    EKG None  Radiology DG Chest Portable 1 View  Result  Date: 10/20/2020 CLINICAL DATA:  65 year old female with atrial fibrillation lightheadedness. EXAM: PORTABLE CHEST 1 VIEW COMPARISON:  Portable chest 05/05/2020 and earlier. FINDINGS: Portable AP semi upright view at 0827 hours. Stable postoperative changes in the right lower lung with multiple surgical clips and mild volume loss there. Stable staple line in the right upper lung. Elsewhere the lungs appear clear. No pneumothorax or pleural effusion. Mediastinal contours remain normal. Visualized tracheal air column is within normal limits. No acute osseous abnormality identified. Paucity of bowel gas in the upper abdomen. IMPRESSION: Chronic postoperative changes to the right lung. No acute cardiopulmonary abnormality. Electronically Signed   By: Genevie Ann M.D.   On: 10/20/2020 08:55    Procedures Procedures   Medications Ordered in ED Medications  magnesium sulfate IVPB 2 g 50 mL (has no administration in time range)  sodium chloride 0.9 % bolus 500 mL (500 mLs Intravenous Bolus 10/20/20 1046)    ED Course  I have reviewed the triage vital signs and the nursing notes.  Pertinent labs & imaging results that were available during my care of the patient were reviewed by me and considered in my medical decision making (see chart for details).    MDM Rules/Calculators/A&P                          Emily Farmer was evaluated in Emergency Department on 10/20/2020 for the symptoms described in the history of present illness. She was evaluated in the context of the global COVID-19 pandemic, which necessitated consideration that the patient might be at risk for infection with the SARS-CoV-2 virus that causes COVID-19. Institutional protocols and algorithms that pertain to the evaluation of patients at risk for COVID-19 are in a state of rapid change based on information released by regulatory bodies including the CDC and federal and state organizations. These policies and algorithms were followed during the  patient's care in the ED.  I personally reviewed patient's medical chart and all notes from triage and staff during today's encounter. I have also ordered and reviewed all labs and imaging that I felt to be medically necessary in the evaluation of this patient's complaints and with consideration of their physical exam. If needed, translation services were available and utilized.   Patient in the ED after episode of near syncope, noted to be in atrial fibrillation with RVR by EMS.  Patient reports that this felt just like her previous A. fib with RVR events.  Denies any neurologic symptoms or deficits and her physical exam here in the ED is entirely benign.  She does not appear to be in any acute distress and after being treated with 10 mg diltiazem by EMS she is now relatively rate controlled.  Her most recent echocardiogram demonstrated preserved LVEF.  We will provide 500 mg NS bolus and obtain basic laboratory work-up including troponin x2 given her near syncope and chest pain equivalents, additional risk factors being that she is not on any statin medication or anticoagulation.  We will also proceed with plain films of chest.  Troponin trended x2 and stable, within normal meds.  CBC unremarkable.  BMP with hyperglycemia 311, but no other significant derangement.  Magnesium mildly low at 1.2, will replete here in the ED with 2 g.  She will need to see her primary care provider for laboratory recheck to ensure improvement.  She has been in the low 1's previously.    Patient states she already takes aspirin 81 mg daily.  Consulted TOC to inquire about medication assistance given cited financial constraints.  They were able to provide her with a 30-day Eliquis card.  We will start her on Eliquis 5 mg twice daily and encourage close outpatient follow-up with her cardiologist and primary care provider.  Discussed case with Dr. Vallery Ridge who agrees with assessment and plan.  ER return precautions discussed.   Patient voices understanding and is agreeable to the plan.  Final Clinical Impression(s) / ED Diagnoses Final diagnoses:  Longstanding persistent atrial fibrillation [facility])    Rx / DC Orders ED Discharge Orders         Ordered    apixaban (ELIQUIS) 5 MG TABS tablet  2 times daily        10/20/20 1222           Reita Chard 10/20/20 1238    Charlesetta Shanks, MD 10/20/20 1435

## 2020-10-20 NOTE — Discharge Instructions (Addendum)
Please call your cardiologist, Dr. Rennis Golden, regarding today's ED encounter for ongoing management of your atrial fibrillation.  I prescribed you Eliquis 5 mg twice daily.  Please take, as prescribed.  Continue take your aspirin 81 mg daily.  Please follow-up with your primary care provider regarding today's encounter, as well.  If you have not yet established, please call the [facility] and [facility] on Monday.    Return to the ER seek immediate medical attention should you experience any new or worsening symptoms

## 2020-10-20 NOTE — ED Triage Notes (Signed)
Hx of afib was oln her way into work when she became lightheaded.  She pulled over for a little while felt better went into work started to feel like she couldn't breath.  HR 120-160's afib RVR.  10mg  dilt given ivp HR now 104 improved SOB

## 2020-10-20 NOTE — TOC Initial Note (Signed)
Transition of Care [facility]) - Initial/Assessment Note    Patient Details  Name: Emily Farmer MRN: [id number] Date of Birth: 1955/09/18  Transition of Care [facility]) CM/SW Contact:    Lockie Pares, RN Phone Number: 10/20/2020, 11:40 AM  Clinical Narrative:                  Patient has N medicaid family planning- not eligible for [facility] resources to be given to patient by CSW for eliquis 30 day card. If have not already used. Referred to [facility]  For medication assistance  and primary establishment. With a place that has finacial planning, pharmacy and PCP   Barriers to Discharge: ED PCP establishment   Patient Goals and CMS Choice        Expected Discharge Plan and Services                                                Prior Living Arrangements/Services                       Activities of Daily Living      Permission Sought/Granted                  Emotional Assessment              Admission diagnosis:  afib  Patient Active Problem List   Diagnosis Date Noted  . Secondary hypercoagulable state (HCC) 05/08/2020  . Type 2 diabetes mellitus with diabetic polyneuropathy, with long-term current use of insulin (HCC) 09/23/2019  . Type 2 diabetes mellitus with hyperglycemia, with long-term current use of insulin (HCC) 09/23/2019  . Dyslipidemia 09/23/2019  . Toenail fungus 07/19/2018  . Depression 07/19/2018  . Iron deficiency anemia due to chronic blood loss 07/19/2018  . Gastritis and duodenitis 07/19/2018  . Atrial fibrillation with slow ventricular response (HCC) 01/28/2018  . Hyponatremia 01/28/2018  . Chronic diastolic CHF (congestive heart failure) (HCC) 01/28/2018  . Mixed hyperlipidemia 06/01/2017  . Paroxysmal A-fib (HCC) 10/04/2016  . Essential hypertension 02/15/2013  . Hypertriglyceridemia 02/15/2013  . Diabetes mellitus type 2 in obese (HCC) 02/15/2013  . Coronary atherosclerosis seen on CT 02/15/2013   PCP:  Cain Saupe,  MD (Inactive) Pharmacy:   [facility] [address] [place] [address] Phone: [phone] Fax: [phone]     Social Determinants of Health (SDOH) Interventions    Readmission Risk Interventions No flowsheet data found.

## 2020-10-22 ENCOUNTER — Telehealth: Payer: Self-pay | Admitting: Internal Medicine

## 2020-10-22 ENCOUNTER — Other Ambulatory Visit: Payer: Self-pay

## 2020-10-22 NOTE — Telephone Encounter (Signed)
Pt states that she was in the hospital [facility] 10-20-20 for Afib, she had a CXR and EKG performed and everything was okay. The discharge Dr. Asked her to f/u with Dr. Rennis Golden and she told them that she has an appt for September and she was told that was fine but she will need to call and make sure our office is aware. Pt states if you need to return the call,she can be reached on her sisters cell phone listed.

## 2020-10-22 NOTE — Telephone Encounter (Signed)
Left message for pt stating that pt can keep her appointment for September unless she feels like she needs to be seen sooner.

## 2020-10-23 ENCOUNTER — Ambulatory Visit (HOSPITAL_BASED_OUTPATIENT_CLINIC_OR_DEPARTMENT_OTHER): Payer: Self-pay | Attending: Cardiovascular Disease | Admitting: Cardiovascular Disease

## 2020-10-23 ENCOUNTER — Other Ambulatory Visit: Payer: Self-pay

## 2020-10-23 DIAGNOSIS — I4891 Unspecified atrial fibrillation: Secondary | ICD-10-CM

## 2020-10-23 DIAGNOSIS — I5032 Chronic diastolic (congestive) heart failure: Secondary | ICD-10-CM

## 2020-10-23 DIAGNOSIS — G4733 Obstructive sleep apnea (adult) (pediatric): Secondary | ICD-10-CM | POA: Insufficient documentation

## 2020-10-26 ENCOUNTER — Encounter (HOSPITAL_BASED_OUTPATIENT_CLINIC_OR_DEPARTMENT_OTHER): Payer: Self-pay | Admitting: Cardiovascular Disease

## 2020-10-26 NOTE — Procedures (Signed)
Patient Name: Emily Farmer, [place] Date: 10/23/2020 Gender: Female D.O.B: [birth year] Age (years): 64 Referring Provider: Nadean Corwin Hilty Height (inches): 63 Interpreting Physician: Shelva Majestic MD, ABSM Weight (lbs): 170 RPSGT: Carolin Coy BMI: 30 MRN: [id number] Neck Size: 14.00   CLINICAL INFORMATION The patient is referred for a BiPAP titration to treat sleep apnea.  Date of HST: 08/15/2020: AHI 22.3/h; supine sleep AHI 33.0%; O2 nadir 62%.  SLEEP STUDY TECHNIQUE As per the AASM Manual for the Scoring of Sleep and Associated Events v2.3 (April 2016) with a hypopnea requiring 4% desaturations.  The channels recorded and monitored were frontal, central and occipital EEG, electrooculogram (EOG), submentalis EMG (chin), nasal and oral airflow, thoracic and abdominal wall motion, anterior tibialis EMG, snore microphone, electrocardiogram, and pulse oximetry. Bilevel positive airway pressure (BPAP) was initiated at the beginning of the study and titrated to treat sleep-disordered breathing.  MEDICATIONS acetaminophen (TYLENOL) 325 MG tablet apixaban (ELIQUIS) 5 MG TABS tablet atorvastatin (LIPITOR) 40 MG tablet Blood Glucose Monitoring Suppl (TRUE METRIX METER) w/Device KIT ezetimibe (ZETIA) 10 MG tablet ferrous sulfate 325 (65 FE) MG tablet glucose blood test strip Insulin Glargine (BASAGLAR KWIKPEN) 100 UNIT/ML insulin glargine (LANTUS) 100 UNIT/ML injection insulin lispro (HUMALOG KWIKPEN) 100 UNIT/ML KwikPen Insulin Pen Needle 32G X 4 MM MISC Insulin Syringe-Needle U-100 (TRUEPLUS INSULIN SYRINGE) 31G X 5/16" 0.3 ML MISC liraglutide (VICTOZA) 18 MG/3ML SOPN metFORMIN (GLUCOPHAGE) 500 MG tablet metoprolol succinate (TOPROL-XL) 25 MG 24 hr tablet metoprolol succinate (TOPROL-XL) 25 MG 24 hr tablet Multiple Vitamins-Minerals (CENTRUM SILVER PO) mupirocin ointment (BACTROBAN) 2 % TRUEplus Lancets 28G MISC Medications self-administered by patient taken the night  of the study : METFORMIN, LANTUS  RESPIRATORY PARAMETERS Optimal IPAP Pressure (cm): 23 AHI at Optimal Pressure (/hr) 0 Optimal EPAP Pressure (cm): 19   Overall Minimal O2 (%): 83.0 Minimal O2 at Optimal Pressure (%): 94.0  SLEEP ARCHITECTURE Start Time: 11:01:56 PM Stop Time: 5:10:43 AM Total Time (min): 368.8 Total Sleep Time (min): 215.5 Sleep Latency (min): 20.9 Sleep Efficiency (%): 58.4% REM Latency (min): 46.0 WASO (min): 132.4 Stage N1 (%): 16.5% Stage N2 (%): 70.8% Stage N3 (%): 0.7% Stage R (%): 12.1 Supine (%): 68.68 Arousal Index (/hr): 55.7   CARDIAC DATA The 2 lead EKG demonstrated atrial fibrillation. The mean heart rate was 103.5 beats per minute. Other EKG findings include: None.  LEG MOVEMENT DATA The total Periodic Limb Movements of Sleep (PLMS) were 0. The PLMS index was 0.0. A PLMS index of <15 is considered normal in adults.  IMPRESSIONS - CPAP was initiated at 5 cm, was increased to 17 cm and due to continued significant events was transitioned to BiPAP and titrated to 23/19 cm of water (AHI 0; RDI 7.3/h; O2 nadir 94%). - Central sleep apnea was not noted during this titration (CAI = 2.2/h). - Moderate oxygen desaturations to a nadir of 83% at 13 cm of water. - The patient snored with moderate snoring volume. - Cardiac abnormalities were observed during this study: atrial fibrillation - Clinically significant periodic limb movements were not noted during this study. Arousals associated with PLMs were rare.  DIAGNOSIS - Obstructive Sleep Apnea (G47.33)  RECOMMENDATIONS - Recommend an initial trial of BiPAP Auto therapy at a minimum EPAP of 19, PS of 4 and maximum IPAP at 25 cm of water with heated humidification. A  Small size Resmed Full Face Mask Mirage Quattro mask was used for the titration. - Effort should be made to optimize nasal and oropharyngeal  patency. - Avoid alcohol, sedatives and other CNS depressants that may worsen sleep apnea and disrupt normal  sleep architecture. - Sleep hygiene should be reviewed to assess factors that may improve sleep quality. - Weight management (BMI 30) and regular exercise should be initiated or continued. - Recommend a download in 30 days and sleep clinic evaluation after 4 weeks of therapy.  [Electronically signed] 10/26/2020 02:16 PM  Shelva Majestic MD, [facility] [place], American Board of Sleep Medicine   NPI: [id number] [place] PH: [phone]   FX: [phone] [place]

## 2020-11-06 ENCOUNTER — Telehealth: Payer: Self-pay | Admitting: *Deleted

## 2020-11-06 NOTE — Telephone Encounter (Signed)
-----   Message from Thomas A Kelly, MD sent at 10/26/2020  2:21 PM EDT ----- Crystal Ellwood, please notify pt and set up with DME for BiPAP initiation. 

## 2020-11-06 NOTE — Telephone Encounter (Signed)
Left message BIPAP titration has been completed and machine order placed with [facility]. Contact information for both [facility] and myself left for patient.

## 2020-11-06 NOTE — Telephone Encounter (Deleted)
-----   Message from Lennette Bihari, MD sent at 10/26/2020  2:21 PM EDT ----- Burna Mortimer, please notify pt and set up with DME for BiPAP initiation.

## 2020-11-14 ENCOUNTER — Other Ambulatory Visit: Payer: Self-pay

## 2020-11-14 MED FILL — Insulin Glargine Soln Pen-Injector 100 Unit/ML: SUBCUTANEOUS | 23 days supply | Qty: 9 | Fill #1 | Status: AC

## 2020-11-14 MED FILL — Metoprolol Succinate Tab ER 24HR 25 MG (Tartrate Equiv): ORAL | 30 days supply | Qty: 60 | Fill #1 | Status: AC

## 2020-11-19 ENCOUNTER — Telehealth: Payer: Self-pay | Admitting: *Deleted

## 2020-11-19 NOTE — Telephone Encounter (Signed)
Call received from [facility] with [facility]. Patient has [facility], and therefore her benefits are limited and she cannot get a BIPAP machine. She does not have any DME benefits. I will attempt to get her whatever I can through the [employer] CPAP assistance program.

## 2020-11-19 NOTE — Telephone Encounter (Signed)
ok 

## 2020-11-29 ENCOUNTER — Ambulatory Visit: Payer: Medicaid Other | Admitting: Internal Medicine

## 2020-12-05 ENCOUNTER — Telehealth: Payer: Self-pay | Admitting: *Deleted

## 2020-12-05 NOTE — Telephone Encounter (Signed)
Left message to return a call in reference to her BIPAP machine.

## 2020-12-05 NOTE — Telephone Encounter (Signed)
Patient returned my call and was informed that her medicaid will not cover the BIPAP machine ordered by Dr Tresa Endo. She will be referred to the CPAP assistance program.  She was made aware that she my have to pay $125 out of her pocket. She agrees to proceed and will come by the office tomorrow to complete the paperwork.

## 2020-12-07 ENCOUNTER — Telehealth: Payer: Self-pay | Admitting: *Deleted

## 2020-12-07 NOTE — Telephone Encounter (Signed)
BIPAP application faxed to the [employer] assistance program.

## 2020-12-31 ENCOUNTER — Other Ambulatory Visit: Payer: Self-pay

## 2020-12-31 MED FILL — Metoprolol Succinate Tab ER 24HR 25 MG (Tartrate Equiv): ORAL | 30 days supply | Qty: 60 | Fill #2 | Status: AC

## 2021-01-01 ENCOUNTER — Other Ambulatory Visit: Payer: Self-pay

## 2021-01-03 ENCOUNTER — Other Ambulatory Visit: Payer: Self-pay

## 2021-01-03 ENCOUNTER — Emergency Department (HOSPITAL_COMMUNITY)
Admission: EM | Admit: 2021-01-03 | Discharge: 2021-01-03 | Disposition: A | Discharge status: Home / Self Care | Payer: Medicaid Other | Attending: Emergency Medicine | Admitting: Emergency Medicine

## 2021-01-03 ENCOUNTER — Emergency Department (HOSPITAL_COMMUNITY): Payer: Medicaid Other

## 2021-01-03 DIAGNOSIS — I4891 Unspecified atrial fibrillation: Secondary | ICD-10-CM | POA: Insufficient documentation

## 2021-01-03 DIAGNOSIS — I110 Hypertensive heart disease with heart failure: Secondary | ICD-10-CM | POA: Insufficient documentation

## 2021-01-03 DIAGNOSIS — Z7984 Long term (current) use of oral hypoglycemic drugs: Secondary | ICD-10-CM | POA: Insufficient documentation

## 2021-01-03 DIAGNOSIS — N3000 Acute cystitis without hematuria: Secondary | ICD-10-CM | POA: Insufficient documentation

## 2021-01-03 DIAGNOSIS — I2510 Atherosclerotic heart disease of native coronary artery without angina pectoris: Secondary | ICD-10-CM | POA: Insufficient documentation

## 2021-01-03 DIAGNOSIS — Z794 Long term (current) use of insulin: Secondary | ICD-10-CM | POA: Insufficient documentation

## 2021-01-03 DIAGNOSIS — E1142 Type 2 diabetes mellitus with diabetic polyneuropathy: Secondary | ICD-10-CM | POA: Insufficient documentation

## 2021-01-03 DIAGNOSIS — Z7901 Long term (current) use of anticoagulants: Secondary | ICD-10-CM | POA: Insufficient documentation

## 2021-01-03 DIAGNOSIS — I5032 Chronic diastolic (congestive) heart failure: Secondary | ICD-10-CM | POA: Insufficient documentation

## 2021-01-03 DIAGNOSIS — Z79899 Other long term (current) drug therapy: Secondary | ICD-10-CM | POA: Insufficient documentation

## 2021-01-03 DIAGNOSIS — E860 Dehydration: Secondary | ICD-10-CM | POA: Insufficient documentation

## 2021-01-03 LAB — I-STAT VENOUS BLOOD GAS, ED
Acid-base deficit: 7 mmol/L — ABNORMAL HIGH (ref 0.0–2.0)
Bicarbonate: 19.3 mmol/L — ABNORMAL LOW (ref 20.0–28.0)
Calcium, Ion: 1.11 mmol/L — ABNORMAL LOW (ref 1.15–1.40)
HCT: 40 % (ref 36.0–46.0)
Hemoglobin: 13.6 g/dL (ref 12.0–15.0)
O2 Saturation: 47 %
Potassium: 5 mmol/L (ref 3.5–5.1)
Sodium: 135 mmol/L (ref 135–145)
TCO2: 21 mmol/L — ABNORMAL LOW (ref 22–32)
pCO2, Ven: 38.9 mmHg — ABNORMAL LOW (ref 44.0–60.0)
pH, Ven: 7.305 (ref 7.250–7.430)
pO2, Ven: 28 mmHg — CL (ref 32.0–45.0)

## 2021-01-03 LAB — URINALYSIS, COMPLETE (UACMP) WITH MICROSCOPIC
Bilirubin Urine: NEGATIVE
Glucose, UA: >=500 mg/dL — AB
Hgb urine dipstick: NEGATIVE
Ketones, ur: NEGATIVE mg/dL
Nitrite: POSITIVE — AB
Protein, ur: 30 mg/dL — AB
Specific Gravity, Urine: 1.021 (ref 1.005–1.030)
WBC, UA: >50 WBC/hpf — ABNORMAL HIGH (ref 0–5)
pH: 5 (ref 5.0–8.0)

## 2021-01-03 LAB — BASIC METABOLIC PANEL
Anion gap: 13 (ref 5–15)
BUN: 33 mg/dL — ABNORMAL HIGH (ref 8–23)
CO2: 18 mmol/L — ABNORMAL LOW (ref 22–32)
Calcium: 8.6 mg/dL — ABNORMAL LOW (ref 8.9–10.3)
Chloride: 102 mmol/L (ref 98–111)
Creatinine, Ser: 1.5 mg/dL — ABNORMAL HIGH (ref 0.44–1.00)
GFR, Estimated: 39 mL/min — ABNORMAL LOW (ref >60)
Glucose, Bld: 443 mg/dL — ABNORMAL HIGH (ref 70–99)
Potassium: 4.9 mmol/L (ref 3.5–5.1)
Sodium: 133 mmol/L — ABNORMAL LOW (ref 135–145)

## 2021-01-03 LAB — CBC WITH DIFFERENTIAL/PLATELET
Abs Immature Granulocytes: 0.03 10*3/uL (ref 0.00–0.07)
Basophils Absolute: 0 10*3/uL (ref 0.0–0.1)
Basophils Relative: 0 %
Eosinophils Absolute: 0.2 10*3/uL (ref 0.0–0.5)
Eosinophils Relative: 2 %
HCT: 39.6 % (ref 36.0–46.0)
Hemoglobin: 13 g/dL (ref 12.0–15.0)
Immature Granulocytes: 0 %
Lymphocytes Relative: 19 %
Lymphs Abs: 1.4 10*3/uL (ref 0.7–4.0)
MCH: 29.4 pg (ref 26.0–34.0)
MCHC: 32.8 g/dL (ref 30.0–36.0)
MCV: 89.6 fL (ref 80.0–100.0)
Monocytes Absolute: 0.6 10*3/uL (ref 0.1–1.0)
Monocytes Relative: 7 %
Neutro Abs: 5.3 10*3/uL (ref 1.7–7.7)
Neutrophils Relative %: 72 %
Platelets: 205 10*3/uL (ref 150–400)
RBC: 4.42 MIL/uL (ref 3.87–5.11)
RDW: 12.8 % (ref 11.5–15.5)
WBC: 7.5 10*3/uL (ref 4.0–10.5)
nRBC: 0 % (ref 0.0–0.2)

## 2021-01-03 LAB — MAGNESIUM: Magnesium: 1.4 mg/dL — ABNORMAL LOW (ref 1.7–2.4)

## 2021-01-03 LAB — TROPONIN I (HIGH SENSITIVITY)
Troponin I (High Sensitivity): 8 ng/L (ref <18)
Troponin I (High Sensitivity): 10 ng/L (ref <18)

## 2021-01-03 LAB — BRAIN NATRIURETIC PEPTIDE: B Natriuretic Peptide: 94.1 pg/mL (ref 0.0–100.0)

## 2021-01-03 LAB — D-DIMER, QUANTITATIVE: D-Dimer, Quant: 0.3 ug/mL-FEU (ref 0.00–0.50)

## 2021-01-03 MED ORDER — SODIUM CHLORIDE 0.9 % IV BOLUS
500.0000 mL | Freq: Once | INTRAVENOUS | Status: AC
  Administered 2021-01-03: 500 mL via INTRAVENOUS

## 2021-01-03 MED ORDER — CEPHALEXIN 500 MG PO CAPS
500.0000 mg | ORAL_CAPSULE | Freq: Two times a day (BID) | ORAL | 0 refills | Status: AC
  Filled 2021-01-03: qty 14, 7d supply, fill #0

## 2021-01-03 NOTE — ED Triage Notes (Signed)
Pt disclosed to nurse that she took 1 of her sister's sublingual nitro when she began feeling bad and reported neck and chest pain. Pt dizziness followed nitro administration

## 2021-01-03 NOTE — Discharge Instructions (Addendum)
It was a pleasure taking care of you!  You came in for shortness of breath were found to have a urinary tract infection, which we are treating with an antibiotic Keflex twice a day for 7 days.  Also recommend you drinking plenty of water and staying hydrated at home.  Please schedule to see your PCP about your diabetes, and to ensure you are on the correct insulin regimen since your sugars were very elevated today. If you continue to have difficulty breathing, fevers, worsening pain come back in to the hospital.

## 2021-01-03 NOTE — ED Notes (Signed)
Pt ambulated to the bathroom with 1 assist for safety. Pt ambulated on her own power, gait even and steady

## 2021-01-03 NOTE — ED Notes (Signed)
Patient transported to X-ray 

## 2021-01-03 NOTE — ED Notes (Signed)
Pt was able to ambulate to the restroom to provide urine sample.  ?

## 2021-01-03 NOTE — ED Provider Notes (Signed)
[facility] Provider Note   CSN: [id number] Arrival date & time: 01/03/21  1107     History Chief Complaint  Patient presents with   Dizziness   Atrial Fibrillation   Hyperglycemia    Emily Farmer is a 65 y.o. female with a history of chronic a fib presents due to SOB.  States she got overheated and dehydrated while she was out walking this morning, and then she was tired, getting SOB so went to sit down and felt pain in the neck and ear. Stood up to try to go back home and got dizzy. States her SOB is how she has felt with previously when in a fib. Denies current difficulty breathing,. States when she put ice behind her head felt better. Denies chest pain, palpitations. Denies change in vision. Denies recent fever or infection. Denies blood in stool, issus with urination. Denies syncope   Takes metoprolol 68m last took this morning  Eliquis 588mBID last took last night  Metformin and Lantus 24units daily   Denies cigarette use, alcohol or recreational drug use  Follows with cardiologist Dr. HiDebara Pickett  Past Medical History:  Diagnosis Date   Arthritis    [place]rial [place] ([facility]   a. 09/2016 in setting of pancreatitis;  b. 09/2016 Echo: EF 65-60%, no rwma, Gr2 DD, mild MR, triv TR, PASP 3073m;  CHA2DS2VASc = 4-->Eliquis 5mg33mD. c. Recurred 06/2018 in setting of GIB.   Chronic diastolic CHF (congestive heart failure) (HCC)[place]15/2019   Diabetes mellitus    GI bleeding 06/2018   Gout    Hyperlipidemia    Hypertension    Hypertriglyceridemia    Pancreatitis    a. 09/2016 - Triglycerides 1,392 on admission.    Patient Active Problem List   Diagnosis Date Noted   Secondary hypercoagulable state (HCC)[place]/23/2021   Type 2 diabetes mellitus with diabetic polyneuropathy, with long-term current use of insulin (HCC)[place]/02/2020   Type 2 diabetes mellitus with hyperglycemia, with long-term current use of insulin (HCC)[place]/02/2020   Dyslipidemia  09/23/2019   Toenail fungus 07/19/2018   Depression 07/19/2018   Iron deficiency anemia due to chronic blood loss 07/19/2018   Gastritis and duodenitis 07/19/2018   Atrial fibrillation with slow ventricular response (HCC)[place]/15/2019   Hyponatremia 01/28/2018   Chronic diastolic CHF (congestive heart failure) (HCC)[place]/15/2019   Mixed hyperlipidemia 06/01/2017   Paroxysmal A-fib (HCC)[place]/21/2018   Essential hypertension 02/15/2013   Hypertriglyceridemia 02/15/2013   Diabetes mellitus type 2 in obese (HCC)[place]/07/2012   Coronary atherosclerosis seen on CT 02/15/2013    Past Surgical History:  Procedure Laterality Date   BIOPSY  06/20/2018   Procedure: BIOPSY;  Surgeon: MagoClarene Essex;  Location: [facility] ETaftervice: Endoscopy;;   CHOLECYSTECTOMY N/A 01/04/2014   Procedure: LAPAROSCOPIC CHOLECYSTECTOMY WITH INTRAOPERATIVE CHOLANGIOGRAM;  Surgeon: ArmaRalene Ok;  [facility]: [facility]: General;  Laterality: N/A;   COLONOSCOPY WITH PROPOFOL N/A 06/21/2018   Procedure: COLONOSCOPY WITH PROPOFOL;  Surgeon: BuccRonald Lobo;  [facility]oscopy;  Laterality: N/A;   ESOPHAGOGASTRODUODENOSCOPY (EGD) WITH PROPOFOL N/A 06/20/2018   Procedure: ESOPHAGOGASTRODUODENOSCOPY (EGD) WITH PROPOFOL;  Surgeon: MagoClarene Essex;  [facility]ervice: Endoscopy;  Laterality: N/A;   LUNG SURGERY       OB History   No obstetric history on file.     Family History  Problem Relation Age of Onset   Heart attack Mother  Heart attack Father    Diabetes Sister    High blood pressure Neg Hx    High Cholesterol Neg Hx     Social History   Tobacco Use   Smoking status: Never   Smokeless tobacco: Never  Vaping Use   Vaping Use: Never used  Substance Use Topics   Alcohol use: No   Drug use: No    Home Medications Prior to Admission medications   Medication Sig Start Date End Date Taking? Authorizing Provider  acetaminophen (TYLENOL) 325 MG tablet  Take 2 tablets (650 mg total) by mouth every 6 (six) hours as needed for mild pain (or Fever >/= 101). 02/01/18  Yes Elgergawy, Silver Huguenin, MD  apixaban (ELIQUIS) 5 MG TABS tablet Take 1 tablet (5 mg total) by mouth 2 (two) times daily. 10/20/20 01/03/21 Yes Corena Herter, PA-C  atorvastatin (LIPITOR) 40 MG tablet Take 1 tablet (40 mg total) by mouth daily. 04/18/19  Yes Fulp, Cammie, MD  Blood Glucose Monitoring Suppl (TRUE METRIX METER) w/Device KIT Use to monitor blood sugar as directed Patient taking differently: 1 each by Other route See admin instructions. Use to monitor blood sugar as directed 02/22/18  Yes Fulp, Cammie, MD  cephALEXin (KEFLEX) 500 MG capsule Take 1 capsule (500 mg total) by mouth 2 (two) times daily for 7 days. 01/03/21 01/10/21 Yes Dominica Kent, Weldon Picking, DO  ezetimibe (ZETIA) 10 MG tablet Take 1 tablet (10 mg total) by mouth daily. To help lower lipids 03/06/20  Yes Fulp, Cammie, MD  ferrous sulfate 325 (65 FE) MG tablet TAKE 1 TABLET (325 MG TOTAL) BY MOUTH 2 (TWO) TIMES DAILY WITH A MEAL. Patient taking differently: Take 325 mg by mouth 2 (two) times daily with a meal. 05/09/20 05/09/21 Yes Ladell Pier, MD  glucose blood test strip USE AS INSTRUCTED TO CHECK BLOOD SUGARS 3 TIMES DAILY Patient taking differently: 1 each by Other route See admin instructions. USE AS INSTRUCTED TO CHECK BLOOD SUGARS 3 TIMES DAILY 03/27/20  Yes Fulp, Cammie, MD  Insulin Glargine (BASAGLAR KWIKPEN) 100 UNIT/ML INJECT 28 UNITS INTO THE SKIN AT BEDTIME. Patient taking differently: Inject 28 Units into the skin at bedtime. 03/02/20 03/02/21 Yes Fulp, Cammie, MD  insulin glargine (LANTUS) 100 UNIT/ML injection Inject 24 Units into the skin at bedtime.   Yes [provider]  insulin lispro (HUMALOG KWIKPEN) 100 UNIT/ML KwikPen Inject 0.1 mLs (10 Units total) into the skin 3 (three) times daily. 12/23/19  Yes Shamleffer, Melanie Crazier, MD  Insulin Syringe-Needle U-100 (TRUEPLUS INSULIN SYRINGE) 31G X  5/16" 0.3 ML MISC Use to inject insulin daily. Patient taking differently: 1 each by Other route See admin instructions. Use to inject insulin daily. 05/06/18  Yes Fulp, Cammie, MD  liraglutide (VICTOZA) 18 MG/3ML SOPN INJECT 0.6 MG INTO THE SKIN DAILY. Patient taking differently: Inject 18 mg into the skin daily. 03/12/20 03/12/21 Yes Shamleffer, Melanie Crazier, MD  metFORMIN (GLUCOPHAGE) 500 MG tablet Take 0.5 tablets (250 mg total) by mouth 4 (four) times daily. 09/27/19  Yes Ladell Pier, MD  metoprolol succinate (TOPROL-XL) 25 MG 24 hr tablet Take 1 tablet (25 mg total) by mouth daily. Take 2 tablets, 50 mg Toprol-XL daily.  If your heart rate is less than 60 and your blood pressure is lower than 110/60, go back to your once daily tablet. Patient taking differently: Take 25 mg by mouth 2 (two) times daily. 10/20/20  Yes Charlesetta Shanks, MD  Multiple Vitamins-Minerals (CENTRUM SILVER PO) Take  1 tablet by mouth daily.   Yes [provider]  mupirocin ointment (BACTROBAN) 2 % Place 1 application into the nose 2 (two) times daily. 07/19/18  Yes Elsie Stain, MD  TRUEplus Lancets 28G MISC USE TO CHECK BLOOD SUGAR 3 TIMES DAILY Patient taking differently: 1 each by Other route See admin instructions. USE TO CHECK BLOOD SUGAR 3 TIMES DAILY 03/27/20 03/27/21 Yes Fulp, Cammie, MD  metoprolol succinate (TOPROL-XL) 25 MG 24 hr tablet TAKE 1 TABLET (25 MG TOTAL) BY MOUTH IN THE MORNING AND AT BEDTIME. Patient not taking: No sig reported 05/08/20 05/08/21  Fenton, Berton Mount R, PA    Allergies    Patient has no known allergies.  Review of Systems   Review of Systems  Constitutional:  Negative for chills and fever.  Eyes:  Negative for visual disturbance.  Respiratory:  Positive for shortness of breath. Negative for chest tightness.   Cardiovascular:  Negative for chest pain and palpitations.  Gastrointestinal:  Negative for nausea and vomiting.  Neurological:  Positive for headaches.  All  other systems reviewed and are negative.  Physical Exam Updated Vital Signs BP (!) 153/100   Pulse 98   Temp 98.1 F (36.7 C) (Oral)   Resp (!) 22   SpO2 99%   Physical Exam HENT:     Head: Normocephalic and atraumatic.     Mouth/Throat:     Mouth: Mucous membranes are moist.  Eyes:     Extraocular Movements: Extraocular movements intact.     Conjunctiva/sclera: Conjunctivae normal.  Cardiovascular:     Rate and Rhythm: Tachycardia present. Rhythm irregular.     Heart sounds: No murmur heard. Pulmonary:     Effort: Pulmonary effort is normal.     Breath sounds: Normal breath sounds.  Abdominal:     General: There is no distension.     Palpations: Abdomen is soft.     Tenderness: There is no abdominal tenderness.  Musculoskeletal:     Cervical back: Normal range of motion and neck supple.  Skin:    General: Skin is warm and dry.  Neurological:     General: No focal deficit present.     Mental Status: She is alert. Mental status is at baseline.    ED Results / Procedures / Treatments   Labs (all labs ordered are listed, but only abnormal results are displayed) Labs Reviewed  BASIC METABOLIC PANEL - Abnormal; Notable for the following components:      Result Value   Sodium 133 (*)    CO2 18 (*)    Glucose, Bld 443 (*)    BUN 33 (*)    Creatinine, Ser 1.50 (*)    Calcium 8.6 (*)    GFR, Estimated 39 (*)    All other components within normal limits  MAGNESIUM - Abnormal; Notable for the following components:   Magnesium 1.4 (*)    All other components within normal limits  URINALYSIS, COMPLETE (UACMP) WITH MICROSCOPIC - Abnormal; Notable for the following components:   APPearance CLOUDY (*)    Glucose, UA >=500 (*)    Protein, ur 30 (*)    Nitrite POSITIVE (*)    Leukocytes,Ua LARGE (*)    WBC, UA >50 (*)    Bacteria, UA MANY (*)    All other components within normal limits  I-STAT VENOUS BLOOD GAS, ED - Abnormal; Notable for the following components:    pCO2, Ven 38.9 (*)    pO2, Ven 28.0 (*)  Bicarbonate 19.3 (*)    TCO2 21 (*)    Acid-base deficit 7.0 (*)    Calcium, Ion 1.11 (*)    All other components within normal limits  CBC WITH DIFFERENTIAL/PLATELET  BRAIN NATRIURETIC PEPTIDE  D-DIMER, QUANTITATIVE  BLOOD GAS, VENOUS  TROPONIN I (HIGH SENSITIVITY)  TROPONIN I (HIGH SENSITIVITY)    EKG EKG Interpretation  Date/Time:  Thursday January 03 2021 12:01:04 EDT Ventricular Rate:  127 PR Interval:    QRS Duration: 68 QT Interval:  296 QTC Calculation: 431 R Axis:   38 Text Interpretation: Atrial fibrillation Ventricular tachycardia, unsustained Anterior infarct, old Nonspecific T abnormalities, lateral leads When compard to prior, still afib but now RVR. No STEMI Confirmed by Antony Blackbird [phone]) on 01/03/2021 12:25:31 PM  Radiology DG Chest 2 View  Result Date: 01/03/2021 CLINICAL DATA:  Chest pain. EXAM: CHEST - 2 VIEW COMPARISON:  Oct 20, 2020. FINDINGS: The heart size and mediastinal contours are within normal limits. No pneumothorax or pleural effusion is noted. Postsurgical changes are seen in the right basilar region. No definite acute abnormality is noted. The visualized skeletal structures are unremarkable. IMPRESSION: No active cardiopulmonary disease. Electronically Signed   By: Marijo Conception M.D.   On: 01/03/2021 13:21    Procedures Procedures   Medications Ordered in ED Medications  sodium chloride 0.9 % bolus 500 mL (0 mLs Intravenous Stopped 01/03/21 1602)    ED Course  I have reviewed the triage vital signs and the nursing notes.  Pertinent labs & imaging results that were available during my care of the patient were reviewed by me and considered in my medical decision making (see chart for details).    MDM Rules/Calculators/A&P                           Emily Farmer is a 65 y.o. female with a history of chronic a fib presents due to SOB after taking a walk this morning and feeling dehydrated.  States she had some chest discomfort, headache, lightheadedness, denied palpitations. On presentation well appearing without CP or SOB. Cardiac exam irregularly irregular otherwise physical exam benign. Workup significant for:   UA with >500 glucose, large leukocytes, nitrite positive, >50 wbc  VBG with pCO2 38.9, pO2 28 D dimer normal  CO2 18, glucose 443, BUN 33, Cr 1.5, GFR 39 Mag 1.4 Trop negative  Negative orthostatic hypotension  Gave 500cc bolus    Symptoms likely attributable to atrial fibrillation, dehydration, and her urinary tract infection. Discharged on Keflex 552m BID for 7 days. Declined insulin. Advised to follow up with PCP soon to ensure correct diabetes regimen. Patient has follow up scheduled in Sept with her Cardiologist   Final Clinical Impression(s) / ED Diagnoses Final diagnoses:  Acute cystitis without hematuria  Dehydration  Atrial fibrillation, unspecified type ([facility]    Rx / DC Orders ED Discharge Orders          Ordered    cephALEXin (KEFLEX) 500 MG capsule  2 times daily        01/03/21 1[place]andy [place]aks DO 01/03/21 1[place]   Tegeler, CGwenyth Allegra MD 01/04/21 [id number]

## 2021-01-03 NOTE — ED Triage Notes (Signed)
Pt comes from home via EMS, went for a walk this morning starting feeling dizzy, layed herself down on side of road and started feeling warm with continued dizziness. EMS reports hypotension 87/57 initially. CBG 527, EMS reports a-fib 140-170, hx of afib, Pt denies cp or loc. Pt given 900 ml NS. 18g if RFA established. Last pressure 137/74, afib on monitor 11-145. Pt a/o x4.

## 2021-02-26 ENCOUNTER — Inpatient Hospital Stay (HOSPITAL_COMMUNITY)
Admission: EM | Admit: 2021-02-26 | Discharge: 2021-03-03 | DRG: 871 | Disposition: A | Discharge status: Home Health | Payer: Self-pay | Attending: Family Medicine | Admitting: Family Medicine

## 2021-02-26 ENCOUNTER — Emergency Department (HOSPITAL_COMMUNITY): Payer: Self-pay

## 2021-02-26 ENCOUNTER — Other Ambulatory Visit: Payer: Self-pay

## 2021-02-26 DIAGNOSIS — R079 Chest pain, unspecified: Secondary | ICD-10-CM

## 2021-02-26 DIAGNOSIS — Z8249 Family history of ischemic heart disease and other diseases of the circulatory system: Secondary | ICD-10-CM

## 2021-02-26 DIAGNOSIS — G4733 Obstructive sleep apnea (adult) (pediatric): Secondary | ICD-10-CM | POA: Diagnosis present

## 2021-02-26 DIAGNOSIS — I2510 Atherosclerotic heart disease of native coronary artery without angina pectoris: Secondary | ICD-10-CM | POA: Diagnosis present

## 2021-02-26 DIAGNOSIS — Z794 Long term (current) use of insulin: Secondary | ICD-10-CM

## 2021-02-26 DIAGNOSIS — N179 Acute kidney failure, unspecified: Secondary | ICD-10-CM | POA: Diagnosis present

## 2021-02-26 DIAGNOSIS — I248 Other forms of acute ischemic heart disease: Secondary | ICD-10-CM | POA: Diagnosis present

## 2021-02-26 DIAGNOSIS — B961 Klebsiella pneumoniae [K. pneumoniae] as the cause of diseases classified elsewhere: Secondary | ICD-10-CM | POA: Diagnosis present

## 2021-02-26 DIAGNOSIS — Z83438 Family history of other disorder of lipoprotein metabolism and other lipidemia: Secondary | ICD-10-CM

## 2021-02-26 DIAGNOSIS — M109 Gout, unspecified: Secondary | ICD-10-CM | POA: Diagnosis present

## 2021-02-26 DIAGNOSIS — Z20822 Contact with and (suspected) exposure to covid-19: Secondary | ICD-10-CM | POA: Diagnosis present

## 2021-02-26 DIAGNOSIS — E876 Hypokalemia: Secondary | ICD-10-CM | POA: Diagnosis present

## 2021-02-26 DIAGNOSIS — E1142 Type 2 diabetes mellitus with diabetic polyneuropathy: Secondary | ICD-10-CM | POA: Diagnosis present

## 2021-02-26 DIAGNOSIS — I4891 Unspecified atrial fibrillation: Secondary | ICD-10-CM | POA: Diagnosis present

## 2021-02-26 DIAGNOSIS — I4821 Permanent atrial fibrillation: Secondary | ICD-10-CM | POA: Diagnosis present

## 2021-02-26 DIAGNOSIS — N10 Acute tubulo-interstitial nephritis: Secondary | ICD-10-CM | POA: Diagnosis present

## 2021-02-26 DIAGNOSIS — Z7984 Long term (current) use of oral hypoglycemic drugs: Secondary | ICD-10-CM

## 2021-02-26 DIAGNOSIS — D509 Iron deficiency anemia, unspecified: Secondary | ICD-10-CM | POA: Diagnosis present

## 2021-02-26 DIAGNOSIS — I482 Chronic atrial fibrillation, unspecified: Secondary | ICD-10-CM

## 2021-02-26 DIAGNOSIS — I272 Pulmonary hypertension, unspecified: Secondary | ICD-10-CM | POA: Diagnosis present

## 2021-02-26 DIAGNOSIS — G9341 Metabolic encephalopathy: Secondary | ICD-10-CM | POA: Diagnosis present

## 2021-02-26 DIAGNOSIS — E782 Mixed hyperlipidemia: Secondary | ICD-10-CM | POA: Diagnosis present

## 2021-02-26 DIAGNOSIS — Z79899 Other long term (current) drug therapy: Secondary | ICD-10-CM

## 2021-02-26 DIAGNOSIS — I071 Rheumatic tricuspid insufficiency: Secondary | ICD-10-CM | POA: Diagnosis present

## 2021-02-26 DIAGNOSIS — I5032 Chronic diastolic (congestive) heart failure: Secondary | ICD-10-CM | POA: Diagnosis present

## 2021-02-26 DIAGNOSIS — I959 Hypotension, unspecified: Secondary | ICD-10-CM | POA: Diagnosis present

## 2021-02-26 DIAGNOSIS — I358 Other nonrheumatic aortic valve disorders: Secondary | ICD-10-CM | POA: Diagnosis present

## 2021-02-26 DIAGNOSIS — I110 Hypertensive heart disease with heart failure: Secondary | ICD-10-CM | POA: Diagnosis present

## 2021-02-26 DIAGNOSIS — E1165 Type 2 diabetes mellitus with hyperglycemia: Secondary | ICD-10-CM | POA: Diagnosis present

## 2021-02-26 DIAGNOSIS — K5900 Constipation, unspecified: Secondary | ICD-10-CM | POA: Diagnosis present

## 2021-02-26 DIAGNOSIS — A419 Sepsis, unspecified organism: Principal | ICD-10-CM | POA: Diagnosis present

## 2021-02-26 DIAGNOSIS — M545 Low back pain, unspecified: Secondary | ICD-10-CM | POA: Diagnosis present

## 2021-02-26 DIAGNOSIS — R6521 Severe sepsis with septic shock: Secondary | ICD-10-CM | POA: Diagnosis present

## 2021-02-26 DIAGNOSIS — Z7901 Long term (current) use of anticoagulants: Secondary | ICD-10-CM

## 2021-02-26 DIAGNOSIS — R339 Retention of urine, unspecified: Secondary | ICD-10-CM | POA: Diagnosis present

## 2021-02-26 DIAGNOSIS — Z833 Family history of diabetes mellitus: Secondary | ICD-10-CM

## 2021-02-26 LAB — COMPREHENSIVE METABOLIC PANEL
ALT: 12 U/L (ref 0–44)
AST: 14 U/L — ABNORMAL LOW (ref 15–41)
Albumin: 3.2 g/dL — ABNORMAL LOW (ref 3.5–5.0)
Alkaline Phosphatase: 105 U/L (ref 38–126)
Anion gap: 16 — ABNORMAL HIGH (ref 5–15)
BUN: 29 mg/dL — ABNORMAL HIGH (ref 8–23)
CO2: 17 mmol/L — ABNORMAL LOW (ref 22–32)
Calcium: 8.7 mg/dL — ABNORMAL LOW (ref 8.9–10.3)
Chloride: 100 mmol/L (ref 98–111)
Creatinine, Ser: 1.46 mg/dL — ABNORMAL HIGH (ref 0.44–1.00)
GFR, Estimated: 40 mL/min — ABNORMAL LOW (ref >60)
Glucose, Bld: 473 mg/dL — ABNORMAL HIGH (ref 70–99)
Potassium: 3.8 mmol/L (ref 3.5–5.1)
Sodium: 133 mmol/L — ABNORMAL LOW (ref 135–145)
Total Bilirubin: 1 mg/dL (ref 0.3–1.2)
Total Protein: 6.4 g/dL — ABNORMAL LOW (ref 6.5–8.1)

## 2021-02-26 LAB — CBC WITH DIFFERENTIAL/PLATELET
Abs Immature Granulocytes: 0.03 10*3/uL (ref 0.00–0.07)
Basophils Absolute: 0 10*3/uL (ref 0.0–0.1)
Basophils Relative: 0 %
Eosinophils Absolute: 0 10*3/uL (ref 0.0–0.5)
Eosinophils Relative: 0 %
HCT: 37.1 % (ref 36.0–46.0)
Hemoglobin: 12.4 g/dL (ref 12.0–15.0)
Immature Granulocytes: 1 %
Lymphocytes Relative: 5 %
Lymphs Abs: 0.3 10*3/uL — ABNORMAL LOW (ref 0.7–4.0)
MCH: 29.5 pg (ref 26.0–34.0)
MCHC: 33.4 g/dL (ref 30.0–36.0)
MCV: 88.3 fL (ref 80.0–100.0)
Monocytes Absolute: 0.2 10*3/uL (ref 0.1–1.0)
Monocytes Relative: 3 %
Neutro Abs: 5.3 10*3/uL (ref 1.7–7.7)
Neutrophils Relative %: 91 %
Platelets: 182 10*3/uL (ref 150–400)
RBC: 4.2 MIL/uL (ref 3.87–5.11)
RDW: 12.9 % (ref 11.5–15.5)
WBC: 5.7 10*3/uL (ref 4.0–10.5)
nRBC: 0 % (ref 0.0–0.2)

## 2021-02-26 LAB — I-STAT VENOUS BLOOD GAS, ED
Acid-base deficit: 6 mmol/L — ABNORMAL HIGH (ref 0.0–2.0)
Bicarbonate: 16.2 mmol/L — ABNORMAL LOW (ref 20.0–28.0)
Calcium, Ion: 1.03 mmol/L — ABNORMAL LOW (ref 1.15–1.40)
HCT: 36 % (ref 36.0–46.0)
Hemoglobin: 12.2 g/dL (ref 12.0–15.0)
O2 Saturation: 92 %
Potassium: 3.8 mmol/L (ref 3.5–5.1)
Sodium: 133 mmol/L — ABNORMAL LOW (ref 135–145)
TCO2: 17 mmol/L — ABNORMAL LOW (ref 22–32)
pCO2, Ven: 24.3 mmHg — ABNORMAL LOW (ref 44.0–60.0)
pH, Ven: 7.432 — ABNORMAL HIGH (ref 7.250–7.430)
pO2, Ven: 61 mmHg — ABNORMAL HIGH (ref 32.0–45.0)

## 2021-02-26 LAB — PROTIME-INR
INR: 1.1 (ref 0.8–1.2)
Prothrombin Time: 13.8 seconds (ref 11.4–15.2)

## 2021-02-26 LAB — I-STAT CHEM 8, ED
BUN: 28 mg/dL — ABNORMAL HIGH (ref 8–23)
Calcium, Ion: 1.06 mmol/L — ABNORMAL LOW (ref 1.15–1.40)
Chloride: 104 mmol/L (ref 98–111)
Creatinine, Ser: 1.3 mg/dL — ABNORMAL HIGH (ref 0.44–1.00)
Glucose, Bld: 499 mg/dL — ABNORMAL HIGH (ref 70–99)
HCT: 37 % (ref 36.0–46.0)
Hemoglobin: 12.6 g/dL (ref 12.0–15.0)
Potassium: 3.8 mmol/L (ref 3.5–5.1)
Sodium: 133 mmol/L — ABNORMAL LOW (ref 135–145)
TCO2: 16 mmol/L — ABNORMAL LOW (ref 22–32)

## 2021-02-26 LAB — LACTIC ACID, PLASMA
Lactic Acid, Venous: 3 mmol/L (ref 0.5–1.9)
Lactic Acid, Venous: 2.6 mmol/L (ref 0.5–1.9)

## 2021-02-26 LAB — LIPASE, BLOOD: Lipase: 29 U/L (ref 11–51)

## 2021-02-26 LAB — RESP PANEL BY RT-PCR (FLU A&B, COVID) ARPGX2
Influenza A by PCR: NEGATIVE
Influenza B by PCR: NEGATIVE
SARS Coronavirus 2 by RT PCR: NEGATIVE

## 2021-02-26 LAB — TROPONIN I (HIGH SENSITIVITY)
Troponin I (High Sensitivity): 17 ng/L (ref <18)
Troponin I (High Sensitivity): 31 ng/L — ABNORMAL HIGH (ref <18)

## 2021-02-26 MED ORDER — LACTATED RINGERS IV BOLUS (SEPSIS)
1000.0000 mL | Freq: Once | INTRAVENOUS | Status: AC
  Administered 2021-02-26: 1000 mL via INTRAVENOUS

## 2021-02-26 MED ORDER — VANCOMYCIN HCL 1500 MG/300ML IV SOLN
1500.0000 mg | Freq: Once | INTRAVENOUS | Status: AC
  Administered 2021-02-26: 1500 mg via INTRAVENOUS
  Filled 2021-02-26: qty 300

## 2021-02-26 MED ORDER — SODIUM CHLORIDE 0.9 % IV SOLN
2.0000 g | Freq: Two times a day (BID) | INTRAVENOUS | Status: DC
  Administered 2021-02-27: 2 g via INTRAVENOUS
  Filled 2021-02-26 (×2): qty 2

## 2021-02-26 MED ORDER — SODIUM CHLORIDE 0.9 % IV SOLN
2.0000 g | Freq: Once | INTRAVENOUS | Status: AC
  Administered 2021-02-26: 2 g via INTRAVENOUS

## 2021-02-26 MED ORDER — METRONIDAZOLE 500 MG/100ML IV SOLN
500.0000 mg | Freq: Two times a day (BID) | INTRAVENOUS | Status: DC
  Administered 2021-02-26 – 2021-02-27 (×2): 500 mg via INTRAVENOUS
  Filled 2021-02-26 (×2): qty 100

## 2021-02-26 MED ORDER — LACTATED RINGERS IV BOLUS (SEPSIS)
500.0000 mL | Freq: Once | INTRAVENOUS | Status: AC
  Administered 2021-02-26: 500 mL via INTRAVENOUS

## 2021-02-26 MED ORDER — IOHEXOL 350 MG/ML SOLN
75.0000 mL | Freq: Once | INTRAVENOUS | Status: AC | PRN
  Administered 2021-02-26: 75 mL via INTRAVENOUS

## 2021-02-26 NOTE — Sepsis Progress Note (Signed)
Monitoring for code sepsis protocol. 

## 2021-02-26 NOTE — ED Provider Notes (Signed)
[facility] EMERGENCY DEPARTMENT Provider Note   CSN: [id number] Arrival date & time: 02/26/21  1846     History Chief Complaint  Patient presents with   Back Pain   Chest Pain    Emily Farmer is a 65 y.o. female.  Pt reports her afib is acting up. Pt reports she has be vomiting today.  Pt reports coughing today and abdominal discomfort.  Pt reports some chest discomfort while in ambulance.  Fever tonight.    The history is provided by the patient. No language interpreter was used.  Back Pain Associated symptoms: abdominal pain and chest pain   Chest Pain Associated symptoms: abdominal pain, back pain, cough and vomiting   Emesis Severity:  Moderate Timing:  Constant Progression:  Worsening Chronicity:  New Recent urination:  Normal Relieved by:  Nothing Ineffective treatments:  None tried Associated symptoms: abdominal pain and cough   Associated symptoms: no URI   Risk factors: no alcohol use and no sick contacts       Past Medical History:  Diagnosis Date   Arthritis    Atrial fibrillation ([place])    a. 09/2016 in setting of pancreatitis;  b. 09/2016 Echo: EF 65-60%, no rwma, Gr2 DD, mild MR, triv TR, PASP 26mHg;  CHA2DS2VASc = 4-->Eliquis 541mBID. c. Recurred 06/2018 in setting of GIB.   Chronic diastolic CHF (congestive heart failure) (HCP[facility]8/15/2019   Diabetes mellitus    GI bleeding 06/2018   Gout    Hyperlipidemia    Hypertension    Hypertriglyceridemia    Pancreatitis    a. 09/2016 - Triglycerides 1,392 on admission.    Patient Active Problem List   Diagnosis Date Noted   Secondary hypercoagulable state (HCH[place]11/23/2021   Type 2 diabetes mellitus with diabetic polyneuropathy, with long-term current use of insulin (HCF[place]04/02/2020   Type 2 diabetes mellitus with hyperglycemia, with long-term current use of insulin (HCP[place]04/02/2020   Dyslipidemia 09/23/2019   Toenail fungus 07/19/2018   Depression 07/19/2018   Iron deficiency anemia  due to chronic blood loss 07/19/2018   Gastritis and duodenitis 07/19/2018   Atrial fibrillation with slow ventricular response (HC[facility]08/15/2019   Hyponatremia 01/28/2018   Chronic diastolic CHF (congestive heart failure) (HCP[place]08/15/2019   Mixed hyperlipidemia 06/01/2017   Paroxysmal A-fib (HCV[place]04/21/2018   Essential hypertension 02/15/2013   Hypertriglyceridemia 02/15/2013   Diabetes mellitus type 2 in obese (HCGalatia09/07/2012   Coronary atherosclerosis seen on CT 02/15/2013    Past Surgical History:  Procedure Laterality Date   BIOPSY  06/20/2018   Procedure: BIOPSY;  Surgeon: MaClarene EssexMD;  [facility]: [facility];;   CHOLECYSTECTOMY N/A 01/04/2014   Procedure: LAPAROSCOPIC CHOLECYSTECTOMY WITH INTRAOPERATIVE CHOLANGIOGRAM;  Surgeon: ArRalene OkMD;  Location: [facility] Service: General;  Laterality: N/A;   COLONOSCOPY WITH PROPOFOL N/A 06/21/2018   Procedure: COLONOSCOPY WITH PROPOFOL;  Surgeon: BuRonald LoboMD;  [facility]: [facility];  Laterality: N/A;   ESOPHAGOGASTRODUODENOSCOPY (EGD) WITH PROPOFOL N/A 06/20/2018   Procedure: ESOPHAGOGASTRODUODENOSCOPY (EGD) WITH PROPOFOL;  Surgeon: MaClarene EssexMD;  Location: [facility] Service: Endoscopy;  Laterality: N/A;   LUNG SURGERY       OB History   No obstetric history on file.     Family History  Problem Relation Age of Onset   Heart attack Mother    Heart attack Father    Diabetes Sister    High blood pressure Neg Hx  High Cholesterol Neg Hx     Social History   Tobacco Use   Smoking status: Never   Smokeless tobacco: Never  Vaping Use   Vaping Use: Never used  Substance Use Topics   Alcohol use: No   Drug use: No    Home Medications Prior to Admission medications   Medication Sig Start Date End Date Taking? Authorizing Provider  acetaminophen (TYLENOL) 325 MG tablet Take 2 tablets (650 mg total) by mouth every 6 (six) hours as needed for mild pain (or Fever  >/= 101). 02/01/18   Elgergawy, Silver Huguenin, MD  apixaban (ELIQUIS) 5 MG TABS tablet Take 1 tablet (5 mg total) by mouth 2 (two) times daily. 10/20/20 01/03/21  Corena Herter, PA-C  atorvastatin (LIPITOR) 40 MG tablet Take 1 tablet (40 mg total) by mouth daily. 04/18/19   Fulp, Cammie, MD  Blood Glucose Monitoring Suppl (TRUE METRIX METER) w/Device KIT Use to monitor blood sugar as directed Patient taking differently: 1 each by Other route See admin instructions. Use to monitor blood sugar as directed 02/22/18   Fulp, Cammie, MD  ezetimibe (ZETIA) 10 MG tablet Take 1 tablet (10 mg total) by mouth daily. To help lower lipids 03/06/20   Fulp, Cammie, MD  ferrous sulfate 325 (65 FE) MG tablet TAKE 1 TABLET (325 MG TOTAL) BY MOUTH 2 (TWO) TIMES DAILY WITH A MEAL. Patient taking differently: Take 325 mg by mouth 2 (two) times daily with a meal. 05/09/20 05/09/21  Ladell Pier, MD  glucose blood test strip USE AS INSTRUCTED TO CHECK BLOOD SUGARS 3 TIMES DAILY Patient taking differently: 1 each by Other route See admin instructions. USE AS INSTRUCTED TO CHECK BLOOD SUGARS 3 TIMES DAILY 03/27/20   Fulp, Cammie, MD  Insulin Glargine (BASAGLAR KWIKPEN) 100 UNIT/ML INJECT 28 UNITS INTO THE SKIN AT BEDTIME. Patient taking differently: Inject 28 Units into the skin at bedtime. 03/02/20 03/02/21  Fulp, Cammie, MD  insulin glargine (LANTUS) 100 UNIT/ML injection Inject 24 Units into the skin at bedtime.    [provider]  insulin lispro (HUMALOG KWIKPEN) 100 UNIT/ML KwikPen Inject 0.1 mLs (10 Units total) into the skin 3 (three) times daily. 12/23/19   Shamleffer, Melanie Crazier, MD  Insulin Syringe-Needle U-100 (TRUEPLUS INSULIN SYRINGE) 31G X 5/16" 0.3 ML MISC Use to inject insulin daily. Patient taking differently: 1 each by Other route See admin instructions. Use to inject insulin daily. 05/06/18   Fulp, Cammie, MD  liraglutide (VICTOZA) 18 MG/3ML SOPN INJECT 0.6 MG INTO THE SKIN DAILY. Patient taking  differently: Inject 18 mg into the skin daily. 03/12/20 03/12/21  Shamleffer, Melanie Crazier, MD  metFORMIN (GLUCOPHAGE) 500 MG tablet Take 0.5 tablets (250 mg total) by mouth 4 (four) times daily. 09/27/19   Ladell Pier, MD  metoprolol succinate (TOPROL-XL) 25 MG 24 hr tablet TAKE 1 TABLET (25 MG TOTAL) BY MOUTH IN THE MORNING AND AT BEDTIME. Patient not taking: No sig reported 05/08/20 05/08/21  Fenton, Clint R, PA  metoprolol succinate (TOPROL-XL) 25 MG 24 hr tablet Take 1 tablet (25 mg total) by mouth daily. Take 2 tablets, 50 mg Toprol-XL daily.  If your heart rate is less than 60 and your blood pressure is lower than 110/60, go back to your once daily tablet. Patient taking differently: Take 25 mg by mouth 2 (two) times daily. 10/20/20   Charlesetta Shanks, MD  Multiple Vitamins-Minerals (CENTRUM SILVER PO) Take 1 tablet by mouth daily.    [provider]  mupirocin ointment (BACTROBAN) 2 % Place 1 application into the nose 2 (two) times daily. 07/19/18   Elsie Stain, MD  TRUEplus Lancets 28G MISC USE TO CHECK BLOOD SUGAR 3 TIMES DAILY Patient taking differently: 1 each by Other route See admin instructions. USE TO CHECK BLOOD SUGAR 3 TIMES DAILY 03/27/20 03/27/21  Antony Blackbird, MD    Allergies    Patient has no known allergies.  Review of Systems   Review of Systems  Respiratory:  Positive for cough.   Cardiovascular:  Positive for chest pain.  Gastrointestinal:  Positive for abdominal pain and vomiting.  Musculoskeletal:  Positive for back pain.  All other systems reviewed and are negative.  Physical Exam Updated Vital Signs BP (!) 84/44   Pulse (!) 131   Temp (!) 100.5 F (38.1 C) (Oral)   Resp (!) 26   Ht _0  (1.6 m)   Wt 77.1 kg   SpO2 97%   BMI 30.11 kg/m   Physical Exam Vitals and nursing note reviewed.  Constitutional:      Appearance: She is well-developed and normal weight.  HENT:     Head: Normocephalic.  Cardiovascular:     Rate and Rhythm:  Normal rate and regular rhythm.     Heart sounds: Normal heart sounds.  Pulmonary:     Effort: Pulmonary effort is normal.     Breath sounds: Normal breath sounds.  Abdominal:     General: There is no distension.     Palpations: Abdomen is soft.  Musculoskeletal:        General: Normal range of motion.     Cervical back: Normal range of motion.  Skin:    General: Skin is warm.  Neurological:     General: No focal deficit present.     Mental Status: She is alert and oriented to person, place, and time.  Psychiatric:        Mood and Affect: Mood normal.    ED Results / Procedures / Treatments   Labs (all labs ordered are listed, but only abnormal results are displayed) Labs Reviewed  CBC WITH DIFFERENTIAL/PLATELET - Abnormal; Notable for the following components:      Result Value   Lymphs Abs 0.3 (*)    All other components within normal limits  COMPREHENSIVE METABOLIC PANEL - Abnormal; Notable for the following components:   Sodium 133 (*)    CO2 17 (*)    Glucose, Bld 473 (*)    BUN 29 (*)    Creatinine, Ser 1.46 (*)    Calcium 8.7 (*)    Total Protein 6.4 (*)    Albumin 3.2 (*)    AST 14 (*)    GFR, Estimated 40 (*)    Anion gap 16 (*)    All other components within normal limits  LACTIC ACID, PLASMA - Abnormal; Notable for the following components:   Lactic Acid, Venous 3.0 (*)    All other components within normal limits  I-STAT VENOUS BLOOD GAS, ED - Abnormal; Notable for the following components:   pH, Ven 7.432 (*)    pCO2, Ven 24.3 (*)    pO2, Ven 61.0 (*)    Bicarbonate 16.2 (*)    TCO2 17 (*)    Acid-base deficit 6.0 (*)    Sodium 133 (*)    Calcium, Ion 1.03 (*)    All other components within normal limits  I-STAT CHEM 8, ED - Abnormal; Notable for the following components:   Sodium  133 (*)    BUN 28 (*)    Creatinine, Ser 1.30 (*)    Glucose, Bld 499 (*)    Calcium, Ion 1.06 (*)    TCO2 16 (*)    All other components within normal limits  RESP  PANEL BY RT-PCR (FLU A&B, COVID) ARPGX2  CULTURE, BLOOD (ROUTINE X 2)  CULTURE, BLOOD (ROUTINE X 2)  LIPASE, BLOOD  PROTIME-INR  LACTIC ACID, PLASMA  URINALYSIS, ROUTINE W REFLEX MICROSCOPIC  TROPONIN I (HIGH SENSITIVITY)  TROPONIN I (HIGH SENSITIVITY)    EKG None  Radiology DG Chest 1 View  Result Date: 02/26/2021 CLINICAL DATA:  Chest pain EXAM: CHEST  1 VIEW COMPARISON:  01/03/2021 FINDINGS: Lungs are clear.  No pleural effusion or pneumothorax. The heart is normal in size. Surgical clips overlying the right chest wall. IMPRESSION: No evidence of acute cardiopulmonary disease. Electronically Signed   By: Julian Hy M.D.   On: 02/26/2021 20:31   CT ABDOMEN PELVIS W CONTRAST  Result Date: 02/26/2021 CLINICAL DATA:  Acute abdominal pain. EXAM: CT ABDOMEN AND PELVIS WITH CONTRAST TECHNIQUE: Multidetector CT imaging of the abdomen and pelvis was performed using the standard protocol following bolus administration of intravenous contrast. CONTRAST:  58m OMNIPAQUE IOHEXOL 350 MG/ML SOLN COMPARISON:  03/14/2018 FINDINGS: Lower chest: Stable postoperative changes at the right lung base. No acute pulmonary findings or pleural effusion. Age advanced three-vessel coronary artery calcifications are noted. The heart is normal in size. No pericardial effusion. Hepatobiliary: No hepatic lesions or intrahepatic biliary dilatation. The gallbladder is surgically absent. No common bile duct dilatation. Pancreas: No mass, inflammation or ductal dilatation. Spleen: Normal size.  No focal lesions. Adrenals/Urinary Tract: Adrenal glands are normal. No renal lesions or hydroureteronephrosis. Stable renal cortical scarring changes involving the right kidney. The lower pole region of the left kidney demonstrates an area of decreased perfusion and a small amount adjacent perinephric interstitial change. Findings could be due to focal pyelonephritis. Recommend correlation with any left flank pain and recommend  correlation with urinalysis. The bladder is unremarkable. No bladder wall thickening or bladder calculi. Stomach/Bowel: The stomach, duodenum, small bowel and colon are unremarkable. No acute inflammatory changes, mass lesions or obstructive findings. The terminal ileum and appendix are normal. Vascular/Lymphatic: Minimal scattered atherosclerotic calcifications involving the aorta and iliac arteries but no aneurysm or dissection. The branch vessels are patent. The major venous structures are patent. No mesenteric or retroperitoneal mass or adenopathy. Reproductive: The uterus and ovaries are unremarkable. Other: Stable rim calcified structure near the dome of the bladder. Stable periumbilical abdominal wall hernia containing fat and also part of the transverse colon. Musculoskeletal: No significant bony findings. IMPRESSION: 1. CT findings suspicious for focal pyelonephritis involving the lower pole region of the left kidney. Recommend correlation with any left flank pain and recommend correlation with urinalysis. 2. No other significant abdominal/pelvic findings, mass lesions or adenopathy. 3. Status post cholecystectomy. No biliary dilatation. 4. Stable periumbilical abdominal wall hernia containing fat and part of the transverse colon. 5. Age advanced three-vessel coronary artery calcifications. Electronically Signed   By: PMarijo SanesM.D.   On: 02/26/2021 21:41    Procedures .Critical Care Performed by: SFransico Meadow PA-C Authorized by: SFransico Meadow PA-C   Critical care provider statement:    Critical care time (minutes):  45   Critical care start time:  02/26/2021 9:30 PM   Critical care end time:  02/26/2021 11:02 PM   Critical care time was exclusive of:  Separately  billable procedures and treating other patients   Critical care was necessary to treat or prevent imminent or life-threatening deterioration of the following conditions:  Cardiac failure and sepsis   Critical care was time  spent personally by me on the following activities:  Discussions with consultants, evaluation of patient's response to treatment, examination of patient, ordering and performing treatments and interventions, ordering and review of laboratory studies, ordering and review of radiographic studies, pulse oximetry, re-evaluation of patient's condition, obtaining history from patient or surrogate and review of old charts   Care discussed with: admitting provider     Medications Ordered in ED Medications  ceFEPIme (MAXIPIME) 2 g in sodium chloride 0.9 % 100 mL IVPB (2 g Intravenous New Bag/Given 02/26/21 2145)  metroNIDAZOLE (FLAGYL) IVPB 500 mg (500 mg Intravenous New Bag/Given 02/26/21 2143)  vancomycin (VANCOREADY) IVPB 1500 mg/300 mL (has no administration in time range)  lactated ringers bolus 1,000 mL (1,000 mLs Intravenous New Bag/Given 02/26/21 2142)    And  lactated ringers bolus 1,000 mL (has no administration in time range)    And  lactated ringers bolus 500 mL (has no administration in time range)  ceFEPIme (MAXIPIME) 2 g in sodium chloride 0.9 % 100 mL IVPB (has no administration in time range)  iohexol (OMNIPAQUE) 350 MG/ML injection 75 mL (75 mLs Intravenous Contrast Given 02/26/21 2129)    ED Course  I have reviewed the triage vital signs and the nursing notes.  Pertinent labs & imaging results that were available during my care of the patient were reviewed by me and considered in my medical decision making (see chart for details).    MDM Rules/Calculators/A&P                           MDM:  Labs and and Ct abdomen ordered.  Results reviewed and interpreted.  Pt counseled on results Sepsis ordered initiated, ct scan shows lower pole left kidney suspicious for pyelonephritis. Pt reports she feels much better after starting fluids   I spoke with Family Medicine who will see for admission Final Clinical Impression(s) / ED Diagnoses Final diagnoses:  Sepsis without acute organ  dysfunction, due to unspecified organism [facility])  Chronic a-fib [facility])    Rx / [place] Orders ED Discharge Orders     None        Sidney Ace 02/26/21 2304    Dorie Rank, MD 02/27/21 [phone]

## 2021-02-26 NOTE — ED Triage Notes (Signed)
Pt brought in by EMS for back pain and chest pain. Per crew, pt started c/o chest pain once she was in the truck and EKG showed a-fib with a rate of 190. Pt was given cardizem 10 mg x 2 and now heart rate is 114.

## 2021-02-26 NOTE — ED Notes (Signed)
Patient transported to CT 

## 2021-02-26 NOTE — Progress Notes (Signed)
[facility] Admission History and Physical Service Pager: [phone]  Patient name: Emily Farmer Medical record number: [id number] Date of birth: February 07, 1956 Age: 65 y.o. Gender: female  Primary Care Provider: Antony Blackbird, MD (Inactive) Consultants: None Code Status: Full Preferred Emergency Contact: Everlene Farrier - sister  Contact Information     Name Relation Home Work Mobile   Connole,Mildred Sister [phone]  [phone]       Chief Complaint: Chest pain, abdominal pain, vomiting, cough, diarrhea  Assessment and Plan: Emily Farmer is a 66 y.o. female presenting with sepsis likely due to pyelonephritis. PMH is significant for uncontrolled T2DM (last A1C >15.5 in 2021), chronic AF (on Eliquis), HFpEF (last echo 2021, EF 60-65%, grade 2 DD), HLD, IDA.   Sepsis criteria likely 2/2 pyelonephritis Patient called EMS for chest pain, abdominal pain, vomiting, cough, diarrhea. Sepsis protocol initiated in the ED based on fever (100.5 F), HR~120-140s, lactate of 3.0. Patient also hyperglycemic to 600. Patient given 2.5L LR as well as Vanc/Cefepime/Flagyl. Significant improvement subsequently, with reduction in symptoms. Lactate decreased to 2.6, glc of 473. CT A/P notable for focal decreased perfusion in the lower pole of the L kidney, concerning for pyelo. Patient complains of lower back pain and on exam exhibits L CVA tenderness. She denies recent dysuria and urinary frequency. Other potential causes of sepsis include PNA (negative CXR), an occult wound, and intra-abdominal infection (unlikely given otherwise unremarkable CT A/P). Will continue broad-spectrum ABX given patient's initially tenuous clinical status (poor vital signs, elevated lactate). Ucx not obtained prior to ABX, due to difficulties retrieving patient's urine -Admit to progressive under Dr. McDiarmid -Continue Vanc/cefepime/Flagyl -Consider narrowing ABX for pyelo and pending further clinical  improvement -Continue to trend lactate -In and out cath ordered for urinary retention -F/u UA once in and out cath is complete -F/u blood cultures -Up with assistance -Heart healthy diet -Bladder scans every 6 hours  Hyperglycemia with history of uncontrolled T2DM Last A1C >15.5 in 2021 with several others ~12-13 in that same year.  Glucose on admission of 600 (uncharted), subsequent glucose in the 400's. Home meds: Lantus 24U qhs, Humalog 10U TID, Victoza, metformin 250 mg QID. -Continue home glargine 24 units qAM -Sensitive SSI -Consider adding back home Novolog 10U TID with SSI -CBG monitoring QID -Check A1C tomorrow AM -S/p 10U novolog, CBG recheck in 1 hr -Hold home Victoza and metformin  Atrial Fibrillation with RVR Initial HR in the 130's-140's, rates have improved with fluid resuscitation to the 110's. Last HR of 106. Currently holding metop succinate due to hypotension. Expecting BP and HR to improve with fluid resuscitation.  -Start metop succinate back at 10 AM, 9/14 -Continue to monitor HR, which is improving with fluid resuscitation -Cardiac monitoring -Continue home Eliquis 5 mg BID  Urinary retention Bladder scan with report of > 700 mL. In and out pending. Purewick in place.  Due to difficulties in retrieving urine and the severity of the patient's sepsis antibiotics were started prior to retrieving urine cultures. -UA -Ucx (will be collected after ABX, unfortunately) -Bladder scans q6hrs  HFpEF Last echo 2021, EF 60-65%, grade 2 DD. S/p 2.5L LR, 500 cc bolus running.  No crackles on lung exam with chest x-ray showing no active cardiopulmonary disease.  Patient with no pitting edema of the lower extremities at the time of admission.  Due to patient's sepsis patient is in need of significant fluid resuscitation and has dry mucous membranes, however we do have to be cautious with her history  of heart failure. -Continue to monitor volume status -Use IVF with  caution -Fluids as above  Chest pain Patient complaining of cp on admission. Resolved by time of our assessment. Flat troponins (17-31-31), no need to recheck. Likely demand ischemia. -Cardiac monitoring  AKI Baseline creatinine of 1-1.1. Presented with cr of 1.46. Has improved to 1.30 with 2.5 L of LR bolus. Given an additional 500 cc LR bolus currently.  Likely due to patient's sepsis and anticipated to continue to improve with fluid resuscitation. -AM BMP -Fluids as above  Hx of iron deficiency anemia Hgb of 12.4 on admission.  -Continue home Ferrous sulfate 325 mg BID -A.m. labs  HLD Continue home atorvastatin 40 mg and Zetia 10 mg  FEN/GI: heart healthy/carb modified diet Prophylaxis: Eliquis  Disposition: Progressive  History of Present Illness:  Emily Farmer is a 65 y.o. female presenting with sepsis. She states she felt a shivering sensation and thought her sugar must be high and so she came to the ED. She was also experiencing chest pain, abdominal pain, and vomiting.  She states her chest and abdominal pain have improved. She was experiencing new lower back pain on her left side that she hasn't had before.   Tobacco - never Alcohol - none Drugs - none  Review Of Systems: Per HPI with the following additions:   Review of Systems  Constitutional:  Positive for chills.  Respiratory:  Negative for shortness of breath.   Cardiovascular:  Positive for chest pain and palpitations.  Gastrointestinal:  Positive for vomiting. Negative for abdominal pain and anal bleeding.  Genitourinary:  Negative for dysuria and frequency.  Musculoskeletal:  Positive for back pain.  Neurological:  Negative for numbness and headaches.    Patient Active Problem List   Diagnosis Date Noted   Sepsis ([place]) 02/27/2021   Secondary hypercoagulable state ([place]) 05/08/2020   Type 2 diabetes mellitus with diabetic polyneuropathy, with long-term current use of insulin ([place]) 09/23/2019   Type 2  diabetes mellitus with hyperglycemia, with long-term current use of insulin ([place]) 09/23/2019   Dyslipidemia 09/23/2019   Toenail fungus 07/19/2018   Depression 07/19/2018   Iron deficiency anemia due to chronic blood loss 07/19/2018   Gastritis and duodenitis 07/19/2018   Atrial fibrillation with slow ventricular response ([place]) 01/28/2018   Hyponatremia 01/28/2018   Chronic diastolic CHF (congestive heart failure) ([place]) 01/28/2018   Mixed hyperlipidemia 06/01/2017   Paroxysmal A-fib ([place]) 10/04/2016   Essential hypertension 02/15/2013   Hypertriglyceridemia 02/15/2013   Diabetes mellitus type 2 in obese ([place]) 02/15/2013   Coronary atherosclerosis seen on CT 02/15/2013    Past Medical History: Past Medical History:  Diagnosis Date   Arthritis    Atrial fibrillation ([place])    a. 09/2016 in setting of pancreatitis;  b. 09/2016 Echo: EF 65-60%, no rwma, Gr2 DD, mild MR, triv TR, PASP 40mHg;  CHA2DS2VASc = 4-->Eliquis 519mBID. c. Recurred 06/2018 in setting of GIB.   Chronic diastolic CHF (congestive heart failure) ([facility]8/15/2019   Diabetes mellitus    GI bleeding 06/2018   Gout    Hyperlipidemia    Hypertension    Hypertriglyceridemia    Pancreatitis    a. 09/2016 - Triglycerides 1,392 on admission.    Past Surgical History: Past Surgical History:  Procedure Laterality Date   BIOPSY  06/20/2018   Procedure: BIOPSY;  Surgeon: MaClarene EssexMD;  [facility]: [facility];;   CHOLECYSTECTOMY N/A 01/04/2014   Procedure: LAPAROSCOPIC CHOLECYSTECTOMY WITH INTRAOPERATIVE CHOLANGIOGRAM;  Surgeon: Ralene Ok, MD;  Location: [facility];  Service: General;  Laterality: N/A;   COLONOSCOPY WITH PROPOFOL N/A 06/21/2018   Procedure: COLONOSCOPY WITH PROPOFOL;  Surgeon: Ronald Lobo, MD;  Location: [facility];  Service: Endoscopy;  Laterality: N/A;   ESOPHAGOGASTRODUODENOSCOPY (EGD) WITH PROPOFOL N/A 06/20/2018   Procedure: ESOPHAGOGASTRODUODENOSCOPY (EGD) WITH PROPOFOL;  Surgeon:  Clarene Essex, MD;  Location: [facility];  Service: Endoscopy;  Laterality: N/A;   LUNG SURGERY      Social History: Social History   Tobacco Use   Smoking status: Never   Smokeless tobacco: Never  Vaping Use   Vaping Use: Never used  Substance Use Topics   Alcohol use: No   Drug use: No   Additional social history:  denies any drug use Please also refer to relevant sections of EMR.  Family History: Family History  Problem Relation Age of Onset   Heart attack Mother    Heart attack Father    Diabetes Sister    High blood pressure Neg Hx    High Cholesterol Neg Hx     Allergies and Medications: No Known Allergies No current facility-administered medications on file prior to encounter.   Current Outpatient Medications on File Prior to Encounter  Medication Sig Dispense Refill   acetaminophen (TYLENOL) 325 MG tablet Take 2 tablets (650 mg total) by mouth every 6 (six) hours as needed for mild pain (or Fever >/= 101).     apixaban (ELIQUIS) 5 MG TABS tablet Take 1 tablet (5 mg total) by mouth 2 (two) times daily. 60 tablet 0   atorvastatin (LIPITOR) 40 MG tablet Take 1 tablet (40 mg total) by mouth daily. 90 tablet 3   Blood Glucose Monitoring Suppl (TRUE METRIX METER) w/Device KIT Use to monitor blood sugar as directed (Patient taking differently: 1 each by Other route See admin instructions. Use to monitor blood sugar as directed) 1 kit 0   ezetimibe (ZETIA) 10 MG tablet Take 1 tablet (10 mg total) by mouth daily. To help lower lipids 90 tablet 3   ferrous sulfate 325 (65 FE) MG tablet TAKE 1 TABLET (325 MG TOTAL) BY MOUTH 2 (TWO) TIMES DAILY WITH A MEAL. (Patient taking differently: Take 325 mg by mouth 2 (two) times daily with a meal.) 60 tablet 3   glucose blood test strip USE AS INSTRUCTED TO CHECK BLOOD SUGARS 3 TIMES DAILY (Patient taking differently: 1 each by Other route See admin instructions. USE AS INSTRUCTED TO CHECK BLOOD SUGARS 3 TIMES DAILY) 100 each 11    Insulin Glargine (BASAGLAR KWIKPEN) 100 UNIT/ML INJECT 28 UNITS INTO THE SKIN AT BEDTIME. (Patient taking differently: Inject 28 Units into the skin at bedtime.) 30 mL 6   insulin glargine (LANTUS) 100 UNIT/ML injection Inject 24 Units into the skin at bedtime.     insulin lispro (HUMALOG KWIKPEN) 100 UNIT/ML KwikPen Inject 0.1 mLs (10 Units total) into the skin 3 (three) times daily. 30 mL 11   Insulin Syringe-Needle U-100 (TRUEPLUS INSULIN SYRINGE) 31G X 5/16" 0.3 ML MISC Use to inject insulin daily. (Patient taking differently: 1 each by Other route See admin instructions. Use to inject insulin daily.) 100 each 11   liraglutide (VICTOZA) 18 MG/3ML SOPN INJECT 0.6 MG INTO THE SKIN DAILY. (Patient taking differently: Inject 18 mg into the skin daily.) 3 mL 6   metFORMIN (GLUCOPHAGE) 500 MG tablet Take 0.5 tablets (250 mg total) by mouth 4 (four) times daily. 60 tablet 2   metoprolol succinate (TOPROL-XL) 25  MG 24 hr tablet TAKE 1 TABLET (25 MG TOTAL) BY MOUTH IN THE MORNING AND AT BEDTIME. (Patient not taking: No sig reported) 180 tablet 3   metoprolol succinate (TOPROL-XL) 25 MG 24 hr tablet Take 1 tablet (25 mg total) by mouth daily. Take 2 tablets, 50 mg Toprol-XL daily.  If your heart rate is less than 60 and your blood pressure is lower than 110/60, go back to your once daily tablet. (Patient taking differently: Take 25 mg by mouth 2 (two) times daily.) 30 tablet 0   Multiple Vitamins-Minerals (CENTRUM SILVER PO) Take 1 tablet by mouth daily.     mupirocin ointment (BACTROBAN) 2 % Place 1 application into the nose 2 (two) times daily. 30 g 0   TRUEplus Lancets 28G MISC USE TO CHECK BLOOD SUGAR 3 TIMES DAILY (Patient taking differently: 1 each by Other route See admin instructions. USE TO CHECK BLOOD SUGAR 3 TIMES DAILY) 100 each 11    Objective: BP 111/74   Pulse (!) 102   Temp 98 F (36.7 C) (Oral)   Resp 17   Ht '5\' 3"'  (1.6 m)   Wt 77.1 kg   SpO2 100%   BMI 30.11 kg/m  Physical  Exam Vitals reviewed.  HENT:     Mouth/Throat:     Mouth: Mucous membranes are dry.  Cardiovascular:     Rate and Rhythm: Tachycardia present. Rhythm irregular.     Heart sounds: Normal heart sounds. No murmur heard. Pulmonary:     Effort: Pulmonary effort is normal. No respiratory distress.     Breath sounds: Normal breath sounds. No wheezing, rhonchi or rales.  Abdominal:     General: Bowel sounds are normal.     Tenderness: There is no abdominal tenderness.  Skin:    Comments: Poor peripheral pulses  Neurological:     Mental Status: She is alert and oriented to person, place, and time.     Labs and Imaging: CBC BMET  Recent Labs  Lab 02/26/21 1915 02/26/21 1924  WBC 5.7  --   HGB 12.4 12.6  12.2  HCT 37.1 37.0  36.0  PLT 182  --    Recent Labs  Lab 02/26/21 1915 02/26/21 1924  NA 133* 133*  133*  K 3.8 3.8  3.8  CL 100 104  CO2 17*  --   BUN 29* 28*  CREATININE 1.46* 1.30*  GLUCOSE 473* 499*  CALCIUM 8.7*  --      EKG: AF with RVR   Corky Sox PGY-1, Psychiatry FPTS Intern pager: [phone], text pages welcome  Upper Level Addendum:  I have seen and evaluated this patient along with Dr. Alvie Heidelberg and reviewed the above note, making necessary revisions as appropriate.  I agree with the medical decision making and physical exam as noted above.  Lurline Del, DO PGY-3 [facility]

## 2021-02-26 NOTE — ED Notes (Signed)
Pt repositioned in bed. Pt says she's feeling better. Denies pain. Purewick placed. Pt denies further needs.

## 2021-02-26 NOTE — ED Notes (Signed)
Pt here with CP, nausea and shortness of breath that started around 1700 tonight. Was watching a movie and started "to shake". Pt said she new she was in A fib because she's had this problem before. Pt denies shortness of breath and nausea now, but is still having CP.

## 2021-02-26 NOTE — Progress Notes (Signed)
Pharmacy Antibiotic Note  Emily Farmer is a 65 y.o. female admitted on 02/26/2021 with sepsis.  Pharmacy has been consulted for cefepime and vancomycin dosing.  WBC 5.7, Tmax 100.53F SCr 1.3 - baseline ~1  Plan: Cefepime 2 gm every 12 hours Give vancomycin 1500 mg IV x1 Variable vancomycin dosing per renal function Monitor renal function, CBC, cultures/sensitivities, LOT and de-escalate as able  Height: 5\' 3"  (160 cm) Weight: 77.1 kg (170 lb) IBW/kg (Calculated) : 52.4  Temp (24hrs), Avg:100.5 F (38.1 C), Min:100.5 F (38.1 C), Max:100.5 F (38.1 C)  Recent Labs  Lab 02/26/21 1915 02/26/21 1924  WBC 5.7  --   CREATININE  --  1.30*  LATICACIDVEN 3.0*  --     Estimated Creatinine Clearance: 43.6 mL/min (A) (by C-G formula based on SCr of 1.3 mg/dL (H)).    No Known Allergies  Antimicrobials this admission: Cefepime 9/13 >> Vancomycin 9/13 >>  Dose adjustments this admission: Cefepime 2gm q12h for CrCl 43.91mL/min  Microbiology results: 9/13 BCx: pending   Thank you for allowing pharmacy to be a part of this patient's care.  10/13, PharmD PGY1 Pharmacy Resident 02/26/2021  8:49 PM  Please check [email or web address] for unit-specific pharmacy phone numbers.

## 2021-02-26 NOTE — ED Provider Notes (Signed)
Emergency Medicine Provider Triage Evaluation Note  Emily Farmer , a 65 y.o. female  was evaluated in triage. Per EMS, pt initially called out for back pain. On arrival she was found to by hyperglycemic (BS reading >600), tachypneic, and having nv. She further developed cp in the ambulance and was noted to be in afib with rvr. She was given 10 of dilt and hr improved to 140s. She was given ivf with 1L total and given another 10 of dilt. Sats were also in the 80s en route and she was placed on 2L.   She reports she has been feeling poorly x2 days. Reports cough, nv, epigastric abd pain and sob. Appears pale but denies hematochezia or melena. Is anticoagulated.  Review of Systems  Positive: Chest pain, sob, cough, fever, abd pain, nv Negative: Melena, hematochezia  Physical Exam  BP (!) 117/107 (BP Location: Right Arm)   Pulse (!) 114   Temp (!) 100.5 F (38.1 C) (Oral)   Resp (!) 26   SpO2 91%  Gen:   Awake, no distress   Resp:  Normal effort  MSK:   Moves extremities without difficulty  Other:  Pale conjunctiva, tachycardia, epigastric abd ttp noted  Medical Decision Making  Medically screening exam initiated at 7:07 PM.  Appropriate orders placed.  Emily Farmer was informed that the remainder of the evaluation will be completed by another provider, this initial triage assessment does not replace that evaluation, and the importance of remaining in the ED until their evaluation is complete.  Pt is toxic appearing and in acute distress. Notified charge nurse that pt will need to have the next room.    Samson Frederic, Germaine Shenker S, PA-C 02/26/21 1907    Franne Forts, DO 02/26/21 2359

## 2021-02-26 NOTE — ED Notes (Signed)
Pt appears to be asleep and resting comfortably in the bed.

## 2021-02-26 NOTE — ED Notes (Signed)
Admitting team at bedside.

## 2021-02-27 ENCOUNTER — Other Ambulatory Visit: Payer: Self-pay

## 2021-02-27 DIAGNOSIS — I959 Hypotension, unspecified: Secondary | ICD-10-CM | POA: Diagnosis present

## 2021-02-27 DIAGNOSIS — I10 Essential (primary) hypertension: Secondary | ICD-10-CM

## 2021-02-27 DIAGNOSIS — G9340 Encephalopathy, unspecified: Secondary | ICD-10-CM

## 2021-02-27 DIAGNOSIS — R6521 Severe sepsis with septic shock: Secondary | ICD-10-CM

## 2021-02-27 DIAGNOSIS — A419 Sepsis, unspecified organism: Secondary | ICD-10-CM | POA: Diagnosis present

## 2021-02-27 DIAGNOSIS — I482 Chronic atrial fibrillation, unspecified: Secondary | ICD-10-CM

## 2021-02-27 DIAGNOSIS — R6520 Severe sepsis without septic shock: Secondary | ICD-10-CM

## 2021-02-27 DIAGNOSIS — I4891 Unspecified atrial fibrillation: Secondary | ICD-10-CM | POA: Diagnosis present

## 2021-02-27 DIAGNOSIS — N10 Acute tubulo-interstitial nephritis: Secondary | ICD-10-CM

## 2021-02-27 LAB — BLOOD CULTURE ID PANEL (REFLEXED) - BCID2

## 2021-02-27 LAB — URINALYSIS, ROUTINE W REFLEX MICROSCOPIC
Bilirubin Urine: NEGATIVE
Glucose, UA: >=500 mg/dL — AB
Ketones, ur: NEGATIVE mg/dL
Leukocytes,Ua: NEGATIVE
Nitrite: POSITIVE — AB
Protein, ur: NEGATIVE mg/dL
Specific Gravity, Urine: 1.01 (ref 1.005–1.030)
pH: 5.5 (ref 5.0–8.0)

## 2021-02-27 LAB — CBG MONITORING, ED
Glucose-Capillary: 483 mg/dL — ABNORMAL HIGH (ref 70–99)
Glucose-Capillary: 461 mg/dL — ABNORMAL HIGH (ref 70–99)
Glucose-Capillary: 409 mg/dL — ABNORMAL HIGH (ref 70–99)
Glucose-Capillary: 230 mg/dL — ABNORMAL HIGH (ref 70–99)
Glucose-Capillary: 104 mg/dL — ABNORMAL HIGH (ref 70–99)
Glucose-Capillary: 133 mg/dL — ABNORMAL HIGH (ref 70–99)
Glucose-Capillary: 139 mg/dL — ABNORMAL HIGH (ref 70–99)

## 2021-02-27 LAB — COMPREHENSIVE METABOLIC PANEL
ALT: 14 U/L (ref 0–44)
AST: 20 U/L (ref 15–41)
Albumin: 2.8 g/dL — ABNORMAL LOW (ref 3.5–5.0)
Alkaline Phosphatase: 90 U/L (ref 38–126)
Anion gap: 12 (ref 5–15)
BUN: 27 mg/dL — ABNORMAL HIGH (ref 8–23)
CO2: 18 mmol/L — ABNORMAL LOW (ref 22–32)
Calcium: 8.2 mg/dL — ABNORMAL LOW (ref 8.9–10.3)
Chloride: 101 mmol/L (ref 98–111)
Creatinine, Ser: 1.37 mg/dL — ABNORMAL HIGH (ref 0.44–1.00)
GFR, Estimated: 43 mL/min — ABNORMAL LOW (ref >60)
Glucose, Bld: 469 mg/dL — ABNORMAL HIGH (ref 70–99)
Potassium: 3.6 mmol/L (ref 3.5–5.1)
Sodium: 131 mmol/L — ABNORMAL LOW (ref 135–145)
Total Bilirubin: 1.1 mg/dL (ref 0.3–1.2)
Total Protein: 5.8 g/dL — ABNORMAL LOW (ref 6.5–8.1)

## 2021-02-27 LAB — URINALYSIS, MICROSCOPIC (REFLEX)

## 2021-02-27 LAB — CBC
HCT: 34.2 % — ABNORMAL LOW (ref 36.0–46.0)
Hemoglobin: 11.3 g/dL — ABNORMAL LOW (ref 12.0–15.0)
MCH: 29.7 pg (ref 26.0–34.0)
MCHC: 33 g/dL (ref 30.0–36.0)
MCV: 89.8 fL (ref 80.0–100.0)
Platelets: 144 10*3/uL — ABNORMAL LOW (ref 150–400)
RBC: 3.81 MIL/uL — ABNORMAL LOW (ref 3.87–5.11)
RDW: 13.2 % (ref 11.5–15.5)
WBC: 15.8 10*3/uL — ABNORMAL HIGH (ref 4.0–10.5)
nRBC: 0 % (ref 0.0–0.2)

## 2021-02-27 LAB — GLUCOSE, CAPILLARY: Glucose-Capillary: 214 mg/dL — ABNORMAL HIGH (ref 70–99)

## 2021-02-27 LAB — HEMOGLOBIN A1C
Hgb A1c MFr Bld: 12.8 % — ABNORMAL HIGH (ref 4.8–5.6)
Mean Plasma Glucose: 320.66 mg/dL

## 2021-02-27 LAB — LACTIC ACID, PLASMA: Lactic Acid, Venous: 2.2 mmol/L (ref 0.5–1.9)

## 2021-02-27 LAB — MAGNESIUM: Magnesium: 1.2 mg/dL — ABNORMAL LOW (ref 1.7–2.4)

## 2021-02-27 LAB — TROPONIN I (HIGH SENSITIVITY): Troponin I (High Sensitivity): 31 ng/L — ABNORMAL HIGH (ref <18)

## 2021-02-27 LAB — HIV ANTIBODY (ROUTINE TESTING W REFLEX): HIV Screen 4th Generation wRfx: NONREACTIVE

## 2021-02-27 MED ORDER — LACTATED RINGERS IV BOLUS
500.0000 mL | Freq: Once | INTRAVENOUS | Status: AC
  Administered 2021-02-27: 500 mL via INTRAVENOUS

## 2021-02-27 MED ORDER — INSULIN ASPART 100 UNIT/ML IJ SOLN
12.0000 [IU] | Freq: Once | INTRAMUSCULAR | Status: AC
  Administered 2021-02-27: 12 [IU] via SUBCUTANEOUS

## 2021-02-27 MED ORDER — INSULIN ASPART 100 UNIT/ML IJ SOLN
0.0000 [IU] | Freq: Three times a day (TID) | INTRAMUSCULAR | Status: DC
  Administered 2021-02-27 (×2): 1 [IU] via SUBCUTANEOUS
  Administered 2021-02-28 (×2): 3 [IU] via SUBCUTANEOUS
  Administered 2021-02-28: 2 [IU] via SUBCUTANEOUS
  Administered 2021-03-01: 3 [IU] via SUBCUTANEOUS

## 2021-02-27 MED ORDER — VANCOMYCIN HCL 1250 MG/250ML IV SOLN: 1250.0000 mg | INTRAVENOUS | Status: DC

## 2021-02-27 MED ORDER — EZETIMIBE 10 MG PO TABS
10.0000 mg | ORAL_TABLET | Freq: Every day | ORAL | Status: DC
Start: 2021-02-27 — End: 2021-03-03
  Administered 2021-02-27 – 2021-03-03 (×5): 10 mg via ORAL
  Filled 2021-02-27 (×5): qty 1

## 2021-02-27 MED ORDER — METOPROLOL SUCCINATE ER 25 MG PO TB24
25.0000 mg | ORAL_TABLET | Freq: Every day | ORAL | 1 refills | Status: DC
  Filled 2021-02-27: qty 180, 180d supply, fill #0

## 2021-02-27 MED ORDER — METOPROLOL SUCCINATE ER 25 MG PO TB24: 25.0000 mg | ORAL_TABLET | Freq: Two times a day (BID) | ORAL | Status: DC

## 2021-02-27 MED ORDER — ATORVASTATIN CALCIUM 40 MG PO TABS
40.0000 mg | ORAL_TABLET | Freq: Every day | ORAL | Status: DC
  Administered 2021-02-27 – 2021-03-03 (×5): 40 mg via ORAL
  Filled 2021-02-27 (×5): qty 1

## 2021-02-27 MED ORDER — INSULIN ASPART 100 UNIT/ML IJ SOLN
10.0000 [IU] | Freq: Once | INTRAMUSCULAR | Status: AC
  Administered 2021-02-27: 10 [IU] via SUBCUTANEOUS

## 2021-02-27 MED ORDER — INSULIN GLARGINE-YFGN 100 UNIT/ML ~~LOC~~ SOLN
24.0000 [IU] | Freq: Every day | SUBCUTANEOUS | Status: DC
  Administered 2021-02-27 – 2021-03-02 (×4): 24 [IU] via SUBCUTANEOUS
  Filled 2021-02-27 (×5): qty 0.24

## 2021-02-27 MED ORDER — INSULIN ASPART 100 UNIT/ML IJ SOLN
9.0000 [IU] | Freq: Once | INTRAMUSCULAR | Status: AC
  Administered 2021-02-27: 9 [IU] via SUBCUTANEOUS

## 2021-02-27 MED ORDER — FERROUS SULFATE 325 (65 FE) MG PO TABS
325.0000 mg | ORAL_TABLET | Freq: Two times a day (BID) | ORAL | Status: DC
  Administered 2021-02-27: 325 mg via ORAL
  Filled 2021-02-27: qty 1

## 2021-02-27 MED ORDER — MAGNESIUM SULFATE 2 GM/50ML IV SOLN
2.0000 g | Freq: Once | INTRAVENOUS | Status: AC
  Administered 2021-02-28: 2 g via INTRAVENOUS
  Filled 2021-02-27: qty 50

## 2021-02-27 MED ORDER — METOPROLOL TARTRATE 25 MG PO TABS
25.0000 mg | ORAL_TABLET | Freq: Two times a day (BID) | ORAL | Status: DC
  Administered 2021-02-27 – 2021-02-28 (×3): 25 mg via ORAL
  Filled 2021-02-27 (×3): qty 1

## 2021-02-27 MED ORDER — FERROUS SULFATE 325 (65 FE) MG PO TABS
325.0000 mg | ORAL_TABLET | Freq: Every day | ORAL | Status: DC
  Administered 2021-02-28 – 2021-03-03 (×4): 325 mg via ORAL
  Filled 2021-02-27 (×4): qty 1

## 2021-02-27 MED ORDER — SODIUM CHLORIDE 0.9 % IV SOLN
INTRAVENOUS | Status: DC | PRN
  Administered 2021-02-27: 250 mL via INTRAVENOUS

## 2021-02-27 MED ORDER — APIXABAN 5 MG PO TABS
5.0000 mg | ORAL_TABLET | Freq: Two times a day (BID) | ORAL | Status: DC
  Administered 2021-02-27 – 2021-03-03 (×9): 5 mg via ORAL
  Filled 2021-02-27 (×9): qty 1

## 2021-02-27 MED ORDER — LACTATED RINGERS IV SOLN: INTRAVENOUS | Status: DC

## 2021-02-27 MED ORDER — INSULIN LISPRO (1 UNIT DIAL) 100 UNIT/ML (KWIKPEN): 10.0000 [IU] | PEN_INJECTOR | Freq: Three times a day (TID) | SUBCUTANEOUS | Status: DC

## 2021-02-27 MED ORDER — VANCOMYCIN VARIABLE DOSE PER UNSTABLE RENAL FUNCTION (PHARMACIST DOSING): Status: DC

## 2021-02-27 MED ORDER — METOPROLOL TARTRATE 5 MG/5ML IV SOLN
5.0000 mg | Freq: Once | INTRAVENOUS | Status: AC
  Administered 2021-02-27: 5 mg via INTRAVENOUS
  Filled 2021-02-27: qty 5

## 2021-02-27 MED ORDER — SODIUM CHLORIDE 0.9 % IV SOLN
2.0000 g | INTRAVENOUS | Status: DC
  Administered 2021-02-27 – 2021-03-01 (×3): 2 g via INTRAVENOUS
  Filled 2021-02-27 (×3): qty 20

## 2021-02-27 MED ORDER — ACETAMINOPHEN 325 MG PO TABS
650.0000 mg | ORAL_TABLET | Freq: Four times a day (QID) | ORAL | Status: DC | PRN
  Administered 2021-02-27 – 2021-03-03 (×8): 650 mg via ORAL
  Filled 2021-02-27 (×8): qty 2

## 2021-02-27 NOTE — Progress Notes (Addendum)
Family Medicine Teaching Service Daily Progress Note Intern Pager: [phone]  Patient name: Emily Farmer Medical record number: [id number] Date of birth: 1955/08/01 Age: 65 y.o. Gender: female  Primary Care Provider: Antony Blackbird, MD (Inactive) Consultants: Cardio Code Status: Full   Pt Overview and Major Events to Date:  02/26/21 Admitted to FPTS   Assessment and Plan: Emily Farmer is a 65 yo female presenting with sepsis likely 2/2 to pyelonephritis. PMHx if significant for uncontrolled T2DM (last A1C >15.5 in 2021), chronic AF (On Eliquis), HFpEF (last echo 2021 with EF 60-65%, Grade 2 DD), HLD, IDA  Sepsis criteria met likely 2/2 pyelonephritis   Patient reports that today she feels a bit better than she did on admission.  Sepsis protocol was initiated in the ED for fever to 100.5, HR 120s-140s, and lactate of 3. Currently, temperature is 97.9, HR to 134, Lactate 3>2.2. CT abdomen and pelvis showed focal decreased perfusion on the lower pole of the left kidney, with concern for pyelo. Urinary symptoms include: None. Pt denies any increased frequency, dysuria. Potential causes of infection include PNA (negative CXR), occult wounds (no wounds seen on exam), and intra-abdominal pathology which was ruled as unlikely 2/2 to CT abdominal and pelvis. Updated Labs: Glucose 469, Creatinine 1.37, GFR 43, WBC 15.8 from 5.7, LA 2.6>2.2. UA: hazy urine, >500 glucose, positive nitrites. Patient has been given a dose of Vanc cefepime and Flagyl broad coverage given poor clinical status. Pt has also been given 3.5L of LR boluses. Pt grew klebsiella on urine culture.  -Continue with CTX -Will de-escalate by d/c abx other than CTX  -Additional 533m bolus LR was given  -VS per floor protocol  -Bladder scans q6 hours  -Heart Healthy diet  -Up with assistance  -Lactate is trending down  -Tylenol 6545mq6hr PRN -Urine culture obtained after abx was started, monitor blood cultures   -Mag ordered. Monitor  CBCs and CMP  Hyperglycemia with history of uncontrolled T2DM Last A1C 15.5 in 2021 with multiple within a similar range in the year of 2021. Current HA1C 12.8. Glucose on admission 600 (uncharted). Home medications include: Lantus 24U qhs, Humalog 10U TID, Victoza, metformin 25037mID. CBGS have been downtrending appropriately 461>409>230>104. 31U SSI used overnight.  -Currently on sSSI -Semglee 24 U daily  -Change CBGs to q4hr to monitor for hypoglycemia given drop   Afib with RVR  Initially HR was in 130s-140s that improved with fluids. Pt had a HR into the 180s overnight that required Metop tartrate 5mg58m at 520 and then Metop tartrate 25mg26mat 550. Today, the patient's heart rate is 110-115. Bps have been soft: 85/61. -Cardio to see patient, appreciate recommendations  -Contingency plan of amiodarone if tachycardia persists  -Repeat echo (discussed below)  Urinary Retention  Bladder scan after admission showed >700mL 23mpt received one in and out cath. When we were in with the patient, she had not urinated and had ~650 mL on bladder scan. A second in and out cath was performed and had about 750mL o48mPure wick is in place  -Continue q6hr bladder scans -If she is still retaining, will place foley   HFpEF Last echo 2021 showed EF of 60-65%, Grade 2DD. S/p 3L LR. No overt signs of volume overload on exam. Pt has not been started on maintenance fluids.  -IVF as appropriate -Maintenance fluids at 75mL/hr65mpeat Echo  -Daily weights  -Strict I&Os  Chest Pain, resolved  Trops trended flat and EKG showed AFib with RVR.  Chest pain from admission has since resolved.   AKI  Baseline creatinine 1-1.1. Initial creatinine 1.46>1.37. Pt has received 4L of LR boluses. Pt has hx of HFpEF as stated above, but could benefit from additional fluids. No maintenance currently ordered.  -Regular BMPs -Fluids if appropriate  -Maintenance of 37m/hr  Hx of IDA Hgb 12.4>11.3. Home meds: Ferrous  sulfate 325 mg BID -Continue with home medication  -AM Labs   HLD Lipids Panel 06/05/21: Total 214, Triglycerides 374, LDL 110. Home meds: Atorvastatin 40 mg and Zetia 169m  -Continue home medication   FEN/GI: Heart healthy/carb modified diet  PPx: Eliquis  Dispo:Home pending clinical improvement . Barriers include clinical improvement.   Subjective:  Pt reports that she has improved this morning. She no longer is having chills, and she denies any urinary symptoms. Pt reports that her blood sugars always run high.   Objective: Temp:  [97.9 F (36.6 C)-100.5 F (38.1 C)] 97.9 F (36.6 C) (09/14 0500) Pulse Rate:  [102-141] 109 (09/14 1030) Resp:  [17-29] 23 (09/14 1030) BP: (73-166)/(44-107) 84/55 (09/14 1030) SpO2:  [88 %-100 %] 95 % (09/14 1030) Weight:  [77.1 kg] 77.1 kg (09/13 1921) Physical Exam Vitals reviewed.  Constitutional:      General: She is not in acute distress.    Appearance: She is well-developed. She is not ill-appearing.  Cardiovascular:     Rate and Rhythm: Tachycardia present. Rhythm irregular.  Pulmonary:     Effort: Pulmonary effort is normal. No respiratory distress.  Abdominal:     General: Bowel sounds are normal.     Tenderness: There is no abdominal tenderness.     Comments: Bladder fullness  Skin:    General: Skin is warm.     Comments: No evidence of occult wounds  Neurological:     Mental Status: She is alert.  Psychiatric:        Mood and Affect: Mood normal.        Behavior: Behavior normal.    Laboratory: Recent Labs  Lab 02/26/21 1915 02/26/21 1924 02/27/21 0253  WBC 5.7  --  15.8*  HGB 12.4 12.6  12.2 11.3*  HCT 37.1 37.0  36.0 34.2*  PLT 182  --  144*   Recent Labs  Lab 02/26/21 1915 02/26/21 1924 02/27/21 0253  NA 133* 133*  133* 131*  K 3.8 3.8  3.8 3.6  CL 100 104 101  CO2 17*  --  18*  BUN 29* 28* 27*  CREATININE 1.46* 1.30* 1.37*  CALCIUM 8.7*  --  8.2*  PROT 6.4*  --  5.8*  BILITOT 1.0  --  1.1   ALKPHOS 105  --  90  ALT 12  --  14  AST 14*  --  20  GLUCOSE 473* 499* 469*     Imaging/Diagnostic Tests: CT A/P 02/26/21: IMPRESSION: 1. CT findings suspicious for focal pyelonephritis involving the lower pole region of the left kidney. Recommend correlation with any left flank pain and recommend correlation with urinalysis. 2. No other significant abdominal/pelvic findings, mass lesions or adenopathy. 3. Status post cholecystectomy. No biliary dilatation. 4. Stable periumbilical abdominal wall hernia containing fat and part of the transverse colon. 5. Age advanced three-vessel coronary artery calcifications.  MaErskine EmeryMD 02/27/2021, 12:31 PM PGY-1, [facility] pager: [phone] pages welcome

## 2021-02-27 NOTE — ED Notes (Signed)
Pt continues to sleep. NAD noted. 

## 2021-02-27 NOTE — H&P (Addendum)
[facility] Admission History and Physical Service Pager: [phone]   Patient name: Emily Farmer           Medical record number: [id number] Date of birth: Jun 02, 1956      Age: 65 y.o.    Gender: female   Primary Care Provider: Antony Blackbird, MD (Inactive) Consultants: None Code Status: Full Preferred Emergency Contact: Everlene Farrier - sister  Contact Information       Name Relation Home Work Mobile    Gibeau,Mildred Sister [phone]   [phone]           Chief Complaint: Chest pain, abdominal pain, vomiting, cough, diarrhea   Assessment and Plan: Emily Farmer is a 65 y.o. female presenting with sepsis likely due to pyelonephritis. PMH is significant for uncontrolled T2DM (last A1C >15.5 in 2021), chronic AF (on Eliquis), HFpEF (last echo 2021, EF 60-65%, grade 2 DD), HLD, IDA.    Sepsis criteria likely 2/2 pyelonephritis Patient called EMS for chest pain, abdominal pain, vomiting, cough, diarrhea. Sepsis protocol initiated in the ED based on fever (100.5 F), HR~120-140s, lactate of 3.0. Patient also hyperglycemic to 600. Patient given 2.5L LR as well as Vanc/Cefepime/Flagyl. Significant improvement subsequently, with reduction in symptoms. Lactate decreased to 2.6, glc of 473. CT A/P notable for focal decreased perfusion in the lower pole of the L kidney, concerning for pyelo. Patient complains of lower back pain and on exam exhibits L CVA tenderness. She denies recent dysuria and urinary frequency. Other potential causes of sepsis include PNA (negative CXR), an occult wound, and intra-abdominal infection (unlikely given otherwise unremarkable CT A/P). Will continue broad-spectrum ABX given patient's initially tenuous clinical status (poor vital signs, elevated lactate). Ucx not obtained prior to ABX, due to difficulties retrieving patient's urine -Admit to progressive under Dr. McDiarmid -Continue Vanc/cefepime/Flagyl -Consider narrowing ABX for pyelo and  pending further clinical improvement -Continue to trend lactate -In and out cath ordered for urinary retention -F/u UA once in and out cath is complete -F/u blood cultures -Up with assistance -Heart healthy diet -Bladder scans every 6 hours   Hyperglycemia with history of uncontrolled T2DM Last A1C >15.5 in 2021 with several others ~12-13 in that same year.  Glucose on admission of 600 (uncharted), subsequent glucose in the 400's. Home meds: Lantus 24U qhs, Humalog 10U TID, Victoza, metformin 250 mg QID. -Continue home glargine 24 units qAM -Sensitive SSI -Consider adding back home Novolog 10U TID with SSI -CBG monitoring QID -Check A1C tomorrow AM -S/p 10U novolog, CBG recheck in 1 hr -Hold home Victoza and metformin   Atrial Fibrillation with RVR Initial HR in the 130's-140's, rates have improved with fluid resuscitation to the 110's. Last HR of 106. Currently holding metop succinate due to hypotension. Expecting BP and HR to improve with fluid resuscitation.  -Start metop succinate back at 10 AM, 9/14 -Continue to monitor HR, which is improving with fluid resuscitation -Cardiac monitoring -Continue home Eliquis 5 mg BID   Urinary retention Bladder scan with report of > 700 mL. In and out pending. Purewick in place.  Due to difficulties in retrieving urine and the severity of the patient's sepsis antibiotics were started prior to retrieving urine cultures. -UA -Ucx (will be collected after ABX, unfortunately) -Bladder scans q6hrs   HFpEF Last echo 2021, EF 60-65%, grade 2 DD. S/p 2.5L LR, 500 cc bolus running.  No crackles on lung exam with chest x-ray showing no active cardiopulmonary disease.  Patient with no pitting edema of the lower  extremities at the time of admission.  Due to patient's sepsis patient is in need of significant fluid resuscitation and has dry mucous membranes, however we do have to be cautious with her history of heart failure. -Continue to monitor volume  status -Use IVF with caution -Fluids as above   Chest pain Patient complaining of cp on admission. Resolved by time of our assessment. Flat troponins (17-31-31), no need to recheck. Likely demand ischemia. -Cardiac monitoring   AKI Baseline creatinine of 1-1.1. Presented with cr of 1.46. Has improved to 1.30 with 2.5 L of LR bolus. Given an additional 500 cc LR bolus currently.  Likely due to patient's sepsis and anticipated to continue to improve with fluid resuscitation. -AM BMP -Fluids as above   Hx of iron deficiency anemia Hgb of 12.4 on admission.  -Continue home Ferrous sulfate 325 mg BID -A.m. labs   HLD Continue home atorvastatin 40 mg and Zetia 10 mg   FEN/GI: heart healthy/carb modified diet Prophylaxis: Eliquis   Disposition: Progressive   History of Present Illness:  Emily Farmer is a 65 y.o. female presenting with sepsis. She states she felt a shivering sensation and thought her sugar must be high and so she came to the ED. She was also experiencing chest pain, abdominal pain, and vomiting.  She states her chest and abdominal pain have improved. She was experiencing new lower back pain on her left side that she hasn't had before.    Tobacco - never Alcohol - none Drugs - none   Review Of Systems: Per HPI with the following additions:    Review of Systems  Constitutional:  Positive for chills.  Respiratory:  Negative for shortness of breath.   Cardiovascular:  Positive for chest pain and palpitations.  Gastrointestinal:  Positive for vomiting. Negative for abdominal pain and anal bleeding.  Genitourinary:  Negative for dysuria and frequency.  Musculoskeletal:  Positive for back pain.  Neurological:  Negative for numbness and headaches.         Patient Active Problem List    Diagnosis Date Noted   Sepsis ([employer]) 02/27/2021   Secondary hypercoagulable state ([place]) 05/08/2020   Type 2 diabetes mellitus with diabetic polyneuropathy, with long-term current use of  insulin ([place]) 09/23/2019   Type 2 diabetes mellitus with hyperglycemia, with long-term current use of insulin ([place]) 09/23/2019   Dyslipidemia 09/23/2019   Toenail fungus 07/19/2018   Depression 07/19/2018   Iron deficiency anemia due to chronic blood loss 07/19/2018   Gastritis and duodenitis 07/19/2018   Atrial fibrillation with slow ventricular response ([place]) 01/28/2018   Hyponatremia 01/28/2018   Chronic diastolic CHF (congestive heart failure) ([place]) 01/28/2018   Mixed hyperlipidemia 06/01/2017   Paroxysmal A-fib ([place]) 10/04/2016   Essential hypertension 02/15/2013   Hypertriglyceridemia 02/15/2013   Diabetes mellitus type 2 in obese ([place]) 02/15/2013   Coronary atherosclerosis seen on CT 02/15/2013      Past Medical History:     Past Medical History:  Diagnosis Date   Arthritis     Atrial fibrillation ([place])      a. 09/2016 in setting of pancreatitis;  b. 09/2016 Echo: EF 65-60%, no rwma, Gr2 DD, mild MR, triv TR, PASP 16mHg;  CHA2DS2VASc = 4-->Eliquis 561mBID. c. Recurred 06/2018 in setting of GIB.   Chronic diastolic CHF (congestive heart failure) ([place]/15/2019   Diabetes mellitus     GI bleeding 06/2018   Gout     Hyperlipidemia     Hypertension  Hypertriglyceridemia     Pancreatitis      a. 09/2016 - Triglycerides 1,392 on admission.      Past Surgical History:      Past Surgical History:  Procedure Laterality Date   BIOPSY   06/20/2018    Procedure: BIOPSY;  Surgeon: Clarene Essex, MD;  Location: [facility];  Service: Endoscopy;;   CHOLECYSTECTOMY N/A 01/04/2014    Procedure: LAPAROSCOPIC CHOLECYSTECTOMY WITH INTRAOPERATIVE CHOLANGIOGRAM;  Surgeon: Ralene Ok, MD;  Location: [place];  Service: General;  Laterality: N/A;   COLONOSCOPY WITH PROPOFOL N/A 06/21/2018    Procedure: COLONOSCOPY WITH PROPOFOL;  Surgeon: Ronald Lobo, MD;  Location: [place];  Service: Endoscopy;  Laterality: N/A;   ESOPHAGOGASTRODUODENOSCOPY (EGD) WITH PROPOFOL N/A 06/20/2018     Procedure: ESOPHAGOGASTRODUODENOSCOPY (EGD) WITH PROPOFOL;  Surgeon: Clarene Essex, MD;  Location: [place];  Service: Endoscopy;  Laterality: N/A;   LUNG SURGERY          Social History: Social History        Tobacco Use   Smoking status: Never   Smokeless tobacco: Never  Vaping Use   Vaping Use: Never used  Substance Use Topics   Alcohol use: No   Drug use: No    Additional social history:  denies any drug use Please also refer to relevant sections of EMR.   Family History:      Family History  Problem Relation Age of Onset   Heart attack Mother     Heart attack Father     Diabetes Sister     High blood pressure Neg Hx     High Cholesterol Neg Hx        Allergies and Medications: No Known Allergies No current facility-administered medications on file prior to encounter.          Current Outpatient Medications on File Prior to Encounter  Medication Sig Dispense Refill   acetaminophen (TYLENOL) 325 MG tablet Take 2 tablets (650 mg total) by mouth every 6 (six) hours as needed for mild pain (or Fever >/= 101).       apixaban (ELIQUIS) 5 MG TABS tablet Take 1 tablet (5 mg total) by mouth 2 (two) times daily. 60 tablet 0   atorvastatin (LIPITOR) 40 MG tablet Take 1 tablet (40 mg total) by mouth daily. 90 tablet 3   Blood Glucose Monitoring Suppl (TRUE METRIX METER) w/Device KIT Use to monitor blood sugar as directed (Patient taking differently: 1 each by Other route See admin instructions. Use to monitor blood sugar as directed) 1 kit 0   ezetimibe (ZETIA) 10 MG tablet Take 1 tablet (10 mg total) by mouth daily. To help lower lipids 90 tablet 3   ferrous sulfate 325 (65 FE) MG tablet TAKE 1 TABLET (325 MG TOTAL) BY MOUTH 2 (TWO) TIMES DAILY WITH A MEAL. (Patient taking differently: Take 325 mg by mouth 2 (two) times daily with a meal.) 60 tablet 3   glucose blood test strip USE AS INSTRUCTED TO CHECK BLOOD SUGARS 3 TIMES DAILY (Patient taking differently: 1 each by Other  route See admin instructions. USE AS INSTRUCTED TO CHECK BLOOD SUGARS 3 TIMES DAILY) 100 each 11   Insulin Glargine (BASAGLAR KWIKPEN) 100 UNIT/ML INJECT 28 UNITS INTO THE SKIN AT BEDTIME. (Patient taking differently: Inject 28 Units into the skin at bedtime.) 30 mL 6   insulin glargine (LANTUS) 100 UNIT/ML injection Inject 24 Units into the skin at bedtime.       insulin lispro (HUMALOG KWIKPEN)  100 UNIT/ML KwikPen Inject 0.1 mLs (10 Units total) into the skin 3 (three) times daily. 30 mL 11   Insulin Syringe-Needle U-100 (TRUEPLUS INSULIN SYRINGE) 31G X 5/16" 0.3 ML MISC Use to inject insulin daily. (Patient taking differently: 1 each by Other route See admin instructions. Use to inject insulin daily.) 100 each 11   liraglutide (VICTOZA) 18 MG/3ML SOPN INJECT 0.6 MG INTO THE SKIN DAILY. (Patient taking differently: Inject 18 mg into the skin daily.) 3 mL 6   metFORMIN (GLUCOPHAGE) 500 MG tablet Take 0.5 tablets (250 mg total) by mouth 4 (four) times daily. 60 tablet 2   metoprolol succinate (TOPROL-XL) 25 MG 24 hr tablet TAKE 1 TABLET (25 MG TOTAL) BY MOUTH IN THE MORNING AND AT BEDTIME. (Patient not taking: No sig reported) 180 tablet 3   metoprolol succinate (TOPROL-XL) 25 MG 24 hr tablet Take 1 tablet (25 mg total) by mouth daily. Take 2 tablets, 50 mg Toprol-XL daily.  If your heart rate is less than 60 and your blood pressure is lower than 110/60, go back to your once daily tablet. (Patient taking differently: Take 25 mg by mouth 2 (two) times daily.) 30 tablet 0   Multiple Vitamins-Minerals (CENTRUM SILVER PO) Take 1 tablet by mouth daily.       mupirocin ointment (BACTROBAN) 2 % Place 1 application into the nose 2 (two) times daily. 30 g 0   TRUEplus Lancets 28G MISC USE TO CHECK BLOOD SUGAR 3 TIMES DAILY (Patient taking differently: 1 each by Other route See admin instructions. USE TO CHECK BLOOD SUGAR 3 TIMES DAILY) 100 each 11      Objective: BP 111/74   Pulse (!) 102   Temp 98 F (36.7  C) (Oral)   Resp 17   Ht '5\' 3"'  (1.6 m)   Wt 77.1 kg   SpO2 100%   BMI 30.11 kg/m  Physical Exam Vitals reviewed.  HENT:     Mouth/Throat:     Mouth: Mucous membranes are dry.  Cardiovascular:     Rate and Rhythm: Tachycardia present. Rhythm irregular.     Heart sounds: No murmur heard. Pulmonary:     Effort: Pulmonary effort is normal. No respiratory distress.     Breath sounds: Normal breath sounds. No wheezing, rhonchi or rales.  Abdominal:     General: Bowel sounds are normal.     Tenderness: There is no abdominal tenderness. Positive CVA tenderness left side. Skin:    Comments: Poor peripheral pulses  Neurological:     Mental Status: She is alert and oriented to person, place, and time.        Labs and Imaging: CBC BMET  Last Labs        Recent Labs  Lab 02/26/21 1915 02/26/21 1924   WBC 5.7  --    HGB 12.4 12.6  12.2   HCT 37.1 37.0  36.0   PLT 182  --        Last Labs        Recent Labs  Lab 02/26/21 1915 02/26/21 1924   NA 133* 133*  133*   K 3.8 3.8  3.8   CL 100 104   CO2 17*  --    BUN 29* 28*   CREATININE 1.46* 1.30*   GLUCOSE 473* 499*   CALCIUM 8.7*  --           EKG: AF with RVR     Corky Sox PGY-1, Psychiatry [place] Intern pager: [phone],  text pages welcome   Upper Level Addendum:   I have seen and evaluated this patient along with Dr. Alvie Heidelberg and reviewed the above note, making necessary revisions as appropriate.  I agree with the medical decision making and physical exam as noted above.   Lurline Del, DO PGY-3 [facility]

## 2021-02-27 NOTE — Progress Notes (Signed)
PHARMACY - PHYSICIAN COMMUNICATION CRITICAL VALUE ALERT - BLOOD CULTURE IDENTIFICATION (BCID)  Emily Farmer is an 65 y.o. female who presented to [facility] on 02/26/2021 with a chief complaint of pyelonephritis.  Assessment:  1 of 2 sets blood cultures (aerobic and anaerobic) growing klebsiella, no resistance. Patient previously had pan-sensitive Klebsiella UTI. Patient continues to have hypotension with Afib RVR.   Name of physician (or Provider) Contacted: Levert Feinstein  Current antibiotics: Cefepime + vancomycin  Changes to prescribed antibiotics recommended:  Deescalate to ceftriaxone only. Recommendations accepted by provider    Results for orders placed or performed during the hospital encounter of 02/26/21  Blood Culture ID Panel (Reflexed) (Collected: 02/26/2021  9:34 PM)  Result Value Ref Range   Enterococcus faecalis NOT DETECTED NOT DETECTED   Enterococcus Faecium NOT DETECTED NOT DETECTED   Listeria monocytogenes NOT DETECTED NOT DETECTED   Staphylococcus species NOT DETECTED NOT DETECTED   Staphylococcus aureus (BCID) NOT DETECTED NOT DETECTED   Staphylococcus epidermidis NOT DETECTED NOT DETECTED   Staphylococcus lugdunensis NOT DETECTED NOT DETECTED   Streptococcus species NOT DETECTED NOT DETECTED   Streptococcus agalactiae NOT DETECTED NOT DETECTED   Streptococcus pneumoniae NOT DETECTED NOT DETECTED   Streptococcus pyogenes NOT DETECTED NOT DETECTED   A.calcoaceticus-baumannii NOT DETECTED NOT DETECTED   Bacteroides fragilis NOT DETECTED NOT DETECTED   Enterobacterales DETECTED (A) NOT DETECTED   Enterobacter cloacae complex NOT DETECTED NOT DETECTED   Escherichia coli NOT DETECTED NOT DETECTED   Klebsiella aerogenes NOT DETECTED NOT DETECTED   Klebsiella oxytoca NOT DETECTED NOT DETECTED   Klebsiella pneumoniae DETECTED (A) NOT DETECTED   Proteus species NOT DETECTED NOT DETECTED   Salmonella species NOT DETECTED NOT DETECTED   Serratia marcescens NOT  DETECTED NOT DETECTED   Haemophilus influenzae NOT DETECTED NOT DETECTED   Neisseria meningitidis NOT DETECTED NOT DETECTED   Pseudomonas aeruginosa NOT DETECTED NOT DETECTED   Stenotrophomonas maltophilia NOT DETECTED NOT DETECTED   Candida albicans NOT DETECTED NOT DETECTED   Candida auris NOT DETECTED NOT DETECTED   Candida glabrata NOT DETECTED NOT DETECTED   Candida krusei NOT DETECTED NOT DETECTED   Candida parapsilosis NOT DETECTED NOT DETECTED   Candida tropicalis NOT DETECTED NOT DETECTED   Cryptococcus neoformans/gattii NOT DETECTED NOT DETECTED   CTX-M ESBL NOT DETECTED NOT DETECTED   Carbapenem resistance IMP NOT DETECTED NOT DETECTED   Carbapenem resistance KPC NOT DETECTED NOT DETECTED   Carbapenem resistance NDM NOT DETECTED NOT DETECTED   Carbapenem resist OXA 48 LIKE NOT DETECTED NOT DETECTED   Carbapenem resistance VIM NOT DETECTED NOT DETECTED   Alphia Moh, PharmD, BCPS, BCCP Clinical Pharmacist  Please check AMION for all [facility] phone numbers After 10:00 PM, call Main Pharmacy [phone]

## 2021-02-27 NOTE — ED Notes (Signed)
Manual BP 90/60 per RN order

## 2021-02-27 NOTE — Consult Note (Addendum)
Cardiology Consultation:   Patient ID: [name] MRN: [id number]; DOB: [birth year]  Admit date: 02/26/2021 Date of Consult: 02/27/2021  PCP:  Antony Blackbird, MD (Inactive)   [place] Providers Cardiologist:  Pixie Casino, MD Afib clinic: [facility]     Patient Profile:   Emily Farmer is a 65 y.o. female with a hx of HTN, HLD, DM II, pancreatitis and a history of persistent atrial fibrillation who is being seen 02/27/2021 for the evaluation of atrial fibrillation with RVR in the setting of pyelonephritis and possible septic shock at the request of Dr. McDiarmid.  History of Present Illness:   Emily Farmer is a pleasant 65 year old female with past medical history of HTN, HLD, DM II, pancreatitis and a history of persistent atrial fibrillation.  She was first diagnosed with atrial fibrillation in the setting of acute pancreatitis in 2018.  Echocardiogram obtained at the time showed EF of 65 to 70%, grade 2 DD, mild MR, cannot exclude PFO.  For a while, she was noncompliant with her medication mainly due to cost issue.  Fortunately, she has obtained Medicaid which helped her with the cost of medication.  She was admitted in August 2019 with hyperosmolar hyperglycemic nonketotic state after she was unable to afford insulin.  Hospital course was complicated by atrial fibrillation with RVR, after fluid resuscitation, A. fib self resolved.  In January 2020, she was admitted with symptomatic anemia with hemoglobin of 7.2.  She also had dark stools the week prior to arrival.  Hospital course of was again complicated by atrial fibrillation with RVR.  She was able to convert to sinus rhythm after a dose of Lopressor and amiodarone.  Echocardiogram obtained at that time showed EF 55 to 60%, mild MR, PAP pressure 34 mmHg, small pericardial effusion.  Amiodarone was later discontinued due to bradycardia.  The last time she was still in sinus rhythm was in March 2020.  Since September 2021, she has been  in persistent atrial fibrillation.  Rate control strategy has been pursued.  Her heart rate is only borderline controlled most of the time in the low 100 range.  Repeat echocardiogram on 03/22/2020 showed EF 60 to 65%, no regional wall motion abnormality, grade 3 DD, mild MR. She was seen by A. fib clinic in November 2021, rate control therapy was further increased as her heart rate was about 118.  She was last seen by Dr. Debara Pickett in December 2021, heart rate was 107 while in atrial fibrillation.  She was seen in the ED in July 2022 for acute cystitis.  She was prescribed cephalexin upon discharge.  She has been in her normal state of health until yesterday 02/26/2021 when she started having abdominal pain, nausea and vomiting.  She also had some intermittent chest pain, fluttering sensation and significant chills.  On arrival to [facility], she was febrile with temperature 100.5.  Creatinine 1.46, lactic acid 3.0, first troponin negative.  Hemoglobin 12.4 blood sugar was 470.  Blood culture was obtained due to concern for sepsis.  Urinalysis showed greater than 500 glucose, positive nitrite.  CT of the abdomen and pelvis concerning for focal pyelonephritis involving the lower pole region on the left kidney.  Hospital course was also complicated by urinary retention requiring in and out cath.  Despite the fact the patient mentioned she has been compliant with her medications including Eliquis and insulin, her hemoglobin A1c was 12.8.  42-monthago, her hemoglobin A1c was 14.6.  Overnight, patient became hypotensive and  a she has received 3.5 L of IV fluid so far.  EKG on initial arrival showed atrial fibrillation with RVR, heart rate was in the 130s to 140s range.  Her home metoprolol succinate has been held.  So far she received 5 mg of IV Lopressor and 25 mg of metoprolol tartrate this morning.  Current heart rate has slowed down to the low 100 range which is near her baseline.  Cardiology service has been  consulted for atrial fibrillation with RVR.   Past Medical History:  Diagnosis Date   Arthritis    Atrial fibrillation ([place])    a. 09/2016 in setting of pancreatitis;  b. 09/2016 Echo: EF 65-60%, no rwma, Gr2 DD, mild MR, triv TR, PASP 47mHg;  CHA2DS2VASc = 4-->Eliquis 558mBID. c. Recurred 06/2018 in setting of GIB.   Chronic diastolic CH[place] (congestive heart failure[place]   Diabetes mellitus    GI bleeding 06/2018   Gout    Hyperlipidemia    Hypertension    Hypertriglyceridemia    Pancreatitis    a. 09/2016 - Triglycerides 1,392 on admission.    Past Surgical History:  Procedure Laterality Date   BIOPSY  06/20/2018   Procedure: BIOPSY;  Surgeon: MaClarene EssexMD;  [place] [facility] Service: Endoscopy;;   CHOLECYSTECTOMY N/A 01/04/2014   Procedure: LAPAROSCOPIC CHOLECYSTECTOMY WITH INTRAOPERATIVE CHOLANGIOGRAM;  Surgeon: ArRalene OkMD;  [facility]: [facility];  Laterality: N/A;   COLONOSCOPY WITH PROPOFOL N/A 06/21/2018   Procedure: COLONOSCOPY WITH PROPOFOL;  Surgeon: BuRonald LoboMD;  [facility]: Endoscopy;  Laterality: N/A;   ESOPHAGOGASTRODUODENOSCOPY (EGD) WITH PROPOFOL N/A 06/20/2018   Procedure: ESOPHAGOGASTRODUODENOSCOPY (EGD) WITH PROPOFOL;  Surgeon: MaClarene EssexMD;  [place]: [facility];  Laterality: N/A;   LUNG SURGERY       Home Medications:  Prior to Admission medications   Medication Sig Start Date End Date Taking? Authorizing Provider  metoprolol succinate (TOPROL-XL) 25 MG 24 hr tablet Take 25 mg by mouth daily. Take 1 tablet (25 mg total) by mouth daily. Take 2 tablets, 50 mg Toprol-XL daily.  If your heart rate is less than 60 and your blood pressure is lower than 110/60, go back to your once daily tablet.   Yes [provider]  acetaminophen (TYLENOL) 325 MG tablet Take 2 tablets (650 mg total) by mouth every 6 (six) hours as needed for mild pain (or Fever >/= 101). 02/01/18    Elgergawy, DaSilver HugueninMD  apixaban (ELIQUIS) 5 MG TABS tablet Take 1 tablet (5 mg total) by mouth 2 (two) times daily. 10/20/20 01/03/21  GrCorena HerterPA-C  atorvastatin (LIPITOR) 40 MG tablet Take 1 tablet (40 mg total) by mouth daily. 04/18/19   Fulp, Cammie, MD  Blood Glucose Monitoring Suppl (TRUE METRIX METER) w/Device KIT Use to monitor blood sugar as directed Patient taking differently: 1 each by Other route See admin instructions. Use to monitor blood sugar as directed 02/22/18   Fulp, Cammie, MD  ezetimibe (ZETIA) 10 MG tablet Take 1 tablet (10 mg total) by mouth daily. To help lower lipids 03/06/20   Fulp, Cammie, MD  ferrous sulfate 325 (65 FE) MG tablet TAKE 1 TABLET (325 MG TOTAL) BY MOUTH 2 (TWO) TIMES DAILY WITH A MEAL. Patient taking differently: Take 325 mg by mouth 2 (two) times daily with a meal. 05/09/20 05/09/21  JoLadell PierMD  glucose blood test strip USE AS INSTRUCTED TO CHECK BLOOD SUGARS  3 TIMES DAILY Patient taking differently: 1 each by Other route See admin instructions. USE AS INSTRUCTED TO CHECK BLOOD SUGARS 3 TIMES DAILY 03/27/20   Fulp, Cammie, MD  Insulin Glargine (BASAGLAR KWIKPEN) 100 UNIT/ML INJECT 28 UNITS INTO THE SKIN AT BEDTIME. Patient taking differently: Inject 28 Units into the skin at bedtime. 03/02/20 03/02/21  Fulp, Cammie, MD  insulin lispro (HUMALOG KWIKPEN) 100 UNIT/ML KwikPen Inject 0.1 mLs (10 Units total) into the skin 3 (three) times daily. 12/23/19   Shamleffer, Melanie Crazier, MD  Insulin Syringe-Needle U-100 (TRUEPLUS INSULIN SYRINGE) 31G X 5/16" 0.3 ML MISC Use to inject insulin daily. Patient taking differently: 1 each by Other route See admin instructions. Use to inject insulin daily. 05/06/18   Fulp, Cammie, MD  liraglutide (VICTOZA) 18 MG/3ML SOPN INJECT 0.6 MG INTO THE SKIN DAILY. Patient taking differently: Inject 18 mg into the skin daily. 03/12/20 03/12/21  Shamleffer, Melanie Crazier, MD  metFORMIN (GLUCOPHAGE) 500 MG tablet Take 0.5  tablets (250 mg total) by mouth 4 (four) times daily. 09/27/19   Ladell Pier, MD  Multiple Vitamins-Minerals (CENTRUM SILVER PO) Take 1 tablet by mouth daily.    [provider]  mupirocin ointment (BACTROBAN) 2 % Place 1 application into the nose 2 (two) times daily. 07/19/18   Elsie Stain, MD  TRUEplus Lancets 28G MISC USE TO CHECK BLOOD SUGAR 3 TIMES DAILY Patient taking differently: 1 each by Other route See admin instructions. USE TO CHECK BLOOD SUGAR 3 TIMES DAILY 03/27/20 03/27/21  Antony Blackbird, MD    Inpatient Medications: Scheduled Meds:  apixaban  5 mg Oral BID   atorvastatin  40 mg Oral Daily   ezetimibe  10 mg Oral Daily   ferrous sulfate  325 mg Oral BID WC   insulin aspart  0-9 Units Subcutaneous TID WC   insulin glargine-yfgn  24 Units Subcutaneous Daily   metoprolol tartrate  25 mg Oral BID   vancomycin variable dose per unstable renal function (pharmacist dosing)   Does not apply See admin instructions   Continuous Infusions:  ceFEPime (MAXIPIME) IV 2 g (02/27/21 0929)   lactated ringers 75 mL/hr at 02/27/21 0927   metronidazole Stopped (02/27/21 0912)   PRN Meds: acetaminophen  Allergies:   No Known Allergies  Social History:   Social History   Socioeconomic History   Marital status: Single    Spouse name: Not on file   Number of children: Not on file   Years of education: Not on file   Highest education level: Not on file  Occupational History   Not on file  Tobacco Use   Smoking status: Never   Smokeless tobacco: Never  Vaping Use   Vaping Use: Never used  Substance and Sexual Activity   Alcohol use: No   Drug use: No   Sexual activity: Not Currently  Other Topics Concern   Not on file  Social History Narrative   Lives with her sister and does not need any assistance with ADLs.     Social Determinants of Health   Financial Resource Strain: Not on file  Food Insecurity: Not on file  Transportation Needs: Not on file   Physical Activity: Not on file  Stress: Not on file  Social Connections: Not on file  Intimate Partner Violence: Not on file    Family History:    Family History  Problem Relation Age of Onset   Heart attack Mother    Heart attack Father  Diabetes Sister    High blood pressure Neg Hx    High Cholesterol Neg Hx      ROS:  Please see the history of present illness.   All other ROS reviewed and negative.     Physical Exam/Data:   Vitals:   02/27/21 0730 02/27/21 0748 02/27/21 0800 02/27/21 0830  BP: _0 (!) 85/61  Pulse: (!) 110  (!) 117 (!) 124  Resp: (!) 24  (!) 29 (!) 24  Temp:      TempSrc:      SpO2: 98%  95% 92%  Weight:      Height:        Intake/Output Summary (Last 24 hours) at 02/27/2021 1010 Last data filed at 02/27/2021 0940 Gross per 24 hour  Intake 4597.49 ml  Output 1425 ml  Net 3172.49 ml   Last 3 Weights 02/26/2021 10/23/2020 10/20/2020  Weight (lbs) 170 lb 170 lb 170 lb  Weight (kg) 77.111 kg 77.111 kg 77.111 kg     Body mass index is 30.11 kg/m.  General:  Well nourished, well developed, in no acute distress HEENT: normal Lymph: no adenopathy Neck: no JVD Endocrine:  No thryomegaly Vascular: No carotid bruits; FA pulses 2+ bilaterally without bruits  Cardiac:  irregularly irregular; no murmur  Lungs:  clear to auscultation bilaterally, no wheezing, rhonchi or rales  Abd: soft, nontender, no hepatomegaly  Ext: no edema Musculoskeletal:  No deformities, BUE and BLE strength normal and equal Skin: warm and dry  Neuro:  CNs 2-12 intact, no focal abnormalities noted Psych:  Normal affect   EKG:  The EKG was personally reviewed and demonstrates:  atrial fibrillation with RVR Telemetry:  Telemetry was personally reviewed and demonstrates:  atrial fibrillation with HR 130s initially, now HR 100-110s  Relevant CV Studies:  Echo 03/22/2020  1. Left ventricular ejection fraction, by estimation, is 60 to 65%. Left  ventricular  ejection fraction by PLAX is 62 %. The left ventricle has  normal function. The left ventricle has no regional wall motion  abnormalities. There is mild concentric left  ventricular hypertrophy. Left ventricular diastolic parameters are  consistent with Grade II diastolic dysfunction (pseudonormalization).   2. Right ventricular systolic function is normal. The right ventricular  size is normal.   3. Tricuspid valve regurgitation is mild to moderate.   4. The mitral valve is normal in structure. Mild mitral valve  regurgitation.   5. The aortic valve is calcified. Aortic valve regurgitation is not  visualized. Mild aortic valve sclerosis is present, with no evidence of  aortic valve stenosis.   Comparison(s): Compared to prior (LTS) Study, increase in TR.   Laboratory Data:  High Sensitivity Troponin:   Recent Labs  Lab 02/26/21 1915 02/26/21 2150 02/27/21 0025  TROPONINIHS 17 31* 31*     Chemistry Recent Labs  Lab 02/26/21 1915 02/26/21 1924 02/27/21 0253  NA 133* 133*  133* 131*  K 3.8 3.8  3.8 3.6  CL 100 104 101  CO2 17*  --  18*  GLUCOSE 473* 499* 469*  BUN 29* 28* 27*  CREATININE 1.46* 1.30* 1.37*  CALCIUM 8.7*  --  8.2*  GFRNONAA 40*  --  43*  ANIONGAP 16*  --  12    Recent Labs  Lab 02/26/21 1915 02/27/21 0253  PROT 6.4* 5.8*  ALBUMIN 3.2* 2.8*  AST 14* 20  ALT 12 14  ALKPHOS 105 90  BILITOT 1.0 1.1   Hematology Recent Labs  Lab  02/26/21 1915 02/26/21 1924 02/27/21 0253  WBC 5.7  --  15.8*  RBC 4.20  --  3.81*  HGB 12.4 12.6  12.2 11.3*  HCT 37.1 37.0  36.0 34.2*  MCV 88.3  --  89.8  MCH 29.5  --  29.7  MCHC 33.4  --  33.0  RDW 12.9  --  13.2  PLT 182  --  144*   BNPNo results for input(s): BNP, PROBNP in the last 168 hours.  DDimer No results for input(s): DDIMER in the last 168 hours.   Radiology/Studies:  DG Chest 1 View  Result Date: 02/26/2021 CLINICAL DATA:  Chest pain EXAM: CHEST  1 VIEW COMPARISON:  01/03/2021 FINDINGS:  Lungs are clear.  No pleural effusion or pneumothorax. The heart is normal in size. Surgical clips overlying the right chest wall. IMPRESSION: No evidence of acute cardiopulmonary disease. Electronically Signed   By: Julian Hy M.D.   On: 02/26/2021 20:31   CT ABDOMEN PELVIS W CONTRAST  Result Date: 02/26/2021 CLINICAL DATA:  Acute abdominal pain. EXAM: CT ABDOMEN AND PELVIS WITH CONTRAST TECHNIQUE: Multidetector CT imaging of the abdomen and pelvis was performed using the standard protocol following bolus administration of intravenous contrast. CONTRAST:  65m OMNIPAQUE IOHEXOL 350 MG/ML SOLN COMPARISON:  03/14/2018 FINDINGS: Lower chest: Stable postoperative changes at the right lung base. No acute pulmonary findings or pleural effusion. Age advanced three-vessel coronary artery calcifications are noted. The heart is normal in size. No pericardial effusion. Hepatobiliary: No hepatic lesions or intrahepatic biliary dilatation. The gallbladder is surgically absent. No common bile duct dilatation. Pancreas: No mass, inflammation or ductal dilatation. Spleen: Normal size.  No focal lesions. Adrenals/Urinary Tract: Adrenal glands are normal. No renal lesions or hydroureteronephrosis. Stable renal cortical scarring changes involving the right kidney. The lower pole region of the left kidney demonstrates an area of decreased perfusion and a small amount adjacent perinephric interstitial change. Findings could be due to focal pyelonephritis. Recommend correlation with any left flank pain and recommend correlation with urinalysis. The bladder is unremarkable. No bladder wall thickening or bladder calculi. Stomach/Bowel: The stomach, duodenum, small bowel and colon are unremarkable. No acute inflammatory changes, mass lesions or obstructive findings. The terminal ileum and appendix are normal. Vascular/Lymphatic: Minimal scattered atherosclerotic calcifications involving the aorta and iliac arteries but no  aneurysm or dissection. The branch vessels are patent. The major venous structures are patent. No mesenteric or retroperitoneal mass or adenopathy. Reproductive: The uterus and ovaries are unremarkable. Other: Stable rim calcified structure near the dome of the bladder. Stable periumbilical abdominal wall hernia containing fat and also part of the transverse colon. Musculoskeletal: No significant bony findings. IMPRESSION: 1. CT findings suspicious for focal pyelonephritis involving the lower pole region of the left kidney. Recommend correlation with any left flank pain and recommend correlation with urinalysis. 2. No other significant abdominal/pelvic findings, mass lesions or adenopathy. 3. Status post cholecystectomy. No biliary dilatation. 4. Stable periumbilical abdominal wall hernia containing fat and part of the transverse colon. 5. Age advanced three-vessel coronary artery calcifications. Electronically Signed   By: PMarijo SanesM.D.   On: 02/26/2021 21:41     Assessment and Plan:   Atrial fibrillation with RVR: She has been in persistent atrial fibrillation since at least September 2021.  Heart rate variability likely driven by underlying infection.  After 3.5 liters of IV fluid, 5 mg of IV Lopressor and a 25 mg metoprolol tartrate, now her heart rate is back to her baseline of  low 100 range.  I will hold off on adding amiodarone at this time.  Home metoprolol succinate has been switched to metoprolol tartrate by primary team.  We will continue on the current regimen.  If heart rate becomes uncontrolled, we will add IV amiodarone.  Patient has a history of bradycardia on amiodarone in 2020, however this is not a absolute contraindication to restart amiodarone if needed.  Although patient says she has not missed any dose of Eliquis or her insulin for the past several weeks, however her hemoglobin A1c is still 12.8, making me suspect questionable compliance.  Pyelonephritis  -CT scan showed possible  focal pyelonephritis involving the lower pole of the left kidney.  She is currently receiving antibiotic and IV fluid.  Abdominal pain has completely resolved.  She no longer has chills.  - per primary team  Chest pain: Only had chest pain yesterday in the setting of pyelonephritis and atrial fibrillation with RVR.  Chest pain has not recurred since.  Serial troponin 17 --> 31 --> 31, likely suggestive of demand ischemia in the setting of hypotension, sepsis and pyelonephritis  Septic shock: So far received 3.5 L of IV fluid, systolic blood pressure remain low.  Last dose of 5 mg IV Lopressor and 25 mg metoprolol tartrate was between 5-6 AM this morning.  If blood pressure does not come up, may need pressors.  Urinary retention: in and out cath  Hypertension: Hypotensive during this admission.  On metoprolol succinate at home, medication has been switched to metoprolol tartrate 25 mg twice a day in the hospital.   Hyperlipidemia  DM2: Hemoglobin 12.8.  Patient states she is compliant with her home medication, however diabetes is clearly uncontrolled.  Her hemoglobin A1c was greater than 15.5 in September 2021.  Compliance is somewhat questionable.  History of pancreatitis: In 2018   Risk Assessment/Risk Scores:     HEAR Score (for undifferentiated chest pain):  HEAR Score: 4    CHA2DS2-VASc Score = 3   This indicates a 3.2% annual risk of stroke. The patient's score is based upon: CHF History: 0 HTN History: 1 Diabetes History: 1 Stroke History: 0 Vascular Disease History: 0 Age Score: 0 Gender Score: 1         For questions or updates, please contact [place] Please consult [email or web address] for contact info under    Signed, Almyra Deforest, [place]  02/27/2021 10:10 AM  Personally seen and examined. Agree with above.  65 year old with permanent atrial fibrillation here with rapid ventricular response.  In clinic she has been running in the low 100s to 110 range fairly  consistently.  In the past we have tried amiodarone but she developed bradycardia in the 40s and 50s and therefore this was stopped.  Currently she comes in with infection, pyelonephritis, has history of pancreatitis.  Blood pressures are soft, shocky, septic shock.  44H to 90 systolic.  She has received approximately 3 L of fluid resuscitation.  She is currently mentating well.  Breathing comfortably.  Low-dose IV metoprolol has been trialed for reduction in heart rate.  Telemetry reviewed in the emergency room.  Lab work reviewed.  Creatinine 1.37 troponin flat at 31-demand ischemia in the setting of septic shock, recent echocardiogram reviewed with EF of 65%.  EKG also personally reviewed showing A. fib with RVR initially in the 130s now down to 100-110.  Atrial fibrillation - Seems to be fairly reasonable currently.  We will try to avoid IV amiodarone if possible given her prior  bradycardia.  Okay to utilize an occasional IV metoprolol dose if necessary.  Given soft blood pressures, try to avoid overzealous use of metoprolol.  Septic shock - May require phenylephrine for blood pressure support.  Could also consider vasopressin.  If absolutely necessary norepinephrine could be utilized however this would exacerbate her overall heart rate.  Pyelonephritis - Per primary team.  CRITICAL CARE Performed by: Candee Furbish   Total critical care time: 40 minutes  Critical care time was exclusive of separately billable procedures and treating other patients.  Critical care was necessary to treat or prevent imminent or life-threatening deterioration.  Critical care was time spent personally by me on the following activities: development of treatment plan with patient and/or surrogate as well as nursing, discussions with consultants, evaluation of patient's response to treatment, examination of patient, obtaining history from patient or surrogate, ordering and performing treatments and interventions,  ordering and review of laboratory studies, ordering and review of radiographic studies, pulse oximetry and re-evaluation of patient's condition.

## 2021-02-27 NOTE — ED Notes (Signed)
Bladder scan per MD= >800. In and out per MD with an output of 

## 2021-02-27 NOTE — ED Notes (Signed)
Bladder scan showed 214 ml. RN notified.

## 2021-02-27 NOTE — ED Notes (Signed)
Admitting physician called to ask me where pt's UA was. I told him she's been unable to urinate and the ED tech got a bladder scan and it was >700. See new order for cath.

## 2021-02-27 NOTE — Progress Notes (Signed)
FPTS Interim Progress Note  S: Went to examine patient at bedside. BP had been soft (80s/50-60s). Patient was no longer having chills, remarked about what a rough night she had, but looked improved from what was signed out about 5 AM check. She had already eaten breakfast, no fasting CBG obtained.  O: BP 90/64   Pulse (!) 117   Temp 97.9 F (36.6 C) (Oral)   Resp (!) 29   Ht 5\' 3"  (1.6 m)   Wt 77.1 kg   SpO2 95%   BMI 30.11 kg/m   Nursing note and vitals reviewed GEN: age-appropriate, WW, resting comfortably in chair, NAD, obese Cardiac: tachycardic, irregularly irregular rhythm. Normal S1/S2. No murmurs, rubs, or gallops appreciated. 2+ radial pulses. No JVD. Lungs: Clear bilaterally to ascultation. No increased WOB, no accessory muscle usage. No w/r/r. Neuro: AOx3  Ext: no sacral or extremity edema Skin: no wounds, including no sacral pressure wounds Psych: Pleasant and appropriate    A/P: A fib with RVR due to sepsis, pyelonephritis thought to be source Patient had repeat BP 90/60, previously had run 110s to 160s. She received Metop tartrate 5mg  IV at 520 and then Metop tartrate 25mg  PO at 550. Patient's last echo in 03/2020 was G2DD with EF 60-65%, she is s/p 3.5L fluid resuscitation at this point. I will give another 500 cc LR bolus and recheck BP. HR fluctuating from 112 to 130, patient with resolved chest pain since 5 AM. Discussed with Dr. , attending, who recommended cardiology consult. Spoke with from cardiology, who stated that amiodarone would be appropriate next step. Cardiology will see patient today. Will recheck BP and HR after this bolus and add amio if necessary.  DM2 Patient had elevated glucose to 438 at 2AM, given 32U of novolog subq total last night. She has normal s cr at baseline, currently with AKI. After eating breakfast of toast, apple sauce, and juice, patient's CBG was 104. Concerned for stacking given AKI. Will change CBGs to be Q4H instead  of qAC.  04/2020, MD 02/27/2021, 8:27 AM PGY-3, [facility] Service pager [phone]

## 2021-02-27 NOTE — ED Notes (Signed)
Pt given tylenol since her stomach is sore from where they  mashed on it. Pt sat further up in bed and lights turned off. Denies further needs.

## 2021-02-27 NOTE — ED Notes (Signed)
Admitting team called me to ask to check BG and they will call me back and tell me what insulin dosage to give her.

## 2021-02-27 NOTE — ED Notes (Signed)
Report given to Lorren, RN 

## 2021-02-27 NOTE — ED Notes (Signed)
CBG 

## 2021-02-27 NOTE — ED Notes (Signed)
I spoke with Dr. Leary Roca about pt's bladder scan volume. She said to repeat in 6 hours (from 1500) and then call them back again to let them know the new amount.

## 2021-02-27 NOTE — ED Notes (Signed)
Pt in room A&O x4. Denies any needs at this time. Updated on plan of care. Pt receptive to information provided

## 2021-02-27 NOTE — ED Notes (Signed)
I re-introduced myself to pt. Pt pulled up in bed. Pt says her stomach is a little sore from the bladder scans they've been doing, but denies pain otherwise. AxO x4 with a GCS of 15. Pt given more ice water. TV turned on for pt and sat up. Denies further needs.  Admitting team paged regarding bladder scan volume.

## 2021-02-27 NOTE — Progress Notes (Signed)
FPTS Interim Progress Note  S:Patient sleeping. Went to check on her after 500cc bolus finished. She reports that she has not been able to urinate since I/O cath last night x1.  O: BP (!) 85/61   Pulse (!) 124   Temp 97.9 F (36.6 C) (Oral)   Resp (!) 24   Ht 5\' 3"  (1.6 m)   Wt 77.1 kg   SpO2 92%   BMI 30.11 kg/m   Nursing note and vitals reviewed GEN: age appropriate, WW, resting comfortably in chair, NAD, class II obesity Cardiac: irregularly irregular rhythm. Normal S1/S2. No murmurs, rubs, or gallops appreciated. 2+ radial pulses. No JVD. Lungs: Clear bilaterally to ascultation. No increased WOB, no accessory muscle usage. No w/r/r. Abdomen: Normoactive bowel sounds. No tenderness to deep or light palpation. Suprapubic area appears full Neuro: AOx3  Ext: no edema Psych: Pleasant and appropriate   A/P: A fib due to sepsis, pyelonephritis suspected source Patient still has soft BP, 90/60s, after bolus, however HR is much improved from 95-115 bpm. I will hold off starting amio at this time and continue to closely monitor.  Hypotension due to sepsis Will start maintenance IV with LR 75 mL/hr and closely monitor BP. Can also use small boluses of 250cc if persistently hypotensive. If patient had MAP <60 will call CCM for pressure support.   Urinary retention Patient had urinary retention earlier in the night. Bladder scans q6h. Most recent at 8:50 AM showed <800cc in bladder. Will I/O for 2nd time. If patient has retained urine one more time, will insert foley catheter.  , MD 02/27/2021, 9:14 AM PGY-3, [facility] Family Medicine Service pager [phone]

## 2021-02-27 NOTE — ED Notes (Signed)
Bladder scanned PT with a result of 738 mL

## 2021-02-27 NOTE — Progress Notes (Signed)
Spoke with pharmacy regarding patient's heart rate and recommendations for treatment for her A. fib RVR.  Noted that her heart rate has been improving with fluid resuscitation and the treatment of her sepsis.  Most recent heart rate was 114, substantially better than the 130s-140s she came in at.  Her pressures have been soft limiting our ability to use her home metoprolol medication.  Pharmacy recommended against using metoprolol and diltiazem given her current vitals.  Pharmacy was in agreement that though her heart rate is not quite at goal for atrial fibrillation they would recommend either continuing to monitor in the setting of treating her sepsis and providing fluids versus doing short-term amiodarone.  Weighing the risk and benefits we will continue to monitor it as her vitals are improving with fluid resuscitation and I anticipate her heart rate to become more appropriate and her blood pressure to improve to the point we can use her home metoprolol.

## 2021-02-27 NOTE — Progress Notes (Addendum)
Noted recent vitals showing heart rates in the 120s.  Was on my way to see the patient when I received a page that her heart rate jumped to the 180s.  Found patient with heart rates in the mid 180s showing A fib RVR on the monitor.  She was awake and alert at this time but shivering with complaint of chills.  Gave 1 dose of metoprolol 5 mg IV and rates improved substantially to the 120s.    BP (!) 159/99   Pulse (!) 124   Temp 97.9 F (36.6 C) (Oral)   Resp (!) 25   Ht 5\' 3"  (1.6 m)   Wt 77.1 kg   SpO2 100%   BMI 30.11 kg/m  General: Elderly female lying in bed shivering with visible signs of chills.  No respiratory distress. Heart: Tachycardic rate in the 180s on my initial encounter Lungs: Clear to auscultation, no wheezing or rales. Skin: Warm and dry Extremities: No lower knee edema of the extremity   A/P 65 year old female presenting in sepsis also with hyperglycemia, which has since resolved, and now with A. fib RVR.  Earlier in the night we had to be cautious with her A. fib due to her hypotension but the most recent blood pressure is elevated at 159/99.  I did give a dose of metoprolol 5 mg IV and her rate improved to the 120s as noted above. -We will put orders for metoprolol tartrate 25 mg twice daily to include now as her blood pressure has enough cushion for this.  I did discuss this with pharmacy versus a diltiazem drip for ongoing control and pharmacy recommended continue with the metoprolol tartrate PO for the time being and saving the diltiazem in case we could not maintain good control. -We will keep a close watch on her vitals -Tylenol 650 mg as needed for fever/chills or for mild pain -We will give another 500 cc bolus as she still had dry mucous membranes with lungs clear to auscultation no pitting edema of the lower extremities.  We are continuing to be cautious on this with her history of HFpEF.

## 2021-02-27 NOTE — ED Notes (Signed)
Pt pulled back up in bed and sat up. Pt otherwise denies needs. Waiting on dinner tray for insulin administration.

## 2021-02-27 NOTE — ED Notes (Signed)
Admitting paged to RN per her request 

## 2021-02-27 NOTE — Progress Notes (Addendum)
Pharmacy Antibiotic Note  Emily Farmer is a 65 y.o. female admitted on 02/26/2021 with urosepsis due to pyelonephritis.  Pharmacy has been consulted for cefepime and vancomycin dosing.  9/14 Vancomycin 1250mg  Q q48 hr Scr used: 1.37 mg/dL Weight: kg Vd coeff: 0.5 L/kg Est AUC: 487  Plan: Cefepime 2 gm every 12 hours Vancomycin 1250mg  q 48 hr Monitor cultures, clinical status, renal fx, vanc level Narrow abx as able and f/u duration    Height: 5\' 3"  (160 cm) Weight: 77.1 kg (170 lb) IBW/kg (Calculated) : 52.4  Temp (24hrs), Avg:98.8 F (37.1 C), Min:97.9 F (36.6 C), Max:100.5 F (38.1 C)  Recent Labs  Lab 02/26/21 1915 02/26/21 1924 02/26/21 2134 02/27/21 0025 02/27/21 0253  WBC 5.7  --   --   --  15.8*  CREATININE 1.46* 1.30*  --   --  1.37*  LATICACIDVEN 3.0*  --  2.6* 2.2*  --      Estimated Creatinine Clearance: 41.3 mL/min (A) (by C-G formula based on SCr of 1.37 mg/dL (H)).    No Known Allergies  Antimicrobials this admission: Cefepime 9/13 >> Vanc 9/13 >> MTZ 9/13 >> 9/14   Microbiology results: 9/13 BCx: ngtd 9/14 Ucx: pend   Thank you for allowing pharmacy to be a part of this patient's care.  10/14, PharmD, BCPS, BCCP Clinical Pharmacist  Please check AMION for all [facility] Pharm[facility] phone numbers After 10:00 PM, call Main Pharmacy [phone]

## 2021-02-27 NOTE — ED Notes (Signed)
Report given to Mina, RN 

## 2021-02-27 NOTE — ED Notes (Signed)
Pt just finished eating dinner tray. Insulin given. Pt readjusted in bed and lights dimmed again. Pt says she feels like she needs to urinate. I told her the plan of when I spoke to the doctor (we will re-scan her at 2300 and if she's still retaining then probably do a foley catheter). Pt says that sounds good and denies further needs.

## 2021-02-28 ENCOUNTER — Inpatient Hospital Stay (HOSPITAL_COMMUNITY): Payer: Self-pay

## 2021-02-28 DIAGNOSIS — I4891 Unspecified atrial fibrillation: Secondary | ICD-10-CM

## 2021-02-28 LAB — BASIC METABOLIC PANEL
Anion gap: 15 (ref 5–15)
BUN: 21 mg/dL (ref 8–23)
CO2: 17 mmol/L — ABNORMAL LOW (ref 22–32)
Calcium: 8 mg/dL — ABNORMAL LOW (ref 8.9–10.3)
Chloride: 101 mmol/L (ref 98–111)
Creatinine, Ser: 0.83 mg/dL (ref 0.44–1.00)
GFR, Estimated: >60 mL/min (ref >60)
Glucose, Bld: 189 mg/dL — ABNORMAL HIGH (ref 70–99)
Potassium: 3.9 mmol/L (ref 3.5–5.1)
Sodium: 133 mmol/L — ABNORMAL LOW (ref 135–145)

## 2021-02-28 LAB — CBC
HCT: 27.4 % — ABNORMAL LOW (ref 36.0–46.0)
Hemoglobin: 9.2 g/dL — ABNORMAL LOW (ref 12.0–15.0)
MCH: 29.6 pg (ref 26.0–34.0)
MCHC: 33.6 g/dL (ref 30.0–36.0)
MCV: 88.1 fL (ref 80.0–100.0)
Platelets: 106 10*3/uL — ABNORMAL LOW (ref 150–400)
RBC: 3.11 MIL/uL — ABNORMAL LOW (ref 3.87–5.11)
RDW: 13.6 % (ref 11.5–15.5)
WBC: 8.6 10*3/uL (ref 4.0–10.5)
nRBC: 0 % (ref 0.0–0.2)

## 2021-02-28 LAB — GLUCOSE, CAPILLARY
Glucose-Capillary: 160 mg/dL — ABNORMAL HIGH (ref 70–99)
Glucose-Capillary: 235 mg/dL — ABNORMAL HIGH (ref 70–99)
Glucose-Capillary: 243 mg/dL — ABNORMAL HIGH (ref 70–99)
Glucose-Capillary: 188 mg/dL — ABNORMAL HIGH (ref 70–99)

## 2021-02-28 LAB — MRSA NEXT GEN BY PCR, NASAL: MRSA by PCR Next Gen: NOT DETECTED

## 2021-02-28 LAB — ECHOCARDIOGRAM COMPLETE
Height: 63 in
S' Lateral: 2.8 cm
Weight: 2897.6 oz

## 2021-02-28 LAB — MAGNESIUM
Magnesium: 1.5 mg/dL — ABNORMAL LOW (ref 1.7–2.4)
Magnesium: 1.4 mg/dL — ABNORMAL LOW (ref 1.7–2.4)

## 2021-02-28 MED ORDER — METOPROLOL TARTRATE 25 MG PO TABS
25.0000 mg | ORAL_TABLET | Freq: Four times a day (QID) | ORAL | Status: DC
  Administered 2021-02-28 – 2021-03-01 (×4): 25 mg via ORAL
  Filled 2021-02-28 (×4): qty 1

## 2021-02-28 MED ORDER — CHLORHEXIDINE GLUCONATE CLOTH 2 % EX PADS: 6.0000 | MEDICATED_PAD | Freq: Every day | CUTANEOUS | Status: DC

## 2021-02-28 MED ORDER — MAGNESIUM SULFATE 2 GM/50ML IV SOLN
2.0000 g | Freq: Once | INTRAVENOUS | Status: AC
  Administered 2021-02-28: 2 g via INTRAVENOUS
  Filled 2021-02-28: qty 50

## 2021-02-28 MED ORDER — CHLORHEXIDINE GLUCONATE CLOTH 2 % EX PADS
6.0000 | MEDICATED_PAD | Freq: Every day | CUTANEOUS | Status: DC
  Administered 2021-02-28 – 2021-03-01 (×2): 6 via TOPICAL

## 2021-02-28 NOTE — Plan of Care (Signed)
  Problem: Fluid Volume: Goal: Hemodynamic stability will improve Outcome: Progressing   Problem: Clinical Measurements: Goal: Signs and symptoms of infection will decrease Outcome: Progressing   Problem: Respiratory: Goal: Ability to maintain adequate ventilation will improve Outcome: Progressing   

## 2021-02-28 NOTE — Progress Notes (Signed)
Family Medicine Teaching Service Daily Progress Note Intern Pager: [phone]  Patient name: Emily Farmer Medical record number: [id number] Date of birth: 05/18/1956 Age: 65 y.o. Gender: female  Primary Care Provider: Antony Blackbird, MD (Inactive) Consultants: Cardio Code Status: Full   Pt Overview and Major Events to Date:  02/26/21 Admitted to [place]  02/26/21 Found to have pyelonephritis   Assessment and Plan:  Emily Farmer is a 65 year old female presenting with sepsis likely secondary to pyelonephritis.  Past medical history significant for uncontrolled DM 2, chronic A. fib, HFpEF, HLD, IDA.   Sepsis criteria met likely secondary to pyelonephritis Heart rate is still elevated to the 110s likely secondary to A. fib.  She has not had a fever and blood pressure is 133/93. Relevant Labs/Imaging: Mag 1.4, echo not yet completed.  -VS per floor protocol -CTX day #2 -Maintenance fluids  -tylenol 650 q6hr  -Up with assistance  -Monitor CBCs and CMPs  Hyperglycemia with h/o uncontrolled DM2 Updated A1C 12.8. CBGs [phone].  -On sSSI  -Semglee 24U daily   Hypomagnesemia  Magnesium 1.4, replenished. Repeat mag pending. 4g given to replenish.   Afib with RVR Pulse remains 110s. Was in Afib in room. Pt is on metoprolol tartrate 68m 4xdaily and eliquis  -Cardiology following, appreciate recs  -Contingency plan for amiodarone  -Cardiology is increasing metoprolol given pressures   Urinary Retention Three in and out caths, placed foley overnight. Avoid anticholinergics due to possibility of neuropathy causing he retention.  -Voiding trial tomorrow   HFpEF Updated echo performed but not resulted. Last echo 2021 showed EF of 60-65%, Grade 2DD. Dry weight 170, weight today is 181.1. Output +503. SPO2 on RA 93%.  -Consider SGLT2 inhibitor outpatient once HA1C has improved  -Await updated echo   AKI, resolved  Creatinine 0.83 from 1.37. GFR >60.  -Fluids as appropriate   H/o  IDA Ferrous sulfate 325 mg daily with breakfast. Continue medication. Average Hgb 12-13. Updated Hgb 11.3>9.2. Colonoscopy in 2020-No source of IDA  -Continue iron supplementation  -Continue with monitoring CBC  HLD On home medications of zetia 156mdaily and lipitor 4015maily   FEN/GI: Heart healthy  PPx: Eliquis  Dispo:Home pending clinical improvement . Barriers include clinical improvement.   Subjective:  Pt reports that she is doing alright today. She notes of some intermittent abdominal pain. She was ordering breakfast when I went to see her and had sister at bedside.   Objective: Temp:  [97.9 F (36.6 C)-99.2 F (37.3 C)] 98.2 F (36.8 C) (09/15 1139) Pulse Rate:  [106-130] 114 (09/15 1139) Resp:  [11-23] 22 (09/15 1139) BP: (97-141)/(59-93) 141/89 (09/15 1139) SpO2:  [91 %-98 %] 94 % (09/15 1139) Weight:  [82.1 kg] 82.1 kg (09/14 2136) Physical Exam Vitals reviewed.  Constitutional:      General: She is not in acute distress.    Appearance: She is well-developed. She is not ill-appearing.  Cardiovascular:     Rate and Rhythm: Tachycardia present. Rhythm irregular.  Pulmonary:     Effort: Pulmonary effort is normal. No tachypnea.     Breath sounds: Normal breath sounds.  Abdominal:     General: Bowel sounds are normal.     Palpations: Abdomen is soft.     Tenderness: There is no abdominal tenderness.  Skin:    General: Skin is warm and dry.  Neurological:     Mental Status: She is alert.      Laboratory: Recent Labs  Lab 02/26/21 1915 02/26/21 1924 02/27/21 0253  02/28/21 0613  WBC 5.7  --  15.8* 8.6  HGB 12.4 12.6  12.2 11.3* 9.2*  HCT 37.1 37.0  36.0 34.2* 27.4*  PLT 182  --  144* 106*   Recent Labs  Lab 02/26/21 1915 02/26/21 1924 02/27/21 0253 02/28/21 0613  NA 133* 133*  133* 131* 133*  K 3.8 3.8  3.8 3.6 3.9  CL 100 104 101 101  CO2 17*  --  18* 17*  BUN 29* 28* 27* 21  CREATININE 1.46* 1.30* 1.37* 0.83  CALCIUM 8.7*  --  8.2*  8.0*  PROT 6.4*  --  5.8*  --   BILITOT 1.0  --  1.1  --   ALKPHOS 105  --  90  --   ALT 12  --  14  --   AST 14*  --  20  --   GLUCOSE 473* 499* 469* 189*    Echo pending   Erskine Emery, MD 02/28/2021, 12:31 PM PGY-1, [place] Intern pager: [phone], text pages welcome

## 2021-02-28 NOTE — Progress Notes (Signed)
FPTS Interim Progress Note  S: Came to assess patient for night rounds. She is found alert and in no acute distress. She has no complaints. Her heart rate is from 120-130 on the monitor and is irregular upon auscultation and palpation. CBGs have been in the 100's.   O: BP 111/68 (BP Location: Left Arm)   Pulse (!) 130   Temp 98.8 F (37.1 C) (Oral)   Resp 20   Ht 5\' 3"  (1.6 m)   Wt 170 lb (77.1 kg)   SpO2 94%   BMI 30.11 kg/m   General: elderly female lying in bed CV: irregularly irregular rhythm, NMRG, strong peripheral pulse, warm extremities Resp: CTAB  A/P: -No changes at this time -Continue to watch HR closely -If patient develops RVR again, can give amio   PGY-1, Psychiatry Service pager [phone]

## 2021-02-28 NOTE — Plan of Care (Signed)
?  Problem: Clinical Measurements: ?Goal: Signs and symptoms of infection will decrease ?Outcome: Not Progressing ?  ?

## 2021-02-28 NOTE — Plan of Care (Signed)

## 2021-02-28 NOTE — Progress Notes (Signed)
Progress Note  Patient Name: Emily Farmer Date of Encounter: 02/28/2021  Primary Cardiologist:   Chrystie Nose, MD   Subjective   She is not complaining of SOB or chest pain.  She has bilateral shoulder pain.   Inpatient Medications    Scheduled Meds:  apixaban  5 mg Oral BID   atorvastatin  40 mg Oral Daily   Chlorhexidine Gluconate Cloth  6 each Topical Q0600   ezetimibe  10 mg Oral Daily   ferrous sulfate  325 mg Oral Q breakfast   insulin aspart  0-9 Units Subcutaneous TID WC   insulin glargine-yfgn  24 Units Subcutaneous Daily   metoprolol tartrate  25 mg Oral BID   Continuous Infusions:  sodium chloride 250 mL (02/27/21 2208)   cefTRIAXone (ROCEPHIN)  IV 2 g (02/27/21 2246)   lactated ringers 75 mL/hr at 02/27/21 2206   PRN Meds: sodium chloride, acetaminophen   Vital Signs    Vitals:   02/27/21 2327 02/28/21 0200 02/28/21 0300 02/28/21 0500  BP: 111/68 100/60 113/76 (!) 133/93  Pulse:   (!) 106 (!) 110  Resp: 20 15 20 18   Temp: 98.8 F (37.1 C) 97.9 F (36.6 C) 98.1 F (36.7 C) 98.2 F (36.8 C)  TempSrc: Oral Oral Oral Oral  SpO2: 94% 93% 91% 98%  Weight:      Height:        Intake/Output Summary (Last 24 hours) at 02/28/2021 0719 Last data filed at 02/28/2021 0336 Gross per 24 hour  Intake 2416.19 ml  Output 1650 ml  Net 766.19 ml   Filed Weights   02/26/21 1921 02/27/21 2136  Weight: 77.1 kg 82.1 kg    Telemetry    Atrial fib with rapid rate - Personally Reviewed  ECG    NA - Personally Reviewed  Physical Exam   GEN: No acute distress.   Neck: No  JVD Cardiac: Irregular RR, no murmurs, rubs, or gallops.  Respiratory: Clear  to auscultation bilaterally. GI: Soft, nontender, non-distended  MS: No  edema; No deformity. Neuro:  Nonfocal  Psych: Normal affect   Labs    Chemistry Recent Labs  Lab 02/26/21 1915 02/26/21 1924 02/27/21 0253  NA 133* 133*  133* 131*  K 3.8 3.8  3.8 3.6  CL 100 104 101  CO2 17*  --  18*   GLUCOSE 473* 499* 469*  BUN 29* 28* 27*  CREATININE 1.46* 1.30* 1.37*  CALCIUM 8.7*  --  8.2*  PROT 6.4*  --  5.8*  ALBUMIN 3.2*  --  2.8*  AST 14*  --  20  ALT 12  --  14  ALKPHOS 105  --  90  BILITOT 1.0  --  1.1  GFRNONAA 40*  --  43*  ANIONGAP 16*  --  12     Hematology Recent Labs  Lab 02/26/21 1915 02/26/21 1924 02/27/21 0253  WBC 5.7  --  15.8*  RBC 4.20  --  3.81*  HGB 12.4 12.6  12.2 11.3*  HCT 37.1 37.0  36.0 34.2*  MCV 88.3  --  89.8  MCH 29.5  --  29.7  MCHC 33.4  --  33.0  RDW 12.9  --  13.2  PLT 182  --  144*    Cardiac EnzymesNo results for input(s): TROPONINI in the last 168 hours. No results for input(s): TROPIPOC in the last 168 hours.   BNPNo results for input(s): BNP, PROBNP in the last 168 hours.   DDimer  No results for input(s): DDIMER in the last 168 hours.   Radiology    DG Chest 1 View  Result Date: 02/26/2021 CLINICAL DATA:  Chest pain EXAM: CHEST  1 VIEW COMPARISON:  01/03/2021 FINDINGS: Lungs are clear.  No pleural effusion or pneumothorax. The heart is normal in size. Surgical clips overlying the right chest wall. IMPRESSION: No evidence of acute cardiopulmonary disease. Electronically Signed   By: Charline Bills M.D.   On: 02/26/2021 20:31   CT ABDOMEN PELVIS W CONTRAST  Result Date: 02/26/2021 CLINICAL DATA:  Acute abdominal pain. EXAM: CT ABDOMEN AND PELVIS WITH CONTRAST TECHNIQUE: Multidetector CT imaging of the abdomen and pelvis was performed using the standard protocol following bolus administration of intravenous contrast. CONTRAST:  3mL OMNIPAQUE IOHEXOL 350 MG/ML SOLN COMPARISON:  03/14/2018 FINDINGS: Lower chest: Stable postoperative changes at the right lung base. No acute pulmonary findings or pleural effusion. Age advanced three-vessel coronary artery calcifications are noted. The heart is normal in size. No pericardial effusion. Hepatobiliary: No hepatic lesions or intrahepatic biliary dilatation. The gallbladder is  surgically absent. No common bile duct dilatation. Pancreas: No mass, inflammation or ductal dilatation. Spleen: Normal size.  No focal lesions. Adrenals/Urinary Tract: Adrenal glands are normal. No renal lesions or hydroureteronephrosis. Stable renal cortical scarring changes involving the right kidney. The lower pole region of the left kidney demonstrates an area of decreased perfusion and a small amount adjacent perinephric interstitial change. Findings could be due to focal pyelonephritis. Recommend correlation with any left flank pain and recommend correlation with urinalysis. The bladder is unremarkable. No bladder wall thickening or bladder calculi. Stomach/Bowel: The stomach, duodenum, small bowel and colon are unremarkable. No acute inflammatory changes, mass lesions or obstructive findings. The terminal ileum and appendix are normal. Vascular/Lymphatic: Minimal scattered atherosclerotic calcifications involving the aorta and iliac arteries but no aneurysm or dissection. The branch vessels are patent. The major venous structures are patent. No mesenteric or retroperitoneal mass or adenopathy. Reproductive: The uterus and ovaries are unremarkable. Other: Stable rim calcified structure near the dome of the bladder. Stable periumbilical abdominal wall hernia containing fat and also part of the transverse colon. Musculoskeletal: No significant bony findings. IMPRESSION: 1. CT findings suspicious for focal pyelonephritis involving the lower pole region of the left kidney. Recommend correlation with any left flank pain and recommend correlation with urinalysis. 2. No other significant abdominal/pelvic findings, mass lesions or adenopathy. 3. Status post cholecystectomy. No biliary dilatation. 4. Stable periumbilical abdominal wall hernia containing fat and part of the transverse colon. 5. Age advanced three-vessel coronary artery calcifications. Electronically Signed   By: Rudie Meyer M.D.   On: 02/26/2021  21:41    Cardiac Studies   Echo:  Pending.   Patient Profile     65 y.o. female with a hx of HTN, HLD, DM II, pancreatitis and a history of persistent atrial fibrillation who is being seen 02/27/2021 for the evaluation of atrial fibrillation with RVR in the setting of pyelonephritis and possible septic shock at the request of Dr. McDiarmid.  Assessment & Plan    Atrial fibrillation with RVR: Rate is elevated.  I will increase the beta blocker has her BP has rebounded.    Pyelonephritis:  Per the primary team.     Chest pain:  Mildly elevated troponin secondary to demand ischemia.  Echo pending but prelim looks OK and no ongoing chest pain.  No further ischemia work up is likely.     Septic shock:  BP is improved.  See below.     Hypertension:  She has tolerated the metoprolol.  Increase as above.      For questions or updates, please contact [facility] Please consult [email or web address] for contact info under Cardiology/STEMI.   Signed, Rollene Rotunda, MD  02/28/2021, 7:19 AM

## 2021-02-28 NOTE — Progress Notes (Signed)
  Echocardiogram 2D Echocardiogram has been performed.  Delcie Roch 02/28/2021, 11:12 AM

## 2021-02-28 NOTE — Progress Notes (Addendum)
FPTS Interim Progress Note  S: Went to evaluate patient at the start of shift.  Found patient resting comfortably in bed with no complaints.  O: BP 133/88 (BP Location: Left Arm)   Pulse (!) 105   Temp 98.5 F (36.9 C) (Oral)   Resp 20   Ht 5\' 3"  (1.6 m)   Wt 82.1 kg   SpO2 94%   BMI 32.08 kg/m    General: Alert and oriented in no apparent distress Heart: Mildly tachycardic rate with irregular rhythm  Lungs: No respiratory distress  A/P: Her rate seems improved with a heart rate of 105 at the time of my exam.  She has no concerns or complaints at this time.  Blood pressure is much improved from shift last night.  , DO 02/28/2021, 8:47 PM PGY-3, [facility] Family Medicine Service pager [phone]

## 2021-03-01 ENCOUNTER — Ambulatory Visit: Payer: Medicaid Other | Admitting: Internal Medicine

## 2021-03-01 DIAGNOSIS — I5032 Chronic diastolic (congestive) heart failure: Secondary | ICD-10-CM

## 2021-03-01 DIAGNOSIS — N179 Acute kidney failure, unspecified: Secondary | ICD-10-CM

## 2021-03-01 DIAGNOSIS — A419 Sepsis, unspecified organism: Principal | ICD-10-CM

## 2021-03-01 DIAGNOSIS — I959 Hypotension, unspecified: Secondary | ICD-10-CM

## 2021-03-01 LAB — CULTURE, BLOOD (ROUTINE X 2)

## 2021-03-01 LAB — BASIC METABOLIC PANEL
Anion gap: 10 (ref 5–15)
BUN: 17 mg/dL (ref 8–23)
CO2: 20 mmol/L — ABNORMAL LOW (ref 22–32)
Calcium: 8.3 mg/dL — ABNORMAL LOW (ref 8.9–10.3)
Chloride: 101 mmol/L (ref 98–111)
Creatinine, Ser: 0.83 mg/dL (ref 0.44–1.00)
GFR, Estimated: >60 mL/min (ref >60)
Glucose, Bld: 196 mg/dL — ABNORMAL HIGH (ref 70–99)
Potassium: 3.7 mmol/L (ref 3.5–5.1)
Sodium: 131 mmol/L — ABNORMAL LOW (ref 135–145)

## 2021-03-01 LAB — CBC
HCT: 31.5 % — ABNORMAL LOW (ref 36.0–46.0)
Hemoglobin: 10.7 g/dL — ABNORMAL LOW (ref 12.0–15.0)
MCH: 29.6 pg (ref 26.0–34.0)
MCHC: 34 g/dL (ref 30.0–36.0)
MCV: 87.3 fL (ref 80.0–100.0)
Platelets: 148 10*3/uL — ABNORMAL LOW (ref 150–400)
RBC: 3.61 MIL/uL — ABNORMAL LOW (ref 3.87–5.11)
RDW: 13.4 % (ref 11.5–15.5)
WBC: 8.9 10*3/uL (ref 4.0–10.5)
nRBC: 0 % (ref 0.0–0.2)

## 2021-03-01 LAB — URINE CULTURE: Culture: 60000 — AB

## 2021-03-01 LAB — GLUCOSE, CAPILLARY
Glucose-Capillary: 220 mg/dL — ABNORMAL HIGH (ref 70–99)
Glucose-Capillary: 285 mg/dL — ABNORMAL HIGH (ref 70–99)
Glucose-Capillary: 197 mg/dL — ABNORMAL HIGH (ref 70–99)
Glucose-Capillary: 245 mg/dL — ABNORMAL HIGH (ref 70–99)

## 2021-03-01 LAB — MAGNESIUM: Magnesium: 1.8 mg/dL (ref 1.7–2.4)

## 2021-03-01 MED ORDER — SODIUM CHLORIDE 0.9 % IV SOLN
250.0000 mL | INTRAVENOUS | Status: DC | PRN
  Administered 2021-03-01: 250 mL via INTRAVENOUS

## 2021-03-01 MED ORDER — METOPROLOL TARTRATE 50 MG PO TABS
50.0000 mg | ORAL_TABLET | Freq: Three times a day (TID) | ORAL | Status: DC
  Administered 2021-03-01 (×2): 50 mg via ORAL
  Filled 2021-03-01 (×2): qty 1

## 2021-03-01 MED ORDER — INSULIN ASPART 100 UNIT/ML IJ SOLN
0.0000 [IU] | Freq: Three times a day (TID) | INTRAMUSCULAR | Status: DC
  Administered 2021-03-01: 8 [IU] via SUBCUTANEOUS
  Administered 2021-03-01: 3 [IU] via SUBCUTANEOUS
  Administered 2021-03-02: 11 [IU] via SUBCUTANEOUS
  Administered 2021-03-02 (×2): 8 [IU] via SUBCUTANEOUS
  Administered 2021-03-03: 2 [IU] via SUBCUTANEOUS
  Administered 2021-03-03 (×2): 5 [IU] via SUBCUTANEOUS

## 2021-03-01 MED ORDER — POLYETHYLENE GLYCOL 3350 17 G PO PACK
17.0000 g | PACK | Freq: Every day | ORAL | Status: DC
  Administered 2021-03-01: 17 g via ORAL
  Filled 2021-03-01 (×2): qty 1

## 2021-03-01 MED ORDER — SODIUM CHLORIDE 0.9% FLUSH
3.0000 mL | INTRAVENOUS | Status: DC | PRN
Start: 2021-03-01 — End: 2021-03-03

## 2021-03-01 MED ORDER — MAGNESIUM SULFATE 2 GM/50ML IV SOLN
2.0000 g | Freq: Once | INTRAVENOUS | Status: AC
  Administered 2021-03-01: 2 g via INTRAVENOUS
  Filled 2021-03-01: qty 50

## 2021-03-01 MED ORDER — FUROSEMIDE 10 MG/ML IJ SOLN
20.0000 mg | Freq: Once | INTRAMUSCULAR | Status: AC
  Administered 2021-03-01: 20 mg via INTRAVENOUS
  Filled 2021-03-01: qty 2

## 2021-03-01 MED ORDER — SODIUM CHLORIDE 0.9% FLUSH
3.0000 mL | Freq: Two times a day (BID) | INTRAVENOUS | Status: DC
  Administered 2021-03-01 – 2021-03-02 (×3): 3 mL via INTRAVENOUS

## 2021-03-01 NOTE — Plan of Care (Signed)
  Problem: Fluid Volume: Goal: Hemodynamic stability will improve Outcome: Progressing   Problem: Fluid Volume: Goal: Hemodynamic stability will improve 03/01/2021 2221 by Luna Kitchens, RN Outcome: Progressing 03/01/2021 2221 by Luna Kitchens, RN Outcome: Progressing   Problem: Clinical Measurements: Goal: Diagnostic test results will improve 03/01/2021 2221 by Luna Kitchens, RN Outcome: Progressing 03/01/2021 2221 by Luna Kitchens, RN Outcome: Progressing Goal: Signs and symptoms of infection will decrease 03/01/2021 2221 by Luna Kitchens, RN Outcome: Progressing 03/01/2021 2221 by Luna Kitchens, RN Outcome: Progressing   Problem: Respiratory: Goal: Ability to maintain adequate ventilation will improve 03/01/2021 2221 by Luna Kitchens, RN Outcome: Progressing 03/01/2021 2221 by Luna Kitchens, RN Outcome: Progressing   Problem: Education: Goal: Knowledge of General Education information will improve Description: Including pain rating scale, medication(s)/side effects and non-pharmacologic comfort measures Outcome: Progressing   Problem: Health Behavior/Discharge Planning: Goal: Ability to manage health-related needs will improve Outcome: Progressing   Problem: Clinical Measurements: Goal: Ability to maintain clinical measurements within normal limits will improve Outcome: Progressing Goal: Will remain free from infection Outcome: Progressing Goal: Diagnostic test results will improve Outcome: Progressing Goal: Respiratory complications will improve Outcome: Progressing Goal: Cardiovascular complication will be avoided Outcome: Progressing   Problem: Activity: Goal: Risk for activity intolerance will decrease Outcome: Progressing   Problem: Nutrition: Goal: Adequate nutrition will be maintained Outcome: Progressing   Problem: Coping: Goal: Level of anxiety will decrease Outcome: Progressing   Problem: Elimination: Goal: Will not experience complications related  to bowel motility Outcome: Progressing Goal: Will not experience complications related to urinary retention Outcome: Progressing   Problem: Pain Managment: Goal: General experience of comfort will improve Outcome: Progressing   Problem: Safety: Goal: Ability to remain free from injury will improve Outcome: Progressing   Problem: Skin Integrity: Goal: Risk for impaired skin integrity will decrease Outcome: Progressing

## 2021-03-01 NOTE — Progress Notes (Signed)
FPTS Brief Progress Note  S: Resting in bed comfortably.    O: BP (!) 155/94 (BP Location: Left Arm)   Pulse 100   Temp 98.7 F (37.1 C) (Oral)   Resp 19   Ht 5\' 3"  (1.6 m)   Wt 82 kg   SpO2 93%   BMI 32.01 kg/m     A/P: Sepsis Likely 2/2 to Pyelonephritis, Hx of Afib - Orders reviewed. Labs for AM ordered, which was adjusted as needed.  - If condition changes, plan includes contacting cardiology if heart rate increased and consider IV lopressor.   , DO 03/01/2021, 9:58 PM PGY-3,  Family Medicine Night Resident  Please page [phone] with questions.

## 2021-03-01 NOTE — TOC Progression Note (Signed)
Transition of Care [facility]) - Progression Note    Patient Details  Name: Emily Farmer MRN: [id number] Date of Birth: September 30, 1955  Transition of Care [facility]) CM/SW Contact  Leone Haven, RN Phone Number: 03/01/2021, 6:40 PM  Clinical Narrative:    from home, sepsis secondary to pyleo, afib with rvr. TOC team to continue to follow.        Expected Discharge Plan and Services                                                 Social Determinants of Health (SDOH) Interventions    Readmission Risk Interventions No flowsheet data found.

## 2021-03-01 NOTE — Progress Notes (Addendum)
Inpatient Diabetes Program Recommendations  AACE/ADA: New Consensus Statement on Inpatient Glycemic Control (2015)  Target Ranges:  Prepandial:   less than 140 mg/dL      Peak postprandial:   less than 180 mg/dL (1-2 hours)      Critically ill patients:  140 - 180 mg/dL   Lab Results  Component Value Date   GLUCAP 220 (H) 03/01/2021   HGBA1C 12.8 (H) 02/27/2021    Review of Glycemic Control  Diabetes history: DM2 Outpatient Diabetes medications: Basaglar 24 units QHS, Humalog 10 units TID with meals, metformin 250 mg QID Current orders for Inpatient glycemic control: Semglee 24 units QD, Novolog 0-9 units TID with meals  Post-prandials elevated.  FBS 220 this am.  Inpatient Diabetes Program Recommendations:    Consider adding Novolog 3 units TID with meals if eating > 50% meal Increase Semglee to 25 units QD  Continue to follow.  Thank you. Ailene Ards, RD, LDN, CDE Inpatient Diabetes Coordinator [phone]

## 2021-03-01 NOTE — Progress Notes (Addendum)
Family Medicine Teaching Service Daily Progress Note Intern Pager: [phone]  Patient name: Emily Farmer Medical record number: [id number] Date of birth: 1956/01/02 Age: 65 y.o. Gender: female  Primary Care Provider: Cain Saupe, MD (Inactive) Consultants: Cardiology Code Status: Full  Pt Overview and Major Events to Date:  9/13 admitted, found to have pyelonephritis  Assessment and Plan:  64 year old female presenting with sepsis likely secondary to pyelonephritis. PMH significant to uncontrolled DM2, chronic Afib, HFpEF, HLD, IDA  Sepsis Likely 2/2 to Pyelonephritis HR has been low 100s-117 this AM likely 2/2 to A fib. Last fever was 100.5 on admission 9/13. BP has been 130s/90s, no hypotension. WBC of 8.9 today. Some soreness on left side when turning to left and palpation of this side.   -vitals per floor protocol -CTX day 3 consider transition to oral today or tomorrow - d/c LR IVF decreased to 30 mL/hr yesterday because of Echo results -tylenol 650 mg q6h -monitor CBC and CMP -walk as tolerated  Constipation No BM since Monday, some discomfort in belly button intermittently.  -Miralax daily added  DM2 A1c 12.8. CBGs have been 160-243 -increase to mSSI -Semglee 24U daily  HFpEF Echo showed EF 55-60%. RV more dilated and decreased function, and TR increased (mod-severe) with IVC dilated from Sept 2021 Echo. Last echo with 60-65% EF. Dry weight 170, weight today is 180.7 from 181 yesterday, down. UOP is 1L. No shortness of breath and saturating well on RA. -consider sglt2 inhibitor outpatient once A1c improved -d/c IVF -dose of lasix 20 IV given naive  Afib with RVR Pulse in low 100s was in Afib this AM when seeing her. -Cardiology following, appreciate reccommendations- metoprolol increased to 50 mg tid, eliquis 5mg  bid -contigency plan with amiodarone  Urinary Retention UOP 1L. Foley in place avoid anticholinergics due to possibility of neuropathy causing  retention -voiding trial today  Hypomagnesemia Repeat Mag of 1.8 from 1.4 yesterday.  -consider repletion today  AKI, resolved Cr of 0.83 from 0.83 yesterday.  Hx of Iron Deficiency Anemia Hgb has been 10.7 from 9.2 -hold ferrous sulfate due to nausea -monitor CBC  HLD -Zetia 10 mg daily -Lipitor 40 mg daily  FEN/GI: Regular Diet PPx: Eliquis Dispo:Home pending clinical improvement  Subjective:  No complaints from patient this morning except some abdominal pain near her belly button intermittently. Has not had BM since Monday. No chest pain, no feelings of palpitations.   Objective: Temp:  [98 F (36.7 C)-98.7 F (37.1 C)] 98 F (36.7 C) (09/16 0731) Pulse Rate:  [104-117] 114 (09/16 1000) Resp:  [13-24] 21 (09/16 1000) BP: (132-160)/(87-100) 132/92 (09/16 1000) SpO2:  [90 %-96 %] 92 % (09/16 1000) Weight:  [82 kg] 82 kg (09/16 0406) Physical Exam: General: NAD, speaking well Cardiovascular: Irregularly irregular, tachycardic Respiratory: no iWOB Abdomen: Mild tender to palpation on left flank, soft, bowel sounds present Extremities: Trace edema, nonpitting  Laboratory: Recent Labs  Lab 02/27/21 0253 02/28/21 0613 03/01/21 0024  WBC 15.8* 8.6 8.9  HGB 11.3* 9.2* 10.7*  HCT 34.2* 27.4* 31.5*  PLT 144* 106* 148*   Recent Labs  Lab 02/26/21 1915 02/26/21 1924 02/27/21 0253 02/28/21 0613 03/01/21 0024  NA 133*   < > 131* 133* 131*  K 3.8   < > 3.6 3.9 3.7  CL 100   < > 101 101 101  CO2 17*  --  18* 17* 20*  BUN 29*   < > 27* 21 17  CREATININE 1.46*   < >  1.37* 0.83 0.83  CALCIUM 8.7*  --  8.2* 8.0* 8.3*  PROT 6.4*  --  5.8*  --   --   BILITOT 1.0  --  1.1  --   --   ALKPHOS 105  --  90  --   --   ALT 12  --  14  --   --   AST 14*  --  20  --   --   GLUCOSE 473*   < > 469* 189* 196*   < > = values in this interval not displayed.    Imaging/Diagnostic Tests:   Levin Erp, MD 03/01/2021, 10:17 AM PGY-1, [facility] FPTS  Intern pager: [phone], text pages welcome

## 2021-03-01 NOTE — Plan of Care (Signed)

## 2021-03-01 NOTE — Progress Notes (Signed)
Progress Note  Patient Name: Emily Farmer Date of Encounter: 03/01/2021  Primary Cardiologist:   Chrystie Nose, MD   Subjective   No pain.  No SOB.    Inpatient Medications    Scheduled Meds:  apixaban  5 mg Oral BID   atorvastatin  40 mg Oral Daily   Chlorhexidine Gluconate Cloth  6 each Topical Q0600   ezetimibe  10 mg Oral Daily   ferrous sulfate  325 mg Oral Q breakfast   insulin aspart  0-9 Units Subcutaneous TID WC   insulin glargine-yfgn  24 Units Subcutaneous Daily   metoprolol tartrate  25 mg Oral QID   Continuous Infusions:  sodium chloride Stopped (02/28/21 0700)   cefTRIAXone (ROCEPHIN)  IV 2 g (02/28/21 2149)   lactated ringers 30 mL/hr at 02/28/21 1559   PRN Meds: sodium chloride, acetaminophen   Vital Signs    Vitals:   02/28/21 2000 02/28/21 2353 03/01/21 0406 03/01/21 0731  BP: (!) 136/92 136/90 (!) 160/99 (!) 147/100  Pulse: (!) 111 (!) 106 (!) 108 (!) 117  Resp: (!) 24 17 13  (!) 22  Temp:  98.2 F (36.8 C) 98 F (36.7 C) 98 F (36.7 C)  TempSrc:  Oral Oral Oral  SpO2: 93% 90% 90% 92%  Weight:   82 kg   Height:        Intake/Output Summary (Last 24 hours) at 03/01/2021 0900 Last data filed at 03/01/2021 0700 Gross per 24 hour  Intake 813.82 ml  Output 1000 ml  Net -186.18 ml   Filed Weights   02/26/21 1921 02/27/21 2136 03/01/21 0406  Weight: 77.1 kg 82.1 kg 82 kg    Telemetry    Atrial fib with rapid rate- Personally Reviewed  ECG    NA - Personally Reviewed  Physical Exam   GEN: No  acute distress.   Neck: No  JVD Cardiac: Irregular RR, no murmurs, rubs, or gallops.  Respiratory: Clear to auscultation bilaterally. GI: Soft, nontender, non-distended, normal bowel sounds  MS:  Mild diffuse edema; No deformity. Neuro:   Nonfocal  Psych: Oriented and appropriate    Labs    Chemistry Recent Labs  Lab 02/26/21 1915 02/26/21 1924 02/27/21 0253 02/28/21 0613 03/01/21 0024  NA 133*   < > 131* 133* 131*  K 3.8    < > 3.6 3.9 3.7  CL 100   < > 101 101 101  CO2 17*  --  18* 17* 20*  GLUCOSE 473*   < > 469* 189* 196*  BUN 29*   < > 27* 21 17  CREATININE 1.46*   < > 1.37* 0.83 0.83  CALCIUM 8.7*  --  8.2* 8.0* 8.3*  PROT 6.4*  --  5.8*  --   --   ALBUMIN 3.2*  --  2.8*  --   --   AST 14*  --  20  --   --   ALT 12  --  14  --   --   ALKPHOS 105  --  90  --   --   BILITOT 1.0  --  1.1  --   --   GFRNONAA 40*  --  43* >60 >60  ANIONGAP 16*  --  12 15 10    < > = values in this interval not displayed.     Hematology Recent Labs  Lab 02/27/21 0253 02/28/21 0613 03/01/21 0024  WBC 15.8* 8.6 8.9  RBC 3.81* 3.11* 3.61*  HGB 11.3* 9.2*  10.7*  HCT 34.2* 27.4* 31.5*  MCV 89.8 88.1 87.3  MCH 29.7 29.6 29.6  MCHC 33.0 33.6 34.0  RDW 13.2 13.6 13.4  PLT 144* 106* 148*    Cardiac EnzymesNo results for input(s): TROPONINI in the last 168 hours. No results for input(s): TROPIPOC in the last 168 hours.   BNPNo results for input(s): BNP, PROBNP in the last 168 hours.   DDimer No results for input(s): DDIMER in the last 168 hours.   Radiology    ECHOCARDIOGRAM COMPLETE  Result Date: 02/28/2021    ECHOCARDIOGRAM REPORT   Patient Name:   Emily Farmer Date of Exam: 02/28/2021 Medical Rec #:  [id number]     Height:       63.0 in Accession #:    [id number]    Weight:       181.1 lb Date of Birth:  17-Feb-1956    BSA:          1.854 m Patient Age:    64 years      BP:           120/89 mmHg Patient Gender: F             HR:           114 bpm. Exam Location:  Inpatient Procedure: 2D Echo Indications:    atrial fibrillation  History:        Patient has prior history of Echocardiogram examinations, most                 recent 03/22/2020. CHF, Arrythmias:Atrial Fibrillation; Risk                 Factors:Diabetes, Dyslipidemia and Hypertension.  Sonographer:    Delcie Roch RDCS Referring Phys: [id number] TODD D MCDIARMID IMPRESSIONS  1. Compared to echo from Sept 2021, RV is dilated and function is down; TR is  increased, IVC dilated.  2. Left ventricular ejection fraction, by estimation, is 55 to 60%. The left ventricle has normal function. The left ventricle has no regional wall motion abnormalities. Left ventricular diastolic parameters are indeterminate.  3. Right ventricular systolic function is mildly reduced. The right ventricular size is mildly enlarged. Mildly increased right ventricular wall thickness. There is severely elevated pulmonary artery systolic pressure.  4. Mild mitral valve regurgitation.  5. Tricuspid valve regurgitation is moderate to severe.  6. The aortic valve is tricuspid. Aortic valve regurgitation is not visualized. Mild to moderate aortic valve sclerosis/calcification is present, without any evidence of aortic stenosis.  7. The inferior vena cava is dilated in size with <50% respiratory variability, suggesting right atrial pressure of 15 mmHg. FINDINGS  Left Ventricle: Left ventricular ejection fraction, by estimation, is 55 to 60%. The left ventricle has normal function. The left ventricle has no regional wall motion abnormalities. The left ventricular internal cavity size was normal in size. There is  no left ventricular hypertrophy. Left ventricular diastolic parameters are indeterminate. Right Ventricle: The right ventricular size is mildly enlarged. Mildly increased right ventricular wall thickness. Right ventricular systolic function is mildly reduced. There is severely elevated pulmonary artery systolic pressure. The tricuspid regurgitant velocity is 3.71 m/s, and with an assumed right atrial pressure of 5 mmHg, the estimated right ventricular systolic pressure is 60.1 mmHg. Left Atrium: Left atrial size was normal in size. Right Atrium: Right atrial size was normal in size. Pericardium: Trivial pericardial effusion is present. Mitral Valve: There is mild thickening of the mitral valve leaflet(s). Mild mitral valve regurgitation.  Tricuspid Valve: The tricuspid valve is normal in  structure. Tricuspid valve regurgitation is moderate to severe. Aortic Valve: The aortic valve is tricuspid. Aortic valve regurgitation is not visualized. Mild to moderate aortic valve sclerosis/calcification is present, without any evidence of aortic stenosis. Pulmonic Valve: The pulmonic valve was not well visualized. Pulmonic valve regurgitation is not visualized. Aorta: The aortic root is normal in size and structure. Venous: The inferior vena cava is dilated in size with less than 50% respiratory variability, suggesting right atrial pressure of 15 mmHg. IAS/Shunts: No atrial level shunt detected by color flow Doppler.  LEFT VENTRICLE PLAX 2D LVIDd:         3.90 cm LVIDs:         2.80 cm LV PW:         1.10 cm LV IVS:        1.10 cm LVOT diam:     1.60 cm LVOT Area:     2.01 cm  IVC IVC diam: 2.00 cm LEFT ATRIUM             Index LA diam:        4.00 cm 2.16 cm/m LA Vol (A2C):   57.0 ml 30.75 ml/m LA Vol (A4C):   48.0 ml 25.89 ml/m LA Biplane Vol: 52.4 ml 28.27 ml/m   AORTA Ao Root diam: 2.70 cm TRICUSPID VALVE TR Peak grad:   55.1 mmHg TR Vmax:        371.00 cm/s  SHUNTS Systemic Diam: 1.60 cm Dietrich Pates MD Electronically signed by Dietrich Pates MD Signature Date/Time: 02/28/2021/12:11:35 PM    Final     Cardiac Studies   Echo:    Compared to echo from Sept 2021, RV is dilated and function is down; TR is increased, IVC dilated. Left ventricular ejection fraction, by estimation, is 55 to 60%. The left ventricle has normal function. The left ventricle has no regional wall motion abnormalities. Left ventricular diastolic parameters are indeterminate. Right ventricular systolic function is mildly reduced. The right ventricular size is mildly enlarged. Mildly increased right ventricular wall thickness. There is severely elevated pulmonary artery systolic pressure. Mild mitral valve regurgitation. Tricuspid valve regurgitation is moderate to severe. The aortic valve is tricuspid. Aortic valve  regurgitation is not visualized. Mild to moderate aortic valve sclerosis/calcification is present, without any evidence of aortic stenosis. The inferior vena cava is dilated in size with <50% respiratory variability, suggesting right atrial pressure of 15 mmHg.  Patient Profile     65 y.o. female with a hx of HTN, HLD, DM II, pancreatitis and a history of persistent atrial fibrillation who is being seen 02/27/2021 for the evaluation of atrial fibrillation with RVR in the setting of pyelonephritis and possible septic shock at the request of Dr. McDiarmid.  Assessment & Plan    Atrial fibrillation with RVR: Rate is elevated.   Beta blocker increased yesterday.  Atrial fib has been permanent since last year.  We will continue with rate control and anticoagulation.  Interestingly she had bradycardia in the past.   I am going to change to 50 mg metoprolol tartrate tid.    Pyelonephritis:  Per the primary team.     Chest pain:  Mildly elevated troponin secondary to demand ischemia.  Normal echo.  No plan for invasive evaluation this admission.     Pulmonary HTN:  This is noted on the echo.  She can be managed with with BP control and rate control and repeat echo as an out  patient.  She did have severely elevated pulmonary pressures on previous echo.       Septic shock:  BP is improved.  See below.     Hypertension:  BP still elevated with increased heart rate.       For questions or updates, please contact [facility] Please consult [email or web address] for contact info under Cardiology/STEMI.   Signed, Rollene Rotunda, MD  03/01/2021, 9:00 AM

## 2021-03-01 NOTE — Progress Notes (Signed)
Patient up to void and have BM, post void scan 0 mls.

## 2021-03-02 ENCOUNTER — Encounter (HOSPITAL_COMMUNITY): Payer: Self-pay | Admitting: Family Medicine

## 2021-03-02 DIAGNOSIS — E876 Hypokalemia: Secondary | ICD-10-CM

## 2021-03-02 LAB — CBC
HCT: 32.6 % — ABNORMAL LOW (ref 36.0–46.0)
Hemoglobin: 10.9 g/dL — ABNORMAL LOW (ref 12.0–15.0)
MCH: 29.3 pg (ref 26.0–34.0)
MCHC: 33.4 g/dL (ref 30.0–36.0)
MCV: 87.6 fL (ref 80.0–100.0)
Platelets: 179 10*3/uL (ref 150–400)
RBC: 3.72 MIL/uL — ABNORMAL LOW (ref 3.87–5.11)
RDW: 13.2 % (ref 11.5–15.5)
WBC: 8.2 10*3/uL (ref 4.0–10.5)
nRBC: 0 % (ref 0.0–0.2)

## 2021-03-02 LAB — BASIC METABOLIC PANEL
Anion gap: 12 (ref 5–15)
BUN: 20 mg/dL (ref 8–23)
CO2: 25 mmol/L (ref 22–32)
Calcium: 8.4 mg/dL — ABNORMAL LOW (ref 8.9–10.3)
Chloride: 93 mmol/L — ABNORMAL LOW (ref 98–111)
Creatinine, Ser: 0.89 mg/dL (ref 0.44–1.00)
GFR, Estimated: >60 mL/min (ref >60)
Glucose, Bld: 283 mg/dL — ABNORMAL HIGH (ref 70–99)
Potassium: 3.4 mmol/L — ABNORMAL LOW (ref 3.5–5.1)
Sodium: 130 mmol/L — ABNORMAL LOW (ref 135–145)

## 2021-03-02 LAB — GLUCOSE, CAPILLARY
Glucose-Capillary: 252 mg/dL — ABNORMAL HIGH (ref 70–99)
Glucose-Capillary: 337 mg/dL — ABNORMAL HIGH (ref 70–99)
Glucose-Capillary: 255 mg/dL — ABNORMAL HIGH (ref 70–99)
Glucose-Capillary: 155 mg/dL — ABNORMAL HIGH (ref 70–99)

## 2021-03-02 LAB — MAGNESIUM: Magnesium: 1.7 mg/dL (ref 1.7–2.4)

## 2021-03-02 MED ORDER — POTASSIUM CHLORIDE CRYS ER 20 MEQ PO TBCR
40.0000 meq | EXTENDED_RELEASE_TABLET | Freq: Once | ORAL | Status: AC
  Administered 2021-03-02: 40 meq via ORAL
  Filled 2021-03-02: qty 2

## 2021-03-02 MED ORDER — POTASSIUM CHLORIDE CRYS ER 20 MEQ PO TBCR
20.0000 meq | EXTENDED_RELEASE_TABLET | Freq: Once | ORAL | Status: AC
  Administered 2021-03-02: 20 meq via ORAL
  Filled 2021-03-02: qty 1

## 2021-03-02 MED ORDER — TRAMADOL HCL 50 MG PO TABS
50.0000 mg | ORAL_TABLET | Freq: Once | ORAL | Status: AC
  Administered 2021-03-02: 50 mg via ORAL
  Filled 2021-03-02: qty 1

## 2021-03-02 MED ORDER — CEFDINIR 300 MG PO CAPS
300.0000 mg | ORAL_CAPSULE | Freq: Two times a day (BID) | ORAL | Status: DC
  Administered 2021-03-02 – 2021-03-03 (×4): 300 mg via ORAL
  Filled 2021-03-02 (×4): qty 1

## 2021-03-02 MED ORDER — MAGNESIUM SULFATE IN D5W 1-5 GM/100ML-% IV SOLN
1.0000 g | Freq: Once | INTRAVENOUS | Status: AC
  Administered 2021-03-02: 1 g via INTRAVENOUS
  Filled 2021-03-02: qty 100

## 2021-03-02 MED ORDER — METOPROLOL TARTRATE 50 MG PO TABS
50.0000 mg | ORAL_TABLET | Freq: Four times a day (QID) | ORAL | Status: DC
  Administered 2021-03-02 – 2021-03-03 (×5): 50 mg via ORAL
  Filled 2021-03-02 (×5): qty 1

## 2021-03-02 NOTE — Progress Notes (Addendum)
Progress Note  Patient Name: Emily Farmer Date of Encounter: 03/02/2021  Primary Cardiologist: Chrystie Nose, MD  Subjective   No sense of palpitations or shortness of breath at rest.  Appetite good, no abdominal pain.  Inpatient Medications    Scheduled Meds:  apixaban  5 mg Oral BID   atorvastatin  40 mg Oral Daily   Chlorhexidine Gluconate Cloth  6 each Topical Q0600   ezetimibe  10 mg Oral Daily   ferrous sulfate  325 mg Oral Q breakfast   insulin aspart  0-15 Units Subcutaneous TID WC   insulin glargine-yfgn  24 Units Subcutaneous Daily   metoprolol tartrate  50 mg Oral TID   polyethylene glycol  17 g Oral Daily   sodium chloride flush  3 mL Intravenous Q12H   Continuous Infusions:  sodium chloride 250 mL (03/01/21 0700)   cefTRIAXone (ROCEPHIN)  IV 200 mL/hr at 03/02/21 0400   PRN Meds: sodium chloride, acetaminophen, sodium chloride flush   Vital Signs    Vitals:   03/01/21 1955 03/01/21 2200 03/01/21 2325 03/02/21 0231  BP: (!) 155/94 (!) 147/87 (!) 147/96 134/79  Pulse: 100 (!) 107 100 100  Resp: 19 14 17 17   Temp: 98.7 F (37.1 C)  98.3 F (36.8 C) 97.9 F (36.6 C)  TempSrc: Oral  Oral Oral  SpO2: 93% 91% 93% 96%  Weight:    80.3 kg  Height:        Intake/Output Summary (Last 24 hours) at 03/02/2021 0748 Last data filed at 03/02/2021 03/04/2021 Gross per 24 hour  Intake 2131.22 ml  Output 7150 ml  Net -5018.78 ml   Filed Weights   02/27/21 2136 03/01/21 0406 03/02/21 0231  Weight: 82.1 kg 82 kg 80.3 kg    Telemetry    Atrial fibrillation.  Personally reviewed.  ECG    No ECG reviewed.  Physical Exam   GEN: No acute distress.   Neck: No JVD. Cardiac: Irregularly irregular, no murmur, rub, or gallop.  Respiratory: Nonlabored. Clear to auscultation bilaterally. GI: Soft, nontender, bowel sounds present. MS: No edema; No deformity. Neuro:  Nonfocal. Psych: Alert and oriented x 3. Normal affect.  Labs    Chemistry Recent Labs  Lab  02/26/21 1915 02/26/21 1924 02/27/21 0253 02/28/21 0613 03/01/21 0024 03/02/21 0016  NA 133*   < > 131* 133* 131* 130*  K 3.8   < > 3.6 3.9 3.7 3.4*  CL 100   < > 101 101 101 93*  CO2 17*  --  18* 17* 20* 25  GLUCOSE 473*   < > 469* 189* 196* 283*  BUN 29*   < > 27* 21 17 20   CREATININE 1.46*   < > 1.37* 0.83 0.83 0.89  CALCIUM 8.7*  --  8.2* 8.0* 8.3* 8.4*  PROT 6.4*  --  5.8*  --   --   --   ALBUMIN 3.2*  --  2.8*  --   --   --   AST 14*  --  20  --   --   --   ALT 12  --  14  --   --   --   ALKPHOS 105  --  90  --   --   --   BILITOT 1.0  --  1.1  --   --   --   GFRNONAA 40*  --  43* >60 >60 >60  ANIONGAP 16*  --  12 15 10 12    < > =  values in this interval not displayed.     Hematology Recent Labs  Lab 02/28/21 0613 03/01/21 0024 03/02/21 0016  WBC 8.6 8.9 8.2  RBC 3.11* 3.61* 3.72*  HGB 9.2* 10.7* 10.9*  HCT 27.4* 31.5* 32.6*  MCV 88.1 87.3 87.6  MCH 29.6 29.6 29.3  MCHC 33.6 34.0 33.4  RDW 13.6 13.4 13.2  PLT 106* 148* 179    Cardiac Enzymes Recent Labs  Lab 02/26/21 1915 02/26/21 2150 02/27/21 0025  TROPONINIHS 17 31* 31*    Radiology    ECHOCARDIOGRAM COMPLETE  Result Date: 02/28/2021    ECHOCARDIOGRAM REPORT   Patient Name:   Emily Farmer Date of Exam: 02/28/2021 Medical Rec #:  [id number]     Height:       63.0 in Accession #:    [id number]    Weight:       181.1 lb Date of Birth:  April 17, 1956    BSA:          1.854 m Patient Age:    64 years      BP:           120/89 mmHg Patient Gender: F             HR:           114 bpm. Exam Location:  Inpatient Procedure: 2D Echo Indications:    atrial fibrillation  History:        Patient has prior history of Echocardiogram examinations, most                 recent 03/22/2020. CHF, Arrythmias:Atrial Fibrillation; Risk                 Factors:Diabetes, Dyslipidemia and Hypertension.  Sonographer:    Delcie Roch RDCS Referring Phys: [id number] TODD D MCDIARMID IMPRESSIONS  1. Compared to echo from Sept 2021, RV is  dilated and function is down; TR is increased, IVC dilated.  2. Left ventricular ejection fraction, by estimation, is 55 to 60%. The left ventricle has normal function. The left ventricle has no regional wall motion abnormalities. Left ventricular diastolic parameters are indeterminate.  3. Right ventricular systolic function is mildly reduced. The right ventricular size is mildly enlarged. Mildly increased right ventricular wall thickness. There is severely elevated pulmonary artery systolic pressure.  4. Mild mitral valve regurgitation.  5. Tricuspid valve regurgitation is moderate to severe.  6. The aortic valve is tricuspid. Aortic valve regurgitation is not visualized. Mild to moderate aortic valve sclerosis/calcification is present, without any evidence of aortic stenosis.  7. The inferior vena cava is dilated in size with <50% respiratory variability, suggesting right atrial pressure of 15 mmHg. FINDINGS  Left Ventricle: Left ventricular ejection fraction, by estimation, is 55 to 60%. The left ventricle has normal function. The left ventricle has no regional wall motion abnormalities. The left ventricular internal cavity size was normal in size. There is  no left ventricular hypertrophy. Left ventricular diastolic parameters are indeterminate. Right Ventricle: The right ventricular size is mildly enlarged. Mildly increased right ventricular wall thickness. Right ventricular systolic function is mildly reduced. There is severely elevated pulmonary artery systolic pressure. The tricuspid regurgitant velocity is 3.71 m/s, and with an assumed right atrial pressure of 5 mmHg, the estimated right ventricular systolic pressure is 60.1 mmHg. Left Atrium: Left atrial size was normal in size. Right Atrium: Right atrial size was normal in size. Pericardium: Trivial pericardial effusion is present. Mitral Valve: There is mild thickening of the mitral  valve leaflet(s). Mild mitral valve regurgitation. Tricuspid Valve: The  tricuspid valve is normal in structure. Tricuspid valve regurgitation is moderate to severe. Aortic Valve: The aortic valve is tricuspid. Aortic valve regurgitation is not visualized. Mild to moderate aortic valve sclerosis/calcification is present, without any evidence of aortic stenosis. Pulmonic Valve: The pulmonic valve was not well visualized. Pulmonic valve regurgitation is not visualized. Aorta: The aortic root is normal in size and structure. Venous: The inferior vena cava is dilated in size with less than 50% respiratory variability, suggesting right atrial pressure of 15 mmHg. IAS/Shunts: No atrial level shunt detected by color flow Doppler.  LEFT VENTRICLE PLAX 2D LVIDd:         3.90 cm LVIDs:         2.80 cm LV PW:         1.10 cm LV IVS:        1.10 cm LVOT diam:     1.60 cm LVOT Area:     2.01 cm  IVC IVC diam: 2.00 cm LEFT ATRIUM             Index LA diam:        4.00 cm 2.16 cm/m LA Vol (A2C):   57.0 ml 30.75 ml/m LA Vol (A4C):   48.0 ml 25.89 ml/m LA Biplane Vol: 52.4 ml 28.27 ml/m   AORTA Ao Root diam: 2.70 cm TRICUSPID VALVE TR Peak grad:   55.1 mmHg TR Vmax:        371.00 cm/s  SHUNTS Systemic Diam: 1.60 cm Dietrich Pates MD Electronically signed by Dietrich Pates MD Signature Date/Time: 02/28/2021/12:11:35 PM    Final     Assessment & Plan    1.  Permanent atrial fibrillation, CHA2DS2-VASc score of 4.  Currently on Eliquis for stroke prophylaxis.  Heart rate has been elevated in the setting of associated comorbidities including sepsis secondary to pyelonephritis and urinary retention.  Lopressor was increased most recently to 50 mg p.o. TID.  Blood pressure stable.  2.  Mild RV dilatation and mildly decreased contraction with associated severe pulmonary hypertension, RVSP estimated 60 mmHg.  Have known OSA managed with BiPAP per review of the chart.  No definite chronic lung disease, may need PFTs and rule out of chronic thromboembolic disease plus minus RHC as an outpatient eventually if not  already completed.  Seems out of proportion to degree of diastolic dysfunction but intermittent diuresis reasonable.  3.  Minimally elevated high-sensitivity troponin I of 31 and flat pattern not suggestive of ACS.  Most consistent with demand ischemia.  4.  Mixed hyperlipidemia on Lipitor and Zetia.  5.  Hypokalemia.  Blood pressure stable, will attempt increase in Lopressor to 50 mg p.o. every 6 hours, otherwise continue Eliquis.  Suspect reasonable heart rate control can be obtained without use of amiodarone.  Replete potassium.  Signed, Nona Dell, MD  03/02/2021, 7:48 AM

## 2021-03-02 NOTE — Plan of Care (Signed)

## 2021-03-02 NOTE — Progress Notes (Signed)
Family Medicine Teaching Service Daily Progress Note Intern Pager: [phone]  Patient name: Emily Farmer Medical record number: [id number] Date of birth: September 03, 1955 Age: 65 y.o. Gender: female  Primary Care Provider: Cain Saupe, MD (Inactive) Consultants: Cardiology Code Status: full  Pt Overview and Major Events to Date:  9/13 admitted, found to have pyelonephritis  Assessment and Plan:  65 year old female presenting with sepsis likely secondary to pyelonephritis. PMH significant to uncontrolled DM2, chronic Afib, HFpEF, HLD, IDA  Sepsis Likely 2/2 to Pyelonephritis HR was 100-124, likely 2/2 A fib. Last fever 100.5 on admission 9/13.  BP was 155/94-138/94 overnight, no episodes of hypotension.  WBC was 8.2 today.  -Na 130 [131] / K 3.4 [3.7] -Start oral antibiotics, Cefdinir today 4 more days (9/17-9/20), (CFTX 9/14-916), for full 7 day course -Tylenol 650 mg every 6 hours -Monitor CBC and CMP -Walk as tolerated  Constipation Patient had bowel movement overnight -Continue MiraLAX 35g daily  DM2 A1c 12.8 CBGs have been between 285-197, stable -Continue Semglee 24 units daily  HFpEF Most recent echo showed EF 55-60%, RV more dilated with decreased function. TR increased (mod-severe) with IVC dilated from Sept 2021 Echo. Last echo with 60-65% EF. Dry weight 170, weight today is 177 from 180.7 yesterday, down. UOP is 7.15L. No shortness of breath and saturating well on RA. -I: 1.68 L O: 7.15L -Consider sglt2 inhibitor outpatient once A1c improved   Afib with RVR Pulse was  100-124, patient in A. fib this morning Metoprolol tartrate 50 mg q 6  Urinary Retention Urine output 7.15L. Foley in place, avoid anticholinergics due to possible neuropathy causing retention  Hypomagnesemia Magnesium is 1.7 today from 1.8 yesterday. Stable   AKI, resolved Creatinine 0.89 today from 0.83 yesterday  Hx of Iron Deficiency Anemia Hemoglobin has been stable, 10.9 today [10.7  yesterday] -Hold ferrous sulfate due to nausea -Continue to monitor CBC  HLD -Continue Zetia 10 mg daily -Continue Lipitor 40 mg daily  Transfer her out of stepdown onto med telle  FEN/GI: Regular Diet PPx: Eliquis Dispo:Home pending clinical improvement .   Subjective:  Patient awake and pleasant this morning, reports bowel movement over night. Patient also reported stiff shoulder this morning, but has since resolved with no further concerns.   Objective: Temp:  [97.9 F (36.6 C)-98.7 F (37.1 C)] 97.9 F (36.6 C) (09/17 0231) Pulse Rate:  [100-124] 100 (09/17 0231) Resp:  [14-27] 17 (09/17 0231) BP: (130-155)/(79-96) 134/79 (09/17 0231) SpO2:  [91 %-96 %] 96 % (09/17 0231) Weight:  [80.3 kg] 80.3 kg (09/17 0231) Physical Exam: General: Well appearing, white woman, polite affect and good mood Cardiovascular: Irregularly irregular, no rubs, murmurs, or gallops Respiratory: CTABL, no wheezes or stridor Abdomen: Soft, minimal tenderness, non-distended Extremities: Pulses intact in all extremities, no edema in legs  Laboratory: Recent Labs  Lab 02/28/21 0613 03/01/21 0024 03/02/21 0016  WBC 8.6 8.9 8.2  HGB 9.2* 10.7* 10.9*  HCT 27.4* 31.5* 32.6*  PLT 106* 148* 179   Recent Labs  Lab 02/26/21 1915 02/26/21 1924 02/27/21 0253 02/28/21 0613 03/01/21 0024 03/02/21 0016  NA 133*   < > 131* 133* 131* 130*  K 3.8   < > 3.6 3.9 3.7 3.4*  CL 100   < > 101 101 101 93*  CO2 17*  --  18* 17* 20* 25  BUN 29*   < > 27* 21 17 20   CREATININE 1.46*   < > 1.37* 0.83 0.83 0.89  CALCIUM 8.7*  --  8.2* 8.0* 8.3* 8.4*  PROT 6.4*  --  5.8*  --   --   --   BILITOT 1.0  --  1.1  --   --   --   ALKPHOS 105  --  90  --   --   --   ALT 12  --  14  --   --   --   AST 14*  --  20  --   --   --   GLUCOSE 473*   < > 469* 189* 196* 283*   < > = values in this interval not displayed.     Imaging/Diagnostic Tests:   Bess Kinds, MD 03/02/2021, 8:20 AM PGY-1, [facility]  Family Medicine FPTS Intern pager: [phone], text pages welcome

## 2021-03-02 NOTE — Progress Notes (Signed)
FPTS Interim Progress Note  S:Patient sleeping and resting comfortably.  Spoke with RN who voice no concerns.  No orders required.    O: BP 107/67 (BP Location: Left Arm)   Pulse (!) 102   Temp 97.7 F (36.5 C) (Oral)   Resp 16   Ht 5\' 3"  (1.6 m)   Wt 80.3 kg   SpO2 94%   BMI 31.35 kg/m     A/P: No changes to current plan. See daily progress note.    , DO PGY-3, [facility] Family Medicine FPTS Intern pager [phone]

## 2021-03-02 NOTE — Evaluation (Signed)
Physical Therapy Evaluation Patient Details Name: Emily Farmer MRN: [id number] DOB: [birth year] Today's Date: 03/02/2021  History of Present Illness  The pt is a 65 yo female presenting 9/13 with c/o back pain, upon work up pt found to have hyperglycemia (BS > 600), and be in afib with RVR. Concern for sepsis due to pyelonephritis. PMH includes: uncontrolled DM II, a fib on eliquis, HFpEF, and HLD.   Clinical Impression  Pt in bed upon arrival of PT, agreeable to evaluation at this time. Prior to admission the pt was ambulating without need for AD, working part time, and walking 20-25 min to work. The pt now presents with limitations in functional mobility, activity tolerance, and dynamic stability due to above dx, and will continue to benefit from skilled PT to address these deficits. The pt was able to complete sit-stand transfers without UE support or evidence of instability. With hallway ambulation, the pt had x1 LOB requiring staggering steps and minA to recover, then used single UE support on IV pole. Will continue to benefit from skilled PT acutely to improve independence and balance with ambulation.         Recommendations for follow up therapy are one component of a multi-disciplinary discharge planning process, led by the attending physician.  Recommendations may be updated based on patient status, additional functional criteria and insurance authorization.  Follow Up Recommendations Home health PT    Equipment Recommendations  Cane    Recommendations for Other Services       Precautions / Restrictions Precautions Precautions: None Precaution Comments: watch HR Restrictions Weight Bearing Restrictions: No      Mobility  Bed Mobility Overal bed mobility: Independent                  Transfers Overall transfer level: Independent Equipment used: None             General transfer comment: No AD needed for in room  mobility/toileting.  Ambulation/Gait Ambulation/Gait assistance: Min guard;Min assist Gait Distance (Feet): 75 Feet Assistive device: None;IV Pole Gait Pattern/deviations: Step-through pattern;Staggering right Gait velocity: decreased Gait velocity interpretation: <1.31 ft/sec, indicative of household ambulator General Gait Details: pt with x1 instance of dizziness and needing 1-2 staggering steps to recover LOB, then reaching for single UE support on IV pole to steady for reutrn to room. HR 120-150bpm     Balance Overall balance assessment: Needs assistance Sitting-balance support: No upper extremity supported Sitting balance-Leahy Scale: Good     Standing balance support: No upper extremity supported Standing balance-Leahy Scale: Fair Standing balance comment: intermittently needing minA to steady                             Pertinent Vitals/Pain Pain Assessment: No/denies pain    Home Living Family/patient expects to be discharged to:: Private residence Living Arrangements: Other relatives (sister) Available Help at Discharge: Family;Available 24 hours/day Type of Home: Apartment Home Access: Level entry     Home Layout: One level Home Equipment: None      Prior Function Level of Independence: Independent         Comments: pt works part time at [employer], walks 25 min to work, requires x3 seated rest to make it to work     [employer]   Dominant Hand: Right    Extremity/Trunk Assessment   Upper Extremity Assessment Upper Extremity Assessment: Defer to OT evaluation    Lower Extremity Assessment Lower Extremity Assessment:  Overall [facility] for tasks assessed    Cervical / Trunk Assessment Cervical / Trunk Assessment: Normal  Communication   Communication: No difficulties  Cognition Arousal/Alertness: Awake/alert Behavior During Therapy: WFL for tasks assessed/performed Overall Cognitive Status: Within Functional Limits for tasks  assessed                                        General Comments General comments (skin integrity, edema, etc.): HR 120-150 bpm    Exercises     Assessment/Plan    PT Assessment Patient needs continued PT services  PT Problem List Decreased strength;Decreased range of motion;Decreased activity tolerance;Decreased balance;Decreased mobility;Decreased knowledge of use of DME       PT Treatment Interventions DME instruction;Stair training;Gait training;Functional mobility training;Therapeutic activities;Therapeutic exercise;Balance training;Patient/family education    PT Goals (Current goals can be found in the Care Plan section)  Acute Rehab PT Goals Patient Stated Goal: Get my HR back to normal PT Goal Formulation: With patient Time For Goal Achievement: 03/16/21 Potential to Achieve Goals: Good    Frequency Min 3X/week    AM-PAC PT "6 Clicks" Mobility  Outcome Measure Help needed turning from your back to your side while in a flat bed without using bedrails?: None Help needed moving from lying on your back to sitting on the side of a flat bed without using bedrails?: None Help needed moving to and from a bed to a chair (including a wheelchair)?: A Little Help needed standing up from a chair using your arms (e.g., wheelchair or bedside chair)?: A Little Help needed to walk in hospital room?: A Little Help needed climbing 3-5 steps with a railing? : A Little 6 Click Score: 20    End of Session Equipment Utilized During Treatment: Gait belt Activity Tolerance: Patient tolerated treatment well Patient left: in bed;with call bell/phone within reach Nurse Communication: Mobility status PT Visit Diagnosis: Other abnormalities of gait and mobility (R26.89)    Time: 6553-7482 PT Time Calculation (min) (ACUTE ONLY): 16 min   Charges:   PT Evaluation $PT Eval Low Complexity: 1 Low          Vickki Muff, PT, DPT   Acute Rehabilitation Department Pager #:  [phone]  Ronnie Derby 03/02/2021, 5:38 PM

## 2021-03-02 NOTE — Evaluation (Signed)
Occupational Therapy Evaluation Patient Details Name: Emily Farmer MRN: [id number] DOB: 05-02-1956 Today's Date: 03/02/2021   History of Present Illness 65 y.o. female with a hx of HTN, HLD, DM II, pancreatitis and a history of persistent atrial fibrillation who is being seen for the evaluation of atrial fibrillation with RVR in the setting of pyelonephritis and possible septic shock.   Clinical Impression   Patient admitted for the diagnosis above.  PTA she lives with her sister in a one level apartment.  She needs no assist with ADL/IADL, she takes public transportation, and walks to work - takes her about 15 min to walk to work.  HR is the only barrier, and no acute or post acute OT needs identified.  PT eval is pending.       Recommendations for follow up therapy are one component of a multi-disciplinary discharge planning process, led by the attending physician.  Recommendations may be updated based on patient status, additional functional criteria and insurance authorization.   Follow Up Recommendations  No OT follow up    Equipment Recommendations  Tub/shower seat    Recommendations for Other Services       Precautions / Restrictions Precautions Precautions: None Precaution Comments: fall last winter while walking to work in icy conditions.  Watch HR. Restrictions Weight Bearing Restrictions: No      Mobility Bed Mobility Overal bed mobility: Independent                  Transfers Overall transfer level: Independent               General transfer comment: No AD needed for in room mobility/toileting.    Balance Overall balance assessment: No apparent balance deficits (not formally assessed)                                         ADL either performed or assessed with clinical judgement   ADL Overall ADL's : At baseline                                             Vision Baseline Vision/History: 1 Wears  glasses Patient Visual Report: No change from baseline       Perception     Praxis      Pertinent Vitals/Pain Pain Assessment: No/denies pain     Hand Dominance Right   Extremity/Trunk Assessment Upper Extremity Assessment Upper Extremity Assessment: Overall WFL for tasks assessed   Lower Extremity Assessment Lower Extremity Assessment: Defer to PT evaluation   Cervical / Trunk Assessment Cervical / Trunk Assessment: Normal   Communication Communication Communication: No difficulties   Cognition Arousal/Alertness: Awake/alert Behavior During Therapy: WFL for tasks assessed/performed Overall Cognitive Status: Within Functional Limits for tasks assessed                                                      Home Living Family/patient expects to be discharged to:: Private residence Living Arrangements: Other relatives (sister) Available Help at Discharge: Family;Available 24 hours/day Type of Home: Apartment Home Access: Level entry     Home Layout: One level  Bathroom Shower/Tub: Doctor, general practice: None          Prior Functioning/Environment Level of Independence: Independent        Comments: works part time at [employer].        OT Problem List: Other (comment) (HR)      OT Treatment/Interventions:      OT Goals(Current goals can be found in the care plan section) Acute Rehab OT Goals Patient Stated Goal: Get my HR back to normal OT Goal Formulation: With patient Time For Goal Achievement: 03/02/21 Potential to Achieve Goals: Good  OT Frequency:     Barriers to D/C:  None noted          Co-evaluation              AM-PAC OT "6 Clicks" Daily Activity     Outcome Measure Help from another person eating meals?: None Help from another person taking care of personal grooming?: None Help from another person toileting, which includes using toliet, bedpan, or  urinal?: None Help from another person bathing (including washing, rinsing, drying)?: None Help from another person to put on and taking off regular upper body clothing?: None Help from another person to put on and taking off regular lower body clothing?: None 6 Click Score: 24   End of Session Nurse Communication: Mobility status  Activity Tolerance: Patient tolerated treatment well Patient left: in chair;with call bell/phone within reach  OT Visit Diagnosis: Other (comment) (HR)                Time: 0950-1010 OT Time Calculation (min): 20 min Charges:  OT General Charges $OT Visit: 1 Visit OT Evaluation $OT Eval Moderate Complexity: 1 Mod  03/02/2021  RP, OTR/L  Acute Rehabilitation Services  Office:  [phone]   Suzanna Obey 03/02/2021, 10:16 AM

## 2021-03-03 LAB — CBC
HCT: 32.6 % — ABNORMAL LOW (ref 36.0–46.0)
Hemoglobin: 11 g/dL — ABNORMAL LOW (ref 12.0–15.0)
MCH: 29.6 pg (ref 26.0–34.0)
MCHC: 33.7 g/dL (ref 30.0–36.0)
MCV: 87.9 fL (ref 80.0–100.0)
Platelets: 185 10*3/uL (ref 150–400)
RBC: 3.71 MIL/uL — ABNORMAL LOW (ref 3.87–5.11)
RDW: 13 % (ref 11.5–15.5)
WBC: 9.2 10*3/uL (ref 4.0–10.5)
nRBC: 0 % (ref 0.0–0.2)

## 2021-03-03 LAB — GLUCOSE, CAPILLARY
Glucose-Capillary: 144 mg/dL — ABNORMAL HIGH (ref 70–99)
Glucose-Capillary: 276 mg/dL — ABNORMAL HIGH (ref 70–99)
Glucose-Capillary: 206 mg/dL — ABNORMAL HIGH (ref 70–99)

## 2021-03-03 LAB — BASIC METABOLIC PANEL
Anion gap: 6 (ref 5–15)
BUN: 18 mg/dL (ref 8–23)
CO2: 30 mmol/L (ref 22–32)
Calcium: 8.6 mg/dL — ABNORMAL LOW (ref 8.9–10.3)
Chloride: 97 mmol/L — ABNORMAL LOW (ref 98–111)
Creatinine, Ser: 0.98 mg/dL (ref 0.44–1.00)
GFR, Estimated: >60 mL/min (ref >60)
Glucose, Bld: 161 mg/dL — ABNORMAL HIGH (ref 70–99)
Potassium: 4.9 mmol/L (ref 3.5–5.1)
Sodium: 133 mmol/L — ABNORMAL LOW (ref 135–145)

## 2021-03-03 LAB — MAGNESIUM: Magnesium: 1.8 mg/dL (ref 1.7–2.4)

## 2021-03-03 MED ORDER — METOPROLOL SUCCINATE ER 100 MG PO TB24
100.0000 mg | ORAL_TABLET | Freq: Two times a day (BID) | ORAL | 1 refills | Status: DC
  Filled 2021-03-08: qty 60, 30d supply, fill #0

## 2021-03-03 MED ORDER — METOPROLOL SUCCINATE ER 100 MG PO TB24
100.0000 mg | ORAL_TABLET | Freq: Two times a day (BID) | ORAL | Status: DC
  Administered 2021-03-03: 100 mg via ORAL
  Filled 2021-03-03: qty 1

## 2021-03-03 MED ORDER — MAGNESIUM SULFATE 2 GM/50ML IV SOLN
2.0000 g | Freq: Once | INTRAVENOUS | Status: AC
  Administered 2021-03-03: 2 g via INTRAVENOUS
  Filled 2021-03-03: qty 50

## 2021-03-03 MED ORDER — INSULIN GLARGINE-YFGN 100 UNIT/ML ~~LOC~~ SOLN
28.0000 [IU] | Freq: Every day | SUBCUTANEOUS | Status: DC
  Administered 2021-03-03: 28 [IU] via SUBCUTANEOUS
  Filled 2021-03-03: qty 0.28

## 2021-03-03 MED ORDER — CEFDINIR 300 MG PO CAPS
300.0000 mg | ORAL_CAPSULE | Freq: Two times a day (BID) | ORAL | 0 refills | Status: AC
  Filled 2021-03-08: qty 6, 3d supply, fill #0

## 2021-03-03 NOTE — Discharge Instructions (Addendum)
Dear Emily Farmer,  Thank you for letting us participate in your care. You were hospitalized for an infection in your kidneys. This improved with antibiotics. You were also found to have an irregular and fast heart rate. You were evaluated by the Cardiologists who started you on a medication called Metoprolol to help control your heart rate. You were also found to have poorly controlled diabetes. We started you on insulin in the hospital and you were able to meet with diabetes educators to learn more about this serious disease and how to manage it. We are so glad that you are doing better. You should continue to take your antibiotic twice a day until 9/21.   POST-HOSPITAL & CARE INSTRUCTIONS Take Metoprolol tartrate 50 mg every 6 hours. This will help to control your heart rate.  Take Cefdinir twice a day until 9/21 Follow up with your cardiologist outpatient. They recommend you have a repeat ultrasound of your heart and do pulmonary testing. It is very important that you work to control your diabetes. Follow up with your primary care physician. Go to your follow up appointments (listed below)  DOCTOR'S APPOINTMENT   Call your Primary Care Physician's office for follow up.  Contact your Cardiologist for follow up.    Take care and be well!  Family Medicine Teaching Service Inpatient Team [place]  [facility]  [address] [place], [place] [address] [phone]

## 2021-03-03 NOTE — Hospital Course (Addendum)
Anitria Andon is a 65 y.o. female who was admitted to [facility] on 9/13 for sepsis 2/2 pyelonephritis. Her PMHx is notable for poorly controlled type 2 diabetes, permanent atrial fibrillation, HFpEF, hyperlipidemia severe pulmonary hypertension, iron deficiency anemia.  Below is her hospital course listed by problem.  Please refer to the H&P for additional information.  Sepsis 2/2 Pyelonephritis  Code sepsis called in the ED as patient was febrile to 100.5, heart rate ranging 120s to 140s, lactic acid of 3.0.  She was given fluids and started on broad-spectrum antibiotics of vancomycin, cefepime, and Flagyl.  CT abdomen pelvis significant for focal decreased perfusion in the left upper pole of the left kidney concerning for pyelonephritis.  Examination correlated with this finding as patient had lower back pain and left CVA tenderness.  Urinalysis notable for>500 glucose, moderate Hgb, positive nitrite, negative leukocyte. Urine culture grew 60,000 colonies of Klebsiella pneumonia, though antibiotics had been started prior to collection. She was transitioned to oral cefdinir on day before discharge to complete a total of 7 day course with antibiotics.  Permanent Atrial Fibrillation; A-fib with RVR Patient presented in RVR with heart rates in the 130s to 140s.  Cardiology was consulted during admission and she was started on metoprolol succinate.  This was changed to metoprolol tartrate and dose was titrated up to every 6 hours given persistence of elevated heart rate. HR stabilized at metoprolol tartrate 50 mg q6h. At discharge, cardiology said she can eventually convert to Toprol XL. She was continued on her home Eliquis.  Poorly Controlled Type 2 Diabetes Mellitus  Glucose on admission of >600.  Hemoglobin A1c 12.8.  She was started on Semglee and sliding scale insulin.  Diabetes educator was consulted.  Acute Urinary Retention On admission bladder scan with reported greater than 700 mL of  urine. Given that patient required multiple in and out catheterizations, a foley was placed. The foley was only needed temporarily and patient had a successful voiding trial. The retention was thought to be due to anticholinergic effects.   HFpEF  Severe Pulmonary HTN  Echo showed EF of 55 to 60%, compared from her prior echo in 02/2020 her RV was dilated and function was decreased, TR was increased and IVC was dilated.  She was also noted to have severely elevated pulmonary artery systolic pressure.  Cardiology recommended treatment not controlling blood pressure and intermittent diuresis.  She was recommended to have PFTs, repeat echo, +/- RHC outpatient.   Other conditions chronic and stable.

## 2021-03-03 NOTE — Progress Notes (Signed)
Family Medicine Teaching Service Daily Progress Note Intern Pager: [phone]  Patient name: Emily Farmer Medical record number: [id number] Date of birth: [birth year] Age: 65 y.o. Gender: female  Primary Care Provider: Cain Saupe, MD (Inactive) Consultants: None Code Status: Full  Pt Overview and Major Events to Date:  9/13: Admitted. Code sepsis; found to have pyelonephritis  9/17: Transitioned to cefdinir  Assessment and Plan: Emily Farmer is a 65 y.o. female who was admitted for sepsis from pyelonephritis. She has been stable and improving well on antibiotics. Her PMHx is significant for uncontrolled Type 2 DM, chronic atrial fibrillation, HFpEF, HLD and iron-deficiency anemia.   Pyelonephritis: Improving WBC 9.2, afebrile since 9/14. Back pain is much improved. Switched from CTX to cefdinir yesterday, 9/17.  -s/p Vancomycin x1 (9/13), Flagyl (9/13-9/14), CTX (9/13-9/17)  -Continue cefdinir (9/17-9/21) -Continue Tylenol 650 mg q6h PRN mild pain, fever  Permanent Atrial Fibrillation Metoprolol frequency increased from TID to q6h yesterday. HR low 100's. K 4.9, Mg 1.8. -Cardiology following, appreciate care and recommendations -Continue Metoprolol tartrate 50 mg q6h  -Continue Eliquis 5 mg BID -Optimize electroloytes, goal K >4, Mg >2 -Replete Mg with 2g Mag sulfate IV  HFpEF, EF 55-60%  Severe Pulmonary HTN BP ranging 107-142/67-96. Admission weight 170 lbs, now 176 lbs but net negative 600cc since admission.  -Cardiology following: recommend repeat echo, PFTs +/- RHC outpatient  -Continue Metoprolol tartrate 50 mg q6h  -Can intermittently diurese   Poorly Controlled Type 2 DM CBG ranging 155-337. Required 29U of SSI in last 24 hours.  -Increase Semglee to 28U  -Continue moderate SSI  -Consider SGLT-2 outpatient once more controlled   Hyperlipidemia: Chronic, stable -Continue Zetia and Lipitor   Constipation: Improved Last recorded bowel movement 9/16. -Continue  MiraLAX 17g daily  Iron-Deficiency Anemia Hgb stable, no concern for active bleed.  -Continue iron supplementation  FEN/GI: Regular diet PPx: Eliquis Dispo:Home with home health   possibly in next today or tomorrow .   Subjective:  Patient reports feeling better.  Her back pain has resolved.  Tolerated oral antibiotics without difficulty.  Hopeful to go home soon.  Objective: Temp:  [97.7 F (36.5 C)-98.5 F (36.9 C)] 97.8 F (36.6 C) (09/18 0000) Pulse Rate:  [79-113] 79 (09/18 0000) Resp:  [16-21] 18 (09/18 0000) BP: (107-139)/(67-94) 108/80 (09/18 0000) SpO2:  [93 %-98 %] 93 % (09/18 0000) Physical Exam: General: Awake, alert, no distress Cardiovascular: A. fib, heart rate low 100s Respiratory: CTA B, no wheezing/rhonchi/rales Abdomen: Soft, normoactive bowel sounds, nontender in all quadrants without rebound or guarding, nondistended Back: No CVA tenderness bilaterally Extremities: No edema  Laboratory: Recent Labs  Lab 03/01/21 0024 03/02/21 0016 03/03/21 0038  WBC 8.9 8.2 9.2  HGB 10.7* 10.9* 11.0*  HCT 31.5* 32.6* 32.6*  PLT 148* 179 185   Recent Labs  Lab 02/26/21 1915 02/26/21 1924 02/27/21 0253 02/28/21 0613 03/01/21 0024 03/02/21 0016 03/03/21 0038  NA 133*   < > 131*   < > 131* 130* 133*  K 3.8   < > 3.6   < > 3.7 3.4* 4.9  CL 100   < > 101   < > 101 93* 97*  CO2 17*  --  18*   < > 20* 25 30  BUN 29*   < > 27*   < > 17 20 18   CREATININE 1.46*   < > 1.37*   < > 0.83 0.89 0.98  CALCIUM 8.7*  --  8.2*   < > 8.3*  8.4* 8.6*  PROT 6.4*  --  5.8*  --   --   --   --   BILITOT 1.0  --  1.1  --   --   --   --   ALKPHOS 105  --  90  --   --   --   --   ALT 12  --  14  --   --   --   --   AST 14*  --  20  --   --   --   --   GLUCOSE 473*   < > 469*   < > 196* 283* 161*   < > = values in this interval not displayed.    Imaging/Diagnostic Tests: No results found.   Sabino Dick, DO 03/03/2021, 2:48 AM PGY-2, [facility] FPTS  Intern pager: [phone], text pages welcome

## 2021-03-03 NOTE — Discharge Summary (Signed)
[facility] Discharge Summary  Patient name: Emily Farmer Medical record number: [id number] Date of birth: 08-Sep-1955 Age: 65 y.o. Gender: female Date of Admission: 02/26/2021  Date of Discharge: 03/03/21 Admitting Physician: Blane Ohara McDiarmid, MD  Primary Care Provider: Antony Blackbird, MD (Inactive) Consultants: Cardiology   Indication for Hospitalization:  Sepsis 2/2 Pyelonephritis   Discharge Diagnoses/Problem List:  Principal Problem:   Sepsis [facility]) Active Problems:   Pyelonephritis, acute   Hypokalemia   Chronic a-fib (HCC)   Chronic diastolic CHF (congestive heart failure) ([place])   Atrial fibrillation with rapid ventricular response (HCC)   Hypotension   Hypomagnesemia   Disposition: Home with Home Health PT  Discharge Condition: Stable  Discharge Exam:  Blood pressure 110/82, pulse (!) 109, temperature 97.6 F (36.4 C), temperature source Oral, resp. rate 20, height _0  (1.6 m), weight 80.2 kg, SpO2 94 %. Physical Exam: General: Awake, alert, no distress Cardiovascular: A. fib, heart rate low 100s Respiratory: CTA B, no wheezing/rhonchi/rales Abdomen: Soft, normoactive bowel sounds, nontender in all quadrants without rebound or guarding, nondistended Back: No CVA tenderness bilaterally Extremities: No edema  Brief Hospital Course:  Emily Farmer is a 65 y.o. female who was admitted to [facility] on 9/13 for sepsis 2/2 pyelonephritis. Her PMHx is notable for poorly controlled type 2 diabetes, permanent atrial fibrillation, HFpEF, hyperlipidemia severe pulmonary hypertension, iron deficiency anemia.  Below is her hospital course listed by problem.  Please refer to the H&P for additional information.  Sepsis 2/2 Pyelonephritis  Code sepsis called in the ED as patient was febrile to 100.5, heart rate ranging 120s to 140s, lactic acid of 3.0.  She was given fluids and started on broad-spectrum antibiotics of vancomycin, cefepime, and  Flagyl.  CT abdomen pelvis significant for focal decreased perfusion in the left upper pole of the left kidney concerning for pyelonephritis.  Examination correlated with this finding as patient had lower back pain and left CVA tenderness.  Urinalysis notable for>500 glucose, moderate Hgb, positive nitrite, negative leukocyte. Urine culture grew 60,000 colonies of Klebsiella pneumonia, though antibiotics had been started prior to collection. She was transitioned to oral cefdinir on day before discharge to complete a total of 7 day course with antibiotics.  Permanent Atrial Fibrillation; A-fib with RVR Patient presented in RVR with heart rates in the 130s to 140s.  Cardiology was consulted during admission and she was started on metoprolol succinate.  This was changed to metoprolol tartrate and dose was titrated up to every 6 hours given persistence of elevated heart rate. HR stabilized at metoprolol tartrate 50 mg q6h. At discharge, cardiology said she can eventually convert to Toprol XL. She was continued on her home Eliquis.  Poorly Controlled Type 2 Diabetes Mellitus  Glucose on admission of >600.  Hemoglobin A1c 12.8.  She was started on Semglee and sliding scale insulin.  Diabetes educator was consulted.  Acute Urinary Retention On admission bladder scan with reported greater than 700 mL of urine. Given that patient required multiple in and out catheterizations, a foley was placed. The foley was only needed temporarily and patient had a successful voiding trial. The retention was thought to be due to anticholinergic effects.   HFpEF  Severe Pulmonary HTN  Echo showed EF of 55 to 60%, compared from her prior echo in 02/2020 her RV was dilated and function was decreased, TR was increased and IVC was dilated.  She was also noted to have severely elevated pulmonary artery systolic pressure.  Cardiology recommended treatment not controlling blood pressure and intermittent diuresis.  She was recommended  to have PFTs, repeat echo, +/- [place] outpatient.   Other conditions chronic and stable.   Issues for Follow Up:  Continue diabetes education; very poorly controlled Consider adding SGLT-2 when glucose is improved Recommended for repeat outpatient Echo, PFT's +/- RHC for her severe pulmonary HTN HTN control is important given her pulmonary HTN  Significant Procedures: None  Significant Labs and Imaging:  Recent Labs  Lab 03/01/21 0024 03/02/21 0016 03/03/21 0038  WBC 8.9 8.2 9.2  HGB 10.7* 10.9* 11.0*  HCT 31.5* 32.6* 32.6*  PLT 148* 179 185   Recent Labs  Lab 02/26/21 1915 02/26/21 1924 02/27/21 0253 02/27/21 2232 02/28/21 0122 02/28/21 0524 02/28/21 8182 03/01/21 0024 03/02/21 0016 03/03/21 0038  NA 133*   < > 131*  --   --   --  133* 131* 130* 133*  K 3.8   < > 3.6  --   --   --  3.9 3.7 3.4* 4.9  CL 100   < > 101  --   --   --  101 101 93* 97*  CO2 17*  --  18*  --   --   --  17* 20* 25 30  GLUCOSE 473*   < > 469*  --   --   --  189* 196* 283* 161*  BUN 29*   < > 27*  --   --   --  _0 CREATININE 1.46*   < > 1.37*  --   --   --  0.83 0.83 0.89 0.98  CALCIUM 8.7*  --  8.2*  --   --   --  8.0* 8.3* 8.4* 8.6*  MG  --   --   --    < > 1.5* 1.4*  --  1.8 1.7 1.8  ALKPHOS 105  --  90  --   --   --   --   --   --   --   AST 14*  --  20  --   --   --   --   --   --   --   ALT 12  --  14  --   --   --   --   --   --   --   ALBUMIN 3.2*  --  2.8*  --   --   --   --   --   --   --    < > = values in this interval not displayed.   02/26/21 EXAM: CHEST  1 VIEW COMPARISON:  01/03/2021 FINDINGS: Lungs are clear.  No pleural effusion or pneumothorax. The heart is normal in size. Surgical clips overlying the right chest wall. IMPRESSION: No evidence of acute cardiopulmonary disease   02/26/21 EXAM: CT ABDOMEN AND PELVIS WITH CONTRAST  IMPRESSION: 1. CT findings suspicious for focal pyelonephritis involving the lower pole region of the left kidney. Recommend  correlation with any left flank pain and recommend correlation with urinalysis. 2. No other significant abdominal/pelvic findings, mass lesions or adenopathy. 3. Status post cholecystectomy. No biliary dilatation. 4. Stable periumbilical abdominal wall hernia containing fat and part of the transverse colon. 5. Age advanced three-vessel coronary artery calcifications.  02/28/21 ECHO  IMPRESSIONS   1. Compared to echo from Sept 2021, RV is dilated and function is down;  TR is increased, IVC dilated.   2. Left  ventricular ejection fraction, by estimation, is 55 to 60%. The  left ventricle has normal function. The left ventricle has no regional  wall motion abnormalities. Left ventricular diastolic parameters are  indeterminate.   3. Right ventricular systolic function is mildly reduced. The right  ventricular size is mildly enlarged. Mildly increased right ventricular  wall thickness. There is severely elevated pulmonary artery systolic  pressure.   4. Mild mitral valve regurgitation.   5. Tricuspid valve regurgitation is moderate to severe.   6. The aortic valve is tricuspid. Aortic valve regurgitation is not  visualized. Mild to moderate aortic valve sclerosis/calcification is  present, without any evidence of aortic stenosis.   7. The inferior vena cava is dilated in size with <50% respiratory  variability, suggesting right atrial pressure of 15 mmHg.   Results/Tests Pending at Time of Discharge: None  Discharge Medications:  Allergies as of 03/03/2021   No Known Allergies      Medication List     TAKE these medications    acetaminophen 325 MG tablet Commonly known as: TYLENOL Take 2 tablets (650 mg total) by mouth every 6 (six) hours as needed for mild pain (or Fever >/= 101).   atorvastatin 40 MG tablet Commonly known as: LIPITOR Take 1 tablet (40 mg total) by mouth daily.   Basaglar KwikPen 100 UNIT/ML INJECT 28 UNITS INTO THE SKIN AT BEDTIME. What changed:  how  much to take how to take this when to take this   cefdinir 300 MG capsule Commonly known as: OMNICEF Take 1 capsule (300 mg total) by mouth every 12 (twelve) hours for 3 days.   CENTRUM SILVER PO Take 1 tablet by mouth daily.   Eliquis 5 MG Tabs tablet Generic drug: apixaban Take 1 tablet (5 mg total) by mouth 2 (two) times daily.   ezetimibe 10 MG tablet Commonly known as: Zetia Take 1 tablet (10 mg total) by mouth daily. To help lower lipids   FeroSul 325 (65 FE) MG tablet Generic drug: ferrous sulfate TAKE 1 TABLET (325 MG TOTAL) BY MOUTH 2 (TWO) TIMES DAILY WITH A MEAL. What changed:  how much to take how to take this when to take this   glucose blood test strip USE AS INSTRUCTED TO CHECK BLOOD SUGARS 3 TIMES DAILY What changed:  how much to take how to take this when to take this   insulin lispro 100 UNIT/ML KwikPen Commonly known as: HumaLOG KwikPen Inject 0.1 mLs (10 Units total) into the skin 3 (three) times daily.   Insulin Syringe-Needle U-100 31G X 5/16" 0.3 ML Misc Commonly known as: TRUEplus Insulin Syringe Use to inject insulin daily. What changed:  how much to take how to take this when to take this   metFORMIN 500 MG tablet Commonly known as: GLUCOPHAGE Take 0.5 tablets (250 mg total) by mouth 4 (four) times daily.   metoprolol succinate 100 MG 24 hr tablet Commonly known as: TOPROL-XL Take 1 tablet (100 mg total) by mouth 2 (two) times daily. Take with or immediately following a meal. What changed:  medication strength how much to take when to take this additional instructions Another medication with the same name was removed. Continue taking this medication, and follow the directions you see here.   mupirocin ointment 2 % Commonly known as: BACTROBAN Place 1 application into the nose 2 (two) times daily.   True Metrix Meter w/Device Kit Use to monitor blood sugar as directed What changed:  how much to take how  to take this when to  take this   TRUEplus Lancets 28G Misc USE TO CHECK BLOOD SUGAR 3 TIMES DAILY What changed:  how much to take how to take this when to take this additional instructions   Victoza 18 MG/3ML Sopn Generic drug: liraglutide INJECT 0.6 MG INTO THE SKIN DAILY. What changed:  how much to take how to take this when to take this               Durable Medical Equipment  (From admission, onward)           Start     Ordered   03/03/21 0311  For home use only DME Cane  Once        03/03/21 0310            Discharge Instructions: Please refer to Patient Instructions section of EMR for full details.  Patient was counseled important signs and symptoms that should prompt return to medical care, changes in medications, dietary instructions, activity restrictions, and follow up appointments.   Follow-Up Appointments:  Follow-up Information     Fulp, Cammie, MD Follow up.   Specialty: Family Medicine Why: Contact your PCP to schedule a follow up. Contact information: [place] [address] [phone]         Pixie Casino, MD .   Specialty: Cardiology Contact information: [place] [address] [phone]                 Gladys Damme, MD 03/03/2021, 4:37 PM PGY-3, [address]

## 2021-03-03 NOTE — TOC Transition Note (Signed)
Transition of Care [facility]) - CM/SW Discharge Note   Patient Details  Name: Emily Farmer MRN: [id number] Date of Birth: Jun 08, 1956  Transition of Care [facility]) CM/SW Contact:  Emily Munroe, RN Phone Number: 03/03/2021, 3:12 PM   Clinical Narrative:  [facility] Team for discharge planning. Met patient at bedside to discuss MD recommendations for home health PT. Pt shared with nurse that sister may not allow services in the home. Discussed with patient the importance of working with PT on strengthening and fall prevention. Pt shares she has fallen recently. Pt shares she and her sister both work at a [employer]und[employer] opposite shifts. Pt does not drive, she uses public transportation. Discussed the option of outpatient rehab if she does not want in home PT. Pt shares that if it is explained to her sister she will consider home PT. Message left with sister to discuss discharge treatment plan. Home health services arranged with [facility] for in home PT. 2458- Received call from Dr. Jinny Farmer- [facility]- stating concern about discharge medications for patient. She shares patient is unable to get medications from regular [facility] pharmacy because it is closed. Due to lack of transportation [facility]. Sister works nights and is unwilling to pick her up from the hospital on today. This will delay her discharge today. 1401- Call to sisterEverlene Farmer- [phone]. Discussed the discharge plan. Sister voices she is unable to pick up sister today but, will pick up meds from pharmacy in the morning when she gets off from work. Call to patient to discuss transportation home via cab. Pt agrees to cab voucher and arranged cab ride home. 7- Collaborated with Dr. Jinny Farmer regarding the plan for sister to pick up medication in the morning and pt being transported home via cab. She expressed concern about medication schedule. Current regimen- Toprol every 6 hours and antibiotic twice a day. She consulted with Dr. Debara Farmer-  Cardio. He shares that he can order Toprol XL 147m twice daily. Nurse- Emily Rochshares she can make sure pt has PM meds prior to discharge. Pt and sister agree with the current treatment plan. Sister- [name] aware of [facility] will make visits to the home for services. She agrees to treatment plan. Will continue to monitor for discharge planning needs.    Final next level of care: Home w Home Health Services Barriers to Discharge: Family Issues, Barriers Resolved, Inadequate or no insurance, Transportation, Other (must enter comment) (Unable to get medication today due to lack of transportation.)   Patient Goals and CMS Choice Patient states their goals for this hospitalization and ongoing recovery are:: Return home to sister. CMS Medicare.gov Compare Post Acute Care list provided to:: Patient Choice offered to / list presented to : Patient  Discharge Placement                       Discharge Plan and Services                          HH Arranged: PT [facility] HospitalAgency: BWhalanDate [place] 03/03/21 Time H[place]ler [id number] spoke with at HLolo CMeredeth [phone] Social Determinants of Health (SDOH) Interventions     Readmission Risk Interventions No flowsheet data found.

## 2021-03-03 NOTE — Progress Notes (Signed)
   Progress Note  Patient Name: Emily Farmer Date of Encounter: 03/03/2021  Primary Cardiologist: Chrystie Nose, MD  Chart reviewed since rounds yesterday.  Heart rate control does look better and blood pressure tolerating increasing Lopressor to 50 mg p.o. every 6 hours.  Follow-up magnesium 1.8 and potassium 4.9.  Hemoglobin stable 11.0.  Medications reviewed below.  Inpatient Medications    Scheduled Meds:  apixaban  5 mg Oral BID   atorvastatin  40 mg Oral Daily   cefdinir  300 mg Oral Q12H   Chlorhexidine Gluconate Cloth  6 each Topical Q0600   ezetimibe  10 mg Oral Daily   ferrous sulfate  325 mg Oral Q breakfast   insulin aspart  0-15 Units Subcutaneous TID WC   insulin glargine-yfgn  28 Units Subcutaneous Daily   metoprolol tartrate  50 mg Oral Q6H   polyethylene glycol  17 g Oral Daily   sodium chloride flush  3 mL Intravenous Q12H   Continuous Infusions:  sodium chloride 250 mL (03/01/21 0700)   Continue with current cardiac regimen.  Can eventually convert to Toprol-XL.  Signed, Nona Dell, MD  03/03/2021, 9:09 AM

## 2021-03-05 ENCOUNTER — Telehealth: Payer: Self-pay

## 2021-03-05 NOTE — Telephone Encounter (Signed)
Transition Care Management Unsuccessful Follow-up Telephone Call  Date of discharge and from where:  03/03/2021, [facility]   Attempts:  1st Attempt  Reason for unsuccessful TCM follow-up call:  Left voice message on # [phone], call back requested to this CM.  Needs to schedule hospital follow up appointment,

## 2021-03-05 NOTE — Telephone Encounter (Signed)
Message received that patient returned call to  this CM.  Call placed to patient and message left with call back requested to this CM

## 2021-03-06 ENCOUNTER — Telehealth: Payer: Self-pay

## 2021-03-06 NOTE — Telephone Encounter (Signed)
Pt called to return call. Please advise.  

## 2021-03-06 NOTE — Telephone Encounter (Signed)
Pt returning call to Jane. Please advise.  

## 2021-03-06 NOTE — Telephone Encounter (Signed)
Transition Care Management Unsuccessful Follow-up Telephone Call  Date of discharge and from where:  03/03/2021, [facility]   Attempts:  2nd Attempt  Reason for unsuccessful TCM follow-up call:  Left voice message on # [phone] Call back requested to this CM.  She has an appointment with Dr Laural Benes 03/28/2021.

## 2021-03-07 ENCOUNTER — Telehealth: Payer: Self-pay

## 2021-03-07 NOTE — Telephone Encounter (Signed)
  Patient has returned your call 

## 2021-03-07 NOTE — Telephone Encounter (Signed)
Noted - called again today for 3rd attempt hospital discharge follow up, had to leave voicemail message

## 2021-03-07 NOTE — Telephone Encounter (Signed)
Patient returned a call to Erskine Squibb can be reached at Ph#  [phone]

## 2021-03-07 NOTE — Telephone Encounter (Signed)
Transition Care Management Unsuccessful Follow-up Telephone Call  Date of discharge and from where:  03/03/2021, [facility]   Attempts:  3rd Attempt  Reason for unsuccessful TCM follow-up call:  Left voice message on # [phone].  Patient has hospital follow up appointment @ [facility] with Dr Laural Benes -03/28/2021

## 2021-03-07 NOTE — Telephone Encounter (Signed)
Transition Care Management Follow-up Telephone Call Call received from patient Date of discharge and from where: 03/03/2021, [facility]  How have you been since you were released from the hospital? She said she is doing fine, taking it easy. Any questions or concerns? No  Items Reviewed: Did the pt receive and understand the discharge instructions provided? Yes  Medications obtained and verified?  She said she has all of the medications except the cefdinir and the new order for metoprolol succinate.  She said she has all of her other medications including the old dose of metoprolol succinate.  [facility] with her Medicaid filling these 2 prescriptions.   She understands that she has family planning Medicaid which does not cover her medications and she said she usually pays out of pocket.  . Explained to her that if [facility] is not able to [facility] her prescriptions, she can call [facility] and have them transferred there to be filled.  It is very important for her get these medications and she said she understood  Other? No  Any new allergies since your discharge? No  Do you have support at home? Yes  - she lives with her sister  Home Care and Equipment/Supplies: Were home health services ordered? yes If so, what is the name of the agency? [facility]  Has the agency set up a time to come to the patient's home? Yes - the PT saw her yesterday.  Were any new equipment or medical supplies ordered?  No What is the name of the medical supply agency? N/a Were you able to get the supplies/equipment? not applicable Do you have any questions related to the use of the equipment or supplies? No  She already has a working glucometer at home  Functional Questionnaire: (I = Independent and D = Dependent) ADLs: independent.  She said she is ambulating without an assistive device   Follow up appointments reviewed:  PCP Hospital f/u appt confirmed? Yes   Scheduled to see Dr Laural Benes on 03/19/2021 @ 1050. Specialist Hospital f/u appt confirmed? Yes  Scheduled to see cardiology - 03/21/2021.  Are transportation arrangements needed? No  - she takes the city bus.  If their condition worsens, is the pt aware to call PCP or go to the Emergency Dept.? Yes Was the patient provided with contact information for the PCP's office or ED? Yes Was to pt encouraged to call back with questions or concerns? Yes

## 2021-03-07 NOTE — Telephone Encounter (Signed)
I have spoken to the patient since this message was received 

## 2021-03-08 ENCOUNTER — Other Ambulatory Visit: Payer: Self-pay

## 2021-03-11 ENCOUNTER — Telehealth: Payer: Self-pay | Admitting: Internal Medicine

## 2021-03-11 ENCOUNTER — Other Ambulatory Visit: Payer: Self-pay

## 2021-03-11 NOTE — Telephone Encounter (Signed)
Pt c/o BP issue: STAT if pt c/o blurred vision, one-sided weakness or slurred speech  1. What are your last 5 BP readings? 158/106/118  2. Are you having any other symptoms (ex. Dizziness, headache, blurred vision, passed out)? Gets headaches  3. What is your BP issue? Beth with [facility] states they had order from the hospital to see the patient for PT and nursing eval. She states they cannot admit her to home care because the patient does not meet their requirements. She states the patient's BP and HR was high. She says the patient's HR was also irregular, but did just start her metoprolol from the hospital today. She would like the patient to receive the call back.

## 2021-03-11 NOTE — Telephone Encounter (Signed)
Left message for patient to call back  

## 2021-03-13 ENCOUNTER — Telehealth: Payer: Self-pay | Admitting: Family Medicine

## 2021-03-13 NOTE — Telephone Encounter (Signed)
Copied from CRM [id number]. Topic: Quick Communication - See Telephone Encounter >> Mar 11, 2021  4:36 PM Aretta Nip wrote: CRM for notification. See Telephone encounter for: 03/11/21. Beth with [facility] has called and needs CB  @ [phone]/ Pt and nursing were requested on this pt and they cannot help her due to her still working outside the home. Beth [facility]) has some real concerns as her visit with Dr Laural Benes is too far out and pt needs help now as has a lot going on. Reach out to [facility] so she can let someone know the issues that are needs to work with re pt   Patient appt with Dr. Laural Benes is on 10/4

## 2021-03-13 NOTE — Telephone Encounter (Signed)
Pt has never seen Dr. Laural Benes and has an upcoming appt with her

## 2021-03-14 NOTE — Telephone Encounter (Signed)
Call returned to [facility], message left with call back requested to this CM or Jay'a

## 2021-03-15 NOTE — Telephone Encounter (Signed)
Spoke with pt, her blood pressure yesterday was 132/79. She reports her bp machine is broken and she will get another one. She will continue to monitor her bp and let us know of any concerns.

## 2021-03-19 ENCOUNTER — Other Ambulatory Visit: Payer: Self-pay

## 2021-03-19 ENCOUNTER — Encounter: Payer: Self-pay | Admitting: Internal Medicine

## 2021-03-19 ENCOUNTER — Ambulatory Visit: Payer: Self-pay | Attending: Internal Medicine | Admitting: Internal Medicine

## 2021-03-19 VITALS — BP 153/84 | HR 113 | Resp 16 | Wt 171.0 lb

## 2021-03-19 DIAGNOSIS — I110 Hypertensive heart disease with heart failure: Secondary | ICD-10-CM | POA: Insufficient documentation

## 2021-03-19 DIAGNOSIS — E785 Hyperlipidemia, unspecified: Secondary | ICD-10-CM | POA: Insufficient documentation

## 2021-03-19 DIAGNOSIS — Z09 Encounter for follow-up examination after completed treatment for conditions other than malignant neoplasm: Secondary | ICD-10-CM

## 2021-03-19 DIAGNOSIS — I4891 Unspecified atrial fibrillation: Secondary | ICD-10-CM

## 2021-03-19 DIAGNOSIS — E1142 Type 2 diabetes mellitus with diabetic polyneuropathy: Secondary | ICD-10-CM

## 2021-03-19 DIAGNOSIS — I152 Hypertension secondary to endocrine disorders: Secondary | ICD-10-CM

## 2021-03-19 DIAGNOSIS — I272 Pulmonary hypertension, unspecified: Secondary | ICD-10-CM

## 2021-03-19 DIAGNOSIS — Z7984 Long term (current) use of oral hypoglycemic drugs: Secondary | ICD-10-CM | POA: Insufficient documentation

## 2021-03-19 DIAGNOSIS — I480 Paroxysmal atrial fibrillation: Secondary | ICD-10-CM | POA: Insufficient documentation

## 2021-03-19 DIAGNOSIS — Z79899 Other long term (current) drug therapy: Secondary | ICD-10-CM | POA: Insufficient documentation

## 2021-03-19 DIAGNOSIS — I071 Rheumatic tricuspid insufficiency: Secondary | ICD-10-CM | POA: Insufficient documentation

## 2021-03-19 DIAGNOSIS — Z1231 Encounter for screening mammogram for malignant neoplasm of breast: Secondary | ICD-10-CM

## 2021-03-19 DIAGNOSIS — E1165 Type 2 diabetes mellitus with hyperglycemia: Secondary | ICD-10-CM

## 2021-03-19 DIAGNOSIS — Z833 Family history of diabetes mellitus: Secondary | ICD-10-CM | POA: Insufficient documentation

## 2021-03-19 DIAGNOSIS — E669 Obesity, unspecified: Secondary | ICD-10-CM | POA: Insufficient documentation

## 2021-03-19 DIAGNOSIS — I5032 Chronic diastolic (congestive) heart failure: Secondary | ICD-10-CM

## 2021-03-19 DIAGNOSIS — F32A Depression, unspecified: Secondary | ICD-10-CM | POA: Insufficient documentation

## 2021-03-19 DIAGNOSIS — Z8249 Family history of ischemic heart disease and other diseases of the circulatory system: Secondary | ICD-10-CM | POA: Insufficient documentation

## 2021-03-19 DIAGNOSIS — Z23 Encounter for immunization: Secondary | ICD-10-CM

## 2021-03-19 DIAGNOSIS — Z7985 Long-term (current) use of injectable non-insulin antidiabetic drugs: Secondary | ICD-10-CM | POA: Insufficient documentation

## 2021-03-19 DIAGNOSIS — Z7901 Long term (current) use of anticoagulants: Secondary | ICD-10-CM | POA: Insufficient documentation

## 2021-03-19 DIAGNOSIS — Z794 Long term (current) use of insulin: Secondary | ICD-10-CM | POA: Insufficient documentation

## 2021-03-19 DIAGNOSIS — Z6830 Body mass index (BMI) 30.0-30.9, adult: Secondary | ICD-10-CM | POA: Insufficient documentation

## 2021-03-19 DIAGNOSIS — D509 Iron deficiency anemia, unspecified: Secondary | ICD-10-CM | POA: Insufficient documentation

## 2021-03-19 DIAGNOSIS — E1159 Type 2 diabetes mellitus with other circulatory complications: Secondary | ICD-10-CM

## 2021-03-19 LAB — GLUCOSE, POCT (MANUAL RESULT ENTRY): POC Glucose: 105 mg/dl — AB (ref 70–99)

## 2021-03-19 MED ORDER — LIRAGLUTIDE 18 MG/3ML ~~LOC~~ SOPN
1.2000 mg | PEN_INJECTOR | Freq: Every day | SUBCUTANEOUS | 6 refills | Status: DC
Start: 2021-03-19 — End: 2021-05-16
  Filled 2021-03-19: qty 6, 30d supply, fill #0

## 2021-03-19 MED ORDER — BASAGLAR KWIKPEN 100 UNIT/ML ~~LOC~~ SOPN
27.0000 [IU] | PEN_INJECTOR | Freq: Every day | SUBCUTANEOUS | 6 refills | Status: DC
  Filled 2021-03-19: qty 6, 22d supply, fill #0
  Filled 2021-04-18: qty 6, 22d supply, fill #1

## 2021-03-19 MED ORDER — LISINOPRIL 5 MG PO TABS
5.0000 mg | ORAL_TABLET | Freq: Every day | ORAL | 3 refills | Status: DC
Start: 2021-03-19 — End: 2021-05-07
  Filled 2021-03-19: qty 30, 30d supply, fill #0

## 2021-03-19 NOTE — Progress Notes (Signed)
Patient ID: Emily Farmer, female    DOB: January 20, 1956  MRN: [id number]  CC: Hospitalization Follow-up   Subjective: Emily Farmer is a 65 y.o. female who presents for hosp f/u.  Previous PCP is [name] who is no longer with the practice. Emily Farmer concerns today include:  Patient with history of HTN, HL, PAF, chronic diastolic CHF DM type II with neuropathy, obesity, depression, IDA, pancreatitis due to TG  Patient hospitalized 9/13-18/22 with sepsis secondary to pyelonephritis.  Urine culture grew Klebsiella pneumonia.  She was treated with appropriate antibiotics and then discharged on oral cefdinir for 7 days. [facility] course complicated by RVR of Emily Farmer atrial fibrillation.  She was seen by cardiology.  She was placed on metoprolol initially every 6 hours but discharged on Toprol XL 100 mg twice a day. -Diabetes noted to be poorly controlled with glucose greater than 600 on admission and A1c of 12.8.  She was started on Semglee and sliding scale insulin in the hospital.  She was seen by the diabetic educator. -Echo revealed EF of 55 to 60% with severe pulmonary hypertension and moderate to severe tricuspid regurg.  Cardiology recommended PFTs, repeat echo and possible right heart catheterization in the outpatient setting.  Today: Sepsis has resolved.  She has completed Emily Farmer antibiotics.  No fever.  No dysuria.  HTN/CHF/PAF/pulmonary hypertension/tricuspid regurg: She denies any palpitations.  No lower extremity edema.  Endorses shortness of breath when she does a lot of walking.  She is compliant with taking Eliquis.  She has an upcoming appointment with cardiologist later this week.  DM: Currently taking glargine 24 units daily, NovoLog 10 units with meals, Victoza 0.6 mg daily and metformin.  She reports compliance with Emily Farmer medications.  She checks blood sugars 4 times a day.  Gives range before meals as 180-200.  Blood sugars at bedtime 200s to 300s.  She feels she is doing better with Emily Farmer  eating habits.  She last saw [name] 02/2020  HM: Agreeable to receiving the flu shot.  She is due for mammogram and Pap smear. Patient Active Problem List   Diagnosis Date Noted   Pulmonary hypertension ([place]) 03/19/2021   Hypomagnesemia    Sepsis ([place]) 02/27/2021   Atrial fibrillation with rapid ventricular response ([place]) 02/27/2021   Hypotension 02/27/2021   Secondary hypercoagulable state ([place]) 05/08/2020   Type 2 diabetes mellitus with diabetic polyneuropathy, with long-term current use of insulin ([name]) 09/23/2019   Type 2 diabetes mellitus with hyperglycemia, with long-term current use of insulin ([name]) 09/23/2019   Dyslipidemia 09/23/2019   Toenail fungus 07/19/2018   Depression 07/19/2018   Iron deficiency anemia due to chronic blood loss 07/19/2018   Gastritis and duodenitis 07/19/2018   Chronic a-fib ([name]) 01/28/2018   Hyponatremia 01/28/2018   Chronic diastolic CHF (congestive heart failure) ([name]) 01/28/2018   Mixed hyperlipidemia 06/01/2017   Paroxysmal A-fib ([name]) 10/04/2016   Hypokalemia 01/04/2014   Pyelonephritis, acute 02/15/2013   Essential hypertension 02/15/2013   Hypertriglyceridemia 02/15/2013   Diabetes mellitus type 2 in obese ([place]) 02/15/2013   Coronary atherosclerosis seen on CT 02/15/2013     Current Outpatient Medications on File Prior to Visit  Medication Sig Dispense Refill   acetaminophen (TYLENOL) 325 MG tablet Take 2 tablets (650 mg total) by mouth every 6 (six) hours as needed for mild pain (or Fever >/= 101).     apixaban (ELIQUIS) 5 MG TABS tablet Take 1 tablet (5 mg total) by mouth 2 (two) times daily. 60 tablet  0   atorvastatin (LIPITOR) 40 MG tablet Take 1 tablet (40 mg total) by mouth daily. (Patient not taking: Reported on 03/19/2021) 90 tablet 3   Blood Glucose Monitoring Suppl (TRUE METRIX METER) w/Device KIT Use to monitor blood sugar as directed (Patient taking differently: 1 each by Other route See admin instructions. Use to  monitor blood sugar as directed) 1 kit 0   ezetimibe (ZETIA) 10 MG tablet Take 1 tablet (10 mg total) by mouth daily. To help lower lipids (Patient not taking: Reported on 03/19/2021) 90 tablet 3   ferrous sulfate 325 (65 FE) MG tablet TAKE 1 TABLET (325 MG TOTAL) BY MOUTH 2 (TWO) TIMES DAILY WITH A MEAL. (Patient taking differently: Take 325 mg by mouth 2 (two) times daily with a meal.) 60 tablet 3   glucose blood test strip USE AS INSTRUCTED TO CHECK BLOOD SUGARS 3 TIMES DAILY (Patient taking differently: 1 each by Other route See admin instructions. USE AS INSTRUCTED TO CHECK BLOOD SUGARS 3 TIMES DAILY) 100 each 11   insulin lispro (HUMALOG KWIKPEN) 100 UNIT/ML KwikPen Inject 0.1 mLs (10 Units total) into the skin 3 (three) times daily. 30 mL 11   Insulin Syringe-Needle U-100 (TRUEPLUS INSULIN SYRINGE) 31G X 5/16" 0.3 ML MISC Use to inject insulin daily. (Patient taking differently: 1 each by Other route See admin instructions. Use to inject insulin daily.) 100 each 11   metFORMIN (GLUCOPHAGE) 500 MG tablet Take 0.5 tablets (250 mg total) by mouth 4 (four) times daily. 60 tablet 2   metoprolol succinate (TOPROL-XL) 100 MG 24 hr tablet Take 1 tablet (100 mg total) by mouth 2 (two) times daily. Take with or immediately following a meal. 60 tablet 1   Multiple Vitamins-Minerals (CENTRUM SILVER PO) Take 1 tablet by mouth daily.     mupirocin ointment (BACTROBAN) 2 % Place 1 application into the nose 2 (two) times daily. 30 g 0   TRUEplus Lancets 28G MISC USE TO CHECK BLOOD SUGAR 3 TIMES DAILY (Patient taking differently: 1 each by Other route See admin instructions. USE TO CHECK BLOOD SUGAR 3 TIMES DAILY) 100 each 11   No current facility-administered medications on file prior to visit.    No Known Allergies  Social History   Socioeconomic History   Marital status: Single    Spouse name: Not on file   Number of children: Not on file   Years of education: Not on file   Highest education level:  Not on file  Occupational History   Not on file  Tobacco Use   Smoking status: Never   Smokeless tobacco: Never  Vaping Use   Vaping Use: Never used  Substance and Sexual Activity   Alcohol use: No   Drug use: No   Sexual activity: Not Currently  Other Topics Concern   Not on file  Social History Narrative   Lives with Emily Farmer sister and does not need any assistance with ADLs.     Social Determinants of Health   Financial Resource Strain: Not on file  Food Insecurity: Not on file  Transportation Needs: Not on file  Physical Activity: Not on file  Stress: Not on file  Social Connections: Not on file  Intimate Partner Violence: Not on file    Family History  Problem Relation Age of Onset   Heart attack Mother    Heart attack Father    Diabetes Sister    High blood pressure Neg Hx    High Cholesterol Neg Hx  Past Surgical History:  Procedure Laterality Date   BIOPSY  06/20/2018   Procedure: BIOPSY;  Surgeon: Clarene Essex, MD;  Location: [place];  Service: Endoscopy;;   CHOLECYSTECTOMY N/A 01/04/2014   Procedure: LAPAROSCOPIC CHOLECYSTECTOMY WITH INTRAOPERATIVE CHOLANGIOGRAM;  Surgeon: Ralene Ok, MD;  Location: [place];  Service: General;  Laterality: N/A;   COLONOSCOPY WITH PROPOFOL N/A 06/21/2018   Procedure: COLONOSCOPY WITH PROPOFOL;  Surgeon: Ronald Lobo, MD;  Location: [place];  Service: Endoscopy;  Laterality: N/A;   ESOPHAGOGASTRODUODENOSCOPY (EGD) WITH PROPOFOL N/A 06/20/2018   Procedure: ESOPHAGOGASTRODUODENOSCOPY (EGD) WITH PROPOFOL;  Surgeon: Clarene Essex, MD;  Location: [place];  Service: Endoscopy;  Laterality: N/A;   LUNG SURGERY      ROS: Review of Systems Negative except as stated above  PHYSICAL EXAM: BP (!) 153/84   Pulse (!) 113   Resp 16   Wt 171 lb (77.6 kg)   SpO2 96%   BMI 30.29 kg/m   Physical Exam  General appearance - alert, well appearing, pleasant elderly Caucasian female and in no distress Mental status - normal  mood, behavior, speech, dress, motor activity, and thought processes Neck - supple, no significant adenopathy Chest -breath sounds mildly decreased bilaterally but without crackles or rhonchi. Heart -irregularly irregular and rate sounds to be controlled. Extremities -no lower extremity edema.   CMP Latest Ref Rng & Units 03/03/2021 03/02/2021 03/01/2021  Glucose 70 - 99 mg/dL 161(H) 283(H) 196(H)  BUN 8 - 23 mg/dL _0 Creatinine 0.44 - 1.00 mg/dL 0.98 0.89 0.83  Sodium 135 - 145 mmol/L 133(L) 130(L) 131(L)  Potassium 3.5 - 5.1 mmol/L 4.9 3.4(L) 3.7  Chloride 98 - 111 mmol/L 97(L) 93(L) 101  CO2 22 - 32 mmol/L 30 25 20(L)  Calcium 8.9 - 10.3 mg/dL 8.6(L) 8.4(L) 8.3(L)  Total Protein 6.5 - 8.1 g/dL - - -  Total Bilirubin 0.3 - 1.2 mg/dL - - -  Alkaline Phos 38 - 126 U/L - - -  AST 15 - 41 U/L - - -  ALT 0 - 44 U/L - - -   Lipid Panel     Component Value Date/Time   CHOL 214 (H) 06/05/2020 0837   TRIG 374 (H) 06/05/2020 0837   HDL 40 06/05/2020 0837   CHOLHDL 5.4 (H) 06/05/2020 0837   CHOLHDL 5 09/23/2019 0828   VLDL NOT CALC 11/21/2016 0822   LDLCALC 110 (H) 06/05/2020 0837   LDLDIRECT 137 (H) 03/01/2020 1012   LDLDIRECT 96.0 09/23/2019 0828    CBC    Component Value Date/Time   WBC 9.2 03/03/2021 0038   RBC 3.71 (L) 03/03/2021 0038   HGB 11.0 (L) 03/03/2021 0038   HGB 15.2 03/02/2020 1050   HCT 32.6 (L) 03/03/2021 0038   HCT 47.6 (H) 03/02/2020 1050   PLT 185 03/03/2021 0038   PLT 230 03/02/2020 1050   MCV 87.9 03/03/2021 0038   MCV 87 03/02/2020 1050   MCH 29.6 03/03/2021 0038   MCHC 33.7 03/03/2021 0038   RDW 13.0 03/03/2021 0038   RDW 14.1 03/02/2020 1050   LYMPHSABS 0.3 (L) 02/26/2021 1915   LYMPHSABS 1.8 07/19/2018 0936   MONOABS 0.2 02/26/2021 1915   EOSABS 0.0 02/26/2021 1915   EOSABS 0.2 07/19/2018 0936   BASOSABS 0.0 02/26/2021 1915   BASOSABS 0.0 07/19/2018 0936    ASSESSMENT AND PLAN: 1. Hospital discharge follow-up Symptoms of sepsis have  resolved.  2. Type 2 diabetes mellitus with peripheral neuropathy (HCC) Not at goal. Dietary  counseling given. Increase glargine insulin from 24 units daily to 27 units daily. Increase Victoza to 1.2 mg daily. Continue to monitor blood sugars with goal before meals being 90-130. - POCT glucose (manual entry) - Microalbumin / creatinine urine ratio - Insulin Glargine (BASAGLAR KWIKPEN) 100 UNIT/ML; Inject 27 Units into the skin at bedtime.  Dispense: 30 mL; Refill: 6 - liraglutide (VICTOZA) 18 MG/3ML SOPN; Inject 1.2 mg into the skin daily.  Dispense: 6 mL; Refill: 6  3. Hypertension associated with diabetes ([place]) Not at goal.  Add low-dose of lisinopril.  Continue Toprol. - lisinopril (ZESTRIL) 5 MG tablet; Take 1 tablet (5 mg total) by mouth daily.  Dispense: 90 tablet; Refill: 3  4. Atrial fibrillation with rapid ventricular response (HCC) Continue Toprol and Eliquis.  5. Chronic diastolic CHF (congestive heart failure) ([place]) 6. Pulmonary hypertension ([place]) -Continue current medications and keep follow-up appointment with cardiologist.  DASH diet encouraged.  Not volume overloaded at this time.  7. Need for immunization against influenza - Flu Vaccine QUAD 12moIM (Fluarix, Fluzone & Alfiuria Quad PF)  8. Encounter for screening mammogram for malignant neoplasm of breast Given mammogram scholarship form to complete. - MM Digital Screening; Future     Patient was given the opportunity to ask questions.  Patient verbalized understanding of the plan and was able to repeat key elements of the plan.   Orders Placed This Encounter  Procedures   MM Digital Screening   Flu Vaccine QUAD 69moM (Fluarix, Fluzone & Alfiuria Quad PF)   Microalbumin / creatinine urine ratio   POCT glucose (manual entry)     Requested Prescriptions   Signed Prescriptions Disp Refills   Insulin Glargine (BASAGLAR KWIKPEN) 100 UNIT/ML 30 mL 6    Sig: Inject 27 Units into the skin at bedtime.    liraglutide (VICTOZA) 18 MG/3ML SOPN 6 mL 6    Sig: Inject 1.2 mg into the skin daily.   lisinopril (ZESTRIL) 5 MG tablet 90 tablet 3    Sig: Take 1 tablet (5 mg total) by mouth daily.    Return in about 7 weeks (around 05/07/2021) for PAP.  DeKarle PlumberMD, FACP

## 2021-03-19 NOTE — Patient Instructions (Signed)
Increase Victoza to 1.2 mg daily. Increase glargine insulin to 27 units daily.  We have started a new blood pressure medication called lisinopril 5 mg daily.

## 2021-03-19 NOTE — Progress Notes (Signed)
Pt is requesting a refill on Ferrous Sulfate, Basaglar and Humalog

## 2021-03-20 ENCOUNTER — Other Ambulatory Visit: Payer: Self-pay

## 2021-03-21 ENCOUNTER — Encounter (HOSPITAL_BASED_OUTPATIENT_CLINIC_OR_DEPARTMENT_OTHER): Payer: Self-pay | Admitting: Family

## 2021-03-21 ENCOUNTER — Other Ambulatory Visit: Payer: Self-pay

## 2021-03-21 ENCOUNTER — Ambulatory Visit (INDEPENDENT_AMBULATORY_CARE_PROVIDER_SITE_OTHER): Payer: Self-pay | Admitting: Family

## 2021-03-21 ENCOUNTER — Telehealth: Payer: Self-pay

## 2021-03-21 VITALS — BP 90/72 | HR 89 | Ht 63.0 in | Wt 168.0 lb

## 2021-03-21 DIAGNOSIS — I5032 Chronic diastolic (congestive) heart failure: Secondary | ICD-10-CM

## 2021-03-21 DIAGNOSIS — E785 Hyperlipidemia, unspecified: Secondary | ICD-10-CM

## 2021-03-21 DIAGNOSIS — G4733 Obstructive sleep apnea (adult) (pediatric): Secondary | ICD-10-CM

## 2021-03-21 DIAGNOSIS — Z7901 Long term (current) use of anticoagulants: Secondary | ICD-10-CM

## 2021-03-21 DIAGNOSIS — I272 Pulmonary hypertension, unspecified: Secondary | ICD-10-CM

## 2021-03-21 DIAGNOSIS — E782 Mixed hyperlipidemia: Secondary | ICD-10-CM

## 2021-03-21 DIAGNOSIS — I38 Endocarditis, valve unspecified: Secondary | ICD-10-CM

## 2021-03-21 DIAGNOSIS — I10 Essential (primary) hypertension: Secondary | ICD-10-CM

## 2021-03-21 MED ORDER — ATORVASTATIN CALCIUM 40 MG PO TABS
40.0000 mg | ORAL_TABLET | Freq: Every day | ORAL | 3 refills | Status: DC
  Filled 2021-03-21: qty 30, 30d supply, fill #0

## 2021-03-21 MED ORDER — METOPROLOL SUCCINATE ER 100 MG PO TB24
100.0000 mg | ORAL_TABLET | Freq: Two times a day (BID) | ORAL | 2 refills | Status: DC
  Filled 2021-03-21: qty 180, 90d supply, fill #0
  Filled 2021-04-18: qty 60, 30d supply, fill #0

## 2021-03-21 MED ORDER — EZETIMIBE 10 MG PO TABS
10.0000 mg | ORAL_TABLET | Freq: Every day | ORAL | 3 refills | Status: DC
  Filled 2021-03-21: qty 90, 90d supply, fill #0

## 2021-03-21 NOTE — Progress Notes (Signed)
Office Visit    Patient Name: Emily Farmer Date of Encounter: 03/21/2021  PCP:  Emily Pier, MD   [place]  Cardiologist:  Emily Casino, MD  Advanced Practice Provider:  No care team member to display Electrophysiologist:  None     Chief Complaint    Emily Farmer is a 65 y.o. female with a hx of atrial fibrillation HLD, DM2, anemia, pancreatitis, tricuspid regurgitation, mitral regurgitation, dyspnea on exertion, diastolic dysfunction presents today for follow up of atrial fibrillation   Past Medical History    Past Medical History:  Diagnosis Date   Arthritis    Atrial fibrillation ([place])    a. 09/2016 in setting of pancreatitis;  b. 09/2016 Echo: EF 65-60%, no rwma, Gr2 DD, mild MR, triv TR, PASP 81mHg;  CHA2DS2VASc = 4-->Eliquis 517mBID. c. Recurred 06/2018 in setting of GIB.   Chronic diastolic CHF (congestive heart failure) ([facility]8/15/2019   Diabetes mellitus    GI bleeding 06/2018   Gout    Hyperlipidemia    Hypertension    Hypertriglyceridemia    Pancreatitis    a. 09/2016 - Triglycerides 1,392 on admission.   Past Surgical History:  Procedure Laterality Date   BIOPSY  06/20/2018   Procedure: BIOPSY;  Surgeon: MaClarene EssexMD;  [facility] Service: Endoscopy;;   CHOLECYSTECTOMY N/A 01/04/2014   Procedure: LAPAROSCOPIC CHOLECYSTECTOMY WITH INTRAOPERATIVE CHOLANGIOGRAM;  Surgeon: ArRalene OkMD;  [facility] Service: General;  Laterality: N/A;   COLONOSCOPY WITH PROPOFOL N/A 06/21/2018   Procedure: COLONOSCOPY WITH PROPOFOL;  Surgeon: BuRonald LoboMD;  [facility] Service: Endoscopy;  Laterality: N/A;   ESOPHAGOGASTRODUODENOSCOPY (EGD) WITH PROPOFOL N/A 06/20/2018   Procedure: ESOPHAGOGASTRODUODENOSCOPY (EGD) WITH PROPOFOL;  Surgeon: MaClarene EssexMD;  [facility] Service: Endoscopy;  Laterality: N/A;   LUNG SURGERY      Allergies  No Known Allergies  History of Present Illness     [name] a [age].o. female with a hx of atrial fibrillation HLD, DM2, anemia, pancreatitis, tricuspid regurgitation, mitral regurgitation, dyspnea on exertion, diastolic dysfunction last seen 05/24/20.  She previously had difficult with medication compliance which resolved when she obtained Medicaid coverage.   Last seen by Dr. HiDebara Pickett2/9/21 with persistent dyspnea. A home sleep study was ordered with subsequent recommendation for BIPAP. BIPAP application was sent to Emily Farmer assistance program as her Medicaid did not cover BIPAP.   Recently admitted 02/26/21-03/03/21 with sepsis due to pyelonephritis. Treated with IV abx. Her Metoprolol was titrated to tartrate 5070m6h due to atrial fibrillation with RVR. Echo with mild RV dilation and mildly decreased contraction with associated severe pulmonary hypertension.   Presents today for follow up. Very pleasant lady who lives with her sister. Reports feeling well since discharge. No fever nor urinary symptoms since discharge. Had a brief fleeting episode of chest discomfort which occurred while in our waiting room and self resolved. Otherwise no chest pain. She will get dyspnea on exertion with more than usual activity or with hills. Tells me this is about the same as it usually is. No edema, orthopnea, PND. Report no hematuria nor melena. Reports no palpitations. Endorses compliance with all of her medications. She does use the bus to get to all of her appointments and we discussed transportation services.   EKGs/Labs/Other Studies Reviewed:   The following studies were reviewed today:  Echo 02/28/21 IMPRESSIONS   1. Compared to echo  from Sept 2021, RV is dilated and function is down;  TR is increased, IVC dilated.   2. Left ventricular ejection fraction, by estimation, is 55 to 60%. The  left ventricle has normal function. The left ventricle has no regional  wall motion abnormalities. Left ventricular diastolic  parameters are  indeterminate.   3. Right ventricular systolic function is mildly reduced. The right  ventricular size is mildly enlarged. Mildly increased right ventricular  wall thickness. There is severely elevated pulmonary artery systolic  pressure.   4. Mild mitral valve regurgitation.   5. Tricuspid valve regurgitation is moderate to severe.   6. The aortic valve is tricuspid. Aortic valve regurgitation is not  visualized. Mild to moderate aortic valve sclerosis/calcification is  present, without any evidence of aortic stenosis.   7. The inferior vena cava is dilated in size with <50% respiratory  variability, suggesting right atrial pressure of 15 mmHg.   EKG:  No EKG today.   Recent Labs: 05/05/2020: TSH 0.480 01/03/2021: B Natriuretic Peptide 94.1 02/27/2021: ALT 14 03/03/2021: BUN 18; Creatinine, Ser 0.98; Hemoglobin 11.0; Magnesium 1.8; Platelets 185; Potassium 4.9; Sodium 133  Recent Lipid Panel    Component Value Date/Time   CHOL 214 (H) 06/05/2020 0837   TRIG 374 (H) 06/05/2020 0837   HDL 40 06/05/2020 0837   CHOLHDL 5.4 (H) 06/05/2020 0837   CHOLHDL 5 09/23/2019 0828   VLDL NOT CALC 11/21/2016 0822   LDLCALC 110 (H) 06/05/2020 0837   LDLDIRECT 137 (H) 03/01/2020 1012   LDLDIRECT 96.0 09/23/2019 0828    Risk Assessment/Calculations:   CHA2DS2-VASc Score = 3  This indicates a 3.2% annual risk of stroke. The patient's score is based upon: CHF History: 0 HTN History: 1 Diabetes History: 1 Stroke History: 0 Vascular Disease History: 0 Age Score: 0 Gender Score: 1    Home Medications   Current Meds  Medication Sig   acetaminophen (TYLENOL) 325 MG tablet Take 2 tablets (650 mg total) by mouth every 6 (six) hours as needed for mild pain (or Fever >/= 101).   apixaban (ELIQUIS) 5 MG TABS tablet Take 1 tablet (5 mg total) by mouth 2 (two) times daily.   Blood Glucose Monitoring Suppl (TRUE METRIX METER) w/Device KIT Use to monitor blood sugar as directed  (Patient taking differently: 1 each by Other route See admin instructions. Use to monitor blood sugar as directed)   ezetimibe (ZETIA) 10 MG tablet Take 1 tablet (10 mg total) by mouth daily. To help lower lipids   ferrous sulfate 325 (65 FE) MG tablet TAKE 1 TABLET (325 MG TOTAL) BY MOUTH 2 (TWO) TIMES DAILY WITH A MEAL. (Patient taking differently: Take 325 mg by mouth 2 (two) times daily with a meal.)   glucose blood test strip USE AS INSTRUCTED TO CHECK BLOOD SUGARS 3 TIMES DAILY (Patient taking differently: 1 each by Other route See admin instructions. USE AS INSTRUCTED TO CHECK BLOOD SUGARS 3 TIMES DAILY)   Insulin Glargine (BASAGLAR KWIKPEN) 100 UNIT/ML Inject 27 Units into the skin at bedtime.   insulin lispro (HUMALOG KWIKPEN) 100 UNIT/ML KwikPen Inject 0.1 mLs (10 Units total) into the skin 3 (three) times daily.   Insulin Syringe-Needle U-100 (TRUEPLUS INSULIN SYRINGE) 31G X 5/16" 0.3 ML MISC Use to inject insulin daily. (Patient taking differently: 1 each by Other route See admin instructions. Use to inject insulin daily.)   liraglutide (VICTOZA) 18 MG/3ML SOPN Inject 1.2 mg into the skin daily.   lisinopril (ZESTRIL) 5 MG  tablet Take 1 tablet (5 mg total) by mouth daily.   metFORMIN (GLUCOPHAGE) 500 MG tablet Take 0.5 tablets (250 mg total) by mouth 4 (four) times daily.   metoprolol succinate (TOPROL-XL) 100 MG 24 hr tablet Take 1 tablet (100 mg total) by mouth 2 (two) times daily. Take with or immediately following a meal.   Multiple Vitamins-Minerals (CENTRUM SILVER PO) Take 1 tablet by mouth daily.   mupirocin ointment (BACTROBAN) 2 % Place 1 application into the nose 2 (two) times daily.   TRUEplus Lancets 28G MISC USE TO CHECK BLOOD SUGAR 3 TIMES DAILY (Patient taking differently: 1 each by Other route See admin instructions. USE TO CHECK BLOOD SUGAR 3 TIMES DAILY)   [DISCONTINUED] atorvastatin (LIPITOR) 40 MG tablet Take 1 tablet (40 mg total) by mouth daily.     Review of  Systems      All other systems reviewed and are otherwise negative except as noted above.  Physical Exam    VS:  BP 90/72   Pulse 89   Ht '5\' 3"'  (1.6 m)   Wt 168 lb (76.2 kg)   SpO2 92%   BMI 29.76 kg/m  , BMI Body mass index is 29.76 kg/m.  Wt Readings from Last 3 Encounters:  03/21/21 168 lb (76.2 kg)  03/19/21 171 lb (77.6 kg)  03/03/21 176 lb 12.9 oz (80.2 kg)     GEN: Well nourished, well developed, in no acute distress. HEENT: normal. Neck: Supple, no JVD, carotid bruits, or masses. Cardiac: irregularly irregular, no murmurs, rubs, or gallops. No clubbing, cyanosis, edema.  Radials/PT 2+ and equal bilaterally.  Respiratory:  Respirations regular and unlabored, clear to auscultation bilaterally. GI: Soft, nontender, nondistended. MS: No deformity or atrophy. Skin: Warm and dry, no rash. Neuro:  Strength and sensation are intact. Psych: Normal affect.  Assessment & Plan    Permanent atrial fibrillation / Chronic anticoagulation - Rate controlled today. Continue Toprol 156m QD. Will refill today. Continue Eliquis 533mBID. Denies bleeding complications. CHA2DS2-VASc Score = 4 [CHF History: 1, HTN History: 1, Diabetes History: 1, Stroke History: 0, Vascular Disease History: 0, Age Score: 0, Gender Score: 1].  Therefore, the patient's annual risk of stroke is 4.8 %.     HTN - Now with hypotension though asymptomatic. Will continue current Lisinopril 80m81mD. Encouraged home monitoring. If persistent lightheadedness and bradycardia, plan to discontinue.   DM2 - Not well controlled with 02/27/21 A1c 12.8. Continue to follow with PCP.   HLD - Continue Zetia and Atorvastatin. Will refill Atorvastatin and Zetia today.  HFpEF / Pulmonary hypertension - Euvolemic and well compensated on exam. Dyspnea seems improved since recent discharge. Recommended during admission for intermittent diuresis. If dyspnea worsens, could consider low dose daily Lasix with careful monitoring of BP. If  persistent of new dyspnea, consider PFT. No indication for cardiac catheterization at this time.   OSA - Recommend compliance with BIPAP  Valvular heart disease - Continue optimal BP and volume control.  Transportation difficulties - Presently using bus. Will refer for transportation services.   Disposition: Follow up in 3-4 month(s) with Dr. HilDebara Farmer APP.  Signed, CaiLoel DubonnetP 03/21/2021, 9:05 PM [place]

## 2021-03-21 NOTE — Telephone Encounter (Signed)
-----   Message from Marcine Matar, MD sent at 03/19/2021  1:33 PM EDT ----- Please give f/u appt in 6 wks for PAP

## 2021-03-21 NOTE — Patient Instructions (Signed)
Medication Instructions:  Continue your current medications.   *If you need a refill on your cardiac medications before your next appointment, please call your pharmacy*   Lab Work: None ordered today.   Testing/Procedures: None ordered today.   Follow-Up: At [facility], you and your health needs are our priority.  As part of our continuing mission to provide you with exceptional heart care, we have created designated Provider Care Teams.  These Care Teams include your primary Cardiologist (physician) and Advanced Practice Providers (APPs -  Physician Assistants and Nurse Practitioners) who all work together to provide you with the care you need, when you need it.  We recommend signing up for the patient portal called "MyChart".  Sign up information is provided on this After Visit Summary.  MyChart is used to connect with patients for Virtual Visits (Telemedicine).  Patients are able to view lab/test results, encounter notes, upcoming appointments, etc.  Non-urgent messages can be sent to your provider as well.   To learn more about what you can do with MyChart, go to [email or web address].    Your next appointment:   6 month(s)  The format for your next appointment:   In Person  Provider:   You may see Chrystie Nose, MD or one of the following Advanced Practice Providers on your designated Care Team:   Azalee Course, PA-C Micah Flesher, PA-C or  Judy Pimple, [place]   Other Instructions  Heart Healthy Diet Recommendations: A low-salt diet is recommended. Meats should be grilled, baked, or boiled. Avoid fried foods. Focus on lean protein sources like fish or chicken with vegetables and fruits. The American Heart Association is a Chief Technology Officer!    Exercise recommendations: The American Heart Association recommends 150 minutes of moderate intensity exercise weekly. Try 30 minutes of moderate intensity exercise 4-5 times per week. This could include walking, jogging, or  swimming.    Orthostatic Hypotension Blood pressure is a measurement of how strongly, or weakly, your circulating blood is pressing against the walls of your arteries. Orthostatic hypotension is a drop in blood pressure that can happen when you change positions, such as when you go from lying down to standing. Arteries are blood vessels that carry blood from your heart throughout your body. When blood pressure is too low, you may not get enough blood to your brain or to the rest of your organs. Orthostatic hypotension can cause light-headedness, sweating, rapid heartbeat, blurred vision, and fainting. These symptoms require further investigation into the cause. What are the causes? Orthostatic hypotension can be caused by many things, including: Sudden changes in posture, such as standing up quickly after you have been sitting or lying down. Loss of blood (anemia) or loss of body fluids (dehydration). Heart problems, neurologic problems, or hormone problems. Pregnancy. Aging. The risk for this condition increases as you get older. Severe infection (sepsis). Certain medicines, such as medicines for high blood pressure or medicines that make the body lose excess fluids (diuretics). What are the signs or symptoms? Symptoms of this condition may include: Weakness, light-headedness, or dizziness. Sweating. Blurred vision. Tiredness (fatigue). Rapid heartbeat. Fainting, in severe cases. How is this diagnosed? This condition is diagnosed based on: Your symptoms and medical history. Your blood pressure measurements. Your health care provider will check your blood pressure when you are: Lying down. Sitting. Standing. A blood pressure reading is recorded as two numbers, such as "120 over 80" (or 120/80). The first ("top") number is called the systolic pressure. It is a  measure of the pressure in your arteries as your heart beats. The second ("bottom") number is called the diastolic pressure. It is  a measure of the pressure in your arteries when your heart relaxes between beats. Blood pressure is measured in a unit called mmHg. Healthy blood pressure for most adults is 120/80 mmHg. Orthostatic hypotension is defined as a 20 mmHg drop in systolic pressure or a 10 mmHg drop in diastolic pressure within 3 minutes of standing. Other information or tests that may be used to diagnose orthostatic hypotension include: Your other vital signs, such as your heart rate and temperature. Blood tests. An electrocardiogram (ECG) or echocardiogram. A Holter monitor. This is a device you wear that records your heart rhythm continuously, usually for 24-48 hours. Tilt table test. For this test, you will be safely secured to a table that moves you from a lying position to an upright position. Your heart rhythm and blood pressure will be monitored during the test. How is this treated? This condition may be treated by: Changing your diet. This may involve eating more salt (sodium) or drinking more water. Changing the dosage of certain medicines you are taking that might be lowering your blood pressure. Correcting the underlying reason for the orthostatic hypotension. Wearing compression stockings. Taking medicines to raise your blood pressure. Avoiding actions that trigger symptoms. Follow these instructions at home: Medicines Take over-the-counter and prescription medicines only as told by your health care provider. Follow instructions from your health care provider about changing the dosage of your current medicines, if this applies. Do not stop or adjust any of your medicines on your own. Eating and drinking  Drink enough fluid to keep your urine pale yellow. Eat extra salt only as directed. Do not add extra salt to your diet unless advised by your health care provider. Eat frequent, small meals. Avoid standing up suddenly after eating. General instructions  Get up slowly from lying down or sitting  positions. This gives your blood pressure a chance to adjust. Avoid hot showers and excessive heat as directed by your health care provider. Engage in regular physical activity as directed by your health care provider. If you have compression stockings, wear them as told. Keep all follow-up visits. This is important. Contact a health care provider if: You have a fever for more than 2-3 days. You feel more thirsty than usual. You feel dizzy or weak. Get help right away if: You have chest pain. You have a fast or irregular heartbeat. You become sweaty or feel light-headed. You feel short of breath. You faint. You have any symptoms of a stroke. "BE FAST" is an easy way to remember the main warning signs of a stroke: B - Balance. Signs are dizziness, sudden trouble walking, or loss of balance. E - Eyes. Signs are trouble seeing or a sudden change in vision. F - Face. Signs are sudden weakness or numbness of the face, or the face or eyelid drooping on one side. A - Arms. Signs are weakness or numbness in an arm. This happens suddenly and usually on one side of the body. S - Speech. Signs are sudden trouble speaking, slurred speech, or trouble understanding what people say. T - Time. Time to call emergency services. Write down what time symptoms started. You have other signs of a stroke, such as: A sudden, severe headache with no known cause. Nausea or vomiting. Seizure. These symptoms may represent a serious problem that is an emergency. Do not wait to see if  the symptoms will go away. Get medical help right away. Call your local emergency services (911 in the [place] Do not drive yourself to the hospital. Summary Orthostatic hypotension is a sudden drop in blood pressure. It can cause light-headedness, sweating, rapid heartbeat, blurred vision, and fainting. Orthostatic hypotension can be diagnosed by having your blood pressure taken while lying down, sitting, and then standing. Treatment  may involve changing your diet, wearing compression stockings, sitting up slowly, adjusting your medicines, or correcting the underlying reason for the orthostatic hypotension. Get help right away if you have chest pain, a fast or irregular heartbeat, or symptoms of a stroke. This information is not intended to replace advice given to you by your health care provider. Make sure you discuss any questions you have with your health care provider. Document Revised: 08/16/2020 Document Reviewed: 08/16/2020 Elsevier Patient Education  2022 [employer].

## 2021-03-21 NOTE — Telephone Encounter (Signed)
Pt has been scheduled and reminder has been mailed.  

## 2021-03-22 ENCOUNTER — Telehealth (HOSPITAL_BASED_OUTPATIENT_CLINIC_OR_DEPARTMENT_OTHER): Payer: Self-pay | Admitting: Family

## 2021-03-22 ENCOUNTER — Other Ambulatory Visit: Payer: Self-pay

## 2021-03-22 NOTE — Telephone Encounter (Signed)
Left message for patient to call and schedule 3-4 month follow up with Gillian Shields, NP or Dr. Rennis Golden per Gillian Shields, NP

## 2021-03-25 ENCOUNTER — Other Ambulatory Visit: Payer: Self-pay

## 2021-03-27 ENCOUNTER — Other Ambulatory Visit: Payer: Self-pay

## 2021-03-28 ENCOUNTER — Inpatient Hospital Stay: Payer: Medicaid Other | Admitting: Internal Medicine

## 2021-03-28 NOTE — Telephone Encounter (Signed)
No I have not. I don't know that they would contact me. Since there has been a recall on some machines causing a shortage some assistance programs are on hold.

## 2021-03-28 NOTE — Telephone Encounter (Signed)
Attempted again to contact [facility] as patient saw Dr Laural Benes 03/19/2021. Message left with call back requested to this CM or Jay[facility]

## 2021-03-28 NOTE — Telephone Encounter (Signed)
Thanks for the update! Appreciate it.   Alver Sorrow, NP

## 2021-03-30 ENCOUNTER — Emergency Department (HOSPITAL_COMMUNITY): Payer: Self-pay

## 2021-03-30 ENCOUNTER — Emergency Department (HOSPITAL_COMMUNITY)
Admission: EM | Admit: 2021-03-30 | Discharge: 2021-03-30 | Disposition: A | Discharge status: Home / Self Care | Payer: Self-pay | Attending: Emergency Medicine | Admitting: Emergency Medicine

## 2021-03-30 DIAGNOSIS — Z79899 Other long term (current) drug therapy: Secondary | ICD-10-CM | POA: Insufficient documentation

## 2021-03-30 DIAGNOSIS — N390 Urinary tract infection, site not specified: Secondary | ICD-10-CM | POA: Insufficient documentation

## 2021-03-30 DIAGNOSIS — Z794 Long term (current) use of insulin: Secondary | ICD-10-CM | POA: Insufficient documentation

## 2021-03-30 DIAGNOSIS — E1142 Type 2 diabetes mellitus with diabetic polyneuropathy: Secondary | ICD-10-CM | POA: Insufficient documentation

## 2021-03-30 DIAGNOSIS — I4891 Unspecified atrial fibrillation: Secondary | ICD-10-CM | POA: Insufficient documentation

## 2021-03-30 DIAGNOSIS — Z7984 Long term (current) use of oral hypoglycemic drugs: Secondary | ICD-10-CM | POA: Insufficient documentation

## 2021-03-30 DIAGNOSIS — Z7901 Long term (current) use of anticoagulants: Secondary | ICD-10-CM | POA: Insufficient documentation

## 2021-03-30 DIAGNOSIS — D72829 Elevated white blood cell count, unspecified: Secondary | ICD-10-CM | POA: Insufficient documentation

## 2021-03-30 DIAGNOSIS — Z20822 Contact with and (suspected) exposure to covid-19: Secondary | ICD-10-CM | POA: Insufficient documentation

## 2021-03-30 DIAGNOSIS — I110 Hypertensive heart disease with heart failure: Secondary | ICD-10-CM | POA: Insufficient documentation

## 2021-03-30 DIAGNOSIS — I5032 Chronic diastolic (congestive) heart failure: Secondary | ICD-10-CM | POA: Insufficient documentation

## 2021-03-30 LAB — COMPREHENSIVE METABOLIC PANEL
ALT: 14 U/L (ref 0–44)
AST: 17 U/L (ref 15–41)
Albumin: 3.2 g/dL — ABNORMAL LOW (ref 3.5–5.0)
Alkaline Phosphatase: 146 U/L — ABNORMAL HIGH (ref 38–126)
Anion gap: 12 (ref 5–15)
BUN: 29 mg/dL — ABNORMAL HIGH (ref 8–23)
CO2: 19 mmol/L — ABNORMAL LOW (ref 22–32)
Calcium: 9 mg/dL (ref 8.9–10.3)
Chloride: 99 mmol/L (ref 98–111)
Creatinine, Ser: 1.28 mg/dL — ABNORMAL HIGH (ref 0.44–1.00)
GFR, Estimated: 47 mL/min — ABNORMAL LOW (ref >60)
Glucose, Bld: 341 mg/dL — ABNORMAL HIGH (ref 70–99)
Potassium: 4.1 mmol/L (ref 3.5–5.1)
Sodium: 130 mmol/L — ABNORMAL LOW (ref 135–145)
Total Bilirubin: 0.5 mg/dL (ref 0.3–1.2)
Total Protein: 7.1 g/dL (ref 6.5–8.1)

## 2021-03-30 LAB — CBC
HCT: 39.2 % (ref 36.0–46.0)
Hemoglobin: 13.1 g/dL (ref 12.0–15.0)
MCH: 28.9 pg (ref 26.0–34.0)
MCHC: 33.4 g/dL (ref 30.0–36.0)
MCV: 86.3 fL (ref 80.0–100.0)
Platelets: 218 10*3/uL (ref 150–400)
RBC: 4.54 MIL/uL (ref 3.87–5.11)
RDW: 13.2 % (ref 11.5–15.5)
WBC: 13.9 10*3/uL — ABNORMAL HIGH (ref 4.0–10.5)
nRBC: 0 % (ref 0.0–0.2)

## 2021-03-30 LAB — URINALYSIS, ROUTINE W REFLEX MICROSCOPIC
Bilirubin Urine: NEGATIVE
Glucose, UA: >=500 mg/dL — AB
Ketones, ur: NEGATIVE mg/dL
Nitrite: POSITIVE — AB
Protein, ur: 100 mg/dL — AB
Specific Gravity, Urine: 1.021 (ref 1.005–1.030)
WBC, UA: >50 WBC/hpf — ABNORMAL HIGH (ref 0–5)
pH: 5 (ref 5.0–8.0)

## 2021-03-30 LAB — LIPASE, BLOOD: Lipase: 44 U/L (ref 11–51)

## 2021-03-30 LAB — TROPONIN I (HIGH SENSITIVITY)
Troponin I (High Sensitivity): 11 ng/L (ref <18)
Troponin I (High Sensitivity): 12 ng/L (ref <18)

## 2021-03-30 LAB — RESP PANEL BY RT-PCR (FLU A&B, COVID) ARPGX2
Influenza A by PCR: NEGATIVE
Influenza B by PCR: NEGATIVE
SARS Coronavirus 2 by RT PCR: NEGATIVE

## 2021-03-30 LAB — BRAIN NATRIURETIC PEPTIDE: B Natriuretic Peptide: 189.8 pg/mL — ABNORMAL HIGH (ref 0.0–100.0)

## 2021-03-30 LAB — PROTIME-INR
INR: 1.1 (ref 0.8–1.2)
Prothrombin Time: 14 seconds (ref 11.4–15.2)

## 2021-03-30 LAB — APTT: aPTT: 33 seconds (ref 24–36)

## 2021-03-30 LAB — LACTIC ACID, PLASMA
Lactic Acid, Venous: 1.7 mmol/L (ref 0.5–1.9)
Lactic Acid, Venous: 1.8 mmol/L (ref 0.5–1.9)

## 2021-03-30 LAB — CBG MONITORING, ED: Glucose-Capillary: 348 mg/dL — ABNORMAL HIGH (ref 70–99)

## 2021-03-30 MED ORDER — CEPHALEXIN 500 MG PO CAPS: 500.0000 mg | ORAL_CAPSULE | Freq: Three times a day (TID) | ORAL | 0 refills | Status: DC

## 2021-03-30 MED ORDER — SODIUM CHLORIDE 0.9 % IV BOLUS
500.0000 mL | Freq: Once | INTRAVENOUS | Status: AC
  Administered 2021-03-30: 500 mL via INTRAVENOUS

## 2021-03-30 MED ORDER — METOPROLOL SUCCINATE ER 25 MG PO TB24
100.0000 mg | ORAL_TABLET | Freq: Once | ORAL | Status: AC
  Administered 2021-03-30: 100 mg via ORAL
  Filled 2021-03-30: qty 4

## 2021-03-30 MED ORDER — SODIUM CHLORIDE 0.9 % IV SOLN
1.0000 g | Freq: Once | INTRAVENOUS | Status: AC
  Administered 2021-03-30: 1 g via INTRAVENOUS
  Filled 2021-03-30: qty 10

## 2021-03-30 NOTE — ED Provider Notes (Signed)
Emergency Medicine Provider Triage Evaluation Note  Emily Farmer , a 65 y.o. female  was evaluated in triage.  Pt complains of fever, chills, and body aches around 11pm.  Pt reports she got her flu shot 2 weeks ago and thought this may be the cause, but became achy enough that she called 911.  They found her to be in a-fib with RVR. Cardizem was given PTA.  Pt reports headache and generally feeling poorly.  No known sick contacts.  Denies CP, SOB, dizziness, syncope, dysuria, cough.  Review of Systems  Positive: Fever, achy, headache Negative: N/V/D  Physical Exam  BP 104/71 (BP Location: Left Arm)   Pulse 91   Temp (!) 97.5 F (36.4 C) (Oral)   Resp 18   SpO2 96%  Gen:   Awake, no distress   Resp:  Normal effort, clear and equal breath sounds  MSK:   Moves extremities without difficulty  Other:  Pale, tachycardia - irregularly irregular  Medical Decision Making  Medically screening exam initiated at 5:18 AM.  Appropriate orders placed.  Emily Farmer was informed that the remainder of the evaluation will be completed by another provider, this initial triage assessment does not replace that evaluation, and the importance of remaining in the ED until their evaluation is complete.  Fever, body aches, a-fib with RVR.  Labs and imaging pending.  Pt will need next room.     Emily Farmer, Emily Farmer 03/30/21 0569    Tilden Fossa, MD 03/30/21 [phone]

## 2021-03-30 NOTE — ED Triage Notes (Signed)
Pt here via EMS with c/o SOB and body aches. Recently had flu shot. EMS reports Afib RVR with rate of 212. Gave 10mg  Cardizem with some relief of 122.   138/82 HR 122 CBG 305 95% RA RR 16  20LFA

## 2021-03-30 NOTE — ED Notes (Signed)
Pt verbalized understanding of discharge paperwork, prescriptions and follow-up care. Cab called per pts request.

## 2021-03-30 NOTE — ED Provider Notes (Signed)
[facility] EMERGENCY DEPARTMENT Provider Note   CSN: [id number] Arrival date & time: 03/30/21  5188     History Chief Complaint  Patient presents with   Shortness of Breath    Emily Farmer is a 65 y.o. female.  Presents to ER with concern for shortness of breath, palpitations.  Patient reports that around 11:00 she felt chills, body aches and palpitations and short of breath.  Felt like her A. fib was out of control.  EMS reported that heart rate was elevated and they gave dose of Cardizem prior to arrival.  Patient states that her symptoms have all cleared up and she now has no ongoing symptoms.  Denies any chest pain or difficulty in breathing at present.  No further chills or fevers.  No pain with urination or burning with urination.  No abdominal pain nausea or vomiting.  Has history of chronic atrial fibrillation, on Eliquis, diastolic heart failure, diabetes, hypertension, hyperlipidemia.  Reviewed recent cardiology note, BP was soft but patient asymptomatic at the time and was continued on her metoprolol and lisinopril.  HPI     Past Medical History:  Diagnosis Date   Arthritis    Atrial fibrillation ([place])    a. 09/2016 in setting of pancreatitis;  b. 09/2016 Echo: EF 65-60%, no rwma, Gr2 DD, mild MR, triv TR, PASP 4mHg;  CHA2DS2VASc = 4-->Eliquis 545mBID. c. Recurred 06/2018 in setting of GIB.   Chronic diastolic CHF (congestive heart failure) (HCPolk[place]8/15/2019   Diabetes mellitus    GI bleeding 06/2018   Gout    Hyperlipidemia    Hypertension    Hypertriglyceridemia    Pancreatitis    a. 09/2016 - Triglycerides 1,392 on admission.    Patient Active Problem List   Diagnosis Date Noted   Pulmonary hypertension (HC[facility]10/09/2020   Hypomagnesemia    Sepsis (HC[place]09/14/2022   Atrial fibrillation with rapid ventricular response (HCDed[place]09/14/2022   Hypotension 02/27/2021   Secondary hypercoagulable state (HCRound Hill [facility]11/23/2021   Type 2 diabetes mellitus with  diabetic polyneuropathy, with long-term current use of insulin (HCGuayn[place]04/02/2020   Type 2 diabetes mellitus with hyperglycemia, with long-term current use of insulin (HCPerdido [place]04/02/2020   Dyslipidemia 09/23/2019   Toenail fungus 07/19/2018   Depression 07/19/2018   Iron deficiency anemia due to chronic blood loss 07/19/2018   Gastritis and duodenitis 07/19/2018   Chronic a-fib (HCTull[place]08/15/2019   Hyponatremia 01/28/2018   Chronic diastolic CHF (congestive heart failure) (HCStroudsburg08/15/2019   Mixed hyperlipidemia 06/01/2017   Paroxysmal A-fib (HCFair[place]04/21/2018   Hypokalemia 01/04/2014   Pyelonephritis, acute 02/15/2013   Essential hypertension 02/15/2013   Hypertriglyceridemia 02/15/2013   Diabetes mellitus type 2 in obese (HCBoliv[place]09/07/2012   Coronary atherosclerosis seen on CT 02/15/2013    Past Surgical History:  Procedure Laterality Date   BIOPSY  06/20/2018   Procedure: BIOPSY;  Surgeon: MaClarene EssexMD;  [facility]: [facility] Service: Endoscopy;;   CHOLECYSTECTOMY N/A 01/04/2014   Procedure: LAPAROSCOPIC CHOLECYSTECTOMY WITH INTRAOPERATIVE CHOLANGIOGRAM;  Surgeon: ArRalene OkMD;  [facility] Service: General;  Laterality: N/A;   COLONOSCOPY WITH PROPOFOL N/A 06/21/2018   Procedure: COLONOSCOPY WITH PROPOFOL;  Surgeon: BuRonald LoboMD;  [facility];  Service: Endoscopy;  Laterality: N/A;   ESOPHAGOGASTRODUODENOSCOPY (EGD) WITH PROPOFOL N/A 06/20/2018   Procedure: ESOPHAGOGASTRODUODENOSCOPY (EGD) WITH PROPOFOL;  Surgeon: MaClarene EssexMD;  Location: [facility] Service: Endoscopy;  Laterality: N/A;   LUNG SURGERY  OB History   No obstetric history on file.     Family History  Problem Relation Age of Onset   Heart attack Mother    Heart attack Father    Diabetes Sister    High blood pressure Neg Hx    High Cholesterol Neg Hx     Social History   Tobacco Use   Smoking status: Never   Smokeless tobacco: Never  Vaping Use   Vaping Use: Never used   Substance Use Topics   Alcohol use: No   Drug use: No    Home Medications Prior to Admission medications   Medication Sig Start Date End Date Taking? Authorizing Provider  cephALEXin (KEFLEX) 500 MG capsule Take 1 capsule (500 mg total) by mouth 3 (three) times daily for 7 days. 03/30/21 04/06/21 Yes Loyce Klasen, Ellwood Dense, MD  acetaminophen (TYLENOL) 325 MG tablet Take 2 tablets (650 mg total) by mouth every 6 (six) hours as needed for mild pain (or Fever >/= 101). 02/01/18   Elgergawy, Silver Huguenin, MD  apixaban (ELIQUIS) 5 MG TABS tablet Take 1 tablet (5 mg total) by mouth 2 (two) times daily. 10/20/20 03/21/21  Corena Herter, PA-C  atorvastatin (LIPITOR) 40 MG tablet Take 1 tablet (40 mg total) by mouth daily. 03/21/21   Loel Dubonnet, NP  Blood Glucose Monitoring Suppl (TRUE METRIX METER) w/Device KIT Use to monitor blood sugar as directed Patient taking differently: 1 each by Other route See admin instructions. Use to monitor blood sugar as directed 02/22/18   Fulp, Cammie, MD  ezetimibe (ZETIA) 10 MG tablet Take 1 tablet (10 mg total) by mouth daily. To help lower lipids 03/21/21   Loel Dubonnet, NP  ferrous sulfate 325 (65 FE) MG tablet TAKE 1 TABLET (325 MG TOTAL) BY MOUTH 2 (TWO) TIMES DAILY WITH A MEAL. Patient taking differently: Take 325 mg by mouth 2 (two) times daily with a meal. 05/09/20 05/09/21  Ladell Pier, MD  glucose blood test strip USE AS INSTRUCTED TO CHECK BLOOD SUGARS 3 TIMES DAILY Patient taking differently: 1 each by Other route See admin instructions. USE AS INSTRUCTED TO CHECK BLOOD SUGARS 3 TIMES DAILY 03/27/20   Fulp, Cammie, MD  Insulin Glargine (BASAGLAR KWIKPEN) 100 UNIT/ML Inject 27 Units into the skin at bedtime. 03/19/21   Ladell Pier, MD  insulin lispro (HUMALOG KWIKPEN) 100 UNIT/ML KwikPen Inject 0.1 mLs (10 Units total) into the skin 3 (three) times daily. 12/23/19   Shamleffer, Melanie Crazier, MD  Insulin Syringe-Needle U-100 (TRUEPLUS INSULIN  SYRINGE) 31G X 5/16" 0.3 ML MISC Use to inject insulin daily. Patient taking differently: 1 each by Other route See admin instructions. Use to inject insulin daily. 05/06/18   Fulp, Cammie, MD  liraglutide (VICTOZA) 18 MG/3ML SOPN Inject 1.2 mg into the skin daily. 03/19/21   Ladell Pier, MD  lisinopril (ZESTRIL) 5 MG tablet Take 1 tablet (5 mg total) by mouth daily. 03/19/21   Ladell Pier, MD  metFORMIN (GLUCOPHAGE) 500 MG tablet Take 0.5 tablets (250 mg total) by mouth 4 (four) times daily. 09/27/19   Ladell Pier, MD  metoprolol succinate (TOPROL-XL) 100 MG 24 hr tablet Take 1 tablet (100 mg total) by mouth 2 (two) times daily. Take with or immediately following a meal. 03/21/21   Loel Dubonnet, NP  Multiple Vitamins-Minerals (CENTRUM SILVER PO) Take 1 tablet by mouth daily.    [provider]  mupirocin ointment (BACTROBAN) 2 % Place 1 application  into the nose 2 (two) times daily. 07/19/18   Elsie Stain, MD    Allergies    Patient has no known allergies.  Review of Systems   Review of Systems  Constitutional:  Positive for chills and fatigue. Negative for fever.  HENT:  Negative for ear pain and sore throat.   Eyes:  Negative for pain and visual disturbance.  Respiratory:  Positive for shortness of breath. Negative for cough.   Cardiovascular:  Positive for palpitations. Negative for chest pain.  Gastrointestinal:  Negative for abdominal pain and vomiting.  Genitourinary:  Negative for dysuria and hematuria.  Musculoskeletal:  Negative for arthralgias and back pain.  Skin:  Negative for color change and rash.  Neurological:  Negative for seizures and syncope.  All other systems reviewed and are negative.  Physical Exam Updated Vital Signs BP (!) 129/91   Pulse 100   Temp 98.2 F (36.8 C) (Oral)   Resp 15   Ht _0  (1.6 m)   Wt 74.8 kg   SpO2 97%   BMI 29.23 kg/m   Physical Exam Vitals and nursing note reviewed.  Constitutional:       General: She is not in acute distress.    Appearance: She is well-developed.  HENT:     Head: Normocephalic and atraumatic.  Eyes:     Conjunctiva/sclera: Conjunctivae normal.  Cardiovascular:     Rate and Rhythm: Normal rate and regular rhythm.     Heart sounds: No murmur heard. Pulmonary:     Effort: Pulmonary effort is normal. No respiratory distress.     Breath sounds: Normal breath sounds.  Abdominal:     Palpations: Abdomen is soft.     Tenderness: There is no abdominal tenderness.  Musculoskeletal:     Cervical back: Neck supple.  Skin:    General: Skin is warm and dry.  Neurological:     General: No focal deficit present.     Mental Status: She is alert.  Psychiatric:        Mood and Affect: Mood normal.        Behavior: Behavior normal.    ED Results / Procedures / Treatments   Labs (all labs ordered are listed, but only abnormal results are displayed) Labs Reviewed  CBC - Abnormal; Notable for the following components:      Result Value   WBC 13.9 (*)    All other components within normal limits  COMPREHENSIVE METABOLIC PANEL - Abnormal; Notable for the following components:   Sodium 130 (*)    CO2 19 (*)    Glucose, Bld 341 (*)    BUN 29 (*)    Creatinine, Ser 1.28 (*)    Albumin 3.2 (*)    Alkaline Phosphatase 146 (*)    GFR, Estimated 47 (*)    All other components within normal limits  URINALYSIS, ROUTINE W REFLEX MICROSCOPIC - Abnormal; Notable for the following components:   APPearance CLOUDY (*)    Glucose, UA >=500 (*)    Hgb urine dipstick SMALL (*)    Protein, ur 100 (*)    Nitrite POSITIVE (*)    Leukocytes,Ua MODERATE (*)    WBC, UA >50 (*)    Bacteria, UA MANY (*)    All other components within normal limits  BRAIN NATRIURETIC PEPTIDE - Abnormal; Notable for the following components:   B Natriuretic Peptide 189.8 (*)    All other components within normal limits  CBG MONITORING, ED - Abnormal; Notable  for the following components:    Glucose-Capillary 348 (*)    All other components within normal limits  RESP PANEL BY RT-PCR (FLU A&B, COVID) ARPGX2  CULTURE, BLOOD (SINGLE)  URINE CULTURE  LACTIC ACID, PLASMA  LACTIC ACID, PLASMA  PROTIME-INR  APTT  LIPASE, BLOOD  TROPONIN I (HIGH SENSITIVITY)  TROPONIN I (HIGH SENSITIVITY)    EKG EKG Interpretation  Date/Time:  Saturday March 30 2021 05:11:47 EDT Ventricular Rate:  122 PR Interval:    QRS Duration: 66 QT Interval:  316 QTC Calculation: 450 R Axis:   6 Text Interpretation: Atrial fibrillation with rapid ventricular response with premature ventricular or aberrantly conducted complexes Nonspecific T wave abnormality Abnormal ECG Confirmed by Madalyn Rob [phone]) on 03/30/2021 7:06:06 AM  Radiology DG Chest 2 View  Result Date: 03/30/2021 CLINICAL DATA:  Atrial fibrillation. Shortness of breath and body aches. EXAM: CHEST - 2 VIEW COMPARISON:  Chest x-rays dated 02/26/2021 and 01/03/2021. FINDINGS: Heart size and mediastinal contours are stable. Lungs are clear. No pleural effusion or pneumothorax is seen. Surgical clips overlying the RIGHT chest wall. Osseous structures about the chest are unremarkable. IMPRESSION: No active cardiopulmonary disease. No evidence of pneumonia or pulmonary edema. Electronically Signed   By: Franki Cabot M.D.   On: 03/30/2021 06:20    Procedures Procedures   Medications Ordered in ED Medications  metoprolol succinate (TOPROL-XL) 24 hr tablet 100 mg (100 mg Oral Given 03/30/21 0807)  cefTRIAXone (ROCEPHIN) 1 g in sodium chloride 0.9 % 100 mL IVPB (0 g Intravenous Stopped 03/30/21 0841)  sodium chloride 0.9 % bolus 500 mL (500 mLs Intravenous New Bag/Given 03/30/21 6578)    ED Course  I have reviewed the triage vital signs and the nursing notes.  Pertinent labs & imaging results that were available during my care of the patient were reviewed by me and considered in my medical decision making (see chart for details).     MDM Rules/Calculators/A&P                           65 year old lady presents to ER after having episode of palpitations, shortness of breath, malaise.  Was found to be in A. fib with RVR, had received dose of Cardizem by EMS.  Heart rate significantly improved.  Patient now reports that she is asymptomatic.  Work-up noted for mild leukocytosis, likely urinary tract infection.  Suspect this may be trigger for episode last night.  We will start antibiotics, give small bolus of fluid and home metoprolol dose and further observe in ER.  On reassessment, patient appears well in no distress.  Her vital signs remained stable, heart rate is in 90s.  She denies any ongoing medical complaints.  Given no ongoing complaints, stable vitals, believe she can be managed in the outpatient setting.  Will give course of Keflex for UTI.  Instructed to follow-up closely with primary doctor.  Reviewed strict return precautions with patient and discharged.  After the discussed management above, the patient was determined to be safe for discharge.  The patient was in agreement with this plan and all questions regarding their care were answered.  ED return precautions were discussed and the patient will return to the ED with any significant worsening of condition.    Final Clinical Impression(s) / ED Diagnoses Final diagnoses:  Atrial fibrillation, unspecified type ([place])  Leukocytosis, unspecified type  Urinary tract infection without hematuria, site unspecified    Rx / DC Orders  ED Discharge Orders          Ordered    cephALEXin (KEFLEX) 500 MG capsule  3 times daily        03/30/21 0926             Lucrezia Starch, MD 03/30/21 [phone]

## 2021-03-30 NOTE — Discharge Instructions (Addendum)
Please contact your primary care doctor on Monday to request early follow-up appointment, ideally for recheck on Monday or Tuesday.  Take the antibiotic as prescribed for your suspected urinary tract infection.  If you have any episodes of chest pain, palpitations, fevers, nausea, vomiting, return immediately to ER for reassessment.

## 2021-03-31 ENCOUNTER — Encounter (HOSPITAL_COMMUNITY): Payer: Self-pay | Admitting: Emergency Medicine

## 2021-03-31 ENCOUNTER — Emergency Department (HOSPITAL_COMMUNITY): Payer: Self-pay

## 2021-03-31 ENCOUNTER — Inpatient Hospital Stay (HOSPITAL_COMMUNITY)
Admission: EM | Admit: 2021-03-31 | Discharge: 2021-04-03 | DRG: 872 | Disposition: A | Discharge status: Home Health | Payer: Self-pay | Attending: Family Medicine | Admitting: Family Medicine

## 2021-03-31 ENCOUNTER — Other Ambulatory Visit: Payer: Self-pay

## 2021-03-31 DIAGNOSIS — N12 Tubulo-interstitial nephritis, not specified as acute or chronic: Secondary | ICD-10-CM | POA: Diagnosis present

## 2021-03-31 DIAGNOSIS — E669 Obesity, unspecified: Secondary | ICD-10-CM | POA: Diagnosis present

## 2021-03-31 DIAGNOSIS — I110 Hypertensive heart disease with heart failure: Secondary | ICD-10-CM | POA: Diagnosis present

## 2021-03-31 DIAGNOSIS — Z8249 Family history of ischemic heart disease and other diseases of the circulatory system: Secondary | ICD-10-CM

## 2021-03-31 DIAGNOSIS — Z6829 Body mass index (BMI) 29.0-29.9, adult: Secondary | ICD-10-CM

## 2021-03-31 DIAGNOSIS — R6520 Severe sepsis without septic shock: Secondary | ICD-10-CM | POA: Diagnosis present

## 2021-03-31 DIAGNOSIS — N10 Acute tubulo-interstitial nephritis: Secondary | ICD-10-CM

## 2021-03-31 DIAGNOSIS — Z79899 Other long term (current) drug therapy: Secondary | ICD-10-CM

## 2021-03-31 DIAGNOSIS — I272 Pulmonary hypertension, unspecified: Secondary | ICD-10-CM | POA: Diagnosis present

## 2021-03-31 DIAGNOSIS — Z7984 Long term (current) use of oral hypoglycemic drugs: Secondary | ICD-10-CM

## 2021-03-31 DIAGNOSIS — I081 Rheumatic disorders of both mitral and tricuspid valves: Secondary | ICD-10-CM | POA: Diagnosis present

## 2021-03-31 DIAGNOSIS — I480 Paroxysmal atrial fibrillation: Secondary | ICD-10-CM | POA: Diagnosis present

## 2021-03-31 DIAGNOSIS — E1165 Type 2 diabetes mellitus with hyperglycemia: Secondary | ICD-10-CM | POA: Diagnosis present

## 2021-03-31 DIAGNOSIS — E1169 Type 2 diabetes mellitus with other specified complication: Secondary | ICD-10-CM

## 2021-03-31 DIAGNOSIS — I2510 Atherosclerotic heart disease of native coronary artery without angina pectoris: Secondary | ICD-10-CM | POA: Diagnosis present

## 2021-03-31 DIAGNOSIS — E782 Mixed hyperlipidemia: Secondary | ICD-10-CM | POA: Diagnosis present

## 2021-03-31 DIAGNOSIS — N390 Urinary tract infection, site not specified: Secondary | ICD-10-CM

## 2021-03-31 DIAGNOSIS — Z20822 Contact with and (suspected) exposure to covid-19: Secondary | ICD-10-CM | POA: Diagnosis present

## 2021-03-31 DIAGNOSIS — D509 Iron deficiency anemia, unspecified: Secondary | ICD-10-CM | POA: Diagnosis present

## 2021-03-31 DIAGNOSIS — R7881 Bacteremia: Secondary | ICD-10-CM | POA: Diagnosis present

## 2021-03-31 DIAGNOSIS — I4891 Unspecified atrial fibrillation: Secondary | ICD-10-CM | POA: Diagnosis present

## 2021-03-31 DIAGNOSIS — R110 Nausea: Secondary | ICD-10-CM

## 2021-03-31 DIAGNOSIS — I5032 Chronic diastolic (congestive) heart failure: Secondary | ICD-10-CM | POA: Diagnosis present

## 2021-03-31 DIAGNOSIS — Z7901 Long term (current) use of anticoagulants: Secondary | ICD-10-CM

## 2021-03-31 DIAGNOSIS — I10 Essential (primary) hypertension: Secondary | ICD-10-CM

## 2021-03-31 DIAGNOSIS — Z794 Long term (current) use of insulin: Secondary | ICD-10-CM

## 2021-03-31 DIAGNOSIS — A4159 Other Gram-negative sepsis: Principal | ICD-10-CM | POA: Diagnosis present

## 2021-03-31 DIAGNOSIS — B961 Klebsiella pneumoniae [K. pneumoniae] as the cause of diseases classified elsewhere: Secondary | ICD-10-CM | POA: Diagnosis present

## 2021-03-31 DIAGNOSIS — Z833 Family history of diabetes mellitus: Secondary | ICD-10-CM

## 2021-03-31 DIAGNOSIS — E1142 Type 2 diabetes mellitus with diabetic polyneuropathy: Secondary | ICD-10-CM | POA: Diagnosis present

## 2021-03-31 LAB — BLOOD CULTURE ID PANEL (REFLEXED) - BCID2

## 2021-03-31 LAB — CBC WITH DIFFERENTIAL/PLATELET
Abs Immature Granulocytes: 0.04 10*3/uL (ref 0.00–0.07)
Basophils Absolute: 0 10*3/uL (ref 0.0–0.1)
Basophils Relative: 0 %
Eosinophils Absolute: 0 10*3/uL (ref 0.0–0.5)
Eosinophils Relative: 0 %
HCT: 35.3 % — ABNORMAL LOW (ref 36.0–46.0)
Hemoglobin: 11.2 g/dL — ABNORMAL LOW (ref 12.0–15.0)
Immature Granulocytes: 0 %
Lymphocytes Relative: 5 %
Lymphs Abs: 0.5 10*3/uL — ABNORMAL LOW (ref 0.7–4.0)
MCH: 28.4 pg (ref 26.0–34.0)
MCHC: 31.7 g/dL (ref 30.0–36.0)
MCV: 89.6 fL (ref 80.0–100.0)
Monocytes Absolute: 0.7 10*3/uL (ref 0.1–1.0)
Monocytes Relative: 7 %
Neutro Abs: 8.9 10*3/uL — ABNORMAL HIGH (ref 1.7–7.7)
Neutrophils Relative %: 88 %
Platelets: 160 10*3/uL (ref 150–400)
RBC: 3.94 MIL/uL (ref 3.87–5.11)
RDW: 13.3 % (ref 11.5–15.5)
WBC: 10.3 10*3/uL (ref 4.0–10.5)
nRBC: 0 % (ref 0.0–0.2)

## 2021-03-31 LAB — COMPREHENSIVE METABOLIC PANEL
ALT: 12 U/L (ref 0–44)
AST: 16 U/L (ref 15–41)
Albumin: 2.7 g/dL — ABNORMAL LOW (ref 3.5–5.0)
Alkaline Phosphatase: 153 U/L — ABNORMAL HIGH (ref 38–126)
Anion gap: 10 (ref 5–15)
BUN: 31 mg/dL — ABNORMAL HIGH (ref 8–23)
CO2: 19 mmol/L — ABNORMAL LOW (ref 22–32)
Calcium: 8.2 mg/dL — ABNORMAL LOW (ref 8.9–10.3)
Chloride: 104 mmol/L (ref 98–111)
Creatinine, Ser: 1.23 mg/dL — ABNORMAL HIGH (ref 0.44–1.00)
GFR, Estimated: 49 mL/min — ABNORMAL LOW (ref >60)
Glucose, Bld: 357 mg/dL — ABNORMAL HIGH (ref 70–99)
Potassium: 3.8 mmol/L (ref 3.5–5.1)
Sodium: 133 mmol/L — ABNORMAL LOW (ref 135–145)
Total Bilirubin: 0.7 mg/dL (ref 0.3–1.2)
Total Protein: 6.2 g/dL — ABNORMAL LOW (ref 6.5–8.1)

## 2021-03-31 LAB — URINALYSIS, ROUTINE W REFLEX MICROSCOPIC
Bacteria, UA: NONE SEEN
Bilirubin Urine: NEGATIVE
Glucose, UA: >=500 mg/dL — AB
Hgb urine dipstick: NEGATIVE
Ketones, ur: NEGATIVE mg/dL
Leukocytes,Ua: NEGATIVE
Nitrite: POSITIVE — AB
Protein, ur: 100 mg/dL — AB
Specific Gravity, Urine: 1.024 (ref 1.005–1.030)
pH: 5 (ref 5.0–8.0)

## 2021-03-31 LAB — LACTIC ACID, PLASMA: Lactic Acid, Venous: 2.1 mmol/L (ref 0.5–1.9)

## 2021-03-31 MED ORDER — LACTATED RINGERS IV BOLUS: 1000.0000 mL | Freq: Once | INTRAVENOUS | Status: DC

## 2021-03-31 MED ORDER — DILTIAZEM HCL 25 MG/5ML IV SOLN
5.0000 mg | Freq: Once | INTRAVENOUS | Status: AC
  Administered 2021-03-31: 5 mg via INTRAVENOUS
  Filled 2021-03-31: qty 5

## 2021-03-31 MED ORDER — METOPROLOL TARTRATE 25 MG PO TABS
25.0000 mg | ORAL_TABLET | Freq: Once | ORAL | Status: DC
  Filled 2021-03-31: qty 1

## 2021-03-31 MED ORDER — DILTIAZEM HCL 25 MG/5ML IV SOLN
5.0000 mg | Freq: Once | INTRAVENOUS | Status: AC
  Administered 2021-03-31: 5 mg via INTRAVENOUS

## 2021-03-31 MED ORDER — DILTIAZEM HCL-DEXTROSE 125-5 MG/125ML-% IV SOLN (PREMIX)
5.0000 mg/h | INTRAVENOUS | Status: DC
  Administered 2021-03-31: 5 mg/h via INTRAVENOUS
  Filled 2021-03-31: qty 125

## 2021-03-31 MED ORDER — LACTATED RINGERS IV BOLUS
1000.0000 mL | Freq: Once | INTRAVENOUS | Status: AC
  Administered 2021-03-31: 1000 mL via INTRAVENOUS

## 2021-03-31 MED ORDER — SODIUM CHLORIDE 0.9 % IV SOLN
1.0000 g | Freq: Once | INTRAVENOUS | Status: AC
  Administered 2021-03-31: 1 g via INTRAVENOUS
  Filled 2021-03-31: qty 10

## 2021-03-31 NOTE — ED Provider Notes (Addendum)
[facility] EMERGENCY DE[facility]MENT Provider Note   CSN: [id number] Arrival date & time: 03/31/21  2140     History Chief Complaint  Patient presents with   Fever   A-fib RVR    Emily Farmer is a 65 y.o. female.  65 year old female with a past medical history of A. fib on Eliquis, HFpEF, hypertension, hyperlipidemia, diabetes presents today for evaluation of palpitations of 1 day duration.  Patient was recently seen in this emergency room for A. fib RVR and was diagnosed with UTI.  Recent discharge on 9/18 for sepsis secondary to pyelonephritis.  Patient A. fib RVR resolved with bolus of diltiazem and she was diagnosed with UTI discharged on Keflex.  She reports she has not been able to pick up her medication because her pharmacy has been closed over the weekend.  She reports today she developed chills this morning and palpitations around supper time.  Currently she is resting comfortably without acute distress.  She does note palpitations, but denies chest pain, dyspnea, lightheadedness, abdominal pain, nausea.  She does report new onset left-sided flank pain as of today.  The history is provided by the patient. No language interpreter was used.      Past Medical History:  Diagnosis Date   Arthritis    Atrial fibrillation ([place])    a. 09/2016 in setting of pancreatitis;  b. 09/2016 Echo: EF 65-60%, no rwma, Gr2 DD, mild MR, triv TR, PASP 36mHg;  CHA2DS2VASc = 4-->Eliquis 519mBID. c. Recurred 06/2018 in setting of GIB.   Chronic diastolic CHF (congestive heart failure) ([facility]8/15/2019   Diabetes mellitus    GI bleeding 06/2018   Gout    Hyperlipidemia    Hypertension    Hypertriglyceridemia    Pancreatitis    a. 09/2016 - Triglycerides 1,392 on admission.    Patient Active Problem List   Diagnosis Date Noted   Pulmonary hypertension ([facility]10/09/2020   Hypomagnesemia    Sepsis ([facility]09/14/2022   Atrial fibrillation with rapid ventricular response ([facility] 02/27/2021   Hypotension 02/27/2021   Secondary hypercoagulable state ([facility]11/23/2021   Type 2 diabetes mellitus with diabetic polyneuropathy, with long-term current use of insulin ([facility]04/02/2020   Type 2 diabetes mellitus with hyperglycemia, with long-term current use of insulin ([facility]04/02/2020   Dyslipidemia 09/23/2019   Toenail fungus 07/19/2018   Depression 07/19/2018   Iron deficiency anemia due to chronic blood loss 07/19/2018   Gastritis and duodenitis 07/19/2018   Chronic a-fib ([facility]08/15/2019   Hyponatremia 01/28/2018   Chronic diastolic CHF (congestive heart failure) (HCGuy08/15/2019   Mixed hyperlipidemia 06/01/2017   Paroxysmal A-fib ([facility]CV[facility]04/21/2018   Hypokalemia 01/04/2014   Pyelonephritis, acute 02/15/2013   Essential hypertension 02/15/2013   Hypertriglyceridemia 02/15/2013   Diabetes mellitus type 2 in obese ([facility]CG[facility]09/07/2012   Coronary atherosclerosis seen on CT 02/15/2013    Past Surgical History:  Procedure Laterality Date   BIOPSY  06/20/2018   Procedure: BIOPSY;  Surgeon: MaClarene EssexMD;  [facility]: Endoscopy;;   CHOLECYSTECTOMY N/A 01/04/2014   Procedure: LAPAROSCOPIC CHOLECYSTECTOMY WITH INTRAOPERATIVE CHOLANGIOGRAM;  Surgeon: ArRalene OkMD;  [facility] Service: General;  Laterality: N/A;   COLONOSCOPY WITH PROPOFOL N/A 06/21/2018   Procedure: COLONOSCOPY WITH PROPOFOL;  Surgeon: BuRonald LoboMD;  [facility];  Service: Endoscopy;  Laterality: N/A;   ESOPHAGOGASTRODUODENOSCOPY (EGD) WITH PROPOFOL N/A 06/20/2018   Procedure: ESOPHAGOGASTRODUODENOSCOPY (EGD) WITH PROPOFOL;  Surgeon: MaClarene EssexMD;  Location:  [facility] ENDOSCOPY;  Service: Endoscopy;  Laterality: N/A;   LUNG SURGERY       OB History   No obstetric history on file.     Family History  Problem Relation Age of Onset   Heart attack Mother    Heart attack Father    Diabetes Sister    High blood pressure Neg Hx    High Cholesterol Neg Hx      Social History   Tobacco Use   Smoking status: Never   Smokeless tobacco: Never  Vaping Use   Vaping Use: Never used  Substance Use Topics   Alcohol use: No   Drug use: No    Home Medications Prior to Admission medications   Medication Sig Start Date End Date Taking? Authorizing Provider  acetaminophen (TYLENOL) 325 MG tablet Take 2 tablets (650 mg total) by mouth every 6 (six) hours as needed for mild pain (or Fever >/= 101). 02/01/18   Elgergawy, Silver Huguenin, MD  apixaban (ELIQUIS) 5 MG TABS tablet Take 1 tablet (5 mg total) by mouth 2 (two) times daily. 10/20/20 03/21/21  Corena Herter, PA-C  atorvastatin (LIPITOR) 40 MG tablet Take 1 tablet (40 mg total) by mouth daily. 03/21/21   Loel Dubonnet, NP  Blood Glucose Monitoring Suppl (TRUE METRIX METER) w/Device KIT Use to monitor blood sugar as directed Patient taking differently: 1 each by Other route See admin instructions. Use to monitor blood sugar as directed 02/22/18   Fulp, Cammie, MD  cephALEXin (KEFLEX) 500 MG capsule Take 1 capsule (500 mg total) by mouth 3 (three) times daily for 7 days. 03/30/21 04/06/21  Lucrezia Starch, MD  ezetimibe (ZETIA) 10 MG tablet Take 1 tablet (10 mg total) by mouth daily. To help lower lipids 03/21/21   Loel Dubonnet, NP  ferrous sulfate 325 (65 FE) MG tablet TAKE 1 TABLET (325 MG TOTAL) BY MOUTH 2 (TWO) TIMES DAILY WITH A MEAL. Patient taking differently: Take 325 mg by mouth 2 (two) times daily with a meal. 05/09/20 05/09/21  Ladell Pier, MD  glucose blood test strip USE AS INSTRUCTED TO CHECK BLOOD SUGARS 3 TIMES DAILY Patient taking differently: 1 each by Other route See admin instructions. USE AS INSTRUCTED TO CHECK BLOOD SUGARS 3 TIMES DAILY 03/27/20   Fulp, Cammie, MD  Insulin Glargine (BASAGLAR KWIKPEN) 100 UNIT/ML Inject 27 Units into the skin at bedtime. 03/19/21   Ladell Pier, MD  insulin lispro (HUMALOG KWIKPEN) 100 UNIT/ML KwikPen Inject 0.1 mLs (10 Units total)  into the skin 3 (three) times daily. 12/23/19   Shamleffer, Melanie Crazier, MD  Insulin Syringe-Needle U-100 (TRUEPLUS INSULIN SYRINGE) 31G X 5/16" 0.3 ML MISC Use to inject insulin daily. Patient taking differently: 1 each by Other route See admin instructions. Use to inject insulin daily. 05/06/18   Fulp, Cammie, MD  liraglutide (VICTOZA) 18 MG/3ML SOPN Inject 1.2 mg into the skin daily. 03/19/21   Ladell Pier, MD  lisinopril (ZESTRIL) 5 MG tablet Take 1 tablet (5 mg total) by mouth daily. 03/19/21   Ladell Pier, MD  metFORMIN (GLUCOPHAGE) 500 MG tablet Take 0.5 tablets (250 mg total) by mouth 4 (four) times daily. 09/27/19   Ladell Pier, MD  metoprolol succinate (TOPROL-XL) 100 MG 24 hr tablet Take 1 tablet (100 mg total) by mouth 2 (two) times daily. Take with or immediately following a meal. 03/21/21   Loel Dubonnet, NP  Multiple Vitamins-Minerals (CENTRUM SILVER PO) Take 1 tablet  by mouth daily.    [provider]  mupirocin ointment (BACTROBAN) 2 % Place 1 application into the nose 2 (two) times daily. 07/19/18   Elsie Stain, MD    Allergies    Patient has no known allergies.  Review of Systems   Review of Systems  Constitutional:  Positive for chills and fever. Negative for activity change and appetite change.  Eyes:  Negative for visual disturbance.  Respiratory:  Negative for chest tightness and shortness of breath.   Cardiovascular:  Positive for palpitations. Negative for chest pain.  Gastrointestinal:  Negative for abdominal pain, diarrhea, nausea and vomiting.  Genitourinary:  Positive for flank pain. Negative for difficulty urinating and dysuria.  Neurological:  Negative for light-headedness.  All other systems reviewed and are negative.  Physical Exam Updated Vital Signs BP 96/63   Pulse 91   Temp 99.2 F (37.3 C) (Oral)   Resp (!) 32   Ht _0  (1.6 m)   Wt 75 kg   SpO2 91%   BMI 29.29 kg/m   Physical Exam Vitals and nursing  note reviewed.  Constitutional:      General: She is not in acute distress.    Appearance: Normal appearance. She is not ill-appearing.  HENT:     Head: Normocephalic and atraumatic.     Nose: Nose normal.  Eyes:     General: No scleral icterus.    Extraocular Movements: Extraocular movements intact.     Conjunctiva/sclera: Conjunctivae normal.  Cardiovascular:     Rate and Rhythm: Tachycardia present. Rhythm irregular.     Pulses: Normal pulses.     Heart sounds: Normal heart sounds.  Pulmonary:     Effort: Pulmonary effort is normal. No respiratory distress.     Breath sounds: Normal breath sounds. No wheezing or rales.  Abdominal:     General: There is no distension.     Palpations: Abdomen is soft.     Tenderness: There is no abdominal tenderness. There is left CVA tenderness. There is no right CVA tenderness or guarding.  Musculoskeletal:        General: Normal range of motion.     Cervical back: Normal range of motion.     Right lower leg: No edema.     Left lower leg: No edema.  Skin:    General: Skin is warm and dry.  Neurological:     General: No focal deficit present.     Mental Status: She is alert. Mental status is at baseline.    ED Results / Procedures / Treatments   Labs (all labs ordered are listed, but only abnormal results are displayed) Labs Reviewed  URINE CULTURE  CBC WITH DIFFERENTIAL/PLATELET  COMPREHENSIVE METABOLIC PANEL  URINALYSIS, ROUTINE W REFLEX MICROSCOPIC    EKG EKG Interpretation  Date/Time:  Sunday March 31 2021 21:46:57 EDT Ventricular Rate:  184 PR Interval:    QRS Duration: 70 QT Interval:  237 QTC Calculation: 415 R Axis:   -13 Text Interpretation: Atrial fibrillation with rapid V-rate Ventricular premature complex Repolarization abnormality, prob rate related No significant change since last tracing Confirmed by Blanchie Dessert [phone]) on 03/31/2021 10:10:17 PM  Radiology DG Chest 2 View  Result Date:  03/30/2021 CLINICAL DATA:  Atrial fibrillation. Shortness of breath and body aches. EXAM: CHEST - 2 VIEW COMPARISON:  Chest x-rays dated 02/26/2021 and 01/03/2021. FINDINGS: Heart size and mediastinal contours are stable. Lungs are clear. No pleural effusion or pneumothorax is seen. Surgical clips overlying  the RIGHT chest wall. Osseous structures about the chest are unremarkable. IMPRESSION: No active cardiopulmonary disease. No evidence of pneumonia or pulmonary edema. Electronically Signed   By: Franki Cabot M.D.   On: 03/30/2021 06:20    Procedures .Critical Care Performed by: Evlyn Courier, PA-C Authorized by: Evlyn Courier, PA-C   Critical care provider statement:    Critical care time (minutes):  40   Critical care was necessary to treat or prevent imminent or life-threatening deterioration of the following conditions:  Circulatory failure   Critical care was time spent personally by me on the following activities:  Ordering and performing treatments and interventions, ordering and review of laboratory studies, development of treatment plan with patient or surrogate, ordering and review of radiographic studies, re-evaluation of patient's condition, pulse oximetry, evaluation of patient's response to treatment, examination of patient and review of old charts   Medications Ordered in ED Medications  diltiazem (CARDIZEM) injection 5 mg (has no administration in time range)  lactated ringers bolus 1,000 mL (has no administration in time range)  lactated ringers bolus 1,000 mL (1,000 mLs Intravenous New Bag/Given 03/31/21 2204)    ED Course  I have reviewed the triage vital signs and the nursing notes.  Pertinent labs & imaging results that were available during my care of the patient were reviewed by me and considered in my medical decision making (see chart for details).    MDM Rules/Calculators/A&P                           65 year old female presents today for evaluation of  palpitations of 1 day duration.  Patient was recently discharged with diagnosis of UTI but she has not been able to pick up keflex.  Currently tachycardic at 150.  Will give diltiazem 5 mg.  BP soft, but stable at 96/65.  Labs and imaging ordered. Slow infusion of lactated ringers for pressure support. Will avoid large volume or rapid infusion given history of HFpEF.   rates improved to 130s to 140s 1 bolus of diltiazem 5 mg.  We will give 1 additional bolus.  BP remains stable.  Patient's urine culture from yesterday is growing Klebsiella pneumonia.  Of note patient recently discharged on 1/18 for sepsis secondary to pyelonephritis with urine culture at that time growing Klebsiella pneumonia as well.  Blood cultures ordered.  Ceftriaxone ordered.  CBC with WBC of 10.3, hgb 11.2, NA 133, cr 1.23, alk phos 153.  Lactic, UA pending.   Patient seen and discussed with Dr. Maryan Rued.  Low dose diltiazem started for rate increase to 150-160. BP remains stable with systolic of low 568L.   Discussed with family practice service who will evaluate patient for admission.    Final Clinical Impression(s) / ED Diagnoses Final diagnoses:  None    Rx / DC Orders ED Discharge Orders     None        Evlyn Courier, PA-C 03/31/21 2308    Evlyn Courier, PA-C 03/31/21 2313    Evlyn Courier, PA-C 03/31/21 2324    Blanchie Dessert, MD 04/10/21 1529

## 2021-03-31 NOTE — ED Triage Notes (Addendum)
Pt here from home via [facility] for fever and chills since 1830 tonight. Upon EMS arrival pt was in afib RVR w/ rate from 100-200 bpm, pt asymptomatic, no cp/shob but tachypneic in 30s. Pt had oral temp of 100 w/ ems. 22g L hand, NS given. CBG 456, per pt she checked it at 1830 and it was in the 100s. aox4

## 2021-03-31 NOTE — H&P (Addendum)
[facility] Admission History and Physical Service Pager: [phone]  Patient name: Emily Farmer Medical record number: [id number] Date of birth: [birth year] Age: 65 y.o. Gender: female  Primary Care Provider: Ladell Pier, MD Consultants: None Code Status: Full  Preferred Emergency Contact: Emily Farmer [phone]  Chief Complaint: Fever, chills and Palpitation  Assessment and Plan: Jonet Mathies is a 65 y.o. female presenting with palpitations . PMH is significant for HFpEF, T2DM, HLD, PAF and Iron deficiency anemia    Sepsis  Klebsiella Bacteremia  Pyelonephritis  Patient was recently seen two days ago in the ED for pyelonephritis.  She present today with flank pain associated with elevated temperature (99 F), chills and palpitations. Her exam was positive for left CVA tenderness.  On admission, patient was afebrile but tachypneic and tachycardiac. Her HR was in the 180 and improved to the 130s after she received diltiazem in the ED. Initial lab in the ED showed creatinine of 1.23 which is trending down from two days ago. Normal WBC and Hgb 11.2. Her lactic acid is trending up, currently 2.1, was 1.7 on admission . Urine culture and blood culture from previous lab in her last ED visit two days ago was positive for Klebsiella pneumonia. Patient's presentation, exam and lab findings are strongly suggestive of Klebsiella bacteria from a kidney source. Urine showing glucosuria, proteinuria and pyuria. Patient denies any dysuria, urine urgency or frequency.  -Admitted to [facility], Dr. Erin Hearing attending -IV Ceftriaxone 2g  -F/u urine culture  -F/u blood cultures -Morning AM lab: BMP, CBC   -Continue to trend Lactic acid  -Routine vitals  -FENGI: Heart healthy/carb modified   AFIB RVR  Hx of Paroxymal AFIB  EKG and monitor show AFIB RVR .Initial HR in the ED was 184 which improved to the 120s after she received diltiazem 5 mg x2. Started on Diltazem gtt  in ED. Her AFIb likely a manifestation of her Klebsiella bacteremia infection. Low threshold to consult cardiology should symptom worsen. She has a CHADVASc score of 4 which demonstrates a moderate-high risk of stroke. Takes home medication of Metoprolol 100 mg XR and Eliquis 5 mg. -Continue Cardizem gtt  -12 lead EKG AM  -Cardiorespiratory monitoring - Daily AM lab, CMP -Continue home medication of Eliquis      Uncontrolled Type II Diabetes  Glucose on admission was 357. Most recent hgb A1c was 12.8 (9/14). She is on home medication of Insulin Glargine 27unit at bed time, metformin 250 mg Q6H, and Victoza 1.2 mg daily. -QID CBGs -Semeglee 15 units QHS -moderate sliding scale insulin  -Heart healthy/carb modified diet   HFpEF Last Echo 02/28/21 showed EF of 55 to 60% with mild mitral and tricuspid valve regurgitation. On exam patient showed no signs of volume over load. - continue Cardiac monitoring   HLD On home medication of Zetia 56m daily and Lipitor 452mdaily -Continue home medication.   Iron deficiency anaemia  Hgb on admission was 11.2 and MCV 89.6. She is on home medication of ferrous sulfate 325 mg twice daily. Will hold iron supplement due to ongoing infectious process. -Hold home medication of ferrous sulfate 325   FEN/GI: Heart healthy/carb modified diet Prophylaxis: Continue home Eliquis  Disposition: Admit to progressive  History of Present Illness:  RoGuilianna Farmer a [age].o. female presenting with chills, fever and palpitation  Patient report that she was shaking sometime around 11am yesterday (10/15). She had some associated chills and bbody aches all over. The symptoms continued to  the next day and she had one isolated episode of non-bloody emesis around 5am in the morning. She endorses having fever, she didn't check her temperature but reported her body felt warm. She took Tylenol but unsure if it resolved the fever as she had no means to measure her  temperature. Patient denies any chest pain, dysuria or urinary frequency but she endorses left lower back pain, tenderness and dizziness when she stands from a sitting position. In addition she  reports having rigor which she described as feeling like milkshake.    Of note, patient was recently discharged from the ED two days ago for UTI. At the time of discharge she was instructed to to get her antibiotics (Keflex) from the pharmacy which she is to take for 3 days. Patient's never got her medication as she was informed it will be available for pick up on Monday. She was also admitted a month ago for Klebsiella bacteremia and UTI.    Review Of Systems: Per HPI with the following additions:   Review of Systems  Constitutional:  Positive for chills, fever and malaise/fatigue.  HENT:  Negative for hearing loss, sinus pain and sore throat.   Respiratory:  Positive for shortness of breath. Negative for cough, hemoptysis and sputum production.   Cardiovascular:  Positive for palpitations. Negative for chest pain and leg swelling.  Gastrointestinal:  Positive for vomiting. Negative for abdominal pain, blood in stool and diarrhea.  Genitourinary:  Positive for flank pain. Negative for dysuria, frequency, hematuria and urgency.  Skin:  Negative for rash.  Neurological:  Negative for weakness and headaches.  All other systems reviewed and are negative.   Patient Active Problem List   Diagnosis Date Noted   Pulmonary hypertension ([place]) 03/19/2021   Hypomagnesemia    Sepsis ([place]) 02/27/2021   Atrial fibrillation with rapid ventricular response ([place]) 02/27/2021   Hypotension 02/27/2021   Secondary hypercoagulable state ([place]) 05/08/2020   Type 2 diabetes mellitus with diabetic polyneuropathy, with long-term current use of insulin ([place]) 09/23/2019   Type 2 diabetes mellitus with hyperglycemia, with long-term current use of insulin ([place]) 09/23/2019   Dyslipidemia 09/23/2019   Toenail fungus 07/19/2018    Depression 07/19/2018   Iron deficiency anemia due to chronic blood loss 07/19/2018   Gastritis and duodenitis 07/19/2018   Chronic a-fib ([place]) 01/28/2018   Hyponatremia 01/28/2018   Chronic diastolic CHF (congestive heart failure) ([place]) 01/28/2018   Mixed hyperlipidemia 06/01/2017   Paroxysmal A-fib ([place]) 10/04/2016   Hypokalemia 01/04/2014   Pyelonephritis, acute 02/15/2013   Essential hypertension 02/15/2013   Hypertriglyceridemia 02/15/2013   Diabetes mellitus type 2 in obese ([place]) 02/15/2013   Coronary atherosclerosis seen on CT 02/15/2013    Past Medical History: Past Medical History:  Diagnosis Date   Arthritis    Atrial fibrillation ([place])    a. 09/2016 in setting of pancreatitis;  b. 09/2016 Echo: EF 65-60%, no rwma, Gr2 DD, mild MR, triv TR, PASP 18mHg;  CHA2DS2VASc = 4-->Eliquis 55mBID. c. Recurred 06/2018 in setting of GIB.   Chronic diastolic CHF (congestive heart failure) [place]15[place]   Diabetes mellitus    GI bleeding 06/2018   Gout    Hyperlipidemia    Hypertension    Hypertriglyceridemia    Pancreatitis    a. 09/2016 - Triglycerides 1,392 on admission.    Past Surgical History: Past Surgical History:  Procedure Laterality Date   BIOPSY  06/20/2018   Procedure: BIOPSY;  Surgeon: MaClarene EssexMD;  [facility]  Service: Endoscopy;;   CHOLECYSTECTOMY N/A 01/04/2014   Procedure: LAPAROSCOPIC CHOLECYSTECTOMY WITH INTRAOPERATIVE CHOLANGIOGRAM;  Surgeon: Ralene Ok, MD;  Location: [facility];  Service: General;  Laterality: N/A;   COLONOSCOPY WITH PROPOFOL N/A 06/21/2018   Procedure: COLONOSCOPY WITH PROPOFOL;  Surgeon: Ronald Lobo, MD;  Location: [facility];  Service: Endoscopy;  Laterality: N/A;   ESOPHAGOGASTRODUODENOSCOPY (EGD) WITH PROPOFOL N/A 06/20/2018   Procedure: ESOPHAGOGASTRODUODENOSCOPY (EGD) WITH PROPOFOL;  Surgeon: Clarene Essex, MD;  Location: [facility];  Service: Endoscopy;  Laterality: N/A;   LUNG SURGERY      Social  History: Social History   Tobacco Use   Smoking status: Never   Smokeless tobacco: Never  Vaping Use   Vaping Use: Never used  Substance Use Topics   Alcohol use: No   Drug use: No   Additional social history:   Please also refer to relevant sections of EMR.  Family History: Family History  Problem Relation Age of Onset   Heart attack Mother    Heart attack Father    Diabetes Sister    High blood pressure Neg Hx    High Cholesterol Neg Hx      Allergies and Medications: No Known Allergies No current facility-administered medications on file prior to encounter.   Current Outpatient Medications on File Prior to Encounter  Medication Sig Dispense Refill   acetaminophen (TYLENOL) 325 MG tablet Take 2 tablets (650 mg total) by mouth every 6 (six) hours as needed for mild pain (or Fever >/= 101).     apixaban (ELIQUIS) 5 MG TABS tablet Take 1 tablet (5 mg total) by mouth 2 (two) times daily. 60 tablet 0   atorvastatin (LIPITOR) 40 MG tablet Take 1 tablet (40 mg total) by mouth daily. 90 tablet 3   Blood Glucose Monitoring Suppl (TRUE METRIX METER) w/Device KIT Use to monitor blood sugar as directed (Patient taking differently: 1 each by Other route See admin instructions. Use to monitor blood sugar as directed) 1 kit 0   cephALEXin (KEFLEX) 500 MG capsule Take 1 capsule (500 mg total) by mouth 3 (three) times daily for 7 days. 21 capsule 0   ezetimibe (ZETIA) 10 MG tablet Take 1 tablet (10 mg total) by mouth daily. To help lower lipids 90 tablet 3   ferrous sulfate 325 (65 FE) MG tablet TAKE 1 TABLET (325 MG TOTAL) BY MOUTH 2 (TWO) TIMES DAILY WITH A MEAL. (Patient taking differently: Take 325 mg by mouth 2 (two) times daily with a meal.) 60 tablet 3   glucose blood test strip USE AS INSTRUCTED TO CHECK BLOOD SUGARS 3 TIMES DAILY (Patient taking differently: 1 each by Other route See admin instructions. USE AS INSTRUCTED TO CHECK BLOOD SUGARS 3 TIMES DAILY) 100 each 11   Insulin  Glargine (BASAGLAR KWIKPEN) 100 UNIT/ML Inject 27 Units into the skin at bedtime. 30 mL 6   insulin lispro (HUMALOG KWIKPEN) 100 UNIT/ML KwikPen Inject 0.1 mLs (10 Units total) into the skin 3 (three) times daily. 30 mL 11   Insulin Syringe-Needle U-100 (TRUEPLUS INSULIN SYRINGE) 31G X 5/16" 0.3 ML MISC Use to inject insulin daily. (Patient taking differently: 1 each by Other route See admin instructions. Use to inject insulin daily.) 100 each 11   liraglutide (VICTOZA) 18 MG/3ML SOPN Inject 1.2 mg into the skin daily. 6 mL 6   lisinopril (ZESTRIL) 5 MG tablet Take 1 tablet (5 mg total) by mouth daily. 90 tablet 3   metFORMIN (GLUCOPHAGE) 500 MG tablet Take 0.5  tablets (250 mg total) by mouth 4 (four) times daily. 60 tablet 2   metoprolol succinate (TOPROL-XL) 100 MG 24 hr tablet Take 1 tablet (100 mg total) by mouth 2 (two) times daily. Take with or immediately following a meal. 180 tablet 2   Multiple Vitamins-Minerals (CENTRUM SILVER PO) Take 1 tablet by mouth daily.     mupirocin ointment (BACTROBAN) 2 % Place 1 application into the nose 2 (two) times daily. 30 g 0    Objective: BP 103/70   Pulse (!) 154   Temp 99.2 F (37.3 C) (Oral)   Resp (!) 25   Ht '5\' 3"'  (1.6 m)   Wt 75 kg   SpO2 95%   BMI 29.29 kg/m  Exam: General: Awake, non-ill appearing, laying in bed, NAD HEENT: Normocephalic, no scleral icterus, moist mucous membranes Cardiovascular: irregular rate and tachycardic, murmurs, normal S1/S2 Respiratory: CTAB, no wheezing or crackles, normal WOB on RA Gastrointestinal: Soft, no distention, no tenderness MSK: Good muscle tone,  Left CVA tenderness, no LE edema  Derm: Dry, warm, well perfused, no visible rash on exposed skin Neuro: Oriented x3, no focal deficit Psych: Pleasant mood and affect  Labs and Imaging: CBC BMET  Recent Labs  Lab 03/31/21 2204  WBC 10.3  HGB 11.2*  HCT 35.3*  PLT 160   Recent Labs  Lab 03/31/21 2204  NA 133*  K 3.8  CL 104  CO2 19*   BUN 31*  CREATININE 1.23*  GLUCOSE 357*  CALCIUM 8.2*     EKG: Irregularly irregular with absent P-waves and HR ~184 demonstrating AFIB RVR   DG Chest Portable 1 View  Result Date: 03/31/2021 CLINICAL DATA:  Fever and chills EXAM: PORTABLE CHEST 1 VIEW COMPARISON:  03/30/2021 FINDINGS: Cardiac shadow is stable. Postsurgical changes are noted projecting over the right lung base. Some scarring is noted in the right base. No bony abnormality is seen. IMPRESSION: Postsurgical changes in the right lower lobe with associated volume loss. These changes are stable from the prior exam. No acute abnormality noted. Electronically Signed   By: Inez Catalina M.D.   On: 03/31/2021 22:28      Alen Bleacher, MD 03/31/2021, 11:24 PM PGY-1, [place] Upper-Level Resident Addendum   I have independently interviewed and examined the patient. I have discussed the above with the original author and agree with their documentation. My edits for correction/addition/clarification are in within the document. Please see also any attending notes.   Lyndee Hensen, DO PGY-3, [facility] 04/01/2021 6:07 AM  FPTS Service pager: [phone] (text pages welcome through [facility])

## 2021-04-01 ENCOUNTER — Inpatient Hospital Stay (HOSPITAL_COMMUNITY): Payer: Self-pay

## 2021-04-01 DIAGNOSIS — E118 Type 2 diabetes mellitus with unspecified complications: Secondary | ICD-10-CM

## 2021-04-01 DIAGNOSIS — I4891 Unspecified atrial fibrillation: Secondary | ICD-10-CM

## 2021-04-01 DIAGNOSIS — R7881 Bacteremia: Secondary | ICD-10-CM | POA: Diagnosis present

## 2021-04-01 DIAGNOSIS — A419 Sepsis, unspecified organism: Secondary | ICD-10-CM

## 2021-04-01 DIAGNOSIS — A414 Sepsis due to anaerobes: Secondary | ICD-10-CM

## 2021-04-01 DIAGNOSIS — R6520 Severe sepsis without septic shock: Secondary | ICD-10-CM

## 2021-04-01 LAB — CBC
HCT: 34.8 % — ABNORMAL LOW (ref 36.0–46.0)
Hemoglobin: 11.4 g/dL — ABNORMAL LOW (ref 12.0–15.0)
MCH: 28.9 pg (ref 26.0–34.0)
MCHC: 32.8 g/dL (ref 30.0–36.0)
MCV: 88.1 fL (ref 80.0–100.0)
Platelets: 171 10*3/uL (ref 150–400)
RBC: 3.95 MIL/uL (ref 3.87–5.11)
RDW: 13.3 % (ref 11.5–15.5)
WBC: 11.1 10*3/uL — ABNORMAL HIGH (ref 4.0–10.5)
nRBC: 0 % (ref 0.0–0.2)

## 2021-04-01 LAB — COMPREHENSIVE METABOLIC PANEL
ALT: 13 U/L (ref 0–44)
AST: 17 U/L (ref 15–41)
Albumin: 2.6 g/dL — ABNORMAL LOW (ref 3.5–5.0)
Alkaline Phosphatase: 134 U/L — ABNORMAL HIGH (ref 38–126)
Anion gap: 9 (ref 5–15)
BUN: 25 mg/dL — ABNORMAL HIGH (ref 8–23)
CO2: 23 mmol/L (ref 22–32)
Calcium: 8.6 mg/dL — ABNORMAL LOW (ref 8.9–10.3)
Chloride: 101 mmol/L (ref 98–111)
Creatinine, Ser: 1.04 mg/dL — ABNORMAL HIGH (ref 0.44–1.00)
GFR, Estimated: >60 mL/min (ref >60)
Glucose, Bld: 206 mg/dL — ABNORMAL HIGH (ref 70–99)
Potassium: 3.9 mmol/L (ref 3.5–5.1)
Sodium: 133 mmol/L — ABNORMAL LOW (ref 135–145)
Total Bilirubin: 0.6 mg/dL (ref 0.3–1.2)
Total Protein: 6.3 g/dL — ABNORMAL LOW (ref 6.5–8.1)

## 2021-04-01 LAB — CBG MONITORING, ED
Glucose-Capillary: 187 mg/dL — ABNORMAL HIGH (ref 70–99)
Glucose-Capillary: 273 mg/dL — ABNORMAL HIGH (ref 70–99)
Glucose-Capillary: 181 mg/dL — ABNORMAL HIGH (ref 70–99)

## 2021-04-01 LAB — URINE CULTURE: Culture: >=100000 — AB

## 2021-04-01 LAB — MAGNESIUM
Magnesium: 1.1 mg/dL — ABNORMAL LOW (ref 1.7–2.4)
Magnesium: 2 mg/dL (ref 1.7–2.4)

## 2021-04-01 LAB — GLUCOSE, CAPILLARY: Glucose-Capillary: 170 mg/dL — ABNORMAL HIGH (ref 70–99)

## 2021-04-01 LAB — RESP PANEL BY RT-PCR (FLU A&B, COVID) ARPGX2
Influenza A by PCR: NEGATIVE
Influenza B by PCR: NEGATIVE
SARS Coronavirus 2 by RT PCR: NEGATIVE

## 2021-04-01 LAB — LACTIC ACID, PLASMA: Lactic Acid, Venous: 2.2 mmol/L (ref 0.5–1.9)

## 2021-04-01 MED ORDER — DILTIAZEM HCL-DEXTROSE 125-5 MG/125ML-% IV SOLN (PREMIX): 5.0000 mg/h | INTRAVENOUS | Status: DC

## 2021-04-01 MED ORDER — MAGNESIUM SULFATE 4 GM/100ML IV SOLN
4.0000 g | Freq: Once | INTRAVENOUS | Status: AC
  Administered 2021-04-01: 4 g via INTRAVENOUS
  Filled 2021-04-01: qty 100

## 2021-04-01 MED ORDER — INSULIN ASPART 100 UNIT/ML IJ SOLN
0.0000 [IU] | Freq: Three times a day (TID) | INTRAMUSCULAR | Status: DC
  Administered 2021-04-01: 3 [IU] via SUBCUTANEOUS
  Administered 2021-04-01: 8 [IU] via SUBCUTANEOUS
  Administered 2021-04-02: 5 [IU] via SUBCUTANEOUS

## 2021-04-01 MED ORDER — SODIUM CHLORIDE 0.9 % IV SOLN
1.0000 g | Freq: Once | INTRAVENOUS | Status: AC
  Administered 2021-04-01: 1 g via INTRAVENOUS
  Filled 2021-04-01: qty 10

## 2021-04-01 MED ORDER — INSULIN GLARGINE-YFGN 100 UNIT/ML ~~LOC~~ SOLN
15.0000 [IU] | Freq: Every day | SUBCUTANEOUS | Status: DC
  Filled 2021-04-01: qty 0.15

## 2021-04-01 MED ORDER — EZETIMIBE 10 MG PO TABS
10.0000 mg | ORAL_TABLET | Freq: Every day | ORAL | Status: DC
  Administered 2021-04-01 – 2021-04-03 (×3): 10 mg via ORAL
  Filled 2021-04-01 (×3): qty 1

## 2021-04-01 MED ORDER — LISINOPRIL 5 MG PO TABS
5.0000 mg | ORAL_TABLET | Freq: Every day | ORAL | Status: DC
  Administered 2021-04-01 – 2021-04-03 (×3): 5 mg via ORAL
  Filled 2021-04-01 (×3): qty 1

## 2021-04-01 MED ORDER — INSULIN GLARGINE-YFGN 100 UNIT/ML ~~LOC~~ SOLN
15.0000 [IU] | Freq: Every day | SUBCUTANEOUS | Status: DC
  Administered 2021-04-01 – 2021-04-02 (×2): 15 [IU] via SUBCUTANEOUS
  Filled 2021-04-01 (×2): qty 0.15

## 2021-04-01 MED ORDER — ACETAMINOPHEN 325 MG PO TABS
650.0000 mg | ORAL_TABLET | Freq: Four times a day (QID) | ORAL | Status: DC | PRN
  Administered 2021-04-01 – 2021-04-02 (×3): 650 mg via ORAL
  Filled 2021-04-01 (×3): qty 2

## 2021-04-01 MED ORDER — APIXABAN 5 MG PO TABS
5.0000 mg | ORAL_TABLET | Freq: Two times a day (BID) | ORAL | Status: DC
  Administered 2021-04-01 – 2021-04-03 (×6): 5 mg via ORAL
  Filled 2021-04-01 (×6): qty 1

## 2021-04-01 MED ORDER — POTASSIUM CHLORIDE 20 MEQ PO PACK
20.0000 meq | PACK | Freq: Once | ORAL | Status: AC
  Administered 2021-04-01: 20 meq via ORAL
  Filled 2021-04-01: qty 1

## 2021-04-01 MED ORDER — SODIUM CHLORIDE 0.9 % IV SOLN
2.0000 g | INTRAVENOUS | Status: DC
  Administered 2021-04-01 – 2021-04-02 (×2): 2 g via INTRAVENOUS
  Filled 2021-04-01 (×3): qty 20

## 2021-04-01 MED ORDER — ATORVASTATIN CALCIUM 40 MG PO TABS
40.0000 mg | ORAL_TABLET | Freq: Every day | ORAL | Status: DC
  Administered 2021-04-01 – 2021-04-03 (×3): 40 mg via ORAL
  Filled 2021-04-01 (×3): qty 1

## 2021-04-01 MED ORDER — METOPROLOL TARTRATE 25 MG PO TABS
25.0000 mg | ORAL_TABLET | Freq: Four times a day (QID) | ORAL | Status: DC
  Administered 2021-04-01 – 2021-04-02 (×5): 25 mg via ORAL
  Filled 2021-04-01 (×5): qty 1

## 2021-04-01 MED ORDER — ACETAMINOPHEN 650 MG RE SUPP: 650.0000 mg | Freq: Four times a day (QID) | RECTAL | Status: DC | PRN

## 2021-04-01 NOTE — ED Notes (Signed)
MD Vicente Serene called regarding last BP and asked to run BP again. BP currently 93/65. Pt still in NAD and asymptomatic.

## 2021-04-01 NOTE — Hospital Course (Addendum)
Emily Farmer is a 65 y.o. female presenting with emesis and rigors. PMH is significant for paroxysmal A fib, HFpEF, T2DM, HLD, and Iron deficiency anemia    Klebsiella bacteremia, severe sepsis On previous visit, patient presented to the ED on 10/15 for palpitations, found to be in A fib with RVR. Her heart rate decreased to the 90's with dilt bolus. UA infected, thought to be the cause of RVR. Sent home with a script for Keflex, which was never picked up. Patient reports she began having her present symptoms and that this prevented her from picking up the script. Blood culture returned positive 24 hrs later with Klebsiella. She presented to the ED on 10/16 with emesis, fever (recorded by EMS), CVA tenderness, UA with nitrites and a few WBCs, concerning for pyelo. Met severe sepsis criteria with tachycardia, tachypnea, leukocytosis, and lactate of 2.2. Ceftriaxone was started with rapid improvement in symptoms. The next morning the patient appeared well and denied anymore systemic symptoms, this continued until discharge. Given her repeated klebsiella UTIs and bacteremias, it was thought a urinary nidus of infection might be present, however, a renal US was negative for anomaly of the kidneys and bladder. It was also felt that her T2DM was likely a strong factor in her presentations. This was discussed with the patient on multiple occasions along with the need for better glycemic control.  On the day before discharge a midline IV was placed and she was educated on administration of her ceftriaxone. This was set for a 10 day course to go from 10/16 to 10/25.   Afib with RVR Patient with paroxysmal AF. Presented with HR 184, successfully rate controlled with a diltiazem drip. She was switched to oral Lopressor 50 mg q8hrs with HR from 90-110. Given, this she was discharged on Toprol 100 mg BID.   Uncontrolled Type II Diabetes  Glucose on admission was 357. Most recent hgb A1c was 12.8 (9/14). She was on home  medication of Insulin Glargine 27unit at bed time, 10U of prandial Humalog, metformin 250 mg Q6H, and Victoza 1.2 mg daily. Clearly the patient needs better glycemic control. She reports not taking her prandial insulin because she is at work during the day. During the admission her blood sugars ranged from 100's to 200's. She was started on 15U of glargine, which was increased to 20U. On the day before D/C, her sliding scale was discontinued in preparation for discharge. Her fasting CBG was 146, indicating that she responds well to insulin when it is taken.  Her discharge diabetic regimen was: glargine 27U, no prandial insulin (due to patient not taking it), metformin at 250 mg q6hrs (given that patient had nausea with consolidation to 1,000 mg daily), Victoza daily (with recommendation to switch to Trulicity for ease of dosing).   HFpEF September echo: EF of 56-25%, diastolic parameters indeterminate, TR mod to severe. G2DD one year ago. No signs of volume overload on exam.    Elevated Cr Likely pre-renal. Bl of 0.8-1.0. peak of 1.23. Discharge Cr of 0.9.   HLD On home medication of Zetia 72m daily and Lipitor 445mdaily. Continued home meds.    Iron deficiency anaemia  Hgb on admission was 11.2 and MCV 89.6. She is on home medication of ferrous sulfate 325 mg twice daily. Last colonoscopy in 2020, which was unremarkable. Held home medication of ferrous sulfate 325 given infection.  Follow-up recommendations: -Obtain test of cure (UA/urine culture) on discharge after abx course -Obtain TTE to rule out vegetations  from bacteremia -Switch from Victoza to Trulicity for ease of dosing -Continue metformin 250 mg q6hrs

## 2021-04-01 NOTE — Progress Notes (Signed)
Family Medicine Teaching Service Daily Progress Note Intern Pager: [phone]  Patient name: Emily Farmer Medical record number: [id number] Date of birth: 1956/04/03 Age: 65 y.o. Gender: female  Primary Care Provider: Ladell Pier, MD Consultants: none Code Status: FULL  Pt Overview and Major Events to Date:  10/16: admitted  Assessment and Plan: Emily Farmer is a 65 y.o. female presenting with palpitations . PMH is significant for paroxysmal A fib, HFpEF, T2DM, HLD, and Iron deficiency anemia   Klebsiella bacteremia, severe sepsis Patient presented to the ED on 10/15 for palpitations, found to be in A fib with RVR. Her heart rate decreased to the 90's with dilt bolus. UA infected, thought to be the cause of RVR. Sent home with a script for Keflex, which was never picked up. Patient reports she began having Blood culture returned positive 24 hrs later with Klebsiella (Sensitivities pending). She presented to the ED yesterday with emesis, fever with EMS, CVA tenderness, UA with nitrites and a few WBCs, concerning for pyelo. Treated with CTX. Met severe sepsis criteria with tachycardia, tachypnea, leukocytosis, and lactate of 2.2. This morning patient appears well, alert and oriented x4 with no more systemic symptoms (rigors, chills, emesis). At this point, her repeated klebsiella UTIs/pyelo raise concern for a nidus of infection in urinary tract, however, did have a CT A/P on 9/13 that did not show any such focus. Non-adherence to her diabetic regimen is likely strong contributing factor. -Continue CTX 1g daily (7 day course starting when Bcx negative)  -F/u Ucx, Bcx, sensitivities -Daily BMP, CBC -Routine vitals -Once bacteremia is cleared, will need outpatient Ucx and UA to demonstrate that infection is gone  Afib with RVR Patient with pAF. Presented with HR 184, now rate controlled with dilt drip. Currently in AF with HR of 90-100.  -f/u EKG -cardiac monitoring -K>4, mg>2 -  replaced this AM -Start Lopressor 25 mg q6hrs -D/C dilt drip 2 hrs later -Hold home Toprol 100 mg daily, will transition  -Continue home Eliquis -f/u with cards OP  HFpEF September echo: EF of 95-63%, diastolic parameters indeterminate, TR mod to severe. G2DD one year ago. No signs of volume overload on exam.   Uncontrolled Type II Diabetes  Glucose on admission was 357. Most recent hgb A1c was 12.8 (9/14). She is on home medication of Insulin Glargine 27unit at bed time, metformin 250 mg Q6H, and Victoza 1.2 mg daily. -QID CBGs -Semeglee 15 units this AM, one time order -Hold MF -moderate sliding scale insulin  -Heart healthy/carb modified diet  Elevated Cr Likely pre-renal. Bl of 0.8-1.0. Currently 1.23.  -Daily BMP  HLD On home medication of Zetia 57m daily and Lipitor 429mdaily -Continue home medication.  Iron deficiency anaemia  Hgb on admission was 11.2 and MCV 89.6. She is on home medication of ferrous sulfate 325 mg twice daily. Will hold iron supplement due to ongoing infectious process. Last colonoscopy in 2020, which was unremarkable.  -Hold home medication of ferrous sulfate 325  FEN/GI: Heart healthy/carb modified diet Prophylaxis: Continue home Eliquis Dispo:TBD, likely home  Subjective:  On interview this morning, patient is alert and oriented to events. She reports good PO intake. She denies current pain and says that her rigors and emesis have resolved.   Objective: Temp:  [99.2 F (37.3 C)] 99.2 F (37.3 C) (10/16 2149) Pulse Rate:  [76-154] 86 (10/17 0445) Resp:  [17-32] 17 (10/17 0445) BP: (92-121)/(52-86) 111/70 (10/17 0445) SpO2:  [91 %-95 %] 95 % (10/17 0445) Weight:  [  165 lb 5.5 oz (75 kg)] 165 lb 5.5 oz (75 kg) (10/16 2149) Physical Exam Vitals reviewed.  Cardiovascular:     Rate and Rhythm: Normal rate. Rhythm irregular.     Comments: Systolic murmur present Abdominal:     General: Bowel sounds are normal.     Palpations: Abdomen is  soft.     Tenderness: There is no abdominal tenderness.  Skin:    General: Skin is warm and dry.  Neurological:     Mental Status: She is alert and oriented to person, place, and time.     Laboratory: Recent Labs  Lab 03/30/21 0535 03/31/21 2204  WBC 13.9* 10.3  HGB 13.1 11.2*  HCT 39.2 35.3*  PLT 218 160   Recent Labs  Lab 03/30/21 0535 03/31/21 2204  NA 130* 133*  K 4.1 3.8  CL 99 104  CO2 19* 19*  BUN 29* 31*  CREATININE 1.28* 1.23*  CALCIUM 9.0 8.2*  PROT 7.1 6.2*  BILITOT 0.5 0.7  ALKPHOS 146* 153*  ALT 14 12  AST 17 16  GLUCOSE 341* 357*     Imaging/Diagnostic Tests: Labs as above, no new imaging.   Corky Sox, MD PGY-1 [facility] Intern pager: [phone], text pages welcome

## 2021-04-01 NOTE — ED Notes (Signed)
[name] MD w/ family medicine residents to notify of a BP of 92/54. Waiting response. Pt in NAD and asymptomatic.

## 2021-04-01 NOTE — Consult Note (Signed)
[facility]    Date of Admission:  03/31/2021   Total days of antibiotics: 1 ceftriaxone               Reason for Consult: Recurrent Klebsiella bacteremia    Referring Provider: Deirdre Priest, FPTS   Assessment: Klebsiella UTI, bacteremia (recurrent) DM2, uncontrolled [A1C 12.8 (02-27-21)] Hypotension, tachycardia, BCx+, tachypnea, increased lactic acid- Sepsis.   Plan: Hydrate as tolerated, watch BP.  Consider u/s of kidneys Await BCx Continue ceftriaxone  Comment- Klebsiella are associated with staghorn calculi which could serve as a nidus for recurrent infection. Alternatively, her poor diabetic control could mean she has colonization and would be at risk for recurrent infections.   Thank you so much for this interesting consult,  Active Problems:   Atrial fibrillation with RVR (HCC)   Bacteremia    apixaban  5 mg Oral BID   atorvastatin  40 mg Oral Daily   ezetimibe  10 mg Oral Daily   insulin aspart  0-15 Units Subcutaneous TID WC   insulin glargine-yfgn  15 Units Subcutaneous Daily   lisinopril  5 mg Oral Daily   metoprolol tartrate  25 mg Oral Q6H    HPI: Emily Farmer is a 65 y.o. female with hx of poorly controlled DM2, afib with RVR, HFPREF; was seen in ED 10-15 with sx of UTI. Her UCx grew Klebsiella (R- amp, I- nitro) (she did not get her keflex).  She returns to ED 10-16 with chills, body aches and L CVA tenderness.  Her Lactic acid was 2.1, WBC 10.3.   Of note, she also had Klebsiella (R- amp, I- nitro) UTI and bacteremia 02-2021. States she was d/c home with anbx which she took.  Her Glc have been irregular- up to 300 recently.    Review of Systems: Review of Systems  Constitutional:  Positive for chills. Negative for fever.  Eyes:        No vision change. Ophtho f/u 06-2021.   Respiratory:  Positive for cough. Negative for sputum production.   Gastrointestinal:  Negative for constipation and diarrhea.   Genitourinary:  Positive for frequency.  Neurological:  Negative for dizziness and sensory change.   Past Medical History:  Diagnosis Date   Arthritis    Atrial fibrillation (HCC)    a. 09/2016 in setting of pancreatitis;  b. 09/2016 Echo: EF 65-60%, no rwma, Gr2 DD, mild MR, triv TR, PASP ;  CHA2DS2VASc = 4-->Eliquis  BID. c. Recurred 06/2018 in setting of GIB.   Chronic diastolic CHF (congestive heart failure) (HCC) 01/28/2018   Diabetes mellitus    GI bleeding 06/2018   Gout    Hyperlipidemia    Hypertension    Hypertriglyceridemia    Pancreatitis    a. 09/2016 - Triglycerides 1,392 on admission.    Social History   Tobacco Use   Smoking status: Never   Smokeless tobacco: Never  Vaping Use   Vaping Use: Never used  Substance Use Topics   Alcohol use: No   Drug use: No    Family History  Problem Relation Age of Onset   Heart attack Mother    Heart attack Father    Diabetes Sister    High blood pressure Neg Hx    High Cholesterol Neg Hx      Medications: Scheduled:  apixaban  5 mg Oral BID   atorvastatin  40 mg Oral Daily   ezetimibe  10 mg Oral Daily  insulin aspart  0-15 Units Subcutaneous TID WC   insulin glargine-yfgn  15 Units Subcutaneous Daily   lisinopril  5 mg Oral Daily   metoprolol tartrate  25 mg Oral Q6H    Abtx:  Anti-infectives (From admission, onward)    Start     Dose/Rate Route Frequency Ordered Stop   04/01/21 2200  cefTRIAXone (ROCEPHIN) 2 g in sodium chloride 0.9 % 100 mL IVPB        2 g 200 mL/hr over 30 Minutes Intravenous Every 24 hours 04/01/21 0116     04/01/21 0145  cefTRIAXone (ROCEPHIN) 1 g in sodium chloride 0.9 % 100 mL IVPB        1 g 200 mL/hr over 30 Minutes Intravenous  Once 04/01/21 0139 04/01/21 0246   03/31/21 2245  cefTRIAXone (ROCEPHIN) 1 g in sodium chloride 0.9 % 100 mL IVPB        1 g 200 mL/hr over 30 Minutes Intravenous  Once 03/31/21 2233 03/31/21 2337         OBJECTIVE: Blood pressure 93/65,  pulse 85, temperature 98.7 F (37.1 C), temperature source Oral, resp. rate (!) 33, height 5\' 3"  (1.6 m), weight 75 kg, SpO2 94 %.  Physical Exam Vitals reviewed.  Constitutional:      Appearance: Normal appearance. She is not ill-appearing, toxic-appearing or diaphoretic.  HENT:     Mouth/Throat:     Mouth: Mucous membranes are moist.     Pharynx: No oropharyngeal exudate.  Eyes:     Extraocular Movements: Extraocular movements intact.     Pupils: Pupils are equal, round, and reactive to light.  Cardiovascular:     Rate and Rhythm: Normal rate and regular rhythm.  Pulmonary:     Effort: Pulmonary effort is normal.     Breath sounds: Normal breath sounds.  Abdominal:     General: Abdomen is flat. Bowel sounds are normal.     Palpations: Abdomen is soft.     Tenderness: There is left CVA tenderness. There is no right CVA tenderness.  Musculoskeletal:        General: No swelling. Normal range of motion.     Cervical back: Normal range of motion and neck supple.     Right lower leg: No edema.     Left lower leg: No edema.  Neurological:     General: No focal deficit present.     Mental Status: She is alert.  Psychiatric:        Mood and Affect: Mood normal.    Lab Results Results for orders placed or performed during the hospital encounter of 03/31/21 (from the past 48 hour(s))  Blood culture (routine x 2)     Status: None (Preliminary result)   Collection Time: 03/31/21  9:50 PM   Specimen: BLOOD RIGHT ARM  Result Value Ref Range   Specimen Description BLOOD RIGHT ARM    Special Requests      BOTTLES DRAWN AEROBIC AND ANAEROBIC Blood Culture results may not be optimal due to an inadequate volume of blood received in culture bottles   Culture      NO GROWTH < 12 HOURS Performed at [facility], [address], [place], [place]    Report Status PENDING   CBC with Differential     Status: Abnormal   Collection Time: 03/31/21 10:04 PM  Result Value Ref  Range   WBC 10.3 4.0 - 10.5 K/uL   RBC 3.94 3.87 - 5.11 MIL/uL  Hemoglobin 11.2 (L) 12.0 - 15.0 g/dL   HCT 16.1 (L) 09.6 - 04.5 %   MCV 89.6 80.0 - 100.0 fL   MCH 28.4 26.0 - 34.0 pg   MCHC 31.7 30.0 - 36.0 g/dL   RDW 40.9 81.1 - 91.4 %   Platelets 160 150 - 400 K/uL   nRBC 0.0 0.0 - 0.2 %   Neutrophils Relative % 88 %   Neutro Abs 8.9 (H) 1.7 - 7.7 K/uL   Lymphocytes Relative 5 %   Lymphs Abs 0.5 (L) 0.7 - 4.0 K/uL   Monocytes Relative 7 %   Monocytes Absolute 0.7 0.1 - 1.0 K/uL   Eosinophils Relative 0 %   Eosinophils Absolute 0.0 0.0 - 0.5 K/uL   Basophils Relative 0 %   Basophils Absolute 0.0 0.0 - 0.1 K/uL   Immature Granulocytes 0 %   Abs Immature Granulocytes 0.04 0.00 - 0.07 K/uL    Comment: Performed at [facility], [address], [place], [place] [address]  Comprehensive metabolic panel     Status: Abnormal   Collection Time: 03/31/21 10:04 PM  Result Value Ref Range   Sodium 133 (L) 135 - 145 mmol/L   Potassium 3.8 3.5 - 5.1 mmol/L   Chloride 104 98 - 111 mmol/L   CO2 19 (L) 22 - 32 mmol/L   Glucose, Bld 357 (H) 70 - 99 mg/dL    Comment: Glucose reference range applies only to samples taken after fasting for at least 8 hours.   BUN 31 (H) 8 - 23 mg/dL   Creatinine, Ser 6.21 (H) 0.44 - 1.00 mg/dL   Calcium 8.2 (L) 8.9 - 10.3 mg/dL   Total Protein 6.2 (L) 6.5 - 8.1 g/dL   Albumin 2.7 (L) 3.5 - 5.0 g/dL   AST 16 15 - 41 U/L   ALT 12 0 - 44 U/L   Alkaline Phosphatase 153 (H) 38 - 126 U/L   Total Bilirubin 0.7 0.3 - 1.2 mg/dL   GFR, Estimated 49 (L) >60 mL/min    Comment: (NOTE) Calculated using the CKD-EPI Creatinine Equation (2021)    Anion gap 10 5 - 15    Comment: Performed at [facility], [address] [place], [place] [address]  Urinalysis, Routine w reflex microscopic     Status: Abnormal   Collection Time: 03/31/21 10:04 PM  Result Value Ref Range   Color, Urine AMBER (A) YELLOW    Comment: BIOCHEMICALS MAY BE AFFECTED BY COLOR    APPearance HAZY (A) CLEAR   Specific Gravity, Urine 1.024 1.005 - 1.030   pH 5.0 5.0 - 8.0   Glucose, UA >=500 (A) NEGATIVE mg/dL   Hgb urine dipstick NEGATIVE NEGATIVE   Bilirubin Urine NEGATIVE NEGATIVE   Ketones, ur NEGATIVE NEGATIVE mg/dL   Protein, ur 784 (A) NEGATIVE mg/dL   Nitrite POSITIVE (A) NEGATIVE   Leukocytes,Ua NEGATIVE NEGATIVE   RBC / HPF 0-5 0 - 5 RBC/hpf   WBC, UA 6-10 0 - 5 WBC/hpf   Bacteria, UA NONE SEEN NONE SEEN   Squamous Epithelial / LPF 6-10 0 - 5    Comment: Performed at [facility] Lab, [address], [place], [place] [address]  Lactic acid, plasma     Status: Abnormal   Collection Time: 03/31/21 10:26 PM  Result Value Ref Range   Lactic Acid, Venous 2.1 (HH) 0.5 - 1.9 mmol/L    Comment: CRITICAL RESULT CALLED TO, READ BACK BY AND VERIFIED WITH:  Oliva Bustard [facility] RN 03/31/21 2338 [place] [address] Performed at [facility], [address], [place], [place] [address]   Resp Panel by RT-PCR (Flu A&B, Covid)     Status: None   Collection Time: 03/31/21 11:05 PM   Specimen: Nasopharyngeal(NP) swabs in vial transport medium  Result Value Ref Range   SARS Coronavirus 2 by RT PCR NEGATIVE NEGATIVE    Comment: (NOTE) SARS-CoV-2 target nucleic acids are NOT DETECTED.  The SARS-CoV-2 RNA is generally detectable in upper respiratory specimens during the acute phase of infection. The lowest concentration of SARS-CoV-2 viral copies this assay can detect is 138 copies/mL. A negative result does not preclude SARS-Cov-2 infection and should not be used as the sole basis for treatment or other patient management decisions. A negative result may occur with  improper specimen collection/handling, submission of specimen other than nasopharyngeal swab, presence of viral mutation(s) within the areas targeted by this assay, and inadequate number of viral copies(<138 copies/mL). A negative result must be combined with clinical observations, patient history, and  epidemiological information. The expected result is Negative.  Fact Sheet for Patients:  BloggerCourse.com  Fact Sheet for Healthcare Providers:  SeriousBroker.it  This test is no t yet approved or cleared by the [place] FDA and  has been authorized for detection and/or diagnosis of SARS-CoV-2 by FDA under an Emergency Use Authorization (EUA). This EUA will remain  in effect (meaning this test can be used) for the duration of the COVID-19 declaration under Section 564(b)(1) of the Act, 21 U.S.C.section 360bbb-3(b)(1), unless the authorization is terminated  or revoked sooner.       Influenza A by PCR NEGATIVE NEGATIVE   Influenza B by PCR NEGATIVE NEGATIVE    Comment: (NOTE) The Xpert Xpress SARS-CoV-2/FLU/RSV plus assay is intended as an aid in the diagnosis of influenza from Nasopharyngeal swab specimens and should not be used as a sole basis for treatment. Nasal washings and aspirates are unacceptable for Xpert Xpress SARS-CoV-2/FLU/RSV testing.  Fact Sheet for Patients: BloggerCourse.com  Fact Sheet for Healthcare Providers: SeriousBroker.it  This test is not yet approved or cleared by the [place] FDA and has been authorized for detection and/or diagnosis of SARS-CoV-2 by FDA under an Emergency Use Authorization (EUA). This EUA will remain in effect (meaning this test can be used) for the duration of the COVID-19 declaration under Section 564(b)(1) of the Act, 21 U.S.C. section 360bbb-3(b)(1), unless the authorization is terminated or revoked.  Performed at [facility], [address] [place], [place] [address]   Lactic acid, plasma     Status: Abnormal   Collection Time: 04/01/21 12:38 AM  Result Value Ref Range   Lactic Acid, Venous 2.2 (HH) 0.5 - 1.9 mmol/L    Comment: CRITICAL VALUE NOTED.  VALUE IS CONSISTENT WITH PREVIOUSLY REPORTED AND  CALLED VALUE. Performed at [facility], [address], [place], [place] [address]   Comprehensive metabolic panel     Status: Abnormal   Collection Time: 04/01/21  6:56 AM  Result Value Ref Range   Sodium 133 (L) 135 - 145 mmol/L   Potassium 3.9 3.5 - 5.1 mmol/L   Chloride 101 98 - 111 mmol/L   CO2 23 22 - 32 mmol/L   Glucose, Bld 206 (H) 70 - 99 mg/dL    Comment: Glucose reference range applies only to samples taken after fasting for at least 8 hours.   BUN 25 (H) 8 - 23 mg/dL   Creatinine,  Ser 1.04 (H) 0.44 - 1.00 mg/dL   Calcium 8.6 (L) 8.9 - 10.3 mg/dL   Total Protein 6.3 (L) 6.5 - 8.1 g/dL   Albumin 2.6 (L) 3.5 - 5.0 g/dL   AST 17 15 - 41 U/L   ALT 13 0 - 44 U/L   Alkaline Phosphatase 134 (H) 38 - 126 U/L   Total Bilirubin 0.6 0.3 - 1.2 mg/dL   GFR, Estimated >61 >44 mL/min    Comment: (NOTE) Calculated using the CKD-EPI Creatinine Equation (2021)    Anion gap 9 5 - 15    Comment: Performed at [facility], [address], [place], [place] [address]  CBC     Status: Abnormal   Collection Time: 04/01/21  6:56 AM  Result Value Ref Range   WBC 11.1 (H) 4.0 - 10.5 K/uL   RBC 3.95 3.87 - 5.11 MIL/uL   Hemoglobin 11.4 (L) 12.0 - 15.0 g/dL   HCT 08.6 (L) 76.1 - 95.0 %   MCV 88.1 80.0 - 100.0 fL   MCH 28.9 26.0 - 34.0 pg   MCHC 32.8 30.0 - 36.0 g/dL   RDW 93.2 67.1 - 24.5 %   Platelets 171 150 - 400 K/uL   nRBC 0.0 0.0 - 0.2 %    Comment: Performed at [facility] Lab, [address], [place], [place] [address]  CBG monitoring, ED     Status: Abnormal   Collection Time: 04/01/21  7:51 AM  Result Value Ref Range   Glucose-Capillary 187 (H) 70 - 99 mg/dL    Comment: Glucose reference range applies only to samples taken after fasting for at least 8 hours.   Comment 1 Notify RN    Comment 2 Document in Chart   Magnesium     Status: Abnormal   Collection Time: 04/01/21  8:33 AM  Result Value Ref Range   Magnesium 1.1 (L) 1.7 - 2.4 mg/dL    Comment:  Performed at [facility], [address] [place], [place] [address]  CBG monitoring, ED     Status: Abnormal   Collection Time: 04/01/21 11:47 AM  Result Value Ref Range   Glucose-Capillary 273 (H) 70 - 99 mg/dL    Comment: Glucose reference range applies only to samples taken after fasting for at least 8 hours.   Comment 1 Notify RN    Comment 2 Document in Chart       Component Value Date/Time   SDES BLOOD RIGHT ARM 03/31/2021 2150   SPECREQUEST  03/31/2021 2150    BOTTLES DRAWN AEROBIC AND ANAEROBIC Blood Culture results may not be optimal due to an inadequate volume of blood received in culture bottles   CULT  03/31/2021 2150    NO GROWTH < 12 HOURS Performed at [facility], [address], [place], [place] [address]    REPTSTATUS PENDING 03/31/2021 2150   DG Chest Portable 1 View  Result Date: 03/31/2021 CLINICAL DATA:  Fever and chills EXAM: PORTABLE CHEST 1 VIEW COMPARISON:  03/30/2021 FINDINGS: Cardiac shadow is stable. Postsurgical changes are noted projecting over the right lung base. Some scarring is noted in the right base. No bony abnormality is seen. IMPRESSION: Postsurgical changes in the right lower lobe with associated volume loss. These changes are stable from the prior exam. No acute abnormality noted. Electronically Signed   By: Alcide Clever M.D.   On: 03/31/2021 22:28   Recent Results (from the past 240 hour(s))  Urine Culture  Status: Abnormal   Collection Time: 03/30/21  5:23 AM   Specimen: In/Out Cath Urine  Result Value Ref Range Status   Specimen Description IN/OUT CATH URINE  Final   Special Requests   Final    NONE Performed at [facility], [address], [place], [place] [address]    Culture >=100,000 COLONIES/mL KLEBSIELLA PNEUMONIAE (A)  Final   Report Status 04/01/2021 FINAL  Final   Organism ID, Bacteria KLEBSIELLA PNEUMONIAE (A)  Final      Susceptibility   Klebsiella pneumoniae - MIC*    AMPICILLIN >=32 RESISTANT  Resistant     CEFAZOLIN <=4 SENSITIVE Sensitive     CEFEPIME <=0.12 SENSITIVE Sensitive     CEFTRIAXONE <=0.25 SENSITIVE Sensitive     CIPROFLOXACIN <=0.25 SENSITIVE Sensitive     GENTAMICIN <=1 SENSITIVE Sensitive     IMIPENEM <=0.25 SENSITIVE Sensitive     NITROFURANTOIN 64 INTERMEDIATE Intermediate     TRIMETH/SULFA <=20 SENSITIVE Sensitive     AMPICILLIN/SULBACTAM 4 SENSITIVE Sensitive     PIP/TAZO <=4 SENSITIVE Sensitive     * >=100,000 COLONIES/mL KLEBSIELLA PNEUMONIAE  Resp Panel by RT-PCR (Flu A&B, Covid) Nasopharyngeal Swab     Status: None   Collection Time: 03/30/21  5:24 AM   Specimen: Nasopharyngeal Swab; Nasopharyngeal(NP) swabs in vial transport medium  Result Value Ref Range Status   SARS Coronavirus 2 by RT PCR NEGATIVE NEGATIVE Final    Comment: (NOTE) SARS-CoV-2 target nucleic acids are NOT DETECTED.  The SARS-CoV-2 RNA is generally detectable in upper respiratory specimens during the acute phase of infection. The lowest concentration of SARS-CoV-2 viral copies this assay can detect is 138 copies/mL. A negative result does not preclude SARS-Cov-2 infection and should not be used as the sole basis for treatment or other patient management decisions. A negative result may occur with  improper specimen collection/handling, submission of specimen other than nasopharyngeal swab, presence of viral mutation(s) within the areas targeted by this assay, and inadequate number of viral copies(<138 copies/mL). A negative result must be combined with clinical observations, patient history, and epidemiological information. The expected result is Negative.  Fact Sheet for Patients:  BloggerCourse.com  Fact Sheet for Healthcare Providers:  SeriousBroker.it  This test is no t yet approved or cleared by the [place] FDA and  has been authorized for detection and/or diagnosis of SARS-CoV-2 by FDA under an Emergency Use  Authorization (EUA). This EUA will remain  in effect (meaning this test can be used) for the duration of the COVID-19 declaration under Section 564(b)(1) of the Act, 21 U.S.C.section 360bbb-3(b)(1), unless the authorization is terminated  or revoked sooner.       Influenza A by PCR NEGATIVE NEGATIVE Final   Influenza B by PCR NEGATIVE NEGATIVE Final    Comment: (NOTE) The Xpert Xpress SARS-CoV-2/FLU/RSV plus assay is intended as an aid in the diagnosis of influenza from Nasopharyngeal swab specimens and should not be used as a sole basis for treatment. Nasal washings and aspirates are unacceptable for Xpert Xpress SARS-CoV-2/FLU/RSV testing.  Fact Sheet for Patients: BloggerCourse.com  Fact Sheet for Healthcare Providers: SeriousBroker.it  This test is not yet approved or cleared by the [place] FDA and has been authorized for detection and/or diagnosis of SARS-CoV-2 by FDA under an Emergency Use Authorization (EUA). This EUA will remain in effect (meaning this test can be used) for the duration of the COVID-19 declaration under Section 564(b)(1) of the Act, 21 U.S.C. section 360bbb-3(b)(1), unless the authorization  is terminated or revoked.  Performed at [facility], [address] [place], [place] [address]   Blood culture (routine single)     Status: Abnormal (Preliminary result)   Collection Time: 03/30/21  5:35 AM   Specimen: BLOOD  Result Value Ref Range Status   Specimen Description BLOOD SITE NOT SPECIFIED  Final   Special Requests   Final    BOTTLES DRAWN AEROBIC AND ANAEROBIC Blood Culture adequate volume   Culture  Setup Time   Final    GRAM NEGATIVE RODS ANAEROBIC BOTTLE ONLY CRITICAL RESULT CALLED TO, READ BACK BY AND VERIFIED WITH: RN V POWELL  AT 0056 03/31/2021 BY L BENFIELD    Culture (A)  Final    KLEBSIELLA PNEUMONIAE SUSCEPTIBILITIES TO FOLLOW Performed at [facility], [address], [place], [place] [address]    Report Status PENDING  Incomplete  Blood Culture ID Panel (Reflexed)     Status: Abnormal   Collection Time: 03/30/21  5:35 AM  Result Value Ref Range Status   Enterococcus faecalis NOT DETECTED NOT DETECTED Final   Enterococcus Faecium NOT DETECTED NOT DETECTED Final   Listeria monocytogenes NOT DETECTED NOT DETECTED Final   Staphylococcus species NOT DETECTED NOT DETECTED Final   Staphylococcus aureus (BCID) NOT DETECTED NOT DETECTED Final   Staphylococcus epidermidis NOT DETECTED NOT DETECTED Final   Staphylococcus lugdunensis NOT DETECTED NOT DETECTED Final   Streptococcus species NOT DETECTED NOT DETECTED Final   Streptococcus agalactiae NOT DETECTED NOT DETECTED Final   Streptococcus pneumoniae NOT DETECTED NOT DETECTED Final   Streptococcus pyogenes NOT DETECTED NOT DETECTED Final   A.calcoaceticus-baumannii NOT DETECTED NOT DETECTED Final   Bacteroides fragilis NOT DETECTED NOT DETECTED Final   Enterobacterales DETECTED (A) NOT DETECTED Final    Comment: Enterobacterales represent a large order of gram negative bacteria, not a single organism. CRITICAL RESULT CALLED TO, READ BACK BY AND VERIFIED WITH: RN V POWELL AT 0056 03/31/2021 BY L BENFIELD    Enterobacter cloacae complex NOT DETECTED NOT DETECTED Final   Escherichia coli NOT DETECTED NOT DETECTED Final   Klebsiella aerogenes NOT DETECTED NOT DETECTED Final   Klebsiella oxytoca NOT DETECTED NOT DETECTED Final   Klebsiella pneumoniae DETECTED (A) NOT DETECTED Final    Comment: CRITICAL RESULT CALLED TO, READ BACK BY AND VERIFIED WITH: RN V POWELL AT 0056 03/31/2021 BY L BENFIELD    Proteus species NOT DETECTED NOT DETECTED Final   Salmonella species NOT DETECTED NOT DETECTED Final   Serratia marcescens NOT DETECTED NOT DETECTED Final   Haemophilus influenzae NOT DETECTED NOT DETECTED Final   Neisseria meningitidis NOT DETECTED NOT DETECTED Final   Pseudomonas aeruginosa NOT DETECTED  NOT DETECTED Final   Stenotrophomonas maltophilia NOT DETECTED NOT DETECTED Final   Candida albicans NOT DETECTED NOT DETECTED Final   Candida auris NOT DETECTED NOT DETECTED Final   Candida glabrata NOT DETECTED NOT DETECTED Final   Candida krusei NOT DETECTED NOT DETECTED Final   Candida parapsilosis NOT DETECTED NOT DETECTED Final   Candida tropicalis NOT DETECTED NOT DETECTED Final   Cryptococcus neoformans/gattii NOT DETECTED NOT DETECTED Final   CTX-M ESBL NOT DETECTED NOT DETECTED Final   Carbapenem resistance IMP NOT DETECTED NOT DETECTED Final   Carbapenem resistance KPC NOT DETECTED NOT DETECTED Final   Carbapenem resistance NDM NOT DETECTED NOT DETECTED Final   Carbapenem resist OXA 48 LIKE NOT DETECTED NOT DETECTED Final   Carbapenem resistance VIM NOT DETECTED NOT DETECTED Final  Comment: Performed at [facility], [address] [place], [place] [address]  Blood culture (routine x 2)     Status: None (Preliminary result)   Collection Time: 03/31/21  9:50 PM   Specimen: BLOOD RIGHT ARM  Result Value Ref Range Status   Specimen Description BLOOD RIGHT ARM  Final   Special Requests   Final    BOTTLES DRAWN AEROBIC AND ANAEROBIC Blood Culture results may not be optimal due to an inadequate volume of blood received in culture bottles   Culture   Final    NO GROWTH < 12 HOURS Performed at [facility], [address], [place], [place] [address]    Report Status PENDING  Incomplete  Resp Panel by RT-PCR (Flu A&B, Covid)     Status: None   Collection Time: 03/31/21 11:05 PM   Specimen: Nasopharyngeal(NP) swabs in vial transport medium  Result Value Ref Range Status   SARS Coronavirus 2 by RT PCR NEGATIVE NEGATIVE Final    Comment: (NOTE) SARS-CoV-2 target nucleic acids are NOT DETECTED.  The SARS-CoV-2 RNA is generally detectable in upper respiratory specimens during the acute phase of infection. The lowest concentration of SARS-CoV-2 viral copies this assay  can detect is 138 copies/mL. A negative result does not preclude SARS-Cov-2 infection and should not be used as the sole basis for treatment or other patient management decisions. A negative result may occur with  improper specimen collection/handling, submission of specimen other than nasopharyngeal swab, presence of viral mutation(s) within the areas targeted by this assay, and inadequate number of viral copies(<138 copies/mL). A negative result must be combined with clinical observations, patient history, and epidemiological information. The expected result is Negative.  Fact Sheet for Patients:  BloggerCourse.com  Fact Sheet for Healthcare Providers:  SeriousBroker.it  This test is no t yet approved or cleared by the [place] FDA and  has been authorized for detection and/or diagnosis of SARS-CoV-2 by FDA under an Emergency Use Authorization (EUA). This EUA will remain  in effect (meaning this test can be used) for the duration of the COVID-19 declaration under Section 564(b)(1) of the Act, 21 U.S.C.section 360bbb-3(b)(1), unless the authorization is terminated  or revoked sooner.       Influenza A by PCR NEGATIVE NEGATIVE Final   Influenza B by PCR NEGATIVE NEGATIVE Final    Comment: (NOTE) The Xpert Xpress SARS-CoV-2/FLU/RSV plus assay is intended as an aid in the diagnosis of influenza from Nasopharyngeal swab specimens and should not be used as a sole basis for treatment. Nasal washings and aspirates are unacceptable for Xpert Xpress SARS-CoV-2/FLU/RSV testing.  Fact Sheet for Patients: BloggerCourse.com  Fact Sheet for Healthcare Providers: SeriousBroker.it  This test is not yet approved or cleared by the [place] FDA and has been authorized for detection and/or diagnosis of SARS-CoV-2 by FDA under an Emergency Use Authorization (EUA). This EUA will  remain in effect (meaning this test can be used) for the duration of the COVID-19 declaration under Section 564(b)(1) of the Act, 21 U.S.C. section 360bbb-3(b)(1), unless the authorization is terminated or revoked.  Performed at [facility], [address], [place], [place] [address]     Microbiology: Recent Results (from the past 240 hour(s))  Urine Culture     Status: Abnormal   Collection Time: 03/30/21  5:23 AM   Specimen: In/Out Cath Urine  Result Value Ref Range Status   Specimen Description IN/OUT CATH URINE  Final   Special Requests  Final    NONE Performed at [facility], [address], [place], [place] [address]    Culture >=100,000 COLONIES/mL KLEBSIELLA PNEUMONIAE (A)  Final   Report Status 04/01/2021 FINAL  Final   Organism ID, Bacteria KLEBSIELLA PNEUMONIAE (A)  Final      Susceptibility   Klebsiella pneumoniae - MIC*    AMPICILLIN >=32 RESISTANT Resistant     CEFAZOLIN <=4 SENSITIVE Sensitive     CEFEPIME <=0.12 SENSITIVE Sensitive     CEFTRIAXONE <=0.25 SENSITIVE Sensitive     CIPROFLOXACIN <=0.25 SENSITIVE Sensitive     GENTAMICIN <=1 SENSITIVE Sensitive     IMIPENEM <=0.25 SENSITIVE Sensitive     NITROFURANTOIN 64 INTERMEDIATE Intermediate     TRIMETH/SULFA <=20 SENSITIVE Sensitive     AMPICILLIN/SULBACTAM 4 SENSITIVE Sensitive     PIP/TAZO <=4 SENSITIVE Sensitive     * >=100,000 COLONIES/mL KLEBSIELLA PNEUMONIAE  Resp Panel by RT-PCR (Flu A&B, Covid) Nasopharyngeal Swab     Status: None   Collection Time: 03/30/21  5:24 AM   Specimen: Nasopharyngeal Swab; Nasopharyngeal(NP) swabs in vial transport medium  Result Value Ref Range Status   SARS Coronavirus 2 by RT PCR NEGATIVE NEGATIVE Final    Comment: (NOTE) SARS-CoV-2 target nucleic acids are NOT DETECTED.  The SARS-CoV-2 RNA is generally detectable in upper respiratory specimens during the acute phase of infection. The lowest concentration of SARS-CoV-2 viral copies this assay can  detect is 138 copies/mL. A negative result does not preclude SARS-Cov-2 infection and should not be used as the sole basis for treatment or other patient management decisions. A negative result may occur with  improper specimen collection/handling, submission of specimen other than nasopharyngeal swab, presence of viral mutation(s) within the areas targeted by this assay, and inadequate number of viral copies(<138 copies/mL). A negative result must be combined with clinical observations, patient history, and epidemiological information. The expected result is Negative.  Fact Sheet for Patients:  BloggerCourse.com  Fact Sheet for Healthcare Providers:  SeriousBroker.it  This test is no t yet approved or cleared by the [place] FDA and  has been authorized for detection and/or diagnosis of SARS-CoV-2 by FDA under an Emergency Use Authorization (EUA). This EUA will remain  in effect (meaning this test can be used) for the duration of the COVID-19 declaration under Section 564(b)(1) of the Act, 21 U.S.C.section 360bbb-3(b)(1), unless the authorization is terminated  or revoked sooner.       Influenza A by PCR NEGATIVE NEGATIVE Final   Influenza B by PCR NEGATIVE NEGATIVE Final    Comment: (NOTE) The Xpert Xpress SARS-CoV-2/FLU/RSV plus assay is intended as an aid in the diagnosis of influenza from Nasopharyngeal swab specimens and should not be used as a sole basis for treatment. Nasal washings and aspirates are unacceptable for Xpert Xpress SARS-CoV-2/FLU/RSV testing.  Fact Sheet for Patients: BloggerCourse.com  Fact Sheet for Healthcare Providers: SeriousBroker.it  This test is not yet approved or cleared by the [place] FDA and has been authorized for detection and/or diagnosis of SARS-CoV-2 by FDA under an Emergency Use Authorization (EUA). This EUA will remain in  effect (meaning this test can be used) for the duration of the COVID-19 declaration under Section 564(b)(1) of the Act, 21 U.S.C. section 360bbb-3(b)(1), unless the authorization is terminated or revoked.  Performed at [facility], [address], [place], [place] [address]   Blood culture (routine single)     Status: Abnormal (Preliminary result)   Collection Time: 03/30/21  5:35 AM   Specimen: BLOOD  Result Value Ref Range Status   Specimen Description BLOOD SITE NOT SPECIFIED  Final   Special Requests   Final    BOTTLES DRAWN AEROBIC AND ANAEROBIC Blood Culture adequate volume   Culture  Setup Time   Final    GRAM NEGATIVE RODS ANAEROBIC BOTTLE ONLY CRITICAL RESULT CALLED TO, READ BACK BY AND VERIFIED WITH: RN V POWELL  AT 0056 03/31/2021 BY L BENFIELD    Culture (A)  Final    KLEBSIELLA PNEUMONIAE SUSCEPTIBILITIES TO FOLLOW Performed at [facility], [address], [place], [place] [address]    Report Status PENDING  Incomplete  Blood Culture ID Panel (Reflexed)     Status: Abnormal   Collection Time: 03/30/21  5:35 AM  Result Value Ref Range Status   Enterococcus faecalis NOT DETECTED NOT DETECTED Final   Enterococcus Faecium NOT DETECTED NOT DETECTED Final   Listeria monocytogenes NOT DETECTED NOT DETECTED Final   Staphylococcus species NOT DETECTED NOT DETECTED Final   Staphylococcus aureus (BCID) NOT DETECTED NOT DETECTED Final   Staphylococcus epidermidis NOT DETECTED NOT DETECTED Final   Staphylococcus lugdunensis NOT DETECTED NOT DETECTED Final   Streptococcus species NOT DETECTED NOT DETECTED Final   Streptococcus agalactiae NOT DETECTED NOT DETECTED Final   Streptococcus pneumoniae NOT DETECTED NOT DETECTED Final   Streptococcus pyogenes NOT DETECTED NOT DETECTED Final   A.calcoaceticus-baumannii NOT DETECTED NOT DETECTED Final   Bacteroides fragilis NOT DETECTED NOT DETECTED Final   Enterobacterales DETECTED (A) NOT DETECTED Final    Comment:  Enterobacterales represent a large order of gram negative bacteria, not a single organism. CRITICAL RESULT CALLED TO, READ BACK BY AND VERIFIED WITH: RN V POWELL AT 0056 03/31/2021 BY L BENFIELD    Enterobacter cloacae complex NOT DETECTED NOT DETECTED Final   Escherichia coli NOT DETECTED NOT DETECTED Final   Klebsiella aerogenes NOT DETECTED NOT DETECTED Final   Klebsiella oxytoca NOT DETECTED NOT DETECTED Final   Klebsiella pneumoniae DETECTED (A) NOT DETECTED Final    Comment: CRITICAL RESULT CALLED TO, READ BACK BY AND VERIFIED WITH: RN V POWELL AT 0056 03/31/2021 BY L BENFIELD    Proteus species NOT DETECTED NOT DETECTED Final   Salmonella species NOT DETECTED NOT DETECTED Final   Serratia marcescens NOT DETECTED NOT DETECTED Final   Haemophilus influenzae NOT DETECTED NOT DETECTED Final   Neisseria meningitidis NOT DETECTED NOT DETECTED Final   Pseudomonas aeruginosa NOT DETECTED NOT DETECTED Final   Stenotrophomonas maltophilia NOT DETECTED NOT DETECTED Final   Candida albicans NOT DETECTED NOT DETECTED Final   Candida auris NOT DETECTED NOT DETECTED Final   Candida glabrata NOT DETECTED NOT DETECTED Final   Candida krusei NOT DETECTED NOT DETECTED Final   Candida parapsilosis NOT DETECTED NOT DETECTED Final   Candida tropicalis NOT DETECTED NOT DETECTED Final   Cryptococcus neoformans/gattii NOT DETECTED NOT DETECTED Final   CTX-M ESBL NOT DETECTED NOT DETECTED Final   Carbapenem resistance IMP NOT DETECTED NOT DETECTED Final   Carbapenem resistance KPC NOT DETECTED NOT DETECTED Final   Carbapenem resistance NDM NOT DETECTED NOT DETECTED Final   Carbapenem resist OXA 48 LIKE NOT DETECTED NOT DETECTED Final   Carbapenem resistance VIM NOT DETECTED NOT DETECTED Final    Comment: Performed at [facility], [address], [place], [place] [address]  Blood culture (routine x 2)     Status: None (Preliminary result)   Collection Time: 03/31/21  9:50 PM  Specimen: BLOOD  RIGHT ARM  Result Value Ref Range Status   Specimen Description BLOOD RIGHT ARM  Final   Special Requests   Final    BOTTLES DRAWN AEROBIC AND ANAEROBIC Blood Culture results may not be optimal due to an inadequate volume of blood received in culture bottles   Culture   Final    NO GROWTH < 12 HOURS Performed at [facility], [address], [place], [place] [address]    Report Status PENDING  Incomplete  Resp Panel by RT-PCR (Flu A&B, Covid)     Status: None   Collection Time: 03/31/21 11:05 PM   Specimen: Nasopharyngeal(NP) swabs in vial transport medium  Result Value Ref Range Status   SARS Coronavirus 2 by RT PCR NEGATIVE NEGATIVE Final    Comment: (NOTE) SARS-CoV-2 target nucleic acids are NOT DETECTED.  The SARS-CoV-2 RNA is generally detectable in upper respiratory specimens during the acute phase of infection. The lowest concentration of SARS-CoV-2 viral copies this assay can detect is 138 copies/mL. A negative result does not preclude SARS-Cov-2 infection and should not be used as the sole basis for treatment or other patient management decisions. A negative result may occur with  improper specimen collection/handling, submission of specimen other than nasopharyngeal swab, presence of viral mutation(s) within the areas targeted by this assay, and inadequate number of viral copies(<138 copies/mL). A negative result must be combined with clinical observations, patient history, and epidemiological information. The expected result is Negative.  Fact Sheet for Patients:  BloggerCourse.com  Fact Sheet for Healthcare Providers:  SeriousBroker.it  This test is no t yet approved or cleared by the [place] FDA and  has been authorized for detection and/or diagnosis of SARS-CoV-2 by FDA under an Emergency Use Authorization (EUA). This EUA will remain  in effect (meaning this test can be used) for the duration of  the COVID-19 declaration under Section 564(b)(1) of the Act, 21 U.S.C.section 360bbb-3(b)(1), unless the authorization is terminated  or revoked sooner.       Influenza A by PCR NEGATIVE NEGATIVE Final   Influenza B by PCR NEGATIVE NEGATIVE Final    Comment: (NOTE) The Xpert Xpress SARS-CoV-2/FLU/RSV plus assay is intended as an aid in the diagnosis of influenza from Nasopharyngeal swab specimens and should not be used as a sole basis for treatment. Nasal washings and aspirates are unacceptable for Xpert Xpress SARS-CoV-2/FLU/RSV testing.  Fact Sheet for Patients: BloggerCourse.com  Fact Sheet for Healthcare Providers: SeriousBroker.it  This test is not yet approved or cleared by the [place] FDA and has been authorized for detection and/or diagnosis of SARS-CoV-2 by FDA under an Emergency Use Authorization (EUA). This EUA will remain in effect (meaning this test can be used) for the duration of the COVID-19 declaration under Section 564(b)(1) of the Act, 21 U.S.C. section 360bbb-3(b)(1), unless the authorization is terminated or revoked.  Performed at [facility], [address], [place], [place] [address]     Radiographs and labs were personally reviewed by me.   Johny Sax, MD [facility] for Infectious Disease [facility] [phone] 04/01/2021, 2:51 PM

## 2021-04-02 DIAGNOSIS — A4159 Other Gram-negative sepsis: Principal | ICD-10-CM

## 2021-04-02 DIAGNOSIS — N390 Urinary tract infection, site not specified: Secondary | ICD-10-CM

## 2021-04-02 DIAGNOSIS — E1165 Type 2 diabetes mellitus with hyperglycemia: Secondary | ICD-10-CM

## 2021-04-02 LAB — CBC
HCT: 34.4 % — ABNORMAL LOW (ref 36.0–46.0)
Hemoglobin: 11.2 g/dL — ABNORMAL LOW (ref 12.0–15.0)
MCH: 28.6 pg (ref 26.0–34.0)
MCHC: 32.6 g/dL (ref 30.0–36.0)
MCV: 88 fL (ref 80.0–100.0)
Platelets: 195 10*3/uL (ref 150–400)
RBC: 3.91 MIL/uL (ref 3.87–5.11)
RDW: 13.2 % (ref 11.5–15.5)
WBC: 11 10*3/uL — ABNORMAL HIGH (ref 4.0–10.5)
nRBC: 0 % (ref 0.0–0.2)

## 2021-04-02 LAB — COMPREHENSIVE METABOLIC PANEL
ALT: 12 U/L (ref 0–44)
AST: 15 U/L (ref 15–41)
Albumin: 2.6 g/dL — ABNORMAL LOW (ref 3.5–5.0)
Alkaline Phosphatase: 175 U/L — ABNORMAL HIGH (ref 38–126)
Anion gap: 11 (ref 5–15)
BUN: 21 mg/dL (ref 8–23)
CO2: 23 mmol/L (ref 22–32)
Calcium: 9 mg/dL (ref 8.9–10.3)
Chloride: 102 mmol/L (ref 98–111)
Creatinine, Ser: 1.09 mg/dL — ABNORMAL HIGH (ref 0.44–1.00)
GFR, Estimated: 57 mL/min — ABNORMAL LOW (ref >60)
Glucose, Bld: 198 mg/dL — ABNORMAL HIGH (ref 70–99)
Potassium: 4.6 mmol/L (ref 3.5–5.1)
Sodium: 136 mmol/L (ref 135–145)
Total Bilirubin: 0.5 mg/dL (ref 0.3–1.2)
Total Protein: 6.4 g/dL — ABNORMAL LOW (ref 6.5–8.1)

## 2021-04-02 LAB — CULTURE, BLOOD (SINGLE): Special Requests: ADEQUATE

## 2021-04-02 LAB — MAGNESIUM: Magnesium: 1.9 mg/dL (ref 1.7–2.4)

## 2021-04-02 LAB — GLUCOSE, CAPILLARY
Glucose-Capillary: 202 mg/dL — ABNORMAL HIGH (ref 70–99)
Glucose-Capillary: 261 mg/dL — ABNORMAL HIGH (ref 70–99)
Glucose-Capillary: 238 mg/dL — ABNORMAL HIGH (ref 70–99)
Glucose-Capillary: 227 mg/dL — ABNORMAL HIGH (ref 70–99)

## 2021-04-02 MED ORDER — INSULIN GLARGINE-YFGN 100 UNIT/ML ~~LOC~~ SOLN
20.0000 [IU] | Freq: Every day | SUBCUTANEOUS | Status: DC
  Administered 2021-04-03: 20 [IU] via SUBCUTANEOUS
  Filled 2021-04-02: qty 0.2

## 2021-04-02 MED ORDER — ONDANSETRON HCL 4 MG PO TABS
4.0000 mg | ORAL_TABLET | Freq: Once | ORAL | Status: AC
  Administered 2021-04-02: 4 mg via ORAL
  Filled 2021-04-02: qty 1

## 2021-04-02 MED ORDER — METFORMIN HCL 500 MG PO TABS: 1000.0000 mg | ORAL_TABLET | Freq: Every day | ORAL | Status: DC

## 2021-04-02 MED ORDER — METFORMIN HCL ER 500 MG PO TB24
1000.0000 mg | ORAL_TABLET | Freq: Every day | ORAL | Status: DC
  Administered 2021-04-02 – 2021-04-03 (×2): 1000 mg via ORAL
  Filled 2021-04-02 (×3): qty 2

## 2021-04-02 MED ORDER — INSULIN GLARGINE-YFGN 100 UNIT/ML ~~LOC~~ SOLN
5.0000 [IU] | Freq: Once | SUBCUTANEOUS | Status: AC
  Administered 2021-04-02: 5 [IU] via SUBCUTANEOUS
  Filled 2021-04-02: qty 0.05

## 2021-04-02 MED ORDER — MAGNESIUM SULFATE IN D5W 1-5 GM/100ML-% IV SOLN
1.0000 g | Freq: Once | INTRAVENOUS | Status: AC
  Administered 2021-04-02: 1 g via INTRAVENOUS
  Filled 2021-04-02: qty 100

## 2021-04-02 MED ORDER — SODIUM CHLORIDE 0.9% FLUSH: 10.0000 mL | INTRAVENOUS | Status: DC | PRN

## 2021-04-02 MED ORDER — SODIUM CHLORIDE 0.9% FLUSH
10.0000 mL | Freq: Two times a day (BID) | INTRAVENOUS | Status: DC
  Administered 2021-04-02 – 2021-04-03 (×2): 10 mL

## 2021-04-02 MED ORDER — MAGNESIUM SULFATE 50 % IJ SOLN: 1.0000 g | Freq: Once | INTRAMUSCULAR | Status: DC

## 2021-04-02 MED ORDER — METOPROLOL TARTRATE 50 MG PO TABS
50.0000 mg | ORAL_TABLET | Freq: Three times a day (TID) | ORAL | Status: DC
  Administered 2021-04-02 – 2021-04-03 (×3): 50 mg via ORAL
  Filled 2021-04-02 (×3): qty 1

## 2021-04-02 NOTE — Progress Notes (Signed)
Family Medicine Teaching Service Daily Progress Note Intern Pager: [phone]  Patient name: Emily Farmer Medical record number: [id number] Date of birth: Oct 29, 1955 Age: 65 y.o. Gender: female  Primary Care Provider: Marcine Matar, MD Consultants: none Code Status: FULL   Pt Overview and Major Events to Date:  10/16: admitted   Assessment and Plan: Emily Farmer is a 65 y.o. female presenting with palpitations . PMH is significant for paroxysmal A fib, HFpEF, T2DM, HLD, and Iron deficiency anemia   Klebsiella bacteremia, likely 2/2 pyelonephritis Patient with recurrent klebsiella UTI, exhibits continued improvement: no recurrence of emesis, chills/fever. Afebrile, however slight leukocytosis at 11. Able to ambulate well unassisted. ID recommended Renal US to assess for staghorn calculus. This study was unremarkable, no clear nidus for infection found. Also recommending, TTE. Urine culture (clean catch) growing multiple species. Bcx from admission (10/16) NGTD at  2 days. Sensitivities have not resulted for previous culture while patient was in the ED.  -Plan for mid-line today and discharge with a total of 10d of IV CTX (10/16-10/25), necessary given repeat bacteremia -TTE, to assess for vegetations -F/u Ucx, Bcx, sensitivities -Daily BMP, CBC -Routine vitals -Once bacteremia is cleared, will need outpatient Ucx and UA to demonstrate that infection is gone. F/u with ID outpatient.   Afib with RVR Hx of pAF. Over the past 24 hrs, HR has increased to 100-120 in AF off dilt drip. SBP from 112-154, MAPs>80.  -Increase Lopressor to 50 mg q8hrs (increase total daily dose from 100 mg to 150 mg daily) -cardiac monitoring -K>4, mg>2 -Hold home Toprol 100 mg daily -Continue home Eliquis -f/u with cards OP  HFpEF September echo: EF of 55-60%, diastolic parameters indeterminate, TR mod to severe. G2DD one year ago. No signs of volume overload on exam.    Uncontrolled Type II Diabetes   Most recent hgb A1c was 12.8 (9/14). She is on home medication of Insulin Glargine 27unit at bed time, prandial 10U, metformin 250 mg Q6H, and Victoza 1.2 mg daily. Fasting CBG of 202 this AM.  -QID CBGs -Increase Semglee to 20 units daily -Hold MF and Victoza -D/C moderate sliding scale insulin given patient not taking prandial insulin at home (preparation for D/C tomorrow) -Heart healthy/carb modified diet    Elevated Cr Likely pre-renal. Bl of 0.8-1.0. Had been 1.23. Currently 1.1.  -Daily BMP   HLD On home medication of Zetia 10mg  daily and Lipitor 40mg  daily -Continue home medication.   Iron deficiency anaemia  Hgb on admission was 11.2 and MCV 89.6. She is on home medication of ferrous sulfate 325 mg twice daily. Will hold iron supplement due to ongoing infectious process. Last colonoscopy in 2020, which was unremarkable.  -Hold home medication of ferrous sulfate 325   FEN/GI: Heart healthy/carb modified diet Prophylaxis: Continue home Eliquis Dispo: home, likely D/C tomorrow   Subjective:  Patient reports continued improvement in symptoms, no fever/chills, N/V. She is able to ambulate to the door and back unassisted. ROS negative for CP, SoB.   Objective: Temp:  [97.9 F (36.6 C)-99.1 F (37.3 C)] 97.9 F (36.6 C) (10/18 0325) Pulse Rate:  [42-115] 102 (10/18 0325) Resp:  [15-33] 18 (10/18 0325) BP: (92-154)/(54-97) 134/81 (10/18 0325) SpO2:  [91 %-97 %] 93 % (10/18 0325) Weight:  [169 lb 8 oz (76.9 kg)-169 lb 11.2 oz (77 kg)] 169 lb 8 oz (76.9 kg) (10/18 0325) Physical Exam Vitals reviewed.  Cardiovascular:     Rate and Rhythm: Normal rate and regular rhythm.  Pulmonary:     Effort: Pulmonary effort is normal.  Abdominal:     General: Bowel sounds are normal.     Palpations: Abdomen is soft.  Skin:    General: Skin is warm and dry.  Neurological:     Mental Status: She is alert and oriented to person, place, and time.     Laboratory: Recent Labs  Lab  03/31/21 2204 04/01/21 0656 04/02/21 0319  WBC 10.3 11.1* 11.0*  HGB 11.2* 11.4* 11.2*  HCT 35.3* 34.8* 34.4*  PLT 160 171 195   Recent Labs  Lab 03/31/21 2204 04/01/21 0656 04/02/21 0319  NA 133* 133* 136  K 3.8 3.9 4.6  CL 104 101 102  CO2 19* 23 23  BUN 31* 25* 21  CREATININE 1.23* 1.04* 1.09*  CALCIUM 8.2* 8.6* 9.0  PROT 6.2* 6.3* 6.4*  BILITOT 0.7 0.6 0.5  ALKPHOS 153* 134* 175*  ALT 12 13 12   AST 16 17 15   GLUCOSE 357* 206* 198*     Imaging/Diagnostic Tests: Cultures as above   , MD PGY-1 FPTS Intern pager: [phone], text pages welcome

## 2021-04-02 NOTE — Progress Notes (Signed)
   04/01/21 2332  Assess: MEWS Score  Temp 98.4 F (36.9 C)  BP 135/79  Pulse Rate (!) 115  Resp 20  SpO2 91 %  O2 Device Room Air  Assess: MEWS Score  MEWS Temp 0  MEWS Systolic 0  MEWS Pulse 2  MEWS RR 0  MEWS LOC 0  MEWS Score 2  MEWS Score Color Yellow  Assess: if the MEWS score is Yellow or Red  Were vital signs taken at a resting state? Yes  Focused Assessment No change from prior assessment  Early Detection of Sepsis Score *See Row Information* Medium  MEWS guidelines implemented *See Row Information* No, other (Comment)  Document  Patient Outcome Not stable and remains on department  Progress note created (see row info) Yes  Patient admitted for and remains in afib with RVR. This does not constitute a change in condition. Remains Q4.

## 2021-04-02 NOTE — Progress Notes (Signed)
INFECTIOUS DISEASE PROGRESS NOTE  ID: Emily Farmer is a 65 y.o. female with  Active Problems:   Atrial fibrillation with RVR (HCC)   Bacteremia  Subjective: No dysuria.  Still some flank pain.   Abtx:  Anti-infectives (From admission, onward)    Start     Dose/Rate Route Frequency Ordered Stop   04/01/21 2200  cefTRIAXone (ROCEPHIN) 2 g in sodium chloride 0.9 % 100 mL IVPB        2 g 200 mL/hr over 30 Minutes Intravenous Every 24 hours 04/01/21 0116     04/01/21 0145  cefTRIAXone (ROCEPHIN) 1 g in sodium chloride 0.9 % 100 mL IVPB        1 g 200 mL/hr over 30 Minutes Intravenous  Once 04/01/21 0139 04/01/21 0246   03/31/21 2245  cefTRIAXone (ROCEPHIN) 1 g in sodium chloride 0.9 % 100 mL IVPB        1 g 200 mL/hr over 30 Minutes Intravenous  Once 03/31/21 2233 03/31/21 2337       Medications: Scheduled:  apixaban  5 mg Oral BID   atorvastatin  40 mg Oral Daily   ezetimibe  10 mg Oral Daily   insulin aspart  0-15 Units Subcutaneous TID WC   insulin glargine-yfgn  15 Units Subcutaneous Daily   lisinopril  5 mg Oral Daily   metoprolol tartrate  25 mg Oral Q6H    Objective: Vital signs in last 24 hours: Temp:  [97.9 F (36.6 C)-99.1 F (37.3 C)] 98 F (36.7 C) (10/18 0714) Pulse Rate:  [42-115] 103 (10/18 0714) Resp:  [15-33] 18 (10/18 0714) BP: (92-154)/(54-97) 112/76 (10/18 0714) SpO2:  [91 %-97 %] 95 % (10/18 0714) Weight:  [76.9 kg-77 kg] 76.9 kg (10/18 0325)   General appearance: alert, cooperative, and no distress Back: L CVA tenderness (sl better) Resp: clear to auscultation bilaterally Cardio: regular rate and rhythm GI: normal findings: bowel sounds normal and soft, non-tender  Lab Results Recent Labs    04/01/21 0656 04/02/21 0319  WBC 11.1* 11.0*  HGB 11.4* 11.2*  HCT 34.8* 34.4*  NA 133* 136  K 3.9 4.6  CL 101 102  CO2 23 23  BUN 25* 21  CREATININE 1.04* 1.09*   Liver Panel Recent Labs    04/01/21 0656 04/02/21 0319  PROT 6.3*  6.4*  ALBUMIN 2.6* 2.6*  AST 17 15  ALT 13 12  ALKPHOS 134* 175*  BILITOT 0.6 0.5   Sedimentation Rate No results for input(s): ESRSEDRATE in the last 72 hours. C-Reactive Protein No results for input(s): CRP in the last 72 hours.  Microbiology: Recent Results (from the past 240 hour(s))  Urine Culture     Status: Abnormal   Collection Time: 03/30/21  5:23 AM   Specimen: In/Out Cath Urine  Result Value Ref Range Status   Specimen Description IN/OUT CATH URINE  Final   Special Requests   Final    NONE Performed at [facility], [address], [place], [place] [address]    Culture >=100,000 COLONIES/mL KLEBSIELLA PNEUMONIAE (A)  Final   Report Status 04/01/2021 FINAL  Final   Organism ID, Bacteria KLEBSIELLA PNEUMONIAE (A)  Final      Susceptibility   Klebsiella pneumoniae - MIC*    AMPICILLIN >=32 RESISTANT Resistant     CEFAZOLIN <=4 SENSITIVE Sensitive     CEFEPIME <=0.12 SENSITIVE Sensitive     CEFTRIAXONE <=0.25 SENSITIVE Sensitive     CIPROFLOXACIN <=0.25 SENSITIVE Sensitive  GENTAMICIN <=1 SENSITIVE Sensitive     IMIPENEM <=0.25 SENSITIVE Sensitive     NITROFURANTOIN 64 INTERMEDIATE Intermediate     TRIMETH/SULFA <=20 SENSITIVE Sensitive     AMPICILLIN/SULBACTAM 4 SENSITIVE Sensitive     PIP/TAZO <=4 SENSITIVE Sensitive     * >=100,000 COLONIES/mL KLEBSIELLA PNEUMONIAE  Resp Panel by RT-PCR (Flu A&B, Covid) Nasopharyngeal Swab     Status: None   Collection Time: 03/30/21  5:24 AM   Specimen: Nasopharyngeal Swab; Nasopharyngeal(NP) swabs in vial transport medium  Result Value Ref Range Status   SARS Coronavirus 2 by RT PCR NEGATIVE NEGATIVE Final    Comment: (NOTE) SARS-CoV-2 target nucleic acids are NOT DETECTED.  The SARS-CoV-2 RNA is generally detectable in upper respiratory specimens during the acute phase of infection. The lowest concentration of SARS-CoV-2 viral copies this assay can detect is 138 copies/mL. A negative result does not preclude  SARS-Cov-2 infection and should not be used as the sole basis for treatment or other patient management decisions. A negative result may occur with  improper specimen collection/handling, submission of specimen other than nasopharyngeal swab, presence of viral mutation(s) within the areas targeted by this assay, and inadequate number of viral copies(<138 copies/mL). A negative result must be combined with clinical observations, patient history, and epidemiological information. The expected result is Negative.  Fact Sheet for Patients:  BloggerCourse.com  Fact Sheet for Healthcare Providers:  SeriousBroker.it  This test is no t yet approved or cleared by the [place] FDA and  has been authorized for detection and/or diagnosis of SARS-CoV-2 by FDA under an Emergency Use Authorization (EUA). This EUA will remain  in effect (meaning this test can be used) for the duration of the COVID-19 declaration under Section 564(b)(1) of the Act, 21 U.S.C.section 360bbb-3(b)(1), unless the authorization is terminated  or revoked sooner.       Influenza A by PCR NEGATIVE NEGATIVE Final   Influenza B by PCR NEGATIVE NEGATIVE Final    Comment: (NOTE) The Xpert Xpress SARS-CoV-2/FLU/RSV plus assay is intended as an aid in the diagnosis of influenza from Nasopharyngeal swab specimens and should not be used as a sole basis for treatment. Nasal washings and aspirates are unacceptable for Xpert Xpress SARS-CoV-2/FLU/RSV testing.  Fact Sheet for Patients: BloggerCourse.com  Fact Sheet for Healthcare Providers: SeriousBroker.it  This test is not yet approved or cleared by the [place] FDA and has been authorized for detection and/or diagnosis of SARS-CoV-2 by FDA under an Emergency Use Authorization (EUA). This EUA will remain in effect (meaning this test can be used) for the duration of  the COVID-19 declaration under Section 564(b)(1) of the Act, 21 U.S.C. section 360bbb-3(b)(1), unless the authorization is terminated or revoked.  Performed at [facility], [address] [place], [place] [address]   Blood culture (routine single)     Status: Abnormal (Preliminary result)   Collection Time: 03/30/21  5:35 AM   Specimen: BLOOD  Result Value Ref Range Status   Specimen Description BLOOD SITE NOT SPECIFIED  Final   Special Requests   Final    BOTTLES DRAWN AEROBIC AND ANAEROBIC Blood Culture adequate volume   Culture  Setup Time   Final    GRAM NEGATIVE RODS ANAEROBIC BOTTLE ONLY CRITICAL RESULT CALLED TO, READ BACK BY AND VERIFIED WITH: RN V POWELL  AT 0056 03/31/2021 BY L BENFIELD    Culture (A)  Final    KLEBSIELLA PNEUMONIAE SUSCEPTIBILITIES TO FOLLOW Performed at [facility], [address] [place], [place] [address]    Report Status PENDING  Incomplete  Blood Culture ID Panel (Reflexed)     Status: Abnormal   Collection Time: 03/30/21  5:35 AM  Result Value Ref Range Status   Enterococcus faecalis NOT DETECTED NOT DETECTED Final   Enterococcus Faecium NOT DETECTED NOT DETECTED Final   Listeria monocytogenes NOT DETECTED NOT DETECTED Final   Staphylococcus species NOT DETECTED NOT DETECTED Final   Staphylococcus aureus (BCID) NOT DETECTED NOT DETECTED Final   Staphylococcus epidermidis NOT DETECTED NOT DETECTED Final   Staphylococcus lugdunensis NOT DETECTED NOT DETECTED Final   Streptococcus species NOT DETECTED NOT DETECTED Final   Streptococcus agalactiae NOT DETECTED NOT DETECTED Final   Streptococcus pneumoniae NOT DETECTED NOT DETECTED Final   Streptococcus pyogenes NOT DETECTED NOT DETECTED Final   A.calcoaceticus-baumannii NOT DETECTED NOT DETECTED Final   Bacteroides fragilis NOT DETECTED NOT DETECTED Final   Enterobacterales DETECTED (A) NOT DETECTED Final    Comment: Enterobacterales represent a large order of gram negative  bacteria, not a single organism. CRITICAL RESULT CALLED TO, READ BACK BY AND VERIFIED WITH: RN V POWELL AT 0056 03/31/2021 BY L BENFIELD    Enterobacter cloacae complex NOT DETECTED NOT DETECTED Final   Escherichia coli NOT DETECTED NOT DETECTED Final   Klebsiella aerogenes NOT DETECTED NOT DETECTED Final   Klebsiella oxytoca NOT DETECTED NOT DETECTED Final   Klebsiella pneumoniae DETECTED (A) NOT DETECTED Final    Comment: CRITICAL RESULT CALLED TO, READ BACK BY AND VERIFIED WITH: RN V POWELL AT 0056 03/31/2021 BY L BENFIELD    Proteus species NOT DETECTED NOT DETECTED Final   Salmonella species NOT DETECTED NOT DETECTED Final   Serratia marcescens NOT DETECTED NOT DETECTED Final   Haemophilus influenzae NOT DETECTED NOT DETECTED Final   Neisseria meningitidis NOT DETECTED NOT DETECTED Final   Pseudomonas aeruginosa NOT DETECTED NOT DETECTED Final   Stenotrophomonas maltophilia NOT DETECTED NOT DETECTED Final   Candida albicans NOT DETECTED NOT DETECTED Final   Candida auris NOT DETECTED NOT DETECTED Final   Candida glabrata NOT DETECTED NOT DETECTED Final   Candida krusei NOT DETECTED NOT DETECTED Final   Candida parapsilosis NOT DETECTED NOT DETECTED Final   Candida tropicalis NOT DETECTED NOT DETECTED Final   Cryptococcus neoformans/gattii NOT DETECTED NOT DETECTED Final   CTX-M ESBL NOT DETECTED NOT DETECTED Final   Carbapenem resistance IMP NOT DETECTED NOT DETECTED Final   Carbapenem resistance KPC NOT DETECTED NOT DETECTED Final   Carbapenem resistance NDM NOT DETECTED NOT DETECTED Final   Carbapenem resist OXA 48 LIKE NOT DETECTED NOT DETECTED Final   Carbapenem resistance VIM NOT DETECTED NOT DETECTED Final    Comment: Performed at [facility], [address] [place], [place] [address]  Blood culture (routine x 2)     Status: None (Preliminary result)   Collection Time: 03/31/21  9:50 PM   Specimen: BLOOD RIGHT ARM  Result Value Ref Range Status   Specimen  Description BLOOD RIGHT ARM  Final   Special Requests   Final    BOTTLES DRAWN AEROBIC AND ANAEROBIC Blood Culture results may not be optimal due to an inadequate volume of blood received in culture bottles   Culture   Final    NO GROWTH < 12 HOURS Performed at [facility], [address], [place], [place] [address]    Report Status PENDING  Incomplete  Urine Culture     Status: Abnormal  Collection Time: 03/31/21 10:04 PM   Specimen: Urine, Clean Catch  Result Value Ref Range Status   Specimen Description URINE, CLEAN CATCH  Final   Special Requests   Final    NONE Performed at [facility], [address], [place], [place] [address]    Culture MULTIPLE SPECIES PRESENT, SUGGEST RECOLLECTION (A)  Final   Report Status 04/01/2021 FINAL  Final  Resp Panel by RT-PCR (Flu A&B, Covid)     Status: None   Collection Time: 03/31/21 11:05 PM   Specimen: Nasopharyngeal(NP) swabs in vial transport medium  Result Value Ref Range Status   SARS Coronavirus 2 by RT PCR NEGATIVE NEGATIVE Final    Comment: (NOTE) SARS-CoV-2 target nucleic acids are NOT DETECTED.  The SARS-CoV-2 RNA is generally detectable in upper respiratory specimens during the acute phase of infection. The lowest concentration of SARS-CoV-2 viral copies this assay can detect is 138 copies/mL. A negative result does not preclude SARS-Cov-2 infection and should not be used as the sole basis for treatment or other patient management decisions. A negative result may occur with  improper specimen collection/handling, submission of specimen other than nasopharyngeal swab, presence of viral mutation(s) within the areas targeted by this assay, and inadequate number of viral copies(<138 copies/mL). A negative result must be combined with clinical observations, patient history, and epidemiological information. The expected result is Negative.  Fact Sheet for Patients:  BloggerCourse.com  Fact  Sheet for Healthcare Providers:  SeriousBroker.it  This test is no t yet approved or cleared by the [place] FDA and  has been authorized for detection and/or diagnosis of SARS-CoV-2 by FDA under an Emergency Use Authorization (EUA). This EUA will remain  in effect (meaning this test can be used) for the duration of the COVID-19 declaration under Section 564(b)(1) of the Act, 21 U.S.C.section 360bbb-3(b)(1), unless the authorization is terminated  or revoked sooner.       Influenza A by PCR NEGATIVE NEGATIVE Final   Influenza B by PCR NEGATIVE NEGATIVE Final    Comment: (NOTE) The Xpert Xpress SARS-CoV-2/FLU/RSV plus assay is intended as an aid in the diagnosis of influenza from Nasopharyngeal swab specimens and should not be used as a sole basis for treatment. Nasal washings and aspirates are unacceptable for Xpert Xpress SARS-CoV-2/FLU/RSV testing.  Fact Sheet for Patients: BloggerCourse.com  Fact Sheet for Healthcare Providers: SeriousBroker.it  This test is not yet approved or cleared by the [place] FDA and has been authorized for detection and/or diagnosis of SARS-CoV-2 by FDA under an Emergency Use Authorization (EUA). This EUA will remain in effect (meaning this test can be used) for the duration of the COVID-19 declaration under Section 564(b)(1) of the Act, 21 U.S.C. section 360bbb-3(b)(1), unless the authorization is terminated or revoked.  Performed at [facility], [address] [place], [place] [address]     Studies/Results: US RENAL  Result Date: 04/01/2021 CLINICAL DATA:  Recurrent UTI EXAM: RENAL / URINARY TRACT ULTRASOUND COMPLETE COMPARISON:  CT 02/26/2021 no FINDINGS: Right Kidney: Renal measurements: 10.1 x 5.6 x 5.7 cm = volume: 168.3 mL. Echogenicity within normal limits. No mass or hydronephrosis visualized. Left Kidney: Renal measurements: 11.9 x 5.9 x 4.5  cm = volume: 166 mL. Echogenicity within normal limits. No mass or hydronephrosis visualized. Bladder: Appears normal for degree of bladder distention. Other: None. IMPRESSION: Negative examination Electronically Signed   By: Jasmine Pang M.D.   On: 04/01/2021 22:54   DG Chest Portable 1 View  Result Date: 03/31/2021 CLINICAL DATA:  Fever and chills EXAM: PORTABLE CHEST 1 VIEW COMPARISON:  03/30/2021 FINDINGS: Cardiac shadow is stable. Postsurgical changes are noted projecting over the right lung base. Some scarring is noted in the right base. No bony abnormality is seen. IMPRESSION: Postsurgical changes in the right lower lobe with associated volume loss. These changes are stable from the prior exam. No acute abnormality noted. Electronically Signed   By: Alcide Clever M.D.   On: 03/31/2021 22:28     Assessment/Plan: Klebsiella UTI, bacteremia (recurrent) DM2, uncontrolled [A1C 12.8 (02-27-21)] Hypotension, tachycardia, BCx+, tachypnea, increased lactic acid- Sepsis.   Total days of antibiotics: 2 ceftriaxone  Her renal u/s was (-) For completeness would consider TTE.  Would consider her to go home with mid-line and complete 10 days of ceftriaxone Can f/u in ID or FPTS clinic.          Johny Sax MD, FACP Infectious Diseases (pager) [phone] [email or web address] 04/02/2021, 8:28 AM  LOS: 2 days

## 2021-04-02 NOTE — Progress Notes (Signed)
FPTS Brief Progress Note  S:Pt sleeping   O: BP 135/79 (BP Location: Right Arm)   Pulse (!) 115   Temp 98.4 F (36.9 C) (Oral)   Resp 20   Ht 5\' 3"  (1.6 m)   Wt 75 kg   SpO2 91%   BMI 29.29 kg/m     A/P: Tachycardia not new.  - Orders reviewed. Labs for AM ordered, which was adjusted as needed.    , DO 04/02/2021, 2:53 AM PGY-3, [facility] Night Resident  Please page [phone] with questions.

## 2021-04-02 NOTE — Plan of Care (Signed)
  Problem: Clinical Measurements: Goal: Ability to maintain clinical measurements within normal limits will improve Outcome: Progressing   Problem: Clinical Measurements: Goal: Will remain free from infection Outcome: Progressing   Problem: Clinical Measurements: Goal: Diagnostic test results will improve Outcome: Progressing   Problem: Clinical Measurements: Goal: Respiratory complications will improve Outcome: Progressing   

## 2021-04-03 ENCOUNTER — Inpatient Hospital Stay (HOSPITAL_COMMUNITY): Payer: Self-pay

## 2021-04-03 DIAGNOSIS — Z794 Long term (current) use of insulin: Secondary | ICD-10-CM

## 2021-04-03 DIAGNOSIS — R7881 Bacteremia: Secondary | ICD-10-CM

## 2021-04-03 DIAGNOSIS — N10 Acute tubulo-interstitial nephritis: Secondary | ICD-10-CM

## 2021-04-03 LAB — CBC WITH DIFFERENTIAL/PLATELET
Abs Immature Granulocytes: 0.03 10*3/uL (ref 0.00–0.07)
Basophils Absolute: 0 10*3/uL (ref 0.0–0.1)
Basophils Relative: 1 %
Eosinophils Absolute: 0.3 10*3/uL (ref 0.0–0.5)
Eosinophils Relative: 4 %
HCT: 31.8 % — ABNORMAL LOW (ref 36.0–46.0)
Hemoglobin: 10.5 g/dL — ABNORMAL LOW (ref 12.0–15.0)
Immature Granulocytes: 0 %
Lymphocytes Relative: 21 %
Lymphs Abs: 1.5 10*3/uL (ref 0.7–4.0)
MCH: 28.5 pg (ref 26.0–34.0)
MCHC: 33 g/dL (ref 30.0–36.0)
MCV: 86.2 fL (ref 80.0–100.0)
Monocytes Absolute: 0.8 10*3/uL (ref 0.1–1.0)
Monocytes Relative: 11 %
Neutro Abs: 4.6 10*3/uL (ref 1.7–7.7)
Neutrophils Relative %: 63 %
Platelets: 215 10*3/uL (ref 150–400)
RBC: 3.69 MIL/uL — ABNORMAL LOW (ref 3.87–5.11)
RDW: 13.1 % (ref 11.5–15.5)
WBC: 7.3 10*3/uL (ref 4.0–10.5)
nRBC: 0 % (ref 0.0–0.2)

## 2021-04-03 LAB — BASIC METABOLIC PANEL
Anion gap: 10 (ref 5–15)
BUN: 20 mg/dL (ref 8–23)
CO2: 25 mmol/L (ref 22–32)
Calcium: 8.7 mg/dL — ABNORMAL LOW (ref 8.9–10.3)
Chloride: 98 mmol/L (ref 98–111)
Creatinine, Ser: 0.94 mg/dL (ref 0.44–1.00)
GFR, Estimated: >60 mL/min (ref >60)
Glucose, Bld: 162 mg/dL — ABNORMAL HIGH (ref 70–99)
Potassium: 4 mmol/L (ref 3.5–5.1)
Sodium: 133 mmol/L — ABNORMAL LOW (ref 135–145)

## 2021-04-03 LAB — ECHOCARDIOGRAM LIMITED
AR max vel: 1.53 cm2
AV Area VTI: 1.8 cm2
AV Area mean vel: 1.39 cm2
AV Mean grad: 2 mmHg
AV Peak grad: 3.4 mmHg
Ao pk vel: 0.92 m/s
S' Lateral: 4 cm

## 2021-04-03 LAB — GLUCOSE, CAPILLARY
Glucose-Capillary: 146 mg/dL — ABNORMAL HIGH (ref 70–99)
Glucose-Capillary: 348 mg/dL — ABNORMAL HIGH (ref 70–99)
Glucose-Capillary: 273 mg/dL — ABNORMAL HIGH (ref 70–99)

## 2021-04-03 LAB — MAGNESIUM: Magnesium: 1.4 mg/dL — ABNORMAL LOW (ref 1.7–2.4)

## 2021-04-03 MED ORDER — CEFTRIAXONE IV (FOR PTA / DISCHARGE USE ONLY): 2.0000 g | INTRAVENOUS | 0 refills | Status: AC

## 2021-04-03 MED ORDER — MAGNESIUM SULFATE 2 GM/50ML IV SOLN
2.0000 g | Freq: Once | INTRAVENOUS | Status: AC
  Administered 2021-04-03: 2 g via INTRAVENOUS
  Filled 2021-04-03: qty 50

## 2021-04-03 NOTE — Progress Notes (Signed)
Inpatient Diabetes Program Recommendations  AACE/ADA: New Consensus Statement on Inpatient Glycemic Control (2015)  Target Ranges:  Prepandial:   less than 140 mg/dL      Peak postprandial:   less than 180 mg/dL (1-2 hours)      Critically ill patients:  140 - 180 mg/dL   Lab Results  Component Value Date   GLUCAP 146 (H) 04/03/2021   HGBA1C 12.8 (H) 02/27/2021    Review of Glycemic Control Results for [name], [name] (MRN [id number]) as of 04/03/2021 10:06  Ref. Range 04/02/2021 06:22 04/02/2021 11:17 04/02/2021 16:09 04/02/2021 20:47 04/03/2021 05:53  Glucose-Capillary Latest Ref Range: 70 - 99 mg/dL 281 (H) 188 (H) 677 (H) 227 (H) 146 (H)   Diabetes history: DM2 Outpatient Diabetes medications:   Glargine 27 units daily, Humalog 10 units tid with meals, Victoza 1.2 mg daily Current orders for Inpatient glycemic control: Semglee 15 units daily, Metformin 1 gm qd  Inpatient Diabetes Program Recommendations:   Noted postprandial CBGs elevated. Please consider: -Add Novolog 3-4 units tid meal coverage if eats 50% -Add Novolog 0-9 units tid correction + hs 0-5 units Paged Dr. Katha Cabal.  Thank you, Emily Farmer. Emily Selman, RN, MSN, CDE  Diabetes Coordinator Inpatient Glycemic Control Team Team Pager [phone] (8am-5pm) 04/03/2021 10:14 AM

## 2021-04-03 NOTE — TOC Progression Note (Signed)
Transition of Care [facility]) - Progression Note    Patient Details  Name: Laylynn Campanella MRN: [id number] Date of Birth: Jul 11, 1955  Transition of Care [facility]) CM/SW Contact  Leone Haven, RN Phone Number: 04/03/2021, 11:50 AM  Clinical Narrative:    Patient is for dc today, she will need HHRN for iv abx, for charity because she does not have any insurance.  NCM made referral to [facility] with Frances Furbish who is doing charity today.  Jeri Modena with [employer] will supply the medication and teaching.        Expected Discharge Plan and Services                                                 Social Determinants of Health (SDOH) Interventions    Readmission Risk Interventions No flowsheet data found.

## 2021-04-03 NOTE — Progress Notes (Signed)
PHARMACY CONSULT NOTE FOR:  OUTPATIENT  PARENTERAL ANTIBIOTIC THERAPY (OPAT)  Indication: Klebsiella pneumonia bacteremia  Regimen: Ceftriaxone 2g IV Q24H  End date:  04/10/21   IV antibiotic discharge orders are pended. To discharging provider:  please sign these orders via discharge navigator,  Select New Orders & click on the button choice - Manage This Unsigned Work.     Thank you for allowing pharmacy to be a part of this patient's care.  Jani Gravel, PharmD PGY-1 Acute Care Resident  04/03/2021 10:46 AM

## 2021-04-03 NOTE — Plan of Care (Signed)

## 2021-04-03 NOTE — TOC Transition Note (Signed)
Transition of Care [facility]) - CM/SW Discharge Note   Patient Details  Name: Emily Farmer MRN: [id number] Date of Birth: 04-12-1956  Transition of Care [facility]) CM/SW Contact:  Leone Haven, RN Phone Number: 04/03/2021, 2:55 PM   Clinical Narrative:    Patient is for dc today, home with iv abx, Jeri Modena will come to do the teaching with patient this afternoon.  Frances Furbish will provide [facility] thru charity.  Patient lives with her sister who will not be able to assist with iv abx .  She states she taks Public transport to her MD apts .  NCM informed MD to make sure to sign the OPAT order for iv abx and if patient is on any other medications to send to [facility] pharmacy to fill.  Patient PCP is [facility].      Final next level of care: Home w Home Health Services Barriers to Discharge: No Barriers Identified   Patient Goals and CMS Choice Patient states their goals for this hospitalization and ongoing recovery are:: return home with sister CMS Medicare.gov Compare Post Acute Care list provided to:: Patient Choice offered to / list presented to : Patient  Discharge Placement                       Discharge Plan and Services   Discharge Planning Services: CM Consult Post Acute Care Choice: Home Health            DME Agency: NA       HH Arranged: RN HH Agency: [facility] Date [facility] Agency Contacted: 04/03/21 Time HH Agency Contacted: 1100 Representative spoke with at [facility] Agency: Kandee Keen  Social Determinants of Health (SDOH) Interventions     Readmission Risk Interventions Readmission Risk Prevention Plan 04/03/2021  Transportation Screening Complete  PCP or Specialist Appt within 5-7 Days Complete  Home Care Screening Complete  Medication Review (RN CM) Complete  Some recent data might be hidden

## 2021-04-03 NOTE — Progress Notes (Signed)
FPTS Brief Progress Note  S:Pt states she is ok.    O: BP (!) 122/91 (BP Location: Right Arm)   Pulse 89   Temp 98 F (36.7 C) (Oral)   Resp 18   Ht 5\' 3"  (1.6 m)   Wt 76.9 kg   SpO2 94%   BMI 30.03 kg/m    GEN: tossing and turning in bed   A/P: Most recent CBG 227  - Orders reviewed. Labs for AM ordered, which was adjusted as needed.    , DO 04/03/2021, 4:34 AM PGY-3, [facility] Night Resident  Please page [phone] with questions.

## 2021-04-03 NOTE — TOC Initial Note (Signed)
Transition of Care [facility]) - Initial/Assessment Note    Patient Details  Name: Emily Farmer MRN: [id number] Date of Birth: 1956-05-22  Transition of Care [facility]) CM/SW Contact:    Leone Haven, RN Phone Number: 04/03/2021, 2:43 PM  Clinical Narrative:                 Patient is for dc today, home with iv abx, Jeri Modena will come to do the teaching with patient this afternoon.  Frances Furbish will provide [facility] thru charity.  Patient lives with her sister who will not be able to assist with iv abx .  She states she taks Public transport to her MD apts .  NCM informed MD to make sure to sign the OPAT order for iv abx and if patient is on any other medications to send to [facility] pharmacy to fill.  Patient PCP is [facility].   Expected Discharge Plan: Home w Home Health Services Barriers to Discharge: No Barriers Identified   Patient Goals and CMS Choice Patient states their goals for this hospitalization and ongoing recovery are:: return home with sister CMS Medicare.gov Compare Post Acute Care list provided to:: Patient Choice offered to / list presented to : Patient  Expected Discharge Plan and Services Expected Discharge Plan: Home w Home Health Services   Discharge Planning Services: CM Consult Post Acute Care Choice: Home Health Living arrangements for the past 2 months: Single Family Home                   DME Agency: NA       HH Arranged: RN HH Agency: [facility] Date [facility] Agency Contacted: 04/03/21 Time HH Agency Contacted: 1100 Representative spoke with at [facility] Agency: Kandee Keen  Prior Living Arrangements/Services Living arrangements for the past 2 months: Single Family Home Lives with:: Siblings Patient language and need for interpreter reviewed:: Yes Do you feel safe going back to the place where you live?: Yes      Need for Family Participation in Patient Care: No (Comment) Care giver support system in place?: Yes (comment)   Criminal Activity/Legal  Involvement Pertinent to Current Situation/Hospitalization: No - Comment as needed  Activities of Daily Living      Permission Sought/Granted                  Emotional Assessment Appearance:: Appears stated age Attitude/Demeanor/Rapport: Engaged Affect (typically observed): Appropriate Orientation: : Oriented to  Time, Oriented to Situation, Oriented to Place, Oriented to Self Alcohol / Substance Use: Not Applicable Psych Involvement: No (comment)  Admission diagnosis:  Bacteremia [R78.81] Atrial fibrillation with RVR (HCC) [I48.91] Acute pyelonephritis [N10] Patient Active Problem List   Diagnosis Date Noted   Bacteremia 04/01/2021   Atrial fibrillation with RVR (HCC) 03/31/2021   Pulmonary hypertension (HCC) 03/19/2021   Hypomagnesemia    Sepsis (HCC) 02/27/2021   Atrial fibrillation with rapid ventricular response (HCC) 02/27/2021   Hypotension 02/27/2021   Secondary hypercoagulable state (HCC) 05/08/2020   Type 2 diabetes mellitus with diabetic polyneuropathy, with long-term current use of insulin (HCC) 09/23/2019   Uncontrolled type 2 diabetes mellitus with hyperglycemia, with long-term current use of insulin (HCC) 09/23/2019   Dyslipidemia 09/23/2019   Toenail fungus 07/19/2018   Depression 07/19/2018   Iron deficiency anemia due to chronic blood loss 07/19/2018   Gastritis and duodenitis 07/19/2018   Chronic a-fib (HCC) 01/28/2018   Hyponatremia 01/28/2018   Chronic diastolic CHF (congestive heart failure) (HCC) 01/28/2018   Mixed  hyperlipidemia 06/01/2017   Paroxysmal A-fib (HCC) 10/04/2016   Hypokalemia 01/04/2014   Acute pyelonephritis 02/15/2013   Essential hypertension 02/15/2013   Hypertriglyceridemia 02/15/2013   Diabetes mellitus type 2 in obese [facility]) 02/15/2013   Coronary atherosclerosis seen on CT 02/15/2013   PCP:  Marcine Matar, MD Pharmacy:   [facility] [address] [place]  [address] Phone: [phone] Fax: [phone]  [id number] - [place], [place] - [address] [place] [address] Phone: [phone] Fax: [phone]     Social Determinants of Health (SDOH) Interventions    Readmission Risk Interventions Readmission Risk Prevention Plan 04/03/2021  Transportation Screening Complete  PCP or Specialist Appt within 5-7 Days Complete  Home Care Screening Complete  Medication Review (RN CM) Complete  Some recent data might be hidden

## 2021-04-03 NOTE — Discharge Instructions (Addendum)
Dear Emily Farmer,   Thank you for letting us participate in your care! In this section, you will find a brief hospital admission summary of why you were admitted to the hospital, what happened during your admission, your diagnosis/diagnoses, and recommended follow up.  You were admitted because you were experiencing vomiting and chills (rigors).  Your testing revealed an infection of the kidneys.  You were diagnosed with pyelonephritis (an infection of the kidneys). You were treated with IV antibiotics (ceftriaxone).  You were also seen by infectious disease specialists. They recommended an echocardiogram.  Your vomiting and chills improved and you were discharged from the hospital for meeting this goal.    POST-HOSPITAL & CARE INSTRUCTIONS Please see your PCP soon after discharge for help with obtaining an echocardiogram and diabetes control.  Please let PCP/Specialists know of any changes in medications that were made.  Please see medications section of this packet for any medication changes.   DOCTOR'S APPOINTMENTS & FOLLOW UP Future Appointments  Date Time Provider Department Center  05/07/2021  2:30 PM Marcine Matar, MD [facility] None  07/22/2021  2:30 PM Alver Sorrow, NP [facility]     Thank you for choosing [facility]! Take care and be well!  Family Medicine Teaching Service Inpatient Team [place]  [facility]  [address] [place], [place] [address] [phone]

## 2021-04-03 NOTE — Discharge Summary (Addendum)
[facility] Discharge Summary  Patient name: Emily Farmer Medical record number: [id number] Date of birth: 04/22/1956 Age: 65 y.o. Gender: female Date of Admission: 03/31/2021  Date of Discharge: 04/03/2021 Admitting Physician: Alen Bleacher, MD  Primary Care Provider: Ladell Pier, MD Consultants: Infectious Disease  Indication for Hospitalization: Pyelonephritis  Discharge Diagnoses/Problem List:  Pyelonephritis Klebsiella bacteremia Afib with RVR Uncontrolled T2DM HFpEF HLD IDA   Disposition: home  Discharge Condition: stable, improved  Discharge Exam:  Physical Exam Vitals reviewed.  Cardiovascular:     Rate and Rhythm: Normal rate. Rhythm irregular.  Pulmonary:     Effort: Pulmonary effort is normal.     Breath sounds: Normal breath sounds.  Abdominal:     General: Bowel sounds are normal.     Palpations: Abdomen is soft.     Tenderness: There is no abdominal tenderness.  Neurological:     Mental Status: She is alert.     Brief Hospital Course:  Emily Farmer is a 65 y.o. female presenting with emesis and rigors. PMH is significant for paroxysmal A fib, HFpEF, T2DM, HLD, and Iron deficiency anemia    Klebsiella bacteremia, severe sepsis On previous visit, patient presented to the ED on 10/15 for palpitations, found to be in A fib with RVR. Her heart rate decreased to the 90's with dilt bolus. UA infected, thought to be the cause of RVR. Sent home with a script for Keflex, which was never picked up. Patient reports she began having her present symptoms and that this prevented her from picking up the script. Blood culture returned positive 24 hrs later with Klebsiella. She presented to the ED on 10/16 with emesis, fever (recorded by EMS), CVA tenderness, UA with nitrites and a few WBCs, concerning for pyelo. Met severe sepsis criteria with tachycardia, tachypnea, leukocytosis, and lactate of 2.2. Ceftriaxone was started with rapid  improvement in symptoms. The next morning the patient appeared well and denied anymore systemic symptoms, this continued until discharge. Given her repeated klebsiella UTIs and bacteremias, it was thought a urinary nidus of infection might be present, however, a renal US was negative for anomaly of the kidneys and bladder. It was also felt that her T2DM was likely a strong factor in her presentations. This was discussed with the patient on multiple occasions along with the need for better glycemic control.  On the day before discharge a midline IV was placed and she was educated on administration of her ceftriaxone. This was set for a 10 day course to go from 10/16 to 10/25.   Afib with RVR Patient with paroxysmal AF. Presented with HR 184, successfully rate controlled with a diltiazem drip. She was switched to oral Lopressor 50 mg q8hrs with HR from 90-110. Given, this she was discharged on Toprol 100 mg BID.   Uncontrolled Type II Diabetes  Glucose on admission was 357. Most recent hgb A1c was 12.8 (9/14). She was on home medication of Insulin Glargine 27unit at bed time, 10U of prandial Humalog, metformin 250 mg Q6H, and Victoza 1.2 mg daily. Clearly the patient needs better glycemic control. She reports not taking her prandial insulin because she is at work during the day. During the admission her blood sugars ranged from 100's to 200's. She was started on 15U of glargine, which was increased to 20U. On the day before D/C, her sliding scale was discontinued in preparation for discharge. Her fasting CBG was 146, indicating that she responds well to insulin when it is  taken.  Her discharge diabetic regimen was: glargine 27U, no prandial insulin (due to patient not taking it), metformin at 250 mg q6hrs (given that patient had nausea with consolidation to 1,000 mg daily), Victoza daily (with recommendation to switch to Trulicity for ease of dosing).   HFpEF September echo: EF of 24-82%, diastolic  parameters indeterminate, TR mod to severe. G2DD one year ago. No signs of volume overload on exam.    Elevated Cr Likely pre-renal. Bl of 0.8-1.0. peak of 1.23. Discharge Cr of 0.9.   HLD On home medication of Zetia 28m daily and Lipitor 475mdaily. Continued home meds.    Iron deficiency anaemia  Hgb on admission was 11.2 and MCV 89.6. She is on home medication of ferrous sulfate 325 mg twice daily. Last colonoscopy in 2020, which was unremarkable. Held home medication of ferrous sulfate 325 given infection.  Follow-up recommendations: -Obtain test of cure (UA/urine culture) on discharge after abx course -f/u TTE results to rule out vegetations from bacteremia -Switch from Victoza to Trulicity for ease of dosing -Continue metformin 250 mg q6hrs   Significant Procedures: midline IV placement  Significant Labs and Imaging:  Recent Labs  Lab 04/01/21 0656 04/02/21 0319 04/03/21 0515  WBC 11.1* 11.0* 7.3  HGB 11.4* 11.2* 10.5*  HCT 34.8* 34.4* 31.8*  PLT 171 195 215   Recent Labs  Lab 03/30/21 0535 03/31/21 2204 04/01/21 0656 04/01/21 0833 04/01/21 2038 04/02/21 0319 04/03/21 0515  NA 130* 133* 133*  --   --  136 133*  K 4.1 3.8 3.9  --   --  4.6 4.0  CL 99 104 101  --   --  102 98  CO2 19* 19* 23  --   --  23 25  GLUCOSE 341* 357* 206*  --   --  198* 162*  BUN 29* 31* 25*  --   --  21 20  CREATININE 1.28* 1.23* 1.04*  --   --  1.09* 0.94  CALCIUM 9.0 8.2* 8.6*  --   --  9.0 8.7*  MG  --   --   --  1.1* 2.0 1.9 1.4*  ALKPHOS 146* 153* 134*  --   --  175*  --   AST _0 --   --  15  --   ALT _1 --   --  12  --   ALBUMIN 3.2* 2.7* 2.6*  --   --  2.6*  --     Results/Tests Pending at Time of Discharge: echocardiogram not completed.  Discharge Medications:  Allergies as of 04/03/2021   No Known Allergies      Medication List     STOP taking these medications    cephALEXin 500 MG capsule Commonly known as: KEFLEX   insulin lispro 100  UNIT/ML KwikPen Commonly known as: HumaLOG KwikPen   mupirocin ointment 2 % Commonly known as: BACTROBAN       TAKE these medications    acetaminophen 325 MG tablet Commonly known as: TYLENOL Take 2 tablets (650 mg total) by mouth every 6 (six) hours as needed for mild pain (or Fever >/= 101).   atorvastatin 40 MG tablet Commonly known as: LIPITOR Take 1 tablet (40 mg total) by mouth daily.   Basaglar KwikPen 100 UNIT/ML Inject 27 Units into the skin at bedtime. What changed: when to take this   cefTRIAXone  IVPB Commonly known as: ROCEPHIN Inject 2 g into the vein daily for  7 days. Indication:  Klebsiella pneumonia bacteremia  First Dose: Yes Last Day of Therapy:  04/10/21 Labs - Once weekly:  CBC/D and BMP, Labs - Every other week:  ESR and CRP Method of administration: IV Push Method of administration may be changed at the discretion of home infusion pharmacist based upon assessment of the patient and/or caregiver's ability to self-administer the medication ordered.   CENTRUM SILVER PO Take 1 tablet by mouth daily.   Eliquis 5 MG Tabs tablet Generic drug: apixaban Take 1 tablet (5 mg total) by mouth 2 (two) times daily.   ezetimibe 10 MG tablet Commonly known as: Zetia Take 1 tablet (10 mg total) by mouth daily. To help lower lipids   FeroSul 325 (65 FE) MG tablet Generic drug: ferrous sulfate TAKE 1 TABLET (325 MG TOTAL) BY MOUTH 2 (TWO) TIMES DAILY WITH A MEAL. What changed:  how much to take how to take this when to take this   glucose blood test strip USE AS INSTRUCTED TO CHECK BLOOD SUGARS 3 TIMES DAILY What changed:  how much to take how to take this when to take this additional instructions   Insulin Syringe-Needle U-100 31G X 5/16" 0.3 ML Misc Commonly known as: TRUEplus Insulin Syringe Use to inject insulin daily. What changed:  how much to take how to take this when to take this   lisinopril 5 MG tablet Commonly known as: ZESTRIL Take  1 tablet (5 mg total) by mouth daily.   metFORMIN 500 MG tablet Commonly known as: GLUCOPHAGE Take 0.5 tablets (250 mg total) by mouth 4 (four) times daily.   metoprolol succinate 100 MG 24 hr tablet Commonly known as: TOPROL-XL Take 1 tablet (100 mg total) by mouth 2 (two) times daily. Take with or immediately following a meal.   True Metrix Meter w/Device Kit Use to monitor blood sugar as directed What changed:  how much to take how to take this when to take this   Victoza 18 MG/3ML Sopn Generic drug: liraglutide Inject 1.2 mg into the skin daily.               Discharge Care Instructions  (From admission, onward)           Start     Ordered   04/03/21 0000  Change dressing on IV access line weekly and PRN  (Home infusion instructions - [facility] )        04/03/21 1443            Discharge Instructions: Please refer to Patient Instructions section of EMR for full details.  Patient was counseled important signs and symptoms that should prompt return to medical care, changes in medications, dietary instructions, activity restrictions, and follow up appointments.    Corky Sox, MD PGY-1 [place] Intern pager: [phone], text pages welcome  [place] Upper-Level Resident Addendum   I have discussed the above with Dr. Alvie Heidelberg and agree with the documented plan. My edits for correction/addition/clarification are included above. Please see any attending notes.   Alcus Dad, MD PGY-2, [place]

## 2021-04-03 NOTE — Progress Notes (Signed)
INFECTIOUS DISEASE PROGRESS NOTE  ID: Emily Farmer is a 65 y.o. female with  Active Problems:   Atrial fibrillation with RVR (HCC)   Bacteremia  Subjective: No complaints  Abtx:  Anti-infectives (From admission, onward)    Start     Dose/Rate Route Frequency Ordered Stop   04/01/21 2200  cefTRIAXone (ROCEPHIN) 2 g in sodium chloride 0.9 % 100 mL IVPB        2 g 200 mL/hr over 30 Minutes Intravenous Every 24 hours 04/01/21 0116     04/01/21 0145  cefTRIAXone (ROCEPHIN) 1 g in sodium chloride 0.9 % 100 mL IVPB        1 g 200 mL/hr over 30 Minutes Intravenous  Once 04/01/21 0139 04/01/21 0246   03/31/21 2245  cefTRIAXone (ROCEPHIN) 1 g in sodium chloride 0.9 % 100 mL IVPB        1 g 200 mL/hr over 30 Minutes Intravenous  Once 03/31/21 2233 03/31/21 2337       Medications: Scheduled:  apixaban  5 mg Oral BID   atorvastatin  40 mg Oral Daily   ezetimibe  10 mg Oral Daily   insulin glargine-yfgn  20 Units Subcutaneous Daily   lisinopril  5 mg Oral Daily   metFORMIN  1,000 mg Oral Q breakfast   metoprolol tartrate  50 mg Oral Q8H   sodium chloride flush  10-40 mL Intracatheter Q12H    Objective: Vital signs in last 24 hours: Temp:  [98 F (36.7 C)-98.5 F (36.9 C)] 98 F (36.7 C) (10/19 0730) Pulse Rate:  [89-109] 101 (10/19 0730) Resp:  [17-18] 18 (10/19 0730) BP: (106-150)/(54-95) 106/54 (10/19 0730) SpO2:  [92 %-96 %] 94 % (10/19 0730) Weight:  [75.8 kg] 75.8 kg (10/19 0427)   General appearance: alert, cooperative, and no distress Back: L CVA tenderness Resp: clear to auscultation bilaterally Cardio: regular rate and rhythm GI: normal findings: bowel sounds normal and soft, non-tender RUE PIC  Lab Results Recent Labs    04/02/21 0319 04/03/21 0515  WBC 11.0* 7.3  HGB 11.2* 10.5*  HCT 34.4* 31.8*  NA 136 133*  K 4.6 4.0  CL 102 98  CO2 23 25  BUN 21 20  CREATININE 1.09* 0.94   Liver Panel Recent Labs    04/01/21 0656 04/02/21 0319  PROT  6.3* 6.4*  ALBUMIN 2.6* 2.6*  AST 17 15  ALT 13 12  ALKPHOS 134* 175*  BILITOT 0.6 0.5   Sedimentation Rate No results for input(s): ESRSEDRATE in the last 72 hours. C-Reactive Protein No results for input(s): CRP in the last 72 hours.  Microbiology: Recent Results (from the past 240 hour(s))  Urine Culture     Status: Abnormal   Collection Time: 03/30/21  5:23 AM   Specimen: In/Out Cath Urine  Result Value Ref Range Status   Specimen Description IN/OUT CATH URINE  Final   Special Requests   Final    NONE Performed at [facility], [address] [place], [place] [address]    Culture >=100,000 COLONIES/mL KLEBSIELLA PNEUMONIAE (A)  Final   Report Status 04/01/2021 FINAL  Final   Organism ID, Bacteria KLEBSIELLA PNEUMONIAE (A)  Final      Susceptibility   Klebsiella pneumoniae - MIC*    AMPICILLIN >=32 RESISTANT Resistant     CEFAZOLIN <=4 SENSITIVE Sensitive     CEFEPIME <=0.12 SENSITIVE Sensitive     CEFTRIAXONE <=0.25 SENSITIVE Sensitive     CIPROFLOXACIN <=0.25 SENSITIVE Sensitive  GENTAMICIN <=1 SENSITIVE Sensitive     IMIPENEM <=0.25 SENSITIVE Sensitive     NITROFURANTOIN 64 INTERMEDIATE Intermediate     TRIMETH/SULFA <=20 SENSITIVE Sensitive     AMPICILLIN/SULBACTAM 4 SENSITIVE Sensitive     PIP/TAZO <=4 SENSITIVE Sensitive     * >=100,000 COLONIES/mL KLEBSIELLA PNEUMONIAE  Resp Panel by RT-PCR (Flu A&B, Covid) Nasopharyngeal Swab     Status: None   Collection Time: 03/30/21  5:24 AM   Specimen: Nasopharyngeal Swab; Nasopharyngeal(NP) swabs in vial transport medium  Result Value Ref Range Status   SARS Coronavirus 2 by RT PCR NEGATIVE NEGATIVE Final    Comment: (NOTE) SARS-CoV-2 target nucleic acids are NOT DETECTED.  The SARS-CoV-2 RNA is generally detectable in upper respiratory specimens during the acute phase of infection. The lowest concentration of SARS-CoV-2 viral copies this assay can detect is 138 copies/mL. A negative result does not  preclude SARS-Cov-2 infection and should not be used as the sole basis for treatment or other patient management decisions. A negative result may occur with  improper specimen collection/handling, submission of specimen other than nasopharyngeal swab, presence of viral mutation(s) within the areas targeted by this assay, and inadequate number of viral copies(<138 copies/mL). A negative result must be combined with clinical observations, patient history, and epidemiological information. The expected result is Negative.  Fact Sheet for Patients:  BloggerCourse.com  Fact Sheet for Healthcare Providers:  SeriousBroker.it  This test is no t yet approved or cleared by the [place] FDA and  has been authorized for detection and/or diagnosis of SARS-CoV-2 by FDA under an Emergency Use Authorization (EUA). This EUA will remain  in effect (meaning this test can be used) for the duration of the COVID-19 declaration under Section 564(b)(1) of the Act, 21 U.S.C.section 360bbb-3(b)(1), unless the authorization is terminated  or revoked sooner.       Influenza A by PCR NEGATIVE NEGATIVE Final   Influenza B by PCR NEGATIVE NEGATIVE Final    Comment: (NOTE) The Xpert Xpress SARS-CoV-2/FLU/RSV plus assay is intended as an aid in the diagnosis of influenza from Nasopharyngeal swab specimens and should not be used as a sole basis for treatment. Nasal washings and aspirates are unacceptable for Xpert Xpress SARS-CoV-2/FLU/RSV testing.  Fact Sheet for Patients: BloggerCourse.com  Fact Sheet for Healthcare Providers: SeriousBroker.it  This test is not yet approved or cleared by the [place] FDA and has been authorized for detection and/or diagnosis of SARS-CoV-2 by FDA under an Emergency Use Authorization (EUA). This EUA will remain in effect (meaning this test can be used) for the  duration of the COVID-19 declaration under Section 564(b)(1) of the Act, 21 U.S.C. section 360bbb-3(b)(1), unless the authorization is terminated or revoked.  Performed at [facility], [address], [place], [place] [address]   Blood culture (routine single)     Status: Abnormal   Collection Time: 03/30/21  5:35 AM   Specimen: BLOOD  Result Value Ref Range Status   Specimen Description BLOOD SITE NOT SPECIFIED  Final   Special Requests   Final    BOTTLES DRAWN AEROBIC AND ANAEROBIC Blood Culture adequate volume   Culture  Setup Time   Final    GRAM NEGATIVE RODS ANAEROBIC BOTTLE ONLY CRITICAL RESULT CALLED TO, READ BACK BY AND VERIFIED WITH: RN V POWELL  AT 0056 03/31/2021 BY L BENFIELD Performed at [facility], [address] [place], [place] [address]    Culture KLEBSIELLA PNEUMONIAE (A)  Final  Report Status 04/02/2021 FINAL  Final   Organism ID, Bacteria KLEBSIELLA PNEUMONIAE  Final      Susceptibility   Klebsiella pneumoniae - MIC*    AMPICILLIN >=32 RESISTANT Resistant     CEFAZOLIN <=4 SENSITIVE Sensitive     CEFEPIME <=0.12 SENSITIVE Sensitive     CEFTAZIDIME <=1 SENSITIVE Sensitive     CEFTRIAXONE <=0.25 SENSITIVE Sensitive     CIPROFLOXACIN <=0.25 SENSITIVE Sensitive     GENTAMICIN <=1 SENSITIVE Sensitive     IMIPENEM <=0.25 SENSITIVE Sensitive     TRIMETH/SULFA <=20 SENSITIVE Sensitive     AMPICILLIN/SULBACTAM 8 SENSITIVE Sensitive     PIP/TAZO <=4 SENSITIVE Sensitive     * KLEBSIELLA PNEUMONIAE  Blood Culture ID Panel (Reflexed)     Status: Abnormal   Collection Time: 03/30/21  5:35 AM  Result Value Ref Range Status   Enterococcus faecalis NOT DETECTED NOT DETECTED Final   Enterococcus Faecium NOT DETECTED NOT DETECTED Final   Listeria monocytogenes NOT DETECTED NOT DETECTED Final   Staphylococcus species NOT DETECTED NOT DETECTED Final   Staphylococcus aureus (BCID) NOT DETECTED NOT DETECTED Final   Staphylococcus epidermidis NOT DETECTED  NOT DETECTED Final   Staphylococcus lugdunensis NOT DETECTED NOT DETECTED Final   Streptococcus species NOT DETECTED NOT DETECTED Final   Streptococcus agalactiae NOT DETECTED NOT DETECTED Final   Streptococcus pneumoniae NOT DETECTED NOT DETECTED Final   Streptococcus pyogenes NOT DETECTED NOT DETECTED Final   A.calcoaceticus-baumannii NOT DETECTED NOT DETECTED Final   Bacteroides fragilis NOT DETECTED NOT DETECTED Final   Enterobacterales DETECTED (A) NOT DETECTED Final    Comment: Enterobacterales represent a large order of gram negative bacteria, not a single organism. CRITICAL RESULT CALLED TO, READ BACK BY AND VERIFIED WITH: RN V POWELL AT 0056 03/31/2021 BY L BENFIELD    Enterobacter cloacae complex NOT DETECTED NOT DETECTED Final   Escherichia coli NOT DETECTED NOT DETECTED Final   Klebsiella aerogenes NOT DETECTED NOT DETECTED Final   Klebsiella oxytoca NOT DETECTED NOT DETECTED Final   Klebsiella pneumoniae DETECTED (A) NOT DETECTED Final    Comment: CRITICAL RESULT CALLED TO, READ BACK BY AND VERIFIED WITH: RN V POWELL AT 0056 03/31/2021 BY L BENFIELD    Proteus species NOT DETECTED NOT DETECTED Final   Salmonella species NOT DETECTED NOT DETECTED Final   Serratia marcescens NOT DETECTED NOT DETECTED Final   Haemophilus influenzae NOT DETECTED NOT DETECTED Final   Neisseria meningitidis NOT DETECTED NOT DETECTED Final   Pseudomonas aeruginosa NOT DETECTED NOT DETECTED Final   Stenotrophomonas maltophilia NOT DETECTED NOT DETECTED Final   Candida albicans NOT DETECTED NOT DETECTED Final   Candida auris NOT DETECTED NOT DETECTED Final   Candida glabrata NOT DETECTED NOT DETECTED Final   Candida krusei NOT DETECTED NOT DETECTED Final   Candida parapsilosis NOT DETECTED NOT DETECTED Final   Candida tropicalis NOT DETECTED NOT DETECTED Final   Cryptococcus neoformans/gattii NOT DETECTED NOT DETECTED Final   CTX-M ESBL NOT DETECTED NOT DETECTED Final   Carbapenem resistance  IMP NOT DETECTED NOT DETECTED Final   Carbapenem resistance KPC NOT DETECTED NOT DETECTED Final   Carbapenem resistance NDM NOT DETECTED NOT DETECTED Final   Carbapenem resist OXA 48 LIKE NOT DETECTED NOT DETECTED Final   Carbapenem resistance VIM NOT DETECTED NOT DETECTED Final    Comment: Performed at [facility], [address] [place], [place] [address]  Blood culture (routine x 2)     Status: None (Preliminary result)  Collection Time: 03/31/21  9:50 PM   Specimen: BLOOD RIGHT ARM  Result Value Ref Range Status   Specimen Description BLOOD RIGHT ARM  Final   Special Requests   Final    BOTTLES DRAWN AEROBIC AND ANAEROBIC Blood Culture results may not be optimal due to an inadequate volume of blood received in culture bottles   Culture   Final    NO GROWTH 3 DAYS Performed at [facility], [address], [place], [place] [address]    Report Status PENDING  Incomplete  Urine Culture     Status: Abnormal   Collection Time: 03/31/21 10:04 PM   Specimen: Urine, Clean Catch  Result Value Ref Range Status   Specimen Description URINE, CLEAN CATCH  Final   Special Requests   Final    NONE Performed at [facility], [address] [place], [place] [address]    Culture MULTIPLE SPECIES PRESENT, SUGGEST RECOLLECTION (A)  Final   Report Status 04/01/2021 FINAL  Final  Blood culture (routine x 2)     Status: None (Preliminary result)   Collection Time: 03/31/21 10:58 PM   Specimen: BLOOD LEFT FOREARM  Result Value Ref Range Status   Specimen Description BLOOD LEFT FOREARM  Final   Special Requests   Final    BOTTLES DRAWN AEROBIC AND ANAEROBIC Blood Culture adequate volume   Culture   Final    NO GROWTH 2 DAYS Performed at [facility], [address], [place], [place] [address]    Report Status PENDING  Incomplete  Resp Panel by RT-PCR (Flu A&B, Covid)     Status: None   Collection Time: 03/31/21 11:05 PM   Specimen: Nasopharyngeal(NP) swabs in vial  transport medium  Result Value Ref Range Status   SARS Coronavirus 2 by RT PCR NEGATIVE NEGATIVE Final    Comment: (NOTE) SARS-CoV-2 target nucleic acids are NOT DETECTED.  The SARS-CoV-2 RNA is generally detectable in upper respiratory specimens during the acute phase of infection. The lowest concentration of SARS-CoV-2 viral copies this assay can detect is 138 copies/mL. A negative result does not preclude SARS-Cov-2 infection and should not be used as the sole basis for treatment or other patient management decisions. A negative result may occur with  improper specimen collection/handling, submission of specimen other than nasopharyngeal swab, presence of viral mutation(s) within the areas targeted by this assay, and inadequate number of viral copies(<138 copies/mL). A negative result must be combined with clinical observations, patient history, and epidemiological information. The expected result is Negative.  Fact Sheet for Patients:  BloggerCourse.com  Fact Sheet for Healthcare Providers:  SeriousBroker.it  This test is no t yet approved or cleared by the [place] FDA and  has been authorized for detection and/or diagnosis of SARS-CoV-2 by FDA under an Emergency Use Authorization (EUA). This EUA will remain  in effect (meaning this test can be used) for the duration of the COVID-19 declaration under Section 564(b)(1) of the Act, 21 U.S.C.section 360bbb-3(b)(1), unless the authorization is terminated  or revoked sooner.       Influenza A by PCR NEGATIVE NEGATIVE Final   Influenza B by PCR NEGATIVE NEGATIVE Final    Comment: (NOTE) The Xpert Xpress SARS-CoV-2/FLU/RSV plus assay is intended as an aid in the diagnosis of influenza from Nasopharyngeal swab specimens and should not be used as a sole basis for treatment. Nasal washings and aspirates are unacceptable for Xpert Xpress SARS-CoV-2/FLU/RSV testing.  Fact  Sheet for  Patients: BloggerCourse.com  Fact Sheet for Healthcare Providers: SeriousBroker.it  This test is not yet approved or cleared by the [place] FDA and has been authorized for detection and/or diagnosis of SARS-CoV-2 by FDA under an Emergency Use Authorization (EUA). This EUA will remain in effect (meaning this test can be used) for the duration of the COVID-19 declaration under Section 564(b)(1) of the Act, 21 U.S.C. section 360bbb-3(b)(1), unless the authorization is terminated or revoked.  Performed at [facility], [address], [place], [place] [address]     Studies/Results: US RENAL  Result Date: 04/01/2021 CLINICAL DATA:  Recurrent UTI EXAM: RENAL / URINARY TRACT ULTRASOUND COMPLETE COMPARISON:  CT 02/26/2021 no FINDINGS: Right Kidney: Renal measurements: 10.1 x 5.6 x 5.7 cm = volume: 168.3 mL. Echogenicity within normal limits. No mass or hydronephrosis visualized. Left Kidney: Renal measurements: 11.9 x 5.9 x 4.5 cm = volume: 166 mL. Echogenicity within normal limits. No mass or hydronephrosis visualized. Bladder: Appears normal for degree of bladder distention. Other: None. IMPRESSION: Negative examination Electronically Signed   By: Jasmine Pang M.D.   On: 04/01/2021 22:54     Assessment/Plan: Klebsiella UTI, bacteremia (recurrent) DM2, uncontrolled [A1C 12.8 (02-27-21)] Hypotension, tachycardia, BCx+, tachypnea, increased lactic acid- Sepsis.    Total days of antibiotics: 3 ceftriaxone  Would suggest 10 days of IV ceftriaxone Suggest her recurrent infections due to her DM and possible bladder dysfunction, colonization. May improved with improved DM control.  F/u TTE for completeness.  Available as needed.          Johny Sax MD, FACP Infectious Diseases (pager) [phone] [email or web address] 04/03/2021, 10:17 AM  LOS: 3 days

## 2021-04-03 NOTE — Progress Notes (Signed)
Limited 2D echocardiogram completed.  04/03/2021 3:37 PM Eula Fried., MHA, RVT, RDCS, RDMS

## 2021-04-03 NOTE — Plan of Care (Signed)
  Problem: Clinical Measurements: Goal: Ability to maintain clinical measurements within normal limits will improve 04/03/2021 1538 by Reino Kent, RN Outcome: Adequate for Discharge 04/03/2021 1438 by Reino Kent, RN Outcome: Progressing Goal: Will remain free from infection 04/03/2021 1538 by Reino Kent, RN Outcome: Adequate for Discharge 04/03/2021 1438 by Reino Kent, RN Outcome: Progressing Goal: Diagnostic test results will improve 04/03/2021 1538 by Reino Kent, RN Outcome: Adequate for Discharge 04/03/2021 1438 by Reino Kent, RN Outcome: Progressing Goal: Respiratory complications will improve 04/03/2021 1538 by Reino Kent, RN Outcome: Adequate for Discharge 04/03/2021 1438 by Reino Kent, RN Outcome: Progressing Goal: Cardiovascular complication will be avoided 04/03/2021 1538 by Reino Kent, RN Outcome: Adequate for Discharge 04/03/2021 1438 by Reino Kent, RN Outcome: Progressing

## 2021-04-04 ENCOUNTER — Telehealth: Payer: Self-pay

## 2021-04-04 ENCOUNTER — Other Ambulatory Visit: Payer: Self-pay | Admitting: Pharmacist

## 2021-04-04 ENCOUNTER — Ambulatory Visit: Payer: Self-pay | Admitting: *Deleted

## 2021-04-04 MED ORDER — APIXABAN 5 MG PO TABS
5.0000 mg | ORAL_TABLET | Freq: Two times a day (BID) | ORAL | 4 refills | Status: DC
  Filled 2021-04-04: qty 60, 30d supply, fill #0
  Filled 2021-06-09: qty 60, 30d supply, fill #1

## 2021-04-04 MED ORDER — APIXABAN 5 MG PO TABS
5.0000 mg | ORAL_TABLET | Freq: Two times a day (BID) | ORAL | 2 refills | Status: DC
  Filled 2021-04-04: qty 60, 30d supply, fill #0

## 2021-04-04 NOTE — Telephone Encounter (Signed)
Transition Care Management Unsuccessful Follow-up Telephone Call  Date of discharge and from where:  04/03/2021, [facility]   Attempts:  1st Attempt  Reason for unsuccessful TCM follow-up call:  Left voice message on # [phone]. Call back requested to this CM or Genella Rife, Charity fundraiser.  Patient has appointment with Georgian Co, NP 04/11/2021. Can inquire if she would prefer to schedule appointment with PCP on the same day.

## 2021-04-04 NOTE — Telephone Encounter (Signed)
Reason for Disposition  [1] Caller requesting NON-URGENT health information AND [2] PCP's office is the best resource    Emily Joiner, RN with [facility] passing on information from her visit with this pt.  Answer Assessment - Initial Assessment Questions 1. REASON FOR CALL or QUESTION: "What is your reason for calling today?" or "How can I best help you?" or "What question do you have that I can help answer?"     I returned call to Emily Joiner, RN [facility]. She has sepsis again.   She is getting an IV infusion by Dr. Ninetta Lights.  In the home she is out of Eliquis 5 mg twice a day.   She has A. Fib, it's chronic.   She is taking her sister's Eliquis which is the same dose.   Pt doesn't have insurance.   We are doing charity with her.   Her sister gets it from the [place].  They will do 3 visits.  She has UTI which is where the sepsis is coming from.   Her diabetes glucose never below 200.   She eats what she can and can afford.   We are going to attempt to get a Child psychotherapist in.  We need approval because she is charity.     She has a midline/PICC in.     [name] will get summary of all of her care planning that will need to be signed.   It should arrive in about a week to your office.  Protocols used: Information Only Call - No Triage-A-AH

## 2021-04-04 NOTE — Telephone Encounter (Addendum)
I returned the call to [place], RN with [facility] at [phone].  She had information to pass along to Dr. Jonah Blue after her visit with pt.  Pt is out of Eliquis.   She takes 5 mg twice a day for chronic A Fib.   She is taking her sister's 5 mg dose of Eliquis.   Her sister takes Eliquis from the [place] so had a 90 day supply that both of them are taking so Becca needs a new prescription for the Eliquis.    Pt has a midline catheter in her upper arm that she is getting an IV infusion through managed by Dr. Ninetta Lights with Infectious Disease for sepsis from a UTI.  Pt is a charity case for [facility].    See notes for more details and information.  I have sent my notes to Dr. Jonah Blue at [facility] and wellness.

## 2021-04-04 NOTE — Addendum Note (Signed)
Addended by: Jonah Blue B on: 04/04/2021 06:09 PM   Modules accepted: Orders

## 2021-04-05 ENCOUNTER — Other Ambulatory Visit: Payer: Self-pay

## 2021-04-05 ENCOUNTER — Telehealth: Payer: Self-pay

## 2021-04-05 LAB — CULTURE, BLOOD (ROUTINE X 2): Culture: NO GROWTH

## 2021-04-05 NOTE — Telephone Encounter (Signed)
Contacted pt and lvm making aware that Dr. Laural Benes sent medication to the pharmacy and returned Emily Farmer call and made aware

## 2021-04-05 NOTE — Telephone Encounter (Signed)
Transition Care Management Unsuccessful Follow-up Telephone Call   Date of discharge and from where:  04/03/2021, [facility]    Attempts: 2nd Attempt   Reason for unsuccessful TCM follow-up call:  Left voice message on # [phone]. Call back requested.   Patient has appointment with Georgian Co, NP 04/11/2021. Can inquire if she would prefer to schedule appointment with PCP on the same day.

## 2021-04-06 LAB — CULTURE, BLOOD (ROUTINE X 2)
Culture: NO GROWTH
Special Requests: ADEQUATE

## 2021-04-09 ENCOUNTER — Telehealth: Payer: Self-pay | Admitting: *Deleted

## 2021-04-09 NOTE — Telephone Encounter (Signed)
Copied from CRM [id number]. Topic: General - Other >> Apr 09, 2021  3:12 PM Aretta Nip wrote: Reason for CRM: Pt has called stating  to give Dr Shela Commons the message that she went to [facility] for her Mammogram and they do not take her ins which is a Family Medicaid. FU with any suggestions@ [phone]

## 2021-04-10 ENCOUNTER — Telehealth: Payer: Self-pay

## 2021-04-10 NOTE — Telephone Encounter (Signed)
Returned pt call and provided phone number to the [facility] to call. Pt doesn't have any questions or concerns

## 2021-04-10 NOTE — Telephone Encounter (Signed)
Transition Care Management Follow-up Telephone Call Date of discharge and from where: 04/03/2021, [facility]  How have you been since you were released from the hospital? She stated that she is feeling okay.  She just completed her course of IV antibiotics and the home health nurse is coming today to remove her PICC line.  Any questions or concerns? Yes- she said that the radiology office that she called does not accept her insurance - family planning medicaid. Explained to her that she can apply for the Mammogram Scholarship Fund through [place] and there would be no cost for the mammogram if she is eligible. She can pick up the scholarship application tomorrow at [facility] at her appointment. This information was added to the appointment notes.   Items Reviewed: Did the pt receive and understand the discharge instructions provided? Yes  Medications obtained and verified? Yes - She said that she has all of her medications and did not have any questions about her med regime.  Other? No  Any new allergies since your discharge? No  Do you have support at home?  She lives with her sister.  Home Care and Equipment/Supplies: Were home health services ordered? yes If so, what is the name of the agency? [facility]  Has the agency set up a time to come to the patient's home? Yes - they have already seen her and plan to see her again this afternoon to remove the PICC line. Were any new equipment or medical supplies ordered?  Yes: IV antibiotics and supplies as well as PICC dressing supplies. . What is the name of the medical supply agency? [facility] provided the home infusion/supplies.  Were you able to get the supplies/equipment? yes Do you have any questions related to the use of the equipment or supplies? No  She has a glucometer   Functional Questionnaire: (I = Independent and D = Dependent) ADLs: independent.    Follow up appointments reviewed:  PCP Hospital f/u appt confirmed? Yes  Scheduled to  see Georgian Co, PA  on 04/11/2021.  Specialist Hospital f/u appt confirmed? Yes  Scheduled to see cardiology- 04/22/2021, endocrinology- 05/16/2021.  Are transportation arrangements needed? No - she takes public transportation. Instructed her to contact this office if she is not able to get the bus, and [facility] [employer] may be arranged If their condition worsens, is the pt aware to call PCP or go to the Emergency Dept.? Yes Was the patient provided with contact information for the PCP's office or ED? Yes Was to pt encouraged to call back with questions or concerns? Yes

## 2021-04-11 ENCOUNTER — Other Ambulatory Visit: Payer: Self-pay

## 2021-04-11 ENCOUNTER — Ambulatory Visit: Payer: Self-pay | Attending: Physician Assistant | Admitting: Physician Assistant

## 2021-04-11 ENCOUNTER — Encounter: Payer: Self-pay | Admitting: Physician Assistant

## 2021-04-11 VITALS — BP 123/85 | HR 95 | Ht 63.0 in | Wt 167.0 lb

## 2021-04-11 DIAGNOSIS — E1142 Type 2 diabetes mellitus with diabetic polyneuropathy: Secondary | ICD-10-CM

## 2021-04-11 DIAGNOSIS — I4891 Unspecified atrial fibrillation: Secondary | ICD-10-CM

## 2021-04-11 DIAGNOSIS — N179 Acute kidney failure, unspecified: Secondary | ICD-10-CM

## 2021-04-11 DIAGNOSIS — A419 Sepsis, unspecified organism: Secondary | ICD-10-CM

## 2021-04-11 DIAGNOSIS — R6521 Severe sepsis with septic shock: Secondary | ICD-10-CM

## 2021-04-11 DIAGNOSIS — Z09 Encounter for follow-up examination after completed treatment for conditions other than malignant neoplasm: Secondary | ICD-10-CM

## 2021-04-11 DIAGNOSIS — D500 Iron deficiency anemia secondary to blood loss (chronic): Secondary | ICD-10-CM

## 2021-04-11 DIAGNOSIS — E871 Hypo-osmolality and hyponatremia: Secondary | ICD-10-CM

## 2021-04-11 DIAGNOSIS — N12 Tubulo-interstitial nephritis, not specified as acute or chronic: Secondary | ICD-10-CM

## 2021-04-11 LAB — POCT URINALYSIS DIP (CLINITEK)
Bilirubin, UA: NEGATIVE
Glucose, UA: 500 mg/dL — AB
Ketones, POC UA: NEGATIVE mg/dL
Leukocytes, UA: NEGATIVE
Nitrite, UA: NEGATIVE
POC PROTEIN,UA: 100 — AB
Spec Grav, UA: >=1.03 — AB (ref 1.010–1.025)
Urobilinogen, UA: 0.2 E.U./dL
pH, UA: 5.5 (ref 5.0–8.0)

## 2021-04-11 LAB — GLUCOSE, POCT (MANUAL RESULT ENTRY): POC Glucose: 219 mg/dl — AB (ref 70–99)

## 2021-04-11 MED ORDER — FERROUS SULFATE 325 (65 FE) MG PO TABS
325.0000 mg | ORAL_TABLET | Freq: Every day | ORAL | 4 refills | Status: DC
Start: 2021-04-11 — End: 2023-10-07
  Filled 2021-04-11: qty 30, 30d supply, fill #0

## 2021-04-11 NOTE — Progress Notes (Signed)
Patient ID: Emily Farmer, female   DOB: Jul 12, 1955, 65 y.o.   MRN: [id number]     Emily Farmer, is a 65 y.o. female  LOV:[id number]  OAC:[id number]  DOB - Dec 22, 1955  No chief complaint on file.      Subjective:   Emily Farmer is a 64 y.o. female here today for a follow up visit and fter hospitalization 10/16-10/19/2022 for UTI/pyelo and Klebsiella sepsis.  PICC line removed and antibiotics completed.  No fever.  No abdominal pain.  No flank pain.  Appetite is improving.  Needs RF on iron.  Has f/up scheduled with endocrine and CVD.  Denies CP, SOB  From discharge summary: Discharge Diagnoses/Problem List:  Pyelonephritis Klebsiella bacteremia Afib with RVR Uncontrolled T2DM HFpEF HLD IDA  Brief Hospital Course:  Emily Farmer is a 65 y.o. female presenting with emesis and rigors. PMH is significant for paroxysmal A fib, HFpEF, T2DM, HLD, and Iron deficiency anemia    Klebsiella bacteremia, severe sepsis On previous visit, patient presented to the ED on 10/15 for palpitations, found to be in A fib with RVR. Her heart rate decreased to the 90's with dilt bolus. UA infected, thought to be the cause of RVR. Sent home with a script for Keflex, which was never picked up. Patient reports she began having her present symptoms and that this prevented her from picking up the script. Blood culture returned positive 24 hrs later with Klebsiella. She presented to the ED on 10/16 with emesis, fever (recorded by EMS), CVA tenderness, UA with nitrites and a few WBCs, concerning for pyelo. Met severe sepsis criteria with tachycardia, tachypnea, leukocytosis, and lactate of 2.2. Ceftriaxone was started with rapid improvement in symptoms. The next morning the patient appeared well and denied anymore systemic symptoms, this continued until discharge. Given her repeated klebsiella UTIs and bacteremias, it was thought a urinary nidus of infection might be present, however, a renal US was negative for  anomaly of the kidneys and bladder. It was also felt that her T2DM was likely a strong factor in her presentations. This was discussed with the patient on multiple occasions along with the need for better glycemic control.  On the day before discharge a midline IV was placed and she was educated on administration of her ceftriaxone. This was set for a 10 day course to go from 10/16 to 10/25.   Afib with RVR Patient with paroxysmal AF. Presented with HR 184, successfully rate controlled with a diltiazem drip. She was switched to oral Lopressor 50 mg q8hrs with HR from 90-110. Given, this she was discharged on Toprol 100 mg BID.    Uncontrolled Type II Diabetes  Glucose on admission was 357. Most recent hgb A1c was 12.8 (9/14). She was on home medication of Insulin Glargine 27unit at bed time, 10U of prandial Humalog, metformin 250 mg Q6H, and Victoza 1.2 mg daily. Clearly the patient needs better glycemic control. She reports not taking her prandial insulin because she is at work during the day. During the admission her blood sugars ranged from 100's to 200's. She was started on 15U of glargine, which was increased to 20U. On the day before D/C, her sliding scale was discontinued in preparation for discharge. Her fasting CBG was 146, indicating that she responds well to insulin when it is taken.  Her discharge diabetic regimen was: glargine 27U, no prandial insulin (due to patient not taking it), metformin at 250 mg q6hrs (given that patient had nausea with consolidation to 1,000 mg daily),  Victoza daily (with recommendation to switch to Trulicity for ease of dosing).    HFpEF September echo: EF of 35-59%, diastolic parameters indeterminate, TR mod to severe. G2DD one year ago. No signs of volume overload on exam.    Elevated Cr Likely pre-renal. Bl of 0.8-1.0. peak of 1.23. Discharge Cr of 0.9.   HLD On home medication of Zetia 76m daily and Lipitor 432mdaily. Continued home meds.    Iron  deficiency anaemia  Hgb on admission was 11.2 and MCV 89.6. She is on home medication of ferrous sulfate 325 mg twice daily. Last colonoscopy in 2020, which was unremarkable. Held home medication of ferrous sulfate 325 given infection.   Follow-up recommendations: -Obtain test of cure (UA/urine culture) on discharge after abx course -f/u TTE results to rule out vegetations from bacteremia -Switch from Victoza to Trulicity for ease of dosing -Continue metformin 250 mg q6hrs  . Patient has No headache, No chest pain, No abdominal pain - No Nausea, No new weakness tingling or numbness, No Cough - SOB.  No problems updated.  ALLERGIES: No Known Allergies  PAST MEDICAL HISTORY: Past Medical History:  Diagnosis Date   Arthritis    [place] ([place]   a. 09/2016 in setting of pancreatitis;  b. 09/2016 Echo: EF 65-60%, no rwma, Gr2 DD, mild MR, triv TR, PASP 3046m;  CHA2DS2VASc = 4-->Eliquis 5mg77mD. c. Recurred 06/2018 in setting of GIB.   Chronic diastolic CHF (congestive heart failure) (HCC)[place]15/2019   Diabetes mellitus    GI bleeding 06/2018   Gout    Hyperlipidemia    Hypertension    Hypertriglyceridemia    Pancreatitis    a. 09/2016 - Triglycerides 1,392 on admission.    MEDICATIONS AT HOME: Prior to Admission medications   Medication Sig Start Date End Date Taking? Authorizing Provider  acetaminophen (TYLENOL) 325 MG tablet Take 2 tablets (650 mg total) by mouth every 6 (six) hours as needed for mild pain (or Fever >/= 101). 02/01/18  Yes Elgergawy, DawoSilver Huguenin  apixaban (ELIQUIS) 5 MG TABS tablet Take 1 tablet (5 mg total) by mouth 2 (two) times daily. 04/04/21 07/03/21 Yes JohnLadell Pier  atorvastatin (LIPITOR) 40 MG tablet Take 1 tablet (40 mg total) by mouth daily. 03/21/21  Yes WalkLoel Dubonnet  Blood Glucose Monitoring Suppl (TRUE METRIX METER) w/Device KIT Use to monitor blood sugar as directed Patient taking differently: 1 each by Other route See admin  instructions. Use to monitor blood sugar as directed 02/22/18  Yes Fulp, Cammie, MD  ezetimibe (ZETIA) 10 MG tablet Take 1 tablet (10 mg total) by mouth daily. To help lower lipids 03/21/21  Yes WalkLaurann MontanaNP  glucose blood test strip USE AS INSTRUCTED TO CHECK BLOOD SUGARS 3 TIMES DAILY Patient taking differently: 1 each by Other route See admin instructions. Check blood sugar 4 times daily 03/27/20  Yes Fulp, Cammie, MD  Insulin Glargine (BASAGLAR KWIKPEN) 100 UNIT/ML Inject 27 Units into the skin at bedtime. Patient taking differently: Inject 27 Units into the skin every evening. 03/19/21  Yes JohnLadell Pier  Insulin Syringe-Needle U-100 (TRUEPLUS INSULIN SYRINGE) 31G X 5/16" 0.3 ML MISC Use to inject insulin daily. Patient taking differently: 1 each by Other route See admin instructions. Use to inject insulin daily. 05/06/18  Yes Fulp, Cammie, MD  liraglutide (VICTOZA) 18 MG/3ML SOPN Inject 1.2 mg into the skin daily. 03/19/21  Yes JohnLadell Pier  lisinopril (ZESTRIL)  5 MG tablet Take 1 tablet (5 mg total) by mouth daily. 03/19/21  Yes Ladell Pier, MD  metFORMIN (GLUCOPHAGE) 500 MG tablet Take 0.5 tablets (250 mg total) by mouth 4 (four) times daily. 09/27/19  Yes Ladell Pier, MD  metoprolol succinate (TOPROL-XL) 100 MG 24 hr tablet Take 1 tablet (100 mg total) by mouth 2 (two) times daily. Take with or immediately following a meal. 03/21/21  Yes Loel Dubonnet, NP  Multiple Vitamins-Minerals (CENTRUM SILVER PO) Take 1 tablet by mouth daily.   Yes [provider]  ferrous sulfate 325 (65 FE) MG tablet Take 1 tablet (325 mg total) by mouth daily with breakfast. 04/11/21 04/11/22  Palmina Clodfelter, Dionne Bucy, PA-C    ROS: Neg HEENT Neg resp Neg cardiac Neg GI Neg GU Neg MS Neg psych Neg neuro  Objective:   Vitals:   04/11/21 1422  BP: 123/85  Pulse: 95  SpO2: 100%  Weight: 167 lb (75.8 kg)  Height: '5\' 3"'  (1.6 m)   Exam General appearance : Awake,  alert, not in any distress. Speech Clear. Not toxic looking HEENT: Atraumatic and Normocephalic Neck: Supple, no JVD. No cervical lymphadenopathy.  Chest: Good air entry bilaterally, CTAB.  No rales/rhonchi/wheezing CVS: S1 S2 irregular, no murmurs.  Extremities: B/L Lower Ext shows no edema, both legs are warm to touch Neurology: Awake alert, and oriented X 3, CN II-XII intact, Non focal Skin: No Rash  Data Review Lab Results  Component Value Date   HGBA1C 12.8 (H) 02/27/2021   HGBA1C 14.6 (A) 03/12/2020   HGBA1C >15 03/02/2020    Assessment & Plan   1. Pyelonephritis Completed antibiotics-UA dip is good today.  Drink ample water - POCT URINALYSIS DIP (CLINITEK) - Glucose (CBG) - Urine Culture - CBC with Differential/Platelet - Comprehensive metabolic panel  2. Iron deficiency anemia due to chronic blood loss continue - ferrous sulfate 325 (65 FE) MG tablet; Take 1 tablet (325 mg total) by mouth daily with breakfast.  Dispense: 30 tablet; Refill: 4 - CBC with Differential/Platelet  3. Type 2 diabetes mellitus with peripheral neuropathy (HCC) Uncontrolled-continue current regimen and followed by endocrine - Comprehensive metabolic panel  4. Hospital discharge follow-up  5. Atrial fibrillation with rapid ventricular response (HCC) Followed by cardiology-believed to be prompted by sepsis  6. Sepsis with acute renal failure and septic shock, due to unspecified organism, unspecified acute renal failure type ([place]) Completed antibiotics  7. Hyponatremia - Comprehensive metabolic panel    Patient have been counseled extensively about nutrition and exercise. Other issues discussed during this visit include: low cholesterol diet, weight control and daily exercise, foot care, annual eye examinations at Ophthalmology, importance of adherence with medications and regular follow-up. We also discussed long term complications of uncontrolled diabetes and hypertension.   Return for  keep appt scheduled with Dr Wynetta Emery for November.  The patient was given clear instructions to go to ER or return to medical center if symptoms don't improve, worsen or new problems develop. The patient verbalized understanding. The patient was told to call to get lab results if they haven't heard anything in the next week.      Freeman Caldron, PA-C [facility] [place], [place]   04/11/2021, 2:42 PM

## 2021-04-12 ENCOUNTER — Other Ambulatory Visit: Payer: Self-pay

## 2021-04-12 LAB — CBC WITH DIFFERENTIAL/PLATELET
Basophils Absolute: 0 10*3/uL (ref 0.0–0.2)
Basos: 0 %
EOS (ABSOLUTE): 0.4 10*3/uL (ref 0.0–0.4)
Eos: 4 %
Hematocrit: 39 % (ref 34.0–46.6)
Hemoglobin: 12.6 g/dL (ref 11.1–15.9)
Immature Grans (Abs): 0 10*3/uL (ref 0.0–0.1)
Immature Granulocytes: 0 %
Lymphocytes Absolute: 1.9 10*3/uL (ref 0.7–3.1)
Lymphs: 20 %
MCH: 28.3 pg (ref 26.6–33.0)
MCHC: 32.3 g/dL (ref 31.5–35.7)
MCV: 87 fL (ref 79–97)
Monocytes Absolute: 0.6 10*3/uL (ref 0.1–0.9)
Monocytes: 6 %
Neutrophils Absolute: 6.9 10*3/uL (ref 1.4–7.0)
Neutrophils: 70 %
Platelets: 421 10*3/uL (ref 150–450)
RBC: 4.46 x10E6/uL (ref 3.77–5.28)
RDW: 13.8 % (ref 11.7–15.4)
WBC: 9.8 10*3/uL (ref 3.4–10.8)

## 2021-04-12 LAB — COMPREHENSIVE METABOLIC PANEL
ALT: 9 IU/L (ref 0–32)
AST: 12 IU/L (ref 0–40)
Albumin/Globulin Ratio: 1.4 (ref 1.2–2.2)
Albumin: 4.2 g/dL (ref 3.8–4.8)
Alkaline Phosphatase: 187 IU/L — ABNORMAL HIGH (ref 44–121)
BUN/Creatinine Ratio: 26 (ref 12–28)
BUN: 27 mg/dL (ref 8–27)
Bilirubin Total: 0.4 mg/dL (ref 0.0–1.2)
CO2: 24 mmol/L (ref 20–29)
Calcium: 10 mg/dL (ref 8.7–10.3)
Chloride: 98 mmol/L (ref 96–106)
Creatinine, Ser: 1.03 mg/dL — ABNORMAL HIGH (ref 0.57–1.00)
Globulin, Total: 3 g/dL (ref 1.5–4.5)
Glucose: 181 mg/dL — ABNORMAL HIGH (ref 70–99)
Potassium: 5.2 mmol/L (ref 3.5–5.2)
Sodium: 139 mmol/L (ref 134–144)
Total Protein: 7.2 g/dL (ref 6.0–8.5)
eGFR: 61 mL/min/{1.73_m2} (ref >59)

## 2021-04-18 ENCOUNTER — Other Ambulatory Visit: Payer: Self-pay

## 2021-04-18 ENCOUNTER — Telehealth: Payer: Self-pay | Admitting: Internal Medicine

## 2021-04-18 NOTE — Telephone Encounter (Signed)
Burna Mortimer with [facility] called back in to follow up on orders. She has re-faxed. Please sign and fax back to her at: [phone]    Phone: [phone]

## 2021-04-18 NOTE — Telephone Encounter (Signed)
Copied from CRM [id number]. Topic: General - Call Back - No Documentation >> Apr 10, 2021 12:05 PM Randol Kern wrote: Reason for CRM:   Burna Mortimer called from [facility] on home health orders, states they are still waiting for correspondence from PCP.   Best contact: [phone]  Now states the order is one month out of compliance, needs follow up from provider.  Order number, [id number]   Order has been refaxed

## 2021-04-19 LAB — URINE CULTURE

## 2021-04-19 NOTE — Telephone Encounter (Signed)
Fax machine has been down, but the fax was sent yesterday back to them with Dr. Laural Benes signatures

## 2021-04-20 ENCOUNTER — Encounter (HOSPITAL_COMMUNITY): Payer: Self-pay | Admitting: Family Medicine

## 2021-04-20 ENCOUNTER — Observation Stay (HOSPITAL_COMMUNITY)
Admission: EM | Admit: 2021-04-20 | Discharge: 2021-04-22 | Disposition: A | Discharge status: Home / Self Care | Payer: Medicaid Other | Attending: Family Medicine | Admitting: Family Medicine

## 2021-04-20 ENCOUNTER — Emergency Department (HOSPITAL_COMMUNITY): Payer: Medicaid Other

## 2021-04-20 ENCOUNTER — Other Ambulatory Visit: Payer: Self-pay

## 2021-04-20 DIAGNOSIS — Z79899 Other long term (current) drug therapy: Secondary | ICD-10-CM | POA: Insufficient documentation

## 2021-04-20 DIAGNOSIS — Z7901 Long term (current) use of anticoagulants: Secondary | ICD-10-CM | POA: Insufficient documentation

## 2021-04-20 DIAGNOSIS — Z20822 Contact with and (suspected) exposure to covid-19: Secondary | ICD-10-CM | POA: Insufficient documentation

## 2021-04-20 DIAGNOSIS — I5032 Chronic diastolic (congestive) heart failure: Secondary | ICD-10-CM | POA: Insufficient documentation

## 2021-04-20 DIAGNOSIS — E119 Type 2 diabetes mellitus without complications: Secondary | ICD-10-CM | POA: Insufficient documentation

## 2021-04-20 DIAGNOSIS — Z7984 Long term (current) use of oral hypoglycemic drugs: Secondary | ICD-10-CM | POA: Insufficient documentation

## 2021-04-20 DIAGNOSIS — I4891 Unspecified atrial fibrillation: Principal | ICD-10-CM | POA: Diagnosis present

## 2021-04-20 DIAGNOSIS — I110 Hypertensive heart disease with heart failure: Secondary | ICD-10-CM | POA: Insufficient documentation

## 2021-04-20 DIAGNOSIS — I480 Paroxysmal atrial fibrillation: Secondary | ICD-10-CM | POA: Insufficient documentation

## 2021-04-20 LAB — CBC WITH DIFFERENTIAL/PLATELET
Abs Immature Granulocytes: 0.01 10*3/uL (ref 0.00–0.07)
Basophils Absolute: 0.1 10*3/uL (ref 0.0–0.1)
Basophils Relative: 1 %
Eosinophils Absolute: 0.2 10*3/uL (ref 0.0–0.5)
Eosinophils Relative: 3 %
HCT: 36.6 % (ref 36.0–46.0)
Hemoglobin: 11.8 g/dL — ABNORMAL LOW (ref 12.0–15.0)
Immature Granulocytes: 0 %
Lymphocytes Relative: 21 %
Lymphs Abs: 1.6 10*3/uL (ref 0.7–4.0)
MCH: 28.4 pg (ref 26.0–34.0)
MCHC: 32.2 g/dL (ref 30.0–36.0)
MCV: 88.2 fL (ref 80.0–100.0)
Monocytes Absolute: 0.6 10*3/uL (ref 0.1–1.0)
Monocytes Relative: 8 %
Neutro Abs: 4.9 10*3/uL (ref 1.7–7.7)
Neutrophils Relative %: 67 %
Platelets: 253 10*3/uL (ref 150–400)
RBC: 4.15 MIL/uL (ref 3.87–5.11)
RDW: 13.4 % (ref 11.5–15.5)
WBC: 7.3 10*3/uL (ref 4.0–10.5)
nRBC: 0 % (ref 0.0–0.2)

## 2021-04-20 LAB — URINALYSIS, ROUTINE W REFLEX MICROSCOPIC
Bacteria, UA: NONE SEEN
Bilirubin Urine: NEGATIVE
Glucose, UA: 150 mg/dL — AB
Ketones, ur: NEGATIVE mg/dL
Leukocytes,Ua: NEGATIVE
Nitrite: NEGATIVE
Protein, ur: 30 mg/dL — AB
Specific Gravity, Urine: 1.01 (ref 1.005–1.030)
pH: 5 (ref 5.0–8.0)

## 2021-04-20 LAB — GLUCOSE, CAPILLARY
Glucose-Capillary: 102 mg/dL — ABNORMAL HIGH (ref 70–99)
Glucose-Capillary: 144 mg/dL — ABNORMAL HIGH (ref 70–99)

## 2021-04-20 LAB — BASIC METABOLIC PANEL
Anion gap: 13 (ref 5–15)
BUN: 26 mg/dL — ABNORMAL HIGH (ref 8–23)
CO2: 20 mmol/L — ABNORMAL LOW (ref 22–32)
Calcium: 8.8 mg/dL — ABNORMAL LOW (ref 8.9–10.3)
Chloride: 103 mmol/L (ref 98–111)
Creatinine, Ser: 1.14 mg/dL — ABNORMAL HIGH (ref 0.44–1.00)
GFR, Estimated: 54 mL/min — ABNORMAL LOW (ref >60)
Glucose, Bld: 198 mg/dL — ABNORMAL HIGH (ref 70–99)
Potassium: 3.9 mmol/L (ref 3.5–5.1)
Sodium: 136 mmol/L (ref 135–145)

## 2021-04-20 LAB — TROPONIN I (HIGH SENSITIVITY)
Troponin I (High Sensitivity): 12 ng/L (ref <18)
Troponin I (High Sensitivity): 8 ng/L (ref <18)

## 2021-04-20 LAB — RESP PANEL BY RT-PCR (FLU A&B, COVID) ARPGX2
Influenza A by PCR: NEGATIVE
Influenza B by PCR: NEGATIVE
SARS Coronavirus 2 by RT PCR: NEGATIVE

## 2021-04-20 MED ORDER — ATORVASTATIN CALCIUM 40 MG PO TABS
40.0000 mg | ORAL_TABLET | Freq: Every day | ORAL | Status: DC
  Administered 2021-04-20 – 2021-04-22 (×3): 40 mg via ORAL
  Filled 2021-04-20 (×3): qty 1

## 2021-04-20 MED ORDER — SODIUM CHLORIDE 0.9 % IV BOLUS
500.0000 mL | Freq: Once | INTRAVENOUS | Status: AC
  Administered 2021-04-20: 500 mL via INTRAVENOUS

## 2021-04-20 MED ORDER — INSULIN ASPART 100 UNIT/ML IJ SOLN
0.0000 [IU] | Freq: Three times a day (TID) | INTRAMUSCULAR | Status: DC
  Administered 2021-04-21 (×3): 2 [IU] via SUBCUTANEOUS
  Administered 2021-04-22: 5 [IU] via SUBCUTANEOUS
  Administered 2021-04-22: 3 [IU] via SUBCUTANEOUS
  Administered 2021-04-22: 7 [IU] via SUBCUTANEOUS

## 2021-04-20 MED ORDER — LISINOPRIL 5 MG PO TABS
5.0000 mg | ORAL_TABLET | Freq: Every day | ORAL | Status: DC
  Administered 2021-04-20 – 2021-04-22 (×3): 5 mg via ORAL
  Filled 2021-04-20 (×3): qty 1

## 2021-04-20 MED ORDER — LACTATED RINGERS IV BOLUS
500.0000 mL | Freq: Once | INTRAVENOUS | Status: AC
  Administered 2021-04-20: 500 mL via INTRAVENOUS

## 2021-04-20 MED ORDER — EZETIMIBE 10 MG PO TABS
10.0000 mg | ORAL_TABLET | Freq: Every day | ORAL | Status: DC
  Administered 2021-04-20 – 2021-04-22 (×3): 10 mg via ORAL
  Filled 2021-04-20 (×3): qty 1

## 2021-04-20 MED ORDER — METOPROLOL SUCCINATE ER 100 MG PO TB24
100.0000 mg | ORAL_TABLET | Freq: Two times a day (BID) | ORAL | Status: DC
  Administered 2021-04-20 – 2021-04-22 (×4): 100 mg via ORAL
  Filled 2021-04-20 (×4): qty 1

## 2021-04-20 MED ORDER — INSULIN GLARGINE-YFGN 100 UNIT/ML ~~LOC~~ SOLN
10.0000 [IU] | Freq: Every day | SUBCUTANEOUS | Status: DC
  Administered 2021-04-21 – 2021-04-22 (×2): 10 [IU] via SUBCUTANEOUS
  Filled 2021-04-20 (×2): qty 0.1

## 2021-04-20 MED ORDER — APIXABAN 5 MG PO TABS
5.0000 mg | ORAL_TABLET | Freq: Two times a day (BID) | ORAL | Status: DC
  Administered 2021-04-20 – 2021-04-22 (×4): 5 mg via ORAL
  Filled 2021-04-20 (×4): qty 1

## 2021-04-20 NOTE — ED Provider Notes (Signed)
[facility] EMERGENCY DEPARTMENT Provider Note   CSN: [id number] Arrival date & time: 04/20/21  3151     History Chief Complaint  Patient presents with   Dizziness   Palpitations    Emily Farmer is a 65 y.o. female.   Dizziness Associated symptoms: palpitations   Palpitations Associated symptoms: dizziness   Associated symptoms: no back pain   Patient presents with dizziness and palpitations.  Began earlier today.  History of atrial fibrillation.  Appears to be chronic.  States started going fast however.  Feels little lightheaded.  Has been doing well otherwise.  No dysuria.  Although did have recent admission to the hospital and had bacteremia from presumed urinary tract infection.  No new urinary symptoms but states she does feel little weaker.  She is on anticoagulation.  Has been taking her medicines.    Past Medical History:  Diagnosis Date   Arthritis    Atrial fibrillation ([place])    a. 09/2016 in setting of pancreatitis;  b. 09/2016 Echo: EF 65-60%, no rwma, Gr2 DD, mild MR, triv TR, PASP 62mHg;  CHA2DS2VASc = 4-->Eliquis 518mBID. c. Recurred 06/2018 in setting of GIB.   Chronic diastolic CHF (congestive heart failure) (HCRaleigh8/15/2019   Diabetes mellitus    GI bleeding 06/2018   Gout    Hyperlipidemia    Hypertension    Hypertriglyceridemia    Pancreatitis    a. 09/2016 - Triglycerides 1,392 on admission.    Patient Active Problem List   Diagnosis Date Noted   Bacteremia 04/01/2021   Atrial fibrillation with RVR (HCCl[place]10/16/2022   Pulmonary hypertension (HCWis[place]10/09/2020   Hypomagnesemia    Sepsis (HCC[place]09/14/2022   Atrial fibrillation with rapid ventricular response (HCB[place]09/14/2022   Hypotension 02/27/2021   Secondary hypercoagulable state (HCAngie11/23/2021   Type 2 diabetes mellitus with diabetic polyneuropathy, with long-term current use of insulin (HCElizabetht[place]04/02/2020   Uncontrolled type 2 diabetes mellitus with hyperglycemia, with long-term  current use of insulin (HCBut[place]04/02/2020   Dyslipidemia 09/23/2019   Toenail fungus 07/19/2018   Depression 07/19/2018   Iron deficiency anemia due to chronic blood loss 07/19/2018   Gastritis and duodenitis 07/19/2018   Chronic a-fib (HCNellieburg08/15/2019   Hyponatremia 01/28/2018   Chronic diastolic CHF (congestive heart failure) (HCAgawam08/15/2019   Mixed hyperlipidemia 06/01/2017   Paroxysmal A-fib (HCVandemere04/21/2018   Hypokalemia 01/04/2014   Acute pyelonephritis 02/15/2013   Essential hypertension 02/15/2013   Hypertriglyceridemia 02/15/2013   Diabetes mellitus type 2 in obese (HCEast Dunse[place]09/07/2012   Coronary atherosclerosis seen on CT 02/15/2013    Past Surgical History:  Procedure Laterality Date   BIOPSY  06/20/2018   Procedure: BIOPSY;  Surgeon: MaClarene EssexMD;  [facility]: Endoscopy;;   CHOLECYSTECTOMY N/A 01/04/2014   Procedure: LAPAROSCOPIC CHOLECYSTECTOMY WITH INTRAOPERATIVE CHOLANGIOGRAM;  Surgeon: ArRalene OkMD;  [facility]: [facility] Service: General;  Laterality: N/A;   COLONOSCOPY WITH PROPOFOL N/A 06/21/2018   Procedure: COLONOSCOPY WITH PROPOFOL;  Surgeon: BuRonald LoboMD;  [facility] Service: Endoscopy;  Laterality: N/A;   ESOPHAGOGASTRODUODENOSCOPY (EGD) WITH PROPOFOL N/A 06/20/2018   Procedure: ESOPHAGOGASTRODUODENOSCOPY (EGD) WITH PROPOFOL;  Surgeon: MaClarene EssexMD;  [facility];  Laterality: N/A;   LUNG SURGERY       OB History   No obstetric history on file.     Family History  Problem Relation Age of Onset   Heart attack Mother    Heart  attack Father    Diabetes Sister    High blood pressure Neg Hx    High Cholesterol Neg Hx     Social History   Tobacco Use   Smoking status: Never   Smokeless tobacco: Never  Vaping Use   Vaping Use: Never used  Substance Use Topics   Alcohol use: No   Drug use: No    Home Medications Prior to Admission medications   Medication Sig Start Date  End Date Taking? Authorizing Provider  acetaminophen (TYLENOL) 325 MG tablet Take 2 tablets (650 mg total) by mouth every 6 (six) hours as needed for mild pain (or Fever >/= 101). 02/01/18  Yes Elgergawy, Silver Huguenin, MD  apixaban (ELIQUIS) 5 MG TABS tablet Take 1 tablet (5 mg total) by mouth 2 (two) times daily. 04/04/21 07/03/21 Yes Ladell Pier, MD  atorvastatin (LIPITOR) 40 MG tablet Take 1 tablet (40 mg total) by mouth daily. 03/21/21  Yes Loel Dubonnet, NP  Blood Glucose Monitoring Suppl (TRUE METRIX METER) w/Device KIT Use to monitor blood sugar as directed Patient taking differently: 1 each by Other route See admin instructions. Use to monitor blood sugar as directed 02/22/18  Yes Fulp, Cammie, MD  ezetimibe (ZETIA) 10 MG tablet Take 1 tablet (10 mg total) by mouth daily. To help lower lipids 03/21/21  Yes Loel Dubonnet, NP  ferrous sulfate 325 (65 FE) MG tablet Take 1 tablet (325 mg total) by mouth daily with breakfast. 04/11/21 04/11/22 Yes McClung, Dionne Bucy, PA-C  glucose blood test strip USE AS INSTRUCTED TO CHECK BLOOD SUGARS 3 TIMES DAILY Patient taking differently: 1 each by Other route See admin instructions. Check blood sugar 4 times daily 03/27/20  Yes Fulp, Cammie, MD  Insulin Glargine (BASAGLAR KWIKPEN) 100 UNIT/ML Inject 27 Units into the skin at bedtime. Patient taking differently: Inject 27 Units into the skin every evening. 03/19/21  Yes Ladell Pier, MD  insulin lispro (HUMALOG) 100 UNIT/ML injection Inject 10 Units into the skin in the morning.   Yes [provider]  Insulin Syringe-Needle U-100 (TRUEPLUS INSULIN SYRINGE) 31G X 5/16" 0.3 ML MISC Use to inject insulin daily. Patient taking differently: 1 each by Other route See admin instructions. Use to inject insulin daily. 05/06/18  Yes Fulp, Cammie, MD  liraglutide (VICTOZA) 18 MG/3ML SOPN Inject 1.2 mg into the skin daily. 03/19/21  Yes Ladell Pier, MD  lisinopril (ZESTRIL) 5 MG tablet Take 1  tablet (5 mg total) by mouth daily. 03/19/21  Yes Ladell Pier, MD  metFORMIN (GLUCOPHAGE) 500 MG tablet Take 0.5 tablets (250 mg total) by mouth 4 (four) times daily. 09/27/19  Yes Ladell Pier, MD  metoprolol succinate (TOPROL-XL) 100 MG 24 hr tablet Take 1 tablet (100 mg total) by mouth 2 (two) times daily. Take with or immediately following a meal. 03/21/21  Yes Loel Dubonnet, NP  Multiple Vitamins-Minerals (CENTRUM SILVER PO) Take 1 tablet by mouth daily.   Yes [provider]    Allergies    Patient has no known allergies.  Review of Systems   Review of Systems  Constitutional:  Negative for appetite change and fatigue.  HENT:  Negative for congestion.   Cardiovascular:  Positive for palpitations.  Gastrointestinal:  Negative for abdominal pain.  Genitourinary:  Negative for dysuria.  Musculoskeletal:  Negative for back pain.  Skin:  Negative for wound.  Neurological:  Positive for dizziness.  Psychiatric/Behavioral:  Negative for confusion.    Physical  Exam Updated Vital Signs BP (!) 157/113   Pulse (!) 110   Temp 97.8 F (36.6 C) (Oral)   Resp 19   Ht '5\' 3"'  (1.6 m)   Wt 75.8 kg   SpO2 99%   BMI 29.60 kg/m   Physical Exam Vitals and nursing note reviewed.  HENT:     Head: Normocephalic.  Eyes:     Pupils: Pupils are equal, round, and reactive to light.  Cardiovascular:     Rate and Rhythm: Tachycardia present.  Pulmonary:     Breath sounds: No wheezing or rhonchi.  Abdominal:     Tenderness: There is no abdominal tenderness.  Musculoskeletal:        General: No tenderness.     Cervical back: Neck supple.  Skin:    General: Skin is warm.     Capillary Refill: Capillary refill takes less than 2 seconds.  Neurological:     Mental Status: She is alert and oriented to person, place, and time.    ED Results / Procedures / Treatments   Labs (all labs ordered are listed, but only abnormal results are displayed) Labs Reviewed  CBC WITH  DIFFERENTIAL/PLATELET - Abnormal; Notable for the following components:      Result Value   Hemoglobin 11.8 (*)    All other components within normal limits  BASIC METABOLIC PANEL - Abnormal; Notable for the following components:   CO2 20 (*)    Glucose, Bld 198 (*)    BUN 26 (*)    Creatinine, Ser 1.14 (*)    Calcium 8.8 (*)    GFR, Estimated 54 (*)    All other components within normal limits  CULTURE, BLOOD (ROUTINE X 2)  CULTURE, BLOOD (ROUTINE X 2)  URINALYSIS, ROUTINE W REFLEX MICROSCOPIC  TROPONIN I (HIGH SENSITIVITY)  TROPONIN I (HIGH SENSITIVITY)    EKG EKG Interpretation  Date/Time:  Saturday April 20 2021 09:17:39 EDT Ventricular Rate:  137 PR Interval:    QRS Duration: 74 QT Interval:  326 QTC Calculation: 463 R Axis:   20 Text Interpretation: Atrial fibrillation with rvr Probable anteroseptal infarct, old Confirmed by Davonna Belling [phone]) on 04/20/2021 9:42:50 AM  Radiology DG Chest Portable 1 View  Result Date: 04/20/2021 CLINICAL DATA:  Short of breath EXAM: PORTABLE CHEST 1 VIEW COMPARISON:  03/31/2021 FINDINGS: The heart size and mediastinal contours are within normal limits. Both lungs are clear. The visualized skeletal structures are unremarkable. Surgical clips overriding the right lower lobe. IMPRESSION: No active disease. Electronically Signed   By: Franchot Gallo M.D.   On: 04/20/2021 10:18    Procedures Procedures   Medications Ordered in ED Medications  lactated ringers bolus 500 mL (has no administration in time range)  sodium chloride 0.9 % bolus 500 mL (0 mLs Intravenous Stopped 04/20/21 1258)    ED Course  I have reviewed the triage vital signs and the nursing notes.  Pertinent labs & imaging results that were available during my care of the patient were reviewed by me and considered in my medical decision making (see chart for details).    MDM Rules/Calculators/A&P                           Patient presents for fast heart  rate.  Began earlier today.  History of A. fib.  Reviewing records appears if she is in a permanent A. fib.  Normally rate controlled however.  Now tachycardic.  CHA2DS2-VASc score  3.  On anticoagulation.  Lab work is overall reassuring.  Troponin negative.  Creatinine mildly above baseline.  Hemoglobin at baseline.  Mild improvement in heart rate with initial fluid bolus.  However continued tachycardia.  Will discuss with family medicine as a bounce back for admission.  Urinalysis still pending.  Had recent bacteremia and UTI.  However patient denies any urinary symptoms.  Will admit to hospital Final Clinical Impression(s) / ED Diagnoses Final diagnoses:  Atrial fibrillation with rapid ventricular response [facility])    Rx / DC Orders ED Discharge Orders     None        Davonna Belling, MD 04/20/21 1442

## 2021-04-20 NOTE — H&P (Signed)
[facility] Admission History and Physical Service Pager: [phone]  Patient name: Emily Farmer Medical record number: [id number] Date of birth: October 06, 1955 Age: 65 y.o. Gender: female  Primary Care Provider: Ladell Pier, MD Consultants: None Code Status: Full  Preferred Emergency Contact: Emily Farmer (Sister)- [phone]  Chief Complaint: Dizziness  Assessment and Plan: Emily Farmer is a 65 y.o. female presenting with dizziness who was found to be in a-fib with RVR. PMH is significant for paroxysmal a-fib, HTN, HFpEF, T2DM, HLD, iron deficiency anemia, and recent Klebsiella bacteremia 2/2 pyelonephritis.  A-Fib with RVR Patient presented with dizziness since this morning. In the ED, was found to be in a-fib with RVR. Remainder of vitals stable. Workup including CBC, BMP, UA, CXR were all unremarkable. Troponin negative x2 (12>8).  Precipitating factor unclear but suspect secondary to patient not taking her metoprolol this morning. No concern for infection at this time (afebrile, normal WBC). No chest pain, negative troponin so no concern for ACS/MI. Received 500cc fluid bolus x2 in the ED with slight improvement in her HR. -Admit to med-tele, attending Dr. Gwendlyn Deutscher -Obtain repeat EKG -Start home Metoprolol succinate 188m BID -Continue home Eliquis 560mBID -If persistently in RVR, consider recheck electrolytes -Goal K>4, Mg >2 -Check TSH -Consider cardiology consult and/or dilt gtt if not improving -f/u blood cx  HTN BP elevated to [id number] in the ED. Again, patient had not taken her home meds today (lisinopril 37m48maily) -Restart home lisinopril 37mg17mily -Monitor BP  Poorly Controlled T2DM Most recent A1c 12.8 on 02/27/21. Home meds: glargine 27u at bedtime, metformin 250mg337m, victoza daily. -CBG monitoring qAC and qHS -Semglee 10u tomorrow am -sSSI -Holding home Metformin and Victoza  HFpEF EF 55-60% in September. Appears  euvolemic on exam -Monitor volume status -Not on diuretics at home  HLD Home meds: atorvastatin 40mg 50my, zetia 10mg d46m. Last lipid panel 05/2020 (LDL 110, total cholesterol 214, Tgs 372, HDL 40) -Obtain lipid panel -Continue home atorvastatin and Zetia  Iron Deficiency Anemia Hgb 11.8 on admission, which is at her baseline. -Monitor  Hx of Klebsiella Bacteremia 2/2 pyelo Resolved. Completed course of IV Ceftriaxone on 10/25. UA unremarkable on admission.  FEN/GI: Carb modified Prophylaxis: Lovenox  Disposition: med-tele  History of Present Illness:  [name]5 y.o.56emale presenting with dizziness.   Patient reports she started to feel dizzy around 8 am this morning while walking to work. Also felt fatigued. No significant chest pain or shortness of breath. Did have one episode of jabbing left sided chest pain in the ED which resolved after a few seconds. Denies palpitations, no syncope. No recent fever, chills, urinary symptoms, or GI symptoms.  Lives with sister at home. Did not take meds this morning because was waiting to eat at work. Took all her medications last night. Has appointment with cardiologist Dr. Walker Gilford Farmer.   Review Of Systems: Per HPI with the following additions:  Review of Systems  Constitutional:  Negative for chills and fever.  Respiratory:  Negative for shortness of breath.   Cardiovascular:  Positive for chest pain. Negative for palpitations.  Gastrointestinal:  Negative for abdominal pain, nausea and vomiting.  Genitourinary:  Negative for dysuria.  Neurological:  Positive for dizziness. Negative for syncope and light-headedness.    Patient Active Problem List   Diagnosis Date Noted   Bacteremia 04/01/2021   Atrial fibrillation with RVR (HCC) 10[place]/2022   Pulmonary hypertension (HCC) 10[place]/2022   Hypomagnesemia  Sepsis ([place]) 02/27/2021   Atrial fibrillation with rapid ventricular response ([place]) 02/27/2021   Hypotension  02/27/2021   Secondary hypercoagulable state ([place]) 05/08/2020   Type 2 diabetes mellitus with diabetic polyneuropathy, with long-term current use of insulin ([place]) 09/23/2019   Uncontrolled type 2 diabetes mellitus with hyperglycemia, with long-term current use of insulin ([place]) 09/23/2019   Dyslipidemia 09/23/2019   Toenail fungus 07/19/2018   Depression 07/19/2018   Iron deficiency anemia due to chronic blood loss 07/19/2018   Gastritis and duodenitis 07/19/2018   Chronic a-fib ([place]) 01/28/2018   Hyponatremia 01/28/2018   Chronic diastolic CHF (congestive heart failure) ([place]) 01/28/2018   Mixed hyperlipidemia 06/01/2017   Paroxysmal A-fib ([place]) 10/04/2016   Hypokalemia 01/04/2014   Acute pyelonephritis 02/15/2013   Essential hypertension 02/15/2013   Hypertriglyceridemia 02/15/2013   Diabetes mellitus type 2 in obese ([place]) 02/15/2013   Coronary atherosclerosis seen on CT 02/15/2013    Past Medical History: Past Medical History:  Diagnosis Date   Arthritis    Atrial fibrillation ([place])    a. 09/2016 in setting of pancreatitis;  b. 09/2016 Echo: EF 65-60%, no rwma, Gr2 DD, mild MR, triv TR, PASP 37mHg;  CHA2DS2VASc = 4-->Eliquis 557mBID. c. Recurred 06/2018 in setting of GIB.   Chronic diastolic CHF (congestive heart failure) ([place]/15/2019   Diabetes mellitus    GI bleeding 06/2018   Gout    Hyperlipidemia    Hypertension    Hypertriglyceridemia    Pancreatitis    a. 09/2016 - Triglycerides 1,392 on admission.    Past Surgical History: Past Surgical History:  Procedure Laterality Date   BIOPSY  06/20/2018   Procedure: BIOPSY;  Surgeon: MaClarene EssexMD;  [place] [facility] [place];;   CHOLECYSTECTOMY N/A 01/04/2014   Procedure: LAPAROSCOPIC CHOLECYSTECTOMY WITH INTRAOPERATIVE CHOLANGIOGRAM;  Surgeon: ArRalene OkMD;  [place] Service: General;  Laterality: N/A;   COLONOSCOPY WITH PROPOFOL N/A 06/21/2018   Procedure: COLONOSCOPY WITH PROPOFOL;   Surgeon: BuRonald LoboMD;  [place]: Endoscopy;  Laterality: N/A;   ESOPHAGOGASTRODUODENOSCOPY (EGD) WITH PROPOFOL N/A 06/20/2018   Procedure: ESOPHAGOGASTRODUODENOSCOPY (EGD) WITH PROPOFOL;  Surgeon: MaClarene EssexMD;  [place] Service: Endoscopy;  Laterality: N/A;   LUNG SURGERY      Social History: Social History   Tobacco Use   Smoking status: Never   Smokeless tobacco: Never  Vaping Use   Vaping Use: Never used  Substance Use Topics   Alcohol use: No   Drug use: No    Family History: Family History  Problem Relation Age of Onset   Heart attack Mother    Heart attack Father    Diabetes Sister    High blood pressure Neg Hx    High Cholesterol Neg Hx     Allergies and Medications: No Known Allergies No current facility-administered medications on file prior to encounter.   Current Outpatient Medications on File Prior to Encounter  Medication Sig Dispense Refill   acetaminophen (TYLENOL) 325 MG tablet Take 2 tablets (650 mg total) by mouth every 6 (six) hours as needed for mild pain (or Fever >/= 101).     apixaban (ELIQUIS) 5 MG TABS tablet Take 1 tablet (5 mg total) by mouth 2 (two) times daily. 60 tablet 4   atorvastatin (LIPITOR) 40 MG tablet Take 1 tablet (40 mg total) by mouth daily. 90 tablet 3   Blood Glucose Monitoring Suppl (TRUE METRIX METER) w/Device KIT Use to monitor blood  sugar as directed (Patient taking differently: 1 each by Other route See admin instructions. Use to monitor blood sugar as directed) 1 kit 0   ezetimibe (ZETIA) 10 MG tablet Take 1 tablet (10 mg total) by mouth daily. To help lower lipids 90 tablet 3   ferrous sulfate 325 (65 FE) MG tablet Take 1 tablet (325 mg total) by mouth daily with breakfast. 30 tablet 4   glucose blood test strip USE AS INSTRUCTED TO CHECK BLOOD SUGARS 3 TIMES DAILY (Patient taking differently: 1 each by Other route See admin instructions. Check blood sugar 4 times daily) 100  each 11   Insulin Glargine (BASAGLAR KWIKPEN) 100 UNIT/ML Inject 27 Units into the skin at bedtime. (Patient taking differently: Inject 27 Units into the skin every evening.) 30 mL 6   insulin lispro (HUMALOG) 100 UNIT/ML injection Inject 10 Units into the skin in the morning.     Insulin Syringe-Needle U-100 (TRUEPLUS INSULIN SYRINGE) 31G X 5/16" 0.3 ML MISC Use to inject insulin daily. (Patient taking differently: 1 each by Other route See admin instructions. Use to inject insulin daily.) 100 each 11   liraglutide (VICTOZA) 18 MG/3ML SOPN Inject 1.2 mg into the skin daily. 6 mL 6   lisinopril (ZESTRIL) 5 MG tablet Take 1 tablet (5 mg total) by mouth daily. 90 tablet 3   metFORMIN (GLUCOPHAGE) 500 MG tablet Take 0.5 tablets (250 mg total) by mouth 4 (four) times daily. 60 tablet 2   metoprolol succinate (TOPROL-XL) 100 MG 24 hr tablet Take 1 tablet (100 mg total) by mouth 2 (two) times daily. Take with or immediately following a meal. 180 tablet 2   Multiple Vitamins-Minerals (CENTRUM SILVER PO) Take 1 tablet by mouth daily.      Objective: BP (!) 161/112   Pulse (!) 112   Temp 97.8 F (36.6 C) (Oral)   Resp 18   Ht _0  (1.6 m)   Wt 75.8 kg   SpO2 100%   BMI 29.60 kg/m  Exam: General: alert, resting comfortably, NAD Eyes: PERRL, normal conjunctiva ENTM: moist mucous membranes, oropharynx clear Neck: supple Cardiovascular: irregularly irregular rhythm, tachycardic Respiratory: normal effort, lungs CTAB Gastrointestinal: soft, nontender, nondistended Ext: no peripheral edema Derm: no rashes Neuro: oriented x3, grossly intact Psych: appropriate mood and affect  Labs and Imaging: CBC BMET  Recent Labs  Lab 04/20/21 0928  WBC 7.3  HGB 11.8*  HCT 36.6  PLT 253   Recent Labs  Lab 04/20/21 0928  NA 136  K 3.9  CL 103  CO2 20*  BUN 26*  CREATININE 1.14*  GLUCOSE 198*  CALCIUM 8.8*     EKG: a-fib with RVR at 137bpm  DG Chest Portable 1 View  Result Date:  04/20/2021 CLINICAL DATA:  Short of breath EXAM: PORTABLE CHEST 1 VIEW COMPARISON:  03/31/2021 FINDINGS: The heart size and mediastinal contours are within normal limits. Both lungs are clear. The visualized skeletal structures are unremarkable. Surgical clips overriding the right lower lobe. IMPRESSION: No active disease. Electronically Signed   By: Franchot Gallo M.D.   On: 04/20/2021 10:18     Alcus Dad, MD 04/20/2021, 3:30 PM PGY-2, [place] Intern pager: [phone], text pages welcome

## 2021-04-20 NOTE — Progress Notes (Addendum)
Family Medicine Teaching Service Daily Progress Note Intern Pager: [phone]  Patient name: Emily Farmer Medical record number: [id number] Date of birth: 1956/02/08 Age: 65 y.o. Gender: female  Primary Care Provider: Marcine Matar, MD Consultants: None Code Status: Full  Pt Overview and Major Events to Date:  11/5-Admitted  Assessment and Plan: Emily Farmer is a 65 y.o. female presenting with dizziness who was found to be in a-fib with RVR. PMH is significant for paroxysmal a-fib, HTN, HFpEF, T2DM, HLD, iron deficiency anemia, and recent Klebsiella bacteremia 2/2 pyelonephritis.   A-Fib with RVR HR 116 this morning. AM EKG pending. Most recent EKG w/ afib w/ RVR. TSH wnl. Mg 1.3. K 4. No longer endorsing dizziness.  -AM EKG pending -Continue home Metoprolol succinate 100mg  BID -Continue home Eliquis 5mg  BID -If persistently in RVR, consider recheck electrolytes -Goal K>4, Mg >2, replete as necessary -Consider cardiology consult and/or dilt gtt if not improving -f/u blood cx   HTN BP 128/87 this morning.  -Continue home lisinopril 5mg  daily -Monitor BP   Poorly Controlled T2DM Glucose on BMP is 127 this morning -CBG monitoring qAC and qHS -Semglee 10u today -sSSI -Holding home Metformin and Victoza   HFpEF EF 55-60% in September. Appears euvolemic on exam -Monitor volume status   HLD Home meds: atorvastatin 40mg  daily, zetia 10mg  daily. Lipid panel here compared with last (LDL 110>46, total cholesterol 214>132, Tgs 372>263, HDL 40>33) -Continue home atorvastatin and Zetia   Iron Deficiency Anemia Hgb 11.8 on admission, which is at baseline. Likely not contributing to tachycardia -Monitor   FEN/GI: Carb modified Prophylaxis: Lovenox Dispo:Home pending clinical improvement .   Subjective:  Doing well no concerns at this time.   Objective: Temp:  [97.8 F (36.6 C)-98.4 F (36.9 C)] 98.4 F (36.9 C) (11/05 2044) Pulse Rate:  [39-128] 112 (11/05  2044) Resp:  [14-34] 19 (11/05 2044) BP: (105-186)/(66-148) 126/79 (11/05 2044) SpO2:  [89 %-100 %] 93 % (11/05 2044) Weight:  [75.8 kg] 75.8 kg (11/05 0922) Physical Exam: General: Appears well, lying in bed resting comfortably. No acute distress Cardiovascular: Irregularly irregular rhythm. No murmurs.  Respiratory: CTAB, normal effort Extremities: No LE edema   Laboratory: Recent Labs  Lab 04/20/21 0928  WBC 7.3  HGB 11.8*  HCT 36.6  PLT 253   Recent Labs  Lab 04/20/21 0928 04/21/21 0143  NA 136 137  K 3.9 4.0  CL 103 103  CO2 20* 24  BUN 26* 23  CREATININE 1.14* 0.85  CALCIUM 8.8* 8.9  GLUCOSE 198* 127*       Autry-Lott, Emily Kastens, DO 04/20/2021, 5:52 AM PGY-3, [place] Intern pager: [phone], text pages welcome

## 2021-04-20 NOTE — Plan of Care (Signed)
  Problem: Education: Goal: Knowledge of General Education information will improve Description: Including pain rating scale, medication(s)/side effects and non-pharmacologic comfort measures Outcome: Progressing   Problem: Activity: Goal: Risk for activity intolerance will decrease Outcome: Progressing   

## 2021-04-20 NOTE — ED Notes (Signed)
Got patient undressed on the monitor did ekg shown to Dr Rubin Payor got patient some warm blankets patinet is resting with call bell in reach

## 2021-04-20 NOTE — ED Triage Notes (Signed)
Pt arrived by EMS from work complaining of shortness of breath, heart racing and dizziness. Pt was complaining of 10/10 chest pressure  Pt has chronic neck pain that is a 3/10 on arrival  Hx of A.fib   EMS gave 1nitro and 325 ASA. BP dropped after nitro but returned to 95/53 before arrival

## 2021-04-20 NOTE — ED Notes (Signed)
Pt ambulatory to restroom. Denies feeling lightheaded, denies seeing bright lights which is what happened earlier today  No chest pain  Even steady gait.

## 2021-04-20 NOTE — ED Notes (Signed)
Pt complaining of sudden onset of pain in her left arm pit. Denies pain radiating pain gets worse with palpation.   Denies feeling short of breath or N/V  Provider aware

## 2021-04-20 NOTE — ED Notes (Signed)
Got patient undressed on the monitor did ekg shown to Dr Rubin Payor patient is resting with call bell in reach got patient a warm blanket

## 2021-04-20 NOTE — Progress Notes (Signed)
   04/20/21 1642  Assess: MEWS Score  Temp 98.3 F (36.8 C)  BP (!) 172/105  Pulse Rate (!) 118  ECG Heart Rate (!) 118  Resp 20  Level of Consciousness Alert  SpO2 99 %  O2 Device Room Air  Assess: MEWS Score  MEWS Temp 0  MEWS Systolic 0  MEWS Pulse 2  MEWS RR 0  MEWS LOC 0  MEWS Score 2  MEWS Score Color Yellow  Assess: if the MEWS score is Yellow or Red  Were vital signs taken at a resting state? Yes  Focused Assessment No change from prior assessment  Early Detection of Sepsis Score *See Row Information* Low  MEWS guidelines implemented *See Row Information* Yes  Take Vital Signs  Increase Vital Sign Frequency  Yellow: Q 2hr X 2 then Q 4hr X 2, if remains yellow, continue Q 4hrs  Escalate  MEWS: Escalate Yellow: discuss with charge nurse/RN and consider discussing with provider and RRT  Notify: Charge Nurse/RN  Name of Charge Nurse/RN Notified Minna Antis, RNN  Date Charge Nurse/RN Notified 04/20/21  Time Charge Nurse/RN Notified 1647  Document  Patient Outcome Other (Comment) (Satablizing and remains on depart.)  Progress note created (see row info) Yes

## 2021-04-21 LAB — BASIC METABOLIC PANEL
Anion gap: 10 (ref 5–15)
BUN: 23 mg/dL (ref 8–23)
CO2: 24 mmol/L (ref 22–32)
Calcium: 8.9 mg/dL (ref 8.9–10.3)
Chloride: 103 mmol/L (ref 98–111)
Creatinine, Ser: 0.85 mg/dL (ref 0.44–1.00)
GFR, Estimated: >60 mL/min (ref >60)
Glucose, Bld: 127 mg/dL — ABNORMAL HIGH (ref 70–99)
Potassium: 4 mmol/L (ref 3.5–5.1)
Sodium: 137 mmol/L (ref 135–145)

## 2021-04-21 LAB — MAGNESIUM: Magnesium: 1.3 mg/dL — ABNORMAL LOW (ref 1.7–2.4)

## 2021-04-21 LAB — LIPID PANEL
Cholesterol: 132 mg/dL (ref 0–200)
HDL: 33 mg/dL — ABNORMAL LOW (ref >40)
LDL Cholesterol: 46 mg/dL (ref 0–99)
Total CHOL/HDL Ratio: 4 RATIO
Triglycerides: 263 mg/dL — ABNORMAL HIGH (ref <150)
VLDL: 53 mg/dL — ABNORMAL HIGH (ref 0–40)

## 2021-04-21 LAB — TSH: TSH: 0.633 u[IU]/mL (ref 0.350–4.500)

## 2021-04-21 LAB — GLUCOSE, CAPILLARY
Glucose-Capillary: 197 mg/dL — ABNORMAL HIGH (ref 70–99)
Glucose-Capillary: 172 mg/dL — ABNORMAL HIGH (ref 70–99)
Glucose-Capillary: 191 mg/dL — ABNORMAL HIGH (ref 70–99)
Glucose-Capillary: 217 mg/dL — ABNORMAL HIGH (ref 70–99)

## 2021-04-21 MED ORDER — DILTIAZEM HCL 60 MG PO TABS
30.0000 mg | ORAL_TABLET | Freq: Four times a day (QID) | ORAL | Status: DC
  Administered 2021-04-21 – 2021-04-22 (×4): 30 mg via ORAL
  Filled 2021-04-21 (×4): qty 1

## 2021-04-21 MED ORDER — ACETAMINOPHEN 325 MG PO TABS
650.0000 mg | ORAL_TABLET | Freq: Four times a day (QID) | ORAL | Status: DC | PRN
  Administered 2021-04-21: 650 mg via ORAL
  Filled 2021-04-21: qty 2

## 2021-04-21 MED ORDER — MAGNESIUM SULFATE 2 GM/50ML IV SOLN
2.0000 g | Freq: Once | INTRAVENOUS | Status: AC
Start: 2021-04-21 — End: 2021-04-21
  Administered 2021-04-21: 2 g via INTRAVENOUS
  Filled 2021-04-21: qty 50

## 2021-04-21 NOTE — Discharge Summary (Addendum)
[facility] Discharge Summary  Patient name: Emily Farmer Medical record number: [id number] Date of birth: 03-01-1956 Age: 65 y.o. Gender: female Date of Admission: 04/20/2021  Date of Discharge: 04/22/2021 Admitting Physician: Kinnie Feil, MD  Primary Care Provider: Ladell Pier, MD Consultants: Cardiology  Indication for Hospitalization: A fib RVR  Discharge Diagnoses/Problem List:  A fib with RVR  Disposition: Home  Discharge Condition: Stable  Discharge Exam:  General: Well appearing, NAD, awake, alert, responsive to questions Head: Normocephalic atraumatic CV: A fib, regular rate, no murmurs rubs or gallops, 2+ pulses bilaterally Respiratory: Clear to ausculation bilaterally, no wheezes rales or crackles, chest rises symmetrically,  no increased work of breathing Abdomen: Soft, non-tender, non-distended, normoactive bowel sounds  Extremities: Moves upper and lower extremities freely, no edema in LE Neuro: No focal deficits Skin: No rashes or lesions visualized   Brief Hospital Course:  Emily Farmer is a 65 y.o. female who presented with dizziness who was found to be in a-fib with RVR. PMH is significant for paroxysmal a-fib, HTN, HFpEF, T2DM, HLD, iron deficiency anemia, and recent Klebsiella bacteremia 2/2 pyelonephritis. Below is the brief hospital course    A-Fib with RVR  dizziness Patient presented with dizziness since this morning. In the ED, was found to be in a-fib with RVR. Remainder of vitals stable. Workup including CBC, BMP, UA, CXR were all unremarkable. Troponin negative x2 (12>8). Precipitating factor unclear but suspect secondary to patient not taking her metoprolol this morning. No concern for infection at this time (afebrile, normal WBC). No chest pain, negative troponin so no concern for ACS/MI. Received 500cc fluid bolus x2 in the ED with slight improvement in her HR. Magnesium dropped below 2, was repleted and at end  of hospitalization was 2.1. Patient had dizziness when standing up which improved greatly at end of hospitalization  HTN BP elevated to 151V-616W systolic in the ED. Again, patient had not taken her home meds, home lisinopril 63m was restarted and BP ranges were in the [id number]. -Monitor BP   Poorly Controlled T2DM Most recent A1c 12.8 on 02/27/21. Home meds: glargine 27u at bedtime, metformin 2536mq6h, victoza daily. Was started on 10 units basal insulin and sensitive sliding scale.  At end of hospitalization glucose ranges in 200s. Will discharge on home regimen given elevate glucoses and include parameters in patient instructions.   HFpEF EF 55-60% in September. Appears euvolemic on exams  HLD Home meds: atorvastatin 4020maily, zetia 71m59mily.Lipid panel was obtained which showed LDL 46, elevated TG 263, total cholesterol 132. Continued home atorvastatin and Zetia   Iron Deficiency Anemia Hgb 11.8 on admission, which is at her baseline.   Hx of Klebsiella Bacteremia 2/2 pyelo Resolved. Completed course of IV Ceftriaxone on 10/25. UA unremarkable on admission.  Issues for Follow Up:  Poorly controlled diabetes we did 10 units basal inpatient and educated patient on hypoglycemia/checking CBGs before insulin administration monitor if patient is able to adhere given A1c 12.8.  Counseled patient on taking time to stand up before standing up completely, consider reassessing orthostatic dizziness  Significant Procedures:   Significant Labs and Imaging:  Recent Labs  Lab 04/20/21 0928  WBC 7.3  HGB 11.8*  HCT 36.6  PLT 253   Recent Labs  Lab 04/20/21 0928 04/21/21 0143 04/22/21 0341 04/22/21 1125  NA 136 137 135  --   K 3.9 4.0 4.1  --   CL 103 103 100  --  CO2 20* 24 26  --   GLUCOSE 198* 127* 245*  --   BUN 26* 23 21  --   CREATININE 1.14* 0.85 1.21*  --   CALCIUM 8.8* 8.9 9.4  --   MG  --  1.3* 1.5* 2.1      Results/Tests Pending at Time of  Discharge:   Discharge Medications:  Allergies as of 04/22/2021   No Known Allergies      Medication List     TAKE these medications    acetaminophen 325 MG tablet Commonly known as: TYLENOL Take 2 tablets (650 mg total) by mouth every 6 (six) hours as needed for mild pain (or Fever >/= 101).   atorvastatin 40 MG tablet Commonly known as: LIPITOR Take 1 tablet (40 mg total) by mouth daily.   Basaglar KwikPen 100 UNIT/ML Inject 27 Units into the skin at bedtime. What changed: when to take this   CENTRUM SILVER PO Take 1 tablet by mouth daily.   diltiazem 120 MG 24 hr capsule Commonly known as: CARDIZEM CD Take 1 capsule (120 mg total) by mouth daily.   Eliquis 5 MG Tabs tablet Generic drug: apixaban Take 1 tablet (5 mg total) by mouth 2 (two) times daily.   ezetimibe 10 MG tablet Commonly known as: Zetia Take 1 tablet (10 mg total) by mouth daily. Start taking on: April 23, 2021 What changed: additional instructions   FeroSul 325 (65 FE) MG tablet Generic drug: ferrous sulfate Take 1 tablet (325 mg total) by mouth daily with breakfast.   glucose blood test strip USE AS INSTRUCTED TO CHECK BLOOD SUGARS 3 TIMES DAILY What changed:  how much to take how to take this when to take this additional instructions   insulin lispro 100 UNIT/ML injection Commonly known as: HUMALOG Inject 10 Units into the skin in the morning.   Insulin Syringe-Needle U-100 31G X 5/16" 0.3 ML Misc Commonly known as: TRUEplus Insulin Syringe Use to inject insulin daily. What changed:  how much to take how to take this when to take this   lisinopril 5 MG tablet Commonly known as: ZESTRIL Take 1 tablet (5 mg total) by mouth daily.   metFORMIN 500 MG tablet Commonly known as: GLUCOPHAGE Take 0.5 tablets (250 mg total) by mouth 4 (four) times daily.   metoprolol succinate 100 MG 24 hr tablet Commonly known as: TOPROL-XL Take 1 tablet (100 mg total) by mouth 2 (two) times  daily. Take with or immediately following a meal.   True Metrix Meter w/Device Kit Use to monitor blood sugar as directed What changed:  how much to take how to take this when to take this   Victoza 18 MG/3ML Sopn Generic drug: liraglutide Inject 1.2 mg into the skin daily.        Discharge Instructions: Please refer to Patient Instructions section of EMR for full details.  Patient was counseled important signs and symptoms that should prompt return to medical care, changes in medications, dietary instructions, activity restrictions, and follow up appointments.   Follow-Up Appointments:  Follow-up Information     Ladell Pier, MD. Schedule an appointment as soon as possible for a visit.   Specialty: Internal Medicine Why: Please make an appointment at your earliest convenience for a hospital follow up. Contact information: [place] [address] [phone]         Pixie Casino, MD .   Specialty: Cardiology Contact information: [place]  [place]                 Gerrit Heck, MD 04/22/2021, 2:51 PM PGY-1, [place] [facility]

## 2021-04-21 NOTE — Consult Note (Signed)
Cardiology Consultation:   Patient ID: Emily Farmer MRN: [id number]; DOB: 04/23/1956  Admit date: 04/20/2021 Date of Consult: 04/21/2021  PCP:  Ladell Pier, MD   [facility] Cardiologist:  Pixie Casino, MD   Patient Profile:   Emily Farmer is a 65 y.o. female with a history of persistent atrial fibrillation on Eliquis, chronic diastolic CHF, obstructive sleep apnea on BiPAP, moderate to severe TR, mild MR, hyperlipidemia, poorly controlled type 2 diabetes, pancreatitis, and anemia who is being seen today for evaluation of atrial fibrillation with RVR at the request of Dr. Gwendlyn Deutscher.  History of Present Illness:   Emily Farmer is a 65 year old female with the above history who is followed by Dr. Debara Pickett.  Patient was initially diagnosed with atrial fibrillation in 09/2016 in the setting of pancreatitis.  She was started on Metoprolol and Eliquis at that time.  She has had recurrence of atrial fibrillation since then.  At some point she was started on Amiodarone but she developed bradycardia with this so this was stopped.  Patient also has chronic dyspnea with known diastolic CHF.  Patient was admitted in 02/2021 with sepsis secondary to pyelonephritis.  She was treated with fluids and antibiotics.  Was noted to have uncontrolled atrial fibrillation during this admission and her beta-blocker was at dose adjusted.  Code during this admission showed LVEF of 55 to 60% with normal wall motion.  RV was mildly enlarged with mildly reduced systolic function and severe pulmonary hypertension with RVSP of 60.1 mmHg.  Also showed moderate to severe TR and mild MR.  The RV dysfunction and severely elevated PASP were new compared to prior Echo in 03/2020.  Repeat echo, PFTs, and +/- RHC were recommended as an outpatient.  She was readmitted in 03/2021 Klebsiella bacteremia and severe sepsis.  She was again found to have uncontrolled atrial fibrillation during mission with heart rates as high as  the 180s.  Beta-blocker was uptitrated and she was ultimately discharged on Toprol 100 mg twice daily.  Repeat limited echo this admission showed LVEF of 50 to 55%, mild to moderate TR, and mild MR. RV was still mildly enlarged with mildly reduced systolic function but PASP was normal with no evidence of pulmonary hypertension.  She was readmitted on 04/20/2021 with atrial fibrillation with RVR after presenting to the ED with dizziness.  Upon arrival to the ED, patient was tachycardia and tachypneic with O2 sats of 89%. EKG showed atrial fibrillation, rate 137 bpm, with no acute ST/T changes. High-sensitivity troponin negative x2. Chest x-ray showed no acute findings. WBC 7.3 , Hgb 11.8, Plts 253. Na 136, K 3.9, Glucose 198, BUN 26, Cr 1.14. Urinalysis showed glucose 150, small Hgb, and protein 30 but no nitrite, leukocytes, or bacteria. Blood cultures pending. Patient was admitted for atrial fibrillation with RVR and Cardiology was consulted.    Past Medical History:  Diagnosis Date   Arthritis    Atrial fibrillation ([place])    a. 09/2016 in setting of pancreatitis;  b. 09/2016 Echo: EF 65-60%, no rwma, Gr2 DD, mild MR, triv TR, PASP 66mmHg;  CHA2DS2VASc = 4-->Eliquis 5mg  BID. c. Recurred 06/2018 in setting of GIB.   Chronic diastolic CHF (congestive heart failure) ([place]) 01/28/2018   Diabetes mellitus    GI bleeding 06/2018   Gout    Hyperlipidemia    Hypertension    Hypertriglyceridemia    Pancreatitis    a. 09/2016 - Triglycerides 1,392 on admission.    Past Surgical History:  Procedure  Laterality Date   BIOPSY  06/20/2018   Procedure: BIOPSY;  Surgeon: Clarene Essex, MD;  Location: [facility];  Service: Endoscopy;;   CHOLECYSTECTOMY N/A 01/04/2014   Procedure: LAPAROSCOPIC CHOLECYSTECTOMY WITH INTRAOPERATIVE CHOLANGIOGRAM;  Surgeon: Ralene Ok, MD;  Location: [facility];  Service: General;  Laterality: N/A;   COLONOSCOPY WITH PROPOFOL N/A 06/21/2018   Procedure: COLONOSCOPY WITH PROPOFOL;   Surgeon: Ronald Lobo, MD;  Location: [facility];  Service: Endoscopy;  Laterality: N/A;   ESOPHAGOGASTRODUODENOSCOPY (EGD) WITH PROPOFOL N/A 06/20/2018   Procedure: ESOPHAGOGASTRODUODENOSCOPY (EGD) WITH PROPOFOL;  Surgeon: Clarene Essex, MD;  Location: [facility];  Service: Endoscopy;  Laterality: N/A;   LUNG SURGERY        Inpatient Medications: Scheduled Meds:  apixaban  5 mg Oral BID   atorvastatin  40 mg Oral Daily   ezetimibe  10 mg Oral Daily   insulin aspart  0-9 Units Subcutaneous TID WC   insulin glargine-yfgn  10 Units Subcutaneous Daily   lisinopril  5 mg Oral Daily   metoprolol succinate  100 mg Oral BID   Continuous Infusions:  PRN Meds:   Allergies:   No Known Allergies  Social History:   Social History   Socioeconomic History   Marital status: Single    Spouse name: Not on file   Number of children: Not on file   Years of education: Not on file   Highest education level: Not on file  Occupational History   Not on file  Tobacco Use   Smoking status: Never   Smokeless tobacco: Never  Vaping Use   Vaping Use: Never used  Substance and Sexual Activity   Alcohol use: No   Drug use: No   Sexual activity: Not Currently  Other Topics Concern   Not on file  Social History Narrative   Lives with her sister and does not need any assistance with ADLs.     Social Determinants of Health   Financial Resource Strain: Not on file  Food Insecurity: Not on file  Transportation Needs: Not on file  Physical Activity: Not on file  Stress: Not on file  Social Connections: Not on file  Intimate Partner Violence: Not on file    Family History:    Family History  Problem Relation Age of Onset   Heart attack Mother    Heart attack Father    Diabetes Sister    High blood pressure Neg Hx    High Cholesterol Neg Hx      ROS:  Please see the history of present illness.   All other ROS reviewed and negative.     Physical Exam/Data:   Vitals:   04/21/21  0013 04/21/21 0443 04/21/21 0718 04/21/21 0802  BP: 130/80 128/87 (!) 140/102 (!) 140/102  Pulse: (!) 103 (!) 107 98 (!) 118  Resp: 14 18 18    Temp:  98 F (36.7 C) 98 F (36.7 C)   TempSrc:  Oral Oral   SpO2: 94% 94% 98%   Weight:      Height:        Intake/Output Summary (Last 24 hours) at 04/21/2021 0803 Last data filed at 04/20/2021 1700 Gross per 24 hour  Intake 1000 ml  Output --  Net 1000 ml   Last 3 Weights 04/20/2021 04/11/2021 04/03/2021  Weight (lbs) 167 lb 1.7 oz 167 lb 167 lb 1.6 oz  Weight (kg) 75.8 kg 75.751 kg 75.796 kg     Body mass index is 29.6 kg/m.  General: 65  y.o. female resting comfortably in no acute distress. HEENT: Normocephalic and atraumatic. Sclera clear. EOMs intact. Neck: Supple. No carotid bruits. No JVD. Heart: irreg, no mr/g no jvd Lungs: No increased work of breathing. Clear to ausculation bilaterally. No wheezes, rhonchi, or rales.  Abdomen: Soft, non-distended, and non-tender to palpation. Bowel sounds present in all 4 quadrants.  MSK: Normal strength and tone for age. Extremities: No clubbing, cyanosis, or edema.    Skin: Warm and dry. Neuro: Alert and oriented x3. No focal deficits. Psych: Normal affect. Responds appropriately.  EKG:  The EKG was personally reviewed and demonstrates: Atrial fibrillation, rate 137 bpm, with no acute ST/T changes. Telemetry:  Telemetry was personally reviewed and demonstrates:  afib with RVR  Relevant CV Studies:  Complete Echo 02/28/2021: Impressions:  1. Compared to echo from Sept 2021, RV is dilated and function is down;  TR is increased, IVC dilated.   2. Left ventricular ejection fraction, by estimation, is 55 to 60%. The  left ventricle has normal function. The left ventricle has no regional  wall motion abnormalities. Left ventricular diastolic parameters are  indeterminate.   3. Right ventricular systolic function is mildly reduced. The right  ventricular size is mildly enlarged. Mildly  increased right ventricular  wall thickness. There is severely elevated pulmonary artery systolic  pressure.   4. Mild mitral valve regurgitation.   5. Tricuspid valve regurgitation is moderate to severe.   6. The aortic valve is tricuspid. Aortic valve regurgitation is not  visualized. Mild to moderate aortic valve sclerosis/calcification is  present, without any evidence of aortic stenosis.   7. The inferior vena cava is dilated in size with <50% respiratory  variability, suggesting right atrial pressure of 15 mmHg. _______________  Limited Echo 04/03/2021: Impressions:1. Left ventricular ejection fraction, by estimation, is 50 to 55%. The  left ventricle has low normal function. Left ventricular endocardial  border not optimally defined to evaluate regional wall motion. Left  ventricular diastolic function could not be  evaluated.   2. Right ventricular systolic function is mildly reduced. The right  ventricular size is mildly enlarged. There is normal pulmonary artery  systolic pressure.   3. Left atrial size was moderately dilated.   4. The mitral valve is normal in structure. Mild mitral valve  regurgitation.   5. Tricuspid valve regurgitation is mild to moderate.   6. Concerning for aortic valve vegetation versus degenerative changes.  There is mild calcification of the aortic valve. There is mild thickening  of the aortic valve.   7. The inferior vena cava is normal in size with <50% respiratory  variability, suggesting right atrial pressure of 8 mmHg.   Conclusion(s)/Recommendation(s): Poor transthoracic image quality.  Recommend TEE for better evaluation of possible aortic valve vegetation.   Laboratory Data:  High Sensitivity Troponin:   Recent Labs  Lab 03/30/21 0535 03/30/21 0720 04/20/21 0928 04/20/21 1300  TROPONINIHS 11 12 12 8      Chemistry Recent Labs  Lab 04/20/21 0928 04/21/21 0143  NA 136 137  K 3.9 4.0  CL 103 103  CO2 20* 24  GLUCOSE 198*  127*  BUN 26* 23  CREATININE 1.14* 0.85  CALCIUM 8.8* 8.9  MG  --  1.3*  GFRNONAA 54* >60  ANIONGAP 13 10    No results for input(s): PROT, ALBUMIN, AST, ALT, ALKPHOS, BILITOT in the last 168 hours. Lipids  Recent Labs  Lab 04/21/21 0143  CHOL 132  TRIG 263*  HDL 33*  LDLCALC 46  CHOLHDL 4.0    Hematology Recent Labs  Lab 04/20/21 0928  WBC 7.3  RBC 4.15  HGB 11.8*  HCT 36.6  MCV 88.2  MCH 28.4  MCHC 32.2  RDW 13.4  PLT 253   Thyroid  Recent Labs  Lab 04/21/21 0143  TSH 0.633    BNPNo results for input(s): BNP, PROBNP in the last 168 hours.  DDimer No results for input(s): DDIMER in the last 168 hours.   Radiology/Studies:  DG Chest Portable 1 View  Result Date: 04/20/2021 CLINICAL DATA:  Short of breath EXAM: PORTABLE CHEST 1 VIEW COMPARISON:  03/31/2021 FINDINGS: The heart size and mediastinal contours are within normal limits. Both lungs are clear. The visualized skeletal structures are unremarkable. Surgical clips overriding the right lower lobe. IMPRESSION: No active disease. Electronically Signed   By: Franchot Gallo M.D.   On: 04/20/2021 10:18     Assessment and Plan:   Permanent Atrial Fibrillation  Presented with afib with RVR. Has been compliant with home toprol. Will add diltiazem 30mg  qid, titrate as needed for rate control and consolidate to long acting once rates are controlled.    Risk Assessment/Risk Scores:          CHA2DS2-VASc Score = 4   This indicates a 4.8% annual risk of stroke. The patient's score is based upon: CHF History: 1 HTN History: 1 Diabetes History: 1 Stroke History: 0 Vascular Disease History: 0 Age Score: 0 Gender Score: 1     For questions or updates, please contact [place] Please consult [email or web address] for contact info under    Signed, Darreld Mclean, PA-C  04/21/2021 8:03 AM  Attending note  Patient seen and discussed with PA Sarajane Jews, I agree with her documentation. 65 yo female hsitory  of permanent afib, HL, DM2, MR, chronic HF with preserved EF, OSA presents with dizziness and palpitatoins. Reports medication compliance.    WBC 7.3 Hgb 11.8 Plt 253 K 3.9 Cr 1.14 BUN 26 TSH 0.633 Trop 12-->8 CXR no acute process EKG afib RVR 03/2021 echo LVEF 50-55%, mild RV dysfunction   History of permanent afib from notes. Primary cardiologist notes mention bradycardina when on amiodarone, had to be discontinued. Has been on just rate control. BPs are elevated and would tolerate adding on diltiazem 30mg  qid, can consolidate to long acting dilt once rates are controlled. Continue home eliquis   Carlyle Dolly MD

## 2021-04-21 NOTE — Progress Notes (Signed)
FPTS Brief Note Reviewed patient's vitals, recent notes.  Vitals:   04/20/21 2044 04/21/21 0013  BP: 126/79 130/80  Pulse: (!) 112 (!) 103  Resp: 19 14  Temp: 98.4 F (36.9 C)   SpO2: 93% 94%   At this time, no change in plan from day progress note.  Shirlean Mylar, MD Page [phone] with questions about this patient.

## 2021-04-21 NOTE — Hospital Course (Addendum)
Emily Farmer is a 65 y.o. female who presented with dizziness who was found to be in a-fib with RVR. PMH is significant for paroxysmal a-fib, HTN, HFpEF, T2DM, HLD, iron deficiency anemia, and recent Klebsiella bacteremia 2/2 pyelonephritis. Below is the brief hospital course    A-Fib with RVR  dizziness Patient presented with dizziness since this morning. In the ED, was found to be in a-fib with RVR. Remainder of vitals stable. Workup including CBC, BMP, UA, CXR were all unremarkable. Troponin negative x2 (12>8). Precipitating factor unclear but suspect secondary to patient not taking her metoprolol this morning. No concern for infection at this time (afebrile, normal WBC). No chest pain, negative troponin so no concern for ACS/MI. Received 500cc fluid bolus x2 in the ED with slight improvement in her HR. Magnesium dropped below 2, was repleted and at end of hospitalization was 2.1. Patient had dizziness when standing up which improved greatly at end of hospitalization  HTN BP elevated to 150s-170s systolic in the ED. Again, patient had not taken her home meds, home lisinopril 5mg  was restarted and BP ranges were in the 120s to130s systolic. -Monitor BP   Poorly Controlled T2DM Most recent A1c 12.8 on 02/27/21. Home meds: glargine 27u at bedtime, metformin 250mg  q6h, victoza daily. Was started on 10 units basal insulin and sensitive sliding scale.  At end of hospitalization glucose ranges in 200s. Will discharge on home regimen given elevate glucoses and include parameters in patient instructions.   HFpEF EF 55-60% in September. Appears euvolemic on exams  HLD Home meds: atorvastatin 40mg  daily, zetia 10mg  daily.Lipid panel was obtained which showed LDL 46, elevated TG 263, total cholesterol 132. Continued home atorvastatin and Zetia   Iron Deficiency Anemia Hgb 11.8 on admission, which is at her baseline.   Hx of Klebsiella Bacteremia 2/2 pyelo Resolved. Completed course of IV Ceftriaxone on  10/25. UA unremarkable on admission.

## 2021-04-22 ENCOUNTER — Ambulatory Visit (HOSPITAL_BASED_OUTPATIENT_CLINIC_OR_DEPARTMENT_OTHER): Payer: Medicaid Other | Admitting: Family

## 2021-04-22 ENCOUNTER — Other Ambulatory Visit: Payer: Self-pay

## 2021-04-22 DIAGNOSIS — I4891 Unspecified atrial fibrillation: Secondary | ICD-10-CM

## 2021-04-22 LAB — BASIC METABOLIC PANEL
Anion gap: 9 (ref 5–15)
BUN: 21 mg/dL (ref 8–23)
CO2: 26 mmol/L (ref 22–32)
Calcium: 9.4 mg/dL (ref 8.9–10.3)
Chloride: 100 mmol/L (ref 98–111)
Creatinine, Ser: 1.21 mg/dL — ABNORMAL HIGH (ref 0.44–1.00)
GFR, Estimated: 50 mL/min — ABNORMAL LOW (ref >60)
Glucose, Bld: 245 mg/dL — ABNORMAL HIGH (ref 70–99)
Potassium: 4.1 mmol/L (ref 3.5–5.1)
Sodium: 135 mmol/L (ref 135–145)

## 2021-04-22 LAB — MAGNESIUM
Magnesium: 1.5 mg/dL — ABNORMAL LOW (ref 1.7–2.4)
Magnesium: 2.1 mg/dL (ref 1.7–2.4)

## 2021-04-22 LAB — GLUCOSE, CAPILLARY
Glucose-Capillary: 245 mg/dL — ABNORMAL HIGH (ref 70–99)
Glucose-Capillary: 259 mg/dL — ABNORMAL HIGH (ref 70–99)
Glucose-Capillary: 333 mg/dL — ABNORMAL HIGH (ref 70–99)

## 2021-04-22 MED ORDER — DILTIAZEM HCL ER COATED BEADS 120 MG PO CP24
120.0000 mg | ORAL_CAPSULE | Freq: Every day | ORAL | 0 refills | Status: DC
  Filled 2021-04-22: qty 30, 30d supply, fill #0

## 2021-04-22 MED ORDER — DILTIAZEM HCL ER COATED BEADS 120 MG PO CP24
120.0000 mg | ORAL_CAPSULE | Freq: Every day | ORAL | Status: DC
  Administered 2021-04-22: 120 mg via ORAL
  Filled 2021-04-22: qty 1

## 2021-04-22 MED ORDER — MAGNESIUM SULFATE 2 GM/50ML IV SOLN
2.0000 g | Freq: Once | INTRAVENOUS | Status: AC
  Administered 2021-04-22: 2 g via INTRAVENOUS
  Filled 2021-04-22: qty 50

## 2021-04-22 MED ORDER — EZETIMIBE 10 MG PO TABS
10.0000 mg | ORAL_TABLET | Freq: Every day | ORAL | 0 refills | Status: DC
  Filled 2021-04-22: qty 30, 30d supply, fill #0

## 2021-04-22 MED ORDER — METOPROLOL SUCCINATE ER 100 MG PO TB24
100.0000 mg | ORAL_TABLET | Freq: Two times a day (BID) | ORAL | 0 refills | Status: DC
  Filled 2021-04-22: qty 60, 30d supply, fill #0

## 2021-04-22 NOTE — Progress Notes (Addendum)
FPTS Brief Progress Note  S:Patient comfortably resting in bed. Nurse repots patient was complained of being a little light headed earlier, when standing. No fall, other symptoms or further complaints.   O: BP (!) 133/95   Pulse 90   Temp 98 F (36.7 C) (Oral)   Resp 16   Ht 5\' 3"  (1.6 m)   Wt 75.8 kg   SpO2 99%   BMI 29.60 kg/m     A/P: A-Fib with RVR Nurse complains of potential orthostatic hypotension in patient.  -Morning orthostatic BP -No changes, plans per day team  - Orders reviewed. Labs for AM ordered, which was adjusted as needed.   , MD 04/22/2021, 1:52 AM PGY-1, [facility] Night Resident  Please page [phone] with questions.

## 2021-04-22 NOTE — Progress Notes (Addendum)
Inpatient Diabetes Program Recommendations  AACE/ADA: New Consensus Statement on Inpatient Glycemic Control (2015)  Target Ranges:  Prepandial:   less than 140 mg/dL      Peak postprandial:   less than 180 mg/dL (1-2 hours)      Critically ill patients:  140 - 180 mg/dL   Lab Results  Component Value Date   GLUCAP 259 (H) 04/22/2021   HGBA1C 12.8 (H) 02/27/2021    Review of Glycemic Control Results for Emily Farmer, Emily Farmer (MRN [id number]) as of 04/22/2021 11:30  Ref. Range 04/22/2021 07:35 04/22/2021 11:16  Glucose-Capillary Latest Ref Range: 70 - 99 mg/dL 662 (H) 947 (H)   Diabetes history: Type 2 DM Outpatient Diabetes medications: Basaglar 27 units QP, Humalog 10 units TID, Metformin 250 mg QID, Victoza 1.2 mg QD Current orders for Inpatient glycemic control: Semglee 10 units QD, Novolog 0-9 units TID  Inpatient Diabetes Program Recommendations:    Consider increasing Semglee to 15 units QD and adding Novolog 3 units TID (assuming patient is consuming >50% of meals).   Spoke with patient regarding outpatient diabetes management. Is followed by [facility] for her DM. Patient is frustrated by diabetes; verifies home regimen. Does not miss doses. Reviewed patient's current A1c of 12.8%. Explained what a A1c is and what it measures. Also reviewed goal A1c with patient, importance of good glucose control @ home, and blood sugar goals. Reviewed patho of DM, need for insulin, role of pancreas, vascular changes and commorbidities.  Patient has a meter and testing supplies. Reports checking atleast 2 times per day (119-300s). Reviewed when to call MD. Is scheduled for follow up in two weeks.  Admits to drinking sodas and excessive CHO intake. Attempting to blame sister for personal consumption. Reviewed impact of taking in CHO in excess, goal setting, alternatives, plate method and reviewed mindfulness. Dietitian consult placed.  Patient has no further questions at this time.   Thanks, Lujean Rave,  MSN, RNC-OB Diabetes Coordinator [phone] (8a-5p)    Thanks, Lujean Rave, MSN, RNC-OB Diabetes Coordinator [phone] (8a-5p)

## 2021-04-22 NOTE — TOC Transition Note (Signed)
Transition of Care [facility]) - CM/SW Discharge Note   Patient Details  Name: Emily Farmer MRN: [id number] Date of Birth: November 14, 1955  Transition of Care [facility]) CM/SW Contact:  Bess Kinds, RN Phone Number: [phone] 04/22/2021, 4:14 PM   Clinical Narrative:     Notified by nursing that patient needed her meds picked up at [facility] pharmacy before it closes. CM able to pick up discharge meds from pharmacy which were applied to her account. Discharge medications delivered to bedside, and patient advised that cost was applied to her account. Patient expressed appreciation.   Patient requesting taxi voucher for transportation home. Address verified. Voucher given to charge RN.   No further TOC needs identified at this time.   Final next level of care: Home/Self Care Barriers to Discharge: No Barriers Identified   Patient Goals and CMS Choice Patient states their goals for this hospitalization and ongoing recovery are:: return home CMS Medicare.gov Compare Post Acute Care list provided to:: Patient Choice offered to / list presented to : NA  Discharge Placement                       Discharge Plan and Services                                     Social Determinants of Health (SDOH) Interventions     Readmission Risk Interventions Readmission Risk Prevention Plan 04/03/2021  Transportation Screening Complete  PCP or Specialist Appt within 5-7 Days Complete  Home Care Screening Complete  Medication Review (RN CM) Complete  Some recent data might be hidden

## 2021-04-22 NOTE — Discharge Instructions (Addendum)
You were hospitalized at [facility] due to dizziness and increased, irregular heart rate.  We expect this is from atrial fibrillation which improved after medications and monitoring. The cardiology team followed you throughout your stay in the hospital as well.  We are so glad you are feeling better.  Be sure to follow-up with your regularly scheduled appointments.  Please also be sure to follow-up with your primary care doctor and the cardiologist  at your earliest convenience.  Thank you for allowing [place] to be a part of your medical care.  Cardiology started you on a new medication called Cardizem, please take this daily and refer to the medication list on how to take all of your medications. Remember to take your blood sugars before insulin administration. If blood sugars fasting are less than 100 please do not administer insulin Remember to sit on edge of bed or chair before standing up too quickly as this can cause your blood pressure to drop quickly which can make you dizzy  Take care, [facility] Family Medicine Team

## 2021-04-22 NOTE — Evaluation (Signed)
Physical Therapy Evaluation Patient Details Name: Emily Farmer MRN: [id number] DOB: [birth year] Today's Date: 04/22/2021  History of Present Illness  Pt is a 65 y/o female admitted 11/5 secondary to SOB, dizziness and heart racing, likely from a fib with RVR. PMH includes a fib, CHF, DM, and HTN.  Clinical Impression  Pt admitted secondary to problem above with deficits below. Mild unsteadiness noted, especially when performing DGI tasks, and required min guard to supervision for safety.  [name] when she rides the bus and also reports she is worried about the steps to enter, which is understandable. Feel pt would benefit from transportation that could get pt closer to apartment if able. Anticipate pt will progress well during acute stay. Will continue to follow acutely.      Recommendations for follow up therapy are one component of a multi-disciplinary discharge planning process, led by the attending physician.  Recommendations may be updated based on patient status, additional functional criteria and insurance authorization.  Follow Up Recommendations No PT follow up    Assistance Recommended at Discharge Intermittent Supervision/Assistance  Functional Status Assessment Patient has had a recent decline in their functional status and demonstrates the ability to make significant improvements in function in a reasonable and predictable amount of time.  Equipment Recommendations  None recommended by PT    Recommendations for Other Services       Precautions / Restrictions Precautions Precautions: Fall Restrictions Weight Bearing Restrictions: No      Mobility  Bed Mobility Overal bed mobility: Independent                  Transfers Overall transfer level: Modified independent                 General transfer comment: Increased time required    Ambulation/Gait Ambulation/Gait assistance: Min  guard;Supervision Gait Distance (Feet): 115 Feet Assistive device: None Gait Pattern/deviations: Step-through pattern;Decreased stride length;Drifts right/left Gait velocity: Decreased     General Gait Details: Pt with mild instability when performing DGI tasks, but no overt LOB noted. Min guard to supervision for safety.  Stairs            Wheelchair Mobility    Modified Rankin (Stroke Patients Only)       Balance Overall balance assessment: Needs assistance Sitting-balance support: No upper extremity supported;Feet supported Sitting balance-Leahy Scale: Good     Standing balance support: No upper extremity supported;During functional activity Standing balance-Leahy Scale: Fair                   Standardized Balance Assessment Standardized Balance Assessment : Dynamic Gait Index   Dynamic Gait Index Level Surface: Mild Impairment Gait with Horizontal Head Turns: Mild Impairment Gait with Vertical Head Turns: Mild Impairment Gait and Pivot Turn: Mild Impairment Step Over Obstacle: Mild Impairment Step Around Obstacles: Mild Impairment       Pertinent Vitals/Pain Pain Assessment: No/denies pain    Home Living Family/patient expects to be discharged to:: Private residence Living Arrangements: Other relatives (sister) Available Help at Discharge: Family;Available 24 hours/day Type of Home: Apartment Home Access: Level entry       Home Layout: One level Home Equipment: None      Prior Function Prior Level of Function : Independent/Modified Independent                     Hand Dominance  Extremity/Trunk Assessment   Upper Extremity Assessment Upper Extremity Assessment: Overall WFL for tasks assessed    Lower Extremity Assessment Lower Extremity Assessment: Generalized weakness    Cervical / Trunk Assessment Cervical / Trunk Assessment: Normal  Communication   Communication: No difficulties  Cognition  Arousal/Alertness: Awake/alert Behavior During Therapy: WFL for tasks assessed/performed Overall Cognitive Status: Within Functional Limits for tasks assessed                                          General Comments General comments (skin integrity, edema, etc.): Pt voicing concerns about riding bus home as they park at the street and she has to walk up large hill. Also worried about having difficulty with steps.    Exercises     Assessment/Plan    PT Assessment Patient needs continued PT services  PT Problem List Decreased strength;Decreased balance;Decreased mobility       PT Treatment Interventions DME instruction;Gait training;Functional mobility training;Therapeutic activities;Therapeutic exercise;Balance training;Stair training;Patient/family education    PT Goals (Current goals can be found in the Care Plan section)  Acute Rehab PT Goals Patient Stated Goal: to go home PT Goal Formulation: With patient Time For Goal Achievement: 05/06/21 Potential to Achieve Goals: Good    Frequency Min 3X/week   Barriers to discharge        Co-evaluation               AM-PAC PT "6 Clicks" Mobility  Outcome Measure Help needed turning from your back to your side while in a flat bed without using bedrails?: None Help needed moving from lying on your back to sitting on the side of a flat bed without using bedrails?: None Help needed moving to and from a bed to a chair (including a wheelchair)?: A Little Help needed standing up from a chair using your arms (e.g., wheelchair or bedside chair)?: None Help needed to walk in hospital room?: A Little Help needed climbing 3-5 steps with a railing? : A Lot 6 Click Score: 20    End of Session Equipment Utilized During Treatment: Gait belt Activity Tolerance: Patient tolerated treatment well Patient left: in bed;with call bell/phone within reach Nurse Communication: Mobility status PT Visit Diagnosis: Unsteadiness  on feet (R26.81);Muscle weakness (generalized) (M62.81)    Time: [id number] PT Time Calculation (min) (ACUTE ONLY): 20 min   Charges:   PT Evaluation $PT Eval Low Complexity: 1 Low          Emily Farmer, DPT  Acute Rehabilitation Services  Pager: [phone] Office: [phone]   Emily Farmer 04/22/2021, 1:26 PM

## 2021-04-22 NOTE — Progress Notes (Signed)
DAILY PROGRESS NOTE   Patient Name: Emily Farmer Date of Encounter: 04/22/2021 Cardiologist: Chrystie Nose, MD  Chief Complaint   No complaints  Patient Profile   Elbert Polyakov is a 65 y.o. female with a history of persistent atrial fibrillation on Eliquis, chronic diastolic CHF, obstructive sleep apnea on BiPAP, moderate to severe TR, mild MR, hyperlipidemia, poorly controlled type 2 diabetes, pancreatitis, and anemia who is being seen today for evaluation of atrial fibrillation with RVR at the request of Dr. Lum Babe.  Subjective   No complaints. Afib appears rate-controlled  Objective   Vitals:   04/21/21 2345 04/21/21 2350 04/22/21 0518 04/22/21 0737  BP: 114/80 (!) 133/95 (!) 133/99 (!) 127/95  Pulse: 95 90 (!) 114 82  Resp: 16  18 18   Temp: 98 F (36.7 C)  97.7 F (36.5 C) 98.1 F (36.7 C)  TempSrc: Oral  Oral Oral  SpO2: 100% 99% 97% 95%  Weight:      Height:        Intake/Output Summary (Last 24 hours) at 04/22/2021 0945 Last data filed at 04/21/2021 1410 Gross per 24 hour  Intake 45.39 ml  Output --  Net 45.39 ml   Filed Weights   04/20/21 13/05/22  Weight: 75.8 kg    Physical Exam   General appearance: alert and no distress Heart: irregularly irregular rhythm Abdomen: soft, non-tender; bowel sounds normal; no masses,  no organomegaly Extremities: extremities normal, atraumatic, no cyanosis or edema Pulses: 2+ and symmetric Skin: Skin color, texture, turgor normal. No rashes or lesions Neurologic: Grossly normal Psych: Pleasant  Inpatient Medications    Scheduled Meds:  apixaban  5 mg Oral BID   atorvastatin  40 mg Oral Daily   diltiazem  30 mg Oral Q6H   ezetimibe  10 mg Oral Daily   insulin aspart  0-9 Units Subcutaneous TID WC   insulin glargine-yfgn  10 Units Subcutaneous Daily   lisinopril  5 mg Oral Daily   metoprolol succinate  100 mg Oral BID    Continuous Infusions:   PRN Meds: acetaminophen   Labs   Results for orders  placed or performed during the hospital encounter of 04/20/21 (from the past 48 hour(s))  Troponin I (High Sensitivity)     Status: None   Collection Time: 04/20/21  1:00 PM  Result Value Ref Range   Troponin I (High Sensitivity) 8 <18 ng/L    Comment: (NOTE) Elevated high sensitivity troponin I (hsTnI) values and significant  changes across serial measurements may suggest ACS but many other  chronic and acute conditions are known to elevate hsTnI results.  Refer to the "Links" section for chest pain algorithms and additional  guidance. Performed at [facility], [address], [place], [place]   Glucose, capillary     Status: Abnormal   Collection Time: 04/20/21  4:47 PM  Result Value Ref Range   Glucose-Capillary 102 (H) 70 - 99 mg/dL    Comment: Glucose reference range applies only to samples taken after fasting for at least 8 hours.  Resp Panel by RT-PCR (Flu A&B, Covid) Nasopharyngeal Swab     Status: None   Collection Time: 04/20/21  6:21 PM   Specimen: Nasopharyngeal Swab; Nasopharyngeal(NP) swabs in vial transport medium  Result Value Ref Range   SARS Coronavirus 2 by RT PCR NEGATIVE NEGATIVE    Comment: (NOTE) SARS-CoV-2 target nucleic acids are NOT DETECTED.  The SARS-CoV-2 RNA is generally detectable in upper respiratory specimens  during the acute phase of infection. The lowest concentration of SARS-CoV-2 viral copies this assay can detect is 138 copies/mL. A negative result does not preclude SARS-Cov-2 infection and should not be used as the sole basis for treatment or other patient management decisions. A negative result may occur with  improper specimen collection/handling, submission of specimen other than nasopharyngeal swab, presence of viral mutation(s) within the areas targeted by this assay, and inadequate number of viral copies(<138 copies/mL). A negative result must be combined with clinical observations, patient history, and  epidemiological information. The expected result is Negative.  Fact Sheet for Patients:  BloggerCourse.com  Fact Sheet for Healthcare Providers:  SeriousBroker.it  This test is no t yet approved or cleared by the [place] FDA and  has been authorized for detection and/or diagnosis of SARS-CoV-2 by FDA under an Emergency Use Authorization (EUA). This EUA will remain  in effect (meaning this test can be used) for the duration of the COVID-19 declaration under Section 564(b)(1) of the Act, 21 U.S.C.section 360bbb-3(b)(1), unless the authorization is terminated  or revoked sooner.       Influenza A by PCR NEGATIVE NEGATIVE   Influenza B by PCR NEGATIVE NEGATIVE    Comment: (NOTE) The Xpert Xpress SARS-CoV-2/FLU/RSV plus assay is intended as an aid in the diagnosis of influenza from Nasopharyngeal swab specimens and should not be used as a sole basis for treatment. Nasal washings and aspirates are unacceptable for Xpert Xpress SARS-CoV-2/FLU/RSV testing.  Fact Sheet for Patients: BloggerCourse.com  Fact Sheet for Healthcare Providers: SeriousBroker.it  This test is not yet approved or cleared by the [place] FDA and has been authorized for detection and/or diagnosis of SARS-CoV-2 by FDA under an Emergency Use Authorization (EUA). This EUA will remain in effect (meaning this test can be used) for the duration of the COVID-19 declaration under Section 564(b)(1) of the Act, 21 U.S.C. section 360bbb-3(b)(1), unless the authorization is terminated or revoked.  Performed at [facility], [address], [place], [place] [address]   Glucose, capillary     Status: Abnormal   Collection Time: 04/20/21  9:22 PM  Result Value Ref Range   Glucose-Capillary 144 (H) 70 - 99 mg/dL    Comment: Glucose reference range applies only to samples taken after fasting for at least 8  hours.  Basic metabolic panel     Status: Abnormal   Collection Time: 04/21/21  1:43 AM  Result Value Ref Range   Sodium 137 135 - 145 mmol/L   Potassium 4.0 3.5 - 5.1 mmol/L   Chloride 103 98 - 111 mmol/L   CO2 24 22 - 32 mmol/L   Glucose, Bld 127 (H) 70 - 99 mg/dL    Comment: Glucose reference range applies only to samples taken after fasting for at least 8 hours.   BUN 23 8 - 23 mg/dL   Creatinine, Ser 6.04 0.44 - 1.00 mg/dL   Calcium 8.9 8.9 - 54.0 mg/dL   GFR, Estimated >98 >11 mL/min    Comment: (NOTE) Calculated using the CKD-EPI Creatinine Equation (2021)    Anion gap 10 5 - 15    Comment: Performed at [facility], [address], [place], [place] [address]  Magnesium     Status: Abnormal   Collection Time: 04/21/21  1:43 AM  Result Value Ref Range   Magnesium 1.3 (L) 1.7 - 2.4 mg/dL    Comment: Performed at [facility], [address], [place], [place]  [address]  TSH     Status: None   Collection Time: 04/21/21  1:43 AM  Result Value Ref Range   TSH 0.633 0.350 - 4.500 uIU/mL    Comment: Performed by a 3rd Generation assay with a functional sensitivity of <=0.01 uIU/mL. Performed at [facility], [address] [place], [place] [address]   Lipid panel     Status: Abnormal   Collection Time: 04/21/21  1:43 AM  Result Value Ref Range   Cholesterol 132 0 - 200 mg/dL   Triglycerides 761 (H) <150 mg/dL   HDL 33 (L) >95 mg/dL   Total CHOL/HDL Ratio 4.0 RATIO   VLDL 53 (H) 0 - 40 mg/dL   LDL Cholesterol 46 0 - 99 mg/dL    Comment:        Total Cholesterol/HDL:CHD Risk Coronary Heart Disease Risk Table                     Men   Women  1/2 Average Risk   3.4   3.3  Average Risk       5.0   4.4  2 X Average Risk   9.6   7.1  3 X Average Risk  23.4   11.0        Use the calculated Patient Ratio above and the CHD Risk Table to determine the patient's CHD Risk.        ATP III CLASSIFICATION (LDL):  <100     mg/dL   Optimal  093-267  mg/dL   Near  or Above                    Optimal  130-159  mg/dL   Borderline  124-580  mg/dL   High  >998     mg/dL   Very High Performed at [facility], [address] [place], [place] [address]   Glucose, capillary     Status: Abnormal   Collection Time: 04/21/21  7:51 AM  Result Value Ref Range   Glucose-Capillary 197 (H) 70 - 99 mg/dL    Comment: Glucose reference range applies only to samples taken after fasting for at least 8 hours.  Glucose, capillary     Status: Abnormal   Collection Time: 04/21/21 11:28 AM  Result Value Ref Range   Glucose-Capillary 172 (H) 70 - 99 mg/dL    Comment: Glucose reference range applies only to samples taken after fasting for at least 8 hours.  Glucose, capillary     Status: Abnormal   Collection Time: 04/21/21  4:12 PM  Result Value Ref Range   Glucose-Capillary 191 (H) 70 - 99 mg/dL    Comment: Glucose reference range applies only to samples taken after fasting for at least 8 hours.  Glucose, capillary     Status: Abnormal   Collection Time: 04/21/21  9:51 PM  Result Value Ref Range   Glucose-Capillary 217 (H) 70 - 99 mg/dL    Comment: Glucose reference range applies only to samples taken after fasting for at least 8 hours.  Basic metabolic panel     Status: Abnormal   Collection Time: 04/22/21  3:41 AM  Result Value Ref Range   Sodium 135 135 - 145 mmol/L   Potassium 4.1 3.5 - 5.1 mmol/L   Chloride 100 98 - 111 mmol/L   CO2 26 22 - 32 mmol/L   Glucose, Bld 245 (H) 70 - 99 mg/dL    Comment: Glucose  reference range applies only to samples taken after fasting for at least 8 hours.   BUN 21 8 - 23 mg/dL   Creatinine, Ser 5.63 (H) 0.44 - 1.00 mg/dL   Calcium 9.4 8.9 - 14.9 mg/dL   GFR, Estimated 50 (L) >60 mL/min    Comment: (NOTE) Calculated using the CKD-EPI Creatinine Equation (2021)    Anion gap 9 5 - 15    Comment: Performed at [facility], [address], [place], [place] [address]  Magnesium     Status: Abnormal   Collection  Time: 04/22/21  3:41 AM  Result Value Ref Range   Magnesium 1.5 (L) 1.7 - 2.4 mg/dL    Comment: Performed at [facility], [address] [place], [place] [address]  Glucose, capillary     Status: Abnormal   Collection Time: 04/22/21  7:35 AM  Result Value Ref Range   Glucose-Capillary 245 (H) 70 - 99 mg/dL    Comment: Glucose reference range applies only to samples taken after fasting for at least 8 hours.    ECG   Afib at 90 - Personally Reviewed  Telemetry   Afib, rate controlled - Personally Reviewed  Radiology    DG Chest Portable 1 View  Result Date: 04/20/2021 CLINICAL DATA:  Short of breath EXAM: PORTABLE CHEST 1 VIEW COMPARISON:  03/31/2021 FINDINGS: The heart size and mediastinal contours are within normal limits. Both lungs are clear. The visualized skeletal structures are unremarkable. Surgical clips overriding the right lower lobe. IMPRESSION: No active disease. Electronically Signed   By: Marlan Palau M.D.   On: 04/20/2021 10:18    Cardiac Studies   None  Assessment   Active Problems:   Atrial fibrillation with RVR (HCC)   Plan   Afib is longstanding persistent at this point if not permanent. She is now rate controlled with the addition of diltiazem - will convert to 120 mg LA diltiazem daily - continue metoprolol and Eliquis. Can follow-up with me in the office.  [facility] will sign off.   Medication Recommendations:  Diltiazem LA 120 mg daily, metoprolol XL 100 mg BID and Eliquis 5 mg BID Other recommendations (labs, testing, etc):  none Follow up as an outpatient:  Tanee Henery or APP in 2 weeks   Time Spent Directly with Patient:  I have spent a total of 25 minutes with the patient reviewing hospital notes, telemetry, EKGs, labs and examining the patient as well as establishing an assessment and plan that was discussed personally with the patient.  > 50% of time was spent in direct patient care.  Length of Stay:  LOS: 0 days   Chrystie Nose, MD, [facility], FACP  [place]  [facility]  Medical Director of the Advanced Lipid Disorders &  Cardiovascular Risk Reduction Clinic Diplomate of the American Board of Clinical Lipidology Attending Cardiologist  Direct Dial: [phone]  Fax: [phone]  Website:  [email or web address] [email or web address] [email or web address] 04/22/2021, 9:45 AM

## 2021-04-23 ENCOUNTER — Telehealth: Payer: Self-pay

## 2021-04-23 NOTE — Telephone Encounter (Signed)
Transition Care Management Unsuccessful Follow-up Telephone Call  Date of discharge and from where:   04/22/2021, [facility]   Attempts:  1st Attempt  Reason for unsuccessful TCM follow-up call:  Left voice message on # [phone]. Call back requested.   Patient has appointment with Dr Laural Benes @ [facility] - 05/07/2021.

## 2021-04-24 ENCOUNTER — Telehealth: Payer: Self-pay

## 2021-04-24 NOTE — Telephone Encounter (Signed)
Transition Care Management Unsuccessful Follow-up Telephone Call  Date of discharge and from where:  04/22/2021, [facility]   Attempts:  2nd Attempt  Reason for unsuccessful TCM follow-up call:  Left voice message  on # [phone]. Call back requested.    Patient has appointment with Dr Laural Benes @ [facility] - 05/07/2021.

## 2021-04-24 NOTE — Telephone Encounter (Signed)
Burna Mortimer from [facility] called in to check the status and stated they have still not received the fax and if it could be refaxed to [phone]. Please advise.

## 2021-04-25 ENCOUNTER — Telehealth: Payer: Self-pay

## 2021-04-25 ENCOUNTER — Telehealth (INDEPENDENT_AMBULATORY_CARE_PROVIDER_SITE_OTHER): Payer: Self-pay

## 2021-04-25 LAB — CULTURE, BLOOD (ROUTINE X 2)
Culture: NO GROWTH
Culture: NO GROWTH
Special Requests: ADEQUATE

## 2021-04-25 NOTE — Telephone Encounter (Signed)
Forms has been refaxed to number provided in message

## 2021-04-25 NOTE — Telephone Encounter (Signed)
Transition Care Management Follow-up Telephone Call Date of discharge and from where: 04/22/2021, [facility]  How have you been since you were released from the hospital? She said she is feeling pretty good.  Any questions or concerns? No  Items Reviewed: Did the pt receive and understand the discharge instructions provided? Yes  Medications obtained and verified? Yes  - she said she has all medications and did not have any questions about the med regime.  Other? No  Any new allergies since your discharge? No  Dietary orders reviewed? Yes Do you have support at home? Yes , lives with her sister  Home Care and Equipment/Supplies: Were home health services ordered? no If so, what is the name of the agency? N/a  Has the agency set up a time to come to the patient's home? not applicable Were any new equipment or medical supplies ordered?  No What is the name of the medical supply agency? N/a Were you able to get the supplies/equipment? not applicable Do you have any questions related to the use of the equipment or supplies? No  Has a working Personnel officer Questionnaire: (I = Independent and D = Dependent) ADLs: independent   Follow up appointments reviewed:  PCP Hospital f/u appt confirmed? Yes  Scheduled to see Dr Laural Benes on 05/07/2021. She did not want to schedule an appointment to be seen sooner  Specialist Hospital f/u appt confirmed? Yes  Scheduled to see cardiology- 05/14/2021 and endocrinology - 05/16/2021.  Are transportation arrangements needed? No  If their condition worsens, is the pt aware to call PCP or go to the Emergency Dept.? Yes Was the patient provided with contact information for the PCP's office or ED? Yes Was to pt encouraged to call back with questions or concerns? Yes

## 2021-04-25 NOTE — Telephone Encounter (Signed)
Copied from CRM [id number]. Topic: General - Other >> Apr 24, 2021 11:08 AM Marylen Ponto wrote: Reason for CRM: Pt returned call to [facility] Pt requests call back. Cb# [phone]

## 2021-04-25 NOTE — Telephone Encounter (Signed)
Transition Care Management Unsuccessful Follow-up Telephone Call  Date of discharge and from where:  04/22/2021, [facility]   Attempts:  3rd Attempt  Reason for unsuccessful TCM follow-up call:  Left voice message   on # [phone]. Call back requested.    Patient has appointment with Dr Laural Benes @ [facility] - 05/07/2021.

## 2021-05-07 ENCOUNTER — Encounter: Payer: Self-pay | Admitting: Internal Medicine

## 2021-05-07 ENCOUNTER — Other Ambulatory Visit: Payer: Self-pay

## 2021-05-07 ENCOUNTER — Ambulatory Visit: Payer: Self-pay | Attending: Internal Medicine | Admitting: Internal Medicine

## 2021-05-07 VITALS — BP 92/66 | HR 96 | Ht 63.0 in | Wt 161.2 lb

## 2021-05-07 DIAGNOSIS — E1142 Type 2 diabetes mellitus with diabetic polyneuropathy: Secondary | ICD-10-CM

## 2021-05-07 DIAGNOSIS — I952 Hypotension due to drugs: Secondary | ICD-10-CM

## 2021-05-07 DIAGNOSIS — I482 Chronic atrial fibrillation, unspecified: Secondary | ICD-10-CM

## 2021-05-07 LAB — POCT GLYCOSYLATED HEMOGLOBIN (HGB A1C): Hemoglobin A1C: 10.1 % — AB (ref 4.0–5.6)

## 2021-05-07 LAB — GLUCOSE, POCT (MANUAL RESULT ENTRY): POC Glucose: 85 mg/dl (ref 70–99)

## 2021-05-07 MED ORDER — INSULIN LISPRO 100 UNIT/ML IJ SOLN
10.0000 [IU] | Freq: Three times a day (TID) | INTRAMUSCULAR | 6 refills | Status: DC
  Filled 2021-05-07: qty 10, 34d supply, fill #0

## 2021-05-07 MED ORDER — BASAGLAR KWIKPEN 100 UNIT/ML ~~LOC~~ SOPN
30.0000 [IU] | PEN_INJECTOR | Freq: Every day | SUBCUTANEOUS | 6 refills | Status: DC
  Filled 2021-05-07: qty 9, 30d supply, fill #0

## 2021-05-07 NOTE — Patient Instructions (Signed)
Your blood pressure is low.  Hold off on taking lisinopril.  Check your blood pressure at least 3 times a week.  If your top number starts increasing above 130, you can restart the lisinopril at that point.  Increase your Basaglar insulin from 27 units at bedtime to 30 units at bedtime.  Continue to monitor your blood sugar.  Please keep a log.  We will have you follow-up with our clinical pharmacist in about 3 weeks for recheck of your blood sugars.  Please bring your log with you.

## 2021-05-07 NOTE — Progress Notes (Signed)
Patient ID: Emily Farmer, female    DOB: 01-31-1956  MRN: [id number]  CC: Diabetes and Hospitalization Follow-up   Subjective: Emily Farmer is a 65 y.o. female who presents for hosp f/u Her concerns today include:  Patient with history of HTN, HL, PAF, chronic diastolic CHF, severe PHTN with moderate to severe tricuspid regurg DM type II with neuropathy, obesity, depression, IDA, pancreatitis due to TG, Klebsiella sepsis secondary to pyelonephritis   Patient hospitalized 11/5-12/2020.  She presented with dizziness and found to be in atrial fibrillation with RVR.  Patient had not taken her metoprolol that morning.  Cardiac enzymes were negative.  She received a liter of fluid bolus in the ER with slight improvement in her heart rate.  Chest x-ray was negative.  CBC and BMP were okay.  Found to have elevated systolic blood pressure in the 150s to 170s.  Her home blood pressure medications including lisinopril were restarted with improvement in her blood pressure.  Blood sugars were elevated.  Today: Atrial fibrillation: Patient denies any further dizziness.  No palpitations.  She has been taking the Cardizem and metoprolol as prescribed.  She has an upcoming appointment with a cardiologist on the 29th of this month.  Blood pressure today is noted to be low.  Patient last checked her blood pressure 2 days ago when it was 135/87.  She denies any shortness of breath, lower extremity edema or dizziness.  DM: Results for orders placed or performed in visit on 05/07/21  POCT glucose (manual entry)  Result Value Ref Range   POC Glucose 85 70 - 99 mg/dl  POCT glycosylated hemoglobin (Hb A1C)  Result Value Ref Range   Hemoglobin A1C 10.1 (A) 4.0 - 5.6 %   HbA1c POC (<> result, manual entry)     HbA1c, POC (prediabetic range)     HbA1c, POC (controlled diabetic range)     A1c down 2 points compared to 3 months ago.  She reports compliance with Victoza, Lantus insulin 27 units daily, Humalog 10  units with meals and metformin.  Checking blood sugars 3 times a day before meals.  She does not have the logbook with her.  However she tells me that her blood sugars before meals have been in the 200s.  Blood sugar this morning before breakfast was 243. Patient Active Problem List   Diagnosis Date Noted   Bacteremia 04/01/2021   Atrial fibrillation with RVR ([place]) 03/31/2021   Pulmonary hypertension ([place]) 03/19/2021   Hypomagnesemia    Sepsis ([place]) 02/27/2021   Atrial fibrillation with rapid ventricular response ([place]) 02/27/2021   Hypotension 02/27/2021   Secondary hypercoagulable state ([place]) 05/08/2020   Type 2 diabetes mellitus with diabetic polyneuropathy, with long-term current use of insulin ([place]) 09/23/2019   Uncontrolled type 2 diabetes mellitus with hyperglycemia, with long-term current use of insulin ([place]) 09/23/2019   Dyslipidemia 09/23/2019   Toenail fungus 07/19/2018   Depression 07/19/2018   Iron deficiency anemia due to chronic blood loss 07/19/2018   Gastritis and duodenitis 07/19/2018   Chronic a-fib ([place]) 01/28/2018   Hyponatremia 01/28/2018   Chronic diastolic CHF (congestive heart failure) ([place]) 01/28/2018   Mixed hyperlipidemia 06/01/2017   Paroxysmal A-fib ([place]) 10/04/2016   Hypokalemia 01/04/2014   Acute pyelonephritis 02/15/2013   Essential hypertension 02/15/2013   Hypertriglyceridemia 02/15/2013   Diabetes mellitus type 2 in obese ([place]) 02/15/2013   Coronary atherosclerosis seen on CT 02/15/2013     Current Outpatient Medications on File Prior to Visit  Medication  Sig Dispense Refill   acetaminophen (TYLENOL) 325 MG tablet Take 2 tablets (650 mg total) by mouth every 6 (six) hours as needed for mild pain (or Fever >/= 101).     apixaban (ELIQUIS) 5 MG TABS tablet Take 1 tablet (5 mg total) by mouth 2 (two) times daily. 60 tablet 4   atorvastatin (LIPITOR) 40 MG tablet Take 1 tablet (40 mg total) by mouth daily. 90 tablet 3   Blood Glucose Monitoring Suppl  (TRUE METRIX METER) w/Device KIT Use to monitor blood sugar as directed (Patient taking differently: 1 each by Other route See admin instructions. Use to monitor blood sugar as directed) 1 kit 0   diltiazem (CARDIZEM CD) 120 MG 24 hr capsule Take 1 capsule (120 mg total) by mouth daily. 30 capsule 0   ezetimibe (ZETIA) 10 MG tablet Take 1 tablet (10 mg total) by mouth daily. 30 tablet 0   ferrous sulfate 325 (65 FE) MG tablet Take 1 tablet (325 mg total) by mouth daily with breakfast. 30 tablet 4   glucose blood test strip USE AS INSTRUCTED TO CHECK BLOOD SUGARS 3 TIMES DAILY (Patient taking differently: 1 each by Other route See admin instructions. Check blood sugar 4 times daily) 100 each 11   Insulin Syringe-Needle U-100 (TRUEPLUS INSULIN SYRINGE) 31G X 5/16" 0.3 ML MISC Use to inject insulin daily. (Patient taking differently: 1 each by Other route See admin instructions. Use to inject insulin daily.) 100 each 11   liraglutide (VICTOZA) 18 MG/3ML SOPN Inject 1.2 mg into the skin daily. 6 mL 6   metFORMIN (GLUCOPHAGE) 500 MG tablet Take 0.5 tablets (250 mg total) by mouth 4 (four) times daily. 60 tablet 2   metoprolol succinate (TOPROL-XL) 100 MG 24 hr tablet Take 1 tablet (100 mg total) by mouth 2 (two) times daily. Take with or immediately following a meal. 60 tablet 0   Multiple Vitamins-Minerals (CENTRUM SILVER PO) Take 1 tablet by mouth daily.     No current facility-administered medications on file prior to visit.    No Known Allergies  Social History   Socioeconomic History   Marital status: Single    Spouse name: Not on file   Number of children: Not on file   Years of education: Not on file   Highest education level: Not on file  Occupational History   Not on file  Tobacco Use   Smoking status: Never   Smokeless tobacco: Never  Vaping Use   Vaping Use: Never used  Substance and Sexual Activity   Alcohol use: No   Drug use: No   Sexual activity: Not Currently  Other  Topics Concern   Not on file  Social History Narrative   Lives with her sister and does not need any assistance with ADLs.     Social Determinants of Health   Financial Resource Strain: Not on file  Food Insecurity: Not on file  Transportation Needs: Not on file  Physical Activity: Not on file  Stress: Not on file  Social Connections: Not on file  Intimate Partner Violence: Not on file    Family History  Problem Relation Age of Onset   Heart attack Mother    Heart attack Father    Diabetes Sister    High blood pressure Neg Hx    High Cholesterol Neg Hx     Past Surgical History:  Procedure Laterality Date   BIOPSY  06/20/2018   Procedure: BIOPSY;  Surgeon: Clarene Essex, MD;  Location:  [facility];  Service: Endoscopy;;   CHOLECYSTECTOMY N/A 01/04/2014   Procedure: LAPAROSCOPIC CHOLECYSTECTOMY WITH INTRAOPERATIVE CHOLANGIOGRAM;  Surgeon: Ralene Ok, MD;  Location: [facility];  Service: General;  Laterality: N/A;   COLONOSCOPY WITH PROPOFOL N/A 06/21/2018   Procedure: COLONOSCOPY WITH PROPOFOL;  Surgeon: Ronald Lobo, MD;  Location: ;  Service: Endoscopy;  Laterality: N/A;   ESOPHAGOGASTRODUODENOSCOPY (EGD) WITH PROPOFOL N/A 06/20/2018   Procedure: ESOPHAGOGASTRODUODENOSCOPY (EGD) WITH PROPOFOL;  Surgeon: Clarene Essex, MD;  Location: [facility];  Service: Endoscopy;  Laterality: N/A;   LUNG SURGERY      ROS: Review of Systems Negative except as stated above  PHYSICAL EXAM: BP 92/66   Pulse 96   Ht '5\' 3"'  (1.6 m)   Wt 161 lb 4 oz (73.1 kg)   SpO2 96%   BMI 28.56 kg/m   Physical Exam  General appearance - alert, well appearing, and in no distress Mental status - normal mood, behavior, speech, dress, motor activity, and thought processes Mouth: Oral mucosa moist. Chest - clear to auscultation, no wheezes, rales or rhonchi, symmetric air entry Heart -heart rate is irregularly irregular but rate controlled. Extremities -no lower extremity edema.   CMP  Latest Ref Rng & Units 04/22/2021 04/21/2021 04/20/2021  Glucose 70 - 99 mg/dL 245(H) 127(H) 198(H)  BUN 8 - 23 mg/dL 21 23 26(H)  Creatinine 0.44 - 1.00 mg/dL 1.21(H) 0.85 1.14(H)  Sodium 135 - 145 mmol/L 135 137 136  Potassium 3.5 - 5.1 mmol/L 4.1 4.0 3.9  Chloride 98 - 111 mmol/L 100 103 103  CO2 22 - 32 mmol/L 26 24 20(L)  Calcium 8.9 - 10.3 mg/dL 9.4 8.9 8.8(L)  Total Protein 6.0 - 8.5 g/dL - - -  Total Bilirubin 0.0 - 1.2 mg/dL - - -  Alkaline Phos 44 - 121 IU/L - - -  AST 0 - 40 IU/L - - -  ALT 0 - 32 IU/L - - -   Lipid Panel     Component Value Date/Time   CHOL 132 04/21/2021 0143   CHOL 214 (H) 06/05/2020 0837   TRIG 263 (H) 04/21/2021 0143   HDL 33 (L) 04/21/2021 0143   HDL 40 06/05/2020 0837   CHOLHDL 4.0 04/21/2021 0143   VLDL 53 (H) 04/21/2021 0143   LDLCALC 46 04/21/2021 0143   LDLCALC 110 (H) 06/05/2020 0837   LDLDIRECT 137 (H) 03/01/2020 1012   LDLDIRECT 96.0 09/23/2019 0828    CBC    Component Value Date/Time   WBC 7.3 04/20/2021 0928   RBC 4.15 04/20/2021 0928   HGB 11.8 (L) 04/20/2021 0928   HGB 12.6 04/11/2021 1447   HCT 36.6 04/20/2021 0928   HCT 39.0 04/11/2021 1447   PLT 253 04/20/2021 0928   PLT 421 04/11/2021 1447   MCV 88.2 04/20/2021 0928   MCV 87 04/11/2021 1447   MCH 28.4 04/20/2021 0928   MCHC 32.2 04/20/2021 0928   RDW 13.4 04/20/2021 0928   RDW 13.8 04/11/2021 1447   LYMPHSABS 1.6 04/20/2021 0928   LYMPHSABS 1.9 04/11/2021 1447   MONOABS 0.6 04/20/2021 0928   EOSABS 0.2 04/20/2021 0928   EOSABS 0.4 04/11/2021 1447   BASOSABS 0.1 04/20/2021 0928   BASOSABS 0.0 04/11/2021 1447    ASSESSMENT AND PLAN: 1. Chronic atrial fibrillation (HCC) -Continue metoprolol and Cardizem.  Continue Eliquis.  2. Type 2 diabetes mellitus with peripheral neuropathy ([place]) Commended her on slight improvement in her A1c.  Dietary counseling given.  I recommend increasing Lantus insulin  to 30 units daily.  Continue current dose of Humalog, Victoza and  metformin.  To monitor blood sugars.  Have encouraged her to keep a log.  Follow-up with our clinical pharmacist in 3 weeks with her log. - POCT glucose (manual entry) - POCT glycosylated hemoglobin (Hb A1C) - insulin lispro (HUMALOG) 100 UNIT/ML injection; Inject 0.1 mLs (10 Units total) into the skin 3 (three) times daily with meals.  Dispense: 10 mL; Refill: 6 - Insulin Glargine (BASAGLAR KWIKPEN) 100 UNIT/ML; Inject 30 Units into the skin at bedtime.  Dispense: 30 mL; Refill: 6  3. Hypotension due to drugs Patient is asymptomatic.  She does not appear dehydrated.  Discontinue lisinopril for now.  Patient to continue monitoring her blood pressure.    Patient was given the opportunity to ask questions.  Patient verbalized understanding of the plan and was able to repeat key elements of the plan.   Orders Placed This Encounter  Procedures   POCT glucose (manual entry)   POCT glycosylated hemoglobin (Hb A1C)     Requested Prescriptions   Signed Prescriptions Disp Refills   insulin lispro (HUMALOG) 100 UNIT/ML injection 10 mL 6    Sig: Inject 0.1 mLs (10 Units total) into the skin 3 (three) times daily with meals.   Insulin Glargine (BASAGLAR KWIKPEN) 100 UNIT/ML 30 mL 6    Sig: Inject 30 Units into the skin at bedtime.    Return in about 6 weeks (around 06/18/2021) for give appt with [facility] in 3 wks for recheck BS. next appt with me for pap.  Karle Plumber, MD, FACP

## 2021-05-08 ENCOUNTER — Other Ambulatory Visit: Payer: Self-pay

## 2021-05-13 ENCOUNTER — Other Ambulatory Visit: Payer: Self-pay

## 2021-05-13 ENCOUNTER — Other Ambulatory Visit: Payer: Self-pay | Admitting: Student

## 2021-05-13 NOTE — Progress Notes (Signed)
Office Visit    Patient Name: Emily Farmer Date of Encounter: 05/14/2021  PCP:  Emily Pier, MD   [place]  Cardiologist:  Emily Casino, MD  Advanced Practice Provider:  No care team member to display Electrophysiologist:  None     Chief Complaint    Emily Farmer is a 65 y.o. female with a hx of atrial fibrillation HLD, DM2, anemia, pancreatitis, tricuspid regurgitation, mitral regurgitation, dyspnea on exertion, diastolic dysfunction presents today for follow up of atrial fibrillation   Past Medical History    Past Medical History:  Diagnosis Date   Arthritis    Atrial fibrillation ([place])    a. 09/2016 in setting of pancreatitis;  b. 09/2016 Echo: EF 65-60%, no rwma, Gr2 DD, mild MR, triv TR, PASP 67mHg;  CHA2DS2VASc = 4-->Eliquis 566mBID. c. Recurred 06/2018 in setting of GIB.   Chronic diastolic CHF (congestive heart failure) ([place]/15/2019   Diabetes mellitus    GI bleeding 06/2018   Gout    Hyperlipidemia    Hypertension    Hypertriglyceridemia    Pancreatitis    a. 09/2016 - Triglycerides 1,392 on admission.   Past Surgical History:  Procedure Laterality Date   BIOPSY  06/20/2018   Procedure: BIOPSY;  Surgeon: MaClarene EssexMD;  [facility]: [facility];;   CHOLECYSTECTOMY N/A 01/04/2014   Procedure: LAPAROSCOPIC CHOLECYSTECTOMY WITH INTRAOPERATIVE CHOLANGIOGRAM;  Surgeon: ArRalene OkMD;  [facility] Service: General;  Laterality: N/A;   COLONOSCOPY WITH PROPOFOL N/A 06/21/2018   Procedure: COLONOSCOPY WITH PROPOFOL;  Surgeon: BuRonald LoboMD;  [facility] Service: Endoscopy;  Laterality: N/A;   ESOPHAGOGASTRODUODENOSCOPY (EGD) WITH PROPOFOL N/A 06/20/2018   Procedure: ESOPHAGOGASTRODUODENOSCOPY (EGD) WITH PROPOFOL;  Surgeon: MaClarene EssexMD;  [facility] Service: [facility];  Laterality: N/A;   LUNG SURGERY      Allergies  No Known Allergies  History of Present Illness     RoHerberta Pickrons a [age].o. female with a hx of atrial fibrillation HLD, DM2, anemia, pancreatitis, tricuspid regurgitation, mitral regurgitation, dyspnea on exertion, diastolic dysfunction last seen while hospitalized.  She previously had difficult with medication compliance which resolved when she obtained Medicaid coverage.   Last seen by Dr. HiDebara Pickett2/9/21 with persistent dyspnea. A home sleep study was ordered with subsequent recommendation for BIPAP. BIPAP application was sent to [employer] assistance program as her Medicaid did not cover BIPAP.   Recently admitted 02/26/21-03/03/21 with sepsis due to pyelonephritis. Treated with IV abx. Her Metoprolol was titrated to tartrate 5071m6h due to atrial fibrillation with RVR. Echo with mild RV dilation and mildly decreased contraction with associated severe pulmonary hypertension.   She was seen 03/21/21. She was feeling well since discharge. She was mildly hypotensive though asymptomatic and no changes made at that time.   Subsequently admitted 03/31/21 with pyelonephritis and klebsiella sepsis. She was tachycardic and required IV diltiazem for rate control of atrial fibrillation. She was discharged on Toprol 100m80mD. Echo 04/03/21 LVEF 50-55%, RVSF midly reduced, RV mildly enlarged, normal PASP, mild MR, mild to moderate TR, concern for AV vegetation vs degenerative changes.   Readmitted 04/20/21 due to atrial fibrillation with RVR. She had missed a dose of Metoprolol that morning. Cardiology consulted and Diltiazem added.   She presents today for follow up. Tells me since being home form the hospital has not felt palpitations. She notes stable dyspnea on exertion. Most notable  when walking form her apartment to the store with a hill. No chest pain, pressure, tightness. Endorses lightheadedness and near syncope. No syncope. Tells me this is often her sign that her heart rate is elevated.  Tells me she had taken her Diltiazem,  Eliquis, and Metoprolol twice per day. Was unaware Diltiazem was once per day. Takes her morning medications around 5:30am-6am and second dose at 11pm. She goes to work early in the morning and gets home around [place]. She works as a Pension scheme manager. Blood pressure at home routinely in the 01V systolic. HR at home low 100s when checked with BP cuff.   EKGs/Labs/Other Studies Reviewed:   The following studies were reviewed today:  Echo 04/03/21 IMPRESSIONS     1. Left ventricular ejection fraction, by estimation, is 50 to 55%. The  left ventricle has low normal function. Left ventricular endocardial  border not optimally defined to evaluate regional wall motion. Left  ventricular diastolic function could not be  evaluated.   2. Right ventricular systolic function is mildly reduced. The right  ventricular size is mildly enlarged. There is normal pulmonary artery  systolic pressure.   3. Left atrial size was moderately dilated.   4. The mitral valve is normal in structure. Mild mitral valve  regurgitation.   5. Tricuspid valve regurgitation is mild to moderate.   6. Concerning for aortic valve vegetation versus degenerative changes.  There is mild calcification of the aortic valve. There is mild thickening  of the aortic valve.   7. The inferior vena cava is normal in size with <50% respiratory  variability, suggesting right atrial pressure of 8 mmHg.   Conclusion(s)/Recommendation(s): Poor transthoracic image quality.  Recommend TEE for better evaluation of possible aortic valve vegetation.  Echo 02/28/21 IMPRESSIONS   1. Compared to echo from Sept 2021, RV is dilated and function is down;  TR is increased, IVC dilated.   2. Left ventricular ejection fraction, by estimation, is 55 to 60%. The  left ventricle has normal function. The left ventricle has no regional  wall motion abnormalities. Left ventricular diastolic parameters are  indeterminate.   3. Right ventricular systolic  function is mildly reduced. The right  ventricular size is mildly enlarged. Mildly increased right ventricular  wall thickness. There is severely elevated pulmonary artery systolic  pressure.   4. Mild mitral valve regurgitation.   5. Tricuspid valve regurgitation is moderate to severe.   6. The aortic valve is tricuspid. Aortic valve regurgitation is not  visualized. Mild to moderate aortic valve sclerosis/calcification is  present, without any evidence of aortic stenosis.   7. The inferior vena cava is dilated in size with <50% respiratory  variability, suggesting right atrial pressure of 15 mmHg.   EKG:  EKG ordered today demonstrates atrial fibrillation with RVR 119 bpm with no acute ST/t wave changes.   Recent Labs: 03/30/2021: B Natriuretic Peptide 189.8 04/11/2021: ALT 9 04/20/2021: Hemoglobin 11.8; Platelets 253 04/21/2021: TSH 0.633 04/22/2021: BUN 21; Creatinine, Ser 1.21; Magnesium 2.1; Potassium 4.1; Sodium 135  Recent Lipid Panel    Component Value Date/Time   CHOL 132 04/21/2021 0143   CHOL 214 (H) 06/05/2020 0837   TRIG 263 (H) 04/21/2021 0143   HDL 33 (L) 04/21/2021 0143   HDL 40 06/05/2020 0837   CHOLHDL 4.0 04/21/2021 0143   VLDL 53 (H) 04/21/2021 0143   LDLCALC 46 04/21/2021 0143   LDLCALC 110 (H) 06/05/2020 0837   LDLDIRECT 137 (H) 03/01/2020 1012   LDLDIRECT 96.0  09/23/2019 0828    Risk Assessment/Calculations:   CHA2DS2-VASc Score = 4  This indicates a 4.8% annual risk of stroke. The patient's score is based upon: CHF History: 1 HTN History: 1 Diabetes History: 1 Stroke History: 0 Vascular Disease History: 0 Age Score: 0 Gender Score: 1    Home Medications   Current Meds  Medication Sig   acetaminophen (TYLENOL) 325 MG tablet Take 2 tablets (650 mg total) by mouth every 6 (six) hours as needed for mild pain (or Fever >/= 101).   apixaban (ELIQUIS) 5 MG TABS tablet Take 1 tablet (5 mg total) by mouth 2 (two) times daily.   atorvastatin  (LIPITOR) 40 MG tablet Take 1 tablet (40 mg total) by mouth daily.   Blood Glucose Monitoring Suppl (TRUE METRIX METER) w/Device KIT Use to monitor blood sugar as directed (Patient taking differently: 1 each by Other route See admin instructions. Use to monitor blood sugar as directed)   diltiazem (CARDIZEM CD) 120 MG 24 hr capsule Take 1 capsule (120 mg total) by mouth daily.   ezetimibe (ZETIA) 10 MG tablet Take 1 tablet (10 mg total) by mouth daily.   ferrous sulfate 325 (65 FE) MG tablet Take 1 tablet (325 mg total) by mouth daily with breakfast.   glucose blood test strip USE AS INSTRUCTED TO CHECK BLOOD SUGARS 3 TIMES DAILY (Patient taking differently: 1 each by Other route See admin instructions. Check blood sugar 4 times daily)   Insulin Glargine (BASAGLAR KWIKPEN) 100 UNIT/ML Inject 30 Units into the skin at bedtime.   insulin lispro (HUMALOG) 100 UNIT/ML injection Inject 0.1 mLs (10 Units total) into the skin 3 (three) times daily with meals.   Insulin Syringe-Needle U-100 (TRUEPLUS INSULIN SYRINGE) 31G X 5/16" 0.3 ML MISC Use to inject insulin daily. (Patient taking differently: 1 each by Other route See admin instructions. Use to inject insulin daily.)   liraglutide (VICTOZA) 18 MG/3ML SOPN Inject 1.2 mg into the skin daily.   metFORMIN (GLUCOPHAGE) 500 MG tablet Take 0.5 tablets (250 mg total) by mouth 4 (four) times daily.   metoprolol succinate (TOPROL-XL) 100 MG 24 hr tablet Take 1 tablet (100 mg total) by mouth 2 (two) times daily. Take with or immediately following a meal.   Multiple Vitamins-Minerals (CENTRUM SILVER PO) Take 1 tablet by mouth daily.     Review of Systems      All other systems reviewed and are otherwise negative except as noted above.  Physical Exam    VS:  BP (!) 84/64   Pulse (!) 119   Ht _0  (1.6 m)   Wt 161 lb (73 kg)   BMI 28.52 kg/m  , BMI Body mass index is 28.52 kg/m.  Wt Readings from Last 3 Encounters:  05/14/21 161 lb (73 kg)  05/07/21  161 lb 4 oz (73.1 kg)  04/20/21 167 lb 1.7 oz (75.8 kg)    GEN: Well nourished, well developed, in no acute distress. HEENT: normal. Neck: Supple, no JVD, carotid bruits, or masses. Cardiac: irregularly irregular, no murmurs, rubs, or gallops. No clubbing, cyanosis, edema.  Radials/PT 2+ and equal bilaterally.  Respiratory:  Respirations regular and unlabored, clear to auscultation bilaterally. GI: Soft, nontender, nondistended. MS: No deformity or atrophy. Skin: Warm and dry, no rash. Neuro:  Strength and sensation are intact. Psych: Normal affect.  Assessment & Plan    Permanent atrial fibrillation / Chronic anticoagulation - Initially not rate controlled by EK though HR improved with sitting in  office. Continue Toprol 166m twice daily. Will refill today. Continue Eliquis 542mBID. Denies bleeding complications. CHA2DS2-VASc Score = 4 [CHF History: 1, HTN History: 1, Diabetes History: 1, Stroke History: 0, Vascular Disease History: 0, Age Score: 0, Gender Score: 1].  Therefore, the patient's annual risk of stroke is 4.8 %.   Does not meet reduced dose criteria. Diltiazem 1202mD was prescribed at discharge but has been taking BID. Given hypotension and lightheadedness, reduce to 120m37m. Monitor daily heart rate at home and report consistently >100bpm. Given persistent difficulty with rate control, will refer to EP.  HTN - Now with hypotension. Likely due to Diltiazem BID instead of QD as prescribed. Reduce Diltiazem to 120mg34m Encouraged to make position changes slowly and keep a log of BP at home.   DM2 - Not well controlled with 02/27/21 A1c 12.8. 05/07/21 A1c 10.1. Continue to follow with PCP.   HLD - Continue Zetia and Atorvastatin.   HFpEF / Pulmonary hypertension - Euvolemic and well compensated on exam. NYHA II.   If dyspnea worsens, could consider low dose daily Lasix with careful monitoring of BP. If persistent of new dyspnea, consider PFT. No indication for cardiac  catheterization at this time.   OSA - Recommend compliance with BIPAP  Valvular heart disease - Continue optimal BP and volume control. Echo 04/03/21 LVEF 50-55%, RVSF mildly reduced, RV mildly enlarged, LA moderatel dilated, mild MR, mild to moderate TR, aortic valve vegetation vs degenerative changes. Recommendation was made for TEE for better evaluation of possible aortic valve vegetation. No further imaging collected. Will discuss with primary cardiologist whether to pursue TEE or limited echo now that she is  >1 month from discharge.   Disposition: Follow up in February as scheduled.   Signed, CaitlLoel Dubonnet11/29/2022, 2:45 PM [facility]

## 2021-05-14 ENCOUNTER — Ambulatory Visit (INDEPENDENT_AMBULATORY_CARE_PROVIDER_SITE_OTHER): Payer: Self-pay | Admitting: Family

## 2021-05-14 ENCOUNTER — Other Ambulatory Visit: Payer: Self-pay

## 2021-05-14 ENCOUNTER — Encounter (HOSPITAL_BASED_OUTPATIENT_CLINIC_OR_DEPARTMENT_OTHER): Payer: Self-pay | Admitting: Family

## 2021-05-14 VITALS — BP 84/64 | HR 119 | Ht 63.0 in | Wt 161.0 lb

## 2021-05-14 DIAGNOSIS — E1165 Type 2 diabetes mellitus with hyperglycemia: Secondary | ICD-10-CM

## 2021-05-14 DIAGNOSIS — I959 Hypotension, unspecified: Secondary | ICD-10-CM

## 2021-05-14 DIAGNOSIS — D6869 Other thrombophilia: Secondary | ICD-10-CM

## 2021-05-14 DIAGNOSIS — Z794 Long term (current) use of insulin: Secondary | ICD-10-CM

## 2021-05-14 DIAGNOSIS — I4821 Permanent atrial fibrillation: Secondary | ICD-10-CM

## 2021-05-14 DIAGNOSIS — I10 Essential (primary) hypertension: Secondary | ICD-10-CM

## 2021-05-14 DIAGNOSIS — I4891 Unspecified atrial fibrillation: Secondary | ICD-10-CM

## 2021-05-14 MED ORDER — DILTIAZEM HCL ER COATED BEADS 120 MG PO CP24
120.0000 mg | ORAL_CAPSULE | Freq: Every day | ORAL | 3 refills | Status: DC
  Filled 2021-05-14: qty 30, 30d supply, fill #0
  Filled 2021-06-09: qty 30, 30d supply, fill #1
  Filled 2021-07-23: qty 30, 30d supply, fill #0
  Filled 2021-09-03: qty 30, 30d supply, fill #1
  Filled 2021-10-15: qty 30, 30d supply, fill #2
  Filled 2021-12-02 – 2021-12-09 (×2): qty 30, 30d supply, fill #3
  Filled 2022-01-23: qty 30, 30d supply, fill #4

## 2021-05-14 MED ORDER — METOPROLOL SUCCINATE ER 100 MG PO TB24
100.0000 mg | ORAL_TABLET | Freq: Two times a day (BID) | ORAL | 3 refills | Status: DC
  Filled 2021-05-14: qty 60, 30d supply, fill #0
  Filled 2021-06-09: qty 60, 30d supply, fill #1
  Filled 2021-12-02 – 2021-12-09 (×2): qty 60, 30d supply, fill #0

## 2021-05-14 NOTE — Patient Instructions (Signed)
Medication Instructions:  Your physician has recommended you make the following change in your medication:   Reduce: Diltiazem (Cardizem CD) 120 MG 24hr Capsule to 1 time per day   *If you need a refill on your cardiac medications before your next appointment, please call your pharmacy*   Lab Work: None ordered today   Testing/Procedures: None ordered today    Follow-Up: At [facility], you and your health needs are our priority.  As part of our continuing mission to provide you with exceptional heart care, we have created designated Provider Care Teams.  These Care Teams include your primary Cardiologist (physician) and Advanced Practice Providers (APPs -  Physician Assistants and Nurse Practitioners) who all work together to provide you with the care you need, when you need it.  We recommend signing up for the patient portal called "MyChart".  Sign up information is provided on this After Visit Summary.  MyChart is used to connect with patients for Virtual Visits (Telemedicine).  Patients are able to view lab/test results, encounter notes, upcoming appointments, etc.  Non-urgent messages can be sent to your provider as well.   To learn more about what you can do with MyChart, go to [email or web address].    Your next appointment:   Please Keep your Scheduled appointment with Gillian Shields on 2/6 @ 2:45pm  The format for your next appointment:   In Person  Provider:   Gillian Shields, NP    Other Instructions A referral to Electrophysiology has been sent for you today

## 2021-05-16 ENCOUNTER — Encounter: Payer: Self-pay | Admitting: Internal Medicine

## 2021-05-16 ENCOUNTER — Other Ambulatory Visit: Payer: Self-pay

## 2021-05-16 ENCOUNTER — Ambulatory Visit (INDEPENDENT_AMBULATORY_CARE_PROVIDER_SITE_OTHER): Payer: Medicaid Other | Admitting: Internal Medicine

## 2021-05-16 VITALS — BP 112/78 | HR 64 | Ht 63.0 in | Wt 159.8 lb

## 2021-05-16 DIAGNOSIS — E1165 Type 2 diabetes mellitus with hyperglycemia: Secondary | ICD-10-CM

## 2021-05-16 DIAGNOSIS — Z794 Long term (current) use of insulin: Secondary | ICD-10-CM

## 2021-05-16 DIAGNOSIS — E1142 Type 2 diabetes mellitus with diabetic polyneuropathy: Secondary | ICD-10-CM

## 2021-05-16 LAB — MICROALBUMIN / CREATININE URINE RATIO
Creatinine,U: 83.9 mg/dL
Microalb Creat Ratio: 15.2 mg/g (ref 0.0–30.0)
Microalb, Ur: 12.7 mg/dL — ABNORMAL HIGH (ref 0.0–1.9)

## 2021-05-16 MED ORDER — EMPAGLIFLOZIN 10 MG PO TABS
10.0000 mg | ORAL_TABLET | Freq: Every day | ORAL | 1 refills | Status: DC
  Filled 2021-05-16: qty 30, 30d supply, fill #0
  Filled 2021-06-09: qty 30, 30d supply, fill #1
  Filled 2021-10-15: qty 30, 30d supply, fill #0

## 2021-05-16 MED ORDER — INSULIN LISPRO (1 UNIT DIAL) 100 UNIT/ML (KWIKPEN)
PEN_INJECTOR | SUBCUTANEOUS | 6 refills | Status: DC
  Filled 2021-05-16 – 2021-10-15 (×2): qty 15, 25d supply, fill #0

## 2021-05-16 MED ORDER — INSULIN PEN NEEDLE 32G X 4 MM MISC
1.0000 | Freq: Four times a day (QID) | 3 refills | Status: DC
  Filled 2021-05-16 – 2021-10-15 (×2): qty 100, 25d supply, fill #0
  Filled 2022-01-23: qty 100, 25d supply, fill #1

## 2021-05-16 MED ORDER — DEXCOM G6 TRANSMITTER MISC: 1.0000 | 3 refills | Status: DC

## 2021-05-16 MED ORDER — EMPAGLIFLOZIN 10 MG PO TABS: 10.0000 mg | ORAL_TABLET | Freq: Every day | ORAL | 1 refills | Status: DC

## 2021-05-16 MED ORDER — LIRAGLUTIDE 18 MG/3ML ~~LOC~~ SOPN
1.2000 mg | PEN_INJECTOR | Freq: Every day | SUBCUTANEOUS | 3 refills | Status: DC
  Filled 2021-05-16 – 2021-10-15 (×2): qty 6, 30d supply, fill #0

## 2021-05-16 MED ORDER — DEXCOM G6 SENSOR MISC: 1.0000 | 11 refills | Status: DC

## 2021-05-16 MED ORDER — BASAGLAR KWIKPEN 100 UNIT/ML ~~LOC~~ SOPN
30.0000 [IU] | PEN_INJECTOR | Freq: Every day | SUBCUTANEOUS | 3 refills | Status: DC
  Filled 2021-05-16: qty 30, 100d supply, fill #0
  Filled 2021-05-27: qty 9, 30d supply, fill #0

## 2021-05-16 NOTE — Progress Notes (Signed)
Name: Emily Farmer  Age/ Sex: 65 y.o., female   MRN/ DOB: [id number], 1955/09/04     PCP: Ladell Pier, MD   Reason for Endocrinology Evaluation: Type 2 Diabetes Mellitus  Initial Endocrine Consultative Visit: 09/26/2019    PATIENT IDENTIFIER: [name] is a 65 y.o. female with a past medical history of DM, HTN, A.Fib  and dyslipidemia. The patient has followed with Endocrinology clinic since 09/26/2019 for consultative assistance with management of her diabetes.  DIABETIC HISTORY:  [name] was diagnosed with DM in 2013, has been on insulin and metformin since diagnosis. Her hemoglobin A1c has ranged from 9.5% in 2020, peaking at 12.2% in 2019    On initial visit to our clinic, her A1c was 12.4% . 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 is here for a follow up on diabetes management. The pt has NOT been to our clinic in 15 months.   She checks her blood sugars 2 times daily. The patient has not had hypoglycemic episodes since the last clinic visit.  She continue  with Drinking sweet  Tea in the morning.  Eats 3 meals a day  Denies nausea , vomiting or diarrhea       HOME DIABETES REGIMEN:  Lantus 30 units daily Humalog 10 units 3 times daily before every meal Metformin 500 mg , half a tablet BID Victoza 1.2 mg daily     Statin: Yes ACE-I/ARB: no  METER DOWNLOAD SUMMARY:Unable to download  Bg's 200-400  mg/dL           DIABETIC COMPLICATIONS: Microvascular complications:  Fluctuating GFR, cataract, neuropathy  Last eye exam: Completed 06/2020   Macrovascular complications:    Denies: CAD, PVD, CVA      HISTORY:  Past Medical History:  Past Medical History:  Diagnosis Date   Arthritis    Atrial fibrillation ([place])    a. 09/2016 in setting of pancreatitis;   b. 09/2016 Echo: EF 65-60%, no rwma, Gr2 DD, mild MR, triv TR, PASP 24mHg;  CHA2DS2VASc = 4-->Eliquis 547mBID. c. Recurred 06/2018 in setting of GIB.   Chronic diastolic CHF ([place]) [place]   Diabetes mellitus    GI bleeding 06/2018   Gout    Hyperlipidemia    Hypertension    Hypertriglyceridemia    Pancreatitis    a. 09/2016 - Triglycerides 1,392 on admission.   Past Surgical History:  Past Surgical History:  Procedure Laterality Date   BIOPSY  06/20/2018   Procedure: BIOPSY;  Surgeon: MaClarene EssexMD;  [facility]: [facility]oscopy;;   CHOLECYSTECTOMY N/A 01/04/2014   Procedure: LAPAROSCOPIC CHOLECYSTECTOMY WITH INTRAOPERATIVE CHOLANGIOGRAM;  Surgeon: ArRalene OkMD;  [facility]: General;  Laterality: N/A;   COLONOSCOPY WITH PROPOFOL N/A 06/21/2018   Procedure: COLONOSCOPY WITH PROPOFOL;  Surgeon: BuRonald LoboMD;  [facility]: Endoscopy;  Laterality: N/A;   ESOPHAGOGASTRODUODENOSCOPY (EGD) WITH PROPOFOL N/A 06/20/2018   Procedure: ESOPHAGOGASTRODUODENOSCOPY (EGD) WITH PROPOFOL;  Surgeon: MaClarene EssexMD;  [facility];  Laterality: N/A;   LUNG SURGERY     Social History:  reports that she has never smoked. She has never used smokeless tobacco. She reports that she does not drink alcohol and does not  use drugs. Family History:  Family History  Problem Relation Age of Onset   Heart attack Mother    Heart attack Father    Diabetes Sister    High blood pressure Neg Hx    High Cholesterol Neg Hx      HOME MEDICATIONS: Allergies as of 05/16/2021   No Known Allergies      Medication List        Accurate as of May 16, 2021  9:07 AM. If you have any questions, ask your nurse or doctor.          acetaminophen 325 MG tablet Commonly known as: TYLENOL Take 2 tablets (650 mg total) by mouth every 6 (six) hours as needed for mild pain (or Fever >/= 101).    atorvastatin 40 MG tablet Commonly known as: LIPITOR Take 1 tablet (40 mg total) by mouth daily.   Basaglar KwikPen 100 UNIT/ML Inject 30 Units into the skin at bedtime.   CENTRUM SILVER PO Take 1 tablet by mouth daily.   Dexcom G6 Sensor Misc 1 Device by Does not apply route as directed. Started by: Dorita Sciara, MD   Dexcom G6 Transmitter Misc 1 Device by Does not apply route as directed. Started by: Dorita Sciara, MD   diltiazem 120 MG 24 hr capsule Commonly known as: CARDIZEM CD Take 1 capsule (120 mg total) by mouth daily.   Eliquis 5 MG Tabs tablet Generic drug: apixaban Take 1 tablet (5 mg total) by mouth 2 (two) times daily.   empagliflozin 10 MG Tabs tablet Commonly known as: Jardiance Take 1 tablet (10 mg total) by mouth daily before breakfast. Started by: Dorita Sciara, MD   ezetimibe 10 MG tablet Commonly known as: Zetia Take 1 tablet (10 mg total) by mouth daily.   FeroSul 325 (65 FE) MG tablet Generic drug: ferrous sulfate Take 1 tablet (325 mg total) by mouth daily with breakfast.   glucose blood test strip USE AS INSTRUCTED TO CHECK BLOOD SUGARS 3 TIMES DAILY What changed:  how much to take how to take this when to take this additional instructions   insulin lispro 100 UNIT/ML injection Commonly known as: HUMALOG Inject 0.1 mLs (10 Units total) into the skin 3 (three) times daily with meals.   Insulin Syringe-Needle U-100 31G X 5/16" 0.3 ML Misc Commonly known as: TRUEplus Insulin Syringe Use to inject insulin daily. What changed:  how much to take how to take this when to take this   metFORMIN 500 MG tablet Commonly known as: GLUCOPHAGE Take 0.5 tablets (250 mg total) by mouth 4 (four) times daily.   metoprolol succinate 100 MG 24 hr tablet Commonly known as: TOPROL-XL Take 1 tablet (100 mg total) by mouth 2 (two) times daily. Take with or immediately following a meal.   True Metrix Meter w/Device Kit Use to  monitor blood sugar as directed What changed:  how much to take how to take this when to take this   Victoza 18 MG/3ML Sopn Generic drug: liraglutide Inject 1.2 mg into the skin daily.         OBJECTIVE:   Vital Signs: BP 112/78 (BP Location: Left Arm, Patient Position: Sitting, Cuff Size: Small)   Pulse 64   Ht _0  (1.6 m)   Wt 159 lb 12.8 oz (72.5 kg)   SpO2 92%   BMI 28.31 kg/m   Wt Readings from Last 3 Encounters:  05/16/21 159 lb 12.8 oz (72.5 kg)  05/14/21  161 lb (73 kg)  05/07/21 161 lb 4 oz (73.1 kg)     Exam: General: Pt appears well and is in NAD  Lungs: Clear with good BS bilat with no rales, rhonchi, or wheezes  Heart: RRR with normal S1 and S2 and no gallops; no murmurs; no rub  Extremities: No pretibial edema.   Neuro: MS is good with appropriate affect, pt is alert and Ox3   DM foot exam: 05/16/2021   The skin of the feet is intact without sores or ulcerations, but has thickened and discolored toe nails The pedal pulses are 2+ on right and 2+ on left. The sensation is decreased to a screening 5.07, 10 gram monofilament bilaterally  DATA REVIEWED:  Lab Results  Component Value Date   HGBA1C 10.1 (A) 05/07/2021   HGBA1C 12.8 (H) 02/27/2021   HGBA1C 14.6 (A) 03/12/2020    Latest Reference Range & Units 04/22/21 03:41 04/22/21 11:25  Sodium 135 - 145 mmol/L 135   Potassium 3.5 - 5.1 mmol/L 4.1   Chloride 98 - 111 mmol/L 100   CO2 22 - 32 mmol/L 26   Glucose 70 - 99 mg/dL 245 (H)   BUN 8 - 23 mg/dL 21   Creatinine 0.44 - 1.00 mg/dL 1.21 (H)   Calcium 8.9 - 10.3 mg/dL 9.4   Anion gap 5 - 15  9   Magnesium 1.7 - 2.4 mg/dL 1.5 (L) 2.1  GFR, Estimated >60 mL/min 50 (L)     Latest Reference Range & Units 04/21/21 01:43  Total CHOL/HDL Ratio RATIO 4.0  Cholesterol 0 - 200 mg/dL 132  HDL Cholesterol >40 mg/dL 33 (L)  LDL (calc) 0 - 99 mg/dL 46  Triglycerides <150 mg/dL 263 (H)  VLDL 0 - 40 mg/dL 53 (H)    Latest Reference Range & Units  05/16/21 09:32  Creatinine,U mg/dL 83.9  Microalb, Ur 0.0 - 1.9 mg/dL 12.7 (H)  MICROALB/CREAT RATIO 0.0 - 30.0 mg/g 15.2  (H): Data is abnormally high  ASSESSMENT / PLAN / RECOMMENDATIONS:   1) Type 2 Diabetes Mellitus, Poorly controlled, With neuropathic complications - Most recent A1c of 10.1 %. Goal A1c < 7.0 %.    -Patient continues with hyperglycemia , we again discussed the importance of low carb diet and avoiding sugar- sweetened beverages  - Will increase HUmalog as below and will provide her with correction scale as well  - Will stop Metformin  - Will start Jardiance, cautioned against Genital infections  - Prescribed dexcom     MEDICATIONS: - Stop Metformin  - Start Jardiace 10 mg daily  - Continue Lantus 30  units daily  - Increase Humalog 12 units with EACH meal  - Continue Victoza 1.2 mg daily  - Correction scale : HUmalog (BG -130/30)     EDUCATION / INSTRUCTIONS: BG monitoring instructions: Patient is instructed to check her blood sugars 3 times a day, before meals. Call [facility] if: BG persistently < 70 or > 300. I reviewed the Rule of 15 for the treatment of hypoglycemia in detail with the patient. Literature supplied.    2) Diabetic complications:  Eye: Does not have known diabetic retinopathy.  Neuro/ Feet: Does have known diabetic peripheral neuropathy. Renal: Patient does have known baseline CKD. She is not on an ACEI/ARB at present.     F/U in 4 months   Signed electronically by: Mack Guise, MD  [facility]  [address] [place],  [place] [address] Phone: [phone] FAX: [phone]   CC: Ladell Pier, MD [place] [address] Phone: [phone]  Fax: [phone]  Return to Endocrinology clinic as below: Future Appointments  Date Time Provider Power  05/28/2021  3:30 PM Tresa Endo, RPH-CPP [facility]  None  06/18/2021  2:50 PM Ladell Pier, MD [facility] None  07/22/2021  2:45 PM Loel Dubonnet, NP [facility]

## 2021-05-16 NOTE — Patient Instructions (Signed)
-   Stop Metformin  - Continue  Lantus 30 units daily  - Increase Humalog 12 units with EACH meal  - Continue Victoza 1.2 mg daily  - Start Jardiance 10 mg, 1 tablet  daily  Humalog correctional insulin: ADD extra units on insulin to your meal-time Humalog dose if your blood sugars are higher than 160. Use the scale below to help guide you:   Blood sugar before meal Number of units to inject  Less than 160 0 unit  161 -  190 1 units  191 -  220 2 units  221 -  250 3 units  251 -  280 4 units  281 -  310 5 units  311 -  340 6 units  341 -  370 7 units  371 -  400 8 units       HOW TO TREAT LOW BLOOD SUGARS (Blood sugar LESS THAN 70 MG/DL) Please follow the RULE OF 15 for the treatment of hypoglycemia treatment (when your (blood sugars are less than 70 mg/dL)   STEP 1: Take 15 grams of carbohydrates when your blood sugar is low, which includes:  3-4 GLUCOSE TABS  OR 3-4 OZ OF JUICE OR REGULAR SODA OR ONE TUBE OF GLUCOSE GEL    STEP 2: RECHECK blood sugar in 15 MINUTES STEP 3: If your blood sugar is still low at the 15 minute recheck --> then, go back to STEP 1 and treat AGAIN with another 15 grams of carbohydrates.

## 2021-05-17 ENCOUNTER — Encounter: Payer: Self-pay | Admitting: Internal Medicine

## 2021-05-20 ENCOUNTER — Other Ambulatory Visit: Payer: Self-pay

## 2021-05-21 ENCOUNTER — Other Ambulatory Visit: Payer: Self-pay

## 2021-05-22 ENCOUNTER — Other Ambulatory Visit: Payer: Self-pay

## 2021-05-22 ENCOUNTER — Encounter: Payer: Self-pay | Admitting: Cardiology

## 2021-05-22 ENCOUNTER — Ambulatory Visit (INDEPENDENT_AMBULATORY_CARE_PROVIDER_SITE_OTHER): Payer: Self-pay | Admitting: Cardiology

## 2021-05-22 VITALS — BP 150/84 | HR 138 | Ht 63.0 in | Wt 162.6 lb

## 2021-05-22 DIAGNOSIS — I5032 Chronic diastolic (congestive) heart failure: Secondary | ICD-10-CM

## 2021-05-22 DIAGNOSIS — I4819 Other persistent atrial fibrillation: Secondary | ICD-10-CM

## 2021-05-22 DIAGNOSIS — G4733 Obstructive sleep apnea (adult) (pediatric): Secondary | ICD-10-CM

## 2021-05-22 DIAGNOSIS — I330 Acute and subacute infective endocarditis: Secondary | ICD-10-CM

## 2021-05-22 DIAGNOSIS — I272 Pulmonary hypertension, unspecified: Secondary | ICD-10-CM

## 2021-05-22 DIAGNOSIS — I10 Essential (primary) hypertension: Secondary | ICD-10-CM

## 2021-05-22 MED ORDER — DIGOXIN 125 MCG PO TABS
0.1250 mg | ORAL_TABLET | Freq: Every day | ORAL | 3 refills | Status: DC
  Filled 2021-05-22: qty 30, 30d supply, fill #0
  Filled 2021-06-09: qty 30, 30d supply, fill #1

## 2021-05-22 NOTE — Progress Notes (Signed)
Electrophysiology Office Note:    Date:  05/22/2021   ID:  Emily Farmer, DOB May 07, 1956, MRN [id number]  PCP:  Ladell Pier, MD  [facility] Cardiologist:  Pixie Casino, MD  [facility] Electrophysiologist:  None   Referring MD: Loel Dubonnet, NP   Chief Complaint: AF  History of Present Illness:    Emily Farmer is a 65 y.o. female who presents for an evaluation of AF at the request of Laurann Montana, NP. Their medical history includes diabetes, pancreatitis, obstructive sleep apnea on BiPAP, hyperlipidemia, GI bleeding.  Patient last saw Laurann Montana on May 14, 2021.  She was hospitalized in early November with A. fib with RVR.  Diltiazem was added to her metoprolol to help achieve better rate control.  She is referred to discuss rate control options.  She was previously treated with amiodarone but this was stopped because of bradycardia.  She is on Eliquis for an elevated CHA2DS2-VASc.    She is minimally symptomatic with her atrial fibrillation.  She tells me that when her heart rates get very high she can have a painful sensation in the back of her neck.  No lightheadedness or dizziness.  No syncope.  She is never taken digoxin before.  She takes Eliquis without any bleeding issues.       Past Medical History:  Diagnosis Date   Arthritis    Atrial fibrillation ([place])    a. 09/2016 in setting of pancreatitis;  b. 09/2016 Echo: EF 65-60%, no rwma, Gr2 DD, mild MR, triv TR, PASP 33mHg;  CHA2DS2VASc = 4-->Eliquis 56mBID. c. Recurred 06/2018 in setting of GIB.   Chronic diastolic CHF (congestive heart failure) [place]   Diabetes mellitus    GI bleeding 06/2018   Gout    Hyperlipidemia    Hypertension    Hypertriglyceridemia    Pancreatitis    a. 09/2016 - Triglycerides 1,392 on admission.    Past Surgical History:  Procedure Laterality Date   BIOPSY  06/20/2018   Procedure: BIOPSY;  Surgeon: MaClarene EssexMD;  [facility]: Endoscopy;;   CHOLECYSTECTOMY N/A 01/04/2014   Procedure: LAPAROSCOPIC CHOLECYSTECTOMY WITH INTRAOPERATIVE CHOLANGIOGRAM;  Surgeon: ArRalene OkMD;  [facility]: General;  Laterality: N/A;   COLONOSCOPY WITH PROPOFOL N/A 06/21/2018   Procedure: COLONOSCOPY WITH PROPOFOL;  Surgeon: BuRonald LoboMD;  [facility]: [facility]: Endoscopy;  Laterality: N/A;   ESOPHAGOGASTRODUODENOSCOPY (EGD) WITH PROPOFOL N/A 06/20/2018   Procedure: ESOPHAGOGASTRODUODENOSCOPY (EGD) WITH PROPOFOL;  Surgeon: MaClarene EssexMD;  [facility]: Endoscopy;  Laterality: N/A;   LUNG SURGERY      Current Medications: Current Meds  Medication Sig   acetaminophen (TYLENOL) 325 MG tablet Take 2 tablets (650 mg total) by mouth every 6 (six) hours as needed for mild pain (or Fever >/= 101).   apixaban (ELIQUIS) 5 MG TABS tablet Take 1 tablet (5 mg total) by mouth 2 (two) times daily.   atorvastatin (LIPITOR) 40 MG tablet Take 1 tablet (40 mg total) by mouth daily.   Blood Glucose Monitoring Suppl (TRUE METRIX METER) w/Device KIT Use to monitor blood sugar as directed (Patient taking differently: 1 each by Other route See admin instructions. Use to monitor blood sugar as directed)   Continuous Blood Gluc Sensor (DEXCOM G6 SENSOR) MISC 1 Device by Does not apply route as directed.   Continuous Blood Gluc Transmit (DEXCOM G6 TRANSMITTER) MISC 1 Device by Does not apply route  as directed.   diltiazem (CARDIZEM CD) 120 MG 24 hr capsule Take 1 capsule (120 mg total) by mouth daily.   empagliflozin (JARDIANCE) 10 MG TABS tablet Take 1 tablet (10 mg total) by mouth daily before breakfast.   ezetimibe (ZETIA) 10 MG tablet Take 1 tablet (10 mg total) by mouth daily.   ferrous sulfate 325 (65 FE) MG tablet Take 1 tablet (325 mg total) by mouth daily with breakfast.   glucose blood test strip USE AS INSTRUCTED TO CHECK BLOOD SUGARS 3 TIMES DAILY (Patient taking differently: 1 each by  Other route See admin instructions. Check blood sugar 4 times daily)   Insulin Glargine (BASAGLAR KWIKPEN) 100 UNIT/ML Inject 30 Units into the skin at bedtime.   Insulin Glargine (BASAGLAR KWIKPEN) 100 UNIT/ML Inject 30 Units into the skin at bedtime.   insulin lispro (HUMALOG KWIKPEN) 100 UNIT/ML KwikPen Max daily 60 units   Insulin Pen Needle 32G X 4 MM MISC use as directed in the morning, at noon, in the evening, and at bedtime.   Insulin Syringe-Needle U-100 (TRUEPLUS INSULIN SYRINGE) 31G X 5/16" 0.3 ML MISC Use to inject insulin daily. (Patient taking differently: 1 each by Other route See admin instructions. Use to inject insulin daily.)   liraglutide (VICTOZA) 18 MG/3ML SOPN Inject 1.2 mg into the skin daily.   metFORMIN (GLUCOPHAGE) 500 MG tablet Take 0.5 tablets (250 mg total) by mouth 4 (four) times daily.   metoprolol succinate (TOPROL-XL) 100 MG 24 hr tablet Take 1 tablet (100 mg total) by mouth 2 (two) times daily. Take with or immediately following a meal.   Multiple Vitamins-Minerals (CENTRUM SILVER PO) Take 1 tablet by mouth daily.     Allergies:   Patient has no known allergies.   Social History   Socioeconomic History   Marital status: Single    Spouse name: Not on file   Number of children: Not on file   Years of education: Not on file   Highest education level: Not on file  Occupational History   Not on file  Tobacco Use   Smoking status: Never   Smokeless tobacco: Never  Vaping Use   Vaping Use: Never used  Substance and Sexual Activity   Alcohol use: No   Drug use: No   Sexual activity: Not Currently  Other Topics Concern   Not on file  Social History Narrative   Lives with her sister and does not need any assistance with ADLs.     Social Determinants of Health   Financial Resource Strain: Not on file  Food Insecurity: Not on file  Transportation Needs: Not on file  Physical Activity: Not on file  Stress: Not on file  Social Connections: Not on file      Family History: The patient's family history includes Diabetes in her sister; Heart attack in her father and mother. There is no history of High blood pressure or High Cholesterol.  ROS:   Please see the history of present illness.    All other systems reviewed and are negative.  EKGs/Labs/Other Studies Reviewed:    The following studies were reviewed today:  September 02, 2018 EKG shows sinus rhythm EKGs since then show AF   April 03, 2021 echo Left ventricular function normal, 50 to 55% Right ventricular function mildly reduced Moderately dilated left atrium Mild MR Mod moderate TR ?AV veg vs degenerative changes    EKG:  The ekg ordered today demonstrates atrial fibrillation with a ventricular rate of 138  bpm.   Recent Labs: 03/30/2021: B Natriuretic Peptide 189.8 04/11/2021: ALT 9 04/20/2021: Hemoglobin 11.8; Platelets 253 04/21/2021: TSH 0.633 04/22/2021: BUN 21; Creatinine, Ser 1.21; Magnesium 2.1; Potassium 4.1; Sodium 135  Recent Lipid Panel    Component Value Date/Time   CHOL 132 04/21/2021 0143   CHOL 214 (H) 06/05/2020 0837   TRIG 263 (H) 04/21/2021 0143   HDL 33 (L) 04/21/2021 0143   HDL 40 06/05/2020 0837   CHOLHDL 4.0 04/21/2021 0143   VLDL 53 (H) 04/21/2021 0143   LDLCALC 46 04/21/2021 0143   LDLCALC 110 (H) 06/05/2020 0837   LDLDIRECT 137 (H) 03/01/2020 1012   LDLDIRECT 96.0 09/23/2019 0828    Physical Exam:    VS:  BP (!) 150/84   Pulse (!) 138   Ht '5\' 3"'  (1.6 m)   Wt 162 lb 9.6 oz (73.8 kg)   SpO2 98%   BMI 28.80 kg/m     Wt Readings from Last 3 Encounters:  05/22/21 162 lb 9.6 oz (73.8 kg)  05/16/21 159 lb 12.8 oz (72.5 kg)  05/14/21 161 lb (73 kg)     GEN:  Well nourished, well developed in no acute distress HEENT: Normal NECK: No JVD; No carotid bruits LYMPHATICS: No lymphadenopathy CARDIAC: Irregularly irregular, tachycardic, no murmurs, rubs, gallops RESPIRATORY:  Clear to auscultation without rales, wheezing or rhonchi   ABDOMEN: Soft, non-tender, non-distended MUSCULOSKELETAL:  No edema; No deformity  SKIN: Warm and dry NEUROLOGIC:  Alert and oriented x 3 PSYCHIATRIC:  Normal affect       ASSESSMENT:    1. Persistent atrial fibrillation ([place])   2. Chronic heart failure with preserved ejection fraction (HCC)   3. Pulmonary hypertension, unspecified ([place])   4. Obstructive sleep apnea (adult) (pediatric)   5. Essential hypertension   6. Aortic valve vegetation    PLAN:    In order of problems listed above:  #Longstanding persistent atrial fibrillation Minimally symptomatic but we should attempt better rate control to help preserve left ventricular function.  She is on diltiazem and metoprolol but still has heart rates greater than 130 bpm.  I would like to add digoxin today.  We will start 125 mcg by mouth once daily.  I will have her follow-up early next week for a dig level.  I will have her see one of our APP's in clinic in 2 to 3 weeks.  If her rates are still poorly controlled, recommend up titration of diltiazem.  I do not think cardioversion is indicated at this time because of the longstanding persistent nature of her A. fib.  #Possible aortic valve vegetation on limited echo Will reach out to her primary cardiologist to follow this up.  No infectious symptoms today.  I look back on several echoes and the aortic valve is appeared thickened on prior studies.  Recent blood cultures were negative.  #Hypertension Slightly above goal today Recommend she check her blood pressures 1-2 times per week.  Continue current regimen.    Medication Adjustments/Labs and Tests Ordered: Current medicines are reviewed at length with the patient today.  Concerns regarding medicines are outlined above.  Orders Placed This Encounter  Procedures   EKG 12-Lead   No orders of the defined types were placed in this encounter.    Signed, Hilton Cork. Quentin Ore, MD, [facility], [facility] 05/22/2021 2:39 PM     Electrophysiology [facility]

## 2021-05-22 NOTE — Patient Instructions (Addendum)
Medication Instructions:  Your physician has recommended you make the following change in your medication:    START taking digoxin 0.125 mg-  Take one tablet by mouth once a day  Lab Work:  You will come to the [employer] [employer] May 27, 2021 for a digoxin level:  If you have labs (blood work) drawn today and your tests are completely normal, you will receive your results only by: MyChart Message (if you have MyChart) OR A paper copy in the mail If you have any lab test that is abnormal or we need to change your treatment, we will call you to review the results.  Testing/Procedures: None ordered.  Follow-Up: At [facility], you and your health needs are our priority.  As part of our continuing mission to provide you with exceptional heart care, we have created designated Provider Care Teams.  These Care Teams include your primary Cardiologist (physician) and Advanced Practice Providers (APPs -  Physician Assistants and Nurse Practitioners) who all work together to provide you with the care you need, when you need it.  Your next appointment:   Your physician wants you to follow-up in: 2-3 weeks with either Francis Dowse, PA-C Casimiro Needle "[place]" [place], [place]  Digoxin Tablets What is this medication? DIGOXIN (di JOX in) treats heart failure. It may also be used to treat a type of arrhythmia known as AFib (atrial fibrillation). It works by helping your heart beat stronger, making it easier for your heart to pump blood to the rest of the body. It also slows down overactive electric signals in the heart, which stabilizes your heart rhythm. This medicine may be used for other purposes; ask your health care provider or pharmacist if you have questions. COMMON BRAND NAME(S): Digitek, Lanoxicaps, Lanoxin What should I tell my care team before I take this medication? They need to know if you have any of these conditions: Certain heart rhythm disorders Heart disease or recent heart  attack Kidney or liver disease An unusual or allergic reaction to digoxin, other medications, foods, dyes, or preservatives Pregnant or trying to get pregnant Breast-feeding How should I use this medication? Take this medication by mouth with a glass of water. Follow the directions on the prescription label. Take your doses at regular intervals. Do not take your medication more often than directed. Talk to your care team about the use of this medication in children. Special care may be needed. Overdosage: If you think you have taken too much of this medicine contact a poison control center or emergency room at once. NOTE: This medicine is only for you. Do not share this medicine with others. What if I miss a dose? If you miss a dose, take it as soon as you can. If it is almost time for your next dose, take only that dose. Do not take double or extra doses. What may interact with this medication? Activated charcoal Albuterol Alprazolam Antacids Antiviral medications for HIV or AIDS like ritonavir and saquinavir Calcium Certain antibiotics like azithromycin, clarithromycin, erythromycin, gentamicin, neomycin, trimethoprim, and tetracycline Certain medications for blood pressure, heart disease, irregular heart beat Certain medications for cancer Certain medications for cholesterol like atorvastatin, cholestyramine, and colestipol Certain medications for diabetes, like acarbose, exenatide, miglitol, and metformin Certain medications for fungal infections like ketoconazole and itraconazole Certain medications for stomach problems like omeprazole, esomeprazole, lansoprazole, rabeprazole, metoclopramide, and sucralfate Conivaptan Cyclosporine Diphenoxylate Epinephrine Kaolin; pectin Nefazodone NSAIDS, medications for pain and inflammation, like celecoxib, ibuprofen, or naproxen Penicillamine Phenytoin Propantheline Quinine  Phenytoin Rifampin Succinylcholine St. John's  Wort Sulfasalazine Teriparatide Thyroid hormones Tolvaptan This list may not describe all possible interactions. Give your health care provider a list of all the medicines, herbs, non-prescription drugs, or dietary supplements you use. Also tell them if you smoke, drink alcohol, or use illegal drugs. Some items may interact with your medicine. What should I watch for while using this medication? Visit your care team for regular checks on your progress. Do not stop taking this medication without the advice of your care team, even if you feel better. Do not change the brand you are taking, other brands may affect you differently. Check your heart rate and blood pressure regularly while you are taking this medication. Ask your care team what your heart rate and blood pressure should be, and when you should contact him or her. Your care team also may schedule regular blood tests and electrocardiograms to check your progress. Watch your diet. Less digoxin may be absorbed from the stomach if you have a diet high in bran fiber. Do not treat yourself for coughs, colds or allergies without asking your care team for advice. Some ingredients can increase possible side effects. What side effects may I notice from receiving this medication? Side effects that you should report to your care team as soon as possible: Allergic reactions--skin rash, itching, hives, swelling of the face, lips, tongue, or throat Digoxin toxicity--confusion, loss of appetite, nausea, vomiting, diarrhea, change in vision such as blurry or yellow vision, fatigue, fast or irregular heartbeat Slow heartbeat--dizziness, feeling faint or lightheaded, confusion, trouble breathing, unusual weakness or fatigue Side effects that usually do not require medical attention (report to your care team if they continue or are bothersome): Dizziness Stomach pain Unexpected breast tissue growth This list may not describe all possible side effects. Call  your doctor for medical advice about side effects. You may report side effects to FDA at 1-800-FDA-1088. Where should I keep my medication? Keep out of the reach of children. Store at room temperature between 15 and 30 degrees C (59 and 86 degrees F). Protect from light and moisture. Throw away any unused medication after the expiration date. NOTE: This sheet is a summary. It may not cover all possible information. If you have questions about this medicine, talk to your doctor, pharmacist, or health care provider.  2022 Elsevier/Gold Standard (2020-09-09 00:00:00)

## 2021-05-24 ENCOUNTER — Other Ambulatory Visit: Payer: Self-pay

## 2021-05-27 ENCOUNTER — Other Ambulatory Visit: Payer: Self-pay

## 2021-05-27 ENCOUNTER — Telehealth: Payer: Self-pay | Admitting: Internal Medicine

## 2021-05-27 ENCOUNTER — Other Ambulatory Visit: Payer: Self-pay | Admitting: *Deleted

## 2021-05-27 DIAGNOSIS — I4819 Other persistent atrial fibrillation: Secondary | ICD-10-CM

## 2021-05-27 DIAGNOSIS — I5032 Chronic diastolic (congestive) heart failure: Secondary | ICD-10-CM

## 2021-05-27 DIAGNOSIS — I272 Pulmonary hypertension, unspecified: Secondary | ICD-10-CM

## 2021-05-27 NOTE — Telephone Encounter (Signed)
-----   Message from Alver Sorrow, NP sent at 05/22/2021  4:06 PM EST ----- Thank you! ----- Message ----- From: Chrystie Nose, MD Sent: 05/22/2021   3:54 PM EST To: Lindell Spar, RN, Alver Sorrow, NP, #  Thanks .Marland Kitchen I see that echo finding. Luther Parody mentioned follow-up in her note, but not sure if I got the message or responded. Ms. Pilkenton is now 2+ months out of her bacteremia. I think we should probably get a limited TTE as an outpatient, hopefully the windows are better and we can exclude a vegetation rather than putting her through TEE.  I'll ask my nurse to order it.  Thanks Sheria Lang and [place]!  -[place]   ----- Message ----- From: Lanier Prude, MD Sent: 05/22/2021   2:40 PM EST To: Chrystie Nose, MD, Alver Sorrow, NP  [place] and [place], [place] Ms Pujol today for AF w/ RVR. Adding digoxin. We will check a level next week. I saw her recent TTE was read as concerning for AV vegetation by [facility]. No infectious symptoms today but wanted to make sure this was on y'all's radar for follow up. Had recent blood cultures which were negative. Thanks! Sheria Lang

## 2021-05-27 NOTE — Telephone Encounter (Signed)
Left message for patient to call back  [phone] [facility])   Limited echo ordered

## 2021-05-28 ENCOUNTER — Ambulatory Visit: Payer: Medicaid Other | Attending: Internal Medicine | Admitting: Pharmacist

## 2021-05-28 ENCOUNTER — Other Ambulatory Visit: Payer: Self-pay

## 2021-05-28 DIAGNOSIS — E1142 Type 2 diabetes mellitus with diabetic polyneuropathy: Secondary | ICD-10-CM

## 2021-05-28 LAB — DIGOXIN LEVEL: Digoxin, Serum: <0.4 ng/mL — ABNORMAL LOW (ref 0.5–0.9)

## 2021-05-28 MED ORDER — BASAGLAR KWIKPEN 100 UNIT/ML ~~LOC~~ SOPN
34.0000 [IU] | PEN_INJECTOR | Freq: Every day | SUBCUTANEOUS | 6 refills | Status: DC
  Filled 2021-05-28: qty 30, 88d supply, fill #0

## 2021-05-28 NOTE — Progress Notes (Signed)
° °  S:    PCP: Dr. Laural Benes  No chief complaint on file.  Patient arrives in good spirits.  Presents for diabetes management at the request of Dr. Laural Benes. Patient was referred on 05/07/2021.  Since that visit, pt was seen by Dr. Lonzo Cloud. Jardiance was added at that visit.   Family/Social History:  - FHx: diabetes (sister); heart attack (mom, dad) - Tobacco: never smoker - Alcohol: denies using alcohol  Insurance coverage/medication affordability:  - DNBI  Patient reports adherence with medications.  Current diabetes medications include:  - Humalog 12 units with each meal + correction scale (BG - 130/30) - Jardiance 10 mg daily  - Lantus 30 units daily - Liraglutide 1.2 mg daily  Patient denies hypoglycemic events.   Patient reported dietary habits:  - Eats 3 meals/day - Breakfast: cereal or eggs - Lunch: tuna fish or soup - Dinner:steak and potatoes - Drinks: Gatorade - Denies eating sweets.  Patient-reported exercise habits: nothing outside of work   Patient reports nocturia.  Patient denies neuropathy. Patient denies visual changes. Patient reports self foot exams.   O:   No meter with her today. Tells me that she will see the occasional 300-400 but for the most part, she feels her home CBGs are improving since Jardiance was added to her regimen.  Home fasting CBG: reports 190s. This morning, it was 197.  2 hour post-prandial/random CBG: none given  Lab Results  Component Value Date   HGBA1C 10.1 (A) 05/07/2021   There were no vitals filed for this visit.  Lipid Panel     Component Value Date/Time   CHOL 132 04/21/2021 0143   CHOL 214 (H) 06/05/2020 0837   TRIG 263 (H) 04/21/2021 0143   HDL 33 (L) 04/21/2021 0143   HDL 40 06/05/2020 0837   CHOLHDL 4.0 04/21/2021 0143   VLDL 53 (H) 04/21/2021 0143   LDLCALC 46 04/21/2021 0143   LDLCALC 110 (H) 06/05/2020 0837   LDLDIRECT 137 (H) 03/01/2020 1012   LDLDIRECT 96.0 09/23/2019 0828   Clinical ASCVD:  YES - CAD The 10-year ASCVD risk score (Arnett DK, et al., 2019) is: 16.9%   Values used to calculate the score:     Age: 65 years     Sex: Female     Is Non-Hispanic African American: No     Diabetic: Yes     Tobacco smoker: No     Systolic Blood Pressure: 150 mmHg     Is BP treated: Yes     HDL Cholesterol: 33 mg/dL     Total Cholesterol: 132 mg/dL   A/P: Diabetes longstanding currently uncontrolled. Patient is able to verbalize appropriate hypoglycemia management plan. Patient is adherent with medication.  -Increase Basaglar to 34 units daily.  -Continue other medications at current doses.  -Extensively discussed pathophysiology of DM, recommended lifestyle interventions, dietary effects on glycemic control -Counseled on s/sx of and management of hypoglycemia -Next A1C anticipated 07/2020.   Written patient instructions provided.  Total time in face to face counseling 30 minutes.   Follow up w/ PCP 06/18/2020.     Butch Penny, PharmD, P[place]y [place], CPP Clinical Pharmacist [facility] [phone]

## 2021-05-29 ENCOUNTER — Other Ambulatory Visit: Payer: Self-pay

## 2021-05-30 NOTE — Telephone Encounter (Signed)
Limited echo scheduled 06/19/21

## 2021-06-04 ENCOUNTER — Telehealth: Payer: Self-pay | Admitting: Student

## 2021-06-04 ENCOUNTER — Emergency Department (HOSPITAL_COMMUNITY)
Admission: EM | Admit: 2021-06-04 | Discharge: 2021-06-04 | Disposition: A | Discharge status: Home / Self Care | Payer: Medicaid Other | Attending: Emergency Medicine | Admitting: Emergency Medicine

## 2021-06-04 ENCOUNTER — Other Ambulatory Visit: Payer: Self-pay

## 2021-06-04 DIAGNOSIS — E1142 Type 2 diabetes mellitus with diabetic polyneuropathy: Secondary | ICD-10-CM | POA: Insufficient documentation

## 2021-06-04 DIAGNOSIS — Z7984 Long term (current) use of oral hypoglycemic drugs: Secondary | ICD-10-CM | POA: Insufficient documentation

## 2021-06-04 DIAGNOSIS — E162 Hypoglycemia, unspecified: Secondary | ICD-10-CM

## 2021-06-04 DIAGNOSIS — E11649 Type 2 diabetes mellitus with hypoglycemia without coma: Secondary | ICD-10-CM | POA: Insufficient documentation

## 2021-06-04 DIAGNOSIS — Z7901 Long term (current) use of anticoagulants: Secondary | ICD-10-CM | POA: Insufficient documentation

## 2021-06-04 DIAGNOSIS — Z79899 Other long term (current) drug therapy: Secondary | ICD-10-CM | POA: Insufficient documentation

## 2021-06-04 DIAGNOSIS — I5032 Chronic diastolic (congestive) heart failure: Secondary | ICD-10-CM | POA: Insufficient documentation

## 2021-06-04 DIAGNOSIS — I110 Hypertensive heart disease with heart failure: Secondary | ICD-10-CM | POA: Insufficient documentation

## 2021-06-04 DIAGNOSIS — I4891 Unspecified atrial fibrillation: Secondary | ICD-10-CM | POA: Insufficient documentation

## 2021-06-04 DIAGNOSIS — Z794 Long term (current) use of insulin: Secondary | ICD-10-CM | POA: Insufficient documentation

## 2021-06-04 LAB — CBC WITH DIFFERENTIAL/PLATELET
Abs Immature Granulocytes: 0.05 10*3/uL (ref 0.00–0.07)
Basophils Absolute: 0 10*3/uL (ref 0.0–0.1)
Basophils Relative: 0 %
Eosinophils Absolute: 0 10*3/uL (ref 0.0–0.5)
Eosinophils Relative: 0 %
HCT: 44.5 % (ref 36.0–46.0)
Hemoglobin: 14.4 g/dL (ref 12.0–15.0)
Immature Granulocytes: 0 %
Lymphocytes Relative: 9 %
Lymphs Abs: 1.1 10*3/uL (ref 0.7–4.0)
MCH: 29.4 pg (ref 26.0–34.0)
MCHC: 32.4 g/dL (ref 30.0–36.0)
MCV: 90.8 fL (ref 80.0–100.0)
Monocytes Absolute: 0.3 10*3/uL (ref 0.1–1.0)
Monocytes Relative: 3 %
Neutro Abs: 11.4 10*3/uL — ABNORMAL HIGH (ref 1.7–7.7)
Neutrophils Relative %: 88 %
Platelets: 255 10*3/uL (ref 150–400)
RBC: 4.9 MIL/uL (ref 3.87–5.11)
RDW: 14.1 % (ref 11.5–15.5)
WBC: 12.9 10*3/uL — ABNORMAL HIGH (ref 4.0–10.5)
nRBC: 0 % (ref 0.0–0.2)

## 2021-06-04 LAB — COMPREHENSIVE METABOLIC PANEL
ALT: 25 U/L (ref 0–44)
AST: 32 U/L (ref 15–41)
Albumin: 3.9 g/dL (ref 3.5–5.0)
Alkaline Phosphatase: 97 U/L (ref 38–126)
Anion gap: 11 (ref 5–15)
BUN: 39 mg/dL — ABNORMAL HIGH (ref 8–23)
CO2: 23 mmol/L (ref 22–32)
Calcium: 9.7 mg/dL (ref 8.9–10.3)
Chloride: 102 mmol/L (ref 98–111)
Creatinine, Ser: 1.11 mg/dL — ABNORMAL HIGH (ref 0.44–1.00)
GFR, Estimated: 56 mL/min — ABNORMAL LOW (ref >60)
Glucose, Bld: 222 mg/dL — ABNORMAL HIGH (ref 70–99)
Potassium: 4 mmol/L (ref 3.5–5.1)
Sodium: 136 mmol/L (ref 135–145)
Total Bilirubin: 0.9 mg/dL (ref 0.3–1.2)
Total Protein: 7.6 g/dL (ref 6.5–8.1)

## 2021-06-04 LAB — CBG MONITORING, ED
Glucose-Capillary: 162 mg/dL — ABNORMAL HIGH (ref 70–99)
Glucose-Capillary: 189 mg/dL — ABNORMAL HIGH (ref 70–99)

## 2021-06-04 LAB — URINALYSIS, ROUTINE W REFLEX MICROSCOPIC
Bilirubin Urine: NEGATIVE
Glucose, UA: >=500 mg/dL — AB
Ketones, ur: NEGATIVE mg/dL
Leukocytes,Ua: NEGATIVE
Nitrite: NEGATIVE
Protein, ur: 100 mg/dL — AB
Specific Gravity, Urine: >1.03 — ABNORMAL HIGH (ref 1.005–1.030)
pH: 5 (ref 5.0–8.0)

## 2021-06-04 LAB — URINALYSIS, MICROSCOPIC (REFLEX)

## 2021-06-04 LAB — DIGOXIN LEVEL: Digoxin Level: 0.8 ng/mL (ref 0.8–2.0)

## 2021-06-04 MED ORDER — METOPROLOL SUCCINATE ER 25 MG PO TB24: 100.0000 mg | ORAL_TABLET | Freq: Two times a day (BID) | ORAL | Status: DC

## 2021-06-04 MED ORDER — APIXABAN 5 MG PO TABS
5.0000 mg | ORAL_TABLET | Freq: Two times a day (BID) | ORAL | Status: DC
  Administered 2021-06-04: 12:00:00 5 mg via ORAL
  Filled 2021-06-04: qty 1

## 2021-06-04 MED ORDER — DIGOXIN 125 MCG PO TABS
0.1250 mg | ORAL_TABLET | Freq: Every day | ORAL | Status: DC
  Administered 2021-06-04: 12:00:00 0.125 mg via ORAL
  Filled 2021-06-04: qty 1

## 2021-06-04 MED ORDER — DILTIAZEM HCL ER COATED BEADS 120 MG PO CP24
120.0000 mg | ORAL_CAPSULE | Freq: Every day | ORAL | Status: DC
  Administered 2021-06-04: 12:00:00 120 mg via ORAL
  Filled 2021-06-04 (×2): qty 1

## 2021-06-04 NOTE — ED Notes (Signed)
ED Snack bag given to Pt

## 2021-06-04 NOTE — Care Management (Signed)
Transportation  called  to pick patient up from ED waiting room. Patient texted. As well

## 2021-06-04 NOTE — Telephone Encounter (Signed)
[facility] is calling stating she is currently being released from the hospital and wants to know if Casimiro Needle wants her to keep her appointment for tomorrow or schedule for a later date.

## 2021-06-04 NOTE — ED Notes (Signed)
Did ekg shown to er provider

## 2021-06-04 NOTE — ED Provider Notes (Signed)
[place] EMERGENCY DEPARTMENT Provider Note   CSN: [id number] Arrival date & time: 06/04/21  0000     History Chief Complaint  Patient presents with   Hypoglycemia    Emily Farmer is a 65 y.o. female.  HPI Patient is a 65 year old female with a past medical history significant for A. fib on Eliquis and metoprolol, DM2 with insulin therapy  Patient is presented to the emergency room today after she was brought in by EMS due to hypoglycemia after taking her prescribed insulin and then forgetting to eat a meal.  EMS provided patient with 12.5 g of D10 blood sugar improved.  She was given food which she tolerated p.o.  She states that this time she has no symptoms at all.  She states that before EMS arrived she had had some slurred speech and felt shaky cold and somewhat sweaty.  She was worried that she was having a stroke.  She denies any numbness or weakness.  She states that all of her symptoms are now resolved and she feels well.  Denies any chest pain or difficulty breathing.  She is aware that she is in atrial fibrillation she has a history of this and is being managed by a cardiologist recently started on digoxin.  She has not taken any of her medications today since she has been in the emergency room waiting room for 11 hours.     Past Medical History:  Diagnosis Date   Arthritis    Atrial fibrillation ([place])    a. 09/2016 in setting of pancreatitis;  b. 09/2016 Echo: EF 65-60%, no rwma, Gr2 DD, mild MR, triv TR, PASP 29mHg;  CHA2DS2VASc = 4-->Eliquis 540mBID. c. Recurred 06/2018 in setting of GIB.   Chronic diastolic CHF (congestive heart failure) (HCErda8/15/2019   Diabetes mellitus    GI bleeding 06/2018   Gout    Hyperlipidemia    Hypertension    Hypertriglyceridemia    Pancreatitis    a. 09/2016 - Triglycerides 1,392 on admission.    Patient Active Problem List   Diagnosis Date Noted   Bacteremia 04/01/2021   Atrial fibrillation with RVR (HCDay[place] 03/31/2021   Pulmonary hypertension (HCBristow10/09/2020   Hypomagnesemia    Sepsis (HCAshmore09/14/2022   Atrial fibrillation with rapid ventricular response (HCMcKinley09/14/2022   Hypotension 02/27/2021   Secondary hypercoagulable state (HCEvaro11/23/2021   Type 2 diabetes mellitus with diabetic polyneuropathy, with long-term current use of insulin (HCYonah04/02/2020   Uncontrolled type 2 diabetes mellitus with hyperglycemia, with long-term current use of insulin (HCNew Roads04/02/2020   Dyslipidemia 09/23/2019   Toenail fungus 07/19/2018   Depression 07/19/2018   Iron deficiency anemia due to chronic blood loss 07/19/2018   Gastritis and duodenitis 07/19/2018   Chronic a-fib (HCGlenm[facility]08/15/2019   Hyponatremia 01/28/2018   Chronic diastolic CHF (congestive heart failure) (HCVilla Grove08/15/2019   Mixed hyperlipidemia 06/01/2017   Paroxysmal A-fib (HCCulbertson04/21/2018   Hypokalemia 01/04/2014   Acute pyelonephritis 02/15/2013   Essential hypertension 02/15/2013   Hypertriglyceridemia 02/15/2013   Diabetes mellitus type 2 in obese (HCPreston09/07/2012   Coronary atherosclerosis seen on CT 02/15/2013    Past Surgical History:  Procedure Laterality Date   BIOPSY  06/20/2018   Procedure: BIOPSY;  Surgeon: MaClarene EssexMD;  [facility] Service: Endoscopy;;   CHOLECYSTECTOMY N/A 01/04/2014   Procedure: LAPAROSCOPIC CHOLECYSTECTOMY WITH INTRAOPERATIVE CHOLANGIOGRAM;  Surgeon: ArRalene OkMD;  [facility] Service: General;  Laterality: N/A;  COLONOSCOPY WITH PROPOFOL N/A 06/21/2018   Procedure: COLONOSCOPY WITH PROPOFOL;  Surgeon: Ronald Lobo, MD;  Location: [facility];  Service: Endoscopy;  Laterality: N/A;   ESOPHAGOGASTRODUODENOSCOPY (EGD) WITH PROPOFOL N/A 06/20/2018   Procedure: ESOPHAGOGASTRODUODENOSCOPY (EGD) WITH PROPOFOL;  Surgeon: Clarene Essex, MD;  Location: [facility];  Service: Endoscopy;  Laterality: N/A;   LUNG SURGERY       OB History   No obstetric history on file.     Family  History  Problem Relation Age of Onset   Heart attack Mother    Heart attack Father    Diabetes Sister    High blood pressure Neg Hx    High Cholesterol Neg Hx     Social History   Tobacco Use   Smoking status: Never   Smokeless tobacco: Never  Vaping Use   Vaping Use: Never used  Substance Use Topics   Alcohol use: No   Drug use: No    Home Medications Prior to Admission medications   Medication Sig Start Date End Date Taking? Authorizing Provider  acetaminophen (TYLENOL) 325 MG tablet Take 2 tablets (650 mg total) by mouth every 6 (six) hours as needed for mild pain (or Fever >/= 101). 02/01/18  Yes Elgergawy, Silver Huguenin, MD  apixaban (ELIQUIS) 5 MG TABS tablet Take 1 tablet (5 mg total) by mouth 2 (two) times daily. 04/04/21 07/03/21 Yes Ladell Pier, MD  atorvastatin (LIPITOR) 40 MG tablet Take 1 tablet (40 mg total) by mouth daily. 03/21/21  Yes Loel Dubonnet, NP  digoxin (LANOXIN) 0.125 MG tablet Take 1 tablet (0.125 mg total) by mouth daily. 05/22/21  Yes Vickie Epley, MD  diltiazem (CARDIZEM CD) 120 MG 24 hr capsule Take 1 capsule (120 mg total) by mouth daily. 05/14/21  Yes Loel Dubonnet, NP  empagliflozin (JARDIANCE) 10 MG TABS tablet Take 1 tablet (10 mg total) by mouth daily before breakfast. 05/16/21  Yes Shamleffer, Melanie Crazier, MD  ezetimibe (ZETIA) 10 MG tablet Take 1 tablet (10 mg total) by mouth daily. 04/23/21  Yes Gerrit Heck, MD  ferrous sulfate 325 (65 FE) MG tablet Take 1 tablet (325 mg total) by mouth daily with breakfast. 04/11/21 04/11/22 Yes McClung, Dionne Bucy, PA-C  Insulin Glargine (BASAGLAR KWIKPEN) 100 UNIT/ML Inject 34 Units into the skin at bedtime. 05/28/21  Yes Ladell Pier, MD  insulin lispro (HUMALOG KWIKPEN) 100 UNIT/ML KwikPen Max daily 60 units Patient taking differently: Inject 20 Units into the skin 3 (three) times daily. 05/16/21  Yes Shamleffer, Melanie Crazier, MD  liraglutide (VICTOZA) 18 MG/3ML SOPN Inject 1.2  mg into the skin daily. 05/16/21  Yes Shamleffer, Melanie Crazier, MD  metoprolol succinate (TOPROL-XL) 100 MG 24 hr tablet Take 1 tablet (100 mg total) by mouth 2 (two) times daily. Take with or immediately following a meal. 05/14/21  Yes Loel Dubonnet, NP  Multiple Vitamins-Minerals (CENTRUM SILVER PO) Take 1 tablet by mouth daily.   Yes [provider]  Blood Glucose Monitoring Suppl (TRUE METRIX METER) w/Device KIT Use to monitor blood sugar as directed Patient taking differently: 1 each by Other route See admin instructions. Use to monitor blood sugar as directed 02/22/18   Fulp, Cammie, MD  Continuous Blood Gluc Sensor (DEXCOM G6 SENSOR) MISC 1 Device by Does not apply route as directed. 05/16/21   Shamleffer, Melanie Crazier, MD  Continuous Blood Gluc Transmit (DEXCOM G6 TRANSMITTER) MISC 1 Device by Does not apply route as directed. 05/16/21   Shamleffer, Mammie Lorenzo  Amedeo Kinsman, MD  glucose blood test strip USE AS INSTRUCTED TO CHECK BLOOD SUGARS 3 TIMES DAILY Patient taking differently: 1 each by Other route See admin instructions. Check blood sugar 4 times daily 03/27/20   Fulp, Cammie, MD  Insulin Pen Needle 32G X 4 MM MISC use as directed in the morning, at noon, in the evening, and at bedtime. 05/16/21   Shamleffer, Melanie Crazier, MD  Insulin Syringe-Needle U-100 (TRUEPLUS INSULIN SYRINGE) 31G X 5/16" 0.3 ML MISC Use to inject insulin daily. Patient taking differently: 1 each by Other route See admin instructions. Use to inject insulin daily. 05/06/18   Fulp, Cammie, MD  metFORMIN (GLUCOPHAGE) 500 MG tablet Take 0.5 tablets (250 mg total) by mouth 4 (four) times daily. Patient not taking: Reported on 06/04/2021 09/27/19   Ladell Pier, MD    Allergies    Patient has no known allergies.  Review of Systems   Review of Systems  Constitutional:  Negative for chills and fever.       Diaphoresis  HENT:  Negative for congestion.   Eyes:  Negative for pain.  Respiratory:   Negative for cough and shortness of breath.   Cardiovascular:  Negative for chest pain and leg swelling.  Gastrointestinal:  Negative for abdominal pain and vomiting.  Genitourinary:  Negative for dysuria.  Musculoskeletal:  Negative for myalgias.  Skin:  Negative for rash.  Neurological:  Positive for tremors and weakness. Negative for dizziness and headaches.       Slurred speech, tremors   Physical Exam Updated Vital Signs BP 120/67    Pulse (!) 108    Temp 97.9 F (36.6 C) (Oral)    Resp 16    Ht 5' 3" (1.6 m)    Wt 72.6 kg    SpO2 99%    BMI 28.34 kg/m   Physical Exam Vitals and nursing note reviewed.  Constitutional:      General: She is not in acute distress. HENT:     Head: Normocephalic and atraumatic.     Nose: Nose normal.     Mouth/Throat:     Mouth: Mucous membranes are moist.  Eyes:     General: No scleral icterus. Cardiovascular:     Rate and Rhythm: Normal rate and regular rhythm.     Pulses: Normal pulses.     Heart sounds: Normal heart sounds.  Pulmonary:     Effort: Pulmonary effort is normal. No respiratory distress.     Breath sounds: No wheezing.  Abdominal:     Palpations: Abdomen is soft.     Tenderness: There is no abdominal tenderness. There is no guarding or rebound.  Musculoskeletal:     Cervical back: Normal range of motion.     Right lower leg: No edema.     Left lower leg: No edema.  Skin:    General: Skin is warm and dry.     Capillary Refill: Capillary refill takes less than 2 seconds.  Neurological:     Mental Status: She is alert. Mental status is at baseline.     Comments: Alert and oriented to self, place, time and event.   Speech is fluent, clear without dysarthria or dysphasia.   Strength 5/5 in upper/lower extremities   Sensation intact in upper/lower extremities   CN I not tested  CN II grossly intact visual fields bilaterally. Did not visualize posterior eye.  CN III, IV, VI PERRLA and EOMs intact bilaterally  CN V  Intact sensation to sharp  and light touch to the face  CN VII facial movements symmetric  CN VIII not tested  CN IX, X no uvula deviation, symmetric rise of soft palate  CN XI 5/5 SCM and trapezius strength bilaterally  CN XII Midline tongue protrusion, symmetric L/R movements    Psychiatric:        Mood and Affect: Mood normal.        Behavior: Behavior normal.    ED Results / Procedures / Treatments   Labs (all labs ordered are listed, but only abnormal results are displayed) Labs Reviewed  CBC WITH DIFFERENTIAL/PLATELET - Abnormal; Notable for the following components:      Result Value   WBC 12.9 (*)    Neutro Abs 11.4 (*)    All other components within normal limits  COMPREHENSIVE METABOLIC PANEL - Abnormal; Notable for the following components:   Glucose, Bld 222 (*)    BUN 39 (*)    Creatinine, Ser 1.11 (*)    GFR, Estimated 56 (*)    All other components within normal limits  URINALYSIS, ROUTINE W REFLEX MICROSCOPIC - Abnormal; Notable for the following components:   Specific Gravity, Urine >1.030 (*)    Glucose, UA >=500 (*)    Hgb urine dipstick SMALL (*)    Protein, ur 100 (*)    All other components within normal limits  URINALYSIS, MICROSCOPIC (REFLEX) - Abnormal; Notable for the following components:   Bacteria, UA RARE (*)    All other components within normal limits  CBG MONITORING, ED - Abnormal; Notable for the following components:   Glucose-Capillary 162 (*)    All other components within normal limits  CBG MONITORING, ED - Abnormal; Notable for the following components:   Glucose-Capillary 189 (*)    All other components within normal limits  DIGOXIN LEVEL    EKG EKG Interpretation  Date/Time:  Tuesday June 04 2021 11:05:31 EST Ventricular Rate:  120 PR Interval:    QRS Duration: 90 QT Interval:  279 QTC Calculation: 395 R Axis:   40 Text Interpretation: Atrial fibrillation Ventricular premature complex Low voltage, precordial leads  Anteroseptal infarct, old Nonspecific repol abnormality, diffuse leads Confirmed by Octaviano Glow [phone]) on 06/04/2021 11:13:19 AM  Radiology No results found.  Procedures Procedures   Medications Ordered in ED Medications  apixaban (ELIQUIS) tablet 5 mg (5 mg Oral Given 06/04/21 1137)  digoxin (LANOXIN) tablet 0.125 mg (0.125 mg Oral Given 06/04/21 1136)  diltiazem (CARDIZEM CD) 24 hr capsule 120 mg (120 mg Oral Given 06/04/21 1137)  metoprolol succinate (TOPROL-XL) 24 hr tablet 100 mg (has no administration in time range)    ED Course  I have reviewed the triage vital signs and the nursing notes.  Pertinent labs & imaging results that were available during my care of the patient were reviewed by me and considered in my medical decision making (see chart for details).  Clinical Course as of 06/04/21 1253  Tue Jun 04, 2021  1252 CMP notable for hyperglycemia of 222.  Creatinine at baseline and BUN not significantly elevated.  Will recommend hydration at home.  CBC with mild leukocytosis perhaps due to physiologic stress of hyperglycemia.  Urinalysis without evidence of infection. [WF]    Clinical Course User Index [WF] Tedd Sias, [place]   MDM Rules/Calculators/A&P                          Patient found to be hypoglycemic after using insulin  without p.o. food  Patient initially had slurred speech however this improved with correcting her blood sugar.  She is now symptom-free.  On my neurologic examination patient has no extremity weakness slurred speech or facial droop.  She is follow my commands well alert and oriented x3.  She is in atrial fibrillation currently this is a chronic issue.  She has been started on multiple medications for this specifically diltiazem, metoprolol and digoxin most recently 13 days ago.  She is been taking all of her medications as prescribed however is not taking her medications for the past 11 hours while she was in the emergency room.  She is  given her home medications here.  She denies any lightheadedness shortness of breath or chest pain. She was quite tachycardic at her cardiology office visit 13 days ago.  Heart rate was in 130s at that time.  Similar today.  Repeat CBG 189.  Patient has tolerated additional food here.  Will discharge home at this time.  Final Clinical Impression(s) / ED Diagnoses Final diagnoses:  Hypoglycemia    Rx / DC Orders ED Discharge Orders     None        Tedd Sias, [place] 06/04/21 1331    Wyvonnia Dusky, MD 06/05/21 [id number]

## 2021-06-04 NOTE — Discharge Instructions (Signed)
Your blood sugar has improved.  Please continue to eat and take your insulin as prescribed.  And atrial fibrillation.  Please continue to follow-up with your cardiology team to manage this.  May always return to the emergency room for any new or concerning symptoms.

## 2021-06-04 NOTE — Telephone Encounter (Signed)
Called pt back, she is currently in the ED for hypoglycemia.  Pt would like to know if she can reschedule 06/05/21 OV.  OV with [place], PA changed to 06/19/21 at 11:40 am.  Will send pt an appointment reminder in the mail.

## 2021-06-04 NOTE — ED Notes (Signed)
Placed patient on the monitor patient is resting with call bell in reach 

## 2021-06-04 NOTE — ED Provider Notes (Signed)
Emergency Medicine Provider Triage Evaluation Note  Emily Farmer , a 65 y.o. female  was evaluated in triage.  Pt complains of hypoglycemia.  Was paying some of her sisters bills today as she is in the hospital, took her short acting insulin (admits to taking some extra since she missed lunch) and started to get sweaty/lightheaded.  CBG on EMS arrival 90 but dropped to 70's quickly.  Sugar is usually in the 200 range so this is low for her.    Review of Systems  Positive: hypoglycemic Negative: Fever, chills  Physical Exam  BP 120/90 (BP Location: Left Arm)    Pulse 68    Resp 18    Ht 5\' 3"  (1.6 m)    Wt 72.6 kg    SpO2 99%    BMI 28.34 kg/m   Gen:   Awake, no distress, eating sandwich Resp:  Normal effort  MSK:   Moves extremities without difficulty  Other:  AAOx3, answering questions and following commands appropriately, speech clear and goal oriented  Medical Decision Making  Medically screening exam initiated at 12:02 AM.  Appropriate orders placed.  Emily Farmer was informed that the remainder of the evaluation will be completed by another provider, this initial triage assessment does not replace that evaluation, and the importance of remaining in the ED until their evaluation is complete.  Hypoglycemia, CBG 162 in triage now.  VSS.  Feeling better after D10 and eating sandwich in triage.  She is awake, alert, oriented.  Will check basic labs, UA.   [name], PA-C 06/04/21 06/06/21    [id number], MD 06/04/21 0530

## 2021-06-04 NOTE — ED Triage Notes (Signed)
Pt brought to ED triage by [facility] via wheelchair with c/o of hypoglycemia after taking insulin without eating meal. EMS administered 12.5g D10. Pt also given sandwich and juice, appears stable at time of triage.

## 2021-06-04 NOTE — ED Notes (Signed)
Unable to obtain oral temperature at this time 

## 2021-06-04 NOTE — Progress Notes (Deleted)
PCP:  Ladell Pier, MD Primary Cardiologist: Pixie Casino, MD Electrophysiologist: Vickie Epley, MD   Emily Farmer is a 65 y.o. female seen today for Vickie Epley, MD for routine electrophysiology followup.  Since last being seen in our clinic the patient reports doing ***.  she denies chest pain, palpitations, dyspnea, PND, orthopnea, nausea, vomiting, dizziness, syncope, edema, weight gain, or early satiety.  Past Medical History:  Diagnosis Date   Arthritis    Atrial fibrillation ([place])    a. 09/2016 in setting of pancreatitis;  b. 09/2016 Echo: EF 65-60%, no rwma, Gr2 DD, mild MR, triv TR, PASP 42mHg;  CHA2DS2VASc = 4-->Eliquis 549mBID. c. Recurred 06/2018 in setting of GIB.   Chronic diastolic CHF (congestive heart failure) (HCLacassine8/15/2019   Diabetes mellitus    GI bleeding 06/2018   Gout    Hyperlipidemia    Hypertension    Hypertriglyceridemia    Pancreatitis    a. 09/2016 - Triglycerides 1,392 on admission.   Past Surgical History:  Procedure Laterality Date   BIOPSY  06/20/2018   Procedure: BIOPSY;  Surgeon: MaClarene EssexMD;  [place] [facility]: Endoscopy;;   CHOLECYSTECTOMY N/A 01/04/2014   Procedure: LAPAROSCOPIC CHOLECYSTECTOMY WITH INTRAOPERATIVE CHOLANGIOGRAM;  Surgeon: ArRalene OkMD;  [place]: General;  Laterality: N/A;   COLONOSCOPY WITH PROPOFOL N/A 06/21/2018   Procedure: COLONOSCOPY WITH PROPOFOL;  Surgeon: BuRonald LoboMD;  [facility]: Endoscopy;  Laterality: N/A;   ESOPHAGOGASTRODUODENOSCOPY (EGD) WITH PROPOFOL N/A 06/20/2018   Procedure: ESOPHAGOGASTRODUODENOSCOPY (EGD) WITH PROPOFOL;  Surgeon: MaClarene EssexMD;  [place] [facility]: Endoscopy;  Laterality: N/A;   LUNG SURGERY      Current Outpatient Medications  Medication Sig Dispense Refill   acetaminophen (TYLENOL) 325 MG tablet Take 2 tablets (650 mg total) by mouth every 6 (six) hours as needed for mild pain (or  Fever >/= 101).     apixaban (ELIQUIS) 5 MG TABS tablet Take 1 tablet (5 mg total) by mouth 2 (two) times daily. 60 tablet 4   atorvastatin (LIPITOR) 40 MG tablet Take 1 tablet (40 mg total) by mouth daily. 90 tablet 3   Blood Glucose Monitoring Suppl (TRUE METRIX METER) w/Device KIT Use to monitor blood sugar as directed (Patient taking differently: 1 each by Other route See admin instructions. Use to monitor blood sugar as directed) 1 kit 0   Continuous Blood Gluc Sensor (DEXCOM G6 SENSOR) MISC 1 Device by Does not apply route as directed. 3 each 11   Continuous Blood Gluc Transmit (DEXCOM G6 TRANSMITTER) MISC 1 Device by Does not apply route as directed. 1 each 3   digoxin (LANOXIN) 0.125 MG tablet Take 1 tablet (0.125 mg total) by mouth daily. 90 tablet 3   diltiazem (CARDIZEM CD) 120 MG 24 hr capsule Take 1 capsule (120 mg total) by mouth daily. 90 capsule 3   empagliflozin (JARDIANCE) 10 MG TABS tablet Take 1 tablet (10 mg total) by mouth daily before breakfast. 90 tablet 1   ezetimibe (ZETIA) 10 MG tablet Take 1 tablet (10 mg total) by mouth daily. 30 tablet 0   ferrous sulfate 325 (65 FE) MG tablet Take 1 tablet (325 mg total) by mouth daily with breakfast. 30 tablet 4   glucose blood test strip USE AS INSTRUCTED TO CHECK BLOOD SUGARS 3 TIMES DAILY (Patient taking differently: 1 each by Other route See admin instructions. Check blood sugar 4 times daily)  100 each 11   Insulin Glargine (BASAGLAR KWIKPEN) 100 UNIT/ML Inject 34 Units into the skin at bedtime. 30 mL 6   insulin lispro (HUMALOG KWIKPEN) 100 UNIT/ML KwikPen Max daily 60 units 30 mL 6   Insulin Pen Needle 32G X 4 MM MISC use as directed in the morning, at noon, in the evening, and at bedtime. 400 each 3   Insulin Syringe-Needle U-100 (TRUEPLUS INSULIN SYRINGE) 31G X 5/16" 0.3 ML MISC Use to inject insulin daily. (Patient taking differently: 1 each by Other route See admin instructions. Use to inject insulin daily.) 100 each 11    liraglutide (VICTOZA) 18 MG/3ML SOPN Inject 1.2 mg into the skin daily. 18 mL 3   metFORMIN (GLUCOPHAGE) 500 MG tablet Take 0.5 tablets (250 mg total) by mouth 4 (four) times daily. 60 tablet 2   metoprolol succinate (TOPROL-XL) 100 MG 24 hr tablet Take 1 tablet (100 mg total) by mouth 2 (two) times daily. Take with or immediately following a meal. 180 tablet 3   Multiple Vitamins-Minerals (CENTRUM SILVER PO) Take 1 tablet by mouth daily.     No current facility-administered medications for this visit.    No Known Allergies  Social History   Socioeconomic History   Marital status: Single    Spouse name: Not on file   Number of children: Not on file   Years of education: Not on file   Highest education level: Not on file  Occupational History   Not on file  Tobacco Use   Smoking status: Never   Smokeless tobacco: Never  Vaping Use   Vaping Use: Never used  Substance and Sexual Activity   Alcohol use: No   Drug use: No   Sexual activity: Not Currently  Other Topics Concern   Not on file  Social History Narrative   Lives with her sister and does not need any assistance with ADLs.     Social Determinants of Health   Financial Resource Strain: Not on file  Food Insecurity: Not on file  Transportation Needs: Not on file  Physical Activity: Not on file  Stress: Not on file  Social Connections: Not on file  Intimate Partner Violence: Not on file     Review of Systems: All other systems reviewed and are otherwise negative except as noted above.  Physical Exam: There were no vitals filed for this visit.  GEN- The patient is well appearing, alert and oriented x 3 today.   HEENT: normocephalic, atraumatic; sclera clear, conjunctiva pink; hearing intact; oropharynx clear; neck supple, no JVP Lymph- no cervical lymphadenopathy Lungs- Clear to ausculation bilaterally, normal work of breathing.  No wheezes, rales, rhonchi Heart- Regular rate and rhythm, no murmurs, rubs or  gallops, PMI not laterally displaced GI- soft, non-tender, non-distended, bowel sounds present, no hepatosplenomegaly Extremities- no clubbing, cyanosis, or edema; DP/PT/radial pulses 2+ bilaterally MS- no significant deformity or atrophy Skin- warm and dry, no rash or lesion Psych- euthymic mood, full affect Neuro- strength and sensation are intact  EKG is ordered. Personal review of EKG from today shows ***  Additional studies reviewed include: Previous EP office notes. ***  Assessment and Plan:  Longstanding persistent atrial fibrillation Attempting to optimize rate control to preserve LV function HRs *** Continue metoprolol Continue diltiazem *** Continue digoxin 125 mcg daily    I do not think cardioversion is indicated at this time because of the longstanding persistent nature of her A. fib.   3. 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 Dr. Quentin Ore has reviewed and on several echoes and the aortic valve is appeared thickened on prior studies.  Recent blood cultures were negative.   4. HTN Stable on current regimen  Follow up with {Blank single:19197::"Dr. Allred","Dr. Arlan Organ. Klein","Dr. Camnitz","Dr. Lambert","EP APP"} in {Blank single:19197::"2 weeks","4 weeks","3 months","6 months","12 months","as usual post gen change"}   Shirley Friar, PA-C  06/04/21 11:03 AM

## 2021-06-05 ENCOUNTER — Ambulatory Visit: Payer: Self-pay | Admitting: Student

## 2021-06-11 ENCOUNTER — Other Ambulatory Visit: Payer: Self-pay

## 2021-06-12 ENCOUNTER — Other Ambulatory Visit: Payer: Self-pay

## 2021-06-13 ENCOUNTER — Other Ambulatory Visit: Payer: Self-pay

## 2021-06-18 ENCOUNTER — Encounter: Payer: Self-pay | Admitting: Internal Medicine

## 2021-06-18 ENCOUNTER — Ambulatory Visit: Payer: Medicaid Other | Attending: Internal Medicine | Admitting: Internal Medicine

## 2021-06-18 ENCOUNTER — Other Ambulatory Visit: Payer: Self-pay

## 2021-06-18 ENCOUNTER — Other Ambulatory Visit (HOSPITAL_COMMUNITY)
Admission: RE | Admit: 2021-06-18 | Discharge: 2021-06-18 | Disposition: A | Discharge status: Home / Self Care | Payer: Medicaid Other | Source: Ambulatory Visit | Attending: Internal Medicine | Admitting: Internal Medicine

## 2021-06-18 VITALS — BP 111/75 | HR 80 | Resp 16 | Wt 161.0 lb

## 2021-06-18 DIAGNOSIS — Z1231 Encounter for screening mammogram for malignant neoplasm of breast: Secondary | ICD-10-CM

## 2021-06-18 DIAGNOSIS — E11649 Type 2 diabetes mellitus with hypoglycemia without coma: Secondary | ICD-10-CM

## 2021-06-18 DIAGNOSIS — Z124 Encounter for screening for malignant neoplasm of cervix: Secondary | ICD-10-CM

## 2021-06-18 DIAGNOSIS — E1142 Type 2 diabetes mellitus with diabetic polyneuropathy: Secondary | ICD-10-CM

## 2021-06-18 MED ORDER — BASAGLAR KWIKPEN 100 UNIT/ML ~~LOC~~ SOPN
36.0000 [IU] | PEN_INJECTOR | Freq: Every day | SUBCUTANEOUS | 6 refills | Status: DC
  Filled 2021-06-18: qty 30, 83d supply, fill #0

## 2021-06-18 NOTE — Progress Notes (Signed)
Patient ID: Emily Farmer, female    DOB: August 23, 1955  MRN: [id number]  CC: Gynecologic Exam   Subjective: Emily Farmer is a 66 y.o. female who presents for pap Her concerns today include:  Patient with history of HTN, HL, PAF, chronic diastolic CHF, severe PHTN with moderate to severe tricuspid regurg DM type II with neuropathy, obesity, depression, IDA, pancreatitis due to TG, Klebsiella sepsis secondary to pyelonephritis  GYN History:  Pt is G1P1 Any hx of abn paps?:no Menses regular or irregular?: NA How long does menses last?  NA Menstrual flow light or heavy?: NA Method of birth control?:  NA Any vaginal dischg at this time?: no Dysuria?:  no Any hx of STI?: no Sexually active with how many partners: not sexually active Desires STI screen:  yes Last MMG: ordered in October.  Pt states she called the [facility] program but never called back.  She tried to call back but phone is out of service Family hx of uterine, cervical or breast cancer?:  no fhx of uterine/breast CA  Seen in ER 06/04/21 with hypoglycemic episode.  She took Humalog and forgot to eat.  Humalog changed to 10 units with meals.  No further episodes.   -checking BS 4x/day since then.  In a.m BS in the 170s. On Basaglar 34 units daily  Patient Active Problem List   Diagnosis Date Noted   Bacteremia 04/01/2021   Atrial fibrillation with RVR ([place]) 03/31/2021   Pulmonary hypertension ([place]) 03/19/2021   Hypomagnesemia    Sepsis ([place]) 02/27/2021   Atrial fibrillation with rapid ventricular response ([place]) 02/27/2021   Hypotension 02/27/2021   Secondary hypercoagulable state ([place]) 05/08/2020   Type 2 diabetes mellitus with diabetic polyneuropathy, with long-term current use of insulin ([place]) 09/23/2019   Uncontrolled type 2 diabetes mellitus with hyperglycemia, with long-term current use of insulin ([place]) 09/23/2019   Dyslipidemia 09/23/2019   Toenail fungus 07/19/2018   Depression 07/19/2018   Iron deficiency  anemia due to chronic blood loss 07/19/2018   Gastritis and duodenitis 07/19/2018   Chronic a-fib ([place]) 01/28/2018   Hyponatremia 01/28/2018   Chronic diastolic CHF (congestive heart failure) ([place]) 01/28/2018   Mixed hyperlipidemia 06/01/2017   Paroxysmal A-fib ([place]) 10/04/2016   Hypokalemia 01/04/2014   Acute pyelonephritis 02/15/2013   Essential hypertension 02/15/2013   Hypertriglyceridemia 02/15/2013   Diabetes mellitus type 2 in obese ([place]) 02/15/2013   Coronary atherosclerosis seen on CT 02/15/2013     Current Outpatient Medications on File Prior to Visit  Medication Sig Dispense Refill   acetaminophen (TYLENOL) 325 MG tablet Take 2 tablets (650 mg total) by mouth every 6 (six) hours as needed for mild pain (or Fever >/= 101).     apixaban (ELIQUIS) 5 MG TABS tablet Take 1 tablet (5 mg total) by mouth 2 (two) times daily. 60 tablet 4   atorvastatin (LIPITOR) 40 MG tablet Take 1 tablet (40 mg total) by mouth daily. 90 tablet 3   Blood Glucose Monitoring Suppl (TRUE METRIX METER) w/Device KIT Use to monitor blood sugar as directed (Patient taking differently: 1 each by Other route See admin instructions. Use to monitor blood sugar as directed) 1 kit 0   Continuous Blood Gluc Sensor (DEXCOM G6 SENSOR) MISC 1 Device by Does not apply route as directed. 3 each 11   Continuous Blood Gluc Transmit (DEXCOM G6 TRANSMITTER) MISC 1 Device by Does not apply route as directed. 1 each 3   digoxin (LANOXIN) 0.125 MG tablet Take 1 tablet (  0.125 mg total) by mouth daily. 90 tablet 3   diltiazem (CARDIZEM CD) 120 MG 24 hr capsule Take 1 capsule (120 mg total) by mouth daily. 90 capsule 3   empagliflozin (JARDIANCE) 10 MG TABS tablet Take 1 tablet (10 mg total) by mouth daily before breakfast. 90 tablet 1   ezetimibe (ZETIA) 10 MG tablet Take 1 tablet (10 mg total) by mouth daily. 30 tablet 0   ferrous sulfate 325 (65 FE) MG tablet Take 1 tablet (325 mg total) by mouth daily with breakfast. 30 tablet 4    glucose blood test strip USE AS INSTRUCTED TO CHECK BLOOD SUGARS 3 TIMES DAILY (Patient taking differently: 1 each by Other route See admin instructions. Check blood sugar 4 times daily) 100 each 11   Insulin Glargine (BASAGLAR KWIKPEN) 100 UNIT/ML Inject 34 Units into the skin at bedtime. 30 mL 6   insulin lispro (HUMALOG KWIKPEN) 100 UNIT/ML KwikPen Max daily 60 units (Patient taking differently: Inject 20 Units into the skin 3 (three) times daily.) 30 mL 6   Insulin Pen Needle 32G X 4 MM MISC use as directed in the morning, at noon, in the evening, and at bedtime. 400 each 3   Insulin Syringe-Needle U-100 (TRUEPLUS INSULIN SYRINGE) 31G X 5/16" 0.3 ML MISC Use to inject insulin daily. (Patient taking differently: 1 each by Other route See admin instructions. Use to inject insulin daily.) 100 each 11   liraglutide (VICTOZA) 18 MG/3ML SOPN Inject 1.2 mg into the skin daily. 18 mL 3   metFORMIN (GLUCOPHAGE) 500 MG tablet Take 0.5 tablets (250 mg total) by mouth 4 (four) times daily. (Patient not taking: Reported on 06/04/2021) 60 tablet 2   metoprolol succinate (TOPROL-XL) 100 MG 24 hr tablet Take 1 tablet (100 mg total) by mouth 2 (two) times daily. Take with or immediately following a meal. 180 tablet 3   Multiple Vitamins-Minerals (CENTRUM SILVER PO) Take 1 tablet by mouth daily.     No current facility-administered medications on file prior to visit.    No Known Allergies  Social History   Socioeconomic History   Marital status: Single    Spouse name: Not on file   Number of children: Not on file   Years of education: Not on file   Highest education level: Not on file  Occupational History   Not on file  Tobacco Use   Smoking status: Never   Smokeless tobacco: Never  Vaping Use   Vaping Use: Never used  Substance and Sexual Activity   Alcohol use: No   Drug use: No   Sexual activity: Not Currently  Other Topics Concern   Not on file  Social History Narrative   Lives with  her sister and does not need any assistance with ADLs.     Social Determinants of Health   Financial Resource Strain: Not on file  Food Insecurity: Not on file  Transportation Needs: Not on file  Physical Activity: Not on file  Stress: Not on file  Social Connections: Not on file  Intimate Partner Violence: Not on file    Family History  Problem Relation Age of Onset   Heart attack Mother    Heart attack Father    Diabetes Sister    High blood pressure Neg Hx    High Cholesterol Neg Hx     Past Surgical History:  Procedure Laterality Date   BIOPSY  06/20/2018   Procedure: BIOPSY;  Surgeon: Clarene Essex, MD;  Location: [facility];  Service: Endoscopy;;   CHOLECYSTECTOMY N/A 01/04/2014   Procedure: LAPAROSCOPIC CHOLECYSTECTOMY WITH INTRAOPERATIVE CHOLANGIOGRAM;  Surgeon: Ralene Ok, MD;  Location: [place];  Service: General;  Laterality: N/A;   COLONOSCOPY WITH PROPOFOL N/A 06/21/2018   Procedure: COLONOSCOPY WITH PROPOFOL;  Surgeon: Ronald Lobo, MD;  Location: [facility];  Service: Endoscopy;  Laterality: N/A;   ESOPHAGOGASTRODUODENOSCOPY (EGD) WITH PROPOFOL N/A 06/20/2018   Procedure: ESOPHAGOGASTRODUODENOSCOPY (EGD) WITH PROPOFOL;  Surgeon: Clarene Essex, MD;  Location: [facility];  Service: Endoscopy;  Laterality: N/A;   LUNG SURGERY      ROS: Review of Systems Negative except as stated above  PHYSICAL EXAM: BP 111/75    Pulse 80    Resp 16    Wt 161 lb (73 kg)    SpO2 97%    BMI 28.52 kg/m   Physical Exam  General appearance - alert, well appearing, and in no distress Mental status - normal mood, behavior, speech, dress, motor activity, and thought processes Breasts -CMA Boykin Reaper present as chaperone for breast and pelvic exam: Breasts appear normal, no suspicious masses, no skin or nipple changes or axillary nodes Pelvic - normal external genitalia, vulva, vagina, cervix, uterus and adnexa   CMP Latest Ref Rng & Units 06/04/2021 04/22/2021 04/21/2021   Glucose 70 - 99 mg/dL 222(H) 245(H) 127(H)  BUN 8 - 23 mg/dL 39(H) 21 23  Creatinine 0.44 - 1.00 mg/dL 1.11(H) 1.21(H) 0.85  Sodium 135 - 145 mmol/L 136 135 137  Potassium 3.5 - 5.1 mmol/L 4.0 4.1 4.0  Chloride 98 - 111 mmol/L 102 100 103  CO2 22 - 32 mmol/L '23 26 24  ' Calcium 8.9 - 10.3 mg/dL 9.7 9.4 8.9  Total Protein 6.5 - 8.1 g/dL 7.6 - -  Total Bilirubin 0.3 - 1.2 mg/dL 0.9 - -  Alkaline Phos 38 - 126 U/L 97 - -  AST 15 - 41 U/L 32 - -  ALT 0 - 44 U/L 25 - -   Lipid Panel     Component Value Date/Time   CHOL 132 04/21/2021 0143   CHOL 214 (H) 06/05/2020 0837   TRIG 263 (H) 04/21/2021 0143   HDL 33 (L) 04/21/2021 0143   HDL 40 06/05/2020 0837   CHOLHDL 4.0 04/21/2021 0143   VLDL 53 (H) 04/21/2021 0143   LDLCALC 46 04/21/2021 0143   LDLCALC 110 (H) 06/05/2020 0837   LDLDIRECT 137 (H) 03/01/2020 1012   LDLDIRECT 96.0 09/23/2019 0828    CBC    Component Value Date/Time   WBC 12.9 (H) 06/04/2021 0017   RBC 4.90 06/04/2021 0017   HGB 14.4 06/04/2021 0017   HGB 12.6 04/11/2021 1447   HCT 44.5 06/04/2021 0017   HCT 39.0 04/11/2021 1447   PLT 255 06/04/2021 0017   PLT 421 04/11/2021 1447   MCV 90.8 06/04/2021 0017   MCV 87 04/11/2021 1447   MCH 29.4 06/04/2021 0017   MCHC 32.4 06/04/2021 0017   RDW 14.1 06/04/2021 0017   RDW 13.8 04/11/2021 1447   LYMPHSABS 1.1 06/04/2021 0017   LYMPHSABS 1.9 04/11/2021 1447   MONOABS 0.3 06/04/2021 0017   EOSABS 0.0 06/04/2021 0017   EOSABS 0.4 04/11/2021 1447   BASOSABS 0.0 06/04/2021 0017   BASOSABS 0.0 04/11/2021 1447    ASSESSMENT AND PLAN: 1. Pap smear for cervical cancer screening - Cytology - PAP - Cervicovaginal ancillary only  2. Hypoglycemia due to type 2 diabetes mellitus ([place]) Discussed the importance of taking the Humalog right at the time that  she eats.  Advised that she should eat within a few minutes of taking the medication.  Advised to purchase glucose tablets and keep them on her at all times to use if  she is away from home and develops hypoglycemia.  Given her morning blood sugar readings, I recommend increasing Basaglar from 34 to 36 units daily.  3. Encounter for screening mammogram for malignant neoplasm of breast Patient given the phone number to call [facility] about getting scheduled for MMG  Patient was given the opportunity to ask questions.  Patient verbalized understanding of the plan and was able to repeat key elements of the plan.   No orders of the defined types were placed in this encounter.    Requested Prescriptions    No prescriptions requested or ordered in this encounter    No follow-ups on file.  Karle Plumber, MD, FACP

## 2021-06-18 NOTE — Patient Instructions (Signed)
Please call the [facility] program in regards to your mammogram at [phone]

## 2021-06-19 ENCOUNTER — Other Ambulatory Visit: Payer: Self-pay | Admitting: Internal Medicine

## 2021-06-19 ENCOUNTER — Other Ambulatory Visit: Payer: Self-pay

## 2021-06-19 ENCOUNTER — Ambulatory Visit (HOSPITAL_COMMUNITY): Payer: Self-pay | Attending: Cardiology

## 2021-06-19 ENCOUNTER — Ambulatory Visit (INDEPENDENT_AMBULATORY_CARE_PROVIDER_SITE_OTHER): Payer: Self-pay | Admitting: Student

## 2021-06-19 ENCOUNTER — Encounter: Payer: Self-pay | Admitting: Student

## 2021-06-19 VITALS — BP 104/62 | HR 77 | Ht 63.0 in | Wt 160.6 lb

## 2021-06-19 DIAGNOSIS — I10 Essential (primary) hypertension: Secondary | ICD-10-CM

## 2021-06-19 DIAGNOSIS — I4819 Other persistent atrial fibrillation: Secondary | ICD-10-CM

## 2021-06-19 DIAGNOSIS — I272 Pulmonary hypertension, unspecified: Secondary | ICD-10-CM | POA: Insufficient documentation

## 2021-06-19 DIAGNOSIS — I5032 Chronic diastolic (congestive) heart failure: Secondary | ICD-10-CM

## 2021-06-19 LAB — CERVICOVAGINAL ANCILLARY ONLY
Bacterial Vaginitis (gardnerella): NEGATIVE
Candida Glabrata: POSITIVE — AB
Candida Vaginitis: NEGATIVE
Chlamydia: NEGATIVE
Comment: NEGATIVE
Comment: NEGATIVE
Comment: NEGATIVE
Comment: NEGATIVE
Comment: NEGATIVE
Comment: NORMAL
Neisseria Gonorrhea: NEGATIVE
Trichomonas: NEGATIVE

## 2021-06-19 LAB — ECHOCARDIOGRAM LIMITED
Height: 63 in
S' Lateral: 2.4 cm
Weight: 2569.6 oz

## 2021-06-19 MED ORDER — FLUCONAZOLE 150 MG PO TABS
150.0000 mg | ORAL_TABLET | Freq: Once | ORAL | 0 refills | Status: AC
  Filled 2021-06-19: qty 1, 1d supply, fill #0

## 2021-06-19 NOTE — Patient Instructions (Signed)
Medication Instructions:  Your physician recommends that you continue on your current medications as directed. Please refer to the Current Medication list given to you today.  *If you need a refill on your cardiac medications before your next appointment, please call your pharmacy*   Lab Work: None If you have labs (blood work) drawn today and your tests are completely normal, you will receive your results only by: MyChart Message (if you have MyChart) OR A paper copy in the mail If you have any lab test that is abnormal or we need to change your treatment, we will call you to review the results.   Follow-Up: At [facility], you and your health needs are our priority.  As part of our continuing mission to provide you with exceptional heart care, we have created designated Provider Care Teams.  These Care Teams include your primary Cardiologist (physician) and Advanced Practice Providers (APPs -  Physician Assistants and Nurse Practitioners) who all work together to provide you with the care you need, when you need it.  We recommend signing up for the patient portal called "MyChart".  Sign up information is provided on this After Visit Summary.  MyChart is used to connect with patients for Virtual Visits (Telemedicine).  Patients are able to view lab/test results, encounter notes, upcoming appointments, etc.  Non-urgent messages can be sent to your provider as well.   To learn more about what you can do with MyChart, go to [email or web address].    Your next appointment:   3 month(s)  The format for your next appointment:   In Person  Provider:   Cameron Lambert, MD{   

## 2021-06-19 NOTE — Progress Notes (Signed)
PCP:  Ladell Pier, MD Primary Cardiologist: Pixie Casino, MD Electrophysiologist: Vickie Epley, MD   Emily Farmer is a 66 y.o. female seen today for Vickie Epley, MD for routine electrophysiology followup.  Since last being seen in our clinic the patient reports doing very well. She has chronic dyspnea with more than moderate exertion, or less up stairs/incline.  she denies chest pain, palpitations, PND, orthopnea, nausea, vomiting, dizziness, syncope, edema, weight gain, or early satiety.  Past Medical History:  Diagnosis Date   Arthritis    Atrial fibrillation ([place])    a. 09/2016 in setting of pancreatitis;  b. 09/2016 Echo: EF 65-60%, no rwma, Gr2 DD, mild MR, triv TR, PASP 83mHg;  CHA2DS2VASc = 4-->Eliquis 551mBID. c. Recurred 06/2018 in setting of GIB.   Chronic diastolic CHF (congestive heart failure) [place]/15[place]   Diabetes mellitus    GI bleeding 06/2018   Gout    Hyperlipidemia    Hypertension    Hypertriglyceridemia    Pancreatitis    a. 09/2016 - Triglycerides 1,392 on admission.   Past Surgical History:  Procedure Laterality Date   BIOPSY  06/20/2018   Procedure: BIOPSY;  Surgeon: MaClarene EssexMD;  [facility] Service: Endoscopy;;   CHOLECYSTECTOMY N/A 01/04/2014   Procedure: LAPAROSCOPIC CHOLECYSTECTOMY WITH INTRAOPERATIVE CHOLANGIOGRAM;  Surgeon: ArRalene OkMD;  [facility] Service: General;  Laterality: N/A;   COLONOSCOPY WITH PROPOFOL N/A 06/21/2018   Procedure: COLONOSCOPY WITH PROPOFOL;  Surgeon: BuRonald LoboMD;  [place] [facility] Service: Endoscopy;  Laterality: N/A;   ESOPHAGOGASTRODUODENOSCOPY (EGD) WITH PROPOFOL N/A 06/20/2018   Procedure: ESOPHAGOGASTRODUODENOSCOPY (EGD) WITH PROPOFOL;  Surgeon: MaClarene EssexMD;  [place] [facility] Service: Endoscopy;  Laterality: N/A;   LUNG SURGERY      Current Outpatient Medications  Medication Sig Dispense Refill   acetaminophen (TYLENOL) 325 MG tablet Take 2  tablets (650 mg total) by mouth every 6 (six) hours as needed for mild pain (or Fever >/= 101).     apixaban (ELIQUIS) 5 MG TABS tablet Take 1 tablet (5 mg total) by mouth 2 (two) times daily. 60 tablet 4   atorvastatin (LIPITOR) 40 MG tablet Take 1 tablet (40 mg total) by mouth daily. 90 tablet 3   Blood Glucose Monitoring Suppl (TRUE METRIX METER) w/Device KIT Use to monitor blood sugar as directed (Patient taking differently: 1 each by Other route See admin instructions. Use to monitor blood sugar as directed) 1 kit 0   Continuous Blood Gluc Sensor (DEXCOM G6 SENSOR) MISC 1 Device by Does not apply route as directed. 3 each 11   Continuous Blood Gluc Transmit (DEXCOM G6 TRANSMITTER) MISC 1 Device by Does not apply route as directed. 1 each 3   digoxin (LANOXIN) 0.125 MG tablet Take 1 tablet (0.125 mg total) by mouth daily. 90 tablet 3   diltiazem (CARDIZEM CD) 120 MG 24 hr capsule Take 1 capsule (120 mg total) by mouth daily. 90 capsule 3   empagliflozin (JARDIANCE) 10 MG TABS tablet Take 1 tablet (10 mg total) by mouth daily before breakfast. 90 tablet 1   ezetimibe (ZETIA) 10 MG tablet Take 1 tablet (10 mg total) by mouth daily. 30 tablet 0   ferrous sulfate 325 (65 FE) MG tablet Take 1 tablet (325 mg total) by mouth daily with breakfast. 30 tablet 4   glucose blood test strip USE AS INSTRUCTED TO CHECK BLOOD SUGARS 3 TIMES DAILY (Patient taking differently: 1  each by Other route See admin instructions. Check blood sugar 4 times daily) 100 each 11   Insulin Glargine (BASAGLAR KWIKPEN) 100 UNIT/ML Inject 36 Units into the skin at bedtime. 30 mL 6   insulin lispro (HUMALOG KWIKPEN) 100 UNIT/ML KwikPen Max daily 60 units (Patient taking differently: Inject 20 Units into the skin 3 (three) times daily.) 30 mL 6   Insulin Pen Needle 32G X 4 MM MISC use as directed in the morning, at noon, in the evening, and at bedtime. 400 each 3   Insulin Syringe-Needle U-100 (TRUEPLUS INSULIN SYRINGE) 31G X 5/16"  0.3 ML MISC Use to inject insulin daily. (Patient taking differently: 1 each by Other route See admin instructions. Use to inject insulin daily.) 100 each 11   liraglutide (VICTOZA) 18 MG/3ML SOPN Inject 1.2 mg into the skin daily. 18 mL 3   metFORMIN (GLUCOPHAGE) 500 MG tablet Take 0.5 tablets (250 mg total) by mouth 4 (four) times daily. 60 tablet 2   metoprolol succinate (TOPROL-XL) 100 MG 24 hr tablet Take 1 tablet (100 mg total) by mouth 2 (two) times daily. Take with or immediately following a meal. 180 tablet 3   Multiple Vitamins-Minerals (CENTRUM SILVER PO) Take 1 tablet by mouth daily.     No current facility-administered medications for this visit.    No Known Allergies  Social History   Socioeconomic History   Marital status: Single    Spouse name: Not on file   Number of children: Not on file   Years of education: Not on file   Highest education level: Not on file  Occupational History   Not on file  Tobacco Use   Smoking status: Never   Smokeless tobacco: Never  Vaping Use   Vaping Use: Never used  Substance and Sexual Activity   Alcohol use: No   Drug use: No   Sexual activity: Not Currently  Other Topics Concern   Not on file  Social History Narrative   Lives with her sister and does not need any assistance with ADLs.     Social Determinants of Health   Financial Resource Strain: Not on file  Food Insecurity: Not on file  Transportation Needs: Not on file  Physical Activity: Not on file  Stress: Not on file  Social Connections: Not on file  Intimate Partner Violence: Not on file     Review of Systems: All other systems reviewed and are otherwise negative except as noted above.  Physical Exam: Vitals:   06/19/21 1124  BP: 104/62  Pulse: 77  SpO2: 97%  Weight: 160 lb 9.6 oz (72.8 kg)  Height: '5\' 3"'  (1.6 m)    GEN- The patient is well appearing, alert and oriented x 3 today.   HEENT: normocephalic, atraumatic; sclera clear, conjunctiva pink;  hearing intact; oropharynx clear; neck supple, no JVP Lymph- no cervical lymphadenopathy Lungs- Clear to ausculation bilaterally, normal work of breathing.  No wheezes, rales, rhonchi Heart- Irregularly irregular rate and rhythm, no murmurs, rubs or gallops, PMI not laterally displaced GI- soft, non-tender, non-distended, bowel sounds present, no hepatosplenomegaly Extremities- no clubbing, cyanosis, or edema; DP/PT/radial pulses 2+ bilaterally MS- no significant deformity or atrophy Skin- warm and dry, no rash or lesion Psych- euthymic mood, full affect Neuro- strength and sensation are intact  EKG is not ordered. Personal review of EKG from 06/06/2021 shows AF at 120 bpm  Additional studies reviewed include: Previous EP office notes.   Assessment and Plan:  Longstanding persistent atrial fibrillation  Attempting to optimize rate control to preserve LV function HRs much better controlled and in 70s today Continue metoprolol Continue diltiazem 120 mg daily for now Continue digoxin 125 mcg daily    Do not think cardioversion is indicated at this time because of the longstanding persistent nature of her A. fib.   3. 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 Dr. Quentin Ore has reviewed and on several echoes and the aortic valve is appeared thickened on prior studies.  Recent blood cultures were negative.   4. HTN Stable on current regimen   Follow up with Dr. Quentin Ore in 3 months   Shirley Friar, PA-C  06/19/21 12:23 PM

## 2021-06-20 ENCOUNTER — Ambulatory Visit: Payer: Self-pay | Admitting: Student

## 2021-06-20 ENCOUNTER — Other Ambulatory Visit: Payer: Self-pay

## 2021-06-20 ENCOUNTER — Other Ambulatory Visit (HOSPITAL_COMMUNITY): Payer: Self-pay

## 2021-06-20 ENCOUNTER — Telehealth: Payer: Self-pay

## 2021-06-20 LAB — CYTOLOGY - PAP
Adequacy: ABSENT
Comment: NEGATIVE
Diagnosis: NEGATIVE
High risk HPV: NEGATIVE

## 2021-06-20 MED ORDER — FLUCONAZOLE 150 MG PO TABS
150.0000 mg | ORAL_TABLET | Freq: Once | ORAL | 0 refills | Status: AC
  Filled 2021-06-20: qty 1, 1d supply, fill #0

## 2021-06-20 NOTE — Telephone Encounter (Signed)
Contacted pt to go over lab results pt is aware and doesn't have any questions or concerns 

## 2021-06-21 ENCOUNTER — Other Ambulatory Visit (HOSPITAL_COMMUNITY): Payer: Self-pay

## 2021-06-21 ENCOUNTER — Telehealth: Payer: Self-pay

## 2021-06-21 NOTE — Telephone Encounter (Signed)
Contacted pt to go over lab results pt is aware and doesn't have any questions or concerns 

## 2021-07-11 ENCOUNTER — Ambulatory Visit: Payer: Self-pay | Admitting: Pharmacist

## 2021-07-22 ENCOUNTER — Other Ambulatory Visit: Payer: Self-pay

## 2021-07-22 ENCOUNTER — Ambulatory Visit (HOSPITAL_BASED_OUTPATIENT_CLINIC_OR_DEPARTMENT_OTHER): Payer: Self-pay | Admitting: Family

## 2021-07-22 ENCOUNTER — Encounter (HOSPITAL_BASED_OUTPATIENT_CLINIC_OR_DEPARTMENT_OTHER): Payer: Self-pay | Admitting: Family

## 2021-07-22 VITALS — BP 122/70 | HR 73 | Ht 63.0 in | Wt 159.0 lb

## 2021-07-22 DIAGNOSIS — E785 Hyperlipidemia, unspecified: Secondary | ICD-10-CM

## 2021-07-22 DIAGNOSIS — I4819 Other persistent atrial fibrillation: Secondary | ICD-10-CM

## 2021-07-22 DIAGNOSIS — D6859 Other primary thrombophilia: Secondary | ICD-10-CM

## 2021-07-22 DIAGNOSIS — I5032 Chronic diastolic (congestive) heart failure: Secondary | ICD-10-CM

## 2021-07-22 DIAGNOSIS — G4733 Obstructive sleep apnea (adult) (pediatric): Secondary | ICD-10-CM

## 2021-07-22 DIAGNOSIS — I38 Endocarditis, valve unspecified: Secondary | ICD-10-CM

## 2021-07-22 DIAGNOSIS — I10 Essential (primary) hypertension: Secondary | ICD-10-CM

## 2021-07-22 MED ORDER — EZETIMIBE 10 MG PO TABS
10.0000 mg | ORAL_TABLET | Freq: Every day | ORAL | 3 refills | Status: DC
  Filled 2021-07-22: qty 30, 30d supply, fill #0

## 2021-07-22 MED ORDER — BLOOD PRESSURE MONITOR/ARM DEVI
0 refills | Status: AC
Start: 2021-07-22 — End: ?
  Filled 2021-07-22: qty 1, fill #0

## 2021-07-22 MED ORDER — APIXABAN 5 MG PO TABS
5.0000 mg | ORAL_TABLET | Freq: Two times a day (BID) | ORAL | 5 refills | Status: DC
  Filled 2021-07-22: qty 60, 30d supply, fill #0
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
## 2021-07-22 NOTE — Patient Instructions (Addendum)
Medication Instructions:  Your Physician recommend you continue on your current medication as directed.    *If you need a refill on your cardiac medications before your next appointment, please call your pharmacy*   Lab Work: None ordered today   Testing/Procedures: None ordered today    Follow-Up: At [facility], you and your health needs are our priority.  As part of our continuing mission to provide you with exceptional heart care, we have created designated Provider Care Teams.  These Care Teams include your primary Cardiologist (physician) and Advanced Practice Providers (APPs -  Physician Assistants and Nurse Practitioners) who all work together to provide you with the care you need, when you need it.  We recommend signing up for the patient portal called "MyChart".  Sign up information is provided on this After Visit Summary.  MyChart is used to connect with patients for Virtual Visits (Telemedicine).  Patients are able to view lab/test results, encounter notes, upcoming appointments, etc.  Non-urgent messages can be sent to your provider as well.   To learn more about what you can do with MyChart, go to [email or web address].    Your next appointment:    6 month(s)  The format for your next appointment:   In Person  Provider:   Chrystie Nose, MD {

## 2021-07-23 ENCOUNTER — Other Ambulatory Visit: Payer: Self-pay

## 2021-07-23 ENCOUNTER — Inpatient Hospital Stay: Payer: Medicaid Other | Admitting: Internal Medicine

## 2021-07-23 ENCOUNTER — Ambulatory Visit: Payer: Self-pay | Attending: Internal Medicine | Admitting: Pharmacist

## 2021-07-23 DIAGNOSIS — E1165 Type 2 diabetes mellitus with hyperglycemia: Secondary | ICD-10-CM

## 2021-07-23 DIAGNOSIS — Z794 Long term (current) use of insulin: Secondary | ICD-10-CM

## 2021-07-23 MED ORDER — BASAGLAR KWIKPEN 100 UNIT/ML ~~LOC~~ SOPN
38.0000 [IU] | PEN_INJECTOR | Freq: Every day | SUBCUTANEOUS | 2 refills | Status: DC
  Filled 2021-07-23: qty 12, 31d supply, fill #0
  Filled 2021-10-15: qty 12, 31d supply, fill #1

## 2021-07-23 NOTE — Progress Notes (Signed)
° °  S:    PCP: Dr. Wynetta Emery  No chief complaint on file.  Patient arrives in good spirits.  Presents for diabetes management at the request of Dr. Wynetta Emery. Patient was referred on 06/18/2021.  Family/Social History:  - FHx: diabetes (sister); heart attack (mom, dad) - Tobacco: never smoker - Alcohol: denies using alcohol  Insurance coverage/medication affordability:  - DNBI  Patient reports adherence with medications.  Current diabetes medications include:  - Humalog (reports taking 10 units with each meal) - Jardiance 10 mg daily  - Basaglar 34 units daily - Victoza 1.2 mg daily  Patient denies hypoglycemic events.   Patient reported dietary habits:  - Eats 3 meals/day - Breakfast: cereal or eggs - Lunch: tuna fish or soup - Dinner:steak and potatoes - Drinks: Gatorade - Denies eating sweets.  Patient-reported exercise habits: nothing outside of work   Patient reports nocturia.  Patient denies neuropathy. Patient denies visual changes. Patient reports self foot exams.   O:   No meter with her today. Tells me that she will see the occasional 300-400 but for the most part, she feels her home CBGs are improving since Jardiance was added to her regimen.  Home fasting CBG: reports 127 this morning. 120 - 190s.   2 hour post-prandial/random CBG: mostly 200s.   Lab Results  Component Value Date   HGBA1C 10.1 (A) 05/07/2021   There were no vitals filed for this visit.  Lipid Panel     Component Value Date/Time   CHOL 132 04/21/2021 0143   CHOL 214 (H) 06/05/2020 0837   TRIG 263 (H) 04/21/2021 0143   HDL 33 (L) 04/21/2021 0143   HDL 40 06/05/2020 0837   CHOLHDL 4.0 04/21/2021 0143   VLDL 53 (H) 04/21/2021 0143   LDLCALC 46 04/21/2021 0143   LDLCALC 110 (H) 06/05/2020 0837   LDLDIRECT 137 (H) 03/01/2020 1012   LDLDIRECT 96.0 09/23/2019 0828   Clinical ASCVD: YES - CAD The 10-year ASCVD risk score (Arnett DK, et al., 2019) is: 11.5%   Values used to calculate  the score:     Age: 2 years     Sex: Female     Is Non-Hispanic African American: No     Diabetic: Yes     Tobacco smoker: No     Systolic Blood Pressure: 123XX123 mmHg     Is BP treated: Yes     HDL Cholesterol: 33 mg/dL     Total Cholesterol: 132 mg/dL   A/P: Diabetes longstanding currently uncontrolled. Patient is able to verbalize appropriate hypoglycemia management plan. Patient is adherent with medication.  -Increase Basaglar to 38 units daily.  -Continue other medications at current doses.  -Extensively discussed pathophysiology of DM, recommended lifestyle interventions, dietary effects on glycemic control -Counseled on s/sx of and management of hypoglycemia -Next A1C anticipated 07/2021.   Written patient instructions provided.  Total time in face to face counseling 30 minutes.   Follow up w/ me next month. Will plan on getting an A1c at that visit.      Benard Halsted, PharmD, [place], [place] [phone]

## 2021-07-29 ENCOUNTER — Other Ambulatory Visit: Payer: Self-pay

## 2021-08-05 ENCOUNTER — Other Ambulatory Visit: Payer: Self-pay

## 2021-08-05 ENCOUNTER — Telehealth: Payer: Self-pay | Admitting: Cardiology

## 2021-08-05 MED ORDER — DIGOXIN 125 MCG PO TABS
0.1250 mg | ORAL_TABLET | Freq: Every day | ORAL | 3 refills | Status: DC
  Filled 2021-08-05: qty 30, 30d supply, fill #0
  Filled 2021-09-03: qty 30, 30d supply, fill #1
  Filled 2021-10-15: qty 30, 30d supply, fill #2
  Filled 2021-12-02 – 2021-12-09 (×2): qty 30, 30d supply, fill #3
  Filled 2022-01-23: qty 30, 30d supply, fill #4

## 2021-08-05 NOTE — Telephone Encounter (Signed)
°*  STAT* If patient is at the pharmacy, call can be transferred to refill team.   1. Which medications need to be refilled? (please list name of each medication and dose if known) digoxin (LANOXIN) 0.125 MG tablet  2. Which pharmacy/location (including street and city if local pharmacy) is medication to be sent to? [facility]  3. Do they need a 30 day or 90 day supply? 90 day supply     Pharmacy states the Rx number is wrong

## 2021-08-05 NOTE — Telephone Encounter (Signed)
Pt's medication was sent to pt's pharmacy as requested. Confirmation received.  °

## 2021-08-06 ENCOUNTER — Other Ambulatory Visit: Payer: Self-pay

## 2021-08-07 ENCOUNTER — Other Ambulatory Visit: Payer: Self-pay

## 2021-08-29 ENCOUNTER — Ambulatory Visit: Payer: Medicaid Other | Admitting: Pharmacist

## 2021-09-03 ENCOUNTER — Other Ambulatory Visit: Payer: Self-pay

## 2021-09-04 ENCOUNTER — Other Ambulatory Visit: Payer: Self-pay

## 2021-09-05 ENCOUNTER — Other Ambulatory Visit: Payer: Self-pay

## 2021-10-03 NOTE — Progress Notes (Signed)
?Electrophysiology Office Follow up Visit Note:   ? ?Date:  10/04/2021  ? ?ID:  Emily Farmer, DOB 1956/02/17, MRN [id number] ? ?PCP:  Emily Pier, MD  ?[facility] Cardiologist:  Emily Casino, MD  ?[facility] Electrophysiologist:  Emily Epley, MD  ? ? ?Interval History:   ? ?Emily Farmer is a 66 y.o. female who presents for a follow up visit. They were last seen in clinic May 22, 2021 for her history of atrial fibrillation.  At that appointment her ventricular rates were not well controlled while in atrial fibrillation.  Digoxin was added.  There is also concern of aortic valve vegetation on the limited echocardiogram.  She saw Emily Farmer on June 19, 2021 in follow-up.  At that appointment her ventricular rates were much better controlled.  She saw Emily Farmer on July 22, 2021.  At that appointment she was doing well.  She presents today for follow-up. ?She is doing well today.  No problems with her medications.  She confirms she is taking Eliquis twice daily. ? ? ?  ? ?Past Medical History:  ?Diagnosis Date  ? Arthritis   ? Atrial fibrillation ([place])   ? a. 09/2016 in setting of pancreatitis;  b. 09/2016 Echo: EF 65-60%, no rwma, Gr2 DD, mild MR, triv TR, PASP 22mHg;  CHA2DS2VASc = 4-->Eliquis 549mBID. c. Recurred 06/2018 in setting of GIB.  ? Chronic diastolic CHF (congestive heart failure) ([place]8/15/2019  ? Diabetes mellitus   ? GI bleeding 06/2018  ? Gout   ? Hyperlipidemia   ? Hypertension   ? Hypertriglyceridemia   ? Pancreatitis   ? a. 09/2016 - Triglycerides 1,392 on admission.  ? ? ?Past Surgical History:  ?Procedure Laterality Date  ? BIOPSY  06/20/2018  ? Procedure: BIOPSY;  Surgeon: MaClarene EssexMD;  [place] [facility]: [place];;  ? CHOLECYSTECTOMY N/A 01/04/2014  ? Procedure: LAPAROSCOPIC CHOLECYSTECTOMY WITH INTRAOPERATIVE CHOLANGIOGRAM;  Surgeon: ArRalene OkMD;  [place] [facility] Service: General;  Laterality: N/A;  ? COLONOSCOPY WITH PROPOFOL N/A  06/21/2018  ? Procedure: COLONOSCOPY WITH PROPOFOL;  Surgeon: BuRonald LoboMD;  [place] [facility]: [place];  Laterality: N/A;  ? ESOPHAGOGASTRODUODENOSCOPY (EGD) WITH PROPOFOL N/A 06/20/2018  ? Procedure: ESOPHAGOGASTRODUODENOSCOPY (EGD) WITH PROPOFOL;  Surgeon: MaClarene EssexMD;  [place] [facility]: [place];  Laterality: N/A;  ? LUNG SURGERY    ? ? ?Current Medications: ?Current Meds  ?Medication Sig  ? acetaminophen (TYLENOL) 325 MG tablet Take 2 tablets (650 mg total) by mouth every 6 (six) hours as needed for mild pain (or Fever >/= 101).  ? apixaban (ELIQUIS) 5 MG TABS tablet Take 1 tablet (5 mg total) by mouth 2 (two) times daily.  ? atorvastatin (LIPITOR) 40 MG tablet Take 1 tablet (40 mg total) by mouth daily.  ? Blood Glucose Monitoring Suppl (TRUE METRIX METER) w/Device KIT Use to monitor blood sugar as directed (Patient taking differently: 1 each by Other route See admin instructions. Use to monitor blood sugar as directed)  ? Blood Pressure Monitoring (BLOOD PRESSURE MONITOR/ARM) DEVI Check blood pressure once per day at least 2 hours after medications.  ? Continuous Blood Gluc Sensor (DEXCOM G6 SENSOR) MISC 1 Device by Does not apply route as directed.  ? Continuous Blood Gluc Transmit (DEXCOM G6 TRANSMITTER) MISC 1 Device by Does not apply route as directed.  ? digoxin (LANOXIN) 0.125 MG tablet Take 1 tablet (0.125 mg total) by mouth daily.  ? diltiazem (  CARDIZEM CD) 120 MG 24 hr capsule Take 1 capsule (120 mg total) by mouth daily.  ? empagliflozin (JARDIANCE) 10 MG TABS tablet Take 1 tablet (10 mg total) by mouth daily before breakfast.  ? ezetimibe (ZETIA) 10 MG tablet Take 1 tablet (10 mg total) by mouth daily.  ? ferrous sulfate 325 (65 FE) MG tablet Take 1 tablet (325 mg total) by mouth daily with breakfast.  ? glucose blood test strip USE AS INSTRUCTED TO CHECK BLOOD SUGARS 3 TIMES DAILY (Patient taking differently: 1 each by Other route See admin instructions.  Check blood sugar 4 times daily)  ? Insulin Glargine (BASAGLAR KWIKPEN) 100 UNIT/ML Inject 38 Units into the skin at bedtime.  ? insulin lispro (HUMALOG KWIKPEN) 100 UNIT/ML KwikPen Max daily 60 units (Patient taking differently: Inject 20 Units into the skin 3 (three) times daily.)  ? Insulin Pen Needle 32G X 4 MM MISC use as directed in the morning, at noon, in the evening, and at bedtime.  ? Insulin Syringe-Needle U-100 (TRUEPLUS INSULIN SYRINGE) 31G X 5/16" 0.3 ML MISC Use to inject insulin daily. (Patient taking differently: 1 each by Other route See admin instructions. Use to inject insulin daily.)  ? liraglutide (VICTOZA) 18 MG/3ML SOPN Inject 1.2 mg into the skin daily.  ? metFORMIN (GLUCOPHAGE) 500 MG tablet Take 0.5 tablets (250 mg total) by mouth 4 (four) times daily.  ? metoprolol succinate (TOPROL-XL) 100 MG 24 hr tablet Take 1 tablet (100 mg total) by mouth 2 (two) times daily. Take with or immediately following a meal.  ? Multiple Vitamins-Minerals (CENTRUM SILVER PO) Take 1 tablet by mouth daily.  ?  ? ?Allergies:   Patient has no known allergies.  ? ?Social History  ? ?Socioeconomic History  ? Marital status: Single  ?  Spouse name: Not on file  ? Number of children: Not on file  ? Years of education: Not on file  ? Highest education level: Not on file  ?Occupational History  ? Not on file  ?Tobacco Use  ? Smoking status: Never  ? Smokeless tobacco: Never  ?Vaping Use  ? Vaping Use: Never used  ?Substance and Sexual Activity  ? Alcohol use: No  ? Drug use: No  ? Sexual activity: Not Currently  ?Other Topics Concern  ? Not on file  ?Social History Narrative  ? Lives with her sister and does not need any assistance with ADLs.    ? ?Social Determinants of Health  ? ?Financial Resource Strain: Not on file  ?Food Insecurity: Not on file  ?Transportation Needs: Not on file  ?Physical Activity: Not on file  ?Stress: Not on file  ?Social Connections: Not on file  ?  ? ?Family History: ?The patient's family  history includes Diabetes in her sister; Heart attack in her father and mother. There is no history of High blood pressure or High Cholesterol. ? ?ROS:   ?Please see the history of present illness.    ?All other systems reviewed and are negative. ? ?EKGs/Labs/Other Studies Reviewed:   ? ?The following studies were reviewed today: ? ? ? ? ?Recent Labs: ?03/30/2021: B Natriuretic Peptide 189.8 ?04/21/2021: TSH 0.633 ?04/22/2021: Magnesium 2.1 ?06/04/2021: ALT 25; BUN 39; Creatinine, Ser 1.11; Hemoglobin 14.4; Platelets 255; Potassium 4.0; Sodium 136  ?Recent Lipid Panel ?   ?Component Value Date/Time  ? CHOL 132 04/21/2021 0143  ? CHOL 214 (H) 06/05/2020 0837  ? TRIG 263 (H) 04/21/2021 0143  ? HDL 33 (L) 04/21/2021 0143  ?  HDL 40 06/05/2020 0837  ? CHOLHDL 4.0 04/21/2021 0143  ? VLDL 53 (H) 04/21/2021 0143  ? LDLCALC 46 04/21/2021 0143  ? [place] 110 (H) 06/05/2020 0479  ? LDLDIRECT 137 (H) 03/01/2020 1012  ? LDLDIRECT 96.0 09/23/2019 0828  ? ? ?Physical Exam:   ? ?VS:  BP 100/62   Pulse 80   Ht _0  (1.6 m)   Wt 151 lb 9.6 oz (68.8 kg)   SpO2 95%   BMI 26.85 kg/m?    ? ?Wt Readings from Last 3 Encounters:  ?10/04/21 151 lb 9.6 oz (68.8 kg)  ?07/22/21 159 lb (72.1 kg)  ?06/19/21 160 lb 9.6 oz (72.8 kg)  ?  ? ?GEN:  Well nourished, well developed in no acute distress ?HEENT: Normal ?NECK: No JVD; No carotid bruits ?LYMPHATICS: No lymphadenopathy ?CARDIAC: Irregularly irregular, no murmurs, rubs, gallops ?RESPIRATORY:  Clear to auscultation without rales, wheezing or rhonchi  ?ABDOMEN: Soft, non-tender, non-distended ?MUSCULOSKELETAL:  No edema; No deformity  ?SKIN: Warm and dry ?NEUROLOGIC:  Alert and oriented x 3 ?PSYCHIATRIC:  Normal affect  ? ? ? ?  ? ?ASSESSMENT:   ? ?1. Permanent atrial fibrillation ([place])   ?2. Chronic diastolic CHF (congestive heart failure) ([place])   ?3. Diabetes mellitus type 2 in obese [facility])   ? ?PLAN:   ? ?In order of problems listed above: ? ?#Permanent atrial fibrillation ?Rate  controlled ?On anticoagulation ?Follow-up with primary cardiologist and primary care physician ? ?#Chronic diastolic heart failure ?NYHA class II-III.  Warm dry on exam.  Continue current medical therapy. ? ?Follow-up

## 2021-10-04 ENCOUNTER — Ambulatory Visit (INDEPENDENT_AMBULATORY_CARE_PROVIDER_SITE_OTHER): Payer: Self-pay | Admitting: Cardiology

## 2021-10-04 ENCOUNTER — Encounter: Payer: Self-pay | Admitting: Cardiology

## 2021-10-04 VITALS — BP 100/62 | HR 80 | Ht 63.0 in | Wt 151.6 lb

## 2021-10-04 DIAGNOSIS — E1169 Type 2 diabetes mellitus with other specified complication: Secondary | ICD-10-CM

## 2021-10-04 DIAGNOSIS — I5032 Chronic diastolic (congestive) heart failure: Secondary | ICD-10-CM

## 2021-10-04 DIAGNOSIS — I4821 Permanent atrial fibrillation: Secondary | ICD-10-CM

## 2021-10-04 DIAGNOSIS — E669 Obesity, unspecified: Secondary | ICD-10-CM

## 2021-10-04 NOTE — Patient Instructions (Signed)
Medication Instructions:  Your physician recommends that you continue on your current medications as directed. Please refer to the Current Medication list given to you today. *If you need a refill on your cardiac medications before your next appointment, please call your pharmacy*  Lab Work: None. If you have labs (blood work) drawn today and your tests are completely normal, you will receive your results only by: MyChart Message (if you have MyChart) OR A paper copy in the mail If you have any lab test that is abnormal or we need to change your treatment, we will call you to review the results.  Testing/Procedures: None.  Follow-Up: At [facility], you and your health needs are our priority.  As part of our continuing mission to provide you with exceptional heart care, we have created designated Provider Care Teams.  These Care Teams include your primary Cardiologist (physician) and Advanced Practice Providers (APPs -  Physician Assistants and Nurse Practitioners) who all work together to provide you with the care you need, when you need it.  Your physician wants you to follow-up in: As needed with Cameron Lambert, MD   We recommend signing up for the patient portal called "MyChart".  Sign up information is provided on this After Visit Summary.  MyChart is used to connect with patients for Virtual Visits (Telemedicine).  Patients are able to view lab/test results, encounter notes, upcoming appointments, etc.  Non-urgent messages can be sent to your provider as well.   To learn more about what you can do with MyChart, go to [email or web address].    Any Other Special Instructions Will Be Listed Below (If Applicable).         

## 2021-10-15 ENCOUNTER — Other Ambulatory Visit: Payer: Self-pay

## 2021-10-17 ENCOUNTER — Encounter: Payer: Self-pay | Admitting: Internal Medicine

## 2021-10-17 ENCOUNTER — Ambulatory Visit: Payer: Self-pay | Attending: Internal Medicine | Admitting: Internal Medicine

## 2021-10-17 ENCOUNTER — Other Ambulatory Visit: Payer: Self-pay

## 2021-10-17 VITALS — BP 100/66 | HR 93 | Temp 97.7°F | Resp 16 | Wt 151.8 lb

## 2021-10-17 DIAGNOSIS — E1165 Type 2 diabetes mellitus with hyperglycemia: Secondary | ICD-10-CM | POA: Insufficient documentation

## 2021-10-17 DIAGNOSIS — M545 Low back pain, unspecified: Secondary | ICD-10-CM

## 2021-10-17 DIAGNOSIS — Z7984 Long term (current) use of oral hypoglycemic drugs: Secondary | ICD-10-CM | POA: Insufficient documentation

## 2021-10-17 DIAGNOSIS — Z1231 Encounter for screening mammogram for malignant neoplasm of breast: Secondary | ICD-10-CM

## 2021-10-17 DIAGNOSIS — Z794 Long term (current) use of insulin: Secondary | ICD-10-CM | POA: Insufficient documentation

## 2021-10-17 DIAGNOSIS — G4733 Obstructive sleep apnea (adult) (pediatric): Secondary | ICD-10-CM | POA: Insufficient documentation

## 2021-10-17 DIAGNOSIS — Z23 Encounter for immunization: Secondary | ICD-10-CM

## 2021-10-17 DIAGNOSIS — M5459 Other low back pain: Secondary | ICD-10-CM | POA: Insufficient documentation

## 2021-10-17 DIAGNOSIS — Z7985 Long-term (current) use of injectable non-insulin antidiabetic drugs: Secondary | ICD-10-CM | POA: Insufficient documentation

## 2021-10-17 DIAGNOSIS — I152 Hypertension secondary to endocrine disorders: Secondary | ICD-10-CM

## 2021-10-17 DIAGNOSIS — Z79899 Other long term (current) drug therapy: Secondary | ICD-10-CM | POA: Insufficient documentation

## 2021-10-17 DIAGNOSIS — Z9989 Dependence on other enabling machines and devices: Secondary | ICD-10-CM | POA: Insufficient documentation

## 2021-10-17 DIAGNOSIS — I10 Essential (primary) hypertension: Secondary | ICD-10-CM | POA: Insufficient documentation

## 2021-10-17 DIAGNOSIS — Z7901 Long term (current) use of anticoagulants: Secondary | ICD-10-CM | POA: Insufficient documentation

## 2021-10-17 DIAGNOSIS — I4819 Other persistent atrial fibrillation: Secondary | ICD-10-CM | POA: Insufficient documentation

## 2021-10-17 DIAGNOSIS — E1159 Type 2 diabetes mellitus with other circulatory complications: Secondary | ICD-10-CM | POA: Insufficient documentation

## 2021-10-17 DIAGNOSIS — K529 Noninfective gastroenteritis and colitis, unspecified: Secondary | ICD-10-CM | POA: Insufficient documentation

## 2021-10-17 LAB — POCT GLYCOSYLATED HEMOGLOBIN (HGB A1C): Hemoglobin A1C: 14.1 % — AB (ref 4.0–5.6)

## 2021-10-17 LAB — GLUCOSE, POCT (MANUAL RESULT ENTRY): POC Glucose: 206 mg/dl — AB (ref 70–99)

## 2021-10-17 MED ORDER — TRULICITY 1.5 MG/0.5ML ~~LOC~~ SOAJ
1.5000 mg | SUBCUTANEOUS | 6 refills | Status: DC
  Filled 2021-10-17: qty 2, 28d supply, fill #0

## 2021-10-17 MED ORDER — INSULIN LISPRO (1 UNIT DIAL) 100 UNIT/ML (KWIKPEN)
PEN_INJECTOR | SUBCUTANEOUS | 6 refills | Status: DC
  Filled 2021-10-17: qty 30, fill #0

## 2021-10-17 MED ORDER — DICLOFENAC SODIUM 1 % EX GEL
2.0000 g | Freq: Four times a day (QID) | CUTANEOUS | 0 refills | Status: DC
  Filled 2021-10-17: qty 100, 13d supply, fill #0

## 2021-10-17 MED ORDER — BASAGLAR KWIKPEN 100 UNIT/ML ~~LOC~~ SOPN
40.0000 [IU] | PEN_INJECTOR | Freq: Every day | SUBCUTANEOUS | 6 refills | Status: DC
  Filled 2021-10-17 – 2021-12-26 (×2): qty 15, 37d supply, fill #0
  Filled 2022-03-27: qty 15, 37d supply, fill #1

## 2021-10-17 NOTE — Patient Instructions (Signed)
Increase Lantus insulin to 40 units daily.  Increase Humalog insulin to 12 units 3 times a day with meals. ?Stop Victoza.  We have changed it to once weekly injection with Trulicity. ?Continue to check your blood sugars before meals.  Bring your log with you when you come to see the clinical pharmacist in 3 weeks for recheck. ?

## 2021-10-17 NOTE — Progress Notes (Signed)
Patient c/o N\V\D and back pain x 4 days. Patient has not taken any medication. ?Patient said she has no other concerns today ?

## 2021-10-17 NOTE — Progress Notes (Signed)
? ? ?Patient ID: Emily Farmer, female    DOB: 1956-04-10  MRN: [id number] ? ?CC: Diabetes and Back Pain ? ? ?Subjective: ?Emily Farmer is a 66 y.o. 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 ?C/o N/V/D x 4 days.  No associated abdominal pain. Did have sweats but not sure of fever ?Reportedly strained her lower back 4 days ago when she was leaning over to vomit in the trash can.   ?V/D have stopped.  ?No numbness or tingling.  No radiation of pain.  Just stays across lower back.   ?Using Tylenol 325 mg 2 tabs Q 6 hrs which helps a little.   ? ?DM: ?Results for orders placed or performed in visit on 10/17/21  ?POCT glucose (manual entry)  ?Result Value Ref Range  ? POC Glucose 206 (A) 70 - 99 mg/dl  ?POCT glycosylated hemoglobin (Hb A1C)  ?Result Value Ref Range  ? Hemoglobin A1C 14.1 (A) 4.0 - 5.6 %  ? HbA1c POC (<> result, manual entry)    ? HbA1c, POC (prediabetic range)    ? HbA1c, POC (controlled diabetic range)    ?A1c increased from last visit when it was 10. ?Checking BS TID before meals,  no log or device with her today ?Gives range 170-200s. ?Taking Jardiance, Humalog 10 units with meals, Victozia 1.2 mg daily and Lantus 38 units daily ?Doing okay with eating habits ?Does a lot of walking to and from work which is 25 mins one way, 3 days a wk. requests note today to take to work verifying that she was here. ? ?OSA:  Not using CPAP consistently,  Makes too much nose and "my sister needs to get some sleep".  Also too much air and air leaks out of the side of the mass. ?Had the machine for about 1 yr ? ?HL:  compliant with Zetia and Lipitor ? ?A.fib/diastolic CHF/HTN: Saw cardiology in February.  Reports compliance with taking Toprol,  diltiazem and digoxin.  Her CHA2DS2-VASc score is 4.  Denies any shortness of breath or lower extremity swelling.  No bruising or bleeding on Eliquis. ?Patient Active Problem List  ? Diagnosis Date Noted  ? Bacteremia 04/01/2021  ? Atrial fibrillation with RVR ([place]) 03/31/2021  ? Pulmonary hypertension ([place]) 03/19/2021  ? Hypomagnesemia   ? Sepsis ([place]) 02/27/2021  ? Atrial fibrillation with rapid ventricular response ([place]) 02/27/2021  ? Hypotension 02/27/2021  ? Secondary hypercoagulable state ([place]) 05/08/2020  ? Type 2 diabetes mellitus with diabetic polyneuropathy, with long-term current use of insulin ([place]) 09/23/2019  ? Uncontrolled type 2 diabetes mellitus with hyperglycemia, with long-term current use of insulin ([place]) 09/23/2019  ? Dyslipidemia 09/23/2019  ? Toenail fungus 07/19/2018  ? Depression 07/19/2018  ? Iron deficiency anemia due to chronic blood loss 07/19/2018  ? Gastritis and duodenitis 07/19/2018  ? Chronic a-fib ([place]) 01/28/2018  ? Hyponatremia 01/28/2018  ? Chronic diastolic CHF (congestive heart failure) ([place]) 01/28/2018  ? Mixed hyperlipidemia 06/01/2017  ? Paroxysmal A-fib ([place]) 10/04/2016  ? Hypokalemia 01/04/2014  ? Acute pyelonephritis 02/15/2013  ? Essential hypertension 02/15/2013  ? Hypertriglyceridemia 02/15/2013  ? Diabetes mellitus type 2 in obese ([place]) 02/15/2013  ? Coronary atherosclerosis seen on CT 02/15/2013  ?  ? ?Current Outpatient Medications on File Prior to  Visit  ?Medication Sig Dispense Refill  ? acetaminophen (TYLENOL) 325 MG tablet Take 2 tablets (650 mg total) by mouth every 6 (six) hours as needed for mild pain (or Fever >/= 101).    ? apixaban (ELIQUIS) 5 MG TABS tablet Take 1 tablet (5 mg total) by mouth 2 (two) times daily. 60 tablet 5  ? atorvastatin (LIPITOR) 40 MG tablet Take 1 tablet (40 mg total) by mouth daily. 90 tablet 3  ? Blood Glucose Monitoring Suppl (TRUE METRIX METER) w/Device KIT Use to monitor blood sugar as directed (Patient taking differently: 1  each by Other route See admin instructions. Use to monitor blood sugar as directed) 1 kit 0  ? Blood Pressure Monitoring (BLOOD PRESSURE MONITOR/ARM) DEVI Check blood pressure once per day at least 2 hours after medications. 1 each 0  ? Continuous Blood Gluc Sensor (DEXCOM G6 SENSOR) MISC 1 Device by Does not apply route as directed. 3 each 11  ? Continuous Blood Gluc Transmit (DEXCOM G6 TRANSMITTER) MISC 1 Device by Does not apply route as directed. 1 each 3  ? digoxin (LANOXIN) 0.125 MG tablet Take 1 tablet (0.125 mg total) by mouth daily. 90 tablet 3  ? diltiazem (CARDIZEM CD) 120 MG 24 hr capsule Take 1 capsule (120 mg total) by mouth daily. 90 capsule 3  ? empagliflozin (JARDIANCE) 10 MG TABS tablet Take 1 tablet (10 mg total) by mouth daily before breakfast. 90 tablet 1  ? ezetimibe (ZETIA) 10 MG tablet Take 1 tablet (10 mg total) by mouth daily. 90 tablet 3  ? ferrous sulfate 325 (65 FE) MG tablet Take 1 tablet (325 mg total) by mouth daily with breakfast. 30 tablet 4  ? glucose blood test strip USE AS INSTRUCTED TO CHECK BLOOD SUGARS 3 TIMES DAILY (Patient taking differently: 1 each by Other route See admin instructions. Check blood sugar 4 times daily) 100 each 11  ? Insulin Pen Needle 32G X 4 MM MISC use as directed in the morning, at noon, in the evening, and at bedtime. 400 each 3  ? Insulin Syringe-Needle U-100 (TRUEPLUS INSULIN SYRINGE) 31G X 5/16" 0.3 ML MISC Use to inject insulin daily. (Patient taking differently: 1 each by Other route See admin instructions. Use to inject insulin daily.) 100 each 11  ? metoprolol succinate (TOPROL-XL) 100 MG 24 hr tablet Take 1 tablet (100 mg total) by mouth 2 (two) times daily. Take with or immediately following a meal. 180 tablet 3  ? Multiple Vitamins-Minerals (CENTRUM SILVER PO) Take 1 tablet by mouth daily.    ? ?No current facility-administered medications on file prior to visit.  ? ? ?No Known Allergies ? ?Social History  ? ?Socioeconomic History  ? Marital  status: Single  ?  Spouse name: Not on file  ? Number of children: Not on file  ? Years of education: Not on file  ? Highest education level: Not on file  ?Occupational History  ? Not on file  ?Tobacco Use  ? Smoking status: Never  ? Smokeless tobacco: Never  ?Vaping Use  ? Vaping Use: Never used  ?Substance and Sexual Activity  ? Alcohol use: No  ? Drug use: No  ? Sexual activity: Not Currently  ?Other Topics Concern  ? Not on file  ?Social History Narrative  ? Lives with her sister and does not need any assistance with ADLs.    ? ?Social Determinants of Health  ? ?Financial Resource Strain: Not on file  ?Food Insecurity: Not on  file  ?Transportation Needs: Not on file  ?Physical Activity: Not on file  ?Stress: Not on file  ?Social Connections: Not on file  ?Intimate Partner Violence: Not on file  ? ? ?Family History  ?Problem Relation Age of Onset  ? Heart attack Mother   ? Heart attack Father   ? Diabetes Sister   ? High blood pressure Neg Hx   ? High Cholesterol Neg Hx   ? ? ?Past Surgical History:  ?Procedure Laterality Date  ? BIOPSY  06/20/2018  ? Procedure: BIOPSY;  Surgeon: Clarene Essex, MD;  Location: [place];  Service: Endoscopy;;  ? CHOLECYSTECTOMY N/A 01/04/2014  ? Procedure: LAPAROSCOPIC CHOLECYSTECTOMY WITH INTRAOPERATIVE CHOLANGIOGRAM;  Surgeon: Ralene Ok, MD;  Location: [place];  Service: General;  Laterality: N/A;  ? COLONOSCOPY WITH PROPOFOL N/A 06/21/2018  ? Procedure: COLONOSCOPY WITH PROPOFOL;  Surgeon: Ronald Lobo, MD;  Location: [place];  Service: Endoscopy;  Laterality: N/A;  ? ESOPHAGOGASTRODUODENOSCOPY (EGD) WITH PROPOFOL N/A 06/20/2018  ? Procedure: ESOPHAGOGASTRODUODENOSCOPY (EGD) WITH PROPOFOL;  Surgeon: Clarene Essex, MD;  Location: [place];  Service: Endoscopy;  Laterality: N/A;  ? LUNG SURGERY    ? ? ?ROS: ?Review of Systems ?Negative except as stated above ? ?PHYSICAL EXAM: ?BP 100/66   Pulse 93   Temp 97.7 ?F (36.5 ?C) (Oral)   Resp 16   Wt 151 lb 12.8 oz (68.9 kg)    SpO2 96%   BMI 26.89 kg/m?   ?Wt Readings from Last 3 Encounters:  ?10/17/21 151 lb 12.8 oz (68.9 kg)  ?10/04/21 151 lb 9.6 oz (68.8 kg)  ?07/22/21 159 lb (72.1 kg)  ? ? ?Physical Exam ? ?General appearance

## 2021-11-14 ENCOUNTER — Ambulatory Visit: Payer: Self-pay | Admitting: Internal Medicine

## 2021-11-19 ENCOUNTER — Ambulatory Visit: Payer: Medicaid Other | Attending: Internal Medicine | Admitting: Pharmacist

## 2021-11-19 DIAGNOSIS — Z7984 Long term (current) use of oral hypoglycemic drugs: Secondary | ICD-10-CM | POA: Insufficient documentation

## 2021-11-19 DIAGNOSIS — Z131 Encounter for screening for diabetes mellitus: Secondary | ICD-10-CM | POA: Insufficient documentation

## 2021-11-19 DIAGNOSIS — E1165 Type 2 diabetes mellitus with hyperglycemia: Secondary | ICD-10-CM

## 2021-11-19 DIAGNOSIS — E1140 Type 2 diabetes mellitus with diabetic neuropathy, unspecified: Secondary | ICD-10-CM | POA: Insufficient documentation

## 2021-11-19 DIAGNOSIS — Z7985 Long-term (current) use of injectable non-insulin antidiabetic drugs: Secondary | ICD-10-CM | POA: Insufficient documentation

## 2021-11-19 DIAGNOSIS — Z794 Long term (current) use of insulin: Secondary | ICD-10-CM | POA: Insufficient documentation

## 2021-11-19 NOTE — Progress Notes (Signed)
S:    PCP: Dr. Laural Benes  No chief complaint on file.  Patient arrives in good spirits.  Presents for diabetes management at the request of Dr. Laural Benes. Patient was referred on 06/18/2021. Last seen by Dr. Laural Benes on 10/17/21 where A1c up from 10.1 to 14.1.  Patient reports poor adherence with medications, often forgetting nighttime doses.   Current patient reported diabetes medications doses include:  - Humalog 12 units with each meal - Jardiance 10 mg daily  - Basaglar 38 units daily (prescribed to take 40 units daily)  - Victoza 1.2 mg daily (switched to dulaglutide 1.5mg  weekly last month, but patient is still taking victoza and will switch when pen runs out. Patient estimates ~1 week left in pen.)  Patient reported dietary habits: eats sandwiches most days and tries to limit sweet intake  Patient-reported exercise habits: walking each day    Patient endorses neuropathy in fingers which is associated with days that her blood sugar is high.  Family/Social History:  - FHx: diabetes (sister); heart attack (mom, dad) - Tobacco: never smoker - Alcohol: denies using alcohol  O:  No meter with her today. Tells me that she will see the occasional 300-400 when she forgets to take insulin day before but, she feels her home CBGs are improving. Home fasting CBG: reports 88 this morning.  2 hour post-prandial/random CBG: mostly 150-200s.   Lab Results  Component Value Date   HGBA1C 14.1 (A) 10/17/2021   There were no vitals filed for this visit.  Lipid Panel     Component Value Date/Time   CHOL 132 04/21/2021 0143   CHOL 214 (H) 06/05/2020 0837   TRIG 263 (H) 04/21/2021 0143   HDL 33 (L) 04/21/2021 0143   HDL 40 06/05/2020 0837   CHOLHDL 4.0 04/21/2021 0143   VLDL 53 (H) 04/21/2021 0143   LDLCALC 46 04/21/2021 0143   LDLCALC 110 (H) 06/05/2020 0837   LDLDIRECT 137 (H) 03/01/2020 1012   LDLDIRECT 96.0 09/23/2019 0828   Clinical ASCVD: YES - CAD The 10-year ASCVD risk score  (Arnett DK, et al., 2019) is: 7.9%   Values used to calculate the score:     Age: 27 years     Sex: Female     Is Non-Hispanic African American: No     Diabetic: Yes     Tobacco smoker: No     Systolic Blood Pressure: 100 mmHg     Is BP treated: Yes     HDL Cholesterol: 33 mg/dL     Total Cholesterol: 132 mg/dL   A/P: Diabetes longstanding currently uncontrolled. Patient is able to verbalize appropriate hypoglycemia management plan. Patient poor adherence with medication.  -Increase Basaglar to 40 units daily and start taking dulaglutide once weekly when Victoza pen runs out. Continue other medications at current doses.  -Discussed injection technique, including rotating sites as patient noted scar tissue causing difficulty with injection of insulin in stomach. Confirmed understanding on proper injection technique for dulaglutide.  -Encouraged adherence medications, including nighttime insulin doses. Discussed current insulin pen can be kept out of the refrigerator and kept at bedside to help patient remember to give doses before bedtime.  -Discussed lifestyle interventions and dietary effects on glycemic control (including limiting regular bread and switching to wheat bread)  Written patient instructions provided.  Total time in face to face counseling 30 minutes.   Follow up w/ pharmacy next month.  Thank you for allowing pharmacy to participate in this patient's care.  [place]  Seward Grater, PharmD PGY1 Acute Care Resident  11/19/2021,2:14 PM

## 2021-12-02 ENCOUNTER — Other Ambulatory Visit: Payer: Self-pay

## 2021-12-03 ENCOUNTER — Other Ambulatory Visit: Payer: Self-pay

## 2021-12-09 ENCOUNTER — Other Ambulatory Visit: Payer: Self-pay

## 2021-12-10 ENCOUNTER — Encounter: Payer: Self-pay | Admitting: Internal Medicine

## 2021-12-10 ENCOUNTER — Ambulatory Visit (INDEPENDENT_AMBULATORY_CARE_PROVIDER_SITE_OTHER): Payer: Self-pay | Admitting: Internal Medicine

## 2021-12-10 ENCOUNTER — Other Ambulatory Visit: Payer: Self-pay

## 2021-12-10 VITALS — BP 126/82 | HR 90 | Ht 63.0 in | Wt 147.4 lb

## 2021-12-10 DIAGNOSIS — E1165 Type 2 diabetes mellitus with hyperglycemia: Secondary | ICD-10-CM

## 2021-12-10 DIAGNOSIS — Z794 Long term (current) use of insulin: Secondary | ICD-10-CM

## 2021-12-10 LAB — BASIC METABOLIC PANEL
BUN: 42 mg/dL — ABNORMAL HIGH (ref 6–23)
CO2: 26 mEq/L (ref 19–32)
Calcium: 10.3 mg/dL (ref 8.4–10.5)
Chloride: 98 mEq/L (ref 96–112)
Creatinine, Ser: 1.07 mg/dL (ref 0.40–1.20)
GFR: 54.82 mL/min — ABNORMAL LOW (ref >60.00)
Glucose, Bld: 153 mg/dL — ABNORMAL HIGH (ref 70–99)
Potassium: 4.4 mEq/L (ref 3.5–5.1)
Sodium: 134 mEq/L — ABNORMAL LOW (ref 135–145)

## 2021-12-10 LAB — POCT GLUCOSE (DEVICE FOR HOME USE): Glucose Fasting, POC: 166 mg/dL — AB (ref 70–99)

## 2021-12-10 MED ORDER — EMPAGLIFLOZIN 25 MG PO TABS
25.0000 mg | ORAL_TABLET | Freq: Every day | ORAL | 2 refills | Status: DC
  Filled 2021-12-10 – 2021-12-26 (×2): qty 90, 90d supply, fill #0
  Filled 2021-12-26: qty 30, 30d supply, fill #0
  Filled 2022-03-27: qty 30, 30d supply, fill #1

## 2021-12-10 NOTE — Progress Notes (Signed)
Name: Emily Farmer  Age/ Sex: 66 y.o., female   MRN/ DOB: [id number], 28-Aug-1955     PCP: Ladell Pier, MD   Reason for Endocrinology Evaluation: Type 2 Diabetes Mellitus  Initial Endocrine Consultative Visit: 09/26/2019    PATIENT IDENTIFIER: Emily Farmer is a 66 y.o. female with a past medical history of DM, HTN, A.Fib  and dyslipidemia. The patient has followed with Endocrinology clinic since 09/26/2019 for consultative assistance with management of her diabetes.  DIABETIC HISTORY:  Emily Farmer was diagnosed with DM in 2013, has been on insulin and metformin since diagnosis. Her hemoglobin A1c has ranged from 9.5% in 2020, peaking at 12.2% in 2019    On initial visit to our clinic, her A1c was 12.4% . She was on Lantus and metformin and we added Novolog    Stopped metformin 05/2021 and started [place] with sister  Assistant at a [employer]und[employer]  SUBJECTIVE:   During the last visit (05/16/2021): A1c 10.1% , We adjusted MDI regimen   Today (12/10/2021): Emily Farmer is here for a follow up on diabetes management.  She checks her blood sugars occasionally . The patient has not had hypoglycemic episodes since the last clinic visit.   Denies nausea, vomiting or diarrhea       HOME DIABETES REGIMEN:  Basaglar 38 units daily Humalog 12 unitsTIDQAC Jardiance 10 mg daily  Trulicity 1.5 mg daily     Statin: Yes ACE-I/ARB: no  METER DOWNLOAD SUMMARY:Unable to download  14 day average 411 mg/dl  89- 558         DIABETIC COMPLICATIONS: Microvascular complications:  Fluctuating GFR, cataract, neuropathy  Last eye exam: Completed 06/2020   Macrovascular complications:    Denies: CAD, PVD, CVA      HISTORY:  Past Medical History:  Past Medical History:  Diagnosis Date   Arthritis    Atrial fibrillation ([place])    a. 09/2016 in setting of pancreatitis;  b. 09/2016 Echo: EF 65-60%, no rwma, Gr2 DD, mild MR, triv TR, PASP 49mHg;  CHA2DS2VASc =  4-->Eliquis 523mBID. c. Recurred 06/2018 in setting of GIB.   Chronic diastolic CHF (congestive heart failure) [place]   Diabetes mellitus    GI bleeding 06/2018   Gout    Hyperlipidemia    Hypertension    Hypertriglyceridemia    Pancreatitis    a. 09/2016 - Triglycerides 1,392 on admission.   Past Surgical History:  Past Surgical History:  Procedure Laterality Date   BIOPSY  06/20/2018   Procedure: BIOPSY;  Surgeon: MaClarene EssexMD;  [facility];;   CHOLECYSTECTOMY N/A 01/04/2014   Procedure: LAPAROSCOPIC CHOLECYSTECTOMY WITH INTRAOPERATIVE CHOLANGIOGRAM;  Surgeon: ArRalene OkMD;  [facility]: General;  Laterality: N/A;   COLONOSCOPY WITH PROPOFOL N/A 06/21/2018   Procedure: COLONOSCOPY WITH PROPOFOL;  Surgeon: BuRonald LoboMD;  [facility]: End[facility];  Laterality: N/A;   ESOPHAGOGASTRODUODENOSCOPY (EGD) WITH PROPOFOL N/A 06/20/2018   Procedure: ESOPHAGOGASTRODUODENOSCOPY (EGD) WITH PROPOFOL;  Surgeon: MaClarene EssexMD;  Location: [facility]: Endoscopy;  Laterality: N/A;   LUNG SURGERY     Social History:  reports that she has never smoked. She has never used smokeless tobacco. She reports that she does not drink alcohol and does not use drugs. Family History:  Family History  Problem Relation Age of Onset   Heart attack Mother    Heart attack Father  Diabetes Sister    High blood pressure Neg Hx    High Cholesterol Neg Hx      HOME MEDICATIONS: Allergies as of 12/10/2021   No Known Allergies      Medication List        Accurate as of December 10, 2021  1:25 PM. If you have any questions, ask your nurse or doctor.          acetaminophen 325 MG tablet Commonly known as: TYLENOL Take 2 tablets (650 mg total) by mouth every 6 (six) hours as needed for mild pain (or Fever >/= 101).   atorvastatin 40 MG tablet Commonly known as: LIPITOR Take 1 tablet (40 mg total) by mouth  daily.   Basaglar KwikPen 100 UNIT/ML Inject 40 Units into the skin at bedtime. What changed: how much to take   Blood Pressure Monitor/Arm Devi Check blood pressure once per day at least 2 hours after medications.   CENTRUM SILVER PO Take 1 tablet by mouth daily.   Dexcom G6 Sensor Misc 1 Device by Does not apply route as directed.   Dexcom G6 Transmitter Misc 1 Device by Does not apply route as directed.   diclofenac Sodium 1 % Gel Commonly known as: Voltaren Apply 2 g topically 4 (four) times daily. Apply across lower back for back pain   digoxin 0.125 MG tablet Commonly known as: LANOXIN Take 1 tablet (0.125 mg total) by mouth daily.   diltiazem 120 MG 24 hr capsule Commonly known as: CARDIZEM CD Take 1 capsule (120 mg total) by mouth daily.   Eliquis 5 MG Tabs tablet Generic drug: apixaban Take 1 tablet (5 mg total) by mouth 2 (two) times daily.   empagliflozin 25 MG Tabs tablet Commonly known as: Jardiance Take 1 tablet (25 mg total) by mouth daily before breakfast. What changed:  medication strength how much to take Changed by: Dorita Sciara, MD   ezetimibe 10 MG tablet Commonly known as: Zetia Take 1 tablet (10 mg total) by mouth daily.   FeroSul 325 (65 FE) MG tablet Generic drug: ferrous sulfate Take 1 tablet (325 mg total) by mouth daily with breakfast.   glucose blood test strip USE AS INSTRUCTED TO CHECK BLOOD SUGARS 3 TIMES DAILY What changed:  how much to take how to take this when to take this additional instructions   insulin lispro 100 UNIT/ML KwikPen Commonly known as: HumaLOG KwikPen 12 units subcut three times a day with meals   Insulin Syringe-Needle U-100 31G X 5/16" 0.3 ML Misc Commonly known as: TRUEplus Insulin Syringe Use to inject insulin daily. What changed:  how much to take how to take this when to take this   metoprolol succinate 100 MG 24 hr tablet Commonly known as: TOPROL-XL Take 1 tablet (100 mg total)  by mouth 2 (two) times daily. Take with or immediately following a meal.   TechLite Pen Needles 32G X 4 MM Misc Generic drug: Insulin Pen Needle use as directed in the morning, at noon, in the evening, and at bedtime.   True Metrix Meter w/Device Kit Use to monitor blood sugar as directed What changed:  how much to take how to take this when to take this   Trulicity 1.5 AN/1.9TY Sopn Generic drug: Dulaglutide Inject 1.5 mg into the skin once a week.         OBJECTIVE:   Vital Signs: BP 126/82 (BP Location: Left Arm, Patient Position: Sitting, Cuff Size: Normal)   Pulse 90  Ht _0  (1.6 m)   Wt 147 lb 6.4 oz (66.9 kg)   SpO2 95%   BMI 26.11 kg/m   Wt Readings from Last 3 Encounters:  12/10/21 147 lb 6.4 oz (66.9 kg)  10/17/21 151 lb 12.8 oz (68.9 kg)  10/04/21 151 lb 9.6 oz (68.8 kg)     Exam: General: Pt appears well and is in NAD  Lungs: Clear with good BS bilat with no rales, rhonchi, or wheezes  Heart: RRR with normal S1 and S2 and no gallops; no murmurs; no rub  Extremities: No pretibial edema.   Neuro: MS is good with appropriate affect, pt is alert and Ox3   DM foot exam: 05/16/2021   The skin of the feet is intact without sores or ulcerations, but has thickened and discolored toe nails The pedal pulses are 2+ on right and 2+ on left. The sensation is decreased to a screening 5.07, 10 gram monofilament bilaterally  DATA REVIEWED:  Lab Results  Component Value Date   HGBA1C 14.1 (A) 10/17/2021   HGBA1C 10.1 (A) 05/07/2021   HGBA1C 12.8 (H) 02/27/2021    Latest Reference Range & Units 10/17/21 11:52 12/10/21 12:48  Glucose 70 - 99 mg/dL  153 (H)  Hemoglobin A1C 4.0 - 5.6 % 14.1 !      Latest Reference Range & Units 05/16/21 09:32  Creatinine,U mg/dL 83.9  Microalb, Ur 0.0 - 1.9 mg/dL 12.7 (H)  MICROALB/CREAT RATIO 0.0 - 30.0 mg/g 15.2    ASSESSMENT / PLAN / RECOMMENDATIONS:   1) Type 2 Diabetes Mellitus, Poorly controlled, With neuropathic  complications - Most recent A1c of 14.1 %. Goal A1c < 7.0 %.    -Patient continues with hyperglycemia , I suspect this is due to lack of continuous insulin intake -Today she tells me she takes Jardiance twice a day and she needs a refill? -She also tells me she has been taking insulin doses that are less than previously prescribed in December -She just saw her PCP recently, and Victoza was switched to Trulicity, and her insulin regimen has increased -I will increase Jardiance -In office BG 166 mg/DL, this is fasting  MEDICATIONS:  -Increase Jardiace 25 mg daily  -Continue Basaglar 38 units daily  -Increase Humalog 12 units with EACH meal  - Correction scale : HUmalog (BG -604/54)  -Continue Trulicity 1.5 mg weekly    EDUCATION / INSTRUCTIONS: BG monitoring instructions: Patient is instructed to check her blood sugars 3 times a day, before meals. Call [facility] if: BG persistently < 70 or > 300. I reviewed the Rule of 15 for the treatment of hypoglycemia in detail with the patient. Literature supplied.    2) Diabetic complications:  Eye: Does not have known diabetic retinopathy.  Neuro/ Feet: Does have known diabetic peripheral neuropathy. Renal: Patient does have known baseline CKD. She is not on an ACEI/ARB at present.     F/U in 4 months   Signed electronically by: Mack Guise, MD  [facility] End[facility]  [facility] [address] [place], [place] [address] Phone: [phone] FAX: [phone]   CC: Ladell Pier, MD [address] [place] [address] Phone: [phone]  Fax: [phone]  Return to Endocrinology clinic as below: Future Appointments  Date Time Provider [place]  12/26/2021 10:00 AM Tresa Endo, RPH-CPP [facility] None  01/14/2022  3:00 PM Deneise Lever, MD [facility]  02/06/2022 10:30 AM Ladell Pier,  MD [facility] None  04/17/2022 10:50 AM  Lathan Gieselman, Melanie Crazier, MD [facility] None

## 2021-12-12 ENCOUNTER — Other Ambulatory Visit: Payer: Self-pay | Admitting: Internal Medicine

## 2021-12-12 ENCOUNTER — Other Ambulatory Visit: Payer: Self-pay

## 2021-12-12 DIAGNOSIS — E1165 Type 2 diabetes mellitus with hyperglycemia: Secondary | ICD-10-CM

## 2021-12-12 DIAGNOSIS — Z794 Long term (current) use of insulin: Secondary | ICD-10-CM

## 2021-12-12 LAB — MICROALBUMIN / CREATININE URINE RATIO
Creatinine,U: 43.3 mg/dL
Microalb Creat Ratio: 19.4 mg/g (ref 0.0–30.0)
Microalb, Ur: 8.4 mg/dL — ABNORMAL HIGH (ref 0.0–1.9)

## 2021-12-12 NOTE — Addendum Note (Signed)
Addended by: Scarlette Shorts on: 12/12/2021 09:26 AM   Modules accepted: Orders

## 2021-12-13 ENCOUNTER — Encounter: Payer: Self-pay | Admitting: Internal Medicine

## 2021-12-26 ENCOUNTER — Encounter: Payer: Self-pay | Admitting: Pharmacist

## 2021-12-26 ENCOUNTER — Ambulatory Visit: Payer: Medicaid Other | Attending: Internal Medicine | Admitting: Pharmacist

## 2021-12-26 ENCOUNTER — Other Ambulatory Visit: Payer: Self-pay

## 2021-12-26 DIAGNOSIS — E1165 Type 2 diabetes mellitus with hyperglycemia: Secondary | ICD-10-CM

## 2021-12-26 DIAGNOSIS — Z794 Long term (current) use of insulin: Secondary | ICD-10-CM

## 2021-12-26 NOTE — Progress Notes (Signed)
S:    PCP: Dr. Laural Benes  No chief complaint on file.  Patient arrives in good spirits.  Presents for diabetes management at the request of Dr. Laural Benes. Patient was referred on 10/17/2021.   Patient reports poor adherence with medications, often forgetting nighttime doses.   Current patient reported diabetes medications doses include:  - Humalog 12 units with each meal - Jardiance 10 mg daily  - Basaglar 38 units daily (prescribed to take 40 units daily)  - Victoza 1.2 mg daily (switched to dulaglutide 1.5mg  weekly in May, but patient is still taking victoza and will switch when pen runs out. She plans to switch on Monday)  Patient reported dietary habits: eats sandwiches most days and tries to limit sweet intake  Patient-reported exercise habits: walking each day    Patient endorses neuropathy in fingers which is associated with days that her blood sugar is high.  Family/Social History:  - FHx: diabetes (sister); heart attack (mom, dad) - Tobacco: never smoker - Alcohol: denies using alcohol  O:  No meter with her today. Tells me that she will see the occasional 300-400 when she forgets to take insulin day before but, she feels her home CBGs are improving. Home fasting CBG: reports 88 this morning.  2 hour post-prandial/random CBG: mostly 150-200s.   Lab Results  Component Value Date   HGBA1C 14.1 (A) 10/17/2021   There were no vitals filed for this visit.  Lipid Panel     Component Value Date/Time   CHOL 132 04/21/2021 0143   CHOL 214 (H) 06/05/2020 0837   TRIG 263 (H) 04/21/2021 0143   HDL 33 (L) 04/21/2021 0143   HDL 40 06/05/2020 0837   CHOLHDL 4.0 04/21/2021 0143   VLDL 53 (H) 04/21/2021 0143   LDLCALC 46 04/21/2021 0143   LDLCALC 110 (H) 06/05/2020 0837   LDLDIRECT 137 (H) 03/01/2020 1012   LDLDIRECT 96.0 09/23/2019 0828   Clinical ASCVD: YES - CAD The 10-year ASCVD risk score (Arnett DK, et al., 2019) is: 12.2%   Values used to calculate the score:      Age: 66 years     Sex: Female     Is Non-Hispanic African American: No     Diabetic: Yes     Tobacco smoker: No     Systolic Blood Pressure: 126 mmHg     Is BP treated: Yes     HDL Cholesterol: 33 mg/dL     Total Cholesterol: 132 mg/dL   A/P: Diabetes longstanding currently uncontrolled. Patient is able to verbalize appropriate hypoglycemia management plan. Patient poor adherence with medication.  -Increase Basaglar to 40 units daily and start taking dulaglutide once weekly when Victoza pen runs out. Continue other medications at current doses.  -Discussed injection technique, including rotating sites as patient noted scar tissue causing difficulty with injection of insulin in stomach. Confirmed understanding on proper injection technique for dulaglutide.  -Encouraged adherence medications, including nighttime insulin doses. Discussed current insulin pen can be kept out of the refrigerator and kept at bedside to help patient remember to give doses before bedtime.  -Discussed lifestyle interventions and dietary effects on glycemic control (including limiting regular bread and switching to wheat bread)  Written patient instructions provided.  Total time in face to face counseling 30 minutes.   Follow up w/ PCP next month.  Thank you for allowing pharmacy to participate in this patient's care.  Butch Penny, PharmD, Patsy [place], CPP Clinical Pharmacist [facility] [phone]

## 2021-12-27 ENCOUNTER — Encounter (HOSPITAL_COMMUNITY): Payer: Self-pay

## 2021-12-27 ENCOUNTER — Other Ambulatory Visit: Payer: Self-pay

## 2021-12-27 ENCOUNTER — Emergency Department (HOSPITAL_COMMUNITY)
Admission: EM | Admit: 2021-12-27 | Discharge: 2021-12-27 | Disposition: A | Discharge status: Home / Self Care | Payer: Medicaid Other | Attending: Emergency Medicine | Admitting: Emergency Medicine

## 2021-12-27 ENCOUNTER — Emergency Department (HOSPITAL_COMMUNITY): Payer: Medicaid Other

## 2021-12-27 DIAGNOSIS — Z794 Long term (current) use of insulin: Secondary | ICD-10-CM | POA: Insufficient documentation

## 2021-12-27 DIAGNOSIS — I509 Heart failure, unspecified: Secondary | ICD-10-CM | POA: Insufficient documentation

## 2021-12-27 DIAGNOSIS — Z7901 Long term (current) use of anticoagulants: Secondary | ICD-10-CM | POA: Insufficient documentation

## 2021-12-27 DIAGNOSIS — I2510 Atherosclerotic heart disease of native coronary artery without angina pectoris: Secondary | ICD-10-CM | POA: Insufficient documentation

## 2021-12-27 DIAGNOSIS — Z79899 Other long term (current) drug therapy: Secondary | ICD-10-CM | POA: Insufficient documentation

## 2021-12-27 DIAGNOSIS — E860 Dehydration: Secondary | ICD-10-CM | POA: Insufficient documentation

## 2021-12-27 DIAGNOSIS — R079 Chest pain, unspecified: Secondary | ICD-10-CM

## 2021-12-27 DIAGNOSIS — I4891 Unspecified atrial fibrillation: Secondary | ICD-10-CM | POA: Insufficient documentation

## 2021-12-27 LAB — BASIC METABOLIC PANEL
Anion gap: 16 — ABNORMAL HIGH (ref 5–15)
BUN: 32 mg/dL — ABNORMAL HIGH (ref 8–23)
CO2: 22 mmol/L (ref 22–32)
Calcium: 9.4 mg/dL (ref 8.9–10.3)
Chloride: 97 mmol/L — ABNORMAL LOW (ref 98–111)
Creatinine, Ser: 1.37 mg/dL — ABNORMAL HIGH (ref 0.44–1.00)
GFR, Estimated: 43 mL/min — ABNORMAL LOW (ref >60)
Glucose, Bld: 331 mg/dL — ABNORMAL HIGH (ref 70–99)
Potassium: 4.1 mmol/L (ref 3.5–5.1)
Sodium: 135 mmol/L (ref 135–145)

## 2021-12-27 LAB — CBC
HCT: 40.1 % (ref 36.0–46.0)
Hemoglobin: 13.5 g/dL (ref 12.0–15.0)
MCH: 29.6 pg (ref 26.0–34.0)
MCHC: 33.7 g/dL (ref 30.0–36.0)
MCV: 87.9 fL (ref 80.0–100.0)
Platelets: 191 10*3/uL (ref 150–400)
RBC: 4.56 MIL/uL (ref 3.87–5.11)
RDW: 12.4 % (ref 11.5–15.5)
WBC: 10.1 10*3/uL (ref 4.0–10.5)
nRBC: 0 % (ref 0.0–0.2)

## 2021-12-27 LAB — TROPONIN I (HIGH SENSITIVITY)
Troponin I (High Sensitivity): 17 ng/L (ref <18)
Troponin I (High Sensitivity): 16 ng/L (ref <18)

## 2021-12-27 LAB — DIGOXIN LEVEL: Digoxin Level: 0.6 ng/mL — ABNORMAL LOW (ref 0.8–2.0)

## 2021-12-27 LAB — BRAIN NATRIURETIC PEPTIDE: B Natriuretic Peptide: 87.5 pg/mL (ref 0.0–100.0)

## 2021-12-27 MED ORDER — SODIUM CHLORIDE 0.9 % IV BOLUS
500.0000 mL | Freq: Once | INTRAVENOUS | Status: AC
  Administered 2021-12-27: 500 mL via INTRAVENOUS

## 2021-12-27 NOTE — ED Provider Notes (Signed)
[facility] EMERGENCY DEPARTMENT Provider Note   CSN: [id number] Arrival date & time: 12/27/21  1016     History  Chief Complaint  Patient presents with   Chest Pain    Emily Farmer is a 66 y.o. female.  She has a history of coronary disease CHF A-fib on anticoagulation.  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  She rates it as 9 out of 10 initially although now she states it is minimal.  Now she is just feeling the fluttering in her chest.  She said she usually does not notice the fluttering.  No headache nausea dizziness syncope.  She has been compliant with her medications.  Denies any recent illness.  The history is provided by the patient.  Chest Pain Pain location:  Substernal area Pain quality: pressure   Pain radiates to:  Neck Pain severity:  Severe Onset quality:  Sudden Duration:  2 hours Timing:  Constant Progression:  Improving Chronicity:  New Relieved by:  None tried Worsened by:  Nothing Ineffective treatments:  None tried Associated symptoms: palpitations and shortness of breath   Associated symptoms: no abdominal pain, no cough, no diaphoresis, no fever, no headache, no nausea and no vomiting   Risk factors: coronary artery disease   Risk factors: no smoking        Home Medications Prior to Admission medications   Medication Sig Start Date End Date Taking? Authorizing Provider  acetaminophen (TYLENOL) 325 MG tablet Take 2 tablets (650 mg total) by mouth every 6 (six) hours as needed for mild pain (or Fever >/= 101). 02/01/18   Elgergawy, Silver Huguenin, MD  apixaban (ELIQUIS) 5 MG TABS tablet Take 1 tablet (5 mg total) by mouth 2 (two) times daily. 07/22/21 01/18/22  Loel Dubonnet, NP  atorvastatin (LIPITOR) 40 MG tablet Take 1 tablet (40 mg total) by mouth daily. 03/21/21   Loel Dubonnet, NP  Blood Glucose Monitoring Suppl (TRUE METRIX METER) w/Device KIT Use  to monitor blood sugar as directed Patient taking differently: 1 each by Other route See admin instructions. Use to monitor blood sugar as directed 02/22/18   Fulp, Cammie, MD  Blood Pressure Monitoring (BLOOD PRESSURE MONITOR/ARM) DEVI Check blood pressure once per day at least 2 hours after medications. 07/22/21   Loel Dubonnet, NP  Continuous Blood Gluc Sensor (DEXCOM G6 SENSOR) MISC 1 Device by Does not apply route as directed. 05/16/21   Shamleffer, Melanie Crazier, MD  Continuous Blood Gluc Transmit (DEXCOM G6 TRANSMITTER) MISC 1 Device by Does not apply route as directed. 05/16/21   Shamleffer, Melanie Crazier, MD  diclofenac Sodium (VOLTAREN) 1 % GEL Apply 2 g topically 4 (four) times daily. Apply across lower back for back pain 10/17/21   Ladell Pier, MD  digoxin (LANOXIN) 0.125 MG tablet Take 1 tablet (0.125 mg total) by mouth daily. 08/05/21   Vickie Epley, MD  diltiazem (CARDIZEM CD) 120 MG 24 hr capsule Take 1 capsule (120 mg total) by mouth daily. 05/14/21   Loel Dubonnet, NP  Dulaglutide (TRULICITY) 1.5 DV/7.6HY SOPN Inject 1.5 mg into the skin once a week. 10/17/21   Ladell Pier, MD  empagliflozin (JARDIANCE) 25 MG TABS tablet Take 1 tablet (25 mg total) by mouth daily before breakfast. 12/10/21   Shamleffer, Melanie Crazier, MD  ezetimibe (ZETIA) 10 MG tablet Take 1 tablet (10 mg total) by mouth daily. 07/22/21  Loel Dubonnet, NP  ferrous sulfate 325 (65 FE) MG tablet Take 1 tablet (325 mg total) by mouth daily with breakfast. 04/11/21 04/11/22  Argentina Donovan, PA-C  glucose blood test strip USE AS INSTRUCTED TO CHECK BLOOD SUGARS 3 TIMES DAILY Patient taking differently: 1 each by Other route See admin instructions. Check blood sugar 4 times daily 03/27/20   Fulp, Cammie, MD  Insulin Glargine (BASAGLAR KWIKPEN) 100 UNIT/ML Inject 40 Units into the skin at bedtime. Patient taking differently: Inject 38 Units into the skin at bedtime. 10/17/21   Ladell Pier,  MD  insulin lispro (HUMALOG KWIKPEN) 100 UNIT/ML KwikPen 12 units subcut three times a day with meals 10/17/21   Ladell Pier, MD  Insulin Pen Needle 32G X 4 MM MISC use as directed in the morning, at noon, in the evening, and at bedtime. 05/16/21   Shamleffer, Melanie Crazier, MD  Insulin Syringe-Needle U-100 (TRUEPLUS INSULIN SYRINGE) 31G X 5/16" 0.3 ML MISC Use to inject insulin daily. Patient taking differently: 1 each by Other route See admin instructions. Use to inject insulin daily. 05/06/18   Fulp, Cammie, MD  metoprolol succinate (TOPROL-XL) 100 MG 24 hr tablet Take 1 tablet (100 mg total) by mouth 2 (two) times daily. Take with or immediately following a meal. 05/14/21   Loel Dubonnet, NP  Multiple Vitamins-Minerals (CENTRUM SILVER PO) Take 1 tablet by mouth daily.    [provider]      Allergies    Patient has no known allergies.    Review of Systems   Review of Systems  Constitutional:  Negative for diaphoresis and fever.  HENT:  Negative for sore throat.   Eyes:  Negative for visual disturbance.  Respiratory:  Positive for shortness of breath. Negative for cough.   Cardiovascular:  Positive for chest pain and palpitations.  Gastrointestinal:  Negative for abdominal pain, nausea and vomiting.  Genitourinary:  Negative for dysuria.  Skin:  Negative for rash.  Neurological:  Negative for headaches.    Physical Exam Updated Vital Signs BP 120/74   Pulse (!) 101   Temp 97.7 F (36.5 C) (Oral)   Resp 18   Ht '5\' 3"'  (1.6 m)   Wt 66.2 kg   SpO2 98%   BMI 25.86 kg/m  Physical Exam Vitals and nursing note reviewed.  Constitutional:      General: She is not in acute distress.    Appearance: She is well-developed.  HENT:     Head: Normocephalic and atraumatic.  Eyes:     Conjunctiva/sclera: Conjunctivae normal.  Cardiovascular:     Rate and Rhythm: Tachycardia present. Rhythm irregular.     Heart sounds: Normal heart sounds. No murmur  heard. Pulmonary:     Effort: Pulmonary effort is normal. No respiratory distress.     Breath sounds: Normal breath sounds.  Abdominal:     Palpations: Abdomen is soft.     Tenderness: There is no abdominal tenderness. There is no guarding or rebound.  Musculoskeletal:        General: No swelling. Normal range of motion.     Cervical back: Neck supple.     Right lower leg: No tenderness.     Left lower leg: No tenderness.  Skin:    General: Skin is warm and dry.     Capillary Refill: Capillary refill takes less than 2 seconds.  Neurological:     Mental Status: She is alert.  Psychiatric:  Mood and Affect: Mood normal.     ED Results / Procedures / Treatments   Labs (all labs ordered are listed, but only abnormal results are displayed) Labs Reviewed  BASIC METABOLIC PANEL - Abnormal; Notable for the following components:      Result Value   Chloride 97 (*)    Glucose, Bld 331 (*)    BUN 32 (*)    Creatinine, Ser 1.37 (*)    GFR, Estimated 43 (*)    Anion gap 16 (*)    All other components within normal limits  DIGOXIN LEVEL - Abnormal; Notable for the following components:   Digoxin Level 0.6 (*)    All other components within normal limits  CBC  BRAIN NATRIURETIC PEPTIDE  TROPONIN I (HIGH SENSITIVITY)  TROPONIN I (HIGH SENSITIVITY)    EKG EKG Interpretation  Date/Time:  Friday December 27 2021 10:31:58 EDT Ventricular Rate:  115 PR Interval:    QRS Duration: 81 QT Interval:  332 QTC Calculation: 422 R Axis:   19 Text Interpretation: Atrial fibrillation Ventricular premature complex Borderline repolarization abnormality No significant change since prior 12/22 Confirmed by Aletta Edouard [phone]) on 12/27/2021 10:40:03 AM  Radiology DG Chest Port 1 View  Result Date: 12/27/2021 CLINICAL DATA:  Chest pain EXAM: PORTABLE CHEST 1 VIEW COMPARISON:  04/20/2021 FINDINGS: The heart size and mediastinal contours are within normal limits. No focal airspace  consolidation, pleural effusion, or pneumothorax. The visualized skeletal structures are unremarkable. Surgical clips again seen projecting over the right chest. IMPRESSION: No active disease. Electronically Signed   By: Davina Poke D.O.   On: 12/27/2021 10:48    Procedures Procedures    Medications Ordered in ED Medications  sodium chloride 0.9 % bolus 500 mL (0 mLs Intravenous Stopped 12/27/21 1339)    ED Course/ Medical Decision Making/ A&P Clinical Course as of 12/27/21 1907  Fri Dec 27, 2021  1055 Chest x-ray interpreted by me as no acute infiltrates.  Awaiting radiology reading. [MB]  1622 And ambulated in the department with no return of chest pain or shortness of breath.  She is comfortable plan for discharge and outpatient follow-up with her treating providers.  Return instructions discussed [MB]    Clinical Course User Index [MB] Hayden Rasmussen, MD                           Medical Decision Making Amount and/or Complexity of Data Reviewed Labs: ordered. Radiology: ordered.   This patient complains of chest pain rapid heart rate shortness of breath; this involves an extensive number of treatment Options and is a complaint that carries with it a high risk of complications and morbidity. The differential includes arrhythmia, ACS, pneumonia, PE, pneumothorax, anemia, metabolic derangement  I ordered, reviewed and interpreted labs, which included CBC with normal white count normal hemoglobin, chemistries normal other than elevated blood sugar elevated BUN and creatinine reflecting some dehydration, digoxin subtherapeutic, BNP and troponins unremarkable I ordered medication IV fluids and reviewed PMP when indicated. I ordered imaging studies which included chest x-ray and I independently    visualized and interpreted imaging which showed no acute findings Additional history obtained from EMS Previous records obtained and reviewed in epic no recent admissions  Cardiac  monitoring reviewed, A-fib with RVR improving to A-fib with controlled ventricular response Social determinants considered, no significant barriers Critical Interventions: None  After the interventions stated above, I reevaluated the patient and found patient to be  asymptomatic no further chest pain or shortness of breath, ambulatory in the department without any difficulty Admission and further testing considered, no indications for admission or further work-up at this time.  Recommended continuing current medications and staying hydrated.  Follow-up with cardiology.  Return instructions discussed         Final Clinical Impression(s) / ED Diagnoses Final diagnoses:  Nonspecific chest pain  Atrial fibrillation with RVR ([place])  Dehydration    Rx / DC Orders ED Discharge Orders     None         Hayden Rasmussen, MD 12/27/21 1909

## 2021-12-27 NOTE — ED Triage Notes (Signed)
Pt BIB [facility] from work c/o centralized CP that started around 9am this morning that radiates into her neck. Pt does have a hx of A-fib. EMS gave 324 of ASA. Pt states her pain gets worse when her heart rate goes up.

## 2021-12-27 NOTE — Discharge Instructions (Addendum)
You are seen in the emergency department for chest pain and rapid heart rate shortness of breath.  Your heart rate improved with some hydration.  Your lab work did not show an obvious sign of heart attack.  Please continue your regular medications and follow-up with your cardiologist and primary care doctor.  Return to the emergency department if any worsening or concerning symptoms.

## 2022-01-06 ENCOUNTER — Other Ambulatory Visit: Payer: Self-pay

## 2022-01-07 ENCOUNTER — Other Ambulatory Visit: Payer: Self-pay

## 2022-01-14 ENCOUNTER — Ambulatory Visit (HOSPITAL_BASED_OUTPATIENT_CLINIC_OR_DEPARTMENT_OTHER): Payer: Medicaid Other | Attending: Internal Medicine | Admitting: Internal Medicine

## 2022-01-14 DIAGNOSIS — G4733 Obstructive sleep apnea (adult) (pediatric): Secondary | ICD-10-CM

## 2022-01-23 ENCOUNTER — Other Ambulatory Visit: Payer: Self-pay

## 2022-01-27 ENCOUNTER — Other Ambulatory Visit: Payer: Self-pay

## 2022-01-28 ENCOUNTER — Other Ambulatory Visit: Payer: Self-pay

## 2022-02-06 ENCOUNTER — Other Ambulatory Visit: Payer: Self-pay

## 2022-02-06 ENCOUNTER — Encounter: Payer: Self-pay | Admitting: Internal Medicine

## 2022-02-06 ENCOUNTER — Ambulatory Visit: Payer: Medicaid Other | Attending: Internal Medicine | Admitting: Internal Medicine

## 2022-02-06 VITALS — BP 120/80 | Temp 97.9°F | Ht 63.0 in | Wt 145.2 lb

## 2022-02-06 DIAGNOSIS — I4819 Other persistent atrial fibrillation: Secondary | ICD-10-CM

## 2022-02-06 DIAGNOSIS — Z794 Long term (current) use of insulin: Secondary | ICD-10-CM

## 2022-02-06 DIAGNOSIS — Z23 Encounter for immunization: Secondary | ICD-10-CM

## 2022-02-06 DIAGNOSIS — E1159 Type 2 diabetes mellitus with other circulatory complications: Secondary | ICD-10-CM

## 2022-02-06 DIAGNOSIS — E1165 Type 2 diabetes mellitus with hyperglycemia: Secondary | ICD-10-CM

## 2022-02-06 DIAGNOSIS — G4733 Obstructive sleep apnea (adult) (pediatric): Secondary | ICD-10-CM

## 2022-02-06 DIAGNOSIS — Z1231 Encounter for screening mammogram for malignant neoplasm of breast: Secondary | ICD-10-CM

## 2022-02-06 DIAGNOSIS — Z9989 Dependence on other enabling machines and devices: Secondary | ICD-10-CM

## 2022-02-06 DIAGNOSIS — N1832 Chronic kidney disease, stage 3b: Secondary | ICD-10-CM

## 2022-02-06 DIAGNOSIS — I152 Hypertension secondary to endocrine disorders: Secondary | ICD-10-CM

## 2022-02-06 LAB — POCT GLYCOSYLATED HEMOGLOBIN (HGB A1C): HbA1c, POC (controlled diabetic range): 10.7 % — AB (ref 0.0–7.0)

## 2022-02-06 LAB — GLUCOSE, POCT (MANUAL RESULT ENTRY): POC Glucose: 102 mg/dl — AB (ref 70–99)

## 2022-02-06 NOTE — Patient Instructions (Signed)
Stop Trulicity.  Increase glargine insulin to 42 units at bedtime.  Increase Humalog insulin to 14 units with meals.  Continue to monitor blood sugars.  Bring your readings in to see our clinical pharmacist Franky Macho in 3 weeks.  Please try calling again to get your mammogram scheduled.  Please remember to call the company for your CPAP machine to help with making adjustments.

## 2022-02-06 NOTE — Progress Notes (Signed)
Patient ID: Emily Farmer, female    DOB: 05/11/1956  MRN: [id number]  CC: Diabetes   Subjective: Emily Farmer is a 66 y.o. 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  However patient never started the medication until several weeks ago when she ran out of the Victoza.  Reports Trulicity because significant diarrhea for her where she had to go 3-4 times a day so she has not refilled it.  Reports compliance with Basaglar 38 units and Humalog 12 units with meals. -Saw endocrinologist [name] and our clinical pharmacist since last visit with me.  Both of asked her to not be as compliant as she should be with her medications. -A1c today decreased from previous which was over 14. -Has blood sugar log with her.  Before breakfast range is 170-250s, before lunch 208-250 and before dinner 230-389.  HTN/A-fib/diastolic CHF:   Reports compliance with taking Toprol, diltiazem and digoxin.  Her CHA2DS2-VASc score is 4.  Denies any shortness of breath or lower extremity swelling.  No bruising or bleeding on Eliquis.  She limits salt in her foods.  OSA: Referred for mask desensitization on last visit.  She states that she went but it was  determined that the mass was not the issue at all.  Told she should contact the company from which she received the machine to be told how to adjust the pressure.  She has not called them as yet but plans to.  GFR over the past year has run in the 54s.  Seen in the emergency room last month and GFR found to be 43 with creatinine of 1.37.  Patient is on Jardiance.  HM: Due for flu shot and second Shingrix vaccine.  Due for eye exam but she is uninsured and unable to afford at this time.  She tells me that she did call again about the mammogram but has not received a call back as yet.     Patient Active Problem List   Diagnosis Date Noted   Bacteremia 04/01/2021   Atrial fibrillation with RVR ([place]) 03/31/2021   Pulmonary hypertension ([place]) 03/19/2021   Hypomagnesemia    Sepsis ([place]) 02/27/2021   Atrial fibrillation with rapid ventricular response ([place]) 02/27/2021   Hypotension 02/27/2021   Secondary hypercoagulable state ([place]) 05/08/2020   Type 2 diabetes mellitus with diabetic polyneuropathy, with long-term current use of insulin ([place]) 09/23/2019   Uncontrolled type 2 diabetes mellitus with hyperglycemia, with long-term current use of insulin ([place]) 09/23/2019   Dyslipidemia 09/23/2019   Toenail fungus 07/19/2018   Depression 07/19/2018   Iron deficiency anemia due to chronic blood loss 07/19/2018   Gastritis and duodenitis 07/19/2018  Chronic a-fib ([place]) 01/28/2018   Hyponatremia 01/28/2018   Chronic diastolic CHF (congestive heart failure) ([place]) 01/28/2018   Mixed hyperlipidemia 06/01/2017   Paroxysmal A-fib ([place]) 10/04/2016   Hypokalemia 01/04/2014   Acute pyelonephritis 02/15/2013   Essential hypertension 02/15/2013   Hypertriglyceridemia 02/15/2013   Diabetes mellitus type 2 in obese ([place]) 02/15/2013   Coronary atherosclerosis seen on CT 02/15/2013     Current Outpatient Medications on File Prior to Visit  Medication Sig Dispense Refill   acetaminophen (TYLENOL) 325 MG  tablet Take 2 tablets (650 mg total) by mouth every 6 (six) hours as needed for mild pain (or Fever >/= 101).     atorvastatin (LIPITOR) 40 MG tablet Take 1 tablet (40 mg total) by mouth daily. 90 tablet 3   Blood Glucose Monitoring Suppl (TRUE METRIX METER) w/Device KIT Use to monitor blood sugar as directed (Patient taking differently: 1 each by Other route See admin instructions. Use to monitor blood sugar as directed) 1 kit 0   Blood Pressure Monitoring (BLOOD PRESSURE MONITOR/ARM) DEVI Check blood pressure once per day at least 2 hours after medications. 1 each 0   Continuous Blood Gluc Sensor (DEXCOM G6 SENSOR) MISC 1 Device by Does not apply route as directed. 3 each 11   Continuous Blood Gluc Transmit (DEXCOM G6 TRANSMITTER) MISC 1 Device by Does not apply route as directed. 1 each 3   diclofenac Sodium (VOLTAREN) 1 % GEL Apply 2 g topically 4 (four) times daily. Apply across lower back for back pain (Patient taking differently: Apply 2 g topically 4 (four) times daily as needed (back pain).) 100 g 0   digoxin (LANOXIN) 0.125 MG tablet Take 1 tablet (0.125 mg total) by mouth daily. 90 tablet 3   diltiazem (CARDIZEM CD) 120 MG 24 hr capsule Take 1 capsule (120 mg total) by mouth daily. 90 capsule 3   Dulaglutide (TRULICITY) 1.5 QM/0.8QP SOPN Inject 1.5 mg into the skin once a week. 2 mL 6   empagliflozin (JARDIANCE) 25 MG TABS tablet Take 1 tablet (25 mg total) by mouth daily before breakfast. 90 tablet 2   ferrous sulfate 325 (65 FE) MG tablet Take 1 tablet (325 mg total) by mouth daily with breakfast. 30 tablet 4   glucose blood test strip USE AS INSTRUCTED TO CHECK BLOOD SUGARS 3 TIMES DAILY (Patient taking differently: 1 each by Other route See admin instructions. Check blood sugar 4 times daily) 100 each 11   Insulin Glargine (BASAGLAR KWIKPEN) 100 UNIT/ML Inject 40 Units into the skin at bedtime. (Patient taking differently: Inject 38 Units into the skin at bedtime.) 15 mL 6   insulin  lispro (HUMALOG KWIKPEN) 100 UNIT/ML KwikPen 12 units subcut three times a day with meals 30 mL 6   Insulin Pen Needle 32G X 4 MM MISC use as directed in the morning, at noon, in the evening, and at bedtime. 400 each 3   Insulin Syringe-Needle U-100 (TRUEPLUS INSULIN SYRINGE) 31G X 5/16" 0.3 ML MISC Use to inject insulin daily. (Patient taking differently: 1 each by Other route See admin instructions. Use to inject insulin daily.) 100 each 11   metoprolol succinate (TOPROL-XL) 100 MG 24 hr tablet Take 1 tablet (100 mg total) by mouth 2 (two) times daily. Take with or immediately following a meal. (Patient taking differently: Take 100 mg by mouth daily. Take with or immediately following a meal.) 180 tablet 3   Multiple Vitamins-Minerals (CENTRUM SILVER PO) Take 1 tablet by mouth daily.  apixaban (ELIQUIS) 5 MG TABS tablet Take 1 tablet (5 mg total) by mouth 2 (two) times daily. 60 tablet 5   ezetimibe (ZETIA) 10 MG tablet Take 1 tablet (10 mg total) by mouth daily. (Patient not taking: Reported on 02/06/2022) 90 tablet 3   No current facility-administered medications on file prior to visit.    No Known Allergies  Social History   Socioeconomic History   Marital status: Single    Spouse name: Not on file   Number of children: Not on file   Years of education: Not on file   Highest education level: Not on file  Occupational History   Not on file  Tobacco Use   Smoking status: Never   Smokeless tobacco: Never  Vaping Use   Vaping Use: Never used  Substance and Sexual Activity   Alcohol use: No   Drug use: No   Sexual activity: Not Currently  Other Topics Concern   Not on file  Social History Narrative   Lives with her sister and does not need any assistance with ADLs.     Social Determinants of Health   Financial Resource Strain: Not on file  Food Insecurity: Not on file  Transportation Needs: Not on file  Physical Activity: Not on file  Stress: Not on file  Social  Connections: Not on file  Intimate Partner Violence: Not on file    Family History  Problem Relation Age of On[place]   Heart attack Mother    Heart attack Father    Diabetes Sister    High blood pressure Neg Hx    High Cholesterol Neg Hx     Past Surgical History:  Procedure Laterality Date   BIOPSY  06/20/2018   Procedure: BIOPSY;  Surgeon: Clarene Essex, MD;  Location: [facility];  Service: Endoscopy;;   CHOLECYSTECTOMY N/A 01/04/2014   Procedure: LAPAROSCOPIC CHOLECYSTECTOMY WITH INTRAOPERATIVE CHOLANGIOGRAM;  Surgeon: Ralene Ok, MD;  Location: [facility];  Service: General;  Laterality: N/A;   COLONOSCOPY WITH PROPOFOL N/A 06/21/2018   Procedure: COLONOSCOPY WITH PROPOFOL;  Surgeon: Ronald Lobo, MD;  Location: [facility];  Service: Endoscopy;  Laterality: N/A;   ESOPHAGOGASTRODUODENOSCOPY (EGD) WITH PROPOFOL N/A 06/20/2018   Procedure: ESOPHAGOGASTRODUODENOSCOPY (EGD) WITH PROPOFOL;  Surgeon: Clarene Essex, MD;  Location: [facility];  Service: Endoscopy;  Laterality: N/A;   LUNG SURGERY      ROS: Review of Systems Negative except as stated above  PHYSICAL EXAM: BP 120/80   Temp 97.9 F (36.6 C) (Oral)   Ht '5\' 3"'  (1.6 m)   Wt 145 lb 3.2 oz (65.9 kg)   SpO2 98%   BMI 25.72 kg/m   Physical Exam  General appearance - alert, well appearing, and in no distress Mental status - normal mood, behavior, speech, dress, motor activity, and thought processes Mouth - mucous membranes moist, pharynx normal without lesions Chest - clear to auscultation, no wheezes, rales or rhonchi, symmetric air entry Heart -irregularly irregular but rate controlled.  No gallops or murmurs. Extremities -no lower extremity edema.  She has noted varicose veins in both lower legs. Diabetic Foot Exam - Simple   Simple Foot Form Diabetic Foot exam was performed with the following findings: Yes 02/06/2022 11:47 AM  Visual Inspection Sensation Testing Intact to touch and monofilament testing bilaterally:  Yes Pulse Check Posterior Tibialis and Dorsalis pulse intact bilaterally: Yes Comments Toenails are thickened and discolored right greater than left.         Latest Ref Rng & Units  12/27/2021   10:36 AM 12/10/2021   12:48 PM 06/04/2021   12:17 AM  CMP  Glucose 70 - 99 mg/dL 331  153  222   BUN 8 - 23 mg/dL 32  42  39   Creatinine 0.44 - 1.00 mg/dL 1.37  1.07  1.11   Sodium 135 - 145 mmol/L 135  134  136   Potassium 3.5 - 5.1 mmol/L 4.1  4.4  4.0   Chloride 98 - 111 mmol/L 97  98  102   CO2 22 - 32 mmol/L '22  26  23   ' Calcium 8.9 - 10.3 mg/dL 9.4  10.3  9.7   Total Protein 6.5 - 8.1 g/dL   7.6   Total Bilirubin 0.3 - 1.2 mg/dL   0.9   Alkaline Phos 38 - 126 U/L   97   AST 15 - 41 U/L   32   ALT 0 - 44 U/L   25    Lipid Panel     Component Value Date/Time   CHOL 132 04/21/2021 0143   CHOL 214 (H) 06/05/2020 0837   TRIG 263 (H) 04/21/2021 0143   HDL 33 (L) 04/21/2021 0143   HDL 40 06/05/2020 0837   CHOLHDL 4.0 04/21/2021 0143   VLDL 53 (H) 04/21/2021 0143   LDLCALC 46 04/21/2021 0143   LDLCALC 110 (H) 06/05/2020 0837   LDLDIRECT 137 (H) 03/01/2020 1012   LDLDIRECT 96.0 09/23/2019 0828    CBC    Component Value Date/Time   WBC 10.1 12/27/2021 1036   RBC 4.56 12/27/2021 1036   HGB 13.5 12/27/2021 1036   HGB 12.6 04/11/2021 1447   HCT 40.1 12/27/2021 1036   HCT 39.0 04/11/2021 1447   PLT 191 12/27/2021 1036   PLT 421 04/11/2021 1447   MCV 87.9 12/27/2021 1036   MCV 87 04/11/2021 1447   MCH 29.6 12/27/2021 1036   MCHC 33.7 12/27/2021 1036   RDW 12.4 12/27/2021 1036   RDW 13.8 04/11/2021 1447   LYMPHSABS 1.1 06/04/2021 0017   LYMPHSABS 1.9 04/11/2021 1447   MONOABS 0.3 06/04/2021 0017   EOSABS 0.0 06/04/2021 0017   EOSABS 0.4 04/11/2021 1447   BASOSABS 0.0 06/04/2021 0017   BASOSABS 0.0 04/11/2021 1447    ASSESSMENT AND PLAN: 1. Type 2 diabetes mellitus with hyperglycemia, with long-term current use of insulin (HCC) Not at goal but A1c improved.  She was  not tolerant of Trulicity reporting diarrhea with the medication.  We will have her stop the Trulicity.  We will not restart Victoza. Increase glargine insulin to 42 units daily and Humalog to 14 units with meals.  Continue Jardiance.  Strongly encourage compliance with medication and eating habits. Follow-up with clinical pharmacist in 3 weeks with blood sugar readings. - POCT glucose (manual entry) - POCT glycosylated hemoglobin (Hb A1C)  2. Hypertension associated with diabetes ([place]) At goal.  Continue metoprolol XL 100 mg twice a day and diltiazem  3. Persistent atrial fibrillation (HCC) Rate control.  Continue Eliquis.  Continue metoprolol and diltiazem  4. Stage 3b chronic kidney disease ([place]) Advised to avoid NSAIDs. Advised to drink adequate fluids during the day given that she is on Jardiance. - Basic Metabolic Panel  5. OSA on CPAP Patient to call her medical supply company to be given advice on adjustment of the CPAP pressure level  6. Need for shingles vaccine Shingrix No. 2 given today.  7. Need for immunization against influenza - Flu Vaccine QUAD 86moIM (Fluarix, Fluzone &  Alfiuria Quad PF)     Patient was given the opportunity to ask questions.  Patient verbalized understanding of the plan and was able to repeat key elements of the plan.   This documentation was completed using Radio producer.  Any transcriptional errors are unintentional.  Orders Placed This Encounter  Procedures   Varicella-zoster vaccine IM   Flu Vaccine QUAD 28moIM (Fluarix, Fluzone & Alfiuria Quad PF)   Basic Metabolic Panel   POCT glucose (manual entry)   POCT glycosylated hemoglobin (Hb A1C)     Requested Prescriptions    No prescriptions requested or ordered in this encounter    Return in about 4 months (around 06/08/2022) for Appt with LErlanger Bledsoein 3 wks for BS check.  DKarle Plumber MD, FACP

## 2022-02-07 LAB — BASIC METABOLIC PANEL
BUN/Creatinine Ratio: 42 — ABNORMAL HIGH (ref 12–28)
BUN: 42 mg/dL — ABNORMAL HIGH (ref 8–27)
CO2: 21 mmol/L (ref 20–29)
Calcium: 10.5 mg/dL — ABNORMAL HIGH (ref 8.7–10.3)
Chloride: 94 mmol/L — ABNORMAL LOW (ref 96–106)
Creatinine, Ser: 1 mg/dL (ref 0.57–1.00)
Glucose: 91 mg/dL (ref 70–99)
Potassium: 4.4 mmol/L (ref 3.5–5.2)
Sodium: 139 mmol/L (ref 134–144)
eGFR: 63 mL/min/{1.73_m2} (ref >59)

## 2022-02-15 ENCOUNTER — Other Ambulatory Visit: Payer: Self-pay

## 2022-02-15 ENCOUNTER — Encounter (HOSPITAL_COMMUNITY): Payer: Self-pay

## 2022-02-15 ENCOUNTER — Emergency Department (HOSPITAL_COMMUNITY)
Admission: EM | Admit: 2022-02-15 | Discharge: 2022-02-15 | Disposition: A | Discharge status: Home / Self Care | Payer: Medicaid Other | Attending: Emergency Medicine | Admitting: Emergency Medicine

## 2022-02-15 DIAGNOSIS — Z7901 Long term (current) use of anticoagulants: Secondary | ICD-10-CM | POA: Insufficient documentation

## 2022-02-15 DIAGNOSIS — E162 Hypoglycemia, unspecified: Secondary | ICD-10-CM

## 2022-02-15 LAB — CBC WITH DIFFERENTIAL/PLATELET
Abs Immature Granulocytes: 0.04 10*3/uL (ref 0.00–0.07)
Basophils Absolute: 0 10*3/uL (ref 0.0–0.1)
Basophils Relative: 0 %
Eosinophils Absolute: 0.3 10*3/uL (ref 0.0–0.5)
Eosinophils Relative: 3 %
HCT: 40.2 % (ref 36.0–46.0)
Hemoglobin: 13.1 g/dL (ref 12.0–15.0)
Immature Granulocytes: 0 %
Lymphocytes Relative: 24 %
Lymphs Abs: 2.3 10*3/uL (ref 0.7–4.0)
MCH: 29.4 pg (ref 26.0–34.0)
MCHC: 32.6 g/dL (ref 30.0–36.0)
MCV: 90.3 fL (ref 80.0–100.0)
Monocytes Absolute: 0.8 10*3/uL (ref 0.1–1.0)
Monocytes Relative: 8 %
Neutro Abs: 6.2 10*3/uL (ref 1.7–7.7)
Neutrophils Relative %: 65 %
Platelets: 249 10*3/uL (ref 150–400)
RBC: 4.45 MIL/uL (ref 3.87–5.11)
RDW: 12.8 % (ref 11.5–15.5)
WBC: 9.7 10*3/uL (ref 4.0–10.5)
nRBC: 0 % (ref 0.0–0.2)

## 2022-02-15 LAB — CBG MONITORING, ED
Glucose-Capillary: 145 mg/dL — ABNORMAL HIGH (ref 70–99)
Glucose-Capillary: 223 mg/dL — ABNORMAL HIGH (ref 70–99)

## 2022-02-15 LAB — BASIC METABOLIC PANEL
Anion gap: 11 (ref 5–15)
BUN: 52 mg/dL — ABNORMAL HIGH (ref 8–23)
CO2: 22 mmol/L (ref 22–32)
Calcium: 9.5 mg/dL (ref 8.9–10.3)
Chloride: 104 mmol/L (ref 98–111)
Creatinine, Ser: 1.39 mg/dL — ABNORMAL HIGH (ref 0.44–1.00)
GFR, Estimated: 42 mL/min — ABNORMAL LOW (ref >60)
Glucose, Bld: 128 mg/dL — ABNORMAL HIGH (ref 70–99)
Potassium: 3.6 mmol/L (ref 3.5–5.1)
Sodium: 137 mmol/L (ref 135–145)

## 2022-02-15 NOTE — ED Triage Notes (Signed)
Recent change in diabetic medications. Woke up in the middle of the night feeling poor. Upon EMS arrival initial glucose 66. Pt was able to eat a peanut butter and jelly sandwich, cranberry juice, and pepsi. Repeat glucose 125.

## 2022-02-15 NOTE — ED Provider Notes (Signed)
[facility] EMERGENCY DEPARTMENT Provider Note   CSN: [id number] Arrival date & time: 02/15/22  0435     History  Chief Complaint  Patient presents with   Hypoglycemia    Emily Farmer is a 66 y.o. female.  66 yo F with a chief complaint of hypoglycemia.  The patient tells me that she woke up in the middle the night's and felt very sweaty and unwell.  Was found by EMS to have a blood sugar in the 60s.  She was able to bring this up with increasing her oral intake at home.  They then brought her here for evaluation.  Per the patient she just had her medications increased both her long-acting and short acting insulin.  First doses were last night.   Hypoglycemia      Home Medications Prior to Admission medications   Medication Sig Start Date End Date Taking? Authorizing Provider  acetaminophen (TYLENOL) 325 MG tablet Take 2 tablets (650 mg total) by mouth every 6 (six) hours as needed for mild pain (or Fever >/= 101). 02/01/18   Elgergawy, Silver Huguenin, MD  apixaban (ELIQUIS) 5 MG TABS tablet Take 1 tablet (5 mg total) by mouth 2 (two) times daily. 07/22/21 01/18/22  Loel Dubonnet, NP  atorvastatin (LIPITOR) 40 MG tablet Take 1 tablet (40 mg total) by mouth daily. 03/21/21   Loel Dubonnet, NP  Blood Glucose Monitoring Suppl (TRUE METRIX METER) w/Device KIT Use to monitor blood sugar as directed Patient taking differently: 1 each by Other route See admin instructions. Use to monitor blood sugar as directed 02/22/18   Fulp, Cammie, MD  Blood Pressure Monitoring (BLOOD PRESSURE MONITOR/ARM) DEVI Check blood pressure once per day at least 2 hours after medications. 07/22/21   Loel Dubonnet, NP  Continuous Blood Gluc Sensor (DEXCOM G6 SENSOR) MISC 1 Device by Does not apply route as directed. 05/16/21   Shamleffer, Melanie Crazier, MD  Continuous Blood Gluc Transmit (DEXCOM G6 TRANSMITTER) MISC 1 Device by Does not apply route as directed. 05/16/21   Shamleffer, Melanie Crazier, MD  diclofenac Sodium (VOLTAREN) 1 % GEL Apply 2 g topically 4 (four) times daily. Apply across lower back for back pain Patient taking differently: Apply 2 g topically 4 (four) times daily as needed (back pain). 10/17/21   Ladell Pier, MD  digoxin (LANOXIN) 0.125 MG tablet Take 1 tablet (0.125 mg total) by mouth daily. 08/05/21   Vickie Epley, MD  diltiazem (CARDIZEM CD) 120 MG 24 hr capsule Take 1 capsule (120 mg total) by mouth daily. 05/14/21   Loel Dubonnet, NP  Dulaglutide (TRULICITY) 1.5 KD/3.2IZ SOPN Inject 1.5 mg into the skin once a week. 10/17/21   Ladell Pier, MD  empagliflozin (JARDIANCE) 25 MG TABS tablet Take 1 tablet (25 mg total) by mouth daily before breakfast. 12/10/21   Shamleffer, Melanie Crazier, MD  ezetimibe (ZETIA) 10 MG tablet Take 1 tablet (10 mg total) by mouth daily. Patient not taking: Reported on 02/06/2022 07/22/21   Loel Dubonnet, NP  ferrous sulfate 325 (65 FE) MG tablet Take 1 tablet (325 mg total) by mouth daily with breakfast. 04/11/21 04/11/22  Argentina Donovan, PA-C  glucose blood test strip USE AS INSTRUCTED TO CHECK BLOOD SUGARS 3 TIMES DAILY Patient taking differently: 1 each by Other route See admin instructions. Check blood sugar 4 times daily 03/27/20   Fulp, Cammie, MD  Insulin Glargine (BASAGLAR KWIKPEN) 100 UNIT/ML Inject 40 Units into  the skin at bedtime. Patient taking differently: Inject 38 Units into the skin at bedtime. 10/17/21   Ladell Pier, MD  insulin lispro (HUMALOG KWIKPEN) 100 UNIT/ML KwikPen 12 units subcut three times a day with meals 10/17/21   Ladell Pier, MD  Insulin Pen Needle 32G X 4 MM MISC use as directed in the morning, at noon, in the evening, and at bedtime. 05/16/21   Shamleffer, Melanie Crazier, MD  Insulin Syringe-Needle U-100 (TRUEPLUS INSULIN SYRINGE) 31G X 5/16" 0.3 ML MISC Use to inject insulin daily. Patient taking differently: 1 each by Other route See admin instructions. Use to  inject insulin daily. 05/06/18   Fulp, Cammie, MD  metoprolol succinate (TOPROL-XL) 100 MG 24 hr tablet Take 1 tablet (100 mg total) by mouth 2 (two) times daily. Take with or immediately following a meal. Patient taking differently: Take 100 mg by mouth daily. Take with or immediately following a meal. 05/14/21   Loel Dubonnet, NP  Multiple Vitamins-Minerals (CENTRUM SILVER PO) Take 1 tablet by mouth daily.    [provider]      Allergies    Patient has no known allergies.    Review of Systems   Review of Systems  Physical Exam Updated Vital Signs BP 127/75 (BP Location: Right Arm)   Pulse (!) 57   Resp 13   Ht _0  (1.6 m)   Wt 69.9 kg   SpO2 94%   BMI 27.28 kg/m  Physical Exam Vitals and nursing note reviewed.  Constitutional:      General: She is not in acute distress.    Appearance: She is well-developed. She is not diaphoretic.  HENT:     Head: Normocephalic and atraumatic.  Eyes:     Pupils: Pupils are equal, round, and reactive to light.  Cardiovascular:     Rate and Rhythm: Normal rate and regular rhythm.     Heart sounds: No murmur heard.    No friction rub. No gallop.  Pulmonary:     Effort: Pulmonary effort is normal.     Breath sounds: No wheezing or rales.  Abdominal:     General: There is no distension.     Palpations: Abdomen is soft.     Tenderness: There is no abdominal tenderness.  Musculoskeletal:        General: No tenderness.     Cervical back: Normal range of motion and neck supple.  Skin:    General: Skin is warm and dry.  Neurological:     Mental Status: She is alert and oriented to person, place, and time.  Psychiatric:        Behavior: Behavior normal.     ED Results / Procedures / Treatments   Labs (all labs ordered are listed, but only abnormal results are displayed) Labs Reviewed  BASIC METABOLIC PANEL - Abnormal; Notable for the following components:      Result Value   Glucose, Bld 128 (*)    BUN 52 (*)     Creatinine, Ser 1.39 (*)    GFR, Estimated 42 (*)    All other components within normal limits  CBG MONITORING, ED - Abnormal; Notable for the following components:   Glucose-Capillary 145 (*)    All other components within normal limits  CBG MONITORING, ED - Abnormal; Notable for the following components:   Glucose-Capillary 223 (*)    All other components within normal limits  CBC WITH DIFFERENTIAL/PLATELET    EKG None  Radiology No  results found.  Procedures Procedures    Medications Ordered in ED Medications - No data to display  ED Course/ Medical Decision Making/ A&P                           Medical Decision Making Amount and/or Complexity of Data Reviewed Labs: ordered.   66 yo F with a chief complaints of hypoglycemia.  Improved with oral intake at home.  We will check basic blood work here.  Observe.  Recheck blood sugar.  Patient with leukocytosis no anemia, no significant electrolyte abnormality.  Repeat blood sugar 223.  Will discharge home.  PCP follow-up.  6:35 AM:  I have discussed the diagnosis/risks/treatment options with the patient.  Evaluation and diagnostic testing in the emergency department does not suggest an emergent condition requiring admission or immediate intervention beyond what has been performed at this time.  They will follow up with  PCP. We also discussed returning to the ED immediately if new or worsening sx occur. We discussed the sx which are most concerning (e.g., sudden worsening pain, fever, inability to tolerate by mouth) that necessitate immediate return. Medications administered to the patient during their visit and any new prescriptions provided to the patient are listed below.  Medications given during this visit Medications - No data to display   The patient appears reasonably screen and/or stabilized for discharge and I doubt any other medical condition or other [facility] requiring further screening, evaluation, or treatment in the  ED at this time prior to discharge.          Final Clinical Impression(s) / ED Diagnoses Final diagnoses:  Hypoglycemia    Rx / DC Orders ED Discharge Orders     None         Deno Etienne, DO 02/15/22 1572

## 2022-02-15 NOTE — Discharge Instructions (Signed)
I would go back to your initial insulin regimen.  At least until the weekend is over and you have a chance to call your doctor and get further instructions.

## 2022-03-01 ENCOUNTER — Inpatient Hospital Stay (HOSPITAL_COMMUNITY)
Admission: EM | Admit: 2022-03-01 | Discharge: 2022-03-04 | DRG: 243 | Disposition: A | Discharge status: Home / Self Care | Payer: Self-pay | Attending: Internal Medicine | Admitting: Internal Medicine

## 2022-03-01 ENCOUNTER — Other Ambulatory Visit: Payer: Self-pay

## 2022-03-01 DIAGNOSIS — M109 Gout, unspecified: Secondary | ICD-10-CM | POA: Diagnosis present

## 2022-03-01 DIAGNOSIS — I110 Hypertensive heart disease with heart failure: Secondary | ICD-10-CM | POA: Diagnosis present

## 2022-03-01 DIAGNOSIS — G4733 Obstructive sleep apnea (adult) (pediatric): Secondary | ICD-10-CM | POA: Diagnosis present

## 2022-03-01 DIAGNOSIS — I4821 Permanent atrial fibrillation: Secondary | ICD-10-CM | POA: Diagnosis present

## 2022-03-01 DIAGNOSIS — R55 Syncope and collapse: Secondary | ICD-10-CM | POA: Diagnosis present

## 2022-03-01 DIAGNOSIS — I4891 Unspecified atrial fibrillation: Secondary | ICD-10-CM

## 2022-03-01 DIAGNOSIS — Z7985 Long-term (current) use of injectable non-insulin antidiabetic drugs: Secondary | ICD-10-CM

## 2022-03-01 DIAGNOSIS — Z006 Encounter for examination for normal comparison and control in clinical research program: Secondary | ICD-10-CM

## 2022-03-01 DIAGNOSIS — Z833 Family history of diabetes mellitus: Secondary | ICD-10-CM

## 2022-03-01 DIAGNOSIS — E669 Obesity, unspecified: Secondary | ICD-10-CM | POA: Diagnosis present

## 2022-03-01 DIAGNOSIS — I10 Essential (primary) hypertension: Secondary | ICD-10-CM | POA: Diagnosis present

## 2022-03-01 DIAGNOSIS — Z794 Long term (current) use of insulin: Secondary | ICD-10-CM

## 2022-03-01 DIAGNOSIS — Z7901 Long term (current) use of anticoagulants: Secondary | ICD-10-CM

## 2022-03-01 DIAGNOSIS — K861 Other chronic pancreatitis: Secondary | ICD-10-CM | POA: Diagnosis present

## 2022-03-01 DIAGNOSIS — E1165 Type 2 diabetes mellitus with hyperglycemia: Secondary | ICD-10-CM | POA: Diagnosis present

## 2022-03-01 DIAGNOSIS — Z79899 Other long term (current) drug therapy: Secondary | ICD-10-CM

## 2022-03-01 DIAGNOSIS — I5032 Chronic diastolic (congestive) heart failure: Secondary | ICD-10-CM | POA: Diagnosis present

## 2022-03-01 DIAGNOSIS — E781 Pure hyperglyceridemia: Secondary | ICD-10-CM | POA: Diagnosis present

## 2022-03-01 DIAGNOSIS — I959 Hypotension, unspecified: Secondary | ICD-10-CM | POA: Diagnosis present

## 2022-03-01 DIAGNOSIS — R42 Dizziness and giddiness: Secondary | ICD-10-CM | POA: Diagnosis present

## 2022-03-01 DIAGNOSIS — I495 Sick sinus syndrome: Principal | ICD-10-CM | POA: Diagnosis present

## 2022-03-01 DIAGNOSIS — I4819 Other persistent atrial fibrillation: Secondary | ICD-10-CM

## 2022-03-01 DIAGNOSIS — E1169 Type 2 diabetes mellitus with other specified complication: Secondary | ICD-10-CM | POA: Diagnosis present

## 2022-03-01 DIAGNOSIS — Z8249 Family history of ischemic heart disease and other diseases of the circulatory system: Secondary | ICD-10-CM

## 2022-03-01 DIAGNOSIS — E785 Hyperlipidemia, unspecified: Secondary | ICD-10-CM | POA: Diagnosis present

## 2022-03-01 DIAGNOSIS — I081 Rheumatic disorders of both mitral and tricuspid valves: Secondary | ICD-10-CM | POA: Diagnosis present

## 2022-03-01 DIAGNOSIS — R001 Bradycardia, unspecified: Principal | ICD-10-CM

## 2022-03-01 DIAGNOSIS — N179 Acute kidney failure, unspecified: Secondary | ICD-10-CM | POA: Diagnosis present

## 2022-03-01 LAB — CBC WITH DIFFERENTIAL/PLATELET
Abs Immature Granulocytes: 0.04 10*3/uL (ref 0.00–0.07)
Basophils Absolute: 0.1 10*3/uL (ref 0.0–0.1)
Basophils Relative: 1 %
Eosinophils Absolute: 0.3 10*3/uL (ref 0.0–0.5)
Eosinophils Relative: 3 %
HCT: 35.4 % — ABNORMAL LOW (ref 36.0–46.0)
Hemoglobin: 11.3 g/dL — ABNORMAL LOW (ref 12.0–15.0)
Immature Granulocytes: 0 %
Lymphocytes Relative: 16 %
Lymphs Abs: 1.6 10*3/uL (ref 0.7–4.0)
MCH: 29.4 pg (ref 26.0–34.0)
MCHC: 31.9 g/dL (ref 30.0–36.0)
MCV: 92.2 fL (ref 80.0–100.0)
Monocytes Absolute: 0.8 10*3/uL (ref 0.1–1.0)
Monocytes Relative: 8 %
Neutro Abs: 7.1 10*3/uL (ref 1.7–7.7)
Neutrophils Relative %: 72 %
Platelets: 146 10*3/uL — ABNORMAL LOW (ref 150–400)
RBC: 3.84 MIL/uL — ABNORMAL LOW (ref 3.87–5.11)
RDW: 13.3 % (ref 11.5–15.5)
WBC: 9.9 10*3/uL (ref 4.0–10.5)
nRBC: 0 % (ref 0.0–0.2)

## 2022-03-01 LAB — COMPREHENSIVE METABOLIC PANEL
ALT: 29 U/L (ref 0–44)
AST: 44 U/L — ABNORMAL HIGH (ref 15–41)
Albumin: 3.3 g/dL — ABNORMAL LOW (ref 3.5–5.0)
Alkaline Phosphatase: 67 U/L (ref 38–126)
Anion gap: 10 (ref 5–15)
BUN: 62 mg/dL — ABNORMAL HIGH (ref 8–23)
CO2: 20 mmol/L — ABNORMAL LOW (ref 22–32)
Calcium: 9 mg/dL (ref 8.9–10.3)
Chloride: 107 mmol/L (ref 98–111)
Creatinine, Ser: 1.35 mg/dL — ABNORMAL HIGH (ref 0.44–1.00)
GFR, Estimated: 44 mL/min — ABNORMAL LOW (ref >60)
Glucose, Bld: 222 mg/dL — ABNORMAL HIGH (ref 70–99)
Potassium: 4.8 mmol/L (ref 3.5–5.1)
Sodium: 137 mmol/L (ref 135–145)
Total Bilirubin: 0.5 mg/dL (ref 0.3–1.2)
Total Protein: 5.8 g/dL — ABNORMAL LOW (ref 6.5–8.1)

## 2022-03-01 LAB — TSH: TSH: 2.521 u[IU]/mL (ref 0.350–4.500)

## 2022-03-01 LAB — MAGNESIUM: Magnesium: 2 mg/dL (ref 1.7–2.4)

## 2022-03-01 LAB — GLUCOSE, CAPILLARY: Glucose-Capillary: 200 mg/dL — ABNORMAL HIGH (ref 70–99)

## 2022-03-01 LAB — DIGOXIN LEVEL: Digoxin Level: 0.5 ng/mL — ABNORMAL LOW (ref 0.8–2.0)

## 2022-03-01 LAB — BRAIN NATRIURETIC PEPTIDE: B Natriuretic Peptide: 229.6 pg/mL — ABNORMAL HIGH (ref 0.0–100.0)

## 2022-03-01 MED ORDER — EMPAGLIFLOZIN 25 MG PO TABS
25.0000 mg | ORAL_TABLET | Freq: Every day | ORAL | Status: DC
  Administered 2022-03-02 – 2022-03-04 (×3): 25 mg via ORAL
  Filled 2022-03-01 (×3): qty 1

## 2022-03-01 MED ORDER — ADULT MULTIVITAMIN W/MINERALS CH
1.0000 | ORAL_TABLET | Freq: Every day | ORAL | Status: DC
  Administered 2022-03-01 – 2022-03-04 (×4): 1 via ORAL
  Filled 2022-03-01 (×4): qty 1

## 2022-03-01 MED ORDER — APIXABAN 5 MG PO TABS
5.0000 mg | ORAL_TABLET | Freq: Two times a day (BID) | ORAL | Status: DC
  Administered 2022-03-01 – 2022-03-02 (×2): 5 mg via ORAL
  Filled 2022-03-01 (×2): qty 1

## 2022-03-01 MED ORDER — ACETAMINOPHEN 325 MG PO TABS
650.0000 mg | ORAL_TABLET | ORAL | Status: DC | PRN
  Administered 2022-03-03 (×2): 650 mg via ORAL
  Filled 2022-03-01 (×3): qty 2

## 2022-03-01 MED ORDER — DICLOFENAC SODIUM 1 % EX GEL: 2.0000 g | Freq: Four times a day (QID) | CUTANEOUS | Status: DC | PRN

## 2022-03-01 MED ORDER — INSULIN ASPART 100 UNIT/ML IJ SOLN
0.0000 [IU] | Freq: Three times a day (TID) | INTRAMUSCULAR | Status: DC
  Administered 2022-03-02: 3 [IU] via SUBCUTANEOUS
  Administered 2022-03-02: 5 [IU] via SUBCUTANEOUS
  Administered 2022-03-03: 2 [IU] via SUBCUTANEOUS

## 2022-03-01 MED ORDER — NITROGLYCERIN 0.4 MG SL SUBL: 0.4000 mg | SUBLINGUAL_TABLET | SUBLINGUAL | Status: DC | PRN

## 2022-03-01 MED ORDER — INSULIN ASPART 100 UNIT/ML IJ SOLN: 0.0000 [IU] | Freq: Every day | INTRAMUSCULAR | Status: DC

## 2022-03-01 MED ORDER — CENTRUM SILVER PO TABS
1.0000 | ORAL_TABLET | Freq: Every day | ORAL | Status: DC
  Filled 2022-03-01: qty 1

## 2022-03-01 MED ORDER — ASPIRIN 81 MG PO TBEC
81.0000 mg | DELAYED_RELEASE_TABLET | Freq: Every day | ORAL | Status: DC
  Administered 2022-03-02 – 2022-03-04 (×2): 81 mg via ORAL
  Filled 2022-03-01 (×3): qty 1

## 2022-03-01 MED ORDER — INSULIN GLARGINE-YFGN 100 UNIT/ML ~~LOC~~ SOLN
38.0000 [IU] | Freq: Every day | SUBCUTANEOUS | Status: DC
  Administered 2022-03-01 – 2022-03-04 (×3): 38 [IU] via SUBCUTANEOUS
  Filled 2022-03-01 (×5): qty 0.38

## 2022-03-01 MED ORDER — SODIUM CHLORIDE 0.9 % IV BOLUS
500.0000 mL | Freq: Once | INTRAVENOUS | Status: AC
  Administered 2022-03-01: 500 mL via INTRAVENOUS

## 2022-03-01 MED ORDER — BASAGLAR KWIKPEN 100 UNIT/ML ~~LOC~~ SOPN
38.0000 [IU] | PEN_INJECTOR | Freq: Every day | SUBCUTANEOUS | Status: DC
  Filled 2022-03-01: qty 3

## 2022-03-01 MED ORDER — ONDANSETRON HCL 4 MG/2ML IJ SOLN: 4.0000 mg | Freq: Four times a day (QID) | INTRAMUSCULAR | Status: DC | PRN

## 2022-03-01 MED ORDER — FERROUS SULFATE 325 (65 FE) MG PO TABS
325.0000 mg | ORAL_TABLET | Freq: Every day | ORAL | Status: DC
  Administered 2022-03-02 – 2022-03-04 (×3): 325 mg via ORAL
  Filled 2022-03-01 (×3): qty 1

## 2022-03-01 MED ORDER — ATORVASTATIN CALCIUM 40 MG PO TABS
40.0000 mg | ORAL_TABLET | Freq: Every day | ORAL | Status: DC
  Administered 2022-03-01 – 2022-03-04 (×4): 40 mg via ORAL
  Filled 2022-03-01 (×4): qty 1

## 2022-03-01 NOTE — ED Provider Notes (Signed)
[facility] EMERGENCY DEPARTMENT Provider Note   CSN: [id number] Arrival date & time: 03/01/22  0745     History  No chief complaint on file.   Emily Farmer is a 66 y.o. female.  The history is provided by the patient and medical records. No language interpreter was used.  Near Syncope This is a new problem. The current episode started 12 to 24 hours ago. The problem occurs constantly. The problem has not changed since onset.Pertinent negatives include no chest pain, no abdominal pain, no headaches and no shortness of breath. Nothing aggravates the symptoms. Nothing relieves the symptoms. She has tried nothing for the symptoms. The treatment provided no relief.       Home Medications Prior to Admission medications   Medication Sig Start Date End Date Taking? Authorizing Provider  acetaminophen (TYLENOL) 325 MG tablet Take 2 tablets (650 mg total) by mouth every 6 (six) hours as needed for mild pain (or Fever >/= 101). 02/01/18   Elgergawy, Silver Huguenin, MD  apixaban (ELIQUIS) 5 MG TABS tablet Take 1 tablet (5 mg total) by mouth 2 (two) times daily. 07/22/21 01/18/22  Loel Dubonnet, NP  atorvastatin (LIPITOR) 40 MG tablet Take 1 tablet (40 mg total) by mouth daily. 03/21/21   Loel Dubonnet, NP  Blood Glucose Monitoring Suppl (TRUE METRIX METER) w/Device KIT Use to monitor blood sugar as directed Patient taking differently: 1 each by Other route See admin instructions. Use to monitor blood sugar as directed 02/22/18   Fulp, Cammie, MD  Blood Pressure Monitoring (BLOOD PRESSURE MONITOR/ARM) DEVI Check blood pressure once per day at least 2 hours after medications. 07/22/21   Loel Dubonnet, NP  Continuous Blood Gluc Sensor (DEXCOM G6 SENSOR) MISC 1 Device by Does not apply route as directed. 05/16/21   Shamleffer, Melanie Crazier, MD  Continuous Blood Gluc Transmit (DEXCOM G6 TRANSMITTER) MISC 1 Device by Does not apply route as directed. 05/16/21   Shamleffer, Melanie Crazier, MD  diclofenac Sodium (VOLTAREN) 1 % GEL Apply 2 g topically 4 (four) times daily. Apply across lower back for back pain Patient taking differently: Apply 2 g topically 4 (four) times daily as needed (back pain). 10/17/21   Ladell Pier, MD  digoxin (LANOXIN) 0.125 MG tablet Take 1 tablet (0.125 mg total) by mouth daily. 08/05/21   Vickie Epley, MD  diltiazem (CARDIZEM CD) 120 MG 24 hr capsule Take 1 capsule (120 mg total) by mouth daily. 05/14/21   Loel Dubonnet, NP  Dulaglutide (TRULICITY) 1.5 KT/6.2BW SOPN Inject 1.5 mg into the skin once a week. 10/17/21   Ladell Pier, MD  empagliflozin (JARDIANCE) 25 MG TABS tablet Take 1 tablet (25 mg total) by mouth daily before breakfast. 12/10/21   Shamleffer, Melanie Crazier, MD  ezetimibe (ZETIA) 10 MG tablet Take 1 tablet (10 mg total) by mouth daily. Patient not taking: Reported on 02/06/2022 07/22/21   Loel Dubonnet, NP  ferrous sulfate 325 (65 FE) MG tablet Take 1 tablet (325 mg total) by mouth daily with breakfast. 04/11/21 04/11/22  Argentina Donovan, PA-C  glucose blood test strip USE AS INSTRUCTED TO CHECK BLOOD SUGARS 3 TIMES DAILY Patient taking differently: 1 each by Other route See admin instructions. Check blood sugar 4 times daily 03/27/20   Fulp, Cammie, MD  Insulin Glargine (BASAGLAR KWIKPEN) 100 UNIT/ML Inject 40 Units into the skin at bedtime. Patient taking differently: Inject 38 Units into the skin at bedtime. 10/17/21  Ladell Pier, MD  insulin lispro (HUMALOG KWIKPEN) 100 UNIT/ML KwikPen 12 units subcut three times a day with meals 10/17/21   Ladell Pier, MD  Insulin Pen Needle 32G X 4 MM MISC use as directed in the morning, at noon, in the evening, and at bedtime. 05/16/21   Shamleffer, Melanie Crazier, MD  Insulin Syringe-Needle U-100 (TRUEPLUS INSULIN SYRINGE) 31G X 5/16" 0.3 ML MISC Use to inject insulin daily. Patient taking differently: 1 each by Other route See admin instructions. Use to  inject insulin daily. 05/06/18   Fulp, Cammie, MD  metoprolol succinate (TOPROL-XL) 100 MG 24 hr tablet Take 1 tablet (100 mg total) by mouth 2 (two) times daily. Take with or immediately following a meal. Patient taking differently: Take 100 mg by mouth daily. Take with or immediately following a meal. 05/14/21   Loel Dubonnet, NP  Multiple Vitamins-Minerals (CENTRUM SILVER PO) Take 1 tablet by mouth daily.    [provider]      Allergies    Patient has no known allergies.    Review of Systems   Review of Systems  Constitutional:  Negative for chills, fatigue and fever.  HENT:  Negative for congestion.   Respiratory:  Negative for cough, chest tightness and shortness of breath.   Cardiovascular:  Positive for near-syncope. Negative for chest pain.  Gastrointestinal:  Negative for abdominal pain, constipation, diarrhea, nausea and vomiting.  Genitourinary:  Negative for dysuria and flank pain.  Musculoskeletal:  Negative for back pain, neck pain and neck stiffness.  Skin:  Negative for rash and wound.  Neurological:  Positive for light-headedness. Negative for dizziness, weakness, numbness and headaches.  Psychiatric/Behavioral:  Negative for agitation and confusion.   All other systems reviewed and are negative.   Physical Exam Updated Vital Signs BP (!) 82/52   Pulse (!) 46   Temp 97.7 F (36.5 C) (Oral)   Resp 15   SpO2 94%  Physical Exam Vitals and nursing note reviewed.  Constitutional:      General: She is not in acute distress.    Appearance: She is well-developed. She is not ill-appearing, toxic-appearing or diaphoretic.  HENT:     Head: Normocephalic and atraumatic.     Nose: No congestion or rhinorrhea.     Mouth/Throat:     Mouth: Mucous membranes are dry.     Pharynx: No oropharyngeal exudate or posterior oropharyngeal erythema.  Eyes:     Extraocular Movements: Extraocular movements intact.     Conjunctiva/sclera: Conjunctivae normal.      Pupils: Pupils are equal, round, and reactive to light.  Cardiovascular:     Rate and Rhythm: Bradycardia present. Rhythm irregular.     Heart sounds: No murmur heard. Pulmonary:     Effort: Pulmonary effort is normal. No respiratory distress.     Breath sounds: Normal breath sounds. No wheezing or rhonchi.  Chest:     Chest wall: No tenderness.  Abdominal:     Palpations: Abdomen is soft.     Tenderness: There is no abdominal tenderness. There is no guarding or rebound.  Musculoskeletal:        General: No swelling or tenderness.     Cervical back: Neck supple. No tenderness.  Skin:    General: Skin is warm and dry.     Capillary Refill: Capillary refill takes less than 2 seconds.     Findings: No erythema or rash.  Neurological:     General: No focal deficit present.  Mental Status: She is alert.     Sensory: No sensory deficit.     Motor: No weakness.  Psychiatric:        Mood and Affect: Mood normal.     ED Results / Procedures / Treatments   Labs (all labs ordered are listed, but only abnormal results are displayed) Labs Reviewed  CBC WITH DIFFERENTIAL/PLATELET - Abnormal; Notable for the following components:      Result Value   RBC 3.84 (*)    Hemoglobin 11.3 (*)    HCT 35.4 (*)    Platelets 146 (*)    All other components within normal limits  COMPREHENSIVE METABOLIC PANEL - Abnormal; Notable for the following components:   CO2 20 (*)    Glucose, Bld 222 (*)    BUN 62 (*)    Creatinine, Ser 1.35 (*)    Total Protein 5.8 (*)    Albumin 3.3 (*)    AST 44 (*)    GFR, Estimated 44 (*)    All other components within normal limits  MAGNESIUM  TSH  TSH  BRAIN NATRIURETIC PEPTIDE  DIGOXIN LEVEL    EKG EKG Interpretation  Date/Time:  Saturday March 01 2022 07:54:04 EDT Ventricular Rate:  69 PR Interval:    QRS Duration: 73 QT Interval:  498 QTC Calculation: 472 R Axis:   72 Text Interpretation: Atrial fibrillation Low voltage, precordial leads  Anteroseptal infarct, old Nonspecific T abnormalities, lateral leads when compared to prior, still afib with slow rate. No STEMI Confirmed by Antony Blackbird [phone]) on 03/01/2022 8:06:19 AM  Radiology No results found.  Procedures Procedures    Medications Ordered in ED Medications  sodium chloride 0.9 % bolus 500 mL (0 mLs Intravenous Stopped 03/01/22 0829)    ED Course/ Medical Decision Making/ A&P                           Medical Decision Making Amount and/or Complexity of Data Reviewed Labs: ordered.    Oluwatoni Rotunno is a 66 y.o. female with a past medical history significant for hypertension, diabetes, paroxysmal atrial fibrillation on Eliquis therapy, CHF, gout, and hypertriglyceridemia who presents with lightheadedness and near syncope.  According to patient, she has felt fatigued for the last week but is denying any chest pain or shortness of breath.  She said that while walking to work this morning she was very unsteady and felt she is going to pass out.  EMS reportedly found her with blood pressure in the 80s and her heart rate has been in the 20s to 40s and rate.  Patient says she did not take her beta-blocker this morning as she normally takes it later on in the day.  She reports no nausea, vomiting, constipation, diarrhea, or urinary changes.  Denies fevers, chills, congestion, or cough.  Denies any other physical complaints and denies any palpitations.  She said she has never had a rate this low in the past.  She still just feels fatigued and lightheaded.  She does report some tingling in her extremities for the last week or 2.  On exam, lungs clear and chest nontender.  Abdomen nontender.  Patient has no significant edema in the legs.  Good pulses in extremities.  She did not have a murmur on my auscultation and patient otherwise resting comfortably without acute distress with the bradycardia and fatigue.  Clinically I am concerned that the patient's A-fib has now gone into  slow ventricular  response and she is symptomatically bradycardic with a near syncope today.  We will get screening labs including electrolytes and TSH.  Due to her digoxin use we will get a digoxin level.  I spoke with Dr. Debara Pickett with cardiology who will send his team down to see the patient and they will help guide a management plan.  She did not take her beta-blocker this morning and we will have her hold other medications at this time.  Await cardiology recommendations for management but anticipate admission.  We will give a small amount of fluids for the persistent hypotension although patient is otherwise asymptomatic aside from fatigue.  Cardiology is coming to the patient by anticipate admission for further management.         Final Clinical Impression(s) / ED Diagnoses Final diagnoses:  Symptomatic bradycardia  Atrial fibrillation with slow ventricular response (HCC)  Hypotension, unspecified hypotension type  Near syncope     Clinical Impression: 1. Symptomatic bradycardia   2. Atrial fibrillation with slow ventricular response ([place])   3. Hypotension, unspecified hypotension type   4. Near syncope     Disposition: Admit  This note was prepared with assistance of Systems analyst. Occasional wrong-word or sound-a-like substitutions may have occurred due to the inherent limitations of voice recognition software.      Havyn Ramo, Gwenyth Allegra, MD 03/01/22 1104

## 2022-03-01 NOTE — ED Notes (Signed)
ED TO INPATIENT HANDOFF REPORT    S Name/Age/Gender Emily Farmer 66 y.o. female Room/Bed: 034C/034C  Code Status   Code Status: Prior  Home/SNF/Other Home Patient oriented to: self, place, time, and situation Is this baseline? Yes   Triage Complete: Triage complete  Chief Complaint Atrial fibrillation with slow ventricular response ([place]) [I48.91]  Triage Note Pt arriving from work via [employer]. Pt walked to work and felt dizzy when she arrived, her gait was unsteady. Co-workers noticed that she was unsteady and pt stated she was experiencing chest pain. Co-workers noticed she was slow to respond as well. Upon EMS arrival her stroke screen was 0. Pt blood pressure was 88/46, and  HR showed she was in A-Fib ranging from 20-46 BPM.  Pt has hx of A-Fib and takes beta blockers at home. 18g was administered by EMS and pt received 761ml of NS.  AAOx4  LVS  88/44 HR 48  Sp02: 98  no nausea or dizziness reported  18g LFA  Ao4    Allergies No Known Allergies  Level of Care/Admitting Diagnosis ED Disposition     ED Disposition  Admit   Condition  --   Comment  Hospital Area: [facility] [[id number]]  Level of Care: Progressive [102]  Admit to Progressive based on following criteria: CARDIOVASCULAR & THORACIC of moderate stability with acute coronary syndrome symptoms/low risk myocardial infarction/hypertensive urgency/arrhythmias/heart failure potentially compromising stability and stable post cardiovascular intervention patients.  May admit patient to [facility] or [facility] if equivalent level of care is available:: No  Covid Evaluation: Asymptomatic - no recent exposure (last 10 days) testing not required  Diagnosis: Atrial fibrillation with slow ventricular response [facility]) [[id number]]  Admitting Physician: Pixie Casino [phone]  Attending Physician: Pixie Casino [[id number]]  Certification:: I certify this patient will need inpatient services for at least 2  midnights  Estimated Length of Stay: 4          B Medical/Surgery History Past Medical History:  Diagnosis Date   Arthritis    Atrial fibrillation ([place])    a. 09/2016 in setting of pancreatitis;  b. 09/2016 Echo: EF 65-60%, no rwma, Gr2 DD, mild MR, triv TR, PASP 47mmHg;  CHA2DS2VASc = 4-->Eliquis 5mg  BID. c. Recurred 06/2018 in setting of GIB.   Chronic diastolic CHF (congestive heart failure) ([place]) 01/28/2018   Diabetes mellitus    GI bleeding 06/2018   Gout    Hyperlipidemia    Hypertension    Hypertriglyceridemia    Pancreatitis    a. 09/2016 - Triglycerides 1,392 on admission.   Past Surgical History:  Procedure Laterality Date   BIOPSY  06/20/2018   Procedure: BIOPSY;  Surgeon: Clarene Essex, MD;  Location: [facility];  Service: Endoscopy;;   CHOLECYSTECTOMY N/A 01/04/2014   Procedure: LAPAROSCOPIC CHOLECYSTECTOMY WITH INTRAOPERATIVE CHOLANGIOGRAM;  Surgeon: Ralene Ok, MD;  Location: [facility];  Service: General;  Laterality: N/A;   COLONOSCOPY WITH PROPOFOL N/A 06/21/2018   Procedure: COLONOSCOPY WITH PROPOFOL;  Surgeon: Ronald Lobo, MD;  Location: [facility];  Service: Endoscopy;  Laterality: N/A;   ESOPHAGOGASTRODUODENOSCOPY (EGD) WITH PROPOFOL N/A 06/20/2018   Procedure: ESOPHAGOGASTRODUODENOSCOPY (EGD) WITH PROPOFOL;  Surgeon: Clarene Essex, MD;  Location: [facility];  Service: Endoscopy;  Laterality: N/A;   LUNG SURGERY       A IV Location/Drains/Wounds Patient Lines/Drains/Airways Status     Active Line/Drains/Airways     Name Placement date Placement time Site Days   Peripheral IV 03/01/22 18 G Anterior;Left Forearm  03/01/22  0750  Forearm  less than 1            Intake/Output Last 24 hours No intake or output data in the 24 hours ending 03/01/22 1328  Labs/Imaging Results for orders placed or performed during the hospital encounter of 03/01/22 (from the past 48 hour(s))  CBC with Differential     Status: Abnormal   Collection Time: 03/01/22  9:00  AM  Result Value Ref Range   WBC 9.9 4.0 - 10.5 K/uL   RBC 3.84 (L) 3.87 - 5.11 MIL/uL   Hemoglobin 11.3 (L) 12.0 - 15.0 g/dL   HCT 24.0 (L) 97.3 - 53.2 %   MCV 92.2 80.0 - 100.0 fL   MCH 29.4 26.0 - 34.0 pg   MCHC 31.9 30.0 - 36.0 g/dL   RDW 99.2 42.6 - 83.4 %   Platelets 146 (L) 150 - 400 K/uL   nRBC 0.0 0.0 - 0.2 %   Neutrophils Relative % 72 %   Neutro Abs 7.1 1.7 - 7.7 K/uL   Lymphocytes Relative 16 %   Lymphs Abs 1.6 0.7 - 4.0 K/uL   Monocytes Relative 8 %   Monocytes Absolute 0.8 0.1 - 1.0 K/uL   Eosinophils Relative 3 %   Eosinophils Absolute 0.3 0.0 - 0.5 K/uL   Basophils Relative 1 %   Basophils Absolute 0.1 0.0 - 0.1 K/uL   Immature Granulocytes 0 %   Abs Immature Granulocytes 0.04 0.00 - 0.07 K/uL    Comment: Performed at [facility], [address], [place], [place] [address]  Comprehensive metabolic panel     Status: Abnormal   Collection Time: 03/01/22  9:00 AM  Result Value Ref Range   Sodium 137 135 - 145 mmol/L   Potassium 4.8 3.5 - 5.1 mmol/L   Chloride 107 98 - 111 mmol/L   CO2 20 (L) 22 - 32 mmol/L   Glucose, Bld 222 (H) 70 - 99 mg/dL    Comment: Glucose reference range applies only to samples taken after fasting for at least 8 hours.   BUN 62 (H) 8 - 23 mg/dL   Creatinine, Ser 2.97 (H) 0.44 - 1.00 mg/dL   Calcium 9.0 8.9 - 98.9 mg/dL   Total Protein 5.8 (L) 6.5 - 8.1 g/dL   Albumin 3.3 (L) 3.5 - 5.0 g/dL   AST 44 (H) 15 - 41 U/L   ALT 29 0 - 44 U/L   Alkaline Phosphatase 67 38 - 126 U/L   Total Bilirubin 0.5 0.3 - 1.2 mg/dL   GFR, Estimated 44 (L) >60 mL/min    Comment: (NOTE) Calculated using the CKD-EPI Creatinine Equation (2021)    Anion gap 10 5 - 15    Comment: Performed at [facility], [address], [place], [place] [address]  Magnesium     Status: None   Collection Time: 03/01/22  9:00 AM  Result Value Ref Range   Magnesium 2.0 1.7 - 2.4 mg/dL    Comment: Performed at [facility], [address]  [place], [place] [address]  Digoxin level     Status: Abnormal   Collection Time: 03/01/22  9:00 AM  Result Value Ref Range   Digoxin Level 0.5 (L) 0.8 - 2.0 ng/mL    Comment: Performed at [facility], [address], [place], [place] [address]  TSH     Status: None   Collection Time: 03/01/22  9:00 AM  Result Value Ref  Range   TSH 2.521 0.350 - 4.500 uIU/mL    Comment: Performed by a 3rd Generation assay with a functional sensitivity of <=0.01 uIU/mL. Performed at [facility], [address], [place], [place] [address]    No results found.  Pending Labs Unresulted Labs (From admission, onward)     Start     Ordered   03/01/22 0818  TSH  Once,   URGENT        03/01/22 0817   03/01/22 0818  Brain natriuretic peptide  Once,   URGENT        03/01/22 0817   Signed and Held  HIV Antibody (routine testing w rflx)  (HIV Antibody (Routine testing w reflex) panel)  Once,   R        Signed and Held            Vitals/Pain Today's Vitals   03/01/22 1130 03/01/22 1230 03/01/22 1300 03/01/22 1327  BP: 131/74 122/68 131/69   Pulse: 63 71 76   Resp: 14 16 14    Temp:    98.2 F (36.8 C)  TempSrc:      SpO2: (!) 88% 94% 93%   PainSc:        Isolation Precautions No active isolations  Medications Medications  sodium chloride 0.9 % bolus 500 mL (0 mLs Intravenous Stopped 03/01/22 0829)    Mobility walks Low fall risk     R Recommendations: See Admitting Provider Note

## 2022-03-01 NOTE — ED Notes (Signed)
Alerted RN to monitor alerts, discussed same.

## 2022-03-01 NOTE — ED Triage Notes (Signed)
Pt arriving from work via [employer]. Pt walked to work and felt dizzy when she arrived, her gait was unsteady. Co-workers noticed that she was unsteady and pt stated she was experiencing chest pain. Co-workers noticed she was slow to respond as well. Upon EMS arrival her stroke screen was 0. Pt blood pressure was 88/46, and  HR showed she was in A-Fib ranging from 20-46 BPM.  Pt has hx of A-Fib and takes beta blockers at home. 18g was administered by EMS and pt received 770ml of NS.  AAOx4  LVS  88/44 HR 48  Sp02: 98  no nausea or dizziness reported  18g LFA  Ao4

## 2022-03-01 NOTE — H&P (Addendum)
Cardiology Admission History and Physical   Patient ID: Emily Farmer MRN: [id number]; DOB: 11-09-1955   Admission date: 03/01/2022  PCP:  Ladell Pier, MD   [facility] Cardiologist:  Pixie Casino, MD  Electrophysiologist:  Vickie Epley, MD       Chief Complaint: Dizziness  Patient Profile:   Emily Farmer is a 66 y.o. female with permanent atrial fibrillation, HFpEF, mitral regurgitation, tricuspid regurgitation, diabetes mellitus, hypertriglyceridemia, history of pancreatitis, hypertension, anemia, sleep apnea who is being seen 03/01/2022 for the evaluation of symptomatic bradycardia.  History of Present Illness:   Emily Farmer has a history of paroxysmal atrial fibrillation.  She had previously been on amiodarone.  She had significant bradycardia while in sinus rhythm and this was discontinued.  She has since developed recurrent atrial fibrillation and has been managed with a rate control strategy with metoprolol succinate, diltiazem and digoxin.  She has been seen by EP as well as the atrial fibrillation clinic.  Limited echocardiogram in January 2023 demonstrated EF 55-60, moderate LAE, mild MR, moderate TR.  She presented to the emergency room today with complaints of dizziness and near syncope.  She was transported via EMS.  EMS noted heart rates in the 20s to 40s with blood pressure 88/46.  Electrocardiogram was personally reviewed and interpreted and demonstrated atrial fibrillation with heart rate 69.  Telemetry demonstrates heart rates dipping into the 20s and 30s at times.   Past Medical History:  Diagnosis Date   Arthritis    Atrial fibrillation ([place])    a. 09/2016 in setting of pancreatitis;  b. 09/2016 Echo: EF 65-60%, no rwma, Gr2 DD, mild MR, triv TR, PASP 28mmHg;  CHA2DS2VASc = 4-->Eliquis $RemoveBefor'5mg'wNIZkjzwkryM$  BID. c. Recurred 06/2018 in setting of GIB.   Chronic diastolic CHF (congestive heart failure) ([place]) 01/28/2018   Diabetes mellitus    GI  bleeding 06/2018   Gout    Hyperlipidemia    Hypertension    Hypertriglyceridemia    Pancreatitis    a. 09/2016 - Triglycerides 1,392 on admission.    Past Surgical History:  Procedure Laterality Date   BIOPSY  06/20/2018   Procedure: BIOPSY;  Surgeon: Clarene Essex, MD;  Location: [facility];  Service: Endoscopy;;   CHOLECYSTECTOMY N/A 01/04/2014   Procedure: LAPAROSCOPIC CHOLECYSTECTOMY WITH INTRAOPERATIVE CHOLANGIOGRAM;  Surgeon: Ralene Ok, MD;  Location: [facility];  Service: General;  Laterality: N/A;   COLONOSCOPY WITH PROPOFOL N/A 06/21/2018   Procedure: COLONOSCOPY WITH PROPOFOL;  Surgeon: Ronald Lobo, MD;  Location: [facility];  Service: Endoscopy;  Laterality: N/A;   ESOPHAGOGASTRODUODENOSCOPY (EGD) WITH PROPOFOL N/A 06/20/2018   Procedure: ESOPHAGOGASTRODUODENOSCOPY (EGD) WITH PROPOFOL;  Surgeon: Clarene Essex, MD;  Location: [facility];  Service: Endoscopy;  Laterality: N/A;   LUNG SURGERY       Medications Prior to Admission: Prior to Admission medications   Medication Sig Start Date End Date Taking? Authorizing Provider  acetaminophen (TYLENOL) 325 MG tablet Take 2 tablets (650 mg total) by mouth every 6 (six) hours as needed for mild pain (or Fever >/= 101). 02/01/18   Elgergawy, Silver Huguenin, MD  apixaban (ELIQUIS) 5 MG TABS tablet Take 1 tablet (5 mg total) by mouth 2 (two) times daily. 07/22/21 01/18/22  Loel Dubonnet, NP  atorvastatin (LIPITOR) 40 MG tablet Take 1 tablet (40 mg total) by mouth daily. 03/21/21   Loel Dubonnet, NP  Blood Glucose Monitoring Suppl (TRUE METRIX METER) w/Device KIT Use to monitor blood sugar as directed Patient  taking differently: 1 each by Other route See admin instructions. Use to monitor blood sugar as directed 02/22/18   Fulp, Cammie, MD  Blood Pressure Monitoring (BLOOD PRESSURE MONITOR/ARM) DEVI Check blood pressure once per day at least 2 hours after medications. 07/22/21   Loel Dubonnet, NP  Continuous Blood Gluc Sensor (DEXCOM G6  SENSOR) MISC 1 Device by Does not apply route as directed. 05/16/21   Shamleffer, Melanie Crazier, MD  Continuous Blood Gluc Transmit (DEXCOM G6 TRANSMITTER) MISC 1 Device by Does not apply route as directed. 05/16/21   Shamleffer, Melanie Crazier, MD  diclofenac Sodium (VOLTAREN) 1 % GEL Apply 2 g topically 4 (four) times daily. Apply across lower back for back pain Patient taking differently: Apply 2 g topically 4 (four) times daily as needed (back pain). 10/17/21   Ladell Pier, MD  digoxin (LANOXIN) 0.125 MG tablet Take 1 tablet (0.125 mg total) by mouth daily. 08/05/21   Vickie Epley, MD  diltiazem (CARDIZEM CD) 120 MG 24 hr capsule Take 1 capsule (120 mg total) by mouth daily. 05/14/21   Loel Dubonnet, NP  Dulaglutide (TRULICITY) 1.5 QA/8.3MH SOPN Inject 1.5 mg into the skin once a week. 10/17/21   Ladell Pier, MD  empagliflozin (JARDIANCE) 25 MG TABS tablet Take 1 tablet (25 mg total) by mouth daily before breakfast. 12/10/21   Shamleffer, Melanie Crazier, MD  ezetimibe (ZETIA) 10 MG tablet Take 1 tablet (10 mg total) by mouth daily. Patient not taking: Reported on 02/06/2022 07/22/21   Loel Dubonnet, NP  ferrous sulfate 325 (65 FE) MG tablet Take 1 tablet (325 mg total) by mouth daily with breakfast. 04/11/21 04/11/22  Emily Donovan, PA-C  glucose blood test strip USE AS INSTRUCTED TO CHECK BLOOD SUGARS 3 TIMES DAILY Patient taking differently: 1 each by Other route See admin instructions. Check blood sugar 4 times daily 03/27/20   Fulp, Cammie, MD  Insulin Glargine (BASAGLAR KWIKPEN) 100 UNIT/ML Inject 40 Units into the skin at bedtime. Patient taking differently: Inject 38 Units into the skin at bedtime. 10/17/21   Ladell Pier, MD  insulin lispro (HUMALOG KWIKPEN) 100 UNIT/ML KwikPen 12 units subcut three times a day with meals 10/17/21   Ladell Pier, MD  Insulin Pen Needle 32G X 4 MM MISC use as directed in the morning, at noon, in the evening, and at bedtime.  05/16/21   Shamleffer, Melanie Crazier, MD  Insulin Syringe-Needle U-100 (TRUEPLUS INSULIN SYRINGE) 31G X 5/16" 0.3 ML MISC Use to inject insulin daily. Patient taking differently: 1 each by Other route See admin instructions. Use to inject insulin daily. 05/06/18   Fulp, Cammie, MD  metoprolol succinate (TOPROL-XL) 100 MG 24 hr tablet Take 1 tablet (100 mg total) by mouth 2 (two) times daily. Take with or immediately following a meal. Patient taking differently: Take 100 mg by mouth daily. Take with or immediately following a meal. 05/14/21   Loel Dubonnet, NP  Multiple Vitamins-Minerals (CENTRUM SILVER PO) Take 1 tablet by mouth daily.    [provider]     Allergies:   No Known Allergies  Social History:   Social History   Socioeconomic History   Marital status: Single    Spouse name: Not on file   Number of children: Not on file   Years of education: Not on file   Highest education level: Not on file  Occupational History   Not on file  Tobacco Use   Smoking  status: Never   Smokeless tobacco: Never  Vaping Use   Vaping Use: Never used  Substance and Sexual Activity   Alcohol use: No   Drug use: No   Sexual activity: Not Currently  Other Topics Concern   Not on file  Social History Narrative   Lives with her sister and does not need any assistance with ADLs.     Social Determinants of Health   Financial Resource Strain: Not on file  Food Insecurity: Not on file  Transportation Needs: Not on file  Physical Activity: Not on file  Stress: Not on file  Social Connections: Not on file  Intimate Partner Violence: Not on file    Family History:   The patient's family history includes Diabetes in her sister; Heart attack in her father and mother. There is no history of High blood pressure or High Cholesterol.    ROS:  Please see the history of present illness.  All other ROS reviewed and negative.     Physical Exam/Data:   Vitals:   03/01/22 0754  03/01/22 0758 03/01/22 0800  BP: (!) 89/58 (!) 89/58 (!) 82/52  Pulse: (!) 33 (!) 37 (!) 46  Resp: _0 Temp: 97.7 F (36.5 C) 97.7 F (36.5 C)   TempSrc: Oral Oral   SpO2: 96% 96% 94%   No intake or output data in the 24 hours ending 03/01/22 0933    02/15/2022    4:44 AM 02/06/2022   10:55 AM 01/14/2022    3:00 PM  Last 3 Weights  Weight (lbs) 154 lb 145 lb 3.2 oz 146 lb  Weight (kg) 69.854 kg 65.862 kg 66.225 kg     There is no height or weight on file to calculate BMI.   Exam: See physician note  EKG:  The ECG that was done 03/01/2022 was personally reviewed and demonstrates see HPI   Laboratory Data:  Hematology Recent Labs  Lab 03/01/22 0900  WBC 9.9  RBC 3.84*  HGB 11.3*  HCT 35.4*  MCV 92.2  MCH 29.4  MCHC 31.9  RDW 13.3  PLT 146*    Radiology/Studies:  No results found.   Assessment and Plan:   1.  Permanent atrial fibrillation with slow ventricular response Patient presents with symptomatic bradycardia and hypotension.  Atrial fibrillation has been difficult to control in the past and her heart rate has been controlled on digoxin, diltiazem and metoprolol succinate.  These medications will be held and allowed to washout.  There is some concern for tachybradycardia syndrome and she will ultimately need to be seen by EP to consider pacemaker implantation.  Continue Eliquis 5 mg twice daily.  2.  Hypertension She has been hypotensive in the setting of bradycardia.  As noted, medications will be held allow to washout.  Continue to monitor blood pressure.  3.  Hyperlipidemia Continue Lipitor 40 mg daily.  Patient noted she is not taking Zetia.  We will hold off on reordering this for now.  4.  Diabetes mellitus Continue empagliflozin, insulin  Risk Assessment/Risk Scores:         CHA2DS2-VASc Score = 4   This indicates a 4.8% annual risk of stroke. The patient's score is based upon: CHF History: 1 HTN History: 1 Diabetes History: 1 Stroke  History: 0 Vascular Disease History: 0 Age Score: 0 Gender Score: 1      Severity of Illness: The appropriate patient status for this patient is INPATIENT. Inpatient status is judged to be reasonable  and necessary in order to provide the required intensity of service to ensure the patient's safety. The patient's presenting symptoms, physical exam findings, and initial radiographic and laboratory data in the context of their chronic comorbidities is felt to place them at high risk for further clinical deterioration. Furthermore, it is not anticipated that the patient will be medically stable for discharge from the hospital within 2 midnights of admission.   * I certify that at the point of admission it is my clinical judgment that the patient will require inpatient hospital care spanning beyond 2 midnights from the point of admission due to high intensity of service, high risk for further deterioration and high frequency of surveillance required.*   For questions or updates, please contact [facility] Please consult [email or web address] for contact info under     Signed, Richardson Dopp, PA-C  03/01/2022 9:33 AM

## 2022-03-02 DIAGNOSIS — I495 Sick sinus syndrome: Secondary | ICD-10-CM

## 2022-03-02 LAB — GLUCOSE, CAPILLARY
Glucose-Capillary: 107 mg/dL — ABNORMAL HIGH (ref 70–99)
Glucose-Capillary: 180 mg/dL — ABNORMAL HIGH (ref 70–99)
Glucose-Capillary: 221 mg/dL — ABNORMAL HIGH (ref 70–99)
Glucose-Capillary: 193 mg/dL — ABNORMAL HIGH (ref 70–99)

## 2022-03-02 LAB — HIV ANTIBODY (ROUTINE TESTING W REFLEX): HIV Screen 4th Generation wRfx: NONREACTIVE

## 2022-03-02 MED ORDER — ENOXAPARIN SODIUM 80 MG/0.8ML IJ SOSY
70.0000 mg | PREFILLED_SYRINGE | Freq: Two times a day (BID) | INTRAMUSCULAR | Status: AC
  Administered 2022-03-02: 70 mg via SUBCUTANEOUS
  Filled 2022-03-02: qty 0.8

## 2022-03-02 MED ORDER — SODIUM CHLORIDE 0.9 % IV SOLN: INTRAVENOUS | Status: DC

## 2022-03-02 MED ORDER — METOPROLOL TARTRATE 12.5 MG HALF TABLET
12.5000 mg | ORAL_TABLET | Freq: Two times a day (BID) | ORAL | Status: DC
  Administered 2022-03-02 – 2022-03-04 (×5): 12.5 mg via ORAL
  Filled 2022-03-02 (×5): qty 1

## 2022-03-02 NOTE — Progress Notes (Signed)
DAILY PROGRESS NOTE   Patient Name: Emily Farmer Date of Encounter: 03/02/2022 Cardiologist: Chrystie Nose, MD  Chief Complaint   No complaints  Patient Profile   Emily Farmer is a 66 y.o. female with permanent atrial fibrillation, HFpEF, mitral regurgitation, tricuspid regurgitation, diabetes mellitus, hypertriglyceridemia, history of pancreatitis, hypertension, anemia, sleep apnea who is being seen 03/01/2022 for the evaluation of symptomatic bradycardia.  Subjective   Not surprisingly she has gone into afib with RVR now, after withholding her AVN blocking medications. BNP milldly elevated at 229.6. Creatinine 1.35. Potassium 4.8. small drop in H/H to 11.3/35.4. Digoxin level 0.5 - this has been stopped. TSH normal.  Objective   Vitals:   03/02/22 0500 03/02/22 0640 03/02/22 0723 03/02/22 0732  BP:  (!) 142/86 (!) 153/96   Pulse:  95 (!) 111 99  Resp:  20 19   Temp:  97.7 F (36.5 C) 97.7 F (36.5 C)   TempSrc:  Oral Oral   SpO2:  96% 99%   Weight: 67.4 kg     Height:        Intake/Output Summary (Last 24 hours) at 03/02/2022 0840 Last data filed at 03/02/2022 0600 Gross per 24 hour  Intake 360 ml  Output 300 ml  Net 60 ml   Filed Weights   03/01/22 1512 03/02/22 0500  Weight: 68.6 kg 67.4 kg    Physical Exam   General appearance: alert and no distress Neck: no carotid bruit, no JVD, and thyroid not enlarged, symmetric, no tenderness/mass/nodules Lungs: clear to auscultation bilaterally Heart: irregularly irregular rhythm and tachycardic Abdomen: soft, non-tender; bowel sounds normal; no masses,  no organomegaly Extremities: extremities normal, atraumatic, no cyanosis or edema Pulses: 2+ and symmetric Skin: Skin color, texture, turgor normal. No rashes or lesions Neurologic: Grossly normal Psych: Pleasant  Inpatient Medications    Scheduled Meds:  apixaban  5 mg Oral BID   aspirin EC  81 mg Oral Daily   atorvastatin  40 mg Oral Daily    empagliflozin  25 mg Oral QAC breakfast   ferrous sulfate  325 mg Oral Q breakfast   insulin aspart  0-15 Units Subcutaneous TID WC   insulin aspart  0-5 Units Subcutaneous QHS   insulin glargine-yfgn  38 Units Subcutaneous QHS   multivitamin with minerals  1 tablet Oral Daily    Continuous Infusions:   PRN Meds: acetaminophen, diclofenac Sodium, nitroGLYCERIN, ondansetron (ZOFRAN) IV   Labs   Results for orders placed or performed during the hospital encounter of 03/01/22 (from the past 48 hour(s))  CBC with Differential     Status: Abnormal   Collection Time: 03/01/22  9:00 AM  Result Value Ref Range   WBC 9.9 4.0 - 10.5 K/uL   RBC 3.84 (L) 3.87 - 5.11 MIL/uL   Hemoglobin 11.3 (L) 12.0 - 15.0 g/dL   HCT 62.2 (L) 29.7 - 98.9 %   MCV 92.2 80.0 - 100.0 fL   MCH 29.4 26.0 - 34.0 pg   MCHC 31.9 30.0 - 36.0 g/dL   RDW 21.1 94.1 - 74.0 %   Platelets 146 (L) 150 - 400 K/uL   nRBC 0.0 0.0 - 0.2 %   Neutrophils Relative % 72 %   Neutro Abs 7.1 1.7 - 7.7 K/uL   Lymphocytes Relative 16 %   Lymphs Abs 1.6 0.7 - 4.0 K/uL   Monocytes Relative 8 %   Monocytes Absolute 0.8 0.1 - 1.0 K/uL   Eosinophils Relative 3 %  Eosinophils Absolute 0.3 0.0 - 0.5 K/uL   Basophils Relative 1 %   Basophils Absolute 0.1 0.0 - 0.1 K/uL   Immature Granulocytes 0 %   Abs Immature Granulocytes 0.04 0.00 - 0.07 K/uL    Comment: Performed at [facility] [address] [place], [place] [address]  Comprehensive metabolic panel     Status: Abnormal   Collection Time: 03/01/22  9:00 AM  Result Value Ref Range   Sodium 137 135 - 145 mmol/L   Potassium 4.8 3.5 - 5.1 mmol/L   Chloride 107 98 - 111 mmol/L   CO2 20 (L) 22 - 32 mmol/L   Glucose, Bld 222 (H) 70 - 99 mg/dL    Comment: Glucose reference range applies only to samples taken after fasting for at least 8 hours.   BUN 62 (H) 8 - 23 mg/dL   Creatinine, Ser 1.35 (H) 0.44 - 1.00 mg/dL   Calcium 9.0 8.9 - 10.3 mg/dL   Total Protein 5.8 (L) 6.5  - 8.1 g/dL   Albumin 3.3 (L) 3.5 - 5.0 g/dL   AST 44 (H) 15 - 41 U/L   ALT 29 0 - 44 U/L   Alkaline Phosphatase 67 38 - 126 U/L   Total Bilirubin 0.5 0.3 - 1.2 mg/dL   GFR, Estimated 44 (L) >60 mL/min    Comment: (NOTE) Calculated using the CKD-EPI Creatinine Equation (2021)    Anion gap 10 5 - 15    Comment: Performed at [facility], [place] [address], [place], [place] [address]  Magnesium     Status: None   Collection Time: 03/01/22  9:00 AM  Result Value Ref Range   Magnesium 2.0 1.7 - 2.4 mg/dL    Comment: Performed at [facility], [place] [address], [place], [place] [address]  Brain natriuretic peptide     Status: Abnormal   Collection Time: 03/01/22  9:00 AM  Result Value Ref Range   B Natriuretic Peptide 229.6 (H) 0.0 - 100.0 pg/mL    Comment: Performed at [facility] [address] [place], [place] [address]  Digoxin level     Status: Abnormal   Collection Time: 03/01/22  9:00 AM  Result Value Ref Range   Digoxin Level 0.5 (L) 0.8 - 2.0 ng/mL    Comment: Performed at [facility], [place] [address], [place], [place] [address]  TSH     Status: None   Collection Time: 03/01/22  9:00 AM  Result Value Ref Range   TSH 2.521 0.350 - 4.500 uIU/mL    Comment: Performed by a 3rd Generation assay with a functional sensitivity of <=0.01 uIU/mL. Performed at [facility], [place] [address], [place], [place] [address]   Glucose, capillary     Status: Abnormal   Collection Time: 03/01/22  9:18 PM  Result Value Ref Range   Glucose-Capillary 200 (H) 70 - 99 mg/dL    Comment: Glucose reference range applies only to samples taken after fasting for at least 8 hours.  Glucose, capillary     Status: Abnormal   Collection Time: 03/02/22  5:47 AM  Result Value Ref Range   Glucose-Capillary 107 (H) 70 - 99 mg/dL    Comment: Glucose reference range applies only to samples taken after fasting for at least 8 hours.    ECG   N/A  Telemetry   Afib with RVR -  Personally Reviewed  Radiology    No results found.  Cardiac Studies   N/A  Assessment   Principal Problem:   Atrial fibrillation with slow ventricular response (HCC) Active Problems:   Essential hypertension   Diabetes mellitus type 2 in obese (HCC)   Dyslipidemia   Tachycardia-bradycardia syndrome (HCC)   Plan   Ms. Ekstrand has afib with tachy/brady syndrome. She is now in afib with RVR after withholding her AVN blocking meds. Will order low metoprolol 12.5 mg BID for rate control. Continue to hold diltiazem and digoxin. Will ask EP to evaluate tomorrow, D/w Dr. Elberta Fortis. Keep NPO p MN for possible pacemaker tomorrow.  Time Spent Directly with Patient:  I have spent a total of 25 minutes with the patient reviewing hospital notes, telemetry, EKGs, labs and examining the patient as well as establishing an assessment and plan that was discussed personally with the patient.  > 50% of time was spent in direct patient care.  Length of Stay:  LOS: 1 day   Chrystie Nose, MD, [facility], FACP  [place]  [facility]  Medical Director of the Advanced Lipid Disorders &  Cardiovascular Risk Reduction Clinic Diplomate of the American Board of Clinical Lipidology Attending Cardiologist  Direct Dial: [phone]  Fax: [phone]  Website:  [email or web address] [email or web address] [place] 03/02/2022, 8:40 AM

## 2022-03-02 NOTE — Plan of Care (Signed)
  Problem: Activity: Goal: Risk for activity intolerance will decrease Outcome: Progressing   Problem: Coping: Goal: Level of anxiety will decrease Outcome: Progressing   Problem: Safety: Goal: Ability to remain free from injury will improve Outcome: Progressing   

## 2022-03-02 NOTE — Progress Notes (Signed)
ANTICOAGULATION CONSULT NOTE - Initial Consult  Pharmacy Consult for Lovenox Indication: atrial fibrillation  No Known Allergies  Patient Measurements: Height: '5\' 3"'  (160 cm) Weight: 67.4 kg (148 lb 9.4 oz) IBW/kg (Calculated) : 52.4 Heparin Dosing Weight: 66kg  Vital Signs: Temp: 97.9 F (36.6 C) (09/17 1103) Temp Source: Oral (09/17 1103) BP: 131/85 (09/17 1103) Pulse Rate: 86 (09/17 1103)  Labs: Recent Labs    03/01/22 0900  HGB 11.3*  HCT 35.4*  PLT 146*  CREATININE 1.35*    Estimated Creatinine Clearance: 38.8 mL/min (A) (by C-G formula based on SCr of 1.35 mg/dL (H)).   Medical History: Past Medical History:  Diagnosis Date   Arthritis    Atrial fibrillation ([place])    a. 09/2016 in setting of pancreatitis;  b. 09/2016 Echo: EF 65-60%, no rwma, Gr2 DD, mild MR, triv TR, PASP 55mHg;  CHA2DS2VASc = 4-->Eliquis 564mBID. c. Recurred 06/2018 in setting of GIB.   Chronic diastolic CHF (congestive heart failure) [place]   Diabetes mellitus    GI bleeding 06/2018   Gout    Hyperlipidemia    Hypertension    Hypertriglyceridemia    Pancreatitis    a. 09/2016 - Triglycerides 1,392 on admission.    Medications:  Medications Prior to Admission  Medication Sig Dispense Refill Last Dose   acetaminophen (TYLENOL) 325 MG tablet Take 2 tablets (650 mg total) by mouth every 6 (six) hours as needed for mild pain (or Fever >/= 101).   02/28/2022   apixaban (ELIQUIS) 5 MG TABS tablet Take 1 tablet (5 mg total) by mouth 2 (two) times daily. 60 tablet 5 03/01/2022 at 0630   atorvastatin (LIPITOR) 40 MG tablet Take 1 tablet (40 mg total) by mouth daily. 90 tablet 3 03/01/2022   diclofenac Sodium (VOLTAREN) 1 % GEL Apply 2 g topically 4 (four) times daily. Apply across lower back for back pain (Patient taking differently: Apply 2 g topically 4 (four) times daily as needed (back pain).) 100 g 0 03/01/2022   Dulaglutide (TRULICITY) 1.5 MGCR/7.5OHOPN Inject 1.5 mg into the skin once  a week. (Patient taking differently: Inject 1.5 mg into the skin once a week. Monday) 2 mL 6 02/24/2022   empagliflozin (JARDIANCE) 25 MG TABS tablet Take 1 tablet (25 mg total) by mouth daily before breakfast. 90 tablet 2 03/01/2022   ferrous sulfate 325 (65 FE) MG tablet Take 1 tablet (325 mg total) by mouth daily with breakfast. 30 tablet 4 03/01/2022   Insulin Glargine (BASAGLAR KWIKPEN) 100 UNIT/ML Inject 40 Units into the skin at bedtime. (Patient taking differently: Inject 42 Units into the skin at bedtime.) 15 mL 6 02/28/2022   insulin lispro (HUMALOG KWIKPEN) 100 UNIT/ML KwikPen 12 units subcut three times a day with meals (Patient taking differently: Inject 14 Units into the skin 3 (three) times daily with meals.) 30 mL 6 03/01/2022   Multiple Vitamins-Minerals (CENTRUM SILVER PO) Take 1 tablet by mouth daily.   03/01/2022   Blood Glucose Monitoring Suppl (TRUE METRIX METER) w/Device KIT Use to monitor blood sugar as directed (Patient taking differently: 1 each by Other route See admin instructions. Use to monitor blood sugar as directed) 1 kit 0    Blood Pressure Monitoring (BLOOD PRESSURE MONITOR/ARM) DEVI Check blood pressure once per day at least 2 hours after medications. 1 each 0    Continuous Blood Gluc Sensor (DEXCOM G6 SENSOR) MISC 1 Device by Does not apply route as directed. 3 each 11  Continuous Blood Gluc Transmit (DEXCOM G6 TRANSMITTER) MISC 1 Device by Does not apply route as directed. 1 each 3    ezetimibe (ZETIA) 10 MG tablet Take 1 tablet (10 mg total) by mouth daily. (Patient not taking: Reported on 02/06/2022) 90 tablet 3 Not Taking   glucose blood test strip USE AS INSTRUCTED TO CHECK BLOOD SUGARS 3 TIMES DAILY (Patient taking differently: 1 each by Other route See admin instructions. Check blood sugar 4 times daily) 100 each 11    Insulin Pen Needle 32G X 4 MM MISC use as directed in the morning, at noon, in the evening, and at bedtime. 400 each 3    Insulin Syringe-Needle  U-100 (TRUEPLUS INSULIN SYRINGE) 31G X 5/16" 0.3 ML MISC Use to inject insulin daily. (Patient taking differently: 1 each by Other route See admin instructions. Use to inject insulin daily.) 100 each 11    Scheduled:   aspirin EC  81 mg Oral Daily   atorvastatin  40 mg Oral Daily   empagliflozin  25 mg Oral QAC breakfast   enoxaparin (LOVENOX) injection  70 mg Subcutaneous Q12H   ferrous sulfate  325 mg Oral Q breakfast   insulin aspart  0-15 Units Subcutaneous TID WC   insulin aspart  0-5 Units Subcutaneous QHS   insulin glargine-yfgn  38 Units Subcutaneous QHS   metoprolol tartrate  12.5 mg Oral BID   multivitamin with minerals  1 tablet Oral Daily   Infusions:   [START ON 03/03/2022] sodium chloride      Assessment: Pt with PAF who was admitted for symptomatic bradycardia. She has been on apixaban for anticoagulation. Plan for possible PPM in AM. Bridge with lovenox tonight.   Scr 1.35 Hrb 11.3, plt 146  Goal of Therapy:  Anti-Xa level 0.6-1 units/ml 4hrs after LMWH dose given Monitor platelets by anticoagulation protocol: Yes   Plan:  Lovenox 77m SQ x1 tonight F/u with procedure in AM  MOnnie Boer PharmD, BCIDP, AAHIVP, CPP Infectious Disease Pharmacist 03/02/2022 3:28 PM

## 2022-03-03 ENCOUNTER — Other Ambulatory Visit (HOSPITAL_COMMUNITY): Payer: Medicaid Other

## 2022-03-03 ENCOUNTER — Encounter (HOSPITAL_COMMUNITY)
Admission: EM | Disposition: A | Discharge status: Home / Self Care | Payer: Self-pay | Source: Home / Self Care | Attending: Internal Medicine

## 2022-03-03 ENCOUNTER — Inpatient Hospital Stay (HOSPITAL_COMMUNITY): Payer: Self-pay

## 2022-03-03 DIAGNOSIS — I495 Sick sinus syndrome: Secondary | ICD-10-CM

## 2022-03-03 HISTORY — PX: PACEMAKER IMPLANT: EP1218

## 2022-03-03 LAB — CBC WITH DIFFERENTIAL/PLATELET
Abs Immature Granulocytes: 0.01 10*3/uL (ref 0.00–0.07)
Basophils Absolute: 0 10*3/uL (ref 0.0–0.1)
Basophils Relative: 1 %
Eosinophils Absolute: 0.3 10*3/uL (ref 0.0–0.5)
Eosinophils Relative: 4 %
HCT: 41.9 % (ref 36.0–46.0)
Hemoglobin: 13.7 g/dL (ref 12.0–15.0)
Immature Granulocytes: 0 %
Lymphocytes Relative: 23 %
Lymphs Abs: 1.5 10*3/uL (ref 0.7–4.0)
MCH: 29 pg (ref 26.0–34.0)
MCHC: 32.7 g/dL (ref 30.0–36.0)
MCV: 88.6 fL (ref 80.0–100.0)
Monocytes Absolute: 0.7 10*3/uL (ref 0.1–1.0)
Monocytes Relative: 11 %
Neutro Abs: 3.9 10*3/uL (ref 1.7–7.7)
Neutrophils Relative %: 61 %
Platelets: 181 10*3/uL (ref 150–400)
RBC: 4.73 MIL/uL (ref 3.87–5.11)
RDW: 13.2 % (ref 11.5–15.5)
WBC: 6.4 10*3/uL (ref 4.0–10.5)
nRBC: 0 % (ref 0.0–0.2)

## 2022-03-03 LAB — ECHOCARDIOGRAM COMPLETE
Area-P 1/2: 5.62 cm2
Calc EF: 41.4 %
Height: 63 in
S' Lateral: 3.3 cm
Single Plane A2C EF: 52.2 %
Single Plane A4C EF: 41.9 %
Weight: 2363.33 oz

## 2022-03-03 LAB — BASIC METABOLIC PANEL
Anion gap: 12 (ref 5–15)
BUN: 32 mg/dL — ABNORMAL HIGH (ref 8–23)
CO2: 26 mmol/L (ref 22–32)
Calcium: 10.3 mg/dL (ref 8.9–10.3)
Chloride: 102 mmol/L (ref 98–111)
Creatinine, Ser: 0.85 mg/dL (ref 0.44–1.00)
GFR, Estimated: >60 mL/min (ref >60)
Glucose, Bld: 113 mg/dL — ABNORMAL HIGH (ref 70–99)
Potassium: 4.5 mmol/L (ref 3.5–5.1)
Sodium: 140 mmol/L (ref 135–145)

## 2022-03-03 LAB — GLUCOSE, CAPILLARY
Glucose-Capillary: 99 mg/dL (ref 70–99)
Glucose-Capillary: 120 mg/dL — ABNORMAL HIGH (ref 70–99)
Glucose-Capillary: 122 mg/dL — ABNORMAL HIGH (ref 70–99)
Glucose-Capillary: 85 mg/dL (ref 70–99)
Glucose-Capillary: 164 mg/dL — ABNORMAL HIGH (ref 70–99)

## 2022-03-03 LAB — SURGICAL PCR SCREEN
MRSA, PCR: NEGATIVE
Staphylococcus aureus: NEGATIVE

## 2022-03-03 SURGERY — PACEMAKER IMPLANT

## 2022-03-03 MED ORDER — HEPARIN (PORCINE) IN NACL 1000-0.9 UT/500ML-% IV SOLN
INTRAVENOUS | Status: DC | PRN
  Administered 2022-03-03 (×3): 500 mL

## 2022-03-03 MED ORDER — METOPROLOL TARTRATE 5 MG/5ML IV SOLN
INTRAVENOUS | Status: DC | PRN
  Administered 2022-03-03: 5 mg via INTRAVENOUS

## 2022-03-03 MED ORDER — LIDOCAINE HCL (PF) 1 % IJ SOLN
INTRAMUSCULAR | Status: AC
  Filled 2022-03-03: qty 30

## 2022-03-03 MED ORDER — CEFAZOLIN SODIUM-DEXTROSE 2-4 GM/100ML-% IV SOLN
2.0000 g | INTRAVENOUS | Status: AC
  Administered 2022-03-03: 2 g via INTRAVENOUS

## 2022-03-03 MED ORDER — CEFAZOLIN SODIUM-DEXTROSE 2-4 GM/100ML-% IV SOLN
INTRAVENOUS | Status: AC
  Filled 2022-03-03: qty 100

## 2022-03-03 MED ORDER — CHLORHEXIDINE GLUCONATE 4 % EX LIQD
60.0000 mL | Freq: Once | CUTANEOUS | Status: DC
  Filled 2022-03-03: qty 60

## 2022-03-03 MED ORDER — LIDOCAINE HCL (PF) 1 % IJ SOLN
INTRAMUSCULAR | Status: DC | PRN
  Administered 2022-03-03: 60 mL

## 2022-03-03 MED ORDER — MIDAZOLAM HCL 5 MG/5ML IJ SOLN
INTRAMUSCULAR | Status: AC
  Filled 2022-03-03: qty 5

## 2022-03-03 MED ORDER — FENTANYL CITRATE (PF) 100 MCG/2ML IJ SOLN
INTRAMUSCULAR | Status: DC | PRN
  Administered 2022-03-03: 12.5 ug via INTRAVENOUS

## 2022-03-03 MED ORDER — METOPROLOL TARTRATE 5 MG/5ML IV SOLN
INTRAVENOUS | Status: AC
  Filled 2022-03-03: qty 5

## 2022-03-03 MED ORDER — SODIUM CHLORIDE 0.9 % IV SOLN
80.0000 mg | INTRAVENOUS | Status: AC
  Administered 2022-03-03: 80 mg
  Filled 2022-03-03: qty 2

## 2022-03-03 MED ORDER — SODIUM CHLORIDE 0.9 % IV SOLN: INTRAVENOUS | Status: DC

## 2022-03-03 MED ORDER — FENTANYL CITRATE (PF) 100 MCG/2ML IJ SOLN
INTRAMUSCULAR | Status: AC
  Filled 2022-03-03: qty 2

## 2022-03-03 MED ORDER — SODIUM CHLORIDE 0.9 % IV SOLN
INTRAVENOUS | Status: AC
  Filled 2022-03-03: qty 2

## 2022-03-03 MED ORDER — HEPARIN (PORCINE) IN NACL 1000-0.9 UT/500ML-% IV SOLN
INTRAVENOUS | Status: AC
  Filled 2022-03-03: qty 500

## 2022-03-03 MED ORDER — MIDAZOLAM HCL 5 MG/5ML IJ SOLN
INTRAMUSCULAR | Status: DC | PRN
  Administered 2022-03-03: 1 mg via INTRAVENOUS

## 2022-03-03 SURGICAL SUPPLY — 12 items
CABLE SURGICAL S-101-97-12 (CABLE) ×1 IMPLANT
CATH RIGHTSITE C315HIS02 (CATHETERS) IMPLANT
IPG PACE AZUR XT SR MRI W1SR01 (Pacemaker) IMPLANT
KIT MICROPUNCTURE NIT STIFF (SHEATH) IMPLANT
LEAD SELECT SECURE 3830 383069 (Lead) IMPLANT
PACE AZURE XT SR MRI W1SR01 (Pacemaker) ×1 IMPLANT
PAD DEFIB RADIO PHYSIO CONN (PAD) ×1 IMPLANT
SELECT SECURE 3830 383069 (Lead) ×1 IMPLANT
SHEATH 7FR PRELUDE SNAP 13 (SHEATH) IMPLANT
SLITTER 6232ADJ (MISCELLANEOUS) IMPLANT
TRAY PACEMAKER INSERTION (PACKS) ×1 IMPLANT
WIRE HI TORQ VERSACORE-J 145CM (WIRE) IMPLANT

## 2022-03-03 NOTE — Progress Notes (Signed)
Mobility Specialist Progress Note:   03/03/22 1543  Mobility  Activity Ambulated with assistance in hallway  Level of Assistance Contact guard assist, steadying assist  Assistive Device None  Distance Ambulated (ft) 230 ft  Activity Response Tolerated well  $Mobility charge 1 Mobility   Pt received in bed willing to participate in mobility. No complaints of pain. Left in bed with call bell in reach and all needs met.   [facility] Surveyor, mining Chat only

## 2022-03-03 NOTE — Progress Notes (Signed)
  Echocardiogram 2D Echocardiogram has been performed.  Emily Farmer 03/03/2022, 10:09 AM

## 2022-03-03 NOTE — Plan of Care (Signed)
  Problem: Clinical Measurements: Goal: Diagnostic test results will improve Outcome: Progressing Goal: Cardiovascular complication will be avoided Outcome: Progressing   

## 2022-03-03 NOTE — Progress Notes (Signed)
DAILY PROGRESS NOTE   Patient Name: Emily Farmer Date of Encounter: 03/03/2022 Cardiologist: Pixie Casino, MD  Chief Complaint   No complaints  Patient Profile   Emily Farmer is a 66 y.o. female with permanent atrial fibrillation, HFpEF, mitral regurgitation, tricuspid regurgitation, diabetes mellitus, hypertriglyceridemia, history of pancreatitis, hypertension, anemia, sleep apnea who is being seen 03/01/2022 for the evaluation of symptomatic bradycardia.  Subjective   No issues overnight-  HR improved on low dose BB.  Appreciate EP evaluation for pacer - tachy/brady syndrome.  Objective   Vitals:   03/02/22 1529 03/02/22 1922 03/03/22 0138 03/03/22 0714  BP: (!) 134/93 137/82 132/87 117/75  Pulse: (!) 103 (!) 101 80 99  Resp: 18 20 20 18   Temp: 97.9 F (36.6 C) 98 F (36.7 C) 97.7 F (36.5 C) (!) 97.5 F (36.4 C)  TempSrc: Oral Oral Oral Oral  SpO2: 96% 95% 94% 95%  Weight:   67 kg   Height:        Intake/Output Summary (Last 24 hours) at 03/03/2022 0911 Last data filed at 03/03/2022 0600 Gross per 24 hour  Intake 459.69 ml  Output --  Net 459.69 ml   Filed Weights   03/01/22 1512 03/02/22 0500 03/03/22 0138  Weight: 68.6 kg 67.4 kg 67 kg    Physical Exam   General appearance: alert and no distress Neck: no carotid bruit, no JVD, and thyroid not enlarged, symmetric, no tenderness/mass/nodules Lungs: clear to auscultation bilaterally Heart: irregularly irregular rhythm and tachycardic Abdomen: soft, non-tender; bowel sounds normal; no masses,  no organomegaly Extremities: extremities normal, atraumatic, no cyanosis or edema Pulses: 2+ and symmetric Skin: Skin color, texture, turgor normal. No rashes or lesions Neurologic: Grossly normal Psych: Pleasant  Inpatient Medications    Scheduled Meds:  aspirin EC  81 mg Oral Daily   atorvastatin  40 mg Oral Daily   empagliflozin  25 mg Oral QAC breakfast   ferrous sulfate  325 mg Oral Q breakfast    insulin aspart  0-15 Units Subcutaneous TID WC   insulin aspart  0-5 Units Subcutaneous QHS   insulin glargine-yfgn  38 Units Subcutaneous QHS   metoprolol tartrate  12.5 mg Oral BID   multivitamin with minerals  1 tablet Oral Daily    Continuous Infusions:  sodium chloride 50 mL/hr at 03/03/22 0136    PRN Meds: acetaminophen, diclofenac Sodium, nitroGLYCERIN, ondansetron (ZOFRAN) IV   Labs   Results for orders placed or performed during the hospital encounter of 03/01/22 (from the past 48 hour(s))  Glucose, capillary     Status: None   Collection Time: 03/01/22  3:09 PM  Result Value Ref Range   Glucose-Capillary 99 70 - 99 mg/dL    Comment: Glucose reference range applies only to samples taken after fasting for at least 8 hours.  Glucose, capillary     Status: Abnormal   Collection Time: 03/01/22  9:18 PM  Result Value Ref Range   Glucose-Capillary 200 (H) 70 - 99 mg/dL    Comment: Glucose reference range applies only to samples taken after fasting for at least 8 hours.  HIV Antibody (routine testing w rflx)     Status: None   Collection Time: 03/02/22  2:17 AM  Result Value Ref Range   HIV Screen 4th Generation wRfx Non Reactive Non Reactive    Comment: Performed at [facility], [place] [address], [place], [place] [address]  Glucose, capillary     Status: Abnormal  Collection Time: 03/02/22  5:47 AM  Result Value Ref Range   Glucose-Capillary 107 (H) 70 - 99 mg/dL    Comment: Glucose reference range applies only to samples taken after fasting for at least 8 hours.  Glucose, capillary     Status: Abnormal   Collection Time: 03/02/22 11:03 AM  Result Value Ref Range   Glucose-Capillary 180 (H) 70 - 99 mg/dL    Comment: Glucose reference range applies only to samples taken after fasting for at least 8 hours.  Glucose, capillary     Status: Abnormal   Collection Time: 03/02/22  3:31 PM  Result Value Ref Range   Glucose-Capillary 221 (H) 70 - 99 mg/dL    Comment:  Glucose reference range applies only to samples taken after fasting for at least 8 hours.  Glucose, capillary     Status: Abnormal   Collection Time: 03/02/22  9:03 PM  Result Value Ref Range   Glucose-Capillary 193 (H) 70 - 99 mg/dL    Comment: Glucose reference range applies only to samples taken after fasting for at least 8 hours.  Glucose, capillary     Status: Abnormal   Collection Time: 03/03/22  7:16 AM  Result Value Ref Range   Glucose-Capillary 120 (H) 70 - 99 mg/dL    Comment: Glucose reference range applies only to samples taken after fasting for at least 8 hours.    ECG   N/A  Telemetry   Afib with RVR - Personally Reviewed  Radiology    No results found.  Cardiac Studies   N/A  Assessment   Principal Problem:   Atrial fibrillation with slow ventricular response (HCC) Active Problems:   Essential hypertension   Diabetes mellitus type 2 in obese (HCC)   Dyslipidemia   Tachycardia-bradycardia syndrome (HCC)   Plan   Ms. Hibler has had some improved HR's on low dose metoprolol. Will need pacer for tachy/brady. Appreciate EP evaluation. Keep NPO for possible PPM today.  Time Spent Directly with Patient:  I have spent a total of 25 minutes with the patient reviewing hospital notes, telemetry, EKGs, labs and examining the patient as well as establishing an assessment and plan that was discussed personally with the patient.  > 50% of time was spent in direct patient care.  Length of Stay:  LOS: 2 days   Chrystie Nose, MD, [facility], FACP  [facility]  Medical Director of the [facility]  Cardiovascular Risk Reduction Clinic Diplomate of the American Board of Clinical Lipidology Attending Cardiologist  Direct Dial: [phone]  Fax: [phone]  Website:  [email or web address] Nicely Gilbert [place] 03/03/2022, 9:11 AM

## 2022-03-03 NOTE — Discharge Instructions (Signed)
After Your Pacemaker   You have a [employer]onic Pacemaker  ACTIVITY Do not lift your arm above shoulder height for 1 week after your procedure. After 7 days, you may progress as below.  You should remove your sling 24 hours after your procedure, unless otherwise instructed by your provider.     Tuesday March 11, 2022  Wednesday March 12, 2022 Thursday March 13, 2022 Friday March 14, 2022   Do not lift, push, pull, or carry anything over 10 pounds with the affected arm until 6 weeks (Tuesday April 15, 2022 ) after your procedure.   You may drive AFTER your wound check, unless you have been told otherwise by your provider.   Ask your healthcare provider when you can go back to work   INCISION/Dressing Resume Eliquis on 9/24 (Sunday) with PM dose.  If large square, outer bandage is left in place, this can be removed after 24 hours from your procedure. Do not remove steri-strips or glue as below.   Monitor your Pacemaker site for redness, swelling, and drainage. Call the device clinic at [phone] if you experience these symptoms or fever/chills.  If your incision is sealed with Steri-strips or staples, you may shower 7 days after your procedure or when told by your provider. Do not remove the steri-strips or let the shower hit directly on your site. You may wash around your site with soap and water.     Avoid lotions, ointments, or perfumes over your incision until it is well-healed.  You may use a hot tub or a pool AFTER your wound check appointment if the incision is completely closed.  Pacemaker Alerts:  Some alerts are vibratory and others beep. These are NOT emergencies. Please call our office to let us know. If this occurs at night or on weekends, it can wait until the next business day. Send a remote transmission.  If your device is capable of reading fluid status (for heart failure), you will be offered monthly monitoring to review this with you.   DEVICE  MANAGEMENT Remote monitoring is used to monitor your pacemaker from home. This monitoring is scheduled every 91 days by our office. It allows [place] to keep an eye on the functioning of your device to ensure it is working properly. You will routinely see your Electrophysiologist annually (more often if necessary).   You should receive your ID card for your new device in 4-8 weeks. Keep this card with you at all times once received. Consider wearing a medical alert bracelet or necklace.  Your Pacemaker may be MRI compatible. This will be discussed at your next office visit/wound check.  You should avoid contact with strong electric or magnetic fields.   Do not use amateur (ham) radio equipment or electric (arc) welding torches. MP3 player headphones with magnets should not be used. Some devices are safe to use if held at least 12 inches (30 cm) from your Pacemaker. These include power tools, lawn mowers, and speakers. If you are unsure if something is safe to use, ask your health care provider.  When using your cell phone, hold it to the ear that is on the opposite side from the Pacemaker. Do not leave your cell phone in a pocket over the Pacemaker.  You may safely use electric blankets, heating pads, computers, and microwave ovens.  Call the office right away if: You have chest pain. You feel more short of breath than you have felt before. You feel more light-headed than you have felt  before. Your incision starts to open up.  This information is not intended to replace advice given to you by your health care provider. Make sure you discuss any questions you have with your health care provider.

## 2022-03-03 NOTE — Consult Note (Addendum)
 ELECTROPHYSIOLOGY CONSULT NOTE    Patient ID: Emily Farmer MRN: [id number], DOB/AGE: 65/12/1955 65 y.o.  Admit date: 03/01/2022 Date of Consult: 03/03/2022  Primary Physician: Johnson, Deborah B, MD Primary Cardiologist: Kenneth C Hilty, MD  Electrophysiologist: Dr. Lambert   Referring Provider: Dr. Hilty  Patient Profile: [name] is a 65 y.o. female with a history of permanent atrial fibrillation, HFpEF, mitral regurgitation, tricuspid regurgitation, diabetes mellitus, hypertriglyceridemia, history of pancreatitis, hypertension, anemia, sleep apnea who is being seen 03/03/22 for the evaluation of symptomatic bradycardia at the request of Hilty.  HPI:  Emily Farmer is a 65 y.o. female with medical history as above.   Pt previously developed significant bradycardia on amidoarone while in sinus rhythm and this was discontinued.  She has since developed recurrent atrial fibrillation and has been managed with a rate control strategy with metoprolol succinate, diltiazem and digoxin.  She has been seen by EP as well as the atrial fibrillation clinic.  Potassium4.8 (09/16 0900) Magnesium  2.0 (09/16 0900) Creatinine, ser  1.35* (09/16 0900) PLT  146* (09/16 0900) HGB  11.3* (09/16 0900) WBC 9.9 (09/16 0900)  She presented to the emergency room today with complaints of dizziness and near syncope.  She was transported via EMS.  EMS noted heart rates in the 20s to 40s with blood pressure 88/46.  Electrocardiogram was personally reviewed and interpreted and demonstrated atrial fibrillation with heart rate 69.  Telemetry demonstrates heart rates dipping into the 20s and 30s at times.   Home digoxin, toprol 100 BID, and diltiazem 120 mg daily were held. With gradual improvement of rates -> tachycardia in 140-150s. Restarted cautiously on 12.5 mg lopressor BID and EP asked to see for pacemaker consideration.   Pt feeling OK this am without acute complaint. 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 No chest pain, edema, or undue SOB. She had a surgery on her R lung, but no left sided surgeries. R handed.    Past Medical History:  Diagnosis Date   Arthritis    Atrial fibrillation (HCC)    a. 09/2016 in setting of pancreatitis;  b. 09/2016 Echo: EF 65-60%, no rwma, Gr2 DD, mild MR, triv TR, PASP 30mmHg;  CHA2DS2VASc = 4-->Eliquis 5mg BID. c. Recurred 06/2018 in setting of GIB.   Chronic diastolic CHF (congestive heart failure) (HCC) 01/28/2018   Diabetes mellitus    GI bleeding 06/2018   Gout    Hyperlipidemia    Hypertension    Hypertriglyceridemia    Pancreatitis    a. 09/2016 - Triglycerides 1,392 on admission.     Surgical History:  Past Surgical History:  Procedure Laterality Date   BIOPSY  06/20/2018   Procedure: BIOPSY;  Surgeon: Magod, Marc, MD;  Location: [facility];  Service: Endoscopy;;   CHOLECYSTECTOMY N/A 01/04/2014   Procedure: LAPAROSCOPIC CHOLECYSTECTOMY WITH INTRAOPERATIVE CHOLANGIOGRAM;  Surgeon: Armando Ramirez, MD;  Location: [facility] OR;  Service: General;  Laterality: N/A;   COLONOSCOPY WITH PROPOFOL N/A 06/21/2018   Procedure: COLONOSCOPY WITH PROPOFOL;  Surgeon: Buccini, Robert, MD;  Location: [facility];  Service: Endoscopy;  Laterality: N/A;   ESOPHAGOGASTRODUODENOSCOPY (EGD) WITH PROPOFOL N/A 06/20/2018   Procedure: ESOPHAGOGASTRODUODENOSCOPY (EGD) WITH PROPOFOL;  Surgeon: Magod, Marc, MD;  Location: [facility];  Service: Endoscopy;  Laterality: N/A;   LUNG SURGERY       Medications Prior to Admission  Medication Sig Dispense Refill Last Dose   acetaminophen (TYLENOL) 325 MG tablet Take    ELECTROPHYSIOLOGY CONSULT NOTE    Patient ID: Emily Farmer MRN: [id number], DOB/AGE: 08/19/1955 65 y.o.  Admit date: 03/01/2022 Date of Consult: 03/03/2022  Primary Physician: Johnson, Deborah B, MD Primary Cardiologist: Kenneth C Hilty, MD  Electrophysiologist: Dr. Lambert   Referring Provider: Dr. Hilty  Patient Profile: Emily Farmer is a 65 y.o. female with a history of permanent atrial fibrillation, HFpEF, mitral regurgitation, tricuspid regurgitation, diabetes mellitus, hypertriglyceridemia, history of pancreatitis, hypertension, anemia, sleep apnea who is being seen 03/03/22 for the evaluation of symptomatic bradycardia at the request of Hilty.  HPI:  [name] is a 65 y.o. female with medical history as above.   Pt previously developed significant bradycardia on amidoarone while in sinus rhythm and this was discontinued.  She has since developed recurrent atrial fibrillation and has been managed with a rate control strategy with metoprolol succinate, diltiazem and digoxin.  She has been seen by EP as well as the atrial fibrillation clinic.  Potassium4.8 (09/16 0900) Magnesium  2.0 (09/16 0900) Creatinine, ser  1.35* (09/16 0900) PLT  146* (09/16 0900) HGB  11.3* (09/16 0900) WBC 9.9 (09/16 0900)  She presented to the emergency room today with complaints of dizziness and near syncope.  She was transported via EMS.  EMS noted heart rates in the 20s to 40s with blood pressure 88/46.  Electrocardiogram was personally reviewed and interpreted and demonstrated atrial fibrillation with heart rate 69.  Telemetry demonstrates heart rates dipping into the 20s and 30s at times.   Home digoxin, toprol 100 BID, and diltiazem 120 mg daily were held. With gradual improvement of rates -> tachycardia in 140-150s. Restarted cautiously on 12.5 mg lopressor BID and EP asked to see for pacemaker consideration.   Pt feeling OK this am without acute complaint. 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 No chest pain, edema, or undue SOB. She had a surgery on her R lung, but no left sided surgeries. R handed.    Past Medical History:  Diagnosis Date   Arthritis    Atrial fibrillation (HCC)    a. 09/2016 in setting of pancreatitis;  b. 09/2016 Echo: EF 65-60%, no rwma, Gr2 DD, mild MR, triv TR, PASP 30mmHg;  CHA2DS2VASc = 4-->Eliquis 5mg BID. c. Recurred 06/2018 in setting of GIB.   Chronic diastolic CHF (congestive heart failure) (HCC) 01/28/2018   Diabetes mellitus    GI bleeding 06/2018   Gout    Hyperlipidemia    Hypertension    Hypertriglyceridemia    Pancreatitis    a. 09/2016 - Triglycerides 1,392 on admission.     Surgical History:  Past Surgical History:  Procedure Laterality Date   BIOPSY  06/20/2018   Procedure: BIOPSY;  Surgeon: Magod, Marc, MD;  Location: [facility];  Service: Endoscopy;;   CHOLECYSTECTOMY N/A 01/04/2014   Procedure: LAPAROSCOPIC CHOLECYSTECTOMY WITH INTRAOPERATIVE CHOLANGIOGRAM;  Surgeon: Armando Ramirez, MD;  Location: [facility];  Service: General;  Laterality: N/A;   COLONOSCOPY WITH PROPOFOL N/A 06/21/2018   Procedure: COLONOSCOPY WITH PROPOFOL;  Surgeon: Buccini, Robert, MD;  Location: [facility];  Service: Endoscopy;  Laterality: N/A;   ESOPHAGOGASTRODUODENOSCOPY (EGD) WITH PROPOFOL N/A 06/20/2018   Procedure: ESOPHAGOGASTRODUODENOSCOPY (EGD) WITH PROPOFOL;  Surgeon: Magod, Marc, MD;  Location: [facility];  Service: Endoscopy;  Laterality: N/A;   LUNG SURGERY       Medications Prior to Admission  Medication Sig Dispense Refill Last Dose   acetaminophen (TYLENOL) 325 MG tablet Take    ELECTROPHYSIOLOGY CONSULT NOTE    Patient ID: Emily Farmer MRN: [id number], DOB/AGE: 08/19/1955 65 y.o.  Admit date: 03/01/2022 Date of Consult: 03/03/2022  Primary Physician: Johnson, Deborah B, MD Primary Cardiologist: Kenneth C Hilty, MD  Electrophysiologist: Dr. Lambert   Referring Provider: Dr. Hilty  Patient Profile: Emily Farmer is a 65 y.o. female with a history of permanent atrial fibrillation, HFpEF, mitral regurgitation, tricuspid regurgitation, diabetes mellitus, hypertriglyceridemia, history of pancreatitis, hypertension, anemia, sleep apnea who is being seen 03/03/22 for the evaluation of symptomatic bradycardia at the request of Hilty.  HPI:  [name] is a 65 y.o. female with medical history as above.   Pt previously developed significant bradycardia on amidoarone while in sinus rhythm and this was discontinued.  She has since developed recurrent atrial fibrillation and has been managed with a rate control strategy with metoprolol succinate, diltiazem and digoxin.  She has been seen by EP as well as the atrial fibrillation clinic.  Potassium4.8 (09/16 0900) Magnesium  2.0 (09/16 0900) Creatinine, ser  1.35* (09/16 0900) PLT  146* (09/16 0900) HGB  11.3* (09/16 0900) WBC 9.9 (09/16 0900)  She presented to the emergency room today with complaints of dizziness and near syncope.  She was transported via EMS.  EMS noted heart rates in the 20s to 40s with blood pressure 88/46.  Electrocardiogram was personally reviewed and interpreted and demonstrated atrial fibrillation with heart rate 69.  Telemetry demonstrates heart rates dipping into the 20s and 30s at times.   Home digoxin, toprol 100 BID, and diltiazem 120 mg daily were held. With gradual improvement of rates -> tachycardia in 140-150s. Restarted cautiously on 12.5 mg lopressor BID and EP asked to see for pacemaker consideration.   Pt feeling OK this am without acute complaint. 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 No chest pain, edema, or undue SOB. She had a surgery on her R lung, but no left sided surgeries. R handed.    Past Medical History:  Diagnosis Date   Arthritis    Atrial fibrillation (HCC)    a. 09/2016 in setting of pancreatitis;  b. 09/2016 Echo: EF 65-60%, no rwma, Gr2 DD, mild MR, triv TR, PASP 30mmHg;  CHA2DS2VASc = 4-->Eliquis 5mg BID. c. Recurred 06/2018 in setting of GIB.   Chronic diastolic CHF (congestive heart failure) (HCC) 01/28/2018   Diabetes mellitus    GI bleeding 06/2018   Gout    Hyperlipidemia    Hypertension    Hypertriglyceridemia    Pancreatitis    a. 09/2016 - Triglycerides 1,392 on admission.     Surgical History:  Past Surgical History:  Procedure Laterality Date   BIOPSY  06/20/2018   Procedure: BIOPSY;  Surgeon: Magod, Marc, MD;  [facility]: [facility];  Service: Endoscopy;;   CHOLECYSTECTOMY N/A 01/04/2014   Procedure: LAPAROSCOPIC CHOLECYSTECTOMY WITH INTRAOPERATIVE CHOLANGIOGRAM;  Surgeon: Armando Ramirez, MD;  Location: [facility];  Service: General;  Laterality: N/A;   COLONOSCOPY WITH PROPOFOL N/A 06/21/2018   Procedure: COLONOSCOPY WITH PROPOFOL;  Surgeon: Buccini, Robert, MD;  Location: [facility];  Service: Endoscopy;  Laterality: N/A;   ESOPHAGOGASTRODUODENOSCOPY (EGD) WITH PROPOFOL N/A 06/20/2018   Procedure: ESOPHAGOGASTRODUODENOSCOPY (EGD) WITH PROPOFOL;  Surgeon: Magod, Marc, MD;  Location: [facility];  Service: Endoscopy;  Laterality: N/A;   LUNG SURGERY       Medications Prior to Admission  Medication Sig Dispense Refill Last Dose   acetaminophen (TYLENOL) 325 MG tablet Take    ELECTROPHYSIOLOGY CONSULT NOTE    Patient ID: Emily Farmer MRN: [id number], DOB/AGE: 65/12/1955 65 y.o.  Admit date: 03/01/2022 Date of Consult: 03/03/2022  Primary Physician: Johnson, Deborah B, MD Primary Cardiologist: Kenneth C Hilty, MD  Electrophysiologist: Dr. Lambert   Referring Provider: Dr. Hilty  Patient Profile: [name] is a 65 y.o. female with a history of permanent atrial fibrillation, HFpEF, mitral regurgitation, tricuspid regurgitation, diabetes mellitus, hypertriglyceridemia, history of pancreatitis, hypertension, anemia, sleep apnea who is being seen 03/03/22 for the evaluation of symptomatic bradycardia at the request of Hilty.  HPI:  Emily Farmer is a 65 y.o. female with medical history as above.   Pt previously developed significant bradycardia on amidoarone while in sinus rhythm and this was discontinued.  She has since developed recurrent atrial fibrillation and has been managed with a rate control strategy with metoprolol succinate, diltiazem and digoxin.  She has been seen by EP as well as the atrial fibrillation clinic.  Potassium4.8 (09/16 0900) Magnesium  2.0 (09/16 0900) Creatinine, ser  1.35* (09/16 0900) PLT  146* (09/16 0900) HGB  11.3* (09/16 0900) WBC 9.9 (09/16 0900)  She presented to the emergency room today with complaints of dizziness and near syncope.  She was transported via EMS.  EMS noted heart rates in the 20s to 40s with blood pressure 88/46.  Electrocardiogram was personally reviewed and interpreted and demonstrated atrial fibrillation with heart rate 69.  Telemetry demonstrates heart rates dipping into the 20s and 30s at times.   Home digoxin, toprol 100 BID, and diltiazem 120 mg daily were held. With gradual improvement of rates -> tachycardia in 140-150s. Restarted cautiously on 12.5 mg lopressor BID and EP asked to see for pacemaker consideration.   Pt feeling OK this am without acute complaint. 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 No chest pain, edema, or undue SOB. She had a surgery on her R lung, but no left sided surgeries. R handed.    Past Medical History:  Diagnosis Date   Arthritis    Atrial fibrillation (HCC)    a. 09/2016 in setting of pancreatitis;  b. 09/2016 Echo: EF 65-60%, no rwma, Gr2 DD, mild MR, triv TR, PASP 30mmHg;  CHA2DS2VASc = 4-->Eliquis 5mg BID. c. Recurred 06/2018 in setting of GIB.   Chronic diastolic CHF (congestive heart failure) (HCC) 01/28/2018   Diabetes mellitus    GI bleeding 06/2018   Gout    Hyperlipidemia    Hypertension    Hypertriglyceridemia    Pancreatitis    a. 09/2016 - Triglycerides 1,392 on admission.     Surgical History:  Past Surgical History:  Procedure Laterality Date   BIOPSY  06/20/2018   Procedure: BIOPSY;  Surgeon: Magod, Marc, MD;  [facility]: [facility];  Service: Endoscopy;;   CHOLECYSTECTOMY N/A 01/04/2014   Procedure: LAPAROSCOPIC CHOLECYSTECTOMY WITH INTRAOPERATIVE CHOLANGIOGRAM;  Surgeon: Armando Ramirez, MD;  Location: [facility];  Service: General;  Laterality: N/A;   COLONOSCOPY WITH PROPOFOL N/A 06/21/2018   Procedure: COLONOSCOPY WITH PROPOFOL;  Surgeon: Buccini, Robert, MD;  Location: [facility];  Service: Endoscopy;  Laterality: N/A;   ESOPHAGOGASTRODUODENOSCOPY (EGD) WITH PROPOFOL N/A 06/20/2018   Procedure: ESOPHAGOGASTRODUODENOSCOPY (EGD) WITH PROPOFOL;  Surgeon: Magod, Marc, MD;  [facility]: [facility];  Service: Endoscopy;  Laterality: N/A;   LUNG SURGERY       Medications Prior to Admission  Medication Sig Dispense Refill Last Dose   acetaminophen (TYLENOL) 325 MG tablet Take

## 2022-03-04 ENCOUNTER — Encounter (HOSPITAL_COMMUNITY): Payer: Self-pay | Admitting: Internal Medicine

## 2022-03-04 ENCOUNTER — Inpatient Hospital Stay (HOSPITAL_COMMUNITY): Payer: Self-pay

## 2022-03-04 ENCOUNTER — Other Ambulatory Visit: Payer: Self-pay

## 2022-03-04 ENCOUNTER — Encounter: Payer: Self-pay | Admitting: Student

## 2022-03-04 LAB — GLUCOSE, CAPILLARY: Glucose-Capillary: 96 mg/dL (ref 70–99)

## 2022-03-04 MED ORDER — METOPROLOL SUCCINATE ER 100 MG PO TB24: 100.0000 mg | ORAL_TABLET | Freq: Every day | ORAL | Status: DC

## 2022-03-04 MED ORDER — METOPROLOL SUCCINATE ER 100 MG PO TB24
100.0000 mg | ORAL_TABLET | Freq: Every day | ORAL | 6 refills | Status: DC
  Filled 2022-03-04: qty 90, 90d supply, fill #0
  Filled 2022-07-02: qty 30, 30d supply, fill #1
  Filled 2022-08-09: qty 30, 30d supply, fill #2
  Filled 2022-09-12: qty 30, 30d supply, fill #3
  Filled 2022-10-20: qty 30, 30d supply, fill #4
  Filled 2022-11-21: qty 30, 30d supply, fill #5
  Filled 2023-01-28: qty 30, 30d supply, fill #6

## 2022-03-04 MED ORDER — APIXABAN 5 MG PO TABS: 5.0000 mg | ORAL_TABLET | Freq: Two times a day (BID) | ORAL | 5 refills | Status: DC

## 2022-03-04 MED ORDER — DIGOXIN 125 MCG PO TABS
0.1250 mg | ORAL_TABLET | Freq: Every day | ORAL | Status: DC
  Administered 2022-03-04: 0.125 mg via ORAL

## 2022-03-04 MED ORDER — DIGOXIN 125 MCG PO TABS: 0.1250 mg | ORAL_TABLET | Freq: Every day | ORAL | 6 refills | Status: DC

## 2022-03-04 NOTE — Discharge Summary (Addendum)
ELECTROPHYSIOLOGY PROCEDURE DISCHARGE SUMMARY    Patient ID: Emily Farmer,  MRN: [id number], DOB/AGE: Mar 10, 1956 66 y.o.  Admit date: 03/01/2022 Discharge date: 03/04/2022  Primary Care Physician: Ladell Pier, MD  Primary Cardiologist: Pixie Casino, MD  Electrophysiologist: Dr. Lovena Le   Primary Discharge Diagnosis:  Symptomatic bradycardia status post pacemaker implantation this admission  Secondary Discharge Diagnosis:  Permanent atrial fibrillation  No Known Allergies   Procedures This Admission:  1.  Implantation of a Medtronic single-chamber pacemaker on 9/18 by Dr. Lovena Le.. The patient received a medtronic azure generator with a medtronic 3830 lead There were no immediate post procedure complications.   2.  CXR on 03/04/2022 demonstrated no pneumothorax status post device implantation.    Brief HPI: Emily Farmer is a 66 y.o. female was admitted for symptomatic bradycardia and electrophysiology team asked to see for consideration of PPM implantation.  Past medical history includes permanent atrial fibrillation, HFpEF, mitral regurgitation, tricuspid regurgitation, diabetes mellitus, hypertriglyceridemia, history of pancreatitis, hypertension, anemia, and sleep apnea. The patient has had symptomatic bradycardia, her beta blockers were stopped, and she then developed atrial fibrillation with RVR.   Pt previously developed significant bradycardia on amidoarone while in sinus rhythm and this was discontinued.  She has since developed recurrent atrial fibrillation and has been managed with a rate control strategy with metoprolol succinate, diltiazem and digoxin. Risks, benefits, and alternatives to PPM implantation were reviewed with the patient who wished to proceed.   Hospital Course:  The patient was admitted and underwent implantation of a Medtronic single chamber PPM with details as outlined above.  She was monitored on telemetry overnight which demonstrated  atrial fibrillation in 90-100s.  Left chest was without hematoma or ecchymosis.  The device was interrogated and found to be functioning normally.  CXR was obtained and demonstrated no pneumothorax status post device implantation.  Wound care, arm mobility, and restrictions were reviewed with the patient.  The patient was examined and considered stable for discharge to home.    Anticoagulation resumption This patient should resume their Eliquis on  Sunday, 9/24 PM     Physical Exam: Vitals:   03/03/22 2216 03/04/22 0015 03/04/22 0437 03/04/22 0816  BP: 116/81 119/84 (!) 134/95 122/78  Pulse:  89 100 99  Resp:  '18 18 18  ' Temp:  97.6 F (36.4 C) 97.7 F (36.5 C) 97.8 F (36.6 C)  TempSrc:  Oral Oral Oral  SpO2:  96% 97% 93%  Weight:   65.9 kg   Height:        GEN- The patient is well appearing, alert and oriented x 3 today.   HEENT: normocephalic, atraumatic; sclera clear, conjunctiva pink; hearing intact; oropharynx clear; neck supple, no JVP Lymph- no cervical lymphadenopathy Lungs- Clear to ausculation bilaterally, normal work of breathing.  No wheezes, rales, rhonchi Heart- irregular rate and rhythm, no murmurs, rubs or gallops, PMI not laterally displaced GI- soft, non-tender, non-distended, bowel sounds present, no hepatosplenomegaly Extremities- no clubbing, cyanosis, or edema; DP/PT/radial pulses 2+ bilaterally MS- no significant deformity or atrophy Skin- warm and dry, no rash or lesion, left chest without hematoma/ecchymosis Psych- euthymic mood, full affect Neuro- strength and sensation are intact   Labs:   Lab Results  Component Value Date   WBC 6.4 03/03/2022   HGB 13.7 03/03/2022   HCT 41.9 03/03/2022   MCV 88.6 03/03/2022   PLT 181 03/03/2022    Recent Labs  Lab 03/01/22 0900 03/03/22 0910  NA 137 140  K 4.8 4.5  CL 107 102  CO2 20* 26  BUN 62* 32*  CREATININE 1.35* 0.85  CALCIUM 9.0 10.3  PROT 5.8*  --   BILITOT 0.5  --   ALKPHOS 67  --   ALT  29  --   AST 44*  --   GLUCOSE 222* 113*    Discharge Medications:  Allergies as of 03/04/2022   No Known Allergies      Medication List     TAKE these medications    acetaminophen 325 MG tablet Commonly known as: TYLENOL Take 2 tablets (650 mg total) by mouth every 6 (six) hours as needed for mild pain (or Fever >/= 101).   apixaban 5 MG Tabs tablet Commonly known as: ELIQUIS Take 1 tablet (5 mg total) by mouth 2 (two) times daily. Resume taking on 9/24 with evening dose. Start taking on: March 09, 2022 What changed:  additional instructions These instructions start on March 09, 2022. If you are unsure what to do until then, ask your doctor or other care provider.   atorvastatin 40 MG tablet Commonly known as: LIPITOR Take 1 tablet (40 mg total) by mouth daily.   Basaglar KwikPen 100 UNIT/ML Inject 40 Units into the skin at bedtime. What changed: how much to take   Blood Pressure Monitor/Arm Devi Check blood pressure once per day at least 2 hours after medications.   CENTRUM SILVER PO Take 1 tablet by mouth daily.   Dexcom G6 Sensor Misc 1 Device by Does not apply route as directed.   Dexcom G6 Transmitter Misc 1 Device by Does not apply route as directed.   diclofenac Sodium 1 % Gel Commonly known as: Voltaren Apply 2 g topically 4 (four) times daily. Apply across lower back for back pain What changed:  when to take this reasons to take this additional instructions   digoxin 0.125 MG tablet Commonly known as: LANOXIN Take 1 tablet (0.125 mg total) by mouth daily.   ezetimibe 10 MG tablet Commonly known as: Zetia Take 1 tablet (10 mg total) by mouth daily.   FeroSul 325 (65 FE) MG tablet Generic drug: ferrous sulfate Take 1 tablet (325 mg total) by mouth daily with breakfast.   glucose blood test strip USE AS INSTRUCTED TO CHECK BLOOD SUGARS 3 TIMES DAILY What changed:  how much to take how to take this when to take this additional  instructions   insulin lispro 100 UNIT/ML KwikPen Commonly known as: HumaLOG KwikPen 12 units subcut three times a day with meals What changed:  how much to take how to take this when to take this additional instructions   Insulin Syringe-Needle U-100 31G X 5/16" 0.3 ML Misc Commonly known as: TRUEplus Insulin Syringe Use to inject insulin daily. What changed:  how much to take how to take this when to take this   Jardiance 25 MG Tabs tablet Generic drug: empagliflozin Take 1 tablet (25 mg total) by mouth daily before breakfast.   metoprolol succinate 100 MG 24 hr tablet Commonly known as: TOPROL-XL Take 1 tablet (100 mg total) by mouth daily. Take with or immediately following a meal.   TechLite Pen Needles 32G X 4 MM Misc Generic drug: Insulin Pen Needle use as directed in the morning, at noon, in the evening, and at bedtime.   True Metrix Meter w/Device Kit Use to monitor blood sugar as directed What changed:  how much to take how to take this when to take this  Trulicity 1.5 LT/0.7DP Sopn Generic drug: Dulaglutide Inject 1.5 mg into the skin once a week. What changed: additional instructions        Disposition:    Follow-up Information     Ladell Pier, MD. Go on 03/27/2022.   Specialty: Internal Medicine Why: '@9' :30am Contact information: [address] [place] [address] [place] [address] [address] [facility]. [facility]. Go to.   Specialty: Cardiology Why: 10/2 @ 1:55pm Contact information: [address] [place] [id number] [place] [address] [phone]                Duration of Discharge Encounter: Greater than 30 minutes including physician time.  Signed, Mamie Levers, NP  03/04/2022 9:12 AM   EP Attending  Patient seen and examined. Agree with the findings as noted above. The patient is s/p PPM for tachybrady syndrome. She is  doing well. PPM interrogation under my direction demonstrates normal VVI PM function. CXR demonstrates good lead position and no PTX. Her wound is without hematoma. I have recommended we uptitrate her metoprolol and restart low dose digoxin. Usual followup.   Carleene Overlie Sable Knoles,MD

## 2022-03-04 NOTE — TOC Transition Note (Signed)
Transition of Care [facility]) - CM/SW Discharge Note   Patient Details  Name: Emily Farmer MRN: [id number] Date of Birth: 01/12/1956  [facility]) CM/SW Contact:  Zenon Mayo, RN Phone Number: 03/04/2022, 10:03 AM   Clinical Narrative:    Patient is for dc today, she will go to [facility] to get her medications, she has no other needs.          Patient Goals and CMS Choice        Discharge Placement                       Discharge Plan and Services                                     Social Determinants of Health (SDOH) Interventions     Readmission Risk Interventions    04/03/2021    2:32 PM  Readmission Risk Prevention Plan  Transportation Screening Complete  PCP or Specialist Appt within 5-7 Days Complete  Home Care Screening Complete  Medication Review (RN CM) Complete

## 2022-03-05 ENCOUNTER — Telehealth: Payer: Self-pay

## 2022-03-05 ENCOUNTER — Other Ambulatory Visit: Payer: Self-pay | Admitting: Cardiology

## 2022-03-05 ENCOUNTER — Other Ambulatory Visit: Payer: Self-pay

## 2022-03-05 MED ORDER — DIGOXIN 125 MCG PO TABS
0.1250 mg | ORAL_TABLET | Freq: Every day | ORAL | 3 refills | Status: DC
  Filled 2022-03-05: qty 90, 90d supply, fill #0
  Filled 2022-05-28: qty 30, 30d supply, fill #1
  Filled 2022-07-08: qty 30, 30d supply, fill #2
  Filled 2022-08-09: qty 30, 30d supply, fill #3
  Filled 2022-09-12: qty 30, 30d supply, fill #4
  Filled 2022-10-20: qty 30, 30d supply, fill #5
  Filled 2022-11-21: qty 30, 30d supply, fill #6
  Filled 2022-12-24 – 2022-12-25 (×2): qty 30, 30d supply, fill #7

## 2022-03-05 NOTE — Telephone Encounter (Signed)
Transition Care Management Unsuccessful Follow-up Telephone Call  Date of discharge and from where:  03/04/2022, [facility]  Attempts:  1st Attempt  Reason for unsuccessful TCM follow-up call:  Unable to reach patient - I called # [phone] twice and the recording stated that the call cannot be completed at this time.   She has a follow up appointment with Dr Wynetta Emery - 03/27/2022.

## 2022-03-06 ENCOUNTER — Telehealth: Payer: Self-pay

## 2022-03-06 ENCOUNTER — Other Ambulatory Visit: Payer: Self-pay

## 2022-03-06 NOTE — Telephone Encounter (Signed)
Transition Care Management Unsuccessful Follow-up Telephone Call  Date of discharge and from where:  03/04/2022, [facility]     Attempts:  2nd Attempt  Reason for unsuccessful TCM follow-up call:  Unable to reach patient - I called # [phone] twice and the recording stated that the call cannot be completed as dialed.    She has a follow up appointment with Dr Wynetta Emery - 03/27/2022.

## 2022-03-10 ENCOUNTER — Telehealth: Payer: Self-pay

## 2022-03-10 NOTE — Telephone Encounter (Signed)
Transition Care Management Unsuccessful Follow-up Telephone Call  Date of discharge and from where:  03/04/2022, [facility]  Attempts:  3rd Attempt  Reason for unsuccessful TCM follow-up call:  Unable to reach patient- I called # [phone] and the recording stated that the call cannot be completed as dialed.    She has a follow up appointment at [facility] with Dr Wynetta Emery - 03/27/2022.

## 2022-03-16 NOTE — Progress Notes (Unsigned)
Cardiology Office Note Date:  03/17/2022  Patient ID:  Emily Farmer, Emily Farmer [birth year], MRN [id number] PCP:  Ladell Pier, MD  Cardiologist:  Dr. Debara Pickett Electrophysiologist: Dr. Lovena Le    Chief Complaint:  wound check visit  History of Present Illness: Emily Farmer is a 66 y.o. female with history of chronic CHF (HFpEF), DM, HTN, HLD, h/o pancreatitis, apnea, AFib (permanent), tachy-brady > PPM.  She as admitted 03/01/22 with symptomatic bradycardia rates towards 20's-30's, and hypotensive, on a number of meds for rate control. Underwent PPM implant 03/03/22 Discharged 03/04/22 To resume Eliquis 9/24 Home rate control meds resumed.  Dig level was 0.5  TODAY She is doing very well No wound concerns, steri strips remain, no pain. No Cp, palpitations Denies any SOB. DOE No near syncope or syncope. No bleeding or signs of bleeding    Device information MDT single chamber PPM implanted 03/03/22 RV lead is LB/septal position   Past Medical History:  Diagnosis Date   Arthritis    Atrial fibrillation ([place])    a. 09/2016 in setting of pancreatitis;  b. 09/2016 Echo: EF 65-60%, no rwma, Gr2 DD, mild MR, triv TR, PASP 12mHg;  CHA2DS2VASc = 4-->Eliquis 541mBID. c. Recurred 06/2018 in setting of GIB.   Chronic diastolic CHF (congestive heart failure) ([place]/15/2019   Diabetes mellitus    GI bleeding 06/2018   Gout    Hyperlipidemia    Hypertension    Hypertriglyceridemia    Pancreatitis    a. 09/2016 - Triglycerides 1,392 on admission.    Past Surgical History:  Procedure Laterality Date   BIOPSY  06/20/2018   Procedure: BIOPSY;  Surgeon: MaClarene EssexMD;  [facility]: [facility];;   CHOLECYSTECTOMY N/A 01/04/2014   Procedure: LAPAROSCOPIC CHOLECYSTECTOMY WITH INTRAOPERATIVE CHOLANGIOGRAM;  Surgeon: ArRalene OkMD;  [facility]: General;  Laterality: N/A;   COLONOSCOPY WITH PROPOFOL N/A 06/21/2018   Procedure: COLONOSCOPY WITH  PROPOFOL;  Surgeon: BuRonald LoboMD;  [facility]: [facility];  Laterality: N/A;   ESOPHAGOGASTRODUODENOSCOPY (EGD) WITH PROPOFOL N/A 06/20/2018   Procedure: ESOPHAGOGASTRODUODENOSCOPY (EGD) WITH PROPOFOL;  Surgeon: MaClarene EssexMD;  [facility]: Endoscopy;  Laterality: N/A;   LUNG SURGERY     PACEMAKER IMPLANT N/A 03/03/2022   Procedure: PACEMAKER IMPLANT;  Surgeon: TaEvans LanceMD;  [facility];  Service: Cardiovascular;  Laterality: N/A;    Current Outpatient Medications  Medication Sig Dispense Refill   acetaminophen (TYLENOL) 325 MG tablet Take 2 tablets (650 mg total) by mouth every 6 (six) hours as needed for mild pain (or Fever >/= 101).     apixaban (ELIQUIS) 5 MG TABS tablet Take 1 tablet (5 mg total) by mouth 2 (two) times daily. Resume taking on 9/24 with evening dose. 60 tablet 5   atorvastatin (LIPITOR) 40 MG tablet Take 1 tablet (40 mg total) by mouth daily. 90 tablet 3   Blood Glucose Monitoring Suppl (TRUE METRIX METER) w/Device KIT Use to monitor blood sugar as directed (Patient taking differently: 1 each by Other route See admin instructions. Use to monitor blood sugar as directed) 1 kit 0   Blood Pressure Monitoring (BLOOD PRESSURE MONITOR/ARM) DEVI Check blood pressure once per day at least 2 hours after medications. 1 each 0   Continuous Blood Gluc Sensor (DEXCOM G6 SENSOR) MISC 1 Device by Does not apply route as directed. 3 each 11   Continuous Blood Gluc Transmit (DEXCOM  G6 TRANSMITTER) MISC 1 Device by Does not apply route as directed. 1 each 3   diclofenac Sodium (VOLTAREN) 1 % GEL Apply 2 g topically 4 (four) times daily. Apply across lower back for back pain (Patient taking differently: Apply 2 g topically 4 (four) times daily as needed (back pain).) 100 g 0   digoxin (LANOXIN) 0.125 MG tablet Take 1 tablet (0.125 mg total) by mouth daily. 90 tablet 3   empagliflozin (JARDIANCE) 25 MG TABS tablet Take 1  tablet (25 mg total) by mouth daily before breakfast. 90 tablet 2   ezetimibe (ZETIA) 10 MG tablet Take 1 tablet (10 mg total) by mouth daily. 90 tablet 3   ferrous sulfate 325 (65 FE) MG tablet Take 1 tablet (325 mg total) by mouth daily with breakfast. 30 tablet 4   glucose blood test strip USE AS INSTRUCTED TO CHECK BLOOD SUGARS 3 TIMES DAILY (Patient taking differently: 1 each by Other route See admin instructions. Check blood sugar 4 times daily) 100 each 11   Insulin Glargine (BASAGLAR KWIKPEN) 100 UNIT/ML Inject 40 Units into the skin at bedtime. (Patient taking differently: Inject 42 Units into the skin at bedtime.) 15 mL 6   insulin lispro (HUMALOG KWIKPEN) 100 UNIT/ML KwikPen 12 units subcut three times a day with meals (Patient taking differently: Inject 14 Units into the skin 3 (three) times daily with meals.) 30 mL 6   Insulin Pen Needle 32G X 4 MM MISC use as directed in the morning, at noon, in the evening, and at bedtime. 400 each 3   Insulin Syringe-Needle U-100 (TRUEPLUS INSULIN SYRINGE) 31G X 5/16" 0.3 ML MISC Use to inject insulin daily. (Patient taking differently: 1 each by Other route See admin instructions. Use to inject insulin daily.) 100 each 11   liraglutide (VICTOZA) 18 MG/3ML SOPN Start by injecting 0.85m into the skin once a day for 7 days, then increase to 1.270monce a day 6 mL 2   metoprolol succinate (TOPROL-XL) 100 MG 24 hr tablet Take 1 tablet (100 mg total) by mouth daily. Take with or immediately following a meal. 90 tablet 6   Multiple Vitamins-Minerals (CENTRUM SILVER PO) Take 1 tablet by mouth daily.     No current facility-administered medications for this visit.    Allergies:   Patient has no known allergies.   Social History:  The patient  reports that she has never smoked. She has never used smokeless tobacco. She reports that she does not drink alcohol and does not use drugs.   Family History:  The patient's family history includes Diabetes in her  sister; Heart attack in her father and mother.  ROS:  Please see the history of present illness.    All other systems are reviewed and otherwise negative.   PHYSICAL EXAM:  VS:  BP 92/60   Pulse 80   Ht 5' 3" (1.6 m)   Wt 148 lb (67.1 kg)   SpO2 98%   BMI 26.22 kg/m  BMI: Body mass index is 26.22 kg/m. Well nourished, well developed, in no acute distress HEENT: normocephalic, atraumatic Neck: no JVD, carotid bruits or masses Cardiac:  irreg-irreg; no significant murmurs, no rubs, or gallops Lungs:  CTA b/l, no wheezing, rhonchi or rales Abd: soft, nontender MS: no deformity or atrophy Ext: no edema Skin: warm and dry, no rash Neuro:  No gross deficits appreciated Psych: euthymic mood, full affect  PPM site: steri strips are removed without difficulty Wound edges are well approximated  No bleeding or hematoma No erythema, edema or fluctuation    EKG:  not done today  Device interrogation done today and reviewed by myself:  Battery and lead measurements are good No HVR episodes   03/03/22: TTE  1. Left ventricular ejection fraction, by estimation, is 55 to 60%. The  left ventricle has normal function. The left ventricle has no regional  wall motion abnormalities. There is mild left ventricular hypertrophy of  the Posterior segment. Left  ventricular diastolic function could not be evaluated.   2. Right ventricular systolic function is normal. The right ventricular  size is normal. A Prominent RV moderator band is visualized. Tricuspid  regurgitation signal is inadequate for assessing PA pressure.   3. Right atrial size was moderately dilated.   4. The mitral valve is normal in structure. Trivial mitral valve  regurgitation. No evidence of mitral stenosis.   5. The aortic valve is tricuspid. There is moderate calcification of the  aortic valve. There is moderate thickening of the aortic valve. Aortic  valve regurgitation is not visualized. Aortic valve   sclerosis/calcification is present, without any evidence  of aortic stenosis.   6. The inferior vena cava is normal in size with <50% respiratory  variability, suggesting right atrial pressure of 8 mmHg.   Recent Labs: 03/01/2022: ALT 29; B Natriuretic Peptide 229.6; Magnesium 2.0; TSH 2.521 03/03/2022: BUN 32; Creatinine, Ser 0.85; Hemoglobin 13.7; Platelets 181; Potassium 4.5; Sodium 140  04/21/2021: Cholesterol 132; HDL 33; LDL Cholesterol 46; Total CHOL/HDL Ratio 4.0; Triglycerides 263; VLDL 53   Estimated Creatinine Clearance: 61.5 mL/min (by C-G formula based on SCr of 0.85 mg/dL).   Wt Readings from Last 3 Encounters:  03/17/22 148 lb (67.1 kg)  03/04/22 145 lb 3.2 oz (65.9 kg)  02/15/22 154 lb (69.9 kg)     Other studies reviewed: Additional studies/records reviewed today include: summarized above  ASSESSMENT AND PLAN:  PPM Wound is well healed, no sign of infection Intact function Acute implant output remains No programming changes made  Permanent afib CHA2DS2Vasc is 4, on Eliquis,  appropriately dosed fair rates Follow No BP room for more nodal blocking agent, hesitant to increase dig, follow at her next visit with more data  HTN No changes   HFpEF No symptoms or exam findings to suggest volume OL C/w Dr. Mindi Curling   Disposition: F/u with Dr. Lovena Le as scheduled, sooner if needed  Current medicines are reviewed at length with the patient today.  The patient did not have any concerns regarding medicines.  Venetia Night, PA-C 03/17/2022 1:43 PM     [place] [address] [phone] (office)  [phone] (fax)

## 2022-03-17 ENCOUNTER — Ambulatory Visit: Payer: Medicaid Other | Attending: Internal Medicine | Admitting: Pharmacist

## 2022-03-17 ENCOUNTER — Other Ambulatory Visit: Payer: Self-pay

## 2022-03-17 ENCOUNTER — Encounter: Payer: Self-pay | Admitting: Physician Assistant

## 2022-03-17 ENCOUNTER — Ambulatory Visit: Payer: Medicaid Other | Attending: Physician Assistant | Admitting: Physician Assistant

## 2022-03-17 VITALS — BP 92/60 | HR 80 | Ht 63.0 in | Wt 148.0 lb

## 2022-03-17 DIAGNOSIS — I5032 Chronic diastolic (congestive) heart failure: Secondary | ICD-10-CM

## 2022-03-17 DIAGNOSIS — E1165 Type 2 diabetes mellitus with hyperglycemia: Secondary | ICD-10-CM

## 2022-03-17 DIAGNOSIS — Z794 Long term (current) use of insulin: Secondary | ICD-10-CM

## 2022-03-17 DIAGNOSIS — I4821 Permanent atrial fibrillation: Secondary | ICD-10-CM

## 2022-03-17 DIAGNOSIS — Z5189 Encounter for other specified aftercare: Secondary | ICD-10-CM

## 2022-03-17 DIAGNOSIS — Z950 Presence of cardiac pacemaker: Secondary | ICD-10-CM

## 2022-03-17 LAB — CUP PACEART INCLINIC DEVICE CHECK
Battery Remaining Longevity: 190 mo
Battery Voltage: 3.23 V
Brady Statistic RV Percent Paced: 8.15 %
Date Time Interrogation Session: 20231002135031
Date Time Interrogation Session: 20231002175510
Implantable Lead Implant Date: 20230918
Implantable Lead Implant Date: 20230918
Implantable Lead Location: 753860
Implantable Lead Location: 753860
Implantable Lead Model: 3830
Implantable Lead Model: 3830
Implantable Pulse Generator Implant Date: 20230918
Implantable Pulse Generator Implant Date: 20230918
Lead Channel Impedance Value: 418 Ohm
Lead Channel Impedance Value: 589 Ohm
Lead Channel Pacing Threshold Amplitude: 0.625 V
Lead Channel Pacing Threshold Pulse Width: 0.4 ms
Lead Channel Sensing Intrinsic Amplitude: 12.75 mV
Lead Channel Sensing Intrinsic Amplitude: 14.75 mV
Lead Channel Setting Pacing Amplitude: 3.5 V
Lead Channel Setting Pacing Pulse Width: 0.4 ms
Lead Channel Setting Sensing Sensitivity: 1.2 mV

## 2022-03-17 MED ORDER — VICTOZA 18 MG/3ML ~~LOC~~ SOPN
PEN_INJECTOR | SUBCUTANEOUS | 2 refills | Status: DC
  Filled 2022-03-17: qty 6, 30d supply, fill #0
  Filled 2022-03-27: qty 6, 17d supply, fill #0

## 2022-03-17 NOTE — Patient Instructions (Addendum)
Medication Instructions:   Your physician recommends that you continue on your current medications as directed. Please refer to the Current Medication list given to you today.   *If you need a refill on your cardiac medications before your next appointment, please call your pharmacy*   Lab Work: [place]    If you have labs (blood work) drawn today and your tests are completely normal, you will receive your results only by: [place] (if you have MyChart) OR A paper copy in the mail If you have any lab test that is abnormal or we need to change your treatment, we will call you to review the results.   Testing/Procedures: NONE ORDERED  TODAY    Follow-Up: At [facility], you and your health needs are our priority.  As part of our continuing mission to provide you with exceptional heart care, we have created designated Provider Care Teams.  These Care Teams include your primary Cardiologist (physician) and Advanced Practice Providers (APPs -  Physician Assistants and Nurse Practitioners) who all work together to provide you with the care you need, when you need it.  We recommend signing up for the patient portal called "MyChart".  Sign up information is provided on this After Visit Summary.  MyChart is used to connect with patients for Virtual Visits (Telemedicine).  Patients are able to view lab/test results, encounter notes, upcoming appointments, etc.  Non-urgent messages can be sent to your provider as well.   To learn more about what you can do with MyChart, go to [email or web address].    Your next appointment:   2 month(s)  The format for your next appointment:   In Person  Provider:  Dr. Lovena Le    Other Instructions   Important Information About Sugar

## 2022-03-17 NOTE — Progress Notes (Signed)
S:    Emily Farmer is a 66 y.o. female who presents for diabetes evaluation, education, and management. PMH is significant for HTN, PAF, T2DM, and HLD. Patient was referred and last seen by Primary Care Provider, Dr. Wynetta Emery, on 02/06/2022.   At last visit, A1c was elevated at 10.7%. Lantus was increased from 38 to 42 units daily and Humalog was increased from 12 to 14 units with meals. Approximately 1 week later, EMS called to pt's home. She was diaphoretic and reported feeling unwell. Her BG was in the 60s.    Today, patient arrives in fair spirits and presents without any assistance. Recently let go from her job at [employer] due to having Pacemaker placed. Discussed recent ED visit. States she was getting ready for work and started to feel woozy after taking Jardiance (had NOT taken insulin yet). She resumed her insulin regimen as prescribed.   Family/Social History:  - FHx: diabetes (sister); heart attack (mom, dad) - Tobacco: never smoker - Alcohol: denies using alcohol  Current diabetes medications include: Basaglar 42 units QPM, Humalog 14 units TID AC, Jardiance 25 mg daily - Not taking Trulicity 3 mg (intolerant d/t diarrhea)   Patient reports adherence to taking all medications as prescribed.   Insurance coverage: None  Patient denies hypoglycemic events. ED visit seems to be an isolated event that was not provoked by insulin.  Reported home fasting blood sugars: 98 this morning. Ate oatmeal and CB increased to 102  Reported 2 hour post-meal/random blood sugars: mostly >200.  Patient reported dietary habits: Eats 3 meals/day. Largest meal either lunch or dinner Breakfast: oatmeal Lunch: Sandwich, hamburger Dinner: spaghetti, mac and cheese Drinks: water  O:  Brings her meter with her today.  7 day average blood glucose: 261   Lab Results  Component Value Date   HGBA1C 10.7 (A) 02/06/2022   Lipid Panel     Component Value Date/Time   CHOL 132 04/21/2021 0143    CHOL 214 (H) 06/05/2020 0837   TRIG 263 (H) 04/21/2021 0143   HDL 33 (L) 04/21/2021 0143   HDL 40 06/05/2020 0837   CHOLHDL 4.0 04/21/2021 0143   VLDL 53 (H) 04/21/2021 0143   LDLCALC 46 04/21/2021 0143   LDLCALC 110 (H) 06/05/2020 0837   LDLDIRECT 137 (H) 03/01/2020 1012   LDLDIRECT 96.0 09/23/2019 0828    Clinical Atherosclerotic Cardiovascular Disease (ASCVD): No  The 10-year ASCVD risk score (Arnett DK, et al., 2019) is: 11.5%   Values used to calculate the score:     Age: 82 years     Sex: Female     Is Non-Hispanic African American: No     Diabetic: Yes     Tobacco smoker: No     Systolic Blood Pressure: 938 mmHg     Is BP treated: Yes     HDL Cholesterol: 33 mg/dL     Total Cholesterol: 132 mg/dL   A/P: Diabetes longstanding currently not controlled based on most recent A1c (14.1%). Patient is able to verbalize appropriate hypoglycemia management plan. Medication adherence appears appropriate. Control is suboptimal due to dietary habits. -Continued basal insulin Basaglar (insulin glargine) 42 units daily.  -Continued rapid insulin Humalog (insulin lispro) 14 units TID AC. Consider increasing meal time insulin at next visit as fasting BG is controlled while post prandials are >200. -Restarted GLP-1 Victoza (liraglutide) 0.6 mg daily. Patient states she will be able to afford once she gets unemployment.   -Continued SGLT2-I Jardiance (empagliflozin) 25 mg  daily. Counseled on sick day rules. -Patient educated on purpose, proper use, and potential adverse effects of Victoza.  -Extensively discussed pathophysiology of diabetes, recommended lifestyle interventions, dietary effects on blood sugar control.  -Counseled on s/sx of and management of hypoglycemia.  -Next A1c anticipated November 2023.   Total time in face to face counseling 25 minutes.    Follow-up:  Pharmacist 4-6 weeks. PCP clinic visit on 03/27/2022.   Joseph Art, Pharm.D. PGY-2 [facility]ulatory [facility] Pharmacy  Resident 03/17/2022 10:58 AM

## 2022-03-24 ENCOUNTER — Other Ambulatory Visit: Payer: Self-pay

## 2022-03-27 ENCOUNTER — Other Ambulatory Visit: Payer: Self-pay

## 2022-03-27 ENCOUNTER — Encounter: Payer: Self-pay | Admitting: Internal Medicine

## 2022-03-27 ENCOUNTER — Ambulatory Visit: Payer: Self-pay | Attending: Internal Medicine | Admitting: Internal Medicine

## 2022-03-27 VITALS — BP 112/77 | HR 95 | Ht 63.0 in | Wt 151.8 lb

## 2022-03-27 DIAGNOSIS — Z950 Presence of cardiac pacemaker: Secondary | ICD-10-CM

## 2022-03-27 DIAGNOSIS — Z09 Encounter for follow-up examination after completed treatment for conditions other than malignant neoplasm: Secondary | ICD-10-CM

## 2022-03-27 DIAGNOSIS — E1165 Type 2 diabetes mellitus with hyperglycemia: Secondary | ICD-10-CM

## 2022-03-27 DIAGNOSIS — Z794 Long term (current) use of insulin: Secondary | ICD-10-CM

## 2022-03-27 DIAGNOSIS — N1832 Chronic kidney disease, stage 3b: Secondary | ICD-10-CM

## 2022-03-27 NOTE — Progress Notes (Signed)
Pt needs RF's on all meds.  Pt says she unable to afford Victoza

## 2022-03-27 NOTE — Progress Notes (Signed)
Patient ID: Emily Farmer, female    DOB: [birth year]  MRN: [id number]  CC: Hospitalization Follow-up   Subjective: Emily Farmer is a 66 y.o. 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  She tolerated the procedure well.  She was in atrial fibrillation on telemetry.  Chest x-ray revealed no pneumothorax postprocedure.  She was discharged in stable condition.  She saw the cardiology PA on 03/17/2022 post hospital discharge. Of note kidney function had improved with GFR going from 44 to >60 by the time of discharge.  Today: Patient reports she is feeling better.  Her last A1c was 10.7.  She never did get the Dexcom as she was unable to afford and she only has family-planning Medicaid. Taking Jardiance 25 mg daily, glargine 42 units daily and Humalog 14 units with meals.  On last visit we discontinued Trulicity because it was causing significant diarrhea for her.  We placed her back on Victoza.  She never got the Victoza because she was unable to afford at that time.  She had to prioritize paying her bills.  She checks blood sugars 3 times a day but did not bring log with her.  She tells me for the most part blood sugars have been in the 200s.  However yesterday morning before breakfast blood sugar was 127.   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   Bacteremia 04/01/2021   Atrial fibrillation with slow ventricular response ([place]) 03/31/2021   Pulmonary hypertension ([place]) 03/19/2021   Hypomagnesemia    Sepsis ([place]) 02/27/2021   Atrial fibrillation with rapid ventricular  response ([place]) 02/27/2021   Hypotension 02/27/2021   Secondary hypercoagulable state ([place]) 05/08/2020   Type 2 diabetes mellitus with diabetic polyneuropathy, with long-term current use of insulin ([place]) 09/23/2019   Uncontrolled type 2 diabetes mellitus with hyperglycemia, with long-term current use of insulin ([place]) 09/23/2019   Dyslipidemia 09/23/2019   Toenail fungus 07/19/2018   Depression 07/19/2018   Iron deficiency anemia due to chronic blood loss 07/19/2018   Gastritis and duodenitis 07/19/2018   Chronic a-fib ([place]) 01/28/2018   Hyponatremia 01/28/2018   Chronic diastolic CHF (congestive heart failure) ([place]) 01/28/2018   Mixed hyperlipidemia 06/01/2017   Paroxysmal A-fib ([place]) 10/04/2016   Hypokalemia 01/04/2014   Acute pyelonephritis 02/15/2013   Essential hypertension 02/15/2013   Hypertriglyceridemia 02/15/2013   Diabetes mellitus type 2 in obese ([place]) 02/15/2013   Coronary atherosclerosis seen on CT 02/15/2013     Current Outpatient Medications on File Prior to Visit  Medication Sig Dispense Refill   acetaminophen (TYLENOL) 325 MG tablet Take 2 tablets (650 mg total) by mouth every 6 (six) hours as needed for mild pain (or Fever >/= 101).     apixaban (ELIQUIS) 5 MG TABS tablet Take 1 tablet (5 mg total) by mouth 2 (two) times daily. Resume taking on 9/24 with evening dose. 60 tablet 5   atorvastatin (LIPITOR) 40 MG tablet Take 1 tablet (40 mg total) by mouth daily. 90 tablet 3   Blood Glucose Monitoring Suppl (TRUE METRIX METER) w/Device KIT Use to monitor blood sugar as directed (Patient taking differently: 1 each by Other route See admin instructions.  Use to monitor blood sugar as directed) 1 kit 0   Blood Pressure Monitoring (BLOOD PRESSURE MONITOR/ARM) DEVI Check blood pressure once per day at least 2 hours after medications. 1 each 0   Continuous Blood Gluc Sensor (DEXCOM G6 SENSOR) MISC 1 Device by Does not apply route as directed. 3 each 11   Continuous Blood Gluc  Transmit (DEXCOM G6 TRANSMITTER) MISC 1 Device by Does not apply route as directed. 1 each 3   diclofenac Sodium (VOLTAREN) 1 % GEL Apply 2 g topically 4 (four) times daily. Apply across lower back for back pain (Patient taking differently: Apply 2 g topically 4 (four) times daily as needed (back pain).) 100 g 0   digoxin (LANOXIN) 0.125 MG tablet Take 1 tablet (0.125 mg total) by mouth daily. 90 tablet 3   empagliflozin (JARDIANCE) 25 MG TABS tablet Take 1 tablet (25 mg total) by mouth daily before breakfast. 90 tablet 2   ezetimibe (ZETIA) 10 MG tablet Take 1 tablet (10 mg total) by mouth daily. 90 tablet 3   ferrous sulfate 325 (65 FE) MG tablet Take 1 tablet (325 mg total) by mouth daily with breakfast. 30 tablet 4   glucose blood test strip USE AS INSTRUCTED TO CHECK BLOOD SUGARS 3 TIMES DAILY (Patient taking differently: 1 each by Other route See admin instructions. Check blood sugar 4 times daily) 100 each 11   Insulin Glargine (BASAGLAR KWIKPEN) 100 UNIT/ML Inject 40 Units into the skin at bedtime. (Patient taking differently: Inject 42 Units into the skin at bedtime.) 15 mL 6   insulin lispro (HUMALOG KWIKPEN) 100 UNIT/ML KwikPen 12 units subcut three times a day with meals (Patient taking differently: Inject 14 Units into the skin 3 (three) times daily with meals.) 30 mL 6   Insulin Pen Needle 32G X 4 MM MISC use as directed in the morning, at noon, in the evening, and at bedtime. 400 each 3   Insulin Syringe-Needle U-100 (TRUEPLUS INSULIN SYRINGE) 31G X 5/16" 0.3 ML MISC Use to inject insulin daily. (Patient taking differently: 1 each by Other route See admin instructions. Use to inject insulin daily.) 100 each 11   liraglutide (VICTOZA) 18 MG/3ML SOPN Start by injecting 0.37m into the skin once a day for 7 days, then increase to 1.245monce a day 6 mL 2   metoprolol succinate (TOPROL-XL) 100 MG 24 hr tablet Take 1 tablet (100 mg total) by mouth daily. Take with or immediately following a meal.  90 tablet 6   Multiple Vitamins-Minerals (CENTRUM SILVER PO) Take 1 tablet by mouth daily.     No current facility-administered medications on file prior to visit.    Allergies  Allergen Reactions   Trulicity [Dulaglutide] Diarrhea    Social History   Socioeconomic History   Marital status: Single    Spouse name: Not on file   Number of children: Not on file   Years of education: Not on file   Highest education level: Not on file  Occupational History   Not on file  Tobacco Use   Smoking status: Never   Smokeless tobacco: Never  Vaping Use   Vaping Use: Never used  Substance and Sexual Activity   Alcohol use: No   Drug use: No   Sexual activity: Not Currently  Other Topics Concern   Not on file  Social History Narrative   Lives with her sister and does not need any assistance with ADLs.     Social Determinants of Health  Financial Resource Strain: Not on file  Food Insecurity: Not on file  Transportation Needs: Not on file  Physical Activity: Not on file  Stress: Not on file  Social Connections: Not on file  Intimate Partner Violence: Not on file    Family History  Problem Relation Age of Onset   Heart attack Mother    Heart attack Father    Diabetes Sister    High blood pressure Neg Hx    High Cholesterol Neg Hx     Past Surgical History:  Procedure Laterality Date   BIOPSY  06/20/2018   Procedure: BIOPSY;  Surgeon: Clarene Essex, MD;  Location: [facility];  Service: Endoscopy;;   CHOLECYSTECTOMY N/A 01/04/2014   Procedure: LAPAROSCOPIC CHOLECYSTECTOMY WITH INTRAOPERATIVE CHOLANGIOGRAM;  Surgeon: Ralene Ok, MD;  Location: [facility];  Service: General;  Laterality: N/A;   COLONOSCOPY WITH PROPOFOL N/A 06/21/2018   Procedure: COLONOSCOPY WITH PROPOFOL;  Surgeon: Ronald Lobo, MD;  Location: [facility];  Service: Endoscopy;  Laterality: N/A;   ESOPHAGOGASTRODUODENOSCOPY (EGD) WITH PROPOFOL N/A 06/20/2018   Procedure: ESOPHAGOGASTRODUODENOSCOPY (EGD) WITH  PROPOFOL;  Surgeon: Clarene Essex, MD;  Location: [facility];  Service: Endoscopy;  Laterality: N/A;   LUNG SURGERY     PACEMAKER IMPLANT N/A 03/03/2022   Procedure: PACEMAKER IMPLANT;  Surgeon: Evans Lance, MD;  Location: [facility];  Service: Cardiovascular;  Laterality: N/A;    ROS: Review of Systems Negative except as stated above  PHYSICAL EXAM: BP 112/77   Pulse 95   Ht '5\' 3"'  (1.6 m)   Wt 151 lb 12.8 oz (68.9 kg)   SpO2 97%   BMI 26.89 kg/m   Physical Exam  General appearance - alert, well appearing, older Caucasian female and in no distress Neck - supple, no significant adenopathy Chest - clear to auscultation, no wheezes, rales or rhonchi, symmetric air entry Heart -irregularly irregular rhythm but rate control Extremities - peripheral pulses normal, no pedal edema, no clubbing or cyanosis      Latest Ref Rng & Units 03/03/2022    9:10 AM 03/01/2022    9:00 AM 02/15/2022    4:44 AM  CMP  Glucose 70 - 99 mg/dL 113  222  128   BUN 8 - 23 mg/dL 32  62  52   Creatinine 0.44 - 1.00 mg/dL 0.85  1.35  1.39   Sodium 135 - 145 mmol/L 140  137  137   Potassium 3.5 - 5.1 mmol/L 4.5  4.8  3.6   Chloride 98 - 111 mmol/L 102  107  104   CO2 22 - 32 mmol/L '26  20  22   ' Calcium 8.9 - 10.3 mg/dL 10.3  9.0  9.5   Total Protein 6.5 - 8.1 g/dL  5.8    Total Bilirubin 0.3 - 1.2 mg/dL  0.5    Alkaline Phos 38 - 126 U/L  67    AST 15 - 41 U/L  44    ALT 0 - 44 U/L  29     Lipid Panel     Component Value Date/Time   CHOL 132 04/21/2021 0143   CHOL 214 (H) 06/05/2020 0837   TRIG 263 (H) 04/21/2021 0143   HDL 33 (L) 04/21/2021 0143   HDL 40 06/05/2020 0837   CHOLHDL 4.0 04/21/2021 0143   VLDL 53 (H) 04/21/2021 0143   LDLCALC 46 04/21/2021 0143   LDLCALC 110 (H) 06/05/2020 0837   LDLDIRECT 137 (H) 03/01/2020 1012   LDLDIRECT 96.0  09/23/2019 0828    CBC    Component Value Date/Time   WBC 6.4 03/03/2022 0910   RBC 4.73 03/03/2022 0910   HGB 13.7 03/03/2022 0910    HGB 12.6 04/11/2021 1447   HCT 41.9 03/03/2022 0910   HCT 39.0 04/11/2021 1447   PLT 181 03/03/2022 0910   PLT 421 04/11/2021 1447   MCV 88.6 03/03/2022 0910   MCV 87 04/11/2021 1447   MCH 29.0 03/03/2022 0910   MCHC 32.7 03/03/2022 0910   RDW 13.2 03/03/2022 0910   RDW 13.8 04/11/2021 1447   LYMPHSABS 1.5 03/03/2022 0910   LYMPHSABS 1.9 04/11/2021 1447   MONOABS 0.7 03/03/2022 0910   EOSABS 0.3 03/03/2022 0910   EOSABS 0.4 04/11/2021 1447   BASOSABS 0.0 03/03/2022 0910   BASOSABS 0.0 04/11/2021 1447    ASSESSMENT AND PLAN:  1. Hospital discharge follow-up   2. History of permanent cardiac pacemaker placement Stable.  Followed by cardiology.  3. Type 2 diabetes mellitus with hyperglycemia, with long-term current use of insulin ([place]) I spoke with our clinical pharmacist.  We can get her the Victoza through the patient assistance program and perhaps give samples today.  She is agreeable to this.  Advised that she keeps her follow-up appointment next month with the clinical pharmacist and bring blood sugar log with her for review.  4. Stage 3b chronic kidney disease ([place]) Improved when last checked to CKD 2.  We will continue to monitor. - Basic Metabolic Panel   Patient was given the opportunity to ask questions.  Patient verbalized understanding of the plan and was able to repeat key elements of the plan.   This documentation was completed using Radio producer.  Any transcriptional errors are unintentional.  No orders of the defined types were placed in this encounter.    Requested Prescriptions    No prescriptions requested or ordered in this encounter    No follow-ups on file.  Karle Plumber, MD, FACP

## 2022-03-28 LAB — BASIC METABOLIC PANEL
BUN/Creatinine Ratio: 43 — ABNORMAL HIGH (ref 12–28)
BUN: 48 mg/dL — ABNORMAL HIGH (ref 8–27)
CO2: 22 mmol/L (ref 20–29)
Calcium: 9.5 mg/dL (ref 8.7–10.3)
Chloride: 102 mmol/L (ref 96–106)
Creatinine, Ser: 1.12 mg/dL — ABNORMAL HIGH (ref 0.57–1.00)
Glucose: 102 mg/dL — ABNORMAL HIGH (ref 70–99)
Potassium: 4.5 mmol/L (ref 3.5–5.2)
Sodium: 140 mmol/L (ref 134–144)
eGFR: 55 mL/min/{1.73_m2} — ABNORMAL LOW (ref >59)

## 2022-03-29 NOTE — Progress Notes (Signed)
Let patient know that her kidney function is not at 100%.  Make sure that she is drinking adequate fluids during the day.  Do not take any over-the-counter anti-inflammatory medications like ibuprofen, Aleve, Advil, Motrin as these can make kidney function worse.

## 2022-04-03 ENCOUNTER — Ambulatory Visit: Payer: Self-pay

## 2022-04-03 ENCOUNTER — Ambulatory Visit
Admission: RE | Admit: 2022-04-03 | Discharge: 2022-04-03 | Disposition: A | Discharge status: Home / Self Care | Payer: Medicaid Other | Source: Ambulatory Visit | Attending: Obstetrics and Gynecology | Admitting: Obstetrics and Gynecology

## 2022-04-03 DIAGNOSIS — Z1231 Encounter for screening mammogram for malignant neoplasm of breast: Secondary | ICD-10-CM

## 2022-04-03 NOTE — Addendum Note (Signed)
Addended by: Jonna Clark E on: 04/03/2022 11:09 AM   Modules accepted: Orders

## 2022-04-07 ENCOUNTER — Other Ambulatory Visit: Payer: Self-pay

## 2022-04-17 ENCOUNTER — Ambulatory Visit: Payer: Medicaid Other | Admitting: Internal Medicine

## 2022-04-28 ENCOUNTER — Ambulatory Visit: Payer: Self-pay | Attending: Internal Medicine | Admitting: Pharmacist

## 2022-04-28 ENCOUNTER — Other Ambulatory Visit: Payer: Self-pay

## 2022-04-28 DIAGNOSIS — E1165 Type 2 diabetes mellitus with hyperglycemia: Secondary | ICD-10-CM

## 2022-04-28 DIAGNOSIS — Z794 Long term (current) use of insulin: Secondary | ICD-10-CM

## 2022-04-28 LAB — POCT GLYCOSYLATED HEMOGLOBIN (HGB A1C): HbA1c, POC (controlled diabetic range): 9.5 % — AB (ref 0.0–7.0)

## 2022-04-28 MED ORDER — INSULIN LISPRO (1 UNIT DIAL) 100 UNIT/ML (KWIKPEN)
16.0000 [IU] | PEN_INJECTOR | Freq: Three times a day (TID) | SUBCUTANEOUS | 2 refills | Status: DC
  Filled 2022-04-28: qty 15, 31d supply, fill #0
  Filled 2022-07-20 – 2022-07-21 (×2): qty 15, 31d supply, fill #1

## 2022-04-28 MED ORDER — VICTOZA 18 MG/3ML ~~LOC~~ SOPN
1.2000 mg | PEN_INJECTOR | Freq: Every day | SUBCUTANEOUS | 2 refills | Status: DC
  Filled 2022-04-28 – 2022-05-02 (×2): qty 6, 30d supply, fill #0
  Filled 2022-07-20 – 2022-07-21 (×2): qty 6, 30d supply, fill #1
  Filled 2022-08-09: qty 6, 30d supply, fill #2

## 2022-04-28 NOTE — Progress Notes (Signed)
S:    Emily Farmer is a 66 y.o. female who presents for diabetes evaluation, education, and management. PMH is significant for HTN, PAF, T2DM, and HLD. Patient was referred by Primary Care Provider, Dr. Wynetta Emery, on 02/06/2022. Last seen by PCP on 03/27/2022. Last seen by pharmacy on 03/17/2022.  At last visit, patient was restarted on Victoza as she was unable to tolerate Trulicity due to diarrhea.   Today, patient arrives in good spirits and presents without any assistance. A1c further improved today, down to 9.5% from 10.7% in August 2023. Denies GI upset since restarting Victoza.   Family/Social History:  - FHx: diabetes (sister); heart attack (mom, dad) - Tobacco: never smoker - Alcohol: denies using alcohol  Current diabetes medications include: Basaglar 42 units QPM, Humalog 14 units TID AC, Jardiance 25 mg daily, Victoza 0.6 mg daily Previous diabetes medications: Trulicity 3 mg (intolerant d/t diarrhea)   Patient reports adherence to taking all medications as prescribed.   Insurance coverage: None  Patient denies hypoglycemic events.   Reported home fasting blood sugars: mostly 100s  Reported 2 hour post-meal/random blood sugars: mostly 200 to low 300s.  Patient reported dietary habits: Eats 3 meals/day. Largest meal either lunch or dinner Breakfast: oatmeal Lunch: Sandwich, hamburger Dinner: spaghetti, mac and cheese Drinks: water  O:  Brings her meter with her today.  7 day average blood glucose: 239 mg/dL 14 days: 244 mg//dL  30 days: 243 mg/dL   Lab Results  Component Value Date   HGBA1C 10.7 (A) 02/06/2022   Lipid Panel     Component Value Date/Time   CHOL 132 04/21/2021 0143   CHOL 214 (H) 06/05/2020 0837   TRIG 263 (H) 04/21/2021 0143   HDL 33 (L) 04/21/2021 0143   HDL 40 06/05/2020 0837   CHOLHDL 4.0 04/21/2021 0143   VLDL 53 (H) 04/21/2021 0143   LDLCALC 46 04/21/2021 0143   LDLCALC 110 (H) 06/05/2020 0837   LDLDIRECT 137 (H) 03/01/2020 1012    LDLDIRECT 96.0 09/23/2019 0828    Clinical Atherosclerotic Cardiovascular Disease (ASCVD): No  The 10-year ASCVD risk score (Arnett DK, et al., 2019) is: 10.8%   Values used to calculate the score:     Age: 23 years     Sex: Female     Is Non-Hispanic African American: No     Diabetic: Yes     Tobacco smoker: No     Systolic Blood Pressure: 540 mmHg     Is BP treated: Yes     HDL Cholesterol: 33 mg/dL     Total Cholesterol: 132 mg/dL   A/P: Diabetes longstanding currently not controlled, but improving based on A1c today (down to 9.5%) Patient is able to verbalize appropriate hypoglycemia management plan. Medication adherence appears appropriate. Control is suboptimal due to dietary habits. -Continued basal insulin Basaglar (insulin glargine) 42 units daily.  -Increased rapid insulin Humalog (insulin lispro) to 16 units TID AC. -Increased GLP-1 Victoza (liraglutide) to 1.2 mg daily.  -Continued SGLT2-I Jardiance (empagliflozin) 25 mg daily. Counseled on sick day rules. -Patient educated on purpose, proper use, and potential adverse effects of Victoza.  -Extensively discussed pathophysiology of diabetes, recommended lifestyle interventions, dietary effects on blood sugar control.  -Counseled on s/sx of and management of hypoglycemia.  -Next A1c February 2024.   Total time in face to face counseling 25 minutes.    Follow-up:  Pharmacist 4 weeks. PCP clinic visit on 03/27/2022.   Joseph Art, Pharm.D. PGY-2 Ambulatory Care Pharmacy Resident  04/28/2022 8:42 AM

## 2022-05-01 ENCOUNTER — Other Ambulatory Visit: Payer: Self-pay

## 2022-05-02 ENCOUNTER — Other Ambulatory Visit: Payer: Self-pay

## 2022-05-06 ENCOUNTER — Other Ambulatory Visit: Payer: Self-pay

## 2022-05-16 ENCOUNTER — Ambulatory Visit: Payer: Medicaid Other | Admitting: Internal Medicine

## 2022-05-16 NOTE — Progress Notes (Deleted)
   Name: Emily Farmer  Age/ Sex: 66 y.o., female   MRN/ DOB: [id number], 12/05/1955     PCP: Johnson, Deborah B, MD   Reason for Endocrinology Evaluation: Type 2 Diabetes Mellitus  Initial Endocrine Consultative Visit: 09/26/2019    PATIENT IDENTIFIER: Emily Farmer is a 66 y.o. female with a past medical history of DM, HTN, A.Fib  (S/P ICD 02/2022)and dyslipidemia. The patient has followed with Endocrinology clinic since 09/26/2019 for consultative assistance with management of her diabetes.  DIABETIC HISTORY:  Emily Farmer was diagnosed with DM in 2013, has been on insulin and metformin since diagnosis. Her hemoglobin A1c has ranged from 9.5% in 2020, peaking at 12.2% in 2019    On initial visit to our clinic, her A1c was 12.4% . 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  She checks her blood sugars occasionally . The patient has not had hypoglycemic episodes since the last clinic visit.    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 09/2016 in setting of pancreatitis;  b. 09/2016 Echo: EF  65-60%, no rwma, Gr2 DD, mild MR, triv TR, PASP 30mmHg;  CHA2DS2VASc = 4-->Eliquis 5mg BID. c. Recurred 06/2018 in setting of GIB.   Chronic diastolic CHF (congestive heart failure) (HCC) 01/28/2018   Diabetes mellitus    GI bleeding 06/2018   Gout    Hyperlipidemia    Hypertension    Hypertriglyceridemia    Pancreatitis    a. 09/2016 - Triglycerides 1,392 on admission.   Past Surgical History:  Past Surgical History:  Procedure Laterality Date   BIOPSY  06/20/2018   Procedure: BIOPSY;  Surgeon: Magod, Marc, MD;  Location: [facility];  Service: Endoscopy;;   CHOLECYSTECTOMY N/A 01/04/2014   Procedure: LAPAROSCOPIC CHOLECYSTECTOMY WITH INTRAOPERATIVE CHOLANGIOGRAM;  Surgeon: Armando Ramirez, MD;  Location: [facility] OR;  Service: General;  Laterality: N/A;   COLONOSCOPY WITH PROPOFOL N/A 06/21/2018   Procedure: COLONOSCOPY WITH PROPOFOL;  Surgeon: Buccini, Robert, MD;  Location: [facility];  Service: Endoscopy;  Laterality: N/A;   ESOPHAGOGASTRODUODENOSCOPY (EGD) WITH PROPOFOL N/A 06/20/2018   Procedure: ESOPHAGOGASTRODUODENOSCOPY (EGD) WITH PROPOFOL;  Surgeon: Magod, Marc, MD;  Location: [facility];  Service: Endoscopy;  Laterality: N/A;   LUNG SURGERY     PACEMAKER IMPLANT N/A 03/03/2022   Procedure: PACEMAKER IMPLANT;  Surgeon: Taylor, Gregg W, MD;  Location: [facility];  Service: Cardiovascular;  Laterality: N/A;   Social History:  reports that she has never smoked. She has never used smokeless   tobacco. She reports that she does not drink alcohol and does not use drugs. Family History:  Family History  Problem Relation Age of Onset   Heart attack Mother    Heart attack Father    Diabetes Sister    High blood pressure Neg Hx    High Cholesterol Neg Hx      HOME MEDICATIONS: Allergies as of 05/16/2022       Reactions   Trulicity [dulaglutide] Diarrhea        Medication List        Accurate as of May 16, 2022  7:25 AM. If you have any questions, ask your nurse or  doctor.          acetaminophen 325 MG tablet Commonly known as: TYLENOL Take 2 tablets (650 mg total) by mouth every 6 (six) hours as needed for mild pain (or Fever >/= 101).   apixaban 5 MG Tabs tablet Commonly known as: ELIQUIS Take 1 tablet (5 mg total) by mouth 2 (two) times daily. Resume taking on 9/24 with evening dose.   atorvastatin 40 MG tablet Commonly known as: LIPITOR Take 1 tablet (40 mg total) by mouth daily.   Basaglar KwikPen 100 UNIT/ML Inject 40 Units into the skin at bedtime. What changed: how much to take   Blood Pressure Monitor/Arm Devi Check blood pressure once per day at least 2 hours after medications.   CENTRUM SILVER PO Take 1 tablet by mouth daily.   Dexcom G6 Sensor Misc 1 Device by Does not apply route as directed.   Dexcom G6 Transmitter Misc 1 Device by Does not apply route as directed.   diclofenac Sodium 1 % Gel Commonly known as: Voltaren Apply 2 g topically 4 (four) times daily. Apply across lower back for back pain What changed:  when to take this reasons to take this additional instructions   digoxin 0.125 MG tablet Commonly known as: LANOXIN Take 1 tablet (0.125 mg total) by mouth daily.   ezetimibe 10 MG tablet Commonly known as: Zetia Take 1 tablet (10 mg total) by mouth daily.   FeroSul 325 (65 FE) MG tablet Generic drug: ferrous sulfate Take 1 tablet (325 mg total) by mouth daily with breakfast.   glucose blood test strip USE AS INSTRUCTED TO CHECK BLOOD SUGARS 3 TIMES DAILY What changed:  how much to take how to take this when to take this additional instructions   HumaLOG KwikPen 100 UNIT/ML KwikPen Generic drug: insulin lispro Inject 16 Units into the skin 3 (three) times daily.   Insulin Syringe-Needle U-100 31G X 5/16" 0.3 ML Misc Commonly known as: TRUEplus Insulin Syringe Use to inject insulin daily. What changed:  how much to take how to take this when to take this   Jardiance 25 MG Tabs  tablet Generic drug: empagliflozin Take 1 tablet (25 mg total) by mouth daily before breakfast.   metoprolol succinate 100 MG 24 hr tablet Commonly known as: TOPROL-XL Take 1 tablet (100 mg total) by mouth daily. Take with or immediately following a meal.   TechLite Pen Needles 32G X 4 MM Misc Generic drug: Insulin Pen Needle use as directed in the morning, at noon, in the evening, and at bedtime.   True Metrix Meter w/Device Kit Use to monitor blood sugar as directed What changed:  how much to take how to take this when to take this   Victoza 18 MG/3ML Sopn Generic drug: liraglutide Inject 1.2 mg into the skin daily.  OBJECTIVE:   Vital Signs: There were no vitals taken for this visit.  Wt Readings from Last 3 Encounters:  03/27/22 151 lb 12.8 oz (68.9 kg)  03/17/22 148 lb (67.1 kg)  03/04/22 145 lb 3.2 oz (65.9 kg)     Exam: General: Pt appears well and is in NAD  Lungs: Clear with good BS bilat with no rales, rhonchi, or wheezes  Heart: RRR with normal S1 and S2 and no gallops; no murmurs; no rub  Extremities: No pretibial edema.   Neuro: MS is good with appropriate affect, pt is alert and Ox3   DM foot exam: 05/16/2021   The skin of the feet is intact without sores or ulcerations, but has thickened and discolored toe nails The pedal pulses are 2+ on right and 2+ on left. The sensation is decreased to a screening 5.07, 10 gram monofilament bilaterally  DATA REVIEWED:  Lab Results  Component Value Date   HGBA1C 9.5 (A) 04/28/2022   HGBA1C 10.7 (A) 02/06/2022   HGBA1C 14.1 (A) 10/17/2021     Latest Reference Range & Units 03/27/22 11:15  Sodium 134 - 144 mmol/L 140  Potassium 3.5 - 5.2 mmol/L 4.5  Chloride 96 - 106 mmol/L 102  CO2 20 - 29 mmol/L 22  Glucose 70 - 99 mg/dL 102 (H)  BUN 8 - 27 mg/dL 48 (H)  Creatinine 0.57 - 1.00 mg/dL 1.12 (H)  Calcium 8.7 - 10.3 mg/dL 9.5  BUN/Creatinine Ratio 12 - 28  43 (H)  eGFR >59 mL/min/1.73 55 (L)   (H): Data is abnormally high (L): Data is abnormally low   ASSESSMENT / PLAN / RECOMMENDATIONS:   1) Type 2 Diabetes Mellitus, Poorly controlled, With neuropathic complications - Most recent A1c of 9.5 %. Goal A1c < 7.0 %.    -A1c down from 14.1% to 9.5%, but it remains above goal -Today she tells me she takes Jardiance twice a day and she needs a refill? -She also tells me she has been taking insulin doses that are less than previously prescribed in December -She just saw her PCP recently, and Victoza was switched to Trulicity, and her insulin regimen has increased -I will increase Jardiance -In office BG 166 mg/DL, this is fasting  MEDICATIONS:  -Increase Jardiace 25 mg daily  -Continue Basaglar 38 units daily  -Increase Humalog 12 units with EACH meal  - Correction scale : HUmalog (BG -130/30)  -Continue Trulicity 1.5 mg weekly    EDUCATION / INSTRUCTIONS: BG monitoring instructions: Patient is instructed to check her blood sugars 3 times a day, before meals. Call [facility] if: BG persistently < 70 or > 300. I reviewed the Rule of 15 for the treatment of hypoglycemia in detail with the patient. Literature supplied.    2) Diabetic complications:  Eye: Does not have known diabetic retinopathy.  Neuro/ Feet: Does have known diabetic peripheral neuropathy. Renal: Patient does have known baseline CKD. She is not on an ACEI/ARB at present.     F/U in 4 months   Signed electronically by: Abby Jaralla Caly Pellum, MD  [facility]  [facility] [address] [place], [place] [address] Phone: [phone] FAX: [phone]   CC: Johnson, Deborah B, MD [address] [place] [address] Phone: [phone]  Fax: [phone]  Return to Endocrinology clinic as below: Future Appointments  Date Time Provider Department Center  05/16/2022 10:10 AM Daritza Brees Jaralla, MD [facility] None   06/03/2022  7:05   AM [facility] DEVICE REMOTES [facility]  06/03/2022  2:30 PM Taylor, Gregg W, MD [facility]  06/05/2022  9:00 AM Van Ausdall, Stephen L, RPH-CPP [facility] None  06/19/2022 10:30 AM Johnson, Deborah B, MD [facility] None  09/02/2022  7:05 AM [facility] DEVICE REMOTES [facility]  12/02/2022  7:05 AM [facility] DEVICE REMOTES [facility]  03/03/2023  7:05 AM [facility] DEVICE REMOTES [facility]  06/02/2023  7:05 AM [facility] DEVICE REMOTES [facility]  09/01/2023  7:05 AM [facility] DEVICE REMOTES [facility]    

## 2022-05-22 ENCOUNTER — Ambulatory Visit: Payer: Medicaid Other | Admitting: Internal Medicine

## 2022-05-23 ENCOUNTER — Other Ambulatory Visit: Payer: Self-pay

## 2022-05-27 ENCOUNTER — Other Ambulatory Visit: Payer: Self-pay

## 2022-05-28 ENCOUNTER — Telehealth: Payer: Self-pay | Admitting: Emergency Medicine

## 2022-05-28 ENCOUNTER — Other Ambulatory Visit: Payer: Self-pay

## 2022-05-28 NOTE — Telephone Encounter (Signed)
Copied from CRM [id number]. Topic: General - Other >> May 28, 2022  3:31 PM Esperanza Sheets wrote: Pt states she took her last dose of eliquis and inquiring if she can pick up more samples tomorrow 12-14  Please fu w/ pt  Please call  [phone] if pt is unavailable at number listed

## 2022-05-28 NOTE — Telephone Encounter (Signed)
Patient must follow-up with her Cardiologist for Eliquis. We do not keep samples in our office.

## 2022-05-29 ENCOUNTER — Other Ambulatory Visit: Payer: Self-pay

## 2022-06-02 ENCOUNTER — Other Ambulatory Visit: Payer: Self-pay

## 2022-06-03 ENCOUNTER — Encounter: Payer: Self-pay | Admitting: Internal Medicine

## 2022-06-03 ENCOUNTER — Other Ambulatory Visit: Payer: Self-pay

## 2022-06-03 ENCOUNTER — Ambulatory Visit: Payer: Medicare Other | Attending: Internal Medicine | Admitting: Internal Medicine

## 2022-06-03 VITALS — BP 118/60 | HR 73 | Ht 63.0 in | Wt 147.0 lb

## 2022-06-03 DIAGNOSIS — Z950 Presence of cardiac pacemaker: Secondary | ICD-10-CM | POA: Insufficient documentation

## 2022-06-03 DIAGNOSIS — I5032 Chronic diastolic (congestive) heart failure: Secondary | ICD-10-CM | POA: Diagnosis present

## 2022-06-03 DIAGNOSIS — I480 Paroxysmal atrial fibrillation: Secondary | ICD-10-CM | POA: Insufficient documentation

## 2022-06-03 LAB — CUP PACEART INCLINIC DEVICE CHECK
Battery Remaining Longevity: 189 mo
Battery Voltage: 3.22 V
Brady Statistic RV Percent Paced: 4.25 %
Date Time Interrogation Session: 20231219144611
Implantable Lead Connection Status: 753985
Implantable Lead Implant Date: 20230918
Implantable Lead Location: 753860
Implantable Lead Model: 3830
Implantable Pulse Generator Implant Date: 20230918
Lead Channel Impedance Value: 437 Ohm
Lead Channel Impedance Value: 608 Ohm
Lead Channel Pacing Threshold Amplitude: 0.5 V
Lead Channel Pacing Threshold Pulse Width: 0.4 ms
Lead Channel Sensing Intrinsic Amplitude: 16.875 mV
Lead Channel Sensing Intrinsic Amplitude: 18.25 mV
Lead Channel Setting Pacing Amplitude: 2.5 V
Lead Channel Setting Pacing Pulse Width: 0.4 ms
Lead Channel Setting Sensing Sensitivity: 1.2 mV
Zone Setting Status: 755011

## 2022-06-03 MED ORDER — DILTIAZEM HCL ER COATED BEADS 120 MG PO CP24
120.0000 mg | ORAL_CAPSULE | Freq: Every day | ORAL | 5 refills | Status: DC
  Filled 2022-06-03: qty 30, 30d supply, fill #0
  Filled 2022-07-02: qty 30, 30d supply, fill #1
  Filled 2022-08-09: qty 30, 30d supply, fill #2
  Filled 2022-09-12: qty 30, 30d supply, fill #3
  Filled 2022-10-20: qty 30, 30d supply, fill #4
  Filled 2022-11-21: qty 30, 30d supply, fill #5
  Filled 2023-01-26: qty 30, 30d supply, fill #6

## 2022-06-03 NOTE — Patient Instructions (Addendum)
Medication Instructions:  Your physician has recommended you make the following change in your medication:  Start Taking: Cardizem 120 mg, Daily  Cardizem 120 mg,  You will-  Take 1 capsule (120 mg total) by mouth daily    Lab Work: None ordered.  If you have labs (blood work) drawn today and your tests are completely normal, you will receive your results only by: MyChart Message (if you have MyChart) OR A paper copy in the mail If you have any lab test that is abnormal or we need to change your treatment, we will call you to review the results.  Testing/Procedures: None ordered.  Follow-Up: At [facility], you and your health needs are our priority.  As part of our continuing mission to provide you with exceptional heart care, we have created designated Provider Care Teams.  These Care Teams include your primary Cardiologist (physician) and Advanced Practice Providers (APPs -  Physician Assistants and Nurse Practitioners) who all work together to provide you with the care you need, when you need it.  We recommend signing up for the patient portal called "MyChart".  Sign up information is provided on this After Visit Summary.  MyChart is used to connect with patients for Virtual Visits (Telemedicine).  Patients are able to view lab/test results, encounter notes, upcoming appointments, etc.  Non-urgent messages can be sent to your provider as well.   To learn more about what you can do with MyChart, go to [email or web address].    Your next appointment:   Please schedule a 3 month follow up appointment with Dr. Lewayne Bunting.   The format for your next appointment:   In Person  Provider:   Lewayne Bunting, MD{or one of the following Advanced Practice Providers on your designated Care Team:   Francis Dowse, [place] Casimiro Needle "Mardelle Matte" Lanna Poche, [place]  Remote monitoring is used to monitor your Pacemaker from home. This monitoring reduces the number of office visits required to check your  device to one time per year. It allows [place] to keep an eye on the functioning of your device to ensure it is working properly. You are scheduled for a device check from home on 09/02/22. You may send your transmission at any time that day. If you have a wireless device, the transmission will be sent automatically. After your physician reviews your transmission, you will receive a postcard with your next transmission date.  Diltiazem Tablets What is this medication? DILTIAZEM (dil TYE a zem) treats high blood pressure and prevents chest pain (angina). It works by relaxing the blood vessels, which helps decrease the amount of work your heart has to do. It belongs to a group of medications called calcium channel blockers. This medicine may be used for other purposes; ask your health care provider or pharmacist if you have questions. COMMON BRAND NAME(S): Cardizem What should I tell my care team before I take this medication? They need to know if you have any of these conditions: Heart attack Heart disease Irregular heartbeat or rhythm Low blood pressure An unusual or allergic reaction to diltiazem, other medications, foods, dyes, or preservatives Pregnant or trying to get pregnant Breast-feeding How should I use this medication? Take this medication by mouth. Take it as directed on the prescription label at the same time every day. Keep taking it unless your care team tells you to stop. Talk to your care team about the use of this medication in children. Special care may be needed. Overdosage: If you think you have taken  too much of this medicine contact a poison control center or emergency room at once. NOTE: This medicine is only for you. Do not share this medicine with others. What if I miss a dose? If you miss a dose, take it as soon as you can. If it is almost time for your next dose, take only that dose. Do not take double or extra doses. What may interact with this medication? Do not take  this medication with any of the following: Cisapride [place] Pimozide Ranolazine Red yeast rice This medication may also interact with the following: Buspirone Carbamazepine Cimetidine Cyclosporine Digoxin Local anesthetics or general anesthetics Lovastatin Medications for anxiety or difficulty sleeping like midazolam and triazolam Medications for high blood pressure or heart problems Quinidine Rifampin, rifabutin, or rifapentine This list may not describe all possible interactions. Give your health care provider a list of all the medicines, herbs, non-prescription drugs, or dietary supplements you use. Also tell them if you smoke, drink alcohol, or use illegal drugs. Some items may interact with your medicine. What should I watch for while using this medication? Visit your care team for regular checks on your progress. Check your blood pressure as directed. Ask your care team what your blood pressure should be. Also, find out when you should contact them. Do not treat yourself for coughs, colds, or pain while you are using this medication without asking your care team for advice. Some medications may increase your blood pressure. This medication may cause serious skin reactions. They can happen weeks to months after starting the medication. Contact your care team right away if you notice fevers or flu-like symptoms with a rash. The rash may be red or purple and then turn into blisters or peeling of the skin. Or, you might notice a red rash with swelling of the face, lips or lymph nodes in your neck or under your arms. You may get drowsy or dizzy. Do not drive, use machinery, or do anything that needs mental alertness until you know how this medication affects you. Do not stand up or sit up quickly, especially if you are an older patient. This reduces the risk of dizzy or fainting spells. What side effects may I notice from receiving this medication? Side effects that you should report to  your care team as soon as possible: Allergic reactions--skin rash, itching, hives, swelling of the face, lips, tongue, or throat Heart failure--shortness of breath, swelling of the ankles, feet, or hands, sudden weight gain, unusual weakness or fatigue Slow heart beat--dizziness, feeling faint or lightheaded, trouble breathing, unusual weakness or fatigue Liver injury--right upper belly pain, loss of appetite, nausea, light-colored stool, dark yellow or brown urine, yellowing skin or eyes, unusual weakness or fatigue Low blood pressure--dizziness, feeling faint or lightheaded, blurry vision Redness, blistering, peeling, or loosening of the skin, including inside the mouth Side effects that usually do not require medical attention (report to your care team if they continue or are bothersome): Constipation Facial flushing, redness Headache This list may not describe all possible side effects. Call your doctor for medical advice about side effects. You may report side effects to FDA at 1-800-FDA-1088. Where should I keep my medication? Keep out of the reach of children and pets. Store at room temperature between 15 and 30 degrees C (59 and 86 degrees F). Protect from moisture. Keep the container tightly closed. Throw away any unused medication after the expiration date. NOTE: This sheet is a summary. It may not cover all possible information.  If you have questions about this medicine, talk to your doctor, pharmacist, or health care provider.  2023 Elsevier/Gold Standard (2020-05-21 00:00:00)

## 2022-06-03 NOTE — Progress Notes (Signed)
HPI Emily Farmer returns today for ongoing evaluation of atrial fib with tachy-brady syndrome. She is a pleasant 66 yo woman with uncontrolled atrial fib and is s/p VVI PM insertion about 3 months ago. She has been on both beta blocker and digoxin but continues to have RVR. She has not yet taken cardizem. She denies chest pain but she quickly gets out of breath with any walking.  Allergies  Allergen Reactions   Trulicity [Dulaglutide] Diarrhea     Current Outpatient Medications  Medication Sig Dispense Refill   acetaminophen (TYLENOL) 325 MG tablet Take 2 tablets (650 mg total) by mouth every 6 (six) hours as needed for mild pain (or Fever >/= 101).     apixaban (ELIQUIS) 5 MG TABS tablet Take 1 tablet (5 mg total) by mouth 2 (two) times daily. Resume taking on 9/24 with evening dose. 60 tablet 5   atorvastatin (LIPITOR) 40 MG tablet Take 1 tablet (40 mg total) by mouth daily. 90 tablet 3   Blood Glucose Monitoring Suppl (TRUE METRIX METER) w/Device KIT Use to monitor blood sugar as directed (Patient taking differently: 1 each by Other route See admin instructions. Use to monitor blood sugar as directed) 1 kit 0   Blood Pressure Monitoring (BLOOD PRESSURE MONITOR/ARM) DEVI Check blood pressure once per day at least 2 hours after medications. 1 each 0   Continuous Blood Gluc Sensor (DEXCOM G6 SENSOR) MISC 1 Device by Does not apply route as directed. 3 each 11   Continuous Blood Gluc Transmit (DEXCOM G6 TRANSMITTER) MISC 1 Device by Does not apply route as directed. 1 each 3   diclofenac Sodium (VOLTAREN) 1 % GEL Apply 2 g topically 4 (four) times daily. Apply across lower back for back pain (Patient taking differently: Apply 2 g topically 4 (four) times daily as needed (back pain).) 100 g 0   digoxin (LANOXIN) 0.125 MG tablet Take 1 tablet (0.125 mg total) by mouth daily. 90 tablet 3   empagliflozin (JARDIANCE) 25 MG TABS tablet Take 1 tablet (25 mg total) by mouth daily before breakfast. 90  tablet 2   ezetimibe (ZETIA) 10 MG tablet Take 1 tablet (10 mg total) by mouth daily. 90 tablet 3   glucose blood test strip USE AS INSTRUCTED TO CHECK BLOOD SUGARS 3 TIMES DAILY (Patient taking differently: 1 each by Other route See admin instructions. Check blood sugar 4 times daily) 100 each 11   Insulin Glargine (BASAGLAR KWIKPEN) 100 UNIT/ML Inject 40 Units into the skin at bedtime. (Patient taking differently: Inject 42 Units into the skin at bedtime.) 15 mL 6   insulin lispro (HUMALOG KWIKPEN) 100 UNIT/ML KwikPen Inject 16 Units into the skin 3 (three) times daily. 15 mL 2   Insulin Pen Needle 32G X 4 MM MISC use as directed in the morning, at noon, in the evening, and at bedtime. 400 each 3   Insulin Syringe-Needle U-100 (TRUEPLUS INSULIN SYRINGE) 31G X 5/16" 0.3 ML MISC Use to inject insulin daily. (Patient taking differently: 1 each by Other route See admin instructions. Use to inject insulin daily.) 100 each 11   liraglutide (VICTOZA) 18 MG/3ML SOPN Inject 1.2 mg into the skin daily. 6 mL 2   metoprolol succinate (TOPROL-XL) 100 MG 24 hr tablet Take 1 tablet (100 mg total) by mouth daily. Take with or immediately following a meal. 90 tablet 6   Multiple Vitamins-Minerals (CENTRUM SILVER PO) Take 1 tablet by mouth daily.     ferrous  sulfate 325 (65 FE) MG tablet Take 1 tablet (325 mg total) by mouth daily with breakfast. 30 tablet 4   No current facility-administered medications for this visit.     Past Medical History:  Diagnosis Date   Arthritis    Atrial fibrillation ([place])    a. 09/2016 in setting of pancreatitis;  b. 09/2016 Echo: EF 65-60%, no rwma, Gr2 DD, mild MR, triv TR, PASP 45mHg;  CHA2DS2VASc = 4-->Eliquis 549mBID. c. Recurred 06/2018 in setting of GIB.   Chronic diastolic CHF (congestive heart failure) ([place]8/15/2019   Diabetes mellitus    GI bleeding 06/2018   Gout    Hyperlipidemia    Hypertension    Hypertriglyceridemia    Pancreatitis    a. 09/2016 -  Triglycerides 1,392 on admission.    ROS:   All systems reviewed and negative except as noted in the HPI.   Past Surgical History:  Procedure Laterality Date   BIOPSY  06/20/2018   Procedure: BIOPSY;  Surgeon: MaClarene EssexMD;  [facility] Service: Endoscopy;;   CHOLECYSTECTOMY N/A 01/04/2014   Procedure: LAPAROSCOPIC CHOLECYSTECTOMY WITH INTRAOPERATIVE CHOLANGIOGRAM;  Surgeon: ArRalene OkMD;  [facility]: [facility] Service: General;  Laterality: N/A;   COLONOSCOPY WITH PROPOFOL N/A 06/21/2018   Procedure: COLONOSCOPY WITH PROPOFOL;  Surgeon: BuRonald LoboMD;  [facility] Service: Endoscopy;  Laterality: N/A;   ESOPHAGOGASTRODUODENOSCOPY (EGD) WITH PROPOFOL N/A 06/20/2018   Procedure: ESOPHAGOGASTRODUODENOSCOPY (EGD) WITH PROPOFOL;  Surgeon: MaClarene EssexMD;  [facility]: [facility] Service: Endoscopy;  Laterality: N/A;   LUNG SURGERY     PACEMAKER IMPLANT N/A 03/03/2022   Procedure: PACEMAKER IMPLANT;  Surgeon: TaEvans LanceMD;  [facility];  Service: Cardiovascular;  Laterality: N/A;     Family History  Problem Relation Age of Onset   Heart attack Mother    Heart attack Father    Diabetes Sister    High blood pressure Neg Hx    High Cholesterol Neg Hx      Social History   Socioeconomic History   Marital status: Single    Spouse name: Not on file   Number of children: Not on file   Years of education: Not on file   Highest education level: Not on file  Occupational History   Not on file  Tobacco Use   Smoking status: Never   Smokeless tobacco: Never  Vaping Use   Vaping Use: Never used  Substance and Sexual Activity   Alcohol use: No   Drug use: No   Sexual activity: Not Currently  Other Topics Concern   Not on file  Social History Narrative   Lives with her sister and does not need any assistance with ADLs.     Social Determinants of Health   Financial Resource Strain: Not on file  Food Insecurity: Not on  file  Transportation Needs: Not on file  Physical Activity: Not on file  Stress: Not on file  Social Connections: Not on file  Intimate Partner Violence: Not on file     BP 118/60   Pulse 73   Ht _0  (1.6 m)   Wt 147 lb (66.7 kg)   SpO2 98%   BMI 26.04 kg/m   Physical Exam:  Well appearing NAD HEENT: Unremarkable Neck:  No JVD, no thyromegally Lymphatics:  No adenopathy Back:  No CVA tenderness Lungs:  Clear with basilar rales. HEART:  IRegular rate rhythm, no murmurs, no rubs, no  clicks Abd:  soft, positive bowel sounds, no organomegally, no rebound, no guarding Ext:  2 plus pulses, no edema, no cyanosis, no clubbing Skin:  No rashes no nodules Neuro:  CN II through XII intact, motor grossly intact  EKG - atrial fib with a RVR  DEVICE  Normal device function.  See PaceArt for details.   Assess/Plan: Uncontrolled atrial fib. I have discussed the treatment options with the patient and we will try to slow her down with the addition of cardizem. If her VR is still fast in a couple of weeks, then AV node ablation would be warranted. Chronic diastolic heart failure - her symptoms are class 3 with her RVR. PPM -her single chamber medtronic PPM is working normally. Coags - she has not had any bleeding. We will follow.  Carleene Overlie [place]a Gentz,MD

## 2022-06-04 ENCOUNTER — Other Ambulatory Visit: Payer: Self-pay

## 2022-06-05 ENCOUNTER — Ambulatory Visit: Payer: Medicare Other | Attending: Internal Medicine | Admitting: Pharmacist

## 2022-06-05 ENCOUNTER — Other Ambulatory Visit: Payer: Self-pay

## 2022-06-05 DIAGNOSIS — Z713 Dietary counseling and surveillance: Secondary | ICD-10-CM | POA: Insufficient documentation

## 2022-06-05 DIAGNOSIS — E785 Hyperlipidemia, unspecified: Secondary | ICD-10-CM | POA: Insufficient documentation

## 2022-06-05 DIAGNOSIS — Z794 Long term (current) use of insulin: Secondary | ICD-10-CM

## 2022-06-05 DIAGNOSIS — E1165 Type 2 diabetes mellitus with hyperglycemia: Secondary | ICD-10-CM | POA: Diagnosis not present

## 2022-06-05 DIAGNOSIS — I10 Essential (primary) hypertension: Secondary | ICD-10-CM | POA: Diagnosis not present

## 2022-06-05 DIAGNOSIS — I480 Paroxysmal atrial fibrillation: Secondary | ICD-10-CM | POA: Insufficient documentation

## 2022-06-05 DIAGNOSIS — E119 Type 2 diabetes mellitus without complications: Secondary | ICD-10-CM | POA: Diagnosis present

## 2022-06-05 MED ORDER — BASAGLAR KWIKPEN 100 UNIT/ML ~~LOC~~ SOPN
48.0000 [IU] | PEN_INJECTOR | Freq: Every day | SUBCUTANEOUS | 6 refills | Status: DC
  Filled 2022-06-05: qty 15, 31d supply, fill #0

## 2022-06-05 NOTE — Progress Notes (Signed)
    S:    Emily Farmer is a 66 y.o. female who presents for diabetes evaluation, education, and management. PMH is significant for HTN, PAF, T2DM, and HLD. Patient was referred by Primary Care Provider, Dr. Wynetta Emery, on 03/27/2022. We saw her on 04/28/2022 and increased her Humalog dose.  Today, patient arrives in good spirits and presents without any assistance. Denies GI upset since adjusting to the 1.8 mg dose of Victoza. She could not tolerate Trulicity d/t diarrhea. Tells me she held all medications last week for several days d/t a cold. She has since restarted them Monday. She has taken everything as prescribed today.   Family/Social History:  - FHx: diabetes (sister); heart attack (mom, dad) - Tobacco: never smoker - Alcohol: denies using alcohol  Current diabetes medications include: Basaglar 42 units QPM, Humalog 16 units TID AC, Jardiance 25 mg daily, Victoza 1.8 mg daily Previous diabetes medications: Trulicity 3 mg (intolerant d/t diarrhea)   Patient reports adherence to taking all medications as prescribed.   Insurance coverage: None  Patient denies hypoglycemic events.   Patient reported dietary habits: Eats 3 meals/day. Largest meal either lunch or dinner Breakfast: oatmeal Lunch: Sandwich, hamburger Dinner: spaghetti, mac and cheese Drinks: water  O:  Brings her meter with her today.  7 day average blood glucose: 348 mg/dL 14 days: 375 mg/dL  30 days: 265 mg/dL   Lab Results  Component Value Date   HGBA1C 9.5 (A) 04/28/2022   Lipid Panel     Component Value Date/Time   CHOL 132 04/21/2021 0143   CHOL 214 (H) 06/05/2020 0837   TRIG 263 (H) 04/21/2021 0143   HDL 33 (L) 04/21/2021 0143   HDL 40 06/05/2020 0837   CHOLHDL 4.0 04/21/2021 0143   VLDL 53 (H) 04/21/2021 0143   LDLCALC 46 04/21/2021 0143   LDLCALC 110 (H) 06/05/2020 0837   LDLDIRECT 137 (H) 03/01/2020 1012   LDLDIRECT 96.0 09/23/2019 0828    Clinical Atherosclerotic Cardiovascular Disease  (ASCVD): No  The 10-year ASCVD risk score (Arnett DK, et al., 2019) is: 11.9%   Values used to calculate the score:     Age: 58 years     Sex: Female     Is Non-Hispanic African American: No     Diabetic: Yes     Tobacco smoker: No     Systolic Blood Pressure: 650 mmHg     Is BP treated: Yes     HDL Cholesterol: 33 mg/dL     Total Cholesterol: 132 mg/dL   A/P: Diabetes longstanding currently not controlled. Patient is able to verbalize appropriate hypoglycemia management plan. Medication adherence appears appropriate. Control is suboptimal due to dietary habits. -Increase basal insulin Basaglar (insulin glargine) to 48 units daily.  -Continue rapid insulin Humalog (insulin lispro) to 16 units TID AC. -Continue GLP-1 Victoza (liraglutide) 1.8 mg daily.  -Continued SGLT2-I Jardiance (empagliflozin) 25 mg daily. Counseled on sick day rules. -Patient educated on purpose, proper use, and potential adverse effects of Victoza.  -Extensively discussed pathophysiology of diabetes, recommended lifestyle interventions, dietary effects on blood sugar control.  -Counseled on s/sx of and management of hypoglycemia.  -Next A1c February 2024.   Total time in face to face counseling 25 minutes.    Follow-up:  PCP 06/19/2022.  Benard Halsted, PharmD, [place], [place] [phone]

## 2022-06-06 ENCOUNTER — Other Ambulatory Visit: Payer: Self-pay

## 2022-06-17 ENCOUNTER — Other Ambulatory Visit: Payer: Self-pay

## 2022-06-19 ENCOUNTER — Ambulatory Visit: Payer: Medicare Other | Attending: Internal Medicine | Admitting: Internal Medicine

## 2022-06-19 ENCOUNTER — Other Ambulatory Visit: Payer: Self-pay

## 2022-06-19 ENCOUNTER — Encounter: Payer: Self-pay | Admitting: Internal Medicine

## 2022-06-19 DIAGNOSIS — Z7901 Long term (current) use of anticoagulants: Secondary | ICD-10-CM | POA: Diagnosis not present

## 2022-06-19 DIAGNOSIS — Z7984 Long term (current) use of oral hypoglycemic drugs: Secondary | ICD-10-CM | POA: Insufficient documentation

## 2022-06-19 DIAGNOSIS — I5032 Chronic diastolic (congestive) heart failure: Secondary | ICD-10-CM | POA: Insufficient documentation

## 2022-06-19 DIAGNOSIS — I480 Paroxysmal atrial fibrillation: Secondary | ICD-10-CM | POA: Diagnosis not present

## 2022-06-19 DIAGNOSIS — E1165 Type 2 diabetes mellitus with hyperglycemia: Secondary | ICD-10-CM | POA: Diagnosis present

## 2022-06-19 DIAGNOSIS — E876 Hypokalemia: Secondary | ICD-10-CM | POA: Insufficient documentation

## 2022-06-19 DIAGNOSIS — D500 Iron deficiency anemia secondary to blood loss (chronic): Secondary | ICD-10-CM | POA: Diagnosis not present

## 2022-06-19 DIAGNOSIS — I959 Hypotension, unspecified: Secondary | ICD-10-CM | POA: Insufficient documentation

## 2022-06-19 DIAGNOSIS — Z6826 Body mass index (BMI) 26.0-26.9, adult: Secondary | ICD-10-CM | POA: Diagnosis not present

## 2022-06-19 DIAGNOSIS — D6869 Other thrombophilia: Secondary | ICD-10-CM | POA: Diagnosis not present

## 2022-06-19 DIAGNOSIS — Z8719 Personal history of other diseases of the digestive system: Secondary | ICD-10-CM | POA: Diagnosis not present

## 2022-06-19 DIAGNOSIS — I110 Hypertensive heart disease with heart failure: Secondary | ICD-10-CM | POA: Insufficient documentation

## 2022-06-19 DIAGNOSIS — I2510 Atherosclerotic heart disease of native coronary artery without angina pectoris: Secondary | ICD-10-CM | POA: Insufficient documentation

## 2022-06-19 DIAGNOSIS — Z79899 Other long term (current) drug therapy: Secondary | ICD-10-CM | POA: Insufficient documentation

## 2022-06-19 DIAGNOSIS — I272 Pulmonary hypertension, unspecified: Secondary | ICD-10-CM | POA: Insufficient documentation

## 2022-06-19 DIAGNOSIS — F32A Depression, unspecified: Secondary | ICD-10-CM | POA: Insufficient documentation

## 2022-06-19 DIAGNOSIS — D6859 Other primary thrombophilia: Secondary | ICD-10-CM | POA: Diagnosis not present

## 2022-06-19 DIAGNOSIS — Z7985 Long-term (current) use of injectable non-insulin antidiabetic drugs: Secondary | ICD-10-CM | POA: Insufficient documentation

## 2022-06-19 DIAGNOSIS — E871 Hypo-osmolality and hyponatremia: Secondary | ICD-10-CM | POA: Diagnosis not present

## 2022-06-19 DIAGNOSIS — Z791 Long term (current) use of non-steroidal anti-inflammatories (NSAID): Secondary | ICD-10-CM | POA: Insufficient documentation

## 2022-06-19 DIAGNOSIS — Z950 Presence of cardiac pacemaker: Secondary | ICD-10-CM | POA: Diagnosis not present

## 2022-06-19 DIAGNOSIS — Z794 Long term (current) use of insulin: Secondary | ICD-10-CM | POA: Insufficient documentation

## 2022-06-19 DIAGNOSIS — E782 Mixed hyperlipidemia: Secondary | ICD-10-CM | POA: Diagnosis not present

## 2022-06-19 DIAGNOSIS — N10 Acute tubulo-interstitial nephritis: Secondary | ICD-10-CM | POA: Insufficient documentation

## 2022-06-19 MED ORDER — FREESTYLE LIBRE SENSOR SYSTEM MISC
12 refills | Status: DC
  Filled 2022-06-19: qty 2, fill #0

## 2022-06-19 MED ORDER — BASAGLAR KWIKPEN 100 UNIT/ML ~~LOC~~ SOPN
52.0000 [IU] | PEN_INJECTOR | Freq: Every day | SUBCUTANEOUS | 6 refills | Status: DC
  Filled 2022-06-19 – 2022-08-09 (×2): qty 15, 28d supply, fill #0

## 2022-06-19 MED ORDER — FREESTYLE LIBRE READER DEVI
0 refills | Status: DC
  Filled 2022-06-19: qty 1, 1d supply, fill #0

## 2022-06-19 NOTE — Patient Instructions (Signed)
Increase Basaglar insulin to 52 units daily.  I have sent a prescription to the pharmacy for continuous glucose monitor for you.

## 2022-06-19 NOTE — Progress Notes (Signed)
Patient ID: Emily Farmer, female    DOB: 1955-12-26  MRN: [id number]  CC: Diabetes (Dm f/u. Med refills /Requesting ears to be checked, dryness sensation X2 weeks)   Subjective: Emily Farmer is a 67 y.o. 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 Kelton Pillar 05/2022 due to having URI at the time.  Rescheduled for March 19th.  Reports compliance with Basaglar 48 units daily, Humalog 16 units with meals, Jardiance 25 mg daily and Victoza 1.2 mg daily. Checks BS QID.  Does not thave log with her and did not bring glucometer with her.  Reports range BS 200-300s.  This a.m was 193 before BF. Never received Dexcom prescribed by endocrinologist Doing okay with eating habits HL: Reports compliance with taking Zetia 10 mg daily and atorvastatin 40 mg daily. PAF/CHF:  needs RF on Eliquis.  No bruising or bleeding.  No palpitations No shortness of breath or lower extremity edema.  Reports compliance with taking metoprolol, Cardizem, digoxin.  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/02/2022   Bacteremia 04/01/2021   Atrial fibrillation with slow ventricular response ([place]) 03/31/2021   Pulmonary hypertension ([place]) 03/19/2021   Hypomagnesemia    Sepsis ([place]) 02/27/2021   Atrial fibrillation with rapid ventricular response ([place]) 02/27/2021   Hypotension 02/27/2021   Secondary hypercoagulable state ([place]) 05/08/2020   Type 2 diabetes mellitus with diabetic polyneuropathy, with long-term current use of  insulin ([place]) 09/23/2019   Uncontrolled type 2 diabetes mellitus with hyperglycemia, with long-term current use of insulin ([place]) 09/23/2019   Dyslipidemia 09/23/2019   Toenail fungus 07/19/2018   Depression 07/19/2018   Iron deficiency anemia due to chronic blood loss 07/19/2018   Gastritis and duodenitis 07/19/2018   Chronic a-fib ([place]) 01/28/2018   Hyponatremia 01/28/2018   Chronic diastolic CHF (congestive heart failure) ([place]) 01/28/2018   Mixed hyperlipidemia 06/01/2017   Paroxysmal A-fib ([place]) 10/04/2016   Hypokalemia 01/04/2014   Acute pyelonephritis 02/15/2013   Essential hypertension 02/15/2013   Hypertriglyceridemia 02/15/2013   Diabetes mellitus type 2 in obese ([place]) 02/15/2013   Coronary atherosclerosis seen on CT 02/15/2013     Current Outpatient Medications on File Prior to Visit  Medication Sig Dispense Refill   acetaminophen (TYLENOL) 325 MG tablet Take 2 tablets (650 mg total) by mouth every 6 (six) hours as needed for mild pain (or Fever >/= 101).     apixaban (ELIQUIS) 5 MG TABS tablet Take 1 tablet (5 mg total) by mouth 2 (two) times daily. Resume taking on 9/24 with evening dose. 60 tablet 5   atorvastatin (LIPITOR) 40 MG tablet Take 1 tablet (40 mg total) by mouth daily. 90 tablet 3   Blood Glucose Monitoring Suppl (TRUE METRIX METER) w/Device KIT Use to monitor blood sugar as directed (Patient taking differently: 1 each by Other route See admin instructions. Use to monitor blood sugar as directed) 1 kit 0   Blood Pressure Monitoring (BLOOD PRESSURE MONITOR/ARM) DEVI Check blood pressure once per day at least 2 hours after medications. 1 each 0   Continuous Blood Gluc Transmit (  DEXCOM G6 TRANSMITTER) MISC 1 Device by Does not apply route as directed. 1 each 3   diclofenac Sodium (VOLTAREN) 1 % GEL Apply 2 g topically 4 (four) times daily. Apply across lower back for back pain (Patient taking differently: Apply 2 g topically 4 (four) times daily as needed (back pain).)  100 g 0   digoxin (LANOXIN) 0.125 MG tablet Take 1 tablet (0.125 mg total) by mouth daily. 90 tablet 3   diltiazem (CARDIZEM CD) 120 MG 24 hr capsule Take 1 capsule (120 mg total) by mouth daily. 60 capsule 5   empagliflozin (JARDIANCE) 25 MG TABS tablet Take 1 tablet (25 mg total) by mouth daily before breakfast. 90 tablet 2   ezetimibe (ZETIA) 10 MG tablet Take 1 tablet (10 mg total) by mouth daily. 90 tablet 3   glucose blood test strip USE AS INSTRUCTED TO CHECK BLOOD SUGARS 3 TIMES DAILY (Patient taking differently: 1 each by Other route See admin instructions. Check blood sugar 4 times daily) 100 each 11   insulin lispro (HUMALOG KWIKPEN) 100 UNIT/ML KwikPen Inject 16 Units into the skin 3 (three) times daily. 15 mL 2   Insulin Pen Needle 32G X 4 MM MISC use as directed in the morning, at noon, in the evening, and at bedtime. 400 each 3   Insulin Syringe-Needle U-100 (TRUEPLUS INSULIN SYRINGE) 31G X 5/16" 0.3 ML MISC Use to inject insulin daily. (Patient taking differently: 1 each by Other route See admin instructions. Use to inject insulin daily.) 100 each 11   liraglutide (VICTOZA) 18 MG/3ML SOPN Inject 1.2 mg into the skin daily. 6 mL 2   metoprolol succinate (TOPROL-XL) 100 MG 24 hr tablet Take 1 tablet (100 mg total) by mouth daily. Take with or immediately following a meal. 90 tablet 6   Multiple Vitamins-Minerals (CENTRUM SILVER PO) Take 1 tablet by mouth daily.     ferrous sulfate 325 (65 FE) MG tablet Take 1 tablet (325 mg total) by mouth daily with breakfast. 30 tablet 4   No current facility-administered medications on file prior to visit.    Allergies  Allergen Reactions   Trulicity [Dulaglutide] Diarrhea    Social History   Socioeconomic History   Marital status: Single    Spouse name: Not on file   Number of children: Not on file   Years of education: Not on file   Highest education level: Not on file  Occupational History   Not on file  Tobacco Use   Smoking  status: Never   Smokeless tobacco: Never  Vaping Use   Vaping Use: Never used  Substance and Sexual Activity   Alcohol use: No   Drug use: No   Sexual activity: Not Currently  Other Topics Concern   Not on file  Social History Narrative   Lives with her sister and does not need any assistance with ADLs.     Social Determinants of Health   Financial Resource Strain: Not on file  Food Insecurity: Not on file  Transportation Needs: Not on file  Physical Activity: Not on file  Stress: Not on file  Social Connections: Not on file  Intimate Partner Violence: Not on file    Family History  Problem Relation Age of Onset   Heart attack Mother    Heart attack Father    Diabetes Sister    High blood pressure Neg Hx    High Cholesterol Neg Hx     Past Surgical History:  Procedure Laterality Date  BIOPSY  06/20/2018   Procedure: BIOPSY;  Surgeon: Clarene Essex, MD;  Location: [facility];  Service: Endoscopy;;   CHOLECYSTECTOMY N/A 01/04/2014   Procedure: LAPAROSCOPIC CHOLECYSTECTOMY WITH INTRAOPERATIVE CHOLANGIOGRAM;  Surgeon: Ralene Ok, MD;  Location: [facility];  Service: General;  Laterality: N/A;   COLONOSCOPY WITH PROPOFOL N/A 06/21/2018   Procedure: COLONOSCOPY WITH PROPOFOL;  Surgeon: Ronald Lobo, MD;  Location: [facility];  Service: Endoscopy;  Laterality: N/A;   ESOPHAGOGASTRODUODENOSCOPY (EGD) WITH PROPOFOL N/A 06/20/2018   Procedure: ESOPHAGOGASTRODUODENOSCOPY (EGD) WITH PROPOFOL;  Surgeon: Clarene Essex, MD;  Location: [facility];  Service: Endoscopy;  Laterality: N/A;   LUNG SURGERY     PACEMAKER IMPLANT N/A 03/03/2022   Procedure: PACEMAKER IMPLANT;  Surgeon: Evans Lance, MD;  Location: [facility];  Service: Cardiovascular;  Laterality: N/A;    ROS: Review of Systems Negative except as stated above  PHYSICAL EXAM: BP 113/78 (BP Location: Left Arm, Patient Position: Sitting, Cuff Size: Normal)   Pulse 83   Temp 97.6 F (36.4 C) (Oral)   Ht _0   (1.6 m)   Wt 148 lb (67.1 kg)   SpO2 98%   BMI 26.22 kg/m   Physical Exam  General appearance - alert, well appearing, and in no distress Mental status - normal mood, behavior, speech, dress, motor activity, and thought processes Chest - clear to auscultation, no wheezes, rales or rhonchi, symmetric air entry Heart -irregularly irregular but rate controlled. Extremities -no lower extremity edema.      Latest Ref Rng & Units 03/27/2022   11:15 AM 03/03/2022    9:10 AM 03/01/2022    9:00 AM  CMP  Glucose 70 - 99 mg/dL 102  113  222   BUN 8 - 27 mg/dL 48  32  62   Creatinine 0.57 - 1.00 mg/dL 1.12  0.85  1.35   Sodium 134 - 144 mmol/L 140  140  137   Potassium 3.5 - 5.2 mmol/L 4.5  4.5  4.8   Chloride 96 - 106 mmol/L 102  102  107   CO2 20 - 29 mmol/L _1 Calcium 8.7 - 10.3 mg/dL 9.5  10.3  9.0   Total Protein 6.5 - 8.1 g/dL   5.8   Total Bilirubin 0.3 - 1.2 mg/dL   0.5   Alkaline Phos 38 - 126 U/L   67   AST 15 - 41 U/L   44   ALT 0 - 44 U/L   29    Lipid Panel     Component Value Date/Time   CHOL 132 04/21/2021 0143   CHOL 214 (H) 06/05/2020 0837   TRIG 263 (H) 04/21/2021 0143   HDL 33 (L) 04/21/2021 0143   HDL 40 06/05/2020 0837   CHOLHDL 4.0 04/21/2021 0143   VLDL 53 (H) 04/21/2021 0143   LDLCALC 46 04/21/2021 0143   LDLCALC 110 (H) 06/05/2020 0837   LDLDIRECT 137 (H) 03/01/2020 1012   LDLDIRECT 96.0 09/23/2019 0828    CBC    Component Value Date/Time   WBC 6.4 03/03/2022 0910   RBC 4.73 03/03/2022 0910   HGB 13.7 03/03/2022 0910   HGB 12.6 04/11/2021 1447   HCT 41.9 03/03/2022 0910   HCT 39.0 04/11/2021 1447   PLT 181 03/03/2022 0910   PLT 421 04/11/2021 1447   MCV 88.6 03/03/2022 0910   MCV 87 04/11/2021 1447   MCH 29.0 03/03/2022 0910   MCHC 32.7 03/03/2022 0910  RDW 13.2 03/03/2022 0910   RDW 13.8 04/11/2021 1447   LYMPHSABS 1.5 03/03/2022 0910   LYMPHSABS 1.9 04/11/2021 1447   MONOABS 0.7 03/03/2022 0910   EOSABS 0.3 03/03/2022 0910    EOSABS 0.4 04/11/2021 1447   BASOSABS 0.0 03/03/2022 0910   BASOSABS 0.0 04/11/2021 1447    ASSESSMENT AND PLAN:  1. Type 2 diabetes mellitus with hyperglycemia, with long-term current use of insulin (HCC) Last A1c had improved but not at goal. Recommend increase Basaglar to 52 units daily. Continue Jardiance 25 mg daily, Victoza 1.2 mg daily (intolerant of Trulicity - diarrhea) and Humalog 16 units with meals. Discussed trying to get a continuous glucose monitor for her again now that she has Medicare.  Prescription sent for [facility] device. - Continuous Blood Gluc Sensor (FREESTYLE LIBRE SENSOR SYSTEM) MISC; Change sensor every two weeks.  Dispense: 2 each; Refill: 12 - Continuous Blood Gluc Receiver (FREESTYLE LIBRE READER) DEVI; use as directed  Dispense: 1 each; Refill: 0 - Ambulatory referral to Ophthalmology - Lipid panel - Insulin Glargine (BASAGLAR KWIKPEN) 100 UNIT/ML; Inject 52 Units into the skin at bedtime.  Dispense: 15 mL; Refill: 6  2. Paroxysmal A-fib (HCC) Continue Eliquis, diltiazem and metoprolol as prescribed by cardiology.  3. Hypercoagulable state ([place]) Due to having atrial fibrillation.  4. Chronic diastolic CHF (congestive heart failure) (HCC) Stable and compensated.  Continue metoprolol, Jardiance.    Patient was given the opportunity to ask questions.  Patient verbalized understanding of the plan and was able to repeat key elements of the plan.   This documentation was completed using Radio producer.  Any transcriptional errors are unintentional.  Orders Placed This Encounter  Procedures   Lipid panel   Ambulatory referral to Ophthalmology     Requested Prescriptions   Signed Prescriptions Disp Refills   Continuous Blood Gluc Sensor (FREESTYLE LIBRE SENSOR SYSTEM) MISC 2 each 12    Sig: Change sensor every two weeks.   Continuous Blood Gluc Receiver (FREESTYLE LIBRE READER) DEVI 1 each 0    Sig: use as directed   Insulin  Glargine (BASAGLAR KWIKPEN) 100 UNIT/ML 15 mL 6    Sig: Inject 52 Units into the skin at bedtime.    Return in about 4 weeks (around 07/17/2022) for AWV - medicare wellness visit.  Karle Plumber, MD, FACP

## 2022-06-20 ENCOUNTER — Other Ambulatory Visit: Payer: Self-pay

## 2022-06-20 ENCOUNTER — Telehealth: Payer: Self-pay | Admitting: Internal Medicine

## 2022-06-20 LAB — LIPID PANEL
Chol/HDL Ratio: 11.8 ratio — ABNORMAL HIGH (ref 0.0–4.4)
Cholesterol, Total: 436 mg/dL — ABNORMAL HIGH (ref 100–199)
HDL: 37 mg/dL — ABNORMAL LOW (ref >39)
Triglycerides: 1149 mg/dL (ref 0–149)

## 2022-06-20 NOTE — Telephone Encounter (Signed)
Tried to reach pt this evening to discuss lab results.  Got VM for her sister Kella Splinter.  I did not leave a message.  Will try to reach again this weekend.

## 2022-06-21 ENCOUNTER — Telehealth: Payer: Self-pay | Admitting: Internal Medicine

## 2022-06-21 NOTE — Telephone Encounter (Signed)
Phone call placed to patient today to go over lab results.  I received her voicemail for Emily Farmer who is listed on her chart as sister.  I left a voicemail message informing of who I am and that I was trying to reach [facility].  I requested that she call the office on Monday.

## 2022-06-23 ENCOUNTER — Other Ambulatory Visit: Payer: Self-pay | Admitting: Internal Medicine

## 2022-06-23 ENCOUNTER — Other Ambulatory Visit: Payer: Self-pay

## 2022-06-23 ENCOUNTER — Telehealth: Payer: Self-pay | Admitting: Internal Medicine

## 2022-06-23 DIAGNOSIS — E785 Hyperlipidemia, unspecified: Secondary | ICD-10-CM

## 2022-06-23 MED ORDER — EZETIMIBE 10 MG PO TABS
10.0000 mg | ORAL_TABLET | Freq: Every day | ORAL | 3 refills | Status: DC
  Filled 2022-06-23: qty 30, 30d supply, fill #0

## 2022-06-23 MED ORDER — ATORVASTATIN CALCIUM 40 MG PO TABS
40.0000 mg | ORAL_TABLET | Freq: Every day | ORAL | 3 refills | Status: DC
  Filled 2022-06-23: qty 30, 30d supply, fill #0
  Filled 2022-08-19: qty 30, 30d supply, fill #1
  Filled 2022-10-17: qty 30, 30d supply, fill #2
  Filled 2023-01-09 (×2): qty 30, 30d supply, fill #3
  Filled 2023-05-09: qty 30, 30d supply, fill #4
  Filled 2023-05-11: qty 30, 30d supply, fill #0
  Filled 2023-05-11: qty 30, 30d supply, fill #4

## 2022-06-23 NOTE — Telephone Encounter (Signed)
Phone call placed to patient this morning to go over lab results.  Patient informed that her cholesterol and triglyceride levels are significantly elevated.  I inquired whether the patient has been taking her medications atorvastatin 40 mg and Zetia 10 mg consistently.  She informed me that she was not and had ran out of them.  Prior to running out of them she states she was taking them only a few days a week to stretch out which she had left.  Advised patient that when triglyceride level is as high as it is, it can cause pancreatitis.  I explained to her what pancreatitis is.  Also increase cholesterol increases the risk for heart attack and strokes.  Strongly advised her to take the medicines consistently and try to avoid running out of them.  Refills sent on both medicines to our pharmacy.  She plans to pick up today.  I will recheck cholesterol level when I see her next month.  Results for orders placed or performed in visit on 06/19/22  Lipid panel  Result Value Ref Range   Cholesterol, Total 436 (H) 100 - 199 mg/dL   Triglycerides 1,149 (HH) 0 - 149 mg/dL   HDL 37 (L) >39 mg/dL   VLDL Cholesterol Cal Comment (A) 5 - 40 mg/dL   LDL Chol Calc (NIH) Comment (A) 0 - 99 mg/dL   Chol/HDL Ratio 11.8 (H) 0.0 - 4.4 ratio

## 2022-06-27 ENCOUNTER — Other Ambulatory Visit: Payer: Self-pay

## 2022-07-02 ENCOUNTER — Other Ambulatory Visit: Payer: Self-pay

## 2022-07-03 ENCOUNTER — Other Ambulatory Visit: Payer: Self-pay

## 2022-07-08 ENCOUNTER — Ambulatory Visit: Payer: Medicare Other | Attending: Family Medicine | Admitting: Pharmacist

## 2022-07-08 ENCOUNTER — Other Ambulatory Visit: Payer: Self-pay

## 2022-07-08 DIAGNOSIS — E1165 Type 2 diabetes mellitus with hyperglycemia: Secondary | ICD-10-CM

## 2022-07-08 DIAGNOSIS — Z794 Long term (current) use of insulin: Secondary | ICD-10-CM

## 2022-07-08 MED ORDER — INSULIN PEN NEEDLE 32G X 4 MM MISC
1.0000 | Freq: Four times a day (QID) | 3 refills | Status: DC
  Filled 2022-07-08: qty 100, 25d supply, fill #0

## 2022-07-08 MED ORDER — TRUE METRIX BLOOD GLUCOSE TEST VI STRP: ORAL_STRIP | 12 refills | Status: DC

## 2022-07-08 MED ORDER — TRUE METRIX BLOOD GLUCOSE TEST VI STRP
ORAL_STRIP | 12 refills | Status: DC
  Filled 2022-07-08: qty 100, 90d supply, fill #0

## 2022-07-08 NOTE — Progress Notes (Signed)
    S:     No chief complaint on file.  67 y.o. female who presents for diabetes evaluation, education, and management.  PMH is significant for HTN, PAF, T2DM, and HLD.  Patient was referred and last seen by Primary Care Provider, Dr. Wynetta Emery, on 06/19/22. At that visit, increased her Basaglar to 52 units daily.   Today, patient arrives in good spirits and presents without any assistance. She is a poor historian and frequently gives contradictory statements. She admits to medication nonadherence. Reports missing a couple doses of her insulin per week, on average. States she has been running out of pen needles and tried to stretch out her supply. States she has had some insurance problems when filling medications.  Family Hx: diabetes (sister); heart attack (mom and dad) Social Hx: Denies use of tobacco and alcohol.  Current diabetes medications include: Basaglar 52 units QPM, Humalog 16 units TID AC, Jardiance 25 mg daily, Victoza 1.2 mg daily  Insurance coverage: Medicare/Medicaid  Patient denies hypoglycemic events.  Patient denies nocturia (nighttime urination).  Patient denies neuropathy (nerve pain). Patient reports visual changes. Patient reports self foot exams.   Patient reported dietary habits: Patient states she has been eating spaghetti and bread but has been trying to decrease the amount she eats.  Patient-reported exercise habits: Endorses walking for 1 hour daily.  O:  Brings home meter for review:   7 day average: 268 14 day average: 259 30 day average: 250  ROS  Physical Exam   Lab Results  Component Value Date   HGBA1C 9.5 (A) 04/28/2022   There were no vitals filed for this visit.  Lipid Panel     Component Value Date/Time   CHOL 436 (H) 06/19/2022 1156   TRIG 1,149 (HH) 06/19/2022 1156   HDL 37 (L) 06/19/2022 1156   CHOLHDL 11.8 (H) 06/19/2022 1156   CHOLHDL 4.0 04/21/2021 0143   VLDL 53 (H) 04/21/2021 0143   LDLCALC Comment (A) 06/19/2022  1156   LDLDIRECT 137 (H) 03/01/2020 1012   LDLDIRECT 96.0 09/23/2019 0828    Clinical Atherosclerotic Cardiovascular Disease (ASCVD): No  The ASCVD Risk score (Arnett DK, et al., 2019) failed to calculate for the following reasons:   The valid total cholesterol range is 130 to 320 mg/dL   A/P: Diabetes longstanding currently uncontrolled. Patient is able to verbalize appropriate hypoglycemia management plan. Medication adherence appears suboptimal. Her averages are better compared to her last visit with me but still far above goal. I think she struggles with non-adherence more than she admits. I have coordinated with our pharmacy to give her assistance. Will provide her with pen needles today.  -Continued current regimen. Hard to make adjustment based on her history reported today. Will have her return in two weeks for reassessment.  -Extensively discussed pathophysiology of diabetes, recommended lifestyle interventions, dietary effects on blood sugar control.  -Counseled on s/sx of and management of hypoglycemia.  -Next A1c anticipated 07/2022.   Written patient instructions provided. Patient verbalized understanding of treatment plan.  Total time in face to face counseling 30 minutes.    Follow-up:  Pharmacist 2 weeks.  Benard Halsted, PharmD, [place], [place] [phone]

## 2022-07-18 ENCOUNTER — Encounter: Payer: Medicare Other | Admitting: Internal Medicine

## 2022-07-21 ENCOUNTER — Encounter: Payer: Self-pay | Admitting: Internal Medicine

## 2022-07-21 ENCOUNTER — Ambulatory Visit: Payer: Medicare HMO | Attending: Internal Medicine | Admitting: Internal Medicine

## 2022-07-21 ENCOUNTER — Other Ambulatory Visit: Payer: Self-pay | Admitting: Internal Medicine

## 2022-07-21 ENCOUNTER — Other Ambulatory Visit: Payer: Self-pay

## 2022-07-21 VITALS — BP 116/78 | HR 82 | Temp 97.8°F | Ht 63.0 in | Wt 154.0 lb

## 2022-07-21 DIAGNOSIS — Z23 Encounter for immunization: Secondary | ICD-10-CM | POA: Diagnosis not present

## 2022-07-21 DIAGNOSIS — Z0000 Encounter for general adult medical examination without abnormal findings: Secondary | ICD-10-CM

## 2022-07-21 DIAGNOSIS — Z7189 Other specified counseling: Secondary | ICD-10-CM

## 2022-07-21 DIAGNOSIS — Z780 Asymptomatic menopausal state: Secondary | ICD-10-CM | POA: Diagnosis not present

## 2022-07-21 DIAGNOSIS — E1165 Type 2 diabetes mellitus with hyperglycemia: Secondary | ICD-10-CM

## 2022-07-21 NOTE — Progress Notes (Signed)
Subjective:    Emily Farmer is a 67 y.o. female who presents for a Welcome to Medicare exam.  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  Medications Outpatient Encounter Medications as of 07/21/2022  Medication Sig   acetaminophen (TYLENOL) 325 MG tablet Take 2 tablets (650 mg total) by mouth every 6 (six) hours as needed for mild pain (or Fever >/= 101).   apixaban (ELIQUIS) 5 MG TABS tablet Take 1 tablet (5 mg total) by mouth 2 (two) times daily. Resume taking on 9/24 with evening dose.   atorvastatin (LIPITOR) 40 MG tablet Take 1 tablet (40 mg total) by mouth daily.   Blood Glucose Monitoring Suppl (TRUE METRIX METER) w/Device KIT Use to monitor blood sugar as directed (Patient taking differently: 1 each by Other route See admin instructions. Use to monitor blood sugar as directed)   Blood Pressure Monitoring (BLOOD PRESSURE MONITOR/ARM) DEVI Check blood pressure once per day at least 2 hours after medications.   Continuous Blood Gluc Receiver (FREESTYLE LIBRE READER) DEVI use as directed   Continuous Blood Gluc Sensor (FREESTYLE LIBRE SENSOR SYSTEM) MISC Change sensor every two weeks.   Continuous Blood Gluc Transmit (DEXCOM G6 TRANSMITTER) MISC 1 Device by Does not apply route as directed.   diclofenac Sodium (VOLTAREN) 1 % GEL Apply 2 g topically 4 (four) times daily. Apply across lower back for back pain (Patient taking differently: Apply 2 g topically 4 (four) times daily as needed  (back pain).)   digoxin (LANOXIN) 0.125 MG tablet Take 1 tablet (0.125 mg total) by mouth daily.   diltiazem (CARDIZEM CD) 120 MG 24 hr capsule Take 1 capsule (120 mg total) by mouth daily.   empagliflozin (JARDIANCE) 25 MG TABS tablet Take 1 tablet (25 mg total) by mouth daily before breakfast.   ezetimibe (ZETIA) 10 MG tablet Take 1 tablet (10 mg total) by mouth daily.   glucose blood (TRUE METRIX BLOOD GLUCOSE TEST) test strip Use to check blood sugar three times daily.   Insulin Glargine (BASAGLAR KWIKPEN) 100 UNIT/ML Inject 52 Units into the skin at bedtime.   insulin lispro (HUMALOG KWIKPEN) 100 UNIT/ML KwikPen Inject 16 Units into the skin 3 (three) times daily.   Insulin Pen Needle 32G X 4 MM MISC Use as directed in the morning, at noon, in the evening, and at bedtime.   Insulin Syringe-Needle U-100 (TRUEPLUS INSULIN SYRINGE) 31G X 5/16" 0.3 ML MISC Use to inject insulin daily. (Patient taking differently: 1 each by Other route See admin instructions. Use to inject insulin daily.)   liraglutide (VICTOZA) 18 MG/3ML SOPN Inject 1.2 mg into the skin daily.   metoprolol succinate (TOPROL-XL) 100 MG 24 hr tablet Take 1 tablet (100 mg total) by mouth daily. Take with or immediately following a meal.   Multiple Vitamins-Minerals (CENTRUM SILVER PO) Take 1 tablet by mouth daily.   ferrous sulfate 325 (65 FE) MG tablet Take 1 tablet (  325 mg total) by mouth daily with breakfast.   No facility-administered encounter medications on file as of 07/21/2022.     History: Past Medical History:  Diagnosis Date   Arthritis    Atrial fibrillation ([place])    a. 09/2016 in setting of pancreatitis;  b. 09/2016 Echo: EF 65-60%, no rwma, Gr2 DD, mild MR, triv TR, PASP 36mmHg;  CHA2DS2VASc = 4-->Eliquis 5mg  BID. c. Recurred 06/2018 in setting of GIB.   Chronic diastolic CHF (congestive heart failure) ([place]) 01/28/2018   Diabetes mellitus    GI bleeding 06/2018   Gout    Hyperlipidemia    Hypertension     Hypertriglyceridemia    Pancreatitis    a. 09/2016 - Triglycerides 1,392 on admission.   Past Surgical History:  Procedure Laterality Date   BIOPSY  06/20/2018   Procedure: BIOPSY;  Surgeon: Clarene Essex, MD;  Location: [facility];  Service: Endoscopy;;   CHOLECYSTECTOMY N/A 01/04/2014   Procedure: LAPAROSCOPIC CHOLECYSTECTOMY WITH INTRAOPERATIVE CHOLANGIOGRAM;  Surgeon: Ralene Ok, MD;  Location: [facility];  Service: General;  Laterality: N/A;   COLONOSCOPY WITH PROPOFOL N/A 06/21/2018   Procedure: COLONOSCOPY WITH PROPOFOL;  Surgeon: Ronald Lobo, MD;  Location: [facility];  Service: Endoscopy;  Laterality: N/A;   ESOPHAGOGASTRODUODENOSCOPY (EGD) WITH PROPOFOL N/A 06/20/2018   Procedure: ESOPHAGOGASTRODUODENOSCOPY (EGD) WITH PROPOFOL;  Surgeon: Clarene Essex, MD;  Location: [facility];  Service: Endoscopy;  Laterality: N/A;   LUNG SURGERY     PACEMAKER IMPLANT N/A 03/03/2022   Procedure: PACEMAKER IMPLANT;  Surgeon: Evans Lance, MD;  Location: [facility];  Service: Cardiovascular;  Laterality: N/A;    Family History  Problem Relation Age of Onset   Heart attack Mother    Heart attack Father    Diabetes Sister    High blood pressure Neg Hx    High Cholesterol Neg Hx    Social History   Occupational History   Not on file  Tobacco Use   Smoking status: Never   Smokeless tobacco: Never  Vaping Use   Vaping Use: Never used  Substance and Sexual Activity   Alcohol use: No   Drug use: No   Sexual activity: Not Currently    Tobacco Counseling Pt is nonsmoker Pt does not drink ETOH beverages  Immunizations and Health Maintenance Immunization History  Administered Date(s) Administered   Influenza,inj,Quad PF,6+ Mos 04/13/2019, 03/19/2021, 02/06/2022   PFIZER(Purple Top)SARS-COV-2 Vaccination 09/13/2019, 10/04/2019, 03/14/2020, 04/10/2020   Pneumococcal Conjugate (Pcv15) 06/18/2021   Pneumococcal Polysaccharide-23 05/06/2018   Tdap 07/21/2022   Zoster Recombinat  (Shingrix) 10/17/2021, 02/06/2022   Health Maintenance Due  Topic Date Due   OPHTHALMOLOGY EXAM  07/11/2021   COVID-19 Vaccine (5 - 2023-24 season) 02/14/2022   DEXA SCAN  Never done  Pt reports last Td booster was about 12 yrs ago.   Agrees to Tdapt vaccine today  Activities of Daily Living    07/21/2022    3:42 PM  In your present state of health, do you have any difficulty performing the following activities:  Hearing? 0  Vision? 1  Difficulty concentrating or making decisions? 0  Walking or climbing stairs? 1  Dressing or bathing? 0  Doing errands, shopping? 0  Preparing Food and eating ? N  Using the Toilet? N  In the past six months, have you accidently leaked urine? N  Do you have problems with loss of bowel control? N  Managing your Medications? N  Managing your Finances? N  Housekeeping or managing your  Housekeeping? N    Physical Exam  (optional), or other factors deemed appropriate based on the beneficiary's medical and social history and current clinical standards. General: Older Caucasian female in NAD Chest: Clear to auscultation bilaterally CVS: Irregularly irregular rate that is controlled. Extremities: No lower extremity edema  Advanced Directives: Does Patient Have a Medical Advance Directive?: No Would patient like information on creating a medical advance directive?: No - Patient declined   Pt was not aware of what Advance Directive means.  I went over this with her including what is an advanced directive, living will and healthcare power of attorney.  She was agreeable to receiving information about it.  I gave her one of our packets.  Assessment:    This is a routine wellness examination for this patient .   Vision/Hearing screen Vision Screening   Right eye Left eye Both eyes  Without correction 20/50 20/100 20/70  With correction     Referred to ophthalmologist Dr. Katy Fitch on last visit earlier this mth.  Has appt in May of this yr.  Wears reading  glasses from [employer].  Does not drive; use pulblic transportation Whisper test normal  Dietary issues and exercise activities discussed:   Thinks she is doing better with eating habits.  Trying to stay away from sugar drinks.   Walk for exercise about 3 x/wk   Goals   None   Depression Screen    07/21/2022    3:40 PM 07/21/2022    3:34 PM 06/19/2022   10:39 AM 10/17/2021   11:09 AM  PHQ 2/9 Scores  PHQ - 2 Score 0 0 1 1  PHQ- 9 Score 2 2 4 5      Fall Risk    07/21/2022    3:34 PM  [place] in the past year? 0  Number falls in past yr: 0  Injury with Fall? 0  Risk for fall due to : No Fall Risks    Cognitive Function:    07/21/2022    3:52 PM  MMSE - Mini Mental State Exam  Orientation to time 4  Orientation to Place 5  Registration 3  Attention/ Calculation 5  Recall 2  Language- name 2 objects 2  Language- repeat 1  Language- follow 3 step command 3  Language- read & follow direction 1  Write a sentence 1  Copy design 1  Total score 28        Patient Care Team: Ladell Pier, MD as PCP - General (Internal Medicine) Debara Pickett Nadean Corwin, MD as PCP - Cardiology (Cardiology) Vickie Epley, MD as PCP - Electrophysiology (Cardiology) [place], Heath Gold, RN Dr. Katy Fitch - Ophthalmologist    Plan:   1. Encounter for Medicare annual wellness exam   2. Postmenopausal estrogen deficiency Patient agreeable to having bone density study done. - DG Bone Density; Future  3. Need for Tdap vaccination Tdap given today.  Advised that the injection can cause some redness and soreness at the site.  4. Advance directive discussed with patient See discussion above.  Patient given a packet to take home and review.  Advised that should she execute a living will or healthcare power of attorney, please bring a copy for our records.  5. Need for third booster dose of COVID-19 vaccine She is in need of an updated COVID booster.  Encouraged her to get this done at any  outside pharmacy.   I have personally reviewed and noted the following in the  patient's chart:   Medical and social history Use of alcohol, tobacco or illicit drugs  Current medications and supplements Functional ability and status Nutritional status Physical activity Advanced directives List of other physicians Hospitalizations, surgeries, and ER visits in previous 12 months Vitals Screenings to include cognitive, depression, and falls Referrals and appointments  In addition, I have reviewed and discussed with patient certain preventive protocols, quality metrics, and best practice recommendations. A written personalized care plan for preventive services as well as general preventive health recommendations were provided to patient.     Jonah Blue, MD 07/21/2022

## 2022-07-22 ENCOUNTER — Other Ambulatory Visit: Payer: Self-pay

## 2022-07-22 ENCOUNTER — Telehealth: Payer: Self-pay | Admitting: Emergency Medicine

## 2022-07-22 NOTE — Telephone Encounter (Signed)
Copied from [place]. Topic: General - Other >> Jul 22, 2022  3:25 PM Ja-Kwan M wrote: Reason for CRM: Pt requests that Dr. Wynetta Emery be made aware that her eye appt is scheduled for 10/22/22 at 1:45 pm.

## 2022-07-22 NOTE — Telephone Encounter (Signed)
Requested medication (s) are due for refill today: yes  Requested medication (s) are on the active medication list: no  Last refill:  03/27/20 100 each 11 RF  Future visit scheduled: yes  Notes to clinic:  end date 03/27/21   Requested Prescriptions  Pending Prescriptions Disp Refills   TRUEplus Lancets 28G MISC 100 each 11    Sig: USE TO CHECK BLOOD SUGAR 3 TIMES DAILY     Endocrinology: Diabetes - Testing Supplies Passed - 07/21/2022  7:48 AM      Passed - Valid encounter within last 12 months    Recent Outpatient Visits           Yesterday Encounter for Medicare annual wellness exam   [place] Karle Plumber B, MD   2 weeks ago Type 2 diabetes mellitus with hyperglycemia, with long-term current use of insulin [facility])   [place], [place] L, RPH-CPP   1 month ago Type 2 diabetes mellitus with hyperglycemia, with long-term current use of insulin [facility])   [place] Karle Plumber B, MD   1 month ago Type 2 diabetes mellitus with hyperglycemia, with long-term current use of insulin [facility])   [place], Hoehne L, RPH-CPP   2 months ago Type 2 diabetes mellitus with hyperglycemia, with long-term current use of insulin [facility])   [place], Stephen L, RPH-CPP       Future Appointments             In 3 weeks Daisy Blossom, Jarome Matin, [place]   In 3 months Wynetta Emery, Dalbert Batman, MD [place]

## 2022-07-23 ENCOUNTER — Other Ambulatory Visit: Payer: Self-pay | Admitting: Family Medicine

## 2022-07-23 ENCOUNTER — Other Ambulatory Visit: Payer: Self-pay

## 2022-07-23 DIAGNOSIS — E1165 Type 2 diabetes mellitus with hyperglycemia: Secondary | ICD-10-CM

## 2022-07-23 MED ORDER — TRUEPLUS LANCETS 28G MISC
11 refills | Status: DC
  Filled 2022-07-23: qty 100, 33d supply, fill #0
  Filled 2022-10-30: qty 100, 33d supply, fill #1
  Filled 2022-12-19: qty 100, 33d supply, fill #2

## 2022-07-23 MED ORDER — TRUE METRIX BLOOD GLUCOSE TEST VI STRP
ORAL_STRIP | 12 refills | Status: DC
  Filled 2022-07-23: qty 100, 33d supply, fill #0
  Filled 2022-08-20: qty 100, 33d supply, fill #1
  Filled 2022-10-31: qty 100, 33d supply, fill #2
  Filled 2022-12-10: qty 100, 33d supply, fill #3

## 2022-07-24 ENCOUNTER — Other Ambulatory Visit: Payer: Self-pay

## 2022-07-24 ENCOUNTER — Ambulatory Visit: Payer: Self-pay | Admitting: Pharmacist

## 2022-08-07 ENCOUNTER — Telehealth: Payer: Self-pay

## 2022-08-07 NOTE — Telephone Encounter (Signed)
Outreach made to Pt.  She has not yet connected remotely.   She states she has not plugged it in because she does not have internet.    Advised it did not work off her internet.  Requested she please plug it in by her bed and it should work.  She agrees to plug monitor in.  Will schedule remote transmission to ensure it is connecting.

## 2022-08-11 ENCOUNTER — Other Ambulatory Visit: Payer: Self-pay

## 2022-08-12 ENCOUNTER — Ambulatory Visit: Payer: Medicare Other | Admitting: Pharmacist

## 2022-08-12 ENCOUNTER — Other Ambulatory Visit: Payer: Self-pay

## 2022-08-13 NOTE — Telephone Encounter (Signed)
The patient reached out to [employer] and the patient do not have any cell towers in her area. She will not be able to use any kind of monitor. [employer] advised her to bring her monitor back to the office for use to send it back. I told her since she cannot use a monitor she will need to come into the office every 6 months to get interrogated.

## 2022-08-20 ENCOUNTER — Other Ambulatory Visit: Payer: Self-pay

## 2022-08-20 NOTE — Progress Notes (Signed)
Name: Emily Farmer  Age/ Sex: 67 y.o., female   MRN/ DOB: [id number], 05/08/1956     PCP: Ladell Pier, MD   Reason for Endocrinology Evaluation: Type 2 Diabetes Mellitus  Initial Endocrine Consultative Visit: 09/26/2019    PATIENT IDENTIFIER: Emily Farmer is a 67 y.o. female with a past medical history of DM, HTN, A.Fib , S/P pacemaker  and dyslipidemia. The patient has followed with Endocrinology clinic since 09/26/2019 for consultative assistance with management of her diabetes.  DIABETIC HISTORY:  Emily Farmer was diagnosed with DM in 2013, has been on insulin and metformin since diagnosis. Her hemoglobin A1c has ranged from 9.5% in 2020, peaking at 12.2% in 2019    On initial visit to our clinic, her A1c was 12.4% . She was on Lantus and metformin and we added Novolog    Stopped metformin 05/2021 and started Jardiance  Trulicity caused diarrhea   Lives with sister   SUBJECTIVE:   During the last visit (12/10/2021): A1c 14.1%   Today (08/22/2022): Emily Farmer is here for a follow up on diabetes management. She has NOT been to our clinic in 9 months.  She checks her blood sugars 1 time a day .Marland Kitchen The patient has had hypoglycemic episodes since the last clinic visit. She is symptomatic with these episodes   She had gastritis last week but has resolved  Denies local neck swelling  She has retired from  bring a  Catering manager  She stubbed her right great toe , no pain at  this time   [place]:  Basaglar 52 units daily Humalog 16 unitsTIDQAC Jardiance 25 mg daily  Victoza 1.2 mg daily daily  Correction scale : HUmalog (BG -130/30)     Statin: Yes ACE-I/ARB: no  METER DOWNLOAD SUMMARY:Unable to download  43-  504 mg/dL         DIABETIC COMPLICATIONS: Microvascular complications:  Fluctuating GFR, cataract, neuropathy  Last eye exam: Completed 06/2020   Macrovascular complications:    Denies: CAD, PVD, CVA      HISTORY:  Past  Medical History:  Past Medical History:  Diagnosis Date   Arthritis    Atrial fibrillation ([place])    a. 09/2016 in setting of pancreatitis;  b. 09/2016 Echo: EF 65-60%, no rwma, Gr2 DD, mild MR, triv TR, PASP 54mmHg;  CHA2DS2VASc = 4-->Eliquis 5mg  BID. c. Recurred 06/2018 in setting of GIB.   Chronic diastolic CHF (congestive heart failure) ([place]) 01/28/2018   Diabetes mellitus    GI bleeding 06/2018   Gout    Hyperlipidemia    Hypertension    Hypertriglyceridemia    Pancreatitis    a. 09/2016 - Triglycerides 1,392 on admission.   Past Surgical History:  Past Surgical History:  Procedure Laterality Date   BIOPSY  06/20/2018   Procedure: BIOPSY;  Surgeon: Clarene Essex, MD;  Location: [place] ;  Service: Endoscopy;;   CHOLECYSTECTOMY N/A 01/04/2014   Procedure: LAPAROSCOPIC CHOLECYSTECTOMY WITH INTRAOPERATIVE CHOLANGIOGRAM;  Surgeon: Ralene Ok, MD;  Location: [place];  Service: General;  Laterality: N/A;   COLONOSCOPY WITH PROPOFOL N/A 06/21/2018   Procedure: COLONOSCOPY WITH PROPOFOL;  Surgeon: Ronald Lobo, MD;  Location: [place];  Service: Endoscopy;  Laterality: N/A;   ESOPHAGOGASTRODUODENOSCOPY (EGD) WITH PROPOFOL N/A 06/20/2018   Procedure: ESOPHAGOGASTRODUODENOSCOPY (EGD) WITH PROPOFOL;  Surgeon: Clarene Essex, MD;  Location: [place];  Service: Endoscopy;  Laterality: N/A;   LUNG SURGERY     PACEMAKER IMPLANT N/A 03/03/2022  Procedure: PACEMAKER IMPLANT;  Surgeon: Evans Lance, MD;  Location: [facility];  Service: Cardiovascular;  Laterality: N/A;   Social History:  reports that she has never smoked. She has never used smokeless tobacco. She reports that she does not drink alcohol and does not use drugs. Family History:  Family History  Problem Relation Age of Onset   Heart attack Mother    Heart attack Father    Diabetes Sister    High blood pressure Neg Hx    High Cholesterol Neg Hx      HOME MEDICATIONS: Allergies as of 08/22/2022       Reactions    Trulicity [dulaglutide] Diarrhea        Medication List        Accurate as of August 22, 2022  9:10 AM. If you have any questions, ask your nurse or doctor.          STOP taking these medications    Dexcom G6 Transmitter Misc Stopped by: Dorita Sciara, MD   FreeStyle Libre Reader Kerrin Mo Stopped by: Dorita Sciara, MD       TAKE these medications    acetaminophen 325 MG tablet Commonly known as: TYLENOL Take 2 tablets (650 mg total) by mouth every 6 (six) hours as needed for mild pain (or Fever >/= 101).   apixaban 5 MG Tabs tablet Commonly known as: ELIQUIS Take 1 tablet (5 mg total) by mouth 2 (two) times daily. Resume taking on 9/24 with evening dose.   atorvastatin 40 MG tablet Commonly known as: LIPITOR Take 1 tablet (40 mg total) by mouth daily.   Blood Pressure Monitor/Arm Devi Check blood pressure once per day at least 2 hours after medications.   CENTRUM SILVER PO Take 1 tablet by mouth daily.   Dexcom G7 Sensor Misc 1 Device by Does not apply route as directed. What changed:  how much to take how to take this when to take this additional instructions Changed by: Dorita Sciara, MD   diclofenac Sodium 1 % Gel Commonly known as: Voltaren Apply 2 g topically 4 (four) times daily. Apply across lower back for back pain What changed:  when to take this reasons to take this additional instructions   digoxin 0.125 MG tablet Commonly known as: LANOXIN Take 1 tablet (0.125 mg total) by mouth daily.   diltiazem 120 MG 24 hr capsule Commonly known as: Cardizem CD Take 1 capsule (120 mg total) by mouth daily.   ezetimibe 10 MG tablet Commonly known as: Zetia Take 1 tablet (10 mg total) by mouth daily.   FeroSul 325 (65 FE) MG tablet Generic drug: ferrous sulfate Take 1 tablet (325 mg total) by mouth daily with breakfast.   insulin lispro 100 UNIT/ML KwikPen Commonly known as: HumaLOG KwikPen Max daily 45 units What changed:   how much to take how to take this when to take this additional instructions Changed by: Dorita Sciara, MD   Insulin Pen Needle 32G X 4 MM Misc use in the morning, at noon, in the evening, and at bedtime.   Insulin Syringe-Needle U-100 31G X 5/16" 0.3 ML Misc Commonly known as: TRUEplus Insulin Syringe Use to inject insulin daily. What changed:  how much to take how to take this when to take this   Jardiance 25 MG Tabs tablet Generic drug: empagliflozin Take 1 tablet (25 mg total) by mouth daily before breakfast.   Lantus SoloStar 100 UNIT/ML Solostar Pen Generic drug: insulin  glargine Inject 52 Units into the skin daily. What changed: when to take this Changed by: Dorita Sciara, MD   metoprolol succinate 100 MG 24 hr tablet Commonly known as: TOPROL-XL Take 1 tablet (100 mg total) by mouth daily. Take with or immediately following a meal.   pioglitazone 15 MG tablet Commonly known as: Actos Take 1 tablet (15 mg total) by mouth daily. Started by: Dorita Sciara, MD   True Metrix Blood Glucose Test test strip Generic drug: glucose blood Use as instructed   True Metrix Meter w/Device Kit Use to monitor blood sugar as directed What changed:  how much to take how to take this when to take this   TRUEplus Lancets 28G Misc USE TO CHECK BLOOD SUGAR 3 TIMES DAILY   Victoza 18 MG/3ML Sopn Generic drug: liraglutide Inject 1.8 mg into the skin daily. What changed: how much to take Changed by: Dorita Sciara, MD         OBJECTIVE:   Vital Signs: BP 120/72 (BP Location: Left Arm, Patient Position: Sitting, Cuff Size: Large)   Pulse 76   Ht 5\' 3"  (1.6 m)   Wt 153 lb 6.4 oz (69.6 kg)   SpO2 98%   BMI 27.17 kg/m   Wt Readings from Last 3 Encounters:  08/22/22 153 lb 6.4 oz (69.6 kg)  07/21/22 154 lb (69.9 kg)  06/19/22 148 lb (67.1 kg)     Exam: General: Pt appears well and is in NAD  Lungs: Clear with good BS bilat   Heart:  RRR  Extremities: No pretibial edema.   Neuro: MS is good with appropriate affect, pt is alert and Ox3   DM foot exam: 08/22/2022   The skin of the feet is intact without sores or ulcerations, pt with subungual right great toe hematoma, with thickened yellow toe nail The pedal pulses are 1+ on right and 1+ on left. The sensation is absent  to a screening 5.07, 10 gram monofilament bilaterally  DATA REVIEWED:  Lab Results  Component Value Date   HGBA1C 10.2 (A) 08/22/2022   HGBA1C 9.5 (A) 04/28/2022   HGBA1C 10.7 (A) 02/06/2022     Latest Reference Range & Units 03/27/22 11:15  Sodium 134 - 144 mmol/L 140  Potassium 3.5 - 5.2 mmol/L 4.5  Chloride 96 - 106 mmol/L 102  CO2 20 - 29 mmol/L 22  Glucose 70 - 99 mg/dL 102 (H)  BUN 8 - 27 mg/dL 48 (H)  Creatinine 0.57 - 1.00 mg/dL 1.12 (H)  Calcium 8.7 - 10.3 mg/dL 9.5  BUN/Creatinine Ratio 12 - 28  43 (H)  eGFR >59 mL/min/1.73 55 (L)    Latest Reference Range & Units 06/19/22 11:56  Total CHOL/HDL Ratio 0.0 - 4.4 ratio 11.8 (H)  Cholesterol, Total 100 - 199 mg/dL 436 (H)  HDL Cholesterol >39 mg/dL 37 (L)  Triglycerides 0 - 149 mg/dL 1,149 (HH)  VLDL Cholesterol Cal 5 - 40 mg/dL Comment !  LDL Chol Calc (NIH) 0 - 99 mg/dL Comment !    ASSESSMENT / PLAN / RECOMMENDATIONS:   1) Type 2 Diabetes Mellitus, Poorly controlled, With neuropathic complications - Most recent A1c of 10.2 %. Goal A1c < 7.0 %.    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 I reviewed the Rule of 15 for the treatment of hypoglycemia in detail with the patient. Literature supplied.    2) Diabetic complications:  Eye: Does not have known diabetic retinopathy.  Neuro/ Feet: Does have known diabetic peripheral neuropathy. Renal: Patient does have known baseline CKD. She is not on an ACEI/ARB at present.    3) Peripheral Neuropathy:  -Referral to podiatry has been placed   F/U in 6 months   Signed electronically by: Mack Guise, MD  [facility]  [address] [place], [place] [address] Phone: [phone] FAX: [phone]   CC: Ladell Pier, MD [place] [address] Phone: [phone]  Fax: [phone]  Return to Endocrinology clinic as below: Future Appointments  Date Time Provider [place]  08/22/2022  9:30 AM Jerolene Kupfer, Melanie Crazier, MD [facility] None  09/15/2022  9:30 AM Tresa Endo, RPH-CPP [facility] None  11/18/2022  9:10 AM Ladell Pier, MD [facility] None  12/08/2022  1:45 PM Evans Lance, MD [facility]  01/30/2023  2:00 PM [facility] DX DEXA 1 [facility]  02/27/2023  1:20 PM Trevin Gartrell, Melanie Crazier, MD [facility] None

## 2022-08-21 ENCOUNTER — Other Ambulatory Visit: Payer: Self-pay

## 2022-08-22 ENCOUNTER — Ambulatory Visit (INDEPENDENT_AMBULATORY_CARE_PROVIDER_SITE_OTHER): Payer: Medicare HMO | Admitting: Internal Medicine

## 2022-08-22 ENCOUNTER — Other Ambulatory Visit: Payer: Self-pay

## 2022-08-22 ENCOUNTER — Encounter: Payer: Self-pay | Admitting: Internal Medicine

## 2022-08-22 VITALS — BP 120/72 | HR 76 | Ht 63.0 in | Wt 153.4 lb

## 2022-08-22 DIAGNOSIS — Z794 Long term (current) use of insulin: Secondary | ICD-10-CM

## 2022-08-22 DIAGNOSIS — G63 Polyneuropathy in diseases classified elsewhere: Secondary | ICD-10-CM

## 2022-08-22 DIAGNOSIS — E1165 Type 2 diabetes mellitus with hyperglycemia: Secondary | ICD-10-CM | POA: Diagnosis not present

## 2022-08-22 DIAGNOSIS — E1142 Type 2 diabetes mellitus with diabetic polyneuropathy: Secondary | ICD-10-CM

## 2022-08-22 LAB — POCT GLYCOSYLATED HEMOGLOBIN (HGB A1C): Hemoglobin A1C: 10.2 % — AB (ref 4.0–5.6)

## 2022-08-22 MED ORDER — INSULIN LISPRO (1 UNIT DIAL) 100 UNIT/ML (KWIKPEN)
PEN_INJECTOR | SUBCUTANEOUS | 2 refills | Status: DC
  Filled 2022-08-22: qty 39, 86d supply, fill #0
  Filled 2022-12-30: qty 39, 86d supply, fill #1
  Filled 2023-05-11: qty 39, 86d supply, fill #0
  Filled 2023-05-11: qty 15, 33d supply, fill #2

## 2022-08-22 MED ORDER — VICTOZA 18 MG/3ML ~~LOC~~ SOPN
1.8000 mg | PEN_INJECTOR | Freq: Every day | SUBCUTANEOUS | 3 refills | Status: DC
  Filled 2022-08-22: qty 27, 90d supply, fill #0
  Filled 2023-06-03 (×2): qty 27, 90d supply, fill #1
  Filled 2023-06-03: qty 27, 90d supply, fill #0

## 2022-08-22 MED ORDER — LANTUS SOLOSTAR 100 UNIT/ML ~~LOC~~ SOPN
52.0000 [IU] | PEN_INJECTOR | Freq: Every day | SUBCUTANEOUS | 3 refills | Status: DC
  Filled 2022-08-22 – 2022-09-15 (×2): qty 45, 86d supply, fill #0

## 2022-08-22 MED ORDER — DEXCOM G7 SENSOR MISC: 1.0000 | 3 refills | Status: DC

## 2022-08-22 MED ORDER — PIOGLITAZONE HCL 15 MG PO TABS
15.0000 mg | ORAL_TABLET | Freq: Every day | ORAL | 3 refills | Status: DC
  Filled 2022-08-22: qty 90, 90d supply, fill #0
  Filled 2022-12-11: qty 90, 90d supply, fill #1
  Filled 2023-05-09: qty 90, 90d supply, fill #2
  Filled 2023-05-11: qty 90, 90d supply, fill #0
  Filled 2023-05-11: qty 90, 90d supply, fill #2

## 2022-08-22 MED ORDER — INSULIN PEN NEEDLE 32G X 4 MM MISC
1.0000 | Freq: Four times a day (QID) | 3 refills | Status: DC
  Filled 2022-08-22: qty 100, 25d supply, fill #0
  Filled 2022-11-16: qty 100, 25d supply, fill #1
  Filled 2022-12-12: qty 100, 25d supply, fill #2
  Filled 2023-01-09 (×2): qty 100, 25d supply, fill #3
  Filled 2023-04-27: qty 100, 25d supply, fill #4
  Filled 2023-04-28: qty 100, 25d supply, fill #0

## 2022-08-22 NOTE — Patient Instructions (Addendum)
-   Start Pioglitazone ( actos ) 15 mg daily  - Continue Jardiance 25 mg daily  - Continue Lantus /basaglar 52 units daily  - Decrease Humalog 12 units with EACH meal  - Humalog correctional insulin: ADD extra units on insulin to your meal-time Humalog dose if your blood sugars are higher than 160. Use the scale below to help guide you:   Blood sugar before meal Number of units to inject  Less than 160 0 unit  161 -  190 1 units  191 -  220 2 units  221 -  250 3 units  251 -  280 4 units  281 -  310 5 units  311 -  340 6 units  341 -  370 7 units  371 -  400 8 units       HOW TO TREAT LOW BLOOD SUGARS (Blood sugar LESS THAN 70 MG/DL) Please follow the RULE OF 15 for the treatment of hypoglycemia treatment (when your (blood sugars are less than 70 mg/dL)   STEP 1: Take 15 grams of carbohydrates when your blood sugar is low, which includes:  3-4 GLUCOSE TABS  OR 3-4 OZ OF JUICE OR REGULAR SODA OR ONE TUBE OF GLUCOSE GEL    STEP 2: RECHECK blood sugar in 15 MINUTES STEP 3: If your blood sugar is still low at the 15 minute recheck --> then, go back to STEP 1 and treat AGAIN with another 15 grams of carbohydrates.

## 2022-08-25 ENCOUNTER — Other Ambulatory Visit: Payer: Self-pay

## 2022-08-26 ENCOUNTER — Other Ambulatory Visit: Payer: Self-pay

## 2022-08-27 ENCOUNTER — Telehealth: Payer: Self-pay | Admitting: Internal Medicine

## 2022-08-27 NOTE — Telephone Encounter (Signed)
New Message:    Patient said she would like to talk to Emily Farmer please. She said she had talked to her a few weeks ago about her Monitor.

## 2022-08-27 NOTE — Telephone Encounter (Signed)
Left message to call the clinic. 

## 2022-08-27 NOTE — Telephone Encounter (Signed)
LVM for patient to call device clinic back 

## 2022-08-28 ENCOUNTER — Telehealth: Payer: Self-pay | Admitting: Internal Medicine

## 2022-08-28 NOTE — Telephone Encounter (Signed)
Returned call to patient. She wants to speak with Myrtie Hawk. She would like to bring the monitor in on the 19th when she comes in for a pacer check for assistance with it. Will routed to [facility] per patient request

## 2022-08-28 NOTE — Telephone Encounter (Signed)
Patient is requesting to speak with Sonia Baller. Patient did not state what it was in regards to. Patient did request for Sonia Baller to call her at [phone].

## 2022-08-28 NOTE — Telephone Encounter (Signed)
I spoke with the patient and she agreed to bring the monitor to the office on her next appointment with Dr. Lovena Le.

## 2022-08-29 ENCOUNTER — Other Ambulatory Visit: Payer: Self-pay

## 2022-08-29 ENCOUNTER — Ambulatory Visit (INDEPENDENT_AMBULATORY_CARE_PROVIDER_SITE_OTHER): Payer: Medicare HMO | Admitting: Podiatry

## 2022-08-29 DIAGNOSIS — E1149 Type 2 diabetes mellitus with other diabetic neurological complication: Secondary | ICD-10-CM | POA: Diagnosis not present

## 2022-08-29 DIAGNOSIS — B351 Tinea unguium: Secondary | ICD-10-CM

## 2022-08-29 DIAGNOSIS — M79674 Pain in right toe(s): Secondary | ICD-10-CM | POA: Diagnosis not present

## 2022-08-29 DIAGNOSIS — M79675 Pain in left toe(s): Secondary | ICD-10-CM | POA: Diagnosis not present

## 2022-08-29 DIAGNOSIS — I739 Peripheral vascular disease, unspecified: Secondary | ICD-10-CM | POA: Diagnosis not present

## 2022-08-29 MED ORDER — CICLOPIROX 8 % EX SOLN
Freq: Every day | CUTANEOUS | 0 refills | Status: DC
  Filled 2022-08-29: qty 6.6, 30d supply, fill #0

## 2022-08-29 NOTE — Patient Instructions (Signed)
Ciclopirox Topical Solution What is this medication? CICLOPIROX (sye kloe PEER ox) treats fungal infections of the nails. It belongs to a group of medications called antifungals. It will not treat infections caused by bacteria or viruses. This medicine may be used for other purposes; ask your health care provider or pharmacist if you have questions. COMMON BRAND NAME(S): Ciclodan Nail Solution, CNL8, Penlac What should I tell my care team before I take this medication? They need to know if you have any of these conditions: Diabetes (high blood sugar) Immune system problems Organ transplant Receiving steroid inhalers, cream, or lotion Seizures Tingling of the fingers or toes or other nerve disorder An unusual or allergic reaction to ciclopirox, other medications, foods, dyes, or preservatives Pregnant or trying to get pregnant Breast-feeding How should I use this medication? This medication is for external use only. Do not take by mouth. Wash your hands before and after use. If you are treating your hands, only wash your hands before use. Do not get it in your eyes. If you do, rinse your eyes with plenty of cool tap water. Use it as directed on the prescription label at the same time every day. Do not use it more often than directed. Use the medication for the full course as directed by your care team, even if you think you are better. Do not stop using it unless your care team tells you to stop it early. Apply a thin film of the medication to the affected area. Talk to your care team about the use of this medication in children. While it may be prescribed for children as young as 12 years for selected conditions, precautions do apply. Overdosage: If you think you have taken too much of this medicine contact a poison control center or emergency room at once. NOTE: This medicine is only for you. Do not share this medicine with others. What if I miss a dose? If you miss a dose, use it as soon as  you can. If it is almost time for your next dose, use only that dose. Do not use double or extra doses. What may interact with this medication? Interactions are not expected. Do not use any other skin products without telling your care team. This list may not describe all possible interactions. Give your health care provider a list of all the medicines, herbs, non-prescription drugs, or dietary supplements you use. Also tell them if you smoke, drink alcohol, or use illegal drugs. Some items may interact with your medicine. What should I watch for while using this medication? Visit your care team for regular checks on your progress. It may be some time before you see the benefit from this medication. Do not use nail polish or other nail cosmetic products on the treated nails. Removal of the unattached, infected nail by your care team is needed with use of this medication. If you have diabetes or numbness in your fingers or toes, talk to your care team about proper nail care. What side effects may I notice from receiving this medication? Side effects that you should report to your care team as soon as possible: Allergic reactions--skin rash, itching, hives, swelling of the face, lips, tongue, or throat Burning, itching, crusting, or peeling of treated skin Side effects that usually do not require medical attention (report to your care team if they continue or are bothersome): Change in nail shape, thickness, or color Mild skin irritation, redness, or dryness This list may not describe all possible side   effects. Call your doctor for medical advice about side effects. You may report side effects to FDA at 1-800-FDA-1088. Where should I keep my medication? Keep out of the reach of children and pets. Store at room temperature between 20 and 25 degrees C (68 and 77 degrees F). This medication is flammable. Avoid exposure to heat, fire, flame, and smoking. Get rid of medications that are no longer needed  or have expired: Take the medication to a medication take-back program. Check with your pharmacy or law enforcement to find a location. If you cannot return the medication, check the label or package insert to see if the medication should be thrown out in the garbage or flushed down the toilet. If you are not sure, ask your care team. If it is safe to put in the trash, take the medication out of the container. Mix the medication with cat litter, dirt, coffee grounds, or other unwanted substance. Seal the mixture in a bag or container. Put it in the trash. NOTE: This sheet is a summary. It may not cover all possible information. If you have questions about this medicine, talk to your doctor, pharmacist, or health care provider.  2023 Elsevier/Gold Standard (2021-05-14 00:00:00)  

## 2022-08-29 NOTE — Progress Notes (Signed)
Subjective:   Patient ID: Emily Farmer, female   DOB: 67 y.o.   MRN: [id number]   HPI Chief Complaint  Patient presents with   Foot Problem    Bilateral feet have neuropathy and thickened long right great toe with yellow discoloration and hematoma    67 year old female presents the office today for concerns.  She states this started about 1 week ago. She tried to cut her today but she was not able to cut the right big toenail due to the toenail.   The numbness comes and goes. She states she was diagnosed with neuropathy on 08/22/2022. No weakness or falls. It stays just in the feet, sometimes the big toe will hurt.   No recent medication changes.   No injuries or falls.   Last A1c 9.5 on 04/29/2023     ROS      Objective:  Physical Exam  General: AAO x3, NAD  Dermatological: Nails are hypertrophic, dystrophic, brittle, discolored, elongated 10. No surrounding redness or drainage. Tenderness nails 1-5 bilaterally. No open lesions or pre-ulcerative lesions are identified today.  No open lesions.   Vascular: Dorsalis Pedis artery and Posterior Tibial artery pedal pulses are decreased bilateral with immedate capillary fill time. There is no pain with calf compression, swelling, warmth, erythema.   Neruologic: Sensation decreased with Semmes Weinstein monofilament  Musculoskeletal: No other areas of joint tenderness noted.      Assessment:    67 year old female with symptomatic onychomycosis, neuropathy     Plan:  -Treatment options discussed including all alternatives, risks, and complications -Etiology of symptoms were discussed -Nails debrided 10 without complications or bleeding. Prescribed penlac. -ABI given decreased pulses -Discussed medication options for neuropathy.  However discussed with numbness medications; help with this. -Daily foot inspection -Glucose control -Follow-up in 3 months or sooner if any problems arise. In the meantime, encouraged to call  the office with any questions, concerns, change in symptoms.   Celesta Gentile, DPM

## 2022-09-01 ENCOUNTER — Other Ambulatory Visit: Payer: Self-pay

## 2022-09-02 ENCOUNTER — Ambulatory Visit: Payer: Medicaid Other | Admitting: Internal Medicine

## 2022-09-02 DIAGNOSIS — E1165 Type 2 diabetes mellitus with hyperglycemia: Secondary | ICD-10-CM | POA: Diagnosis not present

## 2022-09-08 ENCOUNTER — Ambulatory Visit (HOSPITAL_COMMUNITY)
Admission: RE | Admit: 2022-09-08 | Discharge: 2022-09-08 | Disposition: A | Discharge status: Home / Self Care | Payer: Medicare HMO | Source: Ambulatory Visit | Attending: Podiatry | Admitting: Podiatry

## 2022-09-08 DIAGNOSIS — I739 Peripheral vascular disease, unspecified: Secondary | ICD-10-CM | POA: Diagnosis not present

## 2022-09-08 LAB — VAS US ABI WITH/WO TBI
Left ABI: 1.33
Right ABI: 0.91

## 2022-09-10 ENCOUNTER — Other Ambulatory Visit: Payer: Self-pay | Admitting: Podiatry

## 2022-09-10 DIAGNOSIS — I739 Peripheral vascular disease, unspecified: Secondary | ICD-10-CM

## 2022-09-12 ENCOUNTER — Other Ambulatory Visit: Payer: Self-pay

## 2022-09-12 ENCOUNTER — Other Ambulatory Visit: Payer: Medicare HMO

## 2022-09-15 ENCOUNTER — Other Ambulatory Visit: Payer: Self-pay

## 2022-09-15 ENCOUNTER — Ambulatory Visit: Payer: Medicare HMO | Attending: Internal Medicine | Admitting: Pharmacist

## 2022-09-15 DIAGNOSIS — E1165 Type 2 diabetes mellitus with hyperglycemia: Secondary | ICD-10-CM

## 2022-09-15 DIAGNOSIS — Z7985 Long-term (current) use of injectable non-insulin antidiabetic drugs: Secondary | ICD-10-CM | POA: Diagnosis not present

## 2022-09-15 DIAGNOSIS — I10 Essential (primary) hypertension: Secondary | ICD-10-CM | POA: Insufficient documentation

## 2022-09-15 DIAGNOSIS — Z794 Long term (current) use of insulin: Secondary | ICD-10-CM

## 2022-09-15 DIAGNOSIS — E785 Hyperlipidemia, unspecified: Secondary | ICD-10-CM | POA: Diagnosis not present

## 2022-09-15 DIAGNOSIS — Z7984 Long term (current) use of oral hypoglycemic drugs: Secondary | ICD-10-CM | POA: Diagnosis not present

## 2022-09-15 DIAGNOSIS — I480 Paroxysmal atrial fibrillation: Secondary | ICD-10-CM | POA: Diagnosis not present

## 2022-09-15 DIAGNOSIS — E119 Type 2 diabetes mellitus without complications: Secondary | ICD-10-CM | POA: Diagnosis not present

## 2022-09-15 DIAGNOSIS — E162 Hypoglycemia, unspecified: Secondary | ICD-10-CM | POA: Insufficient documentation

## 2022-09-15 NOTE — Progress Notes (Signed)
S:    PCP: Dr. Wynetta Emery  67 y.o. female who presents for diabetes evaluation, education, and management.  PMH is significant for HTN, PAF, T2DM, and HLD. Patient was last seen by Primary Care Provider, Dr. Wynetta Emery, on 07/21/22. Last seen by pharmacy clinic on 07/08/2022 and last seen by endocrinology on 08/22/2022. At endocrinology visit, A1c was elevated at 10.2% and many medication changes were made. Humalog was decreased from 16 units to 12 units TID and a correctional scale was added. Victoza was increased to 1.8 mg daily and pioglitazone was started.    Today, patient arrives in good spirits and presents without any assistance. Patient states she has received her CGM and plans to have it placed at her nutrition visit on 10/10/22. Of note, she has not been taking her Humalog as prescribed. She has had difficulty adhering to the correctional scale as she rarely uses the chart provided at her endocrinology visit. She also notoriously takes her sisters insulin instead of picking up Lantus from the pharmacy. Strongly encouraged patient to pick up Lantus from downstairs as she should not be taking medications that are not prescribed to her. She denies GI side effects with the increased Victoza dose.   Family Hx: diabetes (sister); heart attack (mom and dad) Social Hx: Denies use of tobacco and alcohol.  Current diabetes medications include: Lantus 52 units QPM, Humalog 12 units TID +  correctional factor (0-8 units), Jardiance 25 mg daily, Victoza 1.8 mg daily, pioglitazone 15 mg daily    Insurance coverage: Medicare/Medicaid  Patient denies hypoglycemic events since last endocrinology visit.  Patient denies nocturia (nighttime urination).  Patient denies neuropathy (nerve pain). Patient reports visual changes. Patient reports self foot exams.   Patient reported dietary habits:  -Breakfast: scrambled eggs, 2 slices of bread, cheese  -Snacks: popcorn, low sugar ice cream -Dinner: ham and  scallop potatoes -Beverages: water, drinks lemonade   Patient-reported exercise habits: Endorses walking for 1 hour daily.  O:  Brings home meter for review:  This morning 114 7 day average: 229 14 day average: 270 30 day average: 253   Lab Results  Component Value Date   HGBA1C 10.2 (A) 08/22/2022   There were no vitals filed for this visit.  Lipid Panel     Component Value Date/Time   CHOL 436 (H) 06/19/2022 1156   TRIG 1,149 (HH) 06/19/2022 1156   HDL 37 (L) 06/19/2022 1156   CHOLHDL 11.8 (H) 06/19/2022 1156   CHOLHDL 4.0 04/21/2021 0143   VLDL 53 (H) 04/21/2021 0143   LDLCALC Comment (A) 06/19/2022 1156   LDLDIRECT 137 (H) 03/01/2020 1012   LDLDIRECT 96.0 09/23/2019 0828    Clinical Atherosclerotic Cardiovascular Disease (ASCVD): No  The ASCVD Risk score (Arnett DK, et al., 2019) failed to calculate for the following reasons:   The valid total cholesterol range is 130 to 320 mg/dL   A/P: Diabetes longstanding currently uncontrolled. Patient is able to verbalize appropriate hypoglycemia management plan. Medication adherence continues to need improvement, especially regarding meal time insulin. FBG at goal today (114), but continues to have post prandial excursions likely due to dietary indiscretions and non adherence. Will plan to see patient again in 6 weeks after she has CGM data to share.  -Continued current regimen. Reviewed many times how to correctly administer meal time insulin with correctional factor. She denies GI side effects since increasing Victoza.  -Continued pioglitazone as prescribed by endocrinology. Of note, patient does have history of HF (EF 55-60%  in Sept 2023). Patient denies symptoms of fluid overload/edema. Instructed patient to immediately report any changes. Will defer management to endocrinology.  -Extensively discussed pathophysiology of diabetes, recommended lifestyle interventions, dietary effects on blood sugar control.  -Counseled on s/sx  of and management of hypoglycemia.  -Next A1c anticipated 11/2022.   Written patient instructions provided. Patient verbalized understanding of treatment plan.  Total time in face to face counseling 30 minutes.    Follow-up:  Pharmacist 6 weeks, after patient has been on CGM for a few weeks PCP: June 2024 Endocrinology: September 2024  Joseph Art, Sherian Rein.D. PGY-2 Ambulatory Care Pharmacy Resident

## 2022-10-10 ENCOUNTER — Encounter: Payer: Self-pay | Admitting: Dietician

## 2022-10-10 ENCOUNTER — Encounter: Payer: Medicare HMO | Attending: Internal Medicine | Admitting: Dietician

## 2022-10-10 VITALS — Ht 63.0 in | Wt 152.0 lb

## 2022-10-10 DIAGNOSIS — Z794 Long term (current) use of insulin: Secondary | ICD-10-CM | POA: Diagnosis not present

## 2022-10-10 DIAGNOSIS — E1142 Type 2 diabetes mellitus with diabetic polyneuropathy: Secondary | ICD-10-CM | POA: Insufficient documentation

## 2022-10-10 NOTE — Progress Notes (Signed)
Diabetes Self-Management Education  Visit Type: First/Initial  Appt. Start Time: 1430 Appt. End Time: 1500  10/10/2022  [name], identified by name and date of birth, is a 67 y.o. female with a diagnosis of Diabetes: Type 2.   ASSESSMENT Patient is here today alone.  She last saw a diabetes educator in 2021. She would like to learn how to use the Dexcom G7  -Getting to know device    (Receiver) -Setting up device (high alert  350  , low alert 70  ) -Inserting sensor ( left upper arm   WNL) -Ending sensor session -Reviewed insulin dosing from dexcom.   Emotional eating but states that she has been drinking more water recently. Reports that fasting blood glucose was 465 this am.  Referral for Type 2 Diabetes  History includes:  Type 2 Diabetes (2009), HLD, HTN, CHF, edentulous Medications include:  Jardiance, victoza, actos, Lantus 52 units q HS, Humalog 12 units before meals, iron, MVI Labs noted to include A1C 10.2% 08/22/2022 increased from 9.5% 04/28/2022, eGFR 55 03/27/2022 Lipid Panel     Component Value Date/Time   CHOL 436 (H) 06/19/2022 1156   TRIG 1,149 (HH) 06/19/2022 1156   HDL 37 (L) 06/19/2022 1156   CHOLHDL 11.8 (H) 06/19/2022 1156   CHOLHDL 4.0 04/21/2021 0143   VLDL 53 (H) 04/21/2021 0143   LDLCALC Comment (A) 06/19/2022 1156   LDLDIRECT 137 (H) 03/01/2020 1012   LDLDIRECT 96.0 09/23/2019 0828   LABVLDL Comment (A) 06/19/2022 1156   152 lbs 10/10/22 189 lbs 2010 Patient lives with her sister.  Patient does the shopping and cooking.  She does not drive and takes the bus.  She is retired.  Height 5\' 3"  (1.6 m), weight 152 lb (68.9 kg). Body mass index is 26.93 kg/m.   Diabetes Self-Management Education - 10/10/22 1437       Visit Information   Visit Type First/Initial      Initial Visit   Diabetes Type Type 2    Date Diagnosed 2009    Are you currently following a meal plan? No    Are you taking your medications as prescribed? Yes       Health Coping   How would you rate your overall health? Fair      Psychosocial Assessment   Patient Belief/Attitude about Diabetes Motivated to manage diabetes    What is the hardest part about your diabetes right now, causing you the most concern, or is the most worrisome to you about your diabetes?   Checking blood sugar    Self-care barriers None    Self-management support Doctor's office    Other persons present Patient    Patient Concerns Nutrition/Meal planning;Monitoring    Special Needs None    Preferred Learning Style No preference indicated    Learning Readiness Ready    How often do you need to have someone help you when you read instructions, pamphlets, or other written materials from your doctor or pharmacy? 3 - Sometimes    What is the last grade level you completed in school? 6      Pre-Education Assessment   Patient understands the diabetes disease and treatment process. Needs Review    Patient understands incorporating nutritional management into lifestyle. Needs Review    Patient undertands incorporating physical activity into lifestyle. Needs Review    Patient understands using medications safely. Needs Review    Patient understands monitoring blood glucose, interpreting and using results Needs Review  Patient understands prevention, detection, and treatment of acute complications. Needs Review    Patient understands prevention, detection, and treatment of chronic complications. Needs Review    Patient understands how to develop strategies to address psychosocial issues. Needs Review    Patient understands how to develop strategies to promote health/change behavior. Needs Review      Complications   Last HgB A1C per patient/outside source 10.2 %   08/22/22 increased from 9.5% 04/28/22   How often do you check your blood sugar? > 4 times/day    Fasting Blood glucose range (mg/dL) >161   096 this am   Number of hypoglycemic episodes per month 0      Dietary Intake    Breakfast eggs, bacon, 1 English muffin    Lunch fish sticks    Snack (afternoon) mini popcorn    Dinner sausage, stirfried vegetables, rice    Beverage(s) water, 2% milk      Activity / Exercise   Activity / Exercise Type Light (walking / raking leaves)    How many days per week do you exercise? 2    How many minutes per day do you exercise? 60    Total minutes per week of exercise 120      Patient Education   Previous Diabetes Education Yes (please comment)   2021   Healthy Eating Role of diet in the treatment of diabetes and the relationship between the three main macronutrients and blood glucose level    Being Active Role of exercise on diabetes management, blood pressure control and cardiac health.    Medications Reviewed patients medication for diabetes, action, purpose, timing of dose and side effects.    Monitoring Taught/evaluated CGM (comment)    Acute complications Taught prevention, symptoms, and  treatment of hypoglycemia - the 15 rule.;Discussed and identified patients' prevention, symptoms, and treatment of hyperglycemia.    Diabetes Stress and Support Identified and addressed patients feelings and concerns about diabetes;Worked with patient to identify barriers to care and solutions      Individualized Goals (developed by patient)   Nutrition General guidelines for healthy choices and portions discussed    Physical Activity Exercise 5-7 days per week;30 minutes per day    Medications take my medication as prescribed    Monitoring  Consistenly use CGM    Problem Solving Eating Pattern    Reducing Risk examine blood glucose patterns;treat hypoglycemia with 15 grams of carbs if blood glucose less than 70mg /dL      Post-Education Assessment   Patient understands the diabetes disease and treatment process. Comprehends key points    Patient understands incorporating nutritional management into lifestyle. Comprehends key points    Patient undertands incorporating physical  activity into lifestyle. Comprehends key points    Patient understands using medications safely. Comphrehends key points    Patient understands monitoring blood glucose, interpreting and using results Comprehends key points    Patient understands prevention, detection, and treatment of acute complications. Comprehends key points    Patient understands prevention, detection, and treatment of chronic complications. Comprehends key points    Patient understands how to develop strategies to address psychosocial issues. Comprehends key points    Patient understands how to develop strategies to promote health/change behavior. Comprehends key points      Outcomes   Expected Outcomes Demonstrated interest in learning. Expect positive outcomes    Future DMSE PRN    Program Status Completed  Individualized Plan for Diabetes Self-Management Training:   Learning Objective:  Patient will have a greater understanding of diabetes self-management. Patient education plan is to attend individual and/or group sessions per assessed needs and concerns.   Plan:   Patient Instructions  Continue to take your medications as prescribed. Stay active.  Try to walk every day. Drink plenty of water (especially when your blood sugar is high) Treat a low blood sugar (less than 70) with 4 glucose tabs or 1/2 cup juice or regular soda.   Expected Outcomes:  Demonstrated interest in learning. Expect positive outcomes  Education material provided: hypoglycemia and hyperglycemia symptoms and treatment  If problems or questions, patient to contact team via:  Phone  Future DSME appointment: PRN

## 2022-10-10 NOTE — Patient Instructions (Signed)
Continue to take your medications as prescribed. Stay active.  Try to walk every day. Drink plenty of water (especially when your blood sugar is high) Treat a low blood sugar (less than 70) with 4 glucose tabs or 1/2 cup juice or regular soda.

## 2022-10-14 ENCOUNTER — Encounter: Payer: Self-pay | Admitting: Cardiovascular Disease

## 2022-10-14 ENCOUNTER — Ambulatory Visit: Payer: Medicare HMO | Attending: Cardiovascular Disease | Admitting: Cardiovascular Disease

## 2022-10-14 VITALS — BP 96/62 | HR 92 | Ht 63.0 in | Wt 157.0 lb

## 2022-10-14 DIAGNOSIS — I739 Peripheral vascular disease, unspecified: Secondary | ICD-10-CM | POA: Diagnosis not present

## 2022-10-14 NOTE — Patient Instructions (Signed)
Medication Instructions:  Your physician recommends that you continue on your current medications as directed. Please refer to the Current Medication list given to you today.  *If you need a refill on your cardiac medications before your next appointment, please call your pharmacy*   Testing/Procedures: Your physician has requested that you have a lower extremity arterial duplex. During this test, ultrasound is used to evaluate arterial blood flow in the legs. Allow one hour for this exam. There are no restrictions or special instructions. This will take place at [address], [address].    Follow-Up: At [facility], you and your health needs are our priority.  As part of our continuing mission to provide you with exceptional heart care, we have created designated Provider Care Teams.  These Care Teams include your primary Cardiologist (physician) and Advanced Practice Providers (APPs -  Physician Assistants and Nurse Practitioners) who all work together to provide you with the care you need, when you need it.  We recommend signing up for the patient portal called "MyChart".  Sign up information is provided on this After Visit Summary.  MyChart is used to connect with patients for Virtual Visits (Telemedicine).  Patients are able to view lab/test results, encounter notes, upcoming appointments, etc.  Non-urgent messages can be sent to your provider as well.   To learn more about what you can do with MyChart, go to [email or web address].    Your next appointment:   We will see you on an as needed basis.   Provider:   Jonathan Berry, MD 

## 2022-10-14 NOTE — Progress Notes (Signed)
10/14/2022 [name]   1955-10-15  [id number]  Primary Physician Marcine Matar, MD Primary Cardiologist: Runell Gess MD Nicholes Calamity, [place]  HPI:  Emily Farmer is a 67 y.o. mildly overweight single Caucasian female mother of 1 son who apparently has autism, and was referred by Dr. Ardelle Anton, her podiatrist, for peripheral vascular evaluation because onycholysis on her toenails.  She has seen Dr. Rennis Golden and Dr. Ladona Ridgel in the past for general cardiology, hyperlipidemia and atrial fibrillation.  Other problems include history of heart failure with preserved EF, treated hypertension, diabetes and hyperlipidemia.  She is never had a heart attack or stroke.  She does have pain in her legs but it is hard to determine whether this is claudication.  She gets occasional back and jaw pain.  She has onycholysis on several of her toes on her right foot and 1 on her left foot.  She had Doppler studies performed in our office 09/08/2022 revealing a right ABI of 0.91 with a TBI of 0.58 and a left of 1.33 with a TBI of 0.69.   Current Meds  Medication Sig   acetaminophen (TYLENOL) 325 MG tablet Take 2 tablets (650 mg total) by mouth every 6 (six) hours as needed for mild pain (or Fever >/= 101).   apixaban (ELIQUIS) 5 MG TABS tablet Take 1 tablet (5 mg total) by mouth 2 (two) times daily. Resume taking on 9/24 with evening dose.   atorvastatin (LIPITOR) 40 MG tablet Take 1 tablet (40 mg total) by mouth daily.   Blood Glucose Monitoring Suppl (TRUE METRIX METER) w/Device KIT Use to monitor blood sugar as directed (Patient taking differently: 1 each by Other route See admin instructions. Use to monitor blood sugar as directed)   Blood Pressure Monitoring (BLOOD PRESSURE MONITOR/ARM) DEVI Check blood pressure once per day at least 2 hours after medications.   ciclopirox (PENLAC) 8 % solution Apply topically at bedtime. Apply over nail and surrounding skin. Apply daily over previous coat. After  seven (7) days, may remove with alcohol and continue cycle.   Continuous Blood Gluc Sensor (DEXCOM G7 SENSOR) MISC 1 Device by Does not apply route as directed.   diclofenac Sodium (VOLTAREN) 1 % GEL Apply 2 g topically 4 (four) times daily. Apply across lower back for back pain (Patient taking differently: Apply 2 g topically 4 (four) times daily as needed (back pain).)   digoxin (LANOXIN) 0.125 MG tablet Take 1 tablet (0.125 mg total) by mouth daily.   diltiazem (CARDIZEM CD) 120 MG 24 hr capsule Take 1 capsule (120 mg total) by mouth daily.   empagliflozin (JARDIANCE) 25 MG TABS tablet Take 1 tablet (25 mg total) by mouth daily before breakfast.   ezetimibe (ZETIA) 10 MG tablet Take 1 tablet (10 mg total) by mouth daily.   ferrous sulfate 325 (65 FE) MG tablet Take 1 tablet (325 mg total) by mouth daily with breakfast.   glucose blood (TRUE METRIX BLOOD GLUCOSE TEST) test strip Use as instructed   insulin glargine (LANTUS SOLOSTAR) 100 UNIT/ML Solostar Pen Inject 52 Units into the skin daily.   insulin lispro (HUMALOG KWIKPEN) 100 UNIT/ML KwikPen Max daily 45 units   Insulin Pen Needle 32G X 4 MM MISC use in the morning, at noon, in the evening, and at bedtime.   Insulin Syringe-Needle U-100 (TRUEPLUS INSULIN SYRINGE) 31G X 5/16" 0.3 ML MISC Use to inject insulin daily. (Patient taking differently: 1 each by Other route See admin instructions.  Use to inject insulin daily.)   liraglutide (VICTOZA) 18 MG/3ML SOPN Inject 1.8 mg into the skin daily.   metoprolol succinate (TOPROL-XL) 100 MG 24 hr tablet Take 1 tablet (100 mg total) by mouth daily. Take with or immediately following a meal.   Multiple Vitamins-Minerals (CENTRUM SILVER PO) Take 1 tablet by mouth daily.   pioglitazone (ACTOS) 15 MG tablet Take 1 tablet (15 mg total) by mouth daily.   TRUEplus Lancets 28G MISC USE TO CHECK BLOOD SUGAR 3 TIMES DAILY     Allergies  Allergen Reactions   Trulicity [Dulaglutide] Diarrhea    Social  History   Socioeconomic History   Marital status: Single    Spouse name: Not on file   Number of children: Not on file   Years of education: Not on file   Highest education level: Not on file  Occupational History   Not on file  Tobacco Use   Smoking status: Never   Smokeless tobacco: Never  Vaping Use   Vaping Use: Never used  Substance and Sexual Activity   Alcohol use: No   Drug use: No   Sexual activity: Not Currently  Other Topics Concern   Not on file  Social History Narrative   Lives with her sister and does not need any assistance with ADLs.     Social Determinants of Health   Financial Resource Strain: Medium Risk (09/15/2022)   Overall Financial Resource Strain (CARDIA)    Difficulty of Paying Living Expenses: Somewhat hard  Food Insecurity: Food Insecurity Present (09/15/2022)   Hunger Vital Sign    Worried About Running Out of Food in the Last Year: Sometimes true    Ran Out of Food in the Last Year: Often true  Transportation Needs: No Transportation Needs (09/15/2022)   PRAPARE - Administrator, Civil Service (Medical): No    Lack of Transportation (Non-Medical): No  Physical Activity: Insufficiently Active (09/15/2022)   Exercise Vital Sign    Days of Exercise per Week: 4 days    Minutes of Exercise per Session: 10 min  Stress: Stress Concern Present (07/21/2022)   [employer] of Occupational Health - Occupational Stress Questionnaire    Feeling of Stress : Very much  Social Connections: Socially Isolated (09/15/2022)   Social Connection and Isolation Panel [NHANES]    Frequency of Communication with Friends and Family: Once a week    Frequency of Social Gatherings with Friends and Family: More than three times a week    Attends Religious Services: Never    Database administrator or Organizations: No    Attends Banker Meetings: Never    Marital Status: Never married  Intimate Partner Violence: Not At Risk (09/15/2022)   Humiliation,  Afraid, Rape, and Kick questionnaire    Fear of Current or Ex-Partner: No    Emotionally Abused: No    Physically Abused: No    Sexually Abused: No     Review of Systems: General: negative for chills, fever, night sweats or weight changes.  Cardiovascular: negative for chest pain, dyspnea on exertion, edema, orthopnea, palpitations, paroxysmal nocturnal dyspnea or shortness of breath Dermatological: negative for rash Respiratory: negative for cough or wheezing Urologic: negative for hematuria Abdominal: negative for nausea, vomiting, diarrhea, bright red blood per rectum, melena, or hematemesis Neurologic: negative for visual changes, syncope, or dizziness All other systems reviewed and are otherwise negative except as noted above.    Blood pressure 96/62, pulse 92, height 5\' 3"  (  1.6 m), weight 157 lb (71.2 kg), SpO2 95 %.  General appearance: alert and no distress Neck: no adenopathy, no carotid bruit, no JVD, supple, symmetrical, trachea midline, and thyroid not enlarged, symmetric, no tenderness/mass/nodules Lungs: clear to auscultation bilaterally Heart: irregularly irregular rhythm Extremities: extremities normal, atraumatic, no cyanosis or edema Pulses: Diminished pedal pulses Skin: Onycholysis on her first and second right toenail at her left first toenail Neurologic: Grossly normal  EKG atrial fibrillation with a ventricular sponsor of 92 and nonspecific ST and T wave changes.  Personally reviewed this EKG.  ASSESSMENT AND PLAN:   Peripheral arterial disease (HCC) Ms. Goddard was referred to me by Dr. Ardelle Anton for evaluation of PAD.  She does have onycholysis on her right first and second toes and her left first toe.  It is difficult to determine whether or not she has claudication.  She had Doppler studies performed 09/08/2022 revealing a right ABI of 0.91 and a left of 1.33.  Her TBI's were 0.58 on the right and 0.69 on the left.  I am going to get lower extremity arterial  Doppler studies to further evaluate.     Runell Gess MD FACP,FACC,FAHA, [facility] 10/14/2022 1:58 PM

## 2022-10-14 NOTE — Assessment & Plan Note (Signed)
Emily Farmer was referred to me by Dr. Ardelle Anton for evaluation of PAD.  She does have onycholysis on her right first and second toes and her left first toe.  It is difficult to determine whether or not she has claudication.  She had Doppler studies performed 09/08/2022 revealing a right ABI of 0.91 and a left of 1.33.  Her TBI's were 0.58 on the right and 0.69 on the left.  I am going to get lower extremity arterial Doppler studies to further evaluate.

## 2022-10-20 ENCOUNTER — Other Ambulatory Visit: Payer: Self-pay

## 2022-10-21 ENCOUNTER — Other Ambulatory Visit: Payer: Self-pay

## 2022-10-22 ENCOUNTER — Other Ambulatory Visit: Payer: Self-pay

## 2022-10-22 DIAGNOSIS — E113593 Type 2 diabetes mellitus with proliferative diabetic retinopathy without macular edema, bilateral: Secondary | ICD-10-CM | POA: Diagnosis not present

## 2022-10-22 DIAGNOSIS — Z961 Presence of intraocular lens: Secondary | ICD-10-CM | POA: Diagnosis not present

## 2022-10-22 DIAGNOSIS — H40023 Open angle with borderline findings, high risk, bilateral: Secondary | ICD-10-CM | POA: Diagnosis not present

## 2022-10-22 LAB — HM DIABETES EYE EXAM

## 2022-10-23 ENCOUNTER — Ambulatory Visit: Payer: Medicaid Other | Admitting: Pharmacist

## 2022-10-28 ENCOUNTER — Ambulatory Visit (HOSPITAL_COMMUNITY)
Admission: RE | Admit: 2022-10-28 | Discharge: 2022-10-28 | Disposition: A | Discharge status: Home / Self Care | Payer: Medicare HMO | Source: Ambulatory Visit | Attending: Cardiology | Admitting: Cardiology

## 2022-10-28 DIAGNOSIS — I739 Peripheral vascular disease, unspecified: Secondary | ICD-10-CM | POA: Diagnosis not present

## 2022-10-28 LAB — VAS US ABI WITH/WO TBI

## 2022-10-29 ENCOUNTER — Encounter: Payer: Self-pay | Admitting: Internal Medicine

## 2022-10-29 DIAGNOSIS — E113593 Type 2 diabetes mellitus with proliferative diabetic retinopathy without macular edema, bilateral: Secondary | ICD-10-CM | POA: Insufficient documentation

## 2022-10-29 LAB — VAS US ABI WITH/WO TBI

## 2022-10-31 ENCOUNTER — Other Ambulatory Visit: Payer: Self-pay

## 2022-11-03 ENCOUNTER — Other Ambulatory Visit: Payer: Self-pay

## 2022-11-04 ENCOUNTER — Ambulatory Visit: Payer: Medicaid Other | Admitting: Pharmacist

## 2022-11-04 DIAGNOSIS — H35033 Hypertensive retinopathy, bilateral: Secondary | ICD-10-CM | POA: Diagnosis not present

## 2022-11-04 DIAGNOSIS — E113513 Type 2 diabetes mellitus with proliferative diabetic retinopathy with macular edema, bilateral: Secondary | ICD-10-CM | POA: Diagnosis not present

## 2022-11-07 ENCOUNTER — Encounter: Payer: Self-pay | Admitting: Pharmacist

## 2022-11-07 ENCOUNTER — Ambulatory Visit: Payer: Medicare HMO | Attending: Family Medicine | Admitting: Pharmacist

## 2022-11-07 ENCOUNTER — Other Ambulatory Visit: Payer: Self-pay

## 2022-11-07 DIAGNOSIS — Z794 Long term (current) use of insulin: Secondary | ICD-10-CM

## 2022-11-07 DIAGNOSIS — E1165 Type 2 diabetes mellitus with hyperglycemia: Secondary | ICD-10-CM

## 2022-11-07 DIAGNOSIS — I4819 Other persistent atrial fibrillation: Secondary | ICD-10-CM

## 2022-11-07 MED ORDER — APIXABAN 5 MG PO TABS
5.0000 mg | ORAL_TABLET | Freq: Two times a day (BID) | ORAL | 1 refills | Status: DC
Start: 2022-11-07 — End: 2023-07-21
  Filled 2022-11-07: qty 180, 90d supply, fill #0
  Filled 2023-04-27: qty 180, 90d supply, fill #1
  Filled 2023-04-28: qty 180, 90d supply, fill #0

## 2022-11-07 NOTE — Progress Notes (Signed)
    S:    PCP: Dr. Laural Benes  67 y.o. female who presents for diabetes evaluation, education, and management.  PMH is significant for HTN, PAF, T2DM, and HLD. Patient was last seen by Primary Care Provider, Dr. Laural Benes, on 07/21/22. Last seen by pharmacy clinic on 09/15/2022.   Today, patient arrives in good spirits and presents without any assistance. She is now using her Dexcom G7 consistently and brings this for review today.   Family Hx: diabetes (sister); heart attack (mom and dad) Social Hx: Denies use of tobacco and alcohol.  Current diabetes medications include: Lantus 52 units QPM, Humalog 12 units TID (most of the time uses ~3-4 units), Jardiance 25 mg daily, Victoza 1.8 mg daily, pioglitazone 15 mg daily    Insurance coverage: [employer]  Patient denies hypoglycemic events since last endocrinology visit.  Patient denies nocturia (nighttime urination).  Patient denies neuropathy (nerve pain). Patient reports visual changes. Patient reports self foot exams.   Patient reported dietary habits:  -Breakfast: scrambled eggs, 2 slices of bread, cheese  -Snacks: popcorn, low sugar ice cream -Dinner: ham and scallop potatoes -Beverages: water, drinks lemonade   Patient-reported exercise habits:  -Endorses walking for 1 hour daily.  O:  CGM data:  3 day report:  Avg: 195  In range: 52% 47% above (25% >250) No hypoglycemia. Relatively, her CBG ranges are betting overnight and in the morning. They trend higher throughout the day after meals. She is still having some trouble with limiting carbohydrates but is doing what she can.  Lab Results  Component Value Date   HGBA1C 10.2 (A) 08/22/2022   There were no vitals filed for this visit.  Lipid Panel     Component Value Date/Time   CHOL 436 (H) 06/19/2022 1156   TRIG 1,149 (HH) 06/19/2022 1156   HDL 37 (L) 06/19/2022 1156   CHOLHDL 11.8 (H) 06/19/2022 1156   CHOLHDL 4.0 04/21/2021 0143   VLDL 53 (H) 04/21/2021 0143    LDLCALC Comment (A) 06/19/2022 1156   LDLDIRECT 137 (H) 03/01/2020 1012   LDLDIRECT 96.0 09/23/2019 0828    Clinical Atherosclerotic Cardiovascular Disease (ASCVD): No  The ASCVD Risk score (Arnett DK, et al., 2019) failed to calculate for the following reasons:   The valid total cholesterol range is 130 to 320 mg/dL   A/P: Diabetes longstanding currently uncontrolled. CGM data seems to show gradual improvement. Her avgs seem to be coming down. Patient is able to verbalize appropriate hypoglycemia management plan. Medication adherence reported. -Continued current regimen.  -Continued pioglitazone as prescribed by endocrinology. Of note, patient does have history of HF (EF 55-60% in Sept 2023). Patient denies symptoms of fluid overload/edema. Instructed patient to immediately report any changes. Will defer management to endocrinology.  -Extensively discussed pathophysiology of diabetes, recommended lifestyle interventions, dietary effects on blood sugar control.  -Counseled on s/sx of and management of hypoglycemia.  -Next A1c anticipated 11/2022.   Written patient instructions provided. Patient verbalized understanding of treatment plan.  Total time in face to face counseling 30 minutes.    Follow-up:  Me: plan for July PCP: June 2024 Endocrinology: September 2024  Butch Penny, PharmD, Patsy [place], CPP Clinical Pharmacist [facility] [phone]

## 2022-11-17 ENCOUNTER — Other Ambulatory Visit: Payer: Self-pay

## 2022-11-18 ENCOUNTER — Other Ambulatory Visit: Payer: Self-pay

## 2022-11-18 ENCOUNTER — Encounter: Payer: Self-pay | Admitting: Internal Medicine

## 2022-11-18 ENCOUNTER — Ambulatory Visit: Payer: Medicare HMO | Attending: Internal Medicine | Admitting: Internal Medicine

## 2022-11-18 VITALS — BP 118/75 | Temp 97.7°F | Ht 63.0 in | Wt 161.0 lb

## 2022-11-18 DIAGNOSIS — Z7985 Long-term (current) use of injectable non-insulin antidiabetic drugs: Secondary | ICD-10-CM

## 2022-11-18 DIAGNOSIS — Z7984 Long term (current) use of oral hypoglycemic drugs: Secondary | ICD-10-CM | POA: Diagnosis not present

## 2022-11-18 DIAGNOSIS — E083513 Diabetes mellitus due to underlying condition with proliferative diabetic retinopathy with macular edema, bilateral: Secondary | ICD-10-CM

## 2022-11-18 DIAGNOSIS — E1169 Type 2 diabetes mellitus with other specified complication: Secondary | ICD-10-CM

## 2022-11-18 DIAGNOSIS — I480 Paroxysmal atrial fibrillation: Secondary | ICD-10-CM

## 2022-11-18 DIAGNOSIS — E1165 Type 2 diabetes mellitus with hyperglycemia: Secondary | ICD-10-CM | POA: Diagnosis not present

## 2022-11-18 DIAGNOSIS — I739 Peripheral vascular disease, unspecified: Secondary | ICD-10-CM

## 2022-11-18 DIAGNOSIS — Z794 Long term (current) use of insulin: Secondary | ICD-10-CM

## 2022-11-18 DIAGNOSIS — N1832 Chronic kidney disease, stage 3b: Secondary | ICD-10-CM | POA: Insufficient documentation

## 2022-11-18 DIAGNOSIS — N1831 Chronic kidney disease, stage 3a: Secondary | ICD-10-CM

## 2022-11-18 DIAGNOSIS — E785 Hyperlipidemia, unspecified: Secondary | ICD-10-CM | POA: Diagnosis not present

## 2022-11-18 MED ORDER — DICLOFENAC SODIUM 1 % EX GEL
2.0000 g | Freq: Four times a day (QID) | CUTANEOUS | 0 refills | Status: DC
  Filled 2022-11-18: qty 100, 13d supply, fill #0

## 2022-11-18 MED ORDER — LANTUS SOLOSTAR 100 UNIT/ML ~~LOC~~ SOPN
55.0000 [IU] | PEN_INJECTOR | Freq: Every day | SUBCUTANEOUS | 3 refills | Status: DC
Start: 2022-11-18 — End: 2023-06-24
  Filled 2022-11-18 – 2023-05-06 (×3): qty 45, 81d supply, fill #0
  Filled 2023-06-03: qty 45, 81d supply, fill #1

## 2022-11-18 NOTE — Patient Instructions (Signed)
Increase Lantus insulin to 55 units daily.

## 2022-11-18 NOTE — Progress Notes (Signed)
Patient ID: Emily Farmer, female    DOB: [birth year]  MRN: [id number]  CC: Diabetes (DM f/u. Requesting BP machine. Francine Graven is not paying for Zetia. Ottis Stain on L toe Xtoday)   Subjective: Emily Farmer is a 67 y.o. 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  She had been out of the atorvastatin and Zetia.  Patient encouraged to take her medications and refills were sent. Taking meds. Reports today that her insurance would not pay for the Zetia and only has 2 pills left  DM:  Lab Results  Component Value Date   HGBA1C 10.2 (A) 08/22/2022  Followed by Dr. Brooks Sailors.  Last saw her 08/22/2022. Should be on Actos 15 mg daily, Jardiance 25 mg daily, Lantus 52 units daily and Humalog 12 units with each meal along with sliding scale, Victozia 1.8 mg daily.  Reports compliance with meds -Looking at her CGM, BS have been 200s to low 300s.  Lowest reading 143.  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  Referred by Dr. Lavona Mound to Dr. Fawn Kirk.  Started treatments with inj to both eyes  Seen by Dr. Allyson Sabal 10/14/2022 for evaluation for possible PAD.  She was referred by Dr. Loreta Ave.  He did ABIs.  This revealed tibial vessel disease with no high-grade lesions in the large vessels.  Some tenderness at the tip of the right big toe.  HTN/PAF/CHF:  no bruising or bleeding on Eliquis.  Reports compliance with taking metoprolol, Cardizem and digoxin Occasional SOB.  No LE edema We have been keeping an eye  on her kidney function.  Last GFR in October of last year was 15.  Prior to that it was in the 40s.  She is not on oral NSAIDs. 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   Bacteremia 04/01/2021   Atrial fibrillation with slow ventricular response (HCC) 03/31/2021   Pulmonary hypertension (HCC) 03/19/2021   Hypomagnesemia    Sepsis (HCC) 02/27/2021   Atrial fibrillation with rapid ventricular response (HCC) 02/27/2021   Hypotension 02/27/2021   Secondary hypercoagulable state (HCC) 05/08/2020   Dyslipidemia 09/23/2019   Toenail fungus 07/19/2018   Depression 07/19/2018   Iron deficiency anemia due to chronic blood loss 07/19/2018   Gastritis and duodenitis 07/19/2018   Chronic a-fib (HCC) 01/28/2018   Hyponatremia 01/28/2018   Chronic diastolic CHF (congestive heart failure) (HCC) 01/28/2018   Mixed hyperlipidemia 06/01/2017   Paroxysmal A-fib (HCC) 10/04/2016   Hypokalemia 01/04/2014   Acute pyelonephritis 02/15/2013   Essential hypertension 02/15/2013   Hypertriglyceridemia 02/15/2013   Coronary atherosclerosis seen on CT 02/15/2013     Current Outpatient Medications on File Prior to Visit  Medication Sig Dispense Refill   acetaminophen (TYLENOL) 325 MG tablet Take 2 tablets (650 mg total) by  mouth every 6 (six) hours as needed for mild pain (or Fever >/= 101).     apixaban (ELIQUIS) 5 MG TABS tablet Take 1 tablet (5 mg total) by mouth in the morning and at bedtime. 180 tablet 1   atorvastatin (LIPITOR) 40 MG tablet Take 1 tablet (40 mg total) by mouth daily. 90 tablet 3   Blood Pressure Monitoring (BLOOD PRESSURE MONITOR/ARM) DEVI Check blood  pressure once per day at least 2 hours after medications. 1 each 0   ciclopirox (PENLAC) 8 % solution Apply topically at bedtime. Apply over nail and surrounding skin. Apply daily over previous coat. After seven (7) days, may remove with alcohol and continue cycle. 6.6 mL 0   Continuous Blood Gluc Sensor (DEXCOM G7 SENSOR) MISC 1 Device by Does not apply route as directed. 9 each 3   diclofenac Sodium (VOLTAREN) 1 % GEL Apply 2 g topically 4 (four) times daily. Apply across lower back for back pain (Patient taking differently: Apply 2 g topically 4 (four) times daily as needed (back pain).) 100 g 0   digoxin (LANOXIN) 0.125 MG tablet Take 1 tablet (0.125 mg total) by mouth daily. 90 tablet 3   diltiazem (CARDIZEM CD) 120 MG 24 hr capsule Take 1 capsule (120 mg total) by mouth daily. 60 capsule 5   empagliflozin (JARDIANCE) 25 MG TABS tablet Take 1 tablet (25 mg total) by mouth daily before breakfast. 90 tablet 2   ezetimibe (ZETIA) 10 MG tablet Take 1 tablet (10 mg total) by mouth daily. 90 tablet 3   glucose blood (TRUE METRIX BLOOD GLUCOSE TEST) test strip Use as instructed 100 each 12   insulin glargine (LANTUS SOLOSTAR) 100 UNIT/ML Solostar Pen Inject 52 Units into the skin daily. 45 mL 3   insulin lispro (HUMALOG KWIKPEN) 100 UNIT/ML KwikPen Max daily 45 units 45 mL 2   Insulin Pen Needle 32G X 4 MM MISC use in the morning, at noon, in the evening, and at bedtime. 400 each 3   Insulin Syringe-Needle U-100 (TRUEPLUS INSULIN SYRINGE) 31G X 5/16" 0.3 ML MISC Use to inject insulin daily. (Patient taking differently: 1 each by Other route See admin instructions. Use to inject insulin daily.) 100 each 11   liraglutide (VICTOZA) 18 MG/3ML SOPN Inject 1.8 mg into the skin daily. 27 mL 3   metoprolol succinate (TOPROL-XL) 100 MG 24 hr tablet Take 1 tablet (100 mg total) by mouth daily. Take with or immediately following a meal. 90 tablet 6   Multiple Vitamins-Minerals (CENTRUM SILVER PO) Take 1 tablet by  mouth daily.     pioglitazone (ACTOS) 15 MG tablet Take 1 tablet (15 mg total) by mouth daily. 90 tablet 3   TRUEplus Lancets 28G MISC USE TO CHECK BLOOD SUGAR 3 TIMES DAILY 100 each 11   Blood Glucose Monitoring Suppl (TRUE METRIX METER) w/Device KIT Use to monitor blood sugar as directed (Patient not taking: Reported on 11/18/2022) 1 kit 0   ferrous sulfate 325 (65 FE) MG tablet Take 1 tablet (325 mg total) by mouth daily with breakfast. 30 tablet 4   No current facility-administered medications on file prior to visit.    Allergies  Allergen Reactions   Trulicity [Dulaglutide] Diarrhea    Social History   Socioeconomic History   Marital status: Single    Spouse name: Not on file   Number of children: Not on file   Years of education: Not on file   Highest education level: Not on file  Occupational History   Not on file  Tobacco Use   Smoking status: Never   Smokeless tobacco: Never  Vaping Use   Vaping Use: Never used  Substance and Sexual Activity   Alcohol use: No   Drug use: No   Sexual activity: Not Currently  Other Topics Concern   Not on file  Social History Narrative   Lives with her sister and does not need any assistance with ADLs.     Social Determinants of Health   Financial Resource Strain: Medium Risk (09/15/2022)   Overall Financial Resource Strain (CARDIA)    Difficulty of Paying Living Expenses: Somewhat hard  Food Insecurity: Food Insecurity Present (09/15/2022)   Hunger Vital Sign    Worried About Running Out of Food in the Last Year: Sometimes true    Ran Out of Food in the Last Year: Often true  Transportation Needs: No Transportation Needs (09/15/2022)   PRAPARE - Administrator, Civil Service (Medical): No    Lack of Transportation (Non-Medical): No  Physical Activity: Insufficiently Active (09/15/2022)   Exercise Vital Sign    Days of Exercise per Week: 4 days    Minutes of Exercise per Session: 10 min  Stress: Stress Concern Present  (07/21/2022)   [employer] of Occupational Health - Occupational Stress Questionnaire    Feeling of Stress : Very much  Social Connections: Socially Isolated (09/15/2022)   Social Connection and Isolation Panel [NHANES]    Frequency of Communication with Friends and Family: Once a week    Frequency of Social Gatherings with Friends and Family: More than three times a week    Attends Religious Services: Never    Database administrator or Organizations: No    Attends Banker Meetings: Never    Marital Status: Never married  Intimate Partner Violence: Not At Risk (09/15/2022)   Humiliation, Afraid, Rape, and Kick questionnaire    Fear of Current or Ex-Partner: No    Emotionally Abused: No    Physically Abused: No    Sexually Abused: No    Family History  Problem Relation Age of Onset   Heart attack Mother    Heart attack Father    Diabetes Sister    High blood pressure Neg Hx    High Cholesterol Neg Hx     Past Surgical History:  Procedure Laterality Date   BIOPSY  06/20/2018   Procedure: BIOPSY;  Surgeon: Vida Rigger, MD;  [facility]: [facility] ENDOSCOPY;  Service: Endoscopy;;   CHOLECYSTECTOMY N/A 01/04/2014   Procedure: LAPAROSCOPIC CHOLECYSTECTOMY WITH INTRAOPERATIVE CHOLANGIOGRAM;  Surgeon: Axel Filler, MD;  Location: [facility] OR;  Service: General;  Laterality: N/A;   COLONOSCOPY WITH PROPOFOL N/A 06/21/2018   Procedure: COLONOSCOPY WITH PROPOFOL;  Surgeon: Bernette Redbird, MD;  Location: [facility] ENDOSCOPY;  Service: Endoscopy;  Laterality: N/A;   ESOPHAGOGASTRODUODENOSCOPY (EGD) WITH PROPOFOL N/A 06/20/2018   Procedure: ESOPHAGOGASTRODUODENOSCOPY (EGD) WITH PROPOFOL;  Surgeon: Vida Rigger, MD;  Location: [facility] ENDOSCOPY;  Service: Endoscopy;  Laterality: N/A;   LUNG SURGERY     PACEMAKER IMPLANT N/A 03/03/2022   Procedure: PACEMAKER IMPLANT;  Surgeon: Marinus Maw, MD;  Location: [facility] INVASIVE CV LAB;  Service: Cardiovascular;  Laterality: N/A;    ROS: Review of Systems Negative  except as stated above  PHYSICAL EXAM: BP 118/75 (BP Location: Left Arm, Patient Position: Sitting, Cuff Size: Normal)   Temp 97.7 F (36.5 C) (Oral)   Ht 5\' 3"  (1.6 m)   Wt 161 lb (73  kg)   BMI 28.52 kg/m   Physical Exam  General appearance - alert, well appearing, and in no distress Mental status - normal mood, behavior, speech, dress, motor activity, and thought processes Chest - clear to auscultation, no wheezes, rales or rhonchi, symmetric air entry Heart -heart rate is irregularly irregular but rate controlled. Extremities -no lower extremity edema. Diabetic Foot Exam - Simple   Simple Foot Form Diabetic Foot exam was performed with the following findings: Yes 11/18/2022  1:19 PM  Visual Inspection See comments: Yes Sensation Testing See comments: Yes Pulse Check See comments: Yes Comments Dorsalis pedis and posterior tibialis pulses 2+ bilaterally.  Toenail on the right big toe is hyperpigmented and dystrophic.  Toenail on the right big toe has been cut a little short.  No signs of soft tissue infection.          Latest Ref Rng & Units 03/27/2022   11:15 AM 03/03/2022    9:10 AM 03/01/2022    9:00 AM  CMP  Glucose 70 - 99 mg/dL 161  096  045   BUN 8 - 27 mg/dL 48  32  62   Creatinine 0.57 - 1.00 mg/dL 4.09  8.11  9.14   Sodium 134 - 144 mmol/L 140  140  137   Potassium 3.5 - 5.2 mmol/L 4.5  4.5  4.8   Chloride 96 - 106 mmol/L 102  102  107   CO2 20 - 29 mmol/L 22  26  20    Calcium 8.7 - 10.3 mg/dL 9.5  78.2  9.0   Total Protein 6.5 - 8.1 g/dL   5.8   Total Bilirubin 0.3 - 1.2 mg/dL   0.5   Alkaline Phos 38 - 126 U/L   67   AST 15 - 41 U/L   44   ALT 0 - 44 U/L   29    Lipid Panel     Component Value Date/Time   CHOL 436 (H) 06/19/2022 1156   TRIG 1,149 (HH) 06/19/2022 1156   HDL 37 (L) 06/19/2022 1156   CHOLHDL 11.8 (H) 06/19/2022 1156   CHOLHDL 4.0 04/21/2021 0143   VLDL 53 (H) 04/21/2021 0143   LDLCALC Comment (A) 06/19/2022 1156   LDLDIRECT 137 (H)  03/01/2020 1012   LDLDIRECT 96.0 09/23/2019 0828    CBC    Component Value Date/Time   WBC 6.4 03/03/2022 0910   RBC 4.73 03/03/2022 0910   HGB 13.7 03/03/2022 0910   HGB 12.6 04/11/2021 1447   HCT 41.9 03/03/2022 0910   HCT 39.0 04/11/2021 1447   PLT 181 03/03/2022 0910   PLT 421 04/11/2021 1447   MCV 88.6 03/03/2022 0910   MCV 87 04/11/2021 1447   MCH 29.0 03/03/2022 0910   MCHC 32.7 03/03/2022 0910   RDW 13.2 03/03/2022 0910   RDW 13.8 04/11/2021 1447   LYMPHSABS 1.5 03/03/2022 0910   LYMPHSABS 1.9 04/11/2021 1447   MONOABS 0.7 03/03/2022 0910   EOSABS 0.3 03/03/2022 0910   EOSABS 0.4 04/11/2021 1447   BASOSABS 0.0 03/03/2022 0910   BASOSABS 0.0 04/11/2021 1447    ASSESSMENT AND PLAN: 1. Type 2 diabetes mellitus with hyperglycemia, with long-term current use of insulin (HCC) Looks like blood sugars have been running in the 2-300s.  She reports compliance with medications.  Recommend increasing Lantus insulin from 52 units to 55 units.  Stressed importance of healthy eating habits. - Microalbumin / creatinine urine ratio - insulin glargine (LANTUS SOLOSTAR) 100 UNIT/ML Solostar  Pen; Inject 55 Units into the skin daily.  Dispense: 45 mL; Refill: 3 - Comprehensive metabolic panel  2. Proliferative diabetic retinopathy of both eyes with macular edema associated with diabetes mellitus due to underlying condition (HCC) Followed by retina specialist Dr. Luciana Axe.  3. Hyperlipidemia associated with type 2 diabetes mellitus (HCC) Continue atorvastatin 40 mg.  We will recheck lipid profile today.  If triglyceride is still significantly elevated, we may add Tricor since insurance will no longer pay for Zetia - Lipid panel  4. CKD stage 3a, GFR 45-59 ml/min (HCC) Discussed the importance of good blood pressure and diabetes control. - Comprehensive metabolic panel  5. PAD (peripheral artery disease) (HCC) Continue statin therapy.  6. Paroxysmal A-fib (HCC) Continue  Eliquis.      Patient was given the opportunity to ask questions.  Patient verbalized understanding of the plan and was able to repeat key elements of the plan.   This documentation was completed using Paediatric nurse.  Any transcriptional errors are unintentional.  No orders of the defined types were placed in this encounter.    Requested Prescriptions   Pending Prescriptions Disp Refills   diclofenac Sodium (VOLTAREN) 1 % GEL 100 g 0    Sig: Apply 2 g topically 4 (four) times daily. Apply across lower back for back pain    No follow-ups on file.  Jonah Blue, MD, FACP

## 2022-11-19 ENCOUNTER — Telehealth: Payer: Self-pay | Admitting: Internal Medicine

## 2022-11-19 ENCOUNTER — Other Ambulatory Visit: Payer: Self-pay

## 2022-11-19 LAB — LIPID PANEL
Chol/HDL Ratio: 11.3 ratio — ABNORMAL HIGH (ref 0.0–4.4)
Cholesterol, Total: 351 mg/dL — ABNORMAL HIGH (ref 100–199)
HDL: 31 mg/dL — ABNORMAL LOW (ref >39)
Triglycerides: 1530 mg/dL (ref 0–149)

## 2022-11-19 LAB — COMPREHENSIVE METABOLIC PANEL
ALT: 16 IU/L (ref 0–32)
AST: 14 IU/L (ref 0–40)
Albumin/Globulin Ratio: 1.9 (ref 1.2–2.2)
Albumin: 4.6 g/dL (ref 3.9–4.9)
Alkaline Phosphatase: 99 IU/L (ref 44–121)
BUN/Creatinine Ratio: 49 — ABNORMAL HIGH (ref 12–28)
BUN: 53 mg/dL — ABNORMAL HIGH (ref 8–27)
Bilirubin Total: 0.3 mg/dL (ref 0.0–1.2)
CO2: 22 mmol/L (ref 20–29)
Calcium: 9.6 mg/dL (ref 8.7–10.3)
Chloride: 97 mmol/L (ref 96–106)
Creatinine, Ser: 1.09 mg/dL — ABNORMAL HIGH (ref 0.57–1.00)
Globulin, Total: 2.4 g/dL (ref 1.5–4.5)
Glucose: 133 mg/dL — ABNORMAL HIGH (ref 70–99)
Potassium: 4.1 mmol/L (ref 3.5–5.2)
Sodium: 136 mmol/L (ref 134–144)
Total Protein: 7 g/dL (ref 6.0–8.5)
eGFR: 56 mL/min/{1.73_m2} — ABNORMAL LOW (ref >59)

## 2022-11-19 LAB — MICROALBUMIN / CREATININE URINE RATIO
Creatinine, Urine: 70.3 mg/dL
Microalb/Creat Ratio: 102 mg/g creat — ABNORMAL HIGH (ref 0–29)
Microalbumin, Urine: 71.4 ug/mL

## 2022-11-19 MED ORDER — FENOFIBRATE 48 MG PO TABS
48.0000 mg | ORAL_TABLET | Freq: Every day | ORAL | 1 refills | Status: DC
  Filled 2022-11-19: qty 90, 90d supply, fill #0

## 2022-11-19 NOTE — Telephone Encounter (Signed)
Phone call placed to patient today to go over lab results.  I informed patient that her triglyceride level is still significantly elevated greater than 1500.  She tells me that she has been taking the atorvastatin and Zetia consistently.  This increases her risks for pancreatitis.   Advised to stop the Zetia. We will start her on medication called Tricor instead to help lower the triglyceride.  She will continue the atorvastatin. Liver function test normal. Kidney function not 100% but stable compared to when last checked. Patient expressed understanding and was able to repeat back instructions.  All questions were answered.

## 2022-11-20 ENCOUNTER — Other Ambulatory Visit: Payer: Self-pay

## 2022-11-21 ENCOUNTER — Other Ambulatory Visit (HOSPITAL_COMMUNITY): Payer: Self-pay

## 2022-11-24 ENCOUNTER — Other Ambulatory Visit: Payer: Self-pay

## 2022-11-25 ENCOUNTER — Other Ambulatory Visit: Payer: Self-pay

## 2022-12-01 DIAGNOSIS — E1165 Type 2 diabetes mellitus with hyperglycemia: Secondary | ICD-10-CM | POA: Diagnosis not present

## 2022-12-08 ENCOUNTER — Ambulatory Visit: Payer: Medicare HMO | Attending: Internal Medicine | Admitting: Internal Medicine

## 2022-12-08 ENCOUNTER — Encounter: Payer: Self-pay | Admitting: Internal Medicine

## 2022-12-08 VITALS — BP 134/62 | HR 92 | Ht 63.0 in | Wt 164.0 lb

## 2022-12-08 DIAGNOSIS — Z950 Presence of cardiac pacemaker: Secondary | ICD-10-CM

## 2022-12-08 DIAGNOSIS — Z01812 Encounter for preprocedural laboratory examination: Secondary | ICD-10-CM | POA: Diagnosis not present

## 2022-12-08 DIAGNOSIS — I4819 Other persistent atrial fibrillation: Secondary | ICD-10-CM | POA: Diagnosis not present

## 2022-12-08 NOTE — Patient Instructions (Signed)
Medication Instructions:  Your physician recommends that you continue on your current medications as directed. Please refer to the Current Medication list given to you today. *If you need a refill on your cardiac medications before your next appointment, please call your pharmacy*   Lab Work: CBC and BMT Today If you have labs (blood work) drawn today and your tests are completely normal, you will receive your results only by: MyChart Message (if you have MyChart) OR A paper copy in the mail If you have any lab test that is abnormal or we need to change your treatment, we will call you to review the results.   Testing/Procedures: AV Node Ablation  Your physician has recommended that you have an ablation. Catheter ablation is a medical procedure used to treat some cardiac arrhythmias (irregular heartbeats). During catheter ablation, a long, thin, flexible tube is put into a blood vessel in your groin (upper thigh), or neck. This tube is called an ablation catheter. It is then guided to your heart through the blood vessel. Radio frequency waves destroy small areas of heart tissue where abnormal heartbeats may cause an arrhythmia to start. Please see the instruction sheet given to you today.   Follow-Up: At [facility], you and your health needs are our priority.  As part of our continuing mission to provide you with exceptional heart care, we have created designated Provider Care Teams.  These Care Teams include your primary Cardiologist (physician) and Advanced Practice Providers (APPs -  Physician Assistants and Nurse Practitioners) who all work together to provide you with the care you need, when you need it.  We recommend signing up for the patient portal called "MyChart".  Sign up information is provided on this After Visit Summary.  MyChart is used to connect with patients for Virtual Visits (Telemedicine).  Patients are able to view lab/test results, encounter notes, upcoming  appointments, etc.  Non-urgent messages can be sent to your provider as well.   To learn more about what you can do with MyChart, go to [email or web address].    Your next appointment:   We will schedule your follow up after the ablation  Provider:   Lewayne Bunting, MD

## 2022-12-08 NOTE — Progress Notes (Signed)
HPI Emily Farmer returns today for ongoing evaluation of atrial fib with tachy-brady syndrome. She is a pleasant 67 yo woman with uncontrolled atrial fib and is s/p VVI PM insertion about 9 months ago. She has been on both beta blocker and digoxin but continues to have RVR. She has not yet taken cardizem. She denies chest pain but she quickly gets out of breath with any walking.  Allergies  Allergen Reactions   Trulicity [Dulaglutide] Diarrhea     Current Outpatient Medications  Medication Sig Dispense Refill   acetaminophen (TYLENOL) 325 MG tablet Take 2 tablets (650 mg total) by mouth every 6 (six) hours as needed for mild pain (or Fever >/= 101).     apixaban (ELIQUIS) 5 MG TABS tablet Take 1 tablet (5 mg total) by mouth in the morning and at bedtime. 180 tablet 1   atorvastatin (LIPITOR) 40 MG tablet Take 1 tablet (40 mg total) by mouth daily. 90 tablet 3   Blood Glucose Monitoring Suppl (TRUE METRIX METER) w/Device KIT Use to monitor blood sugar as directed 1 kit 0   Blood Pressure Monitoring (BLOOD PRESSURE MONITOR/ARM) DEVI Check blood pressure once per day at least 2 hours after medications. 1 each 0   ciclopirox (PENLAC) 8 % solution Apply topically at bedtime. Apply over nail and surrounding skin. Apply daily over previous coat. After seven (7) days, may remove with alcohol and continue cycle. 6.6 mL 0   Continuous Blood Gluc Sensor (DEXCOM G7 SENSOR) MISC 1 Device by Does not apply route as directed. 9 each 3   diclofenac Sodium (VOLTAREN) 1 % GEL Apply 2 g topically 4 (four) times daily. Apply across lower back for back pain 100 g 0   digoxin (LANOXIN) 0.125 MG tablet Take 1 tablet (0.125 mg total) by mouth daily. 90 tablet 3   diltiazem (CARDIZEM CD) 120 MG 24 hr capsule Take 1 capsule (120 mg total) by mouth daily. 60 capsule 5   empagliflozin (JARDIANCE) 25 MG TABS tablet Take 1 tablet (25 mg total) by mouth daily before breakfast. 90 tablet 2   fenofibrate (TRICOR) 48 MG  tablet Take 1 tablet (48 mg total) by mouth daily. 90 tablet 1   glucose blood (TRUE METRIX BLOOD GLUCOSE TEST) test strip Use as instructed 100 each 12   insulin glargine (LANTUS SOLOSTAR) 100 UNIT/ML Solostar Pen Inject 55 Units into the skin daily. 45 mL 3   insulin lispro (HUMALOG KWIKPEN) 100 UNIT/ML KwikPen Max daily 45 units 45 mL 2   Insulin Pen Needle 32G X 4 MM MISC use in the morning, at noon, in the evening, and at bedtime. 400 each 3   Insulin Syringe-Needle U-100 (TRUEPLUS INSULIN SYRINGE) 31G X 5/16" 0.3 ML MISC Use to inject insulin daily. (Patient taking differently: 1 each by Other route See admin instructions. Use to inject insulin daily.) 100 each 11   liraglutide (VICTOZA) 18 MG/3ML SOPN Inject 1.8 mg into the skin daily. 27 mL 3   metoprolol succinate (TOPROL-XL) 100 MG 24 hr tablet Take 1 tablet (100 mg total) by mouth daily. Take with or immediately following a meal. 90 tablet 6   Multiple Vitamins-Minerals (CENTRUM SILVER PO) Take 1 tablet by mouth daily.     pioglitazone (ACTOS) 15 MG tablet Take 1 tablet (15 mg total) by mouth daily. 90 tablet 3   TRUEplus Lancets 28G MISC USE TO CHECK BLOOD SUGAR 3 TIMES DAILY 100 each 11   ferrous sulfate 325 (65  FE) MG tablet Take 1 tablet (325 mg total) by mouth daily with breakfast. 30 tablet 4   No current facility-administered medications for this visit.     Past Medical History:  Diagnosis Date   Arthritis    Atrial fibrillation (HCC)    a. 09/2016 in setting of pancreatitis;  b. 09/2016 Echo: EF 65-60%, no rwma, Gr2 DD, mild MR, triv TR, PASP ;  CHA2DS2VASc = 4-->Eliquis 5mg  BID. c. Recurred 06/2018 in setting of GIB.   Chronic diastolic CHF (congestive heart failure) (HCC) 01/28/2018   Diabetes mellitus    GI bleeding 06/2018   Gout    Hyperlipidemia    Hypertension    Hypertriglyceridemia    Pancreatitis    a. 09/2016 - Triglycerides 1,392 on admission.    ROS:   All systems reviewed and negative except as  noted in the HPI.   Past Surgical History:  Procedure Laterality Date   BIOPSY  06/20/2018   Procedure: BIOPSY;  Surgeon: Vida Rigger, MD;  Location: [facility] ENDOSCOPY;  Service: Endoscopy;;   CHOLECYSTECTOMY N/A 01/04/2014   Procedure: LAPAROSCOPIC CHOLECYSTECTOMY WITH INTRAOPERATIVE CHOLANGIOGRAM;  Surgeon: Axel Filler, MD;  Location: [facility] OR;  Service: General;  Laterality: N/A;   COLONOSCOPY WITH PROPOFOL N/A 06/21/2018   Procedure: COLONOSCOPY WITH PROPOFOL;  Surgeon: Bernette Redbird, MD;  Location: [facility] ENDOSCOPY;  Service: Endoscopy;  Laterality: N/A;   ESOPHAGOGASTRODUODENOSCOPY (EGD) WITH PROPOFOL N/A 06/20/2018   Procedure: ESOPHAGOGASTRODUODENOSCOPY (EGD) WITH PROPOFOL;  Surgeon: Vida Rigger, MD;  Location: [facility] ENDOSCOPY;  Service: Endoscopy;  Laterality: N/A;   LUNG SURGERY     PACEMAKER IMPLANT N/A 03/03/2022   Procedure: PACEMAKER IMPLANT;  Surgeon: Marinus Maw, MD;  Location: [facility] INVASIVE CV LAB;  Service: Cardiovascular;  Laterality: N/A;     Family History  Problem Relation Age of Onset   Heart attack Mother    Heart attack Father    Diabetes Sister    High blood pressure Neg Hx    High Cholesterol Neg Hx      Social History   Socioeconomic History   Marital status: Single    Spouse name: Not on file   Number of children: Not on file   Years of education: Not on file   Highest education level: Not on file  Occupational History   Not on file  Tobacco Use   Smoking status: Never   Smokeless tobacco: Never  Vaping Use   Vaping Use: Never used  Substance and Sexual Activity   Alcohol use: No   Drug use: No   Sexual activity: Not Currently  Other Topics Concern   Not on file  Social History Narrative   Lives with her sister and does not need any assistance with ADLs.     Social Determinants of Health   Financial Resource Strain: Medium Risk (09/15/2022)   Overall Financial Resource Strain (CARDIA)    Difficulty of Paying Living Expenses: Somewhat hard  Food  Insecurity: Food Insecurity Present (09/15/2022)   Hunger Vital Sign    Worried About Running Out of Food in the Last Year: Sometimes true    Ran Out of Food in the Last Year: Often true  Transportation Needs: No Transportation Needs (09/15/2022)   PRAPARE - Administrator, Civil Service (Medical): No    Lack of Transportation (Non-Medical): No  Physical Activity: Insufficiently Active (09/15/2022)   Exercise Vital Sign    Days of Exercise per Week: 4 days    Minutes of Exercise per  Session: 10 min  Stress: Stress Concern Present (07/21/2022)   [employer] of Occupational Health - Occupational Stress Questionnaire    Feeling of Stress : Very much  Social Connections: Socially Isolated (09/15/2022)   Social Connection and Isolation Panel [NHANES]    Frequency of Communication with Friends and Family: Once a week    Frequency of Social Gatherings with Friends and Family: More than three times a week    Attends Religious Services: Never    Database administrator or Organizations: No    Attends Banker Meetings: Never    Marital Status: Never married  Intimate Partner Violence: Not At Risk (09/15/2022)   Humiliation, Afraid, Rape, and Kick questionnaire    Fear of Current or Ex-Partner: No    Emotionally Abused: No    Physically Abused: No    Sexually Abused: No     BP 134/62   Pulse 92   Ht 5\' 3"  (1.6 m)   Wt 164 lb (74.4 kg)   SpO2 96%   BMI 29.05 kg/m   Physical Exam:  Well appearing NAD HEENT: Unremarkable Neck:  No JVD, no thyromegally Lymphatics:  No adenopathy Back:  No CVA tenderness Lungs:  Clear HEART:  Regular rate rhythm, no murmurs, no rubs, no clicks Abd:  soft, positive bowel sounds, no organomegally, no rebound, no guarding Ext:  2 plus pulses, no edema, no cyanosis, no clubbing Skin:  No rashes no nodules Neuro:  CN II through XII intact, motor grossly intact  EKG - reviewed. uncontrolled atrial fib  DEVICE  Normal device  function.  See PaceArt for details.   Assess/Plan:  Uncontrolled atrial fib. I have discussed the treatment options with the patient and we will try to slow her down with the addition of cardizem.Her HR remains uncontrolled and I have recommended proceeding with AV node ablation.  2.  Chronic diastolic heart failure - her symptoms are class 3 with her RVR. I have discussed AV node ablation and she wishes to proceed. 3. PPM -her single chamber medtronic PPM is working normally. 4. Coags - she has not had any bleeding. We will follow.   Sharlot Gowda Clarine Elrod,MD

## 2022-12-08 NOTE — H&P (View-Only) (Signed)
HPI Emily Farmer returns today for ongoing evaluation of atrial fib with tachy-brady syndrome. She is a pleasant 67 yo woman with uncontrolled atrial fib and is s/p VVI PM insertion about 9 months ago. She has been on both beta blocker and digoxin but continues to have RVR. She has not yet taken cardizem. She denies chest pain but she quickly gets out of breath with any walking.  Allergies  Allergen Reactions   Trulicity [Dulaglutide] Diarrhea     Current Outpatient Medications  Medication Sig Dispense Refill   acetaminophen (TYLENOL) 325 MG tablet Take 2 tablets (650 mg total) by mouth every 6 (six) hours as needed for mild pain (or Fever >/= 101).     apixaban (ELIQUIS) 5 MG TABS tablet Take 1 tablet (5 mg total) by mouth in the morning and at bedtime. 180 tablet 1   atorvastatin (LIPITOR) 40 MG tablet Take 1 tablet (40 mg total) by mouth daily. 90 tablet 3   Blood Glucose Monitoring Suppl (TRUE METRIX METER) w/Device KIT Use to monitor blood sugar as directed 1 kit 0   Blood Pressure Monitoring (BLOOD PRESSURE MONITOR/ARM) DEVI Check blood pressure once per day at least 2 hours after medications. 1 each 0   ciclopirox (PENLAC) 8 % solution Apply topically at bedtime. Apply over nail and surrounding skin. Apply daily over previous coat. After seven (7) days, may remove with alcohol and continue cycle. 6.6 mL 0   Continuous Blood Gluc Sensor (DEXCOM G7 SENSOR) MISC 1 Device by Does not apply route as directed. 9 each 3   diclofenac Sodium (VOLTAREN) 1 % GEL Apply 2 g topically 4 (four) times daily. Apply across lower back for back pain 100 g 0   digoxin (LANOXIN) 0.125 MG tablet Take 1 tablet (0.125 mg total) by mouth daily. 90 tablet 3   diltiazem (CARDIZEM CD) 120 MG 24 hr capsule Take 1 capsule (120 mg total) by mouth daily. 60 capsule 5   empagliflozin (JARDIANCE) 25 MG TABS tablet Take 1 tablet (25 mg total) by mouth daily before breakfast. 90 tablet 2   fenofibrate (TRICOR) 48 MG  tablet Take 1 tablet (48 mg total) by mouth daily. 90 tablet 1   glucose blood (TRUE METRIX BLOOD GLUCOSE TEST) test strip Use as instructed 100 each 12   insulin glargine (LANTUS SOLOSTAR) 100 UNIT/ML Solostar Pen Inject 55 Units into the skin daily. 45 mL 3   insulin lispro (HUMALOG KWIKPEN) 100 UNIT/ML KwikPen Max daily 45 units 45 mL 2   Insulin Pen Needle 32G X 4 MM MISC use in the morning, at noon, in the evening, and at bedtime. 400 each 3   Insulin Syringe-Needle U-100 (TRUEPLUS INSULIN SYRINGE) 31G X 5/16" 0.3 ML MISC Use to inject insulin daily. (Patient taking differently: 1 each by Other route See admin instructions. Use to inject insulin daily.) 100 each 11   liraglutide (VICTOZA) 18 MG/3ML SOPN Inject 1.8 mg into the skin daily. 27 mL 3   metoprolol succinate (TOPROL-XL) 100 MG 24 hr tablet Take 1 tablet (100 mg total) by mouth daily. Take with or immediately following a meal. 90 tablet 6   Multiple Vitamins-Minerals (CENTRUM SILVER PO) Take 1 tablet by mouth daily.     pioglitazone (ACTOS) 15 MG tablet Take 1 tablet (15 mg total) by mouth daily. 90 tablet 3   TRUEplus Lancets 28G MISC USE TO CHECK BLOOD SUGAR 3 TIMES DAILY 100 each 11   ferrous sulfate 325 (65  FE) MG tablet Take 1 tablet (325 mg total) by mouth daily with breakfast. 30 tablet 4   No current facility-administered medications for this visit.     Past Medical History:  Diagnosis Date   Arthritis    Atrial fibrillation (HCC)    a. 09/2016 in setting of pancreatitis;  b. 09/2016 Echo: EF 65-60%, no rwma, Gr2 DD, mild MR, triv TR, PASP ;  CHA2DS2VASc = 4-->Eliquis 5mg  BID. c. Recurred 06/2018 in setting of GIB.   Chronic diastolic CHF (congestive heart failure) (HCC) 01/28/2018   Diabetes mellitus    GI bleeding 06/2018   Gout    Hyperlipidemia    Hypertension    Hypertriglyceridemia    Pancreatitis    a. 09/2016 - Triglycerides 1,392 on admission.    ROS:   All systems reviewed and negative except as  noted in the HPI.   Past Surgical History:  Procedure Laterality Date   BIOPSY  06/20/2018   Procedure: BIOPSY;  Surgeon: Vida Rigger, MD;  Location: [facility] ENDOSCOPY;  Service: Endoscopy;;   CHOLECYSTECTOMY N/A 01/04/2014   Procedure: LAPAROSCOPIC CHOLECYSTECTOMY WITH INTRAOPERATIVE CHOLANGIOGRAM;  Surgeon: Axel Filler, MD;  Location: [facility] OR;  Service: General;  Laterality: N/A;   COLONOSCOPY WITH PROPOFOL N/A 06/21/2018   Procedure: COLONOSCOPY WITH PROPOFOL;  Surgeon: Bernette Redbird, MD;  Location: [facility] ENDOSCOPY;  Service: Endoscopy;  Laterality: N/A;   ESOPHAGOGASTRODUODENOSCOPY (EGD) WITH PROPOFOL N/A 06/20/2018   Procedure: ESOPHAGOGASTRODUODENOSCOPY (EGD) WITH PROPOFOL;  Surgeon: Vida Rigger, MD;  Location: [facility] ENDOSCOPY;  Service: Endoscopy;  Laterality: N/A;   LUNG SURGERY     PACEMAKER IMPLANT N/A 03/03/2022   Procedure: PACEMAKER IMPLANT;  Surgeon: Marinus Maw, MD;  Location: [facility] INVASIVE CV LAB;  Service: Cardiovascular;  Laterality: N/A;     Family History  Problem Relation Age of Onset   Heart attack Mother    Heart attack Father    Diabetes Sister    High blood pressure Neg Hx    High Cholesterol Neg Hx      Social History   Socioeconomic History   Marital status: Single    Spouse name: Not on file   Number of children: Not on file   Years of education: Not on file   Highest education level: Not on file  Occupational History   Not on file  Tobacco Use   Smoking status: Never   Smokeless tobacco: Never  Vaping Use   Vaping Use: Never used  Substance and Sexual Activity   Alcohol use: No   Drug use: No   Sexual activity: Not Currently  Other Topics Concern   Not on file  Social History Narrative   Lives with her sister and does not need any assistance with ADLs.     Social Determinants of Health   Financial Resource Strain: Medium Risk (09/15/2022)   Overall Financial Resource Strain (CARDIA)    Difficulty of Paying Living Expenses: Somewhat hard  Food  Insecurity: Food Insecurity Present (09/15/2022)   Hunger Vital Sign    Worried About Running Out of Food in the Last Year: Sometimes true    Ran Out of Food in the Last Year: Often true  Transportation Needs: No Transportation Needs (09/15/2022)   PRAPARE - Administrator, Civil Service (Medical): No    Lack of Transportation (Non-Medical): No  Physical Activity: Insufficiently Active (09/15/2022)   Exercise Vital Sign    Days of Exercise per Week: 4 days    Minutes of Exercise per  Session: 10 min  Stress: Stress Concern Present (07/21/2022)   [employer] of Occupational Health - Occupational Stress Questionnaire    Feeling of Stress : Very much  Social Connections: Socially Isolated (09/15/2022)   Social Connection and Isolation Panel [NHANES]    Frequency of Communication with Friends and Family: Once a week    Frequency of Social Gatherings with Friends and Family: More than three times a week    Attends Religious Services: Never    Database administrator or Organizations: No    Attends Banker Meetings: Never    Marital Status: Never married  Intimate Partner Violence: Not At Risk (09/15/2022)   Humiliation, Afraid, Rape, and Kick questionnaire    Fear of Current or Ex-Partner: No    Emotionally Abused: No    Physically Abused: No    Sexually Abused: No     BP 134/62   Pulse 92   Ht 5\' 3"  (1.6 m)   Wt 164 lb (74.4 kg)   SpO2 96%   BMI 29.05 kg/m   Physical Exam:  Well appearing NAD HEENT: Unremarkable Neck:  No JVD, no thyromegally Lymphatics:  No adenopathy Back:  No CVA tenderness Lungs:  Clear HEART:  Regular rate rhythm, no murmurs, no rubs, no clicks Abd:  soft, positive bowel sounds, no organomegally, no rebound, no guarding Ext:  2 plus pulses, no edema, no cyanosis, no clubbing Skin:  No rashes no nodules Neuro:  CN II through XII intact, motor grossly intact  EKG - reviewed. uncontrolled atrial fib  DEVICE  Normal device  function.  See PaceArt for details.   Assess/Plan:  Uncontrolled atrial fib. I have discussed the treatment options with the patient and we will try to slow her down with the addition of cardizem.Her HR remains uncontrolled and I have recommended proceeding with AV node ablation.  2.  Chronic diastolic heart failure - her symptoms are class 3 with her RVR. I have discussed AV node ablation and she wishes to proceed. 3. PPM -her single chamber medtronic PPM is working normally. 4. Coags - she has not had any bleeding. We will follow.   Sharlot Gowda Taylor,MD

## 2022-12-09 DIAGNOSIS — H02833 Dermatochalasis of right eye, unspecified eyelid: Secondary | ICD-10-CM | POA: Diagnosis not present

## 2022-12-09 DIAGNOSIS — H35033 Hypertensive retinopathy, bilateral: Secondary | ICD-10-CM | POA: Diagnosis not present

## 2022-12-09 DIAGNOSIS — E113513 Type 2 diabetes mellitus with proliferative diabetic retinopathy with macular edema, bilateral: Secondary | ICD-10-CM | POA: Diagnosis not present

## 2022-12-09 DIAGNOSIS — H02836 Dermatochalasis of left eye, unspecified eyelid: Secondary | ICD-10-CM | POA: Diagnosis not present

## 2022-12-09 LAB — CBC
Hematocrit: 39.4 % (ref 34.0–46.6)
Hemoglobin: 12.8 g/dL (ref 11.1–15.9)
MCH: 29.5 pg (ref 26.6–33.0)
MCHC: 32.5 g/dL (ref 31.5–35.7)
MCV: 91 fL (ref 79–97)
Platelets: 249 10*3/uL (ref 150–450)
RBC: 4.34 x10E6/uL (ref 3.77–5.28)
RDW: 13.2 % (ref 11.7–15.4)
WBC: 9.7 10*3/uL (ref 3.4–10.8)

## 2022-12-09 LAB — BASIC METABOLIC PANEL
BUN/Creatinine Ratio: 27 (ref 12–28)
BUN: 31 mg/dL — ABNORMAL HIGH (ref 8–27)
CO2: 22 mmol/L (ref 20–29)
Calcium: 9.9 mg/dL (ref 8.7–10.3)
Chloride: 103 mmol/L (ref 96–106)
Creatinine, Ser: 1.13 mg/dL — ABNORMAL HIGH (ref 0.57–1.00)
Glucose: 55 mg/dL — ABNORMAL LOW (ref 70–99)
Potassium: 4.2 mmol/L (ref 3.5–5.2)
Sodium: 142 mmol/L (ref 134–144)
eGFR: 54 mL/min/{1.73_m2} — ABNORMAL LOW (ref >59)

## 2022-12-09 LAB — HM DIABETES EYE EXAM

## 2022-12-10 ENCOUNTER — Other Ambulatory Visit: Payer: Self-pay

## 2022-12-11 ENCOUNTER — Other Ambulatory Visit: Payer: Self-pay

## 2022-12-12 ENCOUNTER — Ambulatory Visit: Payer: Medicare HMO | Attending: Internal Medicine | Admitting: Pharmacist

## 2022-12-12 ENCOUNTER — Other Ambulatory Visit: Payer: Self-pay

## 2022-12-12 ENCOUNTER — Encounter: Payer: Self-pay | Admitting: Pharmacist

## 2022-12-12 DIAGNOSIS — Z794 Long term (current) use of insulin: Secondary | ICD-10-CM

## 2022-12-12 DIAGNOSIS — E1165 Type 2 diabetes mellitus with hyperglycemia: Secondary | ICD-10-CM | POA: Diagnosis not present

## 2022-12-12 NOTE — Progress Notes (Signed)
    S:    PCP: Dr. Laural Benes  67 y.o. female who presents for diabetes evaluation, education, and management.  PMH is significant for HTN, PAF, T2DM, and HLD. Patient was last seen by Primary Care Provider, Dr. Laural Benes, on 11/18/2022. Last seen by pharmacy clinic on 11/07/2022.   Today, patient arrives in good spirits and presents without any assistance. She is now using her Dexcom G7 consistently and brings this for review today.   Family Hx: diabetes (sister); heart attack (mom and dad) Social Hx: Denies use of tobacco and alcohol.  Current diabetes medications include: Lantus 55 units QPM, Humalog max daily dose of 45u (usually using once daily), Jardiance 25 mg daily, Victoza 1.8 mg daily, pioglitazone 15 mg daily    Insurance coverage: [employer]  Patient denies hypoglycemic events since last endocrinology visit.  Patient reports nocturia (nighttime urination).  Patient reports neuropathy (nerve pain). Patient reports visual changes. Patient reports self foot exams.   Patient reported dietary habits:  -Breakfast: scrambled eggs, 2 slices of bread, cheese  -Snacks: popcorn, low sugar ice cream -Dinner: ham and scallop potatoes -Beverages: water, drinks lemonade   Patient-reported exercise habits:  -Endorses walking for 1 hour daily.  O:  CGM data:  30 day report:  Avg: 162  In range: 65% 27% above (8% >250) No hypoglycemia.  Lab Results  Component Value Date   HGBA1C 10.2 (A) 08/22/2022   There were no vitals filed for this visit.  Lipid Panel     Component Value Date/Time   CHOL 351 (H) 11/18/2022 1216   TRIG 1,530 (HH) 11/18/2022 1216   HDL 31 (L) 11/18/2022 1216   CHOLHDL 11.3 (H) 11/18/2022 1216   CHOLHDL 4.0 04/21/2021 0143   VLDL 53 (H) 04/21/2021 0143   LDLCALC Comment (A) 11/18/2022 1216   LDLDIRECT 137 (H) 03/01/2020 1012   LDLDIRECT 96.0 09/23/2019 0828    Clinical Atherosclerotic Cardiovascular Disease (ASCVD): No  The ASCVD Risk score (Arnett DK,  et al., 2019) failed to calculate for the following reasons:   The valid total cholesterol range is 130 to 320 mg/dL   A/P: Diabetes longstanding currently uncontrolled based on A1c, however, CGM data seems to show improvement. Her avgs are coming down. Patient is able to verbalize appropriate hypoglycemia management plan. Medication adherence reported. -Continued current regimen.  -Continued pioglitazone as prescribed by endocrinology. Of note, patient does have history of HF (EF 55-60% in Sept 2023). Patient denies symptoms of fluid overload/edema. Instructed patient to immediately report any changes. Will defer management to endocrinology.  -Extensively discussed pathophysiology of diabetes, recommended lifestyle interventions, dietary effects on blood sugar control.  -Counseled on s/sx of and management of hypoglycemia.  -Next A1c anticipated at next visit (01/13/2023).   Written patient instructions provided. Patient verbalized understanding of treatment plan.  Total time in face to face counseling 30 minutes.    Follow-up:  Me next month.   Butch Penny, PharmD, Patsy [place], CPP Clinical Pharmacist [facility] & [facility] [phone]

## 2022-12-15 ENCOUNTER — Other Ambulatory Visit: Payer: Self-pay

## 2022-12-15 DIAGNOSIS — E113511 Type 2 diabetes mellitus with proliferative diabetic retinopathy with macular edema, right eye: Secondary | ICD-10-CM | POA: Diagnosis not present

## 2022-12-15 DIAGNOSIS — E113513 Type 2 diabetes mellitus with proliferative diabetic retinopathy with macular edema, bilateral: Secondary | ICD-10-CM | POA: Diagnosis not present

## 2022-12-16 ENCOUNTER — Other Ambulatory Visit: Payer: Self-pay

## 2022-12-17 ENCOUNTER — Other Ambulatory Visit: Payer: Self-pay

## 2022-12-17 DIAGNOSIS — E113512 Type 2 diabetes mellitus with proliferative diabetic retinopathy with macular edema, left eye: Secondary | ICD-10-CM | POA: Diagnosis not present

## 2022-12-19 ENCOUNTER — Other Ambulatory Visit: Payer: Self-pay

## 2022-12-25 ENCOUNTER — Other Ambulatory Visit: Payer: Self-pay

## 2022-12-30 ENCOUNTER — Other Ambulatory Visit: Payer: Self-pay

## 2022-12-31 ENCOUNTER — Other Ambulatory Visit: Payer: Self-pay

## 2023-01-01 NOTE — Pre-Procedure Instructions (Signed)
Instructed patient on the following items: Arrival time 0900 Nothing to eat or drink after midnight No meds AM of procedure Responsible person to drive you home and stay with you for 24 hrs  Have you missed any doses of anti-coagulant Eliquis- last dose should have been 7/16

## 2023-01-02 ENCOUNTER — Other Ambulatory Visit: Payer: Self-pay

## 2023-01-02 ENCOUNTER — Ambulatory Visit (HOSPITAL_COMMUNITY)
Admission: RE | Admit: 2023-01-02 | Discharge: 2023-01-02 | Disposition: A | Discharge status: Home / Self Care | Payer: Medicare HMO | Attending: Internal Medicine | Admitting: Internal Medicine

## 2023-01-02 ENCOUNTER — Encounter (HOSPITAL_COMMUNITY)
Admission: RE | Disposition: A | Discharge status: Home / Self Care | Payer: Self-pay | Source: Home / Self Care | Attending: Internal Medicine

## 2023-01-02 DIAGNOSIS — Z950 Presence of cardiac pacemaker: Secondary | ICD-10-CM | POA: Diagnosis not present

## 2023-01-02 DIAGNOSIS — D689 Coagulation defect, unspecified: Secondary | ICD-10-CM | POA: Diagnosis not present

## 2023-01-02 DIAGNOSIS — I110 Hypertensive heart disease with heart failure: Secondary | ICD-10-CM | POA: Insufficient documentation

## 2023-01-02 DIAGNOSIS — I495 Sick sinus syndrome: Secondary | ICD-10-CM | POA: Insufficient documentation

## 2023-01-02 DIAGNOSIS — Z79899 Other long term (current) drug therapy: Secondary | ICD-10-CM | POA: Insufficient documentation

## 2023-01-02 DIAGNOSIS — I4891 Unspecified atrial fibrillation: Secondary | ICD-10-CM | POA: Diagnosis not present

## 2023-01-02 DIAGNOSIS — I5032 Chronic diastolic (congestive) heart failure: Secondary | ICD-10-CM | POA: Insufficient documentation

## 2023-01-02 HISTORY — PX: AV NODE ABLATION: EP1193

## 2023-01-02 LAB — GLUCOSE, CAPILLARY
Glucose-Capillary: 178 mg/dL — ABNORMAL HIGH (ref 70–99)
Glucose-Capillary: 129 mg/dL — ABNORMAL HIGH (ref 70–99)

## 2023-01-02 SURGERY — AV NODE ABLATION

## 2023-01-02 MED ORDER — BUPIVACAINE HCL (PF) 0.25 % IJ SOLN
INTRAMUSCULAR | Status: DC | PRN
  Administered 2023-01-02: 30 mL

## 2023-01-02 MED ORDER — MIDAZOLAM HCL 5 MG/5ML IJ SOLN
INTRAMUSCULAR | Status: AC
  Filled 2023-01-02: qty 5

## 2023-01-02 MED ORDER — MIDAZOLAM HCL 5 MG/5ML IJ SOLN
INTRAMUSCULAR | Status: DC | PRN
  Administered 2023-01-02: 2 mg via INTRAVENOUS

## 2023-01-02 MED ORDER — SODIUM CHLORIDE 0.9% FLUSH: 3.0000 mL | Freq: Two times a day (BID) | INTRAVENOUS | Status: DC

## 2023-01-02 MED ORDER — ACETAMINOPHEN 325 MG PO TABS: 650.0000 mg | ORAL_TABLET | ORAL | Status: DC | PRN

## 2023-01-02 MED ORDER — BUPIVACAINE HCL (PF) 0.25 % IJ SOLN
INTRAMUSCULAR | Status: AC
  Filled 2023-01-02: qty 30

## 2023-01-02 MED ORDER — FENTANYL CITRATE (PF) 100 MCG/2ML IJ SOLN
INTRAMUSCULAR | Status: DC | PRN
  Administered 2023-01-02: 25 ug via INTRAVENOUS

## 2023-01-02 MED ORDER — SODIUM CHLORIDE 0.9% FLUSH: 3.0000 mL | INTRAVENOUS | Status: DC | PRN

## 2023-01-02 MED ORDER — ONDANSETRON HCL 4 MG/2ML IJ SOLN: 4.0000 mg | Freq: Four times a day (QID) | INTRAMUSCULAR | Status: DC | PRN

## 2023-01-02 MED ORDER — HEPARIN (PORCINE) IN NACL 1000-0.9 UT/500ML-% IV SOLN
INTRAVENOUS | Status: DC | PRN
  Administered 2023-01-02: 500 mL

## 2023-01-02 MED ORDER — SODIUM CHLORIDE 0.9 % IV SOLN: INTRAVENOUS | Status: DC

## 2023-01-02 MED ORDER — FENTANYL CITRATE (PF) 100 MCG/2ML IJ SOLN
INTRAMUSCULAR | Status: AC
  Filled 2023-01-02: qty 2

## 2023-01-02 MED ORDER — SODIUM CHLORIDE 0.9 % IV SOLN: 250.0000 mL | INTRAVENOUS | Status: DC | PRN

## 2023-01-02 SURGICAL SUPPLY — 6 items
BAG SNAP BAND KOVER 36X36 (MISCELLANEOUS) IMPLANT
CATH BLAZER 7FR 4MM LG 5031TK2 (ABLATOR) IMPLANT
PACK EP LATEX FREE (CUSTOM PROCEDURE TRAY) ×1
PACK EP LF (CUSTOM PROCEDURE TRAY) ×1 IMPLANT
PAD DEFIB RADIO PHYSIO CONN (PAD) ×1 IMPLANT
SHEATH PINNACLE 8F 10CM (SHEATH) IMPLANT

## 2023-01-02 NOTE — Discharge Instructions (Signed)

## 2023-01-02 NOTE — Interval H&P Note (Signed)
History and Physical Interval Note:  01/02/2023 10:52 AM  Emily Farmer  has presented today for surgery, with the diagnosis of av node.  The various methods of treatment have been discussed with the patient and family. After consideration of risks, benefits and other options for treatment, the patient has consented to  Procedure(s): AV NODE ABLATION (N/A) as a surgical intervention.  The patient's history has been reviewed, patient examined, no change in status, stable for surgery.  I have reviewed the patient's chart and labs.  Questions were answered to the patient's satisfaction.     Lewayne Bunting

## 2023-01-05 ENCOUNTER — Encounter (HOSPITAL_COMMUNITY): Payer: Self-pay | Admitting: Internal Medicine

## 2023-01-09 ENCOUNTER — Other Ambulatory Visit: Payer: Self-pay

## 2023-01-12 ENCOUNTER — Other Ambulatory Visit: Payer: Self-pay

## 2023-01-12 DIAGNOSIS — H35033 Hypertensive retinopathy, bilateral: Secondary | ICD-10-CM | POA: Diagnosis not present

## 2023-01-12 DIAGNOSIS — E113511 Type 2 diabetes mellitus with proliferative diabetic retinopathy with macular edema, right eye: Secondary | ICD-10-CM | POA: Diagnosis not present

## 2023-01-12 LAB — HM DIABETES EYE EXAM

## 2023-01-13 ENCOUNTER — Other Ambulatory Visit: Payer: Self-pay

## 2023-01-13 ENCOUNTER — Encounter: Payer: Self-pay | Admitting: Pharmacist

## 2023-01-13 ENCOUNTER — Ambulatory Visit: Payer: Medicare HMO | Attending: Internal Medicine | Admitting: Pharmacist

## 2023-01-13 DIAGNOSIS — Z794 Long term (current) use of insulin: Secondary | ICD-10-CM

## 2023-01-13 DIAGNOSIS — Z7985 Long-term (current) use of injectable non-insulin antidiabetic drugs: Secondary | ICD-10-CM

## 2023-01-13 DIAGNOSIS — E1165 Type 2 diabetes mellitus with hyperglycemia: Secondary | ICD-10-CM | POA: Diagnosis not present

## 2023-01-13 DIAGNOSIS — Z7984 Long term (current) use of oral hypoglycemic drugs: Secondary | ICD-10-CM | POA: Diagnosis not present

## 2023-01-13 LAB — POCT GLYCOSYLATED HEMOGLOBIN (HGB A1C): HbA1c, POC (controlled diabetic range): 7.8 % — AB (ref 0.0–7.0)

## 2023-01-13 MED ORDER — ACCU-CHEK SOFTCLIX LANCETS MISC
3 refills | Status: DC
Start: 2023-01-13 — End: 2023-05-06
  Filled 2023-01-13: qty 100, 25d supply, fill #0

## 2023-01-13 MED ORDER — ACCU-CHEK GUIDE W/DEVICE KIT
PACK | 0 refills | Status: AC
Start: 2023-01-13 — End: ?
  Filled 2023-01-13: qty 1, 1d supply, fill #0

## 2023-01-13 MED ORDER — ACCU-CHEK GUIDE VI STRP
ORAL_STRIP | 2 refills | Status: DC
Start: 2023-01-13 — End: 2023-05-06
  Filled 2023-01-13: qty 100, 33d supply, fill #0
  Filled 2023-04-15: qty 100, 33d supply, fill #1

## 2023-01-13 NOTE — Progress Notes (Signed)
    S:    PCP: Dr. Laural Benes  67 y.o. female who presents for diabetes evaluation, education, and management.  PMH is significant for HTN, PAF, T2DM, and HLD. Patient was last seen by Primary Care Provider, Dr. Laural Benes, on 11/18/2022. Last seen by pharmacy clinic on 11/07/2022.   Today, patient arrives in good spirits and presents without any assistance. She is now using her Dexcom G7 consistently and brings this for review today.   Family Hx: diabetes (sister); heart attack (mom and dad) Social Hx: Denies use of tobacco and alcohol.  Current diabetes medications include: Lantus 55 units QPM, Humalog max daily dose of 45u (usually using once daily), Jardiance 25 mg daily, Victoza 1.8 mg daily, pioglitazone 15 mg daily    Insurance coverage: [employer]  Patient denies hypoglycemic events since last endocrinology visit.  Patient reports nocturia (nighttime urination).  Patient reports neuropathy (nerve pain). Patient reports visual changes. Patient reports self foot exams.   Patient reported dietary habits:  -Breakfast: scrambled eggs, 2 slices of bread, cheese  -Snacks: popcorn, low sugar ice cream -Dinner: ham and scallop potatoes -Beverages: water, drinks lemonade   Patient-reported exercise habits:  -Endorses walking for 1 hour daily.  O:  CGM data:  30 day report:  Avg: 185 mg/dL  In range: 84% Above: 69% Below: 3% GMI: 7.7%  Lab Results  Component Value Date   HGBA1C 7.8 (A) 01/13/2023   There were no vitals filed for this visit.  Lipid Panel     Component Value Date/Time   CHOL 351 (H) 11/18/2022 1216   TRIG 1,530 (HH) 11/18/2022 1216   HDL 31 (L) 11/18/2022 1216   CHOLHDL 11.3 (H) 11/18/2022 1216   CHOLHDL 4.0 04/21/2021 0143   VLDL 53 (H) 04/21/2021 0143   LDLCALC Comment (A) 11/18/2022 1216   LDLDIRECT 137 (H) 03/01/2020 1012   LDLDIRECT 96.0 09/23/2019 0828    Clinical Atherosclerotic Cardiovascular Disease (ASCVD): No  The ASCVD Risk score (Arnett DK,  et al., 2019) failed to calculate for the following reasons:   The valid total cholesterol range is 130 to 320 mg/dL   A/P: Diabetes longstanding currently above goal but drastically improved based on A1c. Her CGM data correlates with this. She is managed by Endocrine and her primary barrier is medication access. We will continue current management and have her defer to Endo for any changes. . Medication adherence reported. -Continued current regimen.  -Continued pioglitazone as prescribed by endocrinology. Of note, patient does have history of HF (EF 55-60% in Sept 2023). Patient denies symptoms of fluid overload/edema. Instructed patient to immediately report any changes.  -Extensively discussed pathophysiology of diabetes, recommended lifestyle interventions, dietary effects on blood sugar control.  -Counseled on s/sx of and management of hypoglycemia.  -Next A1c anticipated at next visit 03/2023.  Written patient instructions provided. Patient verbalized understanding of treatment plan.  Total time in face to face counseling 30 minutes.    Follow-up:  Me next month.   Butch Penny, PharmD, Patsy [place], CPP Clinical Pharmacist [facility] & [facility] [phone]

## 2023-01-14 ENCOUNTER — Ambulatory Visit: Payer: Self-pay | Admitting: *Deleted

## 2023-01-14 NOTE — Telephone Encounter (Signed)
Summary: A1C questions   Pt has questions for the clinic, she wants to discuss her A1C labs from yesterday  Best contact: [phone]     Patient called to get A1c level from visit yesterday:  HbA1c, POC (controlled diabetic range) 0.0 - 7.0 % 7.8   Reason for Disposition  Health Information question, no triage required and triager able to answer question  Answer Assessment - Initial Assessment Questions 1. REASON FOR CALL or QUESTION: "What is your reason for calling today?" or "How can I best help you?" or "What question do you have that I can help answer?"     Patient calling to get A1c result from yesterday's visit- Patient informed: 7.8  Protocols used: Information Only Call - No Triage-A-AH

## 2023-01-14 NOTE — Telephone Encounter (Signed)
  Chief Complaint: lab results  Disposition: [] ED /[] Urgent Care (no appt availability in office) / [] Appointment(In office/virtual)/ []  [facility]/ [x] Home Care/ [] Refused Recommended Disposition /[] [facility]/ []  Follow-up with PCP Additional Notes: Patient is just calling to get A1c level from yesterday- she was shocked it was so good- advised level 7.8

## 2023-01-19 ENCOUNTER — Other Ambulatory Visit: Payer: Self-pay

## 2023-01-20 DIAGNOSIS — H02836 Dermatochalasis of left eye, unspecified eyelid: Secondary | ICD-10-CM | POA: Diagnosis not present

## 2023-01-20 DIAGNOSIS — H35033 Hypertensive retinopathy, bilateral: Secondary | ICD-10-CM | POA: Diagnosis not present

## 2023-01-20 DIAGNOSIS — H02833 Dermatochalasis of right eye, unspecified eyelid: Secondary | ICD-10-CM | POA: Diagnosis not present

## 2023-01-20 DIAGNOSIS — E113512 Type 2 diabetes mellitus with proliferative diabetic retinopathy with macular edema, left eye: Secondary | ICD-10-CM | POA: Diagnosis not present

## 2023-01-20 LAB — HM DIABETES EYE EXAM

## 2023-01-21 ENCOUNTER — Ambulatory Visit: Payer: Medicare HMO | Attending: Cardiology

## 2023-01-21 DIAGNOSIS — I442 Atrioventricular block, complete: Secondary | ICD-10-CM

## 2023-01-21 DIAGNOSIS — I4819 Other persistent atrial fibrillation: Secondary | ICD-10-CM | POA: Diagnosis not present

## 2023-01-21 LAB — CUP PACEART INCLINIC DEVICE CHECK
Battery Remaining Longevity: 176 mo
Battery Voltage: 3.14 V
Brady Statistic RV Percent Paced: 99.73 %
Date Time Interrogation Session: 20240807142533
Implantable Lead Connection Status: 753985
Implantable Lead Implant Date: 20230918
Implantable Lead Location: 753860
Implantable Lead Model: 3830
Implantable Pulse Generator Implant Date: 20230918
Lead Channel Impedance Value: 399 Ohm
Lead Channel Impedance Value: 551 Ohm
Lead Channel Pacing Threshold Amplitude: 0.5 V
Lead Channel Pacing Threshold Pulse Width: 0.4 ms
Lead Channel Sensing Intrinsic Amplitude: 12.875 mV
Lead Channel Sensing Intrinsic Amplitude: 12.875 mV
Lead Channel Setting Pacing Amplitude: 2.5 V
Lead Channel Setting Pacing Pulse Width: 0.4 ms
Lead Channel Setting Sensing Sensitivity: 1.2 mV
Zone Setting Status: 755011

## 2023-01-21 NOTE — Progress Notes (Signed)
Patient in for device interrogation and LRL adjustment 2 weeks s/p AV node Ablation.  Patient is doing well today, no complaints or concerns.     Pacemaker check in clinic. Normal device function. Thresholds, sensing, impedances consistent with previous measurements. Device programmed to maximize longevity. No mode switch or high ventricular rates noted. Device programmed at appropriate safety margins. Histogram distribution appropriate for patient activity level. Device programmed to optimize intrinsic conduction. Estimated longevity 14.6 years. Patient enrolled in remote follow-up. Patient education completed. Patient scheduled to see A. Tillery, PA-C for further follow up on 02/02/23.   PROGRAMMING CHANGE MADE: LRL decreased from 90 bpm to 80bpm.

## 2023-01-21 NOTE — Patient Instructions (Signed)
We made an adjustment to your device programming today.  Decreased your lower rate limit from 90bpm to 80 bpm.  Your device is functioning very well.  No concerns today.  Thank you for your visit. Please keep your upcoming appointments and call us at [phone] if any future needs.

## 2023-01-23 ENCOUNTER — Other Ambulatory Visit: Payer: Self-pay

## 2023-01-23 ENCOUNTER — Other Ambulatory Visit: Payer: Self-pay | Admitting: Internal Medicine

## 2023-01-26 ENCOUNTER — Other Ambulatory Visit: Payer: Self-pay

## 2023-01-26 DIAGNOSIS — E113511 Type 2 diabetes mellitus with proliferative diabetic retinopathy with macular edema, right eye: Secondary | ICD-10-CM | POA: Diagnosis not present

## 2023-01-26 MED ORDER — EMPAGLIFLOZIN 25 MG PO TABS
25.0000 mg | ORAL_TABLET | Freq: Every day | ORAL | 1 refills | Status: DC
  Filled 2023-01-26: qty 90, 90d supply, fill #0

## 2023-01-28 ENCOUNTER — Other Ambulatory Visit: Payer: Self-pay

## 2023-01-29 ENCOUNTER — Other Ambulatory Visit: Payer: Self-pay

## 2023-01-30 ENCOUNTER — Ambulatory Visit
Admission: RE | Admit: 2023-01-30 | Discharge: 2023-01-30 | Disposition: A | Discharge status: Home / Self Care | Payer: Medicare HMO | Source: Ambulatory Visit | Attending: Internal Medicine | Admitting: Internal Medicine

## 2023-01-30 DIAGNOSIS — N958 Other specified menopausal and perimenopausal disorders: Secondary | ICD-10-CM | POA: Diagnosis not present

## 2023-01-30 DIAGNOSIS — Z780 Asymptomatic menopausal state: Secondary | ICD-10-CM

## 2023-01-30 DIAGNOSIS — N189 Chronic kidney disease, unspecified: Secondary | ICD-10-CM | POA: Diagnosis not present

## 2023-01-30 DIAGNOSIS — E349 Endocrine disorder, unspecified: Secondary | ICD-10-CM | POA: Diagnosis not present

## 2023-02-02 ENCOUNTER — Encounter: Payer: Self-pay | Admitting: Student

## 2023-02-02 ENCOUNTER — Ambulatory Visit: Payer: Medicare HMO | Attending: Student | Admitting: Student

## 2023-02-02 VITALS — BP 82/52 | HR 76 | Ht 63.0 in | Wt 171.4 lb

## 2023-02-02 DIAGNOSIS — I4819 Other persistent atrial fibrillation: Secondary | ICD-10-CM | POA: Diagnosis not present

## 2023-02-02 DIAGNOSIS — I5032 Chronic diastolic (congestive) heart failure: Secondary | ICD-10-CM

## 2023-02-02 DIAGNOSIS — I442 Atrioventricular block, complete: Secondary | ICD-10-CM | POA: Diagnosis not present

## 2023-02-02 LAB — CUP PACEART INCLINIC DEVICE CHECK
Battery Remaining Longevity: 173 mo
Battery Voltage: 3.13 V
Brady Statistic RV Percent Paced: 99.97 %
Date Time Interrogation Session: 20240819093240
Implantable Lead Connection Status: 753985
Implantable Lead Implant Date: 20230918
Implantable Lead Location: 753860
Implantable Lead Model: 3830
Implantable Pulse Generator Implant Date: 20230918
Lead Channel Impedance Value: 380 Ohm
Lead Channel Impedance Value: 532 Ohm
Lead Channel Pacing Threshold Amplitude: 0.5 V
Lead Channel Pacing Threshold Pulse Width: 0.4 ms
Lead Channel Sensing Intrinsic Amplitude: 12.875 mV
Lead Channel Sensing Intrinsic Amplitude: 12.875 mV
Lead Channel Setting Pacing Amplitude: 2.5 V
Lead Channel Setting Pacing Pulse Width: 0.4 ms
Lead Channel Setting Sensing Sensitivity: 1.2 mV
Zone Setting Status: 755011

## 2023-02-02 NOTE — Progress Notes (Signed)
  Electrophysiology Office Note:   ID:  Emily Farmer, Emily Farmer 1956/01/29, MRN [id number]  Primary Cardiologist: Chrystie Nose, MD Electrophysiologist: Lanier Prude, MD      History of Present Illness:   Emily Farmer is a 67 y.o. female with h/o tachy brady syndrome, uncontrolled AF, DM, HTN, HLD, h/o pancreatitis, and OSA seen today for routine electrophysiology followup.   Since last being seen in our clinic the patient reports doing about the same. SOB felt like it was getting better after procedure but has stagnated with arthritis making her less active. Otherwise, she denies chest pain, palpitations, dyspnea, PND, orthopnea, nausea, vomiting, syncope, edema, weight gain, or early satiety.  She has occasional lightheadedness with rapid standing.   Review of systems complete and found to be negative unless listed in HPI.   EP Information / Studies Reviewed:    EKG is not ordered today. EKG from 10/14/2022 reviewed which showed AF in 90s (pre AV nodal ablation)       PPM Interrogation-  reviewed in detail today,  See PACEART report.  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 for Tachy-Brady syndrome  Physical Exam:   VS:  BP (!) 82/52   Pulse 76   Ht 5\' 3"  (1.6 m)   Wt 171 lb 6.4 oz (77.7 kg)   SpO2 93%   BMI 30.36 kg/m    Wt Readings from Last 3 Encounters:  02/02/23 171 lb 6.4 oz (77.7 kg)  01/02/23 151 lb (68.5 kg)  12/08/22 164 lb (74.4 kg)     GEN: Well nourished, well developed in no acute distress NECK: No JVD; No carotid bruits CARDIAC: Regular rate and rhythm, no murmurs, rubs, gallops RESPIRATORY:  Clear to auscultation without rales, wheezing or rhonchi  ABDOMEN: Soft, non-tender, non-distended EXTREMITIES:  No edema; No deformity   ASSESSMENT AND PLAN:    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   Can stop digoxin now s/p AV nodal ablation  Chronic diastolic CHF Echo 02/2022 LVEF 64-40% Volume status stable.   HTN HYPOtensive on arrival. Took meds shortly before coming.  Will make 1 change at a time as she is currently asymptomatic Stop digoxin.  If BP remains low at home can decrease Toprol.   Disposition:   Follow up with Dr. Ladona Ridgel as scheduled  Signed, Graciella Freer, PA-C

## 2023-02-02 NOTE — Patient Instructions (Signed)
Medication Instructions:  Stop digoxin *If you need a refill on your cardiac medications before your next appointment, please call your pharmacy*  Lab Work: None ordered If you have labs (blood work) drawn today and your tests are completely normal, you will receive your results only by: MyChart Message (if you have MyChart) OR A paper copy in the mail If you have any lab test that is abnormal or we need to change your treatment, we will call you to review the results.  Follow-Up: At [facility], you and your health needs are our priority.  As part of our continuing mission to provide you with exceptional heart care, we have created designated Provider Care Teams.  These Care Teams include your primary Cardiologist (physician) and Advanced Practice Providers (APPs -  Physician Assistants and Nurse Practitioners) who all work together to provide you with the care you need, when you need it.  We recommend signing up for the patient portal called "MyChart".  Sign up information is provided on this After Visit Summary.  MyChart is used to connect with patients for Virtual Visits (Telemedicine).  Patients are able to view lab/test results, encounter notes, upcoming appointments, etc.  Non-urgent messages can be sent to your provider as well.   To learn more about what you can do with MyChart, go to [email or web address].    Your next appointment:   04/16/23 at 2:45 PM  Provider:   Lewayne Bunting, MD

## 2023-02-03 DIAGNOSIS — E113512 Type 2 diabetes mellitus with proliferative diabetic retinopathy with macular edema, left eye: Secondary | ICD-10-CM | POA: Diagnosis not present

## 2023-02-03 LAB — HM DIABETES EYE EXAM

## 2023-02-10 ENCOUNTER — Telehealth: Payer: Self-pay

## 2023-02-10 NOTE — Telephone Encounter (Signed)
Patient advised that order has been sent to [facility] for the G7 sensors.  She will stop by and pick up samples.

## 2023-02-11 NOTE — Telephone Encounter (Signed)
Patient picked up 2 boxes of Dexcom G7 samples today.

## 2023-02-23 DIAGNOSIS — H02833 Dermatochalasis of right eye, unspecified eyelid: Secondary | ICD-10-CM | POA: Diagnosis not present

## 2023-02-23 DIAGNOSIS — H35033 Hypertensive retinopathy, bilateral: Secondary | ICD-10-CM | POA: Diagnosis not present

## 2023-02-23 DIAGNOSIS — H02836 Dermatochalasis of left eye, unspecified eyelid: Secondary | ICD-10-CM | POA: Diagnosis not present

## 2023-02-23 DIAGNOSIS — E113511 Type 2 diabetes mellitus with proliferative diabetic retinopathy with macular edema, right eye: Secondary | ICD-10-CM | POA: Diagnosis not present

## 2023-02-27 ENCOUNTER — Ambulatory Visit: Payer: Medicare HMO | Admitting: Internal Medicine

## 2023-03-01 ENCOUNTER — Emergency Department (HOSPITAL_COMMUNITY): Payer: Medicare HMO

## 2023-03-01 ENCOUNTER — Other Ambulatory Visit: Payer: Self-pay

## 2023-03-01 ENCOUNTER — Encounter (HOSPITAL_COMMUNITY): Payer: Self-pay | Admitting: Emergency Medicine

## 2023-03-01 ENCOUNTER — Inpatient Hospital Stay (HOSPITAL_COMMUNITY)
Admission: EM | Admit: 2023-03-01 | Discharge: 2023-03-18 | DRG: 233 | Disposition: A | Discharge status: Skilled Nursing Facility | Payer: Medicare HMO | Attending: Surgery | Admitting: Surgery

## 2023-03-01 DIAGNOSIS — M6281 Muscle weakness (generalized): Secondary | ICD-10-CM | POA: Diagnosis not present

## 2023-03-01 DIAGNOSIS — Z4682 Encounter for fitting and adjustment of non-vascular catheter: Secondary | ICD-10-CM | POA: Diagnosis not present

## 2023-03-01 DIAGNOSIS — E113593 Type 2 diabetes mellitus with proliferative diabetic retinopathy without macular edema, bilateral: Secondary | ICD-10-CM | POA: Diagnosis present

## 2023-03-01 DIAGNOSIS — I2510 Atherosclerotic heart disease of native coronary artery without angina pectoris: Secondary | ICD-10-CM | POA: Diagnosis not present

## 2023-03-01 DIAGNOSIS — Z452 Encounter for adjustment and management of vascular access device: Secondary | ICD-10-CM | POA: Diagnosis not present

## 2023-03-01 DIAGNOSIS — Z01818 Encounter for other preprocedural examination: Secondary | ICD-10-CM | POA: Diagnosis not present

## 2023-03-01 DIAGNOSIS — Z888 Allergy status to other drugs, medicaments and biological substances status: Secondary | ICD-10-CM

## 2023-03-01 DIAGNOSIS — M109 Gout, unspecified: Secondary | ICD-10-CM | POA: Diagnosis present

## 2023-03-01 DIAGNOSIS — R918 Other nonspecific abnormal finding of lung field: Secondary | ICD-10-CM | POA: Diagnosis not present

## 2023-03-01 DIAGNOSIS — I5032 Chronic diastolic (congestive) heart failure: Secondary | ICD-10-CM | POA: Diagnosis not present

## 2023-03-01 DIAGNOSIS — Z5941 Food insecurity: Secondary | ICD-10-CM

## 2023-03-01 DIAGNOSIS — I429 Cardiomyopathy, unspecified: Secondary | ICD-10-CM | POA: Diagnosis present

## 2023-03-01 DIAGNOSIS — R0989 Other specified symptoms and signs involving the circulatory and respiratory systems: Secondary | ICD-10-CM | POA: Diagnosis not present

## 2023-03-01 DIAGNOSIS — J984 Other disorders of lung: Secondary | ICD-10-CM | POA: Diagnosis not present

## 2023-03-01 DIAGNOSIS — Z48812 Encounter for surgical aftercare following surgery on the circulatory system: Secondary | ICD-10-CM | POA: Diagnosis not present

## 2023-03-01 DIAGNOSIS — I480 Paroxysmal atrial fibrillation: Secondary | ICD-10-CM | POA: Diagnosis not present

## 2023-03-01 DIAGNOSIS — I5031 Acute diastolic (congestive) heart failure: Secondary | ICD-10-CM | POA: Diagnosis not present

## 2023-03-01 DIAGNOSIS — I442 Atrioventricular block, complete: Secondary | ICD-10-CM | POA: Diagnosis not present

## 2023-03-01 DIAGNOSIS — I25110 Atherosclerotic heart disease of native coronary artery with unstable angina pectoris: Principal | ICD-10-CM | POA: Diagnosis present

## 2023-03-01 DIAGNOSIS — R2681 Unsteadiness on feet: Secondary | ICD-10-CM | POA: Diagnosis not present

## 2023-03-01 DIAGNOSIS — I5033 Acute on chronic diastolic (congestive) heart failure: Secondary | ICD-10-CM | POA: Diagnosis present

## 2023-03-01 DIAGNOSIS — I700 Atherosclerosis of aorta: Secondary | ICD-10-CM | POA: Diagnosis present

## 2023-03-01 DIAGNOSIS — Z951 Presence of aortocoronary bypass graft: Secondary | ICD-10-CM | POA: Diagnosis not present

## 2023-03-01 DIAGNOSIS — E11649 Type 2 diabetes mellitus with hypoglycemia without coma: Secondary | ICD-10-CM | POA: Diagnosis not present

## 2023-03-01 DIAGNOSIS — Z7985 Long-term (current) use of injectable non-insulin antidiabetic drugs: Secondary | ICD-10-CM

## 2023-03-01 DIAGNOSIS — E871 Hypo-osmolality and hyponatremia: Secondary | ICD-10-CM | POA: Diagnosis not present

## 2023-03-01 DIAGNOSIS — E782 Mixed hyperlipidemia: Secondary | ICD-10-CM | POA: Diagnosis present

## 2023-03-01 DIAGNOSIS — I200 Unstable angina: Principal | ICD-10-CM | POA: Diagnosis present

## 2023-03-01 DIAGNOSIS — Z950 Presence of cardiac pacemaker: Secondary | ICD-10-CM | POA: Diagnosis not present

## 2023-03-01 DIAGNOSIS — M6259 Muscle wasting and atrophy, not elsewhere classified, multiple sites: Secondary | ICD-10-CM | POA: Diagnosis not present

## 2023-03-01 DIAGNOSIS — Z95810 Presence of automatic (implantable) cardiac defibrillator: Secondary | ICD-10-CM | POA: Diagnosis not present

## 2023-03-01 DIAGNOSIS — D631 Anemia in chronic kidney disease: Secondary | ICD-10-CM | POA: Diagnosis present

## 2023-03-01 DIAGNOSIS — N182 Chronic kidney disease, stage 2 (mild): Secondary | ICD-10-CM | POA: Diagnosis not present

## 2023-03-01 DIAGNOSIS — Z794 Long term (current) use of insulin: Secondary | ICD-10-CM | POA: Diagnosis not present

## 2023-03-01 DIAGNOSIS — N179 Acute kidney failure, unspecified: Secondary | ICD-10-CM | POA: Diagnosis present

## 2023-03-01 DIAGNOSIS — I25118 Atherosclerotic heart disease of native coronary artery with other forms of angina pectoris: Secondary | ICD-10-CM | POA: Diagnosis not present

## 2023-03-01 DIAGNOSIS — G4733 Obstructive sleep apnea (adult) (pediatric): Secondary | ICD-10-CM | POA: Diagnosis present

## 2023-03-01 DIAGNOSIS — Z7984 Long term (current) use of oral hypoglycemic drugs: Secondary | ICD-10-CM

## 2023-03-01 DIAGNOSIS — E1142 Type 2 diabetes mellitus with diabetic polyneuropathy: Secondary | ICD-10-CM | POA: Diagnosis present

## 2023-03-01 DIAGNOSIS — E1122 Type 2 diabetes mellitus with diabetic chronic kidney disease: Secondary | ICD-10-CM | POA: Diagnosis present

## 2023-03-01 DIAGNOSIS — T502X5A Adverse effect of carbonic-anhydrase inhibitors, benzothiadiazides and other diuretics, initial encounter: Secondary | ICD-10-CM | POA: Diagnosis not present

## 2023-03-01 DIAGNOSIS — R41841 Cognitive communication deficit: Secondary | ICD-10-CM | POA: Diagnosis not present

## 2023-03-01 DIAGNOSIS — I270 Primary pulmonary hypertension: Secondary | ICD-10-CM | POA: Diagnosis not present

## 2023-03-01 DIAGNOSIS — E1151 Type 2 diabetes mellitus with diabetic peripheral angiopathy without gangrene: Secondary | ICD-10-CM | POA: Diagnosis present

## 2023-03-01 DIAGNOSIS — E118 Type 2 diabetes mellitus with unspecified complications: Secondary | ICD-10-CM

## 2023-03-01 DIAGNOSIS — N1831 Chronic kidney disease, stage 3a: Secondary | ICD-10-CM | POA: Diagnosis not present

## 2023-03-01 DIAGNOSIS — I272 Pulmonary hypertension, unspecified: Secondary | ICD-10-CM | POA: Diagnosis not present

## 2023-03-01 DIAGNOSIS — R079 Chest pain, unspecified: Secondary | ICD-10-CM | POA: Diagnosis not present

## 2023-03-01 DIAGNOSIS — N1832 Chronic kidney disease, stage 3b: Secondary | ICD-10-CM | POA: Diagnosis present

## 2023-03-01 DIAGNOSIS — I130 Hypertensive heart and chronic kidney disease with heart failure and stage 1 through stage 4 chronic kidney disease, or unspecified chronic kidney disease: Secondary | ICD-10-CM | POA: Diagnosis not present

## 2023-03-01 DIAGNOSIS — R069 Unspecified abnormalities of breathing: Secondary | ICD-10-CM | POA: Diagnosis not present

## 2023-03-01 DIAGNOSIS — I5043 Acute on chronic combined systolic (congestive) and diastolic (congestive) heart failure: Secondary | ICD-10-CM | POA: Diagnosis not present

## 2023-03-01 DIAGNOSIS — I2721 Secondary pulmonary arterial hypertension: Secondary | ICD-10-CM | POA: Diagnosis present

## 2023-03-01 DIAGNOSIS — J939 Pneumothorax, unspecified: Secondary | ICD-10-CM | POA: Diagnosis not present

## 2023-03-01 DIAGNOSIS — I951 Orthostatic hypotension: Secondary | ICD-10-CM | POA: Diagnosis not present

## 2023-03-01 DIAGNOSIS — Z6832 Body mass index (BMI) 32.0-32.9, adult: Secondary | ICD-10-CM

## 2023-03-01 DIAGNOSIS — Z833 Family history of diabetes mellitus: Secondary | ICD-10-CM

## 2023-03-01 DIAGNOSIS — Z7401 Bed confinement status: Secondary | ICD-10-CM | POA: Diagnosis not present

## 2023-03-01 DIAGNOSIS — J918 Pleural effusion in other conditions classified elsewhere: Secondary | ICD-10-CM | POA: Diagnosis not present

## 2023-03-01 DIAGNOSIS — J95811 Postprocedural pneumothorax: Secondary | ICD-10-CM | POA: Diagnosis not present

## 2023-03-01 DIAGNOSIS — Z741 Need for assistance with personal care: Secondary | ICD-10-CM | POA: Diagnosis not present

## 2023-03-01 DIAGNOSIS — Z1152 Encounter for screening for COVID-19: Secondary | ICD-10-CM | POA: Diagnosis not present

## 2023-03-01 DIAGNOSIS — R531 Weakness: Secondary | ICD-10-CM | POA: Diagnosis not present

## 2023-03-01 DIAGNOSIS — N390 Urinary tract infection, site not specified: Secondary | ICD-10-CM | POA: Diagnosis not present

## 2023-03-01 DIAGNOSIS — I4821 Permanent atrial fibrillation: Secondary | ICD-10-CM | POA: Diagnosis present

## 2023-03-01 DIAGNOSIS — J90 Pleural effusion, not elsewhere classified: Secondary | ICD-10-CM | POA: Diagnosis not present

## 2023-03-01 DIAGNOSIS — I495 Sick sinus syndrome: Secondary | ICD-10-CM | POA: Diagnosis present

## 2023-03-01 DIAGNOSIS — R9389 Abnormal findings on diagnostic imaging of other specified body structures: Secondary | ICD-10-CM | POA: Diagnosis not present

## 2023-03-01 DIAGNOSIS — E1165 Type 2 diabetes mellitus with hyperglycemia: Secondary | ICD-10-CM | POA: Diagnosis present

## 2023-03-01 DIAGNOSIS — I259 Chronic ischemic heart disease, unspecified: Secondary | ICD-10-CM | POA: Diagnosis not present

## 2023-03-01 DIAGNOSIS — Z01810 Encounter for preprocedural cardiovascular examination: Secondary | ICD-10-CM | POA: Diagnosis not present

## 2023-03-01 DIAGNOSIS — Z8249 Family history of ischemic heart disease and other diseases of the circulatory system: Secondary | ICD-10-CM

## 2023-03-01 DIAGNOSIS — Z6830 Body mass index (BMI) 30.0-30.9, adult: Secondary | ICD-10-CM

## 2023-03-01 DIAGNOSIS — E669 Obesity, unspecified: Secondary | ICD-10-CM | POA: Diagnosis present

## 2023-03-01 DIAGNOSIS — Z79899 Other long term (current) drug therapy: Secondary | ICD-10-CM

## 2023-03-01 DIAGNOSIS — Z7982 Long term (current) use of aspirin: Secondary | ICD-10-CM

## 2023-03-01 DIAGNOSIS — J9811 Atelectasis: Secondary | ICD-10-CM | POA: Diagnosis not present

## 2023-03-01 DIAGNOSIS — Z5986 Financial insecurity: Secondary | ICD-10-CM

## 2023-03-01 DIAGNOSIS — R7989 Other specified abnormal findings of blood chemistry: Secondary | ICD-10-CM | POA: Diagnosis not present

## 2023-03-01 DIAGNOSIS — Z7901 Long term (current) use of anticoagulants: Secondary | ICD-10-CM

## 2023-03-01 LAB — GLUCOSE, CAPILLARY
Glucose-Capillary: 260 mg/dL — ABNORMAL HIGH (ref 70–99)
Glucose-Capillary: 196 mg/dL — ABNORMAL HIGH (ref 70–99)

## 2023-03-01 LAB — BASIC METABOLIC PANEL
Anion gap: 10 (ref 5–15)
BUN: 36 mg/dL — ABNORMAL HIGH (ref 8–23)
CO2: 19 mmol/L — ABNORMAL LOW (ref 22–32)
Calcium: 8.8 mg/dL — ABNORMAL LOW (ref 8.9–10.3)
Chloride: 106 mmol/L (ref 98–111)
Creatinine, Ser: 1.21 mg/dL — ABNORMAL HIGH (ref 0.44–1.00)
GFR, Estimated: 50 mL/min — ABNORMAL LOW (ref >60)
Glucose, Bld: 241 mg/dL — ABNORMAL HIGH (ref 70–99)
Potassium: 4.2 mmol/L (ref 3.5–5.1)
Sodium: 135 mmol/L (ref 135–145)

## 2023-03-01 LAB — CBC
HCT: 33.5 % — ABNORMAL LOW (ref 36.0–46.0)
Hemoglobin: 10.6 g/dL — ABNORMAL LOW (ref 12.0–15.0)
MCH: 29.3 pg (ref 26.0–34.0)
MCHC: 31.6 g/dL (ref 30.0–36.0)
MCV: 92.5 fL (ref 80.0–100.0)
Platelets: 164 10*3/uL (ref 150–400)
RBC: 3.62 MIL/uL — ABNORMAL LOW (ref 3.87–5.11)
RDW: 15.6 % — ABNORMAL HIGH (ref 11.5–15.5)
WBC: 6.9 10*3/uL (ref 4.0–10.5)
nRBC: 0 % (ref 0.0–0.2)

## 2023-03-01 LAB — TROPONIN I (HIGH SENSITIVITY)
Troponin I (High Sensitivity): 19 ng/L — ABNORMAL HIGH (ref <18)
Troponin I (High Sensitivity): 29 ng/L — ABNORMAL HIGH (ref <18)

## 2023-03-01 MED ORDER — ONDANSETRON HCL 4 MG/2ML IJ SOLN: 4.0000 mg | Freq: Four times a day (QID) | INTRAMUSCULAR | Status: DC | PRN

## 2023-03-01 MED ORDER — FERROUS SULFATE 325 (65 FE) MG PO TABS
325.0000 mg | ORAL_TABLET | Freq: Every day | ORAL | Status: DC
  Administered 2023-03-02 – 2023-03-18 (×16): 325 mg via ORAL
  Filled 2023-03-01 (×16): qty 1

## 2023-03-01 MED ORDER — FENOFIBRATE 160 MG PO TABS
160.0000 mg | ORAL_TABLET | Freq: Every day | ORAL | Status: DC
  Administered 2023-03-02 – 2023-03-18 (×16): 160 mg via ORAL
  Filled 2023-03-01 (×17): qty 1

## 2023-03-01 MED ORDER — INSULIN LISPRO (1 UNIT DIAL) 100 UNIT/ML (KWIKPEN): 12.0000 [IU] | PEN_INJECTOR | Freq: Three times a day (TID) | SUBCUTANEOUS | Status: DC

## 2023-03-01 MED ORDER — DILTIAZEM HCL ER COATED BEADS 120 MG PO CP24
120.0000 mg | ORAL_CAPSULE | Freq: Every day | ORAL | Status: DC
  Administered 2023-03-01 – 2023-03-03 (×3): 120 mg via ORAL
  Filled 2023-03-01 (×3): qty 1

## 2023-03-01 MED ORDER — ISOSORBIDE MONONITRATE ER 30 MG PO TB24
30.0000 mg | ORAL_TABLET | Freq: Every day | ORAL | Status: DC
  Administered 2023-03-01 – 2023-03-03 (×3): 30 mg via ORAL
  Filled 2023-03-01 (×3): qty 1

## 2023-03-01 MED ORDER — INSULIN ASPART 100 UNIT/ML IJ SOLN
0.0000 [IU] | Freq: Three times a day (TID) | INTRAMUSCULAR | Status: DC
  Administered 2023-03-01: 8 [IU] via SUBCUTANEOUS
  Administered 2023-03-02: 3 [IU] via SUBCUTANEOUS
  Administered 2023-03-02: 8 [IU] via SUBCUTANEOUS
  Administered 2023-03-05 – 2023-03-06 (×2): 3 [IU] via SUBCUTANEOUS
  Administered 2023-03-07: 5 [IU] via SUBCUTANEOUS
  Administered 2023-03-07: 3 [IU] via SUBCUTANEOUS
  Administered 2023-03-08: 5 [IU] via SUBCUTANEOUS
  Administered 2023-03-08: 3 [IU] via SUBCUTANEOUS

## 2023-03-01 MED ORDER — NITROGLYCERIN 0.4 MG SL SUBL
0.4000 mg | SUBLINGUAL_TABLET | SUBLINGUAL | Status: DC | PRN
  Administered 2023-03-03 (×4): 0.4 mg via SUBLINGUAL
  Filled 2023-03-01 (×3): qty 1

## 2023-03-01 MED ORDER — SODIUM CHLORIDE 0.9 % IV SOLN: INTRAVENOUS | Status: DC

## 2023-03-01 MED ORDER — METOPROLOL SUCCINATE ER 100 MG PO TB24
100.0000 mg | ORAL_TABLET | Freq: Every day | ORAL | Status: DC
  Administered 2023-03-02 – 2023-03-08 (×7): 100 mg via ORAL
  Filled 2023-03-01 (×7): qty 1

## 2023-03-01 MED ORDER — ICOSAPENT ETHYL 1 G PO CAPS
2.0000 g | ORAL_CAPSULE | Freq: Two times a day (BID) | ORAL | Status: DC
  Administered 2023-03-01 – 2023-03-04 (×7): 2 g via ORAL
  Filled 2023-03-01 (×8): qty 2

## 2023-03-01 MED ORDER — EMPAGLIFLOZIN 25 MG PO TABS
25.0000 mg | ORAL_TABLET | Freq: Every day | ORAL | Status: DC
  Administered 2023-03-02: 25 mg via ORAL
  Filled 2023-03-01: qty 1

## 2023-03-01 MED ORDER — APIXABAN 5 MG PO TABS
5.0000 mg | ORAL_TABLET | Freq: Two times a day (BID) | ORAL | Status: DC
  Administered 2023-03-01 – 2023-03-02 (×2): 5 mg via ORAL
  Filled 2023-03-01 (×2): qty 1

## 2023-03-01 MED ORDER — LIRAGLUTIDE 18 MG/3ML ~~LOC~~ SOPN: 1.8000 mg | PEN_INJECTOR | Freq: Every day | SUBCUTANEOUS | Status: DC

## 2023-03-01 MED ORDER — ATORVASTATIN CALCIUM 40 MG PO TABS
40.0000 mg | ORAL_TABLET | Freq: Every day | ORAL | Status: DC
  Administered 2023-03-01 – 2023-03-18 (×17): 40 mg via ORAL
  Filled 2023-03-01 (×4): qty 1
  Filled 2023-03-01: qty 4
  Filled 2023-03-01 (×12): qty 1

## 2023-03-01 MED ORDER — INSULIN ASPART 100 UNIT/ML IJ SOLN: 0.0000 [IU] | Freq: Every day | INTRAMUSCULAR | Status: DC

## 2023-03-01 MED ORDER — ACETAMINOPHEN 325 MG PO TABS
650.0000 mg | ORAL_TABLET | ORAL | Status: DC | PRN
  Administered 2023-03-01 – 2023-03-08 (×12): 650 mg via ORAL
  Filled 2023-03-01 (×11): qty 2

## 2023-03-01 MED ORDER — ASPIRIN 81 MG PO TBEC
81.0000 mg | DELAYED_RELEASE_TABLET | Freq: Every day | ORAL | Status: DC
  Administered 2023-03-02 – 2023-03-08 (×5): 81 mg via ORAL
  Filled 2023-03-01 (×6): qty 1

## 2023-03-01 NOTE — Plan of Care (Signed)
  Problem: Education: Goal: Knowledge of cardiac device and self-care will improve Outcome: Progressing Goal: Ability to safely manage health related needs after discharge will improve Outcome: Progressing Goal: Individualized Educational Video(s) Outcome: Progressing   Problem: Cardiac: Goal: Ability to achieve and maintain adequate cardiopulmonary perfusion will improve Outcome: Progressing   Problem: Education: Goal: Understanding of disease, treatment, and recovery process will improve Outcome: Progressing   Problem: Activity: Goal: Ability to return to baseline activity level will improve Outcome: Progressing   Problem: Cardiac: Goal: Ability to maintain adequate cardiovascular perfusion will improve Outcome: Progressing Goal: Vascular access site(s) Level 0-1 will be maintained Outcome: Progressing   Problem: Health Behavior/ Discharge Planning: Goal: Ability to safely manage health related needs after discharge Outcome: Progressing   Problem: Education: Goal: Understanding of cardiac disease, CV risk reduction, and recovery process will improve Outcome: Progressing Goal: Individualized Educational Video(s) Outcome: Progressing   Problem: Activity: Goal: Ability to tolerate increased activity will improve Outcome: Progressing   Problem: Cardiac: Goal: Ability to achieve and maintain adequate cardiovascular perfusion will improve Outcome: Progressing   Problem: Health Behavior/Discharge Planning: Goal: Ability to safely manage health-related needs after discharge will improve Outcome: Progressing   Problem: Education: Goal: Ability to describe self-care measures that may prevent or decrease complications (Diabetes Survival Skills Education) will improve Outcome: Progressing Goal: Individualized Educational Video(s) Outcome: Progressing   Problem: Coping: Goal: Ability to adjust to condition or change in health will improve Outcome: Progressing   Problem:  Fluid Volume: Goal: Ability to maintain a balanced intake and output will improve Outcome: Progressing   Problem: Health Behavior/Discharge Planning: Goal: Ability to identify and utilize available resources and services will improve Outcome: Progressing Goal: Ability to manage health-related needs will improve Outcome: Progressing   Problem: Metabolic: Goal: Ability to maintain appropriate glucose levels will improve Outcome: Progressing   Problem: Nutritional: Goal: Maintenance of adequate nutrition will improve Outcome: Progressing Goal: Progress toward achieving an optimal weight will improve Outcome: Progressing   Problem: Skin Integrity: Goal: Risk for impaired skin integrity will decrease Outcome: Progressing   Problem: Tissue Perfusion: Goal: Adequacy of tissue perfusion will improve Outcome: Progressing   Problem: Education: Goal: Knowledge of General Education information will improve Description: Including pain rating scale, medication(s)/side effects and non-pharmacologic comfort measures Outcome: Progressing   Problem: Health Behavior/Discharge Planning: Goal: Ability to manage health-related needs will improve Outcome: Progressing   Problem: Clinical Measurements: Goal: Ability to maintain clinical measurements within normal limits will improve Outcome: Progressing Goal: Will remain free from infection Outcome: Progressing Goal: Diagnostic test results will improve Outcome: Progressing Goal: Respiratory complications will improve Outcome: Progressing Goal: Cardiovascular complication will be avoided Outcome: Progressing   Problem: Activity: Goal: Risk for activity intolerance will decrease Outcome: Progressing   Problem: Nutrition: Goal: Adequate nutrition will be maintained Outcome: Progressing   Problem: Coping: Goal: Level of anxiety will decrease Outcome: Progressing   Problem: Elimination: Goal: Will not experience complications related  to bowel motility Outcome: Progressing Goal: Will not experience complications related to urinary retention Outcome: Progressing   Problem: Pain Managment: Goal: General experience of comfort will improve Outcome: Progressing   Problem: Safety: Goal: Ability to remain free from injury will improve Outcome: Progressing   Problem: Skin Integrity: Goal: Risk for impaired skin integrity will decrease Outcome: Progressing

## 2023-03-01 NOTE — ED Provider Notes (Signed)
[place] EMERGENCY DEPARTMENT AT [facility] Provider Note   CSN: [id number] Arrival date & time: 03/01/23  3474     History  No chief complaint on file.   Emily Farmer is a 67 y.o. female.  The history is provided by the patient, the EMS personnel and medical records. No language interpreter was used.     67 year old female significant history of A-fib currently on Eliquis, diabetes, CHF, hypertension, CAD brought here via EMS from home with complaints of chest pain.  Patient report several weeks ago where she was seen by her cardiologist and had her pacemaker setting set to have a heart rate around 70.  Since then she noticed whenever she exerts herself she was experiencing chest discomfort.  States when she walks a long distance she endorsed having pain across the chest, feeling lightheadedness, break out in sweat and having trouble catching her breath.  This morning while she was walking out to her mailbox, chest pain intensified and she was having trouble breathing.  She has to flag down a neighbor to help her back into her house.  At rest she feels much better.  She denies any fever no runny nose sneezing or coughing no hemoptysis no abdominal pain or back pain.  No prior history of MI.  She does have a Medtronic pacemaker.  Home Medications Prior to Admission medications   Medication Sig Start Date End Date Taking? Authorizing Provider  Accu-Chek Softclix Lancets lancets Use to check blood sugar 3 times daily. 01/13/23   Marcine Matar, MD  acetaminophen (TYLENOL) 325 MG tablet Take 2 tablets (650 mg total) by mouth every 6 (six) hours as needed for mild pain (or Fever >/= 101). 02/01/18   Elgergawy, Leana Roe, MD  apixaban (ELIQUIS) 5 MG TABS tablet Take 1 tablet (5 mg total) by mouth in the morning and at bedtime. 11/07/22   Marcine Matar, MD  atorvastatin (LIPITOR) 40 MG tablet Take 1 tablet (40 mg total) by mouth daily. 06/23/22   Marcine Matar, MD  Blood  Glucose Monitoring Suppl (ACCU-CHEK GUIDE) w/Device KIT Use to check blood sugar 3 times daily. 01/13/23   Marcine Matar, MD  Blood Pressure Monitoring (BLOOD PRESSURE MONITOR/ARM) DEVI Check blood pressure once per day at least 2 hours after medications. 07/22/21   Alver Sorrow, NP  ciclopirox (PENLAC) 8 % solution Apply topically at bedtime. Apply over nail and surrounding skin. Apply daily over previous coat. After seven (7) days, may remove with alcohol and continue cycle. 08/29/22   Vivi Barrack, DPM  Continuous Blood Gluc Sensor (DEXCOM G7 SENSOR) MISC 1 Device by Does not apply route as directed. 08/22/22   Shamleffer, Konrad Dolores, MD  diclofenac Sodium (VOLTAREN) 1 % GEL Apply 2 g topically 4 (four) times daily. Apply across lower back for back pain Patient taking differently: Apply 2 g topically 4 (four) times daily as needed (pain). Apply across lower back for back pain 11/18/22   Marcine Matar, MD  diltiazem (CARDIZEM CD) 120 MG 24 hr capsule Take 1 capsule (120 mg total) by mouth daily. 06/03/22   Marinus Maw, MD  empagliflozin (JARDIANCE) 25 MG TABS tablet Take 1 tablet (25 mg total) by mouth daily before breakfast. 01/26/23   Shamleffer, Konrad Dolores, MD  fenofibrate (TRICOR) 48 MG tablet Take 1 tablet (48 mg total) by mouth daily. 11/19/22   Marcine Matar, MD  ferrous sulfate 325 (65 FE) MG tablet Take 1 tablet (  325 mg total) by mouth daily with breakfast. 04/11/21 12/30/22  Anders Simmonds, PA-C  glucose blood (ACCU-CHEK GUIDE) test strip Use to check blood sugar 3 times daily. 01/13/23   Marcine Matar, MD  insulin glargine (LANTUS SOLOSTAR) 100 UNIT/ML Solostar Pen Inject 55 Units into the skin daily. 11/18/22   Marcine Matar, MD  insulin lispro (HUMALOG KWIKPEN) 100 UNIT/ML KwikPen Max daily 45 units Patient taking differently: Inject 12 Units into the skin with breakfast, with lunch, and with evening meal. 08/22/22   Shamleffer, Konrad Dolores, MD   Insulin Pen Needle 32G X 4 MM MISC use in the morning, at noon, in the evening, and at bedtime. 08/22/22   Shamleffer, Konrad Dolores, MD  Insulin Syringe-Needle U-100 (TRUEPLUS INSULIN SYRINGE) 31G X 5/16" 0.3 ML MISC Use to inject insulin daily. Patient taking differently: 1 each by Other route See admin instructions. Use to inject insulin daily. 05/06/18   Fulp, Cammie, MD  liraglutide (VICTOZA) 18 MG/3ML SOPN Inject 1.8 mg into the skin daily. 08/22/22   Shamleffer, Konrad Dolores, MD  metoprolol succinate (TOPROL-XL) 100 MG 24 hr tablet Take 1 tablet (100 mg total) by mouth daily. Take with or immediately following a meal. 03/04/22   Sherie Don, NP  Multiple Vitamins-Minerals (CENTRUM SILVER PO) Take 1 tablet by mouth daily.    [provider]  Multiple Vitamins-Minerals (OCUVITE ADULT 50+) CAPS Take 1 capsule by mouth daily.    [provider]  pioglitazone (ACTOS) 15 MG tablet Take 1 tablet (15 mg total) by mouth daily. 08/22/22   Shamleffer, Konrad Dolores, MD      Allergies    Trulicity [dulaglutide]    Review of Systems   Review of Systems  All other systems reviewed and are negative.   Physical Exam Updated Vital Signs BP (!) 161/81   Pulse 69   Temp 97.8 F (36.6 C) (Oral)   Resp (!) 23   Ht 5\' 3"  (1.6 m)   Wt 78.9 kg   SpO2 96%   BMI 30.82 kg/m  Physical Exam Vitals and nursing note reviewed.  Constitutional:      General: She is not in acute distress.    Appearance: She is well-developed.  HENT:     Head: Atraumatic.  Eyes:     Conjunctiva/sclera: Conjunctivae normal.  Cardiovascular:     Rate and Rhythm: Normal rate. Rhythm irregular.  Pulmonary:     Effort: Pulmonary effort is normal.     Breath sounds: No wheezing, rhonchi or rales.  Abdominal:     Palpations: Abdomen is soft.     Tenderness: There is no abdominal tenderness.  Musculoskeletal:     Cervical back: Neck supple.     Right lower leg: No edema.     Left lower leg: No  edema.  Skin:    Findings: No rash.  Neurological:     Mental Status: She is alert. Mental status is at baseline.  Psychiatric:        Mood and Affect: Mood normal.     ED Results / Procedures / Treatments   Labs (all labs ordered are listed, but only abnormal results are displayed) Labs Reviewed  BASIC METABOLIC PANEL - Abnormal; Notable for the following components:      Result Value   CO2 19 (*)    Glucose, Bld 241 (*)    BUN 36 (*)    Creatinine, Ser 1.21 (*)    Calcium 8.8 (*)    GFR,  Estimated 50 (*)    All other components within normal limits  CBC - Abnormal; Notable for the following components:   RBC 3.62 (*)    Hemoglobin 10.6 (*)    HCT 33.5 (*)    RDW 15.6 (*)    All other components within normal limits  TROPONIN I (HIGH SENSITIVITY) - Abnormal; Notable for the following components:   Troponin I (High Sensitivity) 19 (*)    All other components within normal limits  TROPONIN I (HIGH SENSITIVITY)    EKG None ED ECG REPORT   Date: 03/01/2023  Rate: 71  Rhythm:  accelerated junctional rhyth m, paced rhythm  QRS Axis: normal  Intervals: normal  ST/T Wave abnormalities: nonspecific ST changes  Conduction Disutrbances:none, incomplete RBBB  Narrative Interpretation:   Old EKG Reviewed: unchanged  I have personally reviewed the EKG tracing and agree with the computerized printout as noted.   Radiology DG Chest Port 1 View  Result Date: 03/01/2023 CLINICAL DATA:  Chest pain EXAM: PORTABLE CHEST 1 VIEW COMPARISON:  02/22/2022 FINDINGS: Left chest wall pacer with lead in the right ventricle. Normal heart size. Slight asymmetric elevation of the right hemidiaphragm with overlying pleuroparenchymal scarring and surgical clips. No pleural effusion, interstitial edema or airspace disease. IMPRESSION: 1. No acute findings. 2. Right basilar pleuroparenchymal scarring with postsurgical changes. Electronically Signed   By: Signa Kell M.D.   On: 03/01/2023 09:53     Procedures Procedures    Medications Ordered in ED Medications  insulin aspart (novoLOG) injection 0-15 Units (has no administration in time range)  isosorbide mononitrate (IMDUR) 24 hr tablet 30 mg (has no administration in time range)  icosapent Ethyl (VASCEPA) 1 g capsule 2 g (has no administration in time range)    ED Course/ Medical Decision Making/ A&P                                 Medical Decision Making Amount and/or Complexity of Data Reviewed Labs: ordered. Radiology: ordered.  Risk Decision regarding hospitalization.   BP (!) 141/66 (BP Location: Right Arm)   Pulse 72   Temp 97.8 F (36.6 C) (Oral)   Resp 17   SpO2 95%   62:28 AM  67 year old female significant history of tachy/brady syndrome s/p medtronic pacemaker, A-fib currently on Eliquis, diabetes, CHF, hypertension, CAD brought here via EMS from home with complaints of chest pain.  Patient report several weeks ago where she was seen by her cardiologist and had her pacemaker setting set to have a heart rate around 70.  Since then she noticed whenever she exerts herself she was experiencing chest discomfort.  States when she walks a long distance she endorsed having pain across the chest, feeling lightheadedness, break out in sweat and having trouble catching her breath.  This morning while she was walking out to her mailbox, chest pain intensified and she was having trouble breathing.  She has to flag down a neighbor to help her back into her house.  At rest she feels much better.  She denies any fever no runny nose sneezing or coughing no hemoptysis no abdominal pain or back pain.  No prior history of MI.  She does have a Medtronic pacemaker.  On exam elderly female resting comfortably in bed appears to be in no acute discomfort.  Heart with a regular rhythm no murmur rubs or gallops, lungs clear to auscultation bilaterally abdomen is soft nontender, able  to move all 4 extremities.  On pacemaker on left upper  chest wall without signs of infection.  -Labs ordered, independently viewed and interpreted by me.  Labs remarkable for Trop 19.  CBG 241, Cr 1.21 -The patient was maintained on a cardiac monitor.  I personally viewed and interpreted the cardiac monitored which showed an underlying rhythm of: accelerated junctional rhythm, paced rhythm -Imaging independently viewed and interpreted by me and I agree with radiologist's interpretation.  Result remarkable for CXR without acute changes -This patient presents to the ED for concern of chest pain, this involves an extensive number of treatment options, and is a complaint that carries with it a high risk of complications and morbidity.  The differential diagnosis includes rate dependent chest discomfort, MI, angina, gerd, msk -Co morbidities that complicate the patient evaluation includes pace maker, HTN, afib, CHF -Treatment includes monitor -Reevaluation of the patient after these medicines showed that the patient improved -PCP office notes or outside notes reviewed -Discussion with specialist cardiologist Dr. Antonieta Pert who will evaluate pt and determine disposition.  -Escalation to admission/observation considered: cardiology will admit pt         Final Clinical Impression(s) / ED Diagnoses Final diagnoses:  Accelerating angina [facility])    Rx / DC Orders ED Discharge Orders     None         Fayrene Helper, PA-C 03/01/23 1259    Estelle June A, DO 03/02/23 0725

## 2023-03-01 NOTE — ED Notes (Signed)
ED TO INPATIENT HANDOFF REPORT  ED Nurse Name and Phone #: Josh-RN  S Name/Age/Gender Emily Farmer 67 y.o. female Room/Bed: 027C/027C  Code Status   Code Status: Full Code  Home/SNF/Other Home Patient oriented to: self, place, time, and situation Is this baseline? Yes   Triage Complete: Triage complete  Chief Complaint Accelerating angina [facility]) [I20.0]  Triage Note Pt BIB [facility] for CP.  Pt was walking back form the mailbox and develoved 8/10 chest pressure and SOB and became pale. EMS gave 1 nitroglycerin which helped with pain.  Currently 2/10.   Pt takes Eliquis.  EMS did not give ASA b/c pt stated that doctor had told her not to take it.  PT was sinus on monitor for EMS.   156/84 96% RA, 78-80 HR,     Allergies Allergies  Allergen Reactions   Trulicity [Dulaglutide] Diarrhea    Level of Care/Admitting Diagnosis ED Disposition     ED Disposition  Admit   Condition  --   Comment  Hospital Area: [facility] [[id number]]  Level of Care: Telemetry Cardiac [[id number]]  May place patient in observation at [facility] or [facility] if equivalent level of care is available:: No  Covid Evaluation: Asymptomatic - no recent exposure (last 10 days) testing not required  Diagnosis: Accelerating angina [facility]) [[id number]]  Admitting Physician: Thurmon Fair [[id number]]  Attending Physician: Thurmon Fair [[id number]]          B Medical/Surgery History Past Medical History:  Diagnosis Date   Arthritis    Atrial fibrillation (HCC)    a. 09/2016 in setting of pancreatitis;  b. 09/2016 Echo: EF 65-60%, no rwma, Gr2 DD, mild MR, triv TR, PASP ;  CHA2DS2VASc = 4-->Eliquis 5mg  BID. c. Recurred 06/2018 in setting of GIB.   Chronic diastolic CHF (congestive heart failure) (HCC) 01/28/2018   Diabetes mellitus    GI bleeding 06/2018   Gout    Hyperlipidemia    Hypertension    Hypertriglyceridemia    Pancreatitis    a. 09/2016 - Triglycerides 1,392 on admission.   Past  Surgical History:  Procedure Laterality Date   AV NODE ABLATION N/A 01/02/2023   Procedure: AV NODE ABLATION;  Surgeon: Marinus Maw, MD;  Location: [facility] INVASIVE CV LAB;  Service: Cardiovascular;  Laterality: N/A;   BIOPSY  06/20/2018   Procedure: BIOPSY;  Surgeon: Vida Rigger, MD;  Location: [facility] ENDOSCOPY;  Service: Endoscopy;;   CHOLECYSTECTOMY N/A 01/04/2014   Procedure: LAPAROSCOPIC CHOLECYSTECTOMY WITH INTRAOPERATIVE CHOLANGIOGRAM;  Surgeon: Axel Filler, MD;  Location: [facility] OR;  Service: General;  Laterality: N/A;   COLONOSCOPY WITH PROPOFOL N/A 06/21/2018   Procedure: COLONOSCOPY WITH PROPOFOL;  Surgeon: Bernette Redbird, MD;  Location: [facility] ENDOSCOPY;  Service: Endoscopy;  Laterality: N/A;   ESOPHAGOGASTRODUODENOSCOPY (EGD) WITH PROPOFOL N/A 06/20/2018   Procedure: ESOPHAGOGASTRODUODENOSCOPY (EGD) WITH PROPOFOL;  Surgeon: Vida Rigger, MD;  Location: [facility] ENDOSCOPY;  Service: Endoscopy;  Laterality: N/A;   LUNG SURGERY     PACEMAKER IMPLANT N/A 03/03/2022   Procedure: PACEMAKER IMPLANT;  Surgeon: Marinus Maw, MD;  Location: [facility] INVASIVE CV LAB;  Service: Cardiovascular;  Laterality: N/A;     A IV Location/Drains/Wounds Patient Lines/Drains/Airways Status     Active Line/Drains/Airways     Name Placement date Placement time Site Days   Peripheral IV 03/01/23 20 G Left Antecubital 03/01/23  0931  Antecubital  less than 1            Intake/Output Last 24 hours No intake  or output data in the 24 hours ending 03/01/23 1342  Labs/Imaging Results for orders placed or performed during the hospital encounter of 03/01/23 (from the past 48 hour(s))  Basic metabolic panel     Status: Abnormal   Collection Time: 03/01/23  9:54 AM  Result Value Ref Range   Sodium 135 135 - 145 mmol/L   Potassium 4.2 3.5 - 5.1 mmol/L   Chloride 106 98 - 111 mmol/L   CO2 19 (L) 22 - 32 mmol/L   Glucose, Bld 241 (H) 70 - 99 mg/dL    Comment: Glucose reference range applies only to samples taken after  fasting for at least 8 hours.   BUN 36 (H) 8 - 23 mg/dL   Creatinine, Ser 4.78 (H) 0.44 - 1.00 mg/dL   Calcium 8.8 (L) 8.9 - 10.3 mg/dL   GFR, Estimated 50 (L) >60 mL/min    Comment: (NOTE) Calculated using the CKD-EPI Creatinine Equation (2021)    Anion gap 10 5 - 15    Comment: Performed at [facility], [address] [place], [place] [address]  CBC     Status: Abnormal   Collection Time: 03/01/23  9:54 AM  Result Value Ref Range   WBC 6.9 4.0 - 10.5 K/uL   RBC 3.62 (L) 3.87 - 5.11 MIL/uL   Hemoglobin 10.6 (L) 12.0 - 15.0 g/dL   HCT 13.0 (L) 86.5 - 78.4 %   MCV 92.5 80.0 - 100.0 fL   MCH 29.3 26.0 - 34.0 pg   MCHC 31.6 30.0 - 36.0 g/dL   RDW 69.6 (H) 29.5 - 28.4 %   Platelets 164 150 - 400 K/uL   nRBC 0.0 0.0 - 0.2 %    Comment: Performed at [facility], [address] [place], [place] [address]  Troponin I (High Sensitivity)     Status: Abnormal   Collection Time: 03/01/23  9:54 AM  Result Value Ref Range   Troponin I (High Sensitivity) 19 (H) <18 ng/L    Comment: (NOTE) Elevated high sensitivity troponin I (hsTnI) values and significant  changes across serial measurements may suggest ACS but many other  chronic and acute conditions are known to elevate hsTnI results.  Refer to the "Links" section for chest pain algorithms and additional  guidance. Performed at [facility], [address], [place], [place] [address]    DG Chest Port 1 View  Result Date: 03/01/2023 CLINICAL DATA:  Chest pain EXAM: PORTABLE CHEST 1 VIEW COMPARISON:  02/22/2022 FINDINGS: Left chest wall pacer with lead in the right ventricle. Normal heart size. Slight asymmetric elevation of the right hemidiaphragm with overlying pleuroparenchymal scarring and surgical clips. No pleural effusion, interstitial edema or airspace disease. IMPRESSION: 1. No acute findings. 2. Right basilar pleuroparenchymal scarring with postsurgical changes. Electronically Signed   By: Signa Kell M.D.    On: 03/01/2023 09:53    Pending Labs [employer] (From admission, onward)     Start     Ordered   Signed and Held  Lipoprotein A (LPA)  Tomorrow morning,   R        Signed and Held   Signed and Held  Basic metabolic panel  Tomorrow morning,   R        Signed and Held   Signed and Held  Lipid panel  Tomorrow morning,   R        Signed and Held  Vitals/Pain Today's Vitals   03/01/23 1030 03/01/23 1245 03/01/23 1300 03/01/23 1330  BP: (!) 161/81 126/70 136/73 (!) 150/78  Pulse: 69 64 60 60  Resp:      Temp:      TempSrc:      SpO2: 96% 93% 95% 95%  Weight:      Height:      PainSc:        Isolation Precautions No active isolations  Medications Medications  insulin aspart (novoLOG) injection 0-15 Units (has no administration in time range)  isosorbide mononitrate (IMDUR) 24 hr tablet 30 mg (30 mg Oral Given 03/01/23 1336)  icosapent Ethyl (VASCEPA) 1 g capsule 2 g (2 g Oral Given 03/01/23 1336)    Mobility walks     Focused Assessments Cardiac Assessment Handoff:    Lab Results  Component Value Date   CKTOTAL 59 06/19/2011   CKMB 2.2 06/19/2011   TROPONINI <0.30 06/19/2011   Lab Results  Component Value Date   DDIMER 0.30 01/03/2021   Does the Patient currently have chest pain? No    R Recommendations: See Admitting Provider Note  Report given to:   Additional Notes:

## 2023-03-01 NOTE — H&P (Signed)
Cardiology Admission History and Physical   Patient ID: Emily Farmer MRN: [id number]; DOB: August 22, 1955   Admission date: 03/01/2023  PCP:  Marcine Matar, MD   [facility] Providers Cardiologist:  Chrystie Nose, MD  Electrophysiologist:  Lanier Prude, MD       Chief Complaint:  exertional chest pain  Patient Profile:   Emily Farmer is a 67 y.o. female with diabetes mellitus type 2 requiring insulin and severe mixed hyperlipidemia, permanent atrial fibrillation s/p AV node ablation and single-chamber permanent pacemaker implantation who is being seen 03/01/2023 for the evaluation of her show chest pain and dyspnea.  History of Present Illness:   Emily Farmer recently underwent AV node ablation after implantation of a single-chamber permanent pacemaker.  For the first month after AV node ablation her device was programmed at a fixed rate of 80 bpm, no rate response.  A few weeks ago she was seen back in clinic and the lower rate limit of the pacemaker was turned down to 70 bpm, but at the same time rate response was turned on.  Ever since then she is experiencing chest heaviness ("somebody sitting on my chest") and shortness of breath walking to the mailbox, only on the return trip when she has to go uphill.  She does not have chest pain or dyspnea when walking level ground.  Her symptoms promptly improve with rest.  Interrogation of her pacemaker shows that rate response is working: Her heart rate will episodically increase to 100-130 bpm with physical activity.  Otherwise pacemaker function is normal.  As expected she has 100% ventricular pacing.  There have been no episodes of high ventricular rate.  Lead parameters are excellent.  Estimated generator longevity is 14 years.   Past Medical History:  Diagnosis Date   Arthritis    Atrial fibrillation (HCC)    a. 09/2016 in setting of pancreatitis;  b. 09/2016 Echo: EF 65-60%, no rwma, Gr2 DD, mild MR, triv TR, PASP  ;  CHA2DS2VASc = 4-->Eliquis 5mg  BID. c. Recurred 06/2018 in setting of GIB.   Chronic diastolic CHF (congestive heart failure) (HCC) 01/28/2018   Diabetes mellitus    GI bleeding 06/2018   Gout    Hyperlipidemia    Hypertension    Hypertriglyceridemia    Pancreatitis    a. 09/2016 - Triglycerides 1,392 on admission.    Past Surgical History:  Procedure Laterality Date   AV NODE ABLATION N/A 01/02/2023   Procedure: AV NODE ABLATION;  Surgeon: Marinus Maw, MD;  Location: [facility] INVASIVE CV LAB;  Service: Cardiovascular;  Laterality: N/A;   BIOPSY  06/20/2018   Procedure: BIOPSY;  Surgeon: Vida Rigger, MD;  Location: [facility] ENDOSCOPY;  Service: Endoscopy;;   CHOLECYSTECTOMY N/A 01/04/2014   Procedure: LAPAROSCOPIC CHOLECYSTECTOMY WITH INTRAOPERATIVE CHOLANGIOGRAM;  Surgeon: Axel Filler, MD;  Location: [facility] OR;  Service: General;  Laterality: N/A;   COLONOSCOPY WITH PROPOFOL N/A 06/21/2018   Procedure: COLONOSCOPY WITH PROPOFOL;  Surgeon: Bernette Redbird, MD;  Location: [facility] ENDOSCOPY;  Service: Endoscopy;  Laterality: N/A;   ESOPHAGOGASTRODUODENOSCOPY (EGD) WITH PROPOFOL N/A 06/20/2018   Procedure: ESOPHAGOGASTRODUODENOSCOPY (EGD) WITH PROPOFOL;  Surgeon: Vida Rigger, MD;  Location: [facility] ENDOSCOPY;  Service: Endoscopy;  Laterality: N/A;   LUNG SURGERY     PACEMAKER IMPLANT N/A 03/03/2022   Procedure: PACEMAKER IMPLANT;  Surgeon: Marinus Maw, MD;  Location: [facility] INVASIVE CV LAB;  Service: Cardiovascular;  Laterality: N/A;     Medications Prior to Admission: Prior to Admission medications  Medication Sig Start Date End Date Taking? Authorizing Provider  Accu-Chek Softclix Lancets lancets Use to check blood sugar 3 times daily. 01/13/23   Marcine Matar, MD  acetaminophen (TYLENOL) 325 MG tablet Take 2 tablets (650 mg total) by mouth every 6 (six) hours as needed for mild pain (or Fever >/= 101). 02/01/18   Elgergawy, Leana Roe, MD  apixaban (ELIQUIS) 5 MG TABS tablet Take 1 tablet (5 mg total)  by mouth in the morning and at bedtime. 11/07/22   Marcine Matar, MD  atorvastatin (LIPITOR) 40 MG tablet Take 1 tablet (40 mg total) by mouth daily. 06/23/22   Marcine Matar, MD  Blood Glucose Monitoring Suppl (ACCU-CHEK GUIDE) w/Device KIT Use to check blood sugar 3 times daily. 01/13/23   Marcine Matar, MD  Blood Pressure Monitoring (BLOOD PRESSURE MONITOR/ARM) DEVI Check blood pressure once per day at least 2 hours after medications. 07/22/21   Alver Sorrow, NP  ciclopirox (PENLAC) 8 % solution Apply topically at bedtime. Apply over nail and surrounding skin. Apply daily over previous coat. After seven (7) days, may remove with alcohol and continue cycle. 08/29/22   Vivi Barrack, DPM  Continuous Blood Gluc Sensor (DEXCOM G7 SENSOR) MISC 1 Device by Does not apply route as directed. 08/22/22   Shamleffer, Konrad Dolores, MD  diclofenac Sodium (VOLTAREN) 1 % GEL Apply 2 g topically 4 (four) times daily. Apply across lower back for back pain Patient taking differently: Apply 2 g topically 4 (four) times daily as needed (pain). Apply across lower back for back pain 11/18/22   Marcine Matar, MD  diltiazem (CARDIZEM CD) 120 MG 24 hr capsule Take 1 capsule (120 mg total) by mouth daily. 06/03/22   Marinus Maw, MD  empagliflozin (JARDIANCE) 25 MG TABS tablet Take 1 tablet (25 mg total) by mouth daily before breakfast. 01/26/23   Shamleffer, Konrad Dolores, MD  fenofibrate (TRICOR) 48 MG tablet Take 1 tablet (48 mg total) by mouth daily. 11/19/22   Marcine Matar, MD  ferrous sulfate 325 (65 FE) MG tablet Take 1 tablet (325 mg total) by mouth daily with breakfast. 04/11/21 12/30/22  Anders Simmonds, PA-C  glucose blood (ACCU-CHEK GUIDE) test strip Use to check blood sugar 3 times daily. 01/13/23   Marcine Matar, MD  insulin glargine (LANTUS SOLOSTAR) 100 UNIT/ML Solostar Pen Inject 55 Units into the skin daily. 11/18/22   Marcine Matar, MD  insulin lispro (HUMALOG KWIKPEN)  100 UNIT/ML KwikPen Max daily 45 units Patient taking differently: Inject 12 Units into the skin with breakfast, with lunch, and with evening meal. 08/22/22   Shamleffer, Konrad Dolores, MD  Insulin Pen Needle 32G X 4 MM MISC use in the morning, at noon, in the evening, and at bedtime. 08/22/22   Shamleffer, Konrad Dolores, MD  Insulin Syringe-Needle U-100 (TRUEPLUS INSULIN SYRINGE) 31G X 5/16" 0.3 ML MISC Use to inject insulin daily. Patient taking differently: 1 each by Other route See admin instructions. Use to inject insulin daily. 05/06/18   Fulp, Cammie, MD  liraglutide (VICTOZA) 18 MG/3ML SOPN Inject 1.8 mg into the skin daily. 08/22/22   Shamleffer, Konrad Dolores, MD  metoprolol succinate (TOPROL-XL) 100 MG 24 hr tablet Take 1 tablet (100 mg total) by mouth daily. Take with or immediately following a meal. 03/04/22   Sherie Don, NP  Multiple Vitamins-Minerals (CENTRUM SILVER PO) Take 1 tablet by mouth daily.    [provider]  Multiple Vitamins-Minerals (OCUVITE  ADULT 50+) CAPS Take 1 capsule by mouth daily.    [provider]  pioglitazone (ACTOS) 15 MG tablet Take 1 tablet (15 mg total) by mouth daily. 08/22/22   Shamleffer, Konrad Dolores, MD     Allergies:    Allergies  Allergen Reactions   Trulicity [Dulaglutide] Diarrhea    Social History:   Social History   Socioeconomic History   Marital status: Single    Spouse name: Not on file   Number of children: Not on file   Years of education: Not on file   Highest education level: Not on file  Occupational History   Not on file  Tobacco Use   Smoking status: Never   Smokeless tobacco: Never  Vaping Use   Vaping status: Never Used  Substance and Sexual Activity   Alcohol use: No   Drug use: No   Sexual activity: Not Currently  Other Topics Concern   Not on file  Social History Narrative   Lives with her sister and does not need any assistance with ADLs.     Social Determinants of Health    Financial Resource Strain: Medium Risk (09/15/2022)   Overall Financial Resource Strain (CARDIA)    Difficulty of Paying Living Expenses: Somewhat hard  Food Insecurity: Food Insecurity Present (09/15/2022)   Hunger Vital Sign    Worried About Running Out of Food in the Last Year: Sometimes true    Ran Out of Food in the Last Year: Often true  Transportation Needs: No Transportation Needs (09/15/2022)   PRAPARE - Administrator, Civil Service (Medical): No    Lack of Transportation (Non-Medical): No  Physical Activity: Insufficiently Active (09/15/2022)   Exercise Vital Sign    Days of Exercise per Week: 4 days    Minutes of Exercise per Session: 10 min  Stress: Stress Concern Present (07/21/2022)   [employer] of Occupational Health - Occupational Stress Questionnaire    Feeling of Stress : Very much  Social Connections: Socially Isolated (09/15/2022)   Social Connection and Isolation Panel [NHANES]    Frequency of Communication with Friends and Family: Once a week    Frequency of Social Gatherings with Friends and Family: More than three times a week    Attends Religious Services: Never    Database administrator or Organizations: No    Attends Banker Meetings: Never    Marital Status: Never married  Intimate Partner Violence: Not At Risk (09/15/2022)   Humiliation, Afraid, Rape, and Kick questionnaire    Fear of Current or Ex-Partner: No    Emotionally Abused: No    Physically Abused: No    Sexually Abused: No    Family History:   The patient's family history includes Diabetes in her sister; Heart attack in her father and mother. There is no history of High blood pressure or High Cholesterol.    ROS:  Please see the history of present illness.  All other ROS reviewed and negative.     Physical Exam/Data:   Vitals:   03/01/23 0930 03/01/23 0945 03/01/23 1000 03/01/23 1030  BP: (!) 149/72 (!) 150/99 (!) 154/78 (!) 161/81  Pulse: 77 73 73 69  Resp:       Temp:      TempSrc:      SpO2: 96% 98% 96% 96%  Weight:      Height:       No intake or output data in the 24 hours ending 03/01/23 1210  03/01/2023    9:24 AM 02/02/2023    8:40 AM 01/02/2023   10:17 AM  Last 3 Weights  Weight (lbs) 174 lb 171 lb 6.4 oz 151 lb  Weight (kg) 78.926 kg 77.747 kg 68.493 kg     Body mass index is 30.82 kg/m.  General:  Well nourished, well developed, in no acute distress obese.  Healthy left subclavian pacemaker site. HEENT: normal Neck: no JVD Vascular: No carotid bruits; Distal pulses 2+ bilaterally   Cardiac:  normal S1, S2; RRR; no murmur  Lungs:  clear to auscultation bilaterally, no wheezing, rhonchi or rales  Abd: soft, nontender, no hepatomegaly  Ext: no edema Musculoskeletal:  No deformities, BUE and BLE strength normal and equal Skin: warm and dry  Neuro:  CNs 2-12 intact, no focal abnormalities noted Psych:  Normal affect    EKG:  The ECG that was done today was personally reviewed and demonstrates background atrial fibrillation, 100% ventricular paced rhythm with a remarkably narrow QRS complex (115 ms) and subtle T wave inversion in the lateral leads.  Relevant CV Studies: Echocardiogram 03/03/2022    1. Left ventricular ejection fraction, by estimation, is 55 to 60%. The  left ventricle has normal function. The left ventricle has no regional  wall motion abnormalities. There is mild left ventricular hypertrophy of  the Posterior segment. Left  ventricular diastolic function could not be evaluated.   2. Right ventricular systolic function is normal. The right ventricular  size is normal. A Prominent RV moderator band is visualized. Tricuspid  regurgitation signal is inadequate for assessing PA pressure.   3. Right atrial size was moderately dilated.   4. The mitral valve is normal in structure. Trivial mitral valve  regurgitation. No evidence of mitral stenosis.   5. The aortic valve is tricuspid. There is moderate  calcification of the  aortic valve. There is moderate thickening of the aortic valve. Aortic  valve regurgitation is not visualized. Aortic valve  sclerosis/calcification is present, without any evidence  of aortic stenosis.   6. The inferior vena cava is normal in size with <50% respiratory  variability, suggesting right atrial pressure of 8 mmHg.    Laboratory Data:  High Sensitivity Troponin:   Recent Labs  Lab 03/01/23 0954  TROPONINIHS 19*      Chemistry Recent Labs  Lab 03/01/23 0954  NA 135  K 4.2  CL 106  CO2 19*  GLUCOSE 241*  BUN 36*  CREATININE 1.21*  CALCIUM 8.8*  GFRNONAA 50*  ANIONGAP 10    No results for input(s): "PROT", "ALBUMIN", "AST", "ALT", "ALKPHOS", "BILITOT" in the last 168 hours. Lipids No results for input(s): "CHOL", "TRIG", "HDL", "LABVLDL", "LDLCALC", "CHOLHDL" in the last 168 hours. Hematology Recent Labs  Lab 03/01/23 0954  WBC 6.9  RBC 3.62*  HGB 10.6*  HCT 33.5*  MCV 92.5  MCH 29.3  MCHC 31.6  RDW 15.6*  PLT 164   Thyroid No results for input(s): "TSH", "FREET4" in the last 168 hours. BNPNo results for input(s): "BNP", "PROBNP" in the last 168 hours.  DDimer No results for input(s): "DDIMER" in the last 168 hours.   Radiology/Studies:  DG Chest Port 1 View  Result Date: 03/01/2023 CLINICAL DATA:  Chest pain EXAM: PORTABLE CHEST 1 VIEW COMPARISON:  02/22/2022 FINDINGS: Left chest wall pacer with lead in the right ventricle. Normal heart size. Slight asymmetric elevation of the right hemidiaphragm with overlying pleuroparenchymal scarring and surgical clips. No pleural effusion, interstitial edema or airspace disease.  IMPRESSION: 1. No acute findings. 2. Right basilar pleuroparenchymal scarring with postsurgical changes. Electronically Signed   By: Signa Kell M.D.   On: 03/01/2023 09:53     Assessment and Plan:   CAD/exertional angina: She describes classic exertional angina which was "cured" when she had a fixed rate of  80 bpm, but she has now resurfaced with activity due to rate response.  Will temporarily decrease the upper tracking rate to 100 bpm.  Also decrease the lower rate limit temporarily to 60 bpm to allow a slower heart rate for planned coronary CT angiogram.  She does not have any symptoms to suggest an acute coronary syndrome and I think it is reasonable to first assess the extent and severity of CAD with a coronary CTA.  If she has multivessel CAD that should be confirmed with coronary angiography followed by CABG (insulin requiring diabetes).  If she has single-vessel disease involving a very important conduit such as the proximal LAD artery she is a good candidate for angioplasty-drug-eluting stent.  If on the other hand her symptoms are related to stenosis and a relatively small conduit or a secondary branch it is very reasonable to treat medically.  Will start isosorbide mononitrate.  Beta-blockers are of reduced benefit since her heart rate is determined by the pacemaker but we will continue her current prescription for metoprolol succinate. HLP: Despite fair glycemic control (hemoglobin A1c most recently 7.8%) she has a very disadvantageous lipid profile with severe hypertriglyceridemia (triglycerides 1530 and previous history of acute pancreatitis), very low HDL cholesterol in the 30s.  Unable to calculate LDL but when direct LDL was measured in the past it was 137.  At high risk for vascular complications and recurrent pancreatitis.  She is already on statin and fenofibrate.  She is a great candidate for Vascepa if we can get coverage for this.  Also consider enrollment in clinical trial of olezarsen. DM2: On dapagliflozin and liraglutide, but still requires insulin and pioglitazone for glucose control.  No evidence of heart failure.  Normal LVEF by most recent echo. AFib: Permanent arrhythmia, on appropriate anticoagulation, very difficult rate control so she underwent AV node ablation. CHB: s/p AV node  ablation. PPM: Normal device function.  Excellent placement of ventricular lead with very narrow QRS almost mimicking native AV conduction.  ECG shows subtle repolarization changes (suspected might be useful as a tool in the future since the pattern of electrical activation is so close to native conduction).  Temporarily change the lower rate limit and the rate response sensor to allow coronary CTA and for angina prevention.  Risk Assessment/Risk Scores:         CHA2DS2-VASc Score = 5   This indicates a 7.2% annual risk of stroke. The patient's score is based upon: CHF History: 0 HTN History: 1 Diabetes History: 1 Stroke History: 0 Vascular Disease History: 1 Age Score: 1 Gender Score: 1     Code Status: Full Code  Severity of Illness: The appropriate patient status for this patient is OBSERVATION. Observation status is judged to be reasonable and necessary in order to provide the required intensity of service to ensure the patient's safety. The patient's presenting symptoms, physical exam findings, and initial radiographic and laboratory data in the context of their medical condition is felt to place them at decreased risk for further clinical deterioration. Furthermore, it is anticipated that the patient will be medically stable for discharge from the hospital within 2 midnights of admission.    For questions  or updates, please contact [facility] Please consult [email or web address] for contact info under     Signed, Thurmon Fair, MD  03/01/2023 12:10 PM

## 2023-03-01 NOTE — ED Notes (Signed)
Pt is eating lunch

## 2023-03-01 NOTE — ED Triage Notes (Signed)
Pt BIB [facility] for CP.  Pt was walking back form the mailbox and develoved 8/10 chest pressure and SOB and became pale. EMS gave 1 nitroglycerin which helped with pain.  Currently 2/10.   Pt takes Eliquis.  EMS did not give ASA b/c pt stated that doctor had told her not to take it.  PT was sinus on monitor for EMS.   156/84 96% RA, 78-80 HR,

## 2023-03-02 ENCOUNTER — Observation Stay (HOSPITAL_BASED_OUTPATIENT_CLINIC_OR_DEPARTMENT_OTHER): Payer: Medicare HMO

## 2023-03-02 DIAGNOSIS — N1831 Chronic kidney disease, stage 3a: Secondary | ICD-10-CM | POA: Diagnosis not present

## 2023-03-02 DIAGNOSIS — I200 Unstable angina: Secondary | ICD-10-CM | POA: Diagnosis not present

## 2023-03-02 DIAGNOSIS — R7989 Other specified abnormal findings of blood chemistry: Secondary | ICD-10-CM | POA: Diagnosis not present

## 2023-03-02 DIAGNOSIS — R079 Chest pain, unspecified: Secondary | ICD-10-CM

## 2023-03-02 LAB — ECHOCARDIOGRAM COMPLETE
AR max vel: 2.06 cm2
AV Area VTI: 2.02 cm2
AV Area mean vel: 1.99 cm2
AV Mean grad: 5 mmHg
AV Peak grad: 8.8 mmHg
Ao pk vel: 1.48 m/s
Area-P 1/2: 3.61 cm2
Height: 63 in
S' Lateral: 2.6 cm
Weight: 2892.44 oz

## 2023-03-02 LAB — BASIC METABOLIC PANEL
Anion gap: 7 (ref 5–15)
BUN: 43 mg/dL — ABNORMAL HIGH (ref 8–23)
CO2: 22 mmol/L (ref 22–32)
Calcium: 8.7 mg/dL — ABNORMAL LOW (ref 8.9–10.3)
Chloride: 106 mmol/L (ref 98–111)
Creatinine, Ser: 1.47 mg/dL — ABNORMAL HIGH (ref 0.44–1.00)
GFR, Estimated: 39 mL/min — ABNORMAL LOW (ref >60)
Glucose, Bld: 286 mg/dL — ABNORMAL HIGH (ref 70–99)
Potassium: 4.6 mmol/L (ref 3.5–5.1)
Sodium: 135 mmol/L (ref 135–145)

## 2023-03-02 LAB — GLUCOSE, CAPILLARY
Glucose-Capillary: 271 mg/dL — ABNORMAL HIGH (ref 70–99)
Glucose-Capillary: 180 mg/dL — ABNORMAL HIGH (ref 70–99)
Glucose-Capillary: 72 mg/dL (ref 70–99)
Glucose-Capillary: 115 mg/dL — ABNORMAL HIGH (ref 70–99)

## 2023-03-02 LAB — LIPID PANEL
Cholesterol: 95 mg/dL (ref 0–200)
HDL: 39 mg/dL — ABNORMAL LOW (ref >40)
LDL Cholesterol: 32 mg/dL (ref 0–99)
Total CHOL/HDL Ratio: 2.4 ratio
Triglycerides: 121 mg/dL (ref <150)
VLDL: 24 mg/dL (ref 0–40)

## 2023-03-02 MED ORDER — SODIUM CHLORIDE 0.9 % WEIGHT BASED INFUSION
1.0000 mL/kg/h | INTRAVENOUS | Status: DC
  Administered 2023-03-03 (×2): 1 mL/kg/h via INTRAVENOUS

## 2023-03-02 MED ORDER — SODIUM CHLORIDE 0.9 % WEIGHT BASED INFUSION
3.0000 mL/kg/h | INTRAVENOUS | Status: DC
  Administered 2023-03-03: 3 mL/kg/h via INTRAVENOUS

## 2023-03-02 MED ORDER — INSULIN ASPART 100 UNIT/ML IJ SOLN
10.0000 [IU] | Freq: Three times a day (TID) | INTRAMUSCULAR | Status: DC
  Administered 2023-03-02 – 2023-03-05 (×7): 10 [IU] via SUBCUTANEOUS

## 2023-03-02 MED ORDER — INFLUENZA VAC A&B SURF ANT ADJ 0.5 ML IM SUSY
0.5000 mL | PREFILLED_SYRINGE | INTRAMUSCULAR | Status: DC
  Filled 2023-03-02: qty 0.5

## 2023-03-02 MED ORDER — INSULIN GLARGINE-YFGN 100 UNIT/ML ~~LOC~~ SOLN
30.0000 [IU] | Freq: Every day | SUBCUTANEOUS | Status: DC
  Administered 2023-03-02: 30 [IU] via SUBCUTANEOUS
  Filled 2023-03-02 (×2): qty 0.3

## 2023-03-02 MED ORDER — ASPIRIN 81 MG PO CHEW
81.0000 mg | CHEWABLE_TABLET | ORAL | Status: AC
  Administered 2023-03-03: 81 mg via ORAL
  Filled 2023-03-02: qty 1

## 2023-03-02 MED ORDER — INSULIN GLARGINE-YFGN 100 UNIT/ML ~~LOC~~ SOLN
55.0000 [IU] | Freq: Every day | SUBCUTANEOUS | Status: DC
  Filled 2023-03-02: qty 0.55

## 2023-03-02 NOTE — TOC CM/SW Note (Signed)
Transition of Care [facility]) - Inpatient Brief Assessment   Patient Details  Name: Emily Farmer MRN: [id number] Date of Birth: Nov 01, 1955  [facility] CM/SW Contact:    Gala Lewandowsky, RN Phone Number: 03/02/2023, 3:49 PM   Clinical Narrative: Patient presented for chest pain-plan for Left Heart Cath 03-03-23. PTA patient was independent from home with sister. Case Manager will continue to follow for transition of care needs as the patient progresses.   Transition of Care Asessment: Insurance and Status: Insurance coverage has been reviewed Patient has primary care physician: Yes Home environment has been reviewed: reviewed Prior level of function:: independent Prior/Current Home Services: No current home services Social Determinants of Health Reivew: SDOH reviewed no interventions necessary Readmission risk has been reviewed: Yes Transition of care needs: no transition of care needs at this time

## 2023-03-02 NOTE — Progress Notes (Signed)
Patient Name: Emily Farmer Date of Encounter: 03/02/2023 [facility] Cardiologist: Chrystie Nose, MD   Interval Summary  .    67 yr old female with PMH of insulin dependent type 2 DM, HLD, permanent A fib s/p AV nde ablation 01/02/23, single chamber PPM in situ, who is currently admitted under cardiology for angina.   Patient states she had few days onset of chest pain while walking at home, resting relieves the pain. She states she had similar once while walking to the bathroom here. She is currently pain free in bed. She wants to know when can she eat.   Vital Signs .    Vitals:   03/01/23 2343 03/02/23 0354 03/02/23 0737 03/02/23 0738  BP: (!) 103/59 (!) 98/56 122/69   Pulse: 60 60  62  Resp: 16 16  16   Temp: 97.7 F (36.5 C) 97.6 F (36.4 C)  98.1 F (36.7 C)  TempSrc: Oral Oral  Oral  SpO2: 93% 94% 96%   Weight:      Height:        Intake/Output Summary (Last 24 hours) at 03/02/2023 0951 Last data filed at 03/02/2023 0500 Gross per 24 hour  Intake 580.37 ml  Output --  Net 580.37 ml      03/01/2023    3:01 PM 03/01/2023    9:24 AM 02/02/2023    8:40 AM  Last 3 Weights  Weight (lbs) 180 lb 12.4 oz 174 lb 171 lb 6.4 oz  Weight (kg) 82 kg 78.926 kg 77.747 kg      Telemetry/ECG    V paced rhythm  - Personally Reviewed  Physical Exam .   GEN: No acute distress.   Neck: No JVD Cardiac: RRR, soft systolic murmur noted  Respiratory: Clear to auscultation bilaterally. On room air  GI: Soft, nontender, non-distended  MS: No leg edema  Assessment & Plan .     Chest pain  - presented with chest pain, SOB while walking, improved with resting  - Hs trop 19 >29 - EKG v paced rhtyhm  - LDL 32 - CT coronary study planned for ischemic evaluation, due to rising Cr today, will hold off; will provide gentle IVF hydration today, repeat BMP tomorrow, if renal index improving, may proceed with CT  - will update Echo, last LVEF 55-60% on Echo 02/2022  - continue  medical therapy for presumed CAD with ASA 81mg  daily, metoprolol XL 100mg ,  lipitor 40mg , fenofibrate 160mg , vascepa 2g, and Imdur 30mg    CKD IIIa  - Cr baseline 1-1.1, 1.21 POA, now 1.47, slightly worsened  - gentle IVF hydration, better control of hyperglycemia, hold jardiance, repeat BMP in AM  - avoid nephrotoxic agents   Type 2 DM with hyperglycemia  - A1C 10.2% from 08/22/22 , will repeat A1C today - she takes actos, jardiance, victoza, Lantus 55 unit at bedtime, humalog 45 TID AC at home  - FBG is 271 today, she is currently on novolog insulin SSI AC HS only, will resume Lantus 55 unit at bedtime, start novolog 10unit TID AC + SSI AC given less PO intake in the hospital, hold PTA actos, jardiance, victoza, monitor glycemic trend, goal 100-180.  - resume [facility] diet  Permanent A fib Hx of tachy-brady syndrome  - s/p single chamber medtronic PPM, device interrogation at ED by Dr Royann Shivers showed normal function, 100% ventricular pacing, no episode of high ventricular rate, Lead parameters are excellent. Estimated generator longevity is 14 years.  - continue  PTA Eliquis 5mg  BID and diltiazem 120mg  daily and metoprolol XL 100mg  daily   Normocytic anemia  - Hgb 10.6 on CBC, was 12.8 on 12/08/22 - will send reti, iron panel, B12/ folate  - need outpatient PCP follow up on this   DVT prophylaxis - on therapeutic anticoagulation with Eliquis   AD: Full code  Dispo: pending clinical course        For questions or updates, please contact [facility] Please consult [email or web address] for contact info under        Signed, Cyndi Bender, NP

## 2023-03-02 NOTE — Care Management Obs Status (Signed)
MEDICARE OBSERVATION STATUS NOTIFICATION   Patient Details  Name: Emily Farmer MRN: [id number] Date of Birth: 13-May-1956   Medicare Observation Status Notification Given:  Yes    Gala Lewandowsky, RN 03/02/2023, 3:46 PM

## 2023-03-03 ENCOUNTER — Observation Stay (HOSPITAL_COMMUNITY): Payer: Medicare HMO

## 2023-03-03 ENCOUNTER — Inpatient Hospital Stay (HOSPITAL_COMMUNITY)
Admission: EM | Disposition: A | Discharge status: Skilled Nursing Facility | Payer: Self-pay | Source: Home / Self Care | Attending: Surgery

## 2023-03-03 DIAGNOSIS — I270 Primary pulmonary hypertension: Secondary | ICD-10-CM | POA: Diagnosis not present

## 2023-03-03 DIAGNOSIS — I5033 Acute on chronic diastolic (congestive) heart failure: Secondary | ICD-10-CM

## 2023-03-03 DIAGNOSIS — N1831 Chronic kidney disease, stage 3a: Secondary | ICD-10-CM | POA: Diagnosis not present

## 2023-03-03 DIAGNOSIS — R7989 Other specified abnormal findings of blood chemistry: Secondary | ICD-10-CM | POA: Diagnosis not present

## 2023-03-03 DIAGNOSIS — N179 Acute kidney failure, unspecified: Secondary | ICD-10-CM | POA: Diagnosis not present

## 2023-03-03 DIAGNOSIS — I200 Unstable angina: Secondary | ICD-10-CM | POA: Diagnosis not present

## 2023-03-03 HISTORY — PX: RIGHT/LEFT HEART CATH AND CORONARY ANGIOGRAPHY: CATH118266

## 2023-03-03 LAB — POCT I-STAT EG7
Acid-base deficit: 6 mmol/L — ABNORMAL HIGH (ref 0.0–2.0)
Acid-base deficit: 6 mmol/L — ABNORMAL HIGH (ref 0.0–2.0)
Bicarbonate: 19.3 mmol/L — ABNORMAL LOW (ref 20.0–28.0)
Bicarbonate: 19.4 mmol/L — ABNORMAL LOW (ref 20.0–28.0)
Calcium, Ion: 1.2 mmol/L (ref 1.15–1.40)
Calcium, Ion: 1.24 mmol/L (ref 1.15–1.40)
HCT: 31 % — ABNORMAL LOW (ref 36.0–46.0)
HCT: 32 % — ABNORMAL LOW (ref 36.0–46.0)
Hemoglobin: 10.5 g/dL — ABNORMAL LOW (ref 12.0–15.0)
Hemoglobin: 10.9 g/dL — ABNORMAL LOW (ref 12.0–15.0)
O2 Saturation: 49 %
O2 Saturation: 49 %
Potassium: 3.9 mmol/L (ref 3.5–5.1)
Potassium: 3.9 mmol/L (ref 3.5–5.1)
Sodium: 141 mmol/L (ref 135–145)
Sodium: 141 mmol/L (ref 135–145)
TCO2: 20 mmol/L — ABNORMAL LOW (ref 22–32)
TCO2: 20 mmol/L — ABNORMAL LOW (ref 22–32)
pCO2, Ven: 37.3 mmHg — ABNORMAL LOW (ref 44–60)
pCO2, Ven: 37.4 mmHg — ABNORMAL LOW (ref 44–60)
pH, Ven: 7.322 (ref 7.25–7.43)
pH, Ven: 7.322 (ref 7.25–7.43)
pO2, Ven: 28 mmHg — CL (ref 32–45)
pO2, Ven: 28 mmHg — CL (ref 32–45)

## 2023-03-03 LAB — CBC WITH DIFFERENTIAL/PLATELET
Abs Immature Granulocytes: 0.02 10*3/uL (ref 0.00–0.07)
Basophils Absolute: 0 10*3/uL (ref 0.0–0.1)
Basophils Relative: 0 %
Eosinophils Absolute: 0.3 10*3/uL (ref 0.0–0.5)
Eosinophils Relative: 5 %
HCT: 30.6 % — ABNORMAL LOW (ref 36.0–46.0)
Hemoglobin: 9.5 g/dL — ABNORMAL LOW (ref 12.0–15.0)
Immature Granulocytes: 0 %
Lymphocytes Relative: 21 %
Lymphs Abs: 1.3 10*3/uL (ref 0.7–4.0)
MCH: 28 pg (ref 26.0–34.0)
MCHC: 31 g/dL (ref 30.0–36.0)
MCV: 90.3 fL (ref 80.0–100.0)
Monocytes Absolute: 0.8 10*3/uL (ref 0.1–1.0)
Monocytes Relative: 13 %
Neutro Abs: 3.7 10*3/uL (ref 1.7–7.7)
Neutrophils Relative %: 61 %
Platelets: 165 10*3/uL (ref 150–400)
RBC: 3.39 MIL/uL — ABNORMAL LOW (ref 3.87–5.11)
RDW: 15.7 % — ABNORMAL HIGH (ref 11.5–15.5)
WBC: 6.1 10*3/uL (ref 4.0–10.5)
nRBC: 0 % (ref 0.0–0.2)

## 2023-03-03 LAB — URINALYSIS, ROUTINE W REFLEX MICROSCOPIC
Bilirubin Urine: NEGATIVE
Glucose, UA: >=500 mg/dL — AB
Hgb urine dipstick: NEGATIVE
Ketones, ur: NEGATIVE mg/dL
Leukocytes,Ua: NEGATIVE
Nitrite: POSITIVE — AB
Protein, ur: NEGATIVE mg/dL
Specific Gravity, Urine: 1.015 (ref 1.005–1.030)
pH: 5 (ref 5.0–8.0)

## 2023-03-03 LAB — BASIC METABOLIC PANEL
Anion gap: 10 (ref 5–15)
BUN: 47 mg/dL — ABNORMAL HIGH (ref 8–23)
CO2: 19 mmol/L — ABNORMAL LOW (ref 22–32)
Calcium: 8.6 mg/dL — ABNORMAL LOW (ref 8.9–10.3)
Chloride: 108 mmol/L (ref 98–111)
Creatinine, Ser: 1.49 mg/dL — ABNORMAL HIGH (ref 0.44–1.00)
GFR, Estimated: 39 mL/min — ABNORMAL LOW (ref >60)
Glucose, Bld: 150 mg/dL — ABNORMAL HIGH (ref 70–99)
Potassium: 4.3 mmol/L (ref 3.5–5.1)
Sodium: 137 mmol/L (ref 135–145)

## 2023-03-03 LAB — URINALYSIS, W/ REFLEX TO CULTURE (INFECTION SUSPECTED)
Bilirubin Urine: NEGATIVE
Glucose, UA: >=500 mg/dL — AB
Hgb urine dipstick: NEGATIVE
Ketones, ur: NEGATIVE mg/dL
Leukocytes,Ua: NEGATIVE
Nitrite: POSITIVE — AB
Protein, ur: NEGATIVE mg/dL
Specific Gravity, Urine: 1.01 (ref 1.005–1.030)
pH: 5 (ref 5.0–8.0)

## 2023-03-03 LAB — POCT I-STAT 7, (LYTES, BLD GAS, ICA,H+H)
Acid-base deficit: 8 mmol/L — ABNORMAL HIGH (ref 0.0–2.0)
Bicarbonate: 16.9 mmol/L — ABNORMAL LOW (ref 20.0–28.0)
Calcium, Ion: 1.17 mmol/L (ref 1.15–1.40)
HCT: 31 % — ABNORMAL LOW (ref 36.0–46.0)
Hemoglobin: 10.5 g/dL — ABNORMAL LOW (ref 12.0–15.0)
O2 Saturation: 85 %
Potassium: 3.8 mmol/L (ref 3.5–5.1)
Sodium: 141 mmol/L (ref 135–145)
TCO2: 18 mmol/L — ABNORMAL LOW (ref 22–32)
pCO2 arterial: 31.1 mmHg — ABNORMAL LOW (ref 32–48)
pH, Arterial: 7.343 — ABNORMAL LOW (ref 7.35–7.45)
pO2, Arterial: 51 mmHg — ABNORMAL LOW (ref 83–108)

## 2023-03-03 LAB — GLUCOSE, CAPILLARY
Glucose-Capillary: 120 mg/dL — ABNORMAL HIGH (ref 70–99)
Glucose-Capillary: 95 mg/dL (ref 70–99)
Glucose-Capillary: 85 mg/dL (ref 70–99)
Glucose-Capillary: 167 mg/dL — ABNORMAL HIGH (ref 70–99)

## 2023-03-03 SURGERY — RIGHT/LEFT HEART CATH AND CORONARY ANGIOGRAPHY
Anesthesia: LOCAL

## 2023-03-03 MED ORDER — HEPARIN SODIUM (PORCINE) 1000 UNIT/ML IJ SOLN
INTRAMUSCULAR | Status: AC
  Filled 2023-03-03: qty 10

## 2023-03-03 MED ORDER — LIDOCAINE HCL (PF) 1 % IJ SOLN
INTRAMUSCULAR | Status: DC | PRN
  Administered 2023-03-03: 5 mL
  Administered 2023-03-03: 2 mL
  Administered 2023-03-03: 10 mL

## 2023-03-03 MED ORDER — HEPARIN (PORCINE) 25000 UT/250ML-% IV SOLN
1150.0000 [IU]/h | INTRAVENOUS | Status: DC
  Administered 2023-03-03: 1000 [IU]/h via INTRAVENOUS
  Filled 2023-03-03: qty 250

## 2023-03-03 MED ORDER — FENTANYL CITRATE (PF) 100 MCG/2ML IJ SOLN
INTRAMUSCULAR | Status: DC | PRN
  Administered 2023-03-03: 25 ug via INTRAVENOUS

## 2023-03-03 MED ORDER — LIDOCAINE HCL (PF) 1 % IJ SOLN
INTRAMUSCULAR | Status: AC
  Filled 2023-03-03: qty 30

## 2023-03-03 MED ORDER — IOHEXOL 350 MG/ML SOLN
INTRAVENOUS | Status: DC | PRN
  Administered 2023-03-03: 35 mL via INTRA_ARTERIAL

## 2023-03-03 MED ORDER — FUROSEMIDE 10 MG/ML IJ SOLN
INTRAMUSCULAR | Status: AC
  Filled 2023-03-03: qty 8

## 2023-03-03 MED ORDER — VERAPAMIL HCL 2.5 MG/ML IV SOLN
INTRAVENOUS | Status: DC | PRN
  Administered 2023-03-03: 10 mL via INTRA_ARTERIAL

## 2023-03-03 MED ORDER — VERAPAMIL HCL 2.5 MG/ML IV SOLN
INTRAVENOUS | Status: AC
  Filled 2023-03-03: qty 2

## 2023-03-03 MED ORDER — FENTANYL CITRATE (PF) 100 MCG/2ML IJ SOLN
INTRAMUSCULAR | Status: AC
  Filled 2023-03-03: qty 2

## 2023-03-03 MED ORDER — FUROSEMIDE 10 MG/ML IJ SOLN: 40.0000 mg | Freq: Once | INTRAMUSCULAR | Status: DC

## 2023-03-03 MED ORDER — ISOSORBIDE MONONITRATE ER 60 MG PO TB24
60.0000 mg | ORAL_TABLET | Freq: Every day | ORAL | Status: DC
  Administered 2023-03-04 – 2023-03-08 (×5): 60 mg via ORAL
  Filled 2023-03-03 (×5): qty 1

## 2023-03-03 MED ORDER — SODIUM CHLORIDE 0.9% FLUSH
3.0000 mL | INTRAVENOUS | Status: DC | PRN
  Administered 2023-03-04: 3 mL via INTRAVENOUS

## 2023-03-03 MED ORDER — INSULIN GLARGINE-YFGN 100 UNIT/ML ~~LOC~~ SOLN
20.0000 [IU] | Freq: Every day | SUBCUTANEOUS | Status: DC
  Administered 2023-03-03 – 2023-03-07 (×5): 20 [IU] via SUBCUTANEOUS
  Filled 2023-03-03 (×7): qty 0.2

## 2023-03-03 MED ORDER — SODIUM CHLORIDE 0.9 % IV SOLN: INTRAVENOUS | Status: AC

## 2023-03-03 MED ORDER — LABETALOL HCL 5 MG/ML IV SOLN: 10.0000 mg | INTRAVENOUS | Status: AC | PRN

## 2023-03-03 MED ORDER — SODIUM CHLORIDE 0.9% FLUSH
3.0000 mL | Freq: Two times a day (BID) | INTRAVENOUS | Status: DC
  Administered 2023-03-04 – 2023-03-08 (×5): 3 mL via INTRAVENOUS

## 2023-03-03 MED ORDER — SODIUM CHLORIDE 0.9 % IV SOLN: 250.0000 mL | INTRAVENOUS | Status: DC | PRN

## 2023-03-03 MED ORDER — FUROSEMIDE 10 MG/ML IJ SOLN
80.0000 mg | Freq: Two times a day (BID) | INTRAMUSCULAR | Status: DC
  Administered 2023-03-03 – 2023-03-05 (×4): 80 mg via INTRAVENOUS
  Filled 2023-03-03 (×3): qty 8

## 2023-03-03 MED ORDER — HYDRALAZINE HCL 20 MG/ML IJ SOLN: 10.0000 mg | INTRAMUSCULAR | Status: AC | PRN

## 2023-03-03 MED ORDER — HEPARIN (PORCINE) IN NACL 1000-0.9 UT/500ML-% IV SOLN
INTRAVENOUS | Status: DC | PRN
  Administered 2023-03-03 (×2): 500 mL

## 2023-03-03 SURGICAL SUPPLY — 16 items
CATH BALLN WEDGE 5F 110CM (CATHETERS) IMPLANT
CATH INFINITI AMBI 5FR TG (CATHETERS) IMPLANT
CATH INFINITI MULTIPACK ANG 4F (CATHETERS) IMPLANT
DEVICE RAD COMP TR BAND LRG (VASCULAR PRODUCTS) IMPLANT
GLIDESHEATH SLEND SS 6F .021 (SHEATH) IMPLANT
GUIDEWIRE INQWIRE 1.5J.035X260 (WIRE) IMPLANT
INQWIRE 1.5J .035X260CM (WIRE) ×1
KIT MICROPUNCTURE NIT STIFF (SHEATH) IMPLANT
KIT SINGLE USE MANIFOLD (KITS) IMPLANT
PACK CARDIAC CATHETERIZATION (CUSTOM PROCEDURE TRAY) ×1 IMPLANT
SET ATX-X65L (MISCELLANEOUS) IMPLANT
SHEATH GLIDE SLENDER 4/5FR (SHEATH) IMPLANT
SHEATH PINNACLE 5F 10CM (SHEATH) IMPLANT
SHEATH PROBE COVER 6X72 (BAG) IMPLANT
WIRE EMERALD 3MM-J .035X150CM (WIRE) IMPLANT
WIRE HI TORQ VERSACORE-J 145CM (WIRE) IMPLANT

## 2023-03-03 NOTE — H&P (View-Only) (Signed)
Patient Name: Emily Farmer Date of Encounter: 03/03/2023 [facility] Cardiologist: Chrystie Nose, MD   Interval Summary  .    67 yr old female with PMH of insulin dependent type 2 DM, HLD, permanent A fib s/p AV nde ablation 01/02/23, single chamber PPM in situ, who is currently admitted under cardiology for angina.   Patient states she had more chest pain radiating to her right upper shoulder and jaw last night while walking to the bathroom, episode lasted 25 minutes and resolved with nitroglycerin. She is chest pain free currently. She has been urinating without issue. She denied dysuria, fever, oliguria. She has agreed with LHC today.   Vital Signs .    Vitals:   03/03/23 0247 03/03/23 0347 03/03/23 0630 03/03/23 0746  BP: 120/68 123/66 113/62 (!) 140/76  Pulse: (!) 59 60 62 65  Resp:  18 18 18   Temp:  98.3 F (36.8 C) 98.3 F (36.8 C) 97.6 F (36.4 C)  TempSrc:  Oral Oral Oral  SpO2: 100% 94% 93% 90%  Weight:      Height:        Intake/Output Summary (Last 24 hours) at 03/03/2023 0830 Last data filed at 03/02/2023 1610 Gross per 24 hour  Intake 411.42 ml  Output --  Net 411.42 ml      03/01/2023    3:01 PM 03/01/2023    9:24 AM 02/02/2023    8:40 AM  Last 3 Weights  Weight (lbs) 180 lb 12.4 oz 174 lb 171 lb 6.4 oz  Weight (kg) 82 kg 78.926 kg 77.747 kg      Telemetry/ECG    V paced rhythm  - Personally Reviewed  Physical Exam .   GEN: No acute distress.   Neck: No JVD Cardiac: RRR, soft systolic murmur noted  Respiratory: Clear to auscultation bilaterally. On room air  GI: Soft, nontender, non-distended  MS: No leg edema  Assessment & Plan .     Exertional angina  Pulmonary hypertension  - presented with chest pain, SOB while walking, improved with resting  - Hs trop 19 >29 - EKG v paced rhtyhm  - LDL 32 - Echo updated with LVEF 60-65%, no RWMA, indeterminate diastolic parameter, elevated LA pressure, RV pressure and volume overload, RV  mildly reduced, RV moderately enlarged, severely elevated PASP 85.25mmHg, mod RAE, mild MR, mod TR, aortic sclerosis  - CT coronary study initially planned for ischemic evaluation, due to rising Cr this was held; given CKD as well as continued angina, decision was made to proceed with LHC for diagnostic/therapeutic evaluation also would minimize contrast use  - NPO, plan for left and right heart cath today, Cr elevated than baseline but stable today with IVF - continue medical therapy for presumed CAD with ASA 81mg  daily, metoprolol XL 100mg ,  lipitor 40mg , fenofibrate 160mg , vascepa 2g, and Imdur 30mg    CKD IIIa - Cr baseline 1-1.1, 1.21 POA, 1.47 >1.49, slightly worsened than baseline but overall stable in the past 48 hours despite IVF hydration  - continue hold jardiance, continue IVF peri-cath hydration, will check UA and renal ultrasound today  - avoid nephrotoxic agents   Type 2 DM  - A1C 10.2% from 08/22/22 , repeat A1C pending  - she takes actos, jardiance, victoza, Lantus 55 unit at bedtime, humalog 45 TID AC at home  - FBG 72 this morning, glycemic trend 72-180, improving,  to void hypoglycemia, will reduce semglee from 30 to 20 unit at bedtime; continue novolog 10unit  TID AC + SSI AC,  given less PO intake in the hospital, hold PTA actos, jardiance, victoza, monitor glycemic trend, goal 100-180.  [facility] diet when resume PO intake   Permanent A fib Hx of tachy-brady syndrome  - s/p single chamber medtronic PPM, device interrogation at ED by Dr Royann Shivers showed normal function, 100% ventricular pacing, no episode of high ventricular rate, Lead parameters are excellent. Estimated generator longevity is 14 years.  - held PTA Eliquis 5mg  BID before cath (last dose was 03/02/23 8am),   continue PTA diltiazem 120mg  daily and metoprolol XL 100mg  daily   Normocytic anemia  - Hgb 10.6 on CBC, was 12.8 on 12/08/22 - labs including reti, iron panel, B12/ folate ordered but not done yesterday, please  obtain  - need outpatient PCP follow up on this   DVT prophylaxis - was on therapeutic anticoagulation with Eliquis , last dose 03/02/23 AM, held before cath today, plan to resume post cath     AD: Full code  Dispo: pending clinical course        For questions or updates, please contact [facility] Please consult [email or web address] for contact info under        Signed, Cyndi Bender, NP

## 2023-03-03 NOTE — Progress Notes (Signed)
ANTICOAGULATION CONSULT NOTE - Initial Consult  Pharmacy Consult for Heparin 8hr after sheath removal Indication:  ACS/STEMI; afib on Eliquis  Allergies  Allergen Reactions   Trulicity [Dulaglutide] Diarrhea    Patient Measurements: Height: 5\' 3"  (160 cm) Weight: 82 kg (180 lb 12.4 oz) IBW/kg (Calculated) : 52.4 Heparin Dosing Weight: 70.5 kg  Vital Signs: Temp: 98 F (36.7 C) (09/17 1617) Temp Source: Temporal (09/17 1617) BP: 123/74 (09/17 1617) Pulse Rate: 60 (09/17 1617)  Labs: Recent Labs    03/01/23 0954 03/01/23 1246 03/02/23 0422 03/03/23 0434 03/03/23 1302 03/03/23 1505 03/03/23 1511  HGB 10.6*  --   --   --  9.5* 10.5* 10.5*  HCT 33.5*  --   --   --  30.6* 31.0* 31.0*  PLT 164  --   --   --  165  --   --   CREATININE 1.21*  --  1.47* 1.49*  --   --   --   TROPONINIHS 19* 29*  --   --   --   --   --     Estimated Creatinine Clearance: 38.2 mL/min (A) (by C-G formula based on SCr of 1.49 mg/dL (H)).   Medical History: Past Medical History:  Diagnosis Date   Arthritis    Atrial fibrillation (HCC)    a. 09/2016 in setting of pancreatitis;  b. 09/2016 Echo: EF 65-60%, no rwma, Gr2 DD, mild MR, triv TR, PASP ;  CHA2DS2VASc = 4-->Eliquis 5mg  BID. c. Recurred 06/2018 in setting of GIB.   Chronic diastolic CHF (congestive heart failure) (HCC) 01/28/2018   Diabetes mellitus    GI bleeding 06/2018   Gout    Hyperlipidemia    Hypertension    Hypertriglyceridemia    Pancreatitis    a. 09/2016 - Triglycerides 1,392 on admission.    Medications:  Scheduled:   [MAR Hold] aspirin EC  81 mg Oral Daily   [MAR Hold] atorvastatin  40 mg Oral Daily   [MAR Hold] diltiazem  120 mg Oral Daily   [MAR Hold] fenofibrate  160 mg Oral Daily   [MAR Hold] ferrous sulfate  325 mg Oral Q breakfast   furosemide  40 mg Intravenous Once   furosemide  80 mg Intravenous BID   [MAR Hold] icosapent Ethyl  2 g Oral BID   [MAR Hold] influenza vaccine adjuvanted  0.5 mL  Intramuscular Tomorrow-1000   [MAR Hold] insulin aspart  0-15 Units Subcutaneous TID WC   [MAR Hold] insulin aspart  10 Units Subcutaneous TID WC   [MAR Hold] insulin glargine-yfgn  20 Units Subcutaneous QHS   [START ON 03/04/2023] isosorbide mononitrate  60 mg Oral Daily   [MAR Hold] metoprolol succinate  100 mg Oral Daily   sodium chloride flush  3 mL Intravenous Q12H   Infusions:   sodium chloride     sodium chloride     PRN: sodium chloride, [MAR Hold] acetaminophen, hydrALAZINE, labetalol, [MAR Hold] nitroGLYCERIN, [MAR Hold] ondansetron (ZOFRAN) IV, sodium chloride flush  Assessment: 67 yo female on chronic Eliquis for afib. 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 Pharmacy consulted to start IV heparin 8hr s/p sheath removal (done 15:22) while undergoing assessment for potential multivessel PCI versus CABG. Since on Eliquis, will utilize aPTT for monitoring heparin - last dose Eliquis 9/16 at 08:20. Hgb 10.5, Plts 165, SCr 1.49  Goal of Therapy:  Heparin level 0.3-0.7 units/ml aPTT 66-102 sec Monitor platelets by anticoagulation protocol: Yes   Plan:  At 23:30, begin  IV heparin 1000 units/hr In 8hrs, check aPTT and heparin level until correlating Check CBC, monitor for signs/symptoms of bleeding  Loralee Pacas, PharmD, BCPS 03/03/2023,4:38 PM  Please check AMION for all [facility] phone numbers After 10:00 PM, call Main Pharmacy [phone]

## 2023-03-03 NOTE — Consult Note (Addendum)
Advanced Heart Failure Team Consult Note   Primary Physician: Marcine Matar, MD PCP-Cardiologist:  Chrystie Nose, MD  Reason for Consultation: A/C HFpEF  HPI:    Angeleigh Wurth is seen today for evaluation of A/C HFpEF at the request of Dr Herbie Baltimore.   Ms Delacueva is a 67 year old with a history of DMII, HLD, CKD Stage II , permanent  A Fib, 01/02/2023 AV node ablation  and single chamber PPM.   Initially felt a little better. Saw EP in August and reported increased shortness of breath. Hypotensive on arrival. Dig stopped and LRL dropped to 70.   Presented to ED with exertional chest pain and increased shortness of breath. HS Trop 19>29, Hgb 10.6, and creatinine 1.2. CXR no acute findings. Cardiology consulted for admit. CT initially ordered but creatinine bumped. Echo EF 60-65% RV mildly reduced, RA moderately reduced and RVSP 85.    RHC [facility] today with elevated filling pressures, PVR 2.9, CO 4.7, and CI 2.57.   RHC [facility] with multivessel disease.  Prox RCA lesion is 99% stenosed. Subtotal Occlusion - TIMI 2 flow   Mid RCA lesion is 80% stenosed.  Mid RCA to Dist RCA lesion is 90% stenosed. Dist RCA lesion is 70% stenosed.   Mid Cx lesion is 80% stenosed prior to distal OM-LPL   Mid LAD lesion is 50% stenosed. Dist LAD lesion is 90% stenosed.   2nd Diag lesion is 70% stenosed. RA 19  PA 82/28 (50) PCWP 36  PVR 2.9  CO 4.7 CI 2.57    Review of Systems: [y] = yes, [ ]  = no   General: Weight gain [ ] ; Weight loss [ ] ; Anorexia [ ] ; Fatigue [Y ]; Fever [ ] ; Chills [ ] ; Weakness [ ]   Cardiac: Chest pain/pressure [ ] ; Resting SOB [ ] ; Exertional SOB [Y ]; Orthopnea [ ] ; Pedal Edema [ ] ; Palpitations [ ] ; Syncope [ ] ; Presyncope [ ] ; Paroxysmal nocturnal dyspnea[ ]   Pulmonary: Cough [ ] ; Wheezing[ ] ; Hemoptysis[ ] ; Sputum [ ] ; Snoring [ ]   GI: Vomiting[ ] ; Dysphagia[ ] ; Melena[ ] ; Hematochezia [ ] ; Heartburn[ ] ; Abdominal pain [ ] ; Constipation [ ] ; Diarrhea [ ] ; BRBPR [ ]   GU:  Hematuria[ ] ; Dysuria [ ] ; Nocturia[ ]   Vascular: Pain in legs with walking [ ] ; Pain in feet with lying flat [ ] ; Non-healing sores [ ] ; Stroke [ ] ; TIA [ ] ; Slurred speech [ ] ;  Neuro: Headaches[ ] ; Vertigo[ ] ; Seizures[ ] ; Paresthesias[ ] ;Blurred vision [ ] ; Diplopia [ ] ; Vision changes [ ]   Ortho/Skin: Arthritis [ ] ; Joint pain [ ] ; Muscle pain [ ] ; Joint swelling [ ] ; Back Pain [ ] ; Rash [ ]   Psych: Depression[ ] ; Anxiety[ ]   Heme: Bleeding problems [ ] ; Clotting disorders [ ] ; Anemia [ ]   Endocrine: Diabetes [Y ]; Thyroid dysfunction[ ]   Home Medications Prior to Admission medications   Medication Sig Start Date End Date Taking? Authorizing Provider  acetaminophen (TYLENOL) 325 MG tablet Take 2 tablets (650 mg total) by mouth every 6 (six) hours as needed for mild pain (or Fever >/= 101). 02/01/18  Yes Elgergawy, Leana Roe, MD  apixaban (ELIQUIS) 5 MG TABS tablet Take 1 tablet (5 mg total) by mouth in the morning and at bedtime. 11/07/22  Yes Marcine Matar, MD  atorvastatin (LIPITOR) 40 MG tablet Take 1 tablet (40 mg total) by mouth daily. 06/23/22  Yes Marcine Matar, MD  ciclopirox (PENLAC) 8 %  solution Apply topically at bedtime. Apply over nail and surrounding skin. Apply daily over previous coat. After seven (7) days, may remove with alcohol and continue cycle. 08/29/22  Yes Vivi Barrack, DPM  diclofenac Sodium (VOLTAREN) 1 % GEL Apply 2 g topically 4 (four) times daily. Apply across lower back for back pain Patient taking differently: Apply 2 g topically 4 (four) times daily as needed (pain). Apply across lower back for back pain 11/18/22  Yes Marcine Matar, MD  diltiazem (CARDIZEM CD) 120 MG 24 hr capsule Take 1 capsule (120 mg total) by mouth daily. 06/03/22  Yes Marinus Maw, MD  fenofibrate (TRICOR) 48 MG tablet Take 1 tablet (48 mg total) by mouth daily. 11/19/22  Yes Marcine Matar, MD  ferrous sulfate 325 (65 FE) MG tablet Take 1 tablet (325 mg total) by mouth  daily with breakfast. 04/11/21 03/01/23 Yes McClung, Marzella Schlein, PA-C  insulin glargine (LANTUS SOLOSTAR) 100 UNIT/ML Solostar Pen Inject 55 Units into the skin daily. 11/18/22  Yes Marcine Matar, MD  insulin lispro (HUMALOG KWIKPEN) 100 UNIT/ML KwikPen Max daily 45 units Patient taking differently: Inject 12 Units into the skin with breakfast, with lunch, and with evening meal. 08/22/22  Yes Shamleffer, Konrad Dolores, MD  liraglutide (VICTOZA) 18 MG/3ML SOPN Inject 1.8 mg into the skin daily. 08/22/22  Yes Shamleffer, Konrad Dolores, MD  metoprolol succinate (TOPROL-XL) 100 MG 24 hr tablet Take 1 tablet (100 mg total) by mouth daily. Take with or immediately following a meal. 03/04/22  Yes Riddle, Luella Cook, NP  Multiple Vitamins-Minerals (CENTRUM SILVER PO) Take 1 tablet by mouth daily.   Yes [provider]  Multiple Vitamins-Minerals (OCUVITE ADULT 50+) CAPS Take 1 capsule by mouth daily.   Yes [provider]  pioglitazone (ACTOS) 15 MG tablet Take 1 tablet (15 mg total) by mouth daily. 08/22/22  Yes Shamleffer, Konrad Dolores, MD  Accu-Chek Softclix Lancets lancets Use to check blood sugar 3 times daily. 01/13/23   Marcine Matar, MD  Blood Glucose Monitoring Suppl (ACCU-CHEK GUIDE) w/Device KIT Use to check blood sugar 3 times daily. 01/13/23   Marcine Matar, MD  Blood Pressure Monitoring (BLOOD PRESSURE MONITOR/ARM) DEVI Check blood pressure once per day at least 2 hours after medications. 07/22/21   Alver Sorrow, NP  Continuous Blood Gluc Sensor (DEXCOM G7 SENSOR) MISC 1 Device by Does not apply route as directed. 08/22/22   Shamleffer, Konrad Dolores, MD  empagliflozin (JARDIANCE) 25 MG TABS tablet Take 1 tablet (25 mg total) by mouth daily before breakfast. Patient not taking: Reported on 03/01/2023 01/26/23   Shamleffer, Konrad Dolores, MD  glucose blood (ACCU-CHEK GUIDE) test strip Use to check blood sugar 3 times daily. 01/13/23   Marcine Matar, MD  Insulin Pen  Needle 32G X 4 MM MISC use in the morning, at noon, in the evening, and at bedtime. 08/22/22   Shamleffer, Konrad Dolores, MD  Insulin Syringe-Needle U-100 (TRUEPLUS INSULIN SYRINGE) 31G X 5/16" 0.3 ML MISC Use to inject insulin daily. Patient taking differently: 1 each by Other route See admin instructions. Use to inject insulin daily. 05/06/18   Cain Saupe, MD    Past Medical History: Past Medical History:  Diagnosis Date   Arthritis    Atrial fibrillation (HCC)    a. 09/2016 in setting of pancreatitis;  b. 09/2016 Echo: EF 65-60%, no rwma, Gr2 DD, mild MR, triv TR, PASP ;  CHA2DS2VASc = 4-->Eliquis 5mg  BID. c. Recurred 06/2018  in setting of GIB.   Chronic diastolic CHF (congestive heart failure) (HCC) 01/28/2018   Diabetes mellitus    GI bleeding 06/2018   Gout    Hyperlipidemia    Hypertension    Hypertriglyceridemia    Pancreatitis    a. 09/2016 - Triglycerides 1,392 on admission.    Past Surgical History: Past Surgical History:  Procedure Laterality Date   AV NODE ABLATION N/A 01/02/2023   Procedure: AV NODE ABLATION;  Surgeon: Marinus Maw, MD;  Location: [facility] INVASIVE CV LAB;  Service: Cardiovascular;  Laterality: N/A;   BIOPSY  06/20/2018   Procedure: BIOPSY;  Surgeon: Vida Rigger, MD;  Location: [facility] ENDOSCOPY;  Service: Endoscopy;;   CHOLECYSTECTOMY N/A 01/04/2014   Procedure: LAPAROSCOPIC CHOLECYSTECTOMY WITH INTRAOPERATIVE CHOLANGIOGRAM;  Surgeon: Axel Filler, MD;  Location: [facility] OR;  Service: General;  Laterality: N/A;   COLONOSCOPY WITH PROPOFOL N/A 06/21/2018   Procedure: COLONOSCOPY WITH PROPOFOL;  Surgeon: Bernette Redbird, MD;  Location: [facility] ENDOSCOPY;  Service: Endoscopy;  Laterality: N/A;   ESOPHAGOGASTRODUODENOSCOPY (EGD) WITH PROPOFOL N/A 06/20/2018   Procedure: ESOPHAGOGASTRODUODENOSCOPY (EGD) WITH PROPOFOL;  Surgeon: Vida Rigger, MD;  Location: [facility] ENDOSCOPY;  Service: Endoscopy;  Laterality: N/A;   LUNG SURGERY     PACEMAKER IMPLANT N/A 03/03/2022   Procedure:  PACEMAKER IMPLANT;  Surgeon: Marinus Maw, MD;  Location: [facility] INVASIVE CV LAB;  Service: Cardiovascular;  Laterality: N/A;    Family History: Family History  Problem Relation Age of Onset   Heart attack Mother    Heart attack Father    Diabetes Sister    High blood pressure Neg Hx    High Cholesterol Neg Hx     Social History: Social History   Socioeconomic History   Marital status: Single    Spouse name: Not on file   Number of children: Not on file   Years of education: Not on file   Highest education level: Not on file  Occupational History   Not on file  Tobacco Use   Smoking status: Never   Smokeless tobacco: Never  Vaping Use   Vaping status: Never Used  Substance and Sexual Activity   Alcohol use: No   Drug use: No   Sexual activity: Not Currently  Other Topics Concern   Not on file  Social History Narrative   Lives with her sister and does not need any assistance with ADLs.     Social Determinants of Health   Financial Resource Strain: Medium Risk (09/15/2022)   Overall Financial Resource Strain (CARDIA)    Difficulty of Paying Living Expenses: Somewhat hard  Food Insecurity: No Food Insecurity (03/02/2023)   Hunger Vital Sign    Worried About Running Out of Food in the Last Year: Never true    Ran Out of Food in the Last Year: Never true  Transportation Needs: No Transportation Needs (03/02/2023)   PRAPARE - Administrator, Civil Service (Medical): No    Lack of Transportation (Non-Medical): No  Physical Activity: Insufficiently Active (09/15/2022)   Exercise Vital Sign    Days of Exercise per Week: 4 days    Minutes of Exercise per Session: 10 min  Stress: Stress Concern Present (07/21/2022)   [employer] of Occupational Health - Occupational Stress Questionnaire    Feeling of Stress : Very much  Social Connections: Socially Isolated (09/15/2022)   Social Connection and Isolation Panel [NHANES]    Frequency of Communication with Friends  and Family: Once a week  Frequency of Social Gatherings with Friends and Family: More than three times a week    Attends Religious Services: Never    Database administrator or Organizations: No    Attends Banker Meetings: Never    Marital Status: Never married    Allergies:  Allergies  Allergen Reactions   Trulicity [Dulaglutide] Diarrhea    Objective:    Vital Signs:   Temp:  [97.6 F (36.4 C)-98.3 F (36.8 C)] 98.3 F (36.8 C) (09/17 1200) Pulse Rate:  [59-69] 60 (09/17 1555) Resp:  [16-27] 25 (09/17 1555) BP: (95-140)/(48-78) 125/73 (09/17 1555) SpO2:  [85 %-100 %] 95 % (09/17 1550) Last BM Date : 03/02/23  Weight change: Filed Weights   03/01/23 0924 03/01/23 1501  Weight: 78.9 kg 82 kg    Intake/Output:   Intake/Output Summary (Last 24 hours) at 03/03/2023 1609 Last data filed at 03/03/2023 1133 Gross per 24 hour  Intake 411.42 ml  Output 500 ml  Net -88.58 ml      Physical Exam    General:  No resp difficulty HEENT: normal Neck: supple. JVP to jaw  . Carotids 2+ bilat; no bruits. No lymphadenopathy or thyromegaly appreciated. Cor: PMI nondisplaced. Regular rate & rhythm. No rubs, gallops or murmurs. Lungs: clear Abdomen: soft, nontender, nondistended. No hepatosplenomegaly. No bruits or masses. Good bowel sounds. Extremities: no cyanosis, clubbing, rash, edema Neuro: alert & orientedx3, cranial nerves grossly intact. moves all 4 extremities w/o difficulty. Affect pleasant   Telemetry  V Paced   EKG    V Paced 60   Labs   Basic Metabolic Panel: Recent Labs  Lab 03/01/23 0954 03/02/23 0422 03/03/23 0434 03/03/23 1505 03/03/23 1511  NA 135 135 137 141 141  K 4.2 4.6 4.3 3.8 3.9  CL 106 106 108  --   --   CO2 19* 22 19*  --   --   GLUCOSE 241* 286* 150*  --   --   BUN 36* 43* 47*  --   --   CREATININE 1.21* 1.47* 1.49*  --   --   CALCIUM 8.8* 8.7* 8.6*  --   --     Liver Function Tests: No results for input(s): "AST",  "ALT", "ALKPHOS", "BILITOT", "PROT", "ALBUMIN" in the last 168 hours. No results for input(s): "LIPASE", "AMYLASE" in the last 168 hours. No results for input(s): "AMMONIA" in the last 168 hours.  CBC: Recent Labs  Lab 03/01/23 0954 03/03/23 1302 03/03/23 1505 03/03/23 1511  WBC 6.9 6.1  --   --   NEUTROABS  --  3.7  --   --   HGB 10.6* 9.5* 10.5* 10.5*  HCT 33.5* 30.6* 31.0* 31.0*  MCV 92.5 90.3  --   --   PLT 164 165  --   --     Cardiac Enzymes: No results for input(s): "CKTOTAL", "CKMB", "CKMBINDEX", "TROPONINI" in the last 168 hours.  BNP: BNP (last 3 results) No results for input(s): "BNP" in the last 8760 hours.  ProBNP (last 3 results) No results for input(s): "PROBNP" in the last 8760 hours.   CBG: Recent Labs  Lab 03/02/23 1221 03/02/23 1605 03/02/23 2310 03/03/23 0745 03/03/23 1158  GLUCAP 180* 72 115* 120* 95    Coagulation Studies: No results for input(s): "LABPROT", "INR" in the last 72 hours.   Imaging   No results found.   Medications:     Current Medications:  [MAR Hold] aspirin EC  81 mg Oral Daily   [  MAR Hold] atorvastatin  40 mg Oral Daily   [MAR Hold] diltiazem  120 mg Oral Daily   [MAR Hold] fenofibrate  160 mg Oral Daily   [MAR Hold] ferrous sulfate  325 mg Oral Q breakfast   [MAR Hold] icosapent Ethyl  2 g Oral BID   [MAR Hold] influenza vaccine adjuvanted  0.5 mL Intramuscular Tomorrow-1000   [MAR Hold] insulin aspart  0-15 Units Subcutaneous TID WC   [MAR Hold] insulin aspart  10 Units Subcutaneous TID WC   [MAR Hold] insulin glargine-yfgn  20 Units Subcutaneous QHS   [MAR Hold] isosorbide mononitrate  30 mg Oral Daily   [MAR Hold] metoprolol succinate  100 mg Oral Daily    Infusions:     Patient Profile  Ms Guertin is a 67 year old with a history of DMII, HLD, CKD Stage IIIa, permanent  A Fib, 01/02/2023 AV node ablation  and single chamber PPM.   Admitted with chest pain. Cath showed RA 19, PCWP 36, elevated PA  pressures, PVR 2.9, CO 4.7, CI 2.7   Assessment/Plan  1. Chest Pain HS Trop 19>29  LHC- Multivessel disease.   Prox RCA lesion is 99% stenosed. Subtotal Occlusion - TIMI 2 flow   Mid RCA lesion is 80% stenosed.  Mid RCA to Dist RCA lesion is 90% stenosed. Dist RCA lesion is 70% stenosed.   Mid Cx lesion is 80% stenosed prior to distal OM-LPL   Mid LAD lesion is 50% stenosed. Dist LAD lesion is 90% stenosed.   2nd Diag lesion is 70% stenosed. - On aspirin, atorvastatin, Toprol XL, and imdur .  -Final plan for intervention with PCI versus CABG pending.   2. A/C HFpEF -Echo 60-65%  -Elevated filling pressures, RA 19, PCWP 36, CO 4.7, CI 2.6  -Start 80 mg IV lasix twice a day.  - Once diuresed will repeat cath to reassess. - Holding SGLT2i with UTI.    3. Pulmonary Venous HTN  - Known OSA.  -Elevated filling pressures. PVR 2.9  -Needs diuresis. Repeat cath after diuresed.   4. DMII -Check Hgb A1C -On SSI  - Hold SGLT2i with UTI.   5. Permanent A fib S/P AV Node ablation 12/2022.    6. UTI Urine culture pending.   7. AKI on CKD Stage II Creatinine 1.2>1.5   8. OSA Continue CPAP  9.Obesity. Body mass index is 32.02 kg/m.   Length of Stay: 0  Tonye Becket, NP  03/03/2023, 4:09 PM  Advanced Heart Failure Team Pager [phone] (M-F; 7a - 5p)  Please contact [facility] for night-coverage after hours (4p -7a ) and weekends on [email or web address]  Patient seen with NP, agree with the above note.   67 y.o. with history of permanent AF s/p AV nodal ablation and pacing, type 2 diabetes, CKD 3, and prior pancreatitis was admitted with unstable angina symptoms.  Patient reports about 2 years of dyspnea and chest tightness walking up a hill.  Symptoms worsened in the last few days prior to admission.  On the day of admission, she had chest tightness and dyspnea walking from car to house and had to stop 3 times.  She therefore came to ER.  HS-TnI slightly elevated with no trend.  She was  admitted for workup of unstable angina.   Echo was done and I reviewed, EF 60-65% with D-shaped interventricular septum, moderately dilated RV with mild RV dysfunction, mild MR, moderate TR, dilated IVC.  She had RHC/LHC today.  LHC with severe 3  vessel disease with subtotal RCA occlusion.  RHC showed markedly elevated right and left heart filling pressures with severe primarily pulmonary venous hypertension. Creatinine has been elevated from baseline at 1.49.   General: NAD Neck: JVP 16 cm, no thyromegaly or thyroid nodule.  Lungs: Clear to auscultation bilaterally with normal respiratory effort. CV: Nondisplaced PMI.  Heart regular S1/S2, no S3/S4, no murmur.  Trace ankle edema.  No carotid bruit.  Normal pedal pulses.  Abdomen: Soft, nontender, no hepatosplenomegaly, no distention.  Skin: Intact without lesions or rashes.  Neurologic: Alert and oriented x 3.  Psych: Normal affect. Extremities: No clubbing or cyanosis.  HEENT: Normal.   Assessment/Plan: 1. Acute on chronic diastolic CHF with prominent RV failure: Echo this admission with EF 60-65% with D-shaped interventricular septum, moderately dilated RV with mild RV dysfunction, mild MR, moderate TR, dilated IVC.  RHC showed mean RA 20, PA 82/28 mean 50, mean PCWP 36 with v-waves to 50, CI 2.57, PVR 2.9 WU => elevated right and left heart filling pressures with severe primarily pulmonary venous hypertension.  Significant volume overload will need diuresis, this will be complicated by CKD stage 3.  - Lasix 80 mg IV bid.  - Will restart SGLT2 inhibitor soon.  - Will likely need repeat RHC prior to possible CABG to ensure she is hemodynamically optimized.  2. CAD: Unstable angina at presentation, severe 3VD on cath. Diffuse RCA disease with subtotal occlusion and collaterals to PDA from left, 90% mid LAD stenosis, 80% mid LCx stenosis.  She is a diabetic, and I think that her best option would be total revascularization via CABG.  However,  will need hemodynamic optimization prior.  - Continue ASA 81 - Continue statin - Consult TCTS - Can continue current Toprol XL and Imdur. 3.  Pulmonary hypertension: Severe pulmonary venous hypertension with PVR only 2.9 WU.  OSA may contribute.  Treatment will be aggressive diuresis.  4. OSA: Continue CPAP.  5. Atrial fibrillation: Permanent.  S/p AV nodal ablation with PPM (has left bundle lead).  Paced QRS is narrow due to LB lead.   - Would stop diltiazem CD, not needed for rate control given AV nodal ablation.  - Heparin gtt to resume at appropriate interval post-cath.  6. AKI on CKD stage 3: Creatinine  higher than baseline, 1.49 today.  Suspect baseline diabetic nephropathy. Follow carefully with diuresis.  7. Type 2 diabetes: SSI for now.   Marca Ancona 03/03/2023 5:17 PM

## 2023-03-03 NOTE — Progress Notes (Addendum)
@   1:25 am: Pt complained of 10/10 Chest pain radiating to right shoulder and neck after walking to the bathroom. VS stable. PRN SL Nitroglycerine given x 2 doses. Pain started easing off to 2/10. ECG done. Dr on call was paged.   Today's Vitals   03/03/23 0127 03/03/23 0134 03/03/23 0147 03/03/23 0149  BP: 107/64 (!) 104/54 (!) 100/48   Pulse:   60 60  Resp: 18     Temp: 98.3 F (36.8 C)     TempSrc: Oral     SpO2: 91%  91% 93%  Weight:      Height:      PainSc: 10-Worst pain ever 6   4    0200: Pt pain free.

## 2023-03-03 NOTE — Progress Notes (Signed)
Patient Name: Emily Farmer Date of Encounter: 03/03/2023 [facility] Cardiologist: Chrystie Nose, MD   Interval Summary  .    67 yr old female with PMH of insulin dependent type 2 DM, HLD, permanent A fib s/p AV nde ablation 01/02/23, single chamber PPM in situ, who is currently admitted under cardiology for angina.   Patient states she had more chest pain radiating to her right upper shoulder and jaw last night while walking to the bathroom, episode lasted 25 minutes and resolved with nitroglycerin. She is chest pain free currently. She has been urinating without issue. She denied dysuria, fever, oliguria. She has agreed with LHC today.   Vital Signs .    Vitals:   03/03/23 0247 03/03/23 0347 03/03/23 0630 03/03/23 0746  BP: 120/68 123/66 113/62 (!) 140/76  Pulse: (!) 59 60 62 65  Resp:  18 18 18   Temp:  98.3 F (36.8 C) 98.3 F (36.8 C) 97.6 F (36.4 C)  TempSrc:  Oral Oral Oral  SpO2: 100% 94% 93% 90%  Weight:      Height:        Intake/Output Summary (Last 24 hours) at 03/03/2023 0830 Last data filed at 03/02/2023 1610 Gross per 24 hour  Intake 411.42 ml  Output --  Net 411.42 ml      03/01/2023    3:01 PM 03/01/2023    9:24 AM 02/02/2023    8:40 AM  Last 3 Weights  Weight (lbs) 180 lb 12.4 oz 174 lb 171 lb 6.4 oz  Weight (kg) 82 kg 78.926 kg 77.747 kg      Telemetry/ECG    V paced rhythm  - Personally Reviewed  Physical Exam .   GEN: No acute distress.   Neck: No JVD Cardiac: RRR, soft systolic murmur noted  Respiratory: Clear to auscultation bilaterally. On room air  GI: Soft, nontender, non-distended  MS: No leg edema  Assessment & Plan .     Exertional angina  Pulmonary hypertension  - presented with chest pain, SOB while walking, improved with resting  - Hs trop 19 >29 - EKG v paced rhtyhm  - LDL 32 - Echo updated with LVEF 60-65%, no RWMA, indeterminate diastolic parameter, elevated LA pressure, RV pressure and volume overload, RV  mildly reduced, RV moderately enlarged, severely elevated PASP 85.25mmHg, mod RAE, mild MR, mod TR, aortic sclerosis  - CT coronary study initially planned for ischemic evaluation, due to rising Cr this was held; given CKD as well as continued angina, decision was made to proceed with LHC for diagnostic/therapeutic evaluation also would minimize contrast use  - NPO, plan for left and right heart cath today, Cr elevated than baseline but stable today with IVF - continue medical therapy for presumed CAD with ASA 81mg  daily, metoprolol XL 100mg ,  lipitor 40mg , fenofibrate 160mg , vascepa 2g, and Imdur 30mg    CKD IIIa - Cr baseline 1-1.1, 1.21 POA, 1.47 >1.49, slightly worsened than baseline but overall stable in the past 48 hours despite IVF hydration  - continue hold jardiance, continue IVF peri-cath hydration, will check UA and renal ultrasound today  - avoid nephrotoxic agents   Type 2 DM  - A1C 10.2% from 08/22/22 , repeat A1C pending  - she takes actos, jardiance, victoza, Lantus 55 unit at bedtime, humalog 45 TID AC at home  - FBG 72 this morning, glycemic trend 72-180, improving,  to void hypoglycemia, will reduce semglee from 30 to 20 unit at bedtime; continue novolog 10unit  TID AC + SSI AC,  given less PO intake in the hospital, hold PTA actos, jardiance, victoza, monitor glycemic trend, goal 100-180.  [facility] diet when resume PO intake   Permanent A fib Hx of tachy-brady syndrome  - s/p single chamber medtronic PPM, device interrogation at ED by Dr Royann Shivers showed normal function, 100% ventricular pacing, no episode of high ventricular rate, Lead parameters are excellent. Estimated generator longevity is 14 years.  - held PTA Eliquis 5mg  BID before cath (last dose was 03/02/23 8am),   continue PTA diltiazem 120mg  daily and metoprolol XL 100mg  daily   Normocytic anemia  - Hgb 10.6 on CBC, was 12.8 on 12/08/22 - labs including reti, iron panel, B12/ folate ordered but not done yesterday, please  obtain  - need outpatient PCP follow up on this   DVT prophylaxis - was on therapeutic anticoagulation with Eliquis , last dose 03/02/23 AM, held before cath today, plan to resume post cath     AD: Full code  Dispo: pending clinical course        For questions or updates, please contact [facility] Please consult [email or web address] for contact info under        Signed, Cyndi Bender, NP

## 2023-03-03 NOTE — Plan of Care (Signed)
Problem: Education: Goal: Knowledge of cardiac device and self-care will improve Outcome: Progressing Goal: Ability to safely manage health related needs after discharge will improve Outcome: Progressing Goal: Individualized Educational Video(s) Outcome: Progressing   Problem: Cardiac: Goal: Ability to achieve and maintain adequate cardiopulmonary perfusion will improve Outcome: Progressing   Problem: Education: Goal: Understanding of disease, treatment, and recovery process will improve Outcome: Progressing   Problem: Activity: Goal: Ability to return to baseline activity level will improve Outcome: Progressing   Problem: Cardiac: Goal: Ability to maintain adequate cardiovascular perfusion will improve Outcome: Progressing Goal: Vascular access site(s) Level 0-1 will be maintained Outcome: Progressing   Problem: Health Behavior/ Discharge Planning: Goal: Ability to safely manage health related needs after discharge Outcome: Progressing   Problem: Education: Goal: Understanding of cardiac disease, CV risk reduction, and recovery process will improve Outcome: Progressing Goal: Individualized Educational Video(s) Outcome: Progressing   Problem: Activity: Goal: Ability to tolerate increased activity will improve Outcome: Progressing   Problem: Cardiac: Goal: Ability to achieve and maintain adequate cardiovascular perfusion will improve Outcome: Progressing   Problem: Health Behavior/Discharge Planning: Goal: Ability to safely manage health-related needs after discharge will improve Outcome: Progressing   Problem: Education: Goal: Ability to describe self-care measures that may prevent or decrease complications (Diabetes Survival Skills Education) will improve Outcome: Progressing Goal: Individualized Educational Video(s) Outcome: Progressing   Problem: Coping: Goal: Ability to adjust to condition or change in health will improve Outcome: Progressing   Problem:  Fluid Volume: Goal: Ability to maintain a balanced intake and output will improve Outcome: Progressing   Problem: Health Behavior/Discharge Planning: Goal: Ability to identify and utilize available resources and services will improve Outcome: Progressing Goal: Ability to manage health-related needs will improve Outcome: Progressing   Problem: Metabolic: Goal: Ability to maintain appropriate glucose levels will improve Outcome: Progressing   Problem: Nutritional: Goal: Maintenance of adequate nutrition will improve Outcome: Progressing Goal: Progress toward achieving an optimal weight will improve Outcome: Progressing   Problem: Skin Integrity: Goal: Risk for impaired skin integrity will decrease Outcome: Progressing   Problem: Tissue Perfusion: Goal: Adequacy of tissue perfusion will improve Outcome: Progressing   Problem: Education: Goal: Knowledge of General Education information will improve Description: Including pain rating scale, medication(s)/side effects and non-pharmacologic comfort measures Outcome: Progressing   Problem: Health Behavior/Discharge Planning: Goal: Ability to manage health-related needs will improve Outcome: Progressing   Problem: Clinical Measurements: Goal: Ability to maintain clinical measurements within normal limits will improve Outcome: Progressing Goal: Will remain free from infection Outcome: Progressing Goal: Diagnostic test results will improve Outcome: Progressing Goal: Respiratory complications will improve Outcome: Progressing Goal: Cardiovascular complication will be avoided Outcome: Progressing   Problem: Activity: Goal: Risk for activity intolerance will decrease Outcome: Progressing   Problem: Nutrition: Goal: Adequate nutrition will be maintained Outcome: Progressing   Problem: Coping: Goal: Level of anxiety will decrease Outcome: Progressing   Problem: Elimination: Goal: Will not experience complications related  to bowel motility Outcome: Progressing Goal: Will not experience complications related to urinary retention Outcome: Progressing   Problem: Pain Managment: Goal: General experience of comfort will improve Outcome: Progressing   Problem: Safety: Goal: Ability to remain free from injury will improve Outcome: Progressing   Problem: Skin Integrity: Goal: Risk for impaired skin integrity will decrease Outcome: Progressing   Problem: Education: Goal: Understanding of CV disease, CV risk reduction, and recovery process will improve Outcome: Progressing Goal: Individualized Educational Video(s) Outcome: Progressing   Problem: Activity: Goal: Ability to return to baseline  activity level will improve Outcome: Progressing   Problem: Cardiovascular: Goal: Ability to achieve and maintain adequate cardiovascular perfusion will improve Outcome: Progressing Goal: Vascular access site(s) Level 0-1 will be maintained Outcome: Progressing

## 2023-03-03 NOTE — Progress Notes (Signed)
RN called states patient had chest pain again after walking to the bathroom, also was dizzy, orthostatic with SBP 130 down to 80s, resting BP 130/70. Patient seen in the room, in bed, states pain improved with SL nitroglycerin. VSS. Telemetry showed V paced rhythm, no new alert. Advised the patient to remain bedrest until cardiac cath. Continue IVF hydration, will need to re-check orthostatic VS tomorrow. UA with pyuria, + nitrite. Will send urine culture. She has no urinary symptoms. Will monitor symptoms, fever curve, check CBC diff.

## 2023-03-03 NOTE — Progress Notes (Signed)
Site area- right  Site Prior to Removal- 0   Pressure Applied For-  25  MInutes   Bedrest Beginning at - 1645   Manual- Yes   Patient Status During Pull- Stable    Post Pull Groin Site- 0   Post Pull Instructions Given- Yes   Post Pull Pulses Present- Yes    Dressing Applied- Tegaderm and Gauze Dressing    Comments:  no s/s of hematoma, vss, no complaints from pt.

## 2023-03-03 NOTE — Interval H&P Note (Signed)
History and Physical Interval Note:  03/03/2023 2:44 PM  Recent APP note:   RN called states patient had chest pain again after walking to the bathroom, also was dizzy, orthostatic with SBP 130 down to 80s, resting BP 130/70. Patient seen in the room, in bed, states pain improved with SL nitroglycerin. VSS. Telemetry showed V paced rhythm, no new alert. Advised the patient to remain bedrest until cardiac cath. Continue IVF hydration, will need to re-check orthostatic VS tomorrow. UA with pyuria, + nitrite. Will send urine culture. She has no urinary symptoms. Will monitor symptoms, fever curve, check CBC diff.   Cyndi Bender, NP (Nurse Practitioner)        Emily Farmer  has presented today for surgery, with the diagnosis of chest pain.  The various methods of treatment have been discussed with the patient and family. After consideration of risks, benefits and other options for treatment, the patient has consented to  Procedure(s): RIGHT/LEFT HEART CATH AND CORONARY ANGIOGRAPHY (N/A)  PERCUTANEOUS CORONARY INTERVENTION  as a surgical intervention.  The patient's history has been reviewed, patient examined, no change in status, stable for surgery.  I have reviewed the patient's chart and labs.  Questions were answered to the patient's satisfaction.    Cath Lab Visit (complete for each Cath Lab visit)  Clinical Evaluation Leading to the Procedure:   ACS: Yes.    Non-ACS:    Anginal Classification: CCS III  Anti-ischemic medical therapy: Minimal Therapy (1 class of medications)  Non-Invasive Test Results: No non-invasive testing performed  Prior CABG: No previous CABG   Bryan Lemma

## 2023-03-04 ENCOUNTER — Encounter (HOSPITAL_COMMUNITY): Payer: Self-pay | Admitting: Cardiology

## 2023-03-04 DIAGNOSIS — D631 Anemia in chronic kidney disease: Secondary | ICD-10-CM | POA: Diagnosis present

## 2023-03-04 DIAGNOSIS — I2721 Secondary pulmonary arterial hypertension: Secondary | ICD-10-CM | POA: Diagnosis present

## 2023-03-04 DIAGNOSIS — I200 Unstable angina: Secondary | ICD-10-CM | POA: Diagnosis present

## 2023-03-04 DIAGNOSIS — E1165 Type 2 diabetes mellitus with hyperglycemia: Secondary | ICD-10-CM | POA: Diagnosis present

## 2023-03-04 DIAGNOSIS — Z01818 Encounter for other preprocedural examination: Secondary | ICD-10-CM | POA: Diagnosis not present

## 2023-03-04 DIAGNOSIS — Z950 Presence of cardiac pacemaker: Secondary | ICD-10-CM | POA: Diagnosis not present

## 2023-03-04 DIAGNOSIS — J918 Pleural effusion in other conditions classified elsewhere: Secondary | ICD-10-CM | POA: Diagnosis not present

## 2023-03-04 DIAGNOSIS — Z6832 Body mass index (BMI) 32.0-32.9, adult: Secondary | ICD-10-CM | POA: Diagnosis not present

## 2023-03-04 DIAGNOSIS — J95811 Postprocedural pneumothorax: Secondary | ICD-10-CM | POA: Diagnosis not present

## 2023-03-04 DIAGNOSIS — I429 Cardiomyopathy, unspecified: Secondary | ICD-10-CM | POA: Diagnosis present

## 2023-03-04 DIAGNOSIS — N1832 Chronic kidney disease, stage 3b: Secondary | ICD-10-CM | POA: Diagnosis present

## 2023-03-04 DIAGNOSIS — E113593 Type 2 diabetes mellitus with proliferative diabetic retinopathy without macular edema, bilateral: Secondary | ICD-10-CM | POA: Diagnosis present

## 2023-03-04 DIAGNOSIS — I259 Chronic ischemic heart disease, unspecified: Secondary | ICD-10-CM | POA: Diagnosis not present

## 2023-03-04 DIAGNOSIS — I25110 Atherosclerotic heart disease of native coronary artery with unstable angina pectoris: Secondary | ICD-10-CM

## 2023-03-04 DIAGNOSIS — J90 Pleural effusion, not elsewhere classified: Secondary | ICD-10-CM | POA: Diagnosis not present

## 2023-03-04 DIAGNOSIS — I442 Atrioventricular block, complete: Secondary | ICD-10-CM | POA: Diagnosis present

## 2023-03-04 DIAGNOSIS — I4821 Permanent atrial fibrillation: Secondary | ICD-10-CM | POA: Diagnosis present

## 2023-03-04 DIAGNOSIS — J9811 Atelectasis: Secondary | ICD-10-CM | POA: Diagnosis not present

## 2023-03-04 DIAGNOSIS — Z1152 Encounter for screening for COVID-19: Secondary | ICD-10-CM | POA: Diagnosis not present

## 2023-03-04 DIAGNOSIS — Z4682 Encounter for fitting and adjustment of non-vascular catheter: Secondary | ICD-10-CM | POA: Diagnosis not present

## 2023-03-04 DIAGNOSIS — N182 Chronic kidney disease, stage 2 (mild): Secondary | ICD-10-CM

## 2023-03-04 DIAGNOSIS — Z452 Encounter for adjustment and management of vascular access device: Secondary | ICD-10-CM | POA: Diagnosis not present

## 2023-03-04 DIAGNOSIS — E11649 Type 2 diabetes mellitus with hypoglycemia without coma: Secondary | ICD-10-CM | POA: Diagnosis not present

## 2023-03-04 DIAGNOSIS — R918 Other nonspecific abnormal finding of lung field: Secondary | ICD-10-CM | POA: Diagnosis not present

## 2023-03-04 DIAGNOSIS — R0989 Other specified symptoms and signs involving the circulatory and respiratory systems: Secondary | ICD-10-CM | POA: Diagnosis not present

## 2023-03-04 DIAGNOSIS — M109 Gout, unspecified: Secondary | ICD-10-CM | POA: Diagnosis present

## 2023-03-04 DIAGNOSIS — I5043 Acute on chronic combined systolic (congestive) and diastolic (congestive) heart failure: Secondary | ICD-10-CM | POA: Diagnosis not present

## 2023-03-04 DIAGNOSIS — I700 Atherosclerosis of aorta: Secondary | ICD-10-CM | POA: Diagnosis present

## 2023-03-04 DIAGNOSIS — I5033 Acute on chronic diastolic (congestive) heart failure: Secondary | ICD-10-CM | POA: Diagnosis present

## 2023-03-04 DIAGNOSIS — R079 Chest pain, unspecified: Secondary | ICD-10-CM | POA: Diagnosis not present

## 2023-03-04 DIAGNOSIS — N390 Urinary tract infection, site not specified: Secondary | ICD-10-CM | POA: Diagnosis not present

## 2023-03-04 DIAGNOSIS — I272 Pulmonary hypertension, unspecified: Secondary | ICD-10-CM

## 2023-03-04 DIAGNOSIS — J984 Other disorders of lung: Secondary | ICD-10-CM | POA: Diagnosis not present

## 2023-03-04 DIAGNOSIS — J939 Pneumothorax, unspecified: Secondary | ICD-10-CM | POA: Diagnosis not present

## 2023-03-04 DIAGNOSIS — N179 Acute kidney failure, unspecified: Secondary | ICD-10-CM

## 2023-03-04 DIAGNOSIS — I270 Primary pulmonary hypertension: Secondary | ICD-10-CM | POA: Diagnosis not present

## 2023-03-04 DIAGNOSIS — Z951 Presence of aortocoronary bypass graft: Secondary | ICD-10-CM | POA: Diagnosis not present

## 2023-03-04 DIAGNOSIS — Z48812 Encounter for surgical aftercare following surgery on the circulatory system: Secondary | ICD-10-CM | POA: Diagnosis not present

## 2023-03-04 DIAGNOSIS — E1122 Type 2 diabetes mellitus with diabetic chronic kidney disease: Secondary | ICD-10-CM | POA: Diagnosis present

## 2023-03-04 DIAGNOSIS — I5032 Chronic diastolic (congestive) heart failure: Secondary | ICD-10-CM | POA: Diagnosis not present

## 2023-03-04 DIAGNOSIS — I495 Sick sinus syndrome: Secondary | ICD-10-CM | POA: Diagnosis present

## 2023-03-04 DIAGNOSIS — E871 Hypo-osmolality and hyponatremia: Secondary | ICD-10-CM | POA: Diagnosis not present

## 2023-03-04 DIAGNOSIS — Z95810 Presence of automatic (implantable) cardiac defibrillator: Secondary | ICD-10-CM | POA: Diagnosis not present

## 2023-03-04 DIAGNOSIS — E782 Mixed hyperlipidemia: Secondary | ICD-10-CM | POA: Diagnosis present

## 2023-03-04 DIAGNOSIS — Z01810 Encounter for preprocedural cardiovascular examination: Secondary | ICD-10-CM | POA: Diagnosis not present

## 2023-03-04 DIAGNOSIS — I5031 Acute diastolic (congestive) heart failure: Secondary | ICD-10-CM | POA: Diagnosis not present

## 2023-03-04 DIAGNOSIS — I2510 Atherosclerotic heart disease of native coronary artery without angina pectoris: Secondary | ICD-10-CM | POA: Diagnosis not present

## 2023-03-04 DIAGNOSIS — E1151 Type 2 diabetes mellitus with diabetic peripheral angiopathy without gangrene: Secondary | ICD-10-CM | POA: Diagnosis present

## 2023-03-04 DIAGNOSIS — I130 Hypertensive heart and chronic kidney disease with heart failure and stage 1 through stage 4 chronic kidney disease, or unspecified chronic kidney disease: Secondary | ICD-10-CM | POA: Diagnosis present

## 2023-03-04 LAB — APTT: aPTT: 58 s — ABNORMAL HIGH (ref 24–36)

## 2023-03-04 LAB — GLUCOSE, CAPILLARY
Glucose-Capillary: 111 mg/dL — ABNORMAL HIGH (ref 70–99)
Glucose-Capillary: 91 mg/dL (ref 70–99)
Glucose-Capillary: 90 mg/dL (ref 70–99)
Glucose-Capillary: 96 mg/dL (ref 70–99)

## 2023-03-04 MED ORDER — SODIUM CHLORIDE 0.9 % IV SOLN
1.0000 g | INTRAVENOUS | Status: AC
  Administered 2023-03-04 – 2023-03-08 (×5): 1 g via INTRAVENOUS
  Filled 2023-03-04 (×5): qty 10

## 2023-03-04 MED ORDER — POTASSIUM CHLORIDE CRYS ER 20 MEQ PO TBCR
40.0000 meq | EXTENDED_RELEASE_TABLET | Freq: Once | ORAL | Status: AC
  Administered 2023-03-04: 40 meq via ORAL
  Filled 2023-03-04: qty 2

## 2023-03-04 MED ORDER — METOLAZONE 5 MG PO TABS
2.5000 mg | ORAL_TABLET | Freq: Once | ORAL | Status: AC
  Administered 2023-03-04: 2.5 mg via ORAL
  Filled 2023-03-04: qty 1

## 2023-03-04 MED ORDER — SPIRONOLACTONE 12.5 MG HALF TABLET
12.5000 mg | ORAL_TABLET | Freq: Every day | ORAL | Status: DC
  Administered 2023-03-04 – 2023-03-05 (×2): 12.5 mg via ORAL
  Filled 2023-03-04 (×2): qty 1

## 2023-03-04 MED ORDER — HEPARIN (PORCINE) 25000 UT/250ML-% IV SOLN
1250.0000 [IU]/h | INTRAVENOUS | Status: DC
  Administered 2023-03-04 – 2023-03-05 (×2): 1250 [IU]/h via INTRAVENOUS
  Filled 2023-03-04 (×3): qty 250

## 2023-03-04 MED ORDER — SODIUM CHLORIDE 0.9 % IV SOLN: 1.0000 g | INTRAVENOUS | Status: DC

## 2023-03-04 MED ORDER — POTASSIUM CHLORIDE CRYS ER 20 MEQ PO TBCR: 20.0000 meq | EXTENDED_RELEASE_TABLET | Freq: Once | ORAL | Status: DC

## 2023-03-04 MED FILL — Heparin Sodium (Porcine) Inj 1000 Unit/ML: INTRAMUSCULAR | Qty: 10 | Status: AC

## 2023-03-04 NOTE — Plan of Care (Signed)

## 2023-03-04 NOTE — Progress Notes (Signed)
   03/04/23 1718  Vitals  BP 95/64  MAP (mmHg) 72  BP Location Right Arm  BP Method Automatic  Patient Position (if appropriate) Sitting  MEWS COLOR  MEWS Score Color Green  MEWS Score  MEWS Temp 0  MEWS Systolic 1  MEWS Pulse 0  MEWS RR 0  MEWS LOC 0  MEWS Score 1   Ok to give IV lasix and PO metolazone per Brion Aliment NP. - see MAR

## 2023-03-04 NOTE — Progress Notes (Signed)
ANTICOAGULATION CONSULT NOTE   Pharmacy Consult for Heparin 8hr after sheath removal Indication:  ACS/STEMI; afib on Eliquis  Allergies  Allergen Reactions   Trulicity [Dulaglutide] Diarrhea    Patient Measurements: Height: 5\' 3"  (160 cm) Weight: 82 kg (180 lb 12.4 oz) IBW/kg (Calculated) : 52.4 Heparin Dosing Weight: 70.5 kg  Vital Signs: Temp: 98.1 F (36.7 C) (09/18 0728) Temp Source: Oral (09/18 0728) BP: 113/67 (09/18 0728) Pulse Rate: 61 (09/18 0728)  Labs: Recent Labs    03/01/23 0954 03/01/23 1246 03/02/23 0422 03/03/23 0434 03/03/23 1302 03/03/23 1505 03/03/23 1511 03/04/23 0644  HGB 10.6*  --   --   --  9.5* 10.5* 10.9*  10.5*  --   HCT 33.5*  --   --   --  30.6* 31.0* 32.0*  31.0*  --   PLT 164  --   --   --  165  --   --   --   APTT  --   --   --   --   --   --   --  54*  HEPARINUNFRC  --   --   --   --   --   --   --  >1.10*  CREATININE 1.21*  --  1.47* 1.49*  --   --   --  1.32*  TROPONINIHS 19* 29*  --   --   --   --   --   --     Estimated Creatinine Clearance: 43.1 mL/min (A) (by C-G formula based on SCr of 1.32 mg/dL (H)).   Medical History: Past Medical History:  Diagnosis Date   Arthritis    Atrial fibrillation (HCC)    a. 09/2016 in setting of pancreatitis;  b. 09/2016 Echo: EF 65-60%, no rwma, Gr2 DD, mild MR, triv TR, PASP ;  CHA2DS2VASc = 4-->Eliquis 5mg  BID. c. Recurred 06/2018 in setting of GIB.   Chronic diastolic CHF (congestive heart failure) (HCC) 01/28/2018   Diabetes mellitus    GI bleeding 06/2018   Gout    Hyperlipidemia    Hypertension    Hypertriglyceridemia    Pancreatitis    a. 09/2016 - Triglycerides 1,392 on admission.    Medications:  Scheduled:   aspirin EC  81 mg Oral Daily   atorvastatin  40 mg Oral Daily   fenofibrate  160 mg Oral Daily   ferrous sulfate  325 mg Oral Q breakfast   furosemide  40 mg Intravenous Once   furosemide  80 mg Intravenous BID   icosapent Ethyl  2 g Oral BID   influenza  vaccine adjuvanted  0.5 mL Intramuscular Tomorrow-1000   insulin aspart  0-15 Units Subcutaneous TID WC   insulin aspart  10 Units Subcutaneous TID WC   insulin glargine-yfgn  20 Units Subcutaneous QHS   isosorbide mononitrate  60 mg Oral Daily   metoprolol succinate  100 mg Oral Daily   sodium chloride flush  3 mL Intravenous Q12H   Infusions:   sodium chloride     heparin 1,000 Units/hr (03/04/23 0313)   PRN: sodium chloride, acetaminophen, nitroGLYCERIN, ondansetron (ZOFRAN) IV, sodium chloride flush  Assessment: 67 yo female on chronic Eliquis for afib. 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 Pharmacy consulted to start IV heparin while undergoing assessment for potential multivessel PCI versus CABG. Since on Eliquis, will utilize aPTT for monitoring heparin  -aPTT= 54 on 1000 units/hr  Goal of Therapy:  Heparin level 0.3-0.7 units/ml aPTT 66-102 sec Monitor  platelets by anticoagulation protocol: Yes   Plan:  -Increase heparin to 1150 units/hr -aPTT in 8 hrs  Harland German, PharmD Clinical Pharmacist **Pharmacist phone directory can now be found on [email or web address] ([email or web address]).  Listed under [facility].

## 2023-03-04 NOTE — TOC Initial Note (Signed)
Transition of Care [facility]) - Initial/Assessment Note    Patient Details  Name: Emily Farmer MRN: [id number] Date of Birth: 07-21-1955  Transition of Care [facility]) CM/SW Contact:    Nicanor Bake Phone Number: [phone] 03/04/2023, 12:34 PM  Clinical Narrative:    HF CSW met with the pt at bedside. CSW and pt built rapport. Pt lives at home with her sister. Pt and sister have no transportation, and use public transport system to get around. Pt stated that she has a HH services 2-3 years ago with [place]. Pt stated that she used a can back in 2004 when she fell and injured her left leg. Pt has not used any other equipment. Pt has a scale at  home and needs to double check that it works. CSW explained that a follow up hospital appointment will be scheduled closer to dc date.   TOC will continue following.                Expected Discharge Plan: Home/Self Care Barriers to Discharge: Continued Medical Work up   Patient Goals and CMS Choice            Expected Discharge Plan and Services       Living arrangements for the past 2 months: Apartment                                      Prior Living Arrangements/Services Living arrangements for the past 2 months: Apartment Lives with:: Siblings Patient language and need for interpreter reviewed:: Yes Do you feel safe going back to the place where you live?: Yes      Need for Family Participation in Patient Care: No (Comment) Care giver support system in place?: Yes (comment)   Criminal Activity/Legal Involvement Pertinent to Current Situation/Hospitalization: No - Comment as needed  Activities of Daily Living Home Assistive Devices/Equipment: CPAP ADL Screening (condition at time of admission) Patient's cognitive ability adequate to safely complete daily activities?: Yes Is the patient deaf or have difficulty hearing?: No Does the patient have difficulty seeing, even when wearing glasses/contacts?: No Does the  patient have difficulty concentrating, remembering, or making decisions?: No Patient able to express need for assistance with ADLs?: Yes Does the patient have difficulty dressing or bathing?: No Independently performs ADLs?: Yes (appropriate for developmental age) Does the patient have difficulty walking or climbing stairs?: Yes Weakness of Legs: None Weakness of Arms/Hands: None  Permission Sought/Granted                  Emotional Assessment Appearance:: Appears stated age Attitude/Demeanor/Rapport: Engaged Affect (typically observed): Appropriate Orientation: : Oriented to Self, Oriented to Place, Oriented to  Time, Oriented to Situation Alcohol / Substance Use: Not Applicable Psych Involvement: No (comment)  Admission diagnosis:  Accelerating angina (HCC) [I20.0] Patient Active Problem List   Diagnosis Date Noted   Accelerating angina (HCC) 03/01/2023   Stage 3b chronic kidney disease (HCC) 11/18/2022   Proliferative diabetic retinopathy of both eyes (HCC) 10/29/2022   Peripheral arterial disease (HCC) 10/14/2022   Polyneuropathy associated with underlying disease (HCC) 08/22/2022   Type 2 diabetes mellitus with peripheral neuropathy (HCC) 08/22/2022   Pacemaker 06/03/2022   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   Bacteremia 04/01/2021   Atrial fibrillation with slow ventricular response (HCC) 03/31/2021   Pulmonary hypertension (HCC) 03/19/2021   Hypomagnesemia    Sepsis (HCC) 02/27/2021   Atrial fibrillation  with rapid ventricular response (HCC) 02/27/2021   Hypotension 02/27/2021   Secondary hypercoagulable state (HCC) 05/08/2020   Dyslipidemia 09/23/2019   Toenail fungus 07/19/2018   Depression 07/19/2018   Iron deficiency anemia due to chronic blood loss 07/19/2018   Gastritis and duodenitis 07/19/2018   Chronic a-fib (HCC) 01/28/2018   Hyponatremia 01/28/2018   Chronic diastolic CHF (congestive heart failure) (HCC) 01/28/2018   Mixed  hyperlipidemia 06/01/2017   Paroxysmal A-fib (HCC) 10/04/2016   Hypokalemia 01/04/2014   Acute pyelonephritis 02/15/2013   Essential hypertension 02/15/2013   Hypertriglyceridemia 02/15/2013   Coronary atherosclerosis seen on CT 02/15/2013   PCP:  Marcine Matar, MD Pharmacy:   [facility] [address] [place] [address] Phone: [phone] Fax: [phone]  [id number] - [place], [place] - [address] AT [facility] [address] [place] [address] Phone: [phone] Fax: [phone]  [facility] [address] [place] [address] Phone: [phone] Fax: [phone]     Social Determinants of Health (SDOH) Social History: SDOH Screenings   Food Insecurity: No Food Insecurity (03/02/2023)  Housing: Low Risk  (03/02/2023)  Transportation Needs: No Transportation Needs (03/02/2023)  Utilities: Not At Risk (03/02/2023)  Alcohol Screen: Low Risk  (09/15/2022)  Depression (PHQ2-9): Low Risk  (11/18/2022)  Financial Resource Strain: Medium Risk (09/15/2022)  Physical Activity: Insufficiently Active (09/15/2022)  Social Connections: Socially Isolated (09/15/2022)  Stress: Stress Concern Present (07/21/2022)  Tobacco Use: Low Risk  (03/01/2023)   SDOH Interventions:     Readmission Risk Interventions    04/03/2021    2:32 PM  Readmission Risk Prevention Plan  Transportation Screening Complete  PCP or Specialist Appt within 5-7 Days Complete  Home Care Screening Complete  Medication Review (RN CM) Complete

## 2023-03-04 NOTE — Plan of Care (Signed)
Problem: Education: Goal: Knowledge of cardiac device and self-care will improve Outcome: Progressing Goal: Ability to safely manage health related needs after discharge will improve Outcome: Progressing Goal: Individualized Educational Video(s) Outcome: Progressing   Problem: Cardiac: Goal: Ability to achieve and maintain adequate cardiopulmonary perfusion will improve Outcome: Progressing   Problem: Education: Goal: Understanding of disease, treatment, and recovery process will improve Outcome: Progressing   Problem: Activity: Goal: Ability to return to baseline activity level will improve Outcome: Progressing   Problem: Cardiac: Goal: Ability to maintain adequate cardiovascular perfusion will improve Outcome: Progressing Goal: Vascular access site(s) Level 0-1 will be maintained Outcome: Progressing   Problem: Health Behavior/ Discharge Planning: Goal: Ability to safely manage health related needs after discharge Outcome: Progressing   Problem: Education: Goal: Understanding of cardiac disease, CV risk reduction, and recovery process will improve Outcome: Progressing Goal: Individualized Educational Video(s) Outcome: Progressing   Problem: Activity: Goal: Ability to tolerate increased activity will improve Outcome: Progressing   Problem: Cardiac: Goal: Ability to achieve and maintain adequate cardiovascular perfusion will improve Outcome: Progressing   Problem: Health Behavior/Discharge Planning: Goal: Ability to safely manage health-related needs after discharge will improve Outcome: Progressing   Problem: Education: Goal: Ability to describe self-care measures that may prevent or decrease complications (Diabetes Survival Skills Education) will improve Outcome: Progressing Goal: Individualized Educational Video(s) Outcome: Progressing   Problem: Coping: Goal: Ability to adjust to condition or change in health will improve Outcome: Progressing   Problem:  Fluid Volume: Goal: Ability to maintain a balanced intake and output will improve Outcome: Progressing   Problem: Health Behavior/Discharge Planning: Goal: Ability to identify and utilize available resources and services will improve Outcome: Progressing Goal: Ability to manage health-related needs will improve Outcome: Progressing   Problem: Metabolic: Goal: Ability to maintain appropriate glucose levels will improve Outcome: Progressing   Problem: Nutritional: Goal: Maintenance of adequate nutrition will improve Outcome: Progressing Goal: Progress toward achieving an optimal weight will improve Outcome: Progressing   Problem: Skin Integrity: Goal: Risk for impaired skin integrity will decrease Outcome: Progressing   Problem: Tissue Perfusion: Goal: Adequacy of tissue perfusion will improve Outcome: Progressing   Problem: Education: Goal: Knowledge of General Education information will improve Description: Including pain rating scale, medication(s)/side effects and non-pharmacologic comfort measures Outcome: Progressing   Problem: Health Behavior/Discharge Planning: Goal: Ability to manage health-related needs will improve Outcome: Progressing   Problem: Clinical Measurements: Goal: Ability to maintain clinical measurements within normal limits will improve Outcome: Progressing Goal: Will remain free from infection Outcome: Progressing Goal: Diagnostic test results will improve Outcome: Progressing Goal: Respiratory complications will improve Outcome: Progressing Goal: Cardiovascular complication will be avoided Outcome: Progressing   Problem: Activity: Goal: Risk for activity intolerance will decrease Outcome: Progressing   Problem: Nutrition: Goal: Adequate nutrition will be maintained Outcome: Progressing   Problem: Coping: Goal: Level of anxiety will decrease Outcome: Progressing   Problem: Elimination: Goal: Will not experience complications related  to bowel motility Outcome: Progressing Goal: Will not experience complications related to urinary retention Outcome: Progressing   Problem: Pain Managment: Goal: General experience of comfort will improve Outcome: Progressing   Problem: Safety: Goal: Ability to remain free from injury will improve Outcome: Progressing   Problem: Skin Integrity: Goal: Risk for impaired skin integrity will decrease Outcome: Progressing   Problem: Education: Goal: Understanding of CV disease, CV risk reduction, and recovery process will improve Outcome: Progressing Goal: Individualized Educational Video(s) Outcome: Progressing   Problem: Activity: Goal: Ability to return to baseline  activity level will improve Outcome: Progressing   Problem: Cardiovascular: Goal: Ability to achieve and maintain adequate cardiovascular perfusion will improve Outcome: Progressing Goal: Vascular access site(s) Level 0-1 will be maintained Outcome: Progressing

## 2023-03-04 NOTE — Progress Notes (Signed)
Patient Name: Emily Farmer Date of Encounter: 03/04/2023 [facility] Cardiologist: Chrystie Nose, MD   Interval Summary  .    Patient continues to have some shortness of breath and orthopnea this AM. Has occasional sharp pain on the left side of her chest that only lasts for a few seconds each time. No chest pain otherwise. Good urine output on lasix   Vital Signs .    Vitals:   03/04/23 0028 03/04/23 0418 03/04/23 0728 03/04/23 0849  BP: 109/62 116/63 113/67   Pulse: 60 60 61 60  Resp: 16 15 16    Temp: 97.6 F (36.4 C) 97.6 F (36.4 C) 98.1 F (36.7 C)   TempSrc: Oral Oral Oral   SpO2:  100% 92%   Weight:      Height:        Intake/Output Summary (Last 24 hours) at 03/04/2023 0901 Last data filed at 03/04/2023 0851 Gross per 24 hour  Intake 2085.19 ml  Output 3800 ml  Net -1714.81 ml      03/01/2023    3:01 PM 03/01/2023    9:24 AM 02/02/2023    8:40 AM  Last 3 Weights  Weight (lbs) 180 lb 12.4 oz 174 lb 171 lb 6.4 oz  Weight (kg) 82 kg 78.926 kg 77.747 kg      Telemetry/ECG    V-paced rhythm, HR in the 60s - Personally Reviewed  Physical Exam .   GEN: No acute distress.  Sitting comfortably in the bed  Neck: No JVD Cardiac: RRR, no murmurs, rubs, or gallops. Right radial cath site is soft, nontender.  Respiratory: Crackles in bilateral lung bases. Normal work of breathing on room air  GI: Soft, nontender, non-distended  MS: No edema in BLE   Assessment & Plan .     CAD  - Patient presented complaining of chest pain and shortness of breath on exertion. Symptoms improved with rest - hsTn 19, 29 - Underwent R/L heart cath yesterday- revealed multivessel disease. Question if she would be a better candidate for CABG vs PCI - On IV heparin  - Continue ASA 81 mg daily, lipitor 40 mg daily, imdur 60 mg daily, metoprolol succinate 100 mg daily   Severe pulmonary Htn  Acute on chronic HFpEF  - R/L heart catheterization revealed findings consistent  with severe pulmonary hypertension  - Echocardiogram showed EF 60-65%, no regional wall motion abnormalities, mildly reduced RV function, severely elevated pulmonary artery systolic pressure, mild MR  - Advanced heart failure failure consulted yesterday- patient now on IV lasix BID  - Output 2.9 L urine yesterday. Renal function stable. Continues to have some shortness of breath and crackles in lung bases  - Continue IV lasix  - Once diuresed, plan to repeat RHC to reassess   AKI on CKD stage II - Follow creatinine with daily BMP  - Creatinine 1.32 today   Type 2 DM  - On ssi while admitted  - SGLT2i on hold as patient has UTI   UTI  - Urinalysis positive for nitrites and bactermia. Urine culture pending   Permanent Afib  History of Tachy-brady syndrome  - s/p AV nod ablation and s/p single chamber medtronic PPM- device was interrogated by Dr. Royann Shivers in the ED and showed normal function, 100% V pacing, no episode of high ventricular rate, lead parameters are excellent  - Eliquis held prior to cath yesterday. Now on IV heparin as we are considering CABG vs PCI   OSA  -  On cpap   For questions or updates, please contact [facility] Please consult [email or web address] for contact info under        Signed, Jonita Albee, PA-C

## 2023-03-04 NOTE — Consult Note (Cosign Needed)
[address]       [place] [address]             [phone]        Emily Farmer [facility] Medical Record #[id number] Date of Birth: 02-22-1956  Referring: No ref. provider found Primary Care: Marcine Matar, MD Primary Cardiologist:Kenneth Alona Bene, MD  Chief Complaint: Severe three-vessel coronary artery disease/exertional chest pain   History of Present Illness:    We are asked to see this 67 year old female in cardiothoracic surgical consultation for consideration of coronary artery surgical revascularization (CABG).  The patient presented to the emergency room on 03/01/2023 with symptoms of chest pain and dyspnea.  She has multiple cardiac risk factors including type 2 diabetes, mixed hyperlipidemia, hypertriglyceridemia, OSA on CPAP, hypertension and atrial fibrillation status post recent ablation and placement of single-chamber PPM.  She reports that since having the pacemaker placed she describes some heaviness in her chest with increasing shortness of breath/DOE.  Simple exercise including such things as walking to the mailbox lead to shortness of breath and chest discomfort.  She was felt to require admission for further diagnostic evaluation including chest CTA and cardiac catheterization.  She has severe hypertriglyceridemia with levels into the 1500s as well as previous history of acute pancreatitis.  She has very poor control of her diabetes with most recent hemoglobin A1c 10.2 but has been greater than 15 with significantly elevated levels dating back to 2013 in epic.  Her previous history of atrial fibrillation has been difficult to control and has undergone AV nodal ablation.  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  Cardiac catheterization revealed severe three-vessel coronary artery disease.  Hemodynamic findings were consistent with severe pulmonary pretension.  The  advanced heart failure team has been consulted to assist with optimization of cardiac function and initiation of GDMT.  Troponins have been mildly elevated.  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  Most recent creatinine is noted to be 1.32.  Previous CT scan showed significant atherosclerosis in the abdominal aorta.  It does not appear the CTA of the chest has been performed yet.  Echocardiogram showed EF of 60 to 65% with no regional wall motion abnormalities, mildly reduced RV function, severely elevated pulmonary artery systolic pressure, mild MR.  Eliquis is currently on hold and she is on a heparin drip.    Current Activity/ Functional Status: Patient was independent with mobility/ambulation, transfers, ADL's, IADL's.   Zubrod Score: At the time of surgery this patient's most appropriate activity status/level should be described as: []     0    Normal activity, no symptoms []     1    Restricted in physical strenuous activity but ambulatory, able to do out light work [x]     2    Ambulatory and capable of self care, unable to do work activities, up and about                 more than 50%  Of the time                            []     3    Only limited self care, in bed greater than 50% of waking hours []     4    Completely disabled, no self care, confined to bed or chair []   5    Moribund  Past Medical History:  Diagnosis Date   Arthritis    Atrial fibrillation (HCC)    a. 09/2016 in setting of pancreatitis;  b. 09/2016 Echo: EF 65-60%, no rwma, Gr2 DD, mild MR, triv TR, PASP ;  CHA2DS2VASc = 4-->Eliquis 5mg  BID. c. Recurred 06/2018 in setting of GIB.   Chronic diastolic CHF (congestive heart failure) (HCC) 01/28/2018   Diabetes mellitus    GI bleeding 06/2018   Gout    Hyperlipidemia    Hypertension    Hypertriglyceridemia    Pancreatitis    a. 09/2016 - Triglycerides 1,392 on admission.    Past Surgical History:  Procedure Laterality Date   AV NODE ABLATION N/A  01/02/2023   Procedure: AV NODE ABLATION;  Surgeon: Marinus Maw, MD;  Location: [facility] INVASIVE CV LAB;  Service: Cardiovascular;  Laterality: N/A;   BIOPSY  06/20/2018   Procedure: BIOPSY;  Surgeon: Vida Rigger, MD;  Location: [facility] ENDOSCOPY;  Service: Endoscopy;;   CHOLECYSTECTOMY N/A 01/04/2014   Procedure: LAPAROSCOPIC CHOLECYSTECTOMY WITH INTRAOPERATIVE CHOLANGIOGRAM;  Surgeon: Axel Filler, MD;  Location: [facility] OR;  Service: General;  Laterality: N/A;   COLONOSCOPY WITH PROPOFOL N/A 06/21/2018   Procedure: COLONOSCOPY WITH PROPOFOL;  Surgeon: Bernette Redbird, MD;  Location: [facility] ENDOSCOPY;  Service: Endoscopy;  Laterality: N/A;   ESOPHAGOGASTRODUODENOSCOPY (EGD) WITH PROPOFOL N/A 06/20/2018   Procedure: ESOPHAGOGASTRODUODENOSCOPY (EGD) WITH PROPOFOL;  Surgeon: Vida Rigger, MD;  Location: [facility] ENDOSCOPY;  Service: Endoscopy;  Laterality: N/A;   LUNG SURGERY     PACEMAKER IMPLANT N/A 03/03/2022   Procedure: PACEMAKER IMPLANT;  Surgeon: Marinus Maw, MD;  Location: [facility] INVASIVE CV LAB;  Service: Cardiovascular;  Laterality: N/A;   RIGHT/LEFT HEART CATH AND CORONARY ANGIOGRAPHY N/A 03/03/2023   Procedure: RIGHT/LEFT HEART CATH AND CORONARY ANGIOGRAPHY;  Surgeon: Marykay Lex, MD;  Location: [facility] INVASIVE CV LAB;  Service: Cardiovascular;  Laterality: N/A;    Social History   Tobacco Use  Smoking Status Never  Smokeless Tobacco Never    Social History   Substance and Sexual Activity  Alcohol Use No     Allergies  Allergen Reactions   Trulicity [Dulaglutide] Diarrhea    Current Facility-Administered Medications  Medication Dose Route Frequency Provider Last Rate Last Admin   0.9 %  sodium chloride infusion  250 mL Intravenous PRN Marykay Lex, MD       acetaminophen (TYLENOL) tablet 650 mg  650 mg Oral Q4H PRN Marykay Lex, MD   650 mg at 03/03/23 2258   aspirin EC tablet 81 mg  81 mg Oral Daily Marykay Lex, MD   81 mg at 03/04/23 0849   atorvastatin (LIPITOR) tablet 40 mg   40 mg Oral Daily Marykay Lex, MD   40 mg at 03/04/23 0849   fenofibrate tablet 160 mg  160 mg Oral Daily Marykay Lex, MD   160 mg at 03/04/23 0848   ferrous sulfate tablet 325 mg  325 mg Oral Q breakfast Marykay Lex, MD   325 mg at 03/04/23 0849   furosemide (LASIX) injection 80 mg  80 mg Intravenous BID Brynda Peon L, NP   80 mg at 03/04/23 0846   heparin ADULT infusion 100 units/mL (25000 units/261mL)  1,150 Units/hr Intravenous Continuous Silvana Newness, RPH 11.5 mL/hr at 03/04/23 1610 1,150 Units/hr at 03/04/23 9604   icosapent Ethyl (VASCEPA) 1 g capsule 2 g  2 g Oral BID Marykay Lex, MD  2 g at 03/04/23 0848   influenza vaccine adjuvanted (FLUAD) injection 0.5 mL  0.5 mL Intramuscular Tomorrow-1000 Marykay Lex, MD       insulin aspart (novoLOG) injection 0-15 Units  0-15 Units Subcutaneous TID WC Marykay Lex, MD   3 Units at 03/02/23 1311   insulin aspart (novoLOG) injection 10 Units  10 Units Subcutaneous TID WC Marykay Lex, MD   10 Units at 03/04/23 0701   insulin glargine-yfgn (SEMGLEE) injection 20 Units  20 Units Subcutaneous QHS Marykay Lex, MD   20 Units at 03/03/23 2318   isosorbide mononitrate (IMDUR) 24 hr tablet 60 mg  60 mg Oral Daily Marykay Lex, MD   60 mg at 03/04/23 0849   metoprolol succinate (TOPROL-XL) 24 hr tablet 100 mg  100 mg Oral Daily Marykay Lex, MD   100 mg at 03/04/23 0849   nitroGLYCERIN (NITROSTAT) SL tablet 0.4 mg  0.4 mg Sublingual Q5 Min x 3 PRN Marykay Lex, MD   0.4 mg at 03/03/23 1234   ondansetron (ZOFRAN) injection 4 mg  4 mg Intravenous Q6H PRN Marykay Lex, MD       sodium chloride flush (NS) 0.9 % injection 3 mL  3 mL Intravenous Q12H Marykay Lex, MD   3 mL at 03/04/23 0851   sodium chloride flush (NS) 0.9 % injection 3 mL  3 mL Intravenous PRN Marykay Lex, MD        Medications Prior to Admission  Medication Sig Dispense Refill Last Dose   acetaminophen (TYLENOL) 325 MG tablet Take  2 tablets (650 mg total) by mouth every 6 (six) hours as needed for mild pain (or Fever >/= 101).   03/01/2023   apixaban (ELIQUIS) 5 MG TABS tablet Take 1 tablet (5 mg total) by mouth in the morning and at bedtime. 180 tablet 1 02/28/2023 at 2200   atorvastatin (LIPITOR) 40 MG tablet Take 1 tablet (40 mg total) by mouth daily. 90 tablet 3 02/28/2023   ciclopirox (PENLAC) 8 % solution Apply topically at bedtime. Apply over nail and surrounding skin. Apply daily over previous coat. After seven (7) days, may remove with alcohol and continue cycle. 6.6 mL 0 02/28/2023   diclofenac Sodium (VOLTAREN) 1 % GEL Apply 2 g topically 4 (four) times daily. Apply across lower back for back pain (Patient taking differently: Apply 2 g topically 4 (four) times daily as needed (pain). Apply across lower back for back pain) 100 g 0 02/28/2023   diltiazem (CARDIZEM CD) 120 MG 24 hr capsule Take 1 capsule (120 mg total) by mouth daily. 60 capsule 5 02/28/2023   fenofibrate (TRICOR) 48 MG tablet Take 1 tablet (48 mg total) by mouth daily. 90 tablet 1 02/28/2023   ferrous sulfate 325 (65 FE) MG tablet Take 1 tablet (325 mg total) by mouth daily with breakfast. 30 tablet 4 02/28/2023   insulin glargine (LANTUS SOLOSTAR) 100 UNIT/ML Solostar Pen Inject 55 Units into the skin daily. 45 mL 3 02/28/2023   insulin lispro (HUMALOG KWIKPEN) 100 UNIT/ML KwikPen Max daily 45 units (Patient taking differently: Inject 12 Units into the skin with breakfast, with lunch, and with evening meal.) 45 mL 2 02/28/2023   liraglutide (VICTOZA) 18 MG/3ML SOPN Inject 1.8 mg into the skin daily. 27 mL 3 02/28/2023   metoprolol succinate (TOPROL-XL) 100 MG 24 hr tablet Take 1 tablet (100 mg total) by mouth daily. Take with or immediately following a meal. 90 tablet  6 02/28/2023 at 2200   Multiple Vitamins-Minerals (CENTRUM SILVER PO) Take 1 tablet by mouth daily.   02/28/2023   Multiple Vitamins-Minerals (OCUVITE ADULT 50+) CAPS Take 1 capsule by mouth daily.    02/28/2023   pioglitazone (ACTOS) 15 MG tablet Take 1 tablet (15 mg total) by mouth daily. 90 tablet 3 02/28/2023   Accu-Chek Softclix Lancets lancets Use to check blood sugar 3 times daily. 100 each 3    Blood Glucose Monitoring Suppl (ACCU-CHEK GUIDE) w/Device KIT Use to check blood sugar 3 times daily. 1 kit 0    Blood Pressure Monitoring (BLOOD PRESSURE MONITOR/ARM) DEVI Check blood pressure once per day at least 2 hours after medications. 1 each 0    Continuous Blood Gluc Sensor (DEXCOM G7 SENSOR) MISC 1 Device by Does not apply route as directed. 9 each 3    empagliflozin (JARDIANCE) 25 MG TABS tablet Take 1 tablet (25 mg total) by mouth daily before breakfast. (Patient not taking: Reported on 03/01/2023) 90 tablet 1 Not Taking   glucose blood (ACCU-CHEK GUIDE) test strip Use to check blood sugar 3 times daily. 100 each 2    Insulin Pen Needle 32G X 4 MM MISC use in the morning, at noon, in the evening, and at bedtime. 400 each 3    Insulin Syringe-Needle U-100 (TRUEPLUS INSULIN SYRINGE) 31G X 5/16" 0.3 ML MISC Use to inject insulin daily. (Patient taking differently: 1 each by Other route See admin instructions. Use to inject insulin daily.) 100 each 11     Family History  Problem Relation Age of Onset   Heart attack Mother    Heart attack Father    Diabetes Sister    High blood pressure Neg Hx    High Cholesterol Neg Hx      Review of Systems:   Review of Systems  Constitutional:  Positive for malaise/fatigue.  HENT:  Positive for congestion.   Eyes:        Reports  h/o glaucoma- no current tx  Respiratory:  Positive for cough and shortness of breath.   Cardiovascular:  Positive for chest pain.  Gastrointestinal:  Positive for heartburn.  Musculoskeletal:  Positive for back pain.  Neurological:  Positive for dizziness and weakness.        Physical Exam: BP 113/67 (BP Location: Left Arm)   Pulse 60   Temp 98.1 F (36.7 C) (Oral)   Resp 16   Ht 5\' 3"  (1.6 m)   Wt 82 kg    SpO2 92%   BMI 32.02 kg/m    General appearance: alert, cooperative, and no distress Head: Normocephalic, without obvious abnormality, atraumatic Neck: no adenopathy, no carotid bruit, no JVD, supple, symmetrical, trachea midline, and thyroid not enlarged, symmetric, no tenderness/mass/nodules Lymph nodes: Cervical, supraclavicular, and axillary nodes normal. Resp: dim right base Back: symmetric, no curvature. ROM normal. No CVA tenderness. Cardio: regular rate and rhythm, S1, S2 normal, no murmur, click, rub or gallop GI: soft, non-tender; bowel sounds normal; no masses,  no organomegaly Extremities: edema ankle- minor and palp pulses Neurologic: Grossly normal  Diagnostic Studies & Laboratory data:     Recent Radiology Findings:   US RENAL  Result Date: 03/03/2023 CLINICAL DATA:  Acute kidney injury EXAM: RENAL / URINARY TRACT ULTRASOUND COMPLETE COMPARISON:  Ultrasound 04/01/2021.  Older CT scans FINDINGS: Right Kidney: Renal measurements: 11.5 x 5.3 x 5.8 cm = volume: 187.8 mL. No collecting system dilatation. Preserved echogenicity. Trace perinephric fluid. Left Kidney: Renal measurements: 11.1 x  5.4 x 5.6 cm = volume: 176.4 mL. Echogenicity within normal limits. No mass or hydronephrosis visualized. Bladder: Distended urinary bladder. Other: Some limitation with overlapping bowel gas and soft tissue. IMPRESSION: No collecting system dilatation. Right-sided nonspecific perinephric fluid Electronically Signed   By: Karen Kays M.D.   On: 03/03/2023 20:33   CARDIAC CATHETERIZATION  Result Date: 03/03/2023   Prox RCA lesion is 99% stenosed. Subtotal Occlusion - TIMI 2 flow   Mid RCA lesion is 80% stenosed.  Mid RCA to Dist RCA lesion is 90% stenosed. Dist RCA lesion is 70% stenosed.   Mid Cx lesion is 80% stenosed prior to distal OM-LPL   Mid LAD lesion is 50% stenosed. Dist LAD lesion is 90% stenosed.   2nd Diag lesion is 70% stenosed.   Hemodynamic findings consistent with severe  pulmonary hypertension.   There is no aortic valve stenosis. SUMMARY Multivessel disease-question if she would be a better candidate for CABG versus multivessel PCI given concerns for renal insufficiency and elevated PAP. RECOMMENDATIONS Patient has multivessel disease, I will start her back on IV heparin 8 hours after sheath removal pending assessment for potential multivessel PCI versus CABG. Gentle hydration with diuresis today, have consulted advanced heart failure team for assistance in management and TUNE-UP prior to consideration of CVTS consultation versus multivessel PCI. Case discussed with Drs. Janne Napoleon, Marca Ancona as well as the patient's sister [name]. Bryan Lemma, MD  ECHOCARDIOGRAM COMPLETE  Result Date: 03/02/2023    ECHOCARDIOGRAM REPORT   Patient Name:   Emily Farmer Date of Exam: 03/02/2023 Medical Rec #:  [id number]     Height:       63.0 in Accession #:    [id number]    Weight:       180.8 lb Date of Birth:  1956-01-02    BSA:          1.852 m Patient Age:    66 years      BP:           122/69 mmHg Patient Gender: F             HR:           68 bpm. Exam Location:  Inpatient Procedure: 2D Echo, Cardiac Doppler and Color Doppler Indications:    Chest Pain  History:        Patient has prior history of Echocardiogram examinations, most                 recent 03/03/2022. CHF, Pacemaker, Arrythmias:Atrial                 Fibrillation; Risk Factors:Dyslipidemia and Hypertension.  Sonographer:    Melton Krebs RDCS, FE, PE Referring Phys: [id number] Cyndi Bender IMPRESSIONS  1. Left ventricular ejection fraction, by estimation, is 60 to 65%. The left ventricle has normal function. The left ventricle has no regional wall motion abnormalities. Left ventricular diastolic parameters are indeterminate. Elevated left atrial pressure. There is the interventricular septum is flattened in systole and diastole, consistent with right ventricular pressure and volume overload.  2. Right ventricular  systolic function is mildly reduced. The right ventricular size is moderately enlarged. There is severely elevated pulmonary artery systolic pressure.  3. Right atrial size was moderately dilated.  4. The mitral valve is normal in structure. Mild mitral valve regurgitation. No evidence of mitral stenosis.  5. Tricuspid valve regurgitation is moderate.  6. The aortic valve is calcified. Aortic valve regurgitation is not visualized. Aortic valve  sclerosis/calcification is present, without any evidence of aortic stenosis.  7. The inferior vena cava is dilated in size with <50% respiratory variability, suggesting right atrial pressure of 15 mmHg. FINDINGS  Left Ventricle: Left ventricular ejection fraction, by estimation, is 60 to 65%. The left ventricle has normal function. The left ventricle has no regional wall motion abnormalities. The left ventricular internal cavity size was normal in size. There is  no left ventricular hypertrophy. The interventricular septum is flattened in systole and diastole, consistent with right ventricular pressure and volume overload. Left ventricular diastolic parameters are indeterminate. Elevated left atrial pressure. Right Ventricle: The right ventricular size is moderately enlarged. Right ventricular systolic function is mildly reduced. There is severely elevated pulmonary artery systolic pressure. The tricuspid regurgitant velocity is 4.19 m/s, and with an assumed right atrial pressure of 15 mmHg, the estimated right ventricular systolic pressure is 85.2 mmHg. Left Atrium: Left atrial size was normal in size. Right Atrium: Right atrial size was moderately dilated. Pericardium: There is no evidence of pericardial effusion. Mitral Valve: The mitral valve is normal in structure. Mild mitral valve regurgitation. No evidence of mitral valve stenosis. Tricuspid Valve: The tricuspid valve is normal in structure. Tricuspid valve regurgitation is moderate . No evidence of tricuspid stenosis.  Aortic Valve: The aortic valve is calcified. Aortic valve regurgitation is not visualized. Aortic valve sclerosis/calcification is present, without any evidence of aortic stenosis. Aortic valve mean gradient measures 5.0 mmHg. Aortic valve peak gradient measures 8.8 mmHg. Aortic valve area, by VTI measures 2.02 cm. Pulmonic Valve: The pulmonic valve was normal in structure. Pulmonic valve regurgitation is not visualized. No evidence of pulmonic stenosis. Aorta: The aortic root is normal in size and structure. Venous: The inferior vena cava is dilated in size with less than 50% respiratory variability, suggesting right atrial pressure of 15 mmHg. IAS/Shunts: No atrial level shunt detected by color flow Doppler. Additional Comments: A device lead is visualized.  LEFT VENTRICLE PLAX 2D LVIDd:         4.10 cm   Diastology LVIDs:         2.60 cm   LV e' medial:    5.77 cm/s LV PW:         1.00 cm   LV E/e' medial:  20.6 LV IVS:        1.00 cm   LV e' lateral:   5.55 cm/s LVOT diam:     1.80 cm   LV E/e' lateral: 21.4 LV SV:         60 LV SV Index:   33 LVOT Area:     2.54 cm  RIGHT VENTRICLE RV S prime:     8.84 cm/s TAPSE (M-mode): 1.0 cm LEFT ATRIUM             Index        RIGHT ATRIUM           Index LA diam:        4.00 cm 2.16 cm/m   RA Area:     16.40 cm LA Vol (A2C):   51.7 ml 27.91 ml/m  RA Volume:   40.90 ml  22.08 ml/m LA Vol (A4C):   70.8 ml 38.22 ml/m LA Biplane Vol: 65.2 ml 35.20 ml/m  AORTIC VALVE AV Area (Vmax):    2.06 cm AV Area (Vmean):   1.99 cm AV Area (VTI):     2.02 cm AV Vmax:           148.00  cm/s AV Vmean:          103.000 cm/s AV VTI:            0.299 m AV Peak Grad:      8.8 mmHg AV Mean Grad:      5.0 mmHg LVOT Vmax:         120.00 cm/s LVOT Vmean:        80.700 cm/s LVOT VTI:          0.237 m LVOT/AV VTI ratio: 0.79  AORTA Ao Root diam: 2.80 cm MITRAL VALVE                TRICUSPID VALVE MV Area (PHT): 3.61 cm     TR Peak grad:   70.2 mmHg MV Decel Time: 210 msec     TR Vmax:         419.00 cm/s MV E velocity: 119.00 cm/s MV A velocity: 30.90 cm/s   SHUNTS MV E/A ratio:  3.85         Systemic VTI:  0.24 m                             Systemic Diam: 1.80 cm Olga Millers MD Electronically signed by Olga Millers MD Signature Date/Time: 03/02/2023/3:15:02 PM    Final      I have independently reviewed the above radiologic studies and discussed with the patient   Recent Lab Findings: Lab Results  Component Value Date   WBC 6.1 03/03/2023   HGB 10.5 (L) 03/03/2023   HGB 10.9 (L) 03/03/2023   HCT 31.0 (L) 03/03/2023   HCT 32.0 (L) 03/03/2023   PLT 165 03/03/2023   GLUCOSE 107 (H) 03/04/2023   CHOL 95 03/02/2023   TRIG 121 03/02/2023   HDL 39 (L) 03/02/2023   LDLDIRECT 137 (H) 03/01/2020   LDLCALC 32 03/02/2023   ALT 16 11/18/2022   AST 14 11/18/2022   NA 136 03/04/2023   K 3.8 03/04/2023   CL 105 03/04/2023   CREATININE 1.32 (H) 03/04/2023   BUN 38 (H) 03/04/2023   CO2 20 (L) 03/04/2023   TSH 2.521 03/01/2022   INR 1.1 03/30/2021   HGBA1C 7.8 (A) 01/13/2023      Assessment / Plan:   Severe three-vessel coronary artery disease, mild elevation troponin Pulmonary hypertension Acute on chronic diastolic heart failure HFpEF Stage II CKD Permanent atrial fibrillation status post AV node ablation and pacemaker Ruled in for UTI this admission, cultures pending OSA with CPAP at night Gout Arthritis DM Hypertension Hyperlipidemia/hypertriglyceridemia-mixed History of pancreatitis  The surgeon will review the patient and relevant studies to determine timing and feasibility of proceeding with coronary artery surgical revascularization.  I  spent 30 minutes counseling the patient face to face.   Rowe Clack, PA-C  03/04/2023 10:55 AM

## 2023-03-04 NOTE — Progress Notes (Addendum)
Advanced Heart Failure Rounding Note  PCP-Cardiologist: Chrystie Nose, MD   Subjective:   Cath with multivessel disease, PVR 2.9, and  elevated filling pressures.   Started on IV lasix. I/O not accurate.   Denies SOB. Denies chest pain    Objective:   Weight Range: 82 kg Body mass index is 32.02 kg/m.   Vital Signs:   Temp:  [97.5 F (36.4 C)-98.1 F (36.7 C)] 97.8 F (36.6 C) (09/18 1133) Pulse Rate:  [60-69] 60 (09/18 1133) Resp:  [15-27] 16 (09/18 1133) BP: (101-135)/(54-90) 106/54 (09/18 1133) SpO2:  [85 %-100 %] 93 % (09/18 1133) Last BM Date : 03/03/23  Weight change: Filed Weights   03/01/23 0924 03/01/23 1501  Weight: 78.9 kg 82 kg    Intake/Output:   Intake/Output Summary (Last 24 hours) at 03/04/2023 1252 Last data filed at 03/04/2023 0851 Gross per 24 hour  Intake 2085.19 ml  Output 3300 ml  Net -1214.81 ml      Physical Exam    General:  . No resp difficulty HEENT: Normal Neck: Supple. JVP 10-11. Carotids 2+ bilat; no bruits. No lymphadenopathy or thyromegaly appreciated. Cor: PMI nondisplaced. Regular rate & rhythm. No rubs, gallops or murmurs. Lungs: Clear Abdomen: Soft, nontender, nondistended. No hepatosplenomegaly. No bruits or masses. Good bowel sounds. Extremities: No cyanosis, clubbing, rash, trace edema Neuro: Alert & orientedx3, cranial nerves grossly intact. moves all 4 extremities w/o difficulty. Affect pleasant   Telemetry  SR   EKG    N/A  Labs    CBC Recent Labs    03/03/23 1302 03/03/23 1505 03/03/23 1511  WBC 6.1  --   --   NEUTROABS 3.7  --   --   HGB 9.5* 10.5* 10.9*  10.5*  HCT 30.6* 31.0* 32.0*  31.0*  MCV 90.3  --   --   PLT 165  --   --    Basic Metabolic Panel Recent Labs    16/10/96 0434 03/03/23 1505 03/03/23 1511 03/04/23 0644  NA 137   < > 141  141 136  K 4.3   < > 3.9  3.9 3.8  CL 108  --   --  105  CO2 19*  --   --  20*  GLUCOSE 150*  --   --  107*  BUN 47*  --   --  38*   CREATININE 1.49*  --   --  1.32*  CALCIUM 8.6*  --   --  8.6*   < > = values in this interval not displayed.   Liver Function Tests No results for input(s): "AST", "ALT", "ALKPHOS", "BILITOT", "PROT", "ALBUMIN" in the last 72 hours. No results for input(s): "LIPASE", "AMYLASE" in the last 72 hours. Cardiac Enzymes No results for input(s): "CKTOTAL", "CKMB", "CKMBINDEX", "TROPONINI" in the last 72 hours.  BNP: BNP (last 3 results) No results for input(s): "BNP" in the last 8760 hours.  ProBNP (last 3 results) No results for input(s): "PROBNP" in the last 8760 hours.   D-Dimer No results for input(s): "DDIMER" in the last 72 hours. Hemoglobin A1C No results for input(s): "HGBA1C" in the last 72 hours. Fasting Lipid Panel Recent Labs    03/02/23 0422  CHOL 95  HDL 39*  LDLCALC 32  TRIG 045  CHOLHDL 2.4   Thyroid Function Tests No results for input(s): "TSH", "T4TOTAL", "T3FREE", "THYROIDAB" in the last 72 hours.  Invalid input(s): "FREET3"  Other results:   Imaging    US RENAL  Result Date: 03/03/2023 CLINICAL DATA:  Acute kidney injury EXAM: RENAL / URINARY TRACT ULTRASOUND COMPLETE COMPARISON:  Ultrasound 04/01/2021.  Older CT scans FINDINGS: Right Kidney: Renal measurements: 11.5 x 5.3 x 5.8 cm = volume: 187.8 mL. No collecting system dilatation. Preserved echogenicity. Trace perinephric fluid. Left Kidney: Renal measurements: 11.1 x 5.4 x 5.6 cm = volume: 176.4 mL. Echogenicity within normal limits. No mass or hydronephrosis visualized. Bladder: Distended urinary bladder. Other: Some limitation with overlapping bowel gas and soft tissue. IMPRESSION: No collecting system dilatation. Right-sided nonspecific perinephric fluid Electronically Signed   By: Karen Kays M.D.   On: 03/03/2023 20:33   CARDIAC CATHETERIZATION  Result Date: 03/03/2023   Prox RCA lesion is 99% stenosed. Subtotal Occlusion - TIMI 2 flow   Mid RCA lesion is 80% stenosed.  Mid RCA to Dist RCA  lesion is 90% stenosed. Dist RCA lesion is 70% stenosed.   Mid Cx lesion is 80% stenosed prior to distal OM-LPL   Mid LAD lesion is 50% stenosed. Dist LAD lesion is 90% stenosed.   2nd Diag lesion is 70% stenosed.   Hemodynamic findings consistent with severe pulmonary hypertension.   There is no aortic valve stenosis. SUMMARY Multivessel disease-question if she would be a better candidate for CABG versus multivessel PCI given concerns for renal insufficiency and elevated PAP. RECOMMENDATIONS Patient has multivessel disease, I will start her back on IV heparin 8 hours after sheath removal pending assessment for potential multivessel PCI versus CABG. Gentle hydration with diuresis today, have consulted advanced heart failure team for assistance in management and TUNE-UP prior to consideration of CVTS consultation versus multivessel PCI. Case discussed with Drs. Janne Napoleon, Marca Ancona as well as the patient's sister [name]. Bryan Lemma, MD    Medications:     Scheduled Medications:  aspirin EC  81 mg Oral Daily   atorvastatin  40 mg Oral Daily   fenofibrate  160 mg Oral Daily   ferrous sulfate  325 mg Oral Q breakfast   furosemide  80 mg Intravenous BID   influenza vaccine adjuvanted  0.5 mL Intramuscular Tomorrow-1000   insulin aspart  0-15 Units Subcutaneous TID WC   insulin aspart  10 Units Subcutaneous TID WC   insulin glargine-yfgn  20 Units Subcutaneous QHS   isosorbide mononitrate  60 mg Oral Daily   metoprolol succinate  100 mg Oral Daily   sodium chloride flush  3 mL Intravenous Q12H    Infusions:  sodium chloride     heparin 1,150 Units/hr (03/04/23 0837)    PRN Medications: sodium chloride, acetaminophen, nitroGLYCERIN, ondansetron (ZOFRAN) IV, sodium chloride flush    Patient Profile   Ms Doucett is a 67 year old with a history of DMII, HLD, CKD Stage IIIa, permanent  A Fib, 01/02/2023 AV node ablation  and single chamber PPM.    Admitted with chest pain. Cath  showed RA 19, PCWP 36, elevated PA pressures, PVR 2.9, CO 4.7, CI 2.7.   Assessment/Plan  1. Chest Pain HS Trop 19>29  LHC- Multivessel disease.   Prox RCA lesion is 99% stenosed. Subtotal Occlusion - TIMI 2 flow   Mid RCA lesion is 80% stenosed.  Mid RCA to Dist RCA lesion is 90% stenosed. Dist RCA lesion is 70% stenosed.   Mid Cx lesion is 80% stenosed prior to distal OM-LPL   Mid LAD lesion is 50% stenosed. Dist LAD lesion is 90% stenosed.   2nd Diag lesion is 70% stenosed. - On aspirin, atorvastatin, Toprol XL,  and imdur .  -Final plan for intervention with PCI versus CABG pending.  -CT surgery consulted  - No chest pain.    2. A/C HFpEF -Echo 60-65%  -Elevated filling pressures, RA 19, PCWP 36, CO 4.7, CI 2.6  -Needs additional diuresis. Continue 80 mg IV lasix twice a day.  - Once diuresed can repeat cath to reassess hemodynamics.  - Holding SGLT2i with UTI.   -Add 12.5 mg spiro daily.    3. Pulmonary Venous HTN  - Known OSA.  -Elevated filling pressures. PVR 2.9  -Needs diuresis. Repeat cath after diuresed.    4. DMII -Hgb A1C pending.  -On SSI  - Hold SGLT2i with UTI.    5. Permanent A fib S/P AV Node ablation 12/2022.    Off diltiazem   6. UTI + UA  -Urine culture pending.  -Start Rocephin.    7. AKI on CKD Stage II Creatinine 1.2>1.5 >1.3    8. OSA Continue CPAP   9.Obesity. Body mass index is 32.02 kg/m.  Length of Stay: 0  Tonye Becket, NP  03/04/2023, 12:52 PM  Advanced Heart Failure Team Pager [phone] (M-F; 7a - 5p)  Please contact [facility] for night-coverage after hours (5p -7a ) and weekends on [email or web address]  Patient seen with NP, agree with the above note.   Breathing is getting better.  Has responded to Lasix but not as much today.  No weight today.  Creatinine trending down.   General: NAD Neck: JVP 14 cm, no thyromegaly or thyroid nodule.  Lungs: Clear to auscultation bilaterally with normal respiratory effort. CV: Nondisplaced  PMI.  Heart regular S1/S2, no S3/S4, no murmur.  No peripheral edema.   Abdomen: Soft, nontender, no hepatosplenomegaly, no distention.  Skin: Intact without lesions or rashes.  Neurologic: Alert and oriented x 3.  Psych: Normal affect. Extremities: No clubbing or cyanosis.  HEENT: Normal.   1. Acute on chronic diastolic CHF with prominent RV failure: Echo this admission with EF 60-65% with D-shaped interventricular septum, moderately dilated RV with mild RV dysfunction, mild MR, moderate TR, dilated IVC.  RHC showed mean RA 20, PA 82/28 mean 50, mean PCWP 36 with v-waves to 50, CI 2.57, PVR 2.9 WU => elevated right and left heart filling pressures with severe primarily pulmonary venous hypertension.  She was started on IV lasix, has had some diuresis but still volume overloaded.  - Lasix 80 mg IV bid, will give dose of metolazone 2.5 x 1 today.  - Hold off on SGLT2 inhibitor with UTI.  - Add spironolactone 12.5 mg daily.   - Will likely need repeat RHC prior to possible CABG to ensure she is hemodynamically optimized.  2. CAD: Unstable angina at presentation, severe 3VD on cath. Diffuse RCA disease with subtotal occlusion and collaterals to PDA from left, 90% mid LAD stenosis, 80% mid LCx stenosis.  She is a diabetic, and I think that her best option would be total revascularization via CABG.  However, will need hemodynamic optimization prior.  - Continue ASA 81 - Continue statin - Consulted TCTS - Can continue current Toprol XL and Imdur. 3.  Pulmonary hypertension: Severe pulmonary venous hypertension with PVR only 2.9 WU.  OSA may contribute.  Treatment will be aggressive diuresis.  4. OSA: Continue CPAP.  5. Atrial fibrillation: Permanent.  S/p AV nodal ablation with PPM (has left bundle lead).  Paced QRS is narrow due to LB lead.   - Stopped diltiazem CD, not needed for rate control given  AV nodal ablation.  - Continue heparin gtt..  6. AKI on CKD stage 3: Creatinine  higher than  baseline, 1.49 => 1.32 today.  Suspect baseline diabetic nephropathy. Follow carefully with diuresis.  7. Type 2 diabetes: SSI for now.   Marca Ancona 03/04/2023 4:16 PM

## 2023-03-04 NOTE — Progress Notes (Signed)
ANTICOAGULATION CONSULT NOTE   Pharmacy Consult for Heparin  Indication:  ACS/STEMI; afib on Eliquis  Allergies  Allergen Reactions   Trulicity [Dulaglutide] Diarrhea    Patient Measurements: Height: 5\' 3"  (160 cm) Weight: 77.1 kg (170 lb) IBW/kg (Calculated) : 52.4 Heparin Dosing Weight: 70.5 kg  Vital Signs: Temp: 97.9 F (36.6 C) (09/18 1614) Temp Source: Oral (09/18 1614) BP: 95/64 (09/18 1718) Pulse Rate: 62 (09/18 1614)  Labs: Recent Labs    03/02/23 0422 03/03/23 0434 03/03/23 1302 03/03/23 1302 03/03/23 1505 03/03/23 1511 03/04/23 0644 03/04/23 1827  HGB  --   --  9.5*   < > 10.5* 10.9*  10.5*  --   --   HCT  --   --  30.6*  --  31.0* 32.0*  31.0*  --   --   PLT  --   --  165  --   --   --   --   --   APTT  --   --   --   --   --   --  54* 58*  HEPARINUNFRC  --   --   --   --   --   --  >1.10*  --   CREATININE 1.47* 1.49*  --   --   --   --  1.32*  --    < > = values in this interval not displayed.    Estimated Creatinine Clearance: 41.8 mL/min (A) (by C-G formula based on SCr of 1.32 mg/dL (H)).   Medical History: Past Medical History:  Diagnosis Date   Arthritis    Atrial fibrillation (HCC)    a. 09/2016 in setting of pancreatitis;  b. 09/2016 Echo: EF 65-60%, no rwma, Gr2 DD, mild MR, triv TR, PASP ;  CHA2DS2VASc = 4-->Eliquis 5mg  BID. c. Recurred 06/2018 in setting of GIB.   Chronic diastolic CHF (congestive heart failure) (HCC) 01/28/2018   Diabetes mellitus    GI bleeding 06/2018   Gout    Hyperlipidemia    Hypertension    Hypertriglyceridemia    Pancreatitis    a. 09/2016 - Triglycerides 1,392 on admission.    Medications:  Scheduled:   aspirin EC  81 mg Oral Daily   atorvastatin  40 mg Oral Daily   fenofibrate  160 mg Oral Daily   ferrous sulfate  325 mg Oral Q breakfast   furosemide  80 mg Intravenous BID   influenza vaccine adjuvanted  0.5 mL Intramuscular Tomorrow-1000   insulin aspart  0-15 Units Subcutaneous TID WC    insulin aspart  10 Units Subcutaneous TID WC   insulin glargine-yfgn  20 Units Subcutaneous QHS   isosorbide mononitrate  60 mg Oral Daily   metoprolol succinate  100 mg Oral Daily   sodium chloride flush  3 mL Intravenous Q12H   spironolactone  12.5 mg Oral Daily   Infusions:   sodium chloride     cefTRIAXone (ROCEPHIN)  IV 1 g (03/04/23 1440)   heparin 1,150 Units/hr (03/04/23 0837)   PRN: sodium chloride, acetaminophen, nitroGLYCERIN, ondansetron (ZOFRAN) IV, sodium chloride flush  Assessment: 67 yo female on chronic Eliquis for afib. 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 Pharmacy consulted to start IV heparin while undergoing assessment for potential multivessel PCI versus CABG. Since on Eliquis, will utilize aPTT for monitoring heparin  -aPTT= 58 on 1150 units/hr. No bleeding or issues with the infusion reported.  Goal of Therapy:  Heparin level 0.3-0.7 units/ml aPTT 66-102 sec Monitor  platelets by anticoagulation protocol: Yes   Plan:  -Increase heparin to 1250 units/hr -aPTT in 8 hrs  Loralee Pacas, PharmD, BCPS 03/04/2023 8:00 PM  Please check AMION for all [facility] phone numbers After 10:00 PM, call Main Pharmacy [phone]

## 2023-03-05 DIAGNOSIS — I270 Primary pulmonary hypertension: Secondary | ICD-10-CM | POA: Diagnosis not present

## 2023-03-05 DIAGNOSIS — I25110 Atherosclerotic heart disease of native coronary artery with unstable angina pectoris: Secondary | ICD-10-CM

## 2023-03-05 DIAGNOSIS — I5033 Acute on chronic diastolic (congestive) heart failure: Secondary | ICD-10-CM | POA: Diagnosis not present

## 2023-03-05 LAB — GLUCOSE, CAPILLARY
Glucose-Capillary: 163 mg/dL — ABNORMAL HIGH (ref 70–99)
Glucose-Capillary: 93 mg/dL (ref 70–99)
Glucose-Capillary: 54 mg/dL — ABNORMAL LOW (ref 70–99)
Glucose-Capillary: 72 mg/dL (ref 70–99)
Glucose-Capillary: 204 mg/dL — ABNORMAL HIGH (ref 70–99)

## 2023-03-05 LAB — BASIC METABOLIC PANEL
Anion gap: 12 (ref 5–15)
BUN: 48 mg/dL — ABNORMAL HIGH (ref 8–23)
CO2: 25 mmol/L (ref 22–32)
Calcium: 8.7 mg/dL — ABNORMAL LOW (ref 8.9–10.3)
Chloride: 96 mmol/L — ABNORMAL LOW (ref 98–111)
Creatinine, Ser: 1.59 mg/dL — ABNORMAL HIGH (ref 0.44–1.00)
GFR, Estimated: 36 mL/min — ABNORMAL LOW (ref >60)
Glucose, Bld: 146 mg/dL — ABNORMAL HIGH (ref 70–99)
Potassium: 3.7 mmol/L (ref 3.5–5.1)
Sodium: 133 mmol/L — ABNORMAL LOW (ref 135–145)

## 2023-03-05 LAB — IRON AND TIBC
Iron: 28 ug/dL (ref 28–170)
Saturation Ratios: 5 % — ABNORMAL LOW (ref 10.4–31.8)
TIBC: 553 ug/dL — ABNORMAL HIGH (ref 250–450)
UIBC: 525 ug/dL

## 2023-03-05 LAB — APTT
aPTT: 119 seconds — ABNORMAL HIGH (ref 24–36)
aPTT: 89 seconds — ABNORMAL HIGH (ref 24–36)

## 2023-03-05 LAB — RETICULOCYTES
Immature Retic Fract: 28.6 % — ABNORMAL HIGH (ref 2.3–15.9)
RBC.: 3.69 MIL/uL — ABNORMAL LOW (ref 3.87–5.11)
Retic Count, Absolute: 114 10*3/uL (ref 19.0–186.0)
Retic Ct Pct: 3.1 % (ref 0.4–3.1)

## 2023-03-05 LAB — FOLATE: Folate: >40 ng/mL (ref >5.9)

## 2023-03-05 LAB — FERRITIN: Ferritin: 33 ng/mL (ref 11–307)

## 2023-03-05 LAB — HEPARIN LEVEL (UNFRACTIONATED): Heparin Unfractionated: >1.1 IU/mL — ABNORMAL HIGH (ref 0.30–0.70)

## 2023-03-05 LAB — VITAMIN B12: Vitamin B-12: 656 pg/mL (ref 180–914)

## 2023-03-05 MED ORDER — ASPIRIN 81 MG PO CHEW
81.0000 mg | CHEWABLE_TABLET | ORAL | Status: AC
  Administered 2023-03-06: 81 mg via ORAL
  Filled 2023-03-05: qty 1

## 2023-03-05 MED ORDER — INSULIN ASPART 100 UNIT/ML IJ SOLN
7.0000 [IU] | Freq: Three times a day (TID) | INTRAMUSCULAR | Status: DC
  Administered 2023-03-06 – 2023-03-08 (×6): 7 [IU] via SUBCUTANEOUS

## 2023-03-05 MED ORDER — SODIUM CHLORIDE 0.9 % IV SOLN: INTRAVENOUS | Status: DC

## 2023-03-05 NOTE — Progress Notes (Addendum)
Advanced Heart Failure Rounding Note  PCP-Cardiologist: Chrystie Nose, MD   Subjective:   Cath with multivessel disease, PVR 2.9, and  elevated filling pressures.   Weight down 1 pound.   Feeling better. Denies SOB.  Objective:   Weight Range: 76.7 kg Body mass index is 29.94 kg/m.   Vital Signs:   Temp:  [97.4 F (36.3 C)-98.1 F (36.7 C)] 98 F (36.7 C) (09/19 1103) Pulse Rate:  [60-64] 60 (09/19 1103) Resp:  [16-18] 16 (09/19 0900) BP: (81-124)/(44-73) 107/62 (09/19 1103) SpO2:  [93 %-96 %] 95 % (09/19 1103) Weight:  [76.7 kg-77.1 kg] 76.7 kg (09/19 0411) Last BM Date : 03/03/23  Weight change: Filed Weights   03/01/23 1501 03/04/23 1614 03/05/23 0411  Weight: 82 kg 77.1 kg 76.7 kg    Intake/Output:   Intake/Output Summary (Last 24 hours) at 03/05/2023 1601 Last data filed at 03/05/2023 0824 Gross per 24 hour  Intake 714.1 ml  Output --  Net 714.1 ml      Physical Exam   General: NAD   Neck: supple. JVP 6-7  Carotids 2+ bilat; no bruits. No lymphadenopathy or thryomegaly appreciated. Cor: PMI nondisplaced. Regular rate & rhythm. No rubs, gallops or murmurs. Lungs: clear Abdomen: soft, nontender, nondistended. No hepatosplenomegaly. No bruits or masses. Good bowel sounds. Extremities: no cyanosis, clubbing, rash, R and LLE trace edema Neuro: alert & orientedx3, cranial nerves grossly intact. moves all 4 extremities w/o difficulty. Affect pleasant    Telemetry  SR    EKG    N/A  Labs    CBC Recent Labs    03/03/23 1302 03/03/23 1505 03/03/23 1511  WBC 6.1  --   --   NEUTROABS 3.7  --   --   HGB 9.5* 10.5* 10.9*  10.5*  HCT 30.6* 31.0* 32.0*  31.0*  MCV 90.3  --   --   PLT 165  --   --    Basic Metabolic Panel Recent Labs    56/21/30 0644 03/05/23 0426  NA 136 133*  K 3.8 3.7  CL 105 96*  CO2 20* 25  GLUCOSE 107* 146*  BUN 38* 48*  CREATININE 1.32* 1.59*  CALCIUM 8.6* 8.7*   Liver Function Tests No results for  input(s): "AST", "ALT", "ALKPHOS", "BILITOT", "PROT", "ALBUMIN" in the last 72 hours. No results for input(s): "LIPASE", "AMYLASE" in the last 72 hours. Cardiac Enzymes No results for input(s): "CKTOTAL", "CKMB", "CKMBINDEX", "TROPONINI" in the last 72 hours.  BNP: BNP (last 3 results) No results for input(s): "BNP" in the last 8760 hours.  ProBNP (last 3 results) No results for input(s): "PROBNP" in the last 8760 hours.   D-Dimer No results for input(s): "DDIMER" in the last 72 hours. Hemoglobin A1C Recent Labs    03/04/23 0644  HGBA1C 8.0*   Fasting Lipid Panel No results for input(s): "CHOL", "HDL", "LDLCALC", "TRIG", "CHOLHDL", "LDLDIRECT" in the last 72 hours.  Thyroid Function Tests No results for input(s): "TSH", "T4TOTAL", "T3FREE", "THYROIDAB" in the last 72 hours.  Invalid input(s): "FREET3"  Other results:   Imaging    No results found.   Medications:     Scheduled Medications:  aspirin EC  81 mg Oral Daily   atorvastatin  40 mg Oral Daily   fenofibrate  160 mg Oral Daily   ferrous sulfate  325 mg Oral Q breakfast   furosemide  80 mg Intravenous BID   influenza vaccine adjuvanted  0.5 mL Intramuscular Tomorrow-1000  insulin aspart  0-15 Units Subcutaneous TID WC   insulin aspart  10 Units Subcutaneous TID WC   insulin glargine-yfgn  20 Units Subcutaneous QHS   isosorbide mononitrate  60 mg Oral Daily   metoprolol succinate  100 mg Oral Daily   sodium chloride flush  3 mL Intravenous Q12H   spironolactone  12.5 mg Oral Daily    Infusions:  sodium chloride     cefTRIAXone (ROCEPHIN)  IV 1 g (03/05/23 1516)   heparin 1,250 Units/hr (03/05/23 1601)    PRN Medications: sodium chloride, acetaminophen, nitroGLYCERIN, ondansetron (ZOFRAN) IV, sodium chloride flush    Patient Profile   Ms Denunzio is a 67 year old with a history of DMII, HLD, CKD Stage IIIa, permanent  A Fib, 01/02/2023 AV node ablation  and single chamber PPM.    Admitted with  chest pain. Cath showed RA 19, PCWP 36, elevated PA pressures, PVR 2.9, CO 4.7, CI 2.7.   Assessment/Plan  1. Chest Pain HS Trop 19>29  LHC- Multivessel disease.   Prox RCA lesion is 99% stenosed. Subtotal Occlusion - TIMI 2 flow   Mid RCA lesion is 80% stenosed.  Mid RCA to Dist RCA lesion is 90% stenosed. Dist RCA lesion is 70% stenosed.   Mid Cx lesion is 80% stenosed prior to distal OM-LPL   Mid LAD lesion is 50% stenosed. Dist LAD lesion is 90% stenosed.   2nd Diag lesion is 70% stenosed. - On aspirin, atorvastatin, Toprol XL, and imdur .  -Final plan for intervention with PCI versus CABG pending.  -CT surgery consulted . Repeat cath  - No chest pain.    2. A/C HFpEF -Echo 60-65%  -Elevated filling pressures, RA 19, PCWP 36, CO 4.7, CI 2.6  -Volume status looks better. Creatinine bump. Stop IV lasix.   - Holding SGLT2i with UTI.   -Hold Cleda Daub.  - SBP 90s . No room for titration.  - Set up for RHC.    3. Pulmonary Venous HTN  - Known OSA.  -Elevated filling pressures. PVR 2.9  -Volume status improved. Set up repeat RHC.   4. DMII -Hgb A1C pending.  -On SSI  - Hold SGLT2i with UTI.    5. Permanent A fib S/P AV Node ablation 12/2022.    Off diltiazem   6. UTI + UA  -Urine culture pending.  -Day 2 Rocephin.    7. AKI on CKD Stage II Creatinine 1.2>1.5 >1.3 >1.6 Stop IV lasix and spiro.     8. OSA Continue CPAP   9.Obesity. Body mass index is 32.02 kg/m.  Set up for RHC. NPO after MN.   Length of Stay: 1  Amy Clegg, NP  03/05/2023, 4:01 PM  Advanced Heart Failure Team Pager [phone] (M-F; 7a - 5p)  Please contact [facility] for night-coverage after hours (5p -7a ) and weekends on [email or web address]  Patient seen with NP, agree with the above note.   No complaints today.  Breathing is better.  I/Os are not complete.  Creatinine higher at 1.59.   General: NAD Neck: No JVD, no thyromegaly or thyroid nodule.  Lungs: Clear to auscultation bilaterally with  normal respiratory effort. CV: Nondisplaced PMI.  Heart regular S1/S2, no S3/S4, no murmur.  No peripheral edema.   Abdomen: Soft, nontender, no hepatosplenomegaly, no distention.  Skin: Intact without lesions or rashes.  Neurologic: Alert and oriented x 3.  Psych: Normal affect. Extremities: No clubbing or cyanosis.  HEENT: Normal.   1. Acute on chronic  diastolic CHF with prominent RV failure: Echo this admission with EF 60-65% with D-shaped interventricular septum, moderately dilated RV with mild RV dysfunction, mild MR, moderate TR, dilated IVC.  RHC showed mean RA 20, PA 82/28 mean 50, mean PCWP 36 with v-waves to 50, CI 2.57, PVR 2.9 WU => elevated right and left heart filling pressures with severe primarily pulmonary venous hypertension.  She was started on IV lasix, has diuresed and volume status looks better, creatinine higher at 1.59.   - Stop Lasix for now with rise in creatinine.  - Hold off on SGLT2 inhibitor with UTI.  - Hold spironolactone with rise in creatinine.   - Will repeat RHC tomorrow reassess PA pressure and filling pressures with diuresis.  Discussed risks/benefits and she agrees to procedure.  2. CAD: Unstable angina at presentation, severe 3VD on cath. Diffuse RCA disease with subtotal occlusion and collaterals to PDA from left, 90% mid LAD stenosis, 80% mid LCx stenosis.  She is a diabetic.  Dr. Laneta Simmers has seen for CABG evaluation, he is concerned about her elevated PA pressure and is concerned that he may be unable to graft her RCA/PDA system which is likely the culprit for her presentation. The disease in this vessel is also too extensive to be approached percutaneously. .  - Continue ASA 81 - Continue statin - Can continue current Toprol XL and Imdur. - RHC tomorrow to reassess PA pressure and filling pressures before making final decision on CABG vs PCI. If no CABG, the LAD and LCx stenoses are focal and could be addressed via PCI.  3.  Pulmonary hypertension: Severe  pulmonary venous hypertension with PVR only 2.9 WU.  OSA may contribute.  Treatment will be aggressive diuresis.  - Repeat RHC tomorrow.  4. OSA: Continue CPAP.  5. Atrial fibrillation: Permanent.  S/p AV nodal ablation with PPM (has left bundle lead).  Paced QRS is narrow due to LB lead.   - Stopped diltiazem CD, not needed for rate control given AV nodal ablation.  - Continue heparin gtt..  6. AKI on CKD stage 3: Creatinine  higher than baseline, 1.49 => 1.32 => 1.53 today.  Suspect baseline diabetic nephropathy.  - Stopping Lasix today.  7. Type 2 diabetes: SSI for now.   Marca Ancona 03/05/2023 4:54 PM

## 2023-03-05 NOTE — Progress Notes (Signed)
Mobility Specialist Progress Note:   03/05/23 1300  Mobility  Activity Ambulated with assistance in hallway  Level of Assistance Standby assist, set-up cues, supervision of patient - no hands on  Assistive Device None  Distance Ambulated (ft) 480 ft  Activity Response Tolerated well  Mobility Referral Yes  $Mobility charge 1 Mobility  Mobility Specialist Start Time (ACUTE ONLY) 1207  Mobility Specialist Stop Time (ACUTE ONLY) 1218  Mobility Specialist Time Calculation (min) (ACUTE ONLY) 11 min    Pre Mobility: 60 HR During Mobility: 91 HR Post Mobility:  86 HR  Pt received in bed, agreeable to mobility. Asymptomatic throughout. Took x1 standing rest break. VSS throughout. Pt left in bed with call bell and all needs met.  Emily Farmer Mobility Specialist Please contact via Special educational needs teacher or Rehab office at [phone]

## 2023-03-05 NOTE — Progress Notes (Signed)
   Patient Name: Emily Farmer Date of Encounter: 03/05/2023 [facility] Cardiologist: Chrystie Nose, MD   Interval Summary  .    No acute events. Feels that she is making good urine. Aware about the pending decision whether CABG is an option for her.  Vital Signs .    Vitals:   03/05/23 0411 03/05/23 0745 03/05/23 0900 03/05/23 1103  BP: 110/61 124/62 124/62 107/62  Pulse: 61 64 61 60  Resp: 18 18 16    Temp: 98.1 F (36.7 C) 98 F (36.7 C) (!) 97.4 F (36.3 C)   TempSrc: Oral Oral Oral   SpO2: 96% 96% 93%   Weight: 76.7 kg     Height:        Intake/Output Summary (Last 24 hours) at 03/05/2023 1149 Last data filed at 03/05/2023 0824 Gross per 24 hour  Intake 714.1 ml  Output --  Net 714.1 ml      03/05/2023    4:11 AM 03/04/2023    4:14 PM 03/01/2023    3:01 PM  Last 3 Weights  Weight (lbs) 169 lb 170 lb 180 lb 12.4 oz  Weight (kg) 76.658 kg 77.111 kg 82 kg      Telemetry/ECG    V-paced rhythm, HR in the 60s - Personally Reviewed  Physical Exam .   GEN: No acute distress.  Sitting comfortably in the bed  Neck: No JVD Cardiac: RRR, no murmurs, rubs, or gallops. Right radial cath site is soft, nontender.  Respiratory: Crackles in bilateral lung bases. Normal work of breathing on room air  GI: Soft, nontender, non-distended  MS: No edema in BLE   Assessment & Plan .     CAD  -R/LHC with elevated PA pressures, multivessel disease -seen by Dr. Laneta Simmers. Unclear that she would benefit from CABG. Final decision to be made after repeat right heart cath -Continue heparin, ASA 81 mg daily, lipitor 40 mg daily, imdur 60 mg daily, metoprolol succinate 100 mg daily   Severe pulmonary Htn  Acute on chronic HFpEF  Acute kidney injury on chronic kidney disease stage 2 -R/L heart catheterization revealed findings consistent with severe pulmonary hypertension  -appreciate advanced heart failure management -I/O inaccurate. Admission weight 82 kg, weight today 76.7  kg -Cr up slightly today, from 1.32 to 1.59.   Type 2 DM  -On ssi while admitted  -SGLT2i on hold as patient has UTI   Permanent Afib  History of Tachy-brady syndrome  -continue IV heparin as we are considering CABG vs PCI   OSA  - On cpap   For questions or updates, please contact [facility] Please consult [email or web address] for contact info under     Signed, Jodelle Red, MD

## 2023-03-05 NOTE — Progress Notes (Addendum)
Hypoglycemic Event  CBG: 54  Treatment: 8 oz juice/soda, per hypoglycemic protocol.   Symptoms: None  Follow-up CBG: Time:1632 CBG Result:72  Possible Reasons for Event: Medication regimen:    Comments/MD notified: notified Robet Leu, PA and Marden Noble, NP.  New orders received and dinner time dosing held.     Alpha Gula

## 2023-03-05 NOTE — Progress Notes (Signed)
ANTICOAGULATION CONSULT NOTE   Pharmacy Consult for Heparin Indication:  ACS/STEMI; afib on Eliquis  Allergies  Allergen Reactions   Trulicity [Dulaglutide] Diarrhea    Patient Measurements: Height: 5\' 3"  (160 cm) Weight: 76.7 kg (169 lb) IBW/kg (Calculated) : 52.4 Heparin Dosing Weight: 70.5 kg  Vital Signs: Temp: 98 F (36.7 C) (09/19 1103) Temp Source: Oral (09/19 1103) BP: 107/62 (09/19 1103) Pulse Rate: 60 (09/19 1103)  Labs: Recent Labs    03/03/23 0434 03/03/23 1302 03/03/23 1302 03/03/23 1505 03/03/23 1511 03/04/23 0644 03/04/23 0644 03/04/23 1827 03/05/23 0426 03/05/23 1157  HGB  --  9.5*   < > 10.5* 10.9*  10.5*  --   --   --   --   --   HCT  --  30.6*  --  31.0* 32.0*  31.0*  --   --   --   --   --   PLT  --  165  --   --   --   --   --   --   --   --   APTT  --   --   --   --   --  54*   < > 58* 119* 89*  HEPARINUNFRC  --   --   --   --   --  >1.10*  --   --  >1.10*  --   CREATININE 1.49*  --   --   --   --  1.32*  --   --  1.59*  --    < > = values in this interval not displayed.    Estimated Creatinine Clearance: 34.6 mL/min (A) (by C-G formula based on SCr of 1.59 mg/dL (H)).   Medical History: Past Medical History:  Diagnosis Date   Arthritis    Atrial fibrillation (HCC)    a. 09/2016 in setting of pancreatitis;  b. 09/2016 Echo: EF 65-60%, no rwma, Gr2 DD, mild MR, triv TR, PASP ;  CHA2DS2VASc = 4-->Eliquis 5mg  BID. c. Recurred 06/2018 in setting of GIB.   Chronic diastolic CHF (congestive heart failure) (HCC) 01/28/2018   Diabetes mellitus    GI bleeding 06/2018   Gout    Hyperlipidemia    Hypertension    Hypertriglyceridemia    Pancreatitis    a. 09/2016 - Triglycerides 1,392 on admission.    Medications:  Scheduled:   aspirin EC  81 mg Oral Daily   atorvastatin  40 mg Oral Daily   fenofibrate  160 mg Oral Daily   ferrous sulfate  325 mg Oral Q breakfast   furosemide  80 mg Intravenous BID   influenza vaccine adjuvanted   0.5 mL Intramuscular Tomorrow-1000   insulin aspart  0-15 Units Subcutaneous TID WC   insulin aspart  10 Units Subcutaneous TID WC   insulin glargine-yfgn  20 Units Subcutaneous QHS   isosorbide mononitrate  60 mg Oral Daily   metoprolol succinate  100 mg Oral Daily   sodium chloride flush  3 mL Intravenous Q12H   spironolactone  12.5 mg Oral Daily   Infusions:   sodium chloride     cefTRIAXone (ROCEPHIN)  IV 1 g (03/04/23 1440)   heparin 1,250 Units/hr (03/04/23 2231)   PRN: sodium chloride, acetaminophen, nitroGLYCERIN, ondansetron (ZOFRAN) IV, sodium chloride flush  Assessment: 67 yo female on chronic Eliquis for afib. 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 Pharmacy consulted to start IV heparin while undergoing assessment for potential multivessel PCI versus CABG. Since on  Eliquis, will utilize aPTT for monitoring heparin  -aPTT 119 this am but repeat was 89 on 1150 units/hr -likely lab error this am  Goal of Therapy:  Heparin level 0.3-0.7 units/ml aPTT 66-102 sec Monitor platelets by anticoagulation protocol: Yes   Plan:  -Continue heparin 1150 units/hr -Daily aPTT, heparin and CBC  Harland German, PharmD Clinical Pharmacist **Pharmacist phone directory can now be found on [email or web address] ([email or web address]).  Listed under [facility].

## 2023-03-06 ENCOUNTER — Encounter (HOSPITAL_COMMUNITY)
Admission: EM | Disposition: A | Discharge status: Skilled Nursing Facility | Payer: Self-pay | Source: Home / Self Care | Attending: Surgery

## 2023-03-06 ENCOUNTER — Encounter (HOSPITAL_COMMUNITY): Payer: Self-pay | Admitting: Cardiology

## 2023-03-06 DIAGNOSIS — I5033 Acute on chronic diastolic (congestive) heart failure: Secondary | ICD-10-CM | POA: Diagnosis not present

## 2023-03-06 DIAGNOSIS — I270 Primary pulmonary hypertension: Secondary | ICD-10-CM | POA: Diagnosis not present

## 2023-03-06 DIAGNOSIS — I2510 Atherosclerotic heart disease of native coronary artery without angina pectoris: Secondary | ICD-10-CM | POA: Diagnosis not present

## 2023-03-06 HISTORY — PX: RIGHT HEART CATH: CATH118263

## 2023-03-06 LAB — POCT I-STAT EG7
Acid-Base Excess: 4 mmol/L — ABNORMAL HIGH (ref 0.0–2.0)
Acid-Base Excess: 5 mmol/L — ABNORMAL HIGH (ref 0.0–2.0)
Bicarbonate: 29.8 mmol/L — ABNORMAL HIGH (ref 20.0–28.0)
Bicarbonate: 29.9 mmol/L — ABNORMAL HIGH (ref 20.0–28.0)
Calcium, Ion: 1.14 mmol/L — ABNORMAL LOW (ref 1.15–1.40)
Calcium, Ion: 1.15 mmol/L (ref 1.15–1.40)
HCT: 35 % — ABNORMAL LOW (ref 36.0–46.0)
HCT: 35 % — ABNORMAL LOW (ref 36.0–46.0)
Hemoglobin: 11.9 g/dL — ABNORMAL LOW (ref 12.0–15.0)
Hemoglobin: 11.9 g/dL — ABNORMAL LOW (ref 12.0–15.0)
O2 Saturation: 59 %
O2 Saturation: 63 %
Potassium: 3.8 mmol/L (ref 3.5–5.1)
Potassium: 3.8 mmol/L (ref 3.5–5.1)
Sodium: 137 mmol/L (ref 135–145)
Sodium: 137 mmol/L (ref 135–145)
TCO2: 31 mmol/L (ref 22–32)
TCO2: 31 mmol/L (ref 22–32)
pCO2, Ven: 46.1 mmHg (ref 44–60)
pCO2, Ven: 46.5 mmHg (ref 44–60)
pH, Ven: 7.415 (ref 7.25–7.43)
pH, Ven: 7.419 (ref 7.25–7.43)
pO2, Ven: 31 mmHg — CL (ref 32–45)
pO2, Ven: 33 mmHg (ref 32–45)

## 2023-03-06 LAB — BASIC METABOLIC PANEL
Anion gap: 12 (ref 5–15)
BUN: 50 mg/dL — ABNORMAL HIGH (ref 8–23)
CO2: 29 mmol/L (ref 22–32)
Calcium: 9 mg/dL (ref 8.9–10.3)
Chloride: 95 mmol/L — ABNORMAL LOW (ref 98–111)
Creatinine, Ser: 1.79 mg/dL — ABNORMAL HIGH (ref 0.44–1.00)
GFR, Estimated: 31 mL/min — ABNORMAL LOW (ref >60)
Glucose, Bld: 151 mg/dL — ABNORMAL HIGH (ref 70–99)
Potassium: 4 mmol/L (ref 3.5–5.1)
Sodium: 136 mmol/L (ref 135–145)

## 2023-03-06 LAB — APTT
aPTT: 144 seconds — ABNORMAL HIGH (ref 24–36)
aPTT: 79 seconds — ABNORMAL HIGH (ref 24–36)

## 2023-03-06 LAB — GLUCOSE, CAPILLARY
Glucose-Capillary: 163 mg/dL — ABNORMAL HIGH (ref 70–99)
Glucose-Capillary: 111 mg/dL — ABNORMAL HIGH (ref 70–99)
Glucose-Capillary: 92 mg/dL (ref 70–99)
Glucose-Capillary: 113 mg/dL — ABNORMAL HIGH (ref 70–99)

## 2023-03-06 LAB — HEPARIN LEVEL (UNFRACTIONATED): Heparin Unfractionated: >1.1 IU/mL — ABNORMAL HIGH (ref 0.30–0.70)

## 2023-03-06 SURGERY — RIGHT HEART CATH
Anesthesia: LOCAL

## 2023-03-06 MED ORDER — HYDRALAZINE HCL 20 MG/ML IJ SOLN: 10.0000 mg | INTRAMUSCULAR | Status: AC | PRN

## 2023-03-06 MED ORDER — TRANEXAMIC ACID 1000 MG/10ML IV SOLN
1.5000 mg/kg/h | INTRAVENOUS | Status: AC
  Administered 2023-03-09: 1.5 mg/kg/h via INTRAVENOUS
  Filled 2023-03-06: qty 25

## 2023-03-06 MED ORDER — SODIUM CHLORIDE 0.9 % IV SOLN
250.0000 mL | INTRAVENOUS | Status: DC | PRN
  Administered 2023-03-06: 250 mL via INTRAVENOUS

## 2023-03-06 MED ORDER — POTASSIUM CHLORIDE 2 MEQ/ML IV SOLN
80.0000 meq | INTRAVENOUS | Status: DC
  Filled 2023-03-06: qty 40

## 2023-03-06 MED ORDER — TRANEXAMIC ACID (OHS) BOLUS VIA INFUSION
15.0000 mg/kg | INTRAVENOUS | Status: AC
  Administered 2023-03-09: 1120.5 mg via INTRAVENOUS
  Filled 2023-03-06: qty 1121

## 2023-03-06 MED ORDER — CEFAZOLIN SODIUM-DEXTROSE 2-4 GM/100ML-% IV SOLN
2.0000 g | INTRAVENOUS | Status: DC
  Filled 2023-03-06: qty 100

## 2023-03-06 MED ORDER — SODIUM CHLORIDE 0.9% FLUSH
3.0000 mL | Freq: Two times a day (BID) | INTRAVENOUS | Status: DC
  Administered 2023-03-06 – 2023-03-07 (×3): 3 mL via INTRAVENOUS

## 2023-03-06 MED ORDER — NOREPINEPHRINE 4 MG/250ML-% IV SOLN
0.0000 ug/min | INTRAVENOUS | Status: DC
  Filled 2023-03-06: qty 250

## 2023-03-06 MED ORDER — LABETALOL HCL 5 MG/ML IV SOLN: 10.0000 mg | INTRAVENOUS | Status: AC | PRN

## 2023-03-06 MED ORDER — MAGNESIUM SULFATE 50 % IJ SOLN
40.0000 meq | INTRAMUSCULAR | Status: DC
  Filled 2023-03-06: qty 9.85

## 2023-03-06 MED ORDER — MANNITOL 20 % IV SOLN
INTRAVENOUS | Status: DC
  Filled 2023-03-06: qty 13

## 2023-03-06 MED ORDER — EPINEPHRINE HCL 5 MG/250ML IV SOLN IN NS
0.0000 ug/min | INTRAVENOUS | Status: DC
  Filled 2023-03-06: qty 250

## 2023-03-06 MED ORDER — SODIUM CHLORIDE 0.9% FLUSH: 3.0000 mL | INTRAVENOUS | Status: DC | PRN

## 2023-03-06 MED ORDER — PHENYLEPHRINE HCL-NACL 20-0.9 MG/250ML-% IV SOLN
30.0000 ug/min | INTRAVENOUS | Status: AC
  Administered 2023-03-09: 25 ug/min via INTRAVENOUS
  Administered 2023-03-09: 15 ug/min via INTRAVENOUS
  Filled 2023-03-06: qty 250

## 2023-03-06 MED ORDER — DEXMEDETOMIDINE HCL IN NACL 400 MCG/100ML IV SOLN
0.1000 ug/kg/h | INTRAVENOUS | Status: AC
  Administered 2023-03-09: .6 ug/kg/h via INTRAVENOUS
  Filled 2023-03-06: qty 100

## 2023-03-06 MED ORDER — HEPARIN (PORCINE) 25000 UT/250ML-% IV SOLN
1000.0000 [IU]/h | INTRAVENOUS | Status: DC
  Administered 2023-03-06 – 2023-03-07 (×2): 1250 [IU]/h via INTRAVENOUS
  Administered 2023-03-08: 1000 [IU]/h via INTRAVENOUS
  Filled 2023-03-06 (×2): qty 250

## 2023-03-06 MED ORDER — VANCOMYCIN HCL 1250 MG/250ML IV SOLN
1250.0000 mg | INTRAVENOUS | Status: AC
  Administered 2023-03-09: 1250 mg via INTRAVENOUS
  Filled 2023-03-06: qty 250

## 2023-03-06 MED ORDER — TRANEXAMIC ACID (OHS) PUMP PRIME SOLUTION
2.0000 mg/kg | INTRAVENOUS | Status: DC
  Filled 2023-03-06: qty 1.49

## 2023-03-06 MED ORDER — MILRINONE LACTATE IN DEXTROSE 20-5 MG/100ML-% IV SOLN
0.3000 ug/kg/min | INTRAVENOUS | Status: DC
  Filled 2023-03-06: qty 100

## 2023-03-06 MED ORDER — HEPARIN (PORCINE) IN NACL 1000-0.9 UT/500ML-% IV SOLN
INTRAVENOUS | Status: DC | PRN
  Administered 2023-03-06: 500 mL

## 2023-03-06 MED ORDER — LIDOCAINE HCL (PF) 1 % IJ SOLN
INTRAMUSCULAR | Status: AC
  Filled 2023-03-06: qty 30

## 2023-03-06 MED ORDER — NITROGLYCERIN IN D5W 200-5 MCG/ML-% IV SOLN
2.0000 ug/min | INTRAVENOUS | Status: DC
  Filled 2023-03-06: qty 250

## 2023-03-06 MED ORDER — INSULIN REGULAR(HUMAN) IN NACL 100-0.9 UT/100ML-% IV SOLN
INTRAVENOUS | Status: AC
  Administered 2023-03-09: 3.6 [IU]/h via INTRAVENOUS
  Filled 2023-03-06: qty 100

## 2023-03-06 MED ORDER — LIDOCAINE HCL (PF) 1 % IJ SOLN
INTRAMUSCULAR | Status: DC | PRN
  Administered 2023-03-06: 5 mL via INTRADERMAL

## 2023-03-06 MED ORDER — ONDANSETRON HCL 4 MG/2ML IJ SOLN: 4.0000 mg | Freq: Four times a day (QID) | INTRAMUSCULAR | Status: DC | PRN

## 2023-03-06 MED ORDER — PLASMA-LYTE A IV SOLN
INTRAVENOUS | Status: DC
  Filled 2023-03-06: qty 2.5

## 2023-03-06 MED ORDER — ACETAMINOPHEN 325 MG PO TABS: 650.0000 mg | ORAL_TABLET | ORAL | Status: DC | PRN

## 2023-03-06 MED ORDER — HEPARIN 30,000 UNITS/1000 ML (OHS) CELLSAVER SOLUTION
Status: DC
  Filled 2023-03-06: qty 1000

## 2023-03-06 SURGICAL SUPPLY — 6 items
CATH BALLN WEDGE 5F 110CM (CATHETERS) IMPLANT
ELECT DEFIB PAD ADLT CADENCE (PAD) IMPLANT
PACK CARDIAC CATHETERIZATION (CUSTOM PROCEDURE TRAY) ×1 IMPLANT
SHEATH GLIDE SLENDER 4/5FR (SHEATH) IMPLANT
TRANSDUCER W/STOPCOCK (MISCELLANEOUS) IMPLANT
TUBING ART PRESS 72 MALE/FEM (TUBING) IMPLANT

## 2023-03-06 NOTE — Plan of Care (Signed)

## 2023-03-06 NOTE — Progress Notes (Signed)
Day of Surgery Procedure(s) (LRB): RIGHT HEART CATH (N/A) Subjective: No chest pain or SOB, ambulating to bathroom.  RHC today shows much improvement in PA pressures and wedge.  Objective: Vital signs in last 24 hours: Temp:  [97.6 F (36.4 C)-97.8 F (36.6 C)] 97.6 F (36.4 C) (09/20 1023) Pulse Rate:  [0-64] 62 (09/20 1023) Cardiac Rhythm: Ventricular paced (09/20 0800) Resp:  [13-18] 18 (09/20 1023) BP: (93-127)/(50-77) 113/67 (09/20 1023) SpO2:  [92 %-100 %] 98 % (09/20 1023) Weight:  [74.7 kg] 74.7 kg (09/20 0530)  Hemodynamic parameters for last 24 hours:    Intake/Output from previous day: 09/19 0701 - 09/20 0700 In: 684.4 [P.O.:180; I.V.:387.3; IV Piggyback:117] Out: -  Intake/Output this shift: Total I/O In: 718 [P.O.:718] Out: 925 [Urine:925]  General appearance: alert and cooperative Heart: regular rate and rhythm, S1, S2 normal, no murmur Lungs: clear to auscultation bilaterally  Lab Results: Recent Labs    03/03/23 1505 03/03/23 1511  HGB 10.5* 10.9*  10.5*  HCT 31.0* 32.0*  31.0*   BMET:  Recent Labs    03/05/23 0426 03/06/23 0810  NA 133* 136  K 3.7 4.0  CL 96* 95*  CO2 25 29  GLUCOSE 146* 151*  BUN 48* 50*  CREATININE 1.59* 1.79*  CALCIUM 8.7* 9.0    PT/INR: No results for input(s): "LABPROT", "INR" in the last 72 hours. ABG    Component Value Date/Time   PHART 7.343 (L) 03/03/2023 1505   HCO3 19.3 (L) 03/03/2023 1511   HCO3 19.4 (L) 03/03/2023 1511   TCO2 20 (L) 03/03/2023 1511   TCO2 20 (L) 03/03/2023 1511   ACIDBASEDEF 6.0 (H) 03/03/2023 1511   ACIDBASEDEF 6.0 (H) 03/03/2023 1511   O2SAT 49 03/03/2023 1511   O2SAT 49 03/03/2023 1511   CBG (last 3)  Recent Labs    03/05/23 2100 03/06/23 0756 03/06/23 1119  GLUCAP 204* 163* 111*   Right Heart Pressures RHC Procedural Findings: Hemodynamics (mmHg) RA mean 3 RV 47/4 PA 47/11, mean 28 PCWP mean 14 with v-waves to 26 (prominent V-waves)  Oxygen saturations: PA  61% AO 100%  Cardiac Output (Fick) 4.21  Cardiac Index (Fick) 2.37 PVR 3.3 WU    Assessment/Plan:  Right heart pressures much improved. Therefore I think CABG is the best treatment for her. Her creat increased today to 1.79 from her baseline of 1.21 on admission. It was 1.13 in June. The increase is likely related to cath three days ago and then diuresis. Will follow over the weekend and if it stabilizes we will plan CABG Monday. She is in agreement.   LOS: 2 days    [name] 03/06/2023

## 2023-03-06 NOTE — Progress Notes (Signed)
ANTICOAGULATION CONSULT NOTE   Pharmacy Consult for Heparin Indication:  ACS/STEMI; afib on Eliquis  Allergies  Allergen Reactions   Trulicity [Dulaglutide] Diarrhea    Patient Measurements: Height: 5\' 3"  (160 cm) Weight: 74.7 kg (164 lb 11.2 oz) IBW/kg (Calculated) : 52.4 Heparin Dosing Weight: 70.5 kg  Vital Signs: Temp: 98.5 F (36.9 C) (09/20 1957) Temp Source: Oral (09/20 1957) BP: 91/60 (09/20 1957) Pulse Rate: 64 (09/20 1650)  Labs: Recent Labs    03/04/23 0644 03/04/23 1827 03/05/23 0426 03/05/23 1157 03/06/23 0810 03/06/23 1005 03/06/23 2032  HGB  --   --   --   --   --  11.9*  11.9*  --   HCT  --   --   --   --   --  35.0*  35.0*  --   APTT 54*   < > 119* 89* 144*  --  79*  HEPARINUNFRC >1.10*  --  >1.10*  --  >1.10*  --   --   CREATININE 1.32*  --  1.59*  --  1.79*  --   --    < > = values in this interval not displayed.    Estimated Creatinine Clearance: 30.3 mL/min (A) (by C-G formula based on SCr of 1.79 mg/dL (H)).   Assessment: 67 yo female on chronic Eliquis for afib. 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 Pharmacy consulted to start IV heparin while undergoing assessment for potential multivessel PCI versus CABG. Since on Eliquis, will utilize aPTT for monitoring heparin  -aPTT 79 and therapeutic at 1250 units/hr, no bleeding noted  Goal of Therapy:  Heparin level 0.3-0.7 units/ml aPTT 66-102 sec Monitor platelets by anticoagulation protocol: Yes   Plan:  -Continue heparin drip at 1250 units/hr -Daily aPTT, heparin level, CBC -Monitor for s/sx of bleeding  Thank you for involving pharmacy in this patient's care.  Loura Back, PharmD, BCPS Clinical Pharmacist Clinical phone for 03/06/2023 is [phone] 03/06/2023 9:24 PM

## 2023-03-06 NOTE — Progress Notes (Signed)
Patient ID: Emily Farmer, female   DOB: 16-Jun-1956, 67 y.o.   MRN: [id number]     Advanced Heart Failure Rounding Note  PCP-Cardiologist: Chrystie Nose, MD   Subjective:    Patient denies chest pain or dyspnea.  Lasix stopped with creatinine rise, up to 1.79 today.   RHC (9/20):  Hemodynamics (mmHg) RA mean 3 RV 47/4 PA 47/11, mean 28 PCWP mean 14 with v-waves to 26 (prominent V-waves) Oxygen saturations: PA 61% AO 100% Cardiac Output (Fick) 4.21  Cardiac Index (Fick) 2.37 PVR 3.3 WU   Objective:   Weight Range: 74.7 kg Body mass index is 29.18 kg/m.   Vital Signs:   Temp:  [97.7 F (36.5 C)-98 F (36.7 C)] 97.7 F (36.5 C) (09/20 0758) Pulse Rate:  [0-64] 0 (09/20 1008) Resp:  [13-18] 18 (09/20 1003) BP: (93-127)/(50-77) 127/69 (09/20 1003) SpO2:  [92 %-100 %] 100 % (09/20 1003) Weight:  [74.7 kg] 74.7 kg (09/20 0530) Last BM Date : 03/03/23  Weight change: Filed Weights   03/04/23 1614 03/05/23 0411 03/06/23 0530  Weight: 77.1 kg 76.7 kg 74.7 kg    Intake/Output:   Intake/Output Summary (Last 24 hours) at 03/06/2023 1014 Last data filed at 03/06/2023 0905 Gross per 24 hour  Intake 504.35 ml  Output 525 ml  Net -20.65 ml      Physical Exam    General: NAD Neck: No JVD, no thyromegaly or thyroid nodule.  Lungs: Clear to auscultation bilaterally with normal respiratory effort. CV: Nondisplaced PMI.  Heart regular S1/S2, no S3/S4, no murmur.  No peripheral edema.    Abdomen: Soft, nontender, no hepatosplenomegaly, no distention.  Skin: Intact without lesions or rashes.  Neurologic: Alert and oriented x 3.  Psych: Normal affect. Extremities: No clubbing or cyanosis.  HEENT: Normal.    Telemetry   Atrial fibrillation with v-pacing (personally reviewed)   EKG    N/A  Labs    CBC Recent Labs    03/03/23 1302 03/03/23 1505 03/03/23 1511  WBC 6.1  --   --   NEUTROABS 3.7  --   --   HGB 9.5* 10.5* 10.9*  10.5*  HCT 30.6* 31.0* 32.0*   31.0*  MCV 90.3  --   --   PLT 165  --   --    Basic Metabolic Panel Recent Labs    41/32/44 0426 03/06/23 0810  NA 133* 136  K 3.7 4.0  CL 96* 95*  CO2 25 29  GLUCOSE 146* 151*  BUN 48* 50*  CREATININE 1.59* 1.79*  CALCIUM 8.7* 9.0   Liver Function Tests No results for input(s): "AST", "ALT", "ALKPHOS", "BILITOT", "PROT", "ALBUMIN" in the last 72 hours. No results for input(s): "LIPASE", "AMYLASE" in the last 72 hours. Cardiac Enzymes No results for input(s): "CKTOTAL", "CKMB", "CKMBINDEX", "TROPONINI" in the last 72 hours.  BNP: BNP (last 3 results) No results for input(s): "BNP" in the last 8760 hours.  ProBNP (last 3 results) No results for input(s): "PROBNP" in the last 8760 hours.   D-Dimer No results for input(s): "DDIMER" in the last 72 hours. Hemoglobin A1C Recent Labs    03/04/23 0644  HGBA1C 8.0*   Fasting Lipid Panel No results for input(s): "CHOL", "HDL", "LDLCALC", "TRIG", "CHOLHDL", "LDLDIRECT" in the last 72 hours.  Thyroid Function Tests No results for input(s): "TSH", "T4TOTAL", "T3FREE", "THYROIDAB" in the last 72 hours.  Invalid input(s): "FREET3"  Other results:   Imaging    CARDIAC CATHETERIZATION  Result Date: 03/06/2023  1.  Low right atrial pressure. 2. Normal PCWP.  Prominent V-waves but only mild MR by echo, may be due to diastolic dysfunction. 3. Mild primarily pulmonary arterial hypertension. Significant improvement from prior study.     Medications:     Scheduled Medications:  [MAR Hold] aspirin EC  81 mg Oral Daily   [MAR Hold] atorvastatin  40 mg Oral Daily   [MAR Hold] fenofibrate  160 mg Oral Daily   [MAR Hold] ferrous sulfate  325 mg Oral Q breakfast   [MAR Hold] influenza vaccine adjuvanted  0.5 mL Intramuscular Tomorrow-1000   [MAR Hold] insulin aspart  0-15 Units Subcutaneous TID WC   [MAR Hold] insulin aspart  7 Units Subcutaneous TID WC   [MAR Hold] insulin glargine-yfgn  20 Units Subcutaneous QHS   [MAR  Hold] isosorbide mononitrate  60 mg Oral Daily   [MAR Hold] metoprolol succinate  100 mg Oral Daily   [MAR Hold] sodium chloride flush  3 mL Intravenous Q12H    Infusions:  [MAR Hold] sodium chloride     sodium chloride 10 mL/hr at 03/06/23 0555   [MAR Hold] cefTRIAXone (ROCEPHIN)  IV 200 mL/hr at 03/06/23 0555   heparin Stopped (03/06/23 0916)    PRN Medications: [MAR Hold] sodium chloride, [MAR Hold] acetaminophen, [MAR Hold] nitroGLYCERIN, [MAR Hold] ondansetron (ZOFRAN) IV, [MAR Hold] sodium chloride flush    Patient Profile   Emily Farmer is a 67 year old with a history of DMII, HLD, CKD Stage IIIa, permanent  A Fib, 01/02/2023 AV node ablation  and single chamber PPM.    Admitted with chest pain. Cath showed RA 19, PCWP 36, elevated PA pressures, PVR 2.9, CO 4.7, CI 2.7.   Assessment/Plan   1. Acute on chronic diastolic CHF with prominent RV failure: Echo this admission with EF 60-65% with D-shaped interventricular septum, moderately dilated RV with mild RV dysfunction, mild MR, moderate TR, dilated IVC.  RHC on 9/17 showed mean RA 20, PA 82/28 mean 50, mean PCWP 36 with v-waves to 50, CI 2.57, PVR 2.9 WU => elevated right and left heart filling pressures with severe primarily pulmonary venous hypertension.  She was started on IV lasix, has diuresed and volume status looks better, creatinine higher at 1.79.  RHC today showed significant improvement with normal filling pressures and mild pulmonary hypertension. Prominent v-waves on PCWP tracing, suspect this may be due to diastolic dysfunction as mitral regurgitation only mild on echo.  - Hold Lasix for now with low RA pressure, will likely to need to restart po at some point this weekend.  - Hold off on SGLT2 inhibitor with UTI.  - Hold spironolactone with rise in creatinine.   2. CAD: Unstable angina at presentation, severe 3VD on cath. Diffuse RCA disease with subtotal occlusion and collaterals to PDA from left, 90% mid LAD stenosis,  80% mid LCx stenosis.  She is a diabetic.  Dr. Laneta Simmers has seen for CABG evaluation, he is concerned about her elevated PA pressure and is concerned that he may be unable to graft her RCA/PDA system which is likely the culprit for her presentation. The disease in this vessel is also too extensive to be approached percutaneously.  RHC today showed significant improvement in PA pressure (mild pulmonary hypertension now).  - Continue ASA 81 - Continue statin - Can continue current Toprol XL and Imdur. - Will need to speak with Dr. Laneta Simmers to make final decision on CABG vs PCI. If no CABG, the LAD and LCx stenoses  are focal and could be addressed via PCI next week.  3.  Pulmonary hypertension: Severe pulmonary venous hypertension with PVR only 2.9 WU on 9/17 cath.  Repeat RHC today with mild pulmonary hypertension (significant improvement with diuresis), PVR now 3.3.  OSA may contribute.    4. OSA: Continue CPAP.  5. Atrial fibrillation: Permanent.  S/p AV nodal ablation with PPM (has left bundle lead).  Paced QRS is narrow due to LB lead.   - Stopped diltiazem CD, not needed for rate control given AV nodal ablation.  - Continue heparin gtt.  6. AKI on CKD stage 3: Creatinine  higher than baseline, 1.49 => 1.32 => 1.53 => 1.79 today.  Suspect baseline diabetic nephropathy and now with aggressive diuresis.  - Hold IV Lasix, will need to restart po probably over weekend.   7. Type 2 diabetes: SSI for now.   Emily Farmer 03/06/2023 10:14 AM

## 2023-03-06 NOTE — Hospital Course (Addendum)
Hospital Course:  Dr. Laneta Simmers saw this 67 year old female in cardiothoracic surgical consultation for consideration of coronary artery surgical revascularization (CABG) on 03/04/2023 .  The patient presented to the emergency room on 03/01/2023 with symptoms of chest pain and dyspnea.  She has multiple cardiac risk factors including type 2 diabetes, mixed hyperlipidemia, hypertriglyceridemia, OSA on CPAP, hypertension and atrial fibrillation status post recent ablation and placement of single-chamber PPM.  She reports that since having the pacemaker placed she describes some heaviness in her chest with increasing shortness of breath/DOE.  Simple exercise including such things as walking to the mailbox lead to shortness of breath and chest discomfort.  She was felt to require admission for further diagnostic evaluation including chest CTA and cardiac catheterization.  She has severe hypertriglyceridemia with levels into the 1500s as well as previous history of acute pancreatitis.  She has very poor control of her diabetes with most recent hemoglobin A1c 10.2 but has been greater than 15 with significantly elevated levels dating back to 2013 in epic.  Her previous history of atrial fibrillation has been difficult to control and has undergone AV nodal ablation.  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  Cardiac catheterization revealed severe three-vessel coronary artery disease.  Dr. Laneta Simmers was concerned about her elevated PA pressure and is concerned that he may be unable to graft her RCA/PDA system which is likely the culprit for her presentation. Hemodynamic findings were consistent with severe pulmonary hypertension.  The advanced heart failure team was consulted to assist with optimization of cardiac function and initiation of GDMT.  Troponins have been mildly elevated.  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  Most recent  creatinine is noted to be 1.32.  Previous CT scan showed significant atherosclerosis in the abdominal aorta.  It does not appear the CTA of the chest has been performed yet.  Echocardiogram showed EF of 60 to 65% with no regional wall motion abnormalities, mildly reduced RV function, severely elevated pulmonary artery systolic pressure, mild MR.  Eliquis is currently on hold and she is on a heparin drip.  She underwent a right heart catheterization which showed mild MR seen on echo may be due to diastolic dysfunction and mild pulmonary arterial hypertension (improved from previously). Dr. Laneta Simmers discussed the need for coronary artery bypass grafting surgery. Potential risks, benefits, and complications of the surgery were discussed with the patient and she agreed to proceed with surgery.  Hospital Course: Patient underwent a CABG x 3. She was transferred from the OR to [facility] ICU in stable condition. She was extubated early the evening of surgery. She was weaned off Nor Epinephrine drip. She was AV pacing with temporary wires initially (s/p PPM for tachy brady syndrome, AV node ablation several months ago for a fib). Apixaban will be restarted after EPW removed. Theone Murdoch, a line, chest tubes were removed early in his post operative course. She was transitioned off the Insulin drip. Pre op HGA1C 8. Actos and Jardiance will be restarted soon. She will need close medical follow up after discharge. She was weaned off Nor epinephrine drip on post op day 2. She was volume overloaded and diuresed starting on post op day 2. PT consult was obtained to assist with ambulation. She was started on Midodrine 10 mg tid for labile BP. She had an increase in her creatinine so diuresis was held on post op day 3. EPW and foley were  removed on 09/26. She will be restarted on Apixaban soon. She was felt stable for transfer from the ICU to 4E for further convalescence on 09/26.  Her blood sugars remained elevated and her insulin  regimen was adjusted as indicated.  She was volume overloaded and started on Demadex.  She had a good response to this.  Creatinine on postop day 6 did bump to 1.74 and diuresis was subsequently held although it is felt she will require more going forward.

## 2023-03-06 NOTE — Discharge Instructions (Signed)

## 2023-03-06 NOTE — Progress Notes (Signed)
ANTICOAGULATION CONSULT NOTE   Pharmacy Consult for Heparin Indication:  ACS/STEMI; afib on Eliquis  Allergies  Allergen Reactions   Trulicity [Dulaglutide] Diarrhea    Patient Measurements: Height: 5\' 3"  (160 cm) Weight: 74.7 kg (164 lb 11.2 oz) IBW/kg (Calculated) : 52.4 Heparin Dosing Weight: 70.5 kg  Vital Signs: Temp: 97.6 F (36.4 C) (09/20 1023) Temp Source: Oral (09/20 1023) BP: 113/67 (09/20 1023) Pulse Rate: 62 (09/20 1023)  Labs: Recent Labs    03/03/23 1302 03/03/23 1505 03/03/23 1511 03/04/23 0644 03/04/23 1827 03/05/23 0426 03/05/23 1157 03/06/23 0810  HGB 9.5* 10.5* 10.9*  10.5*  --   --   --   --   --   HCT 30.6* 31.0* 32.0*  31.0*  --   --   --   --   --   PLT 165  --   --   --   --   --   --   --   APTT  --   --   --  54*   < > 119* 89* 144*  HEPARINUNFRC  --   --   --  >1.10*  --  >1.10*  --  >1.10*  CREATININE  --   --   --  1.32*  --  1.59*  --  1.79*   < > = values in this interval not displayed.    Estimated Creatinine Clearance: 30.3 mL/min (A) (by C-G formula based on SCr of 1.79 mg/dL (H)).   Medical History: Past Medical History:  Diagnosis Date   Arthritis    Atrial fibrillation (HCC)    a. 09/2016 in setting of pancreatitis;  b. 09/2016 Echo: EF 65-60%, no rwma, Gr2 DD, mild MR, triv TR, PASP ;  CHA2DS2VASc = 4-->Eliquis 5mg  BID. c. Recurred 06/2018 in setting of GIB.   Chronic diastolic CHF (congestive heart failure) (HCC) 01/28/2018   Diabetes mellitus    GI bleeding 06/2018   Gout    Hyperlipidemia    Hypertension    Hypertriglyceridemia    Pancreatitis    a. 09/2016 - Triglycerides 1,392 on admission.    Medications:  Scheduled:   aspirin EC  81 mg Oral Daily   atorvastatin  40 mg Oral Daily   fenofibrate  160 mg Oral Daily   ferrous sulfate  325 mg Oral Q breakfast   influenza vaccine adjuvanted  0.5 mL Intramuscular Tomorrow-1000   insulin aspart  0-15 Units Subcutaneous TID WC   insulin aspart  7 Units  Subcutaneous TID WC   insulin glargine-yfgn  20 Units Subcutaneous QHS   isosorbide mononitrate  60 mg Oral Daily   metoprolol succinate  100 mg Oral Daily   sodium chloride flush  3 mL Intravenous Q12H   sodium chloride flush  3 mL Intravenous Q12H   Infusions:   sodium chloride     sodium chloride     cefTRIAXone (ROCEPHIN)  IV 200 mL/hr at 03/06/23 0555   heparin Stopped (03/06/23 0916)   PRN: sodium chloride, sodium chloride, acetaminophen, hydrALAZINE, labetalol, nitroGLYCERIN, ondansetron (ZOFRAN) IV, sodium chloride flush, sodium chloride flush  Assessment: 67 yo female on chronic Eliquis for afib. 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 Pharmacy consulted to start IV heparin while undergoing assessment for potential multivessel PCI versus CABG. Since on Eliquis, will utilize aPTT for monitoring heparin  -aPTT 144 this am but has previously been at goal -heparin was stopped this am for RHC  Goal of Therapy:  Heparin level 0.3-0.7 units/ml  aPTT 66-102 sec Monitor platelets by anticoagulation protocol: Yes   Plan:  -restart heparin 1150 units/hr -aPTT in 8 hrs  Harland German, PharmD Clinical Pharmacist **Pharmacist phone directory can now be found on [email or web address] ([email or web address]).  Listed under [facility].

## 2023-03-07 DIAGNOSIS — I200 Unstable angina: Secondary | ICD-10-CM | POA: Diagnosis not present

## 2023-03-07 DIAGNOSIS — I5031 Acute diastolic (congestive) heart failure: Secondary | ICD-10-CM

## 2023-03-07 LAB — SURGICAL PCR SCREEN
MRSA, PCR: NEGATIVE
Staphylococcus aureus: NEGATIVE

## 2023-03-07 LAB — CBC
HCT: 33.9 % — ABNORMAL LOW (ref 36.0–46.0)
Hemoglobin: 11 g/dL — ABNORMAL LOW (ref 12.0–15.0)
MCH: 28.6 pg (ref 26.0–34.0)
MCHC: 32.4 g/dL (ref 30.0–36.0)
MCV: 88.3 fL (ref 80.0–100.0)
Platelets: 219 10*3/uL (ref 150–400)
RBC: 3.84 MIL/uL — ABNORMAL LOW (ref 3.87–5.11)
RDW: 15.8 % — ABNORMAL HIGH (ref 11.5–15.5)
WBC: 5.5 10*3/uL (ref 4.0–10.5)
nRBC: 0 % (ref 0.0–0.2)

## 2023-03-07 LAB — APTT
aPTT: 108 seconds — ABNORMAL HIGH (ref 24–36)
aPTT: 108 seconds — ABNORMAL HIGH (ref 24–36)

## 2023-03-07 LAB — GLUCOSE, CAPILLARY
Glucose-Capillary: 228 mg/dL — ABNORMAL HIGH (ref 70–99)
Glucose-Capillary: 77 mg/dL (ref 70–99)
Glucose-Capillary: 167 mg/dL — ABNORMAL HIGH (ref 70–99)
Glucose-Capillary: 202 mg/dL — ABNORMAL HIGH (ref 70–99)

## 2023-03-07 LAB — BASIC METABOLIC PANEL
Anion gap: 10 (ref 5–15)
BUN: 45 mg/dL — ABNORMAL HIGH (ref 8–23)
CO2: 24 mmol/L (ref 22–32)
Calcium: 8.8 mg/dL — ABNORMAL LOW (ref 8.9–10.3)
Chloride: 95 mmol/L — ABNORMAL LOW (ref 98–111)
Creatinine, Ser: 1.71 mg/dL — ABNORMAL HIGH (ref 0.44–1.00)
GFR, Estimated: 33 mL/min — ABNORMAL LOW (ref >60)
Glucose, Bld: 250 mg/dL — ABNORMAL HIGH (ref 70–99)
Potassium: 3.9 mmol/L (ref 3.5–5.1)
Sodium: 132 mmol/L — ABNORMAL LOW (ref 135–145)

## 2023-03-07 LAB — HEPARIN LEVEL (UNFRACTIONATED)
Heparin Unfractionated: 0.82 IU/mL — ABNORMAL HIGH (ref 0.30–0.70)
Heparin Unfractionated: 0.75 IU/mL — ABNORMAL HIGH (ref 0.30–0.70)
Heparin Unfractionated: 0.49 IU/mL (ref 0.30–0.70)

## 2023-03-07 NOTE — Progress Notes (Signed)
CARDIAC REHAB PHASE I   PRE:  Rate/Rhythm: 61 NSR  BP:  Sitting: 112/74       MODE:  Ambulation: 250 ft   POST:  Rate/Rhythm: 76 NSR  BP:  Sitting: 128/82        Pt ambulated in the hallway independently using the IV pole for stability. Pt tolerated ambulation well and amb 250 ft. Pt denies CP, SOB, or dizziness throughout walk. Pt placed back in the bed with call bell in reach.  Pt's pre-op education was done which consisted of IS use, sternal precautions and OHS booklet was given. Pt states her sister will be staying with her after surgery.   [id number] Guss Bunde, RRT 03/07/2023 9:55 AM

## 2023-03-07 NOTE — Progress Notes (Signed)
ANTICOAGULATION CONSULT NOTE   Pharmacy Consult for Heparin Indication:  ACS/STEMI; afib on Eliquis  Allergies  Allergen Reactions   Trulicity [Dulaglutide] Diarrhea    Patient Measurements: Height: 5\' 3"  (160 cm) Weight: 75.2 kg (165 lb 11.2 oz) IBW/kg (Calculated) : 52.4 Heparin Dosing Weight: 70.5 kg  Vital Signs: Temp: 98.7 F (37.1 C) (09/21 0411) Temp Source: Oral (09/21 0411) BP: 91/60 (09/20 1957) Pulse Rate: 77 (09/21 0411)  Labs: Recent Labs    03/05/23 0426 03/05/23 1157 03/06/23 0810 03/06/23 1005 03/06/23 2032 03/07/23 0433  HGB  --   --   --  11.9*  11.9*  --  11.0*  HCT  --   --   --  35.0*  35.0*  --  33.9*  PLT  --   --   --   --   --  219  APTT 119*   < > 144*  --  79* 108*  HEPARINUNFRC >1.10*  --  >1.10*  --   --  0.82*  CREATININE 1.59*  --  1.79*  --   --  1.71*   < > = values in this interval not displayed.    Estimated Creatinine Clearance: 31.8 mL/min (A) (by C-G formula based on SCr of 1.71 mg/dL (H)).   Assessment: 67 yo female on chronic Eliquis for afib. 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 Pharmacy consulted to start IV heparin while undergoing assessment for potential multivessel PCI versus CABG. Noted plan for possible CABG on 9/23. Since on Eliquis, will utilize aPTT for monitoring heparin.   This morning, aPTT is supratherapeutic at 108 on 1250 units/hr. Additionally, heparin level has become detectable and is supratherapeutic at 0.82. aPTT and heparin levels starting to correlate. Hgb stable at 11 and PLT WNL. No issues with infusion and no bleeding reported.  Goal of Therapy:  Heparin level 0.3-0.7 units/ml aPTT 66-102 sec Monitor platelets by anticoagulation protocol: Yes   Plan:  -Decrease heparin drip to 1150 units/hr -Obtain aPTT and heparin level in 6 hours -Daily aPTT, heparin level, CBC -Monitor for s/sx of bleeding  Thank you for involving pharmacy in this patient's care.  Lennie Muckle, PharmD PGY1  Pharmacy Resident 03/07/2023 7:31 AM

## 2023-03-07 NOTE — Progress Notes (Signed)
Mobility Specialist Progress Note:   03/07/23 1100  Mobility  Activity Ambulated independently in hallway  Level of Assistance Modified independent, requires aide device or extra time  Assistive Device None  Distance Ambulated (ft) 400 ft  Activity Response Tolerated well  Mobility Referral Yes  $Mobility charge 1 Mobility  Mobility Specialist Start Time (ACUTE ONLY) 1030  Mobility Specialist Stop Time (ACUTE ONLY) 1038  Mobility Specialist Time Calculation (min) (ACUTE ONLY) 8 min    Pre Mobility: 62 HR During Mobility: 93 HR Post Mobility:  90 HR  Pt received in bed, agreeable to mobility. Aymptomatic throughout w/ no complaints. Pt left in bed with call bell and all needs met.  D'Vante Earlene Plater Mobility Specialist Please contact via Special educational needs teacher or Rehab office at [phone]

## 2023-03-07 NOTE — Progress Notes (Signed)
Progress Note  Patient Name: Emily Farmer Date of Encounter: 03/07/2023  Primary Cardiologist: Chrystie Nose, MD  Interval Summary   Chart reviewed including follow-up note by Dr. Laneta Simmers from yesterday.  Patient eating lunch.  Reports no shortness of breath or chest pain at rest.  No palpitations.  Vital Signs    Vitals:   03/06/23 1957 03/07/23 0411 03/07/23 0735 03/07/23 1117  BP: 91/60  112/74 (!) 100/58  Pulse:  77 60   Resp: 20 18 18 18   Temp: 98.5 F (36.9 C) 98.7 F (37.1 C) (!) 97.5 F (36.4 C) 97.6 F (36.4 C)  TempSrc: Oral Oral Oral Oral  SpO2:  94% 94%   Weight:  75.2 kg    Height:        Intake/Output Summary (Last 24 hours) at 03/07/2023 1208 Last data filed at 03/07/2023 0846 Gross per 24 hour  Intake 2445.87 ml  Output 2025 ml  Net 420.87 ml   Filed Weights   03/05/23 0411 03/06/23 0530 03/07/23 0411  Weight: 76.7 kg 74.7 kg 75.2 kg    Physical Exam   GEN: No acute distress.   Neck: No JVD. Cardiac: RRR, no murmur, rub, or gallop.  Respiratory: Nonlabored. Clear to auscultation bilaterally. GI: Soft, nontender, bowel sounds present. MS: No edema. Neuro:  Nonfocal. Psych: Alert and oriented x 3. Normal affect.  ECG/Telemetry    Sinus rhythm.  Labs    Chemistry Recent Labs  Lab 03/05/23 0426 03/06/23 0810 03/06/23 1005 03/07/23 0433  NA 133* 136 137  137 132*  K 3.7 4.0 3.8  3.8 3.9  CL 96* 95*  --  95*  CO2 25 29  --  24  GLUCOSE 146* 151*  --  250*  BUN 48* 50*  --  45*  CREATININE 1.59* 1.79*  --  1.71*  CALCIUM 8.7* 9.0  --  8.8*  GFRNONAA 36* 31*  --  33*  ANIONGAP 12 12  --  10    Hematology Recent Labs  Lab 03/01/23 0954 03/03/23 1302 03/03/23 1505 03/03/23 1511 03/05/23 0426 03/06/23 1005 03/07/23 0433  WBC 6.9 6.1  --   --   --   --  5.5  RBC 3.62* 3.39*  --   --  3.69*  --  3.84*  HGB 10.6* 9.5*   < > 10.9*  10.5*  --  11.9*  11.9* 11.0*  HCT 33.5* 30.6*   < > 32.0*  31.0*  --  35.0*  35.0*  33.9*  MCV 92.5 90.3  --   --   --   --  88.3  MCH 29.3 28.0  --   --   --   --  28.6  MCHC 31.6 31.0  --   --   --   --  32.4  RDW 15.6* 15.7*  --   --   --   --  15.8*  PLT 164 165  --   --   --   --  219   < > = values in this interval not displayed.   Cardiac Enzymes Recent Labs  Lab 03/01/23 0954 03/01/23 1246  TROPONINIHS 19* 29*   Lipid Panel     Component Value Date/Time   CHOL 95 03/02/2023 0422   CHOL 351 (H) 11/18/2022 1216   TRIG 121 03/02/2023 0422   HDL 39 (L) 03/02/2023 0422   HDL 31 (L) 11/18/2022 1216   CHOLHDL 2.4 03/02/2023 0422   VLDL 24 03/02/2023 0422  LDLCALC 32 03/02/2023 0422   LDLCALC Comment (A) 11/18/2022 1216   LDLDIRECT 137 (H) 03/01/2020 1012   LDLDIRECT 96.0 09/23/2019 0828   LABVLDL Comment (A) 11/18/2022 1216    Cardiac Studies   RHC (9/20):  Hemodynamics (mmHg) RA mean 3 RV 47/4 PA 47/11, mean 28 PCWP mean 14 with v-waves to 26 (prominent V-waves) Oxygen saturations: PA 61% AO 100% Cardiac Output (Fick) 4.21  Cardiac Index (Fick) 2.37 PVR 3.3 WU   Assessment & Plan   1.  Acute on chronic HFpEF, RV predominant.  Had severe primarily pulmonary venous hypertension that improved with diuresis.  Right heart catheterization from September 20 noted above.  Diuretics held given low right atrial pressure and renal insufficiency with plan to possibly resume oral regimen over the weekend.  Also not on SGLT2 inhibitor with UTI no Aldactone with current renal insufficiency.  2.  Multivessel CAD presenting with unstable angina. Diffuse RCA disease with subtotal occlusion and collaterals to PDA from left, 90% mid LAD stenosis, 80% mid LCx stenosis.  Patient reevaluated by Dr. Laneta Simmers yesterday, tentative plan is for CABG next week.  3.  OSA on CPAP.  4.  Permanent atrial fibrillation status post AV node ablation and pacing.  Continues on IV heparin for now.  No longer on Cardizem CD.  5.  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  Continue  to hold diuretics for now.  Net urine output 500 cc last 24 hours.  Continue aspirin, Lipitor, fenofibrate, Imdur, IV heparin and Toprol-XL.  Recheck BMET in a.m.  For questions or updates, please contact [facility] Please consult [email or web address] for contact info under   Signed, Nona Dell, MD  03/07/2023, 12:08 PM

## 2023-03-07 NOTE — Progress Notes (Signed)
ANTICOAGULATION CONSULT NOTE   Pharmacy Consult for Heparin Indication:  ACS/STEMI; afib on Eliquis  Allergies  Allergen Reactions   Trulicity [Dulaglutide] Diarrhea    Patient Measurements: Height: 5\' 3"  (160 cm) Weight: 75.2 kg (165 lb 11.2 oz) IBW/kg (Calculated) : 52.4 Heparin Dosing Weight: 70.5 kg  Vital Signs: Temp: 97.5 F (36.4 C) (09/21 1510) Temp Source: Oral (09/21 1510) BP: 102/64 (09/21 1510) Pulse Rate: 60 (09/21 0735)  Labs: Recent Labs    03/05/23 0426 03/05/23 1157 03/06/23 0810 03/06/23 1005 03/06/23 2032 03/07/23 0433 03/07/23 1506  HGB  --   --   --  11.9*  11.9*  --  11.0*  --   HCT  --   --   --  35.0*  35.0*  --  33.9*  --   PLT  --   --   --   --   --  219  --   APTT 119*   < > 144*  --  79* 108* 108*  HEPARINUNFRC >1.10*  --  >1.10*  --   --  0.82* 0.75*  CREATININE 1.59*  --  1.79*  --   --  1.71*  --    < > = values in this interval not displayed.    Estimated Creatinine Clearance: 31.8 mL/min (A) (by C-G formula based on SCr of 1.71 mg/dL (H)).   Assessment: 67 yo female on chronic Eliquis for afib. 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 Pharmacy consulted to start IV heparin while undergoing assessment for potential multivessel PCI versus CABG. Noted plan for possible CABG on 9/23. Since on Eliquis, will utilize aPTT for monitoring heparin.   This morning, aPTT is supratherapeutic at 108 on 1250 units/hr. Additionally, heparin level has become detectable and is supratherapeutic at 0.82. aPTT and heparin levels starting to correlate. Hgb stable at 11 and PLT WNL. No issues with infusion and no bleeding reported.  9/21 PM update: aPTT 108 seconds- supra-therapeutic HL 0.75- supra-therapeutic No signs of bleeding or issues with the heparin gtt aPTT / HL correlate- DC aPTT's   Goal of Therapy:  Heparin level 0.3-0.7 units/ml aPTT 66-102 sec Monitor platelets by anticoagulation protocol: Yes   Plan:  -Decrease heparin  drip to 1000 units/hr -Obtain heparin level in 8 hours -Daily heparin level, CBC -Monitor for s/sx of bleeding  Thank you for involving pharmacy in this patient's care.  Greta Doom BS, PharmD, BCPS Clinical Pharmacist 03/07/2023 4:22 PM  Contact: [phone] after 3 PM  "Be curious, not judgmental..." -Debbora Dus

## 2023-03-08 ENCOUNTER — Inpatient Hospital Stay (HOSPITAL_COMMUNITY): Payer: Medicare HMO

## 2023-03-08 DIAGNOSIS — Z01810 Encounter for preprocedural cardiovascular examination: Secondary | ICD-10-CM

## 2023-03-08 DIAGNOSIS — I2510 Atherosclerotic heart disease of native coronary artery without angina pectoris: Secondary | ICD-10-CM | POA: Diagnosis not present

## 2023-03-08 DIAGNOSIS — I200 Unstable angina: Secondary | ICD-10-CM | POA: Diagnosis not present

## 2023-03-08 DIAGNOSIS — I5033 Acute on chronic diastolic (congestive) heart failure: Secondary | ICD-10-CM | POA: Diagnosis not present

## 2023-03-08 LAB — URINALYSIS, ROUTINE W REFLEX MICROSCOPIC
Bilirubin Urine: NEGATIVE
Glucose, UA: NEGATIVE mg/dL
Hgb urine dipstick: NEGATIVE
Ketones, ur: NEGATIVE mg/dL
Leukocytes,Ua: NEGATIVE
Nitrite: NEGATIVE
Protein, ur: NEGATIVE mg/dL
Specific Gravity, Urine: 1.004 — ABNORMAL LOW (ref 1.005–1.030)
pH: 5 (ref 5.0–8.0)

## 2023-03-08 LAB — BASIC METABOLIC PANEL
Anion gap: 13 (ref 5–15)
BUN: 51 mg/dL — ABNORMAL HIGH (ref 8–23)
CO2: 25 mmol/L (ref 22–32)
Calcium: 9.1 mg/dL (ref 8.9–10.3)
Chloride: 96 mmol/L — ABNORMAL LOW (ref 98–111)
Creatinine, Ser: 1.48 mg/dL — ABNORMAL HIGH (ref 0.44–1.00)
GFR, Estimated: 39 mL/min — ABNORMAL LOW (ref >60)
Glucose, Bld: 125 mg/dL — ABNORMAL HIGH (ref 70–99)
Potassium: 3.7 mmol/L (ref 3.5–5.1)
Sodium: 134 mmol/L — ABNORMAL LOW (ref 135–145)

## 2023-03-08 LAB — GLUCOSE, CAPILLARY
Glucose-Capillary: 223 mg/dL — ABNORMAL HIGH (ref 70–99)
Glucose-Capillary: 80 mg/dL (ref 70–99)
Glucose-Capillary: 191 mg/dL — ABNORMAL HIGH (ref 70–99)
Glucose-Capillary: 81 mg/dL (ref 70–99)

## 2023-03-08 LAB — CBC
HCT: 33.8 % — ABNORMAL LOW (ref 36.0–46.0)
Hemoglobin: 10.8 g/dL — ABNORMAL LOW (ref 12.0–15.0)
MCH: 27.9 pg (ref 26.0–34.0)
MCHC: 32 g/dL (ref 30.0–36.0)
MCV: 87.3 fL (ref 80.0–100.0)
Platelets: 200 10*3/uL (ref 150–400)
RBC: 3.87 MIL/uL (ref 3.87–5.11)
RDW: 15.6 % — ABNORMAL HIGH (ref 11.5–15.5)
WBC: 4.9 10*3/uL (ref 4.0–10.5)
nRBC: 0 % (ref 0.0–0.2)

## 2023-03-08 LAB — VAS US DOPPLER PRE CABG

## 2023-03-08 LAB — HEPARIN LEVEL (UNFRACTIONATED): Heparin Unfractionated: 0.42 IU/mL (ref 0.30–0.70)

## 2023-03-08 LAB — SARS CORONAVIRUS 2 (TAT 6-24 HRS): SARS Coronavirus 2: NEGATIVE

## 2023-03-08 LAB — TYPE AND SCREEN
ABO/RH(D): O POS
Antibody Screen: NEGATIVE

## 2023-03-08 MED ORDER — METOPROLOL TARTRATE 12.5 MG HALF TABLET
12.5000 mg | ORAL_TABLET | Freq: Once | ORAL | Status: AC
  Administered 2023-03-09: 12.5 mg via ORAL
  Filled 2023-03-08: qty 1

## 2023-03-08 MED ORDER — CHLORHEXIDINE GLUCONATE CLOTH 2 % EX PADS
6.0000 | MEDICATED_PAD | Freq: Once | CUTANEOUS | Status: AC
  Administered 2023-03-09: 6 via TOPICAL

## 2023-03-08 MED ORDER — CHLORHEXIDINE GLUCONATE 0.12 % MT SOLN
15.0000 mL | Freq: Once | OROMUCOSAL | Status: AC
  Administered 2023-03-09: 15 mL via OROMUCOSAL
  Filled 2023-03-08: qty 15

## 2023-03-08 MED ORDER — CEFAZOLIN SODIUM-DEXTROSE 2-4 GM/100ML-% IV SOLN
2.0000 g | INTRAVENOUS | Status: DC
  Filled 2023-03-08: qty 100

## 2023-03-08 MED ORDER — CHLORHEXIDINE GLUCONATE CLOTH 2 % EX PADS
6.0000 | MEDICATED_PAD | Freq: Once | CUTANEOUS | Status: AC
  Administered 2023-03-08: 6 via TOPICAL

## 2023-03-08 MED ORDER — CEFAZOLIN SODIUM-DEXTROSE 2-4 GM/100ML-% IV SOLN
2.0000 g | INTRAVENOUS | Status: AC
  Administered 2023-03-09: 2 g via INTRAVENOUS
  Filled 2023-03-08: qty 100

## 2023-03-08 MED ORDER — TEMAZEPAM 7.5 MG PO CAPS: 15.0000 mg | ORAL_CAPSULE | Freq: Once | ORAL | Status: DC | PRN

## 2023-03-08 MED ORDER — BISACODYL 5 MG PO TBEC: 5.0000 mg | DELAYED_RELEASE_TABLET | Freq: Once | ORAL | Status: DC

## 2023-03-08 MED ORDER — FUROSEMIDE 40 MG PO TABS
40.0000 mg | ORAL_TABLET | Freq: Every day | ORAL | Status: DC
  Administered 2023-03-08: 40 mg via ORAL
  Filled 2023-03-08: qty 1

## 2023-03-08 MED ORDER — DIAZEPAM 5 MG PO TABS
5.0000 mg | ORAL_TABLET | Freq: Once | ORAL | Status: AC
  Administered 2023-03-09: 5 mg via ORAL
  Filled 2023-03-08: qty 1

## 2023-03-08 NOTE — Progress Notes (Signed)
Mobility Specialist Progress Note:   03/08/23 1100  Mobility  Activity Ambulated with assistance in hallway  Level of Assistance Modified independent, requires aide device or extra time  Assistive Device Other (Comment) (IV Pole)  Distance Ambulated (ft) 400 ft  Activity Response Tolerated well  Mobility Referral Yes  $Mobility charge 1 Mobility  Mobility Specialist Start Time (ACUTE ONLY) 1043  Mobility Specialist Stop Time (ACUTE ONLY) 1054  Mobility Specialist Time Calculation (min) (ACUTE ONLY) 11 min    Pre Mobility: 71 HR During Mobility: 90 HR Post Mobility:  87 HR  Pt received in bed, agreeable to mobility. Asymptomatic throughout w/ no complaints. Pt left in bed with call bell and all needs met.  D'Vante Earlene Plater Mobility Specialist Please contact via Special educational needs teacher or Rehab office at [phone]

## 2023-03-08 NOTE — Progress Notes (Signed)
ANTICOAGULATION CONSULT NOTE   Pharmacy Consult for Heparin Indication:  ACS/STEMI; afib on Eliquis  Allergies  Allergen Reactions   Trulicity [Dulaglutide] Diarrhea    Patient Measurements: Height: 5\' 3"  (160 cm) Weight: 75.5 kg (166 lb 6.4 oz) IBW/kg (Calculated) : 52.4 Heparin Dosing Weight: 70.5 kg  Vital Signs: Temp: 97.7 F (36.5 C) (09/22 1222) Temp Source: Oral (09/22 1222) BP: 115/66 (09/22 1222) Pulse Rate: 62 (09/22 1222)  Labs: Recent Labs    03/06/23 0810 03/06/23 0810 03/06/23 1005 03/06/23 2032 03/07/23 0433 03/07/23 1506 03/07/23 2220 03/08/23 0443 03/08/23 1214  HGB  --    < > 11.9*  11.9*  --  11.0*  --   --  10.8*  --   HCT  --   --  35.0*  35.0*  --  33.9*  --   --  33.8*  --   PLT  --   --   --   --  219  --   --  200  --   APTT 144*  --   --  79* 108* 108*  --   --   --   HEPARINUNFRC >1.10*  --   --   --  0.82* 0.75* 0.49  --  0.42  CREATININE 1.79*  --   --   --  1.71*  --   --  1.48*  --    < > = values in this interval not displayed.    Estimated Creatinine Clearance: 36.9 mL/min (A) (by C-G formula based on SCr of 1.48 mg/dL (H)).   Assessment: 67 yo female on chronic Eliquis for afib. 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 Pharmacy consulted to start IV heparin while undergoing assessment for potential multivessel PCI versus CABG. Noted plan for CABG on 9/23. Since on Eliquis, will utilize aPTT for monitoring heparin.   Heparin level was therapeutic overnight at 0.49 on 1000 units/hr. An 8 hour level was supposed to be obtained, but due to the patient being off the floor at the time of the original lab draw, the heparin level was drawn late. 14 hour heparin level was therapeutic at 0.42. No issues with infusion and no bleeding reported.   Goal of Therapy:  Heparin level 0.3-0.7 units/ml Monitor platelets by anticoagulation protocol: Yes   Plan:  -Decrease heparin drip to 1000 units/hr -Daily heparin level and CBC while on  heparin -Monitor for s/sx of bleeding  Thank you for involving pharmacy in this patient's care.  Lennie Muckle, PharmD PGY1 Pharmacy Resident 03/08/2023 12:40 PM

## 2023-03-08 NOTE — Progress Notes (Signed)
VASCULAR LAB    Pre CABG Dopplers have been performed.  See CV proc for preliminary results.   Ankita Newcomer, RVT 03/08/2023, 9:17 AM

## 2023-03-08 NOTE — Progress Notes (Signed)
2 Days Post-Op Procedure(s) (LRB): RIGHT HEART CATH (N/A) Subjective: No complaints  Objective: Vital signs in last 24 hours: Temp:  [97.5 F (36.4 C)-98.2 F (36.8 C)] 98.2 F (36.8 C) (09/22 0356) Pulse Rate:  [59-61] 61 (09/22 1005) Cardiac Rhythm: Ventricular paced (09/22 0800) Resp:  [16-20] 20 (09/22 0356) BP: (96-128)/(58-72) 113/72 (09/22 1005) SpO2:  [95 %-98 %] 95 % (09/22 0356) Weight:  [75.5 kg] 75.5 kg (09/22 0653)  Hemodynamic parameters for last 24 hours:    Intake/Output from previous day: 09/21 0701 - 09/22 0700 In: 1257.3 [P.O.:960; I.V.:297.3] Out: 900 [Urine:900] Intake/Output this shift: Total I/O In: 10 [I.V.:10] Out: -   General appearance: alert and cooperative Heart: regular rate and rhythm Lungs: clear to auscultation bilaterally  Lab Results: Recent Labs    03/07/23 0433 03/08/23 0443  WBC 5.5 4.9  HGB 11.0* 10.8*  HCT 33.9* 33.8*  PLT 219 200   BMET:  Recent Labs    03/07/23 0433 03/08/23 0443  NA 132* 134*  K 3.9 3.7  CL 95* 96*  CO2 24 25  GLUCOSE 250* 125*  BUN 45* 51*  CREATININE 1.71* 1.48*  CALCIUM 8.8* 9.1    PT/INR: No results for input(s): "LABPROT", "INR" in the last 72 hours. ABG    Component Value Date/Time   PHART 7.343 (L) 03/03/2023 1505   HCO3 29.8 (H) 03/06/2023 1005   HCO3 29.9 (H) 03/06/2023 1005   TCO2 31 03/06/2023 1005   TCO2 31 03/06/2023 1005   ACIDBASEDEF 6.0 (H) 03/03/2023 1511   ACIDBASEDEF 6.0 (H) 03/03/2023 1511   O2SAT 63 03/06/2023 1005   O2SAT 59 03/06/2023 1005   CBG (last 3)  Recent Labs    03/07/23 1608 03/07/23 2047 03/08/23 0758  GLUCAP 167* 202* 223*    Assessment/Plan:  Creat improved to 1.48. Stable for CABG tomorrow. She has no further questions. Orders written.  LOS: 4 days    [name] 03/08/2023

## 2023-03-08 NOTE — Progress Notes (Signed)
ANTICOAGULATION CONSULT NOTE  Pharmacy Consult for Heparin Indication: atrial fibrillation Brief A/P: Heparin level within goal range Continue Heparin at current rate   Allergies  Allergen Reactions   Trulicity [Dulaglutide] Diarrhea    Patient Measurements: Height: 5\' 3"  (160 cm) Weight: 75.2 kg (165 lb 11.2 oz) IBW/kg (Calculated) : 52.4 Heparin Dosing Weight: 70.5 kg  Vital Signs: Temp: 97.8 F (36.6 C) (09/21 2032) Temp Source: Oral (09/21 2032) BP: 96/58 (09/21 2032) Pulse Rate: 59 (09/21 2032)  Labs: Recent Labs    03/05/23 0426 03/05/23 1157 03/06/23 0810 03/06/23 1005 03/06/23 2032 03/07/23 0433 03/07/23 1506 03/07/23 2220  HGB  --   --   --  11.9*  11.9*  --  11.0*  --   --   HCT  --   --   --  35.0*  35.0*  --  33.9*  --   --   PLT  --   --   --   --   --  219  --   --   APTT 119*   < > 144*  --  79* 108* 108*  --   HEPARINUNFRC >1.10*  --  >1.10*  --   --  0.82* 0.75* 0.49  CREATININE 1.59*  --  1.79*  --   --  1.71*  --   --    < > = values in this interval not displayed.    Estimated Creatinine Clearance: 31.8 mL/min (A) (by C-G formula based on SCr of 1.71 mg/dL (H)).   Assessment: 67 y.o. female with h/o Afib, Eliquis on hold, for heparin  Goal of Therapy:  Heparin level 0.3-0.7 units/ml Monitor platelets by anticoagulation protocol: Yes   Plan:  No change to heparin   Geannie Risen, PharmD, BCPS

## 2023-03-08 NOTE — Anesthesia Preprocedure Evaluation (Signed)
Anesthesia Evaluation  Patient identified by MRN, date of birth, ID band Patient awake    Reviewed: Allergy & Precautions, NPO status , Patient's Chart, lab work & pertinent test results, reviewed documented beta blocker date and time   History of Anesthesia Complications Negative for: history of anesthetic complications  Airway Mallampati: I  TM Distance: >3 FB Neck ROM: Full    Dental  (+) Edentulous Upper, Edentulous Lower   Pulmonary neg pulmonary ROS   breath sounds clear to auscultation       Cardiovascular hypertension, Pt. on medications and Pt. on home beta blockers pulmonary hypertension+ angina  + CAD and + Peripheral Vascular Disease  + dysrhythmias Atrial Fibrillation + pacemaker + Valvular Problems/Murmurs (mild) MR  Rhythm:Regular Rate:Normal  03/02/2023 ECHO: EF 60-65%, normal LVF, mildly reduced RVF with RV dilation and severe pulm HTN, mild MR   Neuro/Psych    Depression    negative neurological ROS     GI/Hepatic negative GI ROS, Neg liver ROS,,,  Endo/Other  diabetes (glu 206), Oral Hypoglycemic Agents, Insulin Dependent    Renal/GU Renal hypertensionRenal disease     Musculoskeletal   Abdominal   Peds  Hematology  (+) Blood dyscrasia (Hb 10.8, plt 200k) Eliquis   Anesthesia Other Findings   Reproductive/Obstetrics                             Anesthesia Physical Anesthesia Plan  ASA: 4  Anesthesia Plan: General   Post-op Pain Management:    Induction: Intravenous  PONV Risk Score and Plan: 3 and Treatment may vary due to age or medical condition  Airway Management Planned: Oral ETT  Additional Equipment: Arterial line, PA Cath, TEE and Ultrasound Guidance Line Placement  Intra-op Plan:   Post-operative Plan: Post-operative intubation/ventilation  Informed Consent: I have reviewed the patients History and Physical, chart, labs and discussed the procedure  including the risks, benefits and alternatives for the proposed anesthesia with the patient or authorized representative who has indicated his/her understanding and acceptance.       Plan Discussed with: CRNA and Surgeon  Anesthesia Plan Comments:         Anesthesia Quick Evaluation

## 2023-03-08 NOTE — Progress Notes (Signed)
Progress Note  Patient Name: Emily Farmer Date of Encounter: 03/08/2023  Primary Cardiologist: Chrystie Nose, MD  Interval Summary   Chart reviewed.  No acute events overnight.  She reports no chest pain or shortness of breath.  Vital Signs    Vitals:   03/08/23 0356 03/08/23 0500 03/08/23 0653 03/08/23 1005  BP: 128/65   113/72  Pulse: 60   61  Resp: 20     Temp: 98.2 F (36.8 C)     TempSrc: Oral     SpO2: 95%     Weight:  75.5 kg 75.5 kg   Height:        Intake/Output Summary (Last 24 hours) at 03/08/2023 1036 Last data filed at 03/08/2023 1007 Gross per 24 hour  Intake 307.25 ml  Output 700 ml  Net -392.75 ml   Filed Weights   03/07/23 0411 03/08/23 0500 03/08/23 0653  Weight: 75.2 kg 75.5 kg 75.5 kg    Physical Exam   GEN: No acute distress.   Neck: No JVD. Cardiac: RRR, no murmur, rub, or gallop.  Respiratory: Nonlabored. Clear to auscultation bilaterally. GI: Soft, nontender, bowel sounds present. MS: No edema. Neuro:  Nonfocal. Psych: Alert and oriented x 3. Normal affect.  ECG/Telemetry    Telemetry reviewed showing atrial fibrillation and ventricular pacing.  Labs    Chemistry Recent Labs  Lab 03/06/23 0810 03/06/23 1005 03/07/23 0433 03/08/23 0443  NA 136 137  137 132* 134*  K 4.0 3.8  3.8 3.9 3.7  CL 95*  --  95* 96*  CO2 29  --  24 25  GLUCOSE 151*  --  250* 125*  BUN 50*  --  45* 51*  CREATININE 1.79*  --  1.71* 1.48*  CALCIUM 9.0  --  8.8* 9.1  GFRNONAA 31*  --  33* 39*  ANIONGAP 12  --  10 13    Hematology Recent Labs  Lab 03/03/23 1302 03/03/23 1505 03/05/23 0426 03/06/23 1005 03/07/23 0433 03/08/23 0443  WBC 6.1  --   --   --  5.5 4.9  RBC 3.39*  --  3.69*  --  3.84* 3.87  HGB 9.5*   < >  --  11.9*  11.9* 11.0* 10.8*  HCT 30.6*   < >  --  35.0*  35.0* 33.9* 33.8*  MCV 90.3  --   --   --  88.3 87.3  MCH 28.0  --   --   --  28.6 27.9  MCHC 31.0  --   --   --  32.4 32.0  RDW 15.7*  --   --   --  15.8*  15.6*  PLT 165  --   --   --  219 200   < > = values in this interval not displayed.   Cardiac Enzymes Recent Labs  Lab 03/01/23 0954 03/01/23 1246  TROPONINIHS 19* 29*   Lipid Panel     Component Value Date/Time   CHOL 95 03/02/2023 0422   CHOL 351 (H) 11/18/2022 1216   TRIG 121 03/02/2023 0422   HDL 39 (L) 03/02/2023 0422   HDL 31 (L) 11/18/2022 1216   CHOLHDL 2.4 03/02/2023 0422   VLDL 24 03/02/2023 0422   LDLCALC 32 03/02/2023 0422   LDLCALC Comment (A) 11/18/2022 1216   LDLDIRECT 137 (H) 03/01/2020 1012   LDLDIRECT 96.0 09/23/2019 0828   LABVLDL Comment (A) 11/18/2022 1216    Cardiac Studies   RHC (9/20):  Hemodynamics (mmHg)  RA mean 3 RV 47/4 PA 47/11, mean 28 PCWP mean 14 with v-waves to 26 (prominent V-waves) Oxygen saturations: PA 61% AO 100% Cardiac Output (Fick) 4.21  Cardiac Index (Fick) 2.37 PVR 3.3 WU   Assessment & Plan   1.  Acute on chronic HFpEF, RV predominant.  Had severe primarily pulmonary venous hypertension that improved with diuresis.  Right heart catheterization from September 20 noted above.  Diuretics held given low right atrial pressure and renal insufficiency with plan to possibly resume oral regimen over the weekend.  Also not on SGLT2 inhibitor with UTI no Aldactone with current renal insufficiency.  2.  Multivessel CAD presenting with unstable angina. Diffuse RCA disease with subtotal occlusion and collaterals to PDA from left, 90% mid LAD stenosis, 80% mid LCx stenosis.  Patient reevaluated by Dr. Laneta Simmers yesterday, tentative plan is for CABG next week.  3.  OSA on CPAP.  4.  Permanent atrial fibrillation status post AV node ablation and pacing.  Continues on IV heparin for now.  No longer on Cardizem CD.  5.  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  Creatinine down to 1.48.  Continue aspirin, Lipitor, fenofibrate, Imdur, IV heparin and Toprol-XL.  Resume Lasix 40 mg PO daily, check BMET a.m.  For questions or updates, please  contact [facility] Please consult [email or web address] for contact info under   Signed, Nona Dell, MD  03/08/2023, 10:36 AM

## 2023-03-08 NOTE — H&P (View-Only) (Signed)
2 Days Post-Op Procedure(s) (LRB): RIGHT HEART CATH (N/A) Subjective: No complaints  Objective: Vital signs in last 24 hours: Temp:  [97.5 F (36.4 C)-98.2 F (36.8 C)] 98.2 F (36.8 C) (09/22 0356) Pulse Rate:  [59-61] 61 (09/22 1005) Cardiac Rhythm: Ventricular paced (09/22 0800) Resp:  [16-20] 20 (09/22 0356) BP: (96-128)/(58-72) 113/72 (09/22 1005) SpO2:  [95 %-98 %] 95 % (09/22 0356) Weight:  [75.5 kg] 75.5 kg (09/22 0653)  Hemodynamic parameters for last 24 hours:    Intake/Output from previous day: 09/21 0701 - 09/22 0700 In: 1257.3 [P.O.:960; I.V.:297.3] Out: 900 [Urine:900] Intake/Output this shift: Total I/O In: 10 [I.V.:10] Out: -   General appearance: alert and cooperative Heart: regular rate and rhythm Lungs: clear to auscultation bilaterally  Lab Results: Recent Labs    03/07/23 0433 03/08/23 0443  WBC 5.5 4.9  HGB 11.0* 10.8*  HCT 33.9* 33.8*  PLT 219 200   BMET:  Recent Labs    03/07/23 0433 03/08/23 0443  NA 132* 134*  K 3.9 3.7  CL 95* 96*  CO2 24 25  GLUCOSE 250* 125*  BUN 45* 51*  CREATININE 1.71* 1.48*  CALCIUM 8.8* 9.1    PT/INR: No results for input(s): "LABPROT", "INR" in the last 72 hours. ABG    Component Value Date/Time   PHART 7.343 (L) 03/03/2023 1505   HCO3 29.8 (H) 03/06/2023 1005   HCO3 29.9 (H) 03/06/2023 1005   TCO2 31 03/06/2023 1005   TCO2 31 03/06/2023 1005   ACIDBASEDEF 6.0 (H) 03/03/2023 1511   ACIDBASEDEF 6.0 (H) 03/03/2023 1511   O2SAT 63 03/06/2023 1005   O2SAT 59 03/06/2023 1005   CBG (last 3)  Recent Labs    03/07/23 1608 03/07/23 2047 03/08/23 0758  GLUCAP 167* 202* 223*    Assessment/Plan:  Creat improved to 1.48. Stable for CABG tomorrow. She has no further questions. Orders written.  LOS: 4 days    [name] 03/08/2023

## 2023-03-09 ENCOUNTER — Inpatient Hospital Stay (HOSPITAL_COMMUNITY): Payer: Self-pay | Admitting: Certified Registered"

## 2023-03-09 ENCOUNTER — Inpatient Hospital Stay (HOSPITAL_COMMUNITY): Payer: Medicare HMO

## 2023-03-09 ENCOUNTER — Inpatient Hospital Stay (HOSPITAL_COMMUNITY)
Admission: EM | Disposition: A | Discharge status: Skilled Nursing Facility | Payer: Self-pay | Source: Home / Self Care | Attending: Surgery

## 2023-03-09 ENCOUNTER — Other Ambulatory Visit: Payer: Self-pay

## 2023-03-09 DIAGNOSIS — N1832 Chronic kidney disease, stage 3b: Secondary | ICD-10-CM

## 2023-03-09 DIAGNOSIS — I5033 Acute on chronic diastolic (congestive) heart failure: Secondary | ICD-10-CM | POA: Diagnosis not present

## 2023-03-09 DIAGNOSIS — I130 Hypertensive heart and chronic kidney disease with heart failure and stage 1 through stage 4 chronic kidney disease, or unspecified chronic kidney disease: Secondary | ICD-10-CM

## 2023-03-09 DIAGNOSIS — I25110 Atherosclerotic heart disease of native coronary artery with unstable angina pectoris: Secondary | ICD-10-CM

## 2023-03-09 DIAGNOSIS — I200 Unstable angina: Secondary | ICD-10-CM | POA: Diagnosis not present

## 2023-03-09 DIAGNOSIS — I5032 Chronic diastolic (congestive) heart failure: Secondary | ICD-10-CM

## 2023-03-09 DIAGNOSIS — I2510 Atherosclerotic heart disease of native coronary artery without angina pectoris: Secondary | ICD-10-CM

## 2023-03-09 DIAGNOSIS — Z951 Presence of aortocoronary bypass graft: Secondary | ICD-10-CM

## 2023-03-09 HISTORY — PX: CORONARY ARTERY BYPASS GRAFT: SHX141

## 2023-03-09 HISTORY — PX: TEE WITHOUT CARDIOVERSION: SHX5443

## 2023-03-09 LAB — POCT I-STAT, CHEM 8
BUN: 44 mg/dL — ABNORMAL HIGH (ref 8–23)
BUN: 41 mg/dL — ABNORMAL HIGH (ref 8–23)
BUN: 40 mg/dL — ABNORMAL HIGH (ref 8–23)
BUN: 35 mg/dL — ABNORMAL HIGH (ref 8–23)
Calcium, Ion: 1.19 mmol/L (ref 1.15–1.40)
Calcium, Ion: 1.17 mmol/L (ref 1.15–1.40)
Calcium, Ion: 1.09 mmol/L — ABNORMAL LOW (ref 1.15–1.40)
Calcium, Ion: 1.06 mmol/L — ABNORMAL LOW (ref 1.15–1.40)
Chloride: 98 mmol/L (ref 98–111)
Chloride: 99 mmol/L (ref 98–111)
Chloride: 97 mmol/L — ABNORMAL LOW (ref 98–111)
Chloride: 97 mmol/L — ABNORMAL LOW (ref 98–111)
Creatinine, Ser: 1.4 mg/dL — ABNORMAL HIGH (ref 0.44–1.00)
Creatinine, Ser: 1.3 mg/dL — ABNORMAL HIGH (ref 0.44–1.00)
Creatinine, Ser: 1.1 mg/dL — ABNORMAL HIGH (ref 0.44–1.00)
Creatinine, Ser: 1 mg/dL (ref 0.44–1.00)
Glucose, Bld: 171 mg/dL — ABNORMAL HIGH (ref 70–99)
Glucose, Bld: 125 mg/dL — ABNORMAL HIGH (ref 70–99)
Glucose, Bld: 88 mg/dL (ref 70–99)
Glucose, Bld: 213 mg/dL — ABNORMAL HIGH (ref 70–99)
HCT: 33 % — ABNORMAL LOW (ref 36.0–46.0)
HCT: 31 % — ABNORMAL LOW (ref 36.0–46.0)
HCT: 25 % — ABNORMAL LOW (ref 36.0–46.0)
HCT: 25 % — ABNORMAL LOW (ref 36.0–46.0)
Hemoglobin: 11.2 g/dL — ABNORMAL LOW (ref 12.0–15.0)
Hemoglobin: 10.5 g/dL — ABNORMAL LOW (ref 12.0–15.0)
Hemoglobin: 8.5 g/dL — ABNORMAL LOW (ref 12.0–15.0)
Hemoglobin: 8.5 g/dL — ABNORMAL LOW (ref 12.0–15.0)
Potassium: 4.2 mmol/L (ref 3.5–5.1)
Potassium: 3.9 mmol/L (ref 3.5–5.1)
Potassium: 4.6 mmol/L (ref 3.5–5.1)
Potassium: 4.6 mmol/L (ref 3.5–5.1)
Sodium: 135 mmol/L (ref 135–145)
Sodium: 134 mmol/L — ABNORMAL LOW (ref 135–145)
Sodium: 134 mmol/L — ABNORMAL LOW (ref 135–145)
Sodium: 133 mmol/L — ABNORMAL LOW (ref 135–145)
TCO2: 26 mmol/L (ref 22–32)
TCO2: 24 mmol/L (ref 22–32)
TCO2: 26 mmol/L (ref 22–32)
TCO2: 25 mmol/L (ref 22–32)

## 2023-03-09 LAB — GLUCOSE, CAPILLARY
Glucose-Capillary: 206 mg/dL — ABNORMAL HIGH (ref 70–99)
Glucose-Capillary: 156 mg/dL — ABNORMAL HIGH (ref 70–99)
Glucose-Capillary: 90 mg/dL (ref 70–99)
Glucose-Capillary: 82 mg/dL (ref 70–99)
Glucose-Capillary: 97 mg/dL (ref 70–99)
Glucose-Capillary: 123 mg/dL — ABNORMAL HIGH (ref 70–99)
Glucose-Capillary: 125 mg/dL — ABNORMAL HIGH (ref 70–99)
Glucose-Capillary: 160 mg/dL — ABNORMAL HIGH (ref 70–99)
Glucose-Capillary: 162 mg/dL — ABNORMAL HIGH (ref 70–99)
Glucose-Capillary: 179 mg/dL — ABNORMAL HIGH (ref 70–99)
Glucose-Capillary: 184 mg/dL — ABNORMAL HIGH (ref 70–99)
Glucose-Capillary: 168 mg/dL — ABNORMAL HIGH (ref 70–99)
Glucose-Capillary: 164 mg/dL — ABNORMAL HIGH (ref 70–99)

## 2023-03-09 LAB — ECHO INTRAOPERATIVE TEE
AV Mean grad: 2.5 mmHg
AV Peak grad: 5.6 mmHg
Ao pk vel: 1.18 m/s
Height: 63 in
S' Lateral: 2.25 cm
Weight: 2662.4 oz

## 2023-03-09 LAB — POCT I-STAT 7, (LYTES, BLD GAS, ICA,H+H)
Acid-base deficit: 2 mmol/L (ref 0.0–2.0)
Acid-base deficit: 2 mmol/L (ref 0.0–2.0)
Acid-base deficit: 4 mmol/L — ABNORMAL HIGH (ref 0.0–2.0)
Acid-base deficit: 4 mmol/L — ABNORMAL HIGH (ref 0.0–2.0)
Acid-base deficit: 2 mmol/L (ref 0.0–2.0)
Bicarbonate: 23.9 mmol/L (ref 20.0–28.0)
Bicarbonate: 23.4 mmol/L (ref 20.0–28.0)
Bicarbonate: 21.1 mmol/L (ref 20.0–28.0)
Bicarbonate: 21.7 mmol/L (ref 20.0–28.0)
Bicarbonate: 22.2 mmol/L (ref 20.0–28.0)
Calcium, Ion: 0.97 mmol/L — ABNORMAL LOW (ref 1.15–1.40)
Calcium, Ion: 1.06 mmol/L — ABNORMAL LOW (ref 1.15–1.40)
Calcium, Ion: 1.14 mmol/L — ABNORMAL LOW (ref 1.15–1.40)
Calcium, Ion: 1.15 mmol/L (ref 1.15–1.40)
Calcium, Ion: 1.1 mmol/L — ABNORMAL LOW (ref 1.15–1.40)
HCT: 26 % — ABNORMAL LOW (ref 36.0–46.0)
HCT: 24 % — ABNORMAL LOW (ref 36.0–46.0)
HCT: 28 % — ABNORMAL LOW (ref 36.0–46.0)
HCT: 30 % — ABNORMAL LOW (ref 36.0–46.0)
HCT: 30 % — ABNORMAL LOW (ref 36.0–46.0)
Hemoglobin: 8.8 g/dL — ABNORMAL LOW (ref 12.0–15.0)
Hemoglobin: 8.2 g/dL — ABNORMAL LOW (ref 12.0–15.0)
Hemoglobin: 9.5 g/dL — ABNORMAL LOW (ref 12.0–15.0)
Hemoglobin: 10.2 g/dL — ABNORMAL LOW (ref 12.0–15.0)
Hemoglobin: 10.2 g/dL — ABNORMAL LOW (ref 12.0–15.0)
O2 Saturation: 100 %
O2 Saturation: 100 %
O2 Saturation: 99 %
O2 Saturation: 99 %
O2 Saturation: 100 %
Patient temperature: 36.2
Patient temperature: 36.5
Patient temperature: 36.7
Potassium: 4.3 mmol/L (ref 3.5–5.1)
Potassium: 4.6 mmol/L (ref 3.5–5.1)
Potassium: 3.6 mmol/L (ref 3.5–5.1)
Potassium: 5 mmol/L (ref 3.5–5.1)
Potassium: 4.7 mmol/L (ref 3.5–5.1)
Sodium: 133 mmol/L — ABNORMAL LOW (ref 135–145)
Sodium: 134 mmol/L — ABNORMAL LOW (ref 135–145)
Sodium: 135 mmol/L (ref 135–145)
Sodium: 135 mmol/L (ref 135–145)
Sodium: 136 mmol/L (ref 135–145)
TCO2: 25 mmol/L (ref 22–32)
TCO2: 25 mmol/L (ref 22–32)
TCO2: 22 mmol/L (ref 22–32)
TCO2: 23 mmol/L (ref 22–32)
TCO2: 23 mmol/L (ref 22–32)
pCO2 arterial: 42.8 mmHg (ref 32–48)
pCO2 arterial: 39.3 mmHg (ref 32–48)
pCO2 arterial: 37.2 mmHg (ref 32–48)
pCO2 arterial: 42.2 mmHg (ref 32–48)
pCO2 arterial: 36.2 mmHg (ref 32–48)
pH, Arterial: 7.356 (ref 7.35–7.45)
pH, Arterial: 7.383 (ref 7.35–7.45)
pH, Arterial: 7.358 (ref 7.35–7.45)
pH, Arterial: 7.317 — ABNORMAL LOW (ref 7.35–7.45)
pH, Arterial: 7.394 (ref 7.35–7.45)
pO2, Arterial: 385 mmHg — ABNORMAL HIGH (ref 83–108)
pO2, Arterial: 444 mmHg — ABNORMAL HIGH (ref 83–108)
pO2, Arterial: 133 mmHg — ABNORMAL HIGH (ref 83–108)
pO2, Arterial: 159 mmHg — ABNORMAL HIGH (ref 83–108)
pO2, Arterial: 166 mmHg — ABNORMAL HIGH (ref 83–108)

## 2023-03-09 LAB — POCT I-STAT EG7
Acid-base deficit: 1 mmol/L (ref 0.0–2.0)
Bicarbonate: 24.9 mmol/L (ref 20.0–28.0)
Calcium, Ion: 1 mmol/L — ABNORMAL LOW (ref 1.15–1.40)
HCT: 24 % — ABNORMAL LOW (ref 36.0–46.0)
Hemoglobin: 8.2 g/dL — ABNORMAL LOW (ref 12.0–15.0)
O2 Saturation: 84 %
Potassium: 5.2 mmol/L — ABNORMAL HIGH (ref 3.5–5.1)
Sodium: 134 mmol/L — ABNORMAL LOW (ref 135–145)
TCO2: 26 mmol/L (ref 22–32)
pCO2, Ven: 44.4 mmHg (ref 44–60)
pH, Ven: 7.357 (ref 7.25–7.43)
pO2, Ven: 51 mmHg — ABNORMAL HIGH (ref 32–45)

## 2023-03-09 LAB — BASIC METABOLIC PANEL
Anion gap: 13 (ref 5–15)
Anion gap: 10 (ref 5–15)
BUN: 48 mg/dL — ABNORMAL HIGH (ref 8–23)
BUN: 35 mg/dL — ABNORMAL HIGH (ref 8–23)
CO2: 24 mmol/L (ref 22–32)
CO2: 23 mmol/L (ref 22–32)
Calcium: 9 mg/dL (ref 8.9–10.3)
Calcium: 7.9 mg/dL — ABNORMAL LOW (ref 8.9–10.3)
Chloride: 97 mmol/L — ABNORMAL LOW (ref 98–111)
Chloride: 104 mmol/L (ref 98–111)
Creatinine, Ser: 1.49 mg/dL — ABNORMAL HIGH (ref 0.44–1.00)
Creatinine, Ser: 1.35 mg/dL — ABNORMAL HIGH (ref 0.44–1.00)
GFR, Estimated: 39 mL/min — ABNORMAL LOW (ref >60)
GFR, Estimated: 44 mL/min — ABNORMAL LOW (ref >60)
Glucose, Bld: 187 mg/dL — ABNORMAL HIGH (ref 70–99)
Glucose, Bld: 167 mg/dL — ABNORMAL HIGH (ref 70–99)
Potassium: 4.1 mmol/L (ref 3.5–5.1)
Potassium: 4.7 mmol/L (ref 3.5–5.1)
Sodium: 134 mmol/L — ABNORMAL LOW (ref 135–145)
Sodium: 137 mmol/L (ref 135–145)

## 2023-03-09 LAB — CBC
HCT: 33.6 % — ABNORMAL LOW (ref 36.0–46.0)
HCT: 29.1 % — ABNORMAL LOW (ref 36.0–46.0)
HCT: 30.3 % — ABNORMAL LOW (ref 36.0–46.0)
Hemoglobin: 10.7 g/dL — ABNORMAL LOW (ref 12.0–15.0)
Hemoglobin: 9.4 g/dL — ABNORMAL LOW (ref 12.0–15.0)
Hemoglobin: 9.6 g/dL — ABNORMAL LOW (ref 12.0–15.0)
MCH: 28.1 pg (ref 26.0–34.0)
MCH: 29.6 pg (ref 26.0–34.0)
MCH: 28.8 pg (ref 26.0–34.0)
MCHC: 31.8 g/dL (ref 30.0–36.0)
MCHC: 32.3 g/dL (ref 30.0–36.0)
MCHC: 31.7 g/dL (ref 30.0–36.0)
MCV: 88.2 fL (ref 80.0–100.0)
MCV: 91.5 fL (ref 80.0–100.0)
MCV: 91 fL (ref 80.0–100.0)
Platelets: 214 10*3/uL (ref 150–400)
Platelets: 128 10*3/uL — ABNORMAL LOW (ref 150–400)
Platelets: 164 10*3/uL (ref 150–400)
RBC: 3.81 MIL/uL — ABNORMAL LOW (ref 3.87–5.11)
RBC: 3.18 MIL/uL — ABNORMAL LOW (ref 3.87–5.11)
RBC: 3.33 MIL/uL — ABNORMAL LOW (ref 3.87–5.11)
RDW: 15.6 % — ABNORMAL HIGH (ref 11.5–15.5)
RDW: 15.6 % — ABNORMAL HIGH (ref 11.5–15.5)
RDW: 15.6 % — ABNORMAL HIGH (ref 11.5–15.5)
WBC: 5.5 10*3/uL (ref 4.0–10.5)
WBC: 8.3 10*3/uL (ref 4.0–10.5)
WBC: 13.7 10*3/uL — ABNORMAL HIGH (ref 4.0–10.5)
nRBC: 0 % (ref 0.0–0.2)
nRBC: 0 % (ref 0.0–0.2)
nRBC: 0 % (ref 0.0–0.2)

## 2023-03-09 LAB — HEMOGLOBIN AND HEMATOCRIT, BLOOD
HCT: 23.6 % — ABNORMAL LOW (ref 36.0–46.0)
Hemoglobin: 7.7 g/dL — ABNORMAL LOW (ref 12.0–15.0)

## 2023-03-09 LAB — HEPARIN LEVEL (UNFRACTIONATED): Heparin Unfractionated: 0.4 IU/mL (ref 0.30–0.70)

## 2023-03-09 LAB — MAGNESIUM: Magnesium: 3 mg/dL — ABNORMAL HIGH (ref 1.7–2.4)

## 2023-03-09 LAB — PLATELET COUNT: Platelets: 152 10*3/uL (ref 150–400)

## 2023-03-09 LAB — PROTIME-INR
INR: 1.4 — ABNORMAL HIGH (ref 0.8–1.2)
Prothrombin Time: 17.4 seconds — ABNORMAL HIGH (ref 11.4–15.2)

## 2023-03-09 LAB — APTT: aPTT: 35 seconds (ref 24–36)

## 2023-03-09 SURGERY — CORONARY ARTERY BYPASS GRAFTING (CABG)
Anesthesia: General | Site: Chest

## 2023-03-09 MED ORDER — INSULIN REGULAR(HUMAN) IN NACL 100-0.9 UT/100ML-% IV SOLN: INTRAVENOUS | Status: DC

## 2023-03-09 MED ORDER — PANTOPRAZOLE SODIUM 40 MG IV SOLR
40.0000 mg | Freq: Every day | INTRAVENOUS | Status: AC
  Administered 2023-03-09 – 2023-03-10 (×2): 40 mg via INTRAVENOUS
  Filled 2023-03-09 (×2): qty 10

## 2023-03-09 MED ORDER — SODIUM BICARBONATE 8.4 % IV SOLN
50.0000 meq | Freq: Once | INTRAVENOUS | Status: AC
  Administered 2023-03-09: 50 meq via INTRAVENOUS

## 2023-03-09 MED ORDER — THROMBIN 20000 UNITS EX SOLR
CUTANEOUS | Status: DC | PRN
  Administered 2023-03-09: 20000 [IU] via TOPICAL

## 2023-03-09 MED ORDER — FENTANYL CITRATE (PF) 250 MCG/5ML IJ SOLN
INTRAMUSCULAR | Status: AC
  Filled 2023-03-09: qty 5

## 2023-03-09 MED ORDER — LACTATED RINGERS IV SOLN: INTRAVENOUS | Status: DC

## 2023-03-09 MED ORDER — FENTANYL CITRATE (PF) 250 MCG/5ML IJ SOLN
INTRAMUSCULAR | Status: DC | PRN
  Administered 2023-03-09: 150 ug via INTRAVENOUS
  Administered 2023-03-09 (×2): 50 ug via INTRAVENOUS
  Administered 2023-03-09: 100 ug via INTRAVENOUS
  Administered 2023-03-09: 50 ug via INTRAVENOUS
  Administered 2023-03-09: 100 ug via INTRAVENOUS
  Administered 2023-03-09: 550 ug via INTRAVENOUS

## 2023-03-09 MED ORDER — ORAL CARE MOUTH RINSE: 15.0000 mL | OROMUCOSAL | Status: DC | PRN

## 2023-03-09 MED ORDER — ACETAMINOPHEN 160 MG/5ML PO SOLN
650.0000 mg | Freq: Once | ORAL | Status: AC
  Administered 2023-03-09: 650 mg
  Filled 2023-03-09: qty 20.3

## 2023-03-09 MED ORDER — ACETAMINOPHEN 160 MG/5ML PO SOLN: 1000.0000 mg | Freq: Four times a day (QID) | ORAL | Status: DC

## 2023-03-09 MED ORDER — DEXMEDETOMIDINE HCL IN NACL 400 MCG/100ML IV SOLN
0.0000 ug/kg/h | INTRAVENOUS | Status: DC
  Administered 2023-03-09: 0.1 ug/kg/h via INTRAVENOUS
  Filled 2023-03-09: qty 100

## 2023-03-09 MED ORDER — SODIUM CHLORIDE 0.9% FLUSH: 3.0000 mL | INTRAVENOUS | Status: DC | PRN

## 2023-03-09 MED ORDER — ACETAMINOPHEN 500 MG PO TABS
1000.0000 mg | ORAL_TABLET | Freq: Four times a day (QID) | ORAL | Status: DC
  Administered 2023-03-09 – 2023-03-12 (×10): 1000 mg via ORAL
  Filled 2023-03-09 (×10): qty 2

## 2023-03-09 MED ORDER — HEPARIN SODIUM (PORCINE) 1000 UNIT/ML IJ SOLN
INTRAMUSCULAR | Status: AC
  Filled 2023-03-09: qty 1

## 2023-03-09 MED ORDER — PROPOFOL 10 MG/ML IV BOLUS
INTRAVENOUS | Status: DC | PRN
  Administered 2023-03-09: 20 mg via INTRAVENOUS

## 2023-03-09 MED ORDER — CALCIUM CHLORIDE 10 % IV SOLN
INTRAVENOUS | Status: DC | PRN
Start: 2023-03-09 — End: 2023-03-09
  Administered 2023-03-09 (×2): 100 mg via INTRAVENOUS

## 2023-03-09 MED ORDER — MIDAZOLAM HCL 2 MG/2ML IJ SOLN: 2.0000 mg | INTRAMUSCULAR | Status: DC | PRN

## 2023-03-09 MED ORDER — PHENYLEPHRINE 80 MCG/ML (10ML) SYRINGE FOR IV PUSH (FOR BLOOD PRESSURE SUPPORT)
PREFILLED_SYRINGE | INTRAVENOUS | Status: AC
  Filled 2023-03-09: qty 10

## 2023-03-09 MED ORDER — METOPROLOL TARTRATE 5 MG/5ML IV SOLN: 2.5000 mg | INTRAVENOUS | Status: DC | PRN

## 2023-03-09 MED ORDER — PLASMA-LYTE A IV SOLN
INTRAVENOUS | Status: DC | PRN
  Administered 2023-03-09: 500 mL

## 2023-03-09 MED ORDER — CEFAZOLIN SODIUM-DEXTROSE 2-4 GM/100ML-% IV SOLN
2.0000 g | Freq: Three times a day (TID) | INTRAVENOUS | Status: AC
  Administered 2023-03-09 – 2023-03-11 (×6): 2 g via INTRAVENOUS
  Filled 2023-03-09 (×6): qty 100

## 2023-03-09 MED ORDER — ALBUMIN HUMAN 5 % IV SOLN: INTRAVENOUS | Status: DC | PRN

## 2023-03-09 MED ORDER — PHENYLEPHRINE 80 MCG/ML (10ML) SYRINGE FOR IV PUSH (FOR BLOOD PRESSURE SUPPORT)
PREFILLED_SYRINGE | INTRAVENOUS | Status: DC | PRN
  Administered 2023-03-09 (×2): 80 ug via INTRAVENOUS

## 2023-03-09 MED ORDER — TRAMADOL HCL 50 MG PO TABS
50.0000 mg | ORAL_TABLET | ORAL | Status: DC | PRN
  Administered 2023-03-09: 100 mg via ORAL
  Filled 2023-03-09: qty 2

## 2023-03-09 MED ORDER — ORAL CARE MOUTH RINSE
15.0000 mL | OROMUCOSAL | Status: DC
  Administered 2023-03-09: 15 mL via OROMUCOSAL

## 2023-03-09 MED ORDER — DOCUSATE SODIUM 100 MG PO CAPS
200.0000 mg | ORAL_CAPSULE | Freq: Every day | ORAL | Status: DC
  Administered 2023-03-10 – 2023-03-12 (×3): 200 mg via ORAL
  Filled 2023-03-09 (×3): qty 2

## 2023-03-09 MED ORDER — LACTATED RINGERS IV SOLN: INTRAVENOUS | Status: DC | PRN

## 2023-03-09 MED ORDER — BISACODYL 5 MG PO TBEC
10.0000 mg | DELAYED_RELEASE_TABLET | Freq: Every day | ORAL | Status: DC
  Administered 2023-03-10 – 2023-03-11 (×2): 10 mg via ORAL
  Filled 2023-03-09 (×2): qty 2

## 2023-03-09 MED ORDER — OXYCODONE HCL 5 MG PO TABS
5.0000 mg | ORAL_TABLET | ORAL | Status: DC | PRN
  Administered 2023-03-10 (×3): 10 mg via ORAL
  Administered 2023-03-11: 5 mg via ORAL
  Administered 2023-03-11 – 2023-03-12 (×4): 10 mg via ORAL
  Filled 2023-03-09 (×4): qty 2
  Filled 2023-03-09: qty 1
  Filled 2023-03-09 (×3): qty 2

## 2023-03-09 MED ORDER — ALBUMIN HUMAN 5 % IV SOLN
250.0000 mL | INTRAVENOUS | Status: DC | PRN
  Administered 2023-03-09 – 2023-03-10 (×3): 12.5 g via INTRAVENOUS
  Filled 2023-03-09: qty 250

## 2023-03-09 MED ORDER — ASPIRIN 81 MG PO CHEW: 324.0000 mg | CHEWABLE_TABLET | Freq: Every day | ORAL | Status: DC

## 2023-03-09 MED ORDER — DEXTROSE 50 % IV SOLN: 0.0000 mL | INTRAVENOUS | Status: DC | PRN

## 2023-03-09 MED ORDER — VANCOMYCIN HCL IN DEXTROSE 1-5 GM/200ML-% IV SOLN
1000.0000 mg | Freq: Once | INTRAVENOUS | Status: AC
  Administered 2023-03-09: 1000 mg via INTRAVENOUS
  Filled 2023-03-09: qty 200

## 2023-03-09 MED ORDER — PROTAMINE SULFATE 10 MG/ML IV SOLN
INTRAVENOUS | Status: DC | PRN
  Administered 2023-03-09: 10 mg via INTRAVENOUS
  Administered 2023-03-09 (×3): 50 mg via INTRAVENOUS

## 2023-03-09 MED ORDER — ROCURONIUM BROMIDE 10 MG/ML (PF) SYRINGE
PREFILLED_SYRINGE | INTRAVENOUS | Status: DC | PRN
  Administered 2023-03-09: 30 mg via INTRAVENOUS
  Administered 2023-03-09: 50 mg via INTRAVENOUS
  Administered 2023-03-09: 70 mg via INTRAVENOUS

## 2023-03-09 MED ORDER — POTASSIUM CHLORIDE 10 MEQ/50ML IV SOLN
10.0000 meq | INTRAVENOUS | Status: AC
  Administered 2023-03-09 (×3): 10 meq via INTRAVENOUS

## 2023-03-09 MED ORDER — HEPARIN SODIUM (PORCINE) 1000 UNIT/ML IJ SOLN
INTRAMUSCULAR | Status: DC | PRN
  Administered 2023-03-09: 26000 [IU] via INTRAVENOUS

## 2023-03-09 MED ORDER — METOPROLOL TARTRATE 12.5 MG HALF TABLET: 12.5000 mg | ORAL_TABLET | Freq: Two times a day (BID) | ORAL | Status: DC

## 2023-03-09 MED ORDER — CHLORHEXIDINE GLUCONATE 0.12 % MT SOLN
15.0000 mL | OROMUCOSAL | Status: AC
  Administered 2023-03-09: 15 mL via OROMUCOSAL
  Filled 2023-03-09: qty 15

## 2023-03-09 MED ORDER — PHENYLEPHRINE HCL-NACL 20-0.9 MG/250ML-% IV SOLN
0.0000 ug/min | INTRAVENOUS | Status: DC
  Administered 2023-03-09: 10 ug/min via INTRAVENOUS

## 2023-03-09 MED ORDER — PROTAMINE SULFATE 10 MG/ML IV SOLN
INTRAVENOUS | Status: AC
  Filled 2023-03-09: qty 25

## 2023-03-09 MED ORDER — SODIUM CHLORIDE 0.9 % IV SOLN: INTRAVENOUS | Status: DC

## 2023-03-09 MED ORDER — SODIUM CHLORIDE 0.9% FLUSH
3.0000 mL | Freq: Two times a day (BID) | INTRAVENOUS | Status: DC
  Administered 2023-03-10 – 2023-03-12 (×5): 3 mL via INTRAVENOUS

## 2023-03-09 MED ORDER — HEMOSTATIC AGENTS (NO CHARGE) OPTIME
TOPICAL | Status: DC | PRN
Start: 2023-03-09 — End: 2023-03-09
  Administered 2023-03-09 (×4): 1 via TOPICAL

## 2023-03-09 MED ORDER — ROCURONIUM BROMIDE 10 MG/ML (PF) SYRINGE
PREFILLED_SYRINGE | INTRAVENOUS | Status: AC
  Filled 2023-03-09: qty 10

## 2023-03-09 MED ORDER — CHLORHEXIDINE GLUCONATE CLOTH 2 % EX PADS
6.0000 | MEDICATED_PAD | Freq: Every day | CUTANEOUS | Status: DC
  Administered 2023-03-09 – 2023-03-12 (×4): 6 via TOPICAL

## 2023-03-09 MED ORDER — ASPIRIN 325 MG PO TBEC
325.0000 mg | DELAYED_RELEASE_TABLET | Freq: Every day | ORAL | Status: DC
  Administered 2023-03-10 – 2023-03-12 (×3): 325 mg via ORAL
  Filled 2023-03-09 (×3): qty 1

## 2023-03-09 MED ORDER — NOREPINEPHRINE 4 MG/250ML-% IV SOLN
2.0000 ug/min | INTRAVENOUS | Status: DC
  Administered 2023-03-09: 5 ug/min via INTRAVENOUS
  Administered 2023-03-10: 6 ug/min via INTRAVENOUS
  Administered 2023-03-11: 4 ug/min via INTRAVENOUS
  Filled 2023-03-09 (×2): qty 250

## 2023-03-09 MED ORDER — 0.9 % SODIUM CHLORIDE (POUR BTL) OPTIME
TOPICAL | Status: DC | PRN
Start: 2023-03-09 — End: 2023-03-09
  Administered 2023-03-09: 5000 mL

## 2023-03-09 MED ORDER — MAGNESIUM SULFATE 4 GM/100ML IV SOLN
4.0000 g | Freq: Once | INTRAVENOUS | Status: AC
  Administered 2023-03-09: 4 g via INTRAVENOUS
  Filled 2023-03-09: qty 100

## 2023-03-09 MED ORDER — METOCLOPRAMIDE HCL 5 MG/ML IJ SOLN
10.0000 mg | Freq: Four times a day (QID) | INTRAMUSCULAR | Status: AC
  Administered 2023-03-09 – 2023-03-10 (×6): 10 mg via INTRAVENOUS
  Filled 2023-03-09 (×6): qty 2

## 2023-03-09 MED ORDER — METOPROLOL TARTRATE 25 MG/10 ML ORAL SUSPENSION: 12.5000 mg | Freq: Two times a day (BID) | ORAL | Status: DC

## 2023-03-09 MED ORDER — SODIUM CHLORIDE 0.9 % IV SOLN: 250.0000 mL | INTRAVENOUS | Status: DC

## 2023-03-09 MED ORDER — ONDANSETRON HCL 4 MG/2ML IJ SOLN
4.0000 mg | Freq: Four times a day (QID) | INTRAMUSCULAR | Status: DC | PRN
  Administered 2023-03-10 – 2023-03-16 (×3): 4 mg via INTRAVENOUS
  Filled 2023-03-09 (×3): qty 2

## 2023-03-09 MED ORDER — PROPOFOL 10 MG/ML IV BOLUS
INTRAVENOUS | Status: AC
  Filled 2023-03-09: qty 20

## 2023-03-09 MED ORDER — MIDAZOLAM HCL (PF) 10 MG/2ML IJ SOLN
INTRAMUSCULAR | Status: AC
  Filled 2023-03-09: qty 2

## 2023-03-09 MED ORDER — SODIUM CHLORIDE 0.45 % IV SOLN: INTRAVENOUS | Status: DC | PRN

## 2023-03-09 MED ORDER — PANTOPRAZOLE SODIUM 40 MG PO TBEC
40.0000 mg | DELAYED_RELEASE_TABLET | Freq: Every day | ORAL | Status: DC
  Administered 2023-03-11 – 2023-03-18 (×8): 40 mg via ORAL
  Filled 2023-03-09 (×8): qty 1

## 2023-03-09 MED ORDER — ASPIRIN 81 MG PO CHEW
324.0000 mg | CHEWABLE_TABLET | Freq: Once | ORAL | Status: AC
  Administered 2023-03-09: 324 mg via ORAL
  Filled 2023-03-09: qty 4

## 2023-03-09 MED ORDER — CALCIUM CHLORIDE 10 % IV SOLN
INTRAVENOUS | Status: AC
  Filled 2023-03-09: qty 10

## 2023-03-09 MED ORDER — BISACODYL 10 MG RE SUPP: 10.0000 mg | Freq: Every day | RECTAL | Status: DC

## 2023-03-09 MED ORDER — THROMBIN (RECOMBINANT) 20000 UNITS EX SOLR
CUTANEOUS | Status: AC
  Filled 2023-03-09: qty 20000

## 2023-03-09 MED ORDER — MIDAZOLAM HCL (PF) 5 MG/ML IJ SOLN
INTRAMUSCULAR | Status: DC | PRN
  Administered 2023-03-09: 2 mg via INTRAVENOUS
  Administered 2023-03-09: 1 mg via INTRAVENOUS
  Administered 2023-03-09: 2 mg via INTRAVENOUS
  Administered 2023-03-09: 1 mg via INTRAVENOUS
  Administered 2023-03-09: 3 mg via INTRAVENOUS

## 2023-03-09 MED ORDER — MORPHINE SULFATE (PF) 2 MG/ML IV SOLN
1.0000 mg | INTRAVENOUS | Status: DC | PRN
  Administered 2023-03-09 – 2023-03-10 (×3): 2 mg via INTRAVENOUS
  Filled 2023-03-09 (×4): qty 1

## 2023-03-09 SURGICAL SUPPLY — 113 items
ADH SKN CLS APL DERMABOND .7 (GAUZE/BANDAGES/DRESSINGS) ×2
BAG DECANTER FOR FLEXI CONT (MISCELLANEOUS) ×2 IMPLANT
BLADE CLIPPER SURG (BLADE) ×2 IMPLANT
BLADE STERNUM SYSTEM 6 (BLADE) ×2 IMPLANT
BLADE SURG 11 STRL SS (BLADE) IMPLANT
BNDG CMPR 5X4 KNIT ELC UNQ LF (GAUZE/BANDAGES/DRESSINGS) ×2
BNDG CMPR 6 X 5 YARDS HK CLSR (GAUZE/BANDAGES/DRESSINGS) ×2
BNDG ELASTIC 4INX 5YD STR LF (GAUZE/BANDAGES/DRESSINGS) IMPLANT
BNDG ELASTIC 4X5.8 VLCR STR LF (GAUZE/BANDAGES/DRESSINGS) ×2 IMPLANT
BNDG ELASTIC 6INX 5YD STR LF (GAUZE/BANDAGES/DRESSINGS) IMPLANT
BNDG ELASTIC 6X5.8 VLCR STR LF (GAUZE/BANDAGES/DRESSINGS) ×2 IMPLANT
BNDG GAUZE DERMACEA FLUFF 4 (GAUZE/BANDAGES/DRESSINGS) ×2 IMPLANT
BNDG GZE DERMACEA 4 6PLY (GAUZE/BANDAGES/DRESSINGS) ×2
CANISTER SUCT 3000ML PPV (MISCELLANEOUS) ×2 IMPLANT
CANNULA ARTERIAL VENT 3/8 20FR (CANNULA) IMPLANT
CANNULA MC2 2 STG 36/46 NON-V (CANNULA) IMPLANT
CANNULA VESSEL 3MM BLUNT TIP (CANNULA) IMPLANT
CATH ROBINSON RED A/P 18FR (CATHETERS) ×4 IMPLANT
CATH THORACIC 28FR (CATHETERS) ×2 IMPLANT
CATH THORACIC 36FR (CATHETERS) ×2 IMPLANT
CATH THORACIC 36FR RT ANG (CATHETERS) ×2 IMPLANT
CLIP TI MEDIUM 24 (CLIP) IMPLANT
CLIP TI WIDE RED SMALL 24 (CLIP) IMPLANT
CONTAINER PROTECT SURGISLUSH (MISCELLANEOUS) ×4 IMPLANT
DERMABOND ADVANCED .7 DNX12 (GAUZE/BANDAGES/DRESSINGS) IMPLANT
DRAPE CARDIOVASCULAR INCISE (DRAPES) ×2
DRAPE SRG 135X102X78XABS (DRAPES) ×2 IMPLANT
DRAPE WARM FLUID 44X44 (DRAPES) ×2 IMPLANT
DRSG COVADERM 4X14 (GAUZE/BANDAGES/DRESSINGS) ×2 IMPLANT
ELECT BLADE 4.0 EZ CLEAN MEGAD (MISCELLANEOUS) ×2
ELECT CAUTERY BLADE 6.4 (BLADE) ×2 IMPLANT
ELECT REM PT RETURN 9FT ADLT (ELECTROSURGICAL) ×4
ELECTRODE BLDE 4.0 EZ CLN MEGD (MISCELLANEOUS) IMPLANT
ELECTRODE REM PT RTRN 9FT ADLT (ELECTROSURGICAL) ×4 IMPLANT
FELT TEFLON 1X6 (MISCELLANEOUS) ×2 IMPLANT
GAUZE 4X4 16PLY ~~LOC~~+RFID DBL (SPONGE) IMPLANT
GAUZE SPONGE 4X4 12PLY STRL (GAUZE/BANDAGES/DRESSINGS) ×4 IMPLANT
GLOVE BIO SURGEON STRL SZ 6 (GLOVE) IMPLANT
GLOVE BIO SURGEON STRL SZ 6.5 (GLOVE) IMPLANT
GLOVE BIO SURGEON STRL SZ7 (GLOVE) IMPLANT
GLOVE BIO SURGEON STRL SZ7.5 (GLOVE) IMPLANT
GLOVE BIOGEL PI IND STRL 6 (GLOVE) IMPLANT
GLOVE BIOGEL PI IND STRL 6.5 (GLOVE) IMPLANT
GLOVE BIOGEL PI IND STRL 7.0 (GLOVE) IMPLANT
GLOVE BIOGEL PI IND STRL 7.5 (GLOVE) IMPLANT
GLOVE ECLIPSE 7.0 STRL STRAW (GLOVE) ×4 IMPLANT
GLOVE ORTHO TXT STRL SZ7.5 (GLOVE) IMPLANT
GLOVE SS BIOGEL STRL SZ 6.5 (GLOVE) IMPLANT
GOWN STRL REUS W/ TWL LRG LVL3 (GOWN DISPOSABLE) ×8 IMPLANT
GOWN STRL REUS W/ TWL XL LVL3 (GOWN DISPOSABLE) ×2 IMPLANT
GOWN STRL REUS W/TWL LRG LVL3 (GOWN DISPOSABLE) ×14
GOWN STRL REUS W/TWL XL LVL3 (GOWN DISPOSABLE) ×2
HEMOSTAT POWDER SURGIFOAM 1G (HEMOSTASIS) ×6 IMPLANT
HEMOSTAT SURGICEL 2X14 (HEMOSTASIS) ×2 IMPLANT
INSERT FOGARTY 61MM (MISCELLANEOUS) IMPLANT
INSERT FOGARTY XLG (MISCELLANEOUS) IMPLANT
KIT BASIN OR (CUSTOM PROCEDURE TRAY) ×2 IMPLANT
KIT SUCTION CATH 14FR (SUCTIONS) ×2 IMPLANT
KIT TURNOVER KIT B (KITS) ×2 IMPLANT
KIT VASOVIEW HEMOPRO 2 VH 4000 (KITS) ×2 IMPLANT
KNIFE MICRO-UNI 3.5 30 DEG (BLADE) ×2 IMPLANT
NS IRRIG 1000ML POUR BTL (IV SOLUTION) ×10 IMPLANT
PACK E OPEN HEART (SUTURE) ×2 IMPLANT
PACK OPEN HEART (CUSTOM PROCEDURE TRAY) ×2 IMPLANT
PAD ARMBOARD 7.5X6 YLW CONV (MISCELLANEOUS) ×4 IMPLANT
PAD ELECT DEFIB RADIOL ZOLL (MISCELLANEOUS) ×2 IMPLANT
PENCIL BUTTON HOLSTER BLD 10FT (ELECTRODE) ×2 IMPLANT
POSITIONER HEAD DONUT 9IN (MISCELLANEOUS) ×2 IMPLANT
PUNCH AORTIC ROTATE 4.0MM (MISCELLANEOUS) IMPLANT
PUNCH AORTIC ROTATE 4.5MM 8IN (MISCELLANEOUS) ×2 IMPLANT
PUNCH AORTIC ROTATE 5MM 8IN (MISCELLANEOUS) IMPLANT
SET MPS 3-ND DEL (MISCELLANEOUS) IMPLANT
SPONGE INTESTINAL PEANUT (DISPOSABLE) IMPLANT
SPONGE T-LAP 18X18 ~~LOC~~+RFID (SPONGE) IMPLANT
SPONGE T-LAP 4X18 ~~LOC~~+RFID (SPONGE) IMPLANT
SUPPORT HEART JANKE-BARRON (MISCELLANEOUS) ×2 IMPLANT
SUT BONE WAX W31G (SUTURE) ×2 IMPLANT
SUT MNCRL AB 4-0 PS2 18 (SUTURE) IMPLANT
SUT PROLENE 3 0 SH DA (SUTURE) IMPLANT
SUT PROLENE 3 0 SH1 36 (SUTURE) ×2 IMPLANT
SUT PROLENE 4 0 RB 1 (SUTURE) ×4
SUT PROLENE 4 0 SH DA (SUTURE) IMPLANT
SUT PROLENE 4-0 RB1 .5 CRCL 36 (SUTURE) IMPLANT
SUT PROLENE 5 0 C 1 36 (SUTURE) IMPLANT
SUT PROLENE 6 0 C 1 30 (SUTURE) IMPLANT
SUT PROLENE 7 0 BV 1 (SUTURE) IMPLANT
SUT PROLENE 7 0 BV1 MDA (SUTURE) ×2 IMPLANT
SUT PROLENE 8 0 BV175 6 (SUTURE) IMPLANT
SUT SILK 1 MH (SUTURE) IMPLANT
SUT SILK 2 0 SH (SUTURE) IMPLANT
SUT SILK 2 0 SH CR/8 (SUTURE) IMPLANT
SUT STEEL 6MS V (SUTURE) IMPLANT
SUT STEEL STERNAL CCS#1 18IN (SUTURE) IMPLANT
SUT STEEL SZ 6 DBL 3X14 BALL (SUTURE) IMPLANT
SUT VIC AB 1 CTX 36 (SUTURE) ×8
SUT VIC AB 1 CTX36XBRD ANBCTR (SUTURE) ×4 IMPLANT
SUT VIC AB 2-0 CT1 27 (SUTURE) ×2
SUT VIC AB 2-0 CT1 TAPERPNT 27 (SUTURE) IMPLANT
SUT VIC AB 2-0 CTX 27 (SUTURE) IMPLANT
SUT VIC AB 3-0 SH 27 (SUTURE)
SUT VIC AB 3-0 SH 27X BRD (SUTURE) IMPLANT
SUT VIC AB 3-0 X1 27 (SUTURE) IMPLANT
SUT VICRYL 4-0 PS2 18IN ABS (SUTURE) IMPLANT
SYSTEM SAHARA CHEST DRAIN ATS (WOUND CARE) ×2 IMPLANT
TAPE CLOTH SURG 4X10 WHT LF (GAUZE/BANDAGES/DRESSINGS) IMPLANT
TAPE PAPER 2X10 WHT MICROPORE (GAUZE/BANDAGES/DRESSINGS) IMPLANT
TOWEL GREEN STERILE (TOWEL DISPOSABLE) ×2 IMPLANT
TOWEL GREEN STERILE FF (TOWEL DISPOSABLE) ×2 IMPLANT
TRAY FOLEY SLVR 16FR TEMP STAT (SET/KITS/TRAYS/PACK) ×2 IMPLANT
TUBE CONNECTING 20X1/4 (TUBING) IMPLANT
TUBING LAP HI FLOW INSUFFLATIO (TUBING) ×2 IMPLANT
UNDERPAD 30X36 HEAVY ABSORB (UNDERPADS AND DIAPERS) ×2 IMPLANT
WATER STERILE IRR 1000ML POUR (IV SOLUTION) ×4 IMPLANT

## 2023-03-09 NOTE — Transfer of Care (Signed)
Immediate Anesthesia Transfer of Care Note  Patient: Emily Farmer  Procedure(s) Performed: CORONARY ARTERY BYPASS GRAFTING (CABG) TIMES THREE USING THE LEFT INTERNAL MAMMARY ARTERY (LIMA) AND ENDOSCOPICALLY HARVESTED RIGHT GREATER SAPHENOUS VEIN (Chest) TRANSESOPHAGEAL ECHOCARDIOGRAM  Patient Location: ICU  Anesthesia Type:General  Level of Consciousness: sedated and unresponsive  Airway & Oxygen Therapy: Patient remains intubated per anesthesia plan and Patient placed on Ventilator (see vital sign flow sheet for setting)  Post-op Assessment: Report given to RN and Post -op Vital signs reviewed and stable  Post vital signs: Reviewed and stable  Last Vitals:  Vitals Value Taken Time  BP    Temp 36.3 C 03/09/23 1310  Pulse 80 03/09/23 1310  Resp 16 03/09/23 1310  SpO2 100 % 03/09/23 1310  Vitals shown include unfiled device data.  Last Pain:  Vitals:   03/09/23 0502  TempSrc: (P) Oral  PainSc:       Patients Stated Pain Goal: 0 (03/07/23 2032)  Complications: No notable events documented.

## 2023-03-09 NOTE — Progress Notes (Addendum)
Patient ID: Emily Farmer, female   DOB: 08-16-1955, 67 y.o.   MRN: [id number]     Advanced Heart Failure Rounding Note  PCP-Cardiologist: Chrystie Nose, MD   Subjective:    S/p 3vCABG today (03/09/23) SVG to the LAD, SVG to PL, SVG to PDA   Intubated and sedated. CVP last 23 intra-op. On Neo @ 20  Swann #'s CO 2.53 CI 1.42 CVP 23? PA 42/21 (30) PAPi 0.91  Objective:   RHC (9/20):  Hemodynamics (mmHg) RA mean 3 RV 47/4 PA 47/11, mean 28 PCWP mean 14 with v-waves to 26 (prominent V-waves) Oxygen saturations: PA 61% AO 100% Cardiac Output (Fick) 4.21  Cardiac Index (Fick) 2.37 PVR 3.3 WU   Weight Range: 75.5 kg Body mass index is 29.48 kg/m.   Vital Signs:   Temp:  [97.5 F (36.4 C)-97.6 F (36.4 C)] (P) 97.6 F (36.4 C) (09/23 0502) Pulse Rate:  [65-79] (P) 65 (09/23 0502) Resp:  [16-18] 16 (09/23 1305) BP: (94-121)/(51-63) 94/51 (09/23 1305) SpO2:  [93 %-99 %] 99 % (09/23 1305) FiO2 (%):  [50 %] 50 % (09/23 1305) Last BM Date : 03/07/23  Weight change: Filed Weights   03/07/23 0411 03/08/23 0500 03/08/23 0653  Weight: 75.2 kg 75.5 kg 75.5 kg    Intake/Output:   Intake/Output Summary (Last 24 hours) at 03/09/2023 1415 Last data filed at 03/09/2023 1251 Gross per 24 hour  Intake 3168.6 ml  Output 2925 ml  Net 243.6 ml     Physical Exam   General:  Intubated and sedated HEENT: +ETT, +OG Neck: supple. JVD elevated. Carotids 2+ bilat; no bruits. No lymphadenopathy or thyromegaly appreciated. Cor: PMI nondisplaced. Regular rate & rhythm. No rubs, gallops or murmurs. Mid sternal incision with bandage Lungs: clear Abdomen: soft, nontender, nondistended. No hepatosplenomegaly. No bruits or masses. Good bowel sounds. Extremities: no cyanosis, clubbing, rash, trace BLE edema. R leg wrapped GU: +foley Neuro: sedated  Telemetry   Paced 80s (personally reviewed)  EKG    N/A  Labs    CBC Recent Labs    03/08/23 0443 03/09/23 0451  03/09/23 0801 03/09/23 1050 03/09/23 1157 03/09/23 1201  WBC 4.9 5.5  --   --   --   --   HGB 10.8* 10.7*   < > 7.7* 8.2* 8.5*  HCT 33.8* 33.6*   < > 23.6* 24.0* 25.0*  MCV 87.3 88.2  --   --   --   --   PLT 200 214  --  152  --   --    < > = values in this interval not displayed.   Basic Metabolic Panel Recent Labs    13/08/65 0443 03/09/23 0451 03/09/23 0801 03/09/23 1036 03/09/23 1157 03/09/23 1201  NA 134* 134*   < > 134* 134* 133*  K 3.7 4.1   < > 4.6 4.6 4.6  CL 96* 97*   < > 97*  --  97*  CO2 25 24  --   --   --   --   GLUCOSE 125* 187*   < > 88  --  213*  BUN 51* 48*   < > 40*  --  35*  CREATININE 1.48* 1.49*   < > 1.10*  --  1.00  CALCIUM 9.1 9.0  --   --   --   --    < > = values in this interval not displayed.   Liver Function Tests No results for input(s): "AST", "ALT", "ALKPHOS", "  BILITOT", "PROT", "ALBUMIN" in the last 72 hours. No results for input(s): "LIPASE", "AMYLASE" in the last 72 hours. Cardiac Enzymes No results for input(s): "CKTOTAL", "CKMB", "CKMBINDEX", "TROPONINI" in the last 72 hours.  BNP: BNP (last 3 results) No results for input(s): "BNP" in the last 8760 hours.  ProBNP (last 3 results) No results for input(s): "PROBNP" in the last 8760 hours.   D-Dimer No results for input(s): "DDIMER" in the last 72 hours. Hemoglobin A1C No results for input(s): "HGBA1C" in the last 72 hours.  Fasting Lipid Panel No results for input(s): "CHOL", "HDL", "LDLCALC", "TRIG", "CHOLHDL", "LDLDIRECT" in the last 72 hours.  Thyroid Function Tests No results for input(s): "TSH", "T4TOTAL", "T3FREE", "THYROIDAB" in the last 72 hours.  Invalid input(s): "FREET3"  Other results:   Imaging    EP STUDY  Result Date: 03/09/2023 See surgical note for result.  DG Chest 2 View  Result Date: 03/09/2023 CLINICAL DATA:  [id number].  Chest pain.  Preop testing. EXAM: CHEST - 2 VIEW COMPARISON:  Portable chest 03/01/2023 FINDINGS: Chronic postsurgical  changes and associated pleural-parenchymal scarring in the lateral right base with mildly elevated right diaphragm. The lungs are clear of infiltrates. No pleural effusion is seen. There is mild cardiomegaly without CHF. The mediastinum is normally outlined. Left chest single lead AICD and ventricular wire insertion are unaltered. No new or acute osseous findings. Mild osteopenia. Spondylosis thoracic spine. IMPRESSION: No evidence of acute chest disease. Chronic postsurgical changes and pleural-parenchymal scarring in the lateral right base. Mild cardiomegaly. Stable chest. Electronically Signed   By: Almira Bar M.D.   On: 03/09/2023 03:09     Medications:     Scheduled Medications:  [START ON 03/10/2023] acetaminophen  1,000 mg Oral Q6H   Or   [START ON 03/10/2023] acetaminophen (TYLENOL) oral liquid 160 mg/5 mL  1,000 mg Per Tube Q6H   acetaminophen (TYLENOL) oral liquid 160 mg/5 mL  650 mg Per Tube Once   aspirin  324 mg Oral Once   [START ON 03/10/2023] aspirin EC  325 mg Oral Daily   Or   [START ON 03/10/2023] aspirin  324 mg Per Tube Daily   atorvastatin  40 mg Oral Daily   [START ON 03/10/2023] bisacodyl  10 mg Oral Daily   Or   [START ON 03/10/2023] bisacodyl  10 mg Rectal Daily   chlorhexidine  15 mL Mouth/Throat NOW   [START ON 03/10/2023] docusate sodium  200 mg Oral Daily   fenofibrate  160 mg Oral Daily   ferrous sulfate  325 mg Oral Q breakfast   metoCLOPramide (REGLAN) injection  10 mg Intravenous Q6H   metoprolol tartrate  12.5 mg Oral BID   Or   metoprolol tartrate  12.5 mg Per Tube BID   [START ON 03/11/2023] pantoprazole  40 mg Oral Daily   pantoprazole (PROTONIX) IV  40 mg Intravenous QHS   [START ON 03/10/2023] sodium chloride flush  3 mL Intravenous Q12H    Infusions:  sodium chloride     [START ON 03/10/2023] sodium chloride     sodium chloride 20 mL/hr at 03/09/23 1300   albumin human Stopped (03/09/23 1354)    ceFAZolin (ANCEF) IV 2 g (03/09/23 1358)    dexmedetomidine (PRECEDEX) IV infusion 0.7 mcg/kg/hr (03/09/23 1300)   insulin 4.8 Units/hr (03/09/23 1300)   lactated ringers     lactated ringers 20 mL/hr at 03/09/23 1338   magnesium sulfate 4 g (03/09/23 1330)   phenylephrine (NEO-SYNEPHRINE) Adult infusion 10 mcg/min (  03/09/23 1300)   potassium chloride 10 mEq (03/09/23 1328)   vancomycin      PRN Medications: sodium chloride, albumin human, dextrose, metoprolol tartrate, midazolam, morphine injection, ondansetron (ZOFRAN) IV, oxyCODONE, [START ON 03/10/2023] sodium chloride flush, traMADol    Patient Profile  Emily Farmer is a 67 year old with a history of DMII, HLD, CKD Stage IIIa, permanent  A Fib, 01/02/2023 AV node ablation  and single chamber PPM.    Admitted with chest pain. Cath showed RA 19, PCWP 36, elevated PA pressures, PVR 2.9, CO 4.7, CI 2.7.  Assessment/Plan  1. Acute on chronic diastolic CHF with prominent RV failure: Echo this admission with EF 60-65% with D-shaped interventricular septum, moderately dilated RV with mild RV dysfunction, mild MR, moderate TR, dilated IVC.  RHC on 9/17 showed mean RA 20, PA 82/28 mean 50, mean PCWP 36 with v-waves to 50, CI 2.57, PVR 2.9 WU => elevated right and left heart filling pressures with severe primarily pulmonary venous hypertension.  RHC today showed significant improvement with normal filling pressures and mild pulmonary hypertension. Prominent v-waves on PCWP tracing, suspect this may be due to diastolic dysfunction as mitral regurgitation only mild on echo.  - CVP last 23? May need diuretic prior to extubation. Will discuss with team.  - Hold off on SGLT2 inhibitor with UTI.  - On neo at 20, cut back as able.  - Start levo at 5, titrate for MAP of 70 - SCr back to baseline. Can add GDMT tomorrow 2. CAD: Unstable angina at presentation, severe 3VD on cath. Diffuse RCA disease with subtotal occlusion and collaterals to PDA from left, 90% mid LAD stenosis, 80% mid LCx stenosis.  She  is a diabetic.  Dr. Laneta Simmers has seen for CABG evaluation, he is concerned about her elevated PA pressure and is concerned that he may be unable to graft her RCA/PDA system which is likely the culprit for her presentation. The disease in this vessel is also too extensive to be approached percutaneously.  RHC showed significant improvement in PA pressure (mild pulmonary hypertension now).  - S/p 3vCABG today (03/09/23) SVG to the LAD, SVG to PL, SVG to PDA  - Continue statin - Can continue current Toprol XL and Imdur. 3.  Pulmonary hypertension: Severe pulmonary venous hypertension with PVR only 2.9 WU on 9/17 cath.  Repeat RHC with mild pulmonary hypertension (significant improvement with diuresis), PVR now 3.3.  OSA may contribute.    4. OSA: Continue CPAP.  5. Atrial fibrillation: Permanent.  S/p AV nodal ablation with PPM (has left bundle lead).  Paced QRS is narrow due to LB lead.   - Stopped diltiazem CD, not needed for rate control given AV nodal ablation.  - off hep gtt 6. AKI on CKD stage 3: Creatinine  higher than baseline, 1.49 => 1.32 => 1.53 => 1.79>1 today.  Suspect baseline diabetic nephropathy and now with aggressive diuresis.  - May need diuresis today vs tomorrow 7. Type 2 diabetes: SSI for now.   Alen Bleacher AGACNP-BC  03/09/2023 2:15 PM  Agree with above.   Patient just back from CABG  Remains intubated and sedated. In NSR.   CI 1.4 despite several albumins. EF normal in OR. CTs dry  General: Intubated/sedated HEENT: +ETT Neck: supple. RIJ swan  Cor: Sternal dressing Regular rate & rhythm. +CTs Lungs: clear Abdomen: soft, nontender, nondistended. No hepatosplenomegaly. No bruits or masses. Good bowel sounds. Extremities: no cyanosis, clubbing, rash, 1+ edema Neuro: sedated on  vent  CI low immediately post-op. D/w Dr. Laneta Simmers will support with low-dose NE> With normal EF suspect will rebound quickly. Will follow closely.   CRITICAL CARE Performed by: Arvilla Meres  Total critical care time: 40 minutes  Critical care time was exclusive of separately billable procedures and treating other patients.  Critical care was necessary to treat or prevent imminent or life-threatening deterioration.  Critical care was time spent personally by me (independent of midlevel providers or residents) on the following activities: development of treatment plan with patient and/or surrogate as well as nursing, discussions with consultants, evaluation of patient's response to treatment, examination of patient, obtaining history from patient or surrogate, ordering and performing treatments and interventions, ordering and review of laboratory studies, ordering and review of radiographic studies, pulse oximetry and re-evaluation of patient's condition.  Arvilla Meres, MD  7:18 PM

## 2023-03-09 NOTE — Anesthesia Procedure Notes (Signed)
Arterial Line Insertion Start/End9/23/2024 6:45 AM, 03/09/2023 7:00 AM Performed by: CRNA  Patient location: Pre-op. Preanesthetic checklist: patient identified, IV checked, site marked, risks and benefits discussed, surgical consent, monitors and equipment checked, pre-op evaluation and timeout performed Lidocaine 1% used for infiltration Left, radial was placed Catheter size: 20 G Hand hygiene performed , maximum sterile barriers used  and Seldinger technique used Allen's test indicative of satisfactory collateral circulation Attempts: 1 Procedure performed without using ultrasound guided technique. Ultrasound Notes:anatomy identified, needle tip was noted to be adjacent to the nerve/plexus identified and no ultrasound evidence of intravascular and/or intraneural injection Following insertion, Biopatch and dressing applied. Post procedure assessment: unchanged  Patient tolerated the procedure well with no immediate complications.

## 2023-03-09 NOTE — Op Note (Signed)
CARDIOVASCULAR SURGERY OPERATIVE NOTE  03/09/2023  Surgeon:  [name], MD  First Assistant: Doree Fudge,  PA-C:   An experienced assistant was required given the complexity of this surgery and the standard of surgical care. The assistant was needed for endoscopic vein harvest, exposure, dissection, suctioning, retraction of delicate tissues and sutures, instrument exchange and for overall help during this procedure.   Preoperative Diagnosis:  Severe multi-vessel coronary artery disease   Postoperative Diagnosis:  Same   Procedure:  Median Sternotomy Extracorporeal circulation 3.   Coronary artery bypass grafting x 3  SVG to the LAD      LIMA very small with slow blood flow. Not suitable. SVG to PL (LCX) SVG to PDA  4.   Endoscopic vein harvest from the right leg   Anesthesia:  General Endotracheal   Clinical History/Surgical Indication:  This 67 year old woman reports a history of some chronic shortness of breath that is worsened recently and been associated with substernal chest discomfort prompting admission.  Her troponin was low at 19 and 29.  Electrocardiogram showed a ventricular paced rhythm.  2D echocardiogram shows an ejection fraction of 60 to 65% with elevated left atrial pressure.  Right ventricular systolic function was mildly reduced with moderate RV enlargement and severely elevated pulmonary systolic pressure.  The aortic valve was calcified and sclerotic without stenosis.  Cardiac catheterization showed three-vessel coronary disease.  The culprit appeared to be a diffusely diseased RCA with 99% proximal stenosis and subtotal occlusion with TIMI II flow.  The mid to distal RCA had 90% stenosis in the distal RCA 70% stenosis.  There is an 80% mid left circumflex stenosis prior to a distal obtuse marginal branch.  The LAD had 50% mid vessel stenosis and a 90% mid to  distal stenosis that was focal.  There is small second diagonal branch with a 70% stenosis.  There is severe pulmonary hypertension at 82/28 with a mean of 50.  Mean wedge pressure was 36.  LVEDP was 20.  Mean right atrial pressure was 19.  Cardiac index was 2.57.  The mean aortic valve gradient was 5 mm. She was diuresed and a repeat RHC showed a marked improvement with a drop in PA pressure to 47/11 with a mean PCWP of 14, PVR 3.3 WU, mean RA 3. Since she improved significantly I thought that CABG would be the best option for her. I discussed the operative procedure with the patient including alternatives, benefits and risks; including but not limited to bleeding, blood transfusion, infection, stroke, myocardial infarction, graft failure, heart block requiring a permanent pacemaker, organ dysfunction, and death.  Emily Farmer understands and agrees to proceed.     Preparation:  The patient was seen in the preoperative holding area and the correct patient, correct operation were confirmed with the patient after reviewing the medical record and catheterization. The consent was signed by me. Preoperative antibiotics were given. A pulmonary arterial line and radial arterial line were placed by the anesthesia team. The patient was taken back to the operating room and positioned supine on the operating room table. After being placed under general endotracheal anesthesia by the anesthesia team a foley catheter was placed. The neck, chest, abdomen, and both legs were prepped with betadine soap and solution and draped in the usual sterile manner. A surgical time-out was taken and the correct patient and operative procedure were confirmed with the nursing and anesthesia staff.   Cardiopulmonary Bypass:  A median sternotomy was performed. The pericardium was  opened in the midline. Right ventricular function appeared normal. The ascending aorta was of normal size and had no palpable plaque. There were no  contraindications to aortic cannulation or cross-clamping. The patient was fully systemically heparinized and the ACT was maintained > 400 sec. The proximal aortic arch was cannulated with a 20 F aortic cannula for arterial inflow. Venous cannulation was performed via the right atrial appendage using a two-staged venous cannula. An antegrade cardioplegia/vent cannula was inserted into the mid-ascending aorta. Aortic occlusion was performed with a single cross-clamp. Systemic cooling to 32 degrees Centigrade and topical cooling of the heart with iced saline were used. Hyperkalemic antegrade cold blood cardioplegia was used to induce diastolic arrest and was then given at about 20 minute intervals throughout the period of arrest to maintain myocardial temperature at or below 10 degrees centigrade. A temperature probe was inserted into the interventricular septum and an insulating pad was placed in the pericardium.   Left internal mammary artery harvest:  The left side of the sternum was retracted using the Rultract retractor. The left internal mammary artery was harvested as a pedicle graft. All side branches were clipped. It was a very small-sized vessel of good quality but slow blood flow. Internal diameter was 1 mm. It was sprayed with papaverine solution but did not dilate. I did not feel that it would be adequate to use as conduit. It was ligated distally and divided.It was ligated proximally with a 2-0 silk suture and divided. It was not used.   Endoscopic vein harvest: Performed by Doree Fudge, PA-C  The right greater saphenous vein was harvested endoscopically through a 2 cm incision medial to the right knee. It was harvested from the upper thigh to below the knee. It was a medium-sized vein of good quality. The side branches were all ligated with 4-0 silk ties.    Coronary arteries:  The coronary arteries were examined.  LAD:  small caliber vessel with diffuse proximal and mid vessel  disease. There was some segmental disease extending out to the apex. LCX:  Large PL with no distal disease. RCA:  diffusely diseased and this extended out into the PDA. The distal PDA was small and diffusely diseased and I could only find one segment in the mid portion that was suitable for grafting.   Grafts: All graft performed by me and assisted by Doree Fudge, PA-C  SVG to the LAD: 1.6 mm. It was sewn end to side using 7-0 prolene continuous suture. SVG to PL LCx:  1.75 mm. It was sewn end to side using 7-0 prolene continuous suture. SVG to PDA:  1.6 mm. It was sewn end to side using 7-0 prolene continuous suture.   The proximal vein graft anastomoses were performed to the mid-ascending aorta using continuous 6-0 prolene suture. Graft markers were placed around the proximal anastomoses.   Completion:  The patient was rewarmed to 37 degrees Centigrade. The clamp was removed from the LIMA pedicle and there was rapid warming of the septum and return of ventricular fibrillation. The crossclamp was removed with a time of 74 minutes. There was spontaneous return of sinus rhythm. The distal and proximal anastomoses were checked for hemostasis. The position of the grafts was satisfactory. Two temporary epicardial pacing wires were placed on the right atrium and two on the right ventricle. The patient was weaned from CPB without difficulty on no inotropes. CPB time was 95 minutes. Cardiac output was 4 LPM. TEE showed normal LV systolic function with mild  MR. Heparin was fully reversed with protamine and the aortic and venous cannulas removed. Hemostasis was achieved. Mediastinal and left pleural drainage tubes were placed. The sternum was closed with  #6 stainless steel wires. The fascia was closed with continuous # 1 vicryl suture. The subcutaneous tissue was closed with 2-0 vicryl continuous suture. The skin was closed with 3-0 vicryl subcuticular suture. All sponge, needle, and instrument  counts were reported correct at the end of the case. Dry sterile dressings were placed over the incisions and around the chest tubes which were connected to pleurevac suction. The patient was then transported to the surgical intensive care unit in stable condition.

## 2023-03-09 NOTE — Procedures (Signed)
Extubation Procedure Note  Patient Details:   Name: Emily Farmer DOB: 10-Mar-1956 MRN: [id number]   Airway Documentation:    Vent end date: 03/09/23 Vent end time: 1806   Evaluation  O2 sats: stable throughout Complications: No apparent complications Patient did tolerate procedure well. Bilateral Breath Sounds: Clear   Yes  Patient was extubated to a 4L  without any complications, dyspnea or stridor noted. NIF: -20, positive cuff leak prior to extubation. Patient refused to do VC. MD was made aware by RN & verbal orders were given to proceed with extubation. Patient was instructed on IS by RN.   Carlynn Spry 03/09/2023, 6:08 PM

## 2023-03-09 NOTE — Interval H&P Note (Signed)
History and Physical Interval Note:  03/09/2023 6:40 AM  Emily Farmer  has presented today for surgery, with the diagnosis of CAD.  The various methods of treatment have been discussed with the patient and family. After consideration of risks, benefits and other options for treatment, the patient has consented to  Procedure(s): CORONARY ARTERY BYPASS GRAFTING (CABG) (N/A) TRANSESOPHAGEAL ECHOCARDIOGRAM (N/A) as a surgical intervention.  The patient's history has been reviewed, patient examined, no change in status, stable for surgery.  I have reviewed the patient's chart and labs.  Questions were answered to the patient's satisfaction.     [name]

## 2023-03-09 NOTE — Plan of Care (Signed)
Problem: Education: Goal: Knowledge of cardiac device and self-care will improve Outcome: Progressing Goal: Ability to safely manage health related needs after discharge will improve Outcome: Progressing Goal: Individualized Educational Video(s) Outcome: Progressing   Problem: Cardiac: Goal: Ability to achieve and maintain adequate cardiopulmonary perfusion will improve Outcome: Progressing   Problem: Education: Goal: Understanding of disease, treatment, and recovery process will improve Outcome: Progressing   Problem: Activity: Goal: Ability to return to baseline activity level will improve Outcome: Progressing   Problem: Cardiac: Goal: Ability to maintain adequate cardiovascular perfusion will improve Outcome: Progressing Goal: Vascular access site(s) Level 0-1 will be maintained Outcome: Progressing   Problem: Health Behavior/ Discharge Planning: Goal: Ability to safely manage health related needs after discharge Outcome: Progressing   Problem: Education: Goal: Understanding of cardiac disease, CV risk reduction, and recovery process will improve Outcome: Progressing Goal: Individualized Educational Video(s) Outcome: Progressing   Problem: Activity: Goal: Ability to tolerate increased activity will improve Outcome: Progressing   Problem: Cardiac: Goal: Ability to achieve and maintain adequate cardiovascular perfusion will improve Outcome: Progressing   Problem: Health Behavior/Discharge Planning: Goal: Ability to safely manage health-related needs after discharge will improve Outcome: Progressing   Problem: Education: Goal: Ability to describe self-care measures that may prevent or decrease complications (Diabetes Survival Skills Education) will improve Outcome: Progressing Goal: Individualized Educational Video(s) Outcome: Progressing   Problem: Coping: Goal: Ability to adjust to condition or change in health will improve Outcome: Progressing   Problem:  Fluid Volume: Goal: Ability to maintain a balanced intake and output will improve Outcome: Progressing   Problem: Health Behavior/Discharge Planning: Goal: Ability to identify and utilize available resources and services will improve Outcome: Progressing Goal: Ability to manage health-related needs will improve Outcome: Progressing   Problem: Metabolic: Goal: Ability to maintain appropriate glucose levels will improve Outcome: Progressing   Problem: Nutritional: Goal: Maintenance of adequate nutrition will improve Outcome: Progressing Goal: Progress toward achieving an optimal weight will improve Outcome: Progressing   Problem: Skin Integrity: Goal: Risk for impaired skin integrity will decrease Outcome: Progressing   Problem: Tissue Perfusion: Goal: Adequacy of tissue perfusion will improve Outcome: Progressing   Problem: Education: Goal: Knowledge of General Education information will improve Description: Including pain rating scale, medication(s)/side effects and non-pharmacologic comfort measures Outcome: Progressing   Problem: Health Behavior/Discharge Planning: Goal: Ability to manage health-related needs will improve Outcome: Progressing   Problem: Clinical Measurements: Goal: Ability to maintain clinical measurements within normal limits will improve Outcome: Progressing Goal: Will remain free from infection Outcome: Progressing Goal: Diagnostic test results will improve Outcome: Progressing Goal: Respiratory complications will improve Outcome: Progressing Goal: Cardiovascular complication will be avoided Outcome: Progressing   Problem: Activity: Goal: Risk for activity intolerance will decrease Outcome: Progressing   Problem: Nutrition: Goal: Adequate nutrition will be maintained Outcome: Progressing   Problem: Coping: Goal: Level of anxiety will decrease Outcome: Progressing   Problem: Elimination: Goal: Will not experience complications related  to bowel motility Outcome: Progressing Goal: Will not experience complications related to urinary retention Outcome: Progressing   Problem: Pain Managment: Goal: General experience of comfort will improve Outcome: Progressing   Problem: Safety: Goal: Ability to remain free from injury will improve Outcome: Progressing   Problem: Skin Integrity: Goal: Risk for impaired skin integrity will decrease Outcome: Progressing   Problem: Education: Goal: Understanding of CV disease, CV risk reduction, and recovery process will improve Outcome: Progressing Goal: Individualized Educational Video(s) Outcome: Progressing   Problem: Activity: Goal: Ability to return to baseline  activity level will improve Outcome: Progressing   Problem: Cardiovascular: Goal: Ability to achieve and maintain adequate cardiovascular perfusion will improve Outcome: Progressing Goal: Vascular access site(s) Level 0-1 will be maintained Outcome: Progressing

## 2023-03-09 NOTE — Discharge Summary (Signed)
[address]       [place] [address]             [phone]    Physician Discharge Summary  Patient ID: Emily Farmer MRN: [id number] DOB/AGE: Jan 28, 1956 67 y.o.  Admit date: 03/01/2023 Discharge date: 03/18/2023  Admission Diagnoses:  Patient Active Problem List   Diagnosis Date Noted   Coronary artery disease 03/09/2023   Coronary artery disease involving native coronary artery of native heart with unstable angina pectoris (HCC) 03/05/2023   Accelerating angina (HCC) 03/01/2023   Stage 3b chronic kidney disease (HCC) 11/18/2022   Proliferative diabetic retinopathy of both eyes (HCC) 10/29/2022   Peripheral arterial disease (HCC) 10/14/2022   Polyneuropathy associated with underlying disease (HCC) 08/22/2022   Type 2 diabetes mellitus with peripheral neuropathy (HCC) 08/22/2022   Pacemaker 06/03/2022   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   Bacteremia 04/01/2021   Atrial fibrillation with slow ventricular response (HCC) 03/31/2021   Pulmonary hypertension (HCC) 03/19/2021   Hypomagnesemia    Sepsis (HCC) 02/27/2021   Atrial fibrillation with rapid ventricular response (HCC) 02/27/2021   Hypotension 02/27/2021   Secondary hypercoagulable state (HCC) 05/08/2020   Dyslipidemia 09/23/2019   Toenail fungus 07/19/2018   Depression 07/19/2018   Iron deficiency anemia due to chronic blood loss 07/19/2018   Gastritis and duodenitis 07/19/2018   Chronic a-fib (HCC) 01/28/2018   Hyponatremia 01/28/2018   Chronic diastolic CHF (congestive heart failure) (HCC) 01/28/2018   Mixed hyperlipidemia 06/01/2017   Paroxysmal A-fib (HCC) 10/04/2016   Hypokalemia 01/04/2014   Acute pyelonephritis 02/15/2013   Essential hypertension 02/15/2013   Hypertriglyceridemia 02/15/2013   Coronary atherosclerosis seen on CT 02/15/2013   Discharge Diagnoses:  Patient Active Problem List   Diagnosis Date Noted   Permanent atrial fibrillation (HCC) 03/17/2023    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/2023   Coronary artery disease 03/09/2023   Coronary artery disease involving native coronary artery of native heart with unstable angina pectoris (HCC) 03/05/2023   Accelerating angina (HCC) 03/01/2023   Stage 3b chronic kidney disease (HCC) 11/18/2022   Proliferative diabetic retinopathy of both eyes (HCC) 10/29/2022   Peripheral arterial disease (HCC) 10/14/2022   Polyneuropathy associated with underlying disease (HCC) 08/22/2022   Type 2 diabetes mellitus with peripheral neuropathy (HCC) 08/22/2022   Pacemaker 06/03/2022   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   Bacteremia 04/01/2021   Atrial fibrillation with slow ventricular response (HCC) 03/31/2021   Pulmonary hypertension (HCC) 03/19/2021   Hypomagnesemia    Sepsis (HCC) 02/27/2021   Atrial fibrillation with rapid ventricular response (HCC) 02/27/2021   Hypotension 02/27/2021   Secondary hypercoagulable state (HCC) 05/08/2020   Dyslipidemia 09/23/2019   Toenail fungus 07/19/2018   Depression 07/19/2018   Iron deficiency anemia due to chronic blood loss 07/19/2018   Gastritis and duodenitis 07/19/2018   Chronic a-fib (HCC) 01/28/2018   Hyponatremia 01/28/2018   Chronic diastolic CHF (congestive heart failure) (HCC) 01/28/2018   Mixed hyperlipidemia 06/01/2017   Paroxysmal A-fib (HCC) 10/04/2016   Hypokalemia 01/04/2014   Acute pyelonephritis 02/15/2013   Essential hypertension 02/15/2013   Hypertriglyceridemia 02/15/2013   Coronary atherosclerosis seen on CT 02/15/2013   Discharged Condition: Stable  Hospital Course:  Dr. Laneta Simmers saw this 67 year old female in cardiothoracic surgical consultation for consideration of coronary artery surgical revascularization (CABG) on 03/04/2023 .  The patient presented to the emergency room on 03/01/2023 with symptoms of chest pain and dyspnea.  She has multiple cardiac risk factors including type 2 diabetes,  mixed hyperlipidemia, hypertriglyceridemia,  OSA on CPAP, hypertension and atrial fibrillation status post recent ablation and placement of single-chamber PPM.  She reports that since having the pacemaker placed she describes some heaviness in her chest with increasing shortness of breath/DOE.  Simple exercise including such things as walking to the mailbox lead to shortness of breath and chest discomfort.  She was felt to require admission for further diagnostic evaluation including chest CTA and cardiac catheterization.  She has severe hypertriglyceridemia with levels into the 1500s as well as previous history of acute pancreatitis.  She has very poor control of her diabetes with most recent hemoglobin A1c 10.2 but has been greater than 15 with significantly elevated levels dating back to 2013 in epic.  Her previous history of atrial fibrillation has been difficult to control and has undergone AV nodal ablation.  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  Cardiac catheterization revealed severe three-vessel coronary artery disease.  Dr. Laneta Simmers was concerned about her elevated PA pressure and is concerned that he may be unable to graft her RCA/PDA system which is likely the culprit for her presentation. Hemodynamic findings were consistent with severe pulmonary hypertension.  The advanced heart failure team was consulted to assist with optimization of cardiac function and initiation of GDMT.  Troponins have been mildly elevated.  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  Most recent creatinine is noted to be 1.32.  Previous CT scan showed significant atherosclerosis in the abdominal aorta.  It does not appear the CTA of the chest has been performed yet.  Echocardiogram showed EF of 60 to 65% with no regional wall motion abnormalities, mildly reduced RV function, severely elevated pulmonary artery systolic pressure, mild MR.  Eliquis is currently on hold and she is on a  heparin drip.  She underwent a right heart catheterization which showed mild MR seen on echo may be due to diastolic dysfunction and mild pulmonary arterial hypertension (improved from previously). Dr. Laneta Simmers discussed the need for coronary artery bypass grafting surgery. Potential risks, benefits, and complications of the surgery were discussed with the patient and she agreed to proceed with surgery.  Hospital Course: Patient underwent a CABG x 3. She was transferred from the OR to [facility] ICU in stable condition. She was extubated early the evening of surgery. She was weaned off Nor Epinephrine drip. She was AV pacing with temporary wires initially (s/p PPM for tachy brady syndrome, AV node ablation several months ago for a fib). Apixaban will be restarted after EPW removed. Theone Murdoch, a line, chest tubes were removed early in his post operative course. She was transitioned off the Insulin drip. Pre op HGA1C 8. Actos and Jardiance will be restarted soon. She will need close medical follow up after discharge. She was weaned off Nor epinephrine drip on post op day 2. She was volume overloaded and diuresed starting on post op day 2. PT consult was obtained to assist with ambulation. She was started on Midodrine 10 mg tid for labile BP. She had an increase in her creatinine so diuresis was held on post op day 3. EPW and foley were removed on 09/26. She will be restarted on Apixaban soon. She was felt stable for transfer from the ICU to 4E for further convalescence on 09/26.  Her blood sugars remained elevated and her insulin regimen was adjusted as indicated.  She was volume overloaded and started on Demadex.  She  had a good response to this.  Creatinine on postop day 6 did bump to 1.74 and diuresis was subsequently held although it is felt she will require more going forward.  The patient was noted to have a moderate left pleural effusion.  IR consult was obtained for Thoracentesis.  This was performed on 9/30 and  removed 300 cc of fluid.  Follow up CXR showed residual small left pleural effusion.  Her blood pressure was improving and her Midodrine was decreased to 5 mg TID.  Her creatinine level improved to 1.48 and she was resumed on Demadex for a few days.  She is participating with PT.  She is in her previously paced rhythm.  Her incisions are free from infection.  She is stable for discharge to SNF today.  Consults:  Advanced Heart Failure  Significant Diagnostic Studies: angiography:     Prox RCA lesion is 99% stenosed. Subtotal Occlusion - TIMI 2 flow   Mid RCA lesion is 80% stenosed.  Mid RCA to Dist RCA lesion is 90% stenosed. Dist RCA lesion is 70% stenosed.   Mid Cx lesion is 80% stenosed prior to distal OM-LPL   Mid LAD lesion is 50% stenosed. Dist LAD lesion is 90% stenosed.   2nd Diag lesion is 70% stenosed.   Hemodynamic findings consistent with severe pulmonary hypertension.   There is no aortic valve stenosis.     SUMMARY Multivessel disease-question if she would be a better candidate for CABG versus multivessel PCI given concerns for renal insufficiency and elevated PAP.     RECOMMENDATIONS Patient has multivessel disease, I will start her back on IV heparin 8 hours after sheath removal pending assessment for potential multivessel PCI versus CABG.  Gentle hydration with diuresis today, have consulted advanced heart failure team for assistance in management and TUNE-UP prior to consideration of CVTS consultation versus multivessel PCI.     Case discussed with Drs. Janne [place], Marca Ancona as well as the patient's sister [name].     Bryan Lemma, MD  Treatments: surgery:  Median Sternotomy Extracorporeal circulation 3.   Coronary artery bypass grafting x 3   SVG to the LAD      LIMA very small with slow blood flow. Not suitable. SVG to PL (LCX) SVG to PDA   4.   Endoscopic vein harvest from the right leg by Dr. Laneta Simmers on 03/09/2023   Discharge Exam:  BP  125/80 (BP Location: Right Arm)   Pulse 82   Temp 97.7 F (36.5 C) (Oral)   Resp 13   Ht 5\' 3"  (1.6 m)   Wt 78.8 kg   SpO2 95%   BMI 30.77 kg/m   General appearance: alert, cooperative, and no distress Heart: regular rate and rhythm and paced Lungs: diminished breath sounds bibasilar Abdomen: soft, non-tender; bowel sounds normal; no masses,  no organomegaly Extremities: edema trace Wound: clean and dry  Discharge Medications:  The patient has been discharged on:   1.Beta Blocker:  Yes [   ]                              No   [ X  ]                              If No, reason: low BP  2.Ace Inhibitor/ARB: Yes [   ]  No  [ x   ]                                     If No, reason: low BP, elevated creatinine  3.Statin:   Yes [  x ]                  No  [   ]                  If No, reason:  4.Ecasa:  Yes  [  x ]                  No   [   ]                  If No, reason:  Patient had ACS upon admission:No  Plavix/P2Y12 inhibitor: Yes [   ]                                      No  [  x ]      Allergies as of 03/18/2023       Reactions   Trulicity [dulaglutide] Diarrhea        Medication List     STOP taking these medications    diltiazem 120 MG 24 hr capsule Commonly known as: Cardizem CD   empagliflozin 25 MG Tabs tablet Commonly known as: JARDIANCE   metoprolol succinate 100 MG 24 hr tablet Commonly known as: TOPROL-XL       TAKE these medications    Accu-Chek Guide Me w/Device Kit Use to check blood sugar 3 times daily.   Accu-Chek Guide test strip Generic drug: glucose blood Use to check blood sugar 3 times daily.   Accu-Chek Softclix Lancets lancets Use to check blood sugar 3 times daily.   acetaminophen 325 MG tablet Commonly known as: TYLENOL Take 2 tablets (650 mg total) by mouth every 6 (six) hours as needed for mild pain (or Fever >/= 101).   aspirin EC 81 MG tablet Take 1 tablet (81 mg  total) by mouth daily. Swallow whole.   atorvastatin 40 MG tablet Commonly known as: LIPITOR Take 1 tablet (40 mg total) by mouth daily.   BD Pen Needle Nano 2nd Gen 32G X 4 MM Misc Generic drug: Insulin Pen Needle use in the morning, at noon, in the evening, and at bedtime.   Blood Pressure Monitor/Arm Devi Check blood pressure once per day at least 2 hours after medications.   CENTRUM SILVER PO Take 1 tablet by mouth daily.   Ocuvite Adult 50+ Caps Take 1 capsule by mouth daily.   ciclopirox 8 % solution Commonly known as: PENLAC Apply topically at bedtime. Apply over nail and surrounding skin. Apply daily over previous coat. After seven (7) days, may remove with alcohol and continue cycle.   Dexcom G7 Sensor Misc 1 Device by Does not apply route as directed.   diclofenac Sodium 1 % Gel Commonly known as: Voltaren Apply 2 g topically 4 (four) times daily. Apply across lower back for back pain What changed:  when to take this reasons to take this   Eliquis 5 MG Tabs tablet Generic drug: apixaban Take 1 tablet (5 mg total) by mouth in the morning and at bedtime.   fenofibrate  48 MG tablet Commonly known as: Tricor Take 1 tablet (48 mg total) by mouth daily.   FeroSul 325 (65 Fe) MG tablet Generic drug: ferrous sulfate Take 1 tablet (325 mg total) by mouth daily with breakfast.   HumaLOG KwikPen 100 UNIT/ML KwikPen Generic drug: insulin lispro Max daily 45 units What changed:  how much to take how to take this when to take this additional instructions   Insulin Syringe-Needle U-100 31G X 5/16" 0.3 ML Misc Commonly known as: TRUEplus Insulin Syringe Use to inject insulin daily. What changed:  how much to take how to take this when to take this   Lantus SoloStar 100 UNIT/ML Solostar Pen Generic drug: insulin glargine Inject 55 Units into the skin daily.   midodrine 5 MG tablet Commonly known as: PROAMATINE Take 1 tablet (5 mg total) by mouth 2 (two)  times daily with a meal.   oxyCODONE 5 MG immediate release tablet Commonly known as: Oxy IR/ROXICODONE Take 1 tablet (5 mg total) by mouth every 4 (four) hours as needed for severe pain.   pioglitazone 15 MG tablet Commonly known as: Actos Take 1 tablet (15 mg total) by mouth daily.   potassium chloride SA 20 MEQ tablet Commonly known as: KLOR-CON M Take 1 tablet (20 mEq total) by mouth daily.   torsemide 20 MG tablet Commonly known as: DEMADEX Take 1 tablet (20 mg total) by mouth daily.   Victoza 18 MG/3ML Sopn Generic drug: liraglutide Inject 1.8 mg into the skin daily.        Follow-up Information     [name], MD. Go on 04/08/2023.   Specialty: Cardiothoracic Surgery Why: Appointment time is at 9:30 am Contact information: [address] [place] [address] [phone]         Marcine Matar, MD. Call.   Specialty: Internal Medicine Why: for follow up regarding further diabetes management and surveillance of HGA1C 8 Contact information: [address] [place] [address] [phone]         [facility]. Go on 04/08/2023.   Why: Please arrive by 9:00 am in order to have PA/LAT CXR taken PRIOR to office appointment with Dr. Cristy Hilts information: [address] [place] [address]        [place]. Go on 03/23/2023.   Specialty: Cardiothoracic Surgery Why: Appointment time is at 11:00 am Contact information: [address] [place], [address] [place] [address] [phone]        [facility]. Go on 03/30/2023.   Why: Appointment time is at 2:20 pm Contact information: [address] [place], [place], [place] [address] [phone]                Signed:  Lowella Dandy, PA-C 03/18/2023, 8:03 AM

## 2023-03-09 NOTE — Progress Notes (Signed)
      [address]       [place] [address]             [phone]       S/p CABG x 3  Waking up   BP 123/73   Pulse 80   Temp 97.7 F (36.5 C)   Resp 16   Ht 5\' 3"  (1.6 m)   Wt 75.5 kg   SpO2 100%   BMI 29.48 kg/m  57/26 Ci 1.7 Norepi at 4   Intake/Output Summary (Last 24 hours) at 03/09/2023 1751 Last data filed at 03/09/2023 1700 Gross per 24 hour  Intake 4111.82 ml  Output 3730 ml  Net 381.82 ml  Minimal CT output  Hct 29  Doing well early postop Ready to extubate  Viviann Spare C. Dorris Fetch, MD [facility] [phone]

## 2023-03-09 NOTE — Anesthesia Procedure Notes (Signed)
Central Venous Catheter Insertion Performed by: Jairo Ben, MD, anesthesiologist Start/End9/23/2024 6:40 AM, 03/09/2023 6:52 AM Patient location: Pre-op. Preanesthetic checklist: patient identified, IV checked, risks and benefits discussed, surgical consent, monitors and equipment checked, pre-op evaluation, timeout performed and anesthesia consent Position: supine Lidocaine 1% used for infiltration and patient sedated Hand hygiene performed , maximum sterile barriers used  and Seldinger technique used Catheter size: 8.5 Fr PA cath was placed.Sheath introducer Swan type:thermodilution Procedure performed using ultrasound guided technique. Ultrasound Notes:anatomy identified and image(s) printed for medical record Attempts: 1 Following insertion, line sutured, dressing applied and Biopatch. Post procedure assessment: free fluid flow, blood return through all ports and no air  Patient tolerated the procedure well with no immediate complications. Additional procedure comments: PA catheter:  Routine monitors. Timeout, sterile prep, drape, FBP R neck.  Supine position.  1% Lido local, finder and trocar RIJ 1st pass with US guidance.  Cordis placed over J wire. PA catheter in easily.  Sterile dressing applied.  Patient tolerated well, VSS.  Sandford Craze, MD.

## 2023-03-09 NOTE — Transfer of Care (Deleted)
Immediate Anesthesia Transfer of Care Note  Patient: Emily Farmer  Procedure(s) Performed: CORONARY ARTERY BYPASS GRAFTING (CABG) TIMES THREE USING THE LEFT INTERNAL MAMMARY ARTERY (LIMA) AND ENDOSCOPICALLY HARVESTED RIGHT GREATER SAPHENOUS VEIN (Chest) TRANSESOPHAGEAL ECHOCARDIOGRAM  Patient Location: PACU  Anesthesia Type:General  Level of Consciousness: sedated and unresponsive  Airway & Oxygen Therapy: Patient remains intubated per anesthesia plan and Patient placed on Ventilator (see vital sign flow sheet for setting)  Post-op Assessment: Report given to RN and Post -op Vital signs reviewed and stable  Post vital signs: Reviewed and stable  Last Vitals:  Vitals Value Taken Time  BP    Temp 36.3 C 03/09/23 1309  Pulse 79 03/09/23 1309  Resp 16 03/09/23 1309  SpO2 100 % 03/09/23 1309  Vitals shown include unfiled device data.  Last Pain:  Vitals:   03/09/23 0502  TempSrc: (P) Oral  PainSc:       Patients Stated Pain Goal: 0 (03/07/23 2032)  Complications: No notable events documented.

## 2023-03-09 NOTE — Brief Op Note (Signed)
03/01/2023 - 03/09/2023  11:02 AM  PATIENT:  Emily Farmer  67 y.o. female  PRE-OPERATIVE DIAGNOSIS:  CORONARY ARTERY DISEASE  POST-OPERATIVE DIAGNOSIS:  CORONARY ARTERY DISEASE  PROCEDURE:  TRANSESOPHAGEAL ECHOCARDIOGRAM, CORONARY ARTERY BYPASS GRAFTING (CABG) x 4 (SVG to LAD, SVG to OM, SVG to PDA) with EVH from RIGHT GREATER SAPHENOUS VEIN HARVESTED ENDOSCOPICALLY  Vein harvest time: 47 min Vein prep time: 20 min  SURGEON:  Surgeons and Role:    [name], MD - Primary  PHYSICIAN ASSISTANT: Doree Fudge PA-C  ASSISTANTS: Tanda Rockers RNFA   ANESTHESIA:   general  EBL: Per anesthesia, perfusion record  DRAINS:  Chest tubes placed in the mediastinal and pleural spaces    COUNTS CORRECT:  YES  DICTATION: .Dragon Dictation  PLAN OF CARE: Admit to inpatient   PATIENT DISPOSITION:  ICU - intubated and hemodynamically stable.   Delay start of Pharmacological VTE agent (>24hrs) due to surgical blood loss or risk of bleeding: no  BASELINE WEIGHT: 75.5 kg

## 2023-03-09 NOTE — Anesthesia Procedure Notes (Signed)
Procedure Name: Intubation Date/Time: 03/09/2023 7:57 AM  Performed by: Marena Chancy, CRNAPre-anesthesia Checklist: Patient identified, Emergency Drugs available, Suction available, Patient being monitored and Timeout performed Patient Re-evaluated:Patient Re-evaluated prior to induction Oxygen Delivery Method: Circle system utilized Preoxygenation: Pre-oxygenation with 100% oxygen Induction Type: IV induction Ventilation: Mask ventilation without difficulty and Oral airway inserted - appropriate to patient size Laryngoscope Size: Mac and 4 Grade View: Grade I Tube type: Oral Tube size: 8.0 mm Number of attempts: 1 Airway Equipment and Method: Stylet Placement Confirmation: ETT inserted through vocal cords under direct vision, positive ETCO2, CO2 detector and breath sounds checked- equal and bilateral Secured at: 21 cm Tube secured with: Tape Dental Injury: Teeth and Oropharynx as per pre-operative assessment  Comments: Placed by C. Sosa SRNA

## 2023-03-10 ENCOUNTER — Encounter (HOSPITAL_COMMUNITY): Payer: Self-pay | Admitting: Surgery

## 2023-03-10 ENCOUNTER — Inpatient Hospital Stay (HOSPITAL_COMMUNITY): Payer: Medicare HMO

## 2023-03-10 DIAGNOSIS — I200 Unstable angina: Secondary | ICD-10-CM | POA: Diagnosis not present

## 2023-03-10 DIAGNOSIS — I5033 Acute on chronic diastolic (congestive) heart failure: Secondary | ICD-10-CM | POA: Diagnosis not present

## 2023-03-10 LAB — BASIC METABOLIC PANEL
Anion gap: 11 (ref 5–15)
Anion gap: 12 (ref 5–15)
BUN: 33 mg/dL — ABNORMAL HIGH (ref 8–23)
BUN: 30 mg/dL — ABNORMAL HIGH (ref 8–23)
CO2: 23 mmol/L (ref 22–32)
CO2: 22 mmol/L (ref 22–32)
Calcium: 8.2 mg/dL — ABNORMAL LOW (ref 8.9–10.3)
Calcium: 7.9 mg/dL — ABNORMAL LOW (ref 8.9–10.3)
Chloride: 100 mmol/L (ref 98–111)
Chloride: 97 mmol/L — ABNORMAL LOW (ref 98–111)
Creatinine, Ser: 1.33 mg/dL — ABNORMAL HIGH (ref 0.44–1.00)
Creatinine, Ser: 1.38 mg/dL — ABNORMAL HIGH (ref 0.44–1.00)
GFR, Estimated: 44 mL/min — ABNORMAL LOW (ref >60)
GFR, Estimated: 42 mL/min — ABNORMAL LOW (ref >60)
Glucose, Bld: 148 mg/dL — ABNORMAL HIGH (ref 70–99)
Glucose, Bld: 168 mg/dL — ABNORMAL HIGH (ref 70–99)
Potassium: 4.3 mmol/L (ref 3.5–5.1)
Potassium: 4 mmol/L (ref 3.5–5.1)
Sodium: 134 mmol/L — ABNORMAL LOW (ref 135–145)
Sodium: 131 mmol/L — ABNORMAL LOW (ref 135–145)

## 2023-03-10 LAB — CBC
HCT: 28.7 % — ABNORMAL LOW (ref 36.0–46.0)
HCT: 27 % — ABNORMAL LOW (ref 36.0–46.0)
Hemoglobin: 9 g/dL — ABNORMAL LOW (ref 12.0–15.0)
Hemoglobin: 8.4 g/dL — ABNORMAL LOW (ref 12.0–15.0)
MCH: 28.4 pg (ref 26.0–34.0)
MCH: 28.5 pg (ref 26.0–34.0)
MCHC: 31.4 g/dL (ref 30.0–36.0)
MCHC: 31.1 g/dL (ref 30.0–36.0)
MCV: 90.5 fL (ref 80.0–100.0)
MCV: 91.5 fL (ref 80.0–100.0)
Platelets: 179 10*3/uL (ref 150–400)
Platelets: 130 10*3/uL — ABNORMAL LOW (ref 150–400)
RBC: 3.17 MIL/uL — ABNORMAL LOW (ref 3.87–5.11)
RBC: 2.95 MIL/uL — ABNORMAL LOW (ref 3.87–5.11)
RDW: 15.9 % — ABNORMAL HIGH (ref 11.5–15.5)
RDW: 15.9 % — ABNORMAL HIGH (ref 11.5–15.5)
WBC: 12 10*3/uL — ABNORMAL HIGH (ref 4.0–10.5)
WBC: 9.1 10*3/uL (ref 4.0–10.5)
nRBC: 0 % (ref 0.0–0.2)
nRBC: 0 % (ref 0.0–0.2)

## 2023-03-10 LAB — GLUCOSE, CAPILLARY
Glucose-Capillary: 107 mg/dL — ABNORMAL HIGH (ref 70–99)
Glucose-Capillary: 123 mg/dL — ABNORMAL HIGH (ref 70–99)
Glucose-Capillary: 127 mg/dL — ABNORMAL HIGH (ref 70–99)
Glucose-Capillary: 138 mg/dL — ABNORMAL HIGH (ref 70–99)
Glucose-Capillary: 141 mg/dL — ABNORMAL HIGH (ref 70–99)
Glucose-Capillary: 142 mg/dL — ABNORMAL HIGH (ref 70–99)
Glucose-Capillary: 144 mg/dL — ABNORMAL HIGH (ref 70–99)
Glucose-Capillary: 150 mg/dL — ABNORMAL HIGH (ref 70–99)
Glucose-Capillary: 154 mg/dL — ABNORMAL HIGH (ref 70–99)
Glucose-Capillary: 155 mg/dL — ABNORMAL HIGH (ref 70–99)
Glucose-Capillary: 159 mg/dL — ABNORMAL HIGH (ref 70–99)
Glucose-Capillary: 160 mg/dL — ABNORMAL HIGH (ref 70–99)
Glucose-Capillary: 162 mg/dL — ABNORMAL HIGH (ref 70–99)
Glucose-Capillary: 166 mg/dL — ABNORMAL HIGH (ref 70–99)
Glucose-Capillary: 171 mg/dL — ABNORMAL HIGH (ref 70–99)
Glucose-Capillary: 88 mg/dL (ref 70–99)

## 2023-03-10 LAB — MAGNESIUM
Magnesium: 2.6 mg/dL — ABNORMAL HIGH (ref 1.7–2.4)
Magnesium: 2.5 mg/dL — ABNORMAL HIGH (ref 1.7–2.4)

## 2023-03-10 MED ORDER — INSULIN ASPART 100 UNIT/ML IJ SOLN
0.0000 [IU] | INTRAMUSCULAR | Status: DC
  Administered 2023-03-10: 2 [IU] via SUBCUTANEOUS
  Administered 2023-03-10: 4 [IU] via SUBCUTANEOUS
  Administered 2023-03-10: 2 [IU] via SUBCUTANEOUS

## 2023-03-10 MED ORDER — TRAMADOL HCL 50 MG PO TABS
50.0000 mg | ORAL_TABLET | ORAL | Status: DC | PRN
  Administered 2023-03-11 – 2023-03-16 (×4): 50 mg via ORAL
  Filled 2023-03-10 (×4): qty 1

## 2023-03-10 MED ORDER — ENOXAPARIN SODIUM 40 MG/0.4ML IJ SOSY
40.0000 mg | PREFILLED_SYRINGE | Freq: Every day | INTRAMUSCULAR | Status: DC
  Administered 2023-03-10: 40 mg via SUBCUTANEOUS
  Filled 2023-03-10: qty 0.4

## 2023-03-10 MED ORDER — INSULIN DETEMIR 100 UNIT/ML ~~LOC~~ SOLN
20.0000 [IU] | Freq: Every day | SUBCUTANEOUS | Status: DC
  Administered 2023-03-10 – 2023-03-12 (×3): 20 [IU] via SUBCUTANEOUS
  Filled 2023-03-10 (×4): qty 0.2

## 2023-03-10 MED FILL — Thrombin (Recombinant) For Soln 20000 Unit: CUTANEOUS | Qty: 1 | Status: AC

## 2023-03-10 NOTE — Anesthesia Postprocedure Evaluation (Signed)
Anesthesia Post Note  Patient: Public relations account executive  Procedure(s) Performed: CORONARY ARTERY BYPASS GRAFTING (CABG) TIMES THREE USING THE LEFT INTERNAL MAMMARY ARTERY (LIMA) AND ENDOSCOPICALLY HARVESTED RIGHT GREATER SAPHENOUS VEIN (Chest) TRANSESOPHAGEAL ECHOCARDIOGRAM     Patient location during evaluation: SICU Anesthesia Type: General Level of consciousness: awake and alert, patient cooperative and oriented Pain management: pain level controlled Vital Signs Assessment: post-procedure vital signs reviewed and stable Respiratory status: nonlabored ventilation, spontaneous breathing, respiratory function stable and patient connected to nasal cannula oxygen Cardiovascular status: stable (remains on low Norepi infusion) Postop Assessment: adequate PO intake (nausea from this morning resolved) Anesthetic complications: no   No notable events documented.  Last Vitals:  Vitals:   03/10/23 1600 03/10/23 1608  BP: 103/62   Pulse: 81   Resp: 14   Temp:  36.9 C  SpO2: 100%     Last Pain:  Vitals:   03/10/23 1608  TempSrc: Oral  PainSc:                  Darlys Buis,E. Norva Bowe

## 2023-03-10 NOTE — Plan of Care (Signed)
POD1: Patient progressing mobility nicely. Ambulated 300' using four wheel walker. O2 weaned to 2L via [place], Levo gtt weaned down. Insulin gtt off with stable CBGs. SGC and chest tubes removed.

## 2023-03-10 NOTE — Progress Notes (Signed)
1 Day Post-Op Procedure(s) (LRB): CORONARY ARTERY BYPASS GRAFTING (CABG) TIMES THREE USING THE LEFT INTERNAL MAMMARY ARTERY (LIMA) AND ENDOSCOPICALLY HARVESTED RIGHT GREATER SAPHENOUS VEIN (N/A) TRANSESOPHAGEAL ECHOCARDIOGRAM (N/A) Subjective: Complains of chest wall soreness. Otherwise feels ok  Objective: Vital signs in last 24 hours: Temp:  [97 F (36.1 C)-98.6 F (37 C)] 98.2 F (36.8 C) (09/24 0618) Pulse Rate:  [78-93] 93 (09/24 0618) Cardiac Rhythm: A-V Sequential paced (09/24 0400) Resp:  [12-25] 19 (09/24 0618) BP: (90-126)/(48-81) 105/65 (09/24 0600) SpO2:  [97 %-100 %] 98 % (09/24 0618) Arterial Line BP: (78-133)/(46-74) 133/68 (09/24 0618) FiO2 (%):  [40 %-50 %] 40 % (09/23 1725) Weight:  [76.3 kg] 76.3 kg (09/24 0500)  Hemodynamic parameters for last 24 hours: PAP: (38-62)/(13-29) 51/19 CO:  [2.4 L/min-3.6 L/min] 3.3 L/min CI:  [1.4 L/min/m2-2 L/min/m2] 1.8 L/min/m2  Intake/Output from previous day: 09/23 0701 - 09/24 0700 In: 5225.4 [I.V.:2837; Blood:446; NG/GT:20; IV Piggyback:1922.4] Out: 3722 [Urine:2505; Blood:800; Chest Tube:417] Intake/Output this shift: Total I/O In: 1372.6 [I.V.:726.2; IV Piggyback:646.4] Out: 835 [Urine:595; Chest Tube:240]  General appearance: alert and cooperative Neurologic: intact Heart: regular rate and rhythm Lungs: clear to auscultation bilaterally Extremities: edema mild Wound: dressings dry  Lab Results: Recent Labs    03/09/23 1850 03/09/23 1901 03/10/23 0352  WBC 13.7*  --  12.0*  HGB 9.6* 10.2* 9.0*  HCT 30.3* 30.0* 28.7*  PLT 164  --  179   BMET:  Recent Labs    03/09/23 1850 03/09/23 1901 03/10/23 0352  NA 137 136 134*  K 4.7 4.7 4.3  CL 104  --  100  CO2 23  --  23  GLUCOSE 167*  --  148*  BUN 35*  --  33*  CREATININE 1.35*  --  1.33*  CALCIUM 7.9*  --  8.2*    PT/INR:  Recent Labs    03/09/23 1314  LABPROT 17.4*  INR 1.4*   ABG    Component Value Date/Time   PHART 7.394 03/09/2023  1901   HCO3 22.2 03/09/2023 1901   TCO2 23 03/09/2023 1901   ACIDBASEDEF 2.0 03/09/2023 1901   O2SAT 100 03/09/2023 1901   CBG (last 3)  Recent Labs    03/10/23 0356 03/10/23 0506 03/10/23 0613  GLUCAP 144* 138* 159*   CXR: bibasilar atelectasis   Assessment/Plan: S/P Procedure(s) (LRB): CORONARY ARTERY BYPASS GRAFTING (CABG) TIMES THREE USING THE LEFT INTERNAL MAMMARY ARTERY (LIMA) AND ENDOSCOPICALLY HARVESTED RIGHT GREATER SAPHENOUS VEIN (N/A) TRANSESOPHAGEAL ECHOCARDIOGRAM (N/A)  POD 1  Hemodynamically stable on NE 6. Wean as BP allows. EF after surgery normal.  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 She is AV pacing with temp wires now and when this if off she is internally V-paced at 60 with drop in BP. Will continue DDD pacing for now.  Wt is only 2 lbs over preop. Hold off on diuresis for now.  DM: transition to levemir and SSI.  Dangle and DC chest tubes, swan. DC arterial line today.  Plan to resume Eliquis after pacing wire removal.  IS, OOB, mobilize.   LOS: 6 days    [name] 03/10/2023

## 2023-03-10 NOTE — Progress Notes (Addendum)
Patient ID: Emily Farmer, female   DOB: 1955/11/13, 67 y.o.   MRN: [id number]     Advanced Heart Failure Rounding Note  PCP-Cardiologist: Chrystie Nose, MD   Subjective:    S/p 3vCABG (03/09/23) SVG to the LAD, SVG to PL, SVG to PDA   CVP unhooked. Weight only up 2lbs from pre-op. Remains on 5 of norepi  Central access pulled this morning. CI last 2.   Feels ok this morning, just having some back pain from CT placement. Getting pain meds to pull CT later today.   Objective:   RHC (9/20):  Hemodynamics (mmHg) RA mean 3 RV 47/4 PA 47/11, mean 28 PCWP mean 14 with v-waves to 26 (prominent V-waves) Oxygen saturations: PA 61% AO 100% Cardiac Output (Fick) 4.21  Cardiac Index (Fick) 2.37 PVR 3.3 WU   Weight Range: 76.3 kg Body mass index is 29.8 kg/m.   Vital Signs:   Temp:  [97 F (36.1 C)-98.6 F (37 C)] 98.2 F (36.8 C) (09/24 0730) Pulse Rate:  [78-93] 82 (09/24 0841) Resp:  [12-25] 20 (09/24 0841) BP: (90-126)/(48-81) 118/74 (09/24 0800) SpO2:  [97 %-100 %] 99 % (09/24 0841) Arterial Line BP: (78-137)/(46-74) 122/58 (09/24 0841) FiO2 (%):  [40 %-50 %] 40 % (09/23 1725) Weight:  [76.3 kg] 76.3 kg (09/24 0500) Last BM Date : 03/07/23  Weight change: Filed Weights   03/08/23 0500 03/08/23 0653 03/10/23 0500  Weight: 75.5 kg 75.5 kg 76.3 kg    Intake/Output:   Intake/Output Summary (Last 24 hours) at 03/10/2023 0853 Last data filed at 03/10/2023 0800 Gross per 24 hour  Intake 5445.87 ml  Output 3592 ml  Net 1853.87 ml     Physical Exam  General:  elderly appearing.  No respiratory difficulty HEENT: normal. +Industry Neck: supple. JVD ~8 cm. Carotids 2+ bilat; no bruits. No lymphadenopathy or thyromegaly appreciated. Cor: PMI nondisplaced. Regular rate & rhythm. No rubs, gallops or murmurs. Lungs: clear Abdomen: soft, nontender, nondistended. No hepatosplenomegaly. No bruits or masses. Good bowel sounds. Extremities: no cyanosis, clubbing, rash, trace BLE  edema  Neuro: alert & oriented x 3, cranial nerves grossly intact. moves all 4 extremities w/o difficulty. Affect pleasant.   Telemetry   Paced 90s (personally reviewed)  EKG    N/A  Labs    CBC Recent Labs    03/09/23 1850 03/09/23 1901 03/10/23 0352  WBC 13.7*  --  12.0*  HGB 9.6* 10.2* 9.0*  HCT 30.3* 30.0* 28.7*  MCV 91.0  --  90.5  PLT 164  --  179   Basic Metabolic Panel Recent Labs    78/29/56 1850 03/09/23 1901 03/10/23 0352  NA 137 136 134*  K 4.7 4.7 4.3  CL 104  --  100  CO2 23  --  23  GLUCOSE 167*  --  148*  BUN 35*  --  33*  CREATININE 1.35*  --  1.33*  CALCIUM 7.9*  --  8.2*  MG 3.0*  --  2.6*   Liver Function Tests No results for input(s): "AST", "ALT", "ALKPHOS", "BILITOT", "PROT", "ALBUMIN" in the last 72 hours. No results for input(s): "LIPASE", "AMYLASE" in the last 72 hours. Cardiac Enzymes No results for input(s): "CKTOTAL", "CKMB", "CKMBINDEX", "TROPONINI" in the last 72 hours.  BNP: BNP (last 3 results) No results for input(s): "BNP" in the last 8760 hours.  ProBNP (last 3 results) No results for input(s): "PROBNP" in the last 8760 hours.   D-Dimer No results for input(s): "DDIMER" in  the last 72 hours. Hemoglobin A1C No results for input(s): "HGBA1C" in the last 72 hours.  Fasting Lipid Panel No results for input(s): "CHOL", "HDL", "LDLCALC", "TRIG", "CHOLHDL", "LDLDIRECT" in the last 72 hours.  Thyroid Function Tests No results for input(s): "TSH", "T4TOTAL", "T3FREE", "THYROIDAB" in the last 72 hours.  Invalid input(s): "FREET3"  Other results:   Imaging    DG Chest Port 1 View  Result Date: 03/09/2023 CLINICAL DATA:  Pneumothorax postop EXAM: PORTABLE CHEST 1 VIEW COMPARISON:  03/08/2023, 03/01/2023 FINDINGS: Interval intubation, tip of the endotracheal tube is about 3.5 cm superior to carina. Esophageal tube tip below the diaphragm, but is incompletely visualized. Right IJ Swan-Ganz catheter tip over the pulmonary  outflow tract. Interval sternotomy with placement of mediastinal and chest drainage catheters. Possible trace left apical pneumothorax. Mild airspace disease at the right greater than left lung base likely atelectasis. Enlarged cardiomediastinal silhouette. Similar left-sided single lead pacing device. IMPRESSION: 1. Interval sternotomy with placement of lines and tubes as above. Possible trace left apical pneumothorax. 2. Mild right greater than left basilar atelectasis. Electronically Signed   By: Jasmine Pang M.D.   On: 03/09/2023 15:51   EP STUDY  Result Date: 03/09/2023 See surgical note for result.    Medications:     Scheduled Medications:  acetaminophen  1,000 mg Oral Q6H   Or   acetaminophen (TYLENOL) oral liquid 160 mg/5 mL  1,000 mg Per Tube Q6H   aspirin EC  325 mg Oral Daily   Or   aspirin  324 mg Per Tube Daily   atorvastatin  40 mg Oral Daily   bisacodyl  10 mg Oral Daily   Or   bisacodyl  10 mg Rectal Daily   Chlorhexidine Gluconate Cloth  6 each Topical Daily   docusate sodium  200 mg Oral Daily   enoxaparin (LOVENOX) injection  40 mg Subcutaneous QHS   fenofibrate  160 mg Oral Daily   ferrous sulfate  325 mg Oral Q breakfast   insulin aspart  0-24 Units Subcutaneous Q4H   insulin detemir  20 Units Subcutaneous Daily   metoCLOPramide (REGLAN) injection  10 mg Intravenous Q6H   [START ON 03/11/2023] pantoprazole  40 mg Oral Daily   pantoprazole (PROTONIX) IV  40 mg Intravenous QHS   sodium chloride flush  3 mL Intravenous Q12H    Infusions:  sodium chloride Stopped (03/10/23 0734)   sodium chloride     sodium chloride 20 mL/hr at 03/10/23 0800    ceFAZolin (ANCEF) IV Stopped (03/10/23 0558)   lactated ringers     lactated ringers 10 mL/hr at 03/10/23 0800   norepinephrine (LEVOPHED) Adult infusion 6 mcg/min (03/10/23 0800)    PRN Medications: sodium chloride, dextrose, morphine injection, ondansetron (ZOFRAN) IV, mouth rinse, oxyCODONE, sodium chloride  flush, traMADol    Patient Profile  Emily Farmer is a 67 year old with a history of DMII, HLD, CKD Stage IIIa, permanent  A Fib, 01/02/2023 AV node ablation  and single chamber PPM.    Admitted with chest pain. Cath showed RA 19, PCWP 36, elevated PA pressures, PVR 2.9, CO 4.7, CI 2.7.  Assessment/Plan  1. Acute on chronic diastolic CHF with prominent RV failure: Echo this admission with EF 60-65% with D-shaped interventricular septum, moderately dilated RV with mild RV dysfunction, mild MR, moderate TR, dilated IVC.  RHC on 9/17 showed mean RA 20, PA 82/28 mean 50, mean PCWP 36 with v-waves to 50, CI 2.57, PVR 2.9 WU =>  elevated right and left heart filling pressures with severe primarily pulmonary venous hypertension.  RHC today showed significant improvement with normal filling pressures and mild pulmonary hypertension. Prominent v-waves on PCWP tracing, suspect this may be due to diastolic dysfunction as mitral regurgitation only mild on echo.   - Volume appears stable. Can consider PO lasix later today, will follow.  - Hold off on SGLT2 inhibitor with UTI.  - Neo off 9/23. Norepi at 5, wean down as tolerated.  - SCr back to baseline.  2. CAD: Unstable angina at presentation, severe 3VD on cath. Diffuse RCA disease with subtotal occlusion and collaterals to PDA from left, 90% mid LAD stenosis, 80% mid LCx stenosis.  She is a diabetic.  Dr. Laneta Simmers has seen for CABG evaluation, he is concerned about her elevated PA pressure and is concerned that he may be unable to graft her RCA/PDA system which is likely the culprit for her presentation. The disease in this vessel is also too extensive to be approached percutaneously.  RHC showed significant improvement in PA pressure (mild pulmonary hypertension now).  - S/p 3vCABG today (03/09/23) SVG to the LAD, SVG to PL, SVG to PDA  - Continue statin - Can continue current Toprol XL and Imdur. 3.  Pulmonary hypertension: Severe pulmonary venous hypertension with  PVR only 2.9 WU on 9/17 cath.  Repeat RHC with mild pulmonary hypertension (significant improvement with diuresis), PVR now 3.3.  OSA may contribute.    4. OSA: Continue CPAP.  5. Atrial fibrillation: Permanent.  S/p AV nodal ablation with PPM (has left bundle lead).  Paced QRS is narrow due to LB lead.   - Stopped diltiazem CD, not needed for rate control given AV nodal ablation.  - Plan to resume eliquis when pacing wires pulled 6. AKI on CKD stage 3: Creatinine  higher than baseline, 1.49 => 1.32 => 1.53 => 1.79> 1> 1.33 today.  Suspect baseline diabetic nephropathy and now with aggressive diuresis.  - May need diuresis today vs tomorrow 7. Type 2 diabetes: SSI for now.   Alen Bleacher AGACNP-BC  03/10/2023 8:53 AM

## 2023-03-11 ENCOUNTER — Inpatient Hospital Stay (HOSPITAL_COMMUNITY): Payer: Medicare HMO

## 2023-03-11 DIAGNOSIS — I5043 Acute on chronic combined systolic (congestive) and diastolic (congestive) heart failure: Secondary | ICD-10-CM | POA: Diagnosis not present

## 2023-03-11 DIAGNOSIS — I4821 Permanent atrial fibrillation: Secondary | ICD-10-CM | POA: Diagnosis not present

## 2023-03-11 DIAGNOSIS — I272 Pulmonary hypertension, unspecified: Secondary | ICD-10-CM | POA: Diagnosis not present

## 2023-03-11 LAB — BASIC METABOLIC PANEL
Anion gap: 10 (ref 5–15)
BUN: 27 mg/dL — ABNORMAL HIGH (ref 8–23)
CO2: 23 mmol/L (ref 22–32)
Calcium: 8 mg/dL — ABNORMAL LOW (ref 8.9–10.3)
Chloride: 100 mmol/L (ref 98–111)
Creatinine, Ser: 1.24 mg/dL — ABNORMAL HIGH (ref 0.44–1.00)
GFR, Estimated: 48 mL/min — ABNORMAL LOW (ref >60)
Glucose, Bld: 91 mg/dL (ref 70–99)
Potassium: 4 mmol/L (ref 3.5–5.1)
Sodium: 133 mmol/L — ABNORMAL LOW (ref 135–145)

## 2023-03-11 LAB — CBC
HCT: 26.7 % — ABNORMAL LOW (ref 36.0–46.0)
Hemoglobin: 8.2 g/dL — ABNORMAL LOW (ref 12.0–15.0)
MCH: 28.2 pg (ref 26.0–34.0)
MCHC: 30.7 g/dL (ref 30.0–36.0)
MCV: 91.8 fL (ref 80.0–100.0)
Platelets: 140 10*3/uL — ABNORMAL LOW (ref 150–400)
RBC: 2.91 MIL/uL — ABNORMAL LOW (ref 3.87–5.11)
RDW: 15.7 % — ABNORMAL HIGH (ref 11.5–15.5)
WBC: 8.5 10*3/uL (ref 4.0–10.5)
nRBC: 0 % (ref 0.0–0.2)

## 2023-03-11 LAB — GLUCOSE, CAPILLARY
Glucose-Capillary: 84 mg/dL (ref 70–99)
Glucose-Capillary: 133 mg/dL — ABNORMAL HIGH (ref 70–99)
Glucose-Capillary: 247 mg/dL — ABNORMAL HIGH (ref 70–99)
Glucose-Capillary: 165 mg/dL — ABNORMAL HIGH (ref 70–99)
Glucose-Capillary: 157 mg/dL — ABNORMAL HIGH (ref 70–99)

## 2023-03-11 MED ORDER — POTASSIUM CHLORIDE CRYS ER 20 MEQ PO TBCR
20.0000 meq | EXTENDED_RELEASE_TABLET | Freq: Every day | ORAL | Status: DC
  Administered 2023-03-11: 20 meq via ORAL
  Filled 2023-03-11: qty 1

## 2023-03-11 MED ORDER — INSULIN ASPART 100 UNIT/ML IJ SOLN
0.0000 [IU] | Freq: Three times a day (TID) | INTRAMUSCULAR | Status: DC
  Administered 2023-03-11: 8 [IU] via SUBCUTANEOUS
  Administered 2023-03-11: 4 [IU] via SUBCUTANEOUS
  Administered 2023-03-12: 8 [IU] via SUBCUTANEOUS
  Administered 2023-03-12 – 2023-03-13 (×3): 4 [IU] via SUBCUTANEOUS
  Administered 2023-03-13: 12 [IU] via SUBCUTANEOUS
  Administered 2023-03-14: 4 [IU] via SUBCUTANEOUS
  Administered 2023-03-14: 2 [IU] via SUBCUTANEOUS
  Administered 2023-03-15: 8 [IU] via SUBCUTANEOUS
  Administered 2023-03-15: 2 [IU] via SUBCUTANEOUS
  Administered 2023-03-16: 8 [IU] via SUBCUTANEOUS
  Administered 2023-03-17 – 2023-03-18 (×2): 12 [IU] via SUBCUTANEOUS

## 2023-03-11 MED ORDER — FUROSEMIDE 40 MG PO TABS
40.0000 mg | ORAL_TABLET | Freq: Every day | ORAL | Status: DC
  Administered 2023-03-11: 40 mg via ORAL
  Filled 2023-03-11: qty 1

## 2023-03-11 MED ORDER — MIDODRINE HCL 5 MG PO TABS
10.0000 mg | ORAL_TABLET | Freq: Three times a day (TID) | ORAL | Status: DC
  Administered 2023-03-11 – 2023-03-15 (×11): 10 mg via ORAL
  Filled 2023-03-11 (×12): qty 2

## 2023-03-11 MED FILL — Magnesium Sulfate Inj 50%: INTRAMUSCULAR | Qty: 10 | Status: AC

## 2023-03-11 MED FILL — Potassium Chloride Inj 2 mEq/ML: INTRAVENOUS | Qty: 40 | Status: AC

## 2023-03-11 MED FILL — Heparin Sodium (Porcine) Inj 1000 Unit/ML: Qty: 1000 | Status: AC

## 2023-03-11 NOTE — Progress Notes (Signed)
Advanced Heart Failure Rounding Note  PCP-Cardiologist: Chrystie Nose, MD   Subjective:     Sitting up in bed, resting quietly, walked this AM.    Objective:   Weight Range: 79.7 kg Body mass index is 31.13 kg/m.   Vital Signs:   Temp:  [97.7 F (36.5 C)-98.4 F (36.9 C)] 98.3 F (36.8 C) (09/25 0756) Pulse Rate:  [80-95] 85 (09/25 1030) Resp:  [10-25] 15 (09/25 1030) BP: (90-141)/(45-97) 130/65 (09/25 1030) SpO2:  [86 %-100 %] 100 % (09/25 1030) Arterial Line BP: (78-147)/(42-65) 118/54 (09/25 1030) Weight:  [79.7 kg] 79.7 kg (09/25 0600) Last BM Date : 03/07/23  Weight change: Filed Weights   03/08/23 0653 03/10/23 0500 03/11/23 0600  Weight: 75.5 kg 76.3 kg 79.7 kg    Intake/Output:   Intake/Output Summary (Last 24 hours) at 03/11/2023 1053 Last data filed at 03/11/2023 1000 Gross per 24 hour  Intake 1766.99 ml  Output 1690 ml  Net 76.99 ml      Physical Exam    General:  Chronically ill appearing HEENT: Normal Cor: RRR, no m/g/r, mildly elevated JVP, trace LE edea Lungs: Diminished breath sounds Abdomen: Soft, nontender, nondistended. No hepatosplenomegaly. No bruits or masses. Good bowel sounds. Extremities: No cyanosis, clubbing, rash, edema Neuro: Alert & orientedx3, cranial nerves grossly intact. moves all 4 extremities w/o difficulty. Affect pleasant   Telemetry   Paced rhythm in the 80s  Patient Profile   Emily Farmer is a 67 year old with a history of DMII, HLD, CKD Stage IIIa, permanent A Fib, 01/02/2023 AV node ablation and single chamber PPM who presented with chest pain and found to have multivessel disease with significantly elevated PA pressures. She is s/p 3v CABG on 9/23.    Assessment/Plan   Acute on chronic diastolic heart failure with prominent RV dysfunction: RHC on 9/17 showed mean RA 20, PA 82/28 mean 50, mean PCWP 36 with v-waves to 50, CI 2.57, PVR 2.9 WU => elevated right and left heart filling pressures with severe  primarily pulmonary venous hypertension. Improved with diuresis prior to surgery. Unclear if weight gain was obtained standing, but agree with restarting diuresis. Would prefer IV lasix in the acute setting post op, so if patient not responding to oral would give 40mg  IV this afternoon. - Lasix 40mg  daily given, if no response would give 40mg  IV - Hold off on SGLT2 inhibitor with UTI.  - Neo off 9/23. Norepi at 3, wean down as tolerated.  - SCr back to baseline.  CAD: S/p 3v CABG(03/09/23) SVG to the LAD, SVG to PL, SVG to PDA: - Continue statin - Holding metoprolol until off pressors  Pulmonary hypertension: Severe pulmonary venous hypertension with PVR only 2.9 WU on 9/17 cath. Repeat RHC with mild pulmonary hypertension (significant improvement with diuresis), PVR now 3.3. OSA may contribute.  - CPAP - Diuresis as above  OSA - Continue CPAP  Atrial fibrillation: Permanent. S/p AV nodal ablation with PPM (has left bundle lead). Paced QRS is narrow due to LB lead.  - Increase device settings to 80bpm while on pressors  AKI on CKD stage 3: Cardiorenal, improved with diuresis.   Length of Stay: 7  Romie Minus, MD  03/11/2023, 10:53 AM  Advanced Heart Failure Team Pager [phone] (M-F; 7a - 5p)  Please contact [facility] for night-coverage after hours (5p -7a ) and weekends on [email or web address]  CRITICAL CARE Performed by: Romie Minus   Total critical care time: 53  minutes  Critical care time was exclusive of separately billable procedures and treating other patients.  Critical care was necessary to treat or prevent imminent or life-threatening deterioration.  Critical care was time spent personally by me on the following activities: development of treatment plan with patient and/or surrogate as well as nursing, discussions with consultants, evaluation of patient's response to treatment, examination of patient, obtaining history from patient or surrogate, ordering and  performing treatments and interventions, ordering and review of laboratory studies, ordering and review of radiographic studies, pulse oximetry and re-evaluation of patient's condition.

## 2023-03-11 NOTE — Plan of Care (Signed)
Problem: Education: Goal: Knowledge of cardiac device and self-care will improve Outcome: Progressing Goal: Ability to safely manage health related needs after discharge will improve Outcome: Progressing Goal: Individualized Educational Video(s) Outcome: Progressing   Problem: Cardiac: Goal: Ability to achieve and maintain adequate cardiopulmonary perfusion will improve Outcome: Progressing   Problem: Education: Goal: Understanding of disease, treatment, and recovery process will improve Outcome: Progressing   Problem: Activity: Goal: Ability to return to baseline activity level will improve Outcome: Progressing   Problem: Cardiac: Goal: Ability to maintain adequate cardiovascular perfusion will improve Outcome: Progressing Goal: Vascular access site(s) Level 0-1 will be maintained Outcome: Progressing   Problem: Health Behavior/ Discharge Planning: Goal: Ability to safely manage health related needs after discharge Outcome: Progressing   Problem: Education: Goal: Understanding of cardiac disease, CV risk reduction, and recovery process will improve Outcome: Progressing Goal: Individualized Educational Video(s) Outcome: Progressing   Problem: Activity: Goal: Ability to tolerate increased activity will improve Outcome: Progressing   Problem: Cardiac: Goal: Ability to achieve and maintain adequate cardiovascular perfusion will improve Outcome: Progressing   Problem: Health Behavior/Discharge Planning: Goal: Ability to safely manage health-related needs after discharge will improve Outcome: Progressing   Problem: Education: Goal: Ability to describe self-care measures that may prevent or decrease complications (Diabetes Survival Skills Education) will improve Outcome: Progressing Goal: Individualized Educational Video(s) Outcome: Progressing   Problem: Coping: Goal: Ability to adjust to condition or change in health will improve Outcome: Progressing   Problem:  Fluid Volume: Goal: Ability to maintain a balanced intake and output will improve Outcome: Progressing   Problem: Health Behavior/Discharge Planning: Goal: Ability to identify and utilize available resources and services will improve Outcome: Progressing Goal: Ability to manage health-related needs will improve Outcome: Progressing   Problem: Metabolic: Goal: Ability to maintain appropriate glucose levels will improve Outcome: Progressing   Problem: Nutritional: Goal: Maintenance of adequate nutrition will improve Outcome: Progressing Goal: Progress toward achieving an optimal weight will improve Outcome: Progressing   Problem: Skin Integrity: Goal: Risk for impaired skin integrity will decrease Outcome: Progressing   Problem: Tissue Perfusion: Goal: Adequacy of tissue perfusion will improve Outcome: Progressing   Problem: Education: Goal: Knowledge of General Education information will improve Description: Including pain rating scale, medication(s)/side effects and non-pharmacologic comfort measures Outcome: Progressing   Problem: Health Behavior/Discharge Planning: Goal: Ability to manage health-related needs will improve Outcome: Progressing   Problem: Clinical Measurements: Goal: Ability to maintain clinical measurements within normal limits will improve Outcome: Progressing Goal: Will remain free from infection Outcome: Progressing Goal: Diagnostic test results will improve Outcome: Progressing Goal: Respiratory complications will improve Outcome: Progressing Goal: Cardiovascular complication will be avoided Outcome: Progressing   Problem: Activity: Goal: Risk for activity intolerance will decrease Outcome: Progressing   Problem: Nutrition: Goal: Adequate nutrition will be maintained Outcome: Progressing   Problem: Coping: Goal: Level of anxiety will decrease Outcome: Progressing   Problem: Elimination: Goal: Will not experience complications related  to bowel motility Outcome: Progressing Goal: Will not experience complications related to urinary retention Outcome: Progressing   Problem: Pain Managment: Goal: General experience of comfort will improve Outcome: Progressing   Problem: Safety: Goal: Ability to remain free from injury will improve Outcome: Progressing   Problem: Skin Integrity: Goal: Risk for impaired skin integrity will decrease Outcome: Progressing   Problem: Education: Goal: Understanding of CV disease, CV risk reduction, and recovery process will improve Outcome: Progressing Goal: Individualized Educational Video(s) Outcome: Progressing   Problem: Activity: Goal: Ability to return to baseline  activity level will improve Outcome: Progressing   Problem: Cardiovascular: Goal: Ability to achieve and maintain adequate cardiovascular perfusion will improve Outcome: Progressing Goal: Vascular access site(s) Level 0-1 will be maintained Outcome: Progressing

## 2023-03-11 NOTE — Progress Notes (Addendum)
Dr. Laneta Simmers at bedside and turned epicardial pacer off. Patient is V-paced at rate of 80. This RN nor patient has seen [employer] rep today.MD aware.  MD also aware that patient doesn't feel well enough to walk and that levophed drip was restarted recently after it has been off for much of the day due to BP dropping while sitting up in chair and patient complaining of feeling weak and faint.

## 2023-03-11 NOTE — TOC Progression Note (Addendum)
Transition of Care [facility]) - Progression Note    Patient Details  Name: Emily Farmer MRN: [id number] Date of Birth: Oct 18, 1955  Transition of Care [facility]) CM/SW Contact  Elliot Cousin, RN Phone Number: [phone] 03/11/2023, 4:08 PM  Clinical Narrative: Spoke to pt at bedside. States she use public transportation. States she should have transportation with [employer]. States she sister has transportation. She does not like to use because it may get confused with her sister transportation. Explained to pt that their benefits are separate and that she should qualify under her plan. Pt states she will need rehab before going back to apt. Will need PT/OT recommendation.      Expected Discharge Plan: IP Rehab Facility Barriers to Discharge: Continued Medical Work up  Expected Discharge Plan and Services   Discharge Planning Services: CM Consult Post Acute Care Choice: IP Rehab Living arrangements for the past 2 months: Apartment                                       Social Determinants of Health (SDOH) Interventions SDOH Screenings   Food Insecurity: No Food Insecurity (03/02/2023)  Housing: Low Risk  (03/02/2023)  Transportation Needs: No Transportation Needs (03/02/2023)  Utilities: Not At Risk (03/02/2023)  Alcohol Screen: Low Risk  (09/15/2022)  Depression (PHQ2-9): Low Risk  (11/18/2022)  Financial Resource Strain: Medium Risk (09/15/2022)  Physical Activity: Insufficiently Active (09/15/2022)  Social Connections: Socially Isolated (09/15/2022)  Stress: Stress Concern Present (07/21/2022)  Tobacco Use: Low Risk  (03/01/2023)    Readmission Risk Interventions    04/03/2021    2:32 PM  Readmission Risk Prevention Plan  Transportation Screening Complete  PCP or Specialist Appt within 5-7 Days Complete  Home Care Screening Complete  Medication Review (RN CM) Complete

## 2023-03-11 NOTE — Progress Notes (Signed)
EVENING ROUNDS NOTE :     [address]       [place] [id number]             [phone]                 2 Days Post-Op Procedure(s) (LRB): CORONARY ARTERY BYPASS GRAFTING (CABG) TIMES THREE USING THE LEFT INTERNAL MAMMARY ARTERY (LIMA) AND ENDOSCOPICALLY HARVESTED RIGHT GREATER SAPHENOUS VEIN (N/A) TRANSESOPHAGEAL ECHOCARDIOGRAM (N/A)   Total Length of Stay:  LOS: 7 days  Events:   Stable day    BP (!) 100/54   Pulse 80   Temp 98.3 F (36.8 C) (Oral)   Resp 18   Ht 5\' 3"  (1.6 m)   Wt 79.7 kg   SpO2 99%   BMI 31.13 kg/m          sodium chloride Stopped (03/10/23 0734)   sodium chloride     sodium chloride 20 mL/hr at 03/11/23 1900   lactated ringers     lactated ringers 10 mL/hr at 03/11/23 1900   norepinephrine (LEVOPHED) Adult infusion 2 mcg/min (03/11/23 1900)    I/O last 3 completed shifts: In: 2567.6 [P.O.:840; I.V.:1436.1; IV Piggyback:291.4] Out: 2695 [Urine:2675; Chest Tube:20]      Latest Ref Rng & Units 03/11/2023    3:22 AM 03/10/2023    4:15 PM 03/10/2023    3:52 AM  CBC  WBC 4.0 - 10.5 K/uL 8.5  9.1  12.0   Hemoglobin 12.0 - 15.0 g/dL 8.2  8.4  9.0   Hematocrit 36.0 - 46.0 % 26.7  27.0  28.7   Platelets 150 - 400 K/uL 140  130  179        Latest Ref Rng & Units 03/11/2023    3:22 AM 03/10/2023    4:15 PM 03/10/2023    3:52 AM  BMP  Glucose 70 - 99 mg/dL 91  914  782   BUN 8 - 23 mg/dL 27  30  33   Creatinine 0.44 - 1.00 mg/dL 9.56  2.13  0.86   Sodium 135 - 145 mmol/L 133  131  134   Potassium 3.5 - 5.1 mmol/L 4.0  4.0  4.3   Chloride 98 - 111 mmol/L 100  97  100   CO2 22 - 32 mmol/L 23  22  23    Calcium 8.9 - 10.3 mg/dL 8.0  7.9  8.2     ABG    Component Value Date/Time   PHART 7.394 03/09/2023 1901   PCO2ART 36.2 03/09/2023 1901   PO2ART 166 (H) 03/09/2023 1901   HCO3 22.2 03/09/2023 1901   TCO2 23 03/09/2023 1901   ACIDBASEDEF 2.0 03/09/2023 1901   O2SAT 100 03/09/2023 1901       Brynda Greathouse,  MD 03/11/2023 7:22 PM

## 2023-03-11 NOTE — Progress Notes (Signed)
2 Days Post-Op Procedure(s) (LRB): CORONARY ARTERY BYPASS GRAFTING (CABG) TIMES THREE USING THE LEFT INTERNAL MAMMARY ARTERY (LIMA) AND ENDOSCOPICALLY HARVESTED RIGHT GREATER SAPHENOUS VEIN (N/A) TRANSESOPHAGEAL ECHOCARDIOGRAM (N/A) Subjective:  No specific complaints. Ambulated this am but says she did not do as well.  Objective: Vital signs in last 24 hours: Temp:  [97.7 F (36.5 C)-98.4 F (36.9 C)] 98.3 F (36.8 C) (09/25 0756) Pulse Rate:  [80-95] 83 (09/25 0700) Cardiac Rhythm: A-V Sequential paced (09/25 0330) Resp:  [10-25] 19 (09/25 0700) BP: (98-141)/(50-97) 99/57 (09/25 0700) SpO2:  [93 %-100 %] 100 % (09/25 0700) Arterial Line BP: (78-147)/(42-65) 109/47 (09/25 0700) Weight:  [79.7 kg] 79.7 kg (09/25 0600)  Hemodynamic parameters for last 24 hours:    Intake/Output from previous day: 09/24 0701 - 09/25 0700 In: 1760.1 [P.O.:480; I.V.:988.7; IV Piggyback:291.4] Out: 1845 [Urine:1825; Chest Tube:20] Intake/Output this shift: No intake/output data recorded.  General appearance: alert and cooperative Neurologic: intact Heart: regular rate and rhythm Lungs: diminished breath sounds bibasilar Extremities: edema mild Wound: incision ok  Lab Results: Recent Labs    03/10/23 1615 03/11/23 0322  WBC 9.1 8.5  HGB 8.4* 8.2*  HCT 27.0* 26.7*  PLT 130* 140*   BMET:  Recent Labs    03/10/23 1615 03/11/23 0322  NA 131* 133*  K 4.0 4.0  CL 97* 100  CO2 22 23  GLUCOSE 168* 91  BUN 30* 27*  CREATININE 1.38* 1.24*  CALCIUM 7.9* 8.0*    PT/INR:  Recent Labs    03/09/23 1314  LABPROT 17.4*  INR 1.4*   ABG    Component Value Date/Time   PHART 7.394 03/09/2023 1901   HCO3 22.2 03/09/2023 1901   TCO2 23 03/09/2023 1901   ACIDBASEDEF 2.0 03/09/2023 1901   O2SAT 100 03/09/2023 1901   CBG (last 3)  Recent Labs    03/10/23 2347 03/11/23 0323 03/11/23 0753  GLUCAP 107* 84 133*   CXR: bibasilar atelectasis  Assessment/Plan: S/P Procedure(s)  (LRB): CORONARY ARTERY BYPASS GRAFTING (CABG) TIMES THREE USING THE LEFT INTERNAL MAMMARY ARTERY (LIMA) AND ENDOSCOPICALLY HARVESTED RIGHT GREATER SAPHENOUS VEIN (N/A) TRANSESOPHAGEAL ECHOCARDIOGRAM (N/A)  POD 2 Remains on NE 2. Wean off today. Rhythm is internally V-paced at 60 but BP drops when external pacer turned off. Will increase internal rate to 80 for now.  Needs diuresis since wt is 9 lbs over preop. Start lasix and KCL.  IS, OOB, PT consult to help with ambulation.  DC arterial line.  Plan to resume Eliquis after internal pacer wires out.   LOS: 7 days    [name] 03/11/2023

## 2023-03-12 DIAGNOSIS — I272 Pulmonary hypertension, unspecified: Secondary | ICD-10-CM | POA: Diagnosis not present

## 2023-03-12 DIAGNOSIS — I5043 Acute on chronic combined systolic (congestive) and diastolic (congestive) heart failure: Secondary | ICD-10-CM | POA: Diagnosis not present

## 2023-03-12 DIAGNOSIS — I4821 Permanent atrial fibrillation: Secondary | ICD-10-CM | POA: Diagnosis not present

## 2023-03-12 LAB — BASIC METABOLIC PANEL
Anion gap: 6 (ref 5–15)
BUN: 27 mg/dL — ABNORMAL HIGH (ref 8–23)
CO2: 29 mmol/L (ref 22–32)
Calcium: 8.2 mg/dL — ABNORMAL LOW (ref 8.9–10.3)
Chloride: 98 mmol/L (ref 98–111)
Creatinine, Ser: 1.37 mg/dL — ABNORMAL HIGH (ref 0.44–1.00)
GFR, Estimated: 43 mL/min — ABNORMAL LOW (ref >60)
Glucose, Bld: 89 mg/dL (ref 70–99)
Potassium: 4.7 mmol/L (ref 3.5–5.1)
Sodium: 133 mmol/L — ABNORMAL LOW (ref 135–145)

## 2023-03-12 LAB — GLUCOSE, CAPILLARY
Glucose-Capillary: 95 mg/dL (ref 70–99)
Glucose-Capillary: 225 mg/dL — ABNORMAL HIGH (ref 70–99)
Glucose-Capillary: 195 mg/dL — ABNORMAL HIGH (ref 70–99)
Glucose-Capillary: 194 mg/dL — ABNORMAL HIGH (ref 70–99)

## 2023-03-12 MED ORDER — SODIUM CHLORIDE 0.9% FLUSH
3.0000 mL | Freq: Two times a day (BID) | INTRAVENOUS | Status: DC
  Administered 2023-03-12 – 2023-03-17 (×10): 3 mL via INTRAVENOUS

## 2023-03-12 MED ORDER — ~~LOC~~ CARDIAC SURGERY, PATIENT & FAMILY EDUCATION: Freq: Once | Status: AC

## 2023-03-12 MED ORDER — SODIUM CHLORIDE 0.9% FLUSH: 3.0000 mL | INTRAVENOUS | Status: DC | PRN

## 2023-03-12 MED ORDER — SODIUM CHLORIDE 0.9 % IV SOLN: 250.0000 mL | INTRAVENOUS | Status: DC | PRN

## 2023-03-12 MED ORDER — ASPIRIN 325 MG PO TBEC
325.0000 mg | DELAYED_RELEASE_TABLET | Freq: Every day | ORAL | Status: DC
  Administered 2023-03-13 – 2023-03-17 (×5): 325 mg via ORAL
  Filled 2023-03-12 (×5): qty 1

## 2023-03-12 MED ORDER — LACTULOSE 10 GM/15ML PO SOLN
20.0000 g | Freq: Once | ORAL | Status: AC
  Administered 2023-03-12: 20 g via ORAL
  Filled 2023-03-12: qty 30

## 2023-03-12 MED ORDER — OXYCODONE HCL 5 MG PO TABS
5.0000 mg | ORAL_TABLET | ORAL | Status: DC | PRN
  Administered 2023-03-12 – 2023-03-17 (×3): 5 mg via ORAL
  Filled 2023-03-12 (×4): qty 1

## 2023-03-12 NOTE — Progress Notes (Signed)
    Advanced Heart Failure Rounding Note  PCP-Cardiologist: Chrystie Nose, MD   Subjective:     Off vasopressors, now on midodrine. Creatinine and BUN stable from yesterday.    Objective:   Weight Range: 79.7 kg Body mass index is 31.13 kg/m.   Vital Signs:   Temp:  [97.6 F (36.4 C)-98.3 F (36.8 C)] 97.6 F (36.4 C) (09/26 0733) Pulse Rate:  [79-89] 81 (09/26 0800) Resp:  [10-25] 10 (09/26 0815) BP: (78-131)/(36-94) 96/58 (09/26 0815) SpO2:  [86 %-100 %] 100 % (09/26 0800) Arterial Line BP: (90-130)/(46-59) 114/53 (09/25 1202) Weight:  [79.7 kg] 79.7 kg (09/26 0500) Last BM Date : 03/07/23  Weight change: Filed Weights   03/10/23 0500 03/11/23 0600 03/12/23 0500  Weight: 76.3 kg 79.7 kg 79.7 kg    Intake/Output:   Intake/Output Summary (Last 24 hours) at 03/12/2023 0914 Last data filed at 03/12/2023 0800 Gross per 24 hour  Intake 861.64 ml  Output 1800 ml  Net -938.36 ml      Physical Exam    General:  Chronically ill appearing HEENT: Normal Cor: RRR, no m/g/r, mildly elevated JVP, trace LE edea Lungs: Diminished breath sounds Abdomen: Soft, nontender, nondistended. No hepatosplenomegaly. No bruits or masses. Good bowel sounds. Extremities: No cyanosis, clubbing, rash, edema Neuro: Alert & orientedx3, cranial nerves grossly intact. moves all 4 extremities w/o difficulty. Affect pleasant   Telemetry   Paced rhythm in the 80s  Patient Profile   Emily Farmer is a 67 year old with a history of DMII, HLD, CKD Stage IIIa, permanent A Fib, 01/02/2023 AV node ablation and single chamber PPM who presented with chest pain and found to have multivessel disease with significantly elevated PA pressures. She is s/p 3v CABG on 9/23.    Assessment/Plan   Acute on chronic diastolic heart failure with prominent RV dysfunction: RHC on 9/17 showed mean RA 20, PA 82/28 mean 50, mean PCWP 36 with v-waves to 50, CI 2.57, PVR 2.9 WU => elevated right and left heart  filling pressures with severe primarily pulmonary venous hypertension. Improved with diuresis prior to surgery. Creatinine relatively stable this morning, would recommend continuing diuresis today.  - 40mg  IV lasix x1 would be reasonable - Hold off on SGLT2 inhibitor with UTI.  - Neo off 9/23.  Norepi off 9/25, midodrine 10mg  TID - SCr back to baseline.  CAD: S/p 3v CABG(03/09/23) SVG to the LAD, SVG to PL, SVG to PDA: - Continue statin - Holding metoprolol until off pressors  Pulmonary hypertension: Severe pulmonary venous hypertension with PVR only 2.9 WU on 9/17 cath. Repeat RHC with mild pulmonary hypertension (significant improvement with diuresis), PVR now 3.3. OSA may contribute.  - CPAP - Diuresis as above  OSA - Continue CPAP  Atrial fibrillation: Permanent. S/p AV nodal ablation with PPM (has left bundle lead). Paced QRS is narrow due to LB lead.  - Increased device settings to 80bpm  AKI on CKD stage 3: Cardiorenal, improved with diuresis.  Patient to transfer out of the ICU, given her improvement in pulmonary pressures and off vasopressors can be followed by general cardiology.   Length of Stay: 8  Emily Minus, MD  03/12/2023, 9:14 AM  Advanced Heart Failure Team Pager [phone] (M-F; 7a - 5p)  Please contact [facility] for night-coverage after hours (5p -7a ) and weekends on [email or web address]

## 2023-03-12 NOTE — Progress Notes (Signed)
3 Days Post-Op Procedure(s) (LRB): CORONARY ARTERY BYPASS GRAFTING (CABG) TIMES THREE USING THE LEFT INTERNAL MAMMARY ARTERY (LIMA) AND ENDOSCOPICALLY HARVESTED RIGHT GREATER SAPHENOUS VEIN (N/A) TRANSESOPHAGEAL ECHOCARDIOGRAM (N/A) Subjective: Some dizziness when sitting on bedside commode.   BP has been stable off NE. On midodrine 10 tid.  Rhythm is internally paced at 80 VVI.  Objective: Vital signs in last 24 hours: Temp:  [98 F (36.7 C)-98.3 F (36.8 C)] 98 F (36.7 C) (09/25 2015) Pulse Rate:  [79-89] 80 (09/26 0600) Cardiac Rhythm: Ventricular paced (09/25 2000) Resp:  [10-25] 17 (09/26 0600) BP: (78-131)/(36-94) 114/64 (09/26 0600) SpO2:  [86 %-100 %] 100 % (09/26 0600) Arterial Line BP: (83-138)/(42-60) 114/53 (09/25 1202) Weight:  [79.7 kg] 79.7 kg (09/26 0500)  Hemodynamic parameters for last 24 hours:    Intake/Output from previous day: 09/25 0701 - 09/26 0700 In: 973.1 [P.O.:360; I.V.:613.1] Out: 1800 [Urine:1800] Intake/Output this shift: No intake/output data recorded.  General appearance: alert and cooperative Neurologic: intact Heart: regular rate and rhythm Lungs: clear to auscultation bilaterally Extremities: edema minimal Wound: incision healing well.  Lab Results: Recent Labs    03/10/23 1615 03/11/23 0322  WBC 9.1 8.5  HGB 8.4* 8.2*  HCT 27.0* 26.7*  PLT 130* 140*   BMET:  Recent Labs    03/11/23 0322 03/12/23 0441  NA 133* 133*  K 4.0 4.7  CL 100 98  CO2 23 29  GLUCOSE 91 89  BUN 27* 27*  CREATININE 1.24* 1.37*  CALCIUM 8.0* 8.2*    PT/INR:  Recent Labs    03/09/23 1314  LABPROT 17.4*  INR 1.4*   ABG    Component Value Date/Time   PHART 7.394 03/09/2023 1901   HCO3 22.2 03/09/2023 1901   TCO2 23 03/09/2023 1901   ACIDBASEDEF 2.0 03/09/2023 1901   O2SAT 100 03/09/2023 1901   CBG (last 3)  Recent Labs    03/11/23 1543 03/11/23 1936 03/12/23 0555  GLUCAP 165* 157* 95    Assessment/Plan: S/P Procedure(s)  (LRB): CORONARY ARTERY BYPASS GRAFTING (CABG) TIMES THREE USING THE LEFT INTERNAL MAMMARY ARTERY (LIMA) AND ENDOSCOPICALLY HARVESTED RIGHT GREATER SAPHENOUS VEIN (N/A) TRANSESOPHAGEAL ECHOCARDIOGRAM (N/A)  POD 3  Hemodynamically stable. Internally paced at 80. 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 She was on some cardizem preop but will hold off on that. No beta blocker. Continue midodrine and wean as indicated.  Wt is about 8 lbs over preop but not sure if accurate. Will see what she is tomorrow on 4E. BUN and creat bumped slightly so hold on further diuresis today and follow up BMET tomorrow.  DC external pacing wires and sleeve this am. DC foley.  Transfer to 4E and continue IS, ambulation.  Laxative.     LOS: 8 days    [name] 03/12/2023

## 2023-03-12 NOTE — Evaluation (Signed)
Physical Therapy Evaluation Patient Details Name: Emily Farmer MRN: [id number] DOB: [birth year] Today's Date: 03/12/2023  History of Present Illness  Pt is a 67 y/o female admitted 9/15 with angina/CP and dyspnea.  Heart cath showed multivessel disease, Pt s/p CABG x4 on 9/23.  PMHx:  afib, chronic dCHF, DM, HTN, HLD, pacer.  Clinical Impression  Pt admitted with/for CP leading to CABG x4.  Pt is not at baseline function, though did well needing min assist overall with RW for gait. Marland Kitchen  Pt currently limited functionally due to the problems listed. ( See problems list.)   Pt will benefit from PT to maximize function and safety in order to get ready for next venue listed below.         If plan is discharge home, recommend the following: A little help with walking and/or transfers;A little help with bathing/dressing/bathroom;Two people to help with bathing/dressing/bathroom;Assist for transportation   Can travel by private vehicle   Yes    Equipment Recommendations Rolling walker (2 wheels);BSC/3in1  Recommendations for Other Services       Functional Status Assessment Patient has had a recent decline in their functional status and demonstrates the ability to make significant improvements in function in a reasonable and predictable amount of time.     Precautions / Restrictions Precautions Precautions: Sternal;Fall Restrictions Other Position/Activity Restrictions: sternal precautions      Mobility  Bed Mobility Overal bed mobility: Needs Assistance Bed Mobility: Supine to Sit, Sit to Sidelying     Supine to sit: Mod assist, HOB elevated   Sit to sidelying: Mod assist General bed mobility comments: cued for sternal precautions, assisted trunk up and bil LE down to sidelying.    Transfers Overall transfer level: Needs assistance Equipment used: Rolling walker (2 wheels) Transfers: Sit to/from Stand Sit to Stand: Min assist           General transfer comment: cues  for sternal precautions/technique and min assist more forward than boost.    Ambulation/Gait Ambulation/Gait assistance: Min assist Gait Distance (Feet): 65 Feet Assistive device: Rolling walker (2 wheels) Gait Pattern/deviations: Step-through pattern   Gait velocity interpretation: <1.31 ft/sec, indicative of household ambulator   General Gait Details: short steady steps, slow gait, good use of the RW.  Pt started to panic and wheeze at about 65 feet necessitation sitting to recover.  HR in the upper 80's, PLETH was poor and not reading.  Pt's features were "rosy" suggesting good Sats and after sitting with good PLETH, sats topped on at 93% on RA.  O2 returned at end of session.  Stairs            Wheelchair Mobility     Tilt Bed    Modified Rankin (Stroke Patients Only)       Balance Overall balance assessment: Needs assistance Sitting-balance support: No upper extremity supported, Feet supported, Feet unsupported Sitting balance-Leahy Scale: Good     Standing balance support: Bilateral upper extremity supported, No upper extremity supported, During functional activity Standing balance-Leahy Scale: Fair Standing balance comment: sanding at EOB without AD or external support while getting "Sugars" and temp.                             Pertinent Vitals/Pain Pain Assessment Pain Assessment: Faces Faces Pain Scale: Hurts a little bit Pain Location: general, bed mobility, sternal Pain Descriptors / Indicators: Discomfort, Grimacing, Sore Pain Intervention(s): Limited activity within patient's tolerance, Monitored during  session    Home Living Family/patient expects to be discharged to:: Private residence Living Arrangements:  (lives with sister.) Available Help at Discharge: Family;Available 24 hours/day Type of Home: Apartment Home Access: Level entry       Home Layout: One level Home Equipment: Cane - single point      Prior Function Prior  Level of Function : Independent/Modified Independent             Mobility Comments: Independent, used public transportation for errands/groceries ADLs Comments: Independent with ADL's     Extremity/Trunk Assessment   Upper Extremity Assessment Upper Extremity Assessment: Overall WFL for tasks assessed (limited testing due to sternal precautions)    Lower Extremity Assessment Lower Extremity Assessment: Generalized weakness;Overall [facility] for tasks assessed    Cervical / Trunk Assessment Cervical / Trunk Assessment: Kyphotic  Communication   Communication Communication: No apparent difficulties  Cognition Arousal: Alert Behavior During Therapy: WFL for tasks assessed/performed Overall Cognitive Status: Within Functional Limits for tasks assessed                                          General Comments General comments (skin integrity, edema, etc.): Good discussion of sternal precautions, pt return verbalized and demo'd, but continues to need repetitive cuing.    Exercises     Assessment/Plan    PT Assessment Patient needs continued PT services  PT Problem List Decreased strength;Decreased activity tolerance;Decreased mobility;Decreased knowledge of use of DME;Cardiopulmonary status limiting activity;Decreased knowledge of precautions;Pain       PT Treatment Interventions DME instruction;Gait training;Functional mobility training;Therapeutic activities;Patient/family education    PT Goals (Current goals can be found in the Care Plan section)  Acute Rehab PT Goals Patient Stated Goal: home when able PT Goal Formulation: With patient Time For Goal Achievement: 03/26/23 Potential to Achieve Goals: Good    Frequency Min 1X/week     Co-evaluation               AM-PAC PT "6 Clicks" Mobility  Outcome Measure Help needed turning from your back to your side while in a flat bed without using bedrails?: A Little Help needed moving from lying on  your back to sitting on the side of a flat bed without using bedrails?: A Little Help needed moving to and from a bed to a chair (including a wheelchair)?: A Little Help needed standing up from a chair using your arms (e.g., wheelchair or bedside chair)?: A Little Help needed to walk in hospital room?: A Little Help needed climbing 3-5 steps with a railing? : A Little 6 Click Score: 18    End of Session   Activity Tolerance: Patient tolerated treatment well;Patient limited by fatigue Patient left: in bed;with call bell/phone within reach;with nursing/sitter in room Nurse Communication: Mobility status PT Visit Diagnosis: Unsteadiness on feet (R26.81);Other abnormalities of gait and mobility (R26.89);Pain;Difficulty in walking, not elsewhere classified (R26.2) Pain - part of body:  (sternal)    Time: 0272-5366 PT Time Calculation (min) (ACUTE ONLY): 36 min   Charges:   PT Evaluation $PT Eval Moderate Complexity: 1 Mod PT Treatments $Gait Training: 8-22 mins PT General Charges $$ ACUTE PT VISIT: 1 Visit         03/12/2023  Jacinto Halim., PT Acute Rehabilitation Services [phone]  (office)  Eliseo Gum Dashanique Brownstein 03/12/2023, 12:02 PM

## 2023-03-13 DIAGNOSIS — I200 Unstable angina: Secondary | ICD-10-CM | POA: Diagnosis not present

## 2023-03-13 LAB — BASIC METABOLIC PANEL
Anion gap: 7 (ref 5–15)
BUN: 39 mg/dL — ABNORMAL HIGH (ref 8–23)
CO2: 28 mmol/L (ref 22–32)
Calcium: 8.5 mg/dL — ABNORMAL LOW (ref 8.9–10.3)
Chloride: 97 mmol/L — ABNORMAL LOW (ref 98–111)
Creatinine, Ser: 1.52 mg/dL — ABNORMAL HIGH (ref 0.44–1.00)
GFR, Estimated: 38 mL/min — ABNORMAL LOW (ref >60)
Glucose, Bld: 154 mg/dL — ABNORMAL HIGH (ref 70–99)
Potassium: 4.4 mmol/L (ref 3.5–5.1)
Sodium: 132 mmol/L — ABNORMAL LOW (ref 135–145)

## 2023-03-13 LAB — GLUCOSE, CAPILLARY
Glucose-Capillary: 183 mg/dL — ABNORMAL HIGH (ref 70–99)
Glucose-Capillary: 279 mg/dL — ABNORMAL HIGH (ref 70–99)
Glucose-Capillary: 169 mg/dL — ABNORMAL HIGH (ref 70–99)
Glucose-Capillary: 156 mg/dL — ABNORMAL HIGH (ref 70–99)

## 2023-03-13 MED ORDER — INSULIN ASPART 100 UNIT/ML IJ SOLN
6.0000 [IU] | Freq: Three times a day (TID) | INTRAMUSCULAR | Status: DC
  Administered 2023-03-14 – 2023-03-18 (×5): 6 [IU] via SUBCUTANEOUS

## 2023-03-13 MED ORDER — ACETAMINOPHEN 500 MG PO TABS
1000.0000 mg | ORAL_TABLET | Freq: Four times a day (QID) | ORAL | Status: DC | PRN
  Administered 2023-03-13 – 2023-03-17 (×9): 1000 mg via ORAL
  Filled 2023-03-13 (×9): qty 2

## 2023-03-13 MED ORDER — TORSEMIDE 20 MG PO TABS
40.0000 mg | ORAL_TABLET | Freq: Every day | ORAL | Status: AC
  Administered 2023-03-14: 40 mg via ORAL
  Filled 2023-03-13 (×2): qty 2

## 2023-03-13 MED ORDER — INSULIN DETEMIR 100 UNIT/ML ~~LOC~~ SOLN
30.0000 [IU] | Freq: Every day | SUBCUTANEOUS | Status: DC
  Administered 2023-03-13 – 2023-03-16 (×4): 30 [IU] via SUBCUTANEOUS
  Filled 2023-03-13 (×5): qty 0.3

## 2023-03-13 MED ORDER — LOPERAMIDE HCL 2 MG PO CAPS
2.0000 mg | ORAL_CAPSULE | ORAL | Status: DC | PRN
  Administered 2023-03-13 – 2023-03-15 (×2): 2 mg via ORAL
  Filled 2023-03-13 (×2): qty 1

## 2023-03-13 NOTE — Progress Notes (Signed)
CARDIAC REHAB PHASE I   PRE:  Rate/Rhythm:80 pacing    BP: sitting 133/66    SpO2: 96 1L  MODE:  Ambulation: 190 ft   POST:  Rate/Rhythm: 92 pacing    BP: sitting 142/69    SpO2:  94 1L  Pt found sitting on EOB. Stood with rocking. Contact guard gait belt assist with rollator, 1L. Pt slow and steady, felt well. Rest x1 for mild dizziness. To recliner with feet elevated in room. SpO2 maintained 94 1L, no hypotension. Encouraged IS.  [id number]  Ethelda Chick BS, ACSM-CEP 03/13/2023 1:58 PM

## 2023-03-13 NOTE — [place] FL2 (Signed)
[place] MEDICAID FL2 LEVEL OF CARE FORM     IDENTIFICATION  Patient Name: Emily Farmer Birthdate: [birth year] Sex: female Admission Date (Current Location): 03/01/2023  [facility] and [place] Number:  Producer, television/film/video and Address:  [facility] [place], [address] [place], [place] [address]      Provider Number: [id number]  Attending Physician Name and Address:  [name], MD  Relative Name and Phone Number:  Jaidalyn, [place] [phone]    Current Level of Care: SNF Recommended Level of Care: Skilled Nursing Facility Prior Approval Number:    Date Approved/Denied:   PASRR Number: [id number]  Discharge Plan: SNF    Current Diagnoses: Patient Active Problem List   Diagnosis Date Noted   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/2023   Coronary artery disease 03/09/2023   Coronary artery disease involving native coronary artery of native heart with unstable angina pectoris (HCC) 03/05/2023   Accelerating angina (HCC) 03/01/2023   Stage 3b chronic kidney disease (HCC) 11/18/2022   Proliferative diabetic retinopathy of both eyes (HCC) 10/29/2022   Peripheral arterial disease (HCC) 10/14/2022   Polyneuropathy associated with underlying disease (HCC) 08/22/2022   Type 2 diabetes mellitus with peripheral neuropathy (HCC) 08/22/2022   Pacemaker 06/03/2022   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   Bacteremia 04/01/2021   Atrial fibrillation with slow ventricular response (HCC) 03/31/2021   Pulmonary hypertension (HCC) 03/19/2021   Hypomagnesemia    Sepsis (HCC) 02/27/2021   Atrial fibrillation with rapid ventricular response (HCC) 02/27/2021   Hypotension 02/27/2021   Secondary hypercoagulable state (HCC) 05/08/2020   Dyslipidemia 09/23/2019   Toenail fungus 07/19/2018   Depression 07/19/2018   Iron deficiency anemia due to chronic blood loss 07/19/2018   Gastritis and duodenitis 07/19/2018   Chronic a-fib (HCC) 01/28/2018   Hyponatremia  01/28/2018   Chronic diastolic CHF (congestive heart failure) (HCC) 01/28/2018   Mixed hyperlipidemia 06/01/2017   Paroxysmal A-fib (HCC) 10/04/2016   Hypokalemia 01/04/2014   Acute pyelonephritis 02/15/2013   Essential hypertension 02/15/2013   Hypertriglyceridemia 02/15/2013   Coronary atherosclerosis seen on CT 02/15/2013    Orientation RESPIRATION BLADDER Height & Weight     Self, Time, Situation, Place  O2 Continent Weight: 179 lb 0.2 oz (81.2 kg) Height:  5\' 3"  (160 cm)  BEHAVIORAL SYMPTOMS/MOOD NEUROLOGICAL BOWEL NUTRITION STATUS      Continent Diet (see dc summary)  AMBULATORY STATUS COMMUNICATION OF NEEDS Skin   Limited Assist Verbally Normal                       Personal Care Assistance Level of Assistance  Bathing, Feeding, Dressing Bathing Assistance: Limited assistance Feeding assistance: Limited assistance Dressing Assistance: Limited assistance     Functional Limitations Info             SPECIAL CARE FACTORS FREQUENCY  PT (By licensed PT), OT (By licensed OT)     PT Frequency: 5x weekly OT Frequency: 5x weekly            Contractures      Additional Factors Info  Code Status, Allergies Code Status Info: FULL Allergies Info: Trulicity (Dulaglutide)           Current Medications (03/13/2023):  This is the current hospital active medication list Current Facility-Administered Medications  Medication Dose Route Frequency Provider Last Rate Last Admin   0.9 %  sodium chloride infusion  250 mL Intravenous PRN [name], MD  acetaminophen (TYLENOL) tablet 1,000 mg  1,000 mg Oral Q6H PRN Gold, Wayne E, PA-C   1,000 mg at 03/13/23 1204   aspirin EC tablet 325 mg  325 mg Oral Daily [name], MD   325 mg at 03/13/23 0827   atorvastatin (LIPITOR) tablet 40 mg  40 mg Oral Daily [name], MD   40 mg at 03/13/23 0827   fenofibrate tablet 160 mg  160 mg Oral Daily [name], MD   160 mg at 03/13/23 0827   ferrous  sulfate tablet 325 mg  325 mg Oral Q breakfast [name], MD   325 mg at 03/13/23 0827   insulin aspart (novoLOG) injection 0-24 Units  0-24 Units Subcutaneous TID WC [name], MD   4 Units at 03/13/23 8657   insulin aspart (novoLOG) injection 6 Units  6 Units Subcutaneous TID WC [name], MD       insulin detemir (LEVEMIR) injection 30 Units  30 Units Subcutaneous Daily [name], MD   30 Units at 03/13/23 0948   loperamide (IMODIUM) capsule 2 mg  2 mg Oral PRN Gershon Crane E, PA-C   2 mg at 03/13/23 1204   midodrine (PROAMATINE) tablet 10 mg  10 mg Oral TID WC [name], MD   10 mg at 03/13/23 1203   ondansetron (ZOFRAN) injection 4 mg  4 mg Intravenous Q6H PRN [name], MD   4 mg at 03/12/23 0757   Oral care mouth rinse  15 mL Mouth Rinse PRN [name], MD       oxyCODONE (Oxy IR/ROXICODONE) immediate release tablet 5 mg  5 mg Oral Q3H PRN [name], MD   5 mg at 03/12/23 2118   pantoprazole (PROTONIX) EC tablet 40 mg  40 mg Oral Daily [name], MD   40 mg at 03/13/23 8469   sodium chloride flush (NS) 0.9 % injection 3 mL  3 mL Intravenous Q12H [name], MD   3 mL at 03/12/23 2108   sodium chloride flush (NS) 0.9 % injection 3 mL  3 mL Intravenous PRN [name], MD       torsemide (DEMADEX) tablet 40 mg  40 mg Oral Daily [name], MD       traMADol Janean Sark) tablet 50 mg  50 mg Oral Q4H PRN [name], MD   50 mg at 03/12/23 1708     Discharge Medications: Please see discharge summary for a list of discharge medications.  Relevant Imaging Results:  Relevant Lab Results:   Additional Information SS: [id number]  Reva Bores, LCSWA

## 2023-03-13 NOTE — TOC Progression Note (Addendum)
Transition of Care [facility]) - Progression Note    Patient Details  Name: Emily Farmer MRN: [id number] Date of Birth: 07/14/1955  [facility]) CM/SW Contact  Nicanor Bake Phone Number: [phone]  03/13/2023, 12:13 PM  Clinical Narrative:  [facility] CSW  met with pt at bedside. Pt stated that she is interested in SNF closest to her home.   [facility] CSW met with pt at a later time to discuss SNF approval list. Pt selected [facility]. Pts FL2 completed and auth currently pending. Auth id  [id number].   TOC will continue following.     Expected Discharge Plan: IP Rehab Facility Barriers to Discharge: Continued Medical Work up  Expected Discharge Plan and Services   Discharge Planning Services: CM Consult Post Acute Care Choice: IP Rehab Living arrangements for the past 2 months: Apartment                                       Social Determinants of Health (SDOH) Interventions SDOH Screenings   Food Insecurity: No Food Insecurity (03/02/2023)  Housing: Low Risk  (03/02/2023)  Transportation Needs: No Transportation Needs (03/02/2023)  Utilities: Not At Risk (03/02/2023)  Alcohol Screen: Low Risk  (09/15/2022)  Depression (PHQ2-9): Low Risk  (11/18/2022)  Financial Resource Strain: Medium Risk (09/15/2022)  Physical Activity: Insufficiently Active (09/15/2022)  Social Connections: Socially Isolated (09/15/2022)  Stress: Stress Concern Present (07/21/2022)  Tobacco Use: Low Risk  (03/01/2023)    Readmission Risk Interventions    04/03/2021    2:32 PM  Readmission Risk Prevention Plan  Transportation Screening Complete  PCP or Specialist Appt within 5-7 Days Complete  Home Care Screening Complete  Medication Review (RN CM) Complete

## 2023-03-13 NOTE — Progress Notes (Signed)
Hold torsemide po due to low bp  92/53. PA on call notified and aware.   Lawson Radar, RN

## 2023-03-13 NOTE — Progress Notes (Signed)
   Patient Name: Emily Farmer Date of Encounter: 03/13/2023 [facility] Cardiologist: Chrystie Nose, MD   Interval Summary  .    She feels well, but continues to develop significant hypotension, especially with orthostasis.  Denies dyspnea No arrhythmia.  Vital Signs .    Vitals:   03/13/23 0041 03/13/23 0500 03/13/23 0522 03/13/23 0746  BP: 107/63  (!) 106/56 94/71  Pulse: 80  80 82  Resp: 15  15 15   Temp: 97.7 F (36.5 C)  97.8 F (36.6 C) 98.2 F (36.8 C)  TempSrc: Oral  Oral Oral  SpO2: 100%  98% 99%  Weight:  81.2 kg    Height:        Intake/Output Summary (Last 24 hours) at 03/13/2023 0901 Last data filed at 03/13/2023 0800 Gross per 24 hour  Intake 363 ml  Output --  Net 363 ml      03/13/2023    5:00 AM 03/12/2023    5:00 AM 03/11/2023    6:00 AM  Last 3 Weights  Weight (lbs) 179 lb 0.2 oz 175 lb 11.3 oz 175 lb 11.3 oz  Weight (kg) 81.2 kg 79.7 kg 79.7 kg      Telemetry/ECG    100% ventricular paced rhythm at 80 bpm, background atrial fibrillation - Personally Reviewed  Physical Exam .   GEN: No acute distress.  Sternotomy scar is healing very well.  Healthy pacemaker site. Neck: No JVD Cardiac: RRR, no murmurs, rubs, or gallops.  Respiratory: Clear to auscultation bilaterally. GI: Soft, nontender, non-distended  MS: No edema  Assessment & Plan .     Recovering slowly postop, primarily held back by orthostatic hypotension.  Remains hypervolemic, but without overt heart failure. Pacemaker programmed at 80 bpm to help with low blood pressure.  Unfortunately, heart rate will not adjust to changes in position, since she is pacemaker dependent.  For questions or updates, please contact [facility] Please consult [email or web address] for contact info under        Signed, Thurmon Fair, MD

## 2023-03-13 NOTE — Progress Notes (Signed)
4 Days Post-Op Procedure(s) (LRB): CORONARY ARTERY BYPASS GRAFTING (CABG) TIMES THREE USING THE LEFT INTERNAL MAMMARY ARTERY (LIMA) AND ENDOSCOPICALLY HARVESTED RIGHT GREATER SAPHENOUS VEIN (N/A) TRANSESOPHAGEAL ECHOCARDIOGRAM (N/A) Subjective: No complaints.  Objective: Vital signs in last 24 hours: Temp:  [97.7 F (36.5 C)-98.1 F (36.7 C)] 97.8 F (36.6 C) (09/27 0522) Pulse Rate:  [79-84] 80 (09/27 0522) Cardiac Rhythm: Ventricular paced;Bundle branch block (09/26 1923) Resp:  [9-24] 15 (09/27 0522) BP: (96-131)/(52-81) 106/56 (09/27 0522) SpO2:  [90 %-100 %] 98 % (09/27 0522) Weight:  [81.2 kg] 81.2 kg (09/27 0500)  Hemodynamic parameters for last 24 hours:    Intake/Output from previous day: 09/26 0701 - 09/27 0700 In: 131.5 [P.O.:120; I.V.:11.5] Out: -  Intake/Output this shift: No intake/output data recorded.  General appearance: alert and cooperative Neurologic: intact Heart: regular rate and rhythm, S1, S2 normal, no murmur Lungs: clear to auscultation bilaterally Abdomen: soft, non-tender; bowel sounds normal Extremities: edema mild  Wound: incisions ok  Lab Results: Recent Labs    03/10/23 1615 03/11/23 0322  WBC 9.1 8.5  HGB 8.4* 8.2*  HCT 27.0* 26.7*  PLT 130* 140*   BMET:  Recent Labs    03/11/23 0322 03/12/23 0441  NA 133* 133*  K 4.0 4.7  CL 100 98  CO2 23 29  GLUCOSE 91 89  BUN 27* 27*  CREATININE 1.24* 1.37*  CALCIUM 8.0* 8.2*    PT/INR: No results for input(s): "LABPROT", "INR" in the last 72 hours. ABG    Component Value Date/Time   PHART 7.394 03/09/2023 1901   HCO3 22.2 03/09/2023 1901   TCO2 23 03/09/2023 1901   ACIDBASEDEF 2.0 03/09/2023 1901   O2SAT 100 03/09/2023 1901   CBG (last 3)  Recent Labs    03/12/23 1612 03/12/23 2113 03/13/23 0636  GLUCAP 195* 194* 183*    Assessment/Plan: S/P Procedure(s) (LRB): CORONARY ARTERY BYPASS GRAFTING (CABG) TIMES THREE USING THE LEFT INTERNAL MAMMARY ARTERY (LIMA) AND  ENDOSCOPICALLY HARVESTED RIGHT GREATER SAPHENOUS VEIN (N/A) TRANSESOPHAGEAL ECHOCARDIOGRAM (N/A)  Hemodynamically stable on midodrine. Sinus rhythm. No beta blocker while requiring midodrine.  Wt is about 13 lbs over preop and still has some edema. Will give demedex for a couple days. She has CKD so will check creat tomorrow.  DM: glucose rising some now that she is eating. Will increase Levemir to 30 units. Add meal coverage. She was on 55 of Lantus in addition to humalog at meals and Actos and Victoza at home.  Continue IS, Ambulation.   LOS: 9 days    Emily Farmer 03/13/2023

## 2023-03-13 NOTE — Care Management Important Message (Signed)
Important Message  Patient Details  Name: Emily Farmer MRN: [id number] Date of Birth: [birth year]   Important Message Given:  Yes - Medicare IM     Sherilyn Banker 03/13/2023, 3:10 PM

## 2023-03-13 NOTE — Progress Notes (Signed)
Physical Therapy Treatment Patient Details Name: Emily Farmer MRN: [id number] DOB: Sep 16, 1955 Today's Date: 03/13/2023   History of Present Illness Pt is a 67 y/o female admitted 9/15 with angina/CP and dyspnea.  Heart cath showed multivessel disease, Pt s/p CABG x4 on 9/23.  PMHx:  afib, chronic dCHF, DM, HTN, HLD, pacer.    PT Comments  Pt received in supine and agreeable to session with encouragement. Pt reporting dizziness once sitting EOB and orthostatic BP's taken and noted below. Pt able to improve STS with increased practice. Pt able to tolerate short gait trial, however is limited by dizziness. SpO2 >90% on RA throughout ambulation, however dropped once sitting EOB requiring O2 and cues for breathing technique to recover, RN notified. Pt continues to benefit from PT services to progress toward functional mobility goals.    If plan is discharge home, recommend the following: A little help with walking and/or transfers;A little help with bathing/dressing/bathroom;Two people to help with bathing/dressing/bathroom;Assist for transportation   Can travel by private vehicle     Yes  Equipment Recommendations  Rolling walker (2 wheels);BSC/3in1    Recommendations for Other Services       Precautions / Restrictions Precautions Precautions: Sternal;Fall Restrictions Weight Bearing Restrictions: Yes Other Position/Activity Restrictions: sternal precautions     Mobility  Bed Mobility Overal bed mobility: Needs Assistance Bed Mobility: Supine to Sit, Sit to Supine     Supine to sit: Contact guard, HOB elevated Sit to supine: Min assist, HOB elevated   General bed mobility comments: Cues for technique to maintain precautions and pt hugging pillow. Min A for BLE elevation to EOB when returning to supine    Transfers Overall transfer level: Needs assistance Equipment used: Rolling walker (2 wheels) Transfers: Sit to/from Stand Sit to Stand: Min assist, Contact guard assist            General transfer comment: STS from EOB with initial min A for anterior weight shift progressing to CGA with cues    Ambulation/Gait Ambulation/Gait assistance: Contact guard assist Gait Distance (Feet): 60 Feet Assistive device: Rolling walker (2 wheels) Gait Pattern/deviations: Step-through pattern, Trunk flexed, Decreased stride length       General Gait Details: Pt demonstrating slow, short steps, but is able to slightly improve with cues. Pt limited by dizziness and reports increased DOE at end of gait trial. SpO2 >90% on RA during ambulation and dropping to 77% on RA once sitting EOB and quickly recovering with O2 and cues for breathing technique.      Balance Overall balance assessment: Needs assistance Sitting-balance support: No upper extremity supported, Feet supported Sitting balance-Leahy Scale: Good Sitting balance - Comments: sitting EOB   Standing balance support: Bilateral upper extremity supported, During functional activity Standing balance-Leahy Scale: Fair Standing balance comment: with RW support                            Cognition Arousal: Alert Behavior During Therapy: WFL for tasks assessed/performed Overall Cognitive Status: Within Functional Limits for tasks assessed                                          Exercises      General Comments General comments (skin integrity, edema, etc.): BP sitting EOB: 95/59, standing: 94/51, sitting at end of session: 92/53 with pt reporting dizziness intermittently throughout. Pt  on RA during ambulation with SpO2 >90%, however pt beginning to wheeze once sitting EOB and SpO2 noted to drop as low as 77%. 1.5L [place] was donned and pt instructed in breathing technique with SpO2 quickly recovering. Pt left on 1L O2 with SpO2 WFL.      Pertinent Vitals/Pain Pain Assessment Pain Assessment: 0-10 Pain Score: 8  Pain Location: incision Pain Descriptors / Indicators: Discomfort,  Grimacing, Sore Pain Intervention(s): Patient requesting pain meds-RN notified, Limited activity within patient's tolerance, Monitored during session     PT Goals (current goals can now be found in the care plan section) Acute Rehab PT Goals Patient Stated Goal: home when able PT Goal Formulation: With patient Time For Goal Achievement: 03/26/23 Progress towards PT goals: Progressing toward goals    Frequency    Min 1X/week       AM-PAC PT "6 Clicks" Mobility   Outcome Measure  Help needed turning from your back to your side while in a flat bed without using bedrails?: A Little Help needed moving from lying on your back to sitting on the side of a flat bed without using bedrails?: A Little Help needed moving to and from a bed to a chair (including a wheelchair)?: A Little Help needed standing up from a chair using your arms (e.g., wheelchair or bedside chair)?: A Little Help needed to walk in hospital room?: A Little Help needed climbing 3-5 steps with a railing? : A Little 6 Click Score: 18    End of Session Equipment Utilized During Treatment: Gait belt Activity Tolerance: Patient tolerated treatment well;Patient limited by fatigue;Other (comment) (dizziness) Patient left: with call bell/phone within reach (On [facility] with instruction to call nursing when finished) Nurse Communication: Mobility status;Other (comment);Patient requests pain meds (BP, O2 sats, pt on BSC) PT Visit Diagnosis: Unsteadiness on feet (R26.81);Other abnormalities of gait and mobility (R26.89);Pain;Difficulty in walking, not elsewhere classified (R26.2)     Time: 7829-5621 PT Time Calculation (min) (ACUTE ONLY): 34 min  Charges:    $Gait Training: 8-22 mins $Therapeutic Activity: 8-22 mins PT General Charges $$ ACUTE PT VISIT: 1 Visit                     Johny Shock, PTA Acute Rehabilitation Services Secure Chat Preferred  [phone]    Johny Shock 03/13/2023, 10:02  AM

## 2023-03-14 DIAGNOSIS — I200 Unstable angina: Secondary | ICD-10-CM | POA: Diagnosis not present

## 2023-03-14 LAB — CBC
HCT: 23.6 % — ABNORMAL LOW (ref 36.0–46.0)
Hemoglobin: 7.4 g/dL — ABNORMAL LOW (ref 12.0–15.0)
MCH: 28.7 pg (ref 26.0–34.0)
MCHC: 31.4 g/dL (ref 30.0–36.0)
MCV: 91.5 fL (ref 80.0–100.0)
Platelets: 220 10*3/uL (ref 150–400)
RBC: 2.58 MIL/uL — ABNORMAL LOW (ref 3.87–5.11)
RDW: 14.9 % (ref 11.5–15.5)
WBC: 6.6 10*3/uL (ref 4.0–10.5)
nRBC: 0 % (ref 0.0–0.2)

## 2023-03-14 LAB — BASIC METABOLIC PANEL
Anion gap: 7 (ref 5–15)
BUN: 43 mg/dL — ABNORMAL HIGH (ref 8–23)
CO2: 27 mmol/L (ref 22–32)
Calcium: 8.1 mg/dL — ABNORMAL LOW (ref 8.9–10.3)
Chloride: 98 mmol/L (ref 98–111)
Creatinine, Ser: 1.61 mg/dL — ABNORMAL HIGH (ref 0.44–1.00)
GFR, Estimated: 35 mL/min — ABNORMAL LOW (ref >60)
Glucose, Bld: 198 mg/dL — ABNORMAL HIGH (ref 70–99)
Potassium: 4.6 mmol/L (ref 3.5–5.1)
Sodium: 132 mmol/L — ABNORMAL LOW (ref 135–145)

## 2023-03-14 LAB — GLUCOSE, CAPILLARY
Glucose-Capillary: 175 mg/dL — ABNORMAL HIGH (ref 70–99)
Glucose-Capillary: 153 mg/dL — ABNORMAL HIGH (ref 70–99)
Glucose-Capillary: 72 mg/dL (ref 70–99)
Glucose-Capillary: 136 mg/dL — ABNORMAL HIGH (ref 70–99)

## 2023-03-14 NOTE — Progress Notes (Signed)
5 Days Post-Op Procedure(s) (LRB): CORONARY ARTERY BYPASS GRAFTING (CABG) TIMES THREE USING THE LEFT INTERNAL MAMMARY ARTERY (LIMA) AND ENDOSCOPICALLY HARVESTED RIGHT GREATER SAPHENOUS VEIN (N/A) TRANSESOPHAGEAL ECHOCARDIOGRAM (N/A) Subjective: Feels ok, just had a BM  Objective: Vital signs in last 24 hours: Temp:  [97.8 F (36.6 C)-98.2 F (36.8 C)] 97.8 F (36.6 C) (09/28 0731) Pulse Rate:  [73-84] 80 (09/28 0319) Cardiac Rhythm: Ventricular paced (09/28 0700) Resp:  [16-20] 16 (09/28 0319) BP: (92-129)/(50-71) 108/64 (09/28 0319) SpO2:  [95 %-99 %] 99 % (09/28 0319) Weight:  [80.3 kg] 80.3 kg (09/28 0500)  Hemodynamic parameters for last 24 hours:    Intake/Output from previous day: 09/27 0701 - 09/28 0700 In: 360 [P.O.:360] Out: 650 [Urine:650] Intake/Output this shift: No intake/output data recorded.  General appearance: alert, cooperative, and no distress Heart: regular rate and rhythm Lungs: mildly dim in bases Abdomen: benign Extremities: + LE edema Wound: incis healing well  Lab Results: Recent Labs    03/14/23 0314  WBC 6.6  HGB 7.4*  HCT 23.6*  PLT 220   BMET:  Recent Labs    03/13/23 1849 03/14/23 0314  NA 132* 132*  K 4.4 4.6  CL 97* 98  CO2 28 27  GLUCOSE 154* 198*  BUN 39* 43*  CREATININE 1.52* 1.61*  CALCIUM 8.5* 8.1*    PT/INR: No results for input(s): "LABPROT", "INR" in the last 72 hours. ABG    Component Value Date/Time   PHART 7.394 03/09/2023 1901   HCO3 22.2 03/09/2023 1901   TCO2 23 03/09/2023 1901   ACIDBASEDEF 2.0 03/09/2023 1901   O2SAT 100 03/09/2023 1901   CBG (last 3)  Recent Labs    03/13/23 1635 03/13/23 2103 03/14/23 0601  GLUCAP 169* 156* 175*    Meds Scheduled Meds:  aspirin EC  325 mg Oral Daily   atorvastatin  40 mg Oral Daily   fenofibrate  160 mg Oral Daily   ferrous sulfate  325 mg Oral Q breakfast   insulin aspart  0-24 Units Subcutaneous TID WC   insulin aspart  6 Units Subcutaneous TID WC    insulin detemir  30 Units Subcutaneous Daily   midodrine  10 mg Oral TID WC   pantoprazole  40 mg Oral Daily   sodium chloride flush  3 mL Intravenous Q12H   torsemide  40 mg Oral Daily   Continuous Infusions:  sodium chloride     PRN Meds:.sodium chloride, acetaminophen, loperamide, ondansetron (ZOFRAN) IV, mouth rinse, oxyCODONE, sodium chloride flush, traMADol  Xrays No results found.  Assessment/Plan: S/P Procedure(s) (LRB): CORONARY ARTERY BYPASS GRAFTING (CABG) TIMES THREE USING THE LEFT INTERNAL MAMMARY ARTERY (LIMA) AND ENDOSCOPICALLY HARVESTED RIGHT GREATER SAPHENOUS VEIN (N/A) TRANSESOPHAGEAL ECHOCARDIOGRAM (N/A) POD#5  1 afeb, s BP 90's-120's, Vpaced(PPM) 2 sats good on 1 liter 3 some increase in renal insuff, BUN/Creat-43/1.61, weight up 5 KG from preop 4 minor hyponatremia , sodium 132- on torsemide for diuretic 5 expected ABLA, lower trend H/H 7.4/23.6 , repeat in am, on Iron 6 cont rehab and pulm hygiene, likely home early in week     LOS: 10 days    Rowe Clack PA-C Pager [phone] 03/14/2023

## 2023-03-14 NOTE — Progress Notes (Addendum)
   Patient Name: Emily Farmer Date of Encounter: 03/14/2023 [facility] Cardiologist: Chrystie Nose, MD   Interval Summary  .    Feeling well today. Sitting in chair, no dizziness transferring from bed to chair.  Vital Signs .    Vitals:   03/13/23 2307 03/14/23 0319 03/14/23 0500 03/14/23 0731  BP: 116/64 108/64    Pulse: 81 80    Resp: 20 16    Temp: 97.9 F (36.6 C) 98.2 F (36.8 C)  97.8 F (36.6 C)  TempSrc: Oral Oral  Oral  SpO2: 99% 99%    Weight:   80.3 kg   Height:        Intake/Output Summary (Last 24 hours) at 03/14/2023 1610 Last data filed at 03/14/2023 9604 Gross per 24 hour  Intake 120 ml  Output 650 ml  Net -530 ml      03/14/2023    5:00 AM 03/13/2023    5:00 AM 03/12/2023    5:00 AM  Last 3 Weights  Weight (lbs) 177 lb 1.6 oz 179 lb 0.2 oz 175 lb 11.3 oz  Weight (kg) 80.332 kg 81.2 kg 79.7 kg      Telemetry/ECG    100% ventricular paced rhythm at 80 bpm, background atrial fibrillation - Personally Reviewed  Physical Exam .    GEN: Well nourished, well developed, in no acute distress  HEENT: normal  Neck: no JVD, carotid bruits, or masses Cardiac: RRR; no murmurs, rubs, or gallops,no edema  Respiratory:  clear to auscultation bilaterally, normal work of breathing GI: soft, nontender, nondistended, + BS MS: no deformity or atrophy  Skin: warm and dry, device site well healed Neuro:  Strength and sensation are intact Psych: euthymic mood, full affect   Assessment & Plan .     Acute on chronic diastolic HF: mildly volume overloaded. No further dizziness. Continue torsemide. Creatinine stable.  Coronary artery disease: post CABG  Pulmonary hypertension: mild on repeat RHC  OSA: continue CPAP  Permanent AF: post AVN ablation and pacemaker  Acute on chronic CKD III: improving with diuresis  For questions or updates, please contact [facility] Please consult [email or web address] for contact info under         Signed, [name] Jorja Loa, MD

## 2023-03-14 NOTE — TOC Progression Note (Signed)
Transition of Care [facility]) - Progression Note    Patient Details  Name: Emily Farmer MRN: [id number] Date of Birth: September 24, 1955  [facility] CM/SW Contact  Patrice Paradise, LCSW Phone Number: 03/14/2023, 1:31 PM  Clinical Narrative:     Pt's Berkley Harvey has been approved.  [employer] #-  [id number] Health Plan #- [id number] Authorization dates: 03/13/09/02   [facility] team will continue to assist with discharge planning needs.   Expected Discharge Plan: IP Rehab Facility Barriers to Discharge: Continued Medical Work up  Expected Discharge Plan and Services   Discharge Planning Services: CM Consult Post Acute Care Choice: IP Rehab Living arrangements for the past 2 months: Apartment                                       Social Determinants of Health (SDOH) Interventions SDOH Screenings   Food Insecurity: No Food Insecurity (03/02/2023)  Housing: Low Risk  (03/02/2023)  Transportation Needs: No Transportation Needs (03/02/2023)  Utilities: Not At Risk (03/02/2023)  Alcohol Screen: Low Risk  (09/15/2022)  Depression (PHQ2-9): Low Risk  (11/18/2022)  Financial Resource Strain: Medium Risk (09/15/2022)  Physical Activity: Insufficiently Active (09/15/2022)  Social Connections: Socially Isolated (09/15/2022)  Stress: Stress Concern Present (07/21/2022)  Tobacco Use: Low Risk  (03/01/2023)    Readmission Risk Interventions    04/03/2021    2:32 PM  Readmission Risk Prevention Plan  Transportation Screening Complete  PCP or Specialist Appt within 5-7 Days Complete  Home Care Screening Complete  Medication Review (RN CM) Complete

## 2023-03-14 NOTE — Plan of Care (Signed)
Problem: Education: Goal: Knowledge of cardiac device and self-care will improve Outcome: Progressing Goal: Ability to safely manage health related needs after discharge will improve Outcome: Progressing Goal: Individualized Educational Video(s) Outcome: Progressing   Problem: Cardiac: Goal: Ability to achieve and maintain adequate cardiopulmonary perfusion will improve Outcome: Progressing   Problem: Education: Goal: Understanding of disease, treatment, and recovery process will improve Outcome: Progressing   Problem: Activity: Goal: Ability to return to baseline activity level will improve Outcome: Progressing   Problem: Cardiac: Goal: Ability to maintain adequate cardiovascular perfusion will improve Outcome: Progressing Goal: Vascular access site(s) Level 0-1 will be maintained Outcome: Progressing   Problem: Health Behavior/ Discharge Planning: Goal: Ability to safely manage health related needs after discharge Outcome: Progressing   Problem: Education: Goal: Understanding of cardiac disease, CV risk reduction, and recovery process will improve Outcome: Progressing Goal: Individualized Educational Video(s) Outcome: Progressing   Problem: Activity: Goal: Ability to tolerate increased activity will improve Outcome: Progressing   Problem: Cardiac: Goal: Ability to achieve and maintain adequate cardiovascular perfusion will improve Outcome: Progressing   Problem: Health Behavior/Discharge Planning: Goal: Ability to safely manage health-related needs after discharge will improve Outcome: Progressing   Problem: Education: Goal: Ability to describe self-care measures that may prevent or decrease complications (Diabetes Survival Skills Education) will improve Outcome: Progressing Goal: Individualized Educational Video(s) Outcome: Progressing   Problem: Coping: Goal: Ability to adjust to condition or change in health will improve Outcome: Progressing   Problem:  Fluid Volume: Goal: Ability to maintain a balanced intake and output will improve Outcome: Progressing   Problem: Health Behavior/Discharge Planning: Goal: Ability to identify and utilize available resources and services will improve Outcome: Progressing Goal: Ability to manage health-related needs will improve Outcome: Progressing   Problem: Metabolic: Goal: Ability to maintain appropriate glucose levels will improve Outcome: Progressing   Problem: Nutritional: Goal: Maintenance of adequate nutrition will improve Outcome: Progressing Goal: Progress toward achieving an optimal weight will improve Outcome: Progressing   Problem: Skin Integrity: Goal: Risk for impaired skin integrity will decrease Outcome: Progressing   Problem: Tissue Perfusion: Goal: Adequacy of tissue perfusion will improve Outcome: Progressing   Problem: Education: Goal: Knowledge of General Education information will improve Description: Including pain rating scale, medication(s)/side effects and non-pharmacologic comfort measures Outcome: Progressing   Problem: Health Behavior/Discharge Planning: Goal: Ability to manage health-related needs will improve Outcome: Progressing   Problem: Clinical Measurements: Goal: Ability to maintain clinical measurements within normal limits will improve Outcome: Progressing Goal: Will remain free from infection Outcome: Progressing Goal: Diagnostic test results will improve Outcome: Progressing Goal: Respiratory complications will improve Outcome: Progressing Goal: Cardiovascular complication will be avoided Outcome: Progressing   Problem: Activity: Goal: Risk for activity intolerance will decrease Outcome: Progressing   Problem: Nutrition: Goal: Adequate nutrition will be maintained Outcome: Progressing   Problem: Coping: Goal: Level of anxiety will decrease Outcome: Progressing   Problem: Elimination: Goal: Will not experience complications related  to bowel motility Outcome: Progressing Goal: Will not experience complications related to urinary retention Outcome: Progressing   Problem: Pain Managment: Goal: General experience of comfort will improve Outcome: Progressing   Problem: Safety: Goal: Ability to remain free from injury will improve Outcome: Progressing   Problem: Skin Integrity: Goal: Risk for impaired skin integrity will decrease Outcome: Progressing   Problem: Education: Goal: Understanding of CV disease, CV risk reduction, and recovery process will improve Outcome: Progressing Goal: Individualized Educational Video(s) Outcome: Progressing   Problem: Activity: Goal: Ability to return to baseline  activity level will improve Outcome: Progressing   Problem: Cardiovascular: Goal: Ability to achieve and maintain adequate cardiovascular perfusion will improve Outcome: Progressing Goal: Vascular access site(s) Level 0-1 will be maintained Outcome: Progressing

## 2023-03-14 NOTE — Progress Notes (Signed)
Mobility Specialist Progress Note:   03/14/23 1252  Mobility  Activity Ambulated with assistance in hallway  Level of Assistance Minimal assist, patient does 75% or more  Assistive Device Front wheel walker  Distance Ambulated (ft) 160 ft  RUE Weight Bearing NWB  LUE Weight Bearing NWB  Activity Response Tolerated well  Mobility Referral Yes  $Mobility charge 1 Mobility  Mobility Specialist Start Time (ACUTE ONLY) 1045  Mobility Specialist Stop Time (ACUTE ONLY) 1100  Mobility Specialist Time Calculation (min) (ACUTE ONLY) 15 min   Pre Mobility: 82 HR , 126/68 BP , 95% SpO2 1 L During Mobility: 92 HR , 93% SpO2 1 L Post Mobility: 89 HR , 94% SpO2 1 L  Pt received in bed, agreeable to mobility. MinA to stand. CG during ambulation. Pt denied any dizziness or discomfort during ambulation, asymptomatic throughout. Pt returned to bed with call bell in reach and all needs met.    Leory Plowman  Mobility Specialist Please contact via [employer] office at [phone]

## 2023-03-14 NOTE — Progress Notes (Addendum)
CARDIAC REHAB PHASE I   PRE:  Rate/Rhythm: 83 V paced  BP:  Sitting: 101/59      SaO2: 87 RA---96 on 2L  MODE:  Ambulation: 160 ft   AD: RW   POST:  Rate/Rhythm: 98 V paced  BP:  Sitting: 103/66      SaO2: 94 1L  Pt able to get OOB with verbal cues and min assist to stand, her legs did get wobbly upon standing and she reached out to hold onto me to regain balance balance. Pt placed on 2L O2 to raise SaO2 for ambulation, on 2L pt SaO2 maintained >96%, Pt walked well, she took x1 break to rest before returning to room. Pt declined sitting in chair, pt now on 1L O2 in bed.   Faustino Congress  ACSM-CEP 10:30 AM 03/14/2023    Service time is from 1000 to 1030.

## 2023-03-15 ENCOUNTER — Inpatient Hospital Stay (HOSPITAL_COMMUNITY): Payer: Medicare HMO

## 2023-03-15 DIAGNOSIS — I200 Unstable angina: Secondary | ICD-10-CM | POA: Diagnosis not present

## 2023-03-15 LAB — BASIC METABOLIC PANEL
Anion gap: 15 (ref 5–15)
BUN: 48 mg/dL — ABNORMAL HIGH (ref 8–23)
CO2: 24 mmol/L (ref 22–32)
Calcium: 9 mg/dL (ref 8.9–10.3)
Chloride: 93 mmol/L — ABNORMAL LOW (ref 98–111)
Creatinine, Ser: 1.74 mg/dL — ABNORMAL HIGH (ref 0.44–1.00)
GFR, Estimated: 32 mL/min — ABNORMAL LOW (ref >60)
Glucose, Bld: 148 mg/dL — ABNORMAL HIGH (ref 70–99)
Potassium: 4.1 mmol/L (ref 3.5–5.1)
Sodium: 132 mmol/L — ABNORMAL LOW (ref 135–145)

## 2023-03-15 LAB — GLUCOSE, CAPILLARY
Glucose-Capillary: 131 mg/dL — ABNORMAL HIGH (ref 70–99)
Glucose-Capillary: 228 mg/dL — ABNORMAL HIGH (ref 70–99)
Glucose-Capillary: 100 mg/dL — ABNORMAL HIGH (ref 70–99)
Glucose-Capillary: 153 mg/dL — ABNORMAL HIGH (ref 70–99)

## 2023-03-15 LAB — CBC
HCT: 26.2 % — ABNORMAL LOW (ref 36.0–46.0)
Hemoglobin: 8.3 g/dL — ABNORMAL LOW (ref 12.0–15.0)
MCH: 28.5 pg (ref 26.0–34.0)
MCHC: 31.7 g/dL (ref 30.0–36.0)
MCV: 90 fL (ref 80.0–100.0)
Platelets: 313 10*3/uL (ref 150–400)
RBC: 2.91 MIL/uL — ABNORMAL LOW (ref 3.87–5.11)
RDW: 14.9 % (ref 11.5–15.5)
WBC: 7.3 10*3/uL (ref 4.0–10.5)
nRBC: 0 % (ref 0.0–0.2)

## 2023-03-15 NOTE — Progress Notes (Addendum)
6 Days Post-Op Procedure(s) (LRB): CORONARY ARTERY BYPASS GRAFTING (CABG) TIMES THREE USING THE LEFT INTERNAL MAMMARY ARTERY (LIMA) AND ENDOSCOPICALLY HARVESTED RIGHT GREATER SAPHENOUS VEIN (N/A) TRANSESOPHAGEAL ECHOCARDIOGRAM (N/A) Subjective: Conts to feel stronger  Objective: Vital signs in last 24 hours: Temp:  [97.6 F (36.4 C)-98.4 F (36.9 C)] 97.7 F (36.5 C) (09/29 0755) Pulse Rate:  [80-84] 84 (09/29 0338) Cardiac Rhythm: Ventricular paced (09/29 0700) Resp:  [17-20] 18 (09/29 0338) BP: (100-149)/(61-78) 100/61 (09/29 0755) SpO2:  [94 %-98 %] 94 % (09/29 0338) Weight:  [78.7 kg] 78.7 kg (09/29 0300)  Hemodynamic parameters for last 24 hours:    Intake/Output from previous day: 09/28 0701 - 09/29 0700 In: 1020 [P.O.:1020] Out: 3450 [Urine:3450] Intake/Output this shift: No intake/output data recorded.  General appearance: alert, cooperative, and no distress Heart: regular rate and rhythm Lungs: fair air exchange, dim in left lower fields Abdomen: benign Extremities: improved LE edema Wound: incis healing well  Lab Results: Recent Labs    03/14/23 0314 03/15/23 0324  WBC 6.6 7.3  HGB 7.4* 8.3*  HCT 23.6* 26.2*  PLT 220 313   BMET:  Recent Labs    03/14/23 0314 03/15/23 0324  NA 132* 132*  K 4.6 4.1  CL 98 93*  CO2 27 24  GLUCOSE 198* 148*  BUN 43* 48*  CREATININE 1.61* 1.74*  CALCIUM 8.1* 9.0    PT/INR: No results for input(s): "LABPROT", "INR" in the last 72 hours. ABG    Component Value Date/Time   PHART 7.394 03/09/2023 1901   HCO3 22.2 03/09/2023 1901   TCO2 23 03/09/2023 1901   ACIDBASEDEF 2.0 03/09/2023 1901   O2SAT 100 03/09/2023 1901   CBG (last 3)  Recent Labs    03/14/23 1559 03/14/23 2113 03/15/23 0603  GLUCAP 72 136* 131*    Meds Scheduled Meds:  aspirin EC  325 mg Oral Daily   atorvastatin  40 mg Oral Daily   fenofibrate  160 mg Oral Daily   ferrous sulfate  325 mg Oral Q breakfast   insulin aspart  0-24 Units  Subcutaneous TID WC   insulin aspart  6 Units Subcutaneous TID WC   insulin detemir  30 Units Subcutaneous Daily   midodrine  10 mg Oral TID WC   pantoprazole  40 mg Oral Daily   sodium chloride flush  3 mL Intravenous Q12H   torsemide  40 mg Oral Daily   Continuous Infusions:  sodium chloride     PRN Meds:.sodium chloride, acetaminophen, loperamide, ondansetron (ZOFRAN) IV, mouth rinse, oxyCODONE, sodium chloride flush, traMADol  Xrays No results found.  Assessment/Plan: S/P Procedure(s) (LRB): CORONARY ARTERY BYPASS GRAFTING (CABG) TIMES THREE USING THE LEFT INTERNAL MAMMARY ARTERY (LIMA) AND ENDOSCOPICALLY HARVESTED RIGHT GREATER SAPHENOUS VEIN (N/A) TRANSESOPHAGEAL ECHOCARDIOGRAM (N/A) POD#6   1 afeb, VSS S BP 100's-140's, Vpaced(PPM) 2 sats good on 1 liter 3 good UOP, weight trending lower, BUN/Creat greater than yesterday 48/1.74, torsemide order expired- will cont to hold for now 4 H/H improved w/equilibration to 8.3/26.2 5 CXR - left basilar effus/atx- monitor as may require thoracentesis  6 conts pulm hygiene and rehab      LOS: 11 days    Rowe Clack PA-C Pager [phone] 03/15/2023 Patient seen and examined, agree with findings and plan as outlined above 2.5 L negative yesterday, creatinine bumped to 1.74 from 1.61, hold off on additional diuresis today although she will need more going forward  [place] C. Dorris Fetch, MD [facility] (  [phone]  

## 2023-03-15 NOTE — Progress Notes (Signed)
Mobility Specialist Progress Note:   03/15/23 1202  Mobility  Activity Ambulated with assistance in hallway  Level of Assistance Contact guard assist, steadying assist  Assistive Device Front wheel walker  Distance Ambulated (ft) 225 ft  RUE Weight Bearing NWB  LUE Weight Bearing NWB  Activity Response Tolerated well  Mobility Referral Yes  $Mobility charge 1 Mobility  Mobility Specialist Start Time (ACUTE ONLY) 0920  Mobility Specialist Stop Time (ACUTE ONLY) 0940  Mobility Specialist Time Calculation (min) (ACUTE ONLY) 20 min   Pre Mobility: 80 HR , 113/64 BP , 95% SpO2 1 L During Mobility: 94 HR , 87-97% SpO2 1 L Post Mobility: 87 HR , 95% SpO2 1 L   Pt received in bed, agreeable to mobility. Denied any discomfort during ambulation, asymptomatic throughout. Pt left in chair with call bell in reach and all needs met.  Leory Plowman  Mobility Specialist Please contact via [employer] office at [phone]

## 2023-03-15 NOTE — Progress Notes (Signed)
   Patient Name: Emily Farmer Date of Encounter: 03/15/2023 [facility] Cardiologist: Chrystie Nose, MD   Interval Summary  .    Patient feeling well.  Only mild dizziness.  Net -2.4 L yesterday.  Vital Signs .    Vitals:   03/14/23 2322 03/15/23 0300 03/15/23 0338 03/15/23 0755  BP: (!) 149/76  118/71 100/61  Pulse: 80  84   Resp: 17  18   Temp: 98.4 F (36.9 C)  97.7 F (36.5 C) 97.7 F (36.5 C)  TempSrc: Oral  Oral Oral  SpO2: 98%  94%   Weight:  78.7 kg    Height:        Intake/Output Summary (Last 24 hours) at 03/15/2023 0912 Last data filed at 03/15/2023 0636 Gross per 24 hour  Intake 840 ml  Output 3450 ml  Net -2610 ml      03/15/2023    3:00 AM 03/14/2023    5:00 AM 03/13/2023    5:00 AM  Last 3 Weights  Weight (lbs) 173 lb 8 oz 177 lb 1.6 oz 179 lb 0.2 oz  Weight (kg) 78.7 kg 80.332 kg 81.2 kg      Telemetry/ECG    100% ventricular paced rhythm at 80 bpm, background atrial fibrillation - Personally Reviewed  Physical Exam .    GEN: Well nourished, well developed, in no acute distress  HEENT: normal  Neck: no JVD, carotid bruits, or masses Cardiac: RRR; no murmurs, rubs, or gallops,1+ edema  Respiratory:  clear to auscultation bilaterally, normal work of breathing GI: soft, nontender, nondistended, + BS MS: no deformity or atrophy  Skin: warm and dry, device site well healed Neuro:  Strength and sensation are intact Psych: euthymic mood, full affect   Assessment & Plan .     1.  Acute on chronic diastolic heart failure: Continues to be mildly volume overloaded.  Good diuresis yesterday.  Creatinine has gone up.  Vicie Cech hold diuresis today.  2.  Coronary artery disease: Post CABG  3.  Pulmonary pretension: Mild on right heart cath  4.  Obstructive sleep apnea: CPAP compliance encouraged  5.  Permanent atrial fibrillation: Post AV node ablation pacemaker  6.  Acute on chronic CKD stage III: monitoring with diuresis  For questions  or updates, please contact [facility] Please consult [email or web address] for contact info under        Signed, Ayeshia Coppin Jorja Loa, MD

## 2023-03-16 ENCOUNTER — Inpatient Hospital Stay (HOSPITAL_COMMUNITY): Payer: Medicare HMO

## 2023-03-16 DIAGNOSIS — Z951 Presence of aortocoronary bypass graft: Secondary | ICD-10-CM

## 2023-03-16 DIAGNOSIS — I4821 Permanent atrial fibrillation: Secondary | ICD-10-CM | POA: Diagnosis not present

## 2023-03-16 DIAGNOSIS — I5033 Acute on chronic diastolic (congestive) heart failure: Secondary | ICD-10-CM | POA: Diagnosis not present

## 2023-03-16 HISTORY — PX: IR THORACENTESIS ASP PLEURAL SPACE W/IMG GUIDE: IMG5380

## 2023-03-16 LAB — GLUCOSE, CAPILLARY
Glucose-Capillary: 85 mg/dL (ref 70–99)
Glucose-Capillary: 214 mg/dL — ABNORMAL HIGH (ref 70–99)
Glucose-Capillary: 112 mg/dL — ABNORMAL HIGH (ref 70–99)
Glucose-Capillary: 111 mg/dL — ABNORMAL HIGH (ref 70–99)

## 2023-03-16 LAB — BASIC METABOLIC PANEL
Anion gap: 10 (ref 5–15)
BUN: 44 mg/dL — ABNORMAL HIGH (ref 8–23)
CO2: 28 mmol/L (ref 22–32)
Calcium: 8.7 mg/dL — ABNORMAL LOW (ref 8.9–10.3)
Chloride: 93 mmol/L — ABNORMAL LOW (ref 98–111)
Creatinine, Ser: 1.57 mg/dL — ABNORMAL HIGH (ref 0.44–1.00)
GFR, Estimated: 36 mL/min — ABNORMAL LOW (ref >60)
Glucose, Bld: 102 mg/dL — ABNORMAL HIGH (ref 70–99)
Potassium: 4.3 mmol/L (ref 3.5–5.1)
Sodium: 131 mmol/L — ABNORMAL LOW (ref 135–145)

## 2023-03-16 MED ORDER — LIDOCAINE HCL 1 % IJ SOLN
INTRAMUSCULAR | Status: AC
  Filled 2023-03-16: qty 20

## 2023-03-16 MED ORDER — MIDODRINE HCL 5 MG PO TABS
5.0000 mg | ORAL_TABLET | Freq: Three times a day (TID) | ORAL | Status: DC
  Administered 2023-03-16 (×3): 5 mg via ORAL
  Filled 2023-03-16 (×3): qty 1

## 2023-03-16 NOTE — Plan of Care (Signed)
Problem: Education: Goal: Knowledge of cardiac device and self-care will improve Outcome: Progressing Goal: Ability to safely manage health related needs after discharge will improve Outcome: Progressing Goal: Individualized Educational Video(s) Outcome: Progressing   Problem: Cardiac: Goal: Ability to achieve and maintain adequate cardiopulmonary perfusion will improve Outcome: Progressing   Problem: Education: Goal: Understanding of disease, treatment, and recovery process will improve Outcome: Progressing   Problem: Activity: Goal: Ability to return to baseline activity level will improve Outcome: Progressing   Problem: Cardiac: Goal: Ability to maintain adequate cardiovascular perfusion will improve Outcome: Progressing Goal: Vascular access site(s) Level 0-1 will be maintained Outcome: Progressing   Problem: Health Behavior/ Discharge Planning: Goal: Ability to safely manage health related needs after discharge Outcome: Progressing   Problem: Education: Goal: Understanding of cardiac disease, CV risk reduction, and recovery process will improve Outcome: Progressing Goal: Individualized Educational Video(s) Outcome: Progressing   Problem: Activity: Goal: Ability to tolerate increased activity will improve Outcome: Progressing   Problem: Cardiac: Goal: Ability to achieve and maintain adequate cardiovascular perfusion will improve Outcome: Progressing   Problem: Health Behavior/Discharge Planning: Goal: Ability to safely manage health-related needs after discharge will improve Outcome: Progressing   Problem: Education: Goal: Ability to describe self-care measures that may prevent or decrease complications (Diabetes Survival Skills Education) will improve Outcome: Progressing Goal: Individualized Educational Video(s) Outcome: Progressing   Problem: Coping: Goal: Ability to adjust to condition or change in health will improve Outcome: Progressing   Problem:  Fluid Volume: Goal: Ability to maintain a balanced intake and output will improve Outcome: Progressing   Problem: Health Behavior/Discharge Planning: Goal: Ability to identify and utilize available resources and services will improve Outcome: Progressing Goal: Ability to manage health-related needs will improve Outcome: Progressing   Problem: Metabolic: Goal: Ability to maintain appropriate glucose levels will improve Outcome: Progressing   Problem: Nutritional: Goal: Maintenance of adequate nutrition will improve Outcome: Progressing Goal: Progress toward achieving an optimal weight will improve Outcome: Progressing   Problem: Skin Integrity: Goal: Risk for impaired skin integrity will decrease Outcome: Progressing   Problem: Tissue Perfusion: Goal: Adequacy of tissue perfusion will improve Outcome: Progressing   Problem: Education: Goal: Knowledge of General Education information will improve Description: Including pain rating scale, medication(s)/side effects and non-pharmacologic comfort measures Outcome: Progressing   Problem: Health Behavior/Discharge Planning: Goal: Ability to manage health-related needs will improve Outcome: Progressing   Problem: Clinical Measurements: Goal: Ability to maintain clinical measurements within normal limits will improve Outcome: Progressing Goal: Will remain free from infection Outcome: Progressing Goal: Diagnostic test results will improve Outcome: Progressing Goal: Respiratory complications will improve Outcome: Progressing Goal: Cardiovascular complication will be avoided Outcome: Progressing   Problem: Activity: Goal: Risk for activity intolerance will decrease Outcome: Progressing   Problem: Nutrition: Goal: Adequate nutrition will be maintained Outcome: Progressing   Problem: Coping: Goal: Level of anxiety will decrease Outcome: Progressing   Problem: Elimination: Goal: Will not experience complications related  to bowel motility Outcome: Progressing Goal: Will not experience complications related to urinary retention Outcome: Progressing   Problem: Pain Managment: Goal: General experience of comfort will improve Outcome: Progressing   Problem: Safety: Goal: Ability to remain free from injury will improve Outcome: Progressing   Problem: Skin Integrity: Goal: Risk for impaired skin integrity will decrease Outcome: Progressing   Problem: Education: Goal: Understanding of CV disease, CV risk reduction, and recovery process will improve Outcome: Progressing Goal: Individualized Educational Video(s) Outcome: Progressing   Problem: Activity: Goal: Ability to return to baseline  activity level will improve Outcome: Progressing   Problem: Cardiovascular: Goal: Ability to achieve and maintain adequate cardiovascular perfusion will improve Outcome: Progressing Goal: Vascular access site(s) Level 0-1 will be maintained Outcome: Progressing

## 2023-03-16 NOTE — Progress Notes (Signed)
7 Days Post-Op Procedure(s) (LRB): CORONARY ARTERY BYPASS GRAFTING (CABG) TIMES THREE USING THE LEFT INTERNAL MAMMARY ARTERY (LIMA) AND ENDOSCOPICALLY HARVESTED RIGHT GREATER SAPHENOUS VEIN (N/A) TRANSESOPHAGEAL ECHOCARDIOGRAM (N/A) Subjective: Some shortness of breath with ambulation. Still on oxygen. Loose stool over weekend and stool softeners stopped. Imodium added. Sitting up on side of bed eating breakfast.  Objective: Vital signs in last 24 hours: Temp:  [97.7 F (36.5 C)-98.3 F (36.8 C)] 97.7 F (36.5 C) (09/30 0406) Pulse Rate:  [80-83] 81 (09/30 0406) Cardiac Rhythm: Ventricular paced (09/29 1947) Resp:  [17-20] 20 (09/30 0406) BP: (100-142)/(52-79) 133/74 (09/30 0406) SpO2:  [93 %-100 %] 100 % (09/30 0406) Weight:  [79.2 kg] 79.2 kg (09/30 0345)  Hemodynamic parameters for last 24 hours:    Intake/Output from previous day: 09/29 0701 - 09/30 0700 In: 780 [P.O.:780] Out: 1850 [Urine:1850] Intake/Output this shift: No intake/output data recorded.  General appearance: alert and cooperative Neurologic: intact Heart: regular rate and rhythm Lungs: diminished breath sounds LLL Extremities: edema mild Wound: incision healing well.  Lab Results: Recent Labs    03/14/23 0314 03/15/23 0324  WBC 6.6 7.3  HGB 7.4* 8.3*  HCT 23.6* 26.2*  PLT 220 313   BMET:  Recent Labs    03/15/23 0324 03/16/23 0352  NA 132* 131*  K 4.1 4.3  CL 93* 93*  CO2 24 28  GLUCOSE 148* 102*  BUN 48* 44*  CREATININE 1.74* 1.57*  CALCIUM 9.0 8.7*    PT/INR: No results for input(s): "LABPROT", "INR" in the last 72 hours. ABG    Component Value Date/Time   PHART 7.394 03/09/2023 1901   HCO3 22.2 03/09/2023 1901   TCO2 23 03/09/2023 1901   ACIDBASEDEF 2.0 03/09/2023 1901   O2SAT 100 03/09/2023 1901   CBG (last 3)  Recent Labs    03/15/23 1618 03/15/23 2200 03/16/23 0548  GLUCAP 100* 153* 85   CXR yesterday showed moderate left pleural  effusion.  Assessment/Plan: S/P Procedure(s) (LRB): CORONARY ARTERY BYPASS GRAFTING (CABG) TIMES THREE USING THE LEFT INTERNAL MAMMARY ARTERY (LIMA) AND ENDOSCOPICALLY HARVESTED RIGHT GREATER SAPHENOUS VEIN (N/A) TRANSESOPHAGEAL ECHOCARDIOGRAM (N/A)  POD 7 Hemodynamically stable. BP is coming up now. Will decrease midodrine to 5 tid.  Wt is still about 8 lbs over preop. Diuretics stopped because creat rising but creat back down some today to 1.57. Will hold off on further diuresis today and repeat BMET tomorrow.  DM: glucose under adequate control.  CXR shows moderate left pleural effusion. Will ask IR to consider thoracentesis..  Continue IS, ambulation.   LOS: 12 days    Emily Farmer 03/16/2023

## 2023-03-16 NOTE — Progress Notes (Addendum)
Mobility Specialist Progress Note:   03/16/23 1435  Mobility  Activity Ambulated with assistance in hallway  Level of Assistance Contact guard assist, steadying assist  Assistive Device Four wheel walker  Distance Ambulated (ft) 340 ft  Mobility Referral Yes  $Mobility charge 1 Mobility  Mobility Specialist Start Time (ACUTE ONLY) 1405  Mobility Specialist Stop Time (ACUTE ONLY) 1425  Mobility Specialist Time Calculation (min) (ACUTE ONLY) 20 min   During Mobility: 95 HR ,  97% SpO2 2 L  Post Mobility: 91 HR , 98% SpO2 2 L  Pt received in chair, agreeable to mobility. Prior to standing pt requesting to use BR. Void successful. Independent with pericare. Pt denied any discomfort during ambulation, asymptomatic throughout. Pt left in bed with call bell in reach and all needs met.   Leory Plowman  Mobility Specialist Please contact via [employer] office at [phone]

## 2023-03-16 NOTE — Procedures (Signed)
PROCEDURE SUMMARY:  Successful image-guided left thoracentesis. Yielded 300 milliliters of light amber fluid. Patient tolerated procedure well. EBL < 1 mL No immediate complications.  Specimen was not sent for labs. Post procedure CXR shows no pneumothorax.  Please see imaging section of Epic for full dictation.  Villa Herb PA-C 03/16/2023 11:05 AM

## 2023-03-16 NOTE — TOC Progression Note (Signed)
Transition of Care [facility]) - Progression Note    Patient Details  Name: Emily Farmer MRN: [id number] Date of Birth: [birth year]  Transition of Care [facility]) CM/SW Contact  Eduard Roux, [place] Phone Number: 03/16/2023, 11:43 AM  Clinical Narrative:     Francee Gentile, patient not stable for d/c today. Auth approved 09/28-10/2.   Antony Blackbird, MSW, LCSW Clinical Social Worker    Expected Discharge Plan: IP Rehab Facility Barriers to Discharge: Continued Medical Work up  Expected Discharge Plan and Services   Discharge Planning Services: CM Consult Post Acute Care Choice: IP Rehab Living arrangements for the past 2 months: Apartment                                       Social Determinants of Health (SDOH) Interventions SDOH Screenings   Food Insecurity: No Food Insecurity (03/02/2023)  Housing: Low Risk  (03/02/2023)  Transportation Needs: No Transportation Needs (03/02/2023)  Utilities: Not At Risk (03/02/2023)  Alcohol Screen: Low Risk  (09/15/2022)  Depression (PHQ2-9): Low Risk  (11/18/2022)  Financial Resource Strain: Medium Risk (09/15/2022)  Physical Activity: Insufficiently Active (09/15/2022)  Social Connections: Socially Isolated (09/15/2022)  Stress: Stress Concern Present (07/21/2022)  Tobacco Use: Low Risk  (03/01/2023)    Readmission Risk Interventions    04/03/2021    2:32 PM  Readmission Risk Prevention Plan  Transportation Screening Complete  PCP or Specialist Appt within 5-7 Days Complete  Home Care Screening Complete  Medication Review (RN CM) Complete

## 2023-03-16 NOTE — Progress Notes (Addendum)
CARDIAC REHAB PHASE I   PRE:  Rate/Rhythm: 86 pacing    BP: sitting 129/88    SpO2: 97 1 1/2L  MODE:  Ambulation: 340 ft   POST:  Rate/Rhythm: 92 pacing    BP: sitting 140/77     SpO2: 93 1L  Pt reluctant to walk due to news of pleural effusion however tolerated walk well. She stood independently and walked with standby assist with rollator and 1L O2. Slow and steady pace, no noted SOB. To bed after walk, VSS. Encouraged IS, only 250 ml at this time. Will walk more later.  [id number]   Ethelda Chick BS, ACSM-CEP 03/16/2023 9:50 AM

## 2023-03-16 NOTE — Progress Notes (Signed)
Patient Name: Emily Farmer Date of Encounter: 03/16/2023 [facility] Cardiologist: Chrystie Nose, MD   Interval Summary  .    67 yr old female with PMH of insulin dependent type 2 DM, HLD, permanent A fib s/p AV nde ablation 01/02/23, single chamber PPM in situ, who was admitted on 03/01/23 for exertional angina and SOB. Hs trop 19 >29. Echo EF 60-65% RV mildly reduced, RA moderately dilated, RVSP 85.2, mild MR, mod TR. R/L heart cath 03/03/23 showed multivessel CAD, elevated filling pressures with RA 19, PA 82/28, PCWP 36, PVR 2.9, CO 4.7, and CI 2.57. Seen by AHF team, s/p IV Lasix, repeat RHC 03/06/23 with RA 3, PA 47/11, PCWP 14 with prominent V, PVR 3.3, CO 4.21 and CI 2.37, much improved filling pressures with diuresis. Cardiomyopathy etiology is felt due to diastolic failure. Lasix/spironolactone stopped 03/05/23. Course complicated by AKI. Now s/p CABG x3 with SVG to LAD, SVG to PL, SVG to PDA on 03/09/23.   Patient seen after return from thoracentesis. Tolerated this well. Main complaint is general feeling of tightness/soreness throughout her right leg, which has been intermittent since surgery. She was able to ambulate in the hall but noted that her leg was sore after.  Vital Signs .    Vitals:   03/16/23 0406 03/16/23 0804 03/16/23 1117 03/16/23 1603  BP: 133/74 109/64 133/62 (!) 118/91  Pulse: 81 91 81 81  Resp: 20 12 20 17   Temp: 97.7 F (36.5 C) 97.6 F (36.4 C) 97.9 F (36.6 C) 97.9 F (36.6 C)  TempSrc: Oral Oral Oral Oral  SpO2: 100% 100% 100% 98%  Weight:      Height:        Intake/Output Summary (Last 24 hours) at 03/16/2023 1620 Last data filed at 03/16/2023 1600 Gross per 24 hour  Intake 1023 ml  Output 1050 ml  Net -27 ml      03/16/2023    3:45 AM 03/15/2023    3:00 AM 03/14/2023    5:00 AM  Last 3 Weights  Weight (lbs) 174 lb 9.6 oz 173 lb 8 oz 177 lb 1.6 oz  Weight (kg) 79.198 kg 78.7 kg 80.332 kg      Telemetry/ECG    V paced rhythm  -  Personally Reviewed  Physical Exam .   GEN: No acute distress.   Neck: No JVD Cardiac: RRR, no murmurs, rubs, or gallops.  Respiratory: Clear to auscultation bilaterally, slightly diminished L base GI: Soft, nontender, non-distended  MS: mild bilateral LE edema  Assessment & Plan .     Multivessel CAD - s/p CABG x3 SVG to LAD, SVG to PL, SVG to PDA on 03/09/23 - Post op care per CT surgery team  - Medical therapy: continue lipitor 40mg  daily, fenofibrate 160mg  daily, no BB due to hypotension, on ASA 325mg  per CT surgery  - Follow up with cardiology arranged on 03/30/23   Acute on chronic diastolic heart failure with prominent RV dysfunction  - Echo EF 60-65% RV mildly reduced, RA moderately dilated, RVSP 85.2, mild MR, mod TR. - R/L heart cath 03/03/23 showed multivessel CAD, elevated filling pressures with RA 19, PA 82/28, PCWP 36, PVR 2.9, CO 4.7, and CI 2.57. Seen by AHF team, s/p IV Lasix, repeat RHC 03/06/23 with RA 3, PA 47/11, PCWP 14 with prominent V, PVR 3.3, CO 4.21 and CI 2.37, much improved filling pressures with diuresis. Cardiomyopathy etiology is felt due to diastolic failure, MR is mild. -first hospital  weight 77.1 kg (reported as 82 kg on presentation), weight 79.2 kg today - clinically slowly improving - recommend GDMT: resume PO Lasix 40mg  daily when renal function back to baseline; no SGLT2I given recent UTI; Would add back MRA if renal function better at some point    Pulmonary hypertension - Severe pulmonary venous hypertension with PVR only 2.9 WU on 9/17 cath. Repeat RHC with mild pulmonary hypertension (significant improvement with diuresis), PVR now 3.3. OSA maybe contributing.   AKI on CKD IIIa - Cr baseline appears to be 1-1.1 with GFR >50, 1.21  POA, initially worsened to 1.5 range due to acute diastolic heart failure, with diuresis, Cr peaked to 1.79 and down to 1 on 03/09/23, diuretic held after RHC on 03/05/22 due to low RA pressure/hypotension/rising  Cr/improved CHF. Post CABG Lasix /torsemide was given, diuretic stopped now with last dose torsemide 40mg  x1 given on 03/14/23, Cr back up to 1.74 on 03/15/23, with holding diuretic Cr down to 1.57 today.  - Likely diuresis related changes, trend BMP, monitor UOP, avoid nephrotoxin   Permanent A fib with hx of of AV nodal ablation and single chamber medtronic PPM implant  - device interrogation at ED this admission by Dr Royann Shivers showed normal function, 100% ventricular pacing, no episode of high ventricular rate, lead parameters are excellent. Estimated generator longevity is 14 years.  - PTA Eliquis has been held for CABG, resume per CT surgery  - PTA diltiazem 120mg  daily and metoprolol XL 100mg  daily had been stopped due to hypotension requiring midodrine, OK to remain off given AV nodal ablation  Left pleural effusion Type 2 DM Anemia  Hyponatremia  - per primary service   For questions or updates, please contact [facility] Please consult [email or web address] for contact info under     Signed, Jodelle Red, MD

## 2023-03-17 ENCOUNTER — Inpatient Hospital Stay (HOSPITAL_COMMUNITY): Payer: Medicare HMO

## 2023-03-17 DIAGNOSIS — I4821 Permanent atrial fibrillation: Secondary | ICD-10-CM | POA: Diagnosis not present

## 2023-03-17 DIAGNOSIS — Z951 Presence of aortocoronary bypass graft: Secondary | ICD-10-CM | POA: Diagnosis not present

## 2023-03-17 LAB — GLUCOSE, CAPILLARY
Glucose-Capillary: 116 mg/dL — ABNORMAL HIGH (ref 70–99)
Glucose-Capillary: 211 mg/dL — ABNORMAL HIGH (ref 70–99)
Glucose-Capillary: 279 mg/dL — ABNORMAL HIGH (ref 70–99)
Glucose-Capillary: 71 mg/dL (ref 70–99)
Glucose-Capillary: 235 mg/dL — ABNORMAL HIGH (ref 70–99)

## 2023-03-17 LAB — BASIC METABOLIC PANEL
Anion gap: 13 (ref 5–15)
BUN: 42 mg/dL — ABNORMAL HIGH (ref 8–23)
CO2: 26 mmol/L (ref 22–32)
Calcium: 9 mg/dL (ref 8.9–10.3)
Chloride: 94 mmol/L — ABNORMAL LOW (ref 98–111)
Creatinine, Ser: 1.48 mg/dL — ABNORMAL HIGH (ref 0.44–1.00)
GFR, Estimated: 39 mL/min — ABNORMAL LOW (ref >60)
Glucose, Bld: 183 mg/dL — ABNORMAL HIGH (ref 70–99)
Potassium: 4.1 mmol/L (ref 3.5–5.1)
Sodium: 133 mmol/L — ABNORMAL LOW (ref 135–145)

## 2023-03-17 MED ORDER — TORSEMIDE 20 MG PO TABS
20.0000 mg | ORAL_TABLET | Freq: Every day | ORAL | Status: DC
  Administered 2023-03-17 – 2023-03-18 (×2): 20 mg via ORAL
  Filled 2023-03-17 (×2): qty 1

## 2023-03-17 MED ORDER — INSULIN DETEMIR 100 UNIT/ML ~~LOC~~ SOLN
30.0000 [IU] | Freq: Every day | SUBCUTANEOUS | Status: DC
  Filled 2023-03-17: qty 0.3

## 2023-03-17 MED ORDER — ASPIRIN 81 MG PO TBEC
81.0000 mg | DELAYED_RELEASE_TABLET | Freq: Every day | ORAL | Status: DC
  Administered 2023-03-18: 81 mg via ORAL
  Filled 2023-03-17: qty 1

## 2023-03-17 MED ORDER — TRAMADOL HCL 50 MG PO TABS
50.0000 mg | ORAL_TABLET | ORAL | Status: DC | PRN
  Administered 2023-03-18: 50 mg via ORAL
  Filled 2023-03-17: qty 1

## 2023-03-17 MED ORDER — APIXABAN 5 MG PO TABS
5.0000 mg | ORAL_TABLET | Freq: Two times a day (BID) | ORAL | Status: DC
  Administered 2023-03-17 – 2023-03-18 (×3): 5 mg via ORAL
  Filled 2023-03-17 (×3): qty 1

## 2023-03-17 MED ORDER — INSULIN DETEMIR 100 UNIT/ML ~~LOC~~ SOLN
30.0000 [IU] | Freq: Every day | SUBCUTANEOUS | Status: DC
  Administered 2023-03-17: 30 [IU] via SUBCUTANEOUS
  Filled 2023-03-17 (×2): qty 0.3

## 2023-03-17 MED ORDER — MIDODRINE HCL 5 MG PO TABS
5.0000 mg | ORAL_TABLET | Freq: Two times a day (BID) | ORAL | Status: DC
  Administered 2023-03-17 – 2023-03-18 (×2): 5 mg via ORAL
  Filled 2023-03-17 (×2): qty 1

## 2023-03-17 NOTE — Progress Notes (Signed)
CARDIAC REHAB PHASE I   PRE:  Rate/Rhythm: 89 SR paced  BP:  Sitting: 96/57      SaO2: 96 RA  MODE:  Ambulation: 230 ft   AD:   rollator  POST:  Rate/Rhythm: 92 SR paced  BP:  Sitting: 95/57      SaO2: 94 RA  Pt on 2L on arrival, pt weaned to RA with SaO2 >94%,. Pt ambulated in the hallway with gait belt assist. There was a brief drop in SaO2 but it returned and maintained a normal range after a brief rest. Pt returned to bed after ambulation.  Faustino Congress  ACSM-CEP 1:47 PM 03/17/2023    Service time is from 1316 to 1352.

## 2023-03-17 NOTE — Plan of Care (Signed)
Plan of care was review. Pt is progressing, stable hemodynamically. No acute distress noted overnight.   Problem: Cardiac: Goal: Ability to achieve and maintain adequate cardiopulmonary perfusion will improve Outcome: Progressing   Problem: Activity: Goal: Ability to return to baseline activity level will improve Outcome: Progressing: well tolerated ambulation with one standby assist with walker in the room. No SOB.   Problem: Cardiac: Goal: Ability to maintain adequate cardiovascular perfusion will improve Outcome: Progressing Goal: Vascular access site(s) Level 0-1 will be maintained Outcome: Progressing  Filiberto Pinks, RN

## 2023-03-17 NOTE — Progress Notes (Signed)
Physical Therapy Treatment Patient Details Name: Emily Farmer MRN: [id number] DOB: 12-10-1955 Today's Date: 03/17/2023   History of Present Illness Pt is a 67 y/o female admitted 9/15 with angina/CP and dyspnea.  Heart cath showed multivessel disease, Pt s/p CABG x4 on 9/23.  PMHx:  afib, chronic dCHF, DM, HTN, HLD, pacer.    PT Comments  Pt received sitting EOB and agreeable to session. Pt able to tolerate increased gait distance with supervision this session, however remains limited by impaired activity tolerance. Pt on RA throughout session with SpO2 WFL and pt not demonstrating DOE. Pt maintaining sternal precautions without cues. Pt continues to benefit from PT services to progress toward functional mobility goals.     If plan is discharge home, recommend the following: A little help with walking and/or transfers;A little help with bathing/dressing/bathroom;Two people to help with bathing/dressing/bathroom;Assist for transportation   Can travel by private vehicle     Yes  Equipment Recommendations  Rolling walker (2 wheels);BSC/3in1    Recommendations for Other Services       Precautions / Restrictions Precautions Precautions: Sternal;Fall Restrictions Weight Bearing Restrictions: Yes Other Position/Activity Restrictions: sternal precautions     Mobility  Bed Mobility               General bed mobility comments: Pt beginning and ending session sitting EOB    Transfers Overall transfer level: Needs assistance Equipment used: Rolling walker (2 wheels) Transfers: Sit to/from Stand Sit to Stand: Contact guard assist           General transfer comment: Pt demonstrates good hand placement without cues and slow, steady power up    Ambulation/Gait Ambulation/Gait assistance: Supervision Gait Distance (Feet): 150 Feet Assistive device: Rolling walker (2 wheels) Gait Pattern/deviations: Step-through pattern, Trunk flexed, Decreased stride length        General Gait Details: slow, steady gait with cues for upright posture. SpO2 stable on RA      Balance Overall balance assessment: Needs assistance Sitting-balance support: No upper extremity supported, Feet supported Sitting balance-Leahy Scale: Good Sitting balance - Comments: sitting EOB   Standing balance support: Bilateral upper extremity supported, During functional activity Standing balance-Leahy Scale: Fair Standing balance comment: with RW support                            Cognition Arousal: Alert Behavior During Therapy: WFL for tasks assessed/performed Overall Cognitive Status: Within Functional Limits for tasks assessed                                          Exercises      General Comments General comments (skin integrity, edema, etc.): VSS on RA throughout session. SpO2 95% on RA at end of session so [place] left off and RN notified      Pertinent Vitals/Pain Pain Assessment Pain Assessment: Faces Faces Pain Scale: No hurt     PT Goals (current goals can now be found in the care plan section) Acute Rehab PT Goals Patient Stated Goal: home when able PT Goal Formulation: With patient Time For Goal Achievement: 03/26/23 Progress towards PT goals: Progressing toward goals    Frequency    Min 1X/week       AM-PAC PT "6 Clicks" Mobility   Outcome Measure  Help needed turning from your back to your side while in a  flat bed without using bedrails?: A Little Help needed moving from lying on your back to sitting on the side of a flat bed without using bedrails?: A Little Help needed moving to and from a bed to a chair (including a wheelchair)?: A Little Help needed standing up from a chair using your arms (e.g., wheelchair or bedside chair)?: A Little Help needed to walk in hospital room?: A Little Help needed climbing 3-5 steps with a railing? : A Little 6 Click Score: 18    End of Session   Activity Tolerance: Patient  tolerated treatment well Patient left: with call bell/phone within reach;in bed;with bed alarm set (sitting EOB) Nurse Communication: Mobility status (O2 sats) PT Visit Diagnosis: Unsteadiness on feet (R26.81);Other abnormalities of gait and mobility (R26.89);Pain;Difficulty in walking, not elsewhere classified (R26.2)     Time: 1308-6578 PT Time Calculation (min) (ACUTE ONLY): 14 min  Charges:    $Gait Training: 8-22 mins PT General Charges $$ ACUTE PT VISIT: 1 Visit                     Johny Shock, PTA Acute Rehabilitation Services Secure Chat Preferred  [phone]    Johny Shock 03/17/2023, 12:50 PM

## 2023-03-17 NOTE — Progress Notes (Addendum)
[address]       [place] [address]             [phone]       8 Days Post-Op Procedure(s) (LRB): CORONARY ARTERY BYPASS GRAFTING (CABG) TIMES THREE USING THE LEFT INTERNAL MAMMARY ARTERY (LIMA) AND ENDOSCOPICALLY HARVESTED RIGHT GREATER SAPHENOUS VEIN (N/A) TRANSESOPHAGEAL ECHOCARDIOGRAM (N/A)  Subjective:  Patient states she is sore all over.  Just moved to the chair.  Also states she thinks she slept wrong on her left arm last night as it was a little sore this morning.  She feels like she is breathing better after her procedure yesterday.  She has moved her bowels.  Objective: Vital signs in last 24 hours: Temp:  [97.9 F (36.6 C)-98.6 F (37 C)] 98.6 F (37 C) (10/01 0331) Pulse Rate:  [80-83] 80 (10/01 0331) Cardiac Rhythm: Normal sinus rhythm (10/01 0414) Resp:  [14-20] 14 (10/01 0331) BP: (118-133)/(62-91) 124/67 (10/01 0331) SpO2:  [97 %-100 %] 99 % (10/01 0331) Weight:  [79.3 kg] 79.3 kg (10/01 0331)  Intake/Output from previous day: 09/30 0701 - 10/01 0700 In: 1333 [P.O.:1330; I.V.:3] Out: 500 [Urine:500]  General appearance: alert, cooperative, and no distress Heart: regular rate and rhythm and paced Lungs: diminished breath sounds bibasilar Abdomen: soft, non-tender; bowel sounds normal; no masses,  no organomegaly Extremities: edema trace Wound: clean and dry  Lab Results: Recent Labs    03/15/23 0324  WBC 7.3  HGB 8.3*  HCT 26.2*  PLT 313   BMET:  Recent Labs    03/15/23 0324 03/16/23 0352  NA 132* 131*  K 4.1 4.3  CL 93* 93*  CO2 24 28  GLUCOSE 148* 102*  BUN 48* 44*  CREATININE 1.74* 1.57*  CALCIUM 9.0 8.7*    PT/INR: No results for input(s): "LABPROT", "INR" in the last 72 hours. ABG    Component Value Date/Time   PHART 7.394 03/09/2023 1901   HCO3 22.2 03/09/2023 1901   TCO2 23 03/09/2023 1901   ACIDBASEDEF 2.0 03/09/2023 1901   O2SAT 100 03/09/2023 1901   CBG (last 3)  Recent Labs     03/16/23 1605 03/16/23 2113 03/17/23 0541  GLUCAP 112* 111* 116*    Assessment/Plan: S/P Procedure(s) (LRB): CORONARY ARTERY BYPASS GRAFTING (CABG) TIMES THREE USING THE LEFT INTERNAL MAMMARY ARTERY (LIMA) AND ENDOSCOPICALLY HARVESTED RIGHT GREATER SAPHENOUS VEIN (N/A) TRANSESOPHAGEAL ECHOCARDIOGRAM (N/A)  CV- NSR, BP improving- Midodrine will decrease to 5 mg BID Pulm- wean oxygen as tolerated, will get CXR to ensure post thoracentesis picture is free from pneumothorax, I suspect there is some residual pleural effusion Renal- weight is stable, not currently on Lasix.Marland Kitchen BMET not ordered will obtained and check creatinine level if continues to improve will resume short course of lasix DM- sugars stable Deconditioning- needs SNF placement Dispo- patient stable, checking CXR/BMET today.. if okay will resume lasix.Marland Kitchen SNF is approved and she has bed offer.. this is valid until tomorrow.. if CXR and BMET are okay I think she will be safe for discharge tomorrow   LOS: 13 days   Lowella Dandy, PA-C 03/17/2023   Chart reviewed, patient examined, agree with above. Wt is at admission wt but 7 lbs over preop wt. Creat down to 1.47. She still has some edema in legs so will resume diuretic for a few days. Her CXR looks improved after thoracentesis. Only 300 cc. I suspect she has some diaphragm elevation on the left. I think she can get  to SNF tomorrow if no changes.

## 2023-03-17 NOTE — Progress Notes (Signed)
Patient Name: Emily Farmer Date of Encounter: 03/17/2023 [facility] Cardiologist: Chrystie Nose, MD   Interval Summary  .    No significant complaints other than some chest pain related to her procedure.  Minimal shortness of breath and ambulating without significant difficulty.  Vital Signs .    Vitals:   03/16/23 1924 03/16/23 2330 03/17/23 0331 03/17/23 0800  BP: 121/62 130/77 124/67 126/60  Pulse: 83 80 80 88  Resp: 20 19 14    Temp: 98.1 F (36.7 C) 98 F (36.7 C) 98.6 F (37 C) 98.2 F (36.8 C)  TempSrc: Oral Oral Oral Oral  SpO2: 97% 100% 99%   Weight:   79.3 kg   Height:        Intake/Output Summary (Last 24 hours) at 03/17/2023 0912 Last data filed at 03/16/2023 1924 Gross per 24 hour  Intake 970 ml  Output 300 ml  Net 670 ml      03/17/2023    3:31 AM 03/16/2023    3:45 AM 03/15/2023    3:00 AM  Last 3 Weights  Weight (lbs) 174 lb 13.2 oz 174 lb 9.6 oz 173 lb 8 oz  Weight (kg) 79.3 kg 79.198 kg 78.7 kg      Telemetry/ECG    Paced heart rates in the 80s- Personally Reviewed  CV Studies     Physical Exam .   GEN: No acute distress.   Neck: + JVD Cardiac: RRR, no murmurs, rubs, or gallops.  Respiratory: Absent breath sounds on the left side GI: Soft, nontender, non-distended  MS: Mild edema  Patient Profile    Emily Farmer is a 67 y.o. 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  Assessment & Plan .      Multivessel CAD status post CABG x 3 (SVG to LAD, SVG to PL, SVG to PDA) Admitted for exertional chest pain and shortness of breath.  Mildly elevated troponins 19-29.  Underwent right and left heart catheterization that showed multivessel disease.  Continues to improve with minimal complaints.  Has  had some hypotension with discontinuation of some GDMT.  Requiring midodrine however this is being titrated back. Continue aspirin 325 mg daily, atorvastatin 40 mg, fenofibrate 160 mg. On midodrine 5 mg twice daily  Chronic HFpEF Pulmonary hypertension likely attributed by OSA This admission shows EF 60 to 65% with mildly reduced RV function severely elevated PASP.  Mild MR.  Moderate TR.  Right heart catheterization showed RA 19, PA 82/28, PCWP 36, PVR 2.9, CO 4.7, and CI 2.57. Seen by AHF team, s/p IV Lasix, repeat RHC 03/06/23 with RA 3, PA 47/11, PCWP 14 with prominent V, PVR 3.3, CO 4.21 and CI 2.37, indicating improved diuresis.  Still has volume on her and needs diuresis. Agree with resuming Lasix pending BMP today. GDMT has been limited by hypotension.  No SGLT2 inhibitor with recent UTI, add back spironolactone once renal function/BP stabilizes.  L pleural effusion Underwent thoracentesis yesterday.  Likely still has some residual fluid given absent left-sided lung sounds.  Permanent atrial fibrillation status post AV nodal ablation Medtronic PPM Interrogation during this admission showed normal function and 100% V pacing with no other abnormalities. Resume Eliquis when okay by CT surgery PTA was on diltiazem 120 mg Toprol-XL 100  mg daily.  This has been stopped due to hypotension.  AKI on CKD 3 I Downtrending has been labile due to diuretics which were held after right heart cath on 9/20 due to low RA pressure and hypotension with worsening renal function and improved volume status.  1.57 yesterday.  Peaked around 1.7  Hyperlipidemia 32 this admission and well-controlled.  Continue statin above.  No new labs today.     For questions or updates, please contact [facility] Please consult [email or web address] for contact info under        Signed, Abagail Kitchens, PA-C

## 2023-03-17 NOTE — Progress Notes (Signed)
Upon medication administration with patient at 1000 on 03/17/23, patient refused 30 units of Levemir ordered for 1000. Pt stated that she self-administers long acting insulin at home at night time, and would like to change her long-acting insulin to before bed while in-patient. This Student nurse contacted unit pharmacy to communicate this request.Unit pharmacy concurred. Orders adjusted as requested.

## 2023-03-18 DIAGNOSIS — I5032 Chronic diastolic (congestive) heart failure: Secondary | ICD-10-CM | POA: Diagnosis not present

## 2023-03-18 DIAGNOSIS — M6259 Muscle wasting and atrophy, not elsewhere classified, multiple sites: Secondary | ICD-10-CM | POA: Diagnosis not present

## 2023-03-18 DIAGNOSIS — E1142 Type 2 diabetes mellitus with diabetic polyneuropathy: Secondary | ICD-10-CM | POA: Diagnosis not present

## 2023-03-18 DIAGNOSIS — Z741 Need for assistance with personal care: Secondary | ICD-10-CM | POA: Diagnosis not present

## 2023-03-18 DIAGNOSIS — M6281 Muscle weakness (generalized): Secondary | ICD-10-CM | POA: Diagnosis not present

## 2023-03-18 DIAGNOSIS — E785 Hyperlipidemia, unspecified: Secondary | ICD-10-CM | POA: Diagnosis not present

## 2023-03-18 DIAGNOSIS — R2681 Unsteadiness on feet: Secondary | ICD-10-CM | POA: Diagnosis not present

## 2023-03-18 DIAGNOSIS — Z7189 Other specified counseling: Secondary | ICD-10-CM | POA: Diagnosis not present

## 2023-03-18 DIAGNOSIS — E1169 Type 2 diabetes mellitus with other specified complication: Secondary | ICD-10-CM | POA: Diagnosis not present

## 2023-03-18 DIAGNOSIS — I25110 Atherosclerotic heart disease of native coronary artery with unstable angina pectoris: Secondary | ICD-10-CM | POA: Diagnosis not present

## 2023-03-18 DIAGNOSIS — Z951 Presence of aortocoronary bypass graft: Secondary | ICD-10-CM | POA: Diagnosis not present

## 2023-03-18 DIAGNOSIS — I4821 Permanent atrial fibrillation: Secondary | ICD-10-CM | POA: Diagnosis not present

## 2023-03-18 DIAGNOSIS — R531 Weakness: Secondary | ICD-10-CM | POA: Diagnosis not present

## 2023-03-18 DIAGNOSIS — I480 Paroxysmal atrial fibrillation: Secondary | ICD-10-CM | POA: Diagnosis not present

## 2023-03-18 DIAGNOSIS — I10 Essential (primary) hypertension: Secondary | ICD-10-CM | POA: Diagnosis not present

## 2023-03-18 DIAGNOSIS — R41841 Cognitive communication deficit: Secondary | ICD-10-CM | POA: Diagnosis not present

## 2023-03-18 DIAGNOSIS — Z7401 Bed confinement status: Secondary | ICD-10-CM | POA: Diagnosis not present

## 2023-03-18 DIAGNOSIS — N1832 Chronic kidney disease, stage 3b: Secondary | ICD-10-CM | POA: Diagnosis not present

## 2023-03-18 DIAGNOSIS — E113593 Type 2 diabetes mellitus with proliferative diabetic retinopathy without macular edema, bilateral: Secondary | ICD-10-CM | POA: Diagnosis not present

## 2023-03-18 LAB — GLUCOSE, CAPILLARY
Glucose-Capillary: 112 mg/dL — ABNORMAL HIGH (ref 70–99)
Glucose-Capillary: 254 mg/dL — ABNORMAL HIGH (ref 70–99)

## 2023-03-18 MED ORDER — MIDODRINE HCL 5 MG PO TABS: 5.0000 mg | ORAL_TABLET | Freq: Two times a day (BID) | ORAL | Status: DC

## 2023-03-18 MED ORDER — TORSEMIDE 20 MG PO TABS: 20.0000 mg | ORAL_TABLET | Freq: Every day | ORAL | Status: DC

## 2023-03-18 MED ORDER — POTASSIUM CHLORIDE CRYS ER 20 MEQ PO TBCR: 20.0000 meq | EXTENDED_RELEASE_TABLET | Freq: Every day | ORAL | Status: DC

## 2023-03-18 MED ORDER — ASPIRIN 81 MG PO TBEC: 81.0000 mg | DELAYED_RELEASE_TABLET | Freq: Every day | ORAL | Status: DC

## 2023-03-18 MED ORDER — OXYCODONE HCL 5 MG PO TABS: 5.0000 mg | ORAL_TABLET | ORAL | 0 refills | Status: DC | PRN

## 2023-03-18 MED FILL — Albumin, Human Inj 5%: INTRAVENOUS | Qty: 250 | Status: AC

## 2023-03-18 MED FILL — Sodium Chloride IV Soln 0.9%: INTRAVENOUS | Qty: 2000 | Status: AC

## 2023-03-18 MED FILL — Lidocaine HCl Local Soln Prefilled Syringe 100 MG/5ML (2%): INTRAMUSCULAR | Qty: 5 | Status: AC

## 2023-03-18 MED FILL — Mannitol IV Soln 20%: INTRAVENOUS | Qty: 500 | Status: AC

## 2023-03-18 MED FILL — Heparin Sodium (Porcine) Inj 1000 Unit/ML: INTRAMUSCULAR | Qty: 20 | Status: AC

## 2023-03-18 MED FILL — Electrolyte-R (PH 7.4) Solution: INTRAVENOUS | Qty: 3000 | Status: AC

## 2023-03-18 MED FILL — Sodium Bicarbonate IV Soln 8.4%: INTRAVENOUS | Qty: 50 | Status: AC

## 2023-03-18 NOTE — TOC Transition Note (Signed)
Transition of Care [facility]) - CM/SW Discharge Note   Patient Details  Name: Emily Farmer MRN: [id number] Date of Birth: 1956/04/29  Transition of Care [facility]) CM/SW Contact:  Eduard Roux, LCSW Phone Number: 03/18/2023, 12:33 PM   Clinical Narrative:     Patient will Discharge to: [facility] Discharge Date: 03/18/2023 Family Notified: unable to reach family by phone Transport By: Sharin Mons  Per MD patient is ready for discharge. RN, patient, and facility notified of discharge. Discharge Summary sent to facility. RN given number for [phone]. Ambulance transport requested for patient.   Clinical Social Worker signing off.  Antony Blackbird, MSW, LCSW Clinical Social Worker     Final next level of care: Skilled Nursing Facility Barriers to Discharge: Barriers Resolved   Patient Goals and CMS Choice      Discharge Placement                Patient chooses bed at: [facility] Patient to be transferred to facility by: [facility] Name of family member notified: unable to reach family Patient and family notified of of transfer: 03/18/23  Discharge Plan and Services Additional resources added to the After Visit Summary for     Discharge Planning Services: CM Consult Post Acute Care Choice: IP Rehab                               Social Determinants of Health (SDOH) Interventions SDOH Screenings   Food Insecurity: No Food Insecurity (03/02/2023)  Housing: Low Risk  (03/02/2023)  Transportation Needs: No Transportation Needs (03/02/2023)  Utilities: Not At Risk (03/02/2023)  Alcohol Screen: Low Risk  (09/15/2022)  Depression (PHQ2-9): Low Risk  (11/18/2022)  Financial Resource Strain: Medium Risk (09/15/2022)  Physical Activity: Insufficiently Active (09/15/2022)  Social Connections: Socially Isolated (09/15/2022)  Stress: Stress Concern Present (07/21/2022)  Tobacco Use: Low Risk  (03/01/2023)     Readmission Risk Interventions    04/03/2021    2:32  PM  Readmission Risk Prevention Plan  Transportation Screening Complete  PCP or Specialist Appt within 5-7 Days Complete  Home Care Screening Complete  Medication Review (RN CM) Complete

## 2023-03-18 NOTE — Progress Notes (Addendum)
Patient Name: Emily Farmer Date of Encounter: 03/18/2023 [facility] Cardiologist: Chrystie Nose, MD   Interval Summary  .    No significant complaints other than some chest pain related to her procedure.  No changes. DC today it seems.   Vital Signs .    Vitals:   03/18/23 0100 03/18/23 0349 03/18/23 0728 03/18/23 0752  BP:  102/73  125/80  Pulse: 80 81 87 82  Resp: 17 15 18 13   Temp:  97.8 F (36.6 C)  97.7 F (36.5 C)  TempSrc:  Oral  Oral  SpO2: 93% 91% 92% 95%  Weight:  78.8 kg    Height:        Intake/Output Summary (Last 24 hours) at 03/18/2023 0829 Last data filed at 03/18/2023 0100 Gross per 24 hour  Intake 500 ml  Output --  Net 500 ml      03/18/2023    3:49 AM 03/17/2023    3:31 AM 03/16/2023    3:45 AM  Last 3 Weights  Weight (lbs) 173 lb 11.2 oz 174 lb 13.2 oz 174 lb 9.6 oz  Weight (kg) 78.79 kg 79.3 kg 79.198 kg      Telemetry/ECG    Paced heart rates in the 80s- Personally Reviewed  CV Studies     Physical Exam .   GEN: No acute distress.   Neck: + JVD Cardiac: RRR, no murmurs, rubs, or gallops.  Respiratory: diminished breath sounds on the left side GI: Soft, nontender, non-distended  MS: Mild edema  Patient Profile    Emily Farmer is a 67 y.o. 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  Assessment & Plan .      Multivessel CAD status post CABG x 3 (SVG to LAD, SVG to PL, SVG to PDA) Admitted for exertional chest pain and shortness of breath.  Mildly elevated troponins 19-29.  Underwent right and left heart catheterization that showed multivessel disease.  Continues to improve with minimal complaints.  Has had some hypotension with discontinuation of some GDMT.  Requiring midodrine however  this is being titrated back. Continue aspirin 325 mg daily, atorvastatin 40 mg, fenofibrate 160 mg. On midodrine 5 mg twice daily  Chronic HFpEF Pulmonary hypertension likely attributed by OSA This admission shows EF 60 to 65% with mildly reduced RV function severely elevated PASP.  Mild MR.  Moderate TR.  Right heart catheterization showed RA 19, PA 82/28, PCWP 36, PVR 2.9, CO 4.7, and CI 2.57. Seen by AHF team, s/p IV Lasix, repeat RHC 03/06/23 with RA 3, PA 47/11, PCWP 14 with prominent V, PVR 3.3, CO 4.21 and CI 2.37, indicating improved diuresis.  Still has volume on her and needs diuresis. Continue torsemide 20mg  GDMT has been limited by hypotension.  No SGLT2 inhibitor with recent UTI, add back spironolactone once renal function/BP stabilizes.  L pleural effusion Underwent thoracentesis 09/30.  No SOB  Permanent atrial fibrillation status post AV nodal ablation [employer] PPM Interrogation during this admission showed normal function and 100% V pacing with no other abnormalities. Continue eliquis 5mg  BID PTA was on diltiazem 120 mg Toprol-XL 100 mg daily.  This has been stopped due to hypotension.  AKI on CKD 3 I Downtrending has been labile due to diuretics which were  held after right heart cath on 9/20 due to low RA pressure and hypotension with worsening renal function and improved volume status.  1.57>1.48.  Peaked around 1.7  Hyperlipidemia 32 this admission and well-controlled.  Continue statin above.  No new labs today.  But renal function continues to improve. Follow up has been arranged     For questions or updates, please contact [facility] Please consult [email or web address] for contact info under        Signed, Abagail Kitchens, PA-C

## 2023-03-18 NOTE — Progress Notes (Signed)
Discussed with pt IS, sternal precautions, diet including low sodium, exercise, and CRPII. Pt receptive. Will refer to [place] CRPII.  [id number] Ethelda Chick BS, ACSM-CEP 03/18/2023 10:37 AM

## 2023-03-18 NOTE — Progress Notes (Addendum)
      [address]       [place] [address]             [phone]       9 Days Post-Op Procedure(s) (LRB): CORONARY ARTERY BYPASS GRAFTING (CABG) TIMES THREE USING THE LEFT INTERNAL MAMMARY ARTERY (LIMA) AND ENDOSCOPICALLY HARVESTED RIGHT GREATER SAPHENOUS VEIN (N/A) TRANSESOPHAGEAL ECHOCARDIOGRAM (N/A)  Subjective:  Wanting to sit up and eat breakfast.  Overall is breathing better.  Wants her sister notified about SNF placement  Objective: Vital signs in last 24 hours: Temp:  [97.8 F (36.6 C)-98.2 F (36.8 C)] 97.8 F (36.6 C) (10/02 0349) Pulse Rate:  [80-88] 87 (10/02 0728) Cardiac Rhythm: Ventricular paced (10/02 0311) Resp:  [14-22] 18 (10/02 0728) BP: (99-126)/(45-73) 102/73 (10/02 0349) SpO2:  [91 %-99 %] 92 % (10/02 0728) Weight:  [78.8 kg] 78.8 kg (10/02 0349)  Intake/Output from previous day: 10/01 0701 - 10/02 0700 In: 500 [P.O.:500] Out: -   General appearance: alert, cooperative, and no distress Heart: regular rate and rhythm and paced Lungs: clear to auscultation bilaterally Abdomen: soft, non-tender; bowel sounds normal; no masses,  no organomegaly Extremities: edema trace Wound: clean and dry  Lab Results: No results for input(s): "WBC", "HGB", "HCT", "PLT" in the last 72 hours. BMET:  Recent Labs    03/16/23 0352 03/17/23 0840  NA 131* 133*  K 4.3 4.1  CL 93* 94*  CO2 28 26  GLUCOSE 102* 183*  BUN 44* 42*  CREATININE 1.57* 1.48*  CALCIUM 8.7* 9.0    PT/INR: No results for input(s): "LABPROT", "INR" in the last 72 hours. ABG    Component Value Date/Time   PHART 7.394 03/09/2023 1901   HCO3 22.2 03/09/2023 1901   TCO2 23 03/09/2023 1901   ACIDBASEDEF 2.0 03/09/2023 1901   O2SAT 100 03/09/2023 1901   CBG (last 3)  Recent Labs    03/17/23 1524 03/17/23 2057 03/18/23 0603  GLUCAP 71 235* 112*    Assessment/Plan: S/P Procedure(s) (LRB): CORONARY ARTERY BYPASS GRAFTING (CABG) TIMES THREE USING THE LEFT  INTERNAL MAMMARY ARTERY (LIMA) AND ENDOSCOPICALLY HARVESTED RIGHT GREATER SAPHENOUS VEIN (N/A) TRANSESOPHAGEAL ECHOCARDIOGRAM (N/A)  CV- NSR, BP okay, low at times- continue Midodrine 5 mg BID Pulm- weaned off oxygen, CXR with trace residual pleural effusion continue IS Renal- creatinine was improved, will give a few days of diuretics, potassium DM- sugars remain stable  Deconditioning- SNF placement DIspo- patient stable, will d/c to SNF today   LOS: 14 days    Lowella Dandy, PA-C 03/18/2023   Chart reviewed, patient examined, agree with above. She looks good. I think she can go to SNF today.

## 2023-03-18 NOTE — Care Management Important Message (Signed)
Important Message  Patient Details  Name: Emily Farmer MRN: [id number] Date of Birth: 1955-08-08   Important Message Given:  Yes - Medicare IM     Sherilyn Banker 03/18/2023, 1:18 PM

## 2023-03-20 ENCOUNTER — Telehealth (HOSPITAL_COMMUNITY): Payer: Self-pay

## 2023-03-20 ENCOUNTER — Ambulatory Visit: Payer: Medicare HMO | Admitting: Internal Medicine

## 2023-03-20 NOTE — Telephone Encounter (Signed)
Pt insurance is active and benefits verified through [employer] [employer] [facility]. Co-pay $0.00, DED $240.00/$240.00 met, out of pocket $8,850.00/$3,454.43 met, co-insurance 20%. No pre-authorization required. Passport, 03/20/23 @ 11:52AM, REF#[id number]   How many CR sessions are covered? (36 visits for TCR, 72 visits for ICR)72 Is this a lifetime maximum or an annual maximum? Lifetime Has the member used any of these services to date? No Is there a time limit (weeks/months) on start of program and/or program completion? No   2ndary insurance is active and benefits verified through Medicaid. Co-pay $0.00, DED $0.00/$0.00 met, out of pocket $0.00/$0.00 met, co-insurance 0%. No pre-authorization required. Passport, 03/20/23 @ 12:01PM, REF#[id number]      Will contact patient to see if she is interested in the Cardiac Rehab Program. If interested, patient will need to complete follow up appt. Once completed, patient will be contacted for scheduling upon review by the RN Navigator.

## 2023-03-20 NOTE — Telephone Encounter (Signed)
Called patient to see if she is interested in the Cardiac Rehab Program. Patient expressed interest. Explained scheduling process and went over insurance, patient verbalized understanding. Will contact patient for scheduling once f/u has been completed. 

## 2023-03-25 DIAGNOSIS — M6281 Muscle weakness (generalized): Secondary | ICD-10-CM | POA: Diagnosis not present

## 2023-03-25 DIAGNOSIS — I480 Paroxysmal atrial fibrillation: Secondary | ICD-10-CM | POA: Diagnosis not present

## 2023-03-25 DIAGNOSIS — I10 Essential (primary) hypertension: Secondary | ICD-10-CM | POA: Diagnosis not present

## 2023-03-25 DIAGNOSIS — E1169 Type 2 diabetes mellitus with other specified complication: Secondary | ICD-10-CM | POA: Diagnosis not present

## 2023-03-25 DIAGNOSIS — I25110 Atherosclerotic heart disease of native coronary artery with unstable angina pectoris: Secondary | ICD-10-CM | POA: Diagnosis not present

## 2023-03-25 DIAGNOSIS — E785 Hyperlipidemia, unspecified: Secondary | ICD-10-CM | POA: Diagnosis not present

## 2023-03-25 DIAGNOSIS — I5032 Chronic diastolic (congestive) heart failure: Secondary | ICD-10-CM | POA: Diagnosis not present

## 2023-03-25 DIAGNOSIS — R2681 Unsteadiness on feet: Secondary | ICD-10-CM | POA: Diagnosis not present

## 2023-03-25 DIAGNOSIS — Z741 Need for assistance with personal care: Secondary | ICD-10-CM | POA: Diagnosis not present

## 2023-03-26 DIAGNOSIS — I25110 Atherosclerotic heart disease of native coronary artery with unstable angina pectoris: Secondary | ICD-10-CM | POA: Diagnosis not present

## 2023-03-26 DIAGNOSIS — Z7189 Other specified counseling: Secondary | ICD-10-CM | POA: Diagnosis not present

## 2023-03-26 NOTE — Progress Notes (Deleted)
Cardiology Office Note:  .   Date:  03/26/2023  ID:  Emily Farmer, DOB 04-Apr-1956, MRN [id number] PCP: Marcine Matar, MD  [facility] Providers Cardiologist:  Chrystie Nose, MD Electrophysiologist:  Lanier Prude, MD    History of Present Illness: .   Emily Farmer is a 67 y.o. female with a past medical history of diastolic heart failure, CAD s/p CABG, permanent A-fib s/p AV node ablation and single-chamber PPM in 12/2022, type 2 DM on insulin, HLD. Patient is followed by Dr. Rennis Golden and Dr. Lalla Brothers. Presents today for a follow up appointment after CABG.   Per chart review, patient has a history of permanent atrial fibrillation. Underwent AV node ablation and implantation of a single-chamber permanent pacemaker on 01/02/23. About 2 months later, patient presented to [facility] complaining of chest heaviness and shortness of breath on exertion. She underwent echocardiogram on 03/02/23 that showed EF 60-65%, no wall motion abnormalities, mildly reduced RV function, severely elevated pulmonary artery pressure, mild MR. Underwent R/L heart catheterization on 03/03/23 that showed multivessel CAD and hemodynamic findings consistent with severe pulmonary hypertension.  Patient was seen by Advanced heart failure who recommended diuresis, SGLT2 inhibitor, and CABG.  Pre-CABG carotid ultrasounds showed 1-39% stenosis bilaterally.Underwent 3V CABG with SVG-LAD, SVG-PL, and SVG-PDA. After surgery, patient became hypotensive requiring midodrine. Patient was discharged on eliquis 5 mg BID, ASA 81 mg daily, lipitor 40 mg daily, midodrine 5 mg BID, torsemide 20 m daily, potassium supplementation.   CAD S/P CABG  - Patient recently underwent 3V CABG with SVG to LAD, SVG to PL, SVG to PDA -  - Continue ASA 81 mg daily  - Continue lipitor 40 mg daily  - Not on BB due to low BP   Chronic Diastolic Heart Failure  Severe pulmonary hypertension -Most recent echocardiogram from 03/02/2023  showed EF 60-65%, no regional wall motion abnormalities, mildly reduced RV function, severely elevated pulmonary artery systolic pressure.  After diuresis, right heart catheterization on 03/06/2023 showed mild primarily pulmonary arterial hypertension - Currently on torsemide 20 mg daily  - BMP  - SGLT2i   Hypotension  - After CABG, patient became hypotensive and was discharged on midodrine 5 mg BID  -  Permanent Afib s/p AV nodal ablation and PPM  - Underwent AV nodal ablation and PPM implantation on 01/02/23 - Device followed by Dr. Ladona Ridgel  - Continue eliquis 5 mg BID   HLD  - Lipid panel from 02/2023 showed LDL 32  - Continue lipitor 40 mg daily   ROS: ***  Studies Reviewed: .        *** Risk Assessment/Calculations:   {Does this patient have ATRIAL FIBRILLATION?:[id number]} No BP recorded.  {Refresh Note OR Click here to enter BP  :1}***       Physical Exam:   VS:  There were no vitals taken for this visit.   Wt Readings from Last 3 Encounters:  03/18/23 173 lb 11.2 oz (78.8 kg)  02/02/23 171 lb 6.4 oz (77.7 kg)  01/02/23 151 lb (68.5 kg)    GEN: Well nourished, well developed in no acute distress NECK: No JVD; No carotid bruits CARDIAC: ***RRR, no murmurs, rubs, gallops RESPIRATORY:  Clear to auscultation without rales, wheezing or rhonchi  ABDOMEN: Soft, non-tender, non-distended EXTREMITIES:  No edema; No deformity   ASSESSMENT AND PLAN: .   *** {The patient has an active order for outpatient cardiac rehabilitation.   Please indicate if the patient is ready to start.  Do NOT delete this.  It will auto delete.  Refresh note, then sign.              Click here to document readiness and see contraindications.  :1}  Cardiac Rehabilitation Eligibility Assessment      {Are you ordering a CV Procedure (e.g. stress test, cath, DCCV, TEE, etc)?   Press F2        :[id number]}  Dispo: ***  Signed, Jonita Albee, PA-C

## 2023-03-27 DIAGNOSIS — N1832 Chronic kidney disease, stage 3b: Secondary | ICD-10-CM | POA: Diagnosis not present

## 2023-03-27 DIAGNOSIS — I25110 Atherosclerotic heart disease of native coronary artery with unstable angina pectoris: Secondary | ICD-10-CM | POA: Diagnosis not present

## 2023-03-27 DIAGNOSIS — Z951 Presence of aortocoronary bypass graft: Secondary | ICD-10-CM | POA: Diagnosis not present

## 2023-03-27 DIAGNOSIS — I5032 Chronic diastolic (congestive) heart failure: Secondary | ICD-10-CM | POA: Diagnosis not present

## 2023-03-28 ENCOUNTER — Other Ambulatory Visit: Payer: Self-pay

## 2023-03-30 ENCOUNTER — Other Ambulatory Visit: Payer: Self-pay

## 2023-03-30 ENCOUNTER — Ambulatory Visit: Payer: Medicare HMO | Admitting: Cardiology

## 2023-03-30 MED ORDER — OXYCODONE HCL 5 MG PO TABS
5.0000 mg | ORAL_TABLET | ORAL | 0 refills | Status: DC | PRN
  Filled 2023-03-30: qty 42, 7d supply, fill #0

## 2023-04-01 ENCOUNTER — Telehealth: Payer: Self-pay | Admitting: *Deleted

## 2023-04-01 DIAGNOSIS — I130 Hypertensive heart and chronic kidney disease with heart failure and stage 1 through stage 4 chronic kidney disease, or unspecified chronic kidney disease: Secondary | ICD-10-CM | POA: Diagnosis not present

## 2023-04-01 DIAGNOSIS — I2510 Atherosclerotic heart disease of native coronary artery without angina pectoris: Secondary | ICD-10-CM | POA: Diagnosis not present

## 2023-04-01 DIAGNOSIS — I480 Paroxysmal atrial fibrillation: Secondary | ICD-10-CM | POA: Diagnosis not present

## 2023-04-01 DIAGNOSIS — I5032 Chronic diastolic (congestive) heart failure: Secondary | ICD-10-CM | POA: Diagnosis not present

## 2023-04-01 DIAGNOSIS — N1832 Chronic kidney disease, stage 3b: Secondary | ICD-10-CM | POA: Diagnosis not present

## 2023-04-01 DIAGNOSIS — I495 Sick sinus syndrome: Secondary | ICD-10-CM | POA: Diagnosis not present

## 2023-04-01 DIAGNOSIS — Z48812 Encounter for surgical aftercare following surgery on the circulatory system: Secondary | ICD-10-CM | POA: Diagnosis not present

## 2023-04-01 DIAGNOSIS — I051 Rheumatic mitral insufficiency: Secondary | ICD-10-CM | POA: Diagnosis not present

## 2023-04-01 DIAGNOSIS — E1122 Type 2 diabetes mellitus with diabetic chronic kidney disease: Secondary | ICD-10-CM | POA: Diagnosis not present

## 2023-04-01 NOTE — Telephone Encounter (Signed)
Patient contacted the office stating her right leg was swollen and requested a refill of Demadex and Potassium. Per patient, leg is swollen from right knee to right foot. Denies pain or redness. Denies SOB. States she does not have batteries in her scale so she is not aware of a recent weight. Patient states she is elevating her legs at rest. Reviewed symptoms with Gaynelle Arabian, PA. Left patient message advising her to keep leg elevated while at rest and to wear compression socks to help with swelling. Provided patient with call back number for any further questions or concerns.

## 2023-04-06 ENCOUNTER — Encounter: Payer: Self-pay | Admitting: Internal Medicine

## 2023-04-06 ENCOUNTER — Ambulatory Visit: Payer: Medicare HMO | Attending: Internal Medicine | Admitting: Internal Medicine

## 2023-04-06 VITALS — BP 110/66 | HR 84 | Ht 63.0 in | Wt 177.4 lb

## 2023-04-06 DIAGNOSIS — I4819 Other persistent atrial fibrillation: Secondary | ICD-10-CM | POA: Diagnosis not present

## 2023-04-06 DIAGNOSIS — I442 Atrioventricular block, complete: Secondary | ICD-10-CM

## 2023-04-06 NOTE — Progress Notes (Signed)
HPI [name] returns today for ongoing evaluation of atrial fib with tachy-brady syndrome. She is a pleasant 67 yo woman with uncontrolled atrial fib and is s/p VVI PM insertion about 9 months ago. She has been on both beta blocker and digoxin but continues to have RVR. She has not yet taken cardizem. She denies chest pain but she quickly gets out of breath with any walking.  Allergies  Allergen Reactions   Trulicity [Dulaglutide] Diarrhea     Current Outpatient Medications  Medication Sig Dispense Refill   Accu-Chek Softclix Lancets lancets Use to check blood sugar 3 times daily. 100 each 3   acetaminophen (TYLENOL) 325 MG tablet Take 2 tablets (650 mg total) by mouth every 6 (six) hours as needed for mild pain (or Fever >/= 101).     apixaban (ELIQUIS) 5 MG TABS tablet Take 1 tablet (5 mg total) by mouth in the morning and at bedtime. 180 tablet 1   aspirin EC 81 MG tablet Take 1 tablet (81 mg total) by mouth daily. Swallow whole.     atorvastatin (LIPITOR) 40 MG tablet Take 1 tablet (40 mg total) by mouth daily. 90 tablet 3   Blood Glucose Monitoring Suppl (ACCU-CHEK GUIDE) w/Device KIT Use to check blood sugar 3 times daily. 1 kit 0   Blood Pressure Monitoring (BLOOD PRESSURE MONITOR/ARM) DEVI Check blood pressure once per day at least 2 hours after medications. 1 each 0   ciclopirox (PENLAC) 8 % solution Apply topically at bedtime. Apply over nail and surrounding skin. Apply daily over previous coat. After seven (7) days, may remove with alcohol and continue cycle. 6.6 mL 0   Continuous Blood Gluc Sensor (DEXCOM G7 SENSOR) MISC 1 Device by Does not apply route as directed. 9 each 3   diclofenac Sodium (VOLTAREN) 1 % GEL Apply 2 g topically 4 (four) times daily. Apply across lower back for back pain (Patient taking differently: Apply 2 g topically 4 (four) times daily as needed (pain). Apply across lower back for back pain) 100 g 0   fenofibrate (TRICOR) 48 MG tablet Take 1 tablet  (48 mg total) by mouth daily. 90 tablet 1   glucose blood (ACCU-CHEK GUIDE) test strip Use to check blood sugar 3 times daily. 100 each 2   insulin glargine (LANTUS SOLOSTAR) 100 UNIT/ML Solostar Pen Inject 55 Units into the skin daily. (Patient taking differently: Inject 10 Units into the skin daily.) 45 mL 3   insulin lispro (HUMALOG KWIKPEN) 100 UNIT/ML KwikPen Max daily 45 units (Patient taking differently: Inject 5 Units into the skin with breakfast, with lunch, and with evening meal.) 45 mL 2   Insulin Pen Needle 32G X 4 MM MISC use in the morning, at noon, in the evening, and at bedtime. 400 each 3   Insulin Syringe-Needle U-100 (TRUEPLUS INSULIN SYRINGE) 31G X 5/16" 0.3 ML MISC Use to inject insulin daily. (Patient taking differently: 1 each by Other route See admin instructions. Use to inject insulin daily.) 100 each 11   liraglutide (VICTOZA) 18 MG/3ML SOPN Inject 1.8 mg into the skin daily. 27 mL 3   midodrine (PROAMATINE) 5 MG tablet Take 1 tablet (5 mg total) by mouth 2 (two) times daily with a meal.     Multiple Vitamins-Minerals (CENTRUM SILVER PO) Take 1 tablet by mouth daily.     Multiple Vitamins-Minerals (OCUVITE ADULT 50+) CAPS Take 1 capsule by mouth daily.     oxyCODONE (OXY IR/ROXICODONE) 5 MG  immediate release tablet Take 1 tablet (5 mg total) by mouth every 4 (four) hours as needed for severe pain. 30 tablet 0   oxyCODONE (OXY IR/ROXICODONE) 5 MG immediate release tablet Take 1 tablet (5 mg total) by mouth every 4 (four) hours as needed for severe pain 42 tablet 0   pioglitazone (ACTOS) 15 MG tablet Take 1 tablet (15 mg total) by mouth daily. 90 tablet 3   potassium chloride SA (KLOR-CON M) 20 MEQ tablet Take 1 tablet (20 mEq total) by mouth daily.     torsemide (DEMADEX) 20 MG tablet Take 1 tablet (20 mg total) by mouth daily.     ferrous sulfate 325 (65 FE) MG tablet Take 1 tablet (325 mg total) by mouth daily with breakfast. 30 tablet 4   No current facility-administered  medications for this visit.     Past Medical History:  Diagnosis Date   Arthritis    Atrial fibrillation (HCC)    a. 09/2016 in setting of pancreatitis;  b. 09/2016 Echo: EF 65-60%, no rwma, Gr2 DD, mild MR, triv TR, PASP ;  CHA2DS2VASc = 4-->Eliquis 5mg  BID. c. Recurred 06/2018 in setting of GIB.   Chronic diastolic CHF (congestive heart failure) (HCC) 01/28/2018   Diabetes mellitus    GI bleeding 06/2018   Gout    Hyperlipidemia    Hypertension    Hypertriglyceridemia    Pancreatitis    a. 09/2016 - Triglycerides 1,392 on admission.    ROS:   All systems reviewed and negative except as noted in the HPI.   Past Surgical History:  Procedure Laterality Date   AV NODE ABLATION N/A 01/02/2023   Procedure: AV NODE ABLATION;  Surgeon: Marinus Maw, MD;  Location: [facility] INVASIVE CV LAB;  Service: Cardiovascular;  Laterality: N/A;   BIOPSY  06/20/2018   Procedure: BIOPSY;  Surgeon: Vida Rigger, MD;  Location: [facility] ENDOSCOPY;  Service: Endoscopy;;   CHOLECYSTECTOMY N/A 01/04/2014   Procedure: LAPAROSCOPIC CHOLECYSTECTOMY WITH INTRAOPERATIVE CHOLANGIOGRAM;  Surgeon: Axel Filler, MD;  Location: [facility] OR;  Service: General;  Laterality: N/A;   COLONOSCOPY WITH PROPOFOL N/A 06/21/2018   Procedure: COLONOSCOPY WITH PROPOFOL;  Surgeon: Bernette Redbird, MD;  Location: [facility] ENDOSCOPY;  Service: Endoscopy;  Laterality: N/A;   CORONARY ARTERY BYPASS GRAFT N/A 03/09/2023   Procedure: CORONARY ARTERY BYPASS GRAFTING (CABG) TIMES THREE USING THE LEFT INTERNAL MAMMARY ARTERY (LIMA) AND ENDOSCOPICALLY HARVESTED RIGHT GREATER SAPHENOUS VEIN;  Surgeon: [name], MD;  Location: [facility] OR;  Service: Open Heart Surgery;  Laterality: N/A;   ESOPHAGOGASTRODUODENOSCOPY (EGD) WITH PROPOFOL N/A 06/20/2018   Procedure: ESOPHAGOGASTRODUODENOSCOPY (EGD) WITH PROPOFOL;  Surgeon: Vida Rigger, MD;  Location: [facility] ENDOSCOPY;  Service: Endoscopy;  Laterality: N/A;   IR THORACENTESIS ASP PLEURAL SPACE W/IMG GUIDE  03/16/2023    LUNG SURGERY     PACEMAKER IMPLANT N/A 03/03/2022   Procedure: PACEMAKER IMPLANT;  Surgeon: Marinus Maw, MD;  Location: [facility] INVASIVE CV LAB;  Service: Cardiovascular;  Laterality: N/A;   RIGHT HEART CATH N/A 03/06/2023   Procedure: RIGHT HEART CATH;  Surgeon: Laurey Morale, MD;  Location: [facility] INVASIVE CV LAB;  Service: Cardiovascular;  Laterality: N/A;   RIGHT/LEFT HEART CATH AND CORONARY ANGIOGRAPHY N/A 03/03/2023   Procedure: RIGHT/LEFT HEART CATH AND CORONARY ANGIOGRAPHY;  Surgeon: Marykay Lex, MD;  Location: [facility] INVASIVE CV LAB;  Service: Cardiovascular;  Laterality: N/A;   TEE WITHOUT CARDIOVERSION N/A 03/09/2023   Procedure: TRANSESOPHAGEAL ECHOCARDIOGRAM;  Surgeon: [name], MD;  Location: [facility]  OR;  Service: Open Heart Surgery;  Laterality: N/A;     Family History  Problem Relation Age of Onset   Heart attack Mother    Heart attack Father    Diabetes Sister    High blood pressure Neg Hx    High Cholesterol Neg Hx      Social History   Socioeconomic History   Marital status: Single    Spouse name: Not on file   Number of children: Not on file   Years of education: Not on file   Highest education level: Not on file  Occupational History   Not on file  Tobacco Use   Smoking status: Never   Smokeless tobacco: Never  Vaping Use   Vaping status: Never Used  Substance and Sexual Activity   Alcohol use: No   Drug use: No   Sexual activity: Not Currently  Other Topics Concern   Not on file  Social History Narrative   Lives with her sister and does not need any assistance with ADLs.     Social Determinants of Health   Financial Resource Strain: Medium Risk (09/15/2022)   Overall Financial Resource Strain (CARDIA)    Difficulty of Paying Living Expenses: Somewhat hard  Food Insecurity: No Food Insecurity (03/02/2023)   Hunger Vital Sign    Worried About Running Out of Food in the Last Year: Never true    Ran Out of Food in the Last Year: Never true   Transportation Needs: No Transportation Needs (03/02/2023)   PRAPARE - Administrator, Civil Service (Medical): No    Lack of Transportation (Non-Medical): No  Physical Activity: Insufficiently Active (09/15/2022)   Exercise Vital Sign    Days of Exercise per Week: 4 days    Minutes of Exercise per Session: 10 min  Stress: Stress Concern Present (07/21/2022)   [employer] of Occupational Health - Occupational Stress Questionnaire    Feeling of Stress : Very much  Social Connections: Socially Isolated (09/15/2022)   Social Connection and Isolation Panel [NHANES]    Frequency of Communication with Friends and Family: Once a week    Frequency of Social Gatherings with Friends and Family: More than three times a week    Attends Religious Services: Never    Database administrator or Organizations: No    Attends Banker Meetings: Never    Marital Status: Never married  Intimate Partner Violence: Not At Risk (03/02/2023)   Humiliation, Afraid, Rape, and Kick questionnaire    Fear of Current or Ex-Partner: No    Emotionally Abused: No    Physically Abused: No    Sexually Abused: No     BP 110/66   Pulse 84   Ht 5\' 3"  (1.6 m)   Wt 177 lb 6.4 oz (80.5 kg)   SpO2 94%   BMI 31.42 kg/m   Physical Exam:  Well appearing NAD HEENT: Unremarkable Neck:  No JVD, no thyromegally Lymphatics:  No adenopathy Back:  No CVA tenderness Lungs:  Clear with no wheezes; well healed median sternotomy. HEART:  Regular rate rhythm, no murmurs, no rubs, no clicks Abd:  soft, positive bowel sounds, no organomegally, no rebound, no guarding Ext:  2 plus pulses, no edema, no cyanosis, no clubbing Skin:  No rashes no nodules Neuro:  CN II through XII intact, motor grossly intact   DEVICE  Normal device function.  See PaceArt for details.   Assess/Plan:  Uncontrolled atrial fib - she is s/p  AV node ablation. Her symptoms are improved.  Chronic diastolic heart failure - her  symptoms are improved since AV node ablation. She will continue her current meds.  3.  PPM -her single chamber medtronic PPM is working normally. 4. Coags - she has not had any bleeding. We will follow.  5. 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  Sharlot Gowda Tamiyah Moulin,MD

## 2023-04-06 NOTE — Patient Instructions (Addendum)
Medication Instructions:  Your physician recommends that you continue on your current medications as directed. Please refer to the Current Medication list given to you today.  *If you need a refill on your cardiac medications before your next appointment, please call your pharmacy*  Lab Work: None ordered.  If you have labs (blood work) drawn today and your tests are completely normal, you will receive your results only by: MyChart Message (if you have MyChart) OR A paper copy in the mail If you have any lab test that is abnormal or we need to change your treatment, we will call you to review the results.  Testing/Procedures: None ordered.  Follow-Up: At [facility], you and your health needs are our priority.  As part of our continuing mission to provide you with exceptional heart care, we have created designated Provider Care Teams.  These Care Teams include your primary Cardiologist (physician) and Advanced Practice Providers (APPs -  Physician Assistants and Nurse Practitioners) who all work together to provide you with the care you need, when you need it.  We recommend signing up for the patient portal called "MyChart".  Sign up information is provided on this After Visit Summary.  MyChart is used to connect with patients for Virtual Visits (Telemedicine).  Patients are able to view lab/test results, encounter notes, upcoming appointments, etc.  Non-urgent messages can be sent to your provider as well.   To learn more about what you can do with MyChart, go to [email or web address].    Your next appointment:   1 year(s)  The format for your next appointment:   In Person  Provider:   Lewayne Bunting, MD{or one of the following Advanced Practice Providers on your designated Care Team:   Francis Dowse, [place] Casimiro Needle "Mardelle Matte" [place], [place] Earnest Rosier, NP   Important Information About Sugar

## 2023-04-07 ENCOUNTER — Other Ambulatory Visit: Payer: Self-pay | Admitting: Surgery

## 2023-04-07 ENCOUNTER — Telehealth: Payer: Self-pay | Admitting: Internal Medicine

## 2023-04-07 DIAGNOSIS — I480 Paroxysmal atrial fibrillation: Secondary | ICD-10-CM | POA: Diagnosis not present

## 2023-04-07 DIAGNOSIS — I495 Sick sinus syndrome: Secondary | ICD-10-CM | POA: Diagnosis not present

## 2023-04-07 DIAGNOSIS — Z48812 Encounter for surgical aftercare following surgery on the circulatory system: Secondary | ICD-10-CM | POA: Diagnosis not present

## 2023-04-07 DIAGNOSIS — E1122 Type 2 diabetes mellitus with diabetic chronic kidney disease: Secondary | ICD-10-CM | POA: Diagnosis not present

## 2023-04-07 DIAGNOSIS — N1832 Chronic kidney disease, stage 3b: Secondary | ICD-10-CM | POA: Diagnosis not present

## 2023-04-07 DIAGNOSIS — I051 Rheumatic mitral insufficiency: Secondary | ICD-10-CM | POA: Diagnosis not present

## 2023-04-07 DIAGNOSIS — I2510 Atherosclerotic heart disease of native coronary artery without angina pectoris: Secondary | ICD-10-CM | POA: Diagnosis not present

## 2023-04-07 DIAGNOSIS — I130 Hypertensive heart and chronic kidney disease with heart failure and stage 1 through stage 4 chronic kidney disease, or unspecified chronic kidney disease: Secondary | ICD-10-CM | POA: Diagnosis not present

## 2023-04-07 DIAGNOSIS — I5032 Chronic diastolic (congestive) heart failure: Secondary | ICD-10-CM | POA: Diagnosis not present

## 2023-04-07 DIAGNOSIS — Z951 Presence of aortocoronary bypass graft: Secondary | ICD-10-CM

## 2023-04-07 NOTE — Telephone Encounter (Signed)
Calling for the perimeter for patient blood pressure. Please advise

## 2023-04-08 ENCOUNTER — Ambulatory Visit
Admission: RE | Admit: 2023-04-08 | Discharge: 2023-04-08 | Disposition: A | Discharge status: Home / Self Care | Payer: Medicare HMO | Source: Ambulatory Visit | Attending: Surgery | Admitting: Surgery

## 2023-04-08 ENCOUNTER — Encounter: Payer: Self-pay | Admitting: Surgery

## 2023-04-08 ENCOUNTER — Ambulatory Visit (INDEPENDENT_AMBULATORY_CARE_PROVIDER_SITE_OTHER): Payer: Self-pay | Admitting: Surgery

## 2023-04-08 VITALS — BP 121/82 | HR 94 | Resp 18 | Ht 63.0 in | Wt 179.0 lb

## 2023-04-08 DIAGNOSIS — Z951 Presence of aortocoronary bypass graft: Secondary | ICD-10-CM

## 2023-04-08 DIAGNOSIS — J90 Pleural effusion, not elsewhere classified: Secondary | ICD-10-CM | POA: Diagnosis not present

## 2023-04-08 NOTE — Telephone Encounter (Signed)
Spoke with Emily Farmer from [facility] homehealth. Told her Dr Ladona Ridgel said that "a BP from 95-160 would be fine for perimeters".

## 2023-04-08 NOTE — Progress Notes (Signed)
HPI: Patient returns for routine postoperative follow-up having undergone coronary bypass graft surgery x 3 on 03/09/2023. The patient's early postoperative recovery while in the hospital was notable for a slow postoperative course due to preoperative deconditioning.  She had postoperative vasoplegia and was started on midodrine.  This was tapered to 5 mg twice daily prior to discharge.  She was on Toprol XL 100 mg daily and Cardizem CD 120 mg daily on admission.  These were not started postoperatively due to hypotension. Since hospital discharge the patient reports that she has been slowly improving.  She denies any chest pain or shortness of breath.  Her initial blood pressure when she came in the office today was 77/52 and on repeat was 121/82 with a pulse of 94.  She said that she has been taking her Toprol-XL and Cardizem at home although she was told not to take these on her discharge summary.   Current Outpatient Medications  Medication Sig Dispense Refill   Accu-Chek Softclix Lancets lancets Use to check blood sugar 3 times daily. 100 each 3   acetaminophen (TYLENOL) 325 MG tablet Take 2 tablets (650 mg total) by mouth every 6 (six) hours as needed for mild pain (or Fever >/= 101).     apixaban (ELIQUIS) 5 MG TABS tablet Take 1 tablet (5 mg total) by mouth in the morning and at bedtime. 180 tablet 1   aspirin EC 81 MG tablet Take 1 tablet (81 mg total) by mouth daily. Swallow whole.     atorvastatin (LIPITOR) 40 MG tablet Take 1 tablet (40 mg total) by mouth daily. 90 tablet 3   Blood Glucose Monitoring Suppl (ACCU-CHEK GUIDE) w/Device KIT Use to check blood sugar 3 times daily. 1 kit 0   Blood Pressure Monitoring (BLOOD PRESSURE MONITOR/ARM) DEVI Check blood pressure once per day at least 2 hours after medications. 1 each 0   ciclopirox (PENLAC) 8 % solution Apply topically at bedtime. Apply over nail and surrounding skin. Apply daily over previous coat. After seven (7) days, may remove  with alcohol and continue cycle. 6.6 mL 0   diclofenac Sodium (VOLTAREN) 1 % GEL Apply 2 g topically 4 (four) times daily. Apply across lower back for back pain (Patient taking differently: Apply 2 g topically 4 (four) times daily as needed (pain). Apply across lower back for back pain) 100 g 0   fenofibrate (TRICOR) 48 MG tablet Take 1 tablet (48 mg total) by mouth daily. 90 tablet 1   glucose blood (ACCU-CHEK GUIDE) test strip Use to check blood sugar 3 times daily. 100 each 2   insulin glargine (LANTUS SOLOSTAR) 100 UNIT/ML Solostar Pen Inject 55 Units into the skin daily. (Patient taking differently: Inject 10 Units into the skin daily.) 45 mL 3   insulin lispro (HUMALOG KWIKPEN) 100 UNIT/ML KwikPen Max daily 45 units (Patient taking differently: Inject 5 Units into the skin with breakfast, with lunch, and with evening meal.) 45 mL 2   Insulin Pen Needle 32G X 4 MM MISC use in the morning, at noon, in the evening, and at bedtime. 400 each 3   Insulin Syringe-Needle U-100 (TRUEPLUS INSULIN SYRINGE) 31G X 5/16" 0.3 ML MISC Use to inject insulin daily. (Patient taking differently: 1 each by Other route See admin instructions. Use to inject insulin daily.) 100 each 11   liraglutide (VICTOZA) 18 MG/3ML SOPN Inject 1.8 mg into the skin daily. 27 mL 3   midodrine (PROAMATINE) 5 MG tablet Take 1 tablet (5  mg total) by mouth 2 (two) times daily with a meal.     Multiple Vitamins-Minerals (CENTRUM SILVER PO) Take 1 tablet by mouth daily.     Multiple Vitamins-Minerals (OCUVITE ADULT 50+) CAPS Take 1 capsule by mouth daily.     oxyCODONE (OXY IR/ROXICODONE) 5 MG immediate release tablet Take 1 tablet (5 mg total) by mouth every 4 (four) hours as needed for severe pain. 30 tablet 0   oxyCODONE (OXY IR/ROXICODONE) 5 MG immediate release tablet Take 1 tablet (5 mg total) by mouth every 4 (four) hours as needed for severe pain 42 tablet 0   pioglitazone (ACTOS) 15 MG tablet Take 1 tablet (15 mg total) by mouth  daily. 90 tablet 3   Continuous Blood Gluc Sensor (DEXCOM G7 SENSOR) MISC 1 Device by Does not apply route as directed. (Patient not taking: Reported on 04/08/2023) 9 each 3   ferrous sulfate 325 (65 FE) MG tablet Take 1 tablet (325 mg total) by mouth daily with breakfast. 30 tablet 4   potassium chloride SA (KLOR-CON M) 20 MEQ tablet Take 1 tablet (20 mEq total) by mouth daily. (Patient not taking: Reported on 04/08/2023)     torsemide (DEMADEX) 20 MG tablet Take 1 tablet (20 mg total) by mouth daily. (Patient not taking: Reported on 04/08/2023)     No current facility-administered medications for this visit.    Physical Exam: BP 121/82 (BP Location: Right Arm, Patient Position: Sitting)   Pulse 94   Resp 18   Ht 5\' 3"  (1.6 m)   Wt 179 lb (81.2 kg)   SpO2 91% Comment: RA  BMI 31.71 kg/m  She looks well. Cardiac exam shows a regular rate and rhythm with normal heart sounds.  There is no murmur. Lungs are clear. Chest incision is healing well and the sternum is stable. Her leg incision is healing well and there is moderate bilateral lower leg edema.  Diagnostic Tests:  Chest x-ray today shows clear lung fields with resolution of her bilateral pleural effusions.  Impression:  She is doing fairly well at 1 month following her surgery.  I asked her to discontinue the Toprol and diltiazem for now.  I also asked her to stop the midodrine.  We will see how her pressure runs over the next month and decide about getting her back on a lower dose of metoprolol and/or Cardizem.  She still has some lower extremity edema and I asked her to continue her torsemide 20 mg daily.  I encouraged her to continue walking is much as possible.  Also asked her to watch her sodium intake and to keep her legs elevated when she is not ambulating.  I asked her not to lift anything heavier than 10 pounds for 3 months postoperatively.  Plan:  She has an appointment with Dr. Rennis Golden in about 2 weeks.  I will plan to  see her back in 1 month for follow-up.   [name], MD [facility] [phone]

## 2023-04-09 DIAGNOSIS — H02833 Dermatochalasis of right eye, unspecified eyelid: Secondary | ICD-10-CM | POA: Diagnosis not present

## 2023-04-09 DIAGNOSIS — H02836 Dermatochalasis of left eye, unspecified eyelid: Secondary | ICD-10-CM | POA: Diagnosis not present

## 2023-04-09 DIAGNOSIS — E113512 Type 2 diabetes mellitus with proliferative diabetic retinopathy with macular edema, left eye: Secondary | ICD-10-CM | POA: Diagnosis not present

## 2023-04-09 DIAGNOSIS — H35033 Hypertensive retinopathy, bilateral: Secondary | ICD-10-CM | POA: Diagnosis not present

## 2023-04-10 ENCOUNTER — Telehealth: Payer: Self-pay | Admitting: Internal Medicine

## 2023-04-10 ENCOUNTER — Other Ambulatory Visit: Payer: Self-pay

## 2023-04-10 DIAGNOSIS — I480 Paroxysmal atrial fibrillation: Secondary | ICD-10-CM | POA: Diagnosis not present

## 2023-04-10 DIAGNOSIS — I2510 Atherosclerotic heart disease of native coronary artery without angina pectoris: Secondary | ICD-10-CM | POA: Diagnosis not present

## 2023-04-10 DIAGNOSIS — I130 Hypertensive heart and chronic kidney disease with heart failure and stage 1 through stage 4 chronic kidney disease, or unspecified chronic kidney disease: Secondary | ICD-10-CM | POA: Diagnosis not present

## 2023-04-10 DIAGNOSIS — I051 Rheumatic mitral insufficiency: Secondary | ICD-10-CM | POA: Diagnosis not present

## 2023-04-10 DIAGNOSIS — N1832 Chronic kidney disease, stage 3b: Secondary | ICD-10-CM | POA: Diagnosis not present

## 2023-04-10 DIAGNOSIS — E1122 Type 2 diabetes mellitus with diabetic chronic kidney disease: Secondary | ICD-10-CM | POA: Diagnosis not present

## 2023-04-10 DIAGNOSIS — I495 Sick sinus syndrome: Secondary | ICD-10-CM | POA: Diagnosis not present

## 2023-04-10 DIAGNOSIS — Z48812 Encounter for surgical aftercare following surgery on the circulatory system: Secondary | ICD-10-CM | POA: Diagnosis not present

## 2023-04-10 DIAGNOSIS — I5032 Chronic diastolic (congestive) heart failure: Secondary | ICD-10-CM | POA: Diagnosis not present

## 2023-04-10 NOTE — Telephone Encounter (Signed)
Dois Davenport from [facility] called stated when patient was in the hospital she was given a water pill and told she needed to be on a water pill daily. Dois Davenport wants to see if that is the case if provider would send the script to pharmacy. Please f/u with patient.

## 2023-04-10 NOTE — Telephone Encounter (Signed)
Call to [facility] from [facility]  message left on VM  Call was to advise that the torsemide [facility])   Was filled by    Barrett, Rae Roam, PA-C  and she should contact her at [phone]

## 2023-04-13 ENCOUNTER — Other Ambulatory Visit: Payer: Self-pay

## 2023-04-13 ENCOUNTER — Emergency Department (HOSPITAL_COMMUNITY): Payer: Medicare HMO

## 2023-04-13 ENCOUNTER — Telehealth: Payer: Self-pay | Admitting: Internal Medicine

## 2023-04-13 ENCOUNTER — Emergency Department (HOSPITAL_COMMUNITY)
Admission: EM | Admit: 2023-04-13 | Discharge: 2023-04-14 | Disposition: A | Discharge status: Home / Self Care | Payer: Medicare HMO | Attending: Emergency Medicine | Admitting: Emergency Medicine

## 2023-04-13 ENCOUNTER — Ambulatory Visit: Payer: Self-pay

## 2023-04-13 DIAGNOSIS — J984 Other disorders of lung: Secondary | ICD-10-CM | POA: Diagnosis not present

## 2023-04-13 DIAGNOSIS — I110 Hypertensive heart disease with heart failure: Secondary | ICD-10-CM | POA: Diagnosis not present

## 2023-04-13 DIAGNOSIS — M549 Dorsalgia, unspecified: Secondary | ICD-10-CM | POA: Diagnosis not present

## 2023-04-13 DIAGNOSIS — I509 Heart failure, unspecified: Secondary | ICD-10-CM | POA: Diagnosis not present

## 2023-04-13 DIAGNOSIS — R918 Other nonspecific abnormal finding of lung field: Secondary | ICD-10-CM | POA: Diagnosis not present

## 2023-04-13 DIAGNOSIS — Z794 Long term (current) use of insulin: Secondary | ICD-10-CM | POA: Diagnosis not present

## 2023-04-13 DIAGNOSIS — I4891 Unspecified atrial fibrillation: Secondary | ICD-10-CM | POA: Diagnosis not present

## 2023-04-13 DIAGNOSIS — I130 Hypertensive heart and chronic kidney disease with heart failure and stage 1 through stage 4 chronic kidney disease, or unspecified chronic kidney disease: Secondary | ICD-10-CM | POA: Insufficient documentation

## 2023-04-13 DIAGNOSIS — Z951 Presence of aortocoronary bypass graft: Secondary | ICD-10-CM

## 2023-04-13 DIAGNOSIS — J181 Lobar pneumonia, unspecified organism: Secondary | ICD-10-CM | POA: Insufficient documentation

## 2023-04-13 DIAGNOSIS — N1832 Chronic kidney disease, stage 3b: Secondary | ICD-10-CM | POA: Diagnosis not present

## 2023-04-13 DIAGNOSIS — M79603 Pain in arm, unspecified: Secondary | ICD-10-CM | POA: Diagnosis not present

## 2023-04-13 DIAGNOSIS — J168 Pneumonia due to other specified infectious organisms: Secondary | ICD-10-CM | POA: Diagnosis not present

## 2023-04-13 DIAGNOSIS — I700 Atherosclerosis of aorta: Secondary | ICD-10-CM | POA: Diagnosis not present

## 2023-04-13 DIAGNOSIS — I051 Rheumatic mitral insufficiency: Secondary | ICD-10-CM | POA: Diagnosis not present

## 2023-04-13 DIAGNOSIS — R079 Chest pain, unspecified: Secondary | ICD-10-CM

## 2023-04-13 DIAGNOSIS — I25110 Atherosclerotic heart disease of native coronary artery with unstable angina pectoris: Secondary | ICD-10-CM

## 2023-04-13 DIAGNOSIS — I495 Sick sinus syndrome: Secondary | ICD-10-CM | POA: Diagnosis not present

## 2023-04-13 DIAGNOSIS — Z7901 Long term (current) use of anticoagulants: Secondary | ICD-10-CM | POA: Insufficient documentation

## 2023-04-13 DIAGNOSIS — Z48812 Encounter for surgical aftercare following surgery on the circulatory system: Secondary | ICD-10-CM | POA: Diagnosis not present

## 2023-04-13 DIAGNOSIS — N189 Chronic kidney disease, unspecified: Secondary | ICD-10-CM | POA: Diagnosis not present

## 2023-04-13 DIAGNOSIS — Z7982 Long term (current) use of aspirin: Secondary | ICD-10-CM | POA: Diagnosis not present

## 2023-04-13 DIAGNOSIS — J90 Pleural effusion, not elsewhere classified: Secondary | ICD-10-CM | POA: Diagnosis not present

## 2023-04-13 DIAGNOSIS — I5032 Chronic diastolic (congestive) heart failure: Secondary | ICD-10-CM | POA: Diagnosis not present

## 2023-04-13 DIAGNOSIS — J189 Pneumonia, unspecified organism: Secondary | ICD-10-CM

## 2023-04-13 DIAGNOSIS — I2510 Atherosclerotic heart disease of native coronary artery without angina pectoris: Secondary | ICD-10-CM | POA: Diagnosis not present

## 2023-04-13 DIAGNOSIS — R0789 Other chest pain: Secondary | ICD-10-CM | POA: Diagnosis not present

## 2023-04-13 DIAGNOSIS — I480 Paroxysmal atrial fibrillation: Secondary | ICD-10-CM | POA: Diagnosis not present

## 2023-04-13 DIAGNOSIS — I10 Essential (primary) hypertension: Secondary | ICD-10-CM | POA: Diagnosis not present

## 2023-04-13 DIAGNOSIS — E1122 Type 2 diabetes mellitus with diabetic chronic kidney disease: Secondary | ICD-10-CM | POA: Diagnosis not present

## 2023-04-13 LAB — CBC
HCT: 33.6 % — ABNORMAL LOW (ref 36.0–46.0)
Hemoglobin: 10.3 g/dL — ABNORMAL LOW (ref 12.0–15.0)
MCH: 27 pg (ref 26.0–34.0)
MCHC: 30.7 g/dL (ref 30.0–36.0)
MCV: 88 fL (ref 80.0–100.0)
Platelets: 206 10*3/uL (ref 150–400)
RBC: 3.82 MIL/uL — ABNORMAL LOW (ref 3.87–5.11)
RDW: 14.7 % (ref 11.5–15.5)
WBC: 5.1 10*3/uL (ref 4.0–10.5)
nRBC: 0 % (ref 0.0–0.2)

## 2023-04-13 LAB — BRAIN NATRIURETIC PEPTIDE: B Natriuretic Peptide: 223.5 pg/mL — ABNORMAL HIGH (ref 0.0–100.0)

## 2023-04-13 LAB — BASIC METABOLIC PANEL
Anion gap: 10 (ref 5–15)
BUN: 27 mg/dL — ABNORMAL HIGH (ref 8–23)
CO2: 24 mmol/L (ref 22–32)
Calcium: 9.3 mg/dL (ref 8.9–10.3)
Chloride: 100 mmol/L (ref 98–111)
Creatinine, Ser: 1.06 mg/dL — ABNORMAL HIGH (ref 0.44–1.00)
GFR, Estimated: 58 mL/min — ABNORMAL LOW (ref >60)
Glucose, Bld: 205 mg/dL — ABNORMAL HIGH (ref 70–99)
Potassium: 4.2 mmol/L (ref 3.5–5.1)
Sodium: 134 mmol/L — ABNORMAL LOW (ref 135–145)

## 2023-04-13 LAB — TROPONIN I (HIGH SENSITIVITY)
Troponin I (High Sensitivity): 22 ng/L — ABNORMAL HIGH (ref <18)
Troponin I (High Sensitivity): 20 ng/L — ABNORMAL HIGH (ref <18)

## 2023-04-13 MED ORDER — AMOXICILLIN-POT CLAVULANATE 875-125 MG PO TABS
1.0000 | ORAL_TABLET | Freq: Two times a day (BID) | ORAL | 0 refills | Status: AC
Start: 2023-04-13 — End: 2023-04-22
  Filled 2023-04-13: qty 14, 7d supply, fill #0

## 2023-04-13 MED ORDER — DOXYCYCLINE HYCLATE 100 MG PO CAPS
100.0000 mg | ORAL_CAPSULE | Freq: Two times a day (BID) | ORAL | 0 refills | Status: DC
  Filled 2023-04-13: qty 20, 10d supply, fill #0

## 2023-04-13 MED ORDER — IOHEXOL 350 MG/ML SOLN
75.0000 mL | Freq: Once | INTRAVENOUS | Status: AC | PRN
  Administered 2023-04-13: 75 mL via INTRAVENOUS

## 2023-04-13 MED ORDER — FUROSEMIDE 20 MG PO TABS
20.0000 mg | ORAL_TABLET | Freq: Every day | ORAL | 0 refills | Status: DC
  Filled 2023-04-13: qty 5, 5d supply, fill #0

## 2023-04-13 NOTE — ED Triage Notes (Signed)
Pt to ED via [facility] from home. Pt c/o CP, back pain, and bilateral arms that started yesterday. Pt states she has chronic pain in same places and takes oxycodone at home but states her medications at home today did not relieve her pain so she called EMS. Pt has hx of CABG x1 month ago. Pt denies SOB. Pt denies N/V. Pt denies any injuries.    EMS: 210 CBG 80 HR 160/80

## 2023-04-13 NOTE — ED Provider Notes (Signed)
[place] EMERGENCY DEPARTMENT AT [facility] Provider Note   CSN: [id number] Arrival date & time: 04/13/23  1732     History {Add pertinent medical, surgical, social history, OB history to HPI:1} Chief Complaint  Patient presents with   Chest Pain    Emily Farmer is a 67 y.o. female.  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   Sunday was using right arm to get something, then suddenly left chest began to hurt and radiated to the back, was doing that half the night, took oxycodone and it went away, this AM it came back, too pain medication then came back again.  Felt like a jabbing pain left chest and left back.  Trying not to move arms too much.    This afternoon pain went down to 5-6, was a 9/10 in the AM Shortness of breath yesterday, just going to the bathroom and back.   When has afib will sort of like pass out,  this time felt similar pain was more severe, dyspnea tried to close eyes and then passed out or fell asleep then this morning it came back Both legs swollen Last week had nausea, no nausea or vomiting now, no abdominal pain No fever Not dizzy or lightheaded, just weak when getting up to walk. 9 Dyspnea when laying flat Leg swelling for one week, used to be on fluid pill but was stopped in the hospital Pain is worse with moving left arm Home Medications Prior to Admission medications   Medication Sig Start Date End Date Taking? Authorizing Provider  Accu-Chek Softclix Lancets lancets Use to check blood sugar 3 times daily. 01/13/23   Marcine Matar, MD  acetaminophen (TYLENOL) 325 MG tablet Take 2 tablets (650 mg total) by mouth every 6 (six) hours as needed for mild pain (or Fever >/= 101). 02/01/18   Elgergawy, Leana Roe, MD  apixaban (ELIQUIS) 5 MG TABS  tablet Take 1 tablet (5 mg total) by mouth in the morning and at bedtime. 11/07/22   Marcine Matar, MD  aspirin EC 81 MG tablet Take 1 tablet (81 mg total) by mouth daily. Swallow whole. 03/18/23   Barrett, Nelda Luckey R, PA-C  atorvastatin (LIPITOR) 40 MG tablet Take 1 tablet (40 mg total) by mouth daily. 06/23/22   Marcine Matar, MD  Blood Glucose Monitoring Suppl (ACCU-CHEK GUIDE) w/Device KIT Use to check blood sugar 3 times daily. 01/13/23   Marcine Matar, MD  Blood Pressure Monitoring (BLOOD PRESSURE MONITOR/ARM) DEVI Check blood pressure once per day at least 2 hours after medications. 07/22/21   Alver Sorrow, NP  ciclopirox (PENLAC) 8 % solution Apply topically at bedtime. Apply over nail and surrounding skin. Apply daily over previous coat. After seven (7) days, may remove with alcohol and continue cycle. 08/29/22   Vivi Barrack, DPM  Continuous Blood Gluc Sensor (DEXCOM G7 SENSOR) MISC 1 Device by Does not apply route as directed. Patient not taking: Reported on 04/08/2023 08/22/22   Shamleffer, Konrad Dolores, MD  diclofenac Sodium (VOLTAREN) 1 % GEL Apply 2 g topically 4 (four) times daily. Apply across lower back for back pain Patient taking differently: Apply 2 g topically 4 (four) times daily as needed (pain). Apply across lower back for back pain 11/18/22   Marcine Matar, MD  fenofibrate (TRICOR) 48 MG tablet Take 1 tablet (48 mg total) by mouth daily. 11/19/22   Marcine Matar, MD  ferrous sulfate 325 (65 FE) MG tablet Take 1 tablet (325 mg total) by mouth daily with breakfast. 04/11/21 03/01/23  Anders Simmonds, PA-C  glucose blood (ACCU-CHEK GUIDE) test strip Use to check blood sugar 3 times daily. 01/13/23   Marcine Matar, MD  insulin glargine (LANTUS SOLOSTAR) 100 UNIT/ML Solostar Pen Inject 55 Units into the skin daily. Patient taking differently: Inject 10 Units into the skin daily. 11/18/22   Marcine Matar, MD  insulin lispro (HUMALOG KWIKPEN) 100 UNIT/ML  KwikPen Max daily 45 units Patient taking differently: Inject 5 Units into the skin with breakfast, with lunch, and with evening meal. 08/22/22   Shamleffer, Konrad Dolores, MD  Insulin Pen Needle 32G X 4 MM MISC use in the morning, at noon, in the evening, and at bedtime. 08/22/22   Shamleffer, Konrad Dolores, MD  Insulin Syringe-Needle U-100 (TRUEPLUS INSULIN SYRINGE) 31G X 5/16" 0.3 ML MISC Use to inject insulin daily. Patient taking differently: 1 each by Other route See admin instructions. Use to inject insulin daily. 05/06/18   Fulp, Cammie, MD  liraglutide (VICTOZA) 18 MG/3ML SOPN Inject 1.8 mg into the skin daily. 08/22/22   Shamleffer, Konrad Dolores, MD  Multiple Vitamins-Minerals (CENTRUM SILVER PO) Take 1 tablet by mouth daily.    [provider]  Multiple Vitamins-Minerals (OCUVITE ADULT 50+) CAPS Take 1 capsule by mouth daily.    [provider]  oxyCODONE (OXY IR/ROXICODONE) 5 MG immediate release tablet Take 1 tablet (5 mg total) by mouth every 4 (four) hours as needed for severe pain. 03/18/23   Barrett, Kameren Baade R, PA-C  oxyCODONE (OXY IR/ROXICODONE) 5 MG immediate release tablet Take 1 tablet (5 mg total) by mouth every 4 (four) hours as needed for severe pain 03/28/23     pioglitazone (ACTOS) 15 MG tablet Take 1 tablet (15 mg total) by mouth daily. 08/22/22   Shamleffer, Konrad Dolores, MD  potassium chloride SA (KLOR-CON M) 20 MEQ tablet Take 1 tablet (20 mEq total) by mouth daily. Patient not taking: Reported on 04/08/2023 03/18/23   Barrett, Rae Roam, PA-C  torsemide (DEMADEX) 20 MG tablet Take 1 tablet (20 mg total) by mouth daily. Patient not taking: Reported on 04/08/2023 03/18/23   Barrett, Rae Roam, PA-C      Allergies    Trulicity [dulaglutide]    Review of Systems   Review of Systems  Physical Exam Updated Vital Signs BP 139/73   Pulse 70   Temp 97.6 F (36.4 C) (Oral)   Resp 15   SpO2 100%  Physical Exam  ED Results / Procedures / Treatments    Labs (all labs ordered are listed, but only abnormal results are displayed) Labs Reviewed  BASIC METABOLIC PANEL - Abnormal; Notable for the following components:      Result Value   Sodium 134 (*)    Glucose, Bld 205 (*)    BUN 27 (*)    Creatinine, Ser 1.06 (*)    GFR, Estimated 58 (*)    All other components within normal limits  CBC - Abnormal; Notable for the following components:   RBC 3.82 (*)    Hemoglobin 10.3 (*)    HCT 33.6 (*)    All other components within normal limits  TROPONIN I (HIGH SENSITIVITY) - Abnormal; Notable for the following components:   Troponin I (High Sensitivity) 22 (*)  All other components within normal limits  TROPONIN I (HIGH SENSITIVITY) - Abnormal; Notable for the following components:   Troponin I (High Sensitivity) 20 (*)    All other components within normal limits    EKG None  Radiology No results found.  Procedures Procedures  {Document cardiac monitor, telemetry assessment procedure when appropriate:1}  Medications Ordered in ED Medications - No data to display  ED Course/ Medical Decision Making/ A&P   {   Click here for ABCD2, HEART and other calculatorsREFRESH Note before signing :1}                              Medical Decision Making Amount and/or Complexity of Data Reviewed Labs: ordered. Radiology: ordered.  Risk Prescription drug management.   ***  Trace pleural effusion, partial left lower lobe atelectasis or pneumonia.   {Document critical care time when appropriate:1} {Document review of labs and clinical decision tools ie heart score, Chads2Vasc2 etc:1}  {Document your independent review of radiology images, and any outside records:1} {Document your discussion with family members, caretakers, and with consultants:1} {Document social determinants of health affecting pt's care:1} {Document your decision making why or why not admission, treatments were needed:1} Final Clinical Impression(s) / ED  Diagnoses Final diagnoses:  None    Rx / DC Orders ED Discharge Orders     None

## 2023-04-13 NOTE — Telephone Encounter (Signed)
Chief Complaint: Chest pain  Symptoms: moderate to severe chest pain, nausea  Frequency: comes and goes  Pertinent Negatives: Patient denies fever,  Disposition: [x] ED /[] Urgent Care (no appt availability in office) / [] Appointment(In office/virtual)/ []  [place] [facility]/ [] Home Care/ [] Refused Recommended Disposition /[] [place] [facility]/ []  Follow-up with PCP Additional Notes: Patient stated she had chest pain that started yesterday and it was 9/10 patient took pain medicine and it decreased to 6/10 after that. Patient is in pain that last over 5 minutes at a time and patient reports having a GABG in 03/14/23. Patient reports feeling nausea as well. OT assistant at the home as well. Care advice given and recommended patient go to the ED for evaluation. Patient stated she will go right now.  Reason for Disposition  [1] Chest pain (or "angina") comes and goes AND [2] is happening more often (increasing in frequency) or getting worse (increasing in severity)  (Exception: Chest pains that last only a few seconds.)  Answer Assessment - Initial Assessment Questions 1. LOCATION: "Where does it hurt?"       Chest pain  2. RADIATION: "Does the pain go anywhere else?" (e.g., into neck, jaw, arms, back)     To the left shoulder and under the arm  3. ONSET: "When did the chest pain begin?" (Minutes, hours or days)      Yesterday 4. PATTERN: "Does the pain come and go, or has it been constant since it started?"  "Does it get worse with exertion?"      Comes and goes  5. DURATION: "How long does it last" (e.g., seconds, minutes, hours)     About 3 minutes  6. SEVERITY: "How bad is the pain?"  (e.g., Scale 1-10; mild, moderate, or severe)    - MILD (1-3): doesn't interfere with normal activities     - MODERATE (4-7): interferes with normal activities or awakens from sleep    - SEVERE (8-10): excruciating pain, unable to do any normal activities       6/10 7. CARDIAC RISK FACTORS: "Do you have  any history of heart problems or risk factors for heart disease?" (e.g., angina, prior heart attack; diabetes, high blood pressure, high cholesterol, smoker, or strong family history of heart disease)     CABG 03/14/23, CHF 8. PULMONARY RISK FACTORS: "Do you have any history of lung disease?"  (e.g., blood clots in lung, asthma, emphysema, birth control pills)     Asthma, sleep apnea 9. CAUSE: "What do you think is causing the chest pain?"     No, I just don't know  10. OTHER SYMPTOMS: "Do you have any other symptoms?" (e.g., dizziness, nausea, vomiting, sweating, fever, difficulty breathing, cough)       Nausea  Protocols used: Chest Pain-A-AH

## 2023-04-13 NOTE — ED Provider Triage Note (Signed)
Emergency Medicine Provider Triage Evaluation Note  Emily Farmer , a 67 y.o. female  was evaluated in triage.  Pt complains of chest pain.  Localized to the right upper chest.  Has chronic pain.  States her oxycodone at home is not helping.  Pain is sharp and lasts 2 to 3 minutes at a time.  Had some shortness of breath yesterday.  Review of Systems  Positive: As above Negative: As above  Physical Exam  BP (!) 159/87 (BP Location: Left Arm)   Pulse 81   Temp 97.6 F (36.4 C) (Oral)   Resp 15   SpO2 97%  Gen:   Awake, no distress   Resp:  Normal effort  MSK:   Moves extremities without difficulty  Other:    Medical Decision Making  Medically screening exam initiated at 6:10 PM.  Appropriate orders placed.  Emily Farmer was informed that the remainder of the evaluation will be completed by another provider, this initial triage assessment does not replace that evaluation, and the importance of remaining in the ED until their evaluation is complete.  Workup initiated   Emily Farmer, Emily Farmer 04/13/23 1811

## 2023-04-13 NOTE — ED Notes (Signed)
Patient transported to CT 

## 2023-04-13 NOTE — Telephone Encounter (Signed)
Camryn calling from [facility] is calling to request an order for Rolator walker, shower chair. CB- [phone]  Fax was sent on 04/08/23 Fax- [phone]

## 2023-04-14 ENCOUNTER — Other Ambulatory Visit: Payer: Self-pay

## 2023-04-15 ENCOUNTER — Other Ambulatory Visit: Payer: Self-pay

## 2023-04-15 DIAGNOSIS — Z48812 Encounter for surgical aftercare following surgery on the circulatory system: Secondary | ICD-10-CM | POA: Diagnosis not present

## 2023-04-15 DIAGNOSIS — I480 Paroxysmal atrial fibrillation: Secondary | ICD-10-CM | POA: Diagnosis not present

## 2023-04-15 DIAGNOSIS — I2510 Atherosclerotic heart disease of native coronary artery without angina pectoris: Secondary | ICD-10-CM | POA: Diagnosis not present

## 2023-04-15 DIAGNOSIS — I495 Sick sinus syndrome: Secondary | ICD-10-CM | POA: Diagnosis not present

## 2023-04-15 DIAGNOSIS — N1832 Chronic kidney disease, stage 3b: Secondary | ICD-10-CM | POA: Diagnosis not present

## 2023-04-15 DIAGNOSIS — I5032 Chronic diastolic (congestive) heart failure: Secondary | ICD-10-CM | POA: Diagnosis not present

## 2023-04-15 DIAGNOSIS — E1122 Type 2 diabetes mellitus with diabetic chronic kidney disease: Secondary | ICD-10-CM | POA: Diagnosis not present

## 2023-04-15 DIAGNOSIS — I051 Rheumatic mitral insufficiency: Secondary | ICD-10-CM | POA: Diagnosis not present

## 2023-04-15 DIAGNOSIS — I130 Hypertensive heart and chronic kidney disease with heart failure and stage 1 through stage 4 chronic kidney disease, or unspecified chronic kidney disease: Secondary | ICD-10-CM | POA: Diagnosis not present

## 2023-04-15 NOTE — Telephone Encounter (Signed)
Just printed these rxns, will sign and give to you.

## 2023-04-15 NOTE — Telephone Encounter (Signed)
Prescription placed on provider desk.

## 2023-04-16 ENCOUNTER — Inpatient Hospital Stay: Payer: Medicare HMO | Admitting: Physician Assistant

## 2023-04-16 ENCOUNTER — Encounter: Payer: Medicare HMO | Admitting: Internal Medicine

## 2023-04-17 NOTE — Progress Notes (Unsigned)
Cardiology Office Note:  .   Date:  04/23/2023  ID:  Emily Farmer, DOB 1956/05/20, MRN [id number] PCP: Marcine Matar, MD  [facility] Providers Cardiologist:  Chrystie Nose, MD Electrophysiologist:  Lanier Prude, MD    History of Present Illness: .   Emily Farmer is a 67 y.o. female with a past medical history of diastolic heart failure, CAD s/p CABG, permanent A-fib s/p AV node ablation and single-chamber PPM in 12/2022, type 2 DM on insulin, HLD. Patient is followed by Dr. Rennis Golden and Dr. Lalla Brothers. Presents today for a follow up appointment after CABG.   Per chart review, patient has a history of permanent atrial fibrillation. Underwent AV node ablation and implantation of a single-chamber permanent pacemaker on 01/02/23. About 2 months later, patient presented to [facility] complaining of chest heaviness and shortness of breath on exertion. She underwent echocardiogram on 03/02/23 that showed EF 60-65%, no wall motion abnormalities, mildly reduced RV function, severely elevated pulmonary artery pressure, mild MR. Underwent R/L heart catheterization on 03/03/23 that showed multivessel CAD and hemodynamic findings consistent with severe pulmonary hypertension.  Patient was seen by Advanced heart failure who recommended diuresis, SGLT2 inhibitor, and CABG.  Pre-CABG carotid ultrasounds showed 1-39% stenosis bilaterally.Underwent 3V CABG with SVG-LAD, SVG-PL, and SVG-PDA. After surgery, patient became hypotensive requiring midodrine. Patient was discharged on eliquis 5 mg BID, ASA 81 mg daily, lipitor 40 mg daily, midodrine 5 mg BID, torsemide 20 m daily, potassium supplementation.   Patient was seen by Dr. Ladona Ridgel on 04/06/2023 for ongoing evaluation of atrial fibrillation, tachybradycardia syndrome.  Patient was seen in the ED on 10/28 complaining of chest pain, dyspnea.  Reported using her right arm to get something and suddenly felt pain in the left side of her chest that  radiated to her back.  Took oxycodone and it went away.  Returned the next morning.  In the ED, BNP was elevated to 223.5.  High-sensitivity troponin 22, 20.  CTA chest showed no acute PE, cardiomegaly, trace left effusion, partial left lower lobe atelectasis or pneumonia.  Patient was started on Augmentin and doxycycline to cover for pneumonia.  She was started on low-dose Lasix and instructed to follow-up with cardiology.  Today, patient presents for a follow-up visit. They report feeling generally well, with the primary concern being the appearance of their surgical incision. Reports not knowing what the incision should look like at this point. Upon examination, surgical incision was found to be healing well. They report no other post-operative complications.  The patient notes occasional shortness of breath, particularly upon exertion such as walking. They describe this as feeling 'winded' but report no difficulty breathing at rest or when lying flat. They also mention a history of sleep apnea. They report some swelling in the ankles, more pronounced on the leg from which veins were harvested for the surgery. They are currently on furosemide (Lasix) and torsemide (Demadex), which they take regularly. They also mention taking oxycodone for back pain. They deny chest pain, dizziness, syncope, near syncope, palpitations. No bleeding on eliquis   The patient is also participating in a physical therapy program at a living facility and reports feeling tired and sore after sessions. However, they do not develop chest pain during PT. They also receive home visits from a nurse for additional support. They have not reported any significant weight gain or other signs of fluid retention.  ROS: Denies chest pain, palpitations, dizziness, syncope, near syncope. Some shortness of breath  on exertion and swelling in lower extremities   Studies Reviewed: .   Cardiac Studies & Procedures   CARDIAC  CATHETERIZATION  CARDIAC CATHETERIZATION 03/06/2023  Narrative 1.  Low right atrial pressure. 2. Normal PCWP.  Prominent V-waves but only mild MR by echo, may be due to diastolic dysfunction. 3. Mild primarily pulmonary arterial hypertension.  Significant improvement from prior study.   CARDIAC CATHETERIZATION 03/03/2023  Narrative   Prox RCA lesion is 99% stenosed. Subtotal Occlusion - TIMI 2 flow   Mid RCA lesion is 80% stenosed.  Mid RCA to Dist RCA lesion is 90% stenosed. Dist RCA lesion is 70% stenosed.   Mid Cx lesion is 80% stenosed prior to distal OM-LPL   Mid LAD lesion is 50% stenosed. Dist LAD lesion is 90% stenosed.   2nd Diag lesion is 70% stenosed.   Hemodynamic findings consistent with severe pulmonary hypertension.   There is no aortic valve stenosis.   SUMMARY Multivessel disease-question if she would be a better candidate for CABG versus multivessel PCI given concerns for renal insufficiency and elevated PAP.   RECOMMENDATIONS Patient has multivessel disease, I will start her back on IV heparin 8 hours after sheath removal pending assessment for potential multivessel PCI versus CABG. Gentle hydration with diuresis today, have consulted advanced heart failure team for assistance in management and TUNE-UP prior to consideration of CVTS consultation versus multivessel PCI.   Case discussed with Drs. Janne Napoleon, Marca Ancona as well as the patient's sister [name].   Bryan Lemma, MD  Findings Coronary Findings Diagnostic  Dominance: Right  Left Anterior Descending Mid LAD lesion is 50% stenosed. Dist LAD lesion is 90% stenosed.  First Diagonal Branch Vessel is small in size.  Second Diagonal Branch Vessel is small in size. 2nd Diag lesion is 70% stenosed.  Second Septal Branch Vessel is small in size.  Left Circumflex Mid Cx lesion is 80% stenosed.  Left Posterior Atrioventricular Artery Vessel is small in size.  Right Coronary  Artery Prox RCA lesion is 99% stenosed. Mid RCA lesion is 80% stenosed. Mid RCA to Dist RCA lesion is 90% stenosed. Dist RCA lesion is 70% stenosed.  Acute Marginal Branch Vessel is small in size.  Right Ventricular Branch Vessel is small in size.  Right Posterior Descending Artery Vessel is small in size. TIMI 2 FLOW Collaterals RPDA filled by collaterals from 3rd Sept.  First Right Posterolateral Branch Vessel is small in size.  Intervention  No interventions have been documented.     ECHOCARDIOGRAM  ECHOCARDIOGRAM COMPLETE 03/02/2023  Narrative ECHOCARDIOGRAM REPORT    Patient Name:   SHANEEKA SCARBORO Date of Exam: 03/02/2023 Medical Rec #:  [id number]     Height:       63.0 in Accession #:    [id number]    Weight:       180.8 lb Date of Birth:  09-14-1955    BSA:          1.852 m Patient Age:    66 years      BP:           122/69 mmHg Patient Gender: F             HR:           68 bpm. Exam Location:  Inpatient  Procedure: 2D Echo, Cardiac Doppler and Color Doppler  Indications:    Chest Pain  History:        Patient has prior history of Echocardiogram examinations, most recent  03/03/2022. CHF, Pacemaker, Arrythmias:Atrial Fibrillation; Risk Factors:Dyslipidemia and Hypertension.  Sonographer:    Melton Krebs RDCS, FE, PE Referring Phys: [id number] Cyndi Bender  IMPRESSIONS   1. Left ventricular ejection fraction, by estimation, is 60 to 65%. The left ventricle has normal function. The left ventricle has no regional wall motion abnormalities. Left ventricular diastolic parameters are indeterminate. Elevated left atrial pressure. There is the interventricular septum is flattened in systole and diastole, consistent with right ventricular pressure and volume overload. 2. Right ventricular systolic function is mildly reduced. The right ventricular size is moderately enlarged. There is severely elevated pulmonary artery systolic pressure. 3. Right atrial size was  moderately dilated. 4. The mitral valve is normal in structure. Mild mitral valve regurgitation. No evidence of mitral stenosis. 5. Tricuspid valve regurgitation is moderate. 6. The aortic valve is calcified. Aortic valve regurgitation is not visualized. Aortic valve sclerosis/calcification is present, without any evidence of aortic stenosis. 7. The inferior vena cava is dilated in size with <50% respiratory variability, suggesting right atrial pressure of 15 mmHg.  FINDINGS Left Ventricle: Left ventricular ejection fraction, by estimation, is 60 to 65%. The left ventricle has normal function. The left ventricle has no regional wall motion abnormalities. The left ventricular internal cavity size was normal in size. There is no left ventricular hypertrophy. The interventricular septum is flattened in systole and diastole, consistent with right ventricular pressure and volume overload. Left ventricular diastolic parameters are indeterminate. Elevated left atrial pressure.  Right Ventricle: The right ventricular size is moderately enlarged. Right ventricular systolic function is mildly reduced. There is severely elevated pulmonary artery systolic pressure. The tricuspid regurgitant velocity is 4.19 m/s, and with an assumed right atrial pressure of 15 mmHg, the estimated right ventricular systolic pressure is 85.2 mmHg.  Left Atrium: Left atrial size was normal in size.  Right Atrium: Right atrial size was moderately dilated.  Pericardium: There is no evidence of pericardial effusion.  Mitral Valve: The mitral valve is normal in structure. Mild mitral valve regurgitation. No evidence of mitral valve stenosis.  Tricuspid Valve: The tricuspid valve is normal in structure. Tricuspid valve regurgitation is moderate . No evidence of tricuspid stenosis.  Aortic Valve: The aortic valve is calcified. Aortic valve regurgitation is not visualized. Aortic valve sclerosis/calcification is present, without any  evidence of aortic stenosis. Aortic valve mean gradient measures 5.0 mmHg. Aortic valve peak gradient measures 8.8 mmHg. Aortic valve area, by VTI measures 2.02 cm.  Pulmonic Valve: The pulmonic valve was normal in structure. Pulmonic valve regurgitation is not visualized. No evidence of pulmonic stenosis.  Aorta: The aortic root is normal in size and structure.  Venous: The inferior vena cava is dilated in size with less than 50% respiratory variability, suggesting right atrial pressure of 15 mmHg.  IAS/Shunts: No atrial level shunt detected by color flow Doppler.  Additional Comments: A device lead is visualized.   LEFT VENTRICLE PLAX 2D LVIDd:         4.10 cm   Diastology LVIDs:         2.60 cm   LV e' medial:    5.77 cm/s LV PW:         1.00 cm   LV E/e' medial:  20.6 LV IVS:        1.00 cm   LV e' lateral:   5.55 cm/s LVOT diam:     1.80 cm   LV E/e' lateral: 21.4 LV SV:         60 LV  SV Index:   33 LVOT Area:     2.54 cm   RIGHT VENTRICLE RV S prime:     8.84 cm/s TAPSE (M-mode): 1.0 cm  LEFT ATRIUM             Index        RIGHT ATRIUM           Index LA diam:        4.00 cm 2.16 cm/m   RA Area:     16.40 cm LA Vol (A2C):   51.7 ml 27.91 ml/m  RA Volume:   40.90 ml  22.08 ml/m LA Vol (A4C):   70.8 ml 38.22 ml/m LA Biplane Vol: 65.2 ml 35.20 ml/m AORTIC VALVE AV Area (Vmax):    2.06 cm AV Area (Vmean):   1.99 cm AV Area (VTI):     2.02 cm AV Vmax:           148.00 cm/s AV Vmean:          103.000 cm/s AV VTI:            0.299 m AV Peak Grad:      8.8 mmHg AV Mean Grad:      5.0 mmHg LVOT Vmax:         120.00 cm/s LVOT Vmean:        80.700 cm/s LVOT VTI:          0.237 m LVOT/AV VTI ratio: 0.79  AORTA Ao Root diam: 2.80 cm  MITRAL VALVE                TRICUSPID VALVE MV Area (PHT): 3.61 cm     TR Peak grad:   70.2 mmHg MV Decel Time: 210 msec     TR Vmax:        419.00 cm/s MV E velocity: 119.00 cm/s MV A velocity: 30.90 cm/s   SHUNTS MV E/A  ratio:  3.85         Systemic VTI:  0.24 m Systemic Diam: 1.80 cm  Olga Millers MD Electronically signed by Olga Millers MD Signature Date/Time: 03/02/2023/3:15:02 PM    Final   TEE  ECHO INTRAOPERATIVE TEE 03/09/2023  Narrative *INTRAOPERATIVE TRANSESOPHAGEAL REPORT *    Patient Name:   Emily Farmer  Date of Exam: 03/09/2023 Medical Rec #:  [id number]      Height:       63.0 in Accession #:    [id number]     Weight:       166.4 lb Date of Birth:  Jun 02, 1956     BSA:          1.79 m Patient Age:    66 years       BP:           140/80 mmHg Patient Gender: F              HR:           60 bpm. Exam Location:  Anesthesiology  Transesophogeal exam was perform intraoperatively during surgical procedure. Patient was closely monitored under general anesthesia during the entirety of examination.  Indications:     CABG Performing Phys: [id number] [name] Diagnosing Phys: Jairo Ben MD  Complications: No known complications during this procedure. POST-OP IMPRESSIONS limited post-CPB exam: The patient separated easily from CPB. _ Left Ventricle: The left ventricular function is essentially unchanged from pre-bypass images. Contractility remains hyperdynamic, with overall EF > 65%. There is septal flattening, indicative of  pacing. _ Right Ventricle: The right ventricular function appears normal, unchanged from pre-bypass images. _ Aortic Valve: The aortic valve function appears normal, unchanged from pre-bypass. _ Mitral Valve: With initial separation from CPB, there was moderate mitral regurgitation, however this improved to trivial regurgitation by the end of the case. _ Tricuspid Valve: The tricuspid valve function appears unchanged from pre-bypass. There is trivial TR. _ Pulmonic Valve: The pulmonic valve appears unchanged from pre-bypass images, with mild-moderate insufficiency.  PRE-OP FINDINGS Left Ventricle: The left ventricle has hyperdynamic systolic  function, with an ejection fraction of >65%, measured 78%. The cavity size was normal. No evidence of left ventricular regional wall motion abnormalities. There is no left ventricular hypertrophy. Left ventricular diastolic function was not evaluated.  Right Ventricle: The right ventricle has normal systolic function. The cavity was normal. There is no increase in right ventricular wall thickness. Catheter present in the right ventricle.  Left Atrium: Left atrial size was normal in size. No left atrial/left atrial appendage thrombus was detected. Left atrial appendage velocity is reduced at less than 40 cm/s.  Right Atrium: Right atrial size was dilated. Catheter present in the right atrium.  Interatrial Septum: No atrial level shunt detected by color flow Doppler. There is no evidence of a patent foramen ovale.  Pericardium: There is no evidence of pericardial effusion.  Mitral Valve: The mitral valve is normal in structure. Mitral valve regurgitation is mild-moderate by color flow Doppler. There is no evidence of mitral valve vegetation. Pulmonary venous flow is normal. There is no evidence of mitral stenosis, woth mean gradient 1 mmHg, peak gradient 4 mmHg.  Tricuspid Valve: The tricuspid valve was normal in structure. Tricuspid valve regurgitation is trivial by color flow Doppler. No evidence of tricuspid stenosis is present. There is no evidence of tricuspid valve vegetation.  Aortic Valve: The aortic valve is tricuspid. Aortic valve regurgitation was not visualized by color flow Doppler. There is no stenosis of the aortic valve. There is no evidence of aortic valve vegetation. There is mild thickening present on the aortic valve non-coronary, left coronary and right coronary cusps with normal mobility.  Pulmonic Valve: The pulmonic valve was normal in structure, with normal leaflet excursion. No evidence of pulmonic stenosis. Pulmonic valve regurgitation is moderate by color flow  Doppler.   Aorta: The aortic root, ascending aorta and aortic arch are normal in size and structure.  Pulmonary Artery: Theone Murdoch catheter present on the right. The pulmonary artery is moderately dilated.  Venous: The inferior vena cava is dilated in size with less than 50% respiratory variability, suggesting right atrial pressure of 15 mmHg.  Shunts: There is no evidence of an atrial septal defect.  +--------------+-------++ LEFT VENTRICLE        +--------------+-------++ PLAX 2D               +--------------+-------++ LVIDd:        4.25 cm +--------------+-------++ LVIDs:        2.25 cm +--------------+-------++ LV PW:        0.55 cm +--------------+-------++ LV SV:        64 ml   +--------------+-------++ LV SV Index:  34.30   +--------------+-------++                       +--------------+-------++  +-------------+-----------++ AORTIC VALVE             +-------------+-----------++ AV Vmax:     118.00 cm/s +-------------+-----------++ AV Vmean:  75.400 cm/s +-------------+-----------++ AV VTI:      0.258 m     +-------------+-----------++ AV Peak Grad:5.6 mmHg    +-------------+-----------++ AV Mean Grad:2.5 mmHg    +-------------+-----------++  +-------------+---------++ MITRAL VALVE           +-------------+---------++ MV Peak grad:3.8 mmHg  +-------------+---------++ MV Mean grad:1.0 mmHg  +-------------+---------++ MV Vmax:     0.98 m/s  +-------------+---------++ MV Vmean:    50.5 cm/s +-------------+---------++ MV VTI:      0.24 m    +-------------+---------++   Jairo Ben MD Electronically signed by Jairo Ben MD Signature Date/Time: 03/09/2023/3:46:10 PM    Final            Risk Assessment/Calculations:    CHA2DS2-VASc Score = 5   This indicates a 7.2% annual risk of stroke. The patient's score is based upon: CHF History: 0 HTN History:  1 Diabetes History: 1 Stroke History: 0 Vascular Disease History: 1 Age Score: 1 Gender Score: 1            Physical Exam:   VS:  BP 122/72 (BP Location: Right Arm, Patient Position: Sitting, Cuff Size: Normal)   Pulse 89   Ht 5\' 3"  (1.6 m)   Wt 169 lb (76.7 kg)   BMI 29.94 kg/m    Wt Readings from Last 3 Encounters:  04/23/23 169 lb (76.7 kg)  04/08/23 179 lb (81.2 kg)  04/06/23 177 lb 6.4 oz (80.5 kg)    GEN: Well nourished, well developed in no acute distress. Sitting comfortably in the chair  NECK: No JVD; No carotid bruits CARDIAC: RRR, no murmurs, rubs, gallops. Radial pulses 2+ bilaterally  RESPIRATORY:  Clear to auscultation without rales, wheezing or rhonchi. Normal work of breathing on room air  ABDOMEN: Soft, non-tender, non-distended EXTREMITIES:  Trace edema in BLE, more noticeable on right; No deformity   ASSESSMENT AND PLAN: .    CAD S/P CABG  - Patient recently underwent 3V CABG with SVG to LAD, SVG to PL, SVG to PDA - Denies chest pain. Working with physical therapy and feels like she is making good progress. Sometimes gets a bit short of breath on exertion if she walks too fast, but otherwise breathing is stable. Gets tired easily  - Patient has follow up later this month with CT surgery. On my examination, her surgical incisions are healing well, no bleeding or dehiscence  - Continue ASA 81 mg daily  - Continue lipitor 40 mg daily  - Not on BB due to low BP   Chronic Diastolic Heart Failure  Pulmonary hypertension -Most recent echocardiogram from 03/02/2023 showed EF 60-65%, no regional wall motion abnormalities, mildly reduced RV function, severely elevated pulmonary artery systolic pressure.  After diuresis, right heart catheterization on 03/06/2023 showed mild primarily pulmonary arterial hypertension - Currently on torsemide 20 mg daily. When seen in the ED on 10/28, she was also given 5 days of lasix.  - Stop lasix  - On exam, lungs are clear and she  only has trace lower extremity edema, more noticeable in R leg (had veins taken from right left for CABG). Denies orthopnea, cough. Only gets short of breath on heavy exertion  - Continue torsemide 20 mg daily Instructed patient to take an additional 20 mg torsemide as needed for ankle swelling, weight gain >3 lbs in one day or 5 lbs in one week - Start jardiance 10 mg daily - patient denies UTIs. She has type 2 DM.  - Ordered BMP today  and a second BMP in 2 weeks   Hypotension  - After CABG, patient became hypotensive and was discharged on midodrine 5 mg BID. Now off midodrine   - BP stable. Denies dizziness, syncope, near syncope   Permanent Afib s/p AV nodal ablation and PPM  - Underwent AV nodal ablation and PPM implantation on 01/02/23 - Denies palpitations  - Device followed by Dr. Ladona Ridgel  - Continue eliquis 5 mg BID   HLD  - Lipid panel from 02/2023 showed LDL 32  - Continue lipitor 40 mg daily      Cardiac Rehabilitation Eligibility Assessment  The patient is ready to start cardiac rehabilitation pending clearance from the cardiac surgeon.       Dispo: Follow up in 6-8 weeks   Signed, Jonita Albee, PA-C

## 2023-04-20 ENCOUNTER — Telehealth: Payer: Self-pay

## 2023-04-20 DIAGNOSIS — I051 Rheumatic mitral insufficiency: Secondary | ICD-10-CM | POA: Diagnosis not present

## 2023-04-20 DIAGNOSIS — E1122 Type 2 diabetes mellitus with diabetic chronic kidney disease: Secondary | ICD-10-CM | POA: Diagnosis not present

## 2023-04-20 DIAGNOSIS — H40023 Open angle with borderline findings, high risk, bilateral: Secondary | ICD-10-CM | POA: Diagnosis not present

## 2023-04-20 DIAGNOSIS — I495 Sick sinus syndrome: Secondary | ICD-10-CM | POA: Diagnosis not present

## 2023-04-20 DIAGNOSIS — I480 Paroxysmal atrial fibrillation: Secondary | ICD-10-CM | POA: Diagnosis not present

## 2023-04-20 DIAGNOSIS — I130 Hypertensive heart and chronic kidney disease with heart failure and stage 1 through stage 4 chronic kidney disease, or unspecified chronic kidney disease: Secondary | ICD-10-CM | POA: Diagnosis not present

## 2023-04-20 DIAGNOSIS — Z48812 Encounter for surgical aftercare following surgery on the circulatory system: Secondary | ICD-10-CM | POA: Diagnosis not present

## 2023-04-20 DIAGNOSIS — N1832 Chronic kidney disease, stage 3b: Secondary | ICD-10-CM | POA: Diagnosis not present

## 2023-04-20 DIAGNOSIS — I2510 Atherosclerotic heart disease of native coronary artery without angina pectoris: Secondary | ICD-10-CM | POA: Diagnosis not present

## 2023-04-20 DIAGNOSIS — I5032 Chronic diastolic (congestive) heart failure: Secondary | ICD-10-CM | POA: Diagnosis not present

## 2023-04-20 DIAGNOSIS — Z961 Presence of intraocular lens: Secondary | ICD-10-CM | POA: Diagnosis not present

## 2023-04-20 DIAGNOSIS — E113593 Type 2 diabetes mellitus with proliferative diabetic retinopathy without macular edema, bilateral: Secondary | ICD-10-CM | POA: Diagnosis not present

## 2023-04-20 NOTE — Telephone Encounter (Signed)
I tried to reach the patient: [phone] and the recording stated that the call cannot be completed as dialed  She was a no show for her appointment with Corene Cornea, PA on 04/16/2023 and has been rescheduled to 05/06/2023.  I wanted to see if she would like to be seen by Dr Laural Benes prior to 05/06/2023.   I also sent a community message to Audrea Muscat, [facility] inquiring who ordered the home health services from [place]. The patient was discharged from the hospital to SNF.

## 2023-04-20 NOTE — Telephone Encounter (Signed)
Kameron calling from [facility]. Calling to check on the order that was Faxed 04/08/23, 04/16/23. Needing shower chair and Rolator chair. CB- [phone] [phone]

## 2023-04-21 ENCOUNTER — Telehealth: Payer: Self-pay | Admitting: Internal Medicine

## 2023-04-21 DIAGNOSIS — I130 Hypertensive heart and chronic kidney disease with heart failure and stage 1 through stage 4 chronic kidney disease, or unspecified chronic kidney disease: Secondary | ICD-10-CM | POA: Diagnosis not present

## 2023-04-21 DIAGNOSIS — I480 Paroxysmal atrial fibrillation: Secondary | ICD-10-CM | POA: Diagnosis not present

## 2023-04-21 DIAGNOSIS — Z48812 Encounter for surgical aftercare following surgery on the circulatory system: Secondary | ICD-10-CM | POA: Diagnosis not present

## 2023-04-21 DIAGNOSIS — N1832 Chronic kidney disease, stage 3b: Secondary | ICD-10-CM | POA: Diagnosis not present

## 2023-04-21 DIAGNOSIS — E1122 Type 2 diabetes mellitus with diabetic chronic kidney disease: Secondary | ICD-10-CM | POA: Diagnosis not present

## 2023-04-21 DIAGNOSIS — I495 Sick sinus syndrome: Secondary | ICD-10-CM | POA: Diagnosis not present

## 2023-04-21 DIAGNOSIS — I2510 Atherosclerotic heart disease of native coronary artery without angina pectoris: Secondary | ICD-10-CM | POA: Diagnosis not present

## 2023-04-21 DIAGNOSIS — I5032 Chronic diastolic (congestive) heart failure: Secondary | ICD-10-CM | POA: Diagnosis not present

## 2023-04-21 DIAGNOSIS — I051 Rheumatic mitral insufficiency: Secondary | ICD-10-CM | POA: Diagnosis not present

## 2023-04-21 NOTE — Telephone Encounter (Signed)
Copied from CRM [id number]. Topic: General - Other >> Apr 21, 2023  1:05 PM Phill Myron wrote: Emily Farmer with [facility] reported 04/21/23:  BP reading  170/90 and pt is  a-symptomatic and she is seeing her cardiologist this week

## 2023-04-21 NOTE — Telephone Encounter (Signed)
Noted  

## 2023-04-22 DIAGNOSIS — I051 Rheumatic mitral insufficiency: Secondary | ICD-10-CM | POA: Diagnosis not present

## 2023-04-22 DIAGNOSIS — I5032 Chronic diastolic (congestive) heart failure: Secondary | ICD-10-CM | POA: Diagnosis not present

## 2023-04-22 DIAGNOSIS — I2510 Atherosclerotic heart disease of native coronary artery without angina pectoris: Secondary | ICD-10-CM | POA: Diagnosis not present

## 2023-04-22 DIAGNOSIS — I130 Hypertensive heart and chronic kidney disease with heart failure and stage 1 through stage 4 chronic kidney disease, or unspecified chronic kidney disease: Secondary | ICD-10-CM | POA: Diagnosis not present

## 2023-04-22 NOTE — Telephone Encounter (Signed)
Prescription for rollator and shower chair successfully faxed to [facility] on 04/21/2023.

## 2023-04-22 NOTE — Telephone Encounter (Signed)
[facility] order for the rollator and shower chair successfully faxed back to [facility] on 04/21/2023.

## 2023-04-23 ENCOUNTER — Other Ambulatory Visit: Payer: Self-pay

## 2023-04-23 ENCOUNTER — Other Ambulatory Visit (HOSPITAL_COMMUNITY): Payer: Self-pay

## 2023-04-23 ENCOUNTER — Ambulatory Visit: Payer: Medicare HMO | Attending: Cardiology | Admitting: Cardiology

## 2023-04-23 ENCOUNTER — Encounter: Payer: Self-pay | Admitting: Cardiology

## 2023-04-23 VITALS — BP 122/72 | HR 89 | Ht 63.0 in | Wt 169.0 lb

## 2023-04-23 DIAGNOSIS — I272 Pulmonary hypertension, unspecified: Secondary | ICD-10-CM | POA: Diagnosis not present

## 2023-04-23 DIAGNOSIS — Z951 Presence of aortocoronary bypass graft: Secondary | ICD-10-CM

## 2023-04-23 DIAGNOSIS — E785 Hyperlipidemia, unspecified: Secondary | ICD-10-CM

## 2023-04-23 DIAGNOSIS — I2510 Atherosclerotic heart disease of native coronary artery without angina pectoris: Secondary | ICD-10-CM

## 2023-04-23 DIAGNOSIS — I959 Hypotension, unspecified: Secondary | ICD-10-CM

## 2023-04-23 DIAGNOSIS — I5032 Chronic diastolic (congestive) heart failure: Secondary | ICD-10-CM | POA: Diagnosis not present

## 2023-04-23 DIAGNOSIS — I4819 Other persistent atrial fibrillation: Secondary | ICD-10-CM | POA: Diagnosis not present

## 2023-04-23 MED ORDER — TORSEMIDE 20 MG PO TABS
ORAL_TABLET | ORAL | 3 refills | Status: DC
  Filled 2023-04-23 (×2): qty 90, 45d supply, fill #0
  Filled 2023-08-05: qty 90, 45d supply, fill #1
  Filled 2023-10-07: qty 90, 45d supply, fill #2

## 2023-04-23 MED ORDER — EMPAGLIFLOZIN 10 MG PO TABS
10.0000 mg | ORAL_TABLET | Freq: Every day | ORAL | 3 refills | Status: DC
  Filled 2023-04-23 (×2): qty 90, 90d supply, fill #0

## 2023-04-23 NOTE — Patient Instructions (Signed)
Medication Instructions:  Stop Lasix Start Jardiance 10 mg a day  Take Torsemide as 20 mg (a tablet) once a day with an extra 20 mg as needed for sudden weight gain 3 lbs overnight and 5 lbs over a week or for swelling in the legs. *If you need a refill on your cardiac medications before your next appointment, please call your pharmacy*   Lab Work: Today BMP will be drawn 2 weeks BMP will need to be drawn. If you have labs (blood work) drawn today and your tests are completely normal, you will receive your results only by: MyChart Message (if you have MyChart) OR A paper copy in the mail If you have any lab test that is abnormal or we need to change your treatment, we will call you to review the results.   Testing/Procedures: No testing   Follow-Up: At [facility], you and your health needs are our priority.  As part of our continuing mission to provide you with exceptional heart care, we have created designated Provider Care Teams.  These Care Teams include your primary Cardiologist (physician) and Advanced Practice Providers (APPs -  Physician Assistants and Nurse Practitioners) who all work together to provide you with the care you need, when you need it.  We recommend signing up for the patient portal called "MyChart".  Sign up information is provided on this After Visit Summary.  MyChart is used to connect with patients for Virtual Visits (Telemedicine).  Patients are able to view lab/test results, encounter notes, upcoming appointments, etc.  Non-urgent messages can be sent to your provider as well.   To learn more about what you can do with MyChart, go to [email or web address].    Your next appointment:   6-8 week(s)  Provider:   Any APP

## 2023-04-24 ENCOUNTER — Other Ambulatory Visit: Payer: Self-pay

## 2023-04-24 ENCOUNTER — Telehealth: Payer: Self-pay

## 2023-04-24 DIAGNOSIS — E1122 Type 2 diabetes mellitus with diabetic chronic kidney disease: Secondary | ICD-10-CM | POA: Diagnosis not present

## 2023-04-24 DIAGNOSIS — I495 Sick sinus syndrome: Secondary | ICD-10-CM | POA: Diagnosis not present

## 2023-04-24 DIAGNOSIS — I5032 Chronic diastolic (congestive) heart failure: Secondary | ICD-10-CM | POA: Diagnosis not present

## 2023-04-24 DIAGNOSIS — I480 Paroxysmal atrial fibrillation: Secondary | ICD-10-CM | POA: Diagnosis not present

## 2023-04-24 DIAGNOSIS — I051 Rheumatic mitral insufficiency: Secondary | ICD-10-CM | POA: Diagnosis not present

## 2023-04-24 DIAGNOSIS — Z48812 Encounter for surgical aftercare following surgery on the circulatory system: Secondary | ICD-10-CM | POA: Diagnosis not present

## 2023-04-24 DIAGNOSIS — N1832 Chronic kidney disease, stage 3b: Secondary | ICD-10-CM | POA: Diagnosis not present

## 2023-04-24 DIAGNOSIS — I2510 Atherosclerotic heart disease of native coronary artery without angina pectoris: Secondary | ICD-10-CM | POA: Diagnosis not present

## 2023-04-24 DIAGNOSIS — I130 Hypertensive heart and chronic kidney disease with heart failure and stage 1 through stage 4 chronic kidney disease, or unspecified chronic kidney disease: Secondary | ICD-10-CM | POA: Diagnosis not present

## 2023-04-24 LAB — BASIC METABOLIC PANEL
BUN/Creatinine Ratio: 32 — ABNORMAL HIGH (ref 12–28)
BUN: 37 mg/dL — ABNORMAL HIGH (ref 8–27)
CO2: 25 mmol/L (ref 20–29)
Calcium: 10.3 mg/dL (ref 8.7–10.3)
Chloride: 99 mmol/L (ref 96–106)
Creatinine, Ser: 1.16 mg/dL — ABNORMAL HIGH (ref 0.57–1.00)
Glucose: 224 mg/dL — ABNORMAL HIGH (ref 70–99)
Potassium: 4.6 mmol/L (ref 3.5–5.2)
Sodium: 139 mmol/L (ref 134–144)
eGFR: 52 mL/min/{1.73_m2} — ABNORMAL LOW (ref >59)

## 2023-04-24 NOTE — Telephone Encounter (Signed)
-----   Message from Jonita Albee sent at 04/24/2023  6:55 AM EST ----- Please tell patient that their lab work from yesterday showed stable kidney function and electrolytes on their current medications. Note, blood sugar was elevated at the time of our lab draw, they should make sure to monitor blood sugar at home and follow up with PCP as needed.   She should still come in for BMP in 2 weeks after starting jardiance

## 2023-04-24 NOTE — Telephone Encounter (Signed)
Left message to call back  

## 2023-04-27 DIAGNOSIS — I480 Paroxysmal atrial fibrillation: Secondary | ICD-10-CM | POA: Diagnosis not present

## 2023-04-27 DIAGNOSIS — I5032 Chronic diastolic (congestive) heart failure: Secondary | ICD-10-CM | POA: Diagnosis not present

## 2023-04-27 DIAGNOSIS — H35033 Hypertensive retinopathy, bilateral: Secondary | ICD-10-CM | POA: Diagnosis not present

## 2023-04-27 DIAGNOSIS — I2510 Atherosclerotic heart disease of native coronary artery without angina pectoris: Secondary | ICD-10-CM | POA: Diagnosis not present

## 2023-04-27 DIAGNOSIS — I130 Hypertensive heart and chronic kidney disease with heart failure and stage 1 through stage 4 chronic kidney disease, or unspecified chronic kidney disease: Secondary | ICD-10-CM | POA: Diagnosis not present

## 2023-04-27 DIAGNOSIS — E113511 Type 2 diabetes mellitus with proliferative diabetic retinopathy with macular edema, right eye: Secondary | ICD-10-CM | POA: Diagnosis not present

## 2023-04-27 DIAGNOSIS — N1832 Chronic kidney disease, stage 3b: Secondary | ICD-10-CM | POA: Diagnosis not present

## 2023-04-27 DIAGNOSIS — Z48812 Encounter for surgical aftercare following surgery on the circulatory system: Secondary | ICD-10-CM | POA: Diagnosis not present

## 2023-04-27 DIAGNOSIS — E1122 Type 2 diabetes mellitus with diabetic chronic kidney disease: Secondary | ICD-10-CM | POA: Diagnosis not present

## 2023-04-27 DIAGNOSIS — H02833 Dermatochalasis of right eye, unspecified eyelid: Secondary | ICD-10-CM | POA: Diagnosis not present

## 2023-04-27 DIAGNOSIS — I051 Rheumatic mitral insufficiency: Secondary | ICD-10-CM | POA: Diagnosis not present

## 2023-04-27 DIAGNOSIS — I495 Sick sinus syndrome: Secondary | ICD-10-CM | POA: Diagnosis not present

## 2023-04-27 DIAGNOSIS — H02836 Dermatochalasis of left eye, unspecified eyelid: Secondary | ICD-10-CM | POA: Diagnosis not present

## 2023-04-27 LAB — HM DIABETES EYE EXAM

## 2023-04-27 NOTE — Telephone Encounter (Signed)
Left message to call back  

## 2023-04-28 ENCOUNTER — Other Ambulatory Visit: Payer: Self-pay

## 2023-04-28 ENCOUNTER — Other Ambulatory Visit (HOSPITAL_COMMUNITY): Payer: Self-pay

## 2023-04-28 DIAGNOSIS — I051 Rheumatic mitral insufficiency: Secondary | ICD-10-CM | POA: Diagnosis not present

## 2023-04-28 DIAGNOSIS — I480 Paroxysmal atrial fibrillation: Secondary | ICD-10-CM | POA: Diagnosis not present

## 2023-04-28 DIAGNOSIS — I495 Sick sinus syndrome: Secondary | ICD-10-CM | POA: Diagnosis not present

## 2023-04-28 DIAGNOSIS — I2510 Atherosclerotic heart disease of native coronary artery without angina pectoris: Secondary | ICD-10-CM | POA: Diagnosis not present

## 2023-04-28 DIAGNOSIS — N1832 Chronic kidney disease, stage 3b: Secondary | ICD-10-CM | POA: Diagnosis not present

## 2023-04-28 DIAGNOSIS — Z48812 Encounter for surgical aftercare following surgery on the circulatory system: Secondary | ICD-10-CM | POA: Diagnosis not present

## 2023-04-28 DIAGNOSIS — I5032 Chronic diastolic (congestive) heart failure: Secondary | ICD-10-CM | POA: Diagnosis not present

## 2023-04-28 DIAGNOSIS — I130 Hypertensive heart and chronic kidney disease with heart failure and stage 1 through stage 4 chronic kidney disease, or unspecified chronic kidney disease: Secondary | ICD-10-CM | POA: Diagnosis not present

## 2023-04-28 DIAGNOSIS — E1122 Type 2 diabetes mellitus with diabetic chronic kidney disease: Secondary | ICD-10-CM | POA: Diagnosis not present

## 2023-04-29 ENCOUNTER — Telehealth: Payer: Self-pay | Admitting: Internal Medicine

## 2023-04-29 NOTE — Telephone Encounter (Signed)
Cassandra I am cc Erskine Squibb on this message because we spoke about this pt last wk and HH orders. WE can give ok for OT orders byt pt needs to keep upcoming appt with use 05/06/2023.  We would need to see her to document need for rollator walker.  Medical supply store will send back rxn asking for office note documenting need.  Hence importance of keeping appt next wk. Erskine Squibb was Ms. Hughley the pt whom I signed home P.T orders on for rollator walker and shower chair?

## 2023-04-29 NOTE — Telephone Encounter (Signed)
 Patient called back advised of below they verbalized understanding

## 2023-04-29 NOTE — Telephone Encounter (Signed)
Home Health Verbal Orders - Caller/Agency: [facility] Callback Number: [phone] Service Requested: Occupational Therapy Frequency:   1w1 1 visit every other week for 2 weeks 1w1 for re-certification   Any new concerns about the patient? Yes  The patient needs a rollator walker, insurance is not providing this because they said she did not call when she left the hospital. Efraim Kaufmann wants to know if PCP can intervene because the patient needs help, she just had open heart surgery and needs help with energy conservation/resting when she journeys to appts.

## 2023-04-30 ENCOUNTER — Encounter: Payer: Self-pay | Admitting: Internal Medicine

## 2023-04-30 DIAGNOSIS — E113512 Type 2 diabetes mellitus with proliferative diabetic retinopathy with macular edema, left eye: Secondary | ICD-10-CM | POA: Diagnosis not present

## 2023-04-30 NOTE — Telephone Encounter (Signed)
Spoke with Britta Mccreedy at  [employer]. Verified that shower chair has been delivered and rollator walker is in the process. Britta Mccreedy advised that she would follow-up and call us back with what is holding up the deliver of the rollator walker .

## 2023-05-02 DIAGNOSIS — Z48812 Encounter for surgical aftercare following surgery on the circulatory system: Secondary | ICD-10-CM | POA: Diagnosis not present

## 2023-05-02 DIAGNOSIS — I495 Sick sinus syndrome: Secondary | ICD-10-CM | POA: Diagnosis not present

## 2023-05-02 DIAGNOSIS — I130 Hypertensive heart and chronic kidney disease with heart failure and stage 1 through stage 4 chronic kidney disease, or unspecified chronic kidney disease: Secondary | ICD-10-CM | POA: Diagnosis not present

## 2023-05-02 DIAGNOSIS — I480 Paroxysmal atrial fibrillation: Secondary | ICD-10-CM | POA: Diagnosis not present

## 2023-05-02 DIAGNOSIS — I5032 Chronic diastolic (congestive) heart failure: Secondary | ICD-10-CM | POA: Diagnosis not present

## 2023-05-02 DIAGNOSIS — I2510 Atherosclerotic heart disease of native coronary artery without angina pectoris: Secondary | ICD-10-CM | POA: Diagnosis not present

## 2023-05-02 DIAGNOSIS — E1122 Type 2 diabetes mellitus with diabetic chronic kidney disease: Secondary | ICD-10-CM | POA: Diagnosis not present

## 2023-05-02 DIAGNOSIS — I051 Rheumatic mitral insufficiency: Secondary | ICD-10-CM | POA: Diagnosis not present

## 2023-05-02 DIAGNOSIS — N1832 Chronic kidney disease, stage 3b: Secondary | ICD-10-CM | POA: Diagnosis not present

## 2023-05-04 DIAGNOSIS — E1122 Type 2 diabetes mellitus with diabetic chronic kidney disease: Secondary | ICD-10-CM | POA: Diagnosis not present

## 2023-05-04 DIAGNOSIS — I480 Paroxysmal atrial fibrillation: Secondary | ICD-10-CM | POA: Diagnosis not present

## 2023-05-04 DIAGNOSIS — N1832 Chronic kidney disease, stage 3b: Secondary | ICD-10-CM | POA: Diagnosis not present

## 2023-05-04 DIAGNOSIS — I051 Rheumatic mitral insufficiency: Secondary | ICD-10-CM | POA: Diagnosis not present

## 2023-05-04 DIAGNOSIS — I495 Sick sinus syndrome: Secondary | ICD-10-CM | POA: Diagnosis not present

## 2023-05-04 DIAGNOSIS — I130 Hypertensive heart and chronic kidney disease with heart failure and stage 1 through stage 4 chronic kidney disease, or unspecified chronic kidney disease: Secondary | ICD-10-CM | POA: Diagnosis not present

## 2023-05-04 DIAGNOSIS — Z48812 Encounter for surgical aftercare following surgery on the circulatory system: Secondary | ICD-10-CM | POA: Diagnosis not present

## 2023-05-04 DIAGNOSIS — I2510 Atherosclerotic heart disease of native coronary artery without angina pectoris: Secondary | ICD-10-CM | POA: Diagnosis not present

## 2023-05-04 DIAGNOSIS — I5032 Chronic diastolic (congestive) heart failure: Secondary | ICD-10-CM | POA: Diagnosis not present

## 2023-05-05 ENCOUNTER — Telehealth: Payer: Self-pay

## 2023-05-05 ENCOUNTER — Other Ambulatory Visit: Payer: Self-pay

## 2023-05-05 DIAGNOSIS — I4819 Other persistent atrial fibrillation: Secondary | ICD-10-CM | POA: Diagnosis not present

## 2023-05-05 NOTE — Telephone Encounter (Signed)
I spoke to the patient and reminded her of her appointment tomorrow, 05/06/2023, at [facility] with Rose Phi Clung, PA at 1330.   She said she is aware and would be here.  I explained that it is very important that she be seen tomorrow in order for the provider to continue to authorize home health services.  She said she understood and would be here.  I asked if she has transportation and she said , yes and if there is a problem with the ride, she will call a cab.  I instructed her to call this clinic if she is not able to secure a ride and we will assist with her ride.  Again she said she will be at the appointment tomorrow.

## 2023-05-06 ENCOUNTER — Other Ambulatory Visit: Payer: Self-pay

## 2023-05-06 ENCOUNTER — Other Ambulatory Visit (HOSPITAL_COMMUNITY): Payer: Self-pay

## 2023-05-06 ENCOUNTER — Ambulatory Visit: Payer: Medicare HMO | Attending: Physician Assistant | Admitting: Physician Assistant

## 2023-05-06 ENCOUNTER — Telehealth: Payer: Self-pay

## 2023-05-06 ENCOUNTER — Encounter: Payer: Self-pay | Admitting: Physician Assistant

## 2023-05-06 VITALS — BP 126/87 | HR 87 | Wt 163.2 lb

## 2023-05-06 DIAGNOSIS — Z794 Long term (current) use of insulin: Secondary | ICD-10-CM

## 2023-05-06 DIAGNOSIS — Z139 Encounter for screening, unspecified: Secondary | ICD-10-CM | POA: Diagnosis not present

## 2023-05-06 DIAGNOSIS — I25110 Atherosclerotic heart disease of native coronary artery with unstable angina pectoris: Secondary | ICD-10-CM | POA: Diagnosis not present

## 2023-05-06 DIAGNOSIS — Z09 Encounter for follow-up examination after completed treatment for conditions other than malignant neoplasm: Secondary | ICD-10-CM

## 2023-05-06 DIAGNOSIS — N1831 Chronic kidney disease, stage 3a: Secondary | ICD-10-CM | POA: Diagnosis not present

## 2023-05-06 DIAGNOSIS — E1165 Type 2 diabetes mellitus with hyperglycemia: Secondary | ICD-10-CM

## 2023-05-06 DIAGNOSIS — Z79899 Other long term (current) drug therapy: Secondary | ICD-10-CM

## 2023-05-06 LAB — BASIC METABOLIC PANEL
BUN/Creatinine Ratio: 45 — ABNORMAL HIGH (ref 12–28)
BUN: 82 mg/dL (ref 8–27)
CO2: 30 mmol/L — ABNORMAL HIGH (ref 20–29)
Calcium: 10 mg/dL (ref 8.7–10.3)
Chloride: 92 mmol/L — ABNORMAL LOW (ref 96–106)
Creatinine, Ser: 1.83 mg/dL — ABNORMAL HIGH (ref 0.57–1.00)
Glucose: 126 mg/dL — ABNORMAL HIGH (ref 70–99)
Potassium: 3.9 mmol/L (ref 3.5–5.2)
Sodium: 139 mmol/L (ref 134–144)
eGFR: 30 mL/min/{1.73_m2} — ABNORMAL LOW (ref >59)

## 2023-05-06 LAB — GLUCOSE, POCT (MANUAL RESULT ENTRY): POC Glucose: 206 mg/dL — AB (ref 70–99)

## 2023-05-06 MED ORDER — INSULIN PEN NEEDLE 32G X 4 MM MISC
1.0000 | Freq: Four times a day (QID) | 3 refills | Status: DC
  Filled 2023-05-06 (×2): qty 300, 75d supply, fill #0
  Filled 2023-05-06: qty 100, 25d supply, fill #0

## 2023-05-06 MED ORDER — ACCU-CHEK SOFTCLIX LANCETS MISC
3 refills | Status: DC
  Filled 2023-05-06: qty 100, 33d supply, fill #0
  Filled 2023-06-26 (×2): qty 100, 33d supply, fill #1
  Filled 2023-06-26: qty 100, 33d supply, fill #0
  Filled 2023-07-31: qty 100, 33d supply, fill #1
  Filled 2023-08-31: qty 100, 33d supply, fill #2

## 2023-05-06 MED ORDER — TRUE METRIX BLOOD GLUCOSE TEST VI STRP
ORAL_STRIP | 12 refills | Status: DC
  Filled 2023-05-06: qty 100, fill #0
  Filled 2023-06-03 (×2): qty 100, 30d supply, fill #0
  Filled 2023-06-03: qty 100, 33d supply, fill #0
  Filled 2023-07-15: qty 100, 30d supply, fill #1
  Filled 2023-08-17: qty 100, 30d supply, fill #2
  Filled 2023-09-18: qty 100, 30d supply, fill #3
  Filled 2023-10-16: qty 100, 30d supply, fill #4
  Filled 2023-11-13: qty 100, 30d supply, fill #5
  Filled 2023-12-08: qty 100, 30d supply, fill #6
  Filled 2024-04-06: qty 100, 30d supply, fill #7

## 2023-05-06 NOTE — Progress Notes (Signed)
Patient ID: Emily Farmer, female   DOB: 1955/07/11, 67 y.o.   MRN: [id number]    Emily Farmer, is a 67 y.o. female  EHU:[id number]  ZCH:[id number]  DOB - July 04, 1955  Chief Complaint  Patient presents with   Hospitalization Follow-up       Subjective:   Emily Farmer is a 67 y.o. female here today for a follow up visit Hospitalization 9/15-10/07/2022 then Seen in ED for CP 10/28 and saw cardiology in fup on 11/07.  She is taking lantus 10u, humalog 5 u tid, and jardiance.  Cardiology took her off of daily torsemide  and she will only take it prn due to BUN=82, Cr=1.83 yesterday believed to be due to over-diuresing.  She will be getting BMP again next week.  She denies any new issues or concerns.  No CP/SOB.  No edema today.  She does need RF on test strips, lancets, and pen needles  From Cardiology A/P 04/23/2023:  From A/P: CAD S/P CABG  - Patient recently underwent 3V CABG with SVG to LAD, SVG to PL, SVG to PDA - Denies chest pain. Working with physical therapy and feels like she is making good progress. Sometimes gets a bit short of breath on exertion if she walks too fast, but otherwise breathing is stable. Gets tired easily  - Patient has follow up later this month with CT surgery. On my examination, her surgical incisions are healing well, no bleeding or dehiscence  - Continue ASA 81 mg daily  - Continue lipitor 40 mg daily  - Not on BB due to low BP    Chronic Diastolic Heart Failure  Pulmonary hypertension -Most recent echocardiogram from 03/02/2023 showed EF 60-65%, no regional wall motion abnormalities, mildly reduced RV function, severely elevated pulmonary artery systolic pressure.  After diuresis, right heart catheterization on 03/06/2023 showed mild primarily pulmonary arterial hypertension - Currently on torsemide 20 mg daily. When seen in the ED on 10/28, she was also given 5 days of lasix.  - Stop lasix  - On exam, lungs are clear and she only has trace lower  extremity edema, more noticeable in R leg (had veins taken from right left for CABG). Denies orthopnea, cough. Only gets short of breath on heavy exertion  - Continue torsemide 20 mg daily Instructed patient to take an additional 20 mg torsemide as needed for ankle swelling, weight gain >3 lbs in one day or 5 lbs in one week - Start jardiance 10 mg daily - patient denies UTIs. She has type 2 DM.  - Ordered BMP today and a second BMP in 2 weeks    Hypotension  - After CABG, patient became hypotensive and was discharged on midodrine 5 mg BID. Now off midodrine   - BP stable. Denies dizziness, syncope, near syncope    Permanent Afib s/p AV nodal ablation and PPM  - Underwent AV nodal ablation and PPM implantation on 01/02/23 - Denies palpitations  - Device followed by Dr. Ladona Ridgel  - Continue eliquis 5 mg BID    HLD  - Lipid panel from 02/2023 showed LDL 32  - Continue lipitor 40 mg daily  No problems updated.  ALLERGIES: Allergies  Allergen Reactions   Trulicity [Dulaglutide] Diarrhea    PAST MEDICAL HISTORY: Past Medical History:  Diagnosis Date   Arthritis    Atrial fibrillation (HCC)    a. 09/2016 in setting of pancreatitis;  b. 09/2016 Echo: EF 65-60%, no rwma, Gr2 DD, mild MR, triv TR, PASP ;  CHA2DS2VASc =  4-->Eliquis 5mg  BID. c. Recurred 06/2018 in setting of GIB.   Chronic diastolic CHF (congestive heart failure) (HCC) 01/28/2018   Diabetes mellitus    GI bleeding 06/2018   Gout    Hyperlipidemia    Hypertension    Hypertriglyceridemia    Pancreatitis    a. 09/2016 - Triglycerides 1,392 on admission.    MEDICATIONS AT HOME: Prior to Admission medications   Medication Sig Start Date End Date Taking? Authorizing Provider  acetaminophen (TYLENOL) 325 MG tablet Take 2 tablets (650 mg total) by mouth every 6 (six) hours as needed for mild pain (or Fever >/= 101). 02/01/18  Yes Emily Farmer, Emily Roe, MD  apixaban (ELIQUIS) 5 MG TABS tablet Take 1 tablet (5 mg total) by mouth  in the morning and at bedtime. 11/07/22  Yes Marcine Matar, MD  aspirin EC 81 MG tablet Take 1 tablet (81 mg total) by mouth daily. Swallow whole. 03/18/23  Yes Barrett, Erin R, PA-C  atorvastatin (LIPITOR) 40 MG tablet Take 1 tablet (40 mg total) by mouth daily. 06/23/22  Yes Marcine Matar, MD  Blood Glucose Monitoring Suppl (ACCU-CHEK GUIDE) w/Device KIT Use to check blood sugar 3 times daily. 01/13/23  Yes Marcine Matar, MD  Blood Pressure Monitoring (BLOOD PRESSURE MONITOR/ARM) DEVI Check blood pressure once per day at least 2 hours after medications. 07/22/21  Yes Alver Sorrow, NP  Continuous Blood Gluc Sensor (DEXCOM G7 SENSOR) MISC 1 Device by Does not apply route as directed. 08/22/22  Yes Shamleffer, Konrad Dolores, MD  diclofenac Sodium (VOLTAREN) 1 % GEL Apply 2 g topically 4 (four) times daily. Apply across lower back for back pain Patient taking differently: Apply 2 g topically 4 (four) times daily as needed (pain). Apply across lower back for back pain 11/18/22  Yes Marcine Matar, MD  empagliflozin (JARDIANCE) 10 MG TABS tablet Take 1 tablet (10 mg total) by mouth daily. 04/23/23  Yes Emily Albee, PA-C  fenofibrate (TRICOR) 48 MG tablet Take 1 tablet (48 mg total) by mouth daily. 11/19/22  Yes Marcine Matar, MD  ferrous sulfate 325 (65 FE) MG tablet Take 1 tablet (325 mg total) by mouth daily with breakfast. 04/11/21 04/12/24 Yes Aijah Lattner, Marzella Schlein, PA-C  glucose blood (TRUE METRIX BLOOD GLUCOSE TEST) test strip Use as instructed 05/06/23  Yes Emily Strollo M, PA-C  insulin glargine (LANTUS SOLOSTAR) 100 UNIT/ML Solostar Pen Inject 55 Units into the skin daily. Patient taking differently: Inject 10 Units into the skin daily. 11/18/22  Yes Marcine Matar, MD  insulin lispro (HUMALOG KWIKPEN) 100 UNIT/ML KwikPen Max daily 45 units Patient taking differently: Inject 5 Units into the skin with breakfast, with lunch, and with evening meal. 08/22/22  Yes Shamleffer,  Konrad Dolores, MD  Insulin Syringe-Needle U-100 (TRUEPLUS INSULIN SYRINGE) 31G X 5/16" 0.3 ML MISC Use to inject insulin daily. Patient taking differently: 1 each by Other route See admin instructions. Use to inject insulin daily. 05/06/18  Yes Fulp, Cammie, MD  liraglutide (VICTOZA) 18 MG/3ML SOPN Inject 1.8 mg into the skin daily. 08/22/22  Yes Shamleffer, Konrad Dolores, MD  Multiple Vitamins-Minerals (CENTRUM SILVER PO) Take 1 tablet by mouth daily.   Yes [provider]  Multiple Vitamins-Minerals (OCUVITE ADULT 50+) CAPS Take 1 capsule by mouth daily.   Yes [provider]  Accu-Chek Softclix Lancets lancets Use to check blood sugar 3 times daily. 05/06/23   Emily Simmonds, PA-C  Insulin Pen Needle 32G X 4  MM MISC use in the morning, at noon, in the evening, and at bedtime. 05/06/23   Emily Simmonds, PA-C  pioglitazone (ACTOS) 15 MG tablet Take 1 tablet (15 mg total) by mouth daily. Patient not taking: Reported on 05/06/2023 08/22/22   Shamleffer, Konrad Dolores, MD  torsemide (DEMADEX) 20 MG tablet Take 1 tablet (20 mg) once a day, Take extra dose of 20 mg as needed for sudden weight gain and leg swelling Patient not taking: Reported on 05/06/2023 04/23/23   Emily Albee, PA-C    ROS: Neg HEENT Neg resp Neg cardiac Neg GI Neg GU Neg MS Neg psych Neg neuro  Objective:   Vitals:   05/06/23 1332  BP: 126/87  Pulse: 87  SpO2: 92%  Weight: 163 lb 3.2 oz (74 kg)   Exam General appearance : Awake, alert, not in any distress. Speech Clear. Not toxic looking HEENT: Atraumatic and Normocephalic Neck: Supple, no JVD. No cervical lymphadenopathy.  Chest: Good air entry bilaterally, CTAB.  No rales/rhonchi/wheezing CVS: S1 S2 regular, no murmurs.  Extremities: B/L Lower Ext shows trace edema, both legs are warm to touch Neurology: Awake alert, and oriented X 3, CN II-XII intact, Non focal Skin: No Rash  Data Review Lab Results  Component Value Date    HGBA1C 8.0 (H) 03/04/2023   HGBA1C 7.8 (A) 01/13/2023   HGBA1C 10.2 (A) 08/22/2022    Assessment & Plan   1. Type 2 diabetes mellitus with hyperglycemia, with long-term current use of insulin (HCC) Continue jardiance lantus and humalog as now taking it.  A1C next month.  Encouraged diabetic diet - Glucose (CBG) - Insulin Pen Needle 32G X 4 MM MISC; use in the morning, at noon, in the evening, and at bedtime.  Dispense: 400 each; Refill: 3 - Accu-Chek Softclix Lancets lancets; Use to check blood sugar 3 times daily.  Dispense: 100 each; Refill: 3 - glucose blood (TRUE METRIX BLOOD GLUCOSE TEST) test strip; Use as instructed  Dispense: 100 each; Refill: 12  2. Encounter for screening involving social determinants of health (SDoH) - AMB Referral VBCI Care Management  3. Coronary artery disease involving native coronary artery of native heart with unstable angina pectoris (HCC) Followed by cardiology  4. CKD stage 3a, GFR 45-59 ml/min [facility]) Cardiology ordered BMP for next week. Hold torsemide per cardiology guidelines given yesterday(she is able to verbalize how to take it per lab result notes.)  5. Hospital discharge follow-up     Return in about 3 months (around 08/06/2023) for PCP for chronic conditions-Dr Laural Benes.  The patient was given clear instructions to go to ER or return to medical center if symptoms don't improve, worsen or new problems develop. The patient verbalized understanding. The patient was told to call to get lab results if they haven't heard anything in the next week.      Georgian Co, PA-C [facility] [place], [place] [phone]   05/06/2023, 1:46 PM

## 2023-05-06 NOTE — Telephone Encounter (Signed)
Called patient advised of below they verbalized understanding.

## 2023-05-06 NOTE — Addendum Note (Signed)
Addended by: Clotilde Dieter on: 05/06/2023 12:49 PM   Modules accepted: Orders

## 2023-05-06 NOTE — Telephone Encounter (Signed)
-----   Message from Jonita Albee sent at 05/06/2023  8:07 AM EST ----- Please tell patient that her creatinine and BUN are elevated (indicating some degree of kidney damage). I suspect that this is because she is over-diuresed (dehydrated) on the combination of torsemide and jardiance. The jardiance is a good medication for her to be on with her history of type 2 DM and her diastolic heart failure. She should stop her daily torsemide. Continue to take torsemide 20 mg as needed for weight gain >3 lbs in one night, 5 lbs in one week, ankle swelling.   Repeat BMP in 1 week  Thanks KJ

## 2023-05-07 ENCOUNTER — Other Ambulatory Visit: Payer: Self-pay

## 2023-05-07 ENCOUNTER — Telehealth: Payer: Self-pay | Admitting: *Deleted

## 2023-05-07 NOTE — Progress Notes (Signed)
  Care Coordination   Note   05/07/2023 Name: Emily Farmer MRN: [id number] DOB: 14-Apr-1956  Emily Farmer is a 67 y.o. year old female who sees Marcine Matar, MD for primary care. I reached out to [name] by phone today to offer care coordination services.  [name] was given information about Care Coordination services today including:   The Care Coordination services include support from the care team which includes your Nurse Coordinator, Clinical Social Worker, or Pharmacist.  The Care Coordination team is here to help remove barriers to the health concerns and goals most important to you. Care Coordination services are voluntary, and the patient may decline or stop services at any time by request to their care team member.   Care Coordination Consent Status: Patient agreed to services and verbal consent obtained.   Follow up plan:  Telephone appointment with care coordination team member scheduled for:  05/20/23  Encounter Outcome:  Patient Scheduled  [facility] Coordination Care Guide  Direct Dial: [phone]

## 2023-05-08 ENCOUNTER — Other Ambulatory Visit: Payer: Self-pay | Admitting: Internal Medicine

## 2023-05-08 DIAGNOSIS — Z0000 Encounter for general adult medical examination without abnormal findings: Secondary | ICD-10-CM

## 2023-05-11 ENCOUNTER — Other Ambulatory Visit (HOSPITAL_COMMUNITY): Payer: Self-pay

## 2023-05-11 ENCOUNTER — Other Ambulatory Visit: Payer: Self-pay

## 2023-05-11 DIAGNOSIS — I130 Hypertensive heart and chronic kidney disease with heart failure and stage 1 through stage 4 chronic kidney disease, or unspecified chronic kidney disease: Secondary | ICD-10-CM | POA: Diagnosis not present

## 2023-05-11 DIAGNOSIS — I2510 Atherosclerotic heart disease of native coronary artery without angina pectoris: Secondary | ICD-10-CM | POA: Diagnosis not present

## 2023-05-11 DIAGNOSIS — I495 Sick sinus syndrome: Secondary | ICD-10-CM | POA: Diagnosis not present

## 2023-05-11 DIAGNOSIS — I480 Paroxysmal atrial fibrillation: Secondary | ICD-10-CM | POA: Diagnosis not present

## 2023-05-11 DIAGNOSIS — I5032 Chronic diastolic (congestive) heart failure: Secondary | ICD-10-CM | POA: Diagnosis not present

## 2023-05-11 DIAGNOSIS — I051 Rheumatic mitral insufficiency: Secondary | ICD-10-CM | POA: Diagnosis not present

## 2023-05-11 DIAGNOSIS — E1122 Type 2 diabetes mellitus with diabetic chronic kidney disease: Secondary | ICD-10-CM | POA: Diagnosis not present

## 2023-05-11 DIAGNOSIS — N1832 Chronic kidney disease, stage 3b: Secondary | ICD-10-CM | POA: Diagnosis not present

## 2023-05-11 DIAGNOSIS — Z48812 Encounter for surgical aftercare following surgery on the circulatory system: Secondary | ICD-10-CM | POA: Diagnosis not present

## 2023-05-12 ENCOUNTER — Other Ambulatory Visit: Payer: Self-pay

## 2023-05-12 DIAGNOSIS — I130 Hypertensive heart and chronic kidney disease with heart failure and stage 1 through stage 4 chronic kidney disease, or unspecified chronic kidney disease: Secondary | ICD-10-CM | POA: Diagnosis not present

## 2023-05-12 DIAGNOSIS — I5032 Chronic diastolic (congestive) heart failure: Secondary | ICD-10-CM | POA: Diagnosis not present

## 2023-05-12 DIAGNOSIS — I2510 Atherosclerotic heart disease of native coronary artery without angina pectoris: Secondary | ICD-10-CM | POA: Diagnosis not present

## 2023-05-12 DIAGNOSIS — I480 Paroxysmal atrial fibrillation: Secondary | ICD-10-CM | POA: Diagnosis not present

## 2023-05-12 DIAGNOSIS — I051 Rheumatic mitral insufficiency: Secondary | ICD-10-CM | POA: Diagnosis not present

## 2023-05-12 DIAGNOSIS — Z48812 Encounter for surgical aftercare following surgery on the circulatory system: Secondary | ICD-10-CM | POA: Diagnosis not present

## 2023-05-12 DIAGNOSIS — N1832 Chronic kidney disease, stage 3b: Secondary | ICD-10-CM | POA: Diagnosis not present

## 2023-05-12 DIAGNOSIS — I495 Sick sinus syndrome: Secondary | ICD-10-CM | POA: Diagnosis not present

## 2023-05-12 DIAGNOSIS — E1122 Type 2 diabetes mellitus with diabetic chronic kidney disease: Secondary | ICD-10-CM | POA: Diagnosis not present

## 2023-05-13 ENCOUNTER — Emergency Department (HOSPITAL_COMMUNITY): Payer: Medicare HMO

## 2023-05-13 ENCOUNTER — Telehealth: Payer: Self-pay

## 2023-05-13 ENCOUNTER — Other Ambulatory Visit: Payer: Self-pay

## 2023-05-13 ENCOUNTER — Encounter: Payer: Self-pay | Admitting: Surgery

## 2023-05-13 ENCOUNTER — Emergency Department (HOSPITAL_COMMUNITY)
Admission: EM | Admit: 2023-05-13 | Discharge: 2023-05-14 | Disposition: A | Discharge status: Home / Self Care | Payer: Medicare HMO | Attending: Emergency Medicine | Admitting: Emergency Medicine

## 2023-05-13 ENCOUNTER — Ambulatory Visit (INDEPENDENT_AMBULATORY_CARE_PROVIDER_SITE_OTHER): Payer: Self-pay | Admitting: Surgery

## 2023-05-13 VITALS — BP 93/63 | HR 96 | Resp 20 | Ht 63.0 in | Wt 163.0 lb

## 2023-05-13 DIAGNOSIS — R7981 Abnormal blood-gas level: Secondary | ICD-10-CM | POA: Insufficient documentation

## 2023-05-13 DIAGNOSIS — R079 Chest pain, unspecified: Secondary | ICD-10-CM | POA: Diagnosis not present

## 2023-05-13 DIAGNOSIS — I509 Heart failure, unspecified: Secondary | ICD-10-CM | POA: Insufficient documentation

## 2023-05-13 DIAGNOSIS — Z951 Presence of aortocoronary bypass graft: Secondary | ICD-10-CM | POA: Insufficient documentation

## 2023-05-13 DIAGNOSIS — Z7901 Long term (current) use of anticoagulants: Secondary | ICD-10-CM | POA: Diagnosis not present

## 2023-05-13 DIAGNOSIS — R000 Tachycardia, unspecified: Secondary | ICD-10-CM | POA: Diagnosis not present

## 2023-05-13 DIAGNOSIS — I2510 Atherosclerotic heart disease of native coronary artery without angina pectoris: Secondary | ICD-10-CM | POA: Diagnosis not present

## 2023-05-13 DIAGNOSIS — R0789 Other chest pain: Secondary | ICD-10-CM | POA: Insufficient documentation

## 2023-05-13 DIAGNOSIS — Z950 Presence of cardiac pacemaker: Secondary | ICD-10-CM | POA: Insufficient documentation

## 2023-05-13 DIAGNOSIS — M549 Dorsalgia, unspecified: Secondary | ICD-10-CM | POA: Diagnosis not present

## 2023-05-13 DIAGNOSIS — R0689 Other abnormalities of breathing: Secondary | ICD-10-CM | POA: Diagnosis not present

## 2023-05-13 DIAGNOSIS — R918 Other nonspecific abnormal finding of lung field: Secondary | ICD-10-CM | POA: Diagnosis not present

## 2023-05-13 DIAGNOSIS — K5730 Diverticulosis of large intestine without perforation or abscess without bleeding: Secondary | ICD-10-CM | POA: Diagnosis not present

## 2023-05-13 DIAGNOSIS — K439 Ventral hernia without obstruction or gangrene: Secondary | ICD-10-CM | POA: Diagnosis not present

## 2023-05-13 DIAGNOSIS — Z7982 Long term (current) use of aspirin: Secondary | ICD-10-CM | POA: Insufficient documentation

## 2023-05-13 DIAGNOSIS — D649 Anemia, unspecified: Secondary | ICD-10-CM | POA: Insufficient documentation

## 2023-05-13 DIAGNOSIS — R7989 Other specified abnormal findings of blood chemistry: Secondary | ICD-10-CM | POA: Diagnosis not present

## 2023-05-13 DIAGNOSIS — I959 Hypotension, unspecified: Secondary | ICD-10-CM | POA: Diagnosis not present

## 2023-05-13 DIAGNOSIS — Z79899 Other long term (current) drug therapy: Secondary | ICD-10-CM | POA: Diagnosis not present

## 2023-05-13 LAB — CBC WITH DIFFERENTIAL/PLATELET
Abs Immature Granulocytes: 0.02 10*3/uL (ref 0.00–0.07)
Basophils Absolute: 0 10*3/uL (ref 0.0–0.1)
Basophils Relative: 0 %
Eosinophils Absolute: 0.6 10*3/uL — ABNORMAL HIGH (ref 0.0–0.5)
Eosinophils Relative: 6 %
HCT: 37.3 % (ref 36.0–46.0)
Hemoglobin: 11.6 g/dL — ABNORMAL LOW (ref 12.0–15.0)
Immature Granulocytes: 0 %
Lymphocytes Relative: 17 %
Lymphs Abs: 1.6 10*3/uL (ref 0.7–4.0)
MCH: 26.4 pg (ref 26.0–34.0)
MCHC: 31.1 g/dL (ref 30.0–36.0)
MCV: 84.8 fL (ref 80.0–100.0)
Monocytes Absolute: 0.8 10*3/uL (ref 0.1–1.0)
Monocytes Relative: 9 %
Neutro Abs: 6.3 10*3/uL (ref 1.7–7.7)
Neutrophils Relative %: 68 %
Platelets: 196 10*3/uL (ref 150–400)
RBC: 4.4 MIL/uL (ref 3.87–5.11)
RDW: 15.1 % (ref 11.5–15.5)
WBC: 9.4 10*3/uL (ref 4.0–10.5)
nRBC: 0 % (ref 0.0–0.2)

## 2023-05-13 LAB — COMPREHENSIVE METABOLIC PANEL
ALT: 14 U/L (ref 0–44)
AST: 23 U/L (ref 15–41)
Albumin: 3.6 g/dL (ref 3.5–5.0)
Alkaline Phosphatase: 44 U/L (ref 38–126)
Anion gap: 13 (ref 5–15)
BUN: 57 mg/dL — ABNORMAL HIGH (ref 8–23)
CO2: 19 mmol/L — ABNORMAL LOW (ref 22–32)
Calcium: 9.2 mg/dL (ref 8.9–10.3)
Chloride: 106 mmol/L (ref 98–111)
Creatinine, Ser: 1.48 mg/dL — ABNORMAL HIGH (ref 0.44–1.00)
GFR, Estimated: 39 mL/min — ABNORMAL LOW (ref >60)
Glucose, Bld: 106 mg/dL — ABNORMAL HIGH (ref 70–99)
Potassium: 3.9 mmol/L (ref 3.5–5.1)
Sodium: 138 mmol/L (ref 135–145)
Total Bilirubin: 0.4 mg/dL (ref <1.2)
Total Protein: 7 g/dL (ref 6.5–8.1)

## 2023-05-13 LAB — BASIC METABOLIC PANEL
BUN/Creatinine Ratio: 38 — ABNORMAL HIGH (ref 12–28)
BUN: 54 mg/dL — ABNORMAL HIGH (ref 8–27)
CO2: 21 mmol/L (ref 20–29)
Calcium: 9.3 mg/dL (ref 8.7–10.3)
Chloride: 100 mmol/L (ref 96–106)
Creatinine, Ser: 1.43 mg/dL — ABNORMAL HIGH (ref 0.57–1.00)
Glucose: 87 mg/dL (ref 70–99)
Potassium: 4 mmol/L (ref 3.5–5.2)
Sodium: 139 mmol/L (ref 134–144)
eGFR: 40 mL/min/{1.73_m2} — ABNORMAL LOW (ref >59)

## 2023-05-13 LAB — TROPONIN I (HIGH SENSITIVITY)
Troponin I (High Sensitivity): 18 ng/L — ABNORMAL HIGH (ref <18)
Troponin I (High Sensitivity): 18 ng/L — ABNORMAL HIGH (ref <18)

## 2023-05-13 LAB — LIPASE, BLOOD: Lipase: 83 U/L — ABNORMAL HIGH (ref 11–51)

## 2023-05-13 MED ORDER — IOHEXOL 350 MG/ML SOLN
100.0000 mL | Freq: Once | INTRAVENOUS | Status: AC | PRN
  Administered 2023-05-13: 100 mL via INTRAVENOUS

## 2023-05-13 MED ORDER — LIDOCAINE 5 % EX PTCH
1.0000 | MEDICATED_PATCH | CUTANEOUS | 0 refills | Status: DC
  Filled 2023-05-13 – 2023-05-15 (×2): qty 30, 30d supply, fill #0

## 2023-05-13 MED ORDER — SODIUM CHLORIDE 0.9 % IV BOLUS
1000.0000 mL | Freq: Once | INTRAVENOUS | Status: AC
  Administered 2023-05-13: 1000 mL via INTRAVENOUS

## 2023-05-13 NOTE — ED Triage Notes (Signed)
Pt presents to ED 34 via EMS from home with complaint of chest pain x 3 days.  States pain is on left side of chest and radiates to flank/back.  Pt seen by cardiology today; Bypass 1 month ago.  Pt took 324 mg Aspirin and 1 SL nitroglycerin at home.

## 2023-05-13 NOTE — Progress Notes (Signed)
HPI:  Patient returns for postoperative follow-up having undergone coronary bypass graft surgery x 3 on 03/09/2023. The patient's early postoperative recovery while in the hospital was notable for a slow postoperative course due to preoperative deconditioning.  She had postoperative vasoplegia and was started on midodrine.  This was tapered to 5 mg twice daily prior to discharge.  She was on Toprol XL 100 mg daily and Cardizem CD 120 mg daily on admission.  These were not started postoperatively due to hypotension.  I last saw her in the office on 04/08/2023 for postoperative follow-up and her blood pressure was 77/52 with a repeat of 121/82.  She said that she had been taking her Toprol-XL and Cardizem at home although she was told not to delay her discharge summary.  I discontinue her midodrine at that visit.  She said that she continues to feel well overall.  She did have an episode of left parasternal chest pain with point tenderness on 04/13/2023 and came to the emergency room where a CT scan showed no evidence of acute pulmonary embolus and no other significant abnormality.  She has not had any further episodes of that pain.    Current Outpatient Medications  Medication Sig Dispense Refill   Accu-Chek Softclix Lancets lancets Use to check blood sugar 3 times daily. 100 each 3   acetaminophen (TYLENOL) 325 MG tablet Take 2 tablets (650 mg total) by mouth every 6 (six) hours as needed for mild pain (or Fever >/= 101).     apixaban (ELIQUIS) 5 MG TABS tablet Take 1 tablet (5 mg total) by mouth in the morning and at bedtime. 180 tablet 1   aspirin EC 81 MG tablet Take 1 tablet (81 mg total) by mouth daily. Swallow whole.     atorvastatin (LIPITOR) 40 MG tablet Take 1 tablet (40 mg total) by mouth daily. 90 tablet 3   Blood Glucose Monitoring Suppl (ACCU-CHEK GUIDE) w/Device KIT Use to check blood sugar 3 times daily. 1 kit 0   Blood Pressure Monitoring (BLOOD PRESSURE MONITOR/ARM) DEVI Check  blood pressure once per day at least 2 hours after medications. 1 each 0   Continuous Blood Gluc Sensor (DEXCOM G7 SENSOR) MISC 1 Device by Does not apply route as directed. 9 each 3   diclofenac Sodium (VOLTAREN) 1 % GEL Apply 2 g topically 4 (four) times daily. Apply across lower back for back pain (Patient taking differently: Apply 2 g topically 4 (four) times daily as needed (pain). Apply across lower back for back pain) 100 g 0   empagliflozin (JARDIANCE) 10 MG TABS tablet Take 1 tablet (10 mg total) by mouth daily. 90 tablet 3   fenofibrate (TRICOR) 48 MG tablet Take 1 tablet (48 mg total) by mouth daily. 90 tablet 1   ferrous sulfate 325 (65 FE) MG tablet Take 1 tablet (325 mg total) by mouth daily with breakfast. 30 tablet 4   glucose blood (TRUE METRIX BLOOD GLUCOSE TEST) test strip Use as instructed 100 each 12   insulin glargine (LANTUS SOLOSTAR) 100 UNIT/ML Solostar Pen Inject 55 Units into the skin daily. (Patient taking differently: Inject 10 Units into the skin daily.) 45 mL 3   insulin lispro (HUMALOG KWIKPEN) 100 UNIT/ML KwikPen Max daily 45 units (Patient taking differently: Inject 5 Units into the skin with breakfast, with lunch, and with evening meal.) 45 mL 2   Insulin Pen Needle 32G X 4 MM MISC use in the morning, at noon, in the evening,  and at bedtime. 400 each 3   Insulin Syringe-Needle U-100 (TRUEPLUS INSULIN SYRINGE) 31G X 5/16" 0.3 ML MISC Use to inject insulin daily. (Patient taking differently: 1 each by Other route See admin instructions. Use to inject insulin daily.) 100 each 11   liraglutide (VICTOZA) 18 MG/3ML SOPN Inject 1.8 mg into the skin daily. 27 mL 3   Multiple Vitamins-Minerals (CENTRUM SILVER PO) Take 1 tablet by mouth daily.     Multiple Vitamins-Minerals (OCUVITE ADULT 50+) CAPS Take 1 capsule by mouth daily.     pioglitazone (ACTOS) 15 MG tablet Take 1 tablet (15 mg total) by mouth daily. 90 tablet 3   torsemide (DEMADEX) 20 MG tablet Take 1 tablet (20 mg)  once a day, Take extra dose of 20 mg as needed for sudden weight gain and leg swelling 90 tablet 3   No current facility-administered medications for this visit.     Physical Exam: BP 93/63   Pulse 96   Resp 20   Ht 5\' 3"  (1.6 m)   Wt 163 lb (73.9 kg)   SpO2 97%   BMI 28.87 kg/m  She looks well. Cardiac exam shows regular rate and rhythm with normal heart sounds.  There is no murmur. Lungs are clear. Chest incision is well-healed and the sternum is stable. There is mild bilateral ankle edema.  Diagnostic Tests: None today  Impression:  She is doing well a little over 2 months following her surgery.  I encouraged her to continue being as active as possible.  I do not think she will be able to return to work for 3 months postoperatively since she has to do a fair amount of lifting.  Her chest wall discomfort is parasternal and likely related to increased activity and coughing.  If she has recurrent or persistent point discomfort then we may consider removing a sternal wire to relieve that.  Plan:  She will continue to follow-up with her PCP and cardiology and will return to see me if she has any further problems with her chest incision or chest wall discomfort.   [name], MD [facility] [phone]

## 2023-05-13 NOTE — Telephone Encounter (Signed)
Copied from CRM [id number]. Topic: General - Other >> May 13, 2023  8:07 AM Emily Farmer wrote: Reason for CRM: The patient has called to share that they have checked their weight this morning and it was 168.4 lbs  Please contact the patient further if needed

## 2023-05-13 NOTE — ED Provider Notes (Signed)
EMERGENCY DEPARTMENT AT [facility] Provider Note   CSN: [id number] Arrival date & time: 05/13/23  2034     History  Chief Complaint  Patient presents with   Chest Pain    Emily Farmer is a 67 y.o. female past medical history significant for A-fib, CABG, hypokalemia, CHF, CAD, pacemaker, and pancreatitis presents today for chest pain x 3 days.  Patient states the pain is on the left side of her chest and radiates to her flank/back.  Patient denies hematuria, burning, frequency, urgency, shortness of breath, headache, dizziness, or fever.  Patient states that she took nitro and ASA prior to arrival which did not help.  Patient denies tobacco, alcohol, or recreational drug use.   Chest Pain      Home Medications Prior to Admission medications   Medication Sig Start Date End Date Taking? Authorizing Provider  Accu-Chek Softclix Lancets lancets Use to check blood sugar 3 times daily. 05/06/23   Anders Simmonds, PA-C  acetaminophen (TYLENOL) 325 MG tablet Take 2 tablets (650 mg total) by mouth every 6 (six) hours as needed for mild pain (or Fever >/= 101). 02/01/18   Elgergawy, Leana Roe, MD  apixaban (ELIQUIS) 5 MG TABS tablet Take 1 tablet (5 mg total) by mouth in the morning and at bedtime. 11/07/22   Marcine Matar, MD  aspirin EC 81 MG tablet Take 1 tablet (81 mg total) by mouth daily. Swallow whole. 03/18/23   Barrett, Erin R, PA-C  atorvastatin (LIPITOR) 40 MG tablet Take 1 tablet (40 mg total) by mouth daily. 06/23/22   Marcine Matar, MD  Blood Glucose Monitoring Suppl (ACCU-CHEK GUIDE) w/Device KIT Use to check blood sugar 3 times daily. 01/13/23   Marcine Matar, MD  Blood Pressure Monitoring (BLOOD PRESSURE MONITOR/ARM) DEVI Check blood pressure once per day at least 2 hours after medications. 07/22/21   Alver Sorrow, NP  Continuous Blood Gluc Sensor (DEXCOM G7 SENSOR) MISC 1 Device by Does not apply route as directed. 08/22/22   Shamleffer,  Konrad Dolores, MD  diclofenac Sodium (VOLTAREN) 1 % GEL Apply 2 g topically 4 (four) times daily. Apply across lower back for back pain Patient taking differently: Apply 2 g topically 4 (four) times daily as needed (pain). Apply across lower back for back pain 11/18/22   Marcine Matar, MD  empagliflozin (JARDIANCE) 10 MG TABS tablet Take 1 tablet (10 mg total) by mouth daily. 04/23/23   Jonita Albee, PA-C  fenofibrate (TRICOR) 48 MG tablet Take 1 tablet (48 mg total) by mouth daily. 11/19/22   Marcine Matar, MD  ferrous sulfate 325 (65 FE) MG tablet Take 1 tablet (325 mg total) by mouth daily with breakfast. 04/11/21 04/12/24  Anders Simmonds, PA-C  glucose blood (TRUE METRIX BLOOD GLUCOSE TEST) test strip Use as instructed 05/06/23   Anders Simmonds, PA-C  insulin glargine (LANTUS SOLOSTAR) 100 UNIT/ML Solostar Pen Inject 55 Units into the skin daily. Patient taking differently: Inject 10 Units into the skin daily. 11/18/22   Marcine Matar, MD  insulin lispro (HUMALOG KWIKPEN) 100 UNIT/ML KwikPen Max daily 45 units Patient taking differently: Inject 5 Units into the skin with breakfast, with lunch, and with evening meal. 08/22/22   Shamleffer, Konrad Dolores, MD  Insulin Pen Needle 32G X 4 MM MISC use in the morning, at noon, in the evening, and at bedtime. 05/06/23   Anders Simmonds, PA-C  Insulin Syringe-Needle U-100 (TRUEPLUS INSULIN  SYRINGE) 31G X 5/16" 0.3 ML MISC Use to inject insulin daily. Patient taking differently: 1 each by Other route See admin instructions. Use to inject insulin daily. 05/06/18   Fulp, Cammie, MD  lidocaine (LIDODERM) 5 % Place 1 patch onto the skin daily. Remove & Discard patch within 12 hours or as directed by MD 05/13/23  Yes Dolphus Jenny, PA-C  liraglutide (VICTOZA) 18 MG/3ML SOPN Inject 1.8 mg into the skin daily. 08/22/22   Shamleffer, Konrad Dolores, MD  Multiple Vitamins-Minerals (CENTRUM SILVER PO) Take 1 tablet by mouth daily.     [provider]  Multiple Vitamins-Minerals (OCUVITE ADULT 50+) CAPS Take 1 capsule by mouth daily.    [provider]  pioglitazone (ACTOS) 15 MG tablet Take 1 tablet (15 mg total) by mouth daily. 08/22/22   Shamleffer, Konrad Dolores, MD  torsemide (DEMADEX) 20 MG tablet Take 1 tablet (20 mg) once a day, Take extra dose of 20 mg as needed for sudden weight gain and leg swelling 04/23/23   Jonita Albee, PA-C      Allergies    Trulicity [dulaglutide]    Review of Systems   Review of Systems  Cardiovascular:  Positive for chest pain.  Genitourinary:  Positive for flank pain.    Physical Exam Updated Vital Signs BP 112/63 (BP Location: Right Arm)   Pulse 69   Temp 98.2 F (36.8 C) (Oral)   Resp 15   Ht 5\' 3"  (1.6 m)   Wt 73.9 kg   SpO2 98%   BMI 28.87 kg/m  Physical Exam Vitals and nursing note reviewed.  Constitutional:      General: She is not in acute distress.    Appearance: She is well-developed.  HENT:     Head: Normocephalic and atraumatic.  Eyes:     Extraocular Movements: Extraocular movements intact.     Conjunctiva/sclera: Conjunctivae normal.  Cardiovascular:     Rate and Rhythm: Normal rate and regular rhythm.     Heart sounds: No murmur heard. Pulmonary:     Effort: Pulmonary effort is normal. No respiratory distress.     Breath sounds: Normal breath sounds. No decreased breath sounds.  Abdominal:     General: Bowel sounds are normal.     Palpations: Abdomen is soft.     Tenderness: There is no abdominal tenderness. There is right CVA tenderness.  Musculoskeletal:        General: No swelling.     Cervical back: Neck supple.     Right lower leg: No edema.     Left lower leg: No edema.  Skin:    General: Skin is warm and dry.     Capillary Refill: Capillary refill takes less than 2 seconds.  Neurological:     General: No focal deficit present.     Mental Status: She is alert.  Psychiatric:        Mood and Affect: Mood normal.      ED Results / Procedures / Treatments   Labs (all labs ordered are listed, but only abnormal results are displayed) Labs Reviewed  COMPREHENSIVE METABOLIC PANEL - Abnormal; Notable for the following components:      Result Value   CO2 19 (*)    Glucose, Bld 106 (*)    BUN 57 (*)    Creatinine, Ser 1.48 (*)    GFR, Estimated 39 (*)    All other components within normal limits  LIPASE, BLOOD - Abnormal; Notable for the following components:  Lipase 83 (*)    All other components within normal limits  CBC WITH DIFFERENTIAL/PLATELET - Abnormal; Notable for the following components:   Hemoglobin 11.6 (*)    Eosinophils Absolute 0.6 (*)    All other components within normal limits  TROPONIN I (HIGH SENSITIVITY) - Abnormal; Notable for the following components:   Troponin I (High Sensitivity) 18 (*)    All other components within normal limits  TROPONIN I (HIGH SENSITIVITY) - Abnormal; Notable for the following components:   Troponin I (High Sensitivity) 18 (*)    All other components within normal limits  URINALYSIS, ROUTINE W REFLEX MICROSCOPIC    EKG EKG Interpretation Date/Time:  Wednesday May 13 2023 20:43:14 EST Ventricular Rate:  72 PR Interval:  84 QRS Duration:  100 QT Interval:  420 QTC Calculation: 460 R Axis:   -33  Text Interpretation: Sinus rhythm Short PR interval Left axis deviation Low voltage, precordial leads Probable anteroseptal infarct, old Borderline repolarization abnormality Confirmed by Fulton Reek [phone]) on 05/13/2023 8:47:53 PM  Radiology CT Angio Chest/Abd/Pel for Dissection W and/or W/WO  Result Date: 05/13/2023 CLINICAL DATA:  Chest pain, acute aortic syndrome suspected. Pain is on the left side and radiates to the flank/back. Bypass 1 month ago. EXAM: CT ANGIOGRAPHY CHEST, ABDOMEN AND PELVIS TECHNIQUE: Non-contrast CT of the chest was initially obtained. Multidetector CT imaging through the chest, abdomen and pelvis was performed  using the standard protocol during bolus administration of intravenous contrast. Multiplanar reconstructed images and MIPs were obtained and reviewed to evaluate the vascular anatomy. RADIATION DOSE REDUCTION: This exam was performed according to the departmental dose-optimization program which includes automated exposure control, adjustment of the mA and/or kV according to patient size and/or use of iterative reconstruction technique. CONTRAST:  OMNIPAQUE IOHEXOL 350 MG/ML SOLN COMPARISON:  Chest radiograph 05/13/2023; CTA chest 04/13/2023; CT abdomen and pelvis 02/26/2021 FINDINGS: CTA CHEST FINDINGS Cardiovascular: Normal caliber thoracic aorta without dissection, intramural hematoma, or penetrating atherosclerotic ulcer. Sternotomy and CABG. Coronary artery and aortic atherosclerotic calcification. Cardiomegaly. Reflux of contrast into the IVC and hepatic veins compatible with elevated right heart pressures. Filling defects in the left atrial appendage are presumed due to mixing artifact. No pericardial effusion. No pulmonary embolism. Left chest wall pacemaker. Mediastinum/Nodes: Trachea and esophagus are unremarkable. No thoracic adenopathy. Trace residual fluid/stranding in the anterior mediastinum related to bypass 1 month ago. Lungs/Pleura: Scattered atelectasis/scarring. No pleural effusion or pneumothorax. Musculoskeletal: No acute fracture. Review of the MIP images confirms the above findings. CTA ABDOMEN AND PELVIS FINDINGS VASCULAR Scattered calcified atherosclerotic plaque the aorta and its mesenteric, renal, and iliac artery branches without hemodynamically significant stenosis. No aortic aneurysm or dissection. Review of the MIP images confirms the above findings. NON-VASCULAR Hepatobiliary: Cholecystectomy. Unremarkable liver and biliary tree. Pancreas: Unremarkable. Spleen: Unremarkable. Adrenals/Urinary Tract: Normal adrenal glands. No urinary calculi or hydronephrosis. Unremarkable bladder  Stomach/Bowel: Normal caliber large and small bowel. Colonic diverticulosis without diverticulitis. The transverse colon herniates into a ventral abdominal wall hernia. No evidence of obstruction or strangulation. Appendix and stomach are within normal limits. Lymphatic: No lymphadenopathy. Reproductive: Uterus and bilateral adnexa are unremarkable. Other: No free intraperitoneal fluid or air. Unchanged rim calcified structure near the dome of the bladder. Musculoskeletal: No acute fracture. Review of the MIP images confirms the above findings. IMPRESSION: 1. No acute aortic syndrome. 2. No acute abnormality in the chest, abdomen, or pelvis. 3. Cardiomegaly with elevated right heart pressures. 4. 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 No evidence of obstruction or strangulation. 5. Filling defects in the left atrial appendage are presumed due to mixing artifact. Consider echocardiography if there is concern for atrial thrombus. Aortic Atherosclerosis (ICD10-I70.0). Electronically Signed   By: Minerva Fester M.D.   On: 05/13/2023 23:17   DG Chest 2 View  Result Date: 05/13/2023 CLINICAL DATA:  Chest pain for 3 days. EXAM: CHEST - 2 VIEW COMPARISON:  April 13, 2023 FINDINGS: There is stable single lead ventricular pacer positioning. Multiple sternal wires are seen. The heart size and mediastinal contours are within normal limits. Radiopaque surgical clips are seen overlying the right lung base. Low lung volumes are seen with mild, stable scarring and/or atelectatic changes noted within the bilateral lung bases, left greater than right. No pleural effusion or pneumothorax is identified. The visualized skeletal structures are unremarkable. IMPRESSION: 1. Evidence of prior median sternotomy. 2. Mild, stable scarring and/or atelectasis within the bilateral lung bases. Electronically Signed   By: Aram Candela M.D.   On: 05/13/2023 21:52    Procedures Procedures     Medications Ordered in ED Medications  sodium chloride 0.9 % bolus 1,000 mL (0 mLs Intravenous Stopped 05/13/23 2335)  iohexol (OMNIPAQUE) 350 MG/ML injection 100 mL (100 mLs Intravenous Contrast Given 05/13/23 2257)    ED Course/ Medical Decision Making/ A&P                                 Medical Decision Making  This patient presents to the ED with chief complaint(s) of chest pain x 3 days with pertinent past medical history of CABG, pancreatitis, pacemaker which further complicates the presenting complaint. The complaint involves an extensive differential diagnosis and also carries with it a high risk of complications and morbidity.    The differential diagnosis includes pancreatitis, renal stone, pyelonephritis,  Additional history obtained: Records reviewed cardiology progress notes  ED Course and Reassessment:   Independent labs interpretation:  The following labs were independently interpreted:  EKG: Sinus rhythm, Short PR interval, Left axis deviation, Probable anteroseptal infarct, old CBC: Mild anemia at 11.6 CMP: Mildly decreased CO2, elevated bun at 57, elevated creatinine which is chronic per historical values Lipase: 83 Troponin: 18, 18  Independent visualization of imaging: - I independently visualized the following imaging with scope of interpretation limited to determining acute life threatening conditions related to emergency care: Chest x-ray, which revealed mild, stable scarring and/or atelectasis within bilateral lung bases. CT angio chest/abdomen/pelvis for dissection: No acute aortic syndrome, no acute abnormality in chest, abdomen or pelvis.  Ventral abdominal wall hernia containing a loop of transverse colon no evidence of obstruction or strangulation.  Filling defects in the left atrial appendage due to mixing artifact.  Consultation: - Consulted or discussed management/test interpretation w/ external professional: None  Consideration for  admission or further workup: Considered for for admission or further workup however patient's labs, physical exam, and imaging of all been reassuring.  Patient will be treated outpatient for chest/side pain.  Patient should follow-up with PCP if symptoms persist for further evaluation and treatment.         Final Clinical Impression(s) / ED Diagnoses Final diagnoses:  Chest pain, unspecified type    Rx / DC Orders ED Discharge Orders          Ordered    lidocaine (LIDODERM) 5 %  Every 24 hours        05/13/23 2345  Dolphus Jenny, PA-C 05/13/23 2350    Laurence Spates, MD 05/15/23 2252

## 2023-05-13 NOTE — Discharge Instructions (Addendum)
Today you were seen for left-sided chest pain.  This pick up your prescription and take as directed. Thank you for letting us treat you today. After reviewing your labs and imaging, I feel you are safe to go home. Please follow up with your PCP in the next several days and provide them with your records from this visit. Return to the Emergency Room if pain becomes severe or symptoms worsen.

## 2023-05-15 ENCOUNTER — Other Ambulatory Visit: Payer: Self-pay

## 2023-05-15 ENCOUNTER — Other Ambulatory Visit (HOSPITAL_COMMUNITY): Payer: Self-pay

## 2023-05-18 ENCOUNTER — Telehealth: Payer: Self-pay

## 2023-05-18 ENCOUNTER — Other Ambulatory Visit: Payer: Self-pay

## 2023-05-18 ENCOUNTER — Encounter: Payer: Self-pay | Admitting: Internal Medicine

## 2023-05-18 ENCOUNTER — Ambulatory Visit: Payer: Medicare HMO | Attending: Internal Medicine | Admitting: Internal Medicine

## 2023-05-18 VITALS — BP 135/83 | HR 88 | Temp 97.8°F | Ht 63.0 in | Wt 167.1 lb

## 2023-05-18 DIAGNOSIS — I5032 Chronic diastolic (congestive) heart failure: Secondary | ICD-10-CM | POA: Diagnosis not present

## 2023-05-18 DIAGNOSIS — Z23 Encounter for immunization: Secondary | ICD-10-CM | POA: Diagnosis not present

## 2023-05-18 DIAGNOSIS — K429 Umbilical hernia without obstruction or gangrene: Secondary | ICD-10-CM | POA: Diagnosis not present

## 2023-05-18 DIAGNOSIS — N1832 Chronic kidney disease, stage 3b: Secondary | ICD-10-CM | POA: Diagnosis not present

## 2023-05-18 DIAGNOSIS — I2510 Atherosclerotic heart disease of native coronary artery without angina pectoris: Secondary | ICD-10-CM | POA: Diagnosis not present

## 2023-05-18 DIAGNOSIS — I10 Essential (primary) hypertension: Secondary | ICD-10-CM | POA: Diagnosis not present

## 2023-05-18 MED ORDER — FENOFIBRATE 48 MG PO TABS
48.0000 mg | ORAL_TABLET | Freq: Every day | ORAL | 1 refills | Status: DC
  Filled 2023-05-18: qty 90, 90d supply, fill #0
  Filled 2023-05-18: qty 60, 60d supply, fill #0
  Filled 2023-07-21: qty 90, 90d supply, fill #0
  Filled 2023-10-16: qty 30, 30d supply, fill #1

## 2023-05-18 NOTE — Telephone Encounter (Signed)
-----   Message from Jonita Albee sent at 05/18/2023  6:53 AM EST ----- Please tell patient that her creatinine and BUN are improving since she stopped taking torsemide every day. She should continue to take torsemide 20 mg as needed, no changes to medications at this time  Thanks KJ

## 2023-05-18 NOTE — Telephone Encounter (Signed)
Pt returning call

## 2023-05-18 NOTE — Telephone Encounter (Signed)
Called patient with no answer. Detailed voicemail left on patient's voicemail per her request.

## 2023-05-18 NOTE — Patient Instructions (Signed)
Restart Torsemide and take 1 tablet every Monday and Thursday.

## 2023-05-18 NOTE — Telephone Encounter (Signed)
Left message to call message

## 2023-05-18 NOTE — Telephone Encounter (Signed)
Patient returning call, states you can leave a VM

## 2023-05-18 NOTE — Progress Notes (Signed)
Patient ID: Emily Farmer, female    DOB: 02-24-1956  MRN: [id number]  CC: Follow-up (ER f/u. Med refill. /Swelling on R side of lower back x3 days/Yes to flu vax)   Subjective: Emily Farmer is a 67 y.o. female who presents for ER f/u. 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  Had CTA chest that showed : IMPRESSION: 1. No acute aortic syndrome. 2. No acute abnormality in the chest, abdomen, or pelvis. 3. Cardiomegaly with elevated right heart pressures. 4. Ventral abdominal wall hernia containing a loop of transverse colon. No evidence of obstruction or strangulation. 5. Filling defects in the left atrial appendage are presumed due to mixing artifact. Consider echocardiography if there is concern for atrial thrombus. Aortic Atherosclerosis (ICD10-I70.0).  Lidocaine patch ordered and helps. Pain eased off yesterday but occasionally she would get a sharp pain in upper back.   Has f/u with Cardiology 06/04/23 Compliant ASA 81 mg, Eliquis 5 mg BID, Lipitor 40 mg, Jardiance 10 mg, Tricor 48 mg. Torsemide on hold for past wk by cardiology.  She has had some swelling in RT ankle.  Some swelling of RT side of her back BP elev today.  Tries to limit salt.   Wgh today at home 167 lbs.  On 05/15/23 wgh was 172 lbs.  No SOB.  No palpitations  Still doing home P.T 2x/wk  Patient Active Problem List   Diagnosis Date Noted   Permanent atrial fibrillation (HCC) 03/17/2023   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/2023    Coronary artery disease 03/09/2023   Coronary artery disease involving native coronary artery of native heart with unstable angina pectoris (HCC) 03/05/2023   Accelerating angina (HCC) 03/01/2023   Stage 3b chronic kidney disease (HCC) 11/18/2022   Proliferative diabetic retinopathy of both eyes (HCC) 10/29/2022   Peripheral arterial disease (HCC) 10/14/2022   Polyneuropathy associated with underlying disease (HCC) 08/22/2022   Type 2 diabetes mellitus with peripheral neuropathy (HCC) 08/22/2022   Pacemaker 06/03/2022   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   Bacteremia 04/01/2021   Atrial fibrillation with slow ventricular response (HCC) 03/31/2021   Pulmonary hypertension (HCC) 03/19/2021   Hypomagnesemia    Sepsis (HCC) 02/27/2021   Atrial fibrillation with rapid ventricular response (HCC) 02/27/2021   Hypotension 02/27/2021   Secondary hypercoagulable state (HCC) 05/08/2020   Dyslipidemia 09/23/2019   Toenail fungus 07/19/2018   Depression 07/19/2018   Iron deficiency anemia due to chronic blood loss 07/19/2018   Gastritis and duodenitis 07/19/2018   Chronic a-fib (HCC) 01/28/2018   Hyponatremia 01/28/2018   Chronic diastolic CHF (congestive heart failure) (HCC) 01/28/2018   Mixed hyperlipidemia 06/01/2017   Paroxysmal A-fib (HCC) 10/04/2016   Hypokalemia 01/04/2014   Acute pyelonephritis 02/15/2013   Essential hypertension 02/15/2013   Hypertriglyceridemia 02/15/2013   Coronary atherosclerosis seen on CT 02/15/2013     Current Outpatient Medications on File Prior to Visit  Medication Sig Dispense Refill   Accu-Chek Softclix Lancets lancets Use to check blood sugar 3 times daily. 100 each 3  acetaminophen (TYLENOL) 325 MG tablet Take 2 tablets (650 mg total) by mouth every 6 (six) hours as needed for mild pain (or Fever >/= 101).     apixaban (ELIQUIS) 5 MG TABS tablet Take 1 tablet (5 mg total) by mouth in the morning and at bedtime. 180 tablet 1   aspirin EC  81 MG tablet Take 1 tablet (81 mg total) by mouth daily. Swallow whole.     atorvastatin (LIPITOR) 40 MG tablet Take 1 tablet (40 mg total) by mouth daily. 90 tablet 3   Blood Glucose Monitoring Suppl (ACCU-CHEK GUIDE) w/Device KIT Use to check blood sugar 3 times daily. 1 kit 0   Blood Pressure Monitoring (BLOOD PRESSURE MONITOR/ARM) DEVI Check blood pressure once per day at least 2 hours after medications. 1 each 0   Continuous Blood Gluc Sensor (DEXCOM G7 SENSOR) MISC 1 Device by Does not apply route as directed. 9 each 3   diclofenac Sodium (VOLTAREN) 1 % GEL Apply 2 g topically 4 (four) times daily. Apply across lower back for back pain (Patient taking differently: Apply 2 g topically 4 (four) times daily as needed (pain). Apply across lower back for back pain) 100 g 0   empagliflozin (JARDIANCE) 10 MG TABS tablet Take 1 tablet (10 mg total) by mouth daily. 90 tablet 3   ferrous sulfate 325 (65 FE) MG tablet Take 1 tablet (325 mg total) by mouth daily with breakfast. 30 tablet 4   glucose blood (TRUE METRIX BLOOD GLUCOSE TEST) test strip Use as instructed 100 each 12   insulin glargine (LANTUS SOLOSTAR) 100 UNIT/ML Solostar Pen Inject 55 Units into the skin daily. (Patient taking differently: Inject 10 Units into the skin daily.) 45 mL 3   insulin lispro (HUMALOG KWIKPEN) 100 UNIT/ML KwikPen Max daily 45 units (Patient taking differently: Inject 5 Units into the skin with breakfast, with lunch, and with evening meal.) 45 mL 2   Insulin Pen Needle 32G X 4 MM MISC use in the morning, at noon, in the evening, and at bedtime. 400 each 3   Insulin Syringe-Needle U-100 (TRUEPLUS INSULIN SYRINGE) 31G X 5/16" 0.3 ML MISC Use to inject insulin daily. (Patient taking differently: 1 each by Other route See admin instructions. Use to inject insulin daily.) 100 each 11   lidocaine (LIDODERM) 5 % Place 1 patch onto the skin daily. Remove & Discard patch within 12 hours or as directed by MD 30 patch 0    liraglutide (VICTOZA) 18 MG/3ML SOPN Inject 1.8 mg into the skin daily. 27 mL 3   Multiple Vitamins-Minerals (CENTRUM SILVER PO) Take 1 tablet by mouth daily.     Multiple Vitamins-Minerals (OCUVITE ADULT 50+) CAPS Take 1 capsule by mouth daily.     pioglitazone (ACTOS) 15 MG tablet Take 1 tablet (15 mg total) by mouth daily. 90 tablet 3   torsemide (DEMADEX) 20 MG tablet Take 1 tablet (20 mg) once a day, Take extra dose of 20 mg as needed for sudden weight gain and leg swelling 90 tablet 3   No current facility-administered medications on file prior to visit.    Allergies  Allergen Reactions   Trulicity [Dulaglutide] Diarrhea    Social History   Socioeconomic History   Marital status: Single    Spouse name: Not on file   Number of children: Not on file   Years of education: Not on file   Highest education level: Not on file  Occupational History   Not on file  Tobacco Use   Smoking status: Never   Smokeless tobacco: Never  Vaping Use   Vaping status: Never Used  Substance and Sexual Activity   Alcohol use: No   Drug use: No   Sexual activity: Not Currently  Other Topics Concern   Not on file  Social History Narrative   Lives with her sister and does not need any assistance with ADLs.     Social Determinants of Health   Financial Resource Strain: Medium Risk (05/18/2023)   Overall Financial Resource Strain (CARDIA)    Difficulty of Paying Living Expenses: Somewhat hard  Food Insecurity: Food Insecurity Present (05/18/2023)   Hunger Vital Sign    Worried About Running Out of Food in the Last Year: Sometimes true    Ran Out of Food in the Last Year: Sometimes true  Transportation Needs: No Transportation Needs (05/18/2023)   PRAPARE - Administrator, Civil Service (Medical): No    Lack of Transportation (Non-Medical): No  Recent Concern: Transportation Needs - Unmet Transportation Needs (05/06/2023)   PRAPARE - Transportation    Lack of Transportation  (Medical): Yes    Lack of Transportation (Non-Medical): Yes  Physical Activity: Insufficiently Active (05/06/2023)   Exercise Vital Sign    Days of Exercise per Week: 7 days    Minutes of Exercise per Session: 20 min  Stress: No Stress Concern Present (05/18/2023)   [employer] of Occupational Health - Occupational Stress Questionnaire    Feeling of Stress : Not at all  Recent Concern: Stress - Stress Concern Present (05/06/2023)   [employer] of Occupational Health - Occupational Stress Questionnaire    Feeling of Stress : Very much  Social Connections: Socially Isolated (05/18/2023)   Social Connection and Isolation Panel [NHANES]    Frequency of Communication with Friends and Family: Once a week    Frequency of Social Gatherings with Friends and Family: Once a week    Attends Religious Services: Never    Database administrator or Organizations: No    Attends Banker Meetings: Never    Marital Status: Never married  Intimate Partner Violence: Not At Risk (05/18/2023)   Humiliation, Afraid, Rape, and Kick questionnaire    Fear of Current or Ex-Partner: No    Emotionally Abused: No    Physically Abused: No    Sexually Abused: No    Family History  Problem Relation Age of Onset   Heart attack Mother    Heart attack Father    Diabetes Sister    High blood pressure Neg Hx    High Cholesterol Neg Hx     Past Surgical History:  Procedure Laterality Date   AV NODE ABLATION N/A 01/02/2023   Procedure: AV NODE ABLATION;  Surgeon: Marinus Maw, MD;  Location: [facility] INVASIVE CV LAB;  Service: Cardiovascular;  Laterality: N/A;   BIOPSY  06/20/2018   Procedure: BIOPSY;  Surgeon: Vida Rigger, MD;  Location[facility] ENDOSCOPY;  Service: Endoscopy;;   CHOLECYSTECTOMY N/A 01/04/2014   Procedure: LAPAROSCOPIC CHOLECYSTECTOMY WITH INTRAOPERATIVE CHOLANGIOGRAM;  Surgeon: Axel Filler, MD;  Location: [facility] OR;  Service: General;  Laterality: N/A;   COLONOSCOPY WITH PROPOFOL N/A  06/21/2018   Procedure: COLONOSCOPY WITH PROPOFOL;  Surgeon: Bernette Redbird, MD;  Location: [facility] ENDOSCOPY;  Service: Endoscopy;  Laterality: N/A;   CORONARY ARTERY BYPASS GRAFT N/A 03/09/2023   Procedure: CORONARY ARTERY BYPASS GRAFTING (CABG) TIMES THREE USING THE LEFT INTERNAL MAMMARY ARTERY (LIMA) AND ENDOSCOPICALLY HARVESTED RIGHT GREATER SAPHENOUS  VEIN;  Surgeon: [name], MD;  Location: [facility] OR;  Service: Open Heart Surgery;  Laterality: N/A;   ESOPHAGOGASTRODUODENOSCOPY (EGD) WITH PROPOFOL N/A 06/20/2018   Procedure: ESOPHAGOGASTRODUODENOSCOPY (EGD) WITH PROPOFOL;  Surgeon: Vida Rigger, MD;  Location: [facility] ENDOSCOPY;  Service: Endoscopy;  Laterality: N/A;   IR THORACENTESIS ASP PLEURAL SPACE W/IMG GUIDE  03/16/2023   LUNG SURGERY     PACEMAKER IMPLANT N/A 03/03/2022   Procedure: PACEMAKER IMPLANT;  Surgeon: Marinus Maw, MD;  Location: [facility] INVASIVE CV LAB;  Service: Cardiovascular;  Laterality: N/A;   RIGHT HEART CATH N/A 03/06/2023   Procedure: RIGHT HEART CATH;  Surgeon: Laurey Morale, MD;  Location: [facility] INVASIVE CV LAB;  Service: Cardiovascular;  Laterality: N/A;   RIGHT/LEFT HEART CATH AND CORONARY ANGIOGRAPHY N/A 03/03/2023   Procedure: RIGHT/LEFT HEART CATH AND CORONARY ANGIOGRAPHY;  Surgeon: Marykay Lex, MD;  Location: [facility] INVASIVE CV LAB;  Service: Cardiovascular;  Laterality: N/A;   TEE WITHOUT CARDIOVERSION N/A 03/09/2023   Procedure: TRANSESOPHAGEAL ECHOCARDIOGRAM;  Surgeon: [name], MD;  Location: [facility] OR;  Service: Open Heart Surgery;  Laterality: N/A;    ROS: Review of Systems Negative except as stated above  PHYSICAL EXAM: BP 135/83   Pulse 88   Temp 97.8 F (36.6 C) (Oral)   Ht 5\' 3"  (1.6 m)   Wt 167 lb 1.6 oz (75.8 kg)   SpO2 97%   BMI 29.60 kg/m   Physical Exam  General appearance - alert, well appearing, and in no distress Mental status - normal mood, behavior, speech, dress, motor activity, and thought processes Chest - sounds decreased mildly at  bases Heart - pt in sinus rhythm.  Incision from CABG done back in September is healing well. Abdomen: Normal bowel sounds, nondistended, soft, nontender, periumbilical hernia below the umbilicus noted when asked to cough.  It is compressible. Extremities - 1+ BL ankle edema MSK: No swelling noted of her lower back.      Latest Ref Rng & Units 05/13/2023    9:10 PM 05/13/2023   10:07 AM 05/05/2023    9:58 AM  CMP  Glucose 70 - 99 mg/dL 440  87  347   BUN 8 - 23 mg/dL 57  54  82   Creatinine 0.44 - 1.00 mg/dL 4.25  9.56  3.87   Sodium 135 - 145 mmol/L 138  139  139   Potassium 3.5 - 5.1 mmol/L 3.9  4.0  3.9   Chloride 98 - 111 mmol/L 106  100  92   CO2 22 - 32 mmol/L 19  21  30    Calcium 8.9 - 10.3 mg/dL 9.2  9.3  56.4   Total Protein 6.5 - 8.1 g/dL 7.0     Total Bilirubin <1.2 mg/dL 0.4     Alkaline Phos 38 - 126 U/L 44     AST 15 - 41 U/L 23     ALT 0 - 44 U/L 14      Lipid Panel     Component Value Date/Time   CHOL 95 03/02/2023 0422   CHOL 351 (H) 11/18/2022 1216   TRIG 121 03/02/2023 0422   HDL 39 (L) 03/02/2023 0422   HDL 31 (L) 11/18/2022 1216   CHOLHDL 2.4 03/02/2023 0422   VLDL 24 03/02/2023 0422   LDLCALC 32 03/02/2023 0422   LDLCALC Comment (A) 11/18/2022 1216   LDLDIRECT 137 (H) 03/01/2020 1012   LDLDIRECT 96.0 09/23/2019 0828    CBC  Component Value Date/Time   WBC 9.4 05/13/2023 2110   RBC 4.40 05/13/2023 2110   HGB 11.6 (L) 05/13/2023 2110   HGB 12.8 12/08/2022 1446   HCT 37.3 05/13/2023 2110   HCT 39.4 12/08/2022 1446   PLT 196 05/13/2023 2110   PLT 249 12/08/2022 1446   MCV 84.8 05/13/2023 2110   MCV 91 12/08/2022 1446   MCH 26.4 05/13/2023 2110   MCHC 31.1 05/13/2023 2110   RDW 15.1 05/13/2023 2110   RDW 13.2 12/08/2022 1446   LYMPHSABS 1.6 05/13/2023 2110   LYMPHSABS 1.9 04/11/2021 1447   MONOABS 0.8 05/13/2023 2110   EOSABS 0.6 (H) 05/13/2023 2110   EOSABS 0.4 04/11/2021 1447   BASOSABS 0.0 05/13/2023 2110   BASOSABS 0.0  04/11/2021 1447    ASSESSMENT AND PLAN: 1. Coronary artery disease involving native coronary artery of native heart without angina pectoris Continue  ASA 81 mg, Lipitor 40 mg, Jardiance 10 mg, Tricor 48 mg. - fenofibrate (TRICOR) 48 MG tablet; Take 1 tablet (48 mg total) by mouth daily.  Dispense: 90 tablet; Refill: 1  2. Chronic diastolic CHF (congestive heart failure) (HCC) Patient with some ankle edema but no PND or orthopnea.  I recommend restarting the torsemide and taking the 40 mg 1 tablet twice a week.   Continue to monitor kidney function especially with her being on Jardiance.  3. Essential hypertension Repeat blood pressure better.  Restart torsemide taking it twice a week as mentioned above.  4. Stage 3b chronic kidney disease (HCC) Needs to be monitored closely.  Most recent GFR was 39.  5. Periumbilical hernia Discussed this incidental finding that was seen on CTA that was done on recent ER visit.  Patient without symptoms at this time.  Went over signs and symptoms that would suggest strangulation and advised that she be seen in the emergency room.  In the meantime she should avoid any heavy lifting, pushing or pulling.  Once she is old healed from her heart surgery, we can pursue referral to a general surgeon if she would like to have this electively repaired  6. Need for influenza vaccination Given today.  Patient was given the opportunity to ask questions.  Patient verbalized understanding of the plan and was able to repeat key elements of the plan.   This documentation was completed using Paediatric nurse.  Any transcriptional errors are unintentional.  Orders Placed This Encounter  Procedures   Flu vaccine trivalent PF, 6mos and older(Flulaval,Afluria,Fluarix,Fluzone)     Requested Prescriptions   Signed Prescriptions Disp Refills   fenofibrate (TRICOR) 48 MG tablet 90 tablet 1    Sig: Take 1 tablet (48 mg total) by mouth daily.    F/U  08/06/2023  Jonah Blue, MD, FACP

## 2023-05-18 NOTE — Telephone Encounter (Signed)
 Called patient with no answer.  Left voicemail to return call.

## 2023-05-19 ENCOUNTER — Telehealth: Payer: Self-pay | Admitting: Internal Medicine

## 2023-05-19 NOTE — Telephone Encounter (Signed)
Patient was returning call. Please advise ?

## 2023-05-19 NOTE — Telephone Encounter (Signed)
 Called patient with no answer.  Left voicemail to return call.

## 2023-05-19 NOTE — Telephone Encounter (Signed)
Left message to call back  

## 2023-05-19 NOTE — Telephone Encounter (Signed)
Patient returned staff call regarding results.

## 2023-05-20 ENCOUNTER — Ambulatory Visit: Payer: Self-pay | Admitting: Licensed Clinical Social Worker

## 2023-05-20 ENCOUNTER — Telehealth: Payer: Self-pay

## 2023-05-20 DIAGNOSIS — E1122 Type 2 diabetes mellitus with diabetic chronic kidney disease: Secondary | ICD-10-CM | POA: Diagnosis not present

## 2023-05-20 DIAGNOSIS — Z48812 Encounter for surgical aftercare following surgery on the circulatory system: Secondary | ICD-10-CM | POA: Diagnosis not present

## 2023-05-20 DIAGNOSIS — I5032 Chronic diastolic (congestive) heart failure: Secondary | ICD-10-CM | POA: Diagnosis not present

## 2023-05-20 DIAGNOSIS — I130 Hypertensive heart and chronic kidney disease with heart failure and stage 1 through stage 4 chronic kidney disease, or unspecified chronic kidney disease: Secondary | ICD-10-CM | POA: Diagnosis not present

## 2023-05-20 DIAGNOSIS — I480 Paroxysmal atrial fibrillation: Secondary | ICD-10-CM | POA: Diagnosis not present

## 2023-05-20 DIAGNOSIS — I495 Sick sinus syndrome: Secondary | ICD-10-CM | POA: Diagnosis not present

## 2023-05-20 DIAGNOSIS — N1832 Chronic kidney disease, stage 3b: Secondary | ICD-10-CM | POA: Diagnosis not present

## 2023-05-20 DIAGNOSIS — I2510 Atherosclerotic heart disease of native coronary artery without angina pectoris: Secondary | ICD-10-CM | POA: Diagnosis not present

## 2023-05-20 DIAGNOSIS — I051 Rheumatic mitral insufficiency: Secondary | ICD-10-CM | POA: Diagnosis not present

## 2023-05-20 NOTE — Patient Instructions (Signed)
Visit Information  Thank you for taking time to visit with me today. Please don't hesitate to contact me if I can be of assistance to you.   Following are the goals we discussed today:   Goals Addressed             This Visit's Progress    Care Coordination Activities       Care Coordination Interventions: Patient stated that she is low on food and that her rent is getting higher and may continue to go up in January, currently she lives with her cousin and they pay $1500, the patient only brings home $961 per month along with what her cousin brings in.  Patient stated that she is going to go apply for SNAP benefits at [facility] and that they use public transportation. The SW will mail food pantry resources and low income housing resources to the patient, the SW also educated the patient on the [employer] App for the patient to download.  SW will follow up in 2 weeks on 06/05/2023 at 1:15 pm        Our next appointment is by telephone on 06/05/2023 at 1:15 pm  Please call the care guide team at [phone] if you need to cancel or reschedule your appointment.   If you are experiencing a Mental Health or Behavioral Health Crisis or need someone to talk to, please call the Suicide and Crisis Lifeline: 988 go to [facility] [address], [place] [phone]) call 911  The patient verbalized understanding of instructions, educational materials, and care plan provided today and DECLINED offer to receive copy of patient instructions, educational materials, and care plan.   Jeanie Cooks, PhD [facility], [facility] Social Worker Direct Dial: [phone]  Fax: [phone]

## 2023-05-20 NOTE — Telephone Encounter (Signed)
Copied from CRM [id number]. Topic: General - Other >> May 20, 2023  7:35 AM Tiffany B wrote: Patient wanted to inform PCP of weight, please see below  05/19/2023 155.4 lbs  05/19/2020 156.2 lbs

## 2023-05-20 NOTE — Patient Outreach (Signed)
  Care Coordination   Initial Visit Note   05/20/2023 Name: Emily Farmer MRN: [id number] DOB: 1955/10/17  Emily Farmer is a 67 y.o. year old female who sees Marcine Matar, MD for primary care. I spoke with  Emily Farmer by phone today.  What matters to the patients health and wellness today?  Food Insecurities and housing     Goals Addressed             This Visit's Progress    Care Coordination Activities       Care Coordination Interventions: Patient stated that she is low on food and that her rent is getting higher and may continue to go up in January, currently she lives with her cousin and they pay $1500, the patient only brings home $961 per month along with what her cousin brings in.  Patient stated that she is going to go apply for SNAP benefits at [employer] and that they use public transportation. The SW will mail food pantry resources and low income housing resources to the patient, the SW also educated the patient on the [employer] App for the patient to download.  SW will follow up in 2 weeks on 06/05/2023 at 1:15 pm        SDOH assessments and interventions completed:  Yes     Care Coordination Interventions:  Yes, provided  Interventions Today    Flowsheet Row Most Recent Value  General Interventions   General Interventions Discussed/Reviewed General Interventions Discussed, [name] is going to apply for [employer] benefits at [employer], SW will also [employer] resources and low income housing resources.]        Follow up plan: Follow up call scheduled for 06/05/2023 1:15 pm    Encounter Outcome:  Patient Visit Completed   Jeanie Cooks, PhD [facility], [facility] Social Worker Direct Dial: [phone]  Fax: [phone]

## 2023-05-20 NOTE — Telephone Encounter (Signed)
 Called patient with no answer.  Left voicemail to return call.

## 2023-05-21 DIAGNOSIS — I2510 Atherosclerotic heart disease of native coronary artery without angina pectoris: Secondary | ICD-10-CM | POA: Diagnosis not present

## 2023-05-21 DIAGNOSIS — Z48812 Encounter for surgical aftercare following surgery on the circulatory system: Secondary | ICD-10-CM | POA: Diagnosis not present

## 2023-05-21 DIAGNOSIS — E1122 Type 2 diabetes mellitus with diabetic chronic kidney disease: Secondary | ICD-10-CM | POA: Diagnosis not present

## 2023-05-21 DIAGNOSIS — I5032 Chronic diastolic (congestive) heart failure: Secondary | ICD-10-CM | POA: Diagnosis not present

## 2023-05-21 DIAGNOSIS — I480 Paroxysmal atrial fibrillation: Secondary | ICD-10-CM | POA: Diagnosis not present

## 2023-05-21 DIAGNOSIS — I495 Sick sinus syndrome: Secondary | ICD-10-CM | POA: Diagnosis not present

## 2023-05-21 DIAGNOSIS — I130 Hypertensive heart and chronic kidney disease with heart failure and stage 1 through stage 4 chronic kidney disease, or unspecified chronic kidney disease: Secondary | ICD-10-CM | POA: Diagnosis not present

## 2023-05-21 DIAGNOSIS — N1832 Chronic kidney disease, stage 3b: Secondary | ICD-10-CM | POA: Diagnosis not present

## 2023-05-21 DIAGNOSIS — I051 Rheumatic mitral insufficiency: Secondary | ICD-10-CM | POA: Diagnosis not present

## 2023-05-21 NOTE — Telephone Encounter (Signed)
Patient returned RN's call regarding results. 

## 2023-05-21 NOTE — Telephone Encounter (Signed)
Called patient with with no answer. Left message to return call to office. 3 attempts made to contact patient.

## 2023-05-21 NOTE — Telephone Encounter (Signed)
 Called patient with no answer. Left message to return call.

## 2023-05-25 ENCOUNTER — Telehealth: Payer: Self-pay | Admitting: Internal Medicine

## 2023-05-25 NOTE — Telephone Encounter (Signed)
Pt is calling to report tot Dr. Laural Farmer her weight is 156lbs. Please advise CB- [phone]

## 2023-05-25 NOTE — Progress Notes (Signed)
Cardiology Office Note:    Date:  06/04/2023   ID:  Emily Farmer, DOB Nov 15, 1955, MRN [id number]  PCP:  Marcine Matar, MD    [facility] Providers Cardiologist:  Chrystie Nose, MD Electrophysiologist:  Lanier Prude, MD     Referring MD: Marcine Matar, MD   Chief Complaint  Patient presents with   Follow-up    CHF, AKI    History of Present Illness:    Emily Farmer is a 67 y.o. female with a hx of CAD s/p CABG x 3 with SVG-LAD, SVG-OM, SVG-PDA, on 03/09/2023, permanent A-fib s/p AV node ablation and single-chamber PPM 12/2022, DM2 on insulin, hyperlipidemia.  Approximately 2 months after ablation and PPM placement, she presented with chest heaviness and shortness of breath on exertion.  Echocardiogram 03/02/2023 showed an EF 60 to 65%, no WMA, mildly reduced RV, severely elevated PASP, mild MR.  She underwent right left heart catheterization 03/03/2023 that showed multivessel CAD and severe pulmonary hypertension.  She was referred to advanced heart failure clinic who titrated diuresis SGLT2 inhibitor and recommended CABG.  She underwent CABG x3 on 03/09/23. LIMA was small with slow blood flow and felt not suitable as a bypass conduit.  Postop course has been complicated by preoperative deconditioning.  She required midodrine wean to 5 mg twice daily prior to discharge.  She restarted her home 100 mg Toprol and 120 mg Cardizem, midodrine was discontinued at CT surgery follow-up.  She has had subsequent ER visits for chest discomfort, last ER visit was the same day she saw CT surgery in the office -05/13/2023.  She presents today for cardiology follow-up.  She is overall doing well, she has baseline dyspnea on exertion that she has had for greater than 1 year.  She remains active climbing flights of stairs.  Apparently her pacemaker transmitter is not working, I will message the device clinic.  She is also very confused about what diuretic regimen she should be on.  I  suspect her AKI may be related to taking furosemide and torsemide simultaneously.  I have stopped all diuretic at this time and have asked her to keep a blood pressure, heart rate, and weight log.  I will keep close follow-up on her.  For now she will take 20 mg torsemide as needed, no further furosemide.    Past Medical History:  Diagnosis Date   Arthritis    Atrial fibrillation (HCC)    a. 09/2016 in setting of pancreatitis;  b. 09/2016 Echo: EF 65-60%, no rwma, Gr2 DD, mild MR, triv TR, PASP ;  CHA2DS2VASc = 4-->Eliquis 5mg  BID. c. Recurred 06/2018 in setting of GIB.   Chronic diastolic CHF (congestive heart failure) (HCC) 01/28/2018   Diabetes mellitus    GI bleeding 06/2018   Gout    Hyperlipidemia    Hypertension    Hypertriglyceridemia    Pancreatitis    a. 09/2016 - Triglycerides 1,392 on admission.    Past Surgical History:  Procedure Laterality Date   AV NODE ABLATION N/A 01/02/2023   Procedure: AV NODE ABLATION;  Surgeon: Marinus Maw, MD;  Location: [facility] INVASIVE CV LAB;  Service: Cardiovascular;  Laterality: N/A;   BIOPSY  06/20/2018   Procedure: BIOPSY;  Surgeon: Vida Rigger, MD;  Location: [facility] ENDOSCOPY;  Service: Endoscopy;;   CHOLECYSTECTOMY N/A 01/04/2014   Procedure: LAPAROSCOPIC CHOLECYSTECTOMY WITH INTRAOPERATIVE CHOLANGIOGRAM;  Surgeon: Axel Filler, MD;  Location: [facility] OR;  Service: General;  Laterality: N/A;   COLONOSCOPY WITH PROPOFOL  N/A 06/21/2018   Procedure: COLONOSCOPY WITH PROPOFOL;  Surgeon: Bernette Redbird, MD;  Location: [facility] ENDOSCOPY;  Service: Endoscopy;  Laterality: N/A;   CORONARY ARTERY BYPASS GRAFT N/A 03/09/2023   Procedure: CORONARY ARTERY BYPASS GRAFTING (CABG) TIMES THREE USING THE LEFT INTERNAL MAMMARY ARTERY (LIMA) AND ENDOSCOPICALLY HARVESTED RIGHT GREATER SAPHENOUS VEIN;  Surgeon: [name], MD;  Location: [facility] OR;  Service: Open Heart Surgery;  Laterality: N/A;   ESOPHAGOGASTRODUODENOSCOPY (EGD) WITH PROPOFOL N/A 06/20/2018   Procedure:  ESOPHAGOGASTRODUODENOSCOPY (EGD) WITH PROPOFOL;  Surgeon: Vida Rigger, MD;  Location: [facility] ENDOSCOPY;  Service: Endoscopy;  Laterality: N/A;   IR THORACENTESIS ASP PLEURAL SPACE W/IMG GUIDE  03/16/2023   LUNG SURGERY     PACEMAKER IMPLANT N/A 03/03/2022   Procedure: PACEMAKER IMPLANT;  Surgeon: Marinus Maw, MD;  Location: [facility] INVASIVE CV LAB;  Service: Cardiovascular;  Laterality: N/A;   RIGHT HEART CATH N/A 03/06/2023   Procedure: RIGHT HEART CATH;  Surgeon: Laurey Morale, MD;  Location: [facility] INVASIVE CV LAB;  Service: Cardiovascular;  Laterality: N/A;   RIGHT/LEFT HEART CATH AND CORONARY ANGIOGRAPHY N/A 03/03/2023   Procedure: RIGHT/LEFT HEART CATH AND CORONARY ANGIOGRAPHY;  Surgeon: Marykay Lex, MD;  Location: [facility] INVASIVE CV LAB;  Service: Cardiovascular;  Laterality: N/A;   TEE WITHOUT CARDIOVERSION N/A 03/09/2023   Procedure: TRANSESOPHAGEAL ECHOCARDIOGRAM;  Surgeon: [name], MD;  Location: [facility] OR;  Service: Open Heart Surgery;  Laterality: N/A;    Current Medications: Current Meds  Medication Sig   Accu-Chek Softclix Lancets lancets Use to check blood sugar 3 times daily.   acetaminophen (TYLENOL) 325 MG tablet Take 2 tablets (650 mg total) by mouth every 6 (six) hours as needed for mild pain (or Fever >/= 101).   apixaban (ELIQUIS) 5 MG TABS tablet Take 1 tablet (5 mg total) by mouth in the morning and at bedtime.   aspirin EC 81 MG tablet Take 1 tablet (81 mg total) by mouth daily. Swallow whole.   atorvastatin (LIPITOR) 40 MG tablet Take 1 tablet (40 mg total) by mouth daily.   Blood Glucose Monitoring Suppl (ACCU-CHEK GUIDE) w/Device KIT Use to check blood sugar 3 times daily.   Blood Pressure Monitoring (BLOOD PRESSURE MONITOR/ARM) DEVI Check blood pressure once per day at least 2 hours after medications.   Continuous Blood Gluc Sensor (DEXCOM G7 SENSOR) MISC 1 Device by Does not apply route as directed.   diclofenac Sodium (VOLTAREN) 1 % GEL Apply 2 g topically 4 (four)  times daily. Apply across lower back for back pain (Patient taking differently: Apply 2 g topically 4 (four) times daily as needed (pain). Apply across lower back for back pain)   empagliflozin (JARDIANCE) 10 MG TABS tablet Take 1 tablet (10 mg total) by mouth daily.   fenofibrate (TRICOR) 48 MG tablet Take 1 tablet (48 mg total) by mouth daily.   ferrous sulfate 325 (65 FE) MG tablet Take 1 tablet (325 mg total) by mouth daily with breakfast.   glucose blood (TRUE METRIX BLOOD GLUCOSE TEST) test strip Use as instructed   insulin glargine (LANTUS SOLOSTAR) 100 UNIT/ML Solostar Pen Inject 55 Units into the skin daily. (Patient taking differently: Inject 10 Units into the skin daily.)   insulin lispro (HUMALOG KWIKPEN) 100 UNIT/ML KwikPen Max daily 45 units (Patient taking differently: Inject 5 Units into the skin with breakfast, with lunch, and with evening meal.)   Insulin Pen Needle 32G X 4 MM MISC use in the morning, at noon, in  the evening, and at bedtime.   Insulin Syringe-Needle U-100 (TRUEPLUS INSULIN SYRINGE) 31G X 5/16" 0.3 ML MISC Use to inject insulin daily. (Patient taking differently: 1 each by Other route See admin instructions. Use to inject insulin daily.)   lidocaine (LIDODERM) 5 % Place 1 patch onto the skin daily. Remove & Discard patch within 12 hours or as directed by MD   liraglutide (VICTOZA) 18 MG/3ML SOPN Inject 1.8 mg into the skin daily.   Multiple Vitamins-Minerals (CENTRUM SILVER PO) Take 1 tablet by mouth daily.   Multiple Vitamins-Minerals (OCUVITE ADULT 50+) CAPS Take 1 capsule by mouth daily.   pioglitazone (ACTOS) 15 MG tablet Take 1 tablet (15 mg total) by mouth daily.   torsemide (DEMADEX) 20 MG tablet Take 1 tablet (20 mg) once a day, Take extra dose of 20 mg as needed for sudden weight gain and leg swelling (Patient taking differently: Take 20 mg by mouth daily as needed. Take 1 tablet daily as needed on the days weight gain is 3 lb overnight.)     Allergies:    Trulicity [dulaglutide]   Social History   Socioeconomic History   Marital status: Single    Spouse name: Not on file   Number of children: Not on file   Years of education: Not on file   Highest education level: Not on file  Occupational History   Not on file  Tobacco Use   Smoking status: Never   Smokeless tobacco: Never  Vaping Use   Vaping status: Never Used  Substance and Sexual Activity   Alcohol use: No   Drug use: No   Sexual activity: Not Currently  Other Topics Concern   Not on file  Social History Narrative   Lives with her sister and does not need any assistance with ADLs.     Social Drivers of Health   Financial Resource Strain: Medium Risk (05/18/2023)   Overall Financial Resource Strain (CARDIA)    Difficulty of Paying Living Expenses: Somewhat hard  Food Insecurity: Food Insecurity Present (05/18/2023)   Hunger Vital Sign    Worried About Running Out of Food in the Last Year: Sometimes true    Ran Out of Food in the Last Year: Sometimes true  Transportation Needs: No Transportation Needs (05/18/2023)   PRAPARE - Administrator, Civil Service (Medical): No    Lack of Transportation (Non-Medical): No  Recent Concern: Transportation Needs - Unmet Transportation Needs (05/06/2023)   PRAPARE - Transportation    Lack of Transportation (Medical): Yes    Lack of Transportation (Non-Medical): Yes  Physical Activity: Insufficiently Active (05/06/2023)   Exercise Vital Sign    Days of Exercise per Week: 7 days    Minutes of Exercise per Session: 20 min  Stress: No Stress Concern Present (05/18/2023)   [employer] of Occupational Health - Occupational Stress Questionnaire    Feeling of Stress : Not at all  Recent Concern: Stress - Stress Concern Present (05/06/2023)   [employer] of Occupational Health - Occupational Stress Questionnaire    Feeling of Stress : Very much  Social Connections: Socially Isolated (05/18/2023)   Social Connection  and Isolation Panel [NHANES]    Frequency of Communication with Friends and Family: Once a week    Frequency of Social Gatherings with Friends and Family: Once a week    Attends Religious Services: Never    Database administrator or Organizations: No    Attends Banker Meetings: Never  Marital Status: Never married     Family History: The patient's family history includes Diabetes in her sister; Heart attack in her father and mother. There is no history of High blood pressure or High Cholesterol.  ROS:   Please see the history of present illness.     All other systems reviewed and are negative.  EKGs/Labs/Other Studies Reviewed:    The following studies were reviewed today: Cardiac Studies & Procedures   CARDIAC CATHETERIZATION  CARDIAC CATHETERIZATION 03/06/2023  Narrative 1.  Low right atrial pressure. 2. Normal PCWP.  Prominent V-waves but only mild MR by echo, may be due to diastolic dysfunction. 3. Mild primarily pulmonary arterial hypertension.  Significant improvement from prior study.   CARDIAC CATHETERIZATION 03/03/2023  Narrative   Prox RCA lesion is 99% stenosed. Subtotal Occlusion - TIMI 2 flow   Mid RCA lesion is 80% stenosed.  Mid RCA to Dist RCA lesion is 90% stenosed. Dist RCA lesion is 70% stenosed.   Mid Cx lesion is 80% stenosed prior to distal OM-LPL   Mid LAD lesion is 50% stenosed. Dist LAD lesion is 90% stenosed.   2nd Diag lesion is 70% stenosed.   Hemodynamic findings consistent with severe pulmonary hypertension.   There is no aortic valve stenosis.   SUMMARY Multivessel disease-question if she would be a better candidate for CABG versus multivessel PCI given concerns for renal insufficiency and elevated PAP.   RECOMMENDATIONS Patient has multivessel disease, I will start her back on IV heparin 8 hours after sheath removal pending assessment for potential multivessel PCI versus CABG. Gentle hydration with diuresis today, have  consulted advanced heart failure team for assistance in management and TUNE-UP prior to consideration of CVTS consultation versus multivessel PCI.   Case discussed with Drs. Janne Napoleon, Marca Ancona as well as the patient's sister [name].   Bryan Lemma, MD  Findings Coronary Findings Diagnostic  Dominance: Right  Left Anterior Descending Mid LAD lesion is 50% stenosed. Dist LAD lesion is 90% stenosed.  First Diagonal Branch Vessel is small in size.  Second Diagonal Branch Vessel is small in size. 2nd Diag lesion is 70% stenosed.  Second Septal Branch Vessel is small in size.  Left Circumflex Mid Cx lesion is 80% stenosed.  Left Posterior Atrioventricular Artery Vessel is small in size.  Right Coronary Artery Prox RCA lesion is 99% stenosed. Mid RCA lesion is 80% stenosed. Mid RCA to Dist RCA lesion is 90% stenosed. Dist RCA lesion is 70% stenosed.  Acute Marginal Branch Vessel is small in size.  Right Ventricular Branch Vessel is small in size.  Right Posterior Descending Artery Vessel is small in size. TIMI 2 FLOW Collaterals RPDA filled by collaterals from 3rd Sept.  First Right Posterolateral Branch Vessel is small in size.  Intervention  No interventions have been documented.    ECHOCARDIOGRAM  ECHOCARDIOGRAM COMPLETE 03/02/2023  Narrative ECHOCARDIOGRAM REPORT    Patient Name:   Emily Farmer Date of Exam: 03/02/2023 Medical Rec #:  [id number]     Height:       63.0 in Accession #:    [id number]    Weight:       180.8 lb Date of Birth:  24-Feb-1956    BSA:          1.852 m Patient Age:    66 years      BP:           122/69 mmHg Patient Gender: F  HR:           68 bpm. Exam Location:  Inpatient  Procedure: 2D Echo, Cardiac Doppler and Color Doppler  Indications:    Chest Pain  History:        Patient has prior history of Echocardiogram examinations, most recent 03/03/2022. CHF, Pacemaker,  Arrythmias:Atrial Fibrillation; Risk Factors:Dyslipidemia and Hypertension.  Sonographer:    Melton Krebs RDCS, FE, PE Referring Phys: [id number] Cyndi Bender  IMPRESSIONS   1. Left ventricular ejection fraction, by estimation, is 60 to 65%. The left ventricle has normal function. The left ventricle has no regional wall motion abnormalities. Left ventricular diastolic parameters are indeterminate. Elevated left atrial pressure. There is the interventricular septum is flattened in systole and diastole, consistent with right ventricular pressure and volume overload. 2. Right ventricular systolic function is mildly reduced. The right ventricular size is moderately enlarged. There is severely elevated pulmonary artery systolic pressure. 3. Right atrial size was moderately dilated. 4. The mitral valve is normal in structure. Mild mitral valve regurgitation. No evidence of mitral stenosis. 5. Tricuspid valve regurgitation is moderate. 6. The aortic valve is calcified. Aortic valve regurgitation is not visualized. Aortic valve sclerosis/calcification is present, without any evidence of aortic stenosis. 7. The inferior vena cava is dilated in size with <50% respiratory variability, suggesting right atrial pressure of 15 mmHg.  FINDINGS Left Ventricle: Left ventricular ejection fraction, by estimation, is 60 to 65%. The left ventricle has normal function. The left ventricle has no regional wall motion abnormalities. The left ventricular internal cavity size was normal in size. There is no left ventricular hypertrophy. The interventricular septum is flattened in systole and diastole, consistent with right ventricular pressure and volume overload. Left ventricular diastolic parameters are indeterminate. Elevated left atrial pressure.  Right Ventricle: The right ventricular size is moderately enlarged. Right ventricular systolic function is mildly reduced. There is severely elevated pulmonary artery systolic  pressure. The tricuspid regurgitant velocity is 4.19 m/s, and with an assumed right atrial pressure of 15 mmHg, the estimated right ventricular systolic pressure is 85.2 mmHg.  Left Atrium: Left atrial size was normal in size.  Right Atrium: Right atrial size was moderately dilated.  Pericardium: There is no evidence of pericardial effusion.  Mitral Valve: The mitral valve is normal in structure. Mild mitral valve regurgitation. No evidence of mitral valve stenosis.  Tricuspid Valve: The tricuspid valve is normal in structure. Tricuspid valve regurgitation is moderate . No evidence of tricuspid stenosis.  Aortic Valve: The aortic valve is calcified. Aortic valve regurgitation is not visualized. Aortic valve sclerosis/calcification is present, without any evidence of aortic stenosis. Aortic valve mean gradient measures 5.0 mmHg. Aortic valve peak gradient measures 8.8 mmHg. Aortic valve area, by VTI measures 2.02 cm.  Pulmonic Valve: The pulmonic valve was normal in structure. Pulmonic valve regurgitation is not visualized. No evidence of pulmonic stenosis.  Aorta: The aortic root is normal in size and structure.  Venous: The inferior vena cava is dilated in size with less than 50% respiratory variability, suggesting right atrial pressure of 15 mmHg.  IAS/Shunts: No atrial level shunt detected by color flow Doppler.  Additional Comments: A device lead is visualized.   LEFT VENTRICLE PLAX 2D LVIDd:         4.10 cm   Diastology LVIDs:         2.60 cm   LV e' medial:    5.77 cm/s LV PW:         1.00 cm  LV E/e' medial:  20.6 LV IVS:        1.00 cm   LV e' lateral:   5.55 cm/s LVOT diam:     1.80 cm   LV E/e' lateral: 21.4 LV SV:         60 LV SV Index:   33 LVOT Area:     2.54 cm   RIGHT VENTRICLE RV S prime:     8.84 cm/s TAPSE (M-mode): 1.0 cm  LEFT ATRIUM             Index        RIGHT ATRIUM           Index LA diam:        4.00 cm 2.16 cm/m   RA Area:     16.40 cm LA  Vol (A2C):   51.7 ml 27.91 ml/m  RA Volume:   40.90 ml  22.08 ml/m LA Vol (A4C):   70.8 ml 38.22 ml/m LA Biplane Vol: 65.2 ml 35.20 ml/m AORTIC VALVE AV Area (Vmax):    2.06 cm AV Area (Vmean):   1.99 cm AV Area (VTI):     2.02 cm AV Vmax:           148.00 cm/s AV Vmean:          103.000 cm/s AV VTI:            0.299 m AV Peak Grad:      8.8 mmHg AV Mean Grad:      5.0 mmHg LVOT Vmax:         120.00 cm/s LVOT Vmean:        80.700 cm/s LVOT VTI:          0.237 m LVOT/AV VTI ratio: 0.79  AORTA Ao Root diam: 2.80 cm  MITRAL VALVE                TRICUSPID VALVE MV Area (PHT): 3.61 cm     TR Peak grad:   70.2 mmHg MV Decel Time: 210 msec     TR Vmax:        419.00 cm/s MV E velocity: 119.00 cm/s MV A velocity: 30.90 cm/s   SHUNTS MV E/A ratio:  3.85         Systemic VTI:  0.24 m Systemic Diam: 1.80 cm  Olga Millers MD Electronically signed by Olga Millers MD Signature Date/Time: 03/02/2023/3:15:02 PM    Final  TEE  ECHO INTRAOPERATIVE TEE 03/09/2023  Narrative *INTRAOPERATIVE TRANSESOPHAGEAL REPORT *    Patient Name:   [name]  Date of Exam: 03/09/2023 Medical Rec #:  [id number]      Height:       63.0 in Accession #:    [id number]     Weight:       166.4 lb Date of Birth:  Sep 24, 1955     BSA:          1.79 m Patient Age:    66 years       BP:           140/80 mmHg Patient Gender: F              HR:           60 bpm. Exam Location:  Anesthesiology  Transesophogeal exam was perform intraoperatively during surgical procedure. Patient was closely monitored under general anesthesia during the entirety of examination.  Indications:     CABG Performing Phys: [id number] BRYAN K BARTLE Diagnosing  Phys: Jairo Ben MD  Complications: No known complications during this procedure. POST-OP IMPRESSIONS limited post-CPB exam: The patient separated easily from CPB. _ Left Ventricle: The left ventricular function is essentially unchanged from pre-bypass  images. Contractility remains hyperdynamic, with overall EF > 65%. There is septal flattening, indicative of pacing. _ Right Ventricle: The right ventricular function appears normal, unchanged from pre-bypass images. _ Aortic Valve: The aortic valve function appears normal, unchanged from pre-bypass. _ Mitral Valve: With initial separation from CPB, there was moderate mitral regurgitation, however this improved to trivial regurgitation by the end of the case. _ Tricuspid Valve: The tricuspid valve function appears unchanged from pre-bypass. There is trivial TR. _ Pulmonic Valve: The pulmonic valve appears unchanged from pre-bypass images, with mild-moderate insufficiency.  PRE-OP FINDINGS Left Ventricle: The left ventricle has hyperdynamic systolic function, with an ejection fraction of >65%, measured 78%. The cavity size was normal. No evidence of left ventricular regional wall motion abnormalities. There is no left ventricular hypertrophy. Left ventricular diastolic function was not evaluated.  Right Ventricle: The right ventricle has normal systolic function. The cavity was normal. There is no increase in right ventricular wall thickness. Catheter present in the right ventricle.  Left Atrium: Left atrial size was normal in size. No left atrial/left atrial appendage thrombus was detected. Left atrial appendage velocity is reduced at less than 40 cm/s.  Right Atrium: Right atrial size was dilated. Catheter present in the right atrium.  Interatrial Septum: No atrial level shunt detected by color flow Doppler. There is no evidence of a patent foramen ovale.  Pericardium: There is no evidence of pericardial effusion.  Mitral Valve: The mitral valve is normal in structure. Mitral valve regurgitation is mild-moderate by color flow Doppler. There is no evidence of mitral valve vegetation. Pulmonary venous flow is normal. There is no evidence of mitral stenosis, woth mean gradient 1 mmHg, peak  gradient 4 mmHg.  Tricuspid Valve: The tricuspid valve was normal in structure. Tricuspid valve regurgitation is trivial by color flow Doppler. No evidence of tricuspid stenosis is present. There is no evidence of tricuspid valve vegetation.  Aortic Valve: The aortic valve is tricuspid. Aortic valve regurgitation was not visualized by color flow Doppler. There is no stenosis of the aortic valve. There is no evidence of aortic valve vegetation. There is mild thickening present on the aortic valve non-coronary, left coronary and right coronary cusps with normal mobility.  Pulmonic Valve: The pulmonic valve was normal in structure, with normal leaflet excursion. No evidence of pulmonic stenosis. Pulmonic valve regurgitation is moderate by color flow Doppler.   Aorta: The aortic root, ascending aorta and aortic arch are normal in size and structure.  Pulmonary Artery: Theone Murdoch catheter present on the right. The pulmonary artery is moderately dilated.  Venous: The inferior vena cava is dilated in size with less than 50% respiratory variability, suggesting right atrial pressure of 15 mmHg.  Shunts: There is no evidence of an atrial septal defect.  +--------------+-------++ LEFT VENTRICLE        +--------------+-------++ PLAX 2D               +--------------+-------++ LVIDd:        4.25 cm +--------------+-------++ LVIDs:        2.25 cm +--------------+-------++ LV PW:        0.55 cm +--------------+-------++ LV SV:        64 ml   +--------------+-------++ LV SV Index:  34.30   +--------------+-------++                       +--------------+-------++  +-------------+-----------++  AORTIC VALVE             +-------------+-----------++ AV Vmax:     118.00 cm/s +-------------+-----------++ AV Vmean:    75.400 cm/s +-------------+-----------++ AV VTI:      0.258 m     +-------------+-----------++ AV Peak Grad:5.6 mmHg     +-------------+-----------++ AV Mean Grad:2.5 mmHg    +-------------+-----------++  +-------------+---------++ MITRAL VALVE           +-------------+---------++ MV Peak grad:3.8 mmHg  +-------------+---------++ MV Mean grad:1.0 mmHg  +-------------+---------++ MV Vmax:     0.98 m/s  +-------------+---------++ MV Vmean:    50.5 cm/s +-------------+---------++ MV VTI:      0.24 m    +-------------+---------++   Jairo Ben MD Electronically signed by Jairo Ben MD Signature Date/Time: 03/09/2023/3:46:10 PM    Final                  Recent Labs: 03/10/2023: Magnesium 2.5 04/13/2023: B Natriuretic Peptide 223.5 05/13/2023: ALT 14; BUN 57; Creatinine, Ser 1.48; Hemoglobin 11.6; Platelets 196; Potassium 3.9; Sodium 138  Recent Lipid Panel    Component Value Date/Time   CHOL 95 03/02/2023 0422   CHOL 351 (H) 11/18/2022 1216   TRIG 121 03/02/2023 0422   HDL 39 (L) 03/02/2023 0422   HDL 31 (L) 11/18/2022 1216   CHOLHDL 2.4 03/02/2023 0422   VLDL 24 03/02/2023 0422   LDLCALC 32 03/02/2023 0422   LDLCALC Comment (A) 11/18/2022 1216   LDLDIRECT 137 (H) 03/01/2020 1012   LDLDIRECT 96.0 09/23/2019 0828     Risk Assessment/Calculations:    CHA2DS2-VASc Score = 5   This indicates a 7.2% annual risk of stroke. The patient's score is based upon: CHF History: 0 HTN History: 1 Diabetes History: 1 Stroke History: 0 Vascular Disease History: 1 Age Score: 1 Gender Score: 1             Physical Exam:    VS:  BP 124/76   Pulse 94   Ht 5\' 3"  (1.6 m)   Wt 167 lb 3.2 oz (75.8 kg)   SpO2 94%   BMI 29.62 kg/m     Wt Readings from Last 3 Encounters:  06/04/23 167 lb 3.2 oz (75.8 kg)  05/18/23 167 lb 1.6 oz (75.8 kg)  05/13/23 163 lb (73.9 kg)     GEN:  Well nourished, well developed in no acute distress HEENT: Normal NECK: No JVD; No carotid bruits LYMPHATICS: No lymphadenopathy CARDIAC: RRR, no murmurs, rubs,  gallops RESPIRATORY:  Clear to auscultation without rales, wheezing or rhonchi  ABDOMEN: Soft, non-tender, non-distended MUSCULOSKELETAL:  No edema; No deformity  SKIN: Warm and dry NEUROLOGIC:  Alert and oriented x 3 PSYCHIATRIC:  Normal affect   ASSESSMENT:    1. S/P CABG x 3   2. Chronic a-fib (HCC)   3. Pacemaker   4. Chronic diastolic CHF (congestive heart failure) (HCC)   5. Essential hypertension   6. Chronic anticoagulation   7. Stage 3b chronic kidney disease (HCC)    PLAN:    In order of problems listed above:  CAD s/p CABG x 3 02/2023 - Remains on 81 mg aspirin, 40 mg Lipitor -No chest pain, sternotomy healing very well   Permanent atrial fibrillation Chronic anticoagulation PPM in place - Remains on 5 mg Eliquis twice daily -No longer on 100 mg Toprol, 120 mg Cardizem -Heart rate today in the 90s - I am not sure what her heart rate has been running, she has not  had a device interrogation since August - I asked her to do a manual transmission when she got home, she states that the machine does not work due to an Internet problem - I will message the device clinic to see if we can help with getting a transmission -- I will hold off on starting Toprol at this time   Hyperlipidemia with LDL goal less than 55 - Given use of SVG to LAD and insulin-dependent diabetes, would recommend a lower LDL goal -Has been maintained on 40 mg Lipitor, 40 mg fenofibrate 03/02/2023: Cholesterol 95; HDL 39; LDL Cholesterol 32; Triglycerides 121; VLDL 24 Consider drawing and LPA with next labs   Pulmonary hypertension Chronic diastolic heart failure AKI-unclear if she now has a component of CKD, SCr 1.48 - It is very unclear what diuretic regimen she is taking at home - She tells me that she took furosemide 20 mg Monday and Tuesday, but took torsemide this morning - She remembers being told to stop her furosemide, she was told to take torsemide 2 days a week - I think until we  have a better handle on her volume status and diuretic regimen, I will discontinue completely furosemide and have advised her to take 20 mg of torsemide as needed for 3 pound weight gain overnight - She will bring a blood pressure, heart rate, and weight log to her next visit so that we can better understand her volume and diuretic needs -I suspect her AKI may be related to taking furosemide and torsemide on the same days   Follow up in 1 month.     Cardiac Rehabilitation Eligibility Assessment  The patient is ready to start cardiac rehabilitation pending clearance from the cardiac surgeon.          Medication Adjustments/Labs and Tests Ordered: Current medicines are reviewed at length with the patient today.  Concerns regarding medicines are outlined above.  No orders of the defined types were placed in this encounter.  No orders of the defined types were placed in this encounter.   Patient Instructions  Medication Instructions:  Stop Furosemide (Lasix) as directed. Torsemide 20 mg- take one tablet daily only if you have the 3 lb weight gain overnight.  *If you need a refill on your cardiac medications before your next appointment, please call your pharmacy*   Lab Work: NONE ordered at this time of appointment    Testing/Procedures: NONE ordered at this time of appointment    Follow-Up: At [facility], you and your health needs are our priority.  As part of our continuing mission to provide you with exceptional heart care, we have created designated Provider Care Teams.  These Care Teams include your primary Cardiologist (physician) and Advanced Practice Providers (APPs -  Physician Assistants and Nurse Practitioners) who all work together to provide you with the care you need, when you need it.  We recommend signing up for the patient portal called "MyChart".  Sign up information is provided on this After Visit Summary.  MyChart is used to connect with patients  for Virtual Visits (Telemedicine).  Patients are able to view lab/test results, encounter notes, upcoming appointments, etc.  Non-urgent messages can be sent to your provider as well.   To learn more about what you can do with MyChart, go to [email or web address].    Your next appointment:   1 month(s)  Provider:   Micah Flesher, PA-C        Other Instructions Please document blood pressure, heart  rate and the days you take Torsemide on the blood pressure sheet given.        Signed, Marcelino Duster, PA  06/04/2023 11:41 AM    [facility]

## 2023-05-25 NOTE — Telephone Encounter (Signed)
Spoke with patient advisement given per pcp "cut back on taking the fluid pill to just once a week. However if she notices any increased swelling in the legs, she should increase it again to twice a week. " Patient voiced understanding of all discussed .

## 2023-05-25 NOTE — Telephone Encounter (Signed)
noted 

## 2023-05-25 NOTE — Telephone Encounter (Signed)
Have her cut back on taking the fluid pill to just once a week.  However if she notices any increased swelling in the legs, she should increase it again to twice a week.

## 2023-05-26 ENCOUNTER — Other Ambulatory Visit: Payer: Self-pay

## 2023-05-26 ENCOUNTER — Ambulatory Visit: Payer: Self-pay | Admitting: *Deleted

## 2023-05-26 ENCOUNTER — Telehealth: Payer: Self-pay | Admitting: Internal Medicine

## 2023-05-26 DIAGNOSIS — I480 Paroxysmal atrial fibrillation: Secondary | ICD-10-CM | POA: Diagnosis not present

## 2023-05-26 DIAGNOSIS — E1122 Type 2 diabetes mellitus with diabetic chronic kidney disease: Secondary | ICD-10-CM | POA: Diagnosis not present

## 2023-05-26 DIAGNOSIS — I051 Rheumatic mitral insufficiency: Secondary | ICD-10-CM | POA: Diagnosis not present

## 2023-05-26 DIAGNOSIS — Z48812 Encounter for surgical aftercare following surgery on the circulatory system: Secondary | ICD-10-CM | POA: Diagnosis not present

## 2023-05-26 DIAGNOSIS — I130 Hypertensive heart and chronic kidney disease with heart failure and stage 1 through stage 4 chronic kidney disease, or unspecified chronic kidney disease: Secondary | ICD-10-CM | POA: Diagnosis not present

## 2023-05-26 DIAGNOSIS — N1832 Chronic kidney disease, stage 3b: Secondary | ICD-10-CM | POA: Diagnosis not present

## 2023-05-26 DIAGNOSIS — I5032 Chronic diastolic (congestive) heart failure: Secondary | ICD-10-CM | POA: Diagnosis not present

## 2023-05-26 DIAGNOSIS — I2510 Atherosclerotic heart disease of native coronary artery without angina pectoris: Secondary | ICD-10-CM | POA: Diagnosis not present

## 2023-05-26 DIAGNOSIS — I495 Sick sinus syndrome: Secondary | ICD-10-CM | POA: Diagnosis not present

## 2023-05-26 NOTE — Telephone Encounter (Signed)
Reason for Disposition  [1] Systolic BP < 90 AND [2] NOT dizzy, lightheaded or weak  Answer Assessment - Initial Assessment Questions 1. BLOOD PRESSURE: "What is the blood pressure?" "Did you take at least two measurements 5 minutes apart?"     90/60, 94/59 2. ONSET: "When did you take your blood pressure?"     11:00 3. HOW: "How did you obtain the blood pressure?" (e.g., visiting nurse, automatic home BP monitor)     Both- automatic/manual 4. HISTORY: "Do you have a history of low blood pressure?" "What is your blood pressure normally?"     After visit on Monday- 135/90 5. MEDICINES: "Are you taking any medications for blood pressure?" If Yes, ask: "Have they been changed recently?"     No BP medication  6. PULSE RATE: "Do you know what your pulse rate is?"      72 7. OTHER SYMPTOMS: "Have you been sick recently?" "Have you had a recent injury?"     No chnages  Protocols used: Blood Pressure - Low-A-AH

## 2023-05-26 NOTE — Telephone Encounter (Signed)
Copied from CRM [id number]. Topic: General - Inquiry >> May 26, 2023 12:47 PM Lennox Pippins wrote: Patient called and wanted to let PCP know that BP readings for today are:  120/93  120/83  PULSE RATE 72 -- most current reading   No Symptoms.  Patients callback # [phone]

## 2023-05-26 NOTE — Telephone Encounter (Signed)
  Chief Complaint: low BP reading- home health care nurse is calling to report low BP reading Symptoms: asymptomatic Frequency: taken twice- 90/60, 94/59- patient reports her BP has been low since her visit Monday Pertinent Negatives: Patient denies illness, injury Disposition: [] ED /[] Urgent Care (no appt availability in office) / [] Appointment(In office/virtual)/ []  [place] [facility]/ [] Home Care/ [] Refused Recommended Disposition /[]  Mobile Bus/ [x]  Follow-up with PCP Additional Notes: Patient nurse is calling to report low BP -  Julaine Fusi- [phone]

## 2023-05-26 NOTE — Telephone Encounter (Signed)
Spoke with patient . Patient voiced she has n not needed to take her lasix . Reports she is having low BP's . Patient took BP while on the phone with me . Reports Bp of 93/64 sitting.. Denies dizziness. Voices she is staying hydrated and the BP machine is only two months old. Advised that she should go to our MU for assessment. Patient voiced that she wanted this message sent to Provider to seek their advice.

## 2023-05-27 DIAGNOSIS — I495 Sick sinus syndrome: Secondary | ICD-10-CM | POA: Diagnosis not present

## 2023-05-27 DIAGNOSIS — I2510 Atherosclerotic heart disease of native coronary artery without angina pectoris: Secondary | ICD-10-CM | POA: Diagnosis not present

## 2023-05-27 DIAGNOSIS — I130 Hypertensive heart and chronic kidney disease with heart failure and stage 1 through stage 4 chronic kidney disease, or unspecified chronic kidney disease: Secondary | ICD-10-CM | POA: Diagnosis not present

## 2023-05-27 DIAGNOSIS — I480 Paroxysmal atrial fibrillation: Secondary | ICD-10-CM | POA: Diagnosis not present

## 2023-05-27 DIAGNOSIS — N1832 Chronic kidney disease, stage 3b: Secondary | ICD-10-CM | POA: Diagnosis not present

## 2023-05-27 DIAGNOSIS — I5032 Chronic diastolic (congestive) heart failure: Secondary | ICD-10-CM | POA: Diagnosis not present

## 2023-05-27 DIAGNOSIS — Z48812 Encounter for surgical aftercare following surgery on the circulatory system: Secondary | ICD-10-CM | POA: Diagnosis not present

## 2023-05-27 DIAGNOSIS — I051 Rheumatic mitral insufficiency: Secondary | ICD-10-CM | POA: Diagnosis not present

## 2023-05-27 DIAGNOSIS — E1122 Type 2 diabetes mellitus with diabetic chronic kidney disease: Secondary | ICD-10-CM | POA: Diagnosis not present

## 2023-05-27 NOTE — Telephone Encounter (Signed)
Hold torsemide.  Take it only when she has increased swelling in the legs.  Keep upcoming appointment with cardiology.

## 2023-05-27 NOTE — Telephone Encounter (Signed)
Spoke with  patient . Advisement per PCP as follows Hold torsemide.  Take it only when she has increased swelling in the legs.  Keep upcoming appointment with cardiology. Patient voiced understanding of all discussed .

## 2023-05-28 ENCOUNTER — Telehealth: Payer: Self-pay

## 2023-05-28 NOTE — Telephone Encounter (Signed)
Copied from CRM [id number]. Topic: General - Other >> May 28, 2023  9:42 AM Franchot Heidelberg wrote: Reason for CRM:   "155.2 lbs today"   Pt called to update her PCP about her weight today/.

## 2023-05-29 NOTE — Telephone Encounter (Signed)
Noted Lanier Ensign

## 2023-06-01 ENCOUNTER — Ambulatory Visit: Payer: Self-pay | Admitting: *Deleted

## 2023-06-01 ENCOUNTER — Encounter: Payer: Self-pay | Admitting: Internal Medicine

## 2023-06-01 DIAGNOSIS — I5032 Chronic diastolic (congestive) heart failure: Secondary | ICD-10-CM | POA: Diagnosis not present

## 2023-06-01 DIAGNOSIS — N1832 Chronic kidney disease, stage 3b: Secondary | ICD-10-CM | POA: Diagnosis not present

## 2023-06-01 DIAGNOSIS — I480 Paroxysmal atrial fibrillation: Secondary | ICD-10-CM | POA: Diagnosis not present

## 2023-06-01 DIAGNOSIS — H35033 Hypertensive retinopathy, bilateral: Secondary | ICD-10-CM | POA: Diagnosis not present

## 2023-06-01 DIAGNOSIS — H02833 Dermatochalasis of right eye, unspecified eyelid: Secondary | ICD-10-CM | POA: Diagnosis not present

## 2023-06-01 DIAGNOSIS — I495 Sick sinus syndrome: Secondary | ICD-10-CM | POA: Diagnosis not present

## 2023-06-01 DIAGNOSIS — E1122 Type 2 diabetes mellitus with diabetic chronic kidney disease: Secondary | ICD-10-CM | POA: Diagnosis not present

## 2023-06-01 DIAGNOSIS — I051 Rheumatic mitral insufficiency: Secondary | ICD-10-CM | POA: Diagnosis not present

## 2023-06-01 DIAGNOSIS — D631 Anemia in chronic kidney disease: Secondary | ICD-10-CM | POA: Diagnosis not present

## 2023-06-01 DIAGNOSIS — I130 Hypertensive heart and chronic kidney disease with heart failure and stage 1 through stage 4 chronic kidney disease, or unspecified chronic kidney disease: Secondary | ICD-10-CM | POA: Diagnosis not present

## 2023-06-01 DIAGNOSIS — I2510 Atherosclerotic heart disease of native coronary artery without angina pectoris: Secondary | ICD-10-CM | POA: Diagnosis not present

## 2023-06-01 DIAGNOSIS — H02836 Dermatochalasis of left eye, unspecified eyelid: Secondary | ICD-10-CM | POA: Diagnosis not present

## 2023-06-01 DIAGNOSIS — E113511 Type 2 diabetes mellitus with proliferative diabetic retinopathy with macular edema, right eye: Secondary | ICD-10-CM | POA: Diagnosis not present

## 2023-06-01 NOTE — Telephone Encounter (Signed)
  Chief Complaint: Medication Question Symptoms: Mild SOB with exertion, mild ankle swelling, Weight gain 4lbs in 1 day Frequency:  Pertinent Negatives: Patient denies  Disposition: [] ED /[] Urgent Care (no appt availability in office) / [] Appointment(In office/virtual)/ []  [place] [facility]/ [] Home Care/ [] Refused Recommended Disposition /[] [place] Mobile Bus/ [x]  Follow-up with PCP Additional Notes:   Emily Farmer OT with [facility] calling. Pt present. States Pt 's lasix was decreased to one tab weekly on 05/26/23. States was then advised to hold all together until sees OB/GYN 06/04/23. Reports 5lb total weight gain since 14th. Team message also sent to [facility] to alert. Please advise. [phone] Reason for Disposition  [1] Caller has URGENT medicine question about med that PCP or specialist prescribed AND [2] triager unable to answer question  Answer Assessment - Initial Assessment Questions 1. NAME of MEDICINE: "What medicine(s) are you calling about?"     Lasix 2. QUESTION: "What is your question?" (e.g., double dose of medicine, side effect)     Should start taking again?  Protocols used: Medication Question Call-A-AH

## 2023-06-01 NOTE — Telephone Encounter (Signed)
Spoke with patient . Advised that she resume Torsemide as she was before and call us back on tomorrow with her weight. Patient denies any S/S.  Patient has MD appointment on Wednesday. Voiced BP is improved 110/60

## 2023-06-02 DIAGNOSIS — I051 Rheumatic mitral insufficiency: Secondary | ICD-10-CM | POA: Diagnosis not present

## 2023-06-02 DIAGNOSIS — I2510 Atherosclerotic heart disease of native coronary artery without angina pectoris: Secondary | ICD-10-CM | POA: Diagnosis not present

## 2023-06-02 DIAGNOSIS — I130 Hypertensive heart and chronic kidney disease with heart failure and stage 1 through stage 4 chronic kidney disease, or unspecified chronic kidney disease: Secondary | ICD-10-CM | POA: Diagnosis not present

## 2023-06-02 DIAGNOSIS — I5032 Chronic diastolic (congestive) heart failure: Secondary | ICD-10-CM | POA: Diagnosis not present

## 2023-06-02 DIAGNOSIS — D631 Anemia in chronic kidney disease: Secondary | ICD-10-CM | POA: Diagnosis not present

## 2023-06-02 DIAGNOSIS — N1832 Chronic kidney disease, stage 3b: Secondary | ICD-10-CM | POA: Diagnosis not present

## 2023-06-02 DIAGNOSIS — E1122 Type 2 diabetes mellitus with diabetic chronic kidney disease: Secondary | ICD-10-CM | POA: Diagnosis not present

## 2023-06-02 DIAGNOSIS — I480 Paroxysmal atrial fibrillation: Secondary | ICD-10-CM | POA: Diagnosis not present

## 2023-06-02 DIAGNOSIS — I495 Sick sinus syndrome: Secondary | ICD-10-CM | POA: Diagnosis not present

## 2023-06-02 NOTE — Telephone Encounter (Signed)
Spoke with patient . Per Patient weight today 166.04 compared to 176 on yesterday and BP 135/60. Patient denies any s/s . Patient has appointment with MD on tomorrow.

## 2023-06-03 ENCOUNTER — Other Ambulatory Visit: Payer: Self-pay

## 2023-06-03 ENCOUNTER — Telehealth: Payer: Self-pay | Admitting: Internal Medicine

## 2023-06-03 ENCOUNTER — Other Ambulatory Visit (HOSPITAL_COMMUNITY): Payer: Self-pay

## 2023-06-03 NOTE — Telephone Encounter (Signed)
Copied from CRM [id number]. Topic: General - Other >> Jun 03, 2023  8:21 AM Phill Myron wrote: Per Pt. Mathieu her Weight  is now 162.4

## 2023-06-03 NOTE — Telephone Encounter (Signed)
[facility] called and spoke to Cayce, Pensions consultant about the refill(s) requested. He says they are all there, but she picked up the Lantus 28 days ago so should have enough on hand until February. He says liraglutide is ready for pickup, and the test strips he can have those ready for pickup in a quantity of 50 per insurance.

## 2023-06-03 NOTE — Telephone Encounter (Signed)
Call placed to patient unable to reach message left on VM.   Message to let patient know it on to stop call in her weight now that she has gone to her MD appointment today. Please let patient know this when she returns call

## 2023-06-03 NOTE — Telephone Encounter (Signed)
Medication Refill -  Most Recent Primary Care Visit:  Provider: Jonah Blue B  Department: [facility]  Visit Type: HOSPITAL FU  Date: 05/18/2023  Medication: liraglutide (VICTOZA) 18 MG/3ML SOPN [161096  insulin glargine (LANTUS SOLOSTAR) 100 UNIT/ML Solostar Pen [04540  glucose blood (TRUE METRIX BLOOD GLUCOSE TEST) test strip [98119   Has the patient contacted their pharmacy? Yes   Is this the correct pharmacy for this prescription? Yes If no, delete pharmacy and type the correct one.  This is the patient's preferred pharmacy:   [facility] [address] [place] [address] Phone: [phone] Fax: [phone]   Has the prescription been filled recently? Yes  Is the patient out of the medication? Yes  Has the patient been seen for an appointment in the last year OR does the patient have an upcoming appointment? Yes  Can we respond through MyChart? No

## 2023-06-03 NOTE — Telephone Encounter (Signed)
Noted  

## 2023-06-03 NOTE — Telephone Encounter (Signed)
Copied from CRM [id number]. Topic: General - Other >> Jun 03, 2023 12:41 PM Epimenio Foot F wrote: Reason for CRM: Pt is calling in because she wants to let Dr. Laural Benes know that her appointment for her heart wasn't today it's tomorrow.

## 2023-06-03 NOTE — Telephone Encounter (Signed)
Let pt know that she can stop calling in the weights now.  Keep appt with cardiologist tomorrow.

## 2023-06-04 ENCOUNTER — Ambulatory Visit: Payer: Medicare HMO | Attending: Physician Assistant | Admitting: Physician Assistant

## 2023-06-04 ENCOUNTER — Encounter: Payer: Self-pay | Admitting: Physician Assistant

## 2023-06-04 ENCOUNTER — Other Ambulatory Visit: Payer: Self-pay

## 2023-06-04 VITALS — BP 124/76 | HR 94 | Ht 63.0 in | Wt 167.2 lb

## 2023-06-04 DIAGNOSIS — N1832 Chronic kidney disease, stage 3b: Secondary | ICD-10-CM

## 2023-06-04 DIAGNOSIS — I10 Essential (primary) hypertension: Secondary | ICD-10-CM | POA: Diagnosis not present

## 2023-06-04 DIAGNOSIS — I5032 Chronic diastolic (congestive) heart failure: Secondary | ICD-10-CM | POA: Diagnosis not present

## 2023-06-04 DIAGNOSIS — Z951 Presence of aortocoronary bypass graft: Secondary | ICD-10-CM

## 2023-06-04 DIAGNOSIS — Z7901 Long term (current) use of anticoagulants: Secondary | ICD-10-CM

## 2023-06-04 DIAGNOSIS — Z950 Presence of cardiac pacemaker: Secondary | ICD-10-CM | POA: Diagnosis not present

## 2023-06-04 DIAGNOSIS — I482 Chronic atrial fibrillation, unspecified: Secondary | ICD-10-CM | POA: Diagnosis not present

## 2023-06-04 NOTE — Patient Instructions (Signed)
Medication Instructions:  Stop Furosemide (Lasix) as directed. Torsemide 20 mg- take one tablet daily only if you have the 3 lb weight gain overnight.  *If you need a refill on your cardiac medications before your next appointment, please call your pharmacy*   Lab Work: NONE ordered at this time of appointment    Testing/Procedures: NONE ordered at this time of appointment    Follow-Up: At [facility], you and your health needs are our priority.  As part of our continuing mission to provide you with exceptional heart care, we have created designated Provider Care Teams.  These Care Teams include your primary Cardiologist (physician) and Advanced Practice Providers (APPs -  Physician Assistants and Nurse Practitioners) who all work together to provide you with the care you need, when you need it.  We recommend signing up for the patient portal called "MyChart".  Sign up information is provided on this After Visit Summary.  MyChart is used to connect with patients for Virtual Visits (Telemedicine).  Patients are able to view lab/test results, encounter notes, upcoming appointments, etc.  Non-urgent messages can be sent to your provider as well.   To learn more about what you can do with MyChart, go to [email or web address].    Your next appointment:   1 month(s)  Provider:   Micah Flesher, PA-C        Other Instructions Please document blood pressure, heart rate and the days you take Torsemide on the blood pressure sheet given.

## 2023-06-05 ENCOUNTER — Ambulatory Visit: Payer: Self-pay | Admitting: Licensed Clinical Social Worker

## 2023-06-05 NOTE — Patient Outreach (Signed)
  Care Coordination   Follow Up Visit Note   06/05/2023 Name: Emily Farmer MRN: [id number] DOB: Mar 31, 1956  Emily Farmer is a 67 y.o. year old female who sees Marcine Matar, MD for primary care. I spoke with  [name] by phone today.  What matters to the patients health and wellness today?  Food Insecurities and housing resources mailed     Goals Addressed             This Visit's Progress    Care Coordination Activities       Care Coordination Interventions: Patient stated that she did not receive the resources and he Sw stated that she will send the request for them to be mailed out again.   Sw went over the SDOH Questions and no other needs at this time SW will follow up in 2 weeks on 06/25/2023 at 1:15 pm        SDOH assessments and interventions completed:  Yes  SDOH Interventions Today    Flowsheet Row Most Recent Value  SDOH Interventions   Food Insecurity Interventions Community Resources Provided  [SW will mail out resoruces again]  Housing Interventions Intervention Not Indicated  Transportation Interventions Intervention Not Indicated  Utilities Interventions Intervention Not Indicated        Care Coordination Interventions:  Yes, provided   Follow up plan: Follow up call scheduled for 06/25/2023 at 1:15 pm    Encounter Outcome:  Patient Visit Completed   .c

## 2023-06-05 NOTE — Patient Instructions (Signed)
Visit Information  Thank you for taking time to visit with me today. Please don't hesitate to contact me if I can be of assistance to you.   Following are the goals we discussed today:   Goals Addressed             This Visit's Progress    Care Coordination Activities       Care Coordination Interventions: Patient stated that she did not receive the resources and he Sw stated that she will send the request for them to be mailed out again.   Sw went over the SDOH Questions and no other needs at this time SW will follow up in 2 weeks on 06/25/2023 at 1:15 pm        Our next appointment is by telephone on 06/25/2023 at 1:15 pm  Please call the care guide team at [phone] if you need to cancel or reschedule your appointment.   If you are experiencing a Mental Health or Behavioral Health Crisis or need someone to talk to, please call the Suicide and Crisis Lifeline: 988 go to [facility] [address] [place] [phone]) call 911  The patient verbalized understanding of instructions, educational materials, and care plan provided today and DECLINED offer to receive copy of patient instructions, educational materials, and care plan.   Jeanie Cooks, PhD [facility], [facility] Social Worker Direct Dial: [phone]  Fax: [phone]

## 2023-06-08 ENCOUNTER — Ambulatory Visit: Payer: Medicare HMO

## 2023-06-09 ENCOUNTER — Other Ambulatory Visit: Payer: Self-pay

## 2023-06-15 ENCOUNTER — Other Ambulatory Visit: Payer: Self-pay

## 2023-06-15 DIAGNOSIS — N1832 Chronic kidney disease, stage 3b: Secondary | ICD-10-CM | POA: Diagnosis not present

## 2023-06-15 DIAGNOSIS — I2510 Atherosclerotic heart disease of native coronary artery without angina pectoris: Secondary | ICD-10-CM | POA: Diagnosis not present

## 2023-06-15 DIAGNOSIS — I051 Rheumatic mitral insufficiency: Secondary | ICD-10-CM | POA: Diagnosis not present

## 2023-06-15 DIAGNOSIS — I480 Paroxysmal atrial fibrillation: Secondary | ICD-10-CM | POA: Diagnosis not present

## 2023-06-15 DIAGNOSIS — I495 Sick sinus syndrome: Secondary | ICD-10-CM | POA: Diagnosis not present

## 2023-06-15 DIAGNOSIS — I130 Hypertensive heart and chronic kidney disease with heart failure and stage 1 through stage 4 chronic kidney disease, or unspecified chronic kidney disease: Secondary | ICD-10-CM | POA: Diagnosis not present

## 2023-06-15 DIAGNOSIS — E1122 Type 2 diabetes mellitus with diabetic chronic kidney disease: Secondary | ICD-10-CM | POA: Diagnosis not present

## 2023-06-15 DIAGNOSIS — D631 Anemia in chronic kidney disease: Secondary | ICD-10-CM | POA: Diagnosis not present

## 2023-06-15 DIAGNOSIS — I5032 Chronic diastolic (congestive) heart failure: Secondary | ICD-10-CM | POA: Diagnosis not present

## 2023-06-18 ENCOUNTER — Other Ambulatory Visit: Payer: Self-pay

## 2023-06-23 ENCOUNTER — Ambulatory Visit: Payer: Self-pay

## 2023-06-23 DIAGNOSIS — I130 Hypertensive heart and chronic kidney disease with heart failure and stage 1 through stage 4 chronic kidney disease, or unspecified chronic kidney disease: Secondary | ICD-10-CM | POA: Diagnosis not present

## 2023-06-23 DIAGNOSIS — I2510 Atherosclerotic heart disease of native coronary artery without angina pectoris: Secondary | ICD-10-CM | POA: Diagnosis not present

## 2023-06-23 DIAGNOSIS — I051 Rheumatic mitral insufficiency: Secondary | ICD-10-CM | POA: Diagnosis not present

## 2023-06-23 DIAGNOSIS — E1122 Type 2 diabetes mellitus with diabetic chronic kidney disease: Secondary | ICD-10-CM | POA: Diagnosis not present

## 2023-06-23 DIAGNOSIS — I495 Sick sinus syndrome: Secondary | ICD-10-CM | POA: Diagnosis not present

## 2023-06-23 DIAGNOSIS — D631 Anemia in chronic kidney disease: Secondary | ICD-10-CM | POA: Diagnosis not present

## 2023-06-23 DIAGNOSIS — I5032 Chronic diastolic (congestive) heart failure: Secondary | ICD-10-CM | POA: Diagnosis not present

## 2023-06-23 DIAGNOSIS — I480 Paroxysmal atrial fibrillation: Secondary | ICD-10-CM | POA: Diagnosis not present

## 2023-06-23 DIAGNOSIS — N1832 Chronic kidney disease, stage 3b: Secondary | ICD-10-CM | POA: Diagnosis not present

## 2023-06-23 NOTE — Telephone Encounter (Signed)
     Chief Complaint: Nena, home health nurse reports BP today 90/60. Pulse 72, Glucose 101, weight stable. Symptoms: None Frequency: Today Pertinent Negatives: Patient denies any symptoms Disposition: [] ED /[] Urgent Care (no appt availability in office) / [] Appointment(In office/virtual)/ []  [place] [facility]/ [] Home Care/ [] Refused Recommended Disposition /[] [place] Mobile Bus/ [x]  Follow-up with PCP Additional Notes: Wants PCP aware.  Reason for Disposition  [1] Fall in systolic BP > 20 mm Hg from normal AND [2] NOT dizzy, lightheaded, or weak  Answer Assessment - Initial Assessment Questions 1. BLOOD PRESSURE: What is the blood pressure? Did you take at least two measurements 5 minutes apart?     90/60  Pulse 72 2. ONSET: When did you take your blood pressure?     Today 3. HOW: How did you obtain the blood pressure? (e.g., visiting nurse, automatic home BP monitor)     Home health nurse 4. HISTORY: Do you have a history of low blood pressure? What is your blood pressure normally?     No 5. MEDICINES: Are you taking any medications for blood pressure? If Yes, ask: Have they been changed recently?     No 6. PULSE RATE: Do you know what your pulse rate is?      72 7. OTHER SYMPTOMS: Have you been sick recently? Have you had a recent injury?     No 8. PREGNANCY: Is there any chance you are pregnant? When was your last menstrual period?     No  Protocols used: Blood Pressure - Low-A-AH

## 2023-06-24 ENCOUNTER — Other Ambulatory Visit: Payer: Self-pay

## 2023-06-24 ENCOUNTER — Ambulatory Visit (INDEPENDENT_AMBULATORY_CARE_PROVIDER_SITE_OTHER): Payer: Medicare HMO | Admitting: Internal Medicine

## 2023-06-24 ENCOUNTER — Other Ambulatory Visit (HOSPITAL_COMMUNITY): Payer: Self-pay

## 2023-06-24 ENCOUNTER — Encounter: Payer: Self-pay | Admitting: Internal Medicine

## 2023-06-24 VITALS — BP 124/76 | HR 94

## 2023-06-24 DIAGNOSIS — E119 Type 2 diabetes mellitus without complications: Secondary | ICD-10-CM | POA: Insufficient documentation

## 2023-06-24 DIAGNOSIS — Z794 Long term (current) use of insulin: Secondary | ICD-10-CM

## 2023-06-24 DIAGNOSIS — E1142 Type 2 diabetes mellitus with diabetic polyneuropathy: Secondary | ICD-10-CM

## 2023-06-24 DIAGNOSIS — E1159 Type 2 diabetes mellitus with other circulatory complications: Secondary | ICD-10-CM

## 2023-06-24 DIAGNOSIS — E1165 Type 2 diabetes mellitus with hyperglycemia: Secondary | ICD-10-CM

## 2023-06-24 LAB — POCT GLYCOSYLATED HEMOGLOBIN (HGB A1C): Hemoglobin A1C: 9.1 % — AB (ref 4.0–5.6)

## 2023-06-24 MED ORDER — DEXCOM G7 SENSOR MISC: 1.0000 | 3 refills | Status: DC

## 2023-06-24 MED ORDER — LANTUS SOLOSTAR 100 UNIT/ML ~~LOC~~ SOPN
20.0000 [IU] | PEN_INJECTOR | Freq: Every day | SUBCUTANEOUS | 3 refills | Status: DC
  Filled 2023-06-24: qty 30, 150d supply, fill #0

## 2023-06-24 MED ORDER — INSULIN LISPRO (1 UNIT DIAL) 100 UNIT/ML (KWIKPEN)
PEN_INJECTOR | SUBCUTANEOUS | 2 refills | Status: DC
  Filled 2023-06-24: qty 45, fill #0
  Filled 2023-08-07: qty 39, 86d supply, fill #0

## 2023-06-24 MED ORDER — INSULIN PEN NEEDLE 32G X 4 MM MISC
1.0000 | Freq: Four times a day (QID) | 3 refills | Status: DC
  Filled 2023-06-24: qty 400, 100d supply, fill #0
  Filled 2023-07-20 (×2): qty 300, 75d supply, fill #0

## 2023-06-24 MED ORDER — EMPAGLIFLOZIN 25 MG PO TABS
25.0000 mg | ORAL_TABLET | Freq: Every day | ORAL | 3 refills | Status: AC
  Filled 2023-06-24 (×2): qty 90, 90d supply, fill #0
  Filled 2023-10-07: qty 90, 90d supply, fill #1
  Filled 2024-05-25: qty 90, 90d supply, fill #2

## 2023-06-24 MED ORDER — LIRAGLUTIDE 18 MG/3ML ~~LOC~~ SOPN
1.8000 mg | PEN_INJECTOR | Freq: Every day | SUBCUTANEOUS | 3 refills | Status: DC
  Filled 2023-06-24: qty 27, 90d supply, fill #0

## 2023-06-24 NOTE — Progress Notes (Signed)
 Name: Emily Farmer  Age/ Sex: 68 y.o., female   MRN/ DOB: [id number], 27-Oct-1955     PCP: Vicci Barnie NOVAK, MD   Reason for Endocrinology Evaluation: Type 2 Diabetes Mellitus  Initial Endocrine Consultative Visit: 09/26/2019    PATIENT IDENTIFIER: Emily Farmer is a 68 y.o. female with a past medical history of DM, HTN, A.Fib , S/P pacemaker, CAD(S/P CABG 2024)   and dyslipidemia. The patient has followed with Endocrinology clinic since 09/26/2019 for consultative assistance with management of her diabetes.  DIABETIC HISTORY:  Emily Farmer was diagnosed with DM in 2013, has been on insulin  and metformin  since diagnosis. Her hemoglobin A1c has ranged from 9.5% in 2020, peaking at 12.2% in 2019    On initial visit to our clinic, her A1c was 12.4% . She was on Lantus  and metformin  and we added Novolog     Stopped metformin  05/2021 and started Jardiance   Trulicity  caused diarrhea   Lives with sister    She was trained on Dexcom 09/2022  SUBJECTIVE:   During the last visit (08/22/2022): A1c 10.2%   Today (06/24/2023): Emily Farmer is here for a follow up on diabetes management.  She checks her blood sugars 2-3 time a day .SABRA The patient has not had hypoglycemic episodes since the last clinic visit.  She has not been using the dexcom recently because she could not find the charger.  Since her last visit here she was diagnosed with CAD, s/p CABG x 3 02/2023.  As well as s/p ablation 12/2022  She was discharged on smaller doses than previously prescribed of her glycemic agents Denies chest pain or sob recently  Denies LE edema  Denies nausea or vomiting  Denies constipation or diarrhea    HOME DIABETES REGIMEN:  Pioglitazone  15 mg daily  Lantus  55 units daily- Has been taking 10 units  Humalog  12 unitsTIDQAC- 5 units TIDQAC Jardiance  10 mg daily  Victoza  1.8 mg daily daily  Correction scale : HUmalog  (BG -130/30)     Statin: Yes ACE-I/ARB: no  METER DOWNLOAD SUMMARY:Unable  to download 148 -  328 mg/dL    90 day average 775     DIABETIC COMPLICATIONS: Microvascular complications:  Fluctuating GFR, cataract, neuropathy  Last eye exam: Completed 10/22/2022 Macrovascular complications:    Denies: CAD, PVD, CVA      HISTORY:  Past Medical History:  Past Medical History:  Diagnosis Date   Arthritis    Atrial fibrillation (HCC)    a. 09/2016 in setting of pancreatitis;  b. 09/2016 Echo: EF 65-60%, no rwma, Gr2 DD, mild MR, triv TR, PASP ;  CHA2DS2VASc = 4-->Eliquis  5mg  BID. c. Recurred 06/2018 in setting of GIB.   Chronic diastolic CHF (congestive heart failure) (HCC) 01/28/2018   Diabetes mellitus    GI bleeding 06/2018   Gout    Hyperlipidemia    Hypertension    Hypertriglyceridemia    Pancreatitis    a. 09/2016 - Triglycerides 1,392 on admission.   Past Surgical History:  Past Surgical History:  Procedure Laterality Date   AV NODE ABLATION N/A 01/02/2023   Procedure: AV NODE ABLATION;  Surgeon: Waddell Danelle ORN, MD;  Location: [facility] INVASIVE CV LAB;  Service: Cardiovascular;  Laterality: N/A;   BIOPSY  06/20/2018   Procedure: BIOPSY;  Surgeon: Rosalie Kitchens, MD;  Location: [facility] ENDOSCOPY;  Service: Endoscopy;;   CHOLECYSTECTOMY N/A 01/04/2014   Procedure: LAPAROSCOPIC CHOLECYSTECTOMY WITH INTRAOPERATIVE CHOLANGIOGRAM;  Surgeon: Lynda Leos, MD;  Location: [facility] OR;  Service: General;  Laterality: N/A;   COLONOSCOPY WITH PROPOFOL  N/A 06/21/2018   Procedure: COLONOSCOPY WITH PROPOFOL ;  Surgeon: Donnald Charleston, MD;  Location: [facility] ENDOSCOPY;  Service: Endoscopy;  Laterality: N/A;   CORONARY ARTERY BYPASS GRAFT N/A 03/09/2023   Procedure: CORONARY ARTERY BYPASS GRAFTING (CABG) TIMES THREE USING THE LEFT INTERNAL MAMMARY ARTERY (LIMA) AND ENDOSCOPICALLY HARVESTED RIGHT GREATER SAPHENOUS VEIN;  Surgeon: Lucas Dorise POUR, MD;  Location: [facility] OR;  Service: Open Heart Surgery;  Laterality: N/A;   ESOPHAGOGASTRODUODENOSCOPY (EGD) WITH PROPOFOL  N/A 06/20/2018    Procedure: ESOPHAGOGASTRODUODENOSCOPY (EGD) WITH PROPOFOL ;  Surgeon: Rosalie Kitchens, MD;  Location: [facility] ENDOSCOPY;  Service: Endoscopy;  Laterality: N/A;   IR THORACENTESIS ASP PLEURAL SPACE W/IMG GUIDE  03/16/2023   LUNG SURGERY     PACEMAKER IMPLANT N/A 03/03/2022   Procedure: PACEMAKER IMPLANT;  Surgeon: Waddell Danelle ORN, MD;  Location: [facility];  Service: Cardiovascular;  Laterality: N/A;   RIGHT HEART CATH N/A 03/06/2023   Procedure: RIGHT HEART CATH;  Surgeon: Rolan Ezra RAMAN, MD;  Location: [facility] INVASIVE CV LAB;  Service: Cardiovascular;  Laterality: N/A;   RIGHT/LEFT HEART CATH AND CORONARY ANGIOGRAPHY N/A 03/03/2023   Procedure: RIGHT/LEFT HEART CATH AND CORONARY ANGIOGRAPHY;  Surgeon: Anner Alm ORN, MD;  Location: [facility] INVASIVE CV LAB;  Service: Cardiovascular;  Laterality: N/A;   TEE WITHOUT CARDIOVERSION N/A 03/09/2023   Procedure: TRANSESOPHAGEAL ECHOCARDIOGRAM;  Surgeon: Lucas Dorise POUR, MD;  Location: [facility] OR;  Service: Open Heart Surgery;  Laterality: N/A;   Social History:  reports that she has never smoked. She has never used smokeless tobacco. She reports that she does not drink alcohol  and does not use drugs. Family History:  Family History  Problem Relation Age of Onset   Heart attack Mother    Heart attack Father    Diabetes Sister    High blood pressure Neg Hx    High Cholesterol Neg Hx      HOME MEDICATIONS: Allergies as of 06/24/2023       Reactions   Trulicity  [dulaglutide ] Diarrhea        Medication List        Accurate as of June 24, 2023  7:57 AM. If you have any questions, ask your nurse or doctor.          Accu-Chek Guide Me w/Device Kit Use to check blood sugar 3 times daily.   Accu-Chek Guide Test test strip Generic drug: glucose blood Use as instructed   Accu-Chek Softclix Lancets lancets Use to check blood sugar 3 times daily.   acetaminophen  325 MG tablet Commonly known as: TYLENOL  Take 2 tablets (650 mg total) by mouth every 6  (six) hours as needed for mild pain (or Fever >/= 101).   aspirin  EC 81 MG tablet Take 1 tablet (81 mg total) by mouth daily. Swallow whole.   atorvastatin  40 MG tablet Commonly known as: LIPITOR Take 1 tablet (40 mg total) by mouth daily.   Blood Pressure Monitor/Arm Devi Check blood pressure once per day at least 2 hours after medications.   CENTRUM SILVER  PO Take 1 tablet by mouth daily.   Ocuvite Adult 50+ Caps Take 1 capsule by mouth daily.   Dexcom G7 Sensor Misc 1 Device by Does not apply route as directed.   diclofenac  Sodium 1 % Gel Commonly known as: Voltaren  Apply 2 g topically 4 (four) times daily. Apply across lower back for back pain What changed:  when to take this reasons to take this  Droplet Pen Needles 32G X 4 MM Misc Generic drug: Insulin  Pen Needle use in the morning, at noon, in the evening, and at bedtime.   Eliquis  5 MG Tabs tablet Generic drug: apixaban  Take 1 tablet (5 mg total) by mouth in the morning and at bedtime.   fenofibrate  48 MG tablet Commonly known as: Tricor  Take 1 tablet (48 mg total) by mouth daily.   FeroSul 325 (65 Fe) MG tablet Generic drug: ferrous sulfate  Take 1 tablet (325 mg total) by mouth daily with breakfast.   HumaLOG  KwikPen 100 UNIT/ML KwikPen Generic drug: insulin  lispro Max daily 45 units What changed:  how much to take how to take this when to take this additional instructions   Insulin  Syringe-Needle U-100 31G X 5/16 0.3 ML Misc Commonly known as: TRUEplus Insulin  Syringe Use to inject insulin  daily. What changed:  how much to take how to take this when to take this   Jardiance  10 MG Tabs tablet Generic drug: empagliflozin  Take 1 tablet (10 mg total) by mouth daily.   Lantus  SoloStar 100 UNIT/ML Solostar Pen Generic drug: insulin  glargine Inject 55 Units into the skin daily. What changed: how much to take   lidocaine  5 % Commonly known as: Lidoderm  Place 1 patch onto the skin daily.  Remove & Discard patch within 12 hours or as directed by MD   pioglitazone  15 MG tablet Commonly known as: Actos  Take 1 tablet (15 mg total) by mouth daily.   torsemide  20 MG tablet Commonly known as: DEMADEX  Take 1 tablet (20 mg) once a day, Take extra dose of 20 mg as needed for sudden weight gain and leg swelling What changed:  how much to take how to take this when to take this reasons to take this additional instructions   Victoza  18 MG/3ML Sopn Generic drug: liraglutide  Inject 1.8 mg into the skin daily.         OBJECTIVE:   Vital Signs: BP 124/76 (BP Location: Left Arm, Patient Position: Sitting, Cuff Size: Normal)   Pulse 94   SpO2 96%   Wt Readings from Last 3 Encounters:  06/04/23 167 lb 3.2 oz (75.8 kg)  05/18/23 167 lb 1.6 oz (75.8 kg)  05/13/23 163 lb (73.9 kg)     Exam: General: Pt appears well and is in NAD  Lungs: Clear with good BS bilat   Heart: RRR  Extremities: No pretibial edema.   Neuro: MS is good with appropriate affect, pt is alert and Ox3   DM foot exam: 06/24/2023   The skin of the feet is intact without sores or ulcerations,with thickened yellow toe nail The pedal pulses are undetectable  The sensation is decreased  to a screening 5.07, 10 gram monofilament bilaterally  DATA REVIEWED:  Lab Results  Component Value Date   HGBA1C 9.1 (A) 06/24/2023   HGBA1C 8.0 (H) 03/04/2023   HGBA1C 7.8 (A) 01/13/2023    Latest Reference Range & Units 05/13/23 21:10  Sodium 135 - 145 mmol/L 138  Potassium 3.5 - 5.1 mmol/L 3.9  Chloride 98 - 111 mmol/L 106  CO2 22 - 32 mmol/L 19 (L)  Glucose 70 - 99 mg/dL 893 (H)  BUN 8 - 23 mg/dL 57 (H)  Creatinine 9.55 - 1.00 mg/dL 8.51 (H)  Calcium  8.9 - 10.3 mg/dL 9.2  Anion gap 5 - 15  13  Alkaline Phosphatase 38 - 126 U/L 44  Albumin  3.5 - 5.0 g/dL 3.6  Lipase 11 - 51 U/L 83 (H)  AST 15 - 41 U/L 23  ALT 0 - 44 U/L 14  Total Protein 6.5 - 8.1 g/dL 7.0  Total Bilirubin <8.7 mg/dL 0.4  GFR,  Estimated >39 mL/min 39 (L)     ASSESSMENT / PLAN / RECOMMENDATIONS:   1) Type 2 Diabetes Mellitus, Poorly controlled, With neuropathic and macrovascular complications - Most recent A1c of 9.1 %   A1c remains above goal -She is intolerant to Trulicity   -I will discontinue pioglitazone  due to lower extremity edema -I have recommended bionic i let pump, patient will need fasting C-peptide and serum glucose -Since her discharge from rehab she has been sent out on smaller doses than in the past, will increase medications as below   MEDICATIONS: -Stop pioglitazone  15 mg daily -Increase Jardiace 25 mg daily  -Continue Victoza  1.8 mg daily  -Increase Lantus  20 units daily  -Increase Humalog  8 units with EACH meal  -Correction scale : HUmalog  (BG -130/30)      EDUCATION / INSTRUCTIONS: BG monitoring instructions: Patient is instructed to check her blood sugars 3 times a day, before meals. I reviewed the Rule of 15 for the treatment of hypoglycemia in detail with the patient. Literature supplied.    2) Diabetic complications:  Eye: Does not have known diabetic retinopathy.  Neuro/ Feet: Does have known diabetic peripheral neuropathy. Renal: Patient does have known baseline CKD. She is not on an ACEI/ARB at present.      F/U in 3 months   Signed electronically by: Stefano Redgie Butts, MD  [facility] Endocrinology  [facility] [address] [place] [place], [place] [address] Phone: [phone] FAX: [phone]   CC: Vicci Barnie NOVAK, MD [address] [place] [address] Phone: [phone]  Fax: [phone]  Return to Endocrinology clinic as below: Future Appointments  Date Time Provider Department Center  06/25/2023  1:15 PM Maranda Lister D [facility] None  07/08/2023  5:00 PM [facility] MM 2 [facility]  07/09/2023 10:30 AM Duke, Jon Garre, PA [facility] None  08/06/2023  1:30 PM Vicci Barnie NOVAK, MD [facility] None

## 2023-06-24 NOTE — Patient Instructions (Addendum)
-   STOP Pioglitazone  ( actos  ) 15 mg daily  - Increase  Jardiance  25 mg daily  - Increase  Lantus  20 units daily  - Increase  Humalog  8 units with EACH meal - Continue Victoza  1.8 mg daily   - Humalog  correctional insulin : ADD extra units on insulin  to your meal-time Humalog  dose if your blood sugars are higher than 160. Use the scale below to help guide you:   Blood sugar before meal Number of units to inject  Less than 160 0 unit  161 -  190 1 units  191 -  220 2 units  221 -  250 3 units  251 -  280 4 units  281 -  310 5 units  311 -  340 6 units  341 -  370 7 units  371 -  400 8 units       HOW TO TREAT LOW BLOOD SUGARS (Blood sugar LESS THAN 70 MG/DL) Please follow the RULE OF 15 for the treatment of hypoglycemia treatment (when your (blood sugars are less than 70 mg/dL)   STEP 1: Take 15 grams of carbohydrates when your blood sugar is low, which includes:  3-4 GLUCOSE TABS  OR 3-4 OZ OF JUICE OR REGULAR SODA OR ONE TUBE OF GLUCOSE GEL    STEP 2: RECHECK blood sugar in 15 MINUTES STEP 3: If your blood sugar is still low at the 15 minute recheck --> then, go back to STEP 1 and treat AGAIN with another 15 grams of carbohydrates.

## 2023-06-25 ENCOUNTER — Ambulatory Visit: Payer: Self-pay | Admitting: Licensed Clinical Social Worker

## 2023-06-25 DIAGNOSIS — H02836 Dermatochalasis of left eye, unspecified eyelid: Secondary | ICD-10-CM | POA: Diagnosis not present

## 2023-06-25 DIAGNOSIS — E113512 Type 2 diabetes mellitus with proliferative diabetic retinopathy with macular edema, left eye: Secondary | ICD-10-CM | POA: Diagnosis not present

## 2023-06-25 DIAGNOSIS — H35033 Hypertensive retinopathy, bilateral: Secondary | ICD-10-CM | POA: Diagnosis not present

## 2023-06-25 DIAGNOSIS — H02833 Dermatochalasis of right eye, unspecified eyelid: Secondary | ICD-10-CM | POA: Diagnosis not present

## 2023-06-25 NOTE — Patient Outreach (Signed)
  Care Coordination   Follow Up Visit Note   06/25/2023 Name: Emily Farmer MRN: [id number] DOB: [birth year]  Emily Farmer is a 68 y.o. year old female who sees Emily Barnie NOVAK, MD for primary care. I spoke with  Emily Farmer by phone today.  What matters to the patients health and wellness today?  Resources mailed and received    Goals Addressed             This Visit's Progress    COMPLETED: Care Coordination Activities       Care Coordination Interventions: Patient stated that she did not receive the resources and he Sw stated that she will send the request for them to be mailed out again.   Sw went over the SDOH Questions and no other needs at this time SW will follow up in 2 weeks on 06/25/2023 at 1:15 pm        SDOH assessments and interventions completed:  Yes  SDOH Interventions Today    Flowsheet Row Most Recent Value  SDOH Interventions   Food Insecurity Interventions Intervention Not Indicated  Housing Interventions Intervention Not Indicated  Transportation Interventions Intervention Not Indicated        Care Coordination Interventions:  Yes, provided  Interventions Today    Flowsheet Row Most Recent Value  General Interventions   General Interventions Discussed/Reviewed General Interventions Reviewed, Emily Farmer received the resources that were mailed and stated that she can handle it from here and will not need another follow up call from the SW.]        Follow up plan: No further intervention required.   Encounter Outcome:  Patient Visit Completed   Tobias CHARM Maranda HEDWIG, PhD [facility], [facility] Social Worker Direct Dial : [phone]  Fax: [phone]

## 2023-06-25 NOTE — Patient Instructions (Signed)
 Visit Information  Thank you for taking time to visit with me today. Please don't hesitate to contact me if I can be of assistance to you.   Following are the goals we discussed today:   Goals Addressed             This Visit's Progress    COMPLETED: Care Coordination Activities       Care Coordination Interventions: Patient stated that she did not receive the resources and he Sw stated that she will send the request for them to be mailed out again.   Sw went over the SDOH Questions and no other needs at this time SW will follow up in 2 weeks on 06/25/2023 at 1:15 pm        Our No further follow up call needed, SW encouraged the patient to contact the PCP to be added back to the SW's schedule if needed in the future.  Please call the care guide team at [phone] if you need to cancel or reschedule your appointment.   If you are experiencing a Mental Health or Behavioral Health Crisis or need someone to talk to, please call the Suicide and Crisis Lifeline: 988 go to [facility] [address], [place] [id number]) call 911  The patient verbalized understanding of instructions, educational materials, and care plan provided today and DECLINED offer to receive copy of patient instructions, educational materials, and care plan.   Tobias CHARM Maranda HEDWIG, PhD [facility], [facility] Social Worker Direct Dial : [phone]  Fax: [phone]

## 2023-06-26 ENCOUNTER — Other Ambulatory Visit: Payer: Self-pay

## 2023-06-26 ENCOUNTER — Other Ambulatory Visit (HOSPITAL_COMMUNITY): Payer: Self-pay

## 2023-06-26 ENCOUNTER — Other Ambulatory Visit: Payer: Self-pay | Admitting: Internal Medicine

## 2023-06-26 ENCOUNTER — Ambulatory Visit: Payer: Medicare HMO

## 2023-06-28 MED ORDER — ATORVASTATIN CALCIUM 40 MG PO TABS
40.0000 mg | ORAL_TABLET | Freq: Every day | ORAL | 3 refills | Status: DC
  Filled 2023-06-28: qty 90, 90d supply, fill #0

## 2023-06-29 ENCOUNTER — Other Ambulatory Visit (HOSPITAL_COMMUNITY): Payer: Self-pay

## 2023-06-29 ENCOUNTER — Other Ambulatory Visit: Payer: Self-pay

## 2023-06-30 ENCOUNTER — Other Ambulatory Visit: Payer: Medicare HMO

## 2023-06-30 ENCOUNTER — Other Ambulatory Visit: Payer: Self-pay

## 2023-07-01 ENCOUNTER — Telehealth: Payer: Self-pay | Admitting: Internal Medicine

## 2023-07-01 NOTE — Telephone Encounter (Signed)
 Emily Farmer from [facility] called in about fax she says sent on Jan 13, for 14 refills that include and Atorvastin, Eliquis , Has this been received. Please cb to inform if so. I only see Atorvastatin 

## 2023-07-01 NOTE — Telephone Encounter (Signed)
 It looks like this patient prefers to fill with [facility] and not [facility].

## 2023-07-02 ENCOUNTER — Other Ambulatory Visit: Payer: Self-pay

## 2023-07-02 ENCOUNTER — Other Ambulatory Visit: Payer: Medicare HMO

## 2023-07-02 DIAGNOSIS — Z794 Long term (current) use of insulin: Secondary | ICD-10-CM | POA: Diagnosis not present

## 2023-07-02 DIAGNOSIS — E1159 Type 2 diabetes mellitus with other circulatory complications: Secondary | ICD-10-CM | POA: Diagnosis not present

## 2023-07-02 DIAGNOSIS — I2510 Atherosclerotic heart disease of native coronary artery without angina pectoris: Secondary | ICD-10-CM

## 2023-07-02 DIAGNOSIS — I4819 Other persistent atrial fibrillation: Secondary | ICD-10-CM

## 2023-07-02 NOTE — Telephone Encounter (Signed)
[facility] wanting the following meds sent to them for dispensing: Acucheck Lancets Eliquis G7 sensor Jardiance Fenofibrate Lantus Humalog Pen needles Liraglutide torsemide

## 2023-07-02 NOTE — Telephone Encounter (Signed)
[facility] called wanting meds sent to them but precious charting indicates [facility].

## 2023-07-02 NOTE — Telephone Encounter (Signed)
Just got a call from [place] from [facility] all prescription to go to them: Specifically Accucheck Lancets, Eliquis, Atorvastatin, G7 Sensor,  Jardiance, fenofibrate, Lantus, Humalog, Pen needles, liraglutide, torsemide.

## 2023-07-03 LAB — BASIC METABOLIC PANEL
BUN/Creatinine Ratio: 49 (calc) — ABNORMAL HIGH (ref 6–22)
BUN: 63 mg/dL — ABNORMAL HIGH (ref 7–25)
CO2: 27 mmol/L (ref 20–32)
Calcium: 9.7 mg/dL (ref 8.6–10.4)
Chloride: 97 mmol/L — ABNORMAL LOW (ref 98–110)
Creat: 1.29 mg/dL — ABNORMAL HIGH (ref 0.50–1.05)
Glucose, Bld: 161 mg/dL — ABNORMAL HIGH (ref 65–99)
Potassium: 3.7 mmol/L (ref 3.5–5.3)
Sodium: 139 mmol/L (ref 135–146)

## 2023-07-03 LAB — C-PEPTIDE: C-Peptide: 1.86 ng/mL (ref 0.80–3.85)

## 2023-07-03 NOTE — Progress Notes (Deleted)
Cardiology Office Note:    Date:  07/03/2023   ID:  Emily Farmer, DOB 12-30-1955, MRN [id number]  PCP:  Marcine Matar, MD   [facility] Providers Cardiologist:  Chrystie Nose, MD Electrophysiologist:  Lanier Prude, MD { Click to update primary MD,subspecialty MD or APP then REFRESH:1}    Referring MD: Marcine Matar, MD   No chief complaint on file. ***  History of Present Illness:    Emily Farmer is a 68 y.o. female with a hx of CAD s/p CABG x 3 with SVG-LAD, SVG-OM, SVG-PDA, on 03/09/2023, permanent A-fib s/p AV node ablation and single-chamber PPM 12/2022, DM2 on insulin, hyperlipidemia.  Approximately 2 months after ablation and PPM placement, she presented with chest heaviness and shortness of breath on exertion.  Echocardiogram 03/02/2023 showed an EF 60 to 65%, no WMA, mildly reduced RV, severely elevated PASP, mild MR.  She underwent right left heart catheterization 03/03/2023 that showed multivessel CAD and severe pulmonary hypertension.  She was referred to advanced heart failure clinic who titrated diuresis SGLT2 inhibitor and recommended CABG.  She underwent CABG times 39/23/24. LIMA was small with slow blood flow and felt not suitable as a bypass conduit.  Postop course has been complicated by preoperative deconditioning.  She required midodrine wean to 5 mg twice daily prior to discharge.  She restarted her home 100 mg Toprol and 120 mg Cardizem, midodrine was discontinued at CT surgery follow-up.  She has had subsequent ER visits for chest discomfort, last ER visit was the same day she saw CT surgery in the office -05/13/2023.  I saw her for routine cardiology follow-up 06/04/2023.  She is in an area in which her pacemaker transmitter does not work due to cell tower coverage.  Confirmed with device clinic.  She was very confused about her diuretic regimen when I saw her with an AKI.  She was possibly taking furosemide and torsemide simultaneously.  I  discontinued furosemide and continue 20 mg torsemide as needed.  She presents        CAD s/p CABG x 3 02/2023 - Remains on 81 mg aspirin, 40 mg Lipitor -- no chest pain   Permanent atrial fibrillation Chronic anticoagulation PPM in place - Remains on 5 mg Eliquis twice daily,  -- no longer on 100 mg Toprol or 120 mg Cardizem -- no interrogation - has to be in-clinic -- defer to EP   Hyperlipidemia with LDL goal less than 55 - Given use of SVG to LAD and insulin-dependent diabetes, would recommend a lower LDL goal -Has been maintained on 40 mg Lipitor, 40 mg fenofibrate 03/02/2023: Cholesterol 95; HDL 39; LDL Cholesterol 32; Triglycerides 121; VLDL 24 Consider drawing and LPA with next labs   Pulmonary hypertension Chronic diastolic heart failure - 20 mg torsemide PRN - need to clarify diuretic regimen         Past Medical History:  Diagnosis Date   Arthritis    Atrial fibrillation (HCC)    a. 09/2016 in setting of pancreatitis;  b. 09/2016 Echo: EF 65-60%, no rwma, Gr2 DD, mild MR, triv TR, PASP ;  CHA2DS2VASc = 4-->Eliquis 5mg  BID. c. Recurred 06/2018 in setting of GIB.   Chronic diastolic CHF (congestive heart failure) (HCC) 01/28/2018   Diabetes mellitus    GI bleeding 06/2018   Gout    Hyperlipidemia    Hypertension    Hypertriglyceridemia    Pancreatitis    a. 09/2016 - Triglycerides 1,392 on admission.  Past Surgical History:  Procedure Laterality Date   AV NODE ABLATION N/A 01/02/2023   Procedure: AV NODE ABLATION;  Surgeon: Marinus Maw, MD;  Location: [facility] INVASIVE CV LAB;  Service: Cardiovascular;  Laterality: N/A;   BIOPSY  06/20/2018   Procedure: BIOPSY;  Surgeon: Vida Rigger, MD;  Location: [facility] ENDOSCOPY;  Service: Endoscopy;;   CHOLECYSTECTOMY N/A 01/04/2014   Procedure: LAPAROSCOPIC CHOLECYSTECTOMY WITH INTRAOPERATIVE CHOLANGIOGRAM;  Surgeon: Axel Filler, MD;  Location: [facility] OR;  Service: General;  Laterality: N/A;   COLONOSCOPY WITH  PROPOFOL N/A 06/21/2018   Procedure: COLONOSCOPY WITH PROPOFOL;  Surgeon: Bernette Redbird, MD;  Location: [facility] ENDOSCOPY;  Service: Endoscopy;  Laterality: N/A;   CORONARY ARTERY BYPASS GRAFT N/A 03/09/2023   Procedure: CORONARY ARTERY BYPASS GRAFTING (CABG) TIMES THREE USING THE LEFT INTERNAL MAMMARY ARTERY (LIMA) AND ENDOSCOPICALLY HARVESTED RIGHT GREATER SAPHENOUS VEIN;  Surgeon: [name], MD;  Location: [facility] OR;  Service: Open Heart Surgery;  Laterality: N/A;   ESOPHAGOGASTRODUODENOSCOPY (EGD) WITH PROPOFOL N/A 06/20/2018   Procedure: ESOPHAGOGASTRODUODENOSCOPY (EGD) WITH PROPOFOL;  Surgeon: Vida Rigger, MD;  Location: [facility] ENDOSCOPY;  Service: Endoscopy;  Laterality: N/A;   IR THORACENTESIS ASP PLEURAL SPACE W/IMG GUIDE  03/16/2023   LUNG SURGERY     PACEMAKER IMPLANT N/A 03/03/2022   Procedure: PACEMAKER IMPLANT;  Surgeon: Marinus Maw, MD;  Location: [facility] INVASIVE CV LAB;  Service: Cardiovascular;  Laterality: N/A;   RIGHT HEART CATH N/A 03/06/2023   Procedure: RIGHT HEART CATH;  Surgeon: Laurey Morale, MD;  Location: [facility] INVASIVE CV LAB;  Service: Cardiovascular;  Laterality: N/A;   RIGHT/LEFT HEART CATH AND CORONARY ANGIOGRAPHY N/A 03/03/2023   Procedure: RIGHT/LEFT HEART CATH AND CORONARY ANGIOGRAPHY;  Surgeon: Marykay Lex, MD;  Location: [facility] INVASIVE CV LAB;  Service: Cardiovascular;  Laterality: N/A;   TEE WITHOUT CARDIOVERSION N/A 03/09/2023   Procedure: TRANSESOPHAGEAL ECHOCARDIOGRAM;  Surgeon: [name], MD;  Location: [facility];  Service: Open Heart Surgery;  Laterality: N/A;    Current Medications: No outpatient medications have been marked as taking for the 07/09/23 encounter (Appointment) with Marcelino Duster, PA.     Allergies:   Trulicity [dulaglutide]   Social History   Socioeconomic History   Marital status: Single    Spouse name: Not on file   Number of children: Not on file   Years of education: Not on file   Highest education level: Not on file   Occupational History   Not on file  Tobacco Use   Smoking status: Never   Smokeless tobacco: Never  Vaping Use   Vaping status: Never Used  Substance and Sexual Activity   Alcohol use: No   Drug use: No   Sexual activity: Not Currently  Other Topics Concern   Not on file  Social History Narrative   Lives with her sister and does not need any assistance with ADLs.     Social Drivers of Health   Financial Resource Strain: Medium Risk (05/18/2023)   Overall Financial Resource Strain (CARDIA)    Difficulty of Paying Living Expenses: Somewhat hard  Food Insecurity: No Food Insecurity (06/25/2023)   Hunger Vital Sign    Worried About Running Out of Food in the Last Year: Never true    Ran Out of Food in the Last Year: Never true  Recent Concern: Food Insecurity - Food Insecurity Present (06/05/2023)   Hunger Vital Sign    Worried About Running Out of Food in the Last Year: Sometimes true  Ran Out of Food in the Last Year: Sometimes true  Transportation Needs: No Transportation Needs (06/25/2023)   PRAPARE - Administrator, Civil Service (Medical): No    Lack of Transportation (Non-Medical): No  Recent Concern: Transportation Needs - Unmet Transportation Needs (05/06/2023)   PRAPARE - Transportation    Lack of Transportation (Medical): Yes    Lack of Transportation (Non-Medical): Yes  Physical Activity: Insufficiently Active (05/06/2023)   Exercise Vital Sign    Days of Exercise per Week: 7 days    Minutes of Exercise per Session: 20 min  Stress: No Stress Concern Present (05/18/2023)   [employer] of Occupational Health - Occupational Stress Questionnaire    Feeling of Stress : Not at all  Recent Concern: Stress - Stress Concern Present (05/06/2023)   [employer] of Occupational Health - Occupational Stress Questionnaire    Feeling of Stress : Very much  Social Connections: Socially Isolated (05/18/2023)   Social Connection and Isolation Panel [NHANES]     Frequency of Communication with Friends and Family: Once a week    Frequency of Social Gatherings with Friends and Family: Once a week    Attends Religious Services: Never    Database administrator or Organizations: No    Attends Engineer, structural: Never    Marital Status: Never married     Family History: The patient's ***family history includes Diabetes in her sister; Heart attack in her father and mother. There is no history of High blood pressure or High Cholesterol.  ROS:   Please see the history of present illness.    *** All other systems reviewed and are negative.  EKGs/Labs/Other Studies Reviewed:    The following studies were reviewed today: ***      Recent Labs: 03/10/2023: Magnesium 2.5 04/13/2023: B Natriuretic Peptide 223.5 05/13/2023: ALT 14; Hemoglobin 11.6; Platelets 196 07/02/2023: BUN 63; Creat 1.29; Potassium 3.7; Sodium 139  Recent Lipid Panel    Component Value Date/Time   CHOL 95 03/02/2023 0422   CHOL 351 (H) 11/18/2022 1216   TRIG 121 03/02/2023 0422   HDL 39 (L) 03/02/2023 0422   HDL 31 (L) 11/18/2022 1216   CHOLHDL 2.4 03/02/2023 0422   VLDL 24 03/02/2023 0422   LDLCALC 32 03/02/2023 0422   LDLCALC Comment (A) 11/18/2022 1216   LDLDIRECT 137 (H) 03/01/2020 1012   LDLDIRECT 96.0 09/23/2019 0828     Risk Assessment/Calculations:   {Does this patient have ATRIAL FIBRILLATION?:[id number]}  No BP recorded.  {Refresh Note OR Click here to enter BP  :1}***         Physical Exam:    VS:  There were no vitals taken for this visit.    Wt Readings from Last 3 Encounters:  06/04/23 167 lb 3.2 oz (75.8 kg)  05/18/23 167 lb 1.6 oz (75.8 kg)  05/13/23 163 lb (73.9 kg)     GEN: *** Well nourished, well developed in no acute distress HEENT: Normal NECK: No JVD; No carotid bruits LYMPHATICS: No lymphadenopathy CARDIAC: ***RRR, no murmurs, rubs, gallops RESPIRATORY:  Clear to auscultation without rales, wheezing or rhonchi  ABDOMEN:  Soft, non-tender, non-distended MUSCULOSKELETAL:  No edema; No deformity  SKIN: Warm and dry NEUROLOGIC:  Alert and oriented x 3 PSYCHIATRIC:  Normal affect   ASSESSMENT:    No diagnosis found. PLAN:    In order of problems listed above:  ***      {Are you ordering a CV Procedure (e.g. stress test,  cath, DCCV, TEE, etc)?   Press F2        :[id number]}    Medication Adjustments/Labs and Tests Ordered: Current medicines are reviewed at length with the patient today.  Concerns regarding medicines are outlined above.  No orders of the defined types were placed in this encounter.  No orders of the defined types were placed in this encounter.   There are no Patient Instructions on file for this visit.   Signed, Marcelino Duster, [place]  07/03/2023 4:24 PM    [facility]

## 2023-07-03 NOTE — Telephone Encounter (Signed)
Call placed to patient to verify what pharmacy she prefer her medication sent to unable to reach or left message.

## 2023-07-06 ENCOUNTER — Telehealth: Payer: Self-pay | Admitting: Internal Medicine

## 2023-07-06 ENCOUNTER — Other Ambulatory Visit: Payer: Self-pay

## 2023-07-06 NOTE — Telephone Encounter (Signed)
Please proceed with bionic pump orders, C-peptide has been resulted    Thanks

## 2023-07-07 DIAGNOSIS — I5032 Chronic diastolic (congestive) heart failure: Secondary | ICD-10-CM | POA: Diagnosis not present

## 2023-07-07 DIAGNOSIS — D631 Anemia in chronic kidney disease: Secondary | ICD-10-CM | POA: Diagnosis not present

## 2023-07-07 DIAGNOSIS — I051 Rheumatic mitral insufficiency: Secondary | ICD-10-CM | POA: Diagnosis not present

## 2023-07-07 DIAGNOSIS — I130 Hypertensive heart and chronic kidney disease with heart failure and stage 1 through stage 4 chronic kidney disease, or unspecified chronic kidney disease: Secondary | ICD-10-CM | POA: Diagnosis not present

## 2023-07-07 DIAGNOSIS — I2510 Atherosclerotic heart disease of native coronary artery without angina pectoris: Secondary | ICD-10-CM | POA: Diagnosis not present

## 2023-07-07 DIAGNOSIS — I480 Paroxysmal atrial fibrillation: Secondary | ICD-10-CM | POA: Diagnosis not present

## 2023-07-07 DIAGNOSIS — N1832 Chronic kidney disease, stage 3b: Secondary | ICD-10-CM | POA: Diagnosis not present

## 2023-07-07 DIAGNOSIS — I495 Sick sinus syndrome: Secondary | ICD-10-CM | POA: Diagnosis not present

## 2023-07-07 DIAGNOSIS — E1122 Type 2 diabetes mellitus with diabetic chronic kidney disease: Secondary | ICD-10-CM | POA: Diagnosis not present

## 2023-07-08 ENCOUNTER — Other Ambulatory Visit: Payer: Self-pay

## 2023-07-08 ENCOUNTER — Telehealth: Payer: Self-pay

## 2023-07-08 ENCOUNTER — Other Ambulatory Visit: Payer: Self-pay | Admitting: Internal Medicine

## 2023-07-08 ENCOUNTER — Ambulatory Visit: Payer: Medicare HMO

## 2023-07-08 DIAGNOSIS — Z794 Long term (current) use of insulin: Secondary | ICD-10-CM

## 2023-07-08 DIAGNOSIS — E1165 Type 2 diabetes mellitus with hyperglycemia: Secondary | ICD-10-CM | POA: Diagnosis not present

## 2023-07-08 MED ORDER — LANTUS SOLOSTAR 100 UNIT/ML ~~LOC~~ SOPN: 20.0000 [IU] | PEN_INJECTOR | Freq: Every day | SUBCUTANEOUS | 3 refills | Status: DC

## 2023-07-08 MED ORDER — LIRAGLUTIDE 18 MG/3ML ~~LOC~~ SOPN: 1.8000 mg | PEN_INJECTOR | Freq: Every day | SUBCUTANEOUS | 3 refills | Status: DC

## 2023-07-08 NOTE — Addendum Note (Signed)
Addended by: Lois Huxley, Jeannett Senior L on: 07/08/2023 04:02 PM   Modules accepted: Orders

## 2023-07-08 NOTE — Telephone Encounter (Signed)
Copied from CRM [id number]. Topic: General - Other >> Jul 07, 2023  5:46 PM Everette C wrote: Reason for CRM: Michala with [facility] has called to follow up on previous refill requests of Rx #: [id number] liraglutide (VICTOZA) 18 MG/3ML SOPN [[id number]] and Rx #: [id number] Insulin Glargine (BASAGLAR KWIKPEN) 100 UNIT/ML [[id number]]   Please contact [phone] when possible to follow up on the status of the requests

## 2023-07-08 NOTE — Telephone Encounter (Signed)
Copied from CRM [id number]. Topic: General - Other >> Jul 07, 2023  1:15 PM Carla L wrote: Reason for CRM: Pt calling to report her weight to Dr. Laural Benes, 170.6lbs, pt has had an 4lb weight gain.

## 2023-07-08 NOTE — Telephone Encounter (Signed)
I tried to return call to patient today with the phone number listed on the chart.  I got a recording stating call could not be completed as dialed. If patient calls back, let her know that she can discontinue calling in her weight to me and follow the instructions from her cardiologist in regards to when to take an extra fluid pill.

## 2023-07-08 NOTE — Telephone Encounter (Signed)
Rxn sent for Victoza by Dr. Laural Benes already. Rxn for glargine insulin sent.

## 2023-07-09 ENCOUNTER — Ambulatory Visit: Payer: Medicare HMO | Admitting: Physician Assistant

## 2023-07-09 DIAGNOSIS — E1122 Type 2 diabetes mellitus with diabetic chronic kidney disease: Secondary | ICD-10-CM | POA: Diagnosis not present

## 2023-07-09 DIAGNOSIS — D631 Anemia in chronic kidney disease: Secondary | ICD-10-CM | POA: Diagnosis not present

## 2023-07-09 DIAGNOSIS — I495 Sick sinus syndrome: Secondary | ICD-10-CM | POA: Diagnosis not present

## 2023-07-09 DIAGNOSIS — I130 Hypertensive heart and chronic kidney disease with heart failure and stage 1 through stage 4 chronic kidney disease, or unspecified chronic kidney disease: Secondary | ICD-10-CM | POA: Diagnosis not present

## 2023-07-09 DIAGNOSIS — I2510 Atherosclerotic heart disease of native coronary artery without angina pectoris: Secondary | ICD-10-CM | POA: Diagnosis not present

## 2023-07-09 DIAGNOSIS — I5032 Chronic diastolic (congestive) heart failure: Secondary | ICD-10-CM | POA: Diagnosis not present

## 2023-07-09 DIAGNOSIS — I480 Paroxysmal atrial fibrillation: Secondary | ICD-10-CM | POA: Diagnosis not present

## 2023-07-09 DIAGNOSIS — N1832 Chronic kidney disease, stage 3b: Secondary | ICD-10-CM | POA: Diagnosis not present

## 2023-07-09 DIAGNOSIS — I051 Rheumatic mitral insufficiency: Secondary | ICD-10-CM | POA: Diagnosis not present

## 2023-07-09 NOTE — Telephone Encounter (Signed)
Noted! Thank you

## 2023-07-09 NOTE — Telephone Encounter (Signed)
Order has been completed on [employer]

## 2023-07-09 NOTE — Telephone Encounter (Signed)
Call to patient unable to reach . Recording states that unable to complete call. Please when patient return call can we ensure that the correct number is in the chart.

## 2023-07-10 ENCOUNTER — Telehealth: Payer: Self-pay

## 2023-07-10 ENCOUNTER — Other Ambulatory Visit: Payer: Self-pay

## 2023-07-10 ENCOUNTER — Telehealth: Payer: Self-pay | Admitting: Internal Medicine

## 2023-07-10 NOTE — Telephone Encounter (Signed)
Submitted application for [employer] to [employer]) for patient assistance.   Phone: [phone]

## 2023-07-10 NOTE — Telephone Encounter (Signed)
Paperwork received an placed in providers basket

## 2023-07-10 NOTE — Telephone Encounter (Signed)
Copied from CRM [id number]. Topic: General - Other >> Jul 09, 2023  3:17 PM Payton Doughty wrote: Reason for CRM: tiffany w/ [facility] is re faxing order for OT that was originally sent 1/09. If you do not receive. Pls call [phone]

## 2023-07-10 NOTE — Telephone Encounter (Signed)
Unable to reach. No voicemail.  Recording states call cannot be completed as dialed. Pls consult your operator. This is a recording.

## 2023-07-13 DIAGNOSIS — H35033 Hypertensive retinopathy, bilateral: Secondary | ICD-10-CM | POA: Diagnosis not present

## 2023-07-13 DIAGNOSIS — E113511 Type 2 diabetes mellitus with proliferative diabetic retinopathy with macular edema, right eye: Secondary | ICD-10-CM | POA: Diagnosis not present

## 2023-07-13 DIAGNOSIS — H02836 Dermatochalasis of left eye, unspecified eyelid: Secondary | ICD-10-CM | POA: Diagnosis not present

## 2023-07-13 DIAGNOSIS — H02833 Dermatochalasis of right eye, unspecified eyelid: Secondary | ICD-10-CM | POA: Diagnosis not present

## 2023-07-14 ENCOUNTER — Other Ambulatory Visit: Payer: Self-pay

## 2023-07-14 ENCOUNTER — Telehealth: Payer: Self-pay

## 2023-07-14 MED ORDER — OMNIPOD 5 DEXG7G6 PODS GEN 5 MISC: 3 refills | Status: DC

## 2023-07-14 MED ORDER — OMNIPOD 5 DEXG7G6 INTRO GEN 5 KIT: PACK | 0 refills | Status: DC

## 2023-07-14 NOTE — Telephone Encounter (Signed)
Unfortunately the labs on file don't qualify. The patient's c-peptide is 1.86, but Medicare requires it be lower than 110% of the lower limit (0.8 * 110% = 0.88). The below labs could be used in place of a qualifying c-peptide: a. ICA (Islet Cell Autoantibodies) and /or Glutamic Acid Decarboxylase (GAD65) - CPT Code 16109  b. IAA (Insulin Autoantibody) - CPT Code 60454

## 2023-07-14 NOTE — Addendum Note (Signed)
Addended by: Lisabeth Pick on: 07/14/2023 10:53 AM   Modules accepted: Orders

## 2023-07-14 NOTE — Telephone Encounter (Signed)
Orders has been sent to [facility] pharmacies

## 2023-07-15 ENCOUNTER — Other Ambulatory Visit (HOSPITAL_COMMUNITY): Payer: Self-pay

## 2023-07-20 ENCOUNTER — Other Ambulatory Visit: Payer: Self-pay

## 2023-07-20 ENCOUNTER — Other Ambulatory Visit (HOSPITAL_COMMUNITY): Payer: Self-pay

## 2023-07-20 ENCOUNTER — Other Ambulatory Visit: Payer: Self-pay | Admitting: Internal Medicine

## 2023-07-20 MED ORDER — TIRZEPATIDE 5 MG/0.5ML ~~LOC~~ SOAJ: 5.0000 mg | SUBCUTANEOUS | 3 refills | Status: DC

## 2023-07-21 ENCOUNTER — Other Ambulatory Visit: Payer: Self-pay | Admitting: Internal Medicine

## 2023-07-21 ENCOUNTER — Other Ambulatory Visit (HOSPITAL_COMMUNITY): Payer: Self-pay

## 2023-07-21 ENCOUNTER — Ambulatory Visit
Admission: RE | Admit: 2023-07-21 | Discharge: 2023-07-21 | Disposition: A | Discharge status: Home / Self Care | Payer: Medicare HMO | Source: Ambulatory Visit | Attending: Internal Medicine | Admitting: Internal Medicine

## 2023-07-21 ENCOUNTER — Other Ambulatory Visit: Payer: Self-pay

## 2023-07-21 DIAGNOSIS — I4819 Other persistent atrial fibrillation: Secondary | ICD-10-CM

## 2023-07-21 DIAGNOSIS — Z0000 Encounter for general adult medical examination without abnormal findings: Secondary | ICD-10-CM

## 2023-07-21 DIAGNOSIS — Z1231 Encounter for screening mammogram for malignant neoplasm of breast: Secondary | ICD-10-CM | POA: Diagnosis not present

## 2023-07-21 MED ORDER — APIXABAN 5 MG PO TABS
5.0000 mg | ORAL_TABLET | Freq: Two times a day (BID) | ORAL | 0 refills | Status: DC
  Filled 2023-07-21: qty 180, 90d supply, fill #0

## 2023-07-21 NOTE — Progress Notes (Signed)
 Name: Emily Farmer  Age/ Sex: 68 y.o., female   MRN/ DOB: [id number], 1955-08-15     PCP: Vicci Barnie NOVAK, MD   Reason for Endocrinology Evaluation: Type 2 Diabetes Mellitus  Initial Endocrine Consultative Visit: 09/26/2019    PATIENT IDENTIFIER: Emily Farmer is a 68 y.o. female with a past medical history of DM, HTN, A.Fib , S/P pacemaker, CAD(S/P CABG 2024)   and dyslipidemia. The patient has followed with Endocrinology clinic since 09/26/2019 for consultative assistance with management of her diabetes.  DIABETIC HISTORY:  Emily Farmer was diagnosed with DM in 2013, has been on insulin  and metformin  since diagnosis. Her hemoglobin A1c has ranged from 9.5% in 2020, peaking at 12.2% in 2019    On initial visit to our clinic, her A1c was 12.4% . She was on Lantus  and metformin  and we added Novolog     Stopped metformin  05/2021 and started Jardiance   Trulicity  caused diarrhea   Lives with sister    She was trained on Dexcom 09/2022  Discontinue pioglitazone  06/2023 due to lower extremity edema  SUBJECTIVE:   During the last visit (06/24/2023): A1c 10.2%   Today (07/22/2023): Emily Farmer is here for a follow up on diabetes management.  She checks her blood sugars 2-3 time a day .SABRA The patient has not had hypoglycemic episodes since the last clinic visit.   She did not bring her meter today  Since her last visit here she was diagnosed with CAD, s/p CABG x 3 02/2023.  As well as s/p ablation 12/2022  Has occasional LE edema  Denies nausea or vomiting  Denies constipation or diarrhea    HOME DIABETES REGIMEN:  Jardiance  25 mg daily Mounjaro  5 mg weekly Lantus  20 units daily Humalog  8 unitsTIDQAC Correction scale : HUmalog  (BG -130/30)     Statin: Yes ACE-I/ARB: no  METER DOWNLOAD SUMMARY:n/a     DIABETIC COMPLICATIONS: Microvascular complications:  Fluctuating GFR, cataract, neuropathy  Last eye exam: Completed 10/22/2022 Macrovascular complications:    Denies:  CAD, PVD, CVA      HISTORY:  Past Medical History:  Past Medical History:  Diagnosis Date   Arthritis    Atrial fibrillation (HCC)    a. 09/2016 in setting of pancreatitis;  b. 09/2016 Echo: EF 65-60%, no rwma, Gr2 DD, mild MR, triv TR, PASP ;  CHA2DS2VASc = 4-->Eliquis  5mg  BID. c. Recurred 06/2018 in setting of GIB.   Chronic diastolic CHF (congestive heart failure) (HCC) 01/28/2018   Diabetes mellitus    GI bleeding 06/2018   Gout    Hyperlipidemia    Hypertension    Hypertriglyceridemia    Pancreatitis    a. 09/2016 - Triglycerides 1,392 on admission.   Past Surgical History:  Past Surgical History:  Procedure Laterality Date   AV NODE ABLATION N/A 01/02/2023   Procedure: AV NODE ABLATION;  Surgeon: Waddell Danelle ORN, MD;  Location: [facility] INVASIVE CV LAB;  Service: Cardiovascular;  Laterality: N/A;   BIOPSY  06/20/2018   Procedure: BIOPSY;  Surgeon: Rosalie Kitchens, MD;  Location[facility] ENDOSCOPY;  Service: Endoscopy;;   CHOLECYSTECTOMY N/A 01/04/2014   Procedure: LAPAROSCOPIC CHOLECYSTECTOMY WITH INTRAOPERATIVE CHOLANGIOGRAM;  Surgeon: Lynda Leos, MD;  Location: [facility] OR;  Service: General;  Laterality: N/A;   COLONOSCOPY WITH PROPOFOL  N/A 06/21/2018   Procedure: COLONOSCOPY WITH PROPOFOL ;  Surgeon: Donnald Charleston, MD;  Location: [facility] ENDOSCOPY;  Service: Endoscopy;  Laterality: N/A;   CORONARY ARTERY BYPASS GRAFT N/A 03/09/2023   Procedure: CORONARY ARTERY BYPASS GRAFTING (  CABG) TIMES THREE USING THE LEFT INTERNAL MAMMARY ARTERY (LIMA) AND ENDOSCOPICALLY HARVESTED RIGHT GREATER SAPHENOUS VEIN;  Surgeon: Lucas Dorise POUR, MD;  Location: [facility] OR;  Service: Open Heart Surgery;  Laterality: N/A;   ESOPHAGOGASTRODUODENOSCOPY (EGD) WITH PROPOFOL  N/A 06/20/2018   Procedure: ESOPHAGOGASTRODUODENOSCOPY (EGD) WITH PROPOFOL ;  Surgeon: Rosalie Kitchens, MD;  Location: [facility] ENDOSCOPY;  Service: Endoscopy;  Laterality: N/A;   IR THORACENTESIS ASP PLEURAL SPACE W/IMG GUIDE  03/16/2023   LUNG SURGERY     PACEMAKER  IMPLANT N/A 03/03/2022   Procedure: PACEMAKER IMPLANT;  Surgeon: Waddell Danelle ORN, MD;  Location: [facility] INVASIVE CV LAB;  Service: Cardiovascular;  Laterality: N/A;   RIGHT HEART CATH N/A 03/06/2023   Procedure: RIGHT HEART CATH;  Surgeon: Rolan Ezra RAMAN, MD;  Location: [facility] INVASIVE CV LAB;  Service: Cardiovascular;  Laterality: N/A;   RIGHT/LEFT HEART CATH AND CORONARY ANGIOGRAPHY N/A 03/03/2023   Procedure: RIGHT/LEFT HEART CATH AND CORONARY ANGIOGRAPHY;  Surgeon: Anner Alm ORN, MD;  Location: [facility] INVASIVE CV LAB;  Service: Cardiovascular;  Laterality: N/A;   TEE WITHOUT CARDIOVERSION N/A 03/09/2023   Procedure: TRANSESOPHAGEAL ECHOCARDIOGRAM;  Surgeon: Lucas Dorise POUR, MD;  Location: [facility] OR;  Service: Open Heart Surgery;  Laterality: N/A;   Social History:  reports that she has never smoked. She has never used smokeless tobacco. She reports that she does not drink alcohol  and does not use drugs. Family History:  Family History  Problem Relation Age of Onset   Heart attack Mother    Heart attack Father    Diabetes Sister    High blood pressure Neg Hx    High Cholesterol Neg Hx      HOME MEDICATIONS: Allergies as of 07/22/2023       Reactions   Trulicity  [dulaglutide ] Diarrhea        Medication List        Accurate as of July 22, 2023  8:54 AM. If you have any questions, ask your nurse or doctor.          Accu-Chek Guide Me w/Device Kit Use to check blood sugar 3 times daily.   Accu-Chek Guide Test test strip Generic drug: glucose blood Use as instructed   Accu-Chek Softclix Lancets lancets Use to check blood sugar 3 times daily.   acetaminophen  325 MG tablet Commonly known as: TYLENOL  Take 2 tablets (650 mg total) by mouth every 6 (six) hours as needed for mild pain (or Fever >/= 101).   apixaban  5 MG Tabs tablet Commonly known as: ELIQUIS  Take 1 tablet (5 mg total) by mouth in the morning and at bedtime.   aspirin  EC 81 MG tablet Take 1 tablet (81 mg total) by  mouth daily. Swallow whole.   atorvastatin  40 MG tablet Commonly known as: LIPITOR Take 1 tablet (40 mg total) by mouth daily.   Blood Pressure Monitor/Arm Devi Check blood pressure once per day at least 2 hours after medications.   CENTRUM SILVER  PO Take 1 tablet by mouth daily.   Ocuvite Adult 50+ Caps Take 1 capsule by mouth daily.   Dexcom G7 Sensor Misc 1 Device by Does not apply route as directed.   diclofenac  Sodium 1 % Gel Commonly known as: Voltaren  Apply 2 g topically 4 (four) times daily. Apply across lower back for back pain What changed:  when to take this reasons to take this   Droplet Pen Needles 32G X 4 MM Misc Generic drug: Insulin  Pen Needle Use in the morning, at noon, in the  evening, and at bedtime.   fenofibrate  48 MG tablet Commonly known as: Tricor  Take 1 tablet (48 mg total) by mouth daily.   FeroSul 325 (65 Fe) MG tablet Generic drug: ferrous sulfate  Take 1 tablet (325 mg total) by mouth daily with breakfast.   insulin  lispro 100 UNIT/ML KwikPen Commonly known as: HumaLOG  KwikPen Max daily 45 units   Insulin  Syringe-Needle U-100 31G X 5/16 0.3 ML Misc Commonly known as: TRUEplus Insulin  Syringe Use to inject insulin  daily. What changed:  how much to take how to take this when to take this   Jardiance  25 MG Tabs tablet Generic drug: empagliflozin  Take 1 tablet (25 mg total) by mouth daily before breakfast.   Lantus  SoloStar 100 UNIT/ML Solostar Pen Generic drug: insulin  glargine Inject 20 Units into the skin daily.   lidocaine  5 % Commonly known as: Lidoderm  Place 1 patch onto the skin daily. Remove & Discard patch within 12 hours or as directed by MD   Omnipod 5 DexG7G6 Intro Gen 5 Kit Change pod every 3 days   Omnipod 5 DexG7G6 Pods Gen 5 Misc Change pods every 3 days   tirzepatide  5 MG/0.5ML Pen Commonly known as: MOUNJARO  Inject 5 mg into the skin once a week.   torsemide  20 MG tablet Commonly known as: DEMADEX  Take  1 tablet (20 mg) once a day, Take extra dose of 20 mg as needed for sudden weight gain and leg swelling What changed:  how much to take how to take this when to take this reasons to take this additional instructions         OBJECTIVE:   Vital Signs: BP 122/80 (BP Location: Left Arm, Patient Position: Sitting, Cuff Size: Normal)   Pulse 84   Ht 5' 3 (1.6 m)   Wt 174 lb (78.9 kg)   SpO2 97%   BMI 30.82 kg/m   Wt Readings from Last 3 Encounters:  07/22/23 174 lb (78.9 kg)  06/04/23 167 lb 3.2 oz (75.8 kg)  05/18/23 167 lb 1.6 oz (75.8 kg)     Exam: General: Pt appears well and is in NAD  Lungs: Clear with good BS bilat   Heart: RRR  Extremities: No pretibial edema.   Neuro: MS is good with appropriate affect, pt is alert and Ox3   DM foot exam: 06/24/2023   The skin of the feet is intact without sores or ulcerations,with thickened yellow toe nail The pedal pulses are undetectable  The sensation is decreased  to a screening 5.07, 10 gram monofilament bilaterally  DATA REVIEWED:  Lab Results  Component Value Date   HGBA1C 9.1 (A) 06/24/2023   HGBA1C 8.0 (H) 03/04/2023   HGBA1C 7.8 (A) 01/13/2023    Latest Reference Range & Units 05/13/23 21:10  Sodium 135 - 145 mmol/L 138  Potassium 3.5 - 5.1 mmol/L 3.9  Chloride 98 - 111 mmol/L 106  CO2 22 - 32 mmol/L 19 (L)  Glucose 70 - 99 mg/dL 893 (H)  BUN 8 - 23 mg/dL 57 (H)  Creatinine 9.55 - 1.00 mg/dL 8.51 (H)  Calcium  8.9 - 10.3 mg/dL 9.2  Anion gap 5 - 15  13  Alkaline Phosphatase 38 - 126 U/L 44  Albumin  3.5 - 5.0 g/dL 3.6  Lipase 11 - 51 U/L 83 (H)  AST 15 - 41 U/L 23  ALT 0 - 44 U/L 14  Total Protein 6.5 - 8.1 g/dL 7.0  Total Bilirubin <8.7 mg/dL 0.4  GFR, Estimated >39 mL/min 39 (L)  ASSESSMENT / PLAN / RECOMMENDATIONS:   1) Type 2 Diabetes Mellitus, Poorly controlled, With neuropathic and macrovascular complications - Most recent A1c of 9.1 %   - A1c remains above goal -In office BG 278 Mg/DL,  but patient fasting was 170 Mg/DL drank a glass of milk -She is intolerant to Trulicity   -Intolerant to pioglitazone  due to lower extremity edema -She did not qualify for the bionic iLet pump, we are working on obtaining OmniPod -Referral for training has been placed to our CDE -Limited glucose data as she does not have meter today -We switch Victoza  to Mounjaro  due to insurance requirements, no changes to the insulin  as she has not started Mounjaro  yet   MEDICATIONS:  -Continue Jardiace 25 mg daily  -Continue Lantus  20 units daily  -Continue Humalog  8 units with EACH meal  -Switch Victoza  to Mounjaro  5 mg weekly    EDUCATION / INSTRUCTIONS: BG monitoring instructions: Patient is instructed to check her blood sugars 3 times a day, before meals. I reviewed the Rule of 15 for the treatment of hypoglycemia in detail with the patient. Literature supplied.    2) Diabetic complications:  Eye: Does not have known diabetic retinopathy.  Neuro/ Feet: Does have known diabetic peripheral neuropathy. Renal: Patient does have known baseline CKD. She is not on an ACEI/ARB at present.   3) Dyslipidemia :  -A refill of atorvastatin  40 mg [facility]    F/U in 2 months   Signed electronically by: Stefano Redgie Butts, MD  [facility] Endocrinology  [facility] [address] [place] [place], [place] [address] Phone: [phone] FAX: [phone]   CC: Vicci Barnie NOVAK, MD [address] [place] [address] Phone: [phone]  Fax: [phone]  Return to Endocrinology clinic as below: Future Appointments  Date Time Provider Department Center  07/22/2023  9:50 AM Huntley Knoop, Donell Redgie, MD [facility] None  08/06/2023  1:30 PM Vicci Barnie NOVAK, MD [facility] None  08/12/2023  3:35 PM Duke, Jon Garre, PA [facility] None  09/22/2023 10:30 AM Carrieann Spielberg, Donell Redgie, MD [facility] None

## 2023-07-21 NOTE — Telephone Encounter (Signed)
 Requested Prescriptions  Pending Prescriptions Disp Refills   apixaban  (ELIQUIS ) 5 MG TABS tablet 180 tablet 0    Sig: Take 1 tablet (5 mg total) by mouth in the morning and at bedtime.     Hematology:  Anticoagulants - apixaban  Failed - 07/21/2023  4:33 PM      Failed - HGB in normal range and within 360 days    Hemoglobin  Date Value Ref Range Status  05/13/2023 11.6 (L) 12.0 - 15.0 g/dL Final  93/75/7975 87.1 11.1 - 15.9 g/dL Final         Failed - Cr in normal range and within 360 days    Creat  Date Value Ref Range Status  07/02/2023 1.29 (H) 0.50 - 1.05 mg/dL Final   Creatinine,U  Date Value Ref Range Status  12/12/2021 43.3 mg/dL Final         Passed - PLT in normal range and within 360 days    Platelets  Date Value Ref Range Status  05/13/2023 196 150 - 400 K/uL Final  12/08/2022 249 150 - 450 x10E3/uL Final         Passed - HCT in normal range and within 360 days    HCT  Date Value Ref Range Status  05/13/2023 37.3 36.0 - 46.0 % Final   Hematocrit  Date Value Ref Range Status  12/08/2022 39.4 34.0 - 46.6 % Final         Passed - AST in normal range and within 360 days    AST  Date Value Ref Range Status  05/13/2023 23 15 - 41 U/L Final         Passed - ALT in normal range and within 360 days    ALT  Date Value Ref Range Status  05/13/2023 14 0 - 44 U/L Final         Passed - Valid encounter within last 12 months    Recent Outpatient Visits           2 months ago Coronary artery disease involving native coronary artery of native heart without angina pectoris   [facility] [place] Vicci Sober B, MD   2 months ago Type 2 diabetes mellitus with hyperglycemia, with long-term current use of insulin  [facility])   [facility] [place] [place], Jon M, [place]   6 months ago Type 2 diabetes mellitus with hyperglycemia, with long-term current use of  insulin  [facility])   [facility] [place] Fleeta Morris, [place] L, RPH-CPP   7 months ago Type 2 diabetes mellitus with hyperglycemia, with long-term current use of insulin  [facility])   [facility] [place]. [facility] Fleeta Morris, [place] L, RPH-CPP   8 months ago Type 2 diabetes mellitus with hyperglycemia, with long-term current use of insulin  [facility])   [facility] Of [place]. [facility] Vicci Sober NOVAK, MD       Future Appointments             In 2 weeks Vicci Sober NOVAK, MD [facility] [facility] Of [place]. [facility]   In 3 weeks Duke, Jon Garre, [place] [facility]

## 2023-07-22 ENCOUNTER — Other Ambulatory Visit: Payer: Self-pay

## 2023-07-22 ENCOUNTER — Other Ambulatory Visit (HOSPITAL_COMMUNITY): Payer: Self-pay

## 2023-07-22 ENCOUNTER — Ambulatory Visit (INDEPENDENT_AMBULATORY_CARE_PROVIDER_SITE_OTHER): Payer: Medicare HMO | Admitting: Internal Medicine

## 2023-07-22 ENCOUNTER — Encounter: Payer: Self-pay | Admitting: Internal Medicine

## 2023-07-22 VITALS — BP 122/80 | HR 84 | Ht 63.0 in | Wt 174.0 lb

## 2023-07-22 DIAGNOSIS — I5032 Chronic diastolic (congestive) heart failure: Secondary | ICD-10-CM | POA: Diagnosis not present

## 2023-07-22 DIAGNOSIS — E1142 Type 2 diabetes mellitus with diabetic polyneuropathy: Secondary | ICD-10-CM | POA: Diagnosis not present

## 2023-07-22 DIAGNOSIS — Z794 Long term (current) use of insulin: Secondary | ICD-10-CM

## 2023-07-22 DIAGNOSIS — E1159 Type 2 diabetes mellitus with other circulatory complications: Secondary | ICD-10-CM

## 2023-07-22 DIAGNOSIS — N1832 Chronic kidney disease, stage 3b: Secondary | ICD-10-CM | POA: Diagnosis not present

## 2023-07-22 DIAGNOSIS — E1165 Type 2 diabetes mellitus with hyperglycemia: Secondary | ICD-10-CM | POA: Diagnosis not present

## 2023-07-22 DIAGNOSIS — I130 Hypertensive heart and chronic kidney disease with heart failure and stage 1 through stage 4 chronic kidney disease, or unspecified chronic kidney disease: Secondary | ICD-10-CM | POA: Diagnosis not present

## 2023-07-22 DIAGNOSIS — I495 Sick sinus syndrome: Secondary | ICD-10-CM | POA: Diagnosis not present

## 2023-07-22 DIAGNOSIS — E785 Hyperlipidemia, unspecified: Secondary | ICD-10-CM

## 2023-07-22 DIAGNOSIS — E1122 Type 2 diabetes mellitus with diabetic chronic kidney disease: Secondary | ICD-10-CM | POA: Diagnosis not present

## 2023-07-22 DIAGNOSIS — I2510 Atherosclerotic heart disease of native coronary artery without angina pectoris: Secondary | ICD-10-CM | POA: Diagnosis not present

## 2023-07-22 DIAGNOSIS — I480 Paroxysmal atrial fibrillation: Secondary | ICD-10-CM | POA: Diagnosis not present

## 2023-07-22 DIAGNOSIS — D631 Anemia in chronic kidney disease: Secondary | ICD-10-CM | POA: Diagnosis not present

## 2023-07-22 DIAGNOSIS — I051 Rheumatic mitral insufficiency: Secondary | ICD-10-CM | POA: Diagnosis not present

## 2023-07-22 LAB — POCT GLUCOSE (DEVICE FOR HOME USE): POC Glucose: 278 mg/dL — AB (ref 70–99)

## 2023-07-22 MED ORDER — ATORVASTATIN CALCIUM 40 MG PO TABS: 40.0000 mg | ORAL_TABLET | Freq: Every day | ORAL | 3 refills | Status: DC

## 2023-07-22 MED ORDER — INSULIN PEN NEEDLE 32G X 4 MM MISC: 1.0000 | Freq: Four times a day (QID) | 3 refills | Status: DC

## 2023-07-22 NOTE — Patient Instructions (Addendum)
-   Switch Victoza  to Mounjaro  5 mg weekly - Continue Jardiance  25 mg daily  - Continue  Lantus  20 units daily  - Continue  Humalog  8 units with EACH meal    HOW TO TREAT LOW BLOOD SUGARS (Blood sugar LESS THAN 70 MG/DL) Please follow the RULE OF 15 for the treatment of hypoglycemia treatment (when your (blood sugars are less than 70 mg/dL)   STEP 1: Take 15 grams of carbohydrates when your blood sugar is low, which includes:  3-4 GLUCOSE TABS  OR 3-4 OZ OF JUICE OR REGULAR SODA OR ONE TUBE OF GLUCOSE GEL    STEP 2: RECHECK blood sugar in 15 MINUTES STEP 3: If your blood sugar is still low at the 15 minute recheck --> then, go back to STEP 1 and treat AGAIN with another 15 grams of carbohydrates.

## 2023-07-27 ENCOUNTER — Other Ambulatory Visit: Payer: Self-pay

## 2023-07-28 ENCOUNTER — Other Ambulatory Visit: Payer: Self-pay

## 2023-07-28 MED ORDER — OMNIPOD 5 DEXG7G6 INTRO GEN 5 KIT: PACK | 0 refills | Status: DC

## 2023-07-28 MED ORDER — OMNIPOD 5 DEXG7G6 PODS GEN 5 MISC
3 refills | Status: DC
Start: 2023-07-28 — End: 2023-07-29

## 2023-07-29 ENCOUNTER — Telehealth: Payer: Self-pay | Admitting: Internal Medicine

## 2023-07-29 ENCOUNTER — Other Ambulatory Visit: Payer: Self-pay

## 2023-07-29 MED ORDER — OMNIPOD 5 DEXG7G6 PODS GEN 5 MISC: 3 refills | Status: DC

## 2023-07-29 MED ORDER — OMNIPOD 5 DEXG7G6 INTRO GEN 5 KIT: PACK | 0 refills | Status: DC

## 2023-07-29 NOTE — Telephone Encounter (Signed)
Patient wants to have the c peptide and glucose repeat to see if she can qualify for the pump

## 2023-07-29 NOTE — Telephone Encounter (Signed)
Patient has intro kit and need to be scheduled for training. Patient also given number to call a week before she runs out of intro kit supplies so they can ship pods.

## 2023-07-29 NOTE — Telephone Encounter (Signed)
Patient is calling to ask if a blood test can be ordered for her insulin pump.

## 2023-07-31 ENCOUNTER — Other Ambulatory Visit: Payer: Self-pay

## 2023-07-31 ENCOUNTER — Other Ambulatory Visit (HOSPITAL_COMMUNITY): Payer: Self-pay

## 2023-08-04 ENCOUNTER — Other Ambulatory Visit (HOSPITAL_COMMUNITY): Payer: Self-pay

## 2023-08-04 ENCOUNTER — Telehealth: Payer: Self-pay | Admitting: Nutrition

## 2023-08-04 NOTE — Telephone Encounter (Signed)
Tried calling [employer]  The number is not connecting.  Tried sister's number and left message with my phone number to call me.

## 2023-08-04 NOTE — Telephone Encounter (Signed)
Tried to call [employer] yesterday and today.  Phone number not working.   Tried calling sister and left message with my number to call to schedule pump training.

## 2023-08-05 ENCOUNTER — Other Ambulatory Visit (HOSPITAL_COMMUNITY): Payer: Self-pay

## 2023-08-05 NOTE — Telephone Encounter (Signed)
Patient is asking if someone will be able to assist her to put on her insulin pump at her appointment.  States she has an Optima pump.   Please adivse.

## 2023-08-05 NOTE — Telephone Encounter (Signed)
Copied from CRM [id number]. Topic: Clinical - Request for Lab/Test Order >> Aug 05, 2023  9:53 AM Dennison Nancy wrote: Reason for CRM: Patient request if can get a blood test for her insulin pump on 10/06/23 same day of her appointment which is on 10/06/23 And when do the test please call Shanda Bumps [phone]  where the machine for her insulin pump  Please contact patient back to let know if can do the blood test on the 10/06/23

## 2023-08-06 ENCOUNTER — Ambulatory Visit: Payer: Medicare HMO | Admitting: Internal Medicine

## 2023-08-07 ENCOUNTER — Telehealth: Payer: Self-pay

## 2023-08-07 ENCOUNTER — Other Ambulatory Visit: Payer: Self-pay

## 2023-08-07 ENCOUNTER — Other Ambulatory Visit (HOSPITAL_COMMUNITY): Payer: Self-pay

## 2023-08-07 NOTE — Telephone Encounter (Signed)
Received notification from [employer]) regarding approval for [employer]. Patient assistance approved from 07/10/2023 to 06/15/2024.  Medication will ship to [address] [place] [address]   Company phone: [phone]

## 2023-08-07 NOTE — Telephone Encounter (Signed)
Patient is aware that assistance for this could be addressed in appointment with Endocrinology.

## 2023-08-10 ENCOUNTER — Other Ambulatory Visit: Payer: Self-pay

## 2023-08-10 ENCOUNTER — Telehealth: Payer: Self-pay | Admitting: Nutrition

## 2023-08-11 ENCOUNTER — Telehealth: Payer: Self-pay | Admitting: Nutrition

## 2023-08-11 NOTE — Progress Notes (Unsigned)
 Cardiology Office Note:    Date:  08/12/2023   ID:  Emily Farmer, DOB [birth year], MRN [id number]  PCP:  Marcine Matar, MD   [facility] Providers Cardiologist:  Chrystie Nose, MD Cardiology APP:  Marcelino Duster, PA  Electrophysiologist:  Lanier Prude, MD     Referring MD: Marcine Matar, MD   Chief Complaint  Patient presents with   Follow-up    CAD s/p CABG    History of Present Illness:    Emily Farmer is a 68 y.o. female with a hx of CAD s/p CABG x 3 with SVG-LAD, SVG-OM, SVG-PDA, on 03/09/2023, permanent A-fib s/p AV node ablation and single-chamber PPM 12/2022, DM2 on insulin, hyperlipidemia.  Approximately 2 months after ablation and PPM placement, she presented with chest heaviness and shortness of breath on exertion.  Echocardiogram 03/02/2023 showed an EF 60 to 65%, no WMA, mildly reduced RV, severely elevated PASP, mild MR.  She underwent right left heart catheterization 03/03/2023 that showed multivessel CAD and severe pulmonary hypertension.  She was referred to advanced heart failure clinic who titrated diuresis SGLT2 inhibitor and recommended CABG.  She underwent CABG times 3 on 03/09/23. LIMA was small with slow blood flow and felt not suitable as a bypass conduit.  Postop course has been complicated by preoperative deconditioning.  She required midodrine wean to 5 mg twice daily prior to discharge.  She restarted her home 100 mg Toprol and 120 mg Cardizem, midodrine was discontinued at CT surgery follow-up.  She has had subsequent ER visits for chest discomfort, last ER visit was the same day she saw CT surgery in the office -05/13/2023.  I saw her for routine cardiology follow-up 06/04/2023.  She is in an area in which her pacemaker transmitter does not work due to cell tower coverage.  Confirmed with device clinic.  She was very confused about her diuretic regimen when I saw her with an AKI.  She was possibly taking furosemide and torsemide  simultaneously.  I discontinued furosemide and continue 20 mg torsemide as needed.  She presents for follow up. She again forgot to bring her medications. She had a brief episode of chest discomfort during her mammogram.  She did not bring a weight log.  She thinks that she has gained about 20 pounds since her discharge from rehab in October.  She thinks that this is due to poor diet, and I think that I agree.  She lives with her sister and her sister cooks fried foods many nights.  She is also eating sugar and simple carbs.  On exam she may be mildly volume up -she is taking torsemide 4-5 times per week.   Past Medical History:  Diagnosis Date   Arthritis    Atrial fibrillation (HCC)    a. 09/2016 in setting of pancreatitis;  b. 09/2016 Echo: EF 65-60%, no rwma, Gr2 DD, mild MR, triv TR, PASP ;  CHA2DS2VASc = 4-->Eliquis 5mg  BID. c. Recurred 06/2018 in setting of GIB.   Chronic diastolic CHF (congestive heart failure) (HCC) 01/28/2018   Diabetes mellitus    GI bleeding 06/2018   Gout    Hyperlipidemia    Hypertension    Hypertriglyceridemia    Pancreatitis    a. 09/2016 - Triglycerides 1,392 on admission.    Past Surgical History:  Procedure Laterality Date   AV NODE ABLATION N/A 01/02/2023   Procedure: AV NODE ABLATION;  Surgeon: Marinus Maw, MD;  Location: [facility] INVASIVE CV LAB;  Service: Cardiovascular;  Laterality: N/A;   BIOPSY  06/20/2018   Procedure: BIOPSY;  Surgeon: Vida Rigger, MD;  Location: [facility] ENDOSCOPY;  Service: Endoscopy;;   CHOLECYSTECTOMY N/A 01/04/2014   Procedure: LAPAROSCOPIC CHOLECYSTECTOMY WITH INTRAOPERATIVE CHOLANGIOGRAM;  Surgeon: Axel Filler, MD;  Location: [facility] OR;  Service: General;  Laterality: N/A;   COLONOSCOPY WITH PROPOFOL N/A 06/21/2018   Procedure: COLONOSCOPY WITH PROPOFOL;  Surgeon: Bernette Redbird, MD;  Location: [facility] ENDOSCOPY;  Service: Endoscopy;  Laterality: N/A;   CORONARY ARTERY BYPASS GRAFT N/A 03/09/2023   Procedure: CORONARY ARTERY BYPASS  GRAFTING (CABG) TIMES THREE USING THE LEFT INTERNAL MAMMARY ARTERY (LIMA) AND ENDOSCOPICALLY HARVESTED RIGHT GREATER SAPHENOUS VEIN;  Surgeon: [name], MD;  Location: [facility] OR;  Service: Open Heart Surgery;  Laterality: N/A;   ESOPHAGOGASTRODUODENOSCOPY (EGD) WITH PROPOFOL N/A 06/20/2018   Procedure: ESOPHAGOGASTRODUODENOSCOPY (EGD) WITH PROPOFOL;  Surgeon: Vida Rigger, MD;  Location: [facility] ENDOSCOPY;  Service: Endoscopy;  Laterality: N/A;   IR THORACENTESIS ASP PLEURAL SPACE W/IMG GUIDE  03/16/2023   LUNG SURGERY     PACEMAKER IMPLANT N/A 03/03/2022   Procedure: PACEMAKER IMPLANT;  Surgeon: Marinus Maw, MD;  Location: [facility] INVASIVE CV LAB;  Service: Cardiovascular;  Laterality: N/A;   RIGHT HEART CATH N/A 03/06/2023   Procedure: RIGHT HEART CATH;  Surgeon: Laurey Morale, MD;  Location: [facility] INVASIVE CV LAB;  Service: Cardiovascular;  Laterality: N/A;   RIGHT/LEFT HEART CATH AND CORONARY ANGIOGRAPHY N/A 03/03/2023   Procedure: RIGHT/LEFT HEART CATH AND CORONARY ANGIOGRAPHY;  Surgeon: Marykay Lex, MD;  Location: [facility] INVASIVE CV LAB;  Service: Cardiovascular;  Laterality: N/A;   TEE WITHOUT CARDIOVERSION N/A 03/09/2023   Procedure: TRANSESOPHAGEAL ECHOCARDIOGRAM;  Surgeon: [name], MD;  Location: [facility] OR;  Service: Open Heart Surgery;  Laterality: N/A;    Current Medications: Current Meds  Medication Sig   Accu-Chek Softclix Lancets lancets Use to check blood sugar 3 times daily.   acetaminophen (TYLENOL) 325 MG tablet Take 2 tablets (650 mg total) by mouth every 6 (six) hours as needed for mild pain (or Fever >/= 101).   apixaban (ELIQUIS) 5 MG TABS tablet Take 1 tablet (5 mg total) by mouth in the morning and at bedtime.   aspirin EC 81 MG tablet Take 1 tablet (81 mg total) by mouth daily. Swallow whole.   atorvastatin (LIPITOR) 40 MG tablet Take 1 tablet (40 mg total) by mouth daily.   Blood Glucose Monitoring Suppl (ACCU-CHEK GUIDE) w/Device KIT Use to check blood sugar 3 times  daily.   Blood Pressure Monitoring (BLOOD PRESSURE MONITOR/ARM) DEVI Check blood pressure once per day at least 2 hours after medications.   Continuous Glucose Sensor (DEXCOM G7 SENSOR) MISC 1 Device by Does not apply route as directed.   diclofenac Sodium (VOLTAREN) 1 % GEL Apply 2 g topically 4 (four) times daily. Apply across lower back for back pain (Patient taking differently: Apply 2 g topically 4 (four) times daily as needed (pain). Apply across lower back for back pain)   empagliflozin (JARDIANCE) 25 MG TABS tablet Take 1 tablet (25 mg total) by mouth daily before breakfast.   fenofibrate (TRICOR) 48 MG tablet Take 1 tablet (48 mg total) by mouth daily.   ferrous sulfate 325 (65 FE) MG tablet Take 1 tablet (325 mg total) by mouth daily with breakfast.   glucose blood (TRUE METRIX BLOOD GLUCOSE TEST) test strip Use as instructed   Insulin Disposable Pump (OMNIPOD 5 DEXG7G6 INTRO GEN 5) KIT Change pod  every 3 days   Insulin Disposable Pump (OMNIPOD 5 DEXG7G6 INTRO GEN 5) KIT Change pod every 3 days   Insulin Disposable Pump (OMNIPOD 5 DEXG7G6 PODS GEN 5) MISC Change pods every 3 days   Insulin Disposable Pump (OMNIPOD 5 DEXG7G6 PODS GEN 5) MISC Change pod every 3 days   insulin glargine (LANTUS SOLOSTAR) 100 UNIT/ML Solostar Pen Inject 20 Units into the skin daily.   insulin lispro (HUMALOG KWIKPEN) 100 UNIT/ML KwikPen Max daily 45 units   Insulin Pen Needle 32G X 4 MM MISC 1 Device by Does not apply route in the morning, at noon, in the evening, and at bedtime.   Insulin Syringe-Needle U-100 (TRUEPLUS INSULIN SYRINGE) 31G X 5/16" 0.3 ML MISC Use to inject insulin daily. (Patient taking differently: 1 each by Other route See admin instructions. Use to inject insulin daily.)   lidocaine (LIDODERM) 5 % Place 1 patch onto the skin daily. Remove & Discard patch within 12 hours or as directed by MD   Multiple Vitamins-Minerals (CENTRUM SILVER PO) Take 1 tablet by mouth daily.   Multiple  Vitamins-Minerals (OCUVITE ADULT 50+) CAPS Take 1 capsule by mouth daily.   tirzepatide [facility]) 5 MG/0.5ML Pen Inject 5 mg into the skin once a week.   torsemide (DEMADEX) 20 MG tablet Take 1 tablet (20 mg) once a day, Take extra dose of 20 mg as needed for sudden weight gain and leg swelling (Patient taking differently: Take 20 mg by mouth daily as needed. Take 40 mg daily for 3 days, then take 20 mg daily)     Allergies:   Trulicity [dulaglutide]   Social History   Socioeconomic History   Marital status: Single    Spouse name: Not on file   Number of children: Not on file   Years of education: Not on file   Highest education level: Not on file  Occupational History   Not on file  Tobacco Use   Smoking status: Never   Smokeless tobacco: Never  Vaping Use   Vaping status: Never Used  Substance and Sexual Activity   Alcohol use: No   Drug use: No   Sexual activity: Not Currently  Other Topics Concern   Not on file  Social History Narrative   Lives with her sister and does not need any assistance with ADLs.     Social Drivers of Health   Financial Resource Strain: Medium Risk (05/18/2023)   Overall Financial Resource Strain (CARDIA)    Difficulty of Paying Living Expenses: Somewhat hard  Food Insecurity: No Food Insecurity (06/25/2023)   Hunger Vital Sign    Worried About Running Out of Food in the Last Year: Never true    Ran Out of Food in the Last Year: Never true  Recent Concern: Food Insecurity - Food Insecurity Present (06/05/2023)   Hunger Vital Sign    Worried About Running Out of Food in the Last Year: Sometimes true    Ran Out of Food in the Last Year: Sometimes true  Transportation Needs: No Transportation Needs (06/25/2023)   PRAPARE - Administrator, Civil Service (Medical): No    Lack of Transportation (Non-Medical): No  Recent Concern: Transportation Needs - Unmet Transportation Needs (05/06/2023)   PRAPARE - Transportation    Lack of  Transportation (Medical): Yes    Lack of Transportation (Non-Medical): Yes  Physical Activity: Insufficiently Active (05/06/2023)   Exercise Vital Sign    Days of Exercise per Week: 7 days  Minutes of Exercise per Session: 20 min  Stress: No Stress Concern Present (05/18/2023)   [employer] of Occupational Health - Occupational Stress Questionnaire    Feeling of Stress : Not at all  Recent Concern: Stress - Stress Concern Present (05/06/2023)   [employer] of Occupational Health - Occupational Stress Questionnaire    Feeling of Stress : Very much  Social Connections: Socially Isolated (05/18/2023)   Social Connection and Isolation Panel [NHANES]    Frequency of Communication with Friends and Family: Once a week    Frequency of Social Gatherings with Friends and Family: Once a week    Attends Religious Services: Never    Database administrator or Organizations: No    Attends Engineer, structural: Never    Marital Status: Never married     Family History: The patient's family history includes Diabetes in her sister; Heart attack in her father and mother. There is no history of High blood pressure or High Cholesterol.  ROS:   Please see the history of present illness.     All other systems reviewed and are negative.  EKGs/Labs/Other Studies Reviewed:    The following studies were reviewed today: Cardiac Studies & Procedures   ______________________________________________________________________________________________ CARDIAC CATHETERIZATION  CARDIAC CATHETERIZATION 03/06/2023  Narrative 1.  Low right atrial pressure. 2. Normal PCWP.  Prominent V-waves but only mild MR by echo, may be due to diastolic dysfunction. 3. Mild primarily pulmonary arterial hypertension.  Significant improvement from prior study.   CARDIAC CATHETERIZATION 03/03/2023  Narrative   Prox RCA lesion is 99% stenosed. Subtotal Occlusion - TIMI 2 flow   Mid RCA lesion is 80%  stenosed.  Mid RCA to Dist RCA lesion is 90% stenosed. Dist RCA lesion is 70% stenosed.   Mid Cx lesion is 80% stenosed prior to distal OM-LPL   Mid LAD lesion is 50% stenosed. Dist LAD lesion is 90% stenosed.   2nd Diag lesion is 70% stenosed.   Hemodynamic findings consistent with severe pulmonary hypertension.   There is no aortic valve stenosis.   SUMMARY Multivessel disease-question if she would be a better candidate for CABG versus multivessel PCI given concerns for renal insufficiency and elevated PAP.   RECOMMENDATIONS Patient has multivessel disease, I will start her back on IV heparin 8 hours after sheath removal pending assessment for potential multivessel PCI versus CABG. Gentle hydration with diuresis today, have consulted advanced heart failure team for assistance in management and TUNE-UP prior to consideration of CVTS consultation versus multivessel PCI.   Case discussed with Drs. Janne Napoleon, Marca Ancona as well as the patient's sister [name].   Bryan Lemma, MD  Findings Coronary Findings Diagnostic  Dominance: Right  Left Anterior Descending Mid LAD lesion is 50% stenosed. Dist LAD lesion is 90% stenosed.  First Diagonal Branch Vessel is small in size.  Second Diagonal Branch Vessel is small in size. 2nd Diag lesion is 70% stenosed.  Second Septal Branch Vessel is small in size.  Left Circumflex Mid Cx lesion is 80% stenosed.  Left Posterior Atrioventricular Artery Vessel is small in size.  Right Coronary Artery Prox RCA lesion is 99% stenosed. Mid RCA lesion is 80% stenosed. Mid RCA to Dist RCA lesion is 90% stenosed. Dist RCA lesion is 70% stenosed.  Acute Marginal Branch Vessel is small in size.  Right Ventricular Branch Vessel is small in size.  Right Posterior Descending Artery Vessel is small in size. TIMI 2 FLOW Collaterals RPDA filled by collaterals from 3rd  Sept.  First Right Posterolateral Branch Vessel is small  in size.  Intervention  No interventions have been documented.     ECHOCARDIOGRAM  ECHOCARDIOGRAM COMPLETE 03/02/2023  Narrative ECHOCARDIOGRAM REPORT    Patient Name:   Emily Farmer Date of Exam: 03/02/2023 Medical Rec #:  [id number]     Height:       63.0 in Accession #:    [id number]    Weight:       180.8 lb Date of Birth:  06/24/1955    BSA:          1.852 m Patient Age:    66 years      BP:           122/69 mmHg Patient Gender: F             HR:           68 bpm. Exam Location:  Inpatient  Procedure: 2D Echo, Cardiac Doppler and Color Doppler  Indications:    Chest Pain  History:        Patient has prior history of Echocardiogram examinations, most recent 03/03/2022. CHF, Pacemaker, Arrythmias:Atrial Fibrillation; Risk Factors:Dyslipidemia and Hypertension.  Sonographer:    Melton Krebs RDCS, FE, PE Referring Phys: [id number] Cyndi Bender  IMPRESSIONS   1. Left ventricular ejection fraction, by estimation, is 60 to 65%. The left ventricle has normal function. The left ventricle has no regional wall motion abnormalities. Left ventricular diastolic parameters are indeterminate. Elevated left atrial pressure. There is the interventricular septum is flattened in systole and diastole, consistent with right ventricular pressure and volume overload. 2. Right ventricular systolic function is mildly reduced. The right ventricular size is moderately enlarged. There is severely elevated pulmonary artery systolic pressure. 3. Right atrial size was moderately dilated. 4. The mitral valve is normal in structure. Mild mitral valve regurgitation. No evidence of mitral stenosis. 5. Tricuspid valve regurgitation is moderate. 6. The aortic valve is calcified. Aortic valve regurgitation is not visualized. Aortic valve sclerosis/calcification is present, without any evidence of aortic stenosis. 7. The inferior vena cava is dilated in size with <50% respiratory variability, suggesting right  atrial pressure of 15 mmHg.  FINDINGS Left Ventricle: Left ventricular ejection fraction, by estimation, is 60 to 65%. The left ventricle has normal function. The left ventricle has no regional wall motion abnormalities. The left ventricular internal cavity size was normal in size. There is no left ventricular hypertrophy. The interventricular septum is flattened in systole and diastole, consistent with right ventricular pressure and volume overload. Left ventricular diastolic parameters are indeterminate. Elevated left atrial pressure.  Right Ventricle: The right ventricular size is moderately enlarged. Right ventricular systolic function is mildly reduced. There is severely elevated pulmonary artery systolic pressure. The tricuspid regurgitant velocity is 4.19 m/s, and with an assumed right atrial pressure of 15 mmHg, the estimated right ventricular systolic pressure is 85.2 mmHg.  Left Atrium: Left atrial size was normal in size.  Right Atrium: Right atrial size was moderately dilated.  Pericardium: There is no evidence of pericardial effusion.  Mitral Valve: The mitral valve is normal in structure. Mild mitral valve regurgitation. No evidence of mitral valve stenosis.  Tricuspid Valve: The tricuspid valve is normal in structure. Tricuspid valve regurgitation is moderate . No evidence of tricuspid stenosis.  Aortic Valve: The aortic valve is calcified. Aortic valve regurgitation is not visualized. Aortic valve sclerosis/calcification is present, without any evidence of aortic stenosis. Aortic valve mean gradient measures 5.0 mmHg. Aortic valve peak  gradient measures 8.8 mmHg. Aortic valve area, by VTI measures 2.02 cm.  Pulmonic Valve: The pulmonic valve was normal in structure. Pulmonic valve regurgitation is not visualized. No evidence of pulmonic stenosis.  Aorta: The aortic root is normal in size and structure.  Venous: The inferior vena cava is dilated in size with less than 50%  respiratory variability, suggesting right atrial pressure of 15 mmHg.  IAS/Shunts: No atrial level shunt detected by color flow Doppler.  Additional Comments: A device lead is visualized.   LEFT VENTRICLE PLAX 2D LVIDd:         4.10 cm   Diastology LVIDs:         2.60 cm   LV e' medial:    5.77 cm/s LV PW:         1.00 cm   LV E/e' medial:  20.6 LV IVS:        1.00 cm   LV e' lateral:   5.55 cm/s LVOT diam:     1.80 cm   LV E/e' lateral: 21.4 LV SV:         60 LV SV Index:   33 LVOT Area:     2.54 cm   RIGHT VENTRICLE RV S prime:     8.84 cm/s TAPSE (M-mode): 1.0 cm  LEFT ATRIUM             Index        RIGHT ATRIUM           Index LA diam:        4.00 cm 2.16 cm/m   RA Area:     16.40 cm LA Vol (A2C):   51.7 ml 27.91 ml/m  RA Volume:   40.90 ml  22.08 ml/m LA Vol (A4C):   70.8 ml 38.22 ml/m LA Biplane Vol: 65.2 ml 35.20 ml/m AORTIC VALVE AV Area (Vmax):    2.06 cm AV Area (Vmean):   1.99 cm AV Area (VTI):     2.02 cm AV Vmax:           148.00 cm/s AV Vmean:          103.000 cm/s AV VTI:            0.299 m AV Peak Grad:      8.8 mmHg AV Mean Grad:      5.0 mmHg LVOT Vmax:         120.00 cm/s LVOT Vmean:        80.700 cm/s LVOT VTI:          0.237 m LVOT/AV VTI ratio: 0.79  AORTA Ao Root diam: 2.80 cm  MITRAL VALVE                TRICUSPID VALVE MV Area (PHT): 3.61 cm     TR Peak grad:   70.2 mmHg MV Decel Time: 210 msec     TR Vmax:        419.00 cm/s MV E velocity: 119.00 cm/s MV A velocity: 30.90 cm/s   SHUNTS MV E/A ratio:  3.85         Systemic VTI:  0.24 m Systemic Diam: 1.80 cm  Olga Millers MD Electronically signed by Olga Millers MD Signature Date/Time: 03/02/2023/3:15:02 PM    Final   TEE  ECHO INTRAOPERATIVE TEE 03/09/2023  Narrative *INTRAOPERATIVE TRANSESOPHAGEAL REPORT *    Patient Name:   Emily Farmer  Date of Exam: 03/09/2023 Medical Rec #:  [id number]      Height:  63.0 in Accession #:    [id number]     Weight:        166.4 lb Date of Birth:  1955/07/02     BSA:          1.79 m Patient Age:    66 years       BP:           140/80 mmHg Patient Gender: F              HR:           60 bpm. Exam Location:  Anesthesiology  Transesophogeal exam was perform intraoperatively during surgical procedure. Patient was closely monitored under general anesthesia during the entirety of examination.  Indications:     CABG Performing Phys: [id number] [name] Diagnosing Phys: Jairo Ben MD  Complications: No known complications during this procedure. POST-OP IMPRESSIONS limited post-CPB exam: The patient separated easily from CPB. _ Left Ventricle: The left ventricular function is essentially unchanged from pre-bypass images. Contractility remains hyperdynamic, with overall EF > 65%. There is septal flattening, indicative of pacing. _ Right Ventricle: The right ventricular function appears normal, unchanged from pre-bypass images. _ Aortic Valve: The aortic valve function appears normal, unchanged from pre-bypass. _ Mitral Valve: With initial separation from CPB, there was moderate mitral regurgitation, however this improved to trivial regurgitation by the end of the case. _ Tricuspid Valve: The tricuspid valve function appears unchanged from pre-bypass. There is trivial TR. _ Pulmonic Valve: The pulmonic valve appears unchanged from pre-bypass images, with mild-moderate insufficiency.  PRE-OP FINDINGS Left Ventricle: The left ventricle has hyperdynamic systolic function, with an ejection fraction of >65%, measured 78%. The cavity size was normal. No evidence of left ventricular regional wall motion abnormalities. There is no left ventricular hypertrophy. Left ventricular diastolic function was not evaluated.  Right Ventricle: The right ventricle has normal systolic function. The cavity was normal. There is no increase in right ventricular wall thickness. Catheter present in the right  ventricle.  Left Atrium: Left atrial size was normal in size. No left atrial/left atrial appendage thrombus was detected. Left atrial appendage velocity is reduced at less than 40 cm/s.  Right Atrium: Right atrial size was dilated. Catheter present in the right atrium.  Interatrial Septum: No atrial level shunt detected by color flow Doppler. There is no evidence of a patent foramen ovale.  Pericardium: There is no evidence of pericardial effusion.  Mitral Valve: The mitral valve is normal in structure. Mitral valve regurgitation is mild-moderate by color flow Doppler. There is no evidence of mitral valve vegetation. Pulmonary venous flow is normal. There is no evidence of mitral stenosis, woth mean gradient 1 mmHg, peak gradient 4 mmHg.  Tricuspid Valve: The tricuspid valve was normal in structure. Tricuspid valve regurgitation is trivial by color flow Doppler. No evidence of tricuspid stenosis is present. There is no evidence of tricuspid valve vegetation.  Aortic Valve: The aortic valve is tricuspid. Aortic valve regurgitation was not visualized by color flow Doppler. There is no stenosis of the aortic valve. There is no evidence of aortic valve vegetation. There is mild thickening present on the aortic valve non-coronary, left coronary and right coronary cusps with normal mobility.  Pulmonic Valve: The pulmonic valve was normal in structure, with normal leaflet excursion. No evidence of pulmonic stenosis. Pulmonic valve regurgitation is moderate by color flow Doppler.   Aorta: The aortic root, ascending aorta and aortic arch are normal in size and structure.  Pulmonary Artery:  Theone Murdoch catheter present on the right. The pulmonary artery is moderately dilated.  Venous: The inferior vena cava is dilated in size with less than 50% respiratory variability, suggesting right atrial pressure of 15 mmHg.  Shunts: There is no evidence of an atrial septal  defect.  +--------------+-------++ LEFT VENTRICLE        +--------------+-------++ PLAX 2D               +--------------+-------++ LVIDd:        4.25 cm +--------------+-------++ LVIDs:        2.25 cm +--------------+-------++ LV PW:        0.55 cm +--------------+-------++ LV SV:        64 ml   +--------------+-------++ LV SV Index:  34.30   +--------------+-------++                       +--------------+-------++  +-------------+-----------++ AORTIC VALVE             +-------------+-----------++ AV Vmax:     118.00 cm/s +-------------+-----------++ AV Vmean:    75.400 cm/s +-------------+-----------++ AV VTI:      0.258 m     +-------------+-----------++ AV Peak Grad:5.6 mmHg    +-------------+-----------++ AV Mean Grad:2.5 mmHg    +-------------+-----------++  +-------------+---------++ MITRAL VALVE           +-------------+---------++ MV Peak grad:3.8 mmHg  +-------------+---------++ MV Mean grad:1.0 mmHg  +-------------+---------++ MV Vmax:     0.98 m/s  +-------------+---------++ MV Vmean:    50.5 cm/s +-------------+---------++ MV VTI:      0.24 m    +-------------+---------++   Jairo Ben MD Electronically signed by Jairo Ben MD Signature Date/Time: 03/09/2023/3:46:10 PM    Final        ______________________________________________________________________________________________           Recent Labs: 03/10/2023: Magnesium 2.5 04/13/2023: B Natriuretic Peptide 223.5 05/13/2023: ALT 14; Hemoglobin 11.6; Platelets 196 07/02/2023: BUN 63; Creat 1.29; Potassium 3.7; Sodium 139  Recent Lipid Panel    Component Value Date/Time   CHOL 95 03/02/2023 0422   CHOL 351 (H) 11/18/2022 1216   TRIG 121 03/02/2023 0422   HDL 39 (L) 03/02/2023 0422   HDL 31 (L) 11/18/2022 1216   CHOLHDL 2.4 03/02/2023 0422   VLDL 24 03/02/2023 0422   LDLCALC 32 03/02/2023  0422   LDLCALC Comment (A) 11/18/2022 1216   LDLDIRECT 137 (H) 03/01/2020 1012   LDLDIRECT 96.0 09/23/2019 0828     Risk Assessment/Calculations:    CHA2DS2-VASc Score = 5   This indicates a 7.2% annual risk of stroke. The patient's score is based upon: CHF History: 0 HTN History: 1 Diabetes History: 1 Stroke History: 0 Vascular Disease History: 1 Age Score: 1 Gender Score: 1             Physical Exam:    VS:  BP 114/68 (BP Location: Left Arm, Patient Position: Sitting, Cuff Size: Normal)   Pulse 94   Ht 5\' 3"  (1.6 m)   Wt 176 lb (79.8 kg)   SpO2 97%   BMI 31.18 kg/m     Wt Readings from Last 3 Encounters:  08/12/23 176 lb (79.8 kg)  07/22/23 174 lb (78.9 kg)  06/04/23 167 lb 3.2 oz (75.8 kg)     GEN:  Well nourished, well developed in no acute distress HEENT: Normal NECK: No JVD; No carotid bruits LYMPHATICS: No lymphadenopathy CARDIAC: RRR, no murmurs, rubs, gallops RESPIRATORY:  Clear to auscultation without rales,  wheezing or rhonchi  ABDOMEN: Soft, non-tender, non-distended MUSCULOSKELETAL:  mild BL edema; No deformity  SKIN: Warm and dry NEUROLOGIC:  Alert and oriented x 3 PSYCHIATRIC:  Normal affect   ASSESSMENT:    1. S/P CABG x 3   2. Persistent atrial fibrillation (HCC)   3. Pacemaker   4. Chronic anticoagulation   5. Hyperlipidemia with target LDL less than 70   6. Pulmonary hypertension (HCC)   7. Chronic diastolic CHF (congestive heart failure) (HCC)    PLAN:    In order of problems listed above:  CAD s/p CABG x 3 on 02/2023 SVG-LAD, SVG-OM, SVG-PDA - Remains on 81 mg aspirin, 40 mg Lipitor -- no chest pain -- continue 25 mg jardiance   Permanent atrial fibrillation Chronic anticoagulation PPM in place - Remains on 5 mg Eliquis twice daily,  -- no longer on 100 mg Toprol or 120 mg Cardizem -- no interrogation - has to be in-clinic -- defer to EP   Hyperlipidemia with LDL goal less than 55 - Given use of SVG to LAD and  insulin-dependent diabetes, would recommend a lower LDL goal -Has been maintained on 40 mg Lipitor, 40 mg fenofibrate 03/02/2023: Cholesterol 95; HDL 39; LDL Cholesterol 32; Triglycerides 121; VLDL 24 Consider drawing and LPA with next labs   Pulmonary hypertension Chronic diastolic heart failure - 20 mg torsemide PRN --She reports gaining a pound a day, it is unclear to me whether this is fluid or calories.  History is a bit difficult.  I will prescribe 40 mg torsemide x 3 days then 20 mg torsemide daily.  She will return with her weight log and medications.   Follow-up in 3 months.            Medication Adjustments/Labs and Tests Ordered: Current medicines are reviewed at length with the patient today.  Concerns regarding medicines are outlined above.  No orders of the defined types were placed in this encounter.  No orders of the defined types were placed in this encounter.   Patient Instructions  Medication Instructions:  Torsemide 40 mg x 3 days, then take 20 mg daily  *If you need a refill on your cardiac medications before your next appointment, please call your pharmacy*   Lab Work: NONE ordered at this time of appointment   Testing/Procedures: NONE ordered at this time of appointment    Follow-Up: At [facility], you and your health needs are our priority.  As part of our continuing mission to provide you with exceptional heart care, we have created designated Provider Care Teams.  These Care Teams include your primary Cardiologist (physician) and Advanced Practice Providers (APPs -  Physician Assistants and Nurse Practitioners) who all work together to provide you with the care you need, when you need it.  We recommend signing up for the patient portal called "MyChart".  Sign up information is provided on this After Visit Summary.  MyChart is used to connect with patients for Virtual Visits (Telemedicine).  Patients are able to view lab/test results,  encounter notes, upcoming appointments, etc.  Non-urgent messages can be sent to your provider as well.   To learn more about what you can do with MyChart, go to [email or web address].    Your next appointment:   3 month(s)  Provider:   Micah Flesher, PA-C        Other Instructions Daily Weight Record It is important to weigh yourself daily. To do this: Make sure you use a reliable scale.  Use the same scale each day. Keep this daily weight chart near your scale. Weigh yourself each morning at the same time after you use the bathroom. Before weighing yourself: Take off your shoes. Make sure you are wearing the same amount of clothing each day. Write down your weight in the spaces on the form. Compare today's weight to yesterday's weight. Bring this form with you to your follow-up visits with your health care provider. Call your health care provider if you have concerns about your weight, including rapid weight gain or loss. Date: ________ Weight: ____________________ Date: ________ Weight: ____________________ Date: ________ Weight: ____________________ Date: ________ Weight: ____________________ Date: ________ Weight: ____________________ Date: ________ Weight: ____________________ Date: ________ Weight: ____________________ Date: ________ Weight: ____________________ Date: ________ Weight: ____________________ Date: ________ Weight: ____________________ Date: ________ Weight: ____________________ Date: ________ Weight: ____________________ Date: ________ Weight: ____________________ Date: ________ Weight: ____________________ Date: ________ Weight: ____________________ Date: ________ Weight: ____________________ Date: ________ Weight: ____________________ Date: ________ Weight: ____________________ Date: ________ Weight: ____________________ Date: ________ Weight: ____________________ Date: ________ Weight: ____________________ Date: ________ Weight: ____________________ Date:  ________ Weight: ____________________ Date: ________ Weight: ____________________ Date: ________ Weight: ____________________ Date: ________ Weight: ____________________ Date: ________ Weight: ____________________ Date: ________ Weight: ____________________ Date: ________ Weight: ____________________ Date: ________ Weight: ____________________ Date: ________ Weight: ____________________ Date: ________ Weight: ____________________ Date: ________ Weight: ____________________ Date: ________ Weight: ____________________ Date: ________ Weight: ____________________ Date: ________ Weight: ____________________ Date: ________ Weight: ____________________ Date: ________ Weight: ____________________ Date: ________ Weight: ____________________ Date: ________ Weight: ____________________ Date: ________ Weight: ____________________ Date: ________ Weight: ____________________ Date: ________ Weight: ____________________ Date: ________ Weight: ____________________ Date: ________ Weight: ____________________ Date: ________ Weight: ____________________ Date: ________ Weight: ____________________ Date: ________ Weight: ____________________ Date: ________ Weight: ____________________ Date: ________ Weight: ____________________ This information is not intended to replace advice given to you by your health care provider. Make sure you discuss any questions you have with your health care provider. Document Revised: 02/05/2021 Document Reviewed: 02/05/2021 Elsevier Patient Education  [address]       Signed, Roe Rutherford N[place], [place]  08/12/2023 3:56 PM    [facility]

## 2023-08-11 NOTE — Telephone Encounter (Signed)
 I have scheduled her for next week.

## 2023-08-11 NOTE — Telephone Encounter (Signed)
 Patient scheduled for pump training.  She was told to stop her Lantus insulin Monday night, bring her Humalog, and charge her PDM and bring her phone.

## 2023-08-12 ENCOUNTER — Encounter: Payer: Self-pay | Admitting: Physician Assistant

## 2023-08-12 ENCOUNTER — Ambulatory Visit: Payer: Medicare HMO | Attending: Physician Assistant | Admitting: Physician Assistant

## 2023-08-12 VITALS — BP 114/68 | HR 94 | Ht 63.0 in | Wt 176.0 lb

## 2023-08-12 DIAGNOSIS — Z950 Presence of cardiac pacemaker: Secondary | ICD-10-CM | POA: Diagnosis not present

## 2023-08-12 DIAGNOSIS — Z7901 Long term (current) use of anticoagulants: Secondary | ICD-10-CM | POA: Diagnosis not present

## 2023-08-12 DIAGNOSIS — E785 Hyperlipidemia, unspecified: Secondary | ICD-10-CM | POA: Diagnosis not present

## 2023-08-12 DIAGNOSIS — I4819 Other persistent atrial fibrillation: Secondary | ICD-10-CM | POA: Diagnosis not present

## 2023-08-12 DIAGNOSIS — I5032 Chronic diastolic (congestive) heart failure: Secondary | ICD-10-CM

## 2023-08-12 DIAGNOSIS — Z951 Presence of aortocoronary bypass graft: Secondary | ICD-10-CM | POA: Diagnosis not present

## 2023-08-12 DIAGNOSIS — I272 Pulmonary hypertension, unspecified: Secondary | ICD-10-CM

## 2023-08-12 NOTE — Patient Instructions (Addendum)
 Medication Instructions:  Torsemide 40 mg x 3 days, then take 20 mg daily  *If you need a refill on your cardiac medications before your next appointment, please call your pharmacy*   Lab Work: NONE ordered at this time of appointment   Testing/Procedures: NONE ordered at this time of appointment    Follow-Up: At [facility], you and your health needs are our priority.  As part of our continuing mission to provide you with exceptional heart care, we have created designated Provider Care Teams.  These Care Teams include your primary Cardiologist (physician) and Advanced Practice Providers (APPs -  Physician Assistants and Nurse Practitioners) who all work together to provide you with the care you need, when you need it.  We recommend signing up for the patient portal called "MyChart".  Sign up information is provided on this After Visit Summary.  MyChart is used to connect with patients for Virtual Visits (Telemedicine).  Patients are able to view lab/test results, encounter notes, upcoming appointments, etc.  Non-urgent messages can be sent to your provider as well.   To learn more about what you can do with MyChart, go to [email or web address].    Your next appointment:   3 month(s)  Provider:   Micah Flesher, PA-C        Other Instruction Please Bring Medications and weight log to your next appointment Daily Weight Record It is important to weigh yourself daily. To do this: Make sure you use a reliable scale. Use the same scale each day. Keep this daily weight chart near your scale. Weigh yourself each morning at the same time after you use the bathroom. Before weighing yourself: Take off your shoes. Make sure you are wearing the same amount of clothing each day. Write down your weight in the spaces on the form. Compare today's weight to yesterday's weight. Bring this form with you to your follow-up visits with your health care provider. Call your health care  provider if you have concerns about your weight, including rapid weight gain or loss. Date: ________ Weight: ____________________ Date: ________ Weight: ____________________ Date: ________ Weight: ____________________ Date: ________ Weight: ____________________ Date: ________ Weight: ____________________ Date: ________ Weight: ____________________ Date: ________ Weight: ____________________ Date: ________ Weight: ____________________ Date: ________ Weight: ____________________ Date: ________ Weight: ____________________ Date: ________ Weight: ____________________ Date: ________ Weight: ____________________ Date: ________ Weight: ____________________ Date: ________ Weight: ____________________ Date: ________ Weight: ____________________ Date: ________ Weight: ____________________ Date: ________ Weight: ____________________ Date: ________ Weight: ____________________ Date: ________ Weight: ____________________ Date: ________ Weight: ____________________ Date: ________ Weight: ____________________ Date: ________ Weight: ____________________ Date: ________ Weight: ____________________ Date: ________ Weight: ____________________ Date: ________ Weight: ____________________ Date: ________ Weight: ____________________ Date: ________ Weight: ____________________ Date: ________ Weight: ____________________ Date: ________ Weight: ____________________ Date: ________ Weight: ____________________ Date: ________ Weight: ____________________ Date: ________ Weight: ____________________ Date: ________ Weight: ____________________ Date: ________ Weight: ____________________ Date: ________ Weight: ____________________ Date: ________ Weight: ____________________ Date: ________ Weight: ____________________ Date: ________ Weight: ____________________ Date: ________ Weight: ____________________ Date: ________ Weight: ____________________ Date: ________ Weight: ____________________ Date: ________ Weight:  ____________________ Date: ________ Weight: ____________________ Date: ________ Weight: ____________________ Date: ________ Weight: ____________________ Date: ________ Weight: ____________________ Date: ________ Weight: ____________________ Date: ________ Weight: ____________________ Date: ________ Weight: ____________________ Date: ________ Weight: ____________________ This information is not intended to replace advice given to you by your health care provider. Make sure you discuss any questions you have with your health care provider. Document Revised: 02/05/2021 Document Reviewed: 02/05/2021 Elsevier Patient Education  2024 [employer].

## 2023-08-17 ENCOUNTER — Other Ambulatory Visit: Payer: Self-pay

## 2023-08-18 ENCOUNTER — Other Ambulatory Visit: Payer: Self-pay

## 2023-08-18 ENCOUNTER — Other Ambulatory Visit (HOSPITAL_COMMUNITY): Payer: Self-pay

## 2023-08-18 ENCOUNTER — Telehealth: Payer: Self-pay

## 2023-08-18 ENCOUNTER — Encounter: Payer: Medicare HMO | Attending: Internal Medicine | Admitting: Nutrition

## 2023-08-18 DIAGNOSIS — Z794 Long term (current) use of insulin: Secondary | ICD-10-CM | POA: Insufficient documentation

## 2023-08-18 DIAGNOSIS — E1165 Type 2 diabetes mellitus with hyperglycemia: Secondary | ICD-10-CM | POA: Insufficient documentation

## 2023-08-18 DIAGNOSIS — E1142 Type 2 diabetes mellitus with diabetic polyneuropathy: Secondary | ICD-10-CM | POA: Insufficient documentation

## 2023-08-18 MED ORDER — OMNIPOD 5 LIBRE2 PLUS G6 PODS MISC
1.0000 | 2 refills | Status: DC
  Filled 2023-08-18: qty 30, 10d supply, fill #0
  Filled 2023-09-30: qty 30, 90d supply, fill #0

## 2023-08-18 MED ORDER — FREESTYLE LIBRE 2 PLUS SENSOR MISC
1.0000 | 6 refills | Status: DC
Start: 2023-08-18 — End: 2023-12-25
  Filled 2023-08-18 (×2): qty 2, 28d supply, fill #0
  Filled 2023-10-07: qty 2, 28d supply, fill #1
  Filled 2023-11-13: qty 2, 28d supply, fill #2
  Filled 2023-12-08: qty 2, 28d supply, fill #3

## 2023-08-18 NOTE — Progress Notes (Signed)
 Patient is here today to start her OmniPod 5 insulin pump.  She has brought a Dexcom G7 sensor with the reader.  Her phone is not able to download the G7 app, or the [employer].  Note to [facility] to order the OmniPod 5 for use with the LIbre 2Plus sensors, and the sensors to the [employer].  Pt. To call me when they come in.   We set up a OmniPod account with user name and password ([email or web address]   [email or web address] and linked this to [facility].  We also discussed how this pump will work to deliver the insulin, once she starts on this.  She had no questions for me at this time and will call me to schedule pump training when pods and sensors are available to her She was started on the Dexcom G7 sensor today and linked this to her reader before leaving today.  Blood sugar was 211 acB

## 2023-08-18 NOTE — Patient Instructions (Signed)
 Call when you pick up the Libre 2Plus sensor and the OmniPods that say for use with [facility] 2 sensors. [phone] to schedule training

## 2023-08-21 ENCOUNTER — Telehealth: Payer: Self-pay | Admitting: Dietician

## 2023-08-21 NOTE — Telephone Encounter (Signed)
 Returned patient call and she was not available. She needs training on the Anderson and Omnipod. Left message for her to call our office to reschedule.  Oran Rein, RD, LDN, CDCES, DipACLM

## 2023-08-21 NOTE — Telephone Encounter (Signed)
 Returned patient call. Patient scheduled for pump training and LIbre 2+ on 4/8.   We have pump training orders and referral. Will request Humalog in a vial.  Oran Rein, RD, LDN, CDCES, DipACLM

## 2023-08-24 DIAGNOSIS — H02833 Dermatochalasis of right eye, unspecified eyelid: Secondary | ICD-10-CM | POA: Diagnosis not present

## 2023-08-24 DIAGNOSIS — H02836 Dermatochalasis of left eye, unspecified eyelid: Secondary | ICD-10-CM | POA: Diagnosis not present

## 2023-08-24 DIAGNOSIS — E113511 Type 2 diabetes mellitus with proliferative diabetic retinopathy with macular edema, right eye: Secondary | ICD-10-CM | POA: Diagnosis not present

## 2023-08-24 DIAGNOSIS — H35033 Hypertensive retinopathy, bilateral: Secondary | ICD-10-CM | POA: Diagnosis not present

## 2023-08-24 LAB — HM DIABETES EYE EXAM

## 2023-08-25 NOTE — Telephone Encounter (Signed)
 Opened in error

## 2023-08-31 ENCOUNTER — Other Ambulatory Visit (HOSPITAL_COMMUNITY): Payer: Self-pay

## 2023-08-31 ENCOUNTER — Other Ambulatory Visit: Payer: Self-pay

## 2023-09-04 ENCOUNTER — Other Ambulatory Visit (HOSPITAL_COMMUNITY): Payer: Self-pay

## 2023-09-11 NOTE — Telephone Encounter (Signed)
 done

## 2023-09-18 ENCOUNTER — Other Ambulatory Visit: Payer: Self-pay

## 2023-09-18 ENCOUNTER — Other Ambulatory Visit (HOSPITAL_COMMUNITY): Payer: Self-pay

## 2023-09-21 DIAGNOSIS — E113511 Type 2 diabetes mellitus with proliferative diabetic retinopathy with macular edema, right eye: Secondary | ICD-10-CM | POA: Diagnosis not present

## 2023-09-22 ENCOUNTER — Encounter: Payer: Self-pay | Admitting: Internal Medicine

## 2023-09-22 ENCOUNTER — Ambulatory Visit (INDEPENDENT_AMBULATORY_CARE_PROVIDER_SITE_OTHER): Payer: Medicare HMO | Admitting: Internal Medicine

## 2023-09-22 ENCOUNTER — Other Ambulatory Visit: Payer: Self-pay

## 2023-09-22 ENCOUNTER — Other Ambulatory Visit (HOSPITAL_COMMUNITY): Payer: Self-pay

## 2023-09-22 ENCOUNTER — Other Ambulatory Visit: Payer: Self-pay | Admitting: Internal Medicine

## 2023-09-22 ENCOUNTER — Telehealth: Payer: Self-pay | Admitting: Nutrition

## 2023-09-22 ENCOUNTER — Encounter: Attending: Internal Medicine | Admitting: Nutrition

## 2023-09-22 VITALS — BP 118/74 | HR 83 | Ht 63.0 in | Wt 177.0 lb

## 2023-09-22 DIAGNOSIS — E1159 Type 2 diabetes mellitus with other circulatory complications: Secondary | ICD-10-CM

## 2023-09-22 DIAGNOSIS — Z794 Long term (current) use of insulin: Secondary | ICD-10-CM

## 2023-09-22 DIAGNOSIS — E1165 Type 2 diabetes mellitus with hyperglycemia: Secondary | ICD-10-CM | POA: Insufficient documentation

## 2023-09-22 DIAGNOSIS — E1142 Type 2 diabetes mellitus with diabetic polyneuropathy: Secondary | ICD-10-CM

## 2023-09-22 LAB — POCT GLYCOSYLATED HEMOGLOBIN (HGB A1C): Hemoglobin A1C: 7.5 % — AB (ref 4.0–5.6)

## 2023-09-22 MED ORDER — LANTUS SOLOSTAR 100 UNIT/ML ~~LOC~~ SOPN: 16.0000 [IU] | PEN_INJECTOR | Freq: Every day | SUBCUTANEOUS | Status: DC

## 2023-09-22 MED ORDER — TIRZEPATIDE 7.5 MG/0.5ML ~~LOC~~ SOAJ
7.5000 mg | SUBCUTANEOUS | 3 refills | Status: DC
  Filled 2023-09-22: qty 6, 84d supply, fill #0
  Filled 2023-09-22: qty 2, 28d supply, fill #0
  Filled 2023-10-19: qty 2, 28d supply, fill #1
  Filled 2023-11-12 – 2023-11-13 (×2): qty 2, 28d supply, fill #2
  Filled 2023-12-08: qty 2, 28d supply, fill #3

## 2023-09-22 MED ORDER — TIRZEPATIDE 5 MG/0.5ML ~~LOC~~ SOAJ
5.0000 mg | SUBCUTANEOUS | 1 refills | Status: DC
  Filled 2023-09-22: qty 6, 84d supply, fill #0

## 2023-09-22 MED ORDER — INSULIN LISPRO (1 UNIT DIAL) 100 UNIT/ML (KWIKPEN): 6.0000 [IU] | PEN_INJECTOR | Freq: Three times a day (TID) | SUBCUTANEOUS | Status: DC

## 2023-09-22 NOTE — Patient Instructions (Signed)
 Return next Tuesday and bring your insulin pump and insulin with you.

## 2023-09-22 NOTE — Patient Instructions (Signed)
-   Increase Mounjaro 7.5 mg weekly - Continue Jardiance 25 mg daily  - Decrease  Lantus 16 units daily  - Continue  Humalog 6 units with EACH meal    HOW TO TREAT LOW BLOOD SUGARS (Blood sugar LESS THAN 70 MG/DL) Please follow the RULE OF 15 for the treatment of hypoglycemia treatment (when your (blood sugars are less than 70 mg/dL)   STEP 1: Take 15 grams of carbohydrates when your blood sugar is low, which includes:  3-4 GLUCOSE TABS  OR 3-4 OZ OF JUICE OR REGULAR SODA OR ONE TUBE OF GLUCOSE GEL    STEP 2: RECHECK blood sugar in 15 MINUTES STEP 3: If your blood sugar is still low at the 15 minute recheck --> then, go back to STEP 1 and treat AGAIN with another 15 grams of carbohydrates.

## 2023-09-22 NOTE — Progress Notes (Signed)
 Name: Johniece Hornbaker  Age/ Sex: 68 y.o., female   MRN/ DOB: [id number], [birth year]     PCP: Marcine Matar, MD   Reason for Endocrinology Evaluation: Type 2 Diabetes Mellitus  Initial Endocrine Consultative Visit: 09/26/2019    PATIENT IDENTIFIER: Ms. Mylena Sedberry is a 68 y.o. female with a past medical history of DM, HTN, A.Fib , S/P pacemaker, CAD(S/P CABG 2024)   and dyslipidemia. The patient has followed with Endocrinology clinic since 09/26/2019 for consultative assistance with management of her diabetes.  DIABETIC HISTORY:  Ms. Wickham was diagnosed with DM in 2013, has been on insulin and metformin since diagnosis. Her hemoglobin A1c has ranged from 9.5% in 2020, peaking at 12.2% in 2019    On initial visit to our clinic, her A1c was 12.4% . She was on Lantus and metformin and we added Novolog    Stopped metformin 05/2021 and started Jardiance  Trulicity caused diarrhea   Lives with sister    She was trained on Dexcom 09/2022  Discontinue pioglitazone 06/2023 due to lower extremity edema   She received training for the OmniPod 08/2023  SUBJECTIVE:   During the last visit (07/22/2023): A1c 9.1%   Today (09/22/2023): Ms. Gradel is here for a follow up on diabetes management.  She checks her blood sugars 2-3 time a day . The patient has not had hypoglycemic episodes since the last clinic visit.    She continues to follow-up with cardiology for CAD, s/p CABG x 3 02/2023.  As well as s/p ablation in 2024  Continues with occasional LE edema  Denies nausea or vomiting  Denies constipation or diarrhea      HOME DIABETES REGIMEN:  Jardiance 25 mg daily Mounjaro 5 mg weekly Lantus 20 units daily Humalog 8 unitsTIDQAC Correction scale : HUmalog (BG -130/30)     Statin: Yes ACE-I/ARB: no  METER DOWNLOAD SUMMARY: Unable to download 141 this am   Range 89 - 253 mg/dL   CONTINUOUS GLUCOSE MONITORING RECORD INTERPRETATION    Dates of Recording:  3/26-/01/2024  Sensor description: Dexcom  Results statistics:   CGM use % of time 95  Average and SD 165/51  Time in range 63     %  % Time Above 180 29  % Time above 250 7  % Time Below target 1   Glycemic patterns summary: BGs are optimal overnight and fluctuate during the day  Hyperglycemic episodes postprandial  Hypoglycemic episodes occurred after bolus  Overnight periods: Mostly optimal    DIABETIC COMPLICATIONS: Microvascular complications:  Fluctuating GFR, cataract, neuropathy  Last eye exam: Completed 10/22/2022 Macrovascular complications:    Denies: CAD, PVD, CVA      HISTORY:  Past Medical History:  Past Medical History:  Diagnosis Date   Arthritis    Atrial fibrillation (HCC)    a. 09/2016 in setting of pancreatitis;  b. 09/2016 Echo: EF 65-60%, no rwma, Gr2 DD, mild MR, triv TR, PASP ;  CHA2DS2VASc = 4-->Eliquis 5mg  BID. c. Recurred 06/2018 in setting of GIB.   Chronic diastolic CHF (congestive heart failure) (HCC) 01/28/2018   Diabetes mellitus    GI bleeding 06/2018   Gout    Hyperlipidemia    Hypertension    Hypertriglyceridemia    Pancreatitis    a. 09/2016 - Triglycerides 1,392 on admission.   Past Surgical History:  Past Surgical History:  Procedure Laterality Date   AV NODE ABLATION N/A 01/02/2023   Procedure: AV NODE ABLATION;  Surgeon: Ladona Ridgel,  Doylene Canning, MD;  Location: [facility] INVASIVE CV LAB;  Service: Cardiovascular;  Laterality: N/A;   BIOPSY  06/20/2018   Procedure: BIOPSY;  Surgeon: Vida Rigger, MD;  Location[facility] ENDOSCOPY;  Service: Endoscopy;;   CHOLECYSTECTOMY N/A 01/04/2014   Procedure: LAPAROSCOPIC CHOLECYSTECTOMY WITH INTRAOPERATIVE CHOLANGIOGRAM;  Surgeon: Axel Filler, MD;  Location: [facility] OR;  Service: General;  Laterality: N/A;   COLONOSCOPY WITH PROPOFOL N/A 06/21/2018   Procedure: COLONOSCOPY WITH PROPOFOL;  Surgeon: Bernette Redbird, MD;  Location: [facility] ENDOSCOPY;  Service: Endoscopy;  Laterality: N/A;   CORONARY ARTERY BYPASS  GRAFT N/A 03/09/2023   Procedure: CORONARY ARTERY BYPASS GRAFTING (CABG) TIMES THREE USING THE LEFT INTERNAL MAMMARY ARTERY (LIMA) AND ENDOSCOPICALLY HARVESTED RIGHT GREATER SAPHENOUS VEIN;  Surgeon: [name], MD;  Location: [facility] OR;  Service: Open Heart Surgery;  Laterality: N/A;   ESOPHAGOGASTRODUODENOSCOPY (EGD) WITH PROPOFOL N/A 06/20/2018   Procedure: ESOPHAGOGASTRODUODENOSCOPY (EGD) WITH PROPOFOL;  Surgeon: Vida Rigger, MD;  Location: [facility] ENDOSCOPY;  Service: Endoscopy;  Laterality: N/A;   IR THORACENTESIS ASP PLEURAL SPACE W/IMG GUIDE  03/16/2023   LUNG SURGERY     PACEMAKER IMPLANT N/A 03/03/2022   Procedure: PACEMAKER IMPLANT;  Surgeon: Marinus Maw, MD;  Location: [facility] INVASIVE CV LAB;  Service: Cardiovascular;  Laterality: N/A;   RIGHT HEART CATH N/A 03/06/2023   Procedure: RIGHT HEART CATH;  Surgeon: Laurey Morale, MD;  Location: [facility] INVASIVE CV LAB;  Service: Cardiovascular;  Laterality: N/A;   RIGHT/LEFT HEART CATH AND CORONARY ANGIOGRAPHY N/A 03/03/2023   Procedure: RIGHT/LEFT HEART CATH AND CORONARY ANGIOGRAPHY;  Surgeon: Marykay Lex, MD;  Location: [facility] INVASIVE CV LAB;  Service: Cardiovascular;  Laterality: N/A;   TEE WITHOUT CARDIOVERSION N/A 03/09/2023   Procedure: TRANSESOPHAGEAL ECHOCARDIOGRAM;  Surgeon: [name], MD;  Location: [facility] OR;  Service: Open Heart Surgery;  Laterality: N/A;   Social History:  reports that she has never smoked. She has never used smokeless tobacco. She reports that she does not drink alcohol and does not use drugs. Family History:  Family History  Problem Relation Age of Onset   Heart attack Mother    Heart attack Father    Diabetes Sister    High blood pressure Neg Hx    High Cholesterol Neg Hx      HOME MEDICATIONS: Allergies as of 09/22/2023       Reactions   Trulicity [dulaglutide] Diarrhea        Medication List        Accurate as of September 22, 2023  9:34 AM. If you have any questions, ask your nurse or doctor.           Accu-Chek Guide Me w/Device Kit Use to check blood sugar 3 times daily.   Accu-Chek Guide Test test strip Generic drug: glucose blood Use as instructed   Accu-Chek Softclix Lancets lancets Use to check blood sugar 3 times daily.   acetaminophen 325 MG tablet Commonly known as: TYLENOL Take 2 tablets (650 mg total) by mouth every 6 (six) hours as needed for mild pain (or Fever >/= 101).   aspirin EC 81 MG tablet Take 1 tablet (81 mg total) by mouth daily. Swallow whole.   atorvastatin 40 MG tablet Commonly known as: LIPITOR Take 1 tablet (40 mg total) by mouth daily.   Blood Pressure Monitor/Arm Devi Check blood pressure once per day at least 2 hours after medications.   CENTRUM SILVER PO Take 1 tablet by mouth daily.   Ocuvite Adult 50+  Caps Take 1 capsule by mouth daily.   Dexcom G7 Sensor Misc 1 Device by Does not apply route as directed.   FreeStyle Libre 2 Plus Sensor Misc Use as directred to test blood sugar. Change sensor every 14 days .   diclofenac Sodium 1 % Gel Commonly known as: Voltaren Apply 2 g topically 4 (four) times daily. Apply across lower back for back pain What changed:  when to take this reasons to take this   Eliquis 5 MG Tabs tablet Generic drug: apixaban Take 1 tablet (5 mg total) by mouth in the morning and at bedtime.   fenofibrate 48 MG tablet Commonly known as: Tricor Take 1 tablet (48 mg total) by mouth daily.   FeroSul 325 (65 Fe) MG tablet Generic drug: ferrous sulfate Take 1 tablet (325 mg total) by mouth daily with breakfast.   HumaLOG KwikPen 100 UNIT/ML KwikPen Generic drug: insulin lispro Max daily 45 units   Insulin Pen Needle 32G X 4 MM Misc 1 Device by Does not apply route in the morning, at noon, in the evening, and at bedtime.   Insulin Syringe-Needle U-100 31G X 5/16" 0.3 ML Misc Commonly known as: TRUEplus Insulin Syringe Use to inject insulin daily. What changed:  how much to take how to take  this when to take this   Jardiance 25 MG Tabs tablet Generic drug: empagliflozin Take 1 tablet (25 mg total) by mouth daily before breakfast.   Lantus SoloStar 100 UNIT/ML Solostar Pen Generic drug: insulin glargine Inject 20 Units into the skin daily.   lidocaine 5 % Commonly known as: Lidoderm Place 1 patch onto the skin daily. Remove & Discard patch within 12 hours or as directed by MD   Omnipod 5 DexG7G6 Intro Gen 5 Kit Change pod every 3 days   Omnipod 5 DexG7G6 Pods Gen 5 Misc Change pods every 3 days   Omnipod 5 DexG7G6 Intro Gen 5 Kit Change pod every 3 days   Omnipod 5 DexG7G6 Pods Gen 5 Misc Change pod every 3 days   Omnipod 5 Libre2 Plus G6 Pods Misc 1 each by Does not apply route every 3 (three) days.   tirzepatide 5 MG/0.5ML Pen Commonly known as: MOUNJARO Inject 5 mg into the skin once a week.   torsemide 20 MG tablet Commonly known as: DEMADEX Take 1 tablet (20 mg) once a day, Take extra dose of 20 mg as needed for sudden weight gain and leg swelling What changed:  how much to take how to take this when to take this reasons to take this additional instructions         OBJECTIVE:   Vital Signs: BP 118/74 (BP Location: Left Arm, Patient Position: Sitting, Cuff Size: Small)   Pulse 83   Ht 5\' 3"  (1.6 m)   Wt 177 lb (80.3 kg)   SpO2 98%   BMI 31.35 kg/m   Wt Readings from Last 3 Encounters:  09/22/23 177 lb (80.3 kg)  08/12/23 176 lb (79.8 kg)  07/22/23 174 lb (78.9 kg)     Exam: General: Pt appears well and is in NAD  Lungs: Clear with good BS bilat   Heart: RRR  Extremities: No pretibial edema.   Neuro: MS is good with appropriate affect, pt is alert and Ox3   DM foot exam: 06/24/2023   The skin of the feet is intact without sores or ulcerations,with thickened yellow toe nail The pedal pulses are undetectable  The sensation is decreased  to  a screening 5.07, 10 gram monofilament bilaterally  DATA REVIEWED:  Lab Results   Component Value Date   HGBA1C 9.1 (A) 06/24/2023   HGBA1C 8.0 (H) 03/04/2023   HGBA1C 7.8 (A) 01/13/2023    Latest Reference Range & Units 05/13/23 21:10  Sodium 135 - 145 mmol/L 138  Potassium 3.5 - 5.1 mmol/L 3.9  Chloride 98 - 111 mmol/L 106  CO2 22 - 32 mmol/L 19 (L)  Glucose 70 - 99 mg/dL 604 (H)  BUN 8 - 23 mg/dL 57 (H)  Creatinine 5.40 - 1.00 mg/dL 9.81 (H)  Calcium 8.9 - 10.3 mg/dL 9.2  Anion gap 5 - 15  13  Alkaline Phosphatase 38 - 126 U/L 44  Albumin 3.5 - 5.0 g/dL 3.6  Lipase 11 - 51 U/L 83 (H)  AST 15 - 41 U/L 23  ALT 0 - 44 U/L 14  Total Protein 6.5 - 8.1 g/dL 7.0  Total Bilirubin <1.9 mg/dL 0.4  GFR, Estimated >14 mL/min 39 (L)     ASSESSMENT / PLAN / RECOMMENDATIONS:   1) Type 2 Diabetes Mellitus, with improving glycemic control, With neuropathic and macrovascular complications - Most recent A1c of 7.5 %   -A1c has trended down tremendously -She is intolerant to Trulicity  -Intolerant to pioglitazone due to lower extremity edema -She did not qualify for the bionic iLet pump, she is going to get trained on the OmniPod -We switch Victoza to [employer] due to insurance requirements, she is tolerating it well, will increase Mounjaro as below -I will decrease her basal/prandial insulin due to hypoglycemia   MEDICATIONS:  -Continue Jardiace 25 mg daily  -Decrease Lantus 16 units daily  -Decrease Humalog 6 units with EACH meal  -Increase Mounjaro 7.5 mg weekly    EDUCATION / INSTRUCTIONS: BG monitoring instructions: Patient is instructed to check her blood sugars 3 times a day, before meals. I reviewed the Rule of 15 for the treatment of hypoglycemia in detail with the patient. Literature supplied.    2) Diabetic complications:  Eye: Does not have known diabetic retinopathy.  Neuro/ Feet: Does have known diabetic peripheral neuropathy. Renal: Patient does have known baseline CKD. She is not on an ACEI/ARB at present.   3) Dyslipidemia :  -Patient  on atorvastatin 40 mg daily   F/U in 3 months   Signed electronically by: Lyndle Herrlich, MD  [facility] Endocrinology  [facility] [address] [place] [place], [place] [address] Phone: [phone] FAX: [phone]   CC: Marcine Matar, MD [address] [place] [place] [address] Phone: [phone]  Fax: [phone]  Return to Endocrinology clinic as below: Future Appointments  Date Time Provider Department Center  09/22/2023 10:30 AM Percival Glasheen, Konrad Dolores, MD [facility] None  09/22/2023  2:00 PM Jessica Priest, RN [facility]DM  10/06/2023 10:50 AM Marcine Matar, MD [facility] None  11/16/2023 10:30 AM Duke, Roe Rutherford, PA [facility] None

## 2023-09-22 NOTE — Progress Notes (Signed)
 Patient was to start her insulin pump, but forgot the pump today.  She only has her Libre 2 plus sensors and insulin.  She was trained on the use of the [place] 2 plus sensors and one was inserted into her right upper outer arm, and the readings will be going to a reader, which was set with the correct date/time.  She will return next week to begin her pump training.

## 2023-09-28 DIAGNOSIS — E113512 Type 2 diabetes mellitus with proliferative diabetic retinopathy with macular edema, left eye: Secondary | ICD-10-CM | POA: Diagnosis not present

## 2023-09-29 ENCOUNTER — Encounter: Admitting: Nutrition

## 2023-09-29 ENCOUNTER — Telehealth: Payer: Self-pay | Admitting: Nutrition

## 2023-09-29 NOTE — Telephone Encounter (Signed)
 Patient came in today to start her insulin pump, but forgot her PDM.  Appt. Was rescheduled

## 2023-09-29 NOTE — Telephone Encounter (Signed)
 Opened in error

## 2023-09-30 ENCOUNTER — Other Ambulatory Visit (HOSPITAL_COMMUNITY): Payer: Self-pay

## 2023-09-30 ENCOUNTER — Encounter: Admitting: Nutrition

## 2023-09-30 ENCOUNTER — Telehealth: Payer: Self-pay

## 2023-09-30 ENCOUNTER — Other Ambulatory Visit: Payer: Self-pay

## 2023-09-30 DIAGNOSIS — Z794 Long term (current) use of insulin: Secondary | ICD-10-CM | POA: Diagnosis not present

## 2023-09-30 DIAGNOSIS — E1165 Type 2 diabetes mellitus with hyperglycemia: Secondary | ICD-10-CM | POA: Diagnosis not present

## 2023-09-30 MED ORDER — OMNIPOD 5 LIBRE2 PLUS G6 KIT
1.0000 | PACK | Freq: Once | 0 refills | Status: AC
Start: 2023-09-30 — End: 2023-10-03
  Filled 2023-09-30 – 2023-10-01 (×3): qty 1, 30d supply, fill #0

## 2023-09-30 MED ORDER — INSULIN LISPRO 100 UNIT/ML IJ SOLN
INTRAMUSCULAR | 3 refills | Status: AC
Start: 2023-09-30 — End: ?
  Filled 2023-09-30: qty 30, 83d supply, fill #0
  Filled 2023-11-12 – 2023-12-08 (×3): qty 30, 83d supply, fill #1
  Filled 2024-04-04 – 2024-04-06 (×2): qty 30, 83d supply, fill #2
  Filled 2024-06-06: qty 30, 83d supply, fill #3

## 2023-09-30 NOTE — Patient Instructions (Signed)
 Call when starter kit and pods come for use with the [facility] 2Plus sensor Bring Libre 2Plus sensors with you as well

## 2023-09-30 NOTE — Telephone Encounter (Signed)
 Humalog request refill for vial vs Kwikpen ordered for purpose of starting Omnipod. Correct Omnipod (with libre 2 plus) supplies were ordered, the initial order was incorrect per Stana Ear.

## 2023-09-30 NOTE — Progress Notes (Signed)
 Patient came in today to start her OmniPod 5.  She is wearing a G7 sensors with readings going to her reader.  She can not download G7 app to her phone, so PDM kit of G7 will not work for her.  She reports having picked up the [place] 2 Plus (which can be used, but not with this starter kit), but did not bring them in.  The Pharmacy was called and it appears the order for the Libre2 plus starter kit was not called in.  The order was sent in along with insulin in a vial, and pods for this kit.  The pharmacist said she will be able to order these and the patient will call me when she gets the new starter kit and pods, and insulin.

## 2023-10-01 ENCOUNTER — Other Ambulatory Visit: Payer: Self-pay

## 2023-10-01 ENCOUNTER — Other Ambulatory Visit (HOSPITAL_COMMUNITY): Payer: Self-pay

## 2023-10-06 ENCOUNTER — Ambulatory Visit: Payer: Medicare HMO | Attending: Internal Medicine | Admitting: Internal Medicine

## 2023-10-06 ENCOUNTER — Other Ambulatory Visit (HOSPITAL_COMMUNITY): Payer: Self-pay

## 2023-10-06 ENCOUNTER — Encounter: Payer: Self-pay | Admitting: Internal Medicine

## 2023-10-06 ENCOUNTER — Other Ambulatory Visit: Payer: Self-pay

## 2023-10-06 VITALS — BP 109/74 | HR 82 | Temp 97.9°F | Ht 63.0 in | Wt 177.0 lb

## 2023-10-06 DIAGNOSIS — I480 Paroxysmal atrial fibrillation: Secondary | ICD-10-CM | POA: Diagnosis not present

## 2023-10-06 DIAGNOSIS — D649 Anemia, unspecified: Secondary | ICD-10-CM

## 2023-10-06 DIAGNOSIS — I509 Heart failure, unspecified: Secondary | ICD-10-CM | POA: Diagnosis not present

## 2023-10-06 DIAGNOSIS — E119 Type 2 diabetes mellitus without complications: Secondary | ICD-10-CM

## 2023-10-06 DIAGNOSIS — Z794 Long term (current) use of insulin: Secondary | ICD-10-CM | POA: Diagnosis not present

## 2023-10-06 DIAGNOSIS — Z7985 Long-term (current) use of injectable non-insulin antidiabetic drugs: Secondary | ICD-10-CM

## 2023-10-06 DIAGNOSIS — E113593 Type 2 diabetes mellitus with proliferative diabetic retinopathy without macular edema, bilateral: Secondary | ICD-10-CM | POA: Diagnosis not present

## 2023-10-06 DIAGNOSIS — E1159 Type 2 diabetes mellitus with other circulatory complications: Secondary | ICD-10-CM | POA: Diagnosis not present

## 2023-10-06 DIAGNOSIS — Z7984 Long term (current) use of oral hypoglycemic drugs: Secondary | ICD-10-CM

## 2023-10-06 DIAGNOSIS — I2510 Atherosclerotic heart disease of native coronary artery without angina pectoris: Secondary | ICD-10-CM

## 2023-10-06 DIAGNOSIS — N1832 Chronic kidney disease, stage 3b: Secondary | ICD-10-CM

## 2023-10-06 DIAGNOSIS — E083513 Diabetes mellitus due to underlying condition with proliferative diabetic retinopathy with macular edema, bilateral: Secondary | ICD-10-CM

## 2023-10-06 DIAGNOSIS — E1165 Type 2 diabetes mellitus with hyperglycemia: Secondary | ICD-10-CM | POA: Diagnosis not present

## 2023-10-06 MED ORDER — APIXABAN 5 MG PO TABS
5.0000 mg | ORAL_TABLET | Freq: Two times a day (BID) | ORAL | 0 refills | Status: DC
Start: 2023-10-06 — End: 2023-11-16
  Filled 2023-10-06 (×2): qty 180, 90d supply, fill #0

## 2023-10-06 MED ORDER — DICLOFENAC SODIUM 1 % EX GEL
2.0000 g | Freq: Four times a day (QID) | CUTANEOUS | 0 refills | Status: AC
  Filled 2023-10-06 (×2): qty 100, 13d supply, fill #0

## 2023-10-06 MED ORDER — LANTUS SOLOSTAR 100 UNIT/ML ~~LOC~~ SOPN: 16.0000 [IU] | PEN_INJECTOR | Freq: Every day | SUBCUTANEOUS | Status: DC

## 2023-10-06 NOTE — Progress Notes (Signed)
 Patient ID: Emily Farmer, female    DOB: 07/29/1955  MRN: [id number]  CC: Hypertension (HTN & DM f/u./Discuss insulin  pump /)   Subjective: Emily Farmer is a 68 y.o. female who presents for chronic ds management. 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  History of Present Illness   The patient, with a history of diabetes, chronic kidney disease, heart disease, and heart failure, presents for a four-month follow-up visit.   DM: She reports a recent decrease in her A1c to 7.5, which she attributes to her current medication regimen, including Mounjaro  7.5mg  weekly, Lantus  insulin  16 units daily, Jardiance  25mg  daily, and Humalog  insulin  6 units with meals.  She has seen her endocrinologist Dr. Hershal Loron earlier this month.  She is due to start Omnipod insulin  pump today. The pump will deliver Humalog  insulin  with meals, but she will continue to take Lantus  insulin  once a day at bedtime. She denies any decrease in appetite or weight loss since starting Mounjaro , which she has been on for almost two months. - She continues to follow with Dr. Seward Dao for diabetic retinopathy and is being treated for such.  CKD 3: She is not on any oral NSAIDs.  Last BUN/creatinine was 63/1.29.  GFR in the 30s.  CAD/CHFrEF/PAF: Reports some intermittent soreness on the lateral side of the left breast since having her mammogram in February.  Notices it when she turns  a certain way.  Mammogram came back okay.  Denies any pain in the center of the chest.  No shortness of breath.  She sleeps on 2 pillows.  She thinks the legs are little swollen.  Saw cardiology PA in February.  Reports compliance with aspirin  81 mg, atorvastatin  40 mg daily, fenofibrate  40 mg daily and torsemide  20 mg daily with option to take an extra dose when she has increased swelling.  She is also taking the Eliquis  for history of PAF.  Denies any bruising or bleeding on the Eliquis .  Chronic anemia: Was on iron previously and thinks she is still taking.  However last prescription was filled and picked up 03/2021.  Patient Active Problem List   Diagnosis Date Noted   Type 2 diabetes mellitus with hyperglycemia, with long-term current use of insulin  (HCC) 06/24/2023   Diabetes mellitus (HCC) 06/24/2023   Permanent atrial fibrillation (HCC) 03/17/2023   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/2023   Coronary artery disease 03/09/2023   Coronary artery disease involving native coronary artery of native heart with unstable angina pectoris (HCC) 03/05/2023   Accelerating angina (HCC) 03/01/2023   Stage 3b chronic kidney disease (HCC) 11/18/2022   Proliferative diabetic retinopathy of both eyes (HCC) 10/29/2022   Peripheral arterial disease (HCC) 10/14/2022   Polyneuropathy associated with underlying disease (HCC) 08/22/2022   Type 2 diabetes mellitus with peripheral neuropathy (HCC) 08/22/2022   Pacemaker 06/03/2022   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   Bacteremia 04/01/2021   Atrial fibrillation with slow ventricular response (HCC) 03/31/2021   Pulmonary hypertension (HCC)  03/19/2021   Hypomagnesemia    Sepsis (HCC) 02/27/2021   Atrial fibrillation with rapid ventricular response (HCC) 02/27/2021   Hypotension 02/27/2021   Secondary hypercoagulable state (HCC) 05/08/2020   Dyslipidemia 09/23/2019   Toenail fungus 07/19/2018   Depression 07/19/2018   Iron deficiency anemia due to chronic  blood loss 07/19/2018   Gastritis and duodenitis 07/19/2018   Chronic a-fib (HCC) 01/28/2018   Hyponatremia 01/28/2018   Chronic diastolic CHF (congestive heart failure) (HCC) 01/28/2018   Mixed hyperlipidemia 06/01/2017   Paroxysmal A-fib (HCC) 10/04/2016   Hypokalemia 01/04/2014   Acute pyelonephritis 02/15/2013   Essential hypertension 02/15/2013   Hypertriglyceridemia 02/15/2013   Coronary atherosclerosis seen on CT 02/15/2013     Current Outpatient Medications on File Prior to Visit  Medication Sig Dispense Refill   Accu-Chek Softclix Lancets lancets Use to check blood sugar 3 times daily. 100 each 3   acetaminophen  (TYLENOL ) 325 MG tablet Take 2 tablets (650 mg total) by mouth every 6 (six) hours as needed for mild pain (or Fever >/= 101).     apixaban  (ELIQUIS ) 5 MG TABS tablet Take 1 tablet (5 mg total) by mouth in the morning and at bedtime. 180 tablet 0   aspirin  EC 81 MG tablet Take 1 tablet (81 mg total) by mouth daily. Swallow whole.     atorvastatin  (LIPITOR) 40 MG tablet Take 1 tablet (40 mg total) by mouth daily. 90 tablet 3   Blood Glucose Monitoring Suppl (ACCU-CHEK GUIDE) w/Device KIT Use to check blood sugar 3 times daily. 1 kit 0   Blood Pressure Monitoring (BLOOD PRESSURE MONITOR/ARM) DEVI Check blood pressure once per day at least 2 hours after medications. 1 each 0   Continuous Glucose Sensor (DEXCOM G7 SENSOR) MISC 1 Device by Does not apply route as directed. 9 each 3   Continuous Glucose Sensor (FREESTYLE LIBRE 2 PLUS SENSOR) MISC Use as directred to test blood sugar. Change sensor every 14 days . 2 each 6   diclofenac  Sodium (VOLTAREN ) 1 % GEL Apply 2 g topically 4 (four) times daily. Apply across lower back for back pain (Patient taking differently: Apply 2 g topically 4 (four) times daily as needed (pain). Apply across lower back for back pain) 100 g 0   empagliflozin  (JARDIANCE ) 25 MG TABS tablet Take 1 tablet (25 mg total) by mouth daily before breakfast. 90  tablet 3   fenofibrate  (TRICOR ) 48 MG tablet Take 1 tablet (48 mg total) by mouth daily. 90 tablet 1   ferrous sulfate  325 (65 FE) MG tablet Take 1 tablet (325 mg total) by mouth daily with breakfast. 30 tablet 4   glucose blood (TRUE METRIX BLOOD GLUCOSE TEST) test strip Use as instructed 100 each 12   Insulin  Disposable Pump (OMNIPOD 5 DEXG7G6 INTRO GEN 5) KIT Change pod every 3 days 1 kit 0   Insulin  Disposable Pump (OMNIPOD 5 DEXG7G6 INTRO GEN 5) KIT Change pod every 3 days 1 kit 0   Insulin  Disposable Pump (OMNIPOD 5 DEXG7G6 PODS GEN 5) MISC Change pods every 3 days 45 each 3   Insulin  Disposable Pump (OMNIPOD 5 DEXG7G6 PODS GEN 5) MISC Change pod every 3 days 30 each 3   Insulin  Disposable Pump (OMNIPOD 5 LIBRE2 PLUS G6 PODS) MISC 1 each by Does not apply route every 3 (three) days. 30 each 2   insulin  glargine (LANTUS  SOLOSTAR) 100 UNIT/ML Solostar Pen Inject 16 Units into the skin daily.     insulin  lispro (HUMALOG  KWIKPEN)  100 UNIT/ML KwikPen Inject 6 Units into the skin 3 (three) times daily. Max daily 45 units     insulin  lispro (HUMALOG ) 100 UNIT/ML injection Inject 36 units per day via insulin  pump 30 mL 3   Insulin  Pen Needle 32G X 4 MM MISC 1 Device by Does not apply route in the morning, at noon, in the evening, and at bedtime. 400 each 3   Insulin  Syringe-Needle U-100 (TRUEPLUS INSULIN  SYRINGE) 31G X 5/16" 0.3 ML MISC Use to inject insulin  daily. (Patient taking differently: 1 each by Other route See admin instructions. Use to inject insulin  daily.) 100 each 11   lidocaine  (LIDODERM ) 5 % Place 1 patch onto the skin daily. Remove & Discard patch within 12 hours or as directed by MD 30 patch 0   Multiple Vitamins-Minerals (CENTRUM SILVER  PO) Take 1 tablet by mouth daily.     Multiple Vitamins-Minerals (OCUVITE ADULT 50+) CAPS Take 1 capsule by mouth daily.     tirzepatide  (MOUNJARO ) 7.5 MG/0.5ML Pen Inject 7.5 mg into the skin once a week. 6 mL 3   torsemide  (DEMADEX ) 20 MG tablet  Take 1 tablet (20 mg) once a day, Take extra dose of 20 mg as needed for sudden weight gain and leg swelling (Patient taking differently: Take 20 mg by mouth daily as needed. Take 40 mg daily for 3 days, then take 20 mg daily) 90 tablet 3   No current facility-administered medications on file prior to visit.    Allergies  Allergen Reactions   Trulicity  [Dulaglutide ] Diarrhea    Social History   Socioeconomic History   Marital status: Single    Spouse name: Not on file   Number of children: Not on file   Years of education: Not on file   Highest education level: Not on file  Occupational History   Not on file  Tobacco Use   Smoking status: Never   Smokeless tobacco: Never  Vaping Use   Vaping status: Never Used  Substance and Sexual Activity   Alcohol  use: No   Drug use: No   Sexual activity: Not Currently  Other Topics Concern   Not on file  Social History Narrative   Lives with her sister and does not need any assistance with ADLs.     Social Drivers of Health   Financial Resource Strain: Medium Risk (05/18/2023)   Overall Financial Resource Strain (CARDIA)    Difficulty of Paying Living Expenses: Somewhat hard  Food Insecurity: No Food Insecurity (06/25/2023)   Hunger Vital Sign    Worried About Running Out of Food in the Last Year: Never true    Ran Out of Food in the Last Year: Never true  Recent Concern: Food Insecurity - Food Insecurity Present (06/05/2023)   Hunger Vital Sign    Worried About Running Out of Food in the Last Year: Sometimes true    Ran Out of Food in the Last Year: Sometimes true  Transportation Needs: No Transportation Needs (06/25/2023)   PRAPARE - Administrator, Civil Service (Medical): No    Lack of Transportation (Non-Medical): No  Recent Concern: Transportation Needs - Unmet Transportation Needs (05/06/2023)   PRAPARE - Transportation    Lack of Transportation (Medical): Yes    Lack of Transportation (Non-Medical): Yes   Physical Activity: Insufficiently Active (05/06/2023)   Exercise Vital Sign    Days of Exercise per Week: 7 days    Minutes of Exercise per Session: 20 min  Stress: No Stress  Concern Present (05/18/2023)   [employer] of Occupational Health - Occupational Stress Questionnaire    Feeling of Stress : Not at all  Recent Concern: Stress - Stress Concern Present (05/06/2023)   [employer] of Occupational Health - Occupational Stress Questionnaire    Feeling of Stress : Very much  Social Connections: Socially Isolated (05/18/2023)   Social Connection and Isolation Panel [NHANES]    Frequency of Communication with Friends and Family: Once a week    Frequency of Social Gatherings with Friends and Family: Once a week    Attends Religious Services: Never    Database administrator or Organizations: No    Attends Banker Meetings: Never    Marital Status: Never married  Intimate Partner Violence: Not At Risk (06/25/2023)   Humiliation, Afraid, Rape, and Kick questionnaire    Fear of Current or Ex-Partner: No    Emotionally Abused: No    Physically Abused: No    Sexually Abused: No    Family History  Problem Relation Age of Onset   Heart attack Mother    Heart attack Father    Diabetes Sister    High blood pressure Neg Hx    High Cholesterol Neg Hx     Past Surgical History:  Procedure Laterality Date   AV NODE ABLATION N/A 01/02/2023   Procedure: AV NODE ABLATION;  Surgeon: Tammie Fall, MD;  Location: [facility] INVASIVE CV LAB;  Service: Cardiovascular;  Laterality: N/A;   BIOPSY  06/20/2018   Procedure: BIOPSY;  Surgeon: Ozell Blunt, MD;  Location: [facility] ENDOSCOPY;  Service: Endoscopy;;   CHOLECYSTECTOMY N/A 01/04/2014   Procedure: LAPAROSCOPIC CHOLECYSTECTOMY WITH INTRAOPERATIVE CHOLANGIOGRAM;  Surgeon: Shela Derby, MD;  Location: [facility] OR;  Service: General;  Laterality: N/A;   COLONOSCOPY WITH PROPOFOL  N/A 06/21/2018   Procedure: COLONOSCOPY WITH PROPOFOL ;  Surgeon:  Lanita Pitman, MD;  Location: [facility] ENDOSCOPY;  Service: Endoscopy;  Laterality: N/A;   CORONARY ARTERY BYPASS GRAFT N/A 03/09/2023   Procedure: CORONARY ARTERY BYPASS GRAFTING (CABG) TIMES THREE USING THE LEFT INTERNAL MAMMARY ARTERY (LIMA) AND ENDOSCOPICALLY HARVESTED RIGHT GREATER SAPHENOUS VEIN;  Surgeon: Bartley Lightning, MD;  Location: [facility] OR;  Service: Open Heart Surgery;  Laterality: N/A;   ESOPHAGOGASTRODUODENOSCOPY (EGD) WITH PROPOFOL  N/A 06/20/2018   Procedure: ESOPHAGOGASTRODUODENOSCOPY (EGD) WITH PROPOFOL ;  Surgeon: Ozell Blunt, MD;  Location[facility] ENDOSCOPY;  Service: Endoscopy;  Laterality: N/A;   IR THORACENTESIS ASP PLEURAL SPACE W/IMG GUIDE  03/16/2023   LUNG SURGERY     PACEMAKER IMPLANT N/A 03/03/2022   Procedure: PACEMAKER IMPLANT;  Surgeon: Tammie Fall, MD;  Location: [facility] INVASIVE CV LAB;  Service: Cardiovascular;  Laterality: N/A;   RIGHT HEART CATH N/A 03/06/2023   Procedure: RIGHT HEART CATH;  Surgeon: Darlis Eisenmenger, MD;  Location: [facility] INVASIVE CV LAB;  Service: Cardiovascular;  Laterality: N/A;   RIGHT/LEFT HEART CATH AND CORONARY ANGIOGRAPHY N/A 03/03/2023   Procedure: RIGHT/LEFT HEART CATH AND CORONARY ANGIOGRAPHY;  Surgeon: Arleen Lacer, MD;  Location: [facility] INVASIVE CV LAB;  Service: Cardiovascular;  Laterality: N/A;   TEE WITHOUT CARDIOVERSION N/A 03/09/2023   Procedure: TRANSESOPHAGEAL ECHOCARDIOGRAM;  Surgeon: Bartley Lightning, MD;  Location[facility] OR;  Service: Open Heart Surgery;  Laterality: N/A;    ROS: Review of Systems Negative except as stated above  PHYSICAL EXAM: BP 109/74 (BP Location: Left Arm, Patient Position: Sitting, Cuff Size: Normal)   Pulse 82   Temp 97.9 F (36.6 C) (Oral)   Ht 5\' 3"  (  1.6 m)   Wt 177 lb (80.3 kg)   SpO2 94%   BMI 31.35 kg/m   Wt Readings from Last 3 Encounters:  10/06/23 177 lb (80.3 kg)  09/22/23 177 lb (80.3 kg)  08/12/23 176 lb (79.8 kg)    Physical Exam  General appearance - alert, well appearing, older Caucasian  female and in no distress Mental status -patient is a little forgetful.  Normal mood, behavior, speech, dress, motor activity, and thought processes. Neck - supple, no significant adenopathy Chest - clear to auscultation, no wheezes, rales or rhonchi, symmetric air entry Heart - normal rate, regular rhythm, normal S1, S2, no murmurs, rubs, clicks or gallops Extremities -no pitting edema of the lower extremities. MSK: Ambulates with rollator walker.      Latest Ref Rng & Units 07/02/2023    9:40 AM 05/13/2023    9:10 PM 05/13/2023   10:07 AM  CMP  Glucose 65 - 99 mg/dL 161  096  87   BUN 7 - 25 mg/dL 63  57  54   Creatinine 0.50 - 1.05 mg/dL 0.45  4.09  8.11   Sodium 135 - 146 mmol/L 139  138  139   Potassium 3.5 - 5.3 mmol/L 3.7  3.9  4.0   Chloride 98 - 110 mmol/L 97  106  100   CO2 20 - 32 mmol/L 27  19  21    Calcium  8.6 - 10.4 mg/dL 9.7  9.2  9.3   Total Protein 6.5 - 8.1 g/dL  7.0    Total Bilirubin <1.2 mg/dL  0.4    Alkaline Phos 38 - 126 U/L  44    AST 15 - 41 U/L  23    ALT 0 - 44 U/L  14     Lipid Panel     Component Value Date/Time   CHOL 95 03/02/2023 0422   CHOL 351 (H) 11/18/2022 1216   TRIG 121 03/02/2023 0422   HDL 39 (L) 03/02/2023 0422   HDL 31 (L) 11/18/2022 1216   CHOLHDL 2.4 03/02/2023 0422   VLDL 24 03/02/2023 0422   LDLCALC 32 03/02/2023 0422   LDLCALC Comment (A) 11/18/2022 1216   LDLDIRECT 137 (H) 03/01/2020 1012   LDLDIRECT 96.0 09/23/2019 0828    CBC    Component Value Date/Time   WBC 9.4 05/13/2023 2110   RBC 4.40 05/13/2023 2110   HGB 11.6 (L) 05/13/2023 2110   HGB 12.8 12/08/2022 1446   HCT 37.3 05/13/2023 2110   HCT 39.4 12/08/2022 1446   PLT 196 05/13/2023 2110   PLT 249 12/08/2022 1446   MCV 84.8 05/13/2023 2110   MCV 91 12/08/2022 1446   MCH 26.4 05/13/2023 2110   MCHC 31.1 05/13/2023 2110   RDW 15.1 05/13/2023 2110   RDW 13.2 12/08/2022 1446   LYMPHSABS 1.6 05/13/2023 2110   LYMPHSABS 1.9 04/11/2021 1447   MONOABS 0.8  05/13/2023 2110   EOSABS 0.6 (H) 05/13/2023 2110   EOSABS 0.4 04/11/2021 1447   BASOSABS 0.0 05/13/2023 2110   BASOSABS 0.0 04/11/2021 1447    ASSESSMENT AND PLAN: 1. Type 2 diabetes mellitus with other circulatory complication, with long-term current use of insulin  (HCC) (Primary) Patient has had significant improvement in her A1c now that she is on Mounjaro .  Encouraged her to remain compliant with taking the Mounjaro  7.5 mg once a week, Lantus  16 units daily Jardiance  25 mg daily.  Continue Humalog  6 units with meals until she gets the OmniPod insulin  pump  connected. Discussed and encouraged healthy eating habits. - insulin  glargine (LANTUS  SOLOSTAR) 100 UNIT/ML Solostar Pen; Inject 16 Units into the skin daily. - Basic Metabolic Panel  2. Proliferative diabetic retinopathy of both eyes with macular edema associated with diabetes mellitus due to underlying condition (HCC) Followed by Dr. Seward Dao  3. Stage 3b chronic kidney disease (HCC) We will recheck BMP today to make sure that her GFR is stable.  4. Chronic congestive heart failure, unspecified heart failure type (HCC) Weight has remained stable.  Continue torsemide .  5. PAF (paroxysmal atrial fibrillation) (HCC) Currently she sounds to be in sinus rhythm.  Continue Eliquis  - apixaban  (ELIQUIS ) 5 MG TABS tablet; Take 1 tablet (5 mg total) by mouth in the morning and at bedtime.  Dispense: 180 tablet; Refill: 0  6. Coronary artery disease involving native coronary artery of native heart without angina pectoris Stable.  Continue aspirin , atorvastatin  and fenofibrate   7. Chronic anemia Currently not on iron.  We will recheck CBC and iron studies today. - CBC - Iron, TIBC and Ferritin Panel  8. Insulin  long-term use (HCC) 9. Long-term (current) use of injectable non-insulin  antidiabetic drugs 10. Diabetes mellitus treated with oral medication (HCC) See #1 above.     Patient was given the opportunity to ask questions.   Patient verbalized understanding of the plan and was able to repeat key elements of the plan.   This documentation was completed using Paediatric nurse.  Any transcriptional errors are unintentional.  No orders of the defined types were placed in this encounter.    Requested Prescriptions   Pending Prescriptions Disp Refills   apixaban  (ELIQUIS ) 5 MG TABS tablet 180 tablet 0    Sig: Take 1 tablet (5 mg total) by mouth in the morning and at bedtime.   diclofenac  Sodium (VOLTAREN ) 1 % GEL 100 g 0    Sig: Apply 2 g topically 4 (four) times daily. Apply across lower back for back pain   insulin  glargine (LANTUS  SOLOSTAR) 100 UNIT/ML Solostar Pen      Sig: Inject 16 Units into the skin daily.   lidocaine  (LIDODERM ) 5 % 30 patch 0    Sig: Place 1 patch onto the skin daily. Remove & Discard patch within 12 hours or as directed by MD    No follow-ups on file.  Concetta Dee, MD, FACP

## 2023-10-07 ENCOUNTER — Other Ambulatory Visit: Payer: Self-pay | Admitting: Internal Medicine

## 2023-10-07 ENCOUNTER — Ambulatory Visit: Admitting: Nutrition

## 2023-10-07 ENCOUNTER — Other Ambulatory Visit: Payer: Self-pay

## 2023-10-07 ENCOUNTER — Other Ambulatory Visit (HOSPITAL_COMMUNITY): Payer: Self-pay

## 2023-10-07 ENCOUNTER — Telehealth: Payer: Self-pay | Admitting: Nutrition

## 2023-10-07 DIAGNOSIS — D500 Iron deficiency anemia secondary to blood loss (chronic): Secondary | ICD-10-CM

## 2023-10-07 LAB — IRON,TIBC AND FERRITIN PANEL
Ferritin: 62 ng/mL (ref 15–150)
Iron Saturation: 17 % (ref 15–55)
Iron: 87 ug/dL (ref 27–139)
Total Iron Binding Capacity: 522 ug/dL — ABNORMAL HIGH (ref 250–450)
UIBC: 435 ug/dL — ABNORMAL HIGH (ref 118–369)

## 2023-10-07 LAB — BASIC METABOLIC PANEL WITH GFR
BUN/Creatinine Ratio: 35 — ABNORMAL HIGH (ref 12–28)
BUN: 59 mg/dL — ABNORMAL HIGH (ref 8–27)
CO2: 24 mmol/L (ref 20–29)
Calcium: 9.8 mg/dL (ref 8.7–10.3)
Chloride: 97 mmol/L (ref 96–106)
Creatinine, Ser: 1.69 mg/dL — ABNORMAL HIGH (ref 0.57–1.00)
Glucose: 130 mg/dL — ABNORMAL HIGH (ref 70–99)
Potassium: 3.7 mmol/L (ref 3.5–5.2)
Sodium: 138 mmol/L (ref 134–144)
eGFR: 33 mL/min/{1.73_m2} — ABNORMAL LOW (ref >59)

## 2023-10-07 LAB — CBC
Hematocrit: 42.4 % (ref 34.0–46.6)
Hemoglobin: 14 g/dL (ref 11.1–15.9)
MCH: 28.9 pg (ref 26.6–33.0)
MCHC: 33 g/dL (ref 31.5–35.7)
MCV: 87 fL (ref 79–97)
Platelets: 245 10*3/uL (ref 150–450)
RBC: 4.85 x10E6/uL (ref 3.77–5.28)
RDW: 14.2 % (ref 11.7–15.4)
WBC: 9.4 10*3/uL (ref 3.4–10.8)

## 2023-10-07 MED ORDER — FERROUS SULFATE 325 (65 FE) MG PO TABS
325.0000 mg | ORAL_TABLET | ORAL | 4 refills | Status: DC
  Filled 2023-10-07: qty 24, 84d supply, fill #0
  Filled 2023-10-07: qty 30, fill #0
  Filled 2024-01-10: qty 24, 84d supply, fill #1
  Filled 2024-02-19: qty 24, 84d supply, fill #2

## 2023-10-07 NOTE — Progress Notes (Signed)
 Kidney function is not at 100% and slightly worse compared to January.  Make sure she is drinking at least 4 to 6 glasses of water daily.  I will refer her to nephrology for further evaluation. Blood cell count normal. Iron studies suggest she still has some iron deficiency.  I recommend taking iron supplement at least twice a week.  Prescription sent to the pharmacy.

## 2023-10-13 NOTE — Telephone Encounter (Signed)
 Appointment reschedule for pump training on 10/13/23

## 2023-10-14 ENCOUNTER — Encounter: Admitting: Nutrition

## 2023-10-14 DIAGNOSIS — Z794 Long term (current) use of insulin: Secondary | ICD-10-CM

## 2023-10-14 NOTE — Progress Notes (Signed)
 Patient was trained on the use of the Omnipod 5 insulin  pump.   She was shown how to use the [place] 2 sensor and this was attached to her right upper, outer arm and linked to her PDM Her PDM was set to the settings per dr. Rosalea Collin: Basal rate: 6.2, target: 120 with corrections over 120, I/C: 1;1 with written instructions to take 4 grams at each meal, 1 with snack, max bolus 15, max basal: 1.2 ISF: 40, duration: 4 hours,  She was shown how to fill a pod and she did this with assistance from me.  She was encouraged to review the [facility] with directions for this and the page was turned down for her to use in 3 days. She reports having taken her lantus  last night at 11PM.  The basal rate was put in 100% reduction mode for 12 hours until 10PM tonight.  Written instructions were given to her on how to put the pump back in automated mode for this. Her pump was linked to [facility] and then to [facility].   She was shown how to bolus, making sure to put in 4 grams and adding blood sugar reading.  She re demonstrated this X2 with no assistance from me.   We reviewed alerts and alarms and high blood sugar protocols.  Another appt. Was scheduled to review this checklist in one week.  She will call if questions before then.  Telephone numbers to [place] and OmniPod's help line given to her

## 2023-10-15 NOTE — Patient Instructions (Signed)
 Change pod every 3 days Give bolus for all meals and snacks Call Libre help line if questions/problems with sensors Call OmniPod help line if quesitons or problems with pump usage Return in one week for review

## 2023-10-16 ENCOUNTER — Other Ambulatory Visit: Payer: Self-pay

## 2023-10-16 ENCOUNTER — Other Ambulatory Visit (HOSPITAL_COMMUNITY): Payer: Self-pay

## 2023-10-19 ENCOUNTER — Other Ambulatory Visit (HOSPITAL_COMMUNITY): Payer: Self-pay

## 2023-10-19 DIAGNOSIS — H353132 Nonexudative age-related macular degeneration, bilateral, intermediate dry stage: Secondary | ICD-10-CM | POA: Diagnosis not present

## 2023-10-19 DIAGNOSIS — H02833 Dermatochalasis of right eye, unspecified eyelid: Secondary | ICD-10-CM | POA: Diagnosis not present

## 2023-10-19 DIAGNOSIS — E113511 Type 2 diabetes mellitus with proliferative diabetic retinopathy with macular edema, right eye: Secondary | ICD-10-CM | POA: Diagnosis not present

## 2023-10-19 DIAGNOSIS — H35033 Hypertensive retinopathy, bilateral: Secondary | ICD-10-CM | POA: Diagnosis not present

## 2023-10-19 DIAGNOSIS — H43813 Vitreous degeneration, bilateral: Secondary | ICD-10-CM | POA: Diagnosis not present

## 2023-10-19 DIAGNOSIS — H02836 Dermatochalasis of left eye, unspecified eyelid: Secondary | ICD-10-CM | POA: Diagnosis not present

## 2023-10-20 ENCOUNTER — Encounter: Attending: Internal Medicine | Admitting: Nutrition

## 2023-10-20 DIAGNOSIS — Z794 Long term (current) use of insulin: Secondary | ICD-10-CM | POA: Insufficient documentation

## 2023-10-20 DIAGNOSIS — E1165 Type 2 diabetes mellitus with hyperglycemia: Secondary | ICD-10-CM | POA: Insufficient documentation

## 2023-10-20 NOTE — Patient Instructions (Signed)
 Call [place] help line if sensor falls off, or if error code appears Call OmniPod help line if sensor fails to read and keeps saying "searching".

## 2023-10-20 NOTE — Progress Notes (Signed)
 Patient reports no difficulty giving bolus.  She changed the pod X1 without difficulty.  However, the sensor stopped working, without an error code and still continues to say searching.  New sensor was started with 1 hour warm up.  Reviewed all topics on the check list and patient sign the checklist as understanding all topic.  OmniPod and Libre help lines called to  see why this sensor stopped working.  No explanation given.  New sensor will be sent to the patient.  She had no final questions for me.

## 2023-10-22 ENCOUNTER — Encounter: Payer: Self-pay | Admitting: Internal Medicine

## 2023-10-28 ENCOUNTER — Other Ambulatory Visit: Payer: Self-pay

## 2023-10-28 ENCOUNTER — Other Ambulatory Visit (HOSPITAL_COMMUNITY): Payer: Self-pay

## 2023-10-28 DIAGNOSIS — Z961 Presence of intraocular lens: Secondary | ICD-10-CM | POA: Diagnosis not present

## 2023-10-28 DIAGNOSIS — E113593 Type 2 diabetes mellitus with proliferative diabetic retinopathy without macular edema, bilateral: Secondary | ICD-10-CM | POA: Diagnosis not present

## 2023-10-28 DIAGNOSIS — H401134 Primary open-angle glaucoma, bilateral, indeterminate stage: Secondary | ICD-10-CM | POA: Diagnosis not present

## 2023-10-28 MED ORDER — BRIMONIDINE TARTRATE 0.2 % OP SOLN
1.0000 [drp] | Freq: Two times a day (BID) | OPHTHALMIC | 3 refills | Status: AC
  Filled 2023-10-28: qty 15, 75d supply, fill #0
  Filled 2023-10-28: qty 180, 45d supply, fill #0
  Filled 2024-01-10: qty 15, 75d supply, fill #1
  Filled 2024-05-25: qty 15, 75d supply, fill #2

## 2023-10-30 ENCOUNTER — Other Ambulatory Visit: Payer: Self-pay

## 2023-10-30 ENCOUNTER — Other Ambulatory Visit (HOSPITAL_COMMUNITY): Payer: Self-pay

## 2023-10-30 ENCOUNTER — Other Ambulatory Visit: Payer: Self-pay | Admitting: Physician Assistant

## 2023-10-30 DIAGNOSIS — Z794 Long term (current) use of insulin: Secondary | ICD-10-CM

## 2023-10-30 MED ORDER — ACCU-CHEK SOFTCLIX LANCETS MISC
3 refills | Status: AC
  Filled 2023-10-30: qty 100, 33d supply, fill #0
  Filled 2023-12-31 – 2024-01-04 (×2): qty 100, 33d supply, fill #1
  Filled 2024-04-04 – 2024-04-06 (×2): qty 100, 33d supply, fill #2
  Filled 2024-05-25 (×2): qty 100, 33d supply, fill #3

## 2023-11-12 ENCOUNTER — Other Ambulatory Visit: Payer: Self-pay

## 2023-11-12 ENCOUNTER — Other Ambulatory Visit: Payer: Self-pay | Admitting: Internal Medicine

## 2023-11-12 ENCOUNTER — Other Ambulatory Visit (HOSPITAL_COMMUNITY): Payer: Self-pay

## 2023-11-12 DIAGNOSIS — I2510 Atherosclerotic heart disease of native coronary artery without angina pectoris: Secondary | ICD-10-CM

## 2023-11-12 MED ORDER — FENOFIBRATE 48 MG PO TABS
48.0000 mg | ORAL_TABLET | Freq: Every day | ORAL | 1 refills | Status: DC
  Filled 2023-11-12: qty 90, 90d supply, fill #0

## 2023-11-13 ENCOUNTER — Other Ambulatory Visit (HOSPITAL_COMMUNITY): Payer: Self-pay

## 2023-11-13 ENCOUNTER — Other Ambulatory Visit: Payer: Self-pay

## 2023-11-13 NOTE — Progress Notes (Signed)
 Cardiology Office Note:    Date:  11/16/2023   ID:  Emily Farmer, DOB 03-06-1956, MRN [id number]  PCP:  Emily Presume, MD   Emily Farmer Cardiologist:  Emily Lites, MD Cardiology APP:  Emily Pilot, PA  Electrophysiologist:  Emily Byes, MD     Referring MD: Emily Presume, MD   Chief Complaint  Patient presents with   Follow-up    CAD, AFib    History of Present Illness:    Emily Farmer is a 68 y.o. female with a hx of CAD s/p CABG x 3 with SVG-LAD, SVG-OM, SVG-PDA, on 03/09/2023, permanent A-fib s/p AV node ablation and single-chamber PPM 12/2022, DM2 on insulin , hyperlipidemia.  Approximately 2 months after ablation and PPM placement, she presented with chest heaviness and shortness of breath on exertion.  Echocardiogram 03/02/2023 showed an EF 60 to 65%, no WMA, mildly reduced RV, severely elevated PASP, mild MR.  She underwent right left heart catheterization 03/03/2023 that showed multivessel CAD and severe pulmonary hypertension.  She was referred to advanced heart failure clinic who titrated diuresis SGLT2 inhibitor and recommended CABG.  She underwent CABG x 3 on 03/09/23. LIMA was small with slow blood flow and felt not suitable as a bypass conduit.  Postop course has been complicated by preoperative deconditioning.  She required midodrine  wean to 5 mg twice daily prior to discharge.  She restarted her home 100 mg Toprol  and 120 mg Cardizem , midodrine  was discontinued at CT surgery follow-up.  She has had subsequent ER visits for chest discomfort, last ER visit was the same day she saw CT surgery in the office -05/13/2023.  I saw her for routine cardiology follow-up 06/04/2023.  She is in an area in which her pacemaker transmitter does not work due to cell tower coverage.  Confirmed with device clinic.  She was very confused about her diuretic regimen when I saw her with an AKI.  She was possibly taking furosemide  and torsemide   simultaneously.  I discontinued furosemide  and continue 20 mg torsemide  as needed.  Volume status has been difficult as she lives with her sister who cooks fried foods on most nights.  She reported taking torsemide  4-5 times per week.  She was volume up and that temporarily increased her torsemide  to 40 mg x 3 days then increased regimen to 20 mg torsemide  daily.  She presents today for clinic follow-up.  She is doing well on 20 mg torsemide  per day. On her days to cook, she is baking but her sister still fries foods. She denies signs and symptoms of volume overload. No chest pain.   She is now on insulin  pump and mounjaro . She has gradually increased weight but this is from calories, not fluid. BP log shows BP adequately controlled.    Past Medical History:  Diagnosis Date   Arthritis    Atrial fibrillation (HCC)    a. 09/2016 in setting of pancreatitis;  b. 09/2016 Echo: EF 65-60%, no rwma, Gr2 DD, mild MR, triv TR, PASP ;  CHA2DS2VASc = 4-->Eliquis  5mg  BID. c. Recurred 06/2018 in setting of GIB.   Chronic diastolic CHF (congestive heart failure) (HCC) 01/28/2018   Diabetes mellitus    GI bleeding 06/2018   Gout    Hyperlipidemia    Hypertension    Hypertriglyceridemia    Pancreatitis    a. 09/2016 - Triglycerides 1,392 on admission.    Past Surgical History:  Procedure Laterality Date   AV NODE ABLATION N/A  01/02/2023   Procedure: AV NODE ABLATION;  Surgeon: Tammie Fall, MD;  Location: [facility] INVASIVE CV LAB;  Service: Cardiovascular;  Laterality: N/A;   BIOPSY  06/20/2018   Procedure: BIOPSY;  Surgeon: Ozell Blunt, MD;  Location: [facility] ENDOSCOPY;  Service: Endoscopy;;   CHOLECYSTECTOMY N/A 01/04/2014   Procedure: LAPAROSCOPIC CHOLECYSTECTOMY WITH INTRAOPERATIVE CHOLANGIOGRAM;  Surgeon: Shela Derby, MD;  Location: [facility] OR;  Service: General;  Laterality: N/A;   COLONOSCOPY WITH PROPOFOL  N/A 06/21/2018   Procedure: COLONOSCOPY WITH PROPOFOL ;  Surgeon: Lanita Pitman, MD;  Location: [facility]  ENDOSCOPY;  Service: Endoscopy;  Laterality: N/A;   CORONARY ARTERY BYPASS GRAFT N/A 03/09/2023   Procedure: CORONARY ARTERY BYPASS GRAFTING (CABG) TIMES THREE USING THE LEFT INTERNAL MAMMARY ARTERY (LIMA) AND ENDOSCOPICALLY HARVESTED RIGHT GREATER SAPHENOUS VEIN;  Surgeon: Bartley Lightning, MD;  Location: [facility] OR;  Service: Open Heart Surgery;  Laterality: N/A;   ESOPHAGOGASTRODUODENOSCOPY (EGD) WITH PROPOFOL  N/A 06/20/2018   Procedure: ESOPHAGOGASTRODUODENOSCOPY (EGD) WITH PROPOFOL ;  Surgeon: Ozell Blunt, MD;  Location: [facility] ENDOSCOPY;  Service: Endoscopy;  Laterality: N/A;   IR THORACENTESIS ASP PLEURAL SPACE W/IMG GUIDE  03/16/2023   LUNG SURGERY     PACEMAKER IMPLANT N/A 03/03/2022   Procedure: PACEMAKER IMPLANT;  Surgeon: Tammie Fall, MD;  Location: [facility] INVASIVE CV LAB;  Service: Cardiovascular;  Laterality: N/A;   RIGHT HEART CATH N/A 03/06/2023   Procedure: RIGHT HEART CATH;  Surgeon: Darlis Eisenmenger, MD;  Location: [facility] INVASIVE CV LAB;  Service: Cardiovascular;  Laterality: N/A;   RIGHT/LEFT HEART CATH AND CORONARY ANGIOGRAPHY N/A 03/03/2023   Procedure: RIGHT/LEFT HEART CATH AND CORONARY ANGIOGRAPHY;  Surgeon: Arleen Lacer, MD;  Location: [facility] INVASIVE CV LAB;  Service: Cardiovascular;  Laterality: N/A;   TEE WITHOUT CARDIOVERSION N/A 03/09/2023   Procedure: TRANSESOPHAGEAL ECHOCARDIOGRAM;  Surgeon: Bartley Lightning, MD;  Location: [facility] OR;  Service: Open Heart Surgery;  Laterality: N/A;    Current Medications: Current Meds  Medication Sig   Accu-Chek Softclix Lancets lancets Use to check blood sugar 3 times daily.   acetaminophen  (TYLENOL ) 325 MG tablet Take 2 tablets (650 mg total) by mouth every 6 (six) hours as needed for mild pain (or Fever >/= 101).   Blood Glucose Monitoring Suppl (ACCU-CHEK GUIDE) w/Device KIT Use to check blood sugar 3 times daily.   Blood Pressure Monitoring (BLOOD PRESSURE MONITOR/ARM) DEVI Check blood pressure once per day at least 2 hours after medications.    brimonidine  (ALPHAGAN ) 0.2 % ophthalmic solution Instill 1 drop into both eyes twice a day   Continuous Glucose Sensor (DEXCOM G7 SENSOR) MISC 1 Device by Does not apply route as directed.   Continuous Glucose Sensor (FREESTYLE LIBRE 2 PLUS SENSOR) MISC Use as directred to test blood sugar. Change sensor every 14 days .   diclofenac  Sodium (VOLTAREN ) 1 % GEL Apply 2 g topically 4 (four) times daily. Apply across lower back for back pain.   empagliflozin  (JARDIANCE ) 25 MG TABS tablet Take 1 tablet (25 mg total) by mouth daily before breakfast.   ferrous sulfate  325 (65 FE) MG tablet Take 1 tablet (325 mg total) by mouth 2 (two) times a week.   glucose blood (TRUE METRIX BLOOD GLUCOSE TEST) test strip Use as instructed   Insulin  Disposable Pump (OMNIPOD 5 LIBRE2 PLUS G6 PODS) MISC 1 each by Does not apply route every 3 (three) days.   insulin  glargine (LANTUS  SOLOSTAR) 100 UNIT/ML Solostar Pen Inject 16 Units into the skin daily.   insulin  lispro (  HUMALOG  KWIKPEN) 100 UNIT/ML KwikPen Inject 6 Units into the skin 3 (three) times daily. Max daily 45 units   insulin  lispro (HUMALOG ) 100 UNIT/ML injection Inject 36 units per day via insulin  pump   Insulin  Pen Needle 32G X 4 MM MISC 1 Device by Does not apply route in the morning, at noon, in the evening, and at bedtime.   Insulin  Syringe-Needle U-100 (TRUEPLUS INSULIN  SYRINGE) 31G X 5/16" 0.3 ML MISC Use to inject insulin  daily. (Patient taking differently: 1 each by Other route See admin instructions. Use to inject insulin  daily.)   Multiple Vitamins-Minerals (CENTRUM SILVER  PO) Take 1 tablet by mouth daily.   Multiple Vitamins-Minerals (OCUVITE ADULT 50+) CAPS Take 1 capsule by mouth daily.   tirzepatide  (MOUNJARO ) 7.5 MG/0.5ML Pen Inject 7.5 mg into the skin once a week.   [DISCONTINUED] apixaban  (ELIQUIS ) 5 MG TABS tablet Take 1 tablet (5 mg total) by mouth in the morning and at bedtime.   [DISCONTINUED] aspirin  EC 81 MG tablet Take 1 tablet (81 mg  total) by mouth daily. Swallow whole.   [DISCONTINUED] atorvastatin  (LIPITOR) 40 MG tablet Take 1 tablet (40 mg total) by mouth daily.   [DISCONTINUED] fenofibrate  (TRICOR ) 48 MG tablet Take 1 tablet (48 mg total) by mouth daily.   [DISCONTINUED] Insulin  Disposable Pump (OMNIPOD 5 DEXG7G6 INTRO GEN 5) KIT Change pod every 3 days   [DISCONTINUED] Insulin  Disposable Pump (OMNIPOD 5 DEXG7G6 INTRO GEN 5) KIT Change pod every 3 days   [DISCONTINUED] Insulin  Disposable Pump (OMNIPOD 5 DEXG7G6 PODS GEN 5) MISC Change pods every 3 days   [DISCONTINUED] Insulin  Disposable Pump (OMNIPOD 5 DEXG7G6 PODS GEN 5) MISC Change pod every 3 days   [DISCONTINUED] torsemide  (DEMADEX ) 20 MG tablet Take 1 tablet (20 mg) once a day, Take extra dose of 20 mg as needed for sudden weight gain and leg swelling (Patient taking differently: Take 20 mg by mouth daily as needed.)     Allergies:   Trulicity  [dulaglutide ]   Social History   Socioeconomic History   Marital status: Single    Spouse name: Not on file   Number of children: Not on file   Years of education: Not on file   Highest education level: Not on file  Occupational History   Not on file  Tobacco Use   Smoking status: Never   Smokeless tobacco: Never  Vaping Use   Vaping status: Never Used  Substance and Sexual Activity   Alcohol  use: No   Drug use: No   Sexual activity: Not Currently  Other Topics Concern   Not on file  Social History Narrative   Lives with her sister and does not need any assistance with ADLs.     Social Drivers of Health   Financial Resource Strain: Medium Risk (05/18/2023)   Overall Financial Resource Strain (CARDIA)    Difficulty of Paying Living Expenses: Somewhat hard  Food Insecurity: No Food Insecurity (06/25/2023)   Hunger Vital Sign    Worried About Running Out of Food in the Last Year: Never true    Ran Out of Food in the Last Year: Never true  Recent Concern: Food Insecurity - Food Insecurity Present  (06/05/2023)   Hunger Vital Sign    Worried About Running Out of Food in the Last Year: Sometimes true    Ran Out of Food in the Last Year: Sometimes true  Transportation Needs: No Transportation Needs (06/25/2023)   PRAPARE - Administrator, Civil Service (Medical):  No    Lack of Transportation (Non-Medical): No  Recent Concern: Transportation Needs - Unmet Transportation Needs (05/06/2023)   PRAPARE - Transportation    Lack of Transportation (Medical): Yes    Lack of Transportation (Non-Medical): Yes  Physical Activity: Insufficiently Active (05/06/2023)   Exercise Vital Sign    Days of Exercise per Week: 7 days    Minutes of Exercise per Session: 20 min  Stress: No Stress Concern Present (05/18/2023)   [employer] of Occupational Health - Occupational Stress Questionnaire    Feeling of Stress : Not at all  Recent Concern: Stress - Stress Concern Present (05/06/2023)   [employer] of Occupational Health - Occupational Stress Questionnaire    Feeling of Stress : Very much  Social Connections: Socially Isolated (05/18/2023)   Social Connection and Isolation Panel [NHANES]    Frequency of Communication with Friends and Family: Once a week    Frequency of Social Gatherings with Friends and Family: Once a week    Attends Religious Services: Never    Database administrator or Organizations: No    Attends Engineer, structural: Never    Marital Status: Never married     Family History: The patient's family history includes Diabetes in her sister; Heart attack in her father and mother. There is no history of High blood pressure or High Cholesterol.  ROS:   Please see the history of present illness.     All other systems reviewed and are negative.  EKGs/Labs/Other Studies Reviewed:    The following studies were reviewed today:       Recent Labs: 03/10/2023: Magnesium  2.5 04/13/2023: B Natriuretic Peptide 223.5 05/13/2023: ALT 14 10/06/2023: BUN 59;  Creatinine, Ser 1.69; Hemoglobin 14.0; Platelets 245; Potassium 3.7; Sodium 138  Recent Lipid Panel    Component Value Date/Time   CHOL 95 03/02/2023 0422   CHOL 351 (H) 11/18/2022 1216   TRIG 121 03/02/2023 0422   HDL 39 (L) 03/02/2023 0422   HDL 31 (L) 11/18/2022 1216   CHOLHDL 2.4 03/02/2023 0422   VLDL 24 03/02/2023 0422   LDLCALC 32 03/02/2023 0422   LDLCALC Comment (A) 11/18/2022 1216   LDLDIRECT 137 (H) 03/01/2020 1012   LDLDIRECT 96.0 09/23/2019 0828     Risk Assessment/Calculations:    CHA2DS2-VASc Score = 5   This indicates a 7.2% annual risk of stroke. The patient's score is based upon: CHF History: 0 HTN History: 1 Diabetes History: 1 Stroke History: 0 Vascular Disease History: 1 Age Score: 1 Gender Score: 1             Physical Exam:    VS:  BP 122/70 (BP Location: Left Arm, Patient Position: Sitting, Cuff Size: Normal)   Pulse 88   Ht 5\' 3"  (1.6 m)   Wt 180 lb 9.6 oz (81.9 kg)   SpO2 96%   BMI 31.99 kg/m     Wt Readings from Last 3 Encounters:  11/16/23 180 lb 9.6 oz (81.9 kg)  10/06/23 177 lb (80.3 kg)  09/22/23 177 lb (80.3 kg)     GEN:  Well nourished, well developed in no acute distress HEENT: Normal NECK: No JVD; No carotid bruits LYMPHATICS: No lymphadenopathy CARDIAC: RRR, no murmurs, rubs, gallops RESPIRATORY:  Clear to auscultation without rales, wheezing or rhonchi  ABDOMEN: Soft, non-tender, non-distended MUSCULOSKELETAL:  No edema; No deformity  SKIN: Warm and dry NEUROLOGIC:  Alert and oriented x 3 PSYCHIATRIC:  Normal affect   ASSESSMENT:  1. Coronary artery disease involving native coronary artery of native heart with unstable angina pectoris (HCC)   2. S/P CABG x 3   3. Persistent atrial fibrillation (HCC)   4. Chronic anticoagulation   5. Pacemaker   6. Hyperlipidemia with target LDL less than 70   7. Coronary artery disease involving native coronary artery of native heart without angina pectoris   8. PAF  (paroxysmal atrial fibrillation) (HCC)    PLAN:    In order of problems listed above:  CAD s/p CABG x 3 on 02/2023 SVG-LAD, SVG-OM, SVG-PDA - Remains on 81 mg aspirin , 40 mg Lipitor -- no chest pain -- continue 25 mg jardiance    Permanent atrial fibrillation Chronic anticoagulation PPM in place - Remains on 5 mg Eliquis  twice daily,  -- no longer on 100 mg Toprol  or 120 mg Cardizem  -- no interrogation - has to be in-clinic -- defer to EP   Hyperlipidemia with LDL goal less than 55 - Given use of SVG to LAD and insulin -dependent diabetes, would recommend a lower LDL goal -Has been maintained on 40 mg Lipitor, 40 mg fenofibrate  03/02/2023: Cholesterol 95; HDL 39; LDL Cholesterol 32; Triglycerides 121; VLDL 24 Consider drawing LPA with next labs   Pulmonary hypertension Chronic diastolic heart failure - 20 mg torsemide  daily -- euvolemic today   Follow up in 6 months with Dr. Maximo Spar, me in 1  year.         Medication Adjustments/Labs and Tests Ordered: Current medicines are reviewed at length with the patient today.  Concerns regarding medicines are outlined above.  No orders of the defined types were placed in this encounter.  Meds ordered this encounter  Medications   fenofibrate  (TRICOR ) 48 MG tablet    Sig: Take 1 tablet (48 mg total) by mouth daily.    Dispense:  90 tablet    Refill:  1   torsemide  (DEMADEX ) 20 MG tablet    Sig: Take 1 tablet (20 mg) once a day, Take extra dose of 20 mg as needed for sudden weight gain and leg swelling    Dispense:  90 tablet    Refill:  3   atorvastatin  (LIPITOR) 40 MG tablet    Sig: Take 1 tablet (40 mg total) by mouth daily.    Dispense:  90 tablet    Refill:  3   aspirin  EC 81 MG tablet    Sig: Take 1 tablet (81 mg total) by mouth daily. Swallow whole.   apixaban  (ELIQUIS ) 5 MG TABS tablet    Sig: Take 1 tablet (5 mg total) by mouth in the morning and at bedtime.    Dispense:  180 tablet    Refill:  0    There are  no Patient Instructions on file for this visit.   Signed, Emily Farmer, [place]  11/16/2023 8:55 AM    [facility]

## 2023-11-16 ENCOUNTER — Other Ambulatory Visit: Payer: Self-pay

## 2023-11-16 ENCOUNTER — Other Ambulatory Visit (HOSPITAL_COMMUNITY): Payer: Self-pay

## 2023-11-16 ENCOUNTER — Ambulatory Visit: Payer: Medicare HMO | Attending: Physician Assistant | Admitting: Physician Assistant

## 2023-11-16 ENCOUNTER — Encounter: Payer: Self-pay | Admitting: Physician Assistant

## 2023-11-16 VITALS — BP 122/70 | HR 88 | Ht 63.0 in | Wt 180.6 lb

## 2023-11-16 DIAGNOSIS — Z950 Presence of cardiac pacemaker: Secondary | ICD-10-CM

## 2023-11-16 DIAGNOSIS — I4819 Other persistent atrial fibrillation: Secondary | ICD-10-CM

## 2023-11-16 DIAGNOSIS — Z951 Presence of aortocoronary bypass graft: Secondary | ICD-10-CM | POA: Diagnosis not present

## 2023-11-16 DIAGNOSIS — E785 Hyperlipidemia, unspecified: Secondary | ICD-10-CM | POA: Diagnosis not present

## 2023-11-16 DIAGNOSIS — I2510 Atherosclerotic heart disease of native coronary artery without angina pectoris: Secondary | ICD-10-CM | POA: Diagnosis not present

## 2023-11-16 DIAGNOSIS — Z7901 Long term (current) use of anticoagulants: Secondary | ICD-10-CM | POA: Diagnosis not present

## 2023-11-16 DIAGNOSIS — I25110 Atherosclerotic heart disease of native coronary artery with unstable angina pectoris: Secondary | ICD-10-CM

## 2023-11-16 DIAGNOSIS — I480 Paroxysmal atrial fibrillation: Secondary | ICD-10-CM | POA: Diagnosis not present

## 2023-11-16 MED ORDER — TORSEMIDE 20 MG PO TABS
ORAL_TABLET | ORAL | 3 refills | Status: AC
  Filled 2023-11-16: qty 90, fill #0
  Filled 2023-11-16: qty 90, 45d supply, fill #0
  Filled 2024-04-17: qty 90, 45d supply, fill #1
  Filled 2024-05-31: qty 90, 45d supply, fill #2

## 2023-11-16 MED ORDER — FENOFIBRATE 48 MG PO TABS
48.0000 mg | ORAL_TABLET | Freq: Every day | ORAL | 1 refills | Status: AC
  Filled 2023-11-16 – 2024-05-25 (×2): qty 90, 90d supply, fill #0

## 2023-11-16 MED ORDER — ATORVASTATIN CALCIUM 40 MG PO TABS
40.0000 mg | ORAL_TABLET | Freq: Every day | ORAL | 3 refills | Status: AC
  Filled 2023-11-16 – 2024-02-23 (×4): qty 90, 90d supply, fill #0
  Filled 2024-05-25: qty 90, 90d supply, fill #1

## 2023-11-16 MED ORDER — APIXABAN 5 MG PO TABS
5.0000 mg | ORAL_TABLET | Freq: Two times a day (BID) | ORAL | 0 refills | Status: DC
  Filled 2023-11-16: qty 180, 90d supply, fill #0

## 2023-11-16 MED ORDER — ASPIRIN 81 MG PO TBEC
81.0000 mg | DELAYED_RELEASE_TABLET | Freq: Every day | ORAL | Status: AC
Start: 2023-11-16 — End: ?

## 2023-11-16 NOTE — Patient Instructions (Signed)
 Medication Instructions:  Your physician recommends that you continue on your current medications as directed. Please refer to the Current Medication list given to you today.  *If you need a refill on your cardiac medications before your next appointment, please call your pharmacy*  Lab Work: NONE ordered at this time of appointment   Testing/Procedures: NONE ordered at this time of appointment   Follow-Up: At [facility], you and your health needs are our priority.  As part of our continuing mission to provide you with exceptional heart care, our providers are all part of one team.  This team includes your primary Cardiologist (physician) and Advanced Practice Providers or APPs (Physician Assistants and Nurse Practitioners) who all work together to provide you with the care you need, when you need it.  Your next appointment:    6 months (Dr. Maximo Spar) 1 year Shelvy Dickens [facility] [place])  Provider:   Hazle Lites, MD or Marcie Sever, PA-C          We recommend signing up for the patient portal called "MyChart".  Sign up information is provided on this After Visit Summary.  MyChart is used to connect with patients for Virtual Visits (Telemedicine).  Patients are able to view lab/test results, encounter notes, upcoming appointments, etc.  Non-urgent messages can be sent to your provider as well.   To learn more about what you can do with MyChart, go to [email or web address].   Other Instructions

## 2023-11-25 ENCOUNTER — Other Ambulatory Visit (HOSPITAL_COMMUNITY): Payer: Self-pay

## 2023-11-30 ENCOUNTER — Other Ambulatory Visit (HOSPITAL_COMMUNITY): Payer: Self-pay

## 2023-12-01 ENCOUNTER — Other Ambulatory Visit: Payer: Self-pay

## 2023-12-08 ENCOUNTER — Telehealth: Payer: Self-pay | Admitting: Pharmacist

## 2023-12-08 ENCOUNTER — Ambulatory Visit: Attending: Internal Medicine | Admitting: Internal Medicine

## 2023-12-08 ENCOUNTER — Other Ambulatory Visit: Payer: Self-pay

## 2023-12-08 ENCOUNTER — Other Ambulatory Visit (HOSPITAL_COMMUNITY): Payer: Self-pay

## 2023-12-08 ENCOUNTER — Other Ambulatory Visit: Payer: Self-pay | Admitting: Pharmacist

## 2023-12-08 ENCOUNTER — Encounter: Payer: Self-pay | Admitting: Internal Medicine

## 2023-12-08 VITALS — BP 117/74 | HR 79 | Temp 97.5°F | Ht 63.0 in | Wt 177.0 lb

## 2023-12-08 DIAGNOSIS — Z794 Long term (current) use of insulin: Secondary | ICD-10-CM

## 2023-12-08 DIAGNOSIS — Z7189 Other specified counseling: Secondary | ICD-10-CM | POA: Diagnosis not present

## 2023-12-08 DIAGNOSIS — Z0000 Encounter for general adult medical examination without abnormal findings: Secondary | ICD-10-CM | POA: Diagnosis not present

## 2023-12-08 DIAGNOSIS — F99 Mental disorder, not otherwise specified: Secondary | ICD-10-CM | POA: Diagnosis not present

## 2023-12-08 MED ORDER — LANTUS SOLOSTAR 100 UNIT/ML ~~LOC~~ SOPN
16.0000 [IU] | PEN_INJECTOR | Freq: Every day | SUBCUTANEOUS | 1 refills | Status: DC
Start: 2023-12-08 — End: 2023-12-25
  Filled 2023-12-08: qty 15, 93d supply, fill #0
  Filled 2023-12-08: qty 12, 75d supply, fill #0

## 2023-12-08 MED ORDER — APIXABAN 5 MG PO TABS
5.0000 mg | ORAL_TABLET | Freq: Two times a day (BID) | ORAL | 1 refills | Status: DC
  Filled 2023-12-08 – 2024-05-25 (×2): qty 180, 90d supply, fill #0

## 2023-12-08 MED ORDER — APIXABAN 5 MG PO TABS: 5.0000 mg | ORAL_TABLET | Freq: Two times a day (BID) | ORAL | 1 refills | Status: DC

## 2023-12-08 MED ORDER — INSULIN PEN NEEDLE 32G X 4 MM MISC
1.0000 | Freq: Four times a day (QID) | 3 refills | Status: DC
  Filled 2023-12-08: qty 400, 100d supply, fill #0
  Filled 2023-12-08: qty 300, 75d supply, fill #0

## 2023-12-08 NOTE — Patient Instructions (Signed)
 If you execute a living will or healthcare power of attorney, please remember to bring a copy of the document for our record.

## 2023-12-08 NOTE — Telephone Encounter (Signed)
 Received request from patient for several refills. I discussed her requests with Dr. Ferdie CMA, Clarisa.   Patient was requesting several medications that we had previously delivered and are too soon to refill. Those include:   - Eliquis : confirmed delivery of 90-day supply 10/06/23 - Empagliflozin : confirmed delivery of 90-day supply to pt's address 10/07/23 - Fenofibrate : confirmed delivery of 90-day supply to pt's address 11/16/23 - Ferrous sulfate : confirmed delivery of 90-day supply to pt's address 10/07/23 - Torsemide : confirmed delivery of 90-day supply to pt's address 11/18/2023  The above rxns are not due to be filled as of 12/08/23. Insurance will not pay for them yet. Patient should check and make sure she has these at home.   The below rxns are due for refills:   - Freestyle Libre supplies  - Humalog  - Lantus  - Mounjaro  - Pen needles   These are bing filled and sent out by our pharmacy today.   Lastly, patient is requesting OmniPod supplies. These will likely need to be sent by Dr. Sam.

## 2023-12-08 NOTE — Progress Notes (Signed)
 Subjective:    Emily Farmer is a 68 y.o. female who presents for a Welcome to Medicare exam.  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 General: Older Caucasian female in NAD.  She ambulates with rollator walker.  Gait is stiff. Neck: No lymphadenopathy Chest: Clear to auscultation bilaterally CVS: She seems to be in sinus rhythm with regular rate and rhythm at this time. Extremities: No edema.  She has varicose veins in the lower legs.  Medications Outpatient Encounter Medications as of 12/08/2023  Medication Sig   [DISCONTINUED] apixaban  (ELIQUIS ) 5 MG TABS tablet Take 1 tablet (5 mg total) by mouth in the morning and at bedtime.   Accu-Chek Softclix Lancets lancets Use to check blood sugar 3 times daily.   acetaminophen  (TYLENOL ) 325 MG tablet Take 2 tablets (650 mg total) by mouth every 6 (six) hours as needed for mild pain (or Fever >/= 101).   aspirin  EC 81 MG tablet Take 1 tablet (81 mg total) by mouth daily. Swallow whole.   atorvastatin  (LIPITOR) 40 MG tablet Take 1 tablet (40 mg total) by mouth daily.   Blood Glucose Monitoring Suppl (ACCU-CHEK GUIDE) w/Device KIT Use to check blood sugar 3 times daily.   Blood Pressure Monitoring (BLOOD PRESSURE MONITOR/ARM) DEVI Check blood pressure  once per day at least 2 hours after medications.   brimonidine  (ALPHAGAN ) 0.2 % ophthalmic solution Instill 1 drop into both eyes twice a day   Continuous Glucose Sensor (DEXCOM G7 SENSOR) MISC 1 Device by Does not apply route as directed.   Continuous Glucose Sensor (FREESTYLE LIBRE 2 PLUS SENSOR) MISC Use as directred to test blood sugar. Change sensor every 14 days .   diclofenac  Sodium (VOLTAREN ) 1 % GEL Apply 2 g topically 4 (four) times daily. Apply across lower back for back pain.   empagliflozin  (JARDIANCE ) 25 MG TABS tablet Take 1 tablet (25 mg total) by mouth daily before breakfast.   fenofibrate  (TRICOR ) 48 MG tablet Take 1 tablet (48 mg total) by mouth daily.   ferrous sulfate  325 (65 FE) MG tablet Take 1 tablet (325 mg total) by mouth 2 (two) times a week.   glucose blood (TRUE METRIX BLOOD GLUCOSE TEST) test strip Use as instructed   Insulin  Disposable Pump (OMNIPOD 5 LIBRE2 PLUS G6 PODS) MISC 1 each by Does not apply route every 3 (three) days.   insulin  lispro (HUMALOG  KWIKPEN) 100 UNIT/ML KwikPen Inject 6 Units into the skin 3 (three) times daily. Max daily 45 units   insulin  lispro (HUMALOG ) 100 UNIT/ML injection Inject 36 units per day via insulin  pump   Insulin  Syringe-Needle U-100 (TRUEPLUS INSULIN  SYRINGE) 31G X 5/16 0.3 ML MISC Use to inject insulin  daily. (Patient taking differently: 1 each by Other route  See admin instructions. Use to inject insulin  daily.)   Multiple Vitamins-Minerals (CENTRUM SILVER  PO) Take 1 tablet by mouth daily.   Multiple Vitamins-Minerals (OCUVITE ADULT 50+) CAPS Take 1 capsule by mouth daily.   tirzepatide  (MOUNJARO ) 7.5 MG/0.5ML Pen Inject 7.5 mg into the skin once a week.   torsemide  (DEMADEX ) 20 MG tablet Take 1 tablet (20 mg) once a day, Take extra dose of 20 mg as needed for sudden weight gain and leg swelling   [DISCONTINUED] apixaban  (ELIQUIS ) 5 MG TABS tablet Take 1 tablet (5 mg total) by mouth in the morning and at bedtime.    [DISCONTINUED] insulin  glargine (LANTUS  SOLOSTAR) 100 UNIT/ML Solostar Pen Inject 16 Units into the skin daily.   [DISCONTINUED] Insulin  Pen Needle 32G X 4 MM MISC 1 Device by Does not apply route in the morning, at noon, in the evening, and at bedtime.   No facility-administered encounter medications on file as of 12/08/2023.     History: Past Medical History:  Diagnosis Date   Arthritis    Atrial fibrillation (HCC)    a. 09/2016 in setting of pancreatitis;  b. 09/2016 Echo: EF 65-60%, no rwma, Gr2 DD, mild MR, triv TR, PASP ;  CHA2DS2VASc = 4-->Eliquis  5mg  BID. c. Recurred 06/2018 in setting of GIB.   Chronic diastolic CHF (congestive heart failure) (HCC) 01/28/2018   Diabetes mellitus    GI bleeding 06/2018   Gout    Hyperlipidemia    Hypertension    Hypertriglyceridemia    Pancreatitis    a. 09/2016 - Triglycerides 1,392 on admission.   Past Surgical History:  Procedure Laterality Date   AV NODE ABLATION N/A 01/02/2023   Procedure: AV NODE ABLATION;  Surgeon: Waddell Danelle ORN, MD;  Location: [facility] INVASIVE CV LAB;  Service: Cardiovascular;  Laterality: N/A;   BIOPSY  06/20/2018   Procedure: BIOPSY;  Surgeon: Rosalie Kitchens, MD;  Location: [facility] ENDOSCOPY;  Service: Endoscopy;;   CHOLECYSTECTOMY N/A 01/04/2014   Procedure: LAPAROSCOPIC CHOLECYSTECTOMY WITH INTRAOPERATIVE CHOLANGIOGRAM;  Surgeon: Lynda Leos, MD;  Location: [facility] OR;  Service: General;  Laterality: N/A;   COLONOSCOPY WITH PROPOFOL  N/A 06/21/2018   Procedure: COLONOSCOPY WITH PROPOFOL ;  Surgeon: Donnald Charleston, MD;  Location[facility] ENDOSCOPY;  Service: Endoscopy;  Laterality: N/A;   CORONARY ARTERY BYPASS GRAFT N/A 03/09/2023   Procedure: CORONARY ARTERY BYPASS GRAFTING (CABG) TIMES THREE USING THE LEFT INTERNAL MAMMARY ARTERY (LIMA) AND ENDOSCOPICALLY HARVESTED RIGHT GREATER SAPHENOUS VEIN;  Surgeon: Lucas Dorise POUR, MD;  Location: [facility] OR;  Service: Open Heart Surgery;  Laterality: N/A;   ESOPHAGOGASTRODUODENOSCOPY (EGD) WITH  PROPOFOL  N/A 06/20/2018   Procedure: ESOPHAGOGASTRODUODENOSCOPY (EGD) WITH PROPOFOL ;  Surgeon: Rosalie Kitchens, MD;  Location: [facility] ENDOSCOPY;  Service: Endoscopy;  Laterality: N/A;   IR THORACENTESIS ASP PLEURAL SPACE W/IMG GUIDE  03/16/2023   LUNG SURGERY     PACEMAKER IMPLANT N/A 03/03/2022   Procedure: PACEMAKER IMPLANT;  Surgeon: Waddell Danelle ORN, MD;  Location: [facility] CV [facility];  Service: Cardiovascular;  Laterality: N/A;   RIGHT HEART CATH N/A 03/06/2023   Procedure: RIGHT HEART CATH;  Surgeon: Rolan Ezra RAMAN, MD;  Location: [facility] INVASIVE CV LAB;  Service: Cardiovascular;  Laterality: N/A;   RIGHT/LEFT HEART CATH AND CORONARY ANGIOGRAPHY N/A 03/03/2023   Procedure: RIGHT/LEFT HEART CATH AND CORONARY ANGIOGRAPHY;  Surgeon: Anner Alm ORN, MD;  Location: [facility] INVASIVE CV LAB;  Service: Cardiovascular;  Laterality: N/A;   TEE WITHOUT CARDIOVERSION N/A 03/09/2023   Procedure: TRANSESOPHAGEAL ECHOCARDIOGRAM;  Surgeon: Lucas Dorise POUR, MD;  Location: [facility] OR;  Service: Open Heart Surgery;  Laterality: N/A;    Family History  Problem Relation Age of Onset   Heart attack Mother    Heart attack Father    Diabetes Sister    High blood pressure Neg Hx    High Cholesterol Neg Hx    Social History   Occupational History   Not on file  Tobacco Use   Smoking status: Never   Smokeless tobacco: Never  Vaping Use   Vaping status: Never Used  Substance and Sexual Activity   Alcohol  use: No   Drug use: No   Sexual activity: Not Currently    Tobacco Counseling Pt is nonsmoker and does not drink ETOH  Immunizations and Health Maintenance Immunization History  Administered Date(s) Administered   Influenza, Seasonal, Injecte, Preservative Fre 05/18/2023   Influenza,inj,Quad PF,6+ Mos 04/13/2019, 03/19/2021, 02/06/2022   PFIZER(Purple Top)SARS-COV-2 Vaccination 09/13/2019, 10/04/2019, 03/14/2020, 04/10/2020   Pneumococcal Conjugate (Pcv15) 06/18/2021   Pneumococcal Polysaccharide-23 05/06/2018   Tdap  07/21/2022   Zoster Recombinant(Shingrix ) 10/17/2021, 02/06/2022   Health Maintenance Due  Topic Date Due   COVID-19 Vaccine (5 - 2024-25 season) 02/15/2023   Diabetic kidney evaluation - Urine ACR  11/18/2023  Reports having had COVID booster in fall of last yr at [facility]  Activities of Daily Living    12/08/2023   10:03 AM 03/02/2023   11:22 AM  In your present state of health, do you have any difficulty performing the following activities:  Hearing? 0 0  Vision? 1 0  Difficulty concentrating or making decisions? 1 0  Comment Sister is advising patient she is forgetful at times   Walking or climbing stairs? 1 1  Dressing or bathing? 1 0  Comment At times - painful L leg since fall in 2024   Doing errands, shopping? 0 0  Preparing Food and eating ? N   Using the Toilet? N   In the past six months, have you accidently leaked urine? Y   Comment Only when sneezing   Do you have problems with loss of bowel control? N   Managing your Medications? N   Managing your Finances? N   Housekeeping or managing your Housekeeping? N     Physical Exam   Physical Exam (optional), or other factors deemed appropriate based on the beneficiary's medical and social history and current clinical standards.   Advanced Directives: Does Patient Have a Medical Advance Directive?: No Would patient like information on creating a medical advance directive?: Yes (MAU/Ambulatory/Procedural Areas - Information given)  EKG:  Pt declines.  Has old EKG in system      Assessment:     Vision/Hearing screen Vision Screening   Right eye Left eye Both eyes  Without correction 20/70 20/200 20/40  With correction     Pt has DM retinopathy and sees retina specialist Dr. Elner. Whisper test is normal on both sides.  Goals        Diabetes Patient stated goal (pt-stated)      Wanting to get DM better controlled.        Depression Screen    12/08/2023   10:10 AM 12/08/2023   10:02 AM  10/06/2023   10:20 AM 05/18/2023    3:49 PM  PHQ 2/9 Scores  PHQ - 2 Score 1 1 0 0  PHQ- 9 Score 5  2 2    Reports depression is not a major issue at this time.  Denies SI/HI  Fall Risk  12/08/2023   10:02 AM  Fall Risk   Falls in the past year? 1  Number falls in past yr: 0  Injury with Fall? 1  Risk for fall due to : Other (Comment)  Risk for fall due to: Comment Fall in 2024 during snow storm  Follow up Falls evaluation completed  Ambulate with rollator walker.  Cognitive Function:    12/08/2023   10:10 AM 07/21/2022    3:52 PM  MMSE - Mini Mental State Exam  Orientation to time 4 4  Orientation to Place 5 5  Registration 3 3  Attention/ Calculation 5 5  Recall 1 2  Language- name 2 objects 1 2  Language- repeat 1 1  Language- follow 3 step command 3 3  Language- read & follow direction 1 1  Write a sentence 1 1  Copy design 1 1  Total score 26 28        Patient Care Team: Vicci Barnie NOVAK, MD as PCP - General (Internal Medicine) Mona Vinie BROCKS, MD as PCP - Cardiology (Cardiology) Cindie Ole DASEN, MD as PCP - Electrophysiology (Cardiology) Brannock, Wanda SQUIBB, RN [facility], Jon Garre, PA as Physician Assistant (Cardiology)     Plan:   1. Encounter for Medicare annual wellness exam (Primary)   2. Advance directive discussed with patient Patient given a packet.  Advised that if she executes a living will or healthcare power of attorney, she should bring a copy for our records.  3. Abnormal mini-mental status exam Patient scored 26 out of 30.  We will plan to repeat this in 1 year.  She denies any issues with functioning at home at this time.   I have personally reviewed and noted the following in the patient's chart:   Medical and social history Use of alcohol , tobacco or illicit drugs  Current medications and supplements including opioid prescriptions. Patient is not currently taking opioid prescriptions. Functional ability and  status Nutritional status Physical activity Advanced directives List of other physicians Hospitalizations, surgeries, and ER visits in previous 12 months Vitals Screenings to include cognitive, depression, and falls Referrals and appointments  In addition, I have reviewed and discussed with patient certain preventive protocols, quality metrics, and best practice recommendations. A written personalized care plan for preventive services as well as general preventive health recommendations were provided to patient.     Barnie Vicci, MD 12/08/2023

## 2023-12-09 NOTE — Telephone Encounter (Signed)
 Thanks [employer]. Clarisa, please let pt know if she was not informed of this prior to her discharge yesterday.

## 2023-12-14 DIAGNOSIS — H02833 Dermatochalasis of right eye, unspecified eyelid: Secondary | ICD-10-CM | POA: Diagnosis not present

## 2023-12-14 DIAGNOSIS — H35033 Hypertensive retinopathy, bilateral: Secondary | ICD-10-CM | POA: Diagnosis not present

## 2023-12-14 DIAGNOSIS — E113511 Type 2 diabetes mellitus with proliferative diabetic retinopathy with macular edema, right eye: Secondary | ICD-10-CM | POA: Diagnosis not present

## 2023-12-14 DIAGNOSIS — H353112 Nonexudative age-related macular degeneration, right eye, intermediate dry stage: Secondary | ICD-10-CM | POA: Diagnosis not present

## 2023-12-14 DIAGNOSIS — H43813 Vitreous degeneration, bilateral: Secondary | ICD-10-CM | POA: Diagnosis not present

## 2023-12-14 DIAGNOSIS — H02836 Dermatochalasis of left eye, unspecified eyelid: Secondary | ICD-10-CM | POA: Diagnosis not present

## 2023-12-15 NOTE — Telephone Encounter (Signed)
 Called & spoke to the patient. Verified name & DOB. Confirmed with patient that she currently does have: - Eliquis  - Empagliflozin  - Fenofibrate  - Ferrous Sulfate  - Torsemide    Patient confirmed that she recently received a shipment Monday 12/14/2023 with the following medications.  - Freestyle Libre supplies - Humalog  - Lantus  - Mounjaro  - Pen needles   Patient stated that she is needing pump supplies. Advised to reach out to [facility] for help with that. Patient expressed verbal understanding.

## 2023-12-25 ENCOUNTER — Encounter: Payer: Self-pay | Admitting: Internal Medicine

## 2023-12-25 ENCOUNTER — Other Ambulatory Visit: Payer: Self-pay

## 2023-12-25 ENCOUNTER — Ambulatory Visit (INDEPENDENT_AMBULATORY_CARE_PROVIDER_SITE_OTHER): Admitting: Internal Medicine

## 2023-12-25 ENCOUNTER — Other Ambulatory Visit (HOSPITAL_COMMUNITY): Payer: Self-pay

## 2023-12-25 VITALS — BP 128/82 | HR 83 | Ht 63.0 in | Wt 187.0 lb

## 2023-12-25 DIAGNOSIS — E1165 Type 2 diabetes mellitus with hyperglycemia: Secondary | ICD-10-CM | POA: Diagnosis not present

## 2023-12-25 DIAGNOSIS — Z794 Long term (current) use of insulin: Secondary | ICD-10-CM

## 2023-12-25 DIAGNOSIS — E1159 Type 2 diabetes mellitus with other circulatory complications: Secondary | ICD-10-CM | POA: Diagnosis not present

## 2023-12-25 DIAGNOSIS — E1142 Type 2 diabetes mellitus with diabetic polyneuropathy: Secondary | ICD-10-CM

## 2023-12-25 LAB — POCT GLYCOSYLATED HEMOGLOBIN (HGB A1C): Hemoglobin A1C: 7.6 % — AB (ref 4.0–5.6)

## 2023-12-25 MED ORDER — TIRZEPATIDE 10 MG/0.5ML ~~LOC~~ SOAJ: 10.0000 mg | SUBCUTANEOUS | 3 refills | Status: DC

## 2023-12-25 MED ORDER — FLUTICASONE PROPIONATE 50 MCG/ACT NA SUSP: 2.0000 | Freq: Every day | NASAL | 6 refills | Status: DC

## 2023-12-25 MED ORDER — OMNIPOD 5 G7 PODS (GEN 5) MISC
1.0000 | 3 refills | Status: AC
  Filled 2023-12-25: qty 30, 90d supply, fill #0
  Filled 2024-01-27: qty 45, 90d supply, fill #0
  Filled 2024-04-22: qty 45, 90d supply, fill #1
  Filled 2024-04-22: qty 45, 90d supply, fill #0

## 2023-12-25 MED ORDER — OMNIPOD 5 G7 PODS (GEN 5) MISC
1.0000 | 3 refills | Status: DC
Start: 2023-12-25 — End: 2023-12-25

## 2023-12-25 MED ORDER — DEXCOM G7 SENSOR MISC: 1.0000 | 3 refills | Status: DC

## 2023-12-25 MED ORDER — DEXCOM G7 SENSOR MISC
1.0000 | 3 refills | Status: AC
  Filled 2023-12-25 (×2): qty 9, 90d supply, fill #0
  Filled 2024-04-25: qty 9, 90d supply, fill #1

## 2023-12-25 NOTE — Progress Notes (Signed)
 Name: Emily Farmer  Age/ Sex: 68 y.o., female   MRN/ DOB: [id number], Nov 27, 1955     PCP: Vicci Barnie NOVAK, MD   Reason for Endocrinology Evaluation: Type 2 Diabetes Mellitus  Initial Endocrine Consultative Visit: 09/26/2019    PATIENT IDENTIFIER: Emily Farmer is a 68 y.o. female with a past medical history of DM, HTN, A.Fib , S/P pacemaker, CAD(S/P CABG 2024)   and dyslipidemia. The patient has followed with Endocrinology clinic since 09/26/2019 for consultative assistance with management of her diabetes.  DIABETIC HISTORY:  Emily Farmer was diagnosed with DM in 2013, has been on insulin  and metformin  since diagnosis. Her hemoglobin A1c has ranged from 9.5% in 2020, peaking at 12.2% in 2019    On initial visit to our clinic, her A1c was 12.4% . She was on Lantus  and metformin  and we added Novolog     Stopped metformin  05/2021 and started Jardiance   Trulicity  caused diarrhea   Lives with sister    She was trained on Dexcom 09/2022  Discontinue pioglitazone  06/2023 due to lower extremity edema   She was trained on the Omnipod/16/2025  SUBJECTIVE:   During the last visit (09/22/2023): A1c 7.5%   Today (12/25/2023): Emily Farmer is here for a follow up on diabetes management.  She checks her blood sugars 2-3 time a day . The patient has not had hypoglycemic episodes since the last clinic visit.    She continues to follow-up with cardiology for CAD, s/p CABG x 3 02/2023.  As well as s/p ablation in 2024   Denies nausea or vomiting  Denies  diarrhea but has mild constipation   This patient with type 2 diabetes is treated with OmniPod (insulin  pump). During the visit the pump basal and bolus doses were reviewed including carb/insulin  rations and supplemental doses. The clinical list was updated. The glucose meter download was reviewed in detail to determine if the current pump settings are providing the best glycemic control without excessive hypoglycemia.  Pump and meter  download:    Pump   OmniPod Settings   Insulin  type   Humalog    Basal rate       0000 0.6 u/h               I:C ratio       0000 1:1                  Sensitivity       0000  40      Goal       0000  120            Type & Model of Pump: OmniPod Insulin  Type: Currently using Humalog .  Body mass index is 33.13 kg/m.  PUMP STATISTICS: Average BG: 163  Average Daily Carbs (g): 5.4  Average Total Daily Insulin : 20.2  Average Daily Basal: 13.4 (66 %) Average Daily Bolus: 6.8 (34 %)      HOME DIABETES REGIMEN:  Jardiance  25 mg daily Mounjaro  7.5 mg weekly Humalog      Statin: Yes ACE-I/ARB: no   CONTINUOUS GLUCOSE MONITORING RECORD INTERPRETATION    Dates of Recording: 6/28-7/04/2024  Sensor description: Dexcom  Results statistics:   CGM use % of time 35.4  Average and SD 163/45  Time in range 68  %  % Time Above 180 27  % Time above 250 5  % Time Below target 0   Glycemic patterns summary: BGs are optimal throughout the night and most of  the day  Hyperglycemic episodes postprandial  Hypoglycemic episodes occurred N/A  Overnight periods: Optimal    DIABETIC COMPLICATIONS: Microvascular complications:  Fluctuating GFR, cataract, neuropathy  Last eye exam: Completed 09/28/2023 Macrovascular complications:    Denies: CAD, PVD, CVA      HISTORY:  Past Medical History:  Past Medical History:  Diagnosis Date   Arthritis    Atrial fibrillation (HCC)    a. 09/2016 in setting of pancreatitis;  b. 09/2016 Echo: EF 65-60%, no rwma, Gr2 DD, mild MR, triv TR, PASP ;  CHA2DS2VASc = 4-->Eliquis  5mg  BID. c. Recurred 06/2018 in setting of GIB.   Chronic diastolic CHF (congestive heart failure) (HCC) 01/28/2018   Diabetes mellitus    GI bleeding 06/2018   Gout    Hyperlipidemia    Hypertension    Hypertriglyceridemia    Pancreatitis    a. 09/2016 - Triglycerides 1,392 on admission.   Past Surgical History:  Past Surgical History:   Procedure Laterality Date   AV NODE ABLATION N/A 01/02/2023   Procedure: AV NODE ABLATION;  Surgeon: Waddell Danelle ORN, MD;  Location: [facility] INVASIVE CV LAB;  Service: Cardiovascular;  Laterality: N/A;   BIOPSY  06/20/2018   Procedure: BIOPSY;  Surgeon: Rosalie Kitchens, MD;  Location: [facility] ENDOSCOPY;  Service: Endoscopy;;   CHOLECYSTECTOMY N/A 01/04/2014   Procedure: LAPAROSCOPIC CHOLECYSTECTOMY WITH INTRAOPERATIVE CHOLANGIOGRAM;  Surgeon: Lynda Leos, MD;  Location: [facility] OR;  Service: General;  Laterality: N/A;   COLONOSCOPY WITH PROPOFOL  N/A 06/21/2018   Procedure: COLONOSCOPY WITH PROPOFOL ;  Surgeon: Donnald Charleston, MD;  Location: [facility] ENDOSCOPY;  Service: Endoscopy;  Laterality: N/A;   CORONARY ARTERY BYPASS GRAFT N/A 03/09/2023   Procedure: CORONARY ARTERY BYPASS GRAFTING (CABG) TIMES THREE USING THE LEFT INTERNAL MAMMARY ARTERY (LIMA) AND ENDOSCOPICALLY HARVESTED RIGHT GREATER SAPHENOUS VEIN;  Surgeon: Lucas Dorise POUR, MD;  Location: [facility] OR;  Service: Open Heart Surgery;  Laterality: N/A;   ESOPHAGOGASTRODUODENOSCOPY (EGD) WITH PROPOFOL  N/A 06/20/2018   Procedure: ESOPHAGOGASTRODUODENOSCOPY (EGD) WITH PROPOFOL ;  Surgeon: Rosalie Kitchens, MD;  Location: [facility] ENDOSCOPY;  Service: Endoscopy;  Laterality: N/A;   IR THORACENTESIS ASP PLEURAL SPACE W/IMG GUIDE  03/16/2023   LUNG SURGERY     PACEMAKER IMPLANT N/A 03/03/2022   Procedure: PACEMAKER IMPLANT;  Surgeon: Waddell Danelle ORN, MD;  Location: [facility] CV LAB;  Service: Cardiovascular;  Laterality: N/A;   RIGHT HEART CATH N/A 03/06/2023   Procedure: RIGHT HEART CATH;  Surgeon: Rolan Ezra RAMAN, MD;  Location: [facility] INVASIVE CV LAB;  Service: Cardiovascular;  Laterality: N/A;   RIGHT/LEFT HEART CATH AND CORONARY ANGIOGRAPHY N/A 03/03/2023   Procedure: RIGHT/LEFT HEART CATH AND CORONARY ANGIOGRAPHY;  Surgeon: Anner Alm ORN, MD;  Location: [facility] INVASIVE CV LAB;  Service: Cardiovascular;  Laterality: N/A;   TEE WITHOUT CARDIOVERSION N/A 03/09/2023   Procedure:  TRANSESOPHAGEAL ECHOCARDIOGRAM;  Surgeon: Lucas Dorise POUR, MD;  Location: [facility] OR;  Service: Open Heart Surgery;  Laterality: N/A;   Social History:  reports that she has never smoked. She has never used smokeless tobacco. She reports that she does not drink alcohol  and does not use drugs. Family History:  Family History  Problem Relation Age of Onset   Heart attack Mother    Heart attack Father    Diabetes Sister    High blood pressure Neg Hx    High Cholesterol Neg Hx      HOME MEDICATIONS: Allergies as of 12/25/2023       Reactions   Trulicity  [dulaglutide ] Diarrhea  Medication List        Accurate as of December 25, 2023 10:08 AM. If you have any questions, ask your nurse or doctor.          Accu-Chek Guide Me w/Device Kit Use to check blood sugar 3 times daily.   Accu-Chek Guide Test test strip Generic drug: glucose blood Use as instructed   Accu-Chek Softclix Lancets lancets Use to check blood sugar 3 times daily.   acetaminophen  325 MG tablet Commonly known as: TYLENOL  Take 2 tablets (650 mg total) by mouth every 6 (six) hours as needed for mild pain (or Fever >/= 101).   apixaban  5 MG Tabs tablet Commonly known as: ELIQUIS  Take 1 tablet (5 mg total) by mouth in the morning and at bedtime.   aspirin  EC 81 MG tablet Take 1 tablet (81 mg total) by mouth daily. Swallow whole.   atorvastatin  40 MG tablet Commonly known as: LIPITOR Take 1 tablet (40 mg total) by mouth daily.   Blood Pressure Monitor/Arm Devi Check blood pressure once per day at least 2 hours after medications.   brimonidine  0.2 % ophthalmic solution Commonly known as: ALPHAGAN  Instill 1 drop into both eyes twice a day   CENTRUM SILVER  PO Take 1 tablet by mouth daily.   Ocuvite Adult 50+ Caps Take 1 capsule by mouth daily.   Dexcom G7 Sensor Misc 1 Device by Does not apply route as directed.   FreeStyle Libre 2 Plus Sensor Misc Use as directred to test blood sugar. Change  sensor every 14 days .   diclofenac  Sodium 1 % Gel Commonly known as: Voltaren  Apply 2 g topically 4 (four) times daily. Apply across lower back for back pain.   Droplet Pen Needles 32G X 4 MM Misc Generic drug: Insulin  Pen Needle Use in the morning, at noon, in the evening, and at bedtime.   fenofibrate  48 MG tablet Commonly known as: Tricor  Take 1 tablet (48 mg total) by mouth daily.   FeroSul 325 (65 Fe) MG tablet Generic drug: ferrous sulfate  Take 1 tablet (325 mg total) by mouth 2 (two) times a week.   insulin  lispro 100 UNIT/ML KwikPen Commonly known as: HumaLOG  KwikPen Inject 6 Units into the skin 3 (three) times daily. Max daily 45 units   insulin  lispro 100 UNIT/ML injection Commonly known as: HumaLOG  Inject 36 units per day via insulin  pump   Insulin  Syringe-Needle U-100 31G X 5/16 0.3 ML Misc Commonly known as: TRUEplus Insulin  Syringe Use to inject insulin  daily. What changed:  how much to take how to take this when to take this   Jardiance  25 MG Tabs tablet Generic drug: empagliflozin  Take 1 tablet (25 mg total) by mouth daily before breakfast.   Lantus  SoloStar 100 UNIT/ML Solostar Pen Generic drug: insulin  glargine Inject 16 Units into the skin daily.   Mounjaro  7.5 MG/0.5ML Pen Generic drug: tirzepatide  Inject 7.5 mg into the skin once a week.   Omnipod 5 Libre2 Plus G6 Pods Misc 1 each by Does not apply route every 3 (three) days.   torsemide  20 MG tablet Commonly known as: DEMADEX  Take 1 tablet (20 mg) once a day, Take extra dose of 20 mg as needed for sudden weight gain and leg swelling         OBJECTIVE:   Vital Signs: BP 128/82 (BP Location: Left Arm, Patient Position: Sitting, Cuff Size: Normal)   Pulse 83   Ht 5' 3 (1.6 m)   Wt 187 lb (84.8  kg)   SpO2 93%   BMI 33.13 kg/m   Wt Readings from Last 3 Encounters:  12/25/23 187 lb (84.8 kg)  12/08/23 177 lb (80.3 kg)  11/16/23 180 lb 9.6 oz (81.9 kg)     Exam: General: Pt  appears well and is in NAD  Lungs: Clear with good BS bilat   Heart: RRR  Extremities: No pretibial edema.   Neuro: MS is good with appropriate affect, pt is alert and Ox3   DM foot exam: 06/24/2023   The skin of the feet is intact without sores or ulcerations,with thickened yellow toe nail The pedal pulses are undetectable  The sensation is decreased  to a screening 5.07, 10 gram monofilament bilaterally  DATA REVIEWED:  Lab Results  Component Value Date   HGBA1C 7.6 (A) 12/25/2023   HGBA1C 7.5 (A) 09/22/2023   HGBA1C 9.1 (A) 06/24/2023        ASSESSMENT / PLAN / RECOMMENDATIONS:   1) Type 2 Diabetes Mellitus, suboptimally controlled, with neuropathic and macrovascular complications - Most recent A1c of 7.6 %   -A1c is stable but continues to be above goal -She is intolerant to Trulicity   -Intolerant to pioglitazone  due to lower extremity edema - Tolerating Mounjaro , will increase the dose - I will also reduce her basal insulin  due to tight BG's - She was encouraged to enter 2 g of carbohydrates with each meal - Discussed the importance of staying in the automatic mode   MEDICATIONS:  -Continue Jardiace 25 mg daily  -Increase Mounjaro  10 mg weekly    Pump   OmniPod Settings   Insulin  type   Humalog    Basal rate       0000 0.55 u/h               I:C ratio       0000 1:1    Enter 2 g with each meal               Sensitivity       0000  40      Goal       0000  120           EDUCATION / INSTRUCTIONS: BG monitoring instructions: Patient is instructed to check her blood sugars 3 times a day, before meals. I reviewed the Rule of 15 for the treatment of hypoglycemia in detail with the patient. Literature supplied.    2) Diabetic complications:  Eye: Does not have known diabetic retinopathy.  Neuro/ Feet: Does have known diabetic peripheral neuropathy. Renal: Patient does have known baseline CKD. She is not on an ACEI/ARB at present.   3)  Dyslipidemia :  -Patient on atorvastatin  40 mg daily   F/U in 4 months   Signed electronically by: Stefano Redgie Butts, MD  [facility]  [facility] [address] [place] [place], [place] [address] Phone: [phone] FAX: [phone]   CC: Vicci Barnie NOVAK, MD [address] [place] [address] [place] [address] Phone: [phone]  Fax: [phone]  Return to Endocrinology clinic as below: Future Appointments  Date Time Provider Department Center  12/25/2023 10:30 AM Aldridge Krzyzanowski, Donell Redgie, MD [facility] None  02/16/2024 10:30 AM Vicci Barnie NOVAK, MD [facility] None

## 2023-12-25 NOTE — Patient Instructions (Signed)
-   Increase Mounjaro  10 mg weekly - Continue Jardiance  25 mg daily   Enter 2 g of carbohydrates with each meal    HOW TO TREAT LOW BLOOD SUGARS (Blood sugar LESS THAN 70 MG/DL) Please follow the RULE OF 15 for the treatment of hypoglycemia treatment (when your (blood sugars are less than 70 mg/dL)   STEP 1: Take 15 grams of carbohydrates when your blood sugar is low, which includes:  3-4 GLUCOSE TABS  OR 3-4 OZ OF JUICE OR REGULAR SODA OR ONE TUBE OF GLUCOSE GEL    STEP 2: RECHECK blood sugar in 15 MINUTES STEP 3: If your blood sugar is still low at the 15 minute recheck --> then, go back to STEP 1 and treat AGAIN with another 15 grams of carbohydrates.

## 2023-12-26 ENCOUNTER — Other Ambulatory Visit (HOSPITAL_COMMUNITY): Payer: Self-pay

## 2023-12-31 ENCOUNTER — Other Ambulatory Visit (HOSPITAL_COMMUNITY): Payer: Self-pay

## 2024-01-01 ENCOUNTER — Other Ambulatory Visit: Payer: Self-pay

## 2024-01-04 ENCOUNTER — Other Ambulatory Visit: Payer: Self-pay

## 2024-01-04 ENCOUNTER — Other Ambulatory Visit (HOSPITAL_COMMUNITY): Payer: Self-pay

## 2024-01-04 DIAGNOSIS — E1165 Type 2 diabetes mellitus with hyperglycemia: Secondary | ICD-10-CM | POA: Diagnosis not present

## 2024-01-05 DIAGNOSIS — H353124 Nonexudative age-related macular degeneration, left eye, advanced atrophic with subfoveal involvement: Secondary | ICD-10-CM | POA: Diagnosis not present

## 2024-01-05 DIAGNOSIS — H43813 Vitreous degeneration, bilateral: Secondary | ICD-10-CM | POA: Diagnosis not present

## 2024-01-05 DIAGNOSIS — E113511 Type 2 diabetes mellitus with proliferative diabetic retinopathy with macular edema, right eye: Secondary | ICD-10-CM | POA: Diagnosis not present

## 2024-01-05 DIAGNOSIS — H35033 Hypertensive retinopathy, bilateral: Secondary | ICD-10-CM | POA: Diagnosis not present

## 2024-01-05 LAB — HM DIABETES EYE EXAM

## 2024-01-11 ENCOUNTER — Other Ambulatory Visit: Payer: Self-pay

## 2024-01-11 ENCOUNTER — Other Ambulatory Visit (HOSPITAL_COMMUNITY): Payer: Self-pay

## 2024-01-12 ENCOUNTER — Other Ambulatory Visit: Payer: Self-pay

## 2024-01-13 DIAGNOSIS — E113512 Type 2 diabetes mellitus with proliferative diabetic retinopathy with macular edema, left eye: Secondary | ICD-10-CM | POA: Diagnosis not present

## 2024-01-13 DIAGNOSIS — H353112 Nonexudative age-related macular degeneration, right eye, intermediate dry stage: Secondary | ICD-10-CM | POA: Diagnosis not present

## 2024-01-13 DIAGNOSIS — H353124 Nonexudative age-related macular degeneration, left eye, advanced atrophic with subfoveal involvement: Secondary | ICD-10-CM | POA: Diagnosis not present

## 2024-01-13 DIAGNOSIS — H43813 Vitreous degeneration, bilateral: Secondary | ICD-10-CM | POA: Diagnosis not present

## 2024-01-13 DIAGNOSIS — H35033 Hypertensive retinopathy, bilateral: Secondary | ICD-10-CM | POA: Diagnosis not present

## 2024-01-14 ENCOUNTER — Other Ambulatory Visit: Payer: Self-pay

## 2024-01-15 NOTE — Procedures (Signed)
Mask fit

## 2024-01-27 ENCOUNTER — Telehealth: Payer: Self-pay | Admitting: Nutrition

## 2024-01-27 ENCOUNTER — Other Ambulatory Visit: Payer: Self-pay

## 2024-01-27 DIAGNOSIS — E113512 Type 2 diabetes mellitus with proliferative diabetic retinopathy with macular edema, left eye: Secondary | ICD-10-CM | POA: Diagnosis not present

## 2024-01-27 NOTE — Telephone Encounter (Unsigned)
 Patient left message on my machine saying she put her last pod on and would like you to send a new script to the [facility]

## 2024-01-28 ENCOUNTER — Other Ambulatory Visit: Payer: Self-pay

## 2024-02-01 ENCOUNTER — Other Ambulatory Visit: Payer: Self-pay

## 2024-02-01 NOTE — Telephone Encounter (Signed)
 Spoke with patient and she has already received shipment for pods.

## 2024-02-12 ENCOUNTER — Telehealth: Payer: Self-pay | Admitting: Internal Medicine

## 2024-02-12 NOTE — Telephone Encounter (Signed)
 Called patient, no answer. Left voicemail confirming upcoming appointment on 02/16/2024 Provided callback number for any questions or changes.

## 2024-02-16 ENCOUNTER — Encounter: Payer: Self-pay | Admitting: Internal Medicine

## 2024-02-16 ENCOUNTER — Ambulatory Visit: Attending: Internal Medicine | Admitting: Internal Medicine

## 2024-02-16 VITALS — BP 120/77 | HR 88 | Temp 97.8°F | Ht 63.0 in | Wt 181.0 lb

## 2024-02-16 DIAGNOSIS — I2510 Atherosclerotic heart disease of native coronary artery without angina pectoris: Secondary | ICD-10-CM | POA: Diagnosis not present

## 2024-02-16 DIAGNOSIS — Z23 Encounter for immunization: Secondary | ICD-10-CM | POA: Diagnosis not present

## 2024-02-16 DIAGNOSIS — I480 Paroxysmal atrial fibrillation: Secondary | ICD-10-CM

## 2024-02-16 DIAGNOSIS — E1159 Type 2 diabetes mellitus with other circulatory complications: Secondary | ICD-10-CM | POA: Diagnosis not present

## 2024-02-16 DIAGNOSIS — Z7985 Long-term (current) use of injectable non-insulin antidiabetic drugs: Secondary | ICD-10-CM

## 2024-02-16 DIAGNOSIS — Z7984 Long term (current) use of oral hypoglycemic drugs: Secondary | ICD-10-CM

## 2024-02-16 DIAGNOSIS — E66811 Obesity, class 1: Secondary | ICD-10-CM | POA: Diagnosis not present

## 2024-02-16 DIAGNOSIS — Z794 Long term (current) use of insulin: Secondary | ICD-10-CM

## 2024-02-16 DIAGNOSIS — I509 Heart failure, unspecified: Secondary | ICD-10-CM | POA: Diagnosis not present

## 2024-02-16 DIAGNOSIS — N1832 Chronic kidney disease, stage 3b: Secondary | ICD-10-CM | POA: Diagnosis not present

## 2024-02-16 DIAGNOSIS — D509 Iron deficiency anemia, unspecified: Secondary | ICD-10-CM

## 2024-02-16 DIAGNOSIS — E119 Type 2 diabetes mellitus without complications: Secondary | ICD-10-CM

## 2024-02-16 NOTE — Patient Instructions (Signed)
 VISIT SUMMARY:  Today, you had a follow-up appointment to review your chronic medical conditions, including diabetes, heart disease, heart failure, atrial fibrillation, and kidney disease. We discussed your current medications, recent lab results, and any symptoms you have been experiencing.  YOUR PLAN:  -TYPE 2 DIABETES MELLITUS WITH DIABETIC RETINOPATHY: Type 2 diabetes is a condition where your body does not use insulin  properly, leading to high blood sugar levels. Diabetic retinopathy is an eye condition caused by damage to the blood vessels in the retina due to high blood sugar. Your last A1c was 7.6, and you are currently taking Mounjaro  10 mg, Jardiance  25 mg daily, and using an insulin  pump. Your continuous glucose monitor shows that your blood sugar is in range 81% of the time. Continue with your current medications and monitor your blood glucose levels. Try to reduce sugary snacks and drinks. Your endocrinologist may increase your Mounjaro  dosage at your next visit.  -ATHEROSCLEROTIC HEART DISEASE OF NATIVE CORONARY ARTERY: Atherosclerotic heart disease is a condition where the arteries that supply blood to your heart become hardened and narrowed due to plaque buildup. You have not experienced chest pain during exercise, but you do have occasional sharp pain after exercise. Your blood pressure is well-controlled at 120/77. Continue taking atorvastatin  40 mg daily and aspirin  81 mg daily.  -HEART FAILURE: Heart failure is a condition where your heart does not pump blood as well as it should. You have not had increased shortness of breath or significant leg swelling. Continue taking torsemide  20 mg daily.  -PAROXYSMAL ATRIAL FIBRILLATION: Paroxysmal atrial fibrillation is a type of irregular heartbeat that comes and goes. You are managing this condition with Eliquis  and have not reported any bleeding or bruising. Continue taking Eliquis  as prescribed.  -CHRONIC KIDNEY DISEASE STAGE 3: Chronic  kidney disease stage 3 means your kidneys are moderately damaged and not working as well as they should. Your last kidney function test was in April. We will order a metabolic panel to assess your kidney function.  -ANEMIA: Anemia is a condition where you do not have enough healthy red blood cells to carry adequate oxygen to your body's tissues. Your iron levels have improved, and you are currently taking an iron supplement twice a week. We will check your blood count to monitor your anemia status.  -GENERAL HEALTH MAINTENANCE: You are due for a flu vaccination and a COVID-19 vaccination. We will administer the flu shot today, and you should obtain the COVID-19 vaccine at an outside pharmacy.  INSTRUCTIONS:  Please schedule a follow-up appointment in four months. If you experience any problems before then, contact our office to be seen sooner.

## 2024-02-16 NOTE — Progress Notes (Signed)
 Patient ID: Emily Farmer, female    DOB: [birth year]  MRN: [id number]  CC: Diabetes (DM f/u. /No questions / concerns/Yes to flu vax )   Subjective: Emily Farmer is a 68 y.o. female who presents for chronic ds management. 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  History of Present Illness Emily Farmer is a 68 year old female with diabetes, heart disease, congestive heart failure, atrial fibrillation, and kidney disease who presents for follow-up on her chronic medical issues.  DM: She has been managing her diabetes with an increased dose of Mounjaro  from 7.5 mg to 10 mg, as prescribed by her endocrinologist in July. She continues to take Jardiance  25 mg daily and uses an insulin  pump. Her last A1c was 7.6. She uses a continuous glucose monitor attached to her insulin  pump, which shows she is in range 81% of the time, with 19% high readings and no low readings. She is trying to avoid sugary snacks and drinks. She reports that she gets full but becomes hungry again about one to two hours later, and she has not experienced significant weight loss so far on Mounjaro . She is still receiving treatment for retinopathy in her eyes.  CAD/CHFrEF/PAF: Regarding her heart disease and congestive heart failure, she continues to take atorvastatin  40 mg daily, Eliquis , and aspirin  81 mg. No bleeding or bruising on Eliquis , no chest pain during exercise, and no  increased shortness of breath. She experiences sharp chest pain after exercise but not during. She has not used nitroglycerin  and reports some leg swelling at times but not currently.  CKD stage 3: Her kidney function was last checked in April, and she is due for a follow-up test.   She also has a history of anemia, for which she takes iron supplements twice a week. Her iron studies had improved in April.      Patient Active Problem List   Diagnosis Date Noted   Type 2 diabetes mellitus with hyperglycemia, with long-term current use of insulin  (HCC) 06/24/2023   Diabetes mellitus (HCC) 06/24/2023   Permanent atrial fibrillation (HCC) 03/17/2023   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/2023   Coronary artery disease 03/09/2023   Coronary artery disease involving native coronary artery of native heart with unstable angina pectoris (HCC) 03/05/2023   Accelerating angina (HCC) 03/01/2023   Stage 3b chronic kidney disease (HCC) 11/18/2022   Proliferative diabetic retinopathy of both eyes (HCC) 10/29/2022   Peripheral arterial disease (HCC) 10/14/2022   Polyneuropathy associated with underlying disease (HCC) 08/22/2022   Type 2 diabetes mellitus with peripheral neuropathy (HCC) 08/22/2022   Pacemaker 06/03/2022   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   Bacteremia 04/01/2021   Atrial fibrillation with slow ventricular response (HCC) 03/31/2021   Pulmonary hypertension (HCC) 03/19/2021   Hypomagnesemia    Sepsis (HCC) 02/27/2021   Atrial fibrillation with rapid ventricular response (HCC) 02/27/2021   Hypotension 02/27/2021   Secondary hypercoagulable state (HCC) 05/08/2020   Dyslipidemia 09/23/2019   Toenail fungus 07/19/2018  Depression 07/19/2018   Iron deficiency anemia due to chronic blood loss 07/19/2018   Gastritis and duodenitis 07/19/2018   Chronic a-fib (HCC) 01/28/2018   Hyponatremia 01/28/2018   Chronic diastolic CHF (congestive heart failure) (HCC) 01/28/2018   Mixed  hyperlipidemia 06/01/2017   Paroxysmal A-fib (HCC) 10/04/2016   Hypokalemia 01/04/2014   Acute pyelonephritis 02/15/2013   Essential hypertension 02/15/2013   Hypertriglyceridemia 02/15/2013   Coronary atherosclerosis seen on CT 02/15/2013     Current Outpatient Medications on File Prior to Visit  Medication Sig Dispense Refill   Accu-Chek Softclix Lancets lancets Use to check blood sugar 3 times daily. 100 each 3   acetaminophen  (TYLENOL ) 325 MG tablet Take 2 tablets (650 mg total) by mouth every 6 (six) hours as needed for mild pain (or Fever >/= 101).     apixaban  (ELIQUIS ) 5 MG TABS tablet Take 1 tablet (5 mg total) by mouth in the morning and at bedtime. 180 tablet 1   aspirin  EC 81 MG tablet Take 1 tablet (81 mg total) by mouth daily. Swallow whole.     atorvastatin  (LIPITOR) 40 MG tablet Take 1 tablet (40 mg total) by mouth daily. 90 tablet 3   Blood Glucose Monitoring Suppl (ACCU-CHEK GUIDE) w/Device KIT Use to check blood sugar 3 times daily. 1 kit 0   Blood Pressure Monitoring (BLOOD PRESSURE MONITOR/ARM) DEVI Check blood pressure once per day at least 2 hours after medications. 1 each 0   brimonidine  (ALPHAGAN ) 0.2 % ophthalmic solution Instill 1 drop into both eyes twice a day 180 mL 3   Continuous Glucose Sensor (DEXCOM G7 SENSOR) MISC Use as directed. Every 10 days 9 each 3   diclofenac  Sodium (VOLTAREN ) 1 % GEL Apply 2 g topically 4 (four) times daily. Apply across lower back for back pain. 100 g 0   empagliflozin  (JARDIANCE ) 25 MG TABS tablet Take 1 tablet (25 mg total) by mouth daily before breakfast. 90 tablet 3   fenofibrate  (TRICOR ) 48 MG tablet Take 1 tablet (48 mg total) by mouth daily. 90 tablet 1   ferrous sulfate  325 (65 FE) MG tablet Take 1 tablet (325 mg total) by mouth 2 (two) times a week. 30 tablet 4   fluticasone  (FLONASE ) 50 MCG/ACT nasal spray Place 2 sprays into both nostrils daily. 16 g 6   glucose blood (TRUE METRIX BLOOD GLUCOSE TEST) test strip Use as  instructed 100 each 12   Insulin  Disposable Pump (OMNIPOD 5 G7 PODS, GEN 5,) MISC Use every other day. 45 each 3   insulin  lispro (HUMALOG ) 100 UNIT/ML injection Inject 36 units per day via insulin  pump 30 mL 3   Multiple Vitamins-Minerals (CENTRUM SILVER  PO) Take 1 tablet by mouth daily.     Multiple Vitamins-Minerals (OCUVITE ADULT 50+) CAPS Take 1 capsule by mouth daily.     tirzepatide  (MOUNJARO ) 10 MG/0.5ML Pen Inject 10 mg into the skin once a week. 6 mL 3   torsemide  (DEMADEX ) 20 MG tablet Take 1 tablet (20 mg) once a day, Take extra dose of 20 mg as needed for sudden weight gain and leg swelling 90 tablet 3   No current facility-administered medications on file prior to visit.    Allergies  Allergen Reactions   Trulicity  [Dulaglutide ] Diarrhea    Social History   Socioeconomic History   Marital status: Single    Spouse name: Not on file   Number of children: Not on file   Years of education: Not on file  Highest education level: Not on file  Occupational History   Not on file  Tobacco Use   Smoking status: Never   Smokeless tobacco: Never  Vaping Use   Vaping status: Never Used  Substance and Sexual Activity   Alcohol  use: No   Drug use: No   Sexual activity: Not Currently  Other Topics Concern   Not on file  Social History Narrative   Lives with her sister and does not need any assistance with ADLs.     Social Drivers of Health   Financial Resource Strain: Medium Risk (12/08/2023)   Overall Financial Resource Strain (CARDIA)    Difficulty of Paying Living Expenses: Somewhat hard  Food Insecurity: Food Insecurity Present (12/08/2023)   Hunger Vital Sign    Worried About Running Out of Food in the Last Year: Sometimes true    Ran Out of Food in the Last Year: Often true  Transportation Needs: Unmet Transportation Needs (12/08/2023)   PRAPARE - Administrator, Civil Service (Medical): No    Lack of Transportation (Non-Medical): Yes  Physical  Activity: Sufficiently Active (12/08/2023)   Exercise Vital Sign    Days of Exercise per Week: 5 days    Minutes of Exercise per Session: 30 min  Stress: No Stress Concern Present (12/08/2023)   [employer] of Occupational Health - Occupational Stress Questionnaire    Feeling of Stress: Not at all  Social Connections: Socially Isolated (12/08/2023)   Social Connection and Isolation Panel    Frequency of Communication with Friends and Family: Once a week    Frequency of Social Gatherings with Friends and Family: Once a week    Attends Religious Services: Never    Database administrator or Organizations: No    Attends Banker Meetings: Never    Marital Status: Never married  Intimate Partner Violence: Not At Risk (12/08/2023)   Humiliation, Afraid, Rape, and Kick questionnaire    Fear of Current or Ex-Partner: No    Emotionally Abused: No    Physically Abused: No    Sexually Abused: No    Family History  Problem Relation Age of Onset   Heart attack Mother    Heart attack Father    Diabetes Sister    High blood pressure Neg Hx    High Cholesterol Neg Hx     Past Surgical History:  Procedure Laterality Date   AV NODE ABLATION N/A 01/02/2023   Procedure: AV NODE ABLATION;  Surgeon: Waddell Danelle ORN, MD;  Location: [facility] INVASIVE CV LAB;  Service: Cardiovascular;  Laterality: N/A;   BIOPSY  06/20/2018   Procedure: BIOPSY;  Surgeon: Rosalie Kitchens, MD;  Location: [facility] ENDOSCOPY;  Service: Endoscopy;;   CHOLECYSTECTOMY N/A 01/04/2014   Procedure: LAPAROSCOPIC CHOLECYSTECTOMY WITH INTRAOPERATIVE CHOLANGIOGRAM;  Surgeon: Lynda Leos, MD;  Location: [facility] OR;  Service: General;  Laterality: N/A;   COLONOSCOPY WITH PROPOFOL  N/A 06/21/2018   Procedure: COLONOSCOPY WITH PROPOFOL ;  Surgeon: Donnald Charleston, MD;  Location: [facility] ENDOSCOPY;  Service: Endoscopy;  Laterality: N/A;   CORONARY ARTERY BYPASS GRAFT N/A 03/09/2023   Procedure: CORONARY ARTERY BYPASS GRAFTING (CABG) TIMES THREE USING  THE LEFT INTERNAL MAMMARY ARTERY (LIMA) AND ENDOSCOPICALLY HARVESTED RIGHT GREATER SAPHENOUS VEIN;  Surgeon: Lucas Dorise POUR, MD;  Location: [facility] OR;  Service: Open Heart Surgery;  Laterality: N/A;   ESOPHAGOGASTRODUODENOSCOPY (EGD) WITH PROPOFOL  N/A 06/20/2018   Procedure: ESOPHAGOGASTRODUODENOSCOPY (EGD) WITH PROPOFOL ;  Surgeon: Rosalie Kitchens, MD;  Location: [facility] ENDOSCOPY;  Service: Endoscopy;  Laterality: N/A;   IR THORACENTESIS ASP PLEURAL SPACE W/IMG GUIDE  03/16/2023   LUNG SURGERY     PACEMAKER IMPLANT N/A 03/03/2022   Procedure: PACEMAKER IMPLANT;  Surgeon: Waddell Danelle ORN, MD;  Location: [facility] INVASIVE CV LAB;  Service: Cardiovascular;  Laterality: N/A;   RIGHT HEART CATH N/A 03/06/2023   Procedure: RIGHT HEART CATH;  Surgeon: Rolan Ezra RAMAN, MD;  Location: [facility] INVASIVE CV LAB;  Service: Cardiovascular;  Laterality: N/A;   RIGHT/LEFT HEART CATH AND CORONARY ANGIOGRAPHY N/A 03/03/2023   Procedure: RIGHT/LEFT HEART CATH AND CORONARY ANGIOGRAPHY;  Surgeon: Anner Alm ORN, MD;  Location: [facility] INVASIVE CV LAB;  Service: Cardiovascular;  Laterality: N/A;   TEE WITHOUT CARDIOVERSION N/A 03/09/2023   Procedure: TRANSESOPHAGEAL ECHOCARDIOGRAM;  Surgeon: Lucas Dorise POUR, MD;  Location: [facility] OR;  Service: Open Heart Surgery;  Laterality: N/A;    ROS: Review of Systems Negative except as stated above  PHYSICAL EXAM: BP 120/77   Pulse 88   Temp 97.8 F (36.6 C) (Oral)   Ht 5' 3 (1.6 m)   Wt 181 lb (82.1 kg)   SpO2 97%   BMI 32.06 kg/m   Wt Readings from Last 3 Encounters:  02/16/24 181 lb (82.1 kg)  12/25/23 187 lb (84.8 kg)  12/08/23 177 lb (80.3 kg)    Physical Exam  General appearance - alert, well appearing, older obese caucasian female and in no distress Mental status - normal mood, behavior, speech, dress, motor activity, and thought processes Neck - supple, no significant adenopathy Chest - clear to auscultation, no wheezes, rales or rhonchi, symmetric air entry Heart - normal rate,  regular rhythm, normal S1, S2, no murmurs, rubs, clicks or gallops Extremities - peripheral pulses normal, no pedal edema, no clubbing or cyanosis      Latest Ref Rng & Units 10/06/2023   11:28 AM 07/02/2023    9:40 AM 05/13/2023    9:10 PM  CMP  Glucose 70 - 99 mg/dL 869  838  893   BUN 8 - 27 mg/dL 59  63  57   Creatinine 0.57 - 1.00 mg/dL 8.30  8.70  8.51   Sodium 134 - 144 mmol/L 138  139  138   Potassium 3.5 - 5.2 mmol/L 3.7  3.7  3.9   Chloride 96 - 106 mmol/L 97  97  106   CO2 20 - 29 mmol/L 24  27  19    Calcium  8.7 - 10.3 mg/dL 9.8  9.7  9.2   Total Protein 6.5 - 8.1 g/dL   7.0   Total Bilirubin <1.2 mg/dL   0.4   Alkaline Phos 38 - 126 U/L   44   AST 15 - 41 U/L   23   ALT 0 - 44 U/L   14    Lipid Panel     Component Value Date/Time   CHOL 95 03/02/2023 0422   CHOL 351 (H) 11/18/2022 1216   TRIG 121 03/02/2023 0422   HDL 39 (L) 03/02/2023 0422   HDL 31 (L) 11/18/2022 1216   CHOLHDL 2.4 03/02/2023 0422   VLDL 24 03/02/2023 0422   LDLCALC 32 03/02/2023 0422   LDLCALC Comment (A) 11/18/2022 1216   LDLDIRECT 137 (H) 03/01/2020 1012   LDLDIRECT 96.0 09/23/2019 0828    CBC    Component Value Date/Time   WBC 9.4 10/06/2023 1128   WBC 9.4 05/13/2023 2110   RBC 4.85 10/06/2023 1128   RBC 4.40 05/13/2023 2110   HGB 14.0  10/06/2023 1128   HCT 42.4 10/06/2023 1128   PLT 245 10/06/2023 1128   MCV 87 10/06/2023 1128   MCH 28.9 10/06/2023 1128   MCH 26.4 05/13/2023 2110   MCHC 33.0 10/06/2023 1128   MCHC 31.1 05/13/2023 2110   RDW 14.2 10/06/2023 1128   LYMPHSABS 1.6 05/13/2023 2110   LYMPHSABS 1.9 04/11/2021 1447   MONOABS 0.8 05/13/2023 2110   EOSABS 0.6 (H) 05/13/2023 2110   EOSABS 0.4 04/11/2021 1447   BASOSABS 0.0 05/13/2023 2110   BASOSABS 0.0 04/11/2021 1447    ASSESSMENT AND PLAN: 1. Type 2 diabetes mellitus with other circulatory complication, with long-term current use of insulin  (HCC) (Primary) Patient is plugged in with endocrinologist.   Continuous glucose monitor currently showing that time in range has been 81% low blood sugar episodes.  She will continue her current medications including the insulin  pump, Jardiance  25 mg daily 10 mg once a week. - Comprehensive metabolic panel with GFR - CBC - Microalbumin / creatinine urine ratio  2. Diabetes mellitus treated with oral medication (HCC) 3. Long-term (current) use of injectable non-insulin  antidiabetic drugs 4. Obesity (BMI 30.0-34.9) Encouraged her to continue working on eating smaller portions and getting enough protein with each meal.  5. Coronary artery disease involving native coronary artery of native heart without angina pectoris Stable.  Continue baby aspirin , atorvastatin  40 mg daily fenofibrate  40 mg daily.  Not on beta-blocker due to history of symptomatic bradycardia.  6. Chronic congestive heart failure, unspecified heart failure type (HCC) Stable and compensated.  Continue torsemide .  7. PAF (paroxysmal atrial fibrillation) (HCC) Sounds to be in sinus rhythm at this time. Continue Eliquis .  8. Stage 3b chronic kidney disease (HCC) GFR has remained in the 30s to 40s.  Continue to avoid NSAIDs.  9. Iron deficiency anemia, unspecified iron deficiency anemia type Taking iron supplement twice a week.  We will recheck CBC today to make sure that her hemoglobin has remained stable.  10. Need for influenza vaccination given   Patient was given the opportunity to ask questions.  Patient verbalized understanding of the plan and was able to repeat key elements of the plan.   This documentation was completed using Paediatric nurse.  Any transcriptional errors are unintentional.  Orders Placed This Encounter  Procedures   Comprehensive metabolic panel with GFR   CBC   Microalbumin / creatinine urine ratio     Requested Prescriptions    No prescriptions requested or ordered in this encounter    Return in about 4 months (around  06/17/2024) for chronic ds management.  Barnie Louder, MD, FACP

## 2024-02-17 ENCOUNTER — Ambulatory Visit: Payer: Self-pay | Admitting: Internal Medicine

## 2024-02-17 DIAGNOSIS — N1832 Chronic kidney disease, stage 3b: Secondary | ICD-10-CM

## 2024-02-17 NOTE — Progress Notes (Signed)
 Kidney function is not at 100% but stable. Liver function test normal. Blood cell counts are normal. She can stop the iron supplement.

## 2024-02-18 LAB — COMPREHENSIVE METABOLIC PANEL WITH GFR
ALT: 17 IU/L (ref 0–32)
AST: 21 IU/L (ref 0–40)
Albumin: 4.6 g/dL (ref 3.9–4.9)
Alkaline Phosphatase: 58 IU/L (ref 44–121)
BUN/Creatinine Ratio: 28 (ref 12–28)
BUN: 44 mg/dL — ABNORMAL HIGH (ref 8–27)
Bilirubin Total: 0.3 mg/dL (ref 0.0–1.2)
CO2: 21 mmol/L (ref 20–29)
Calcium: 10.1 mg/dL (ref 8.7–10.3)
Chloride: 98 mmol/L (ref 96–106)
Creatinine, Ser: 1.57 mg/dL — ABNORMAL HIGH (ref 0.57–1.00)
Globulin, Total: 2.5 g/dL (ref 1.5–4.5)
Glucose: 124 mg/dL — ABNORMAL HIGH (ref 70–99)
Potassium: 4.1 mmol/L (ref 3.5–5.2)
Sodium: 138 mmol/L (ref 134–144)
Total Protein: 7.1 g/dL (ref 6.0–8.5)
eGFR: 36 mL/min/1.73 — ABNORMAL LOW (ref >59)

## 2024-02-18 LAB — CBC
Hematocrit: 45 % (ref 34.0–46.6)
Hemoglobin: 14.6 g/dL (ref 11.1–15.9)
MCH: 29 pg (ref 26.6–33.0)
MCHC: 32.4 g/dL (ref 31.5–35.7)
MCV: 89 fL (ref 79–97)
Platelets: 282 x10E3/uL (ref 150–450)
RBC: 5.04 x10E6/uL (ref 3.77–5.28)
RDW: 14.4 % (ref 11.7–15.4)
WBC: 9.6 x10E3/uL (ref 3.4–10.8)

## 2024-02-18 LAB — MICROALBUMIN / CREATININE URINE RATIO
Creatinine, Urine: 80.8 mg/dL
Microalb/Creat Ratio: 97 mg/g{creat} — AB (ref 0–29)
Microalbumin, Urine: 78.3 ug/mL

## 2024-02-19 ENCOUNTER — Other Ambulatory Visit (HOSPITAL_COMMUNITY): Payer: Self-pay

## 2024-02-19 ENCOUNTER — Other Ambulatory Visit: Payer: Self-pay

## 2024-02-22 DIAGNOSIS — H43813 Vitreous degeneration, bilateral: Secondary | ICD-10-CM | POA: Diagnosis not present

## 2024-02-22 DIAGNOSIS — H35033 Hypertensive retinopathy, bilateral: Secondary | ICD-10-CM | POA: Diagnosis not present

## 2024-02-22 DIAGNOSIS — E113512 Type 2 diabetes mellitus with proliferative diabetic retinopathy with macular edema, left eye: Secondary | ICD-10-CM | POA: Diagnosis not present

## 2024-02-22 DIAGNOSIS — H353112 Nonexudative age-related macular degeneration, right eye, intermediate dry stage: Secondary | ICD-10-CM | POA: Diagnosis not present

## 2024-02-22 DIAGNOSIS — H353124 Nonexudative age-related macular degeneration, left eye, advanced atrophic with subfoveal involvement: Secondary | ICD-10-CM | POA: Diagnosis not present

## 2024-02-23 ENCOUNTER — Other Ambulatory Visit (HOSPITAL_COMMUNITY): Payer: Self-pay

## 2024-02-23 ENCOUNTER — Other Ambulatory Visit: Payer: Self-pay

## 2024-02-24 DIAGNOSIS — Z961 Presence of intraocular lens: Secondary | ICD-10-CM | POA: Diagnosis not present

## 2024-02-24 DIAGNOSIS — H401134 Primary open-angle glaucoma, bilateral, indeterminate stage: Secondary | ICD-10-CM | POA: Diagnosis not present

## 2024-03-21 DIAGNOSIS — I129 Hypertensive chronic kidney disease with stage 1 through stage 4 chronic kidney disease, or unspecified chronic kidney disease: Secondary | ICD-10-CM | POA: Diagnosis not present

## 2024-03-21 DIAGNOSIS — R809 Proteinuria, unspecified: Secondary | ICD-10-CM | POA: Diagnosis not present

## 2024-03-21 DIAGNOSIS — E1122 Type 2 diabetes mellitus with diabetic chronic kidney disease: Secondary | ICD-10-CM | POA: Diagnosis not present

## 2024-03-21 DIAGNOSIS — N1832 Chronic kidney disease, stage 3b: Secondary | ICD-10-CM | POA: Diagnosis not present

## 2024-03-28 DIAGNOSIS — H02833 Dermatochalasis of right eye, unspecified eyelid: Secondary | ICD-10-CM | POA: Diagnosis not present

## 2024-03-28 DIAGNOSIS — H353112 Nonexudative age-related macular degeneration, right eye, intermediate dry stage: Secondary | ICD-10-CM | POA: Diagnosis not present

## 2024-03-28 DIAGNOSIS — H35033 Hypertensive retinopathy, bilateral: Secondary | ICD-10-CM | POA: Diagnosis not present

## 2024-03-28 DIAGNOSIS — H43813 Vitreous degeneration, bilateral: Secondary | ICD-10-CM | POA: Diagnosis not present

## 2024-03-28 DIAGNOSIS — H353124 Nonexudative age-related macular degeneration, left eye, advanced atrophic with subfoveal involvement: Secondary | ICD-10-CM | POA: Diagnosis not present

## 2024-03-28 DIAGNOSIS — E113512 Type 2 diabetes mellitus with proliferative diabetic retinopathy with macular edema, left eye: Secondary | ICD-10-CM | POA: Diagnosis not present

## 2024-03-28 DIAGNOSIS — H02836 Dermatochalasis of left eye, unspecified eyelid: Secondary | ICD-10-CM | POA: Diagnosis not present

## 2024-04-03 DIAGNOSIS — E1165 Type 2 diabetes mellitus with hyperglycemia: Secondary | ICD-10-CM | POA: Diagnosis not present

## 2024-04-04 ENCOUNTER — Other Ambulatory Visit (HOSPITAL_COMMUNITY): Payer: Self-pay

## 2024-04-05 DIAGNOSIS — H353112 Nonexudative age-related macular degeneration, right eye, intermediate dry stage: Secondary | ICD-10-CM | POA: Diagnosis not present

## 2024-04-05 DIAGNOSIS — E113511 Type 2 diabetes mellitus with proliferative diabetic retinopathy with macular edema, right eye: Secondary | ICD-10-CM | POA: Diagnosis not present

## 2024-04-05 DIAGNOSIS — H353124 Nonexudative age-related macular degeneration, left eye, advanced atrophic with subfoveal involvement: Secondary | ICD-10-CM | POA: Diagnosis not present

## 2024-04-05 DIAGNOSIS — H35033 Hypertensive retinopathy, bilateral: Secondary | ICD-10-CM | POA: Diagnosis not present

## 2024-04-05 DIAGNOSIS — H43813 Vitreous degeneration, bilateral: Secondary | ICD-10-CM | POA: Diagnosis not present

## 2024-04-06 ENCOUNTER — Other Ambulatory Visit: Payer: Self-pay

## 2024-04-06 ENCOUNTER — Other Ambulatory Visit (HOSPITAL_COMMUNITY): Payer: Self-pay

## 2024-04-08 ENCOUNTER — Telehealth: Payer: Self-pay | Admitting: Internal Medicine

## 2024-04-08 NOTE — Telephone Encounter (Signed)
 Copied from CRM [id number]. Topic: General - Other >> Apr 08, 2024 10:08 AM Zebedee SAUNDERS wrote:  Reason for CRM: Pt wants to know if she may take calcium  caetate 687 mg tablet 3 times a day. Please call pt [phone].

## 2024-04-09 ENCOUNTER — Other Ambulatory Visit (HOSPITAL_COMMUNITY): Payer: Self-pay

## 2024-04-09 NOTE — Telephone Encounter (Signed)
 Who recommended this? Was it prescribed by her kidney specialist? If so, should take as specialist prescribed.

## 2024-04-12 NOTE — Telephone Encounter (Signed)
 Called but no answer. LVM to call back.

## 2024-04-12 NOTE — Telephone Encounter (Signed)
 Pt called to report that her sister's doctor told her (sister) not to take this anymore so the pt just wants to know if this is safe for her to take, please advise.   Best contact: [phone]

## 2024-04-13 NOTE — Telephone Encounter (Signed)
 This is a medication that is usually prescribed by kidney specialist when blood phosphate level becomes too elevated.  If her kidney specialist did not put her on this medication then she should not take it.

## 2024-04-13 NOTE — Telephone Encounter (Signed)
 Called but no answer. LVM to call back.

## 2024-04-18 ENCOUNTER — Other Ambulatory Visit (HOSPITAL_COMMUNITY): Payer: Self-pay

## 2024-04-20 ENCOUNTER — Other Ambulatory Visit (HOSPITAL_COMMUNITY): Payer: Self-pay

## 2024-04-20 NOTE — Telephone Encounter (Signed)
 Called but no answer. LVM to call back.

## 2024-04-22 ENCOUNTER — Other Ambulatory Visit: Payer: Self-pay

## 2024-04-22 ENCOUNTER — Other Ambulatory Visit (HOSPITAL_COMMUNITY): Payer: Self-pay

## 2024-04-24 DIAGNOSIS — Z833 Family history of diabetes mellitus: Secondary | ICD-10-CM | POA: Diagnosis not present

## 2024-04-24 DIAGNOSIS — Z8249 Family history of ischemic heart disease and other diseases of the circulatory system: Secondary | ICD-10-CM | POA: Diagnosis not present

## 2024-04-24 DIAGNOSIS — D509 Iron deficiency anemia, unspecified: Secondary | ICD-10-CM | POA: Diagnosis not present

## 2024-04-24 DIAGNOSIS — E113599 Type 2 diabetes mellitus with proliferative diabetic retinopathy without macular edema, unspecified eye: Secondary | ICD-10-CM | POA: Diagnosis not present

## 2024-04-24 DIAGNOSIS — E785 Hyperlipidemia, unspecified: Secondary | ICD-10-CM | POA: Diagnosis not present

## 2024-04-24 DIAGNOSIS — E669 Obesity, unspecified: Secondary | ICD-10-CM | POA: Diagnosis not present

## 2024-04-24 DIAGNOSIS — I25119 Atherosclerotic heart disease of native coronary artery with unspecified angina pectoris: Secondary | ICD-10-CM | POA: Diagnosis not present

## 2024-04-24 DIAGNOSIS — I495 Sick sinus syndrome: Secondary | ICD-10-CM | POA: Diagnosis not present

## 2024-04-24 DIAGNOSIS — I509 Heart failure, unspecified: Secondary | ICD-10-CM | POA: Diagnosis not present

## 2024-04-24 DIAGNOSIS — Z7982 Long term (current) use of aspirin: Secondary | ICD-10-CM | POA: Diagnosis not present

## 2024-04-24 DIAGNOSIS — M1990 Unspecified osteoarthritis, unspecified site: Secondary | ICD-10-CM | POA: Diagnosis not present

## 2024-04-24 DIAGNOSIS — I429 Cardiomyopathy, unspecified: Secondary | ICD-10-CM | POA: Diagnosis not present

## 2024-04-24 DIAGNOSIS — H409 Unspecified glaucoma: Secondary | ICD-10-CM | POA: Diagnosis not present

## 2024-04-24 DIAGNOSIS — Z95810 Presence of automatic (implantable) cardiac defibrillator: Secondary | ICD-10-CM | POA: Diagnosis not present

## 2024-04-24 DIAGNOSIS — I442 Atrioventricular block, complete: Secondary | ICD-10-CM | POA: Diagnosis not present

## 2024-04-24 DIAGNOSIS — N1832 Chronic kidney disease, stage 3b: Secondary | ICD-10-CM | POA: Diagnosis not present

## 2024-04-24 DIAGNOSIS — I4891 Unspecified atrial fibrillation: Secondary | ICD-10-CM | POA: Diagnosis not present

## 2024-04-24 DIAGNOSIS — Z7901 Long term (current) use of anticoagulants: Secondary | ICD-10-CM | POA: Diagnosis not present

## 2024-04-24 DIAGNOSIS — I700 Atherosclerosis of aorta: Secondary | ICD-10-CM | POA: Diagnosis not present

## 2024-04-24 DIAGNOSIS — D6869 Other thrombophilia: Secondary | ICD-10-CM | POA: Diagnosis not present

## 2024-04-25 ENCOUNTER — Ambulatory Visit: Attending: Internal Medicine | Admitting: Internal Medicine

## 2024-04-25 ENCOUNTER — Other Ambulatory Visit (HOSPITAL_COMMUNITY): Payer: Self-pay

## 2024-04-25 ENCOUNTER — Encounter: Payer: Self-pay | Admitting: Internal Medicine

## 2024-04-25 VITALS — BP 118/72 | HR 74 | Ht 63.0 in | Wt 177.3 lb

## 2024-04-25 DIAGNOSIS — I480 Paroxysmal atrial fibrillation: Secondary | ICD-10-CM

## 2024-04-25 LAB — CUP PACEART INCLINIC DEVICE CHECK
Date Time Interrogation Session: 20251110171022
Implantable Lead Connection Status: 753985
Implantable Lead Implant Date: 20230918
Implantable Lead Location: 753860
Implantable Lead Model: 3830
Implantable Pulse Generator Implant Date: 20230918

## 2024-04-25 NOTE — Progress Notes (Signed)
 HPI Emily Farmer returns today for ongoing evaluation of atrial fib with tachy-brady syndrome. She is a pleasant 68 yo woman with uncontrolled atrial fib and is s/p VVI PM insertion over a year ago with AV node ablation.  In the interim she notes she feels well with no chest pain or sob.  Allergies  Allergen Reactions   Trulicity  [Dulaglutide ] Diarrhea     Current Outpatient Medications  Medication Sig Dispense Refill   Accu-Chek Softclix Lancets lancets Use to check blood sugar 3 times daily. 100 each 3   acetaminophen  (TYLENOL ) 325 MG tablet Take 2 tablets (650 mg total) by mouth every 6 (six) hours as needed for mild pain (or Fever >/= 101).     apixaban  (ELIQUIS ) 5 MG TABS tablet Take 1 tablet (5 mg total) by mouth in the morning and at bedtime. 180 tablet 1   aspirin  EC 81 MG tablet Take 1 tablet (81 mg total) by mouth daily. Swallow whole.     atorvastatin  (LIPITOR) 40 MG tablet Take 1 tablet (40 mg total) by mouth daily. 90 tablet 3   Blood Glucose Monitoring Suppl (ACCU-CHEK GUIDE) w/Device KIT Use to check blood sugar 3 times daily. 1 kit 0   Blood Pressure Monitoring (BLOOD PRESSURE MONITOR/ARM) DEVI Check blood pressure once per day at least 2 hours after medications. 1 each 0   brimonidine  (ALPHAGAN ) 0.2 % ophthalmic solution Instill 1 drop into both eyes twice a day 180 mL 3   Continuous Glucose Sensor (DEXCOM G7 SENSOR) MISC Use as directed. Every 10 days 9 each 3   diclofenac  Sodium (VOLTAREN ) 1 % GEL Apply 2 g topically 4 (four) times daily. Apply across lower back for back pain. 100 g 0   empagliflozin  (JARDIANCE ) 25 MG TABS tablet Take 1 tablet (25 mg total) by mouth daily before breakfast. 90 tablet 3   fenofibrate  (TRICOR ) 48 MG tablet Take 1 tablet (48 mg total) by mouth daily. 90 tablet 1   ferrous sulfate  325 (65 FE) MG tablet Take 1 tablet (325 mg total) by mouth 2 (two) times a week. 30 tablet 4   fluticasone  (FLONASE ) 50 MCG/ACT nasal spray Place 2 sprays into  both nostrils daily. 16 g 6   glucose blood (TRUE METRIX BLOOD GLUCOSE TEST) test strip Use as instructed 100 each 12   Insulin  Disposable Pump (OMNIPOD 5 G7 PODS, GEN 5,) MISC Use every other day. 45 each 3   insulin  lispro (HUMALOG ) 100 UNIT/ML injection Inject 36 units per day via insulin  pump 30 mL 3   Multiple Vitamins-Minerals (CENTRUM SILVER  PO) Take 1 tablet by mouth daily.     Multiple Vitamins-Minerals (OCUVITE ADULT 50+) CAPS Take 1 capsule by mouth daily.     tirzepatide  (MOUNJARO ) 10 MG/0.5ML Pen Inject 10 mg into the skin once a week. 6 mL 3   torsemide  (DEMADEX ) 20 MG tablet Take 1 tablet (20 mg) once a day, Take extra dose of 20 mg as needed for sudden weight gain and leg swelling 90 tablet 3   No current facility-administered medications for this visit.     Past Medical History:  Diagnosis Date   Arthritis    Atrial fibrillation (HCC)    a. 09/2016 in setting of pancreatitis;  b. 09/2016 Echo: EF 65-60%, no rwma, Gr2 DD, mild MR, triv TR, PASP ;  CHA2DS2VASc = 4-->Eliquis  5mg  BID. c. Recurred 06/2018 in setting of GIB.   Chronic diastolic CHF (congestive heart failure) (HCC) 01/28/2018  Diabetes mellitus    GI bleeding 06/2018   Gout    Hyperlipidemia    Hypertension    Hypertriglyceridemia    Pancreatitis    a. 09/2016 - Triglycerides 1,392 on admission.    ROS:   All systems reviewed and negative except as noted in the HPI.   Past Surgical History:  Procedure Laterality Date   AV NODE ABLATION N/A 01/02/2023   Procedure: AV NODE ABLATION;  Surgeon: Waddell Danelle ORN, MD;  [facility];  Service: Cardiovascular;  Laterality: N/A;   BIOPSY  06/20/2018   Procedure: BIOPSY;  Surgeon: Rosalie Kitchens, MD;  Location: [facility] ENDOSCOPY;  Service: Endoscopy;;   CHOLECYSTECTOMY N/A 01/04/2014   Procedure: LAPAROSCOPIC CHOLECYSTECTOMY WITH INTRAOPERATIVE CHOLANGIOGRAM;  Surgeon: Lynda Leos, MD;  Location[facility] OR;  Service: General;  Laterality: N/A;    COLONOSCOPY WITH PROPOFOL  N/A 06/21/2018   Procedure: COLONOSCOPY WITH PROPOFOL ;  Surgeon: Donnald Charleston, MD;  Location: [facility] ENDOSCOPY;  Service: Endoscopy;  Laterality: N/A;   CORONARY ARTERY BYPASS GRAFT N/A 03/09/2023   Procedure: CORONARY ARTERY BYPASS GRAFTING (CABG) TIMES THREE USING THE LEFT INTERNAL MAMMARY ARTERY (LIMA) AND ENDOSCOPICALLY HARVESTED RIGHT GREATER SAPHENOUS VEIN;  Surgeon: Lucas Dorise POUR, MD;  [facility] OR;  Service: Open Heart Surgery;  Laterality: N/A;   ESOPHAGOGASTRODUODENOSCOPY (EGD) WITH PROPOFOL  N/A 06/20/2018   Procedure: ESOPHAGOGASTRODUODENOSCOPY (EGD) WITH PROPOFOL ;  Surgeon: Rosalie Kitchens, MD;  Location: [facility] ENDOSCOPY;  Service: Endoscopy;  Laterality: N/A;   IR THORACENTESIS ASP PLEURAL SPACE W/IMG GUIDE  03/16/2023   LUNG SURGERY     PACEMAKER IMPLANT N/A 03/03/2022   Procedure: PACEMAKER IMPLANT;  Surgeon: Waddell Danelle ORN, MD;  [facility];  Service: Cardiovascular;  Laterality: N/A;   RIGHT HEART CATH N/A 03/06/2023   Procedure: RIGHT HEART CATH;  Surgeon: Rolan Ezra RAMAN, MD;  Location: [facility] INVASIVE CV LAB;  Service: Cardiovascular;  Laterality: N/A;   RIGHT/LEFT HEART CATH AND CORONARY ANGIOGRAPHY N/A 03/03/2023   Procedure: RIGHT/LEFT HEART CATH AND CORONARY ANGIOGRAPHY;  Surgeon: Anner Alm ORN, MD;  Location[facility] INVASIVE CV LAB;  Service: Cardiovascular;  Laterality: N/A;   TEE WITHOUT CARDIOVERSION N/A 03/09/2023   Procedure: TRANSESOPHAGEAL ECHOCARDIOGRAM;  Surgeon: Lucas Dorise POUR, MD;  Location: [facility] OR;  Service: Open Heart Surgery;  Laterality: N/A;     Family History  Problem Relation Age of Onset   Heart attack Mother    Heart attack Father    Diabetes Sister    High blood pressure Neg Hx    High Cholesterol Neg Hx      Social History   Socioeconomic History   Marital status: Single    Spouse name: Not on file   Number of children: Not on file   Years of education: Not on file   Highest education level: Not on file   Occupational History   Not on file  Tobacco Use   Smoking status: Never   Smokeless tobacco: Never  Vaping Use   Vaping status: Never Used  Substance and Sexual Activity   Alcohol  use: No   Drug use: No   Sexual activity: Not Currently  Other Topics Concern   Not on file  Social History Narrative   Lives with her sister and does not need any assistance with ADLs.     Social Drivers of Health   Financial Resource Strain: Medium Risk (12/08/2023)   Overall Financial Resource Strain (CARDIA)    Difficulty of Paying Living Expenses: Somewhat hard  Food Insecurity: Food Insecurity Present (12/08/2023)   Hunger Vital Sign    Worried About Running Out of Food in the Last Year: Sometimes true    Ran Out of Food in the Last Year: Often true  Transportation Needs: Unmet Transportation Needs (12/08/2023)   PRAPARE - Transportation    Lack of Transportation (Medical): No    Lack of Transportation (Non-Medical): Yes  Physical Activity: Sufficiently Active (12/08/2023)   Exercise Vital Sign    Days of Exercise per Week: 5 days    Minutes of Exercise per Session: 30 min  Stress: No Stress Concern Present (12/08/2023)   [employer] of Occupational Health - Occupational Stress Questionnaire    Feeling of Stress: Not at all  Social Connections: Socially Isolated (12/08/2023)   Social Connection and Isolation Panel    Frequency of Communication with Friends and Family: Once a week    Frequency of Social Gatherings with Friends and Family: Once a week    Attends Religious Services: Never    Database Administrator or Organizations: No    Attends Banker Meetings: Never    Marital Status: Never married  Intimate Partner Violence: Not At Risk (12/08/2023)   Humiliation, Afraid, Rape, and Kick questionnaire    Fear of Current or Ex-Partner: No    Emotionally Abused: No    Physically Abused: No    Sexually Abused: No     BP 118/72   Pulse 74   Ht 5' 3 (1.6 m)   Wt 177 lb  4.8 oz (80.4 kg)   SpO2 93%   BMI 31.41 kg/m   Physical Exam:  Well appearing NAD HEENT: Unremarkable Neck:  No JVD, no thyromegally Lymphatics:  No adenopathy Back:  No CVA tenderness Lungs:  Clear with no wheezes HEART:  Regular rate rhythm, no murmurs, no rubs, no clicks Abd:  soft, positive bowel sounds, no organomegally, no rebound, no guarding Ext:  2 plus pulses, no edema, no cyanosis, no clubbing Skin:  No rashes no nodules Neuro:  CN II through XII intact, motor grossly intact  EKG - atrial fib with ventricular pacing  DEVICE  Normal device function.  See PaceArt for details.   Assess/Plan:  Uncontrolled atrial fib - she is s/p AV node ablation. Her symptoms are improved.  Chronic diastolic heart failure - her symptoms are improved since AV node ablation. She will continue her current meds.  3.  Coags - she has not had any bleeding. We will follow.   4. 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    Danelle Kellyanne Ellwanger,MD

## 2024-04-25 NOTE — Patient Instructions (Signed)

## 2024-04-27 ENCOUNTER — Encounter: Payer: Self-pay | Admitting: Internal Medicine

## 2024-04-27 ENCOUNTER — Ambulatory Visit (INDEPENDENT_AMBULATORY_CARE_PROVIDER_SITE_OTHER): Admitting: Internal Medicine

## 2024-04-27 VITALS — BP 106/70 | Ht 63.0 in | Wt 179.0 lb

## 2024-04-27 DIAGNOSIS — Z794 Long term (current) use of insulin: Secondary | ICD-10-CM

## 2024-04-27 DIAGNOSIS — E1159 Type 2 diabetes mellitus with other circulatory complications: Secondary | ICD-10-CM

## 2024-04-27 DIAGNOSIS — E1142 Type 2 diabetes mellitus with diabetic polyneuropathy: Secondary | ICD-10-CM

## 2024-04-27 DIAGNOSIS — E1165 Type 2 diabetes mellitus with hyperglycemia: Secondary | ICD-10-CM

## 2024-04-27 LAB — POCT GLYCOSYLATED HEMOGLOBIN (HGB A1C): Hemoglobin A1C: 7.7 % — AB (ref 4.0–5.6)

## 2024-04-27 NOTE — Patient Instructions (Signed)
-   Continue Mounjaro  10 mg weekly - Continue Jardiance  25 mg daily   Enter 3 g of carbohydrates with each meal    HOW TO TREAT LOW BLOOD SUGARS (Blood sugar LESS THAN 70 MG/DL) Please follow the RULE OF 15 for the treatment of hypoglycemia treatment (when your (blood sugars are less than 70 mg/dL)   STEP 1: Take 15 grams of carbohydrates when your blood sugar is low, which includes:  3-4 GLUCOSE TABS  OR 3-4 OZ OF JUICE OR REGULAR SODA OR ONE TUBE OF GLUCOSE GEL    STEP 2: RECHECK blood sugar in 15 MINUTES STEP 3: If your blood sugar is still low at the 15 minute recheck --> then, go back to STEP 1 and treat AGAIN with another 15 grams of carbohydrates.

## 2024-04-27 NOTE — Progress Notes (Signed)
 Name: Emily Farmer  Age/ Sex: 68 y.o., female   MRN/ DOB: [id number], 26-Mar-1956     PCP: Vicci Barnie NOVAK, MD   Reason for Endocrinology Evaluation: Type 2 Diabetes Mellitus  Initial Endocrine Consultative Visit: 09/26/2019    PATIENT IDENTIFIER: [name] is a 68 y.o. female with a past medical history of DM, HTN, A.Fib , S/P pacemaker, CAD(S/P CABG 2024)   and dyslipidemia. The patient has followed with Endocrinology clinic since 09/26/2019 for consultative assistance with management of her diabetes.  DIABETIC HISTORY:  [name] was diagnosed with DM in 2013, has been on insulin  and metformin  since diagnosis. Her hemoglobin A1c has ranged from 9.5% in 2020, peaking at 12.2% in 2019    On initial visit to our clinic, her A1c was 12.4% . She was on Lantus  and metformin  and we added Novolog     Stopped metformin  05/2021 and started Jardiance   Trulicity  caused diarrhea   Lives with sister    She was trained on Dexcom 09/2022  Discontinue pioglitazone  06/2023 due to lower extremity edema   She was trained on the Omnipod/16/2025  SUBJECTIVE:   During the last visit (09/22/2023): A1c 7.5%   Today (04/27/2024): [name] is here for a follow up on diabetes management.  She checks her blood sugars multiple time a day , through the dexcom . The patient has not   had hypoglycemic episodes since the last clinic visit.    She continues to follow-up with cardiology for CAD, s/p CABG x 3 02/2023.  As well as s/p ablation in 2024 Has been noted with weight loss  No nausea or vomiting  No constipation or diarrhea  No genital infections   This patient with type 2 diabetes is treated with OmniPod (insulin  pump). During the visit the pump basal and bolus doses were reviewed including carb/insulin  rations and supplemental doses. The clinical list was updated. The glucose meter download was reviewed in detail to determine if the current pump settings are providing the best glycemic  control without excessive hypoglycemia.  Pump and meter download:    Pump   OmniPod Settings   Insulin  type   Humalog    Basal rate       0000 0.55 u/h               I:C ratio       0000 1:1    #2 g with each meal              Sensitivity       0000  40      Goal       0000  120            Type & Model of Pump: OmniPod Insulin  Type: Currently using Humalog .  Body mass index is 31.71 kg/m.  PUMP STATISTICS: Average BG: 182 Average Daily Carbs (g): 4.2 Average Total Daily Insulin :17.6 Average Daily Basal: 11.4 (65 %) Average Daily Bolus: 6.2 (35 %)      HOME DIABETES REGIMEN:  Jardiance  25 mg daily Mounjaro  7.5 mg weekly Humalog      Statin: Yes ACE-I/ARB: no   CONTINUOUS GLUCOSE MONITORING RECORD INTERPRETATION    Dates of Recording: 10/30-11/05/2024  Sensor description: Dexcom  Results statistics:   CGM use % of time 70.2  Average and SD 182/52  Time in range 53 %  % Time Above 180 37  % Time above 250 10  % Time Below target 0   Glycemic  patterns summary: BGs are optimal overnight and fluctuate during the day Hyperglycemic episodes postprandial  Hypoglycemic episodes occurred N/A  Overnight periods: Optimal    DIABETIC COMPLICATIONS: Microvascular complications:  Fluctuating GFR, cataract, neuropathy  Last eye exam: Completed 09/28/2023 Macrovascular complications:    Denies: CAD, PVD, CVA      HISTORY:  Past Medical History:  Past Medical History:  Diagnosis Date   Arthritis    Atrial fibrillation (HCC)    a. 09/2016 in setting of pancreatitis;  b. 09/2016 Echo: EF 65-60%, no rwma, Gr2 DD, mild MR, triv TR, PASP ;  CHA2DS2VASc = 4-->Eliquis  5mg  BID. c. Recurred 06/2018 in setting of GIB.   Chronic diastolic CHF (congestive heart failure) (HCC) 01/28/2018   Diabetes mellitus    GI bleeding 06/2018   Gout    Hyperlipidemia    Hypertension    Hypertriglyceridemia    Pancreatitis    a. 09/2016 - Triglycerides  1,392 on admission.   Past Surgical History:  Past Surgical History:  Procedure Laterality Date   AV NODE ABLATION N/A 01/02/2023   Procedure: AV NODE ABLATION;  Surgeon: Waddell Danelle ORN, MD;  [facility]: [facility] INVAS[facility] CV LAB;  Service: Cardiovascular;  Laterality: N/A;   BIOPSY  06/20/2018   Procedure: BIOPSY;  Surgeon: Rosalie Kitchens, MD;  Location: [facility] ENDOSCOPY;  Service: Endoscopy;;   CHOLECYSTECTOMY N/A 01/04/2014   Procedure: LAPAROSCOPIC CHOLECYSTECTOMY WITH INTRAOPERATIVE CHOLANGIOGRAM;  Surgeon: Lynda Leos, MD;  Location: [facility] OR;  Service: General;  Laterality: N/A;   COLONOSCOPY WITH PROPOFOL  N/A 06/21/2018   Procedure: COLONOSCOPY WITH PROPOFOL ;  Surgeon: Donnald Charleston, MD;  Location: [facility] ENDOSCOPY;  Service: Endoscopy;  Laterality: N/A;   CORONARY ARTERY BYPASS GRAFT N/A 03/09/2023   Procedure: CORONARY ARTERY BYPASS GRAFTING (CABG) TIMES THREE USING THE LEFT INTERNAL MAMMARY ARTERY (LIMA) AND ENDOSCOPICALLY HARVESTED RIGHT GREATER SAPHENOUS VEIN;  Surgeon: Lucas Dorise POUR, MD;  Location: [facility] OR;  Service: Open Heart Surgery;  Laterality: N/A;   ESOPHAGOGASTRODUODENOSCOPY (EGD) WITH PROPOFOL  N/A 06/20/2018   Procedure: ESOPHAGOGASTRODUODENOSCOPY (EGD) WITH PROPOFOL ;  Surgeon: Rosalie Kitchens, MD;  Location: [facility] ENDOSCOPY;  Service: Endoscopy;  Laterality: N/A;   IR THORACENTESIS ASP PLEURAL SPACE W/IMG GUIDE  03/16/2023   LUNG SURGERY     PACEMAKER IMPLANT N/A 03/03/2022   Procedure: PACEMAKER IMPLANT;  Surgeon: Waddell Danelle ORN, MD;  [facility] LAB;  Service: Cardiovascular;  Laterality: N/A;   RIGHT HEART CATH N/A 03/06/2023   Procedure: RIGHT HEART CATH;  Surgeon: Rolan Ezra RAMAN, MD;  Location: [facility] INVASIVE CV LAB;  Service: Cardiovascular;  Laterality: N/A;   RIGHT/LEFT HEART CATH AND CORONARY ANGIOGRAPHY N/A 03/03/2023   Procedure: RIGHT/LEFT HEART CATH AND CORONARY ANGIOGRAPHY;  Surgeon: Anner Alm ORN, MD;  Location: [facility] INVASIVE CV LAB;  Service: Cardiovascular;  Laterality:  N/A;   TEE WITHOUT CARDIOVERSION N/A 03/09/2023   Procedure: TRANSESOPHAGEAL ECHOCARDIOGRAM;  Surgeon: Lucas Dorise POUR, MD;  Location: [facility] OR;  Service: Open Heart Surgery;  Laterality: N/A;   Social History:  reports that she has never smoked. She has never used smokeless tobacco. She reports that she does not drink alcohol  and does not use drugs. Family History:  Family History  Problem Relation Age of Onset   Heart attack Mother    Heart attack Father    Diabetes Sister    High blood pressure Neg Hx    High Cholesterol Neg Hx      HOME MEDICATIONS: Allergies as of 04/27/2024       Reactions  Trulicity  [dulaglutide ] Diarrhea        Medication List        Accurate as of April 27, 2024  9:58 AM. If you have any questions, ask your nurse or doctor.          Accu-Chek Guide Me w/Device Kit Use to check blood sugar 3 times daily.   Accu-Chek Guide Test test strip Generic drug: glucose blood Use as instructed   Accu-Chek Softclix Lancets lancets Use to check blood sugar 3 times daily.   acetaminophen  325 MG tablet Commonly known as: TYLENOL  Take 2 tablets (650 mg total) by mouth every 6 (six) hours as needed for mild pain (or Fever >/= 101).   apixaban  5 MG Tabs tablet Commonly known as: ELIQUIS  Take 1 tablet (5 mg total) by mouth in the morning and at bedtime.   aspirin  EC 81 MG tablet Take 1 tablet (81 mg total) by mouth daily. Swallow whole.   atorvastatin  40 MG tablet Commonly known as: LIPITOR Take 1 tablet (40 mg total) by mouth daily.   Blood Pressure Monitor/Arm Devi Check blood pressure once per day at least 2 hours after medications.   brimonidine  0.2 % ophthalmic solution Commonly known as: ALPHAGAN  Instill 1 drop into both eyes twice a day   CENTRUM SILVER  PO Take 1 tablet by mouth daily.   Ocuvite Adult 50+ Caps Take 1 capsule by mouth daily.   Dexcom G7 Sensor Misc Use as directed. Every 10 days   diclofenac  Sodium 1 %  Gel Commonly known as: Voltaren  Apply 2 g topically 4 (four) times daily. Apply across lower back for back pain.   fenofibrate  48 MG tablet Commonly known as: Tricor  Take 1 tablet (48 mg total) by mouth daily.   FeroSul 325 (65 Fe) MG tablet Generic drug: ferrous sulfate  Take 1 tablet (325 mg total) by mouth 2 (two) times a week.   fluticasone  50 MCG/ACT nasal spray Commonly known as: FLONASE  Place 2 sprays into both nostrils daily.   insulin  lispro 100 UNIT/ML injection Commonly known as: HumaLOG  Inject 36 units per day via insulin  pump   Jardiance  25 MG Tabs tablet Generic drug: empagliflozin  Take 1 tablet (25 mg total) by mouth daily before breakfast.   Omnipod 5 DexG7G6 Pods Gen 5 Misc Use every other day.   tirzepatide  10 MG/0.5ML Pen Commonly known as: MOUNJARO  Inject 10 mg into the skin once a week.   torsemide  20 MG tablet Commonly known as: DEMADEX  Take 1 tablet (20 mg) once a day, Take extra dose of 20 mg as needed for sudden weight gain and leg swelling         OBJECTIVE:   Vital Signs: BP 106/70   Ht 5' 3 (1.6 m)   Wt 179 lb (81.2 kg)   BMI 31.71 kg/m   Wt Readings from Last 3 Encounters:  04/27/24 179 lb (81.2 kg)  04/25/24 177 lb 4.8 oz (80.4 kg)  02/16/24 181 lb (82.1 kg)     Exam: General: Pt appears well and is in NAD  Lungs: Clear with good BS bilat   Heart: RRR  Extremities: No pretibial edema.   Neuro: MS is good with appropriate affect, pt is alert and Ox3   DM foot exam: 04/27/2024   The skin of the feet is intact without sores or ulcerations,with thickened yellow toe nail The pedal pulses are 1+  The sensation is absent   to a screening 5.07, 10 gram monofilament on the left  DATA REVIEWED:  Lab Results  Component Value Date   HGBA1C 7.7 (A) 04/27/2024   HGBA1C 7.6 (A) 12/25/2023   HGBA1C 7.5 (A) 09/22/2023      ASSESSMENT / PLAN / RECOMMENDATIONS:   1) Type 2 Diabetes Mellitus, suboptimally controlled, with  neuropathic and macrovascular complications - Most recent A1c of 7.7 %   -A1c is stable but continues to be above goal -She is intolerant to Trulicity   -Intolerant to pioglitazone  due to lower extremity edema - The patient has lost approximately 8 LBS over the past 3 months, we have opted to remain on the current dose of Mounjaro  to avoid rapid weight loss - Patient was advised to increase carbohydrate entry with meals as she has been noted with hyperglycemia during the day - She was provided with #1 Dexcom sensor, as her other 1 follow-up sooner while having her BP checked at another clinic  MEDICATIONS:  -Continue Jardiace 25 mg daily  - Continue Mounjar 10 mg weekly    Pump   OmniPod Settings   Insulin  type   Humalog    Basal rate       0000 0.55 u/h               I:C ratio       0000 1:1    Enter 3 g with each meal               Sensitivity       0000  40      Goal       0000  120           EDUCATION / INSTRUCTIONS: BG monitoring instructions: Patient is instructed to check her blood sugars 3 times a day, before meals. I reviewed the Rule of 15 for the treatment of hypoglycemia in detail with the patient. Literature supplied.    2) Diabetic complications:  Eye: Does not have known diabetic retinopathy.  Neuro/ Feet: Does have known diabetic peripheral neuropathy. Renal: Patient does have known baseline CKD. She is not on an ACEI/ARB at present.   3) Dyslipidemia :  -Patient on atorvastatin  40 mg daily   F/U in 6 months   Signed electronically by: Stefano Redgie Butts, MD  [facility] Endocrinology  [facility] [address] [place] [place], [place] [address] Phone: [phone] FAX: [phone]   CC: Vicci Barnie NOVAK, MD [address] [place] [address] [place] [address] Phone: [phone]  Fax: [phone]  Return to Endocrinology clinic as below: Future Appointments  Date Time Provider Department Center  04/27/2024  10:50 AM Mahogany Torrance, Donell Redgie, MD [id number] None  06/17/2024 10:30 AM Vicci Barnie NOVAK, MD [facility]

## 2024-05-10 DIAGNOSIS — E113511 Type 2 diabetes mellitus with proliferative diabetic retinopathy with macular edema, right eye: Secondary | ICD-10-CM | POA: Diagnosis not present

## 2024-05-10 DIAGNOSIS — H35033 Hypertensive retinopathy, bilateral: Secondary | ICD-10-CM | POA: Diagnosis not present

## 2024-05-10 DIAGNOSIS — H43813 Vitreous degeneration, bilateral: Secondary | ICD-10-CM | POA: Diagnosis not present

## 2024-05-17 DIAGNOSIS — H353124 Nonexudative age-related macular degeneration, left eye, advanced atrophic with subfoveal involvement: Secondary | ICD-10-CM | POA: Diagnosis not present

## 2024-05-17 DIAGNOSIS — E113512 Type 2 diabetes mellitus with proliferative diabetic retinopathy with macular edema, left eye: Secondary | ICD-10-CM | POA: Diagnosis not present

## 2024-05-17 DIAGNOSIS — H353112 Nonexudative age-related macular degeneration, right eye, intermediate dry stage: Secondary | ICD-10-CM | POA: Diagnosis not present

## 2024-05-17 DIAGNOSIS — H35033 Hypertensive retinopathy, bilateral: Secondary | ICD-10-CM | POA: Diagnosis not present

## 2024-05-17 DIAGNOSIS — H43813 Vitreous degeneration, bilateral: Secondary | ICD-10-CM | POA: Diagnosis not present

## 2024-05-25 ENCOUNTER — Other Ambulatory Visit: Payer: Self-pay

## 2024-05-25 ENCOUNTER — Other Ambulatory Visit: Payer: Self-pay | Admitting: Physician Assistant

## 2024-05-25 ENCOUNTER — Other Ambulatory Visit (HOSPITAL_COMMUNITY): Payer: Self-pay

## 2024-05-25 DIAGNOSIS — E1165 Type 2 diabetes mellitus with hyperglycemia: Secondary | ICD-10-CM

## 2024-05-25 MED ORDER — ACCU-CHEK GUIDE TEST VI STRP
ORAL_STRIP | 6 refills | Status: AC
  Filled 2024-05-25: qty 100, 33d supply, fill #0

## 2024-05-26 ENCOUNTER — Other Ambulatory Visit: Payer: Self-pay

## 2024-05-28 ENCOUNTER — Other Ambulatory Visit (HOSPITAL_COMMUNITY): Payer: Self-pay

## 2024-06-01 ENCOUNTER — Other Ambulatory Visit (HOSPITAL_COMMUNITY): Payer: Self-pay

## 2024-06-01 ENCOUNTER — Other Ambulatory Visit: Payer: Self-pay

## 2024-06-02 ENCOUNTER — Other Ambulatory Visit: Payer: Self-pay | Admitting: Internal Medicine

## 2024-06-03 ENCOUNTER — Other Ambulatory Visit: Payer: Self-pay

## 2024-06-06 ENCOUNTER — Other Ambulatory Visit: Payer: Self-pay

## 2024-06-06 ENCOUNTER — Other Ambulatory Visit: Payer: Self-pay | Admitting: Internal Medicine

## 2024-06-17 ENCOUNTER — Encounter: Payer: Self-pay | Admitting: Internal Medicine

## 2024-06-17 ENCOUNTER — Ambulatory Visit: Attending: Internal Medicine | Admitting: Internal Medicine

## 2024-06-17 VITALS — BP 126/78 | HR 87 | Temp 97.7°F | Ht 63.0 in | Wt 171.0 lb

## 2024-06-17 DIAGNOSIS — E1122 Type 2 diabetes mellitus with diabetic chronic kidney disease: Secondary | ICD-10-CM

## 2024-06-17 DIAGNOSIS — Z7984 Long term (current) use of oral hypoglycemic drugs: Secondary | ICD-10-CM

## 2024-06-17 DIAGNOSIS — I2510 Atherosclerotic heart disease of native coronary artery without angina pectoris: Secondary | ICD-10-CM | POA: Diagnosis not present

## 2024-06-17 DIAGNOSIS — E113513 Type 2 diabetes mellitus with proliferative diabetic retinopathy with macular edema, bilateral: Secondary | ICD-10-CM

## 2024-06-17 DIAGNOSIS — E782 Mixed hyperlipidemia: Secondary | ICD-10-CM

## 2024-06-17 DIAGNOSIS — E083513 Diabetes mellitus due to underlying condition with proliferative diabetic retinopathy with macular edema, bilateral: Secondary | ICD-10-CM

## 2024-06-17 DIAGNOSIS — I480 Paroxysmal atrial fibrillation: Secondary | ICD-10-CM

## 2024-06-17 DIAGNOSIS — Z794 Long term (current) use of insulin: Secondary | ICD-10-CM

## 2024-06-17 DIAGNOSIS — E1169 Type 2 diabetes mellitus with other specified complication: Secondary | ICD-10-CM

## 2024-06-17 DIAGNOSIS — N1832 Chronic kidney disease, stage 3b: Secondary | ICD-10-CM

## 2024-06-17 DIAGNOSIS — I5032 Chronic diastolic (congestive) heart failure: Secondary | ICD-10-CM | POA: Diagnosis not present

## 2024-06-17 DIAGNOSIS — Z862 Personal history of diseases of the blood and blood-forming organs and certain disorders involving the immune mechanism: Secondary | ICD-10-CM

## 2024-06-17 DIAGNOSIS — Z7985 Long-term (current) use of injectable non-insulin antidiabetic drugs: Secondary | ICD-10-CM | POA: Diagnosis not present

## 2024-06-17 DIAGNOSIS — E119 Type 2 diabetes mellitus without complications: Secondary | ICD-10-CM

## 2024-06-17 MED ORDER — TIRZEPATIDE 10 MG/0.5ML ~~LOC~~ SOAJ: 10.0000 mg | SUBCUTANEOUS | 6 refills | Status: AC

## 2024-06-17 NOTE — Patient Instructions (Signed)
" °  VISIT SUMMARY: You came in today for your three-month follow-up visit. We reviewed your heart disease, diabetes, and stage 3 kidney disease. You reported some swelling in your left foot after walking and occasional left-sided chest pain when pulling yourself up. We also discussed your diabetes management, including your recent A1c level and eye treatments for retinopathy. Your anemia has improved, and you are up to date with your COVID vaccinations.  YOUR PLAN: -TYPE 2 DIABETES MELLITUS WITH STAGE 3 CHRONIC KIDNEY DISEASE AND PROLIFERATIVE DIABETIC RETINOPATHY: Type 2 diabetes is a condition where your body does not use insulin  properly, leading to high blood sugar levels. Your A1c is currently 7.7%, and our goal is to get it below 7%. Your kidney function is stable with a GFR between 33 to 40, and your eye condition is improving with injections. Continue taking Mounjaro  10 mg, Jardiance  25 mg daily, and using your insulin  pump with Humalog . Keep up with healthy eating and regular physical activity, and follow up with your endocrinologist for diabetes management.  -CHRONIC DIASTOLIC HEART FAILURE: Chronic diastolic heart failure means your heart has difficulty relaxing and filling with blood. You reported swelling in your left foot after walking. Continue taking torsemide  20 mg daily to help manage this symptom.  -PAROXYSMAL ATRIAL FIBRILLATION: Paroxysmal atrial fibrillation is a condition where your heart occasionally beats irregularly. You have not had recent palpitations, but you do experience occasional left chest pain, possibly related to activity. Continue taking Eliquis  5 mg twice daily.  -CORONARY ARTERY DISEASE: Coronary artery disease is a condition where the blood vessels supplying your heart are narrowed or blocked. You have not needed to use nitroglycerin  recently. Continue taking atorvastatin  40 mg daily and aspirin  81 mg daily.  -GENERAL HEALTH MAINTENANCE: You are up to date with your  health maintenance, including receiving your COVID vaccine on October 3rd, 2024. Continue with your routine health maintenance.  INSTRUCTIONS: Please follow up with your new kidney specialist on February 27th. Continue to monitor your blood sugar levels and maintain your current medications and lifestyle habits. If you experience any new or worsening symptoms, please contact our office.                      Contains text generated by Abridge.                                 Contains text generated by Abridge.   "

## 2024-06-17 NOTE — Progress Notes (Signed)
 "   Patient ID: Emily Farmer, female    DOB: 1955/08/08  MRN: [id number]  CC: Diabetes (DM f/u. Med refill./Requesting rx for lancets/Already received flu vax)   Subjective: Emily Farmer is a 69 y.o. female who presents for chronic ds management. 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  History of Present Illness Emily Farmer is a 69 year old female with heart disease, diabetes, and stage 3 kidney disease who presents for a three-month follow-up visit.  HTN/CHFpEF/PAF/CAD/HL: She has heart disease and is currently taking Eliquis  5 mg twice daily, atorvastatin  40 mg daily, fenofibrate  48 mg daily, aspirin  81 mg daily, and torsemide  20 mg daily. She experiences swelling in her left foot after walking, causing her shoe to feel tight. Occasionally, she has left-sided chest pain when pulling herself up. No chest pain, shortness of breath, heart racing, or palpitations otherwise.  CKD 3b: GFR between 33 to 40. No NSAIDS. Followed by nephrology. She has an upcoming appointment with a new kidney specialist on February 27th, as her previous doctor left the practice.  DM: For her diabetes, she has proliferative retinopathy, follwed by retinal specialist and receives eye injections every 5 to 8 weeks, alternating between the left and right eyes. Her retina specialist told her that the treatment is helping a little bit.  Followed by Dr. Kellie whom she  saw in Nov. Her last A1c was 7.7 in November, and she is on Mounjaro  10 mg, Jardiance  25 mg daily, and uses an insulin  pump with Humalog  insulin . She also uses a continuous glucose monitor. She is managing her eating habits, avoiding sugary snacks and drinks, and walks for about 30 minutes daily when getting the mail.  Her anemia has improved, with her hemoglobin level around 14, and she has stopped taking iron supplements as advised  HM: She received a COVID vaccine on October 3rd at [facility] pharmacy.    Patient Active Problem List   Diagnosis Date Noted   Type 2 diabetes mellitus with hyperglycemia, with long-term current use of insulin  (HCC) 06/24/2023   Diabetes mellitus (HCC) 06/24/2023   Permanent atrial fibrillation (HCC) 03/17/2023   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/2023   Coronary artery disease 03/09/2023   Coronary artery disease involving native coronary artery of native heart with unstable angina pectoris (HCC) 03/05/2023   Accelerating angina (HCC) 03/01/2023   Stage 3b chronic kidney disease (HCC) 11/18/2022   Proliferative diabetic retinopathy of both eyes (HCC) 10/29/2022   Peripheral arterial disease 10/14/2022   Polyneuropathy associated with underlying disease 08/22/2022   Type 2 diabetes mellitus with peripheral neuropathy (HCC) 08/22/2022   Pacemaker 06/03/2022   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   Bacteremia 04/01/2021   Atrial fibrillation with slow ventricular response (HCC) 03/31/2021   Pulmonary hypertension (HCC) 03/19/2021   Hypomagnesemia    Sepsis (HCC) 02/27/2021   Atrial fibrillation with rapid ventricular response (HCC) 02/27/2021   Hypotension 02/27/2021   Secondary hypercoagulable state 05/08/2020   Dyslipidemia 09/23/2019   Toenail fungus 07/19/2018  Depression 07/19/2018   Iron deficiency anemia due to chronic blood loss 07/19/2018   Gastritis and duodenitis 07/19/2018   Chronic a-fib (HCC) 01/28/2018   Hyponatremia 01/28/2018   Chronic  diastolic CHF (congestive heart failure) (HCC) 01/28/2018   Mixed hyperlipidemia 06/01/2017   Paroxysmal A-fib (HCC) 10/04/2016   Hypokalemia 01/04/2014   Acute pyelonephritis 02/15/2013   Essential hypertension 02/15/2013   Hypertriglyceridemia 02/15/2013   Coronary atherosclerosis seen on CT 02/15/2013     Medications Ordered Prior to Encounter[1]  Allergies[2]  Social History   Socioeconomic History   Marital status: Single    Spouse name: Not on file   Number of children: Not on file   Years of education: Not on file   Highest education level: Not on file  Occupational History   Not on file  Tobacco Use   Smoking status: Never   Smokeless tobacco: Never  Vaping Use   Vaping status: Never Used  Substance and Sexual Activity   Alcohol  use: No   Drug use: No   Sexual activity: Not Currently  Other Topics Concern   Not on file  Social History Narrative   Lives with her sister and does not need any assistance with ADLs.     Social Drivers of Health   Tobacco Use: Low Risk (04/27/2024)   Patient History    Smoking Tobacco Use: Never    Smokeless Tobacco Use: Never    Passive Exposure: Not on file  Financial Resource Strain: Medium Risk (12/08/2023)   Overall Financial Resource Strain (CARDIA)    Difficulty of Paying Living Expenses: Somewhat hard  Food Insecurity: Food Insecurity Present (12/08/2023)   Epic    Worried About Programme Researcher, Broadcasting/film/video in the Last Year: Sometimes true    Ran Out of Food in the Last Year: Often true  Transportation Needs: Unmet Transportation Needs (12/08/2023)   Epic    Lack of Transportation (Medical): No    Lack of Transportation (Non-Medical): Yes  Physical Activity: Sufficiently Active (12/08/2023)   Exercise Vital Sign    Days of Exercise per Week: 5 days    Minutes of Exercise per Session: 30 min  Stress: No Stress Concern Present (12/08/2023)   [employer] of Occupational Health - Occupational Stress Questionnaire    Feeling  of Stress: Not at all  Social Connections: Socially Isolated (12/08/2023)   Social Connection and Isolation Panel    Frequency of Communication with Friends and Family: Once a week    Frequency of Social Gatherings with Friends and Family: Once a week    Attends Religious Services: Never    Database Administrator or Organizations: No    Attends Banker Meetings: Never    Marital Status: Never married  Intimate Partner Violence: Not At Risk (12/08/2023)   Epic    Fear of Current or Ex-Partner: No    Emotionally Abused: No    Physically Abused: No    Sexually Abused: No  Depression (PHQ2-9): Low Risk (02/16/2024)   Depression (PHQ2-9)    PHQ-2 Score: 3  Recent Concern: Depression (PHQ2-9) - Medium Risk (12/08/2023)   Depression (PHQ2-9)    PHQ-2 Score: 5  Alcohol  Screen: Low Risk (12/08/2023)   Alcohol  Screen    Last Alcohol  Screening Score (AUDIT): 0  Housing: Low Risk (12/08/2023)   Epic    Unable to Pay for Housing in the Last Year: No    Number of Times Moved in the Last Year: 0    Homeless  in the Last Year: No  Utilities: Not At Risk (12/08/2023)   Epic    Threatened with loss of utilities: No  Health Literacy: Adequate Health Literacy (12/08/2023)   B1300 Health Literacy    Frequency of need for help with medical instructions: Never    Family History  Problem Relation Age of Onset   Heart attack Mother    Heart attack Father    Diabetes Sister    High blood pressure Neg Hx    High Cholesterol Neg Hx     Past Surgical History:  Procedure Laterality Date   AV NODE ABLATION N/A 01/02/2023   Procedure: AV NODE ABLATION;  Surgeon: Waddell Danelle ORN, MD;  Location: [facility] INVASIVE CV LAB;  Service: Cardiovascular;  Laterality: N/A;   BIOPSY  06/20/2018   Procedure: BIOPSY;  Surgeon: Rosalie Kitchens, MD;  Location: [facility] ENDOSCOPY;  Service: Endoscopy;;   CHOLECYSTECTOMY N/A 01/04/2014   Procedure: LAPAROSCOPIC CHOLECYSTECTOMY WITH INTRAOPERATIVE CHOLANGIOGRAM;  Surgeon: Lynda Leos, MD;  Location: [facility] OR;  Service: General;  Laterality: N/A;   COLONOSCOPY WITH PROPOFOL  N/A 06/21/2018   Procedure: COLONOSCOPY WITH PROPOFOL ;  Surgeon: Donnald Charleston, MD;  Location: [facility] ENDOSCOPY;  Service: Endoscopy;  Laterality: N/A;   CORONARY ARTERY BYPASS GRAFT N/A 03/09/2023   Procedure: CORONARY ARTERY BYPASS GRAFTING (CABG) TIMES THREE USING THE LEFT INTERNAL MAMMARY ARTERY (LIMA) AND ENDOSCOPICALLY HARVESTED RIGHT GREATER SAPHENOUS VEIN;  Surgeon: Lucas Dorise POUR, MD;  Location: [facility] OR;  Service: Open Heart Surgery;  Laterality: N/A;   ESOPHAGOGASTRODUODENOSCOPY (EGD) WITH PROPOFOL  N/A 06/20/2018   Procedure: ESOPHAGOGASTRODUODENOSCOPY (EGD) WITH PROPOFOL ;  Surgeon: Rosalie Kitchens, MD;  Location: [facility] ENDOSCOPY;  Service: Endoscopy;  Laterality: N/A;   IR THORACENTESIS ASP PLEURAL SPACE W/IMG GUIDE  03/16/2023   LUNG SURGERY     PACEMAKER IMPLANT N/A 03/03/2022   Procedure: PACEMAKER IMPLANT;  Surgeon: Waddell Danelle ORN, MD;  Location: [facility] INVASIVE CV LAB;  Service: Cardiovascular;  Laterality: N/A;   RIGHT HEART CATH N/A 03/06/2023   Procedure: RIGHT HEART CATH;  Surgeon: Rolan Ezra RAMAN, MD;  Location: [facility] INVASIVE CV LAB;  Service: Cardiovascular;  Laterality: N/A;   RIGHT/LEFT HEART CATH AND CORONARY ANGIOGRAPHY N/A 03/03/2023   Procedure: RIGHT/LEFT HEART CATH AND CORONARY ANGIOGRAPHY;  Surgeon: Anner Alm ORN, MD;  Location: [facility] INVASIVE CV LAB;  Service: Cardiovascular;  Laterality: N/A;   TEE WITHOUT CARDIOVERSION N/A 03/09/2023   Procedure: TRANSESOPHAGEAL ECHOCARDIOGRAM;  Surgeon: Lucas Dorise POUR, MD;  Location: [facility] OR;  Service: Open Heart Surgery;  Laterality: N/A;    ROS: Review of Systems Negative except as stated above  PHYSICAL EXAM: BP 126/78 (BP Location: Left Arm, Patient Position: Sitting, Cuff Size: Normal)   Pulse 87   Temp 97.7 F (36.5 C) (Oral)   Ht 5' 3 (1.6 m)   Wt 171 lb (77.6 kg)   SpO2 94%   BMI 30.29 kg/m   Physical Exam  General appearance - alert,  well appearing, and in no distress Mental status - normal mood, behavior, speech, dress, motor activity, and thought processes Neck - supple, no significant adenopathy Chest - clear to auscultation, no wheezes, rales or rhonchi, symmetric air entry Heart - Sounds to be in SR with regular rate and rhythm Musculoskeletal - ambulates with rollator walker with seat. Gait is slow. Ambulates from chair to table without the walker Extremities - no LE edema      Latest Ref Rng & Units 02/16/2024   11:42 AM 10/06/2023   11:28 AM 07/02/2023  9:40 AM  CMP  Glucose 70 - 99 mg/dL 875  869  838   BUN 8 - 27 mg/dL 44  59  63   Creatinine 0.57 - 1.00 mg/dL 8.42  8.30  8.70   Sodium 134 - 144 mmol/L 138  138  139   Potassium 3.5 - 5.2 mmol/L 4.1  3.7  3.7   Chloride 96 - 106 mmol/L 98  97  97   CO2 20 - 29 mmol/L 21  24  27    Calcium  8.7 - 10.3 mg/dL 89.8  9.8  9.7   Total Protein 6.0 - 8.5 g/dL 7.1     Total Bilirubin 0.0 - 1.2 mg/dL 0.3     Alkaline Phos 44 - 121 IU/L 58     AST 0 - 40 IU/L 21     ALT 0 - 32 IU/L 17      Lipid Panel     Component Value Date/Time   CHOL 95 03/02/2023 0422   CHOL 351 (H) 11/18/2022 1216   TRIG 121 03/02/2023 0422   HDL 39 (L) 03/02/2023 0422   HDL 31 (L) 11/18/2022 1216   CHOLHDL 2.4 03/02/2023 0422   VLDL 24 03/02/2023 0422   LDLCALC 32 03/02/2023 0422   LDLCALC Comment (A) 11/18/2022 1216   LDLDIRECT 137 (H) 03/01/2020 1012   LDLDIRECT 96.0 09/23/2019 0828    CBC    Component Value Date/Time   WBC 9.6 02/16/2024 1142   WBC 9.4 05/13/2023 2110   RBC 5.04 02/16/2024 1142   RBC 4.40 05/13/2023 2110   HGB 14.6 02/16/2024 1142   HCT 45.0 02/16/2024 1142   PLT 282 02/16/2024 1142   MCV 89 02/16/2024 1142   MCH 29.0 02/16/2024 1142   MCH 26.4 05/13/2023 2110   MCHC 32.4 02/16/2024 1142   MCHC 31.1 05/13/2023 2110   RDW 14.4 02/16/2024 1142   LYMPHSABS 1.6 05/13/2023 2110   LYMPHSABS 1.9 04/11/2021 1447   MONOABS 0.8 05/13/2023 2110   EOSABS  0.6 (H) 05/13/2023 2110   EOSABS 0.4 04/11/2021 1447   BASOSABS 0.0 05/13/2023 2110   BASOSABS 0.0 04/11/2021 1447    ASSESSMENT AND PLAN: 1. Type 2 diabetes mellitus with stage 3b chronic kidney disease, with long-term current use of insulin  (HCC) (Primary) 2. Proliferative diabetic retinopathy of both eyes with macular edema associated with diabetes mellitus due to underlying condition (HCC) 3. Diabetes mellitus treated with oral medication (HCC) 4. Long-term (current) use of injectable non-insulin  antidiabetic drugs -Patient is plugged in with retinal specialist and continues to receive treatment for her retinopathy. -She is plugged in with endocrinologist Dr. Kellie.  She will continue Jardiance  25 mg daily, Mounjaro  10 mg once a week and insulin  pump.  Encouraged her to continue healthy eating habits and trying to move as much as she can. - tirzepatide  (MOUNJARO ) 10 MG/0.5ML Pen; Inject 10 mg into the skin once a week.  Dispense: 6 mL; Refill: 6  5. Chronic diastolic CHF (congestive heart failure) (HCC) Stable and compensated. Continue torsemide .   6. PAF (paroxysmal atrial fibrillation) (HCC) Continue Eliquis .  She reports no bruising or bleeding on the medication.  Hemoglobin has stabilized around 14.  7. Coronary artery disease involving native coronary artery of native heart without angina pectoris Stable.  Continue baby aspirin , atorvastatin  40 mg daily fenofibrate  40 mg daily.  Not on beta-blocker due to history of symptomatic bradycardia.   8. Mixed hyperlipidemia due to type 2 diabetes mellitus (HCC) See #7 Due for  lipid profile but I forgot to order today. Plan to get on next visit 9. History of anemia Hb normalized. Stop Iron supplement    Patient was given the opportunity to ask questions.  Patient verbalized understanding of the plan and was able to repeat key elements of the plan.   This documentation was completed using Paediatric nurse.   Any transcriptional errors are unintentional.  No orders of the defined types were placed in this encounter.    Requested Prescriptions   Signed Prescriptions Disp Refills   tirzepatide  (MOUNJARO ) 10 MG/0.5ML Pen 6 mL 6    Sig: Inject 10 mg into the skin once a week.    Return in about 4 months (around 10/15/2024).  Barnie Louder, MD, FACP     [1]  Current Outpatient Medications on File Prior to Visit  Medication Sig Dispense Refill   Accu-Chek Softclix Lancets lancets Use to check blood sugar 3 times daily. 100 each 3   acetaminophen  (TYLENOL ) 325 MG tablet Take 2 tablets (650 mg total) by mouth every 6 (six) hours as needed for mild pain (or Fever >/= 101).     aspirin  EC 81 MG tablet Take 1 tablet (81 mg total) by mouth daily. Swallow whole.     atorvastatin  (LIPITOR) 40 MG tablet Take 1 tablet (40 mg total) by mouth daily. 90 tablet 3   Blood Glucose Monitoring Suppl (ACCU-CHEK GUIDE) w/Device KIT Use to check blood sugar 3 times daily. 1 kit 0   Blood Pressure Monitoring (BLOOD PRESSURE MONITOR/ARM) DEVI Check blood pressure once per day at least 2 hours after medications. 1 each 0   brimonidine  (ALPHAGAN ) 0.2 % ophthalmic solution Instill 1 drop into both eyes twice a day 180 mL 3   Continuous Glucose Sensor (DEXCOM G7 SENSOR) MISC Use as directed. Every 10 days 9 each 3   diclofenac  Sodium (VOLTAREN ) 1 % GEL Apply 2 g topically 4 (four) times daily. Apply across lower back for back pain. 100 g 0   ELIQUIS  5 MG TABS tablet TAKE 1 TABLET IN THE MORNING AND AT BEDTIME. 180 tablet 3   empagliflozin  (JARDIANCE ) 25 MG TABS tablet Take 1 tablet (25 mg total) by mouth daily before breakfast. 90 tablet 3   fenofibrate  (TRICOR ) 48 MG tablet Take 1 tablet (48 mg total) by mouth daily. 90 tablet 1   fluticasone  (FLONASE ) 50 MCG/ACT nasal spray USE 2 SPRAYS IN EACH NOSTRIL EVERY DAY 48 g 3   glucose blood (ACCU-CHEK GUIDE TEST) test strip Use to check blood sugar three times daily. 100  each 6   Insulin  Disposable Pump (OMNIPOD 5 G7 PODS, GEN 5,) MISC Use every other day. 45 each 3   insulin  lispro (HUMALOG ) 100 UNIT/ML injection Inject 36 units per day via insulin  pump 30 mL 3   Multiple Vitamins-Minerals (CENTRUM SILVER  PO) Take 1 tablet by mouth daily.     Multiple Vitamins-Minerals (OCUVITE ADULT 50+) CAPS Take 1 capsule by mouth daily.     torsemide  (DEMADEX ) 20 MG tablet Take 1 tablet (20 mg) once a day, Take extra dose of 20 mg as needed for sudden weight gain and leg swelling 90 tablet 3   No current facility-administered medications on file prior to visit.  [2]  Allergies Allergen Reactions   Trulicity  [Dulaglutide ] Diarrhea   "

## 2024-06-20 ENCOUNTER — Other Ambulatory Visit: Payer: Self-pay

## 2024-07-01 ENCOUNTER — Other Ambulatory Visit: Payer: Self-pay

## 2024-07-01 MED ORDER — BRIMONIDINE TARTRATE 0.2 % OP SOLN
1.0000 [drp] | Freq: Two times a day (BID) | OPHTHALMIC | 11 refills | Status: AC
  Filled 2024-07-20: qty 10, 100d supply, fill #0
  Filled 2024-07-20: qty 15, 75d supply, fill #0

## 2024-07-08 ENCOUNTER — Other Ambulatory Visit: Payer: Self-pay

## 2024-07-11 ENCOUNTER — Ambulatory Visit: Admitting: Internal Medicine

## 2024-07-14 ENCOUNTER — Telehealth: Payer: Self-pay | Admitting: Dietician

## 2024-07-14 NOTE — Telephone Encounter (Signed)
 Patient called and stated that she is having problems getting an update for her Omnipod 5 pump due to wifi issues.  She has called tech support but she call/wifi disconnected.  She requests to meet with [facility] to help with this.  Appointment has been made.  Leita Constable, RD, LDN, CDCES, DipACLM

## 2024-07-15 ENCOUNTER — Other Ambulatory Visit: Payer: Self-pay

## 2024-07-20 ENCOUNTER — Other Ambulatory Visit: Payer: Self-pay

## 2024-07-20 ENCOUNTER — Ambulatory Visit: Admitting: Internal Medicine

## 2024-07-20 ENCOUNTER — Other Ambulatory Visit (HOSPITAL_COMMUNITY): Payer: Self-pay

## 2024-07-21 ENCOUNTER — Ambulatory Visit: Admitting: Internal Medicine

## 2024-07-25 ENCOUNTER — Encounter: Admitting: Nutrition

## 2024-09-29 ENCOUNTER — Ambulatory Visit: Admitting: Internal Medicine

## 2024-09-30 ENCOUNTER — Ambulatory Visit: Admitting: Internal Medicine

## 2024-10-20 ENCOUNTER — Ambulatory Visit: Payer: Self-pay | Admitting: Internal Medicine

## 2024-10-26 ENCOUNTER — Ambulatory Visit: Admitting: Internal Medicine
